# Patient Record
Sex: Male | Born: 1935
Health system: Southern US, Community
[De-identification: ages and names within clinical notes are randomized; demographics above are authoritative.]

## PROBLEM LIST (undated history)

## (undated) DIAGNOSIS — H3530 Unspecified macular degeneration: Secondary | ICD-10-CM

## (undated) DIAGNOSIS — N183 Chronic kidney disease, stage 3 unspecified: Secondary | ICD-10-CM

## (undated) DIAGNOSIS — E039 Hypothyroidism, unspecified: Secondary | ICD-10-CM

## (undated) DIAGNOSIS — J45909 Unspecified asthma, uncomplicated: Secondary | ICD-10-CM

## (undated) DIAGNOSIS — Z85828 Personal history of other malignant neoplasm of skin: Secondary | ICD-10-CM

## (undated) DIAGNOSIS — Z87442 Personal history of urinary calculi: Secondary | ICD-10-CM

## (undated) DIAGNOSIS — I639 Cerebral infarction, unspecified: Secondary | ICD-10-CM

## (undated) DIAGNOSIS — G20A1 Parkinson's disease without dyskinesia, without mention of fluctuations: Secondary | ICD-10-CM

## (undated) DIAGNOSIS — M1990 Unspecified osteoarthritis, unspecified site: Secondary | ICD-10-CM

## (undated) DIAGNOSIS — E119 Type 2 diabetes mellitus without complications: Secondary | ICD-10-CM

## (undated) DIAGNOSIS — K219 Gastro-esophageal reflux disease without esophagitis: Secondary | ICD-10-CM

## (undated) DIAGNOSIS — H9190 Unspecified hearing loss, unspecified ear: Secondary | ICD-10-CM

## (undated) DIAGNOSIS — Z789 Other specified health status: Secondary | ICD-10-CM

## (undated) DIAGNOSIS — G20 Parkinson's disease: Secondary | ICD-10-CM

## (undated) DIAGNOSIS — I480 Paroxysmal atrial fibrillation: Secondary | ICD-10-CM

## (undated) DIAGNOSIS — IMO0001 Reserved for inherently not codable concepts without codable children: Secondary | ICD-10-CM

## (undated) DIAGNOSIS — I2510 Atherosclerotic heart disease of native coronary artery without angina pectoris: Secondary | ICD-10-CM

## (undated) DIAGNOSIS — E785 Hyperlipidemia, unspecified: Secondary | ICD-10-CM

## (undated) DIAGNOSIS — N3281 Overactive bladder: Secondary | ICD-10-CM

## (undated) DIAGNOSIS — C61 Malignant neoplasm of prostate: Secondary | ICD-10-CM

## (undated) DIAGNOSIS — I824Y9 Acute embolism and thrombosis of unspecified deep veins of unspecified proximal lower extremity: Secondary | ICD-10-CM

## (undated) DIAGNOSIS — D649 Anemia, unspecified: Secondary | ICD-10-CM

## (undated) DIAGNOSIS — I10 Essential (primary) hypertension: Secondary | ICD-10-CM

## (undated) HISTORY — DX: Personal history of other malignant neoplasm of skin: Z85.828

## (undated) HISTORY — DX: Hypothyroidism, unspecified: E03.9

## (undated) HISTORY — PX: HERNIA REPAIR: SHX51

## (undated) HISTORY — DX: Type 2 diabetes mellitus without complications: E11.9

## (undated) HISTORY — DX: Chronic kidney disease, stage 3 (moderate): N18.3

## (undated) HISTORY — PX: LUNG REMOVAL, PARTIAL: SHX233

## (undated) HISTORY — DX: Cerebral infarction, unspecified: I63.9

## (undated) HISTORY — PX: COLONOSCOPY W/ POLYPECTOMY: SHX1380

## (undated) HISTORY — DX: Chronic kidney disease, stage 3 unspecified: N18.30

## (undated) HISTORY — DX: Unspecified asthma, uncomplicated: J45.909

## (undated) HISTORY — DX: Paroxysmal atrial fibrillation: I48.0

## (undated) HISTORY — DX: Hyperlipidemia, unspecified: E78.5

## (undated) HISTORY — PX: BACK SURGERY: SHX140

## (undated) HISTORY — DX: Unspecified macular degeneration: H35.30

## (undated) HISTORY — DX: Malignant neoplasm of prostate: C61

## (undated) HISTORY — PX: OTHER SURGICAL HISTORY: SHX169

## (undated) HISTORY — PX: CYSTOSCOPY WITH RETROGRADE PYELOGRAM, URETEROSCOPY AND STENT PLACEMENT: SHX5789

## (undated) HISTORY — PX: EYE SURGERY: SHX253

---

## 1983-01-13 HISTORY — PX: PARTIAL THYMECTOMY: SHX2177

## 1997-07-04 ENCOUNTER — Ambulatory Visit (HOSPITAL_BASED_OUTPATIENT_CLINIC_OR_DEPARTMENT_OTHER): Admission: RE | Admit: 1997-07-04 | Discharge: 1997-07-04 | Payer: Self-pay | Admitting: *Deleted

## 1997-07-31 ENCOUNTER — Ambulatory Visit (HOSPITAL_COMMUNITY): Admission: RE | Admit: 1997-07-31 | Discharge: 1997-07-31 | Payer: Self-pay | Admitting: *Deleted

## 1998-01-30 ENCOUNTER — Ambulatory Visit (HOSPITAL_COMMUNITY): Admission: RE | Admit: 1998-01-30 | Discharge: 1998-01-30 | Payer: Self-pay | Admitting: Specialist

## 1999-03-18 ENCOUNTER — Encounter: Admission: RE | Admit: 1999-03-18 | Discharge: 1999-03-18 | Payer: Self-pay | Admitting: Orthopedic Surgery

## 1999-03-18 ENCOUNTER — Encounter: Payer: Self-pay | Admitting: Orthopedic Surgery

## 1999-03-19 ENCOUNTER — Encounter: Payer: Self-pay | Admitting: *Deleted

## 1999-03-19 ENCOUNTER — Encounter: Admission: RE | Admit: 1999-03-19 | Discharge: 1999-03-19 | Payer: Self-pay | Admitting: *Deleted

## 1999-11-05 ENCOUNTER — Emergency Department (HOSPITAL_COMMUNITY): Admission: EM | Admit: 1999-11-05 | Discharge: 1999-11-06 | Payer: Self-pay | Admitting: Emergency Medicine

## 2000-08-11 ENCOUNTER — Inpatient Hospital Stay (HOSPITAL_COMMUNITY): Admission: EM | Admit: 2000-08-11 | Discharge: 2000-08-14 | Payer: Self-pay | Admitting: Internal Medicine

## 2000-08-11 ENCOUNTER — Encounter: Payer: Self-pay | Admitting: *Deleted

## 2000-08-13 ENCOUNTER — Encounter: Payer: Self-pay | Admitting: *Deleted

## 2000-12-01 ENCOUNTER — Encounter: Payer: Self-pay | Admitting: Internal Medicine

## 2000-12-02 ENCOUNTER — Encounter: Payer: Self-pay | Admitting: *Deleted

## 2000-12-02 ENCOUNTER — Inpatient Hospital Stay (HOSPITAL_COMMUNITY): Admission: EM | Admit: 2000-12-02 | Discharge: 2000-12-03 | Payer: Self-pay | Admitting: *Deleted

## 2002-11-02 ENCOUNTER — Ambulatory Visit (HOSPITAL_COMMUNITY): Admission: RE | Admit: 2002-11-02 | Discharge: 2002-11-02 | Payer: Self-pay | Admitting: Internal Medicine

## 2002-11-12 ENCOUNTER — Emergency Department (HOSPITAL_COMMUNITY): Admission: EM | Admit: 2002-11-12 | Discharge: 2002-11-12 | Payer: Self-pay | Admitting: Emergency Medicine

## 2003-01-13 HISTORY — PX: POPLITEAL SYNOVIAL CYST EXCISION: SUR555

## 2003-07-11 ENCOUNTER — Encounter (INDEPENDENT_AMBULATORY_CARE_PROVIDER_SITE_OTHER): Payer: Self-pay | Admitting: Specialist

## 2003-07-11 ENCOUNTER — Ambulatory Visit (HOSPITAL_COMMUNITY): Admission: RE | Admit: 2003-07-11 | Discharge: 2003-07-11 | Payer: Self-pay | Admitting: Gastroenterology

## 2003-10-17 ENCOUNTER — Ambulatory Visit (HOSPITAL_COMMUNITY): Admission: RE | Admit: 2003-10-17 | Discharge: 2003-10-17 | Payer: Self-pay | Admitting: Gastroenterology

## 2003-10-17 ENCOUNTER — Encounter (INDEPENDENT_AMBULATORY_CARE_PROVIDER_SITE_OTHER): Payer: Self-pay | Admitting: *Deleted

## 2003-11-27 ENCOUNTER — Ambulatory Visit: Payer: Self-pay | Admitting: Internal Medicine

## 2004-01-13 HISTORY — PX: GALLBLADDER SURGERY: SHX652

## 2004-02-01 ENCOUNTER — Ambulatory Visit: Payer: Self-pay | Admitting: Internal Medicine

## 2004-02-12 ENCOUNTER — Ambulatory Visit: Payer: Self-pay | Admitting: Internal Medicine

## 2004-02-25 ENCOUNTER — Ambulatory Visit (HOSPITAL_COMMUNITY): Admission: RE | Admit: 2004-02-25 | Discharge: 2004-02-25 | Payer: Self-pay | Admitting: Urology

## 2004-03-03 ENCOUNTER — Ambulatory Visit: Payer: Self-pay | Admitting: Internal Medicine

## 2004-04-01 ENCOUNTER — Ambulatory Visit: Payer: Self-pay | Admitting: Internal Medicine

## 2004-04-22 ENCOUNTER — Ambulatory Visit: Payer: Self-pay | Admitting: Internal Medicine

## 2004-05-05 ENCOUNTER — Ambulatory Visit: Payer: Self-pay | Admitting: Internal Medicine

## 2004-06-04 ENCOUNTER — Ambulatory Visit: Payer: Self-pay | Admitting: Internal Medicine

## 2004-07-07 ENCOUNTER — Ambulatory Visit: Payer: Self-pay | Admitting: Internal Medicine

## 2004-07-24 ENCOUNTER — Ambulatory Visit: Payer: Self-pay | Admitting: Internal Medicine

## 2004-08-04 ENCOUNTER — Ambulatory Visit: Payer: Self-pay | Admitting: Internal Medicine

## 2004-09-01 ENCOUNTER — Ambulatory Visit: Payer: Self-pay | Admitting: Internal Medicine

## 2004-10-21 ENCOUNTER — Ambulatory Visit: Payer: Self-pay | Admitting: Internal Medicine

## 2005-02-03 ENCOUNTER — Ambulatory Visit: Payer: Self-pay | Admitting: Internal Medicine

## 2005-02-27 ENCOUNTER — Ambulatory Visit: Payer: Self-pay | Admitting: Internal Medicine

## 2005-03-11 ENCOUNTER — Ambulatory Visit: Payer: Self-pay | Admitting: Internal Medicine

## 2005-06-15 ENCOUNTER — Ambulatory Visit: Payer: Self-pay | Admitting: Internal Medicine

## 2005-06-23 ENCOUNTER — Ambulatory Visit: Payer: Self-pay | Admitting: Internal Medicine

## 2005-08-28 ENCOUNTER — Ambulatory Visit: Admission: RE | Admit: 2005-08-28 | Discharge: 2005-11-26 | Payer: Self-pay | Admitting: Radiation Oncology

## 2005-09-03 ENCOUNTER — Ambulatory Visit: Payer: Self-pay | Admitting: Internal Medicine

## 2005-10-05 ENCOUNTER — Ambulatory Visit: Payer: Self-pay | Admitting: Internal Medicine

## 2005-10-14 ENCOUNTER — Ambulatory Visit: Payer: Self-pay | Admitting: Internal Medicine

## 2005-11-04 ENCOUNTER — Ambulatory Visit: Payer: Self-pay | Admitting: Internal Medicine

## 2005-11-27 ENCOUNTER — Ambulatory Visit: Admission: RE | Admit: 2005-11-27 | Discharge: 2006-02-25 | Payer: Self-pay | Admitting: Radiation Oncology

## 2006-01-12 HISTORY — PX: TOTAL KNEE ARTHROPLASTY: SHX125

## 2006-01-21 ENCOUNTER — Inpatient Hospital Stay (HOSPITAL_COMMUNITY): Admission: RE | Admit: 2006-01-21 | Discharge: 2006-01-25 | Payer: Self-pay | Admitting: Orthopedic Surgery

## 2006-02-13 ENCOUNTER — Encounter: Payer: Self-pay | Admitting: Internal Medicine

## 2006-02-26 ENCOUNTER — Ambulatory Visit: Payer: Self-pay | Admitting: Internal Medicine

## 2006-02-26 LAB — CONVERTED CEMR LAB
Cholesterol: 165 mg/dL (ref 0–200)
HDL: 36.9 mg/dL — ABNORMAL LOW (ref >39.0)
Hgb A1c MFr Bld: 5.2 % (ref 4.6–6.0)
LDL Cholesterol: 108 mg/dL — ABNORMAL HIGH (ref 0–99)
TSH: 14.35 u[IU]/mL — ABNORMAL HIGH (ref 0.35–5.50)
Total CHOL/HDL Ratio: 4.5
Triglycerides: 99 mg/dL (ref 0–149)
VLDL: 20 mg/dL (ref 0–40)

## 2006-03-10 ENCOUNTER — Ambulatory Visit: Payer: Self-pay | Admitting: Internal Medicine

## 2006-04-20 ENCOUNTER — Ambulatory Visit: Payer: Self-pay | Admitting: Internal Medicine

## 2006-04-22 DIAGNOSIS — L509 Urticaria, unspecified: Secondary | ICD-10-CM | POA: Insufficient documentation

## 2006-04-22 DIAGNOSIS — Z9889 Other specified postprocedural states: Secondary | ICD-10-CM

## 2006-04-22 DIAGNOSIS — E119 Type 2 diabetes mellitus without complications: Secondary | ICD-10-CM

## 2006-04-22 DIAGNOSIS — Z794 Long term (current) use of insulin: Secondary | ICD-10-CM

## 2006-04-22 DIAGNOSIS — M7120 Synovial cyst of popliteal space [Baker], unspecified knee: Secondary | ICD-10-CM | POA: Insufficient documentation

## 2006-04-27 ENCOUNTER — Ambulatory Visit: Payer: Self-pay | Admitting: Internal Medicine

## 2006-04-27 LAB — CONVERTED CEMR LAB
AST: 16 units/L (ref 0–37)
BUN: 20 mg/dL (ref 6–23)
Basophils Relative: 0.7 % (ref 0.0–1.0)
Creatinine, Ser: 1 mg/dL (ref 0.4–1.5)
Eosinophils Relative: 2.2 % (ref 0.0–5.0)
HCT: 40.8 % (ref 39.0–52.0)
Platelets: 256 10*3/uL (ref 150–400)
RBC: 4.53 M/uL (ref 4.22–5.81)
RDW: 12 % (ref 11.5–14.6)
TSH: 0.15 microintl units/mL — ABNORMAL LOW (ref 0.35–5.50)
WBC: 8.1 10*3/uL (ref 4.5–10.5)

## 2006-05-06 ENCOUNTER — Ambulatory Visit: Payer: Self-pay | Admitting: Internal Medicine

## 2006-06-14 ENCOUNTER — Ambulatory Visit: Payer: Self-pay | Admitting: Internal Medicine

## 2006-06-15 LAB — CONVERTED CEMR LAB: TSH: 0.05 microintl units/mL — ABNORMAL LOW (ref 0.35–5.50)

## 2006-06-21 ENCOUNTER — Ambulatory Visit: Payer: Self-pay | Admitting: Internal Medicine

## 2006-07-12 ENCOUNTER — Telehealth: Payer: Self-pay | Admitting: Internal Medicine

## 2006-07-12 ENCOUNTER — Encounter (INDEPENDENT_AMBULATORY_CARE_PROVIDER_SITE_OTHER): Payer: Self-pay | Admitting: *Deleted

## 2006-07-16 ENCOUNTER — Encounter: Payer: Self-pay | Admitting: Internal Medicine

## 2006-07-22 ENCOUNTER — Ambulatory Visit: Payer: Self-pay | Admitting: Internal Medicine

## 2006-07-26 ENCOUNTER — Encounter (INDEPENDENT_AMBULATORY_CARE_PROVIDER_SITE_OTHER): Payer: Self-pay | Admitting: *Deleted

## 2006-09-28 ENCOUNTER — Telehealth (INDEPENDENT_AMBULATORY_CARE_PROVIDER_SITE_OTHER): Payer: Self-pay | Admitting: *Deleted

## 2006-09-29 ENCOUNTER — Ambulatory Visit: Payer: Self-pay | Admitting: Internal Medicine

## 2006-10-04 ENCOUNTER — Encounter (INDEPENDENT_AMBULATORY_CARE_PROVIDER_SITE_OTHER): Payer: Self-pay | Admitting: *Deleted

## 2006-10-15 ENCOUNTER — Ambulatory Visit: Payer: Self-pay | Admitting: Internal Medicine

## 2006-10-15 DIAGNOSIS — E039 Hypothyroidism, unspecified: Secondary | ICD-10-CM

## 2006-10-27 ENCOUNTER — Ambulatory Visit: Payer: Self-pay | Admitting: Internal Medicine

## 2006-11-16 ENCOUNTER — Telehealth (INDEPENDENT_AMBULATORY_CARE_PROVIDER_SITE_OTHER): Payer: Self-pay | Admitting: *Deleted

## 2007-01-14 ENCOUNTER — Ambulatory Visit: Payer: Self-pay | Admitting: Internal Medicine

## 2007-01-16 LAB — CONVERTED CEMR LAB: Digitoxin Lvl: 1.1 ng/mL (ref 0.8–2.0)

## 2007-01-17 ENCOUNTER — Ambulatory Visit: Payer: Self-pay | Admitting: Internal Medicine

## 2007-01-17 ENCOUNTER — Encounter (INDEPENDENT_AMBULATORY_CARE_PROVIDER_SITE_OTHER): Payer: Self-pay | Admitting: *Deleted

## 2007-01-18 ENCOUNTER — Encounter (INDEPENDENT_AMBULATORY_CARE_PROVIDER_SITE_OTHER): Payer: Self-pay | Admitting: *Deleted

## 2007-01-18 LAB — CONVERTED CEMR LAB: Hgb A1c MFr Bld: 5.8 %

## 2007-01-21 ENCOUNTER — Ambulatory Visit: Payer: Self-pay | Admitting: Internal Medicine

## 2007-01-27 ENCOUNTER — Telehealth: Payer: Self-pay | Admitting: Internal Medicine

## 2007-02-03 ENCOUNTER — Encounter: Payer: Self-pay | Admitting: Internal Medicine

## 2007-02-15 ENCOUNTER — Encounter: Payer: Self-pay | Admitting: Internal Medicine

## 2007-04-07 ENCOUNTER — Encounter (INDEPENDENT_AMBULATORY_CARE_PROVIDER_SITE_OTHER): Payer: Self-pay | Admitting: General Surgery

## 2007-04-07 ENCOUNTER — Ambulatory Visit (HOSPITAL_COMMUNITY): Admission: RE | Admit: 2007-04-07 | Discharge: 2007-04-07 | Payer: Self-pay | Admitting: General Surgery

## 2007-04-11 ENCOUNTER — Ambulatory Visit: Payer: Self-pay | Admitting: Internal Medicine

## 2007-04-19 ENCOUNTER — Encounter: Payer: Self-pay | Admitting: Internal Medicine

## 2007-05-02 ENCOUNTER — Encounter: Payer: Self-pay | Admitting: Internal Medicine

## 2007-06-02 ENCOUNTER — Telehealth (INDEPENDENT_AMBULATORY_CARE_PROVIDER_SITE_OTHER): Payer: Self-pay | Admitting: *Deleted

## 2007-06-04 ENCOUNTER — Emergency Department (HOSPITAL_COMMUNITY): Admission: EM | Admit: 2007-06-04 | Discharge: 2007-06-04 | Payer: Self-pay | Admitting: Emergency Medicine

## 2007-06-07 ENCOUNTER — Telehealth (INDEPENDENT_AMBULATORY_CARE_PROVIDER_SITE_OTHER): Payer: Self-pay | Admitting: *Deleted

## 2007-06-08 ENCOUNTER — Telehealth (INDEPENDENT_AMBULATORY_CARE_PROVIDER_SITE_OTHER): Payer: Self-pay | Admitting: *Deleted

## 2007-06-13 ENCOUNTER — Telehealth (INDEPENDENT_AMBULATORY_CARE_PROVIDER_SITE_OTHER): Payer: Self-pay | Admitting: *Deleted

## 2007-06-16 ENCOUNTER — Telehealth (INDEPENDENT_AMBULATORY_CARE_PROVIDER_SITE_OTHER): Payer: Self-pay | Admitting: *Deleted

## 2007-06-17 ENCOUNTER — Ambulatory Visit: Payer: Self-pay | Admitting: Internal Medicine

## 2007-06-24 ENCOUNTER — Telehealth: Payer: Self-pay | Admitting: Internal Medicine

## 2007-06-27 ENCOUNTER — Ambulatory Visit: Payer: Self-pay | Admitting: Internal Medicine

## 2007-06-27 ENCOUNTER — Telehealth (INDEPENDENT_AMBULATORY_CARE_PROVIDER_SITE_OTHER): Payer: Self-pay | Admitting: *Deleted

## 2007-07-12 ENCOUNTER — Telehealth (INDEPENDENT_AMBULATORY_CARE_PROVIDER_SITE_OTHER): Payer: Self-pay | Admitting: *Deleted

## 2007-07-18 ENCOUNTER — Ambulatory Visit: Payer: Self-pay | Admitting: Internal Medicine

## 2007-07-25 ENCOUNTER — Encounter (INDEPENDENT_AMBULATORY_CARE_PROVIDER_SITE_OTHER): Payer: Self-pay | Admitting: *Deleted

## 2007-07-25 LAB — CONVERTED CEMR LAB
Albumin: 3.5 g/dL (ref 3.5–5.2)
Alkaline Phosphatase: 71 units/L (ref 39–117)
Cholesterol: 190 mg/dL (ref 0–200)
Creatinine, Ser: 1.1 mg/dL (ref 0.4–1.5)
Creatinine,U: 180.2 mg/dL
HDL: 38.9 mg/dL — ABNORMAL LOW (ref >39.0)
LDL Cholesterol: 134 mg/dL — ABNORMAL HIGH (ref 0–99)
Microalb Creat Ratio: 5.5 mg/g (ref 0.0–30.0)
Total Protein: 6 g/dL (ref 6.0–8.3)
Triglycerides: 84 mg/dL (ref 0–149)
VLDL: 17 mg/dL (ref 0–40)

## 2007-08-02 ENCOUNTER — Ambulatory Visit: Payer: Self-pay | Admitting: Internal Medicine

## 2007-08-02 LAB — CONVERTED CEMR LAB
Cholesterol, target level: 200 mg/dL
HDL goal, serum: 40 mg/dL

## 2007-08-08 ENCOUNTER — Encounter (INDEPENDENT_AMBULATORY_CARE_PROVIDER_SITE_OTHER): Payer: Self-pay | Admitting: *Deleted

## 2007-08-08 LAB — CONVERTED CEMR LAB
Eosinophils Relative: 3.2 % (ref 0.0–5.0)
HCT: 42.4 % (ref 39.0–52.0)
Monocytes Relative: 10.4 % (ref 3.0–12.0)
Neutrophils Relative %: 61.1 % (ref 43.0–77.0)
Platelets: 233 10*3/uL (ref 150–400)
WBC: 6.9 10*3/uL (ref 4.5–10.5)

## 2007-08-17 ENCOUNTER — Ambulatory Visit: Payer: Self-pay | Admitting: Internal Medicine

## 2007-08-17 LAB — CONVERTED CEMR LAB
OCCULT 1: NEGATIVE
OCCULT 2: NEGATIVE

## 2007-08-18 ENCOUNTER — Encounter (INDEPENDENT_AMBULATORY_CARE_PROVIDER_SITE_OTHER): Payer: Self-pay | Admitting: *Deleted

## 2007-08-24 ENCOUNTER — Emergency Department (HOSPITAL_COMMUNITY): Admission: EM | Admit: 2007-08-24 | Discharge: 2007-08-24 | Payer: Self-pay | Admitting: Emergency Medicine

## 2007-08-24 ENCOUNTER — Encounter: Payer: Self-pay | Admitting: Internal Medicine

## 2007-10-03 ENCOUNTER — Ambulatory Visit: Payer: Self-pay | Admitting: Internal Medicine

## 2007-10-10 ENCOUNTER — Encounter (INDEPENDENT_AMBULATORY_CARE_PROVIDER_SITE_OTHER): Payer: Self-pay | Admitting: *Deleted

## 2007-10-10 LAB — CONVERTED CEMR LAB: Hgb A1c MFr Bld: 6.2 % — ABNORMAL HIGH (ref 4.6–6.0)

## 2007-10-14 ENCOUNTER — Encounter: Payer: Self-pay | Admitting: Internal Medicine

## 2007-10-18 ENCOUNTER — Telehealth: Payer: Self-pay | Admitting: Internal Medicine

## 2007-10-25 ENCOUNTER — Encounter: Payer: Self-pay | Admitting: Internal Medicine

## 2007-12-07 ENCOUNTER — Encounter: Payer: Self-pay | Admitting: Internal Medicine

## 2007-12-14 ENCOUNTER — Telehealth (INDEPENDENT_AMBULATORY_CARE_PROVIDER_SITE_OTHER): Payer: Self-pay | Admitting: *Deleted

## 2007-12-20 ENCOUNTER — Encounter (INDEPENDENT_AMBULATORY_CARE_PROVIDER_SITE_OTHER): Payer: Self-pay | Admitting: *Deleted

## 2008-01-02 ENCOUNTER — Telehealth (INDEPENDENT_AMBULATORY_CARE_PROVIDER_SITE_OTHER): Payer: Self-pay | Admitting: *Deleted

## 2008-01-12 ENCOUNTER — Ambulatory Visit: Payer: Self-pay | Admitting: Internal Medicine

## 2008-01-12 DIAGNOSIS — M1710 Unilateral primary osteoarthritis, unspecified knee: Secondary | ICD-10-CM | POA: Insufficient documentation

## 2008-01-13 HISTORY — PX: TOTAL KNEE ARTHROPLASTY: SHX125

## 2008-01-14 LAB — CONVERTED CEMR LAB
AST: 19 units/L (ref 0–37)
Albumin: 3.9 g/dL (ref 3.5–5.2)
Alkaline Phosphatase: 86 units/L (ref 39–117)
Cholesterol: 220 mg/dL (ref 0–200)
Creatinine, Ser: 1.1 mg/dL (ref 0.4–1.5)
Direct LDL: 153.4 mg/dL
Total Protein: 7.1 g/dL (ref 6.0–8.3)

## 2008-01-16 ENCOUNTER — Encounter (INDEPENDENT_AMBULATORY_CARE_PROVIDER_SITE_OTHER): Payer: Self-pay | Admitting: *Deleted

## 2008-01-16 ENCOUNTER — Telehealth (INDEPENDENT_AMBULATORY_CARE_PROVIDER_SITE_OTHER): Payer: Self-pay | Admitting: *Deleted

## 2008-01-17 ENCOUNTER — Telehealth (INDEPENDENT_AMBULATORY_CARE_PROVIDER_SITE_OTHER): Payer: Self-pay | Admitting: *Deleted

## 2008-01-18 ENCOUNTER — Inpatient Hospital Stay (HOSPITAL_COMMUNITY): Admission: RE | Admit: 2008-01-18 | Discharge: 2008-01-24 | Payer: Self-pay | Admitting: Orthopedic Surgery

## 2008-02-07 ENCOUNTER — Telehealth (INDEPENDENT_AMBULATORY_CARE_PROVIDER_SITE_OTHER): Payer: Self-pay | Admitting: *Deleted

## 2008-02-21 ENCOUNTER — Ambulatory Visit: Payer: Self-pay | Admitting: Internal Medicine

## 2008-02-21 DIAGNOSIS — K5909 Other constipation: Secondary | ICD-10-CM | POA: Insufficient documentation

## 2008-04-11 ENCOUNTER — Encounter: Admission: RE | Admit: 2008-04-11 | Discharge: 2008-07-10 | Payer: Self-pay | Admitting: Ophthalmology

## 2008-04-12 ENCOUNTER — Ambulatory Visit: Payer: Self-pay | Admitting: Internal Medicine

## 2008-06-08 ENCOUNTER — Encounter: Payer: Self-pay | Admitting: Internal Medicine

## 2008-07-11 ENCOUNTER — Ambulatory Visit: Payer: Self-pay | Admitting: Internal Medicine

## 2008-07-20 ENCOUNTER — Encounter: Payer: Self-pay | Admitting: Internal Medicine

## 2008-07-24 ENCOUNTER — Encounter (INDEPENDENT_AMBULATORY_CARE_PROVIDER_SITE_OTHER): Payer: Self-pay | Admitting: *Deleted

## 2008-07-24 LAB — CONVERTED CEMR LAB
Albumin: 3.8 g/dL (ref 3.5–5.2)
Alkaline Phosphatase: 76 units/L (ref 39–117)
Creatinine, Ser: 1.1 mg/dL (ref 0.4–1.5)
Creatinine,U: 201.1 mg/dL
Direct LDL: 152.5 mg/dL
HDL: 33.1 mg/dL — ABNORMAL LOW (ref >39.00)
Hgb A1c MFr Bld: 6.1 % (ref 4.6–6.5)
Microalb Creat Ratio: 9.9 mg/g (ref 0.0–30.0)
Potassium: 4.2 meq/L (ref 3.5–5.1)
Total Protein: 6.4 g/dL (ref 6.0–8.3)
Triglycerides: 133 mg/dL (ref 0.0–149.0)

## 2008-09-06 ENCOUNTER — Ambulatory Visit: Payer: Self-pay | Admitting: Internal Medicine

## 2008-09-14 ENCOUNTER — Encounter: Payer: Self-pay | Admitting: Internal Medicine

## 2008-09-25 ENCOUNTER — Telehealth: Payer: Self-pay | Admitting: Internal Medicine

## 2008-10-02 ENCOUNTER — Ambulatory Visit: Payer: Self-pay | Admitting: Internal Medicine

## 2008-10-18 ENCOUNTER — Ambulatory Visit: Payer: Self-pay | Admitting: Internal Medicine

## 2008-12-03 ENCOUNTER — Ambulatory Visit: Payer: Self-pay | Admitting: Internal Medicine

## 2008-12-03 ENCOUNTER — Telehealth: Payer: Self-pay | Admitting: Internal Medicine

## 2008-12-04 ENCOUNTER — Telehealth (INDEPENDENT_AMBULATORY_CARE_PROVIDER_SITE_OTHER): Payer: Self-pay | Admitting: *Deleted

## 2008-12-04 ENCOUNTER — Ambulatory Visit: Payer: Self-pay | Admitting: Internal Medicine

## 2008-12-10 ENCOUNTER — Telehealth (INDEPENDENT_AMBULATORY_CARE_PROVIDER_SITE_OTHER): Payer: Self-pay | Admitting: *Deleted

## 2008-12-25 ENCOUNTER — Telehealth (INDEPENDENT_AMBULATORY_CARE_PROVIDER_SITE_OTHER): Payer: Self-pay | Admitting: *Deleted

## 2008-12-25 ENCOUNTER — Ambulatory Visit: Payer: Self-pay | Admitting: Internal Medicine

## 2008-12-28 ENCOUNTER — Telehealth: Payer: Self-pay | Admitting: Internal Medicine

## 2008-12-31 ENCOUNTER — Telehealth (INDEPENDENT_AMBULATORY_CARE_PROVIDER_SITE_OTHER): Payer: Self-pay | Admitting: *Deleted

## 2008-12-31 ENCOUNTER — Ambulatory Visit: Payer: Self-pay | Admitting: Internal Medicine

## 2009-01-06 LAB — CONVERTED CEMR LAB
HDL: 43.7 mg/dL (ref >39.00)
Hgb A1c MFr Bld: 6.5 % (ref 4.6–6.5)

## 2009-01-07 ENCOUNTER — Encounter (INDEPENDENT_AMBULATORY_CARE_PROVIDER_SITE_OTHER): Payer: Self-pay | Admitting: *Deleted

## 2009-01-21 ENCOUNTER — Ambulatory Visit: Payer: Self-pay | Admitting: Internal Medicine

## 2009-01-29 ENCOUNTER — Telehealth (INDEPENDENT_AMBULATORY_CARE_PROVIDER_SITE_OTHER): Payer: Self-pay | Admitting: *Deleted

## 2009-03-01 ENCOUNTER — Encounter: Payer: Self-pay | Admitting: Internal Medicine

## 2009-03-06 ENCOUNTER — Ambulatory Visit: Payer: Self-pay | Admitting: Internal Medicine

## 2009-03-06 ENCOUNTER — Telehealth (INDEPENDENT_AMBULATORY_CARE_PROVIDER_SITE_OTHER): Payer: Self-pay | Admitting: *Deleted

## 2009-03-26 ENCOUNTER — Telehealth (INDEPENDENT_AMBULATORY_CARE_PROVIDER_SITE_OTHER): Payer: Self-pay | Admitting: *Deleted

## 2009-04-26 ENCOUNTER — Telehealth: Payer: Self-pay | Admitting: Internal Medicine

## 2009-06-14 ENCOUNTER — Telehealth (INDEPENDENT_AMBULATORY_CARE_PROVIDER_SITE_OTHER): Payer: Self-pay | Admitting: *Deleted

## 2009-06-16 ENCOUNTER — Encounter: Payer: Self-pay | Admitting: Internal Medicine

## 2009-06-18 ENCOUNTER — Encounter: Payer: Self-pay | Admitting: Internal Medicine

## 2009-07-17 ENCOUNTER — Ambulatory Visit: Payer: Self-pay | Admitting: Internal Medicine

## 2009-07-17 LAB — CONVERTED CEMR LAB
ALT: 10 units/L (ref 0–53)
Albumin: 4 g/dL (ref 3.5–5.2)
Alkaline Phosphatase: 67 units/L (ref 39–117)
BUN: 17 mg/dL (ref 6–23)
Cholesterol: 210 mg/dL — ABNORMAL HIGH (ref 0–200)
Creatinine, Ser: 1.1 mg/dL (ref 0.4–1.5)
Creatinine,U: 217.4 mg/dL
Digitoxin Lvl: 1.5 ng/mL (ref 0.8–2.0)
Direct LDL: 147.5 mg/dL
HDL: 40.2 mg/dL (ref >39.00)
Microalb Creat Ratio: 1.4 mg/g (ref 0.0–30.0)
Microalb, Ur: 3.1 mg/dL — ABNORMAL HIGH (ref 0.0–1.9)
Potassium: 4.5 meq/L (ref 3.5–5.1)
Triglycerides: 133 mg/dL (ref 0.0–149.0)
VLDL: 26.6 mg/dL (ref 0.0–40.0)

## 2009-07-24 ENCOUNTER — Ambulatory Visit: Payer: Self-pay | Admitting: Internal Medicine

## 2009-08-12 ENCOUNTER — Telehealth (INDEPENDENT_AMBULATORY_CARE_PROVIDER_SITE_OTHER): Payer: Self-pay | Admitting: *Deleted

## 2009-08-19 ENCOUNTER — Ambulatory Visit: Payer: Self-pay | Admitting: Internal Medicine

## 2009-08-23 ENCOUNTER — Ambulatory Visit: Payer: Self-pay | Admitting: Internal Medicine

## 2009-09-27 ENCOUNTER — Ambulatory Visit: Payer: Self-pay | Admitting: Pulmonary Disease

## 2009-11-05 ENCOUNTER — Telehealth (INDEPENDENT_AMBULATORY_CARE_PROVIDER_SITE_OTHER): Payer: Self-pay | Admitting: *Deleted

## 2009-11-06 ENCOUNTER — Telehealth (INDEPENDENT_AMBULATORY_CARE_PROVIDER_SITE_OTHER): Payer: Self-pay | Admitting: *Deleted

## 2009-11-14 ENCOUNTER — Encounter: Payer: Self-pay | Admitting: Internal Medicine

## 2009-11-20 ENCOUNTER — Telehealth: Payer: Self-pay | Admitting: Internal Medicine

## 2009-11-22 ENCOUNTER — Telehealth (INDEPENDENT_AMBULATORY_CARE_PROVIDER_SITE_OTHER): Payer: Self-pay | Admitting: *Deleted

## 2009-11-25 ENCOUNTER — Ambulatory Visit: Payer: Self-pay | Admitting: Internal Medicine

## 2009-11-25 ENCOUNTER — Telehealth (INDEPENDENT_AMBULATORY_CARE_PROVIDER_SITE_OTHER): Payer: Self-pay | Admitting: *Deleted

## 2009-11-25 LAB — CONVERTED CEMR LAB: Digitoxin Lvl: 2 ng/mL (ref 0.8–2.0)

## 2009-11-27 LAB — CONVERTED CEMR LAB
Cholesterol: 211 mg/dL — ABNORMAL HIGH (ref 0–200)
HDL: 37.4 mg/dL — ABNORMAL LOW (ref >39.00)
Triglycerides: 156 mg/dL — ABNORMAL HIGH (ref 0.0–149.0)

## 2009-12-02 ENCOUNTER — Ambulatory Visit: Payer: Self-pay | Admitting: Internal Medicine

## 2009-12-13 ENCOUNTER — Ambulatory Visit: Payer: Self-pay | Admitting: Cardiology

## 2009-12-13 ENCOUNTER — Observation Stay (HOSPITAL_COMMUNITY)
Admission: EM | Admit: 2009-12-13 | Discharge: 2009-12-14 | Payer: Self-pay | Source: Home / Self Care | Admitting: Emergency Medicine

## 2009-12-16 ENCOUNTER — Telehealth (INDEPENDENT_AMBULATORY_CARE_PROVIDER_SITE_OTHER): Payer: Self-pay | Admitting: *Deleted

## 2009-12-17 ENCOUNTER — Ambulatory Visit: Payer: Self-pay | Admitting: Internal Medicine

## 2009-12-23 ENCOUNTER — Telehealth: Payer: Self-pay | Admitting: Internal Medicine

## 2009-12-25 ENCOUNTER — Ambulatory Visit: Payer: Self-pay | Admitting: Internal Medicine

## 2009-12-25 ENCOUNTER — Telehealth: Payer: Self-pay | Admitting: Internal Medicine

## 2009-12-26 LAB — CONVERTED CEMR LAB
Basophils Relative: 0.9 % (ref 0.0–3.0)
Eosinophils Relative: 1.6 % (ref 0.0–5.0)
HCT: 44.3 % (ref 39.0–52.0)
Lymphs Abs: 1.9 10*3/uL (ref 0.7–4.0)
MCV: 94.8 fL (ref 78.0–100.0)
Monocytes Absolute: 0.7 10*3/uL (ref 0.1–1.0)
Monocytes Relative: 8.5 % (ref 3.0–12.0)
RBC: 4.67 M/uL (ref 4.22–5.81)
WBC: 8.7 10*3/uL (ref 4.5–10.5)

## 2010-01-08 ENCOUNTER — Encounter: Payer: Self-pay | Admitting: Internal Medicine

## 2010-02-07 ENCOUNTER — Telehealth (INDEPENDENT_AMBULATORY_CARE_PROVIDER_SITE_OTHER): Payer: Self-pay | Admitting: *Deleted

## 2010-02-10 ENCOUNTER — Telehealth (INDEPENDENT_AMBULATORY_CARE_PROVIDER_SITE_OTHER): Payer: Self-pay | Admitting: *Deleted

## 2010-02-13 NOTE — Progress Notes (Signed)
Summary: COUGH/ RUNNY NOSE  Phone Note Call from Patient Call back at [phone]   Caller: Patient Call For: [name] Summary of Call: PT COUGHING [facility] NOSE. [facility] [address] Initial call taken by: Rickard Patience,  November 20, 2009 8:42 AM  Follow-up for Phone Call        The patient c/o cough with yellow to green mucus and yellow nasal drainage for 3-4 days. Throat is scratchy. Denies SOB or fever. Slight headache. Please advise.Michel Bickers CMA  November 20, 2009 8:59 AM Allergies:  1)  ! Codeine 2)  ! * Tetanus 3)  ! Zocor 4)  ! Lipitor 5)  ! Zetia 6)  ! Crestor 7)  ! Percocet 8)  ! * Nitro 9)  ! * Dyes 10)  * Iv Dye  Additional Follow-up for Phone Call Additional follow up Details #1::        Per CDY-okay to give patient Zpak #1 take as directed no refills.Reynaldo Minium CMA  November 20, 2009 9:26 AM   rx sent. pt aware.Carron Curie CMA  November 20, 2009 9:28 AM     New/Updated Medications: ZITHROMAX Z-PAK 250 MG TABS (AZITHROMYCIN) as directed Prescriptions: ZITHROMAX Z-PAK 250 MG TABS (AZITHROMYCIN) as directed  #1 pak x 0   Entered by:   Carron Curie CMA   Authorized by:   Waymon Budge MD   Signed by:   Carron Curie CMA on 11/20/2009   Method used:   Electronically to        [facility] [address] [phone]* (retail)       [address]       [place], [place]  [address]       Ph: [phone]       Fax: [phone]   RxID:   [id number]

## 2010-02-13 NOTE — Assessment & Plan Note (Signed)
Summary: 6 MONTH FOLLOWUP///SPH   Vital Signs:  Patient profile:   77 year old male Weight:      175.6 pounds Pulse rate:   76 / minute Resp:     16 per minute BP sitting:   140 / 82  (left arm) Cuff size:   regular  Vitals Entered By: Shonna Chock CMA (July 24, 2009 1:08 PM) CC: 6 month follow-up, Type 2 diabetes mellitus follow-up Comments REVIEWED MED LIST, PATIENT AGREED DOSE AND INSTRUCTION CORRECT    Primary Care Provider:  Rockie Neighbours  CC:  6 month follow-up and Type 2 diabetes mellitus follow-up.  History of Present Illness: Labs reviewed & risks discussed .Hypothyroidism suboptimally addressed; he has fatigue & paresthesias of LUE especially @ night. Type 2 Diabetes Mellitus Follow-Up      This is a 77 year old man who presents for Type 2 diabetes mellitus follow-up.  The patient reports numbness of L hand, but denies polyuria, polydipsia, blurred vision, self managed hypoglycemia, weight loss, and weight gain.  The patient denies the following symptoms: neuropathic pain, chest pain, vomiting, orthostatic symptoms, poor wound healing, intermittent claudication, vision loss, and foot ulcer.  Since the last visit the patient reports poor dietary compliance, compliance with medications, and monitoring blood glucose.  The patient has been measuring capillary blood glucose before breakfast and before dinner.  FBS average 90-105.Pre meal 115. Since the last visit, the patient reports having had eye care by an ophthalmologist  (cataract surgery)and no foot care. Podiatry referral declined.   Hyperlipidemia Follow-Up      The patient also presents for Hyperlipidemia follow-up.  The patient reports itching intermittently from isolated hives, but denies muscle aches, GI upset, abdominal pain, flushing, constipation, and diarrhea.  The patient denies the following symptoms: exercise intolerance, dypsnea, palpitations, syncope, and pedal edema.  Compliance with medications (by patient report)  has been near 100%.  The patient reports exercising daily for 15 minutes as calisthentics.  Adjunctive measures currently used by the patient include ASA.  LDL not @ goal but he has been statin & Zetia intolerant.  Allergies: 1)  ! Codeine 2)  ! * Tetanus 3)  ! Zocor 4)  ! Lipitor 5)  ! Zetia 6)  ! Crestor 7)  ! Percocet 8)  ! * Nitro 9)  ! * Dyes 10)  * Iv Dye  Review of Systems Eyes:  Denies double vision and vision loss-both eyes. ENT:  Denies difficulty swallowing and hoarseness. GU:  Complains of incontinence; Procedure to be started by Dr Vonita Moss for incontinence; this will last 12 weeks. Derm:  Denies changes in nail beds, dryness, and hair loss. Psych:  Complains of anxiety; denies depression; Trying to sell his home.  Physical Exam  General:  Appears younger than age; alert,appropriate and cooperative throughout examination Neck:  No deformities, masses, or tenderness noted.Thyroid small Lungs:  Normal respiratory effort, chest expands symmetrically. Lungs are clear to auscultation, no crackles or wheezes. Heart:  normal rate, regular rhythm, no gallop, no rub, no JVD, and grade 1 /6 systolic murmur.   Pulses:  R and L carotid,radial,dorsalis pedis and posterior tibial pulses are full and equal bilaterally Extremities:  No clubbing, cyanosis, edema. Deformed R  great nail. No tremor Neurologic:  alert & oriented X3, sensation intact to light touch over feet, and DTRs symmetrical and normal.   Skin:  Minor urticarial lesion L upper thigh Cervical Nodes:  No lymphadenopathy noted Axillary Nodes:  No palpable lymphadenopathy Psych:  memory intact for recent and remote, normally interactive, and good eye contact.     Impression & Recommendations:  Problem # 1:  HYPERLIPIDEMIA (ICD-272.2) LDL not @ goal but hypothyroid His updated medication list for this problem includes:    Welchol 625 Mg Tabs (Colesevelam hcl) .Marland KitchenMarland KitchenMarland KitchenMarland Kitchen 6 tabs once daily patient  Problem # 2:   HYPOTHYROIDISM (ICD-244.9)  His updated medication list for this problem includes:    Synthroid 50 Mcg Tabs (Levothyroxine sodium) .Marland KitchenMarland KitchenMarland KitchenMarland Kitchen 3 tabs daily except  4 pills weds & sun  Problem # 3:  DM (ICD-250.00)  His updated medication list for this problem includes:    Aspirin 81 Mg Tabs (Aspirin) .Marland Kitchen... Take 1 tablet by mouth once a day    Humulin R 100 Unit/ml Inj Soln (Insulin regular human) ..... Sliding scale    Lantus 100 Unit/ml Soln (Insulin glargine) ..... Uses 15-20 units at bedtime but can go as high as 35units qhs  Problem # 4:  ATRIAL FIBRILLATION, PAROXYSMAL (ICD-427.31) RR today His updated medication list for this problem includes:    Lanoxin 0.25 Mg Tabs (Digoxin) .Marland Kitchen... 1 by mouth once daily except 2 on weds    Dilt-cd 240 Mg Xr24h-cap (Diltiazem hcl coated beads) .Marland Kitchen... Take one daily    Aspirin 81 Mg Tabs (Aspirin) .Marland Kitchen... Take 1 tablet by mouth once a day  Problem # 5:  CARPAL TUNNEL SYNDROME, LEFT (ICD-354.0) Hand Surgery if no better with therapeutic TSH  Complete Medication List: 1)  Furosemide 20 Mg Tabs (furosemide) Dye Free  .... Take 1 tab once daily**dye free** 2)  Synthroid 50 Mcg Tabs (Levothyroxine sodium) .... 3 tabs daily except  4 pills weds & sun 3)  Lanoxin 0.25 Mg Tabs (Digoxin) .Marland Kitchen.. 1 by mouth once daily except 2 on weds 4)  Dilt-cd 240 Mg Xr24h-cap (Diltiazem hcl coated beads) .... Take one daily 5)  Welchol 625 Mg Tabs (Colesevelam hcl) .... 6 tabs once daily patient 6)  Aspirin 81 Mg Tabs (Aspirin) .... Take 1 tablet by mouth once a day 7)  Ocuvite Tabs (Multiple vitamins-minerals) .Marland Kitchen.. 1 by mouth two times a day 8)  Humulin R 100 Unit/ml Inj Soln (Insulin regular human) .... Sliding scale 9)  Lantus 100 Unit/ml Soln (Insulin glargine) .... Uses 15-20 units at bedtime but can go as high as 35units qhs 10)  Must Have Meds Listed Do Not Substitute Generics Patient All  .... Patient allergic to dyes 11)  Vitamin D 1000 Unit Caps (Cholecalciferol) ....  Take 1 capsule by mouth once a day 12)  Zolpidem Tartrate 5 Mg Tabs (Zolpidem tartrate) .Marland Kitchen.. 1 at bedtime occasionally for sleep 13)  Bd Insulin Syringe U-100 1 Ml Misc (Insulin syringes (disposable)) .... Uses three times daily 14)  Proair Hfa 108 (90 Base) Mcg/act Aers (Albuterol sulfate) .... Inhale 2 puffs four times a day as needed for shortness of breath 15)  Omeprazole 20 Mg Cpdr (Omeprazole) .Marland Kitchen.. 1 by mouth once daily as needed 16)  Diabetic Shoes -250.00  .Marland Kitchen.. 1 pair-  250.00  Patient Instructions: 1)  Avoid  concentrated sweets in diet. 2)  Please schedule a follow-up appointment in 4 months. 3)  Check your blood sugars regularly. If your readings are usually above :150  or below 90 you should contact our office. 4)  See your eye doctor yearly to check for diabetic eye damage. 5)  Check your feet each night for sore areas, calluses or signs of infection.Call if you want to see a Podiatrist  6)  Lipid Panel prior to visit, ICD-9:272.4 7)  TSH prior to visit, ICD-9:244.9 8)  HbgA1C prior to visit, ICD-9:250.00 Prescriptions: SYNTHROID 50 MCG  TABS (LEVOTHYROXINE SODIUM) 3 tabs daily EXCEPT  4 pills Weds & Sun  #300 x 1   Entered and Authorized by:   Marga Melnick MD   Signed by:   Marga Melnick MD on 07/24/2009   Method used:   Faxed to ...       [facility]  [address] [phone]* (retail)       [address]       [place], [place]  [address]       Ph: [phone]       Fax: [phone]   RxID:   [id number] BD INSULIN SYRINGE U-100 1 ML  MISC (INSULIN SYRINGES (DISPOSABLE)) uses three times daily  #100 Each x 5   Entered and Authorized by:   Marga Melnick MD   Signed by:   Marga Melnick MD on 07/24/2009   Method used:   Faxed to ...       [facility]  [address] [phone]* (retail)       [address]       [place], [place]  [address]       Ph: [phone]       Fax: [phone]   RxID:   [id number]

## 2010-02-13 NOTE — Assessment & Plan Note (Signed)
Summary: still sick//jrc   Primary Provider/Referring Provider:  Rockie Neighbours  CC:  Acute visit-still coughing-yellow in color and congestion. Slight wheezing at times..  History of Present Illness:  November 25, 2009- Bronchitis, hx Urticaria........Marland Kitchenwife here Nurse-CC: Bronchitis, hx Urticaria Acute visit.Called Nov 9 with malaise, discolored sputum> Zpak.  Recent antibiotics cleared discolored sputum but he just doesn't feel well. Sustained cough at night. Hearing aid out- trouble hearing me, but he says not coughing out much. Frontal headache, no fever, no GI upset. He says Depo shot helps even though he knows it will run up his sugar for a bit.  They moved into [place].   December 17, 2009- Bronchitis, hx Urticaria Nurse-CC: Acute Visit, post hospital for chest pain,prod cough grayish, nasal congestion, sinus headache x 3 days Acute visit with nasal congestion, sore throat, cough such that he slept in chair last night. He thinks he has an infection.  He was hosp 12/2-2/11 for chest pain, ROMI, and told he had probably reflux. We talked today about ddx viral infection vs reflux. CXR showed NAD. Stress test was neg for ischemia. EF 74%.  December 25, 2009- Bronchitis, hx Urticaria....................Marland Kitchenwife here Nurse-CC: Acute visit-still coughing-yellow in color and congestion. Slight wheezing at times. He still complains of chest congestion, deep cough with scant yellow, tired. Denies fever, weight loss, swollen glands, fluid retention, GI or bladder upset.  We gave Zpak and depo last time.    Preventive Screening-Counseling & Management  Alcohol-Tobacco     Alcohol drinks/day: 0     Smoking Status: never  Current Medications (verified): 1)  Furosemide 20 Mg Tabs (Furosemide) Dye Free .... Take 1 Tab Once Daily**dye Free** 2)  Synthroid 50 Mcg  Tabs (Levothyroxine Sodium) .... 3 Tabs Daily Except  4 Pills Weds & Sun 3)  Lanoxin 0.25 Mg  Tabs (Digoxin) .Marland Kitchen.. 1 By Mouth Once  Daily 4)  Dilt-Cd 240 Mg Xr24h-Cap (Diltiazem Hcl Coated Beads) .... Take One Daily 5)  Welchol 625 Mg  Tabs (Colesevelam Hcl) .... 6 Tabs Once Daily Patient 6)  Aspirin 81 Mg  Tabs (Aspirin) .... Take 1 Tablet By Mouth Once A Day 7)  Ocuvite   Tabs (Multiple Vitamins-Minerals) .Marland Kitchen.. 1 By Mouth Two Times A Day 8)  Humulin R 100 Unit/ml Inj Soln (Insulin Regular Human) .... Sliding Scale 9)  Lantus 100 Unit/ml  Soln (Insulin Glargine) .... Uses 15-20 Units At Bedtime But Can Go As High As 35units Qhs 10)  Must Have Meds Listed Do Not Substitute Generics Patient All .... Patient Allergic To Dyes 11)  Vitamin D 1000 Unit  Caps (Cholecalciferol) .... Take 1 Capsule By Mouth Once A Day 12)  Zolpidem Tartrate 5 Mg  Tabs (Zolpidem Tartrate) .Marland Kitchen.. 1 At Bedtime Occasionally For Sleep 13)  Bd Insulin Syringe U-100 1 Ml  Misc (Insulin Syringes (Disposable)) .... Uses Three Times Daily 14)  Proair Hfa 108 (90 Base) Mcg/act  Aers (Albuterol Sulfate) .... Inhale 2 Puffs Four Times A Day As Needed For Shortness of Breath 15)  Omeprazole 20 Mg  Cpdr (Omeprazole) .Marland Kitchen.. 1 By Mouth Once Daily As Needed 16)  Diabetic Shoes -250.00 .Marland Kitchen.. 1 Pair-  250.00 17)  Bayer Contour Test  Strp (Glucose Blood) .... Check Bloodsugar 4 X Daily 18)  Lancets  Misc (Lancets) .... Check Bloodsugar 4 X Daily  Dx: 250.00 19)  Benadryl 25 Mg Tabs (Diphenhydramine Hcl) .... Take 1 By Mouth Once Daily As Needed 20)  Benzonatate 100 Mg Caps (Benzonatate) .Marland Kitchen.. 1-2  Three Times A Day As Needed Cough  Allergies (verified): 1)  ! Codeine 2)  ! * Tetanus 3)  ! Zocor 4)  ! Lipitor 5)  ! Zetia 6)  ! Crestor 7)  ! Percocet 8)  ! * Nitro 9)  ! * Dyes 10)  * Iv Dye  Past History:  Past Medical History: Last updated: 11/25/2009 Skin cancer, PMH  of Hypothyroidism,  Prostate cancer, PMH of 2007, Dr Vonita Moss asthmatic bronchitis allergic rhinitis Urticara,PMH of, Dr Reather Laurence dyslipidemia ; intolerance to multiple statins &  Zetia Diabetes mellitus, type II Macular Degeneration , Dr Luciana Axe  Past Surgical History: Last updated: 11/25/2009 PAF, PMH of 2005-POPLITEAL CYSTECTOMY 2006-GALLSTONES( no cholecystectomy) 2007-PROSTATE CANCER; S/P  RADIATION TREATMENT 2008-TKR on   R, 2010 on L PARTIAL THYROIDECTOMY 1985 FOR NODULES Left partial lobectomy for bronchiectasis age 71  Family History: Last updated: 08/23/2009 sister: Ophth disease father: MI times seven mother: htn, cva  @ age 2  Social History: Last updated: 08/23/2009 retired married with 2 children  Risk Factors: Alcohol Use: 0 (12/25/2009) Caffeine Use: 1-2 cups/ day (12/17/2009) Diet: no excess sweets (12/17/2009) Exercise: yes (12/17/2009)  Risk Factors: Smoking Status: never (12/25/2009)  Review of Systems      See HPI       The patient complains of shortness of breath with activity, productive cough, nasal congestion/difficulty breathing through nose, anxiety, and change in color of mucus.  The patient denies shortness of breath at rest, non-productive cough, coughing up blood, chest pain, irregular heartbeats, acid heartburn, indigestion, loss of appetite, weight change, abdominal pain, difficulty swallowing, sore throat, tooth/dental problems, headaches, sneezing, and hand/feet swelling.    Vital Signs:  Patient profile:   77 year old male Height:      71 inches Weight:      172.13 pounds BMI:     24.09 O2 Sat:      96 % on Room air Pulse rate:   72 / minute BP sitting:   106 / 70  (left arm) Cuff size:   regular  Vitals Entered By: Reynaldo Minium CMA (December 25, 2009 9:18 AM)  O2 Flow:  Room air CC: Acute visit-still coughing-yellow in color and congestion. Slight wheezing at times.   Physical Exam  Additional Exam:  General: A/Ox3; pleasant and cooperative, NAD, thin and frail looking SKIN: no rash, lesions NODES: no lymphadenopathy HEENT: [place]/AT, EOM- WNL, Conjuctivae- clear, PERRLA, TM-bilat hearing aids,  Nose- clear, Throat- clear and wnl, Mallampati II. Less red throat, coblestoned. Raspy voice. No stridor NECK: Supple w/ fair ROM, JVD- none, normal carotid impulses w/o bruits Thyroid- CHEST: No cough or wheeze, cough, no rhonchi HEART: RRR, no m/g/r heard ABDOMEN: slender, no HSE WJX:BJYN, nl pulses, no edema  NEURO: Grossly intact to observation      Impression & Recommendations:  Problem # 1:  BRONCHITIS (ICD-490)  I think he is complaining of a mild persistent bronchitis. He is just not strong. Question if there is more going on. We will get CXR and CBC.  His wife has antibiotic at home and they will check what it is and call me back for decision to use that or use doxycycline.  The following medications were removed from the medication list:    Zithromax Z-pak 250 Mg Tabs (Azithromycin) .Marland Kitchen... 2 today then one daily His updated medication list for this problem includes:    Proair Hfa 108 (90 Base) Mcg/act Aers (Albuterol sulfate) ..... Inhale 2 puffs four times a day as  needed for shortness of breath    Benzonatate 100 Mg Caps (Benzonatate) .Marland Kitchen... 1-2 three times a day as needed cough    Doxycycline Hyclate 100 Mg Caps (Doxycycline hyclate) .Marland Kitchen... 2 today then one daily  Medications Added to Medication List This Visit: 1)  Doxycycline Hyclate 100 Mg Caps (Doxycycline hyclate) .... 2 today then one daily  Other Orders: Est. Patient Level III (99213) T-2 View CXR (71020TC) TLB-CBC Platelet - w/Differential (85025-CBCD)  Patient Instructions: 1)  Keep appointment or call sooner if needed.  2)  Call us back with the name of the antibiotic you have at home. 3)  Script for doxycycline. 4)  A chest x-ray has been recommended.  Your imaging study may require preauthorization.  5)  Sample Dulera 100-5 6)  2 puffs and rinse mouth well, twice daily.  Prescriptions: DOXYCYCLINE HYCLATE 100 MG CAPS (DOXYCYCLINE HYCLATE) 2 today then one daily  #8 x 1   Entered and Authorized by:    Waymon Budge MD   Signed by:   Waymon Budge MD on 12/25/2009   Method used:   Print then Give to Patient   RxID:   [id number]

## 2010-02-13 NOTE — Progress Notes (Signed)
Summary: FORM FOR [facility]--HAS APPOINTMENT  Phone Note Call from Patient Call back at Home Phone [phone]   Caller: Spouse Summary of Call: PATIENT'S SPOUSE  BROUGHT IN FORM FOR MEDICAL HISTORY AND HEALTH EXAM FOR [facility]  WILL TAKE TO CHRAE IN PLASTIC SLEEVE  PLEASE RETURN TOP SHEET AND ALL SHEETS OF COMPLETED FORM TO [facility]  Initial call taken by: Jerolyn Shin,  August 12, 2009 3:52 PM  Follow-up for Phone Call        Patient needs a 30 min appointment, the packet that she dropped off is asking for EGK, TB skin test and other information that is NOT up to date in patient's chart  If any forms is dropped off and patient not with recent CPX appointment is Due Follow-up by: Shonna Chock CMA,  August 12, 2009 4:12 PM  Additional Follow-up for Phone Call Additional follow up Details #1::        PATIENT HAS 30 MIN APPOINTMENT FOR [facility] 08/23/2009 AT 1:00  Additional Follow-up by: Jerolyn Shin,  August 14, 2009 9:02 AM

## 2010-02-13 NOTE — Progress Notes (Signed)
Summary: med to expensive   Phone Note Refill Request Call back at Home Phone [phone]   Refills Requested: Medication #1:  EDECRIN 25 MG TABS 1 by mouth once daily pt  wife states that med is expensive and would like to know if dr Salisa Broz would consider changing him to furosemide. pt must have white pill and this is a white one as well. pt wife would like rx sent into [facility] for 90 day supply......Marland KitchenFelecia Deloach CMA  April 26, 2009 4:56 PM    Follow-up for Phone Call        per dr Barnard Sharps ok to change med to furosemide 20 mg must be dye free #90 1 tab once daily ...........Marland KitchenFelecia Deloach CMA  April 26, 2009 5:21 PM   Additional Follow-up for Phone Call Additional follow up Details #1::        PT WIFE AWARE RX SENT TO PHARMACY...........Marland KitchenFelecia Deloach CMA  April 26, 2009 5:24 PM     New/Updated Medications: * FUROSEMIDE 20 MG TABS (FUROSEMIDE) DYE FREE Take 1 tab once daily**DYE FREE** Prescriptions: FUROSEMIDE 20 MG TABS (FUROSEMIDE) DYE FREE Take 1 tab once daily**DYE FREE**  #90 x 0   Entered by:   Jeremy Johann CMA   Authorized by:   Marga Melnick MD   Signed by:   Jeremy Johann CMA on 04/26/2009   Method used:   Faxed to ...       [facility]* (mail-order)             ,          Ph: [phone]       Fax: [phone]   RxID:   [id number]

## 2010-02-13 NOTE — Progress Notes (Signed)
Summary: bronchitis/ req to see cy today  Phone Note Call from Patient Call back at Home Phone [phone]   Caller: Spouse- ann [place] Call For: young Summary of Call: pt was seen 11/14. was getting "slightly better" but not feels like he has bronchitis again. coughing till he chokes. wants to know if dr young can see him today. [facility] [address]  Initial call taken by: Tivis Ringer, CNA,  December 16, 2009 9:09 AM  Follow-up for Phone Call        Spoke with pt and he states that he saw CY on 11-25-09 and received a depo injection and felt better for a few weeks but never completely got rid of symptoms. Not the symptoms have begun to get worse. he is c/o nasal congestion, dry cough, sore throat and chest congestion.  Please advsie. Carron Curie CMA  December 16, 2009 10:01 AM allergies: 1)  ! Codeine 2)  ! * Tetanus 3)  ! Zocor 4)  ! Lipitor 5)  ! Zetia 6)  ! Crestor 7)  ! Percocet 8)  ! * Nitro 9)  ! * Dyes 10)  * Iv Dye  Additional Follow-up for Phone Call Additional follow up Details #1::        Will see what we can do Additional Follow-up by: Waymon Budge MD,  December 16, 2009 10:31 AM    Additional Follow-up for Phone Call Additional follow up Details #2::    I have spoke with pt-will be here tuesday at 10am for acute visit with CDY.Reynaldo Minium CMA  December 16, 2009 10:34 AM

## 2010-02-13 NOTE — Assessment & Plan Note (Signed)
Summary: PAPERWORK TO COMPLETE--PER CHRAE, NEEDS EKG, TB TEST, ETC///SPH   Vital Signs:  Patient profile:   77 year old male Height:      71 inches Weight:      175 pounds BMI:     24.50 Temp:     98.6 degrees F oral Pulse rate:   64 / minute Resp:     14 per minute BP sitting:   130 / 78  (left arm) Cuff size:   large  Vitals Entered By: Shonna Chock CMA (August 23, 2009 1:09 PM) CC: Complete paperwork, Type 2 diabetes mellitus follow-up Comments Patient aware to return on Monday for PPD Skin Test reading   Primary Care Provider:  Rockie Neighbours  CC:  Complete paperwork and Type 2 diabetes mellitus follow-up.  History of Present Illness: Here for Medicare AWV: 1.Risk factors based on Past M, S, F history:DM; Dyslipidemia; Hypothyroidism 2.Physical Activities: see data 3.Depression/mood: active issues denied 4.Hearing: he wears  bilateral hearing aids while awake 5.ADL's: no limitations except driving restricted to non  highway, no night driving &  speed < 45 mph due to Macular Degeneration 6.Fall Risk: denied 7.Home Safety:he will be moving to [facility] in 09/2009  8.Height, weight, &visual acuity:he sees  Dr Luciana Axe every 4-6 months; S/P cataract OS in 07/2009 by Dr Hazle Quant 9.Counseling: none requested 10.Labs ordered based on risk factors: see Orders 11. Referral Coordination:none requested(Podiatry referral offered) 12. Care Plan: see Instructions 13.Cognitive Assessment: Oriented X 3; recall excellent; subtraction from 100 slow but correct ; mood & affect normal Type 2 Diabetes Mellitus Follow-Up      This is a 77 year old man who presents for Type 2 diabetes mellitus follow-up.  The patient reports polyuria (he is being treated by Dr Vonita Moss with "electricity " @ ankle), self managed hypoglycemia occasionally @ night, and numbness of L hand, but denies polydipsia, weight loss, and weight gain.  Other symptoms include vision loss related to Macular  Degeneration.  The patient denies the following symptoms: neuropathic pain, chest pain, vomiting, orthostatic symptoms, poor wound healing, intermittent claudication, and foot ulcer.  Since the last visit the patient reports good dietary compliance and compliance with medications.  The patient has been measuring capillary blood glucose before breakfast( 80-95) and  2 hrs after dinner( 154-220).  Since the last visit, the patient reports having had eye care by an Ophthalmologist (cataract surgery) but  no foot care.  Labs from 07/2009 reviewed; F/U TSH needed in late 09/2009 (244.9)  Preventive Screening-Counseling & Management  Alcohol-Tobacco     Alcohol drinks/day: 0     Smoking Status: never  Caffeine-Diet-Exercise     Caffeine use/day: 1-2 cups/ day     Diet Comments: no excess sweets     Does Patient Exercise: yes     Type of exercise: walking     Exercise (avg: min/session): 30 min     Times/week: 2-3X  Hep-HIV-STD-Contraception     Dental Visit-last 6 months yes     Sun Exposure-Excessive: no  Safety-Violence-Falls     Seat Belt Use: yes     Firearms in the Home: firearms in the home     Firearm Counseling: not indicated; uses recommended firearm safety measures     Smoke Detectors: yes     Violence in the Home: no risk noted     Sexual Abuse: no      Sexual History:  currently monogamous.  Drug Use:  never.        Blood Transfusions:  no.        Travel History:  none  in > 12 months.    Allergies: 1)  ! Codeine 2)  ! * Tetanus 3)  ! Zocor 4)  ! Lipitor 5)  ! Zetia 6)  ! Crestor 7)  ! Percocet 8)  ! * Nitro 9)  ! * Dyes 10)  * Iv Dye  Past History:  Past Medical History: Skin cancer, PMH  of Hypothyroidism Prostate cancer, PMH of 2007, Dr Vonita Moss left partial lobectomy for bronchiectasis age 27; asthmatic bronchitis allergic rhinitis Urticara,PMH of, Dr Reather Laurence dyslipidemia ; intolerance to multiple statins & Zetia Diabetes mellitus, type  II Macular Degeneration , Dr Luciana Axe  Past Surgical History: PAF, PMH of 2005-POPLITEAL CYSTECTOMY 2006-GALLSTONES( no cholecystectomy) 2007-PROSTATE CANCER; S/P  RADIATION TREATMENT 2008-TKR on   R, 2010 on L PARTIAL THYROIDECTOMY 1985 FOR NODULES  Family History: sister: Ophth disease father: MI times seven mother: htn, cva  @ age 33  Social History: retired married with 2 childrenCaffeine use/day:  1-2 cups/ day Dental Care w/in 6 mos.:  yes Sun Exposure-Excessive:  no Risk analyst Use:  yes Sexual History:  currently monogamous Drug Use:  never Blood Transfusions:  no  Review of Systems  The patient denies anorexia, fever, prolonged cough, hemoptysis, abdominal pain, melena, hematochezia, severe indigestion/heartburn, suspicious skin lesions, unusual weight change, abnormal bleeding, enlarged lymph nodes, and angioedema.    Physical Exam  General:  Thin ; alert,appropriate and cooperative throughout examination Head:  Normocephalic and atraumatic without obvious abnormalities. No apparent alopecia Eyes:  No corneal or conjunctival inflammation noted. Perrla.Arcus senilis Ears:  Aids worn bilaterally Nose:  External nasal examination shows no deformity or inflammation. Nasal mucosa are pink and moist without lesions or exudates. Septal deviation Mouth:  Oral mucosa and oropharynx without lesions or exudates.  Teeth in good repair. Neck:  No deformities, masses, or tenderness noted. R lobe absent Lungs:  Normal respiratory effort, chest expands symmetrically. Lungs are clear to auscultation, no crackles or wheezes. Heart:  regular rhythm, no murmur, no gallop, no rub, no JVD, no HJR, and bradycardia.  S4 Abdomen:  Bowel sounds positive,abdomen soft and non-tender without masses, organomegaly or hernias noted. Lipomatous changes subcutaneously  in lower abdomen ( from insulin injections) Genitalia:  Dr Vonita Moss Msk:  No deformity or scoliosis noted of thoracic or lumbar spine.    Pulses:  R and L carotid,radial,dorsalis pedis and posterior tibial pulses are full and equal bilaterally Extremities:  No clubbing, cyanosis, edema. Chronic deformity R great toenail( Podiatry referral declined) Neurologic:  alert & oriented X3, sensation intact to light touch over feet, and DTRs symmetrical and normal.   Skin:  Intact without suspicious lesions or rashes. Cherry angiomata Cervical Nodes:  No lymphadenopathy noted Axillary Nodes:  No palpable lymphadenopathy Psych:  memory intact for recent and remote, normally interactive, good eye contact, not anxious appearing, and not depressed appearing.     Impression & Recommendations:  Problem # 1:  PREVENTIVE HEALTH CARE (ICD-V70.0)  Orders: [facility] -Subsequent Annual Wellness Visit [phone])  Problem # 2:  DM (ICD-250.00)  His updated medication list for this problem includes:    Aspirin 81 Mg Tabs (Aspirin) .Marland Kitchen... Take 1 tablet by mouth once a day    Humulin R 100 Unit/ml Inj Soln (Insulin regular human) ..... Sliding scale    Lantus 100 Unit/ml Soln (Insulin glargine) ..... Uses 15-20 units at  bedtime but can go as high as 35units qhs  Orders: EKG w/ Interpretation (93000)  Problem # 3:  HYPERLIPIDEMIA (ICD-272.2)  His updated medication list for this problem includes:    Welchol 625 Mg Tabs (Colesevelam hcl) .Marland KitchenMarland KitchenMarland KitchenMarland Kitchen 6 tabs once daily patient  Orders: EKG w/ Interpretation (93000)  Problem # 4:  HYPOTHYROIDISM (ICD-244.9)  His updated medication list for this problem includes:    Synthroid 50 Mcg Tabs (Levothyroxine sodium) .Marland KitchenMarland KitchenMarland KitchenMarland Kitchen 3 tabs daily except  4 pills weds & sun  Complete Medication List: 1)  Furosemide 20 Mg Tabs (furosemide) Dye Free  .... Take 1 tab once daily**dye free** 2)  Synthroid 50 Mcg Tabs (Levothyroxine sodium) .... 3 tabs daily except  4 pills weds & sun 3)  Lanoxin 0.25 Mg Tabs (Digoxin) .Marland Kitchen.. 1 by mouth once daily except 2 on weds 4)  Dilt-cd 240 Mg Xr24h-cap (Diltiazem hcl coated beads) .... Take  one daily 5)  Welchol 625 Mg Tabs (Colesevelam hcl) .... 6 tabs once daily patient 6)  Aspirin 81 Mg Tabs (Aspirin) .... Take 1 tablet by mouth once a day 7)  Ocuvite Tabs (Multiple vitamins-minerals) .Marland Kitchen.. 1 by mouth two times a day 8)  Humulin R 100 Unit/ml Inj Soln (Insulin regular human) .... Sliding scale 9)  Lantus 100 Unit/ml Soln (Insulin glargine) .... Uses 15-20 units at bedtime but can go as high as 35units qhs 10)  Must Have Meds Listed Do Not Substitute Generics Patient All  .... Patient allergic to dyes 11)  Vitamin D 1000 Unit Caps (Cholecalciferol) .... Take 1 capsule by mouth once a day 12)  Zolpidem Tartrate 5 Mg Tabs (Zolpidem tartrate) .Marland Kitchen.. 1 at bedtime occasionally for sleep 13)  Bd Insulin Syringe U-100 1 Ml Misc (Insulin syringes (disposable)) .... Uses three times daily 14)  Proair Hfa 108 (90 Base) Mcg/act Aers (Albuterol sulfate) .... Inhale 2 puffs four times a day as needed for shortness of breath 15)  Omeprazole 20 Mg Cpdr (Omeprazole) .Marland Kitchen.. 1 by mouth once daily as needed 16)  Diabetic Shoes -250.00  .Marland Kitchen.. 1 pair-  250.00  Other Orders: TB Skin Test [phone]) Admin 1st Vaccine ([address])  Patient Instructions: 1)  It is important that you exercise regularly at least 20 minutes 5 times a week. If you develop chest pain, have severe difficulty breathing, or feel very tired , stop exercising immediately and seek medical attention. 2)  Take an  81 mg coated Aspirin every day. 3)  See your eye doctor yearly to check for diabetic eye damage ( your Macular Degeneration monitoring would cover this. 4)  Check your feet each night for sore areas, calluses or signs of infection. 5)  Check your blood sugars regularly. If your readings are usually above : 150 or below 90  fasting or > 180 consistently 2 hrs after a meal you should contact our office. Repeat TSH as scheduled (244.9)   Immunizations Administered:  PPD Skin Test:    Vaccine Type: PPD    Site: right forearm     Mfr: Sanofi Pasteur    Dose: 0.1 ml    Route: ID    Given by: Shonna Chock CMA    Exp. Date: 11/10/2010    Lot #: [id number]   Appended Document: PAPERWORK TO COMPLETE--PER CHRAE, NEEDS EKG, TB TEST, ETC///SPH   PPD Results    Date of reading: 08/26/2009    Results: < 5mm    Interpretation: negative

## 2010-02-13 NOTE — Progress Notes (Signed)
Summary: med info- pt was seen today - LMOM TCB x1  Phone Note Call from Patient Call back at Home Phone [phone]   Caller: Spouse- mrs Nan Call For: young Summary of Call: pt was seen today. spouse calling to give med info to [place] as instructed. she has tetracycline 500mg  (30 tabs on hand) at home. pls advise if pt could take this.   Spouse calling again in ref to earlier message.Darletta Moll  December 25, 2009 2:39 PM  Initial call taken by: Tivis Ringer, CNA,  December 25, 2009 11:53 AM  Follow-up for Phone Call        Per CDY-take RX on hand 1 by mouth two times a day x 7 days.Reynaldo Minium CMA  December 25, 2009 3:24 PM   [facility] TCB x1. Boone Master CNA/MA  December 25, 2009 3:35 PM  Follow-up by: Waymon Budge MD,  December 25, 2009 8:06 PM    New/Updated Medications: TETRACYCLINE HCL 500 MG CAPS (TETRACYCLINE HCL) 1 two times a day x 7 days  Appended Document: med info- pt was seen today - LMOM TCB x1 Spoke with pts spouse this morning; she is aware how to have patient take Tetracycline per CDY.

## 2010-02-13 NOTE — Assessment & Plan Note (Signed)
Summary: 4 month roa//lh   Vital Signs:  Patient profile:   77 year old male Height:      71 inches Weight:      174 pounds Temp:     97.6 degrees F oral Pulse rate:   80 / minute Resp:     16 per minute BP sitting:   130 / 82  (left arm)  Vitals Entered By: Jeremy Johann CMA (January 21, 2009 11:32 AM) CC: f/u labs Comments REVIEWED MED LIST, PATIENT AGREED DOSE AND INSTRUCTION CORRECT    Primary Care Provider:  Rockie Neighbours  CC:  f/u labs.  History of Present Illness: Labs reviewed,goals  & risks discussed. TSH improved but he is "sluggish". A1c stable@ 6.5%. Lipids improved but LDL 158, goal = < 100, ideally < 70.?" tension"  @ occiput , ? from worrying about moving to [facility] & possible loss of driver's license due to Ophth condition. FBS 85-115; 2 hrs post meal < 220 during holidays. Hypoglycemia @ night 2-3X. Weight stable.  Allergies: 1)  ! Codeine 2)  ! * Tetanus 3)  ! Zocor 4)  ! Lipitor 5)  ! Zetia 6)  ! Crestor 7)  ! Percocet 8)  ! * Nitro 9)  ! * Dyes 10)  * Iv Dye  Past History:  Past Medical History: Skin cancer, hx of Hypothyroidism 2007-prostate cancer left partial lobectomy for bronchiectasis age 13; asthmatic bronchitis allergic rhinitis uticara,PMH of, Dr Reather Laurence dysrrhythmia; intolerance to multiple statins & Zetia Diabetes mellitus, type II Macular Degeneration , Dr Luciana Axe  Review of Systems General:  Complains of fatigue; denies chills, fever, sweats, and weight loss. Eyes:  Complains of blurring and vision loss-both eyes; denies double vision; Receiving injections for Macular Degeneration. CV:  Denies chest pain or discomfort, leg cramps with exertion, lightheadness, near fainting, and palpitations. Resp:  Denies cough and sputum productive; Recent bronchitis. Derm:  Complains of poor wound healing. Neuro:  Denies numbness and tingling. Endo:  Denies excessive hunger, excessive thirst, and excessive  urination. Allergy:  Denies hives or rash.  Physical Exam  General:  Thin , appears younger than age ,in no acute distress; alert,appropriate and cooperative throughout examination Neck:  No deformities, masses, or tenderness noted. Lungs:  Normal respiratory effort, chest expands symmetrically. Lungs are clear to auscultation, no crackles or wheezes. Heart:  Normal rate and regular rhythm. S1 and S2 normal without gallop, murmur, click, rub. S4 with sluring Abdomen:  Bowel sounds positive,abdomen soft and non-tender without masses, organomegaly. Ventral  hernia noted. Pulses:  R and L carotid,radial,dorsalis pedis and posterior tibial pulses are full and equal bilaterally Extremities:  No clubbing, cyanosis, edema. Neurologic:  alert & oriented X3 and DTRs symmetrical and normal.   Skin:  Intact without suspicious lesions or rashes Psych:  memory intact for recent and remote, normally interactive, and good eye contact.     Impression & Recommendations:  Problem # 1:  HYPOTHYROIDISM (ICD-244.9)  His updated medication list for this problem includes:    Synthroid 50 Mcg Tabs (Levothyroxine sodium) .Marland KitchenMarland KitchenMarland KitchenMarland Kitchen 3 tabs daily except 4  on weds  Problem # 2:  HYPERLIPIDEMIA (ICD-272.2) Intolr=erant to multiple statins & Zetia His updated medication list for this problem includes:    Welchol 625 Mg Tabs (Colesevelam hcl) .Marland KitchenMarland KitchenMarland KitchenMarland Kitchen 6 tabs once daily patient  Problem # 3:  DM (ICD-250.00) Adequate control His updated medication list for this problem includes:    Aspirin 81 Mg Tabs (Aspirin) .Marland Kitchen... Take  1 tablet by mouth once a day    Humulin R 100 Unit/ml Inj Soln (Insulin regular human) ..... Sliding scale    Lantus 100 Unit/ml Soln (Insulin glargine) ..... Uses 15-20 units at bedtime but can go as high as 35units qhs  Complete Medication List: 1)  Edecrin 25 Mg Tabs (Ethacrynic acid) .Marland Kitchen.. 1 by mouth once daily 2)  Synthroid 50 Mcg Tabs (Levothyroxine sodium) .... 3 tabs daily except 4  on  weds 3)  Lanoxin 0.25 Mg Tabs (Digoxin) .Marland Kitchen.. 1 by mouth once daily except 2 on weds 4)  Dilt-cd 240 Mg Xr24h-cap (Diltiazem hcl coated beads) .... Take one daily 5)  Welchol 625 Mg Tabs (Colesevelam hcl) .... 6 tabs once daily patient 6)  Aspirin 81 Mg Tabs (Aspirin) .... Take 1 tablet by mouth once a day 7)  Ocuvite Tabs (Multiple vitamins-minerals) .Marland Kitchen.. 1 by mouth two times a day 8)  Humulin R 100 Unit/ml Inj Soln (Insulin regular human) .... Sliding scale 9)  Lantus 100 Unit/ml Soln (Insulin glargine) .... Uses 15-20 units at bedtime but can go as high as 35units qhs 10)  Must Have Meds Listed Do Not Substitute Generics Patient All  .... Patient allergic to dyes 11)  Vitamin D 1000 Unit Caps (Cholecalciferol) .... Take 1 capsule by mouth once a day 12)  Zolpidem Tartrate 5 Mg Tabs (Zolpidem tartrate) .Marland Kitchen.. 1 at bedtime occasionally for sleep 13)  Bd Insulin Syringe U-100 1 Ml Misc (Insulin syringes (disposable)) .... Uses three times daily 14)  Proair Hfa 108 (90 Base) Mcg/act Aers (Albuterol sulfate) .... Inhale 2 puffs four times a day as needed for shortness of breath 15)  Omeprazole 20 Mg Cpdr (Omeprazole) .Marland Kitchen.. 1 by mouth once daily as needed 16)  Hydrocodone-acetaminophen 5-500 Mg Tabs (Hydrocodone-acetaminophen) .... Three times a day as needed  Patient Instructions: 1)  Please schedule a follow-up appointment in 6 months. 2)  BUN,creat, K+;Digoxin level; prior to visit, ICD-9: 3)  Hepatic Panel prior to visit, ICD-9: 4)  Lipid Panel prior to visit, ICD-9: 5)  TSH prior to visit, ICD-9: 6)  HbgA1C prior to visit, ICD-9: 7)  Urine Microalbumin prior to visit, ICD-9: Prescriptions: DILT-CD 240 MG XR24H-CAP (DILTIAZEM HCL COATED BEADS) take one daily  #90 x 3   Entered and Authorized by:   Marga Melnick MD   Signed by:   Marga Melnick MD on 01/21/2009   Method used:   Electronically to        [facility]* (mail-order)             ,          Ph: [phone]       Fax:  [phone]   RxID:   [id number] EDECRIN 25 MG TABS (ETHACRYNIC ACID) 1 by mouth once daily  #90 x 3   Entered and Authorized by:   Marga Melnick MD   Signed by:   Marga Melnick MD on 01/21/2009   Method used:   Faxed to ...       [facility]  [address] [phone]* (retail)       [address]       [place], [place]  [address]       Ph: [phone]       Fax: [phone]   RxID:   [id number] SYNTHROID 50 MCG  TABS (LEVOTHYROXINE SODIUM) 3 tabs daily EXCEPT 4  on Weds  #90 x 3   Entered and Authorized by:   Marga Melnick MD  Signed by:   Marga Melnick MD on 01/21/2009   Method used:   Faxed to ...       [facility]  [address] [phone]* (retail)       [address]       [place], [place]  [address]       Ph: [phone]       Fax: [phone]   RxID:   [id number]

## 2010-02-13 NOTE — Progress Notes (Signed)
  Phone Note Call from Patient   Caller: Spouse Summary of Call: Pts wife called and stated that [facility] needs an rx for diabetic shoes to be given to pt.   Follow-up for Phone Call        Pt is aware that rx has been faxed. Army Fossa CMA  June 14, 2009 11:40 AM     New/Updated Medications: * DIABETIC SHOES -250.00 1 pair-  250.00 Prescriptions: DIABETIC SHOES -250.00 1 pair-  250.00  #1 x p   Entered by:   Army Fossa CMA   Authorized by:   Marga Melnick MD   Signed by:   Army Fossa CMA on 06/14/2009   Method used:   Faxed to ...       [facility]* (retail)       [address]       [place], [place]  [id number]       Ph: [phone]       Fax: [phone]   RxID:   [id number]

## 2010-02-13 NOTE — Progress Notes (Signed)
Summary: pt still sick - requesting to see CY today  Phone Note Call from Patient   Caller: [phone]--ann--wife Call For: young Reason for Call: Talk to Nurse Summary of Call: Patient's wife calling for patient.  Patient finished zpack yesterday and is not any better. Patient still has a deep cough and is coughing up colored sputum Requesting appt w/ Young. Initial call taken by: Lehman Prom,  November 25, 2009 8:18 AM  Follow-up for Phone Call        ATC number provided above.  Received message stating "you have reached a number that has a VM box that has not been set up.  Goodbye."  WCB. Gweneth Dimitri RN  November 25, 2009 8:53 AM   Spoke with pt.  He states he finished zpak yesterday but still not any better.  Nonprod cough, head congestion, gray drainage.  Denies SOB, fever.  Requesting to see CY today.  Pls advise. Thanks! Gweneth Dimitri RN  November 25, 2009 9:24 AM   Additional Follow-up for Phone Call Additional follow up Details #1::        Pt to be here today 11-25-09 at 1100 for 1115am appt.Reynaldo Minium CMA  November 25, 2009 9:49 AM

## 2010-02-13 NOTE — Progress Notes (Signed)
Summary: still sick/cb  Phone Note Call from Patient Call back at Home Phone [phone]   Caller: Patient Call For: young Summary of Call: pt is still sick after finishing antibodic needs more [facility] [address] Initial call taken by: Lacinda Axon,  December 23, 2009 12:32 PM  Follow-up for Phone Call        Spoke with pt. He was just seen by CDY on 12/17/09- given depo 80 and zithromax- c/o feeling no better.  He states that he is still having sinus HA, congestion, wheezing at night, and prod cough with light yellow sputum.  Pls advise thanks! Allergies (verified):  1)  ! Codeine 2)  ! * Tetanus 3)  ! Zocor 4)  ! Lipitor 5)  ! Zetia 6)  ! Crestor 7)  ! Percocet 8)  ! * Nitro 9)  ! * Dyes 10)  * Iv Dye Follow-up by: Vernie Murders,  December 23, 2009 3:25 PM  Additional Follow-up for Phone Call Additional follow up Details #1::        Per CDY- no advice to offer now; if needed we can see him again in a week. 9:15am on Wednesday 12-25-09 if needed.Reynaldo Minium CMA  December 23, 2009 5:23 PM     Additional Follow-up for Phone Call Additional follow up Details #2::    pt set for appt on Wed at 9:15.Carron Curie CMA  December 23, 2009 5:26 PM

## 2010-02-13 NOTE — Assessment & Plan Note (Signed)
Summary: acute visit-nasal congestion, sore throat,cough/kcw   Primary Provider/Referring Provider:  Rockie Neighbours  CC:  Acute Visit, post hospital for chest pain, prod cough grayish, nasal congestion, and sinus headache x 3 days.  History of Present Illness: August 19, 2009- Bronchitis, hx Urticaria Moving soon to [facility]. complains now of cough and sinus drainage x 3 weeks. Clear mucus mostly from his sinuses, not much from chest, but it causes throat tickle cough. Mild frontal and retroorbital headaches with no fever or purulent. has been stirring up dust, and outdoors a lot running a yard sale. He thinks depo and neb help  best. Benadryl helps without drowsiness.  November 25, 2009- Bronchitis, hx Urticaria........Marland Kitchenwife here Nurse-CC: Bronchitis, hx Urticaria Acute visit.Called Nov 9 with malaise, discolored sputum> Zpak.  Recent antibiotics cleared discolored sputum but he just doesn't feel well. Sustained cough at night. Hearing aid out- trouble hearing me, but he says not coughing out much. Frontal headache, no fever, no GI upset. He says Depo shot helps even though he knows it will run up his sugar for a bit.  They moved into [place].   December 17, 2009- Bronchitis, hx Urticaria Nurse-CC: Acute Visit, post hospital for chest pain,prod cough grayish, nasal congestion, sinus headache x 3 days Acute visit with nasal congestion, sore throat, cough such that he slept in chair last night. He thinks he has an infection.  He was hosp 12/2-2/11 for chest pain, ROMI, and told he had probably reflux. We talked today about ddx viral infection vs reflux. CXR showed NAD. Stress test was neg for ischemia. EF 74%.   Preventive Screening-Counseling & Management  Alcohol-Tobacco     Alcohol drinks/day: 0     Smoking Status: never  Caffeine-Diet-Exercise     Caffeine use/day: 1-2 cups/ day     Diet Comments: no excess sweets     Does Patient Exercise: yes     Type of exercise: walking  Exercise (avg: min/session): 30 min     Times/week: 2-3X  Current Medications (verified): 1)  Furosemide 20 Mg Tabs (Furosemide) Dye Free .... Take 1 Tab Once Daily**dye Free** 2)  Synthroid 50 Mcg  Tabs (Levothyroxine Sodium) .... 3 Tabs Daily Except  4 Pills Weds & Sun 3)  Lanoxin 0.25 Mg  Tabs (Digoxin) .Marland Kitchen.. 1 By Mouth Once Daily 4)  Dilt-Cd 240 Mg Xr24h-Cap (Diltiazem Hcl Coated Beads) .... Take One Daily 5)  Welchol 625 Mg  Tabs (Colesevelam Hcl) .... 6 Tabs Once Daily Patient 6)  Aspirin 81 Mg  Tabs (Aspirin) .... Take 1 Tablet By Mouth Once A Day 7)  Ocuvite   Tabs (Multiple Vitamins-Minerals) .Marland Kitchen.. 1 By Mouth Two Times A Day 8)  Humulin R 100 Unit/ml Inj Soln (Insulin Regular Human) .... Sliding Scale 9)  Lantus 100 Unit/ml  Soln (Insulin Glargine) .... Uses 15-20 Units At Bedtime But Can Go As High As 35units Qhs 10)  Must Have Meds Listed Do Not Substitute Generics Patient All .... Patient Allergic To Dyes 11)  Vitamin D 1000 Unit  Caps (Cholecalciferol) .... Take 1 Capsule By Mouth Once A Day 12)  Zolpidem Tartrate 5 Mg  Tabs (Zolpidem Tartrate) .Marland Kitchen.. 1 At Bedtime Occasionally For Sleep 13)  Bd Insulin Syringe U-100 1 Ml  Misc (Insulin Syringes (Disposable)) .... Uses Three Times Daily 14)  Proair Hfa 108 (90 Base) Mcg/act  Aers (Albuterol Sulfate) .... Inhale 2 Puffs Four Times A Day As Needed For Shortness of Breath 15)  Omeprazole 20 Mg  Cpdr (Omeprazole) .Marland Kitchen.. 1 By Mouth Once Daily As Needed 16)  Diabetic Shoes -250.00 .Marland Kitchen.. 1 Pair-  250.00 17)  Bayer Contour Test  Strp (Glucose Blood) .... Check Bloodsugar 4 X Daily 18)  Lancets  Misc (Lancets) .... Check Bloodsugar 4 X Daily  Dx: 250.00 19)  Benadryl 25 Mg Tabs (Diphenhydramine Hcl) .... Take 1 By Mouth Once Daily As Needed 20)  Benzonatate 100 Mg Caps (Benzonatate) .Marland Kitchen.. 1-2 Three Times A Day As Needed Cough  Allergies (verified): 1)  ! Codeine 2)  ! * Tetanus 3)  ! Zocor 4)  ! Lipitor 5)  ! Zetia 6)  ! Crestor 7)  !  Percocet 8)  ! * Nitro 9)  ! * Dyes 10)  * Iv Dye  Past History:  Past Medical History: Last updated: 11/25/2009 Skin cancer, PMH  of Hypothyroidism,  Prostate cancer, PMH of 2007, Dr Vonita Moss asthmatic bronchitis allergic rhinitis Urticara,PMH of, Dr Reather Laurence dyslipidemia ; intolerance to multiple statins & Zetia Diabetes mellitus, type II Macular Degeneration , Dr Luciana Axe  Past Surgical History: Last updated: 11/25/2009 PAF, PMH of 2005-POPLITEAL CYSTECTOMY 2006-GALLSTONES( no cholecystectomy) 2007-PROSTATE CANCER; S/P  RADIATION TREATMENT 2008-TKR on   R, 2010 on L PARTIAL THYROIDECTOMY 1985 FOR NODULES Left partial lobectomy for bronchiectasis age 78  Family History: Last updated: 08/23/2009 sister: Ophth disease father: MI times seven mother: htn, cva  @ age 41  Social History: Last updated: 08/23/2009 retired married with 2 children  Risk Factors: Alcohol Use: 0 (12/17/2009) Caffeine Use: 1-2 cups/ day (12/17/2009) Diet: no excess sweets (12/17/2009) Exercise: yes (12/17/2009)  Risk Factors: Smoking Status: never (12/17/2009)  Review of Systems      See HPI       The patient complains of shortness of breath with activity, non-productive cough, and nasal congestion/difficulty breathing through nose.  The patient denies shortness of breath at rest, productive cough, coughing up blood, chest pain, irregular heartbeats, acid heartburn, indigestion, loss of appetite, weight change, abdominal pain, difficulty swallowing, sore throat, tooth/dental problems, headaches, and sneezing.    Vital Signs:  Patient profile:   77 year old male Height:      71 inches Weight:      174.2 pounds BMI:     24.38 O2 Sat:      97 % on Room air Temp:     99.7 degrees F oral Pulse rate:   77 / minute BP sitting:   140 / 70  (left arm)  Vitals Entered By: Renold Genta RCP, LPN (December 17, 2009 10:02 AM)  O2 Flow:  Room air CC: Acute Visit, post hospital for chest  pain,prod cough grayish, nasal congestion, sinus headache x 3 days Comments Medications reviewed with patient Renold Genta RCP, LPN  December 17, 2009 10:09 AM    Physical Exam  Additional Exam:  General: A/Ox3; pleasant and cooperative, NAD, thin and frail looking SKIN: no rash, lesions NODES: no lymphadenopathy HEENT: [place]/AT, EOM- WNL, Conjuctivae- clear, PERRLA, TM-bilat hearing aids, Nose- clear, Throat- clear and wnl, Mallampati II. Red throat, coblestoned. Raspy voice.No stridor NECK: Supple w/ fair ROM, JVD- none, normal carotid impulses w/o bruits Thyroid- CHEST: barking cough, no rhonchi HEART: RRR, no m/g/r heard ABDOMEN: slender ZOX:WRUE, nl pulses, no edema  NEURO: Grossly intact to observation      Impression & Recommendations:  Problem # 1:  BRONCHITIS (ICD-490)  URI/ bronchitis, vs reflux with some LPR. We discussed both. His wife so far has not caught this. We  discussed reflux precautions, which he can f/u w/ Dr Alwyn Ren. We discussed conservative management of viral infections. He wants depo shot for the cough.  His updated medication list for this problem includes:    Proair Hfa 108 (90 Base) Mcg/act Aers (Albuterol sulfate) ..... Inhale 2 puffs four times a day as needed for shortness of breath    Benzonatate 100 Mg Caps (Benzonatate) .Marland Kitchen... 1-2 three times a day as needed cough    Zithromax Z-pak 250 Mg Tabs (Azithromycin) .Marland Kitchen... 2 today then one daily  Problem # 2:  URTICARIA (ICD-708.9) There has been no recurrence since cyclosporine therapy.  Medications Added to Medication List This Visit: 1)  Zithromax Z-pak 250 Mg Tabs (Azithromycin) .... 2 today then one daily  Other Orders: Est. Patient Level IV (60454) Depo- Medrol 80mg  (J1040) Admin of Therapeutic Inj  intramuscular or subcutaneous (09811)  Patient Instructions: 1)  Keep scheduled appointment- earlier as needed.  2)  Script for Z pak  3)  Depo 80 4)  Fluids and rest. Watch for heartburn or any  sense that stomach fluid slides back up into your throat.  Prescriptions: ZITHROMAX Z-PAK 250 MG TABS (AZITHROMYCIN) 2 today then one daily  #1 pak x 0   Entered and Authorized by:   Waymon Budge MD   Signed by:   Waymon Budge MD on 12/17/2009   Method used:   Print then Give to Patient   RxID:   [id number]    Medication Administration  Injection # 1:    Medication: Depo- Medrol 80mg     Diagnosis: BRONCHITIS (ICD-490)    Route: IM    Site: LUOQ gluteus    Exp Date: 05/12/2012    Lot #: [id number]    Mfr: Pharmacia    Patient tolerated injection without complications    Given by: Kandice Hams CMA (December 17, 2009 10:53 AM)  Orders Added: 1)  Est. Patient Level IV [78469] 2)  Depo- Medrol 80mg  [J1040] 3)  Admin of Therapeutic Inj  intramuscular or subcutaneous [62952]

## 2010-02-13 NOTE — Progress Notes (Signed)
Summary: Refill Request  Phone Note Refill Request Message from:  Pharmacy [facility] Fax #: [phone]  Refills Requested: Medication #1:  WELCHOL 625 MG  TABS 6 tabs once daily patient   Dosage confirmed as above?Dosage Confirmed   Supply Requested: 3 months Next Appointment Scheduled: 7.12.11 Initial call taken by: Harold Barban,  March 26, 2009 12:40 PM    Prescriptions: WELCHOL 625 MG  TABS (COLESEVELAM HCL) 6 tabs once daily patient  #540 x 1   Entered by:   Kandice Hams   Authorized by:   Marga Melnick MD   Signed by:   Kandice Hams on 03/26/2009   Method used:   Faxed to ...       [facility]* (mail-order)             ,          Ph: [phone]       Fax: [phone]   RxID:   [id number]

## 2010-02-13 NOTE — Assessment & Plan Note (Signed)
Summary: COUGH///KP   Primary Provider/Referring Provider:  Rockie Neighbours  CC:  Cough-prodictive-white and sinus drainage with headache x 3-4 weeks.[name]  History of Present Illness: December 04, 2008- Bronchitis, hx urticaria.............................William Kitchenwife here Increased cough x 2 days. Came yesterday for depo, but not better going into the holiday. Sore throat, maybe a little fever. Nonproductive. had been raking leaves all day just before this with no sick exposure. Tessalon not helping.  December 25, 2008- Bronchitis, hx urticaria...........................William Kitchenwife here Got well after last here, except that drainage never quit. Cough syrup not enough help. In last 2-3 days his cough returned, productive light yellow. Denies sore throat or fever. Head congested, no sinus pain. Ears ok. Denies GI upset. Had taken Zpak and Doxy. He says cortisone shot usually helps.  March 06, 2009- .........................William Kitchenwife here Carpet cleaned 2 days ago. He bagan coughing and wheezing, relieved only by his Proair. Coughs until he retches. Taking benzonatate and cough syrup. Deep breath triggers cough.  August 19, 2009- Bronchitis, hx Urticaria Moving soon to [facility]. complains now of cough and sinus drainage x 3 weeks. Clear mucus mostly from his sinuses, not much from chest, but it causes throat tickle cough. Mild frontal and retroorbital headaches with no fever or purulent. has been stirring up dust, and outdoors a lot running a yard sale. He thinks depo and neb help  best. Benadryl helps without drowsiness.    Preventive Screening-Counseling & Management  Alcohol-Tobacco     Smoking Status: never  Current Medications (verified): 1)  Furosemide 20 Mg Tabs (Furosemide) Dye Free .... Take 1 Tab Once Daily**dye Free** 2)  Synthroid 50 Mcg  Tabs (Levothyroxine Sodium) .... 3 Tabs Daily Except  4 Pills Weds & Sun 3)  Lanoxin 0.25 Mg  Tabs (Digoxin) .[name].. 1 By Mouth Once Daily Except 2 On Weds 4)   Dilt-Cd 240 Mg Xr24h-Cap (Diltiazem Hcl Coated Beads) .... Take One Daily 5)  Welchol 625 Mg  Tabs (Colesevelam Hcl) .... 6 Tabs Once Daily Patient 6)  Aspirin 81 Mg  Tabs (Aspirin) .... Take 1 Tablet By Mouth Once A Day 7)  Ocuvite   Tabs (Multiple Vitamins-Minerals) .[name].. 1 By Mouth Two Times A Day 8)  Humulin R 100 Unit/ml Inj Soln (Insulin Regular Human) .... Sliding Scale 9)  Lantus 100 Unit/ml  Soln (Insulin Glargine) .... Uses 15-20 Units At Bedtime But Can Go As High As 35units Qhs 10)  Must Have Meds Listed Do Not Substitute Generics Patient All .... Patient Allergic To Dyes 11)  Vitamin D 1000 Unit  Caps (Cholecalciferol) .... Take 1 Capsule By Mouth Once A Day 12)  Zolpidem Tartrate 5 Mg  Tabs (Zolpidem Tartrate) .[name].. 1 At Bedtime Occasionally For Sleep 13)  Bd Insulin Syringe U-100 1 Ml  Misc (Insulin Syringes (Disposable)) .... Uses Three Times Daily 14)  Proair Hfa 108 (90 Base) Mcg/act  Aers (Albuterol Sulfate) .... Inhale 2 Puffs Four Times A Day As Needed For Shortness of Breath 15)  Omeprazole 20 Mg  Cpdr (Omeprazole) .[name].. 1 By Mouth Once Daily As Needed 16)  Diabetic Shoes -250.00 .[name].. 1 Pair-  250.00  Allergies (verified): 1)  ! Codeine 2)  ! * Tetanus 3)  ! Zocor 4)  ! Lipitor 5)  ! Zetia 6)  ! Crestor 7)  ! Percocet 8)  ! * Nitro 9)  ! * Dyes 10)  * Iv Dye  Past History:  Past Medical History: Last updated: 01/21/2009 Skin cancer, hx of Hypothyroidism 2007-prostate cancer  left partial lobectomy for bronchiectasis age 26; asthmatic bronchitis allergic rhinitis uticara,PMH of, Dr Reather Laurence dysrrhythmia; intolerance to multiple statins & Zetia Diabetes mellitus, type II Macular Degeneration , Dr Luciana Axe  Past Surgical History: Last updated: 04/12/2008 PAF 10/05-POPLITEAL CYSTECTOMY 6/06-GALLSTONES 8/07-PROSTATE CANCER; S/P  RADIATION TREATMENT 1/08-TKR R, 2/10-L PARTIAL THYROIDECTOMY 1985 FOR NODULES  Family History: Last updated:  04/11/2007 Coronary Heart Disease father MI times seven mother htn, cva age 44  Social History: Last updated: 06/17/2007 Patient never smoked.  retired married with 2 children  Risk Factors: Exercise: yes (01/12/2008)  Risk Factors: Smoking Status: never (08/19/2009)  Review of Systems      See HPI       The patient complains of non-productive cough, headaches, and nasal congestion/difficulty breathing through nose.  The patient denies shortness of breath with activity, shortness of breath at rest, productive cough, coughing up blood, chest pain, irregular heartbeats, acid heartburn, indigestion, loss of appetite, weight change, abdominal pain, difficulty swallowing, sore throat, and tooth/dental problems.    Vital Signs:  Patient profile:   77 year old male Height:      71 inches Weight:      177 pounds BMI:     24.78 O2 Sat:      99 % on Room air Pulse rate:   67 / minute BP sitting:   130 / 76  (left arm) Cuff size:   regular  Vitals Entered By: Reynaldo Minium CMA (August 19, 2009 1:52 PM)  O2 Flow:  Room air CC: Cough-prodictive-white and sinus drainage with headache x 3-4 weeks.   Physical Exam  Additional Exam:  General: A/Ox3; pleasant and cooperative, NAD, thin SKIN: no rash, lesions NODES: no lymphadenopathy HEENT: [place]/AT, EOM- WNL, Conjuctivae- clear, PERRLA, TM-bilat hearing aids, Nose- clear, Throat- clear and wnl, Mallampati II. No visible erythema or drainage. Raspy voice. NECK: Supple w/ fair ROM, JVD- none, normal carotid impulses w/o bruits Thyroid- CHEST: Diminished, shallow without dullness, no wheeze HEART: RRR, no m/g/r heard ABDOMEN: slender AVW:UJWJ, nl pulses, no edema  NEURO: Grossly intact to observation      Impression & Recommendations:  Problem # 1:  BRONCHITIS (ICD-490) Nonspecific exacerbation may be secondary to time spent outdoors with air quality/ ozone sxperience. His updated medication list for this problem includes:     Proair Hfa 108 (90 Base) Mcg/act Aers (Albuterol sulfate) ..... Inhale 2 puffs four times a day as needed for shortness of breath  Problem # 2:  URTICARIA (ICD-708.9) He is pleased to be clear of skin rash and itching.  Other Orders: Nebulizer Tx (19147) Depo- Medrol 80mg  (J1040) Admin of Therapeutic Inj  intramuscular or subcutaneous (82956)  Patient Instructions: 1)  Please schedule a follow-up appointment in 6 months. 2)  Neb neo nasal 3)  depo 80     Medication Administration  Injection # 1:    Medication: Depo- Medrol 80mg     Diagnosis: BRONCHITIS (ICD-490)    Route: SQ    Site: LUOQ gluteus    Exp Date: 04/2012    Lot #: [id number]    Mfr: [employer]    Patient tolerated injection without complications    Given by: Reynaldo Minium CMA (August 19, 2009 3:27 PM)  Medication # 1:    Medication: EMR miscellaneous medications    Diagnosis: BRONCHITIS (ICD-490)    Dose: 3drops    Route: intranasal    Exp Date: 10/2010    Lot #: [id number]    Mfr: [employer]  Comments: Neo-Synephrine    Patient tolerated medication without complications    Given by: Reynaldo Minium CMA (August 19, 2009 3:29 PM)  Orders Added: 1)  Nebulizer Tx [phone] 2)  Depo- Medrol 80mg  [J1040] 3)  Admin of Therapeutic Inj  intramuscular or subcutaneous [47829]

## 2010-02-13 NOTE — Progress Notes (Signed)
Summary: Refill Request  Phone Note Refill Request Call back at Fax: [phone] Message from:  [facility] [address]  Refills Requested: Medication #1:  Countor strips and lancets  Method Requested: Fax to Local Pharmacy Initial call taken by: Shonna Chock CMA,  November 05, 2009 2:57 PM  Follow-up for Phone Call        I called patient to question how many x daily he is checking bloodsugar and he indicated 4 x daily Follow-up by: Shonna Chock CMA,  November 05, 2009 3:02 PM    New/Updated Medications: BAYER CONTOUR TEST  STRP (GLUCOSE BLOOD) Check Bloodsugar 4 x daily LANCETS  MISC (LANCETS) Check bloodsugar 4 x daily  DX: 250.00 Prescriptions: LANCETS  MISC (LANCETS) Check bloodsugar 4 x daily  DX: 250.00  #81mo supply x 3   Entered by:   Shonna Chock CMA   Authorized by:   Marga Melnick MD   Signed by:   Shonna Chock CMA on 11/05/2009   Method used:   Electronically to        [facility] [address] [phone]* (retail)       [address]       [place], [place]  [address]       Ph: [phone]       Fax: [phone]   RxID:   [id number] BAYER CONTOUR TEST  STRP (GLUCOSE BLOOD) Check Bloodsugar 4 x daily  #77mo supply x 3   Entered by:   Shonna Chock CMA   Authorized by:   Marga Melnick MD   Signed by:   Shonna Chock CMA on 11/05/2009   Method used:   Electronically to        [facility] [address] [phone]* (retail)       [address]       [place], [place]  [address]       Ph: [phone]       Fax: [phone]   RxID:   [id number]

## 2010-02-13 NOTE — Assessment & Plan Note (Signed)
Summary: sob/cough//lmr   Primary Provider/Referring Provider:  Rockie Neighbours  CC:  Accute Visit-4 asthma attacks since Monday; cough increased causing vomiting-productive cough-yellow in color. Headache over left eye. [name]  History of Present Illness: 10/02/08- Bronchitis, hx urticaria Complains of clear postnasal drip, occasional nasal congestion, cough in drafts. Not purulent and antibiotic didn't help. More productive cough after meals, without reflux. Wife gets on computer and tellshim what drugs not to take. Neti pot not helpful. He thinks an as needed nasal spray didn't help. No headache, purulent or bloody discharge,  deep cough or wheeze, chest pain or palpitation.  December 04, 2008- Bronchitis, hx urticaria.............................William Kitchenwife here Increased cough x 2 days. Came yesterday for depo, but not better going into the holiday. Sore throat, maybe a little fever. Nonproductive. had been raking leaves all day just before this with no sick exposure. Tessalon not helping.  December 25, 2008- Bronchitis, hx urticaria...........................William Kitchenwife here Got well after last here, except that drainage never quit. Cough syrup not enough help. In last 2-3 days his cough returned, productive light yellow. Denies sore throat or fever. Head congested, no sinus pain. Ears ok. Denies GI upset. Had taken Zpak and Doxy. He says cortisone shot usually helps.  March 06, 2009- .........................William Kitchenwife here Carpet cleaned 2 days ago. He bagan coughing and wheezing, relieved only by his Proair. Coughs until he retches. Taking benzonatate and cough syrup. Deep breath triggers cough.  Current Medications (verified): 1)  Edecrin 25 Mg Tabs (Ethacrynic Acid) .[name].. 1 By Mouth Once Daily 2)  Synthroid 50 Mcg  Tabs (Levothyroxine Sodium) .... 3 Tabs Daily Except 4  On Weds 3)  Lanoxin 0.25 Mg  Tabs (Digoxin) .[name].. 1 By Mouth Once Daily Except 2 On Weds 4)  Dilt-Cd 240 Mg Xr24h-Cap (Diltiazem Hcl Coated  Beads) .... Take One Daily 5)  Welchol 625 Mg  Tabs (Colesevelam Hcl) .... 6 Tabs Once Daily Patient 6)  Aspirin 81 Mg  Tabs (Aspirin) .... Take 1 Tablet By Mouth Once A Day 7)  Ocuvite   Tabs (Multiple Vitamins-Minerals) .[name].. 1 By Mouth Two Times A Day 8)  Humulin R 100 Unit/ml Inj Soln (Insulin Regular Human) .... Sliding Scale 9)  Lantus 100 Unit/ml  Soln (Insulin Glargine) .... Uses 15-20 Units At Bedtime But Can Go As High As 35units Qhs 10)  Must Have Meds Listed Do Not Substitute Generics Patient All .... Patient Allergic To Dyes 11)  Vitamin D 1000 Unit  Caps (Cholecalciferol) .... Take 1 Capsule By Mouth Once A Day 12)  Zolpidem Tartrate 5 Mg  Tabs (Zolpidem Tartrate) .[name].. 1 At Bedtime Occasionally For Sleep 13)  Bd Insulin Syringe U-100 1 Ml  Misc (Insulin Syringes (Disposable)) .... Uses Three Times Daily 14)  Proair Hfa 108 (90 Base) Mcg/act  Aers (Albuterol Sulfate) .... Inhale 2 Puffs Four Times A Day As Needed For Shortness of Breath 15)  Omeprazole 20 Mg  Cpdr (Omeprazole) .[name].. 1 By Mouth Once Daily As Needed  Allergies (verified): 1)  ! Codeine 2)  ! * Tetanus 3)  ! Zocor 4)  ! Lipitor 5)  ! Zetia 6)  ! Crestor 7)  ! Percocet 8)  ! * Nitro 9)  ! * Dyes 10)  * Iv Dye  Past History:  Past Medical History: Last updated: 01/21/2009 Skin cancer, hx of Hypothyroidism 2007-prostate cancer left partial lobectomy for bronchiectasis age 61; asthmatic bronchitis allergic rhinitis uticara,PMH of, Dr Reather Laurence dysrrhythmia; intolerance to multiple statins & Zetia Diabetes mellitus, type II Macular  Degeneration , Dr Luciana Axe  Past Surgical History: Last updated: 04/12/2008 PAF 10/05-POPLITEAL CYSTECTOMY 6/06-GALLSTONES 8/07-PROSTATE CANCER; S/P  RADIATION TREATMENT 1/08-TKR R, 2/10-L PARTIAL THYROIDECTOMY 1985 FOR NODULES  Family History: Last updated: 04/11/2007 Coronary Heart Disease father MI times seven mother htn, cva age 88  Social History: Last updated:  06/17/2007 Patient never smoked.  retired married with 2 children  Risk Factors: Exercise: yes (01/12/2008)  Risk Factors: Smoking Status: never (01/21/2007)  Review of Systems      See HPI       The patient complains of dyspnea on exertion and prolonged cough.  The patient denies anorexia, fever, weight loss, weight gain, vision loss, decreased hearing, hoarseness, chest pain, syncope, peripheral edema, headaches, hemoptysis, and severe indigestion/heartburn.    Vital Signs:  Patient profile:   77 year old male Height:      71 inches Weight:      178.50 pounds BMI:     24.99 O2 Sat:      90 % on Room air Pulse rate:   51 / minute BP sitting:   124 / 68  (left arm) Cuff size:   regular  Vitals Entered By: Reynaldo Minium CMA (March 06, 2009 11:35 AM)  O2 Flow:  Room air  Physical Exam  Additional Exam:  General: A/Ox3; pleasant and cooperative, NAD, thin SKIN: no rash, lesions NODES: no lymphadenopathy HEENT: [place]/AT, EOM- WNL, Conjuctivae- clear, PERRLA, TM-bilat hearing aids, Nose- clear, Throat- clear and wnl, Melampatti II. No visible erythema or drainage NECK: Supple w/ fair ROM, JVD- none, normal carotid impulses w/o bruits Thyroid- CHEST: Diminished, shallow without dullness, mild wheeze HEART: RRR, no m/g/r heard ABDOMEN: slender EAV:WUJW, nl pulses, no edema  NEURO: Grossly intact to observation      Impression & Recommendations:  Problem # 1:  BRONCHITIS (ICD-490)  Acute asthmatic bronchitis exacerbation. Likely dust and mold stirred up by the carpet cleaning triggered this and a neb with depo will be sufficient. We will also refill his proair. His updated medication list for this problem includes:    Proair Hfa 108 (90 Base) Mcg/act Aers (Albuterol sulfate) ..... Inhale 2 puffs four times a day as needed for shortness of breath  Other Orders: Est. Patient Level II (11914)  Patient Instructions: 1)  Keep appointment March 31 2)  neb xop 1.25 3)   depo 40 4)  Refill proair script sent to your drug store. Prescriptions: PROAIR HFA 108 (90 BASE) MCG/ACT  AERS (ALBUTEROL SULFATE) Inhale 2 puffs four times a day as needed for shortness of breath  #1 x 11   Entered and Authorized by:   Waymon Budge MD   Signed by:   Waymon Budge MD on 03/06/2009   Method used:   Electronically to        [facility] [phone]* (retail)       [address]       [place], [place]  [address]       Ph: [phone]       Fax: [phone]   RxID:   [id number]

## 2010-02-13 NOTE — Letter (Signed)
Summary: [facility]  [facility]   Imported By: Lanelle Bal 03/06/2009 11:12:04  _____________________________________________________________________  External Attachment:    Type:   Image     Comment:   External Document

## 2010-02-13 NOTE — Medication Information (Signed)
Summary: Diabetic Shoes/[facility]  [facility]   Imported By: Lanelle Bal 06/24/2009 11:24:33  _____________________________________________________________________  External Attachment:    Type:   Image     Comment:   External Document

## 2010-02-13 NOTE — Progress Notes (Signed)
Summary: Switch back to [facility]  Phone Note Call from Patient Call back at [phone] or [phone]   Caller: Spouse Summary of Call: Pts wife calls and states that pt wants to switch back to using [facility] for the Diabetes Supplies because when it is sent to the local pharmacy they have to pay for it.  She says that Dr. Alwyn Ren has to call to have the rx sent back to [facility] [phone] Initial call taken by: Lavell Islam,  November 06, 2009 8:35 AM  Follow-up for Phone Call        RX's faxed to: [phone] Follow-up by: Shonna Chock CMA,  November 06, 2009 8:49 AM    Prescriptions: LANCETS  MISC (LANCETS) Check bloodsugar 4 x daily  DX: 250.00  #56mo supply x 3   Entered by:   Shonna Chock CMA   Authorized by:   Marga Melnick MD   Signed by:   Shonna Chock CMA on 11/06/2009   Method used:   Print then Give to Patient   RxID:   [id number] BAYER CONTOUR TEST  STRP (GLUCOSE BLOOD) Check Bloodsugar 4 x daily  #21mo supply x 3   Entered by:   Shonna Chock CMA   Authorized by:   Marga Melnick MD   Signed by:   Shonna Chock CMA on 11/06/2009   Method used:   Print then Give to Patient   RxID:   [id number]

## 2010-02-13 NOTE — Assessment & Plan Note (Signed)
Summary: rto 4 months/cbs   Vital Signs:  Patient profile:   77 year old male Weight:      170.4 pounds BMI:     23.85 Temp:     98.4 degrees F oral Pulse rate:   76 / minute Resp:     16 per minute BP sitting:   130 / 80  (left arm) Cuff size:   regular  Vitals Entered By: Shonna Chock CMA (December 02, 2009 2:58 PM) CC: 4 month follow-up ( patient with mailed copied) , Type 2 diabetes mellitus follow-up   Primary Care Provider:  Rockie Neighbours  CC:  4 month follow-up ( patient with mailed copied)  and Type 2 diabetes mellitus follow-up.  History of Present Illness:       Hyperlipidemia Follow-Up occasional  fatigue, but denies muscle aches, GI upset, abdominal pain, flushing, itching, constipation, and diarrhea.  The patient denies the following symptoms: chest pain/pressure, exercise intolerance, dypsnea, palpitations, syncope, and pedal edema.  Compliance with medications (by patient report) has been near 100%.  Dietary compliance has been fair.  The patient reports exercising 3 X per week.  Adjunctive measures currently used by the patient include ASA.   LD 154.7; he has been intolerant to all statins & even Zetia. Type 2 Diabetes Mellitus Follow-Up      The patient is also here for Type 2 diabetes mellitus follow-up.  The patient reports weight gain of 5 # , but denies polyuria, polydipsia, blurred vision, self managed hypoglycemia, and numbness of extremities.  The patient denies the following symptoms: neuropathic pain, orthostatic symptoms, poor wound healing, intermittent claudication, vision loss, and foot ulcer.  The patient has been measuring capillary blood glucose before breakfast (115 average), before lunch, before dinner, and at bedtime as per the supplier's recommendations.  Pre meals 155 & at bedtime 170-200. A1c is @ goal (6.7%).  Current Medications (verified): 1)  Furosemide 20 Mg Tabs (Furosemide) Dye Free .... Take 1 Tab Once Daily**dye Free** 2)  Synthroid 50 Mcg   Tabs (Levothyroxine Sodium) .... 3 Tabs Daily Except  4 Pills Weds & Sun 3)  Lanoxin 0.25 Mg  Tabs (Digoxin) .Marland Kitchen.. 1 By Mouth Once Daily Except 2 On Weds 4)  Dilt-Cd 240 Mg Xr24h-Cap (Diltiazem Hcl Coated Beads) .... Take One Daily 5)  Welchol 625 Mg  Tabs (Colesevelam Hcl) .... 6 Tabs Once Daily Patient 6)  Aspirin 81 Mg  Tabs (Aspirin) .... Take 1 Tablet By Mouth Once A Day 7)  Ocuvite   Tabs (Multiple Vitamins-Minerals) .Marland Kitchen.. 1 By Mouth Two Times A Day 8)  Humulin R 100 Unit/ml Inj Soln (Insulin Regular Human) .... Sliding Scale 9)  Lantus 100 Unit/ml  Soln (Insulin Glargine) .... Uses 15-20 Units At Bedtime But Can Go As High As 35units Qhs 10)  Must Have Meds Listed Do Not Substitute Generics Patient All .... Patient Allergic To Dyes 11)  Vitamin D 1000 Unit  Caps (Cholecalciferol) .... Take 1 Capsule By Mouth Once A Day 12)  Zolpidem Tartrate 5 Mg  Tabs (Zolpidem Tartrate) .Marland Kitchen.. 1 At Bedtime Occasionally For Sleep 13)  Bd Insulin Syringe U-100 1 Ml  Misc (Insulin Syringes (Disposable)) .... Uses Three Times Daily 14)  Proair Hfa 108 (90 Base) Mcg/act  Aers (Albuterol Sulfate) .... Inhale 2 Puffs Four Times A Day As Needed For Shortness of Breath 15)  Omeprazole 20 Mg  Cpdr (Omeprazole) .Marland Kitchen.. 1 By Mouth Once Daily As Needed 16)  Diabetic Shoes -250.00 .Marland Kitchen.. 1 Pair-  250.00 17)  Bayer Contour Test  Strp (Glucose Blood) .... Check Bloodsugar 4 X Daily 18)  Lancets  Misc (Lancets) .... Check Bloodsugar 4 X Daily  Dx: 250.00 19)  Benadryl 25 Mg Tabs (Diphenhydramine Hcl) .... Take 1 By Mouth Once Daily As Needed 20)  Benzonatate 100 Mg Caps (Benzonatate) .Marland Kitchen.. 1-2 Three Times A Day As Needed Cough  Allergies: 1)  ! Codeine 2)  ! * Tetanus 3)  ! Zocor 4)  ! Lipitor 5)  ! Zetia 6)  ! Crestor 7)  ! Percocet 8)  ! * Nitro 9)  ! * Dyes 10)  * Iv Dye  Physical Exam  General:  well-nourished,in no acute distress; alert,appropriate and cooperative throughout examination Lungs:  Normal  respiratory effort, chest expands symmetrically. Lungs are clear to auscultation, no crackles or wheezes. Heart:  Normal rate and regular rhythm. S1 and S2 normal without gallop, murmur, click, rub . Pulses:  R and L carotid,radial,dorsalis pedis and posterior tibial pulses are full and equal bilaterally Extremities:  No clubbing, cyanosis, edema. Marked R great toenail deformity  Neurologic:  alert & oriented X3 and sensation intact to light touch  over feet.   Skin:  Intact without suspicious lesions or rashes Psych:  memory intact for recent and remote, normally interactive, and good eye contact.     Impression & Recommendations:  Problem # 1:  HYPERLIPIDEMIA (ICD-272.2) LDL not @ goal but multiple class drug intolerances limit options His updated medication list for this problem includes:    Welchol 625 Mg Tabs (Colesevelam hcl) .Marland KitchenMarland KitchenMarland KitchenMarland Kitchen 6 tabs once daily patient  Problem # 2:  ATRIAL FIBRILLATION, PAROXYSMAL (ICD-427.31) RR @ present; Digoxin level high normal His updated medication list for this problem includes:    Lanoxin 0.25 Mg Tabs (Digoxin) .Marland Kitchen... 1 by mouth once daily    Dilt-cd 240 Mg Xr24h-cap (Diltiazem hcl coated beads) .Marland Kitchen... Take one daily    Aspirin 81 Mg Tabs (Aspirin) .Marland Kitchen... Take 1 tablet by mouth once a day  Problem # 3:  HYPOTHYROIDISM (ICD-244.9) TSH therapeutic His updated medication list for this problem includes:    Synthroid 50 Mcg Tabs (Levothyroxine sodium) .Marland KitchenMarland KitchenMarland KitchenMarland Kitchen 3 tabs daily except  4 pills weds & sun  Problem # 4:  DM (ICD-250.00) A1c @ goal His updated medication list for this problem includes:    Aspirin 81 Mg Tabs (Aspirin) .Marland Kitchen... Take 1 tablet by mouth once a day    Humulin R 100 Unit/ml Inj Soln (Insulin regular human) ..... Sliding scale    Lantus 100 Unit/ml Soln (Insulin glargine) ..... Uses 15-20 units at bedtime but can go as high as 35units qhs  Complete Medication List: 1)  Furosemide 20 Mg Tabs (furosemide) Dye Free  .... Take 1 tab once  daily**dye free** 2)  Synthroid 50 Mcg Tabs (Levothyroxine sodium) .... 3 tabs daily except  4 pills weds & sun 3)  Lanoxin 0.25 Mg Tabs (Digoxin) .Marland Kitchen.. 1 by mouth once daily 4)  Dilt-cd 240 Mg Xr24h-cap (Diltiazem hcl coated beads) .... Take one daily 5)  Welchol 625 Mg Tabs (Colesevelam hcl) .... 6 tabs once daily patient 6)  Aspirin 81 Mg Tabs (Aspirin) .... Take 1 tablet by mouth once a day 7)  Ocuvite Tabs (Multiple vitamins-minerals) .Marland Kitchen.. 1 by mouth two times a day 8)  Humulin R 100 Unit/ml Inj Soln (Insulin regular human) .... Sliding scale 9)  Lantus 100 Unit/ml Soln (Insulin glargine) .... Uses 15-20 units at bedtime but can go as high  as 35units qhs 10)  Must Have Meds Listed Do Not Substitute Generics Patient All  .... Patient allergic to dyes 11)  Vitamin D 1000 Unit Caps (Cholecalciferol) .... Take 1 capsule by mouth once a day 12)  Zolpidem Tartrate 5 Mg Tabs (Zolpidem tartrate) .Marland Kitchen.. 1 at bedtime occasionally for sleep 13)  Bd Insulin Syringe U-100 1 Ml Misc (Insulin syringes (disposable)) .... Uses three times daily 14)  Proair Hfa 108 (90 Base) Mcg/act Aers (Albuterol sulfate) .... Inhale 2 puffs four times a day as needed for shortness of breath 15)  Omeprazole 20 Mg Cpdr (Omeprazole) .Marland Kitchen.. 1 by mouth once daily as needed 16)  Diabetic Shoes -250.00  .Marland Kitchen.. 1 pair-  250.00 17)  Bayer Contour Test Strp (Glucose blood) .... Check bloodsugar 4 x daily 18)  Lancets Misc (Lancets) .... Check bloodsugar 4 x daily  dx: 250.00 19)  Benadryl 25 Mg Tabs (Diphenhydramine hcl) .... Take 1 by mouth once daily as needed 20)  Benzonatate 100 Mg Caps (Benzonatate) .Marland Kitchen.. 1-2 three times a day as needed cough  Patient Instructions: 1)  Decrease Digoxin(Lanoxin)  to 1 pill once daily ; Lanoxin leve was high normal. 2)  Check your blood sugars regularly.Fasting glucose M, W , F & Sun. Two hrs  after b'fast on Tues, after lunch on Thurs & 2 hrs after eve meal on Sat. Goal = am sugar 90-150 & 2 hrs  after any meal < 180. 3)  Please schedule a follow-up appointment in 4 months. 4)  HbgA1C prior to visit, ICD-9:250.00   Orders Added: 1)  Est. Patient Level IV [13086]

## 2010-02-13 NOTE — Progress Notes (Signed)
Summary: Medication concerns  Phone Note From Pharmacy Call back at [phone]   Caller: [facility] Summary of Call: Message left on VM; Please call to discuss if any Generic of Dilt-CD 240mg  is ok. Please call to discuss reason for a specific manufacture on this med, what is the medical reason. Patient's ref # [id number].   I called patient and requested that he call to futher discuss if he is ok with another generic manufacture for this med .Shonna Chock  January 29, 2009 12:23 PM   Follow-up for Phone Call        Pt is okay with taking the generic. Army Fossa CMA  January 29, 2009 1:43 PM  Additional Follow-up for Phone Call Additional follow up Details #1::        Called [facility] and discussed that patient is ok with generic med from a differnt manufacture but it is noted on his adverse list that he has hives with certian dyes in meds.  [facility] rep said that is a vaild reason and they will continue to dispense what the patient had in the past Additional Follow-up by: Shonna Chock,  January 29, 2009 2:00 PM

## 2010-02-13 NOTE — Medication Information (Signed)
Summary: Diabetes Supplies/[facility]  [facility]   Imported By: Lanelle Bal 01/20/2010 09:42:14  _____________________________________________________________________  External Attachment:    Type:   Image     Comment:   External Document

## 2010-02-13 NOTE — Medication Information (Signed)
Summary: [facility]  [facility]lies/[facility]   Imported By: Lanelle Bal 11/28/2009 14:05:02  _____________________________________________________________________  External Attachment:    Type:   Image     Comment:   External Document

## 2010-02-13 NOTE — Progress Notes (Signed)
Summary: chest congestion, diarhea from abx  Phone Note Call from Patient Call back at [phone]   Caller: Patient Call For: young Summary of Call: cough and chest congestion [facility] [address] Initial call taken by: Rickard Patience,  November 22, 2009 12:14 PM  Follow-up for Phone Call        Spoke with pt.  He started zpack on 11/20/09 that we called in for him for cough and rhinitis.  He states that his cough has improved some, no longer coughing up sputum, but he states that his runny nose is no better and overall "just feels bad".  Any further recs? Pls advise thanks Allergies:  1)  ! Codeine 2)  ! * Tetanus 3)  ! Zocor 4)  ! Lipitor 5)  ! Zetia 6)  ! Crestor 7)  ! Percocet 8)  ! * Nitro 9)  ! * Dyes 10)  * Iv Dye  Follow-up by: Vernie Murders,  November 22, 2009 2:15 PM  Additional Follow-up for Phone Call Additional follow up Details #1::        Per CY, suggest try antihistamine like loratadine otc.  Gweneth Dimitri RN  November 22, 2009 3:15 PM   Called, spoke with pt.  He was informed of above recs per CY and verbalized understanding.  Pt states he started having diarhea today from zpak -- would like to know if he should cont this.  Pls advise.  Thanks! Additional Follow-up by: Gweneth Dimitri RN,  November 22, 2009 3:18 PM    Additional Follow-up for Phone Call Additional follow up Details #2::    Per CY, suggest he take pepto bismol, UAD, 2 hours after taking zpak dose. Gweneth Dimitri RN  November 22, 2009 3:23 PM   Called, spoke with pt.  He was informed of above per CY and verbalized understanding. Gweneth Dimitri RN  November 22, 2009 3:25 PM

## 2010-02-13 NOTE — Assessment & Plan Note (Signed)
Summary: sick visit/kcw   Primary Provider/Referring Provider:  Rockie Neighbours  CC:  Acute visit-Increased dry cough at night; stopped up/pressure nasal congestion. Finish Zpak yesterday. Marland Kitchen  History of Present Illness: March 06, 2009- .........................Marland Kitchenwife here Carpet cleaned 2 days ago. He bagan coughing and wheezing, relieved only by his Proair. Coughs until he retches. Taking benzonatate and cough syrup. Deep breath triggers cough.  August 19, 2009- Bronchitis, hx Urticaria Moving soon to [facility]. complains now of cough and sinus drainage x 3 weeks. Clear mucus mostly from his sinuses, not much from chest, but it causes throat tickle cough. Mild frontal and retroorbital headaches with no fever or purulent. has been stirring up dust, and outdoors a lot running a yard sale. He thinks depo and neb help  best. Benadryl helps without drowsiness.  November 25, 2009- Bronchitis, hx Urticaria........Marland Kitchenwife here Nurse-CC: Bronchitis, hx Urticaria Acute visit.Called Nov 9 with malaise, discolored sputum> Zpak.  Recent antibiotics cleared discolored sputum but he just doesn't feel well. Sustained cough at night. Hearing aid out- trouble hearing me, but he says not coughing out much. Frontal headache, no fever, no GI upset. He says Depo shot helps even though he knows it will run up his sugar for a bit.  They moved into [place].     Preventive Screening-Counseling & Management  Alcohol-Tobacco     Alcohol drinks/day: 0     Smoking Status: never  Current Medications (verified): 1)  Furosemide 20 Mg Tabs (Furosemide) Dye Free .... Take 1 Tab Once Daily**dye Free** 2)  Synthroid 50 Mcg  Tabs (Levothyroxine Sodium) .... 3 Tabs Daily Except  4 Pills Weds & Sun 3)  Lanoxin 0.25 Mg  Tabs (Digoxin) .Marland Kitchen.. 1 By Mouth Once Daily Except 2 On Weds 4)  Dilt-Cd 240 Mg Xr24h-Cap (Diltiazem Hcl Coated Beads) .... Take One Daily 5)  Welchol 625 Mg  Tabs (Colesevelam Hcl) .... 6 Tabs Once Daily  Patient 6)  Aspirin 81 Mg  Tabs (Aspirin) .... Take 1 Tablet By Mouth Once A Day 7)  Ocuvite   Tabs (Multiple Vitamins-Minerals) .Marland Kitchen.. 1 By Mouth Two Times A Day 8)  Humulin R 100 Unit/ml Inj Soln (Insulin Regular Human) .... Sliding Scale 9)  Lantus 100 Unit/ml  Soln (Insulin Glargine) .... Uses 15-20 Units At Bedtime But Can Go As High As 35units Qhs 10)  Must Have Meds Listed Do Not Substitute Generics Patient All .... Patient Allergic To Dyes 11)  Vitamin D 1000 Unit  Caps (Cholecalciferol) .... Take 1 Capsule By Mouth Once A Day 12)  Zolpidem Tartrate 5 Mg  Tabs (Zolpidem Tartrate) .Marland Kitchen.. 1 At Bedtime Occasionally For Sleep 13)  Bd Insulin Syringe U-100 1 Ml  Misc (Insulin Syringes (Disposable)) .... Uses Three Times Daily 14)  Proair Hfa 108 (90 Base) Mcg/act  Aers (Albuterol Sulfate) .... Inhale 2 Puffs Four Times A Day As Needed For Shortness of Breath 15)  Omeprazole 20 Mg  Cpdr (Omeprazole) .Marland Kitchen.. 1 By Mouth Once Daily As Needed 16)  Diabetic Shoes -250.00 .Marland Kitchen.. 1 Pair-  250.00 17)  Bayer Contour Test  Strp (Glucose Blood) .... Check Bloodsugar 4 X Daily 18)  Lancets  Misc (Lancets) .... Check Bloodsugar 4 X Daily  Dx: 250.00 19)  Benadryl 25 Mg Tabs (Diphenhydramine Hcl) .... Take 1 By Mouth Once Daily As Needed  Allergies (verified): 1)  ! Codeine 2)  ! * Tetanus 3)  ! Zocor 4)  ! Lipitor 5)  ! Zetia 6)  ! Crestor 7)  !  Percocet 8)  ! * Nitro 9)  ! * Dyes 10)  * Iv Dye  Past History:  Family History: Last updated: 08/23/2009 sister: Ophth disease father: MI times seven mother: htn, cva  @ age 63  Social History: Last updated: 08/23/2009 retired married with 2 children  Risk Factors: Alcohol Use: 0 (11/25/2009) Caffeine Use: 1-2 cups/ day (08/23/2009) Diet: no excess sweets (08/23/2009) Exercise: yes (08/23/2009)  Risk Factors: Smoking Status: never (11/25/2009)  Past Medical History: Skin cancer, PMH  of Hypothyroidism,  Prostate cancer, PMH of 2007, Dr  Vonita Moss asthmatic bronchitis allergic rhinitis Urticara,PMH of, Dr Reather Laurence dyslipidemia ; intolerance to multiple statins & Zetia Diabetes mellitus, type II Macular Degeneration , Dr Luciana Axe  Past Surgical History: PAF, PMH of 2005-POPLITEAL CYSTECTOMY 2006-GALLSTONES( no cholecystectomy) 2007-PROSTATE CANCER; S/P  RADIATION TREATMENT 2008-TKR on   R, 2010 on L PARTIAL THYROIDECTOMY 1985 FOR NODULES Left partial lobectomy for bronchiectasis age 78  Review of Systems      See HPI       The patient complains of shortness of breath with activity, non-productive cough, and nasal congestion/difficulty breathing through nose.  The patient denies shortness of breath at rest, coughing up blood, chest pain, irregular heartbeats, acid heartburn, indigestion, loss of appetite, weight change, abdominal pain, difficulty swallowing, sore throat, tooth/dental problems, headaches, sneezing, itching, ear ache, hand/feet swelling, rash, change in color of mucus, and fever.    Vital Signs:  Patient profile:   77 year old male Height:      71 inches Weight:      174.13 pounds BMI:     24.37 O2 Sat:      97 % on Room air Pulse rate:   72 / minute BP sitting:   140 / 74  (left arm) Cuff size:   regular  Vitals Entered By: Reynaldo Minium CMA (November 25, 2009 11:56 AM)  O2 Flow:  Room air CC: Acute visit-Increased dry cough at night; stopped up/pressure nasal congestion. Finish Zpak yesterday.    Physical Exam  Additional Exam:  General: A/Ox3; pleasant and cooperative, NAD, thin and frail looking SKIN: no rash, lesions NODES: no lymphadenopathy HEENT: [place]/AT, EOM- WNL, Conjuctivae- clear, PERRLA, TM-bilat hearing aids, Nose- clear, Throat- clear and wnl, Mallampati II. No visible erythema or drainage. Raspy voice. NECK: Supple w/ fair ROM, JVD- none, normal carotid impulses w/o bruits Thyroid- CHEST: Very clear.  HEART: RRR, no m/g/r heard ABDOMEN: slender NFA:OZHY, nl pulses, no edema    NEURO: Grossly intact to observation      Impression & Recommendations:  Problem # 1:  BRONCHITIS (ICD-490)  Recent persistent rhinitis/ bronchitis syndrome. Biggest problem may be geriatric failure to thrive. We will try the depo shot he asks for, and sample Patanase w/ refill benzonatate. He looks older and more frail. The following medications were removed from the medication list:    Zithromax Z-pak 250 Mg Tabs (Azithromycin) .Marland Kitchen... As directed His updated medication list for this problem includes:    Proair Hfa 108 (90 Base) Mcg/act Aers (Albuterol sulfate) ..... Inhale 2 puffs four times a day as needed for shortness of breath    Benzonatate 100 Mg Caps (Benzonatate) .Marland Kitchen... 1-2 three times a day as needed cough  Medications Added to Medication List This Visit: 1)  Benadryl 25 Mg Tabs (Diphenhydramine hcl) .... Take 1 by mouth once daily as needed 2)  Benzonatate 100 Mg Caps (Benzonatate) .Marland Kitchen.. 1-2 three times a day as needed cough  Other Orders: Prescription Created Electronically (  Chance.Elder) Depo- Medrol 80mg  (J1040) Admin of Therapeutic Inj  intramuscular or subcutaneous (16109)  Patient Instructions: 1)  Please schedule a follow-up appointment in 4 months. 2)  Script for benzonatate cough pearls sent to drug store 3)  Sample Patanase nasal spray 4)    1-2 puffs each nostril as needed for sinus drainage 5)  depo 80 Prescriptions: BENZONATATE 100 MG CAPS (BENZONATATE) 1-2 three times a day as needed cough  #50 x 1   Entered and Authorized by:   Waymon Budge MD   Signed by:   Waymon Budge MD on 11/25/2009   Method used:   Electronically to        [facility] [address] [phone]* (retail)       [address]       [place], [place]  [address]       Ph: [phone]       Fax: [phone]   RxID:   [id number]      Medication Administration  Injection # 1:    Medication: Depo- Medrol 80mg     Diagnosis: BRONCHITIS (ICD-490)    Route: IM    Site: LUOQ gluteus     Exp Date: 12/25/2009    Lot #: [id number]    Mfr: Teva    Patient tolerated injection without complications    Given by: Renold Genta RCP, LPN (November 25, 2009 12:53 PM)  Orders Added: 1)  Prescription Created Electronically [id number] 2)  Depo- Medrol 80mg  [J1040] 3)  Admin of Therapeutic Inj  intramuscular or subcutaneous [96372]   Appended Document: Orders Update     Clinical Lists Changes  Orders: Added new Service order of Est. Patient Level III (66440) - Signed

## 2010-02-13 NOTE — Assessment & Plan Note (Signed)
Summary: flu shot/mhh   Nurse Visit   Allergies: 1)  ! Codeine 2)  ! * Tetanus 3)  ! Zocor 4)  ! Lipitor 5)  ! Zetia 6)  ! Crestor 7)  ! Percocet 8)  ! * Nitro 9)  ! * Dyes 10)  * Iv Dye  Orders Added: 1)  Flu Vaccine 19yrs + MEDICARE PATIENTS [Q2039] 2)  Administration Flu vaccine - MCR [G0008] Flu Vaccine Consent Questions     Do you have a history of severe allergic reactions to this vaccine? no    Any prior history of allergic reactions to egg and/or gelatin? no    Do you have a sensitivity to the preservative Thimersol? no    Do you have a past history of Guillan-Barre Syndrome? no    Do you currently have an acute febrile illness? no    Have you ever had a severe reaction to latex? no    Vaccine information given and explained to patient? yes    Are you currently pregnant? no    Lot Number:[id number]   Exp Date:07/12/2010   Site Given  Left Deltoid IMu Clarise Cruz [facility])  September 30, 2009 11:32 AM

## 2010-02-13 NOTE — Progress Notes (Signed)
Summary: appt  Phone Note Call from Patient Call back at Home Phone [phone]   Caller: [name] Call For: [name] Reason for Call: Talk to Nurse Summary of Call: cough so bad can't breath. vomits, feels bad, getting up dark plegm.  Has been taking meds CDY gave him, but still really bad.  Wants to see CDY today. Initial call taken by: Eugene Gavia,  March 06, 2009 8:05 AM  Follow-up for Phone Call        Spoke with pt's wife.  Pt c/o prod cough with dark green sputum, wife states that pt coughs violently unil vomits.  Pt also c/o increased SOB.  OV with CDY sched for 11:15 am today.  Spouse aware to have pt here at 11 am per KW. Follow-up by: Vernie Murders,  March 06, 2009 8:47 AM

## 2010-02-13 NOTE — Medication Information (Signed)
Summary: Revised Request for Diabetic Shoes/[facility]  Revised Request for Diabetic Shoes/[facility]   Imported By: Lanelle Bal 06/24/2009 13:13:48  _____________________________________________________________________  External Attachment:    Type:   Image     Comment:   External Document

## 2010-02-14 ENCOUNTER — Other Ambulatory Visit: Payer: Self-pay | Admitting: Internal Medicine

## 2010-02-14 ENCOUNTER — Encounter: Payer: Self-pay | Admitting: Internal Medicine

## 2010-02-14 ENCOUNTER — Ambulatory Visit (INDEPENDENT_AMBULATORY_CARE_PROVIDER_SITE_OTHER): Payer: Medicare Other | Admitting: Internal Medicine

## 2010-02-14 DIAGNOSIS — L509 Urticaria, unspecified: Secondary | ICD-10-CM

## 2010-02-14 DIAGNOSIS — E119 Type 2 diabetes mellitus without complications: Secondary | ICD-10-CM

## 2010-02-14 LAB — HEMOGLOBIN A1C
Hgb A1c MFr Bld: 6.8 % — ABNORMAL HIGH (ref 4.6–6.5)
Hgb A1c MFr Bld: 6.8 % — ABNORMAL HIGH (ref 4.6–6.5)

## 2010-02-14 LAB — MICROALBUMIN / CREATININE URINE RATIO
Microalb Creat Ratio: 1.6 mg/g (ref 0.0–30.0)
Microalb, Ur: 3.8 mg/dL — ABNORMAL HIGH (ref 0.0–1.9)
Microalb, Ur: 3.8 mg/dL — ABNORMAL HIGH (ref 0.0–1.9)

## 2010-02-17 ENCOUNTER — Ambulatory Visit: Payer: Self-pay | Admitting: Internal Medicine

## 2010-02-19 NOTE — Assessment & Plan Note (Signed)
Summary: break out of hives/kn   Vital Signs:  Patient profile:   77 year old male Weight:      172.6 pounds BMI:     24.16 Temp:     97.8 degrees F oral Pulse rate:   72 / minute Resp:     15 per minute BP sitting:   140 / 82  (left arm) Cuff size:   regular  Vitals Entered By: Shonna Chock CMA (February 14, 2010 9:59 AM) CC: Break out of hives , Rash   Primary Care Provider:  Rockie Neighbours  CC:  Break out of hives  and Rash.  History of Present Illness:    Hives recurred @ approx 3 am today; his only new meds have been  Aleve for shoulder pain from rotator cuff tear being treated by Dr Lajoyce Corners. .His  last  dose of Aleve was  Weds 02/01  & generic Protonix last week. The patient reports hives, welts, and itching, but denies increased warmth and tenderness.  The rash is located on the abdomen @ waist last and on  right  & and left leg @ medial  thighs & LLE posteriorly this am.  Associated symptoms include burning.  The patient denies the following symptoms: fever, facial swelling, tongue swelling, difficulty breathing, Rx: Benadryl  . Cyclospor 25 mg two times a day Rxed by Dr Rosita Fire in 2006 helped ; he is requesting refill.  Current Medications (verified): 1)  Furosemide 20 Mg Tabs (Furosemide) Dye Free .... Take 1 Tab Once Daily**dye Free** 2)  Synthroid 50 Mcg  Tabs (Levothyroxine Sodium) .... 3 Tabs Daily Except  4 Pills Weds & Sun 3)  Lanoxin 0.25 Mg  Tabs (Digoxin) .Marland Kitchen.. 1 By Mouth Once Daily 4)  Dilt-Cd 240 Mg Xr24h-Cap (Diltiazem Hcl Coated Beads) .... Take One Daily 5)  Welchol 625 Mg  Tabs (Colesevelam Hcl) .... 6 Tabs Once Daily Patient 6)  Aspirin 81 Mg  Tabs (Aspirin) .... Take 1 Tablet By Mouth Once A Day 7)  Ocuvite   Tabs (Multiple Vitamins-Minerals) .Marland Kitchen.. 1 By Mouth Two Times A Day 8)  Humulin R 100 Unit/ml Inj Soln (Insulin Regular Human) .... Sliding Scale 9)  Lantus 100 Unit/ml  Soln (Insulin Glargine) .... Uses 15-20 Units At Bedtime But Can Go As High As 35units Qhs 10)   Must Have Meds Listed Do Not Substitute Generics Patient All .... Patient Allergic To Dyes 11)  Vitamin D 1000 Unit  Caps (Cholecalciferol) .... Take 1 Capsule By Mouth Once A Day 12)  Zolpidem Tartrate 5 Mg  Tabs (Zolpidem Tartrate) .Marland Kitchen.. 1 At Bedtime Occasionally For Sleep 13)  Bd Insulin Syringe U-100 1 Ml  Misc (Insulin Syringes (Disposable)) .... Uses Three Times Daily 14)  Proair Hfa 108 (90 Base) Mcg/act  Aers (Albuterol Sulfate) .... Inhale 2 Puffs Four Times A Day As Needed For Shortness of Breath 15)  Prilosec Otc 20 Mg Tbec (Omeprazole Magnesium) .Marland Kitchen.. 1 By Mouth Once Daily 16)  Diabetic Shoes -250.00 .Marland Kitchen.. 1 Pair-  250.00 17)  Bayer Contour Test  Strp (Glucose Blood) .... Check Bloodsugar 4 X Daily 18)  Lancets  Misc (Lancets) .... Check Bloodsugar 4 X Daily  Dx: 250.00 19)  Benadryl 25 Mg Tabs (Diphenhydramine Hcl) .... Take 1 By Mouth Once Daily As Needed 20)  Benzonatate 100 Mg Caps (Benzonatate) .Marland Kitchen.. 1-2 Three Times A Day As Needed Cough 21)  Tetracycline Hcl 500 Mg Caps (Tetracycline Hcl) .Marland Kitchen.. 1 Two Times A Day X 7  Days 22)  Aleve 220 Mg Tabs (Naproxen Sodium) .... 2 By Mouth Once Daily  Allergies: 1)  ! Codeine 2)  ! * Tetanus 3)  ! Zocor 4)  ! Lipitor 5)  ! Zetia 6)  ! Crestor 7)  ! Percocet 8)  ! * Nitro 9)  ! * Dyes 10)  * Iv Dye  Physical Exam  General:  in no acute distress; alert,appropriate and cooperative throughout examination Eyes:  No corneal or conjunctival inflammation noted. EOMI. poor vision  due to Mac  Degeneration Nose:  External nasal examination shows no deformity or inflammation. Nasal mucosa are pink and moist without lesions or exudates. Septal dislocation Mouth:  Oral mucosa and oropharynx without lesions or exudates.  No pharyngeal erythema.   Lungs:  Normal respiratory effort, chest expands symmetrically. Lungs are clear to auscultation, no crackles or wheezes. Heart:  Normal rate and regular rhythm. S1 and S2 normal without gallop, murmur,  click, rub . S4 with slurring Abdomen:  Bowel sounds positive,abdomen soft and non-tender without masses, organomegaly. Smal ventral  hernia  noted. Extremities:  No clubbing, cyanosis, edema, or deformity noted with normal full range of motion of all joints.  Crepitus of knees  Neurologic:  alert & oriented X3.   Skin:  Intact without suspicious lesions or rashes, specifically NO urticaria. Very mild Dermatographia Cervical Nodes:  No lymphadenopathy noted Axillary Nodes:  No palpable lymphadenopathy Psych:  memory intact for recent and remote, normally interactive, and good eye contact.     Impression & Recommendations:  Problem # 1:  URTICARIA (ICD-708.9)  probably from Aleve; risks of Cyclosporine reviewed [place] Pharmacopoeia  Orders: TLB-A1C / Hgb A1C (Glycohemoglobin) (83036-A1C) TLB-Microalbumin/Creat Ratio, Urine (82043-MALB) Prescription Created Electronically [id number])  Problem # 2:  DM (ICD-250.00)  His updated medication list for this problem includes:    Aspirin 81 Mg Tabs (Aspirin) .Marland Kitchen... Take 1 tablet by mouth once a day    Humulin R 100 Unit/ml Inj Soln (Insulin regular human) ..... Sliding scale    Lantus 100 Unit/ml Soln (Insulin glargine) ..... Uses 15-20 units at bedtime but can go as high as 35units qhs  Orders: TLB-A1C / Hgb A1C (Glycohemoglobin) (83036-A1C) TLB-Microalbumin/Creat Ratio, Urine (82043-MALB)  Complete Medication List: 1)  Furosemide 20 Mg Tabs (furosemide) Dye Free  .... Take 1 tab once daily**dye free** 2)  Synthroid 50 Mcg Tabs (Levothyroxine sodium) .... 3 tabs daily except  4 pills weds & sun 3)  Lanoxin 0.25 Mg Tabs (Digoxin) .Marland Kitchen.. 1 by mouth once daily 4)  Dilt-cd 240 Mg Xr24h-cap (Diltiazem hcl coated beads) .... Take one daily 5)  Welchol 625 Mg Tabs (Colesevelam hcl) .... 6 tabs once daily patient 6)  Aspirin 81 Mg Tabs (Aspirin) .... Take 1 tablet by mouth once a day 7)  Ocuvite Tabs (Multiple vitamins-minerals) .Marland Kitchen.. 1 by mouth two  times a day 8)  Humulin R 100 Unit/ml Inj Soln (Insulin regular human) .... Sliding scale 9)  Lantus 100 Unit/ml Soln (Insulin glargine) .... Uses 15-20 units at bedtime but can go as high as 35units qhs 10)  Must Have Meds Listed Do Not Substitute Generics Patient All  .... Patient allergic to dyes 11)  Vitamin D 1000 Unit Caps (Cholecalciferol) .... Take 1 capsule by mouth once a day 12)  Zolpidem Tartrate 5 Mg Tabs (Zolpidem tartrate) .Marland Kitchen.. 1 at bedtime occasionally for sleep 13)  Bd Insulin Syringe U-100 1 Ml Misc (Insulin syringes (disposable)) .... Uses three times daily 14)  Proair Hfa  108 (90 Base) Mcg/act Aers (Albuterol sulfate) .... Inhale 2 puffs four times a day as needed for shortness of breath 15)  Prilosec Otc 20 Mg Tbec (Omeprazole magnesium) .Marland Kitchen.. 1 by mouth once daily 16)  Diabetic Shoes -250.00  .Marland Kitchen.. 1 pair-  250.00 17)  Bayer Contour Test Strp (Glucose blood) .... Check bloodsugar 4 x daily 18)  Lancets Misc (Lancets) .... Check bloodsugar 4 x daily  dx: 250.00 19)  Benadryl 25 Mg Tabs (Diphenhydramine hcl) .... Take 1 by mouth once daily as needed 20)  Benzonatate 100 Mg Caps (Benzonatate) .Marland Kitchen.. 1-2 three times a day as needed cough 21)  Tetracycline Hcl 500 Mg Caps (Tetracycline hcl) .Marland Kitchen.. 1 two times a day x 7 days 22)  Aleve 220 Mg Tabs (Naproxen sodium) .... 2 by mouth once daily 23)  Hydroxyzine Pamoate 25 Mg Caps (Hydroxyzine pamoate) .Marland Kitchen.. 1 every 6 hrs as needed for itching , hives 24)  Ranitidine Hcl 150 Mg Tabs (Ranitidine hcl) .Marland Kitchen.. 1 two times a day pre meals in place of pantooprazole or prilosec ; this will treat hives  Patient Instructions: 1)  Avoid Aleve. Go to Web MD for possible Hives/ Urticaria  triggers. 2)  Check your blood sugars regularly. If your readings are usually above :150 or below 90 you should contact our office. 3)  Check your feet each night for sore areas, calluses or signs of infection. Prescriptions: RANITIDINE HCL 150 MG TABS (RANITIDINE  HCL) 1 two times a day pre meals in place of Pantooprazole or Prilosec ; this will treat hives  #60 x 2   Entered and Authorized by:   Marga Melnick MD   Signed by:   Marga Melnick MD on 02/14/2010   Method used:   Electronically to        [facility] [address] [phone]* (retail)       [address]       [place], [place]  [address]       Ph: [phone]       Fax: [phone]   RxID:   [id number] HYDROXYZINE PAMOATE 25 MG CAPS (HYDROXYZINE PAMOATE) 1 every 6 hrs as needed for itching , hives  #30 x 0   Entered and Authorized by:   Marga Melnick MD   Signed by:   Marga Melnick MD on 02/14/2010   Method used:   Electronically to        [facility] [address] [phone]* (retail)       [address]       [place], [place]  [address]       Ph: [phone]       Fax: [phone]   RxID:   [id number]    Orders Added: 1)  Est. Patient Level IV [37106] 2)  TLB-A1C / Hgb A1C (Glycohemoglobin) [83036-A1C] 3)  TLB-Microalbumin/Creat Ratio, Urine [82043-MALB] 4)  Prescription Created Electronically [phone]  Appended Document: break out of hives/kn

## 2010-02-19 NOTE — Progress Notes (Signed)
Summary: cancel refill for Pantoprazole  Phone Note Call from Patient Call back at Home Phone [phone]   Caller: Spouse Summary of Call: patient has broken out in a rash (hives) and thinks it might be the Pantoprazole---wants to cancel his refill request and go back to Prilosec---patient says he doesnt need a doctor appointment at this time Initial call taken by: Jerolyn Shin,  February 10, 2010 2:27 PM  Follow-up for Phone Call        Noted, I also spoke with patient's wife and informed her that they do not have to pick-up rx from the pharmacy or they can call and cancel rx. Patient to be seen if rash does not clear up, did not feel the need to be seen at the time of call Follow-up by: Shonna Chock CMA,  February 10, 2010 2:54 PM     Appended Document: cancel refill for Pantoprazole

## 2010-02-19 NOTE — Progress Notes (Signed)
Summary: Pantoprazole refill   Phone Note Refill Request Message from:  Fax from Pharmacy on February 07, 2010 10:39 AM  Refills Requested: Medication #1:  PANTOPRAZOLE SOD DR 40 MG TAB   Notes: QTY = 30;    Take 1 tablet by mouth once daily [facility] [id number],  [address], [place], [place]     phone  [phone],  fax - [phone]    qty = 30       I did  not see this product listed on his meds list  Next Appointment Scheduled: none Initial call taken by: Jerolyn Shin,  February 07, 2010 11:06 AM  Follow-up for Phone Call        Left message on machine for patient to return call when avaliable, Reason for call:   Discuss refill, we have another acid reflux med on list. Need to clarify what patient is actually taking. Follow-up by: Shonna Chock CMA,  February 07, 2010 4:23 PM  Additional Follow-up for Phone Call Additional follow up Details #1::        Patient's wife called back (patient was in the back ground) indicating he is taking Pantoprazole and not omeprazole.  Med changed on med list and refilled Additional Follow-up by: Shonna Chock CMA,  February 10, 2010 9:19 AM    New/Updated Medications: PANTOPRAZOLE SODIUM 40 MG TBEC (PANTOPRAZOLE SODIUM) 1 by mouth once daily Prescriptions: PANTOPRAZOLE SODIUM 40 MG TBEC (PANTOPRAZOLE SODIUM) 1 by mouth once daily  #30 x 5   Entered by:   Shonna Chock CMA   Authorized by:   Marga Melnick MD   Signed by:   Shonna Chock CMA on 02/10/2010   Method used:   Electronically to        [facility] [address] [phone]* (retail)       [address]       [place], [place]  [address]       Ph: [phone]       Fax: [phone]   RxID:   [id number]

## 2010-03-03 ENCOUNTER — Telehealth (INDEPENDENT_AMBULATORY_CARE_PROVIDER_SITE_OTHER): Payer: Self-pay | Admitting: *Deleted

## 2010-03-06 ENCOUNTER — Emergency Department (HOSPITAL_COMMUNITY): Payer: Medicare Other

## 2010-03-06 ENCOUNTER — Telehealth (INDEPENDENT_AMBULATORY_CARE_PROVIDER_SITE_OTHER): Payer: Self-pay | Admitting: *Deleted

## 2010-03-06 ENCOUNTER — Encounter (HOSPITAL_COMMUNITY): Payer: Self-pay

## 2010-03-06 ENCOUNTER — Encounter: Payer: Self-pay | Admitting: Internal Medicine

## 2010-03-06 ENCOUNTER — Emergency Department (HOSPITAL_COMMUNITY)
Admission: EM | Admit: 2010-03-06 | Discharge: 2010-03-06 | Disposition: A | Discharge status: Home / Self Care | Payer: Medicare Other | Attending: Emergency Medicine | Admitting: Emergency Medicine

## 2010-03-06 ENCOUNTER — Telehealth: Payer: Self-pay | Admitting: Internal Medicine

## 2010-03-06 ENCOUNTER — Ambulatory Visit (INDEPENDENT_AMBULATORY_CARE_PROVIDER_SITE_OTHER): Payer: Medicare Other | Admitting: Internal Medicine

## 2010-03-06 DIAGNOSIS — H5789 Other specified disorders of eye and adnexa: Secondary | ICD-10-CM | POA: Insufficient documentation

## 2010-03-06 DIAGNOSIS — I10 Essential (primary) hypertension: Secondary | ICD-10-CM | POA: Insufficient documentation

## 2010-03-06 DIAGNOSIS — E119 Type 2 diabetes mellitus without complications: Secondary | ICD-10-CM | POA: Insufficient documentation

## 2010-03-06 DIAGNOSIS — Z9889 Other specified postprocedural states: Secondary | ICD-10-CM | POA: Insufficient documentation

## 2010-03-06 DIAGNOSIS — Z79899 Other long term (current) drug therapy: Secondary | ICD-10-CM | POA: Insufficient documentation

## 2010-03-06 DIAGNOSIS — R42 Dizziness and giddiness: Secondary | ICD-10-CM

## 2010-03-06 DIAGNOSIS — Z8546 Personal history of malignant neoplasm of prostate: Secondary | ICD-10-CM | POA: Insufficient documentation

## 2010-03-06 DIAGNOSIS — E039 Hypothyroidism, unspecified: Secondary | ICD-10-CM | POA: Insufficient documentation

## 2010-03-06 DIAGNOSIS — H538 Other visual disturbances: Secondary | ICD-10-CM | POA: Insufficient documentation

## 2010-03-06 LAB — DIFFERENTIAL
Eosinophils Absolute: 0.2 10*3/uL (ref 0.0–0.7)
Lymphs Abs: 1.8 10*3/uL (ref 0.7–4.0)
Monocytes Absolute: 0.8 10*3/uL (ref 0.1–1.0)
Monocytes Relative: 9 % (ref 3–12)
Neutro Abs: 6.5 10*3/uL (ref 1.7–7.7)
Neutrophils Relative %: 70 % (ref 43–77)

## 2010-03-06 LAB — POCT CARDIAC MARKERS: Troponin i, poc: <0.05 ng/mL (ref 0.00–0.09)

## 2010-03-06 LAB — CBC
Hemoglobin: 13.7 g/dL (ref 13.0–17.0)
MCH: 30.6 pg (ref 26.0–34.0)
MCHC: 33.2 g/dL (ref 30.0–36.0)
MCV: 92.4 fL (ref 78.0–100.0)
RBC: 4.47 MIL/uL (ref 4.22–5.81)

## 2010-03-06 LAB — BASIC METABOLIC PANEL
CO2: 30 mEq/L (ref 19–32)
Chloride: 103 mEq/L (ref 96–112)
GFR calc Af Amer: >60 mL/min (ref >60)
Potassium: 4 mEq/L (ref 3.5–5.1)
Sodium: 139 mEq/L (ref 135–145)

## 2010-03-06 LAB — CONVERTED CEMR LAB: Hemoglobin: 13.9 g/dL

## 2010-03-06 LAB — URINALYSIS, ROUTINE W REFLEX MICROSCOPIC
Bilirubin Urine: NEGATIVE
Hgb urine dipstick: NEGATIVE
Nitrite: NEGATIVE
Protein, ur: NEGATIVE mg/dL
Urobilinogen, UA: 0.2 mg/dL (ref 0.0–1.0)

## 2010-03-07 ENCOUNTER — Other Ambulatory Visit (HOSPITAL_COMMUNITY): Payer: Medicare Other

## 2010-03-07 ENCOUNTER — Telehealth (INDEPENDENT_AMBULATORY_CARE_PROVIDER_SITE_OTHER): Payer: Self-pay | Admitting: *Deleted

## 2010-03-07 ENCOUNTER — Emergency Department (HOSPITAL_COMMUNITY): Payer: Medicare Other

## 2010-03-07 ENCOUNTER — Inpatient Hospital Stay (HOSPITAL_COMMUNITY)
Admission: EM | Admit: 2010-03-07 | Discharge: 2010-03-10 | DRG: 066 | Disposition: A | Discharge status: Home / Self Care | Payer: Medicare Other | Attending: Internal Medicine | Admitting: Internal Medicine

## 2010-03-07 DIAGNOSIS — Z8546 Personal history of malignant neoplasm of prostate: Secondary | ICD-10-CM

## 2010-03-07 DIAGNOSIS — K3189 Other diseases of stomach and duodenum: Secondary | ICD-10-CM | POA: Diagnosis present

## 2010-03-07 DIAGNOSIS — R1013 Epigastric pain: Secondary | ICD-10-CM | POA: Diagnosis present

## 2010-03-07 DIAGNOSIS — K219 Gastro-esophageal reflux disease without esophagitis: Secondary | ICD-10-CM | POA: Diagnosis present

## 2010-03-07 DIAGNOSIS — I6350 Cerebral infarction due to unspecified occlusion or stenosis of unspecified cerebral artery: Principal | ICD-10-CM | POA: Diagnosis present

## 2010-03-07 DIAGNOSIS — E039 Hypothyroidism, unspecified: Secondary | ICD-10-CM | POA: Diagnosis present

## 2010-03-07 DIAGNOSIS — H3530 Unspecified macular degeneration: Secondary | ICD-10-CM | POA: Diagnosis present

## 2010-03-07 DIAGNOSIS — R42 Dizziness and giddiness: Secondary | ICD-10-CM | POA: Diagnosis present

## 2010-03-07 DIAGNOSIS — I4891 Unspecified atrial fibrillation: Secondary | ICD-10-CM | POA: Diagnosis present

## 2010-03-07 DIAGNOSIS — E119 Type 2 diabetes mellitus without complications: Secondary | ICD-10-CM | POA: Diagnosis present

## 2010-03-07 DIAGNOSIS — R269 Unspecified abnormalities of gait and mobility: Secondary | ICD-10-CM | POA: Diagnosis present

## 2010-03-07 LAB — CK TOTAL AND CKMB (NOT AT ARMC)
Relative Index: INVALID (ref 0.0–2.5)
Total CK: 43 U/L (ref 7–232)

## 2010-03-07 LAB — DIFFERENTIAL
Basophils Absolute: 0 10*3/uL (ref 0.0–0.1)
Basophils Relative: 0 % (ref 0–1)
Eosinophils Absolute: 0.2 10*3/uL (ref 0.0–0.7)
Eosinophils Relative: 2 % (ref 0–5)
Monocytes Absolute: 0.8 10*3/uL (ref 0.1–1.0)
Monocytes Relative: 8 % (ref 3–12)

## 2010-03-07 LAB — CBC
MCH: 30.9 pg (ref 26.0–34.0)
MCHC: 32.9 g/dL (ref 30.0–36.0)
Platelets: 150 10*3/uL (ref 150–400)
RDW: 12.8 % (ref 11.5–15.5)

## 2010-03-07 LAB — BASIC METABOLIC PANEL
CO2: 29 mEq/L (ref 19–32)
Calcium: 8.9 mg/dL (ref 8.4–10.5)
Creatinine, Ser: 1.16 mg/dL (ref 0.4–1.5)
GFR calc Af Amer: >60 mL/min (ref >60)
GFR calc non Af Amer: >60 mL/min (ref >60)

## 2010-03-07 LAB — GLUCOSE, CAPILLARY: Glucose-Capillary: 128 mg/dL — ABNORMAL HIGH (ref 70–99)

## 2010-03-07 LAB — CARDIAC PANEL(CRET KIN+CKTOT+MB+TROPI)
CK, MB: 2.7 ng/mL (ref 0.3–4.0)
Relative Index: INVALID (ref 0.0–2.5)
Total CK: 39 U/L (ref 7–232)

## 2010-03-08 DIAGNOSIS — I6789 Other cerebrovascular disease: Secondary | ICD-10-CM

## 2010-03-08 LAB — PROTIME-INR: INR: 0.96 (ref 0.00–1.49)

## 2010-03-08 LAB — LIPID PANEL
Cholesterol: 196 mg/dL (ref 0–200)
HDL: 31 mg/dL — ABNORMAL LOW (ref >39)
LDL Cholesterol: 108 mg/dL — ABNORMAL HIGH (ref 0–99)

## 2010-03-08 LAB — CARDIAC PANEL(CRET KIN+CKTOT+MB+TROPI)
CK, MB: 1.7 ng/mL (ref 0.3–4.0)
Relative Index: INVALID (ref 0.0–2.5)
Total CK: 37 U/L (ref 7–232)
Troponin I: 0.01 ng/mL (ref 0.00–0.06)

## 2010-03-08 LAB — PHOSPHORUS: Phosphorus: 3.1 mg/dL (ref 2.3–4.6)

## 2010-03-08 LAB — GLUCOSE, CAPILLARY
Glucose-Capillary: 222 mg/dL — ABNORMAL HIGH (ref 70–99)
Glucose-Capillary: 167 mg/dL — ABNORMAL HIGH (ref 70–99)

## 2010-03-08 NOTE — H&P (Signed)
NAME:  [name], [name] NO.:  [id number]  MEDICAL RECORD NO.:  [id number]           PATIENT TYPE:  E  LOCATION:  WLED                         FACILITY:  [facility]  PHYSICIAN:  Hartley Barefoot, MD    DATE OF BIRTH:  [birth year]  DATE OF ADMISSION:  03/07/2010 DATE OF DISCHARGE:                             HISTORY & PHYSICAL   CHIEF COMPLAINT:  Dizziness, head spinning around.  PRIMARY CARE PHYSICIAN:  Dr. Megan Salon.  HISTORY OF PRESENT ILLNESS:  This is a 77 year old with a prior history of AFib, off of Coumadin, he was taken off of Coumadin per his primary cardiologist many years ago, dyslipidemia, diabetes, who presents to the emergency department for second time, complaining of head spinning around, dizziness, balance problems.  The patient relates that since last Wednesday, March 05, 2010, he started to have some dizziness, episode of staggering gait, he has not been able to walk very well since Wednesday due to balance problem.  He has been using a walker ever since then.  He relates that he had some ocular injection for his macular degeneration on March 05, 2010.  His symptoms started after that.  He denies any focal weakness, slurred speech, vision changes, problem with swallow.  He does relate headache in frontoparietal area, 3/10 in intensity.  He denies headache at this time.  He does not remember why he was taken off of Coumadin.  He has not followed with his cardiologist.  The patient was seen in the emergency department the day prior to admission on February 23 for these symptoms.  He was discharged home on Antivert.  The patient denies palpitation, chest pain, or shortness of breath.  REVIEW OF SYSTEMS:  Negative except as per HPI.  ALLERGIES:  He has allergy to; 1. LIPITOR, he gets hives. 2. TETANUS TOXOID. 3. CODEINE. 4. CONTRAST MEDIA. 5. ERYTHROMYCIN. 6. NITROGLYCERIN. 7. CEFOTAXIME. 8. COLCHICINE. 9. CEFUROXIME.  PAST MEDICAL  HISTORY: 1. Prior history of AFib, not on Coumadin, taken off of Coumadin by     his primary cardiologist many years ago. 2. Dyslipidemia. 3. Hypothyroidism. 4. Diabetes type 2, on insulin. 5. GERD. 6. Asthma. 7. Macular degeneration. 8. A prior history of skin cancer. 9. History of osteoarthritis of the right knee. 10.Prior history of prostatitis. 11.BPH. 12.Prior history of prostate cancer. 13.Prior history of paroxysmal ventricular tachycardia in 1998. 14.Prior history of coronary artery disease. 15.A prior history of bilateral renal calculi.  MEDICATIONS: 1. Tylenol Extra Strength 500 mg 2 tablets every 6 hours as needed. 2. Vitamin D 1000 mg p.o. daily. 3. Synthroid 50 mcg 3 to 4 tablets every evening.  He takes 4 tablets     on Wednesday and Sunday.  Takes 3 tablets on Monday, Tuesday,     Thursday, Friday, and Saturday. 4. Pantoprazole 40 mg p.o. b.i.d. 5. Beta-carotene 1 tablet twice daily. 6. Lantus 15 to 25 units daily at bedtime. 7. Lanoxin 0.25 p.o. daily. 8. Humulin 5 to 8 units t.i.d. 9. Furosemide 20 mg p.o. daily. 10.Welchol 625 mg 2 tablets 3 times a day with meals. 11.Diltiazem 240 mg p.o. daily.  12.Aspirin 81 mg p.o. daily.  SOCIAL HISTORY:  Denies smoking alcohol or recreational drugs.  He is married.  He is retired Programmer, [employer].  He has 2 grown children.  FAMILY HISTORY:  Noncontributory.  PHYSICAL EXAMINATION:  VITALS:  Temperature 97.7, blood pressure 155/77, pulse 65, respirations 18, sat 97% on room air. GENERAL:  The patient lying in bed in no acute distress. HEENT: Head atraumatic normocephalic.  Eyes pupil equal and reactive to light.  Extraocular muscles intact. NECK:  Supple.  No thyromegaly.  No JVD.  No carotid briuits. CARDIOVASCULAR:  S1 and S2.  Regular rhythm and rate.  No murmurs, rubs, or gallops. LUNGS:  Bilateral good air movement.  No wheezing, crackles, or rhonchi. ABDOMEN:  Bowel sounds positive.  Soft,  nontender, nondistended.  No rigidity.  No guarding. EXTREMITIES:  Pulse positive.  No edema. NEUROLOGIC:  Alert and oriented x3.  Cranial nerves II through XII intact.  Sensation grossly intact.  Motor strength 5/5 throughout.  Gait not tested.  Finger-to-nose negative.  LABORATORY DATA:  Admission labs:  Sodium 137, potassium 4.0, chloride 103, bicarb 29, BUN 16, creatinine 1.16, glucose 161, calcium 8.9. White blood cell 9.9, hemoglobin 14.2, hematocrit 43, platelets 150. Total CK 43, troponin 0.01.  MRI, atrophy and chronic small vessel disease, questionable  acute infarct within the right middle cerebral peduncle and within the right pontomedullary junction.  ASSESSMENT AND PLAN:  This is a 77 year old with multiple medical conditions, hypertension, hyperlipidemia, diabetes who presents with dizziness and balance problems. 1. Acute stroke.  Stroke happens some times , on February     22.  MRI showed possible cerebellar stroke.  I will admit to     telemetry.  I will check fasting lipid panel, hemoglobin A1c, and,     carotid Dopplers, 2-D echo, cardiac enzymes, EKG.  I will get PT/OT     consult, and speech therapy consult.  Swallow evaluation.  We will     need telemetry to monitor for arrhythmia.  I will continue with     aspirin 325 p.o. daily.  If the patient does not have any     arrhythmia or atrial fibrillation on telemetry, we can consider     Plavix. 2. Hypertension.  Continue with Cardizem and Lasix. 3. Hyperlipidemia.  Continue with WelChol.  I will check fasting lipid     panel. 4. Hypothyroidism.  Continue with Synthroid. 5. Diabetes.  Check hemoglobin A1c, continue with Lantus and sliding     scale insulin. 6. Prior history of atrial fibrillation.  EKG done today shows sinus     rhythm.  I will monitor on telemetry.  I will restart aspirin 325     mg p.o. daily.  If the patient has any evidence of atrial     fibrillation, will need to discuss Coumadin. 7. Deep  venous thrombosis prophylaxis, Lovenox.     Hartley Barefoot, MD     BR/MEDQ  D:  03/07/2010  T:  03/07/2010  Job:  [id number]  Electronically Signed by Hartley Barefoot MD on 03/08/2010 12:03:36 AM

## 2010-03-09 LAB — GLUCOSE, CAPILLARY
Glucose-Capillary: 120 mg/dL — ABNORMAL HIGH (ref 70–99)
Glucose-Capillary: 149 mg/dL — ABNORMAL HIGH (ref 70–99)

## 2010-03-10 DIAGNOSIS — I6350 Cerebral infarction due to unspecified occlusion or stenosis of unspecified cerebral artery: Secondary | ICD-10-CM

## 2010-03-10 LAB — BASIC METABOLIC PANEL
CO2: 30 mEq/L (ref 19–32)
Calcium: 8.5 mg/dL (ref 8.4–10.5)
Chloride: 101 mEq/L (ref 96–112)
GFR calc Af Amer: >60 mL/min (ref >60)
Glucose, Bld: 195 mg/dL — ABNORMAL HIGH (ref 70–99)
Sodium: 137 mEq/L (ref 135–145)

## 2010-03-10 LAB — GLUCOSE, CAPILLARY

## 2010-03-11 NOTE — Progress Notes (Signed)
Summary: Dizziness  Phone Note Call from Patient Call back at Home Phone [phone]   Caller: Patient Summary of Call: Message left on voicemail: patient was very dizzy x 2 on Wed and unable to walk, 911 called, patient spent the night in the Hospital on Wed. Patient was given med for inner ear, patient woke up this morning and still dizzy. Patient was told he is unable to be seen until Tuesday.  Please call.    Follow-up for Phone Call        Left message on machine for patient to return call when avaliable, Reason for call:  offer patient a sooner appointment  Follow-up by: Shonna Chock CMA,  March 06, 2010 11:58 AM  Additional Follow-up for Phone Call Additional follow up Details #1::        Spoke with patient spouse and she will bring him today, paz has openings. Additional Follow-up by: Lucious Groves CMA,  March 06, 2010 2:34 PM

## 2010-03-11 NOTE — Assessment & Plan Note (Signed)
Summary: Dizziness and off balance, difficulty hearing,wants ears chec...   Vital Signs:  Patient profile:   77 year old male Weight:      171 pounds Pulse rate:   84 / minute Pulse rhythm:   regular BP sitting:   152 / 82  (left arm) Cuff size:   regular  Vitals Entered By: Army Fossa CMA (March 06, 2010 3:23 PM) CC: Went to hospital last night has been dizzy- feels like he cannot hear. Comments hospital gave him melcinzie last night  [facility] [address]    History of Present Illness:  the patient developed dizziness  yesterday after he quickly got off the sofa  and developed  a spinning  and  "drunk" feeling   the symptoms lasted few minutes , his CBG was 70 , his usual blood sugar is around 200.  later , he had the same symptoms , this time they were more prolonged and triggered by trying to stand up or moving his head.  that prompted a 911 call  and the  ER evaluation.  at no time he had any nausea, diplopia, motor deficits, slurred speech.    review of systems  no headaches,  he has decreased hearing which is nothing new.   no recent ear ache or ear discharge  no chest pain, shortness or breath, palpitations   no recent ambulatory blood pressures  no taking any new meds    he had a stress test December 2011 that was negative  since he left the hospital earlier today, he is feeling better     ER   chart is reviewed  CT of the head last night  nonacute  potassium 4.0, creatinine 1.0, glucose 1 34  cardiac enzymes negative   white count 9.4, hemoglobin 13.7, platelets 179.    no EKG found   Current Medications (verified): 1)  Furosemide 20 Mg Tabs (Furosemide) Dye Free .... Take 1 Tab Once Daily**dye Free** 2)  Synthroid 50 Mcg  Tabs (Levothyroxine Sodium) .... 3 Tabs Daily Except  4 Pills Weds & Sun 3)  Lanoxin 0.25 Mg  Tabs (Digoxin) .Marland Kitchen.. 1 By Mouth Once Daily 4)  Dilt-Cd 240 Mg Xr24h-Cap (Diltiazem Hcl Coated Beads) .... Take One Daily 5)  Welchol 625  Mg  Tabs (Colesevelam Hcl) .... 6 Tabs Once Daily Patient 6)  Aspirin 81 Mg  Tabs (Aspirin) .... Take 1 Tablet By Mouth Once A Day 7)  Humulin R 100 Unit/ml Inj Soln (Insulin Regular Human) .... Sliding Scale 8)  Lantus 100 Unit/ml  Soln (Insulin Glargine) .... Uses 15-20 Units At Bedtime But Can Go As High As 35units Qhs 9)  Must Have Meds Listed Do Not Substitute Generics Patient All .... Patient Allergic To Dyes 10)  Vitamin D 1000 Unit  Caps (Cholecalciferol) .... Take 1 Capsule By Mouth Once A Day 11)  Zolpidem Tartrate 5 Mg  Tabs (Zolpidem Tartrate) .Marland Kitchen.. 1 At Bedtime Occasionally For Sleep 12)  Bd Insulin Syringe U-100 1 Ml  Misc (Insulin Syringes (Disposable)) .... Uses Three Times Daily 13)  Proair Hfa 108 (90 Base) Mcg/act  Aers (Albuterol Sulfate) .... Inhale 2 Puffs Four Times A Day As Needed For Shortness of Breath 14)  Prilosec Otc 20 Mg Tbec (Omeprazole Magnesium) .Marland Kitchen.. 1 By Mouth Once Daily 15)  Diabetic Shoes -250.00 .Marland Kitchen.. 1 Pair-  250.00 16)  Bayer Contour Test  Strp (Glucose Blood) .... Check Bloodsugar 4 X Daily 17)  Lancets  Misc (Lancets) .... Check  Bloodsugar 4 X Daily  Dx: 250.00 18)  Benadryl 25 Mg Tabs (Diphenhydramine Hcl) .... Take 1 By Mouth Once Daily As Needed 19)  Hydroxyzine Pamoate 25 Mg Caps (Hydroxyzine Pamoate) .Marland Kitchen.. 1 Every 6 Hrs As Needed For Itching , Hives 20)  Ranitidine Hcl 150 Mg Tabs (Ranitidine Hcl) .Marland Kitchen.. 1 Two Times A Day Pre Meals in Place of Pantooprazole or Prilosec ; This Will Treat Hives 21)  Antivert 25 Mg Tabs (Meclizine Hcl) .Marland Kitchen.. 1 By Mouth Every 6 Hrs As Needed.  Allergies (verified): 1)  ! Codeine 2)  ! * Tetanus 3)  ! Zocor 4)  ! Lipitor 5)  ! Zetia 6)  ! Crestor 7)  ! Percocet 8)  ! * Nitro 9)  ! * Dyes 10)  * Iv Dye  Past History:  Past Medical History: Reviewed history from 11/25/2009 and no changes required. Skin cancer, PMH  of Hypothyroidism,  Prostate cancer, PMH of 2007, Dr Vonita Moss asthmatic bronchitis allergic  rhinitis Urticara,PMH of, Dr Reather Laurence dyslipidemia ; intolerance to multiple statins & Zetia Diabetes mellitus, type II Macular Degeneration , Dr Luciana Axe  Past Surgical History: Reviewed history from 11/25/2009 and no changes required. PAF, PMH of 2005-POPLITEAL CYSTECTOMY 2006-GALLSTONES( no cholecystectomy) 2007-PROSTATE CANCER; S/P  RADIATION TREATMENT 2008-TKR on   R, 2010 on L PARTIAL THYROIDECTOMY 1985 FOR NODULES Left partial lobectomy for bronchiectasis age 67  Social History: Reviewed history from 08/23/2009 and no changes required. retired married with 2 children  Physical Exam  General:  alert and well-developed.   Ears:  R ear normal and L ear normal.   Neck:  no masses and normal carotid upstroke.   Lungs:  Normal respiratory effort, chest expands symmetrically. Lungs are clear to auscultation, no crackles or wheezes. Heart:  Normal rate and regular rhythm. S1 and S2 normal without gallop, murmur, click, rub . S4 with slurring Extremities:   no lower extremity edema Neurologic:  alert & oriented X3.   ocular movements intact ,  heart to assess pupils d/t  previous eye problems  speech fluent, motor exam symmetric,  upper extremity DTRs normal  Good memory and recollection. [place] absent Psych:  not anxious appearing and not depressed appearing.     Impression & Recommendations:  Problem # 1:  DIZZINESS (ICD-780.4)  dizziness   with peripheral  features develped  in the last 24 hours , doubt  symptoms represents an arrhythmia or neurological event although that is in the differential  workup in the ER was negative  neurological exam is benign, his hemoglobin today is normal.  EKG today is at baseline   plan: continue antivert see instructions  His updated medication list for this problem includes:    Benadryl 25 Mg Tabs (Diphenhydramine hcl) .Marland Kitchen... Take 1 by mouth once daily as needed    Antivert 25 Mg Tabs (Meclizine hcl) .Marland Kitchen... 1 by mouth every 6 hrs as  needed.  Orders: EKG w/ Interpretation (93000)  Complete Medication List: 1)  Furosemide 20 Mg Tabs (furosemide) Dye Free  .... Take 1 tab once daily**dye free** 2)  Synthroid 50 Mcg Tabs (Levothyroxine sodium) .... 3 tabs daily except  4 pills weds & sun 3)  Lanoxin 0.25 Mg Tabs (Digoxin) .Marland Kitchen.. 1 by mouth once daily 4)  Dilt-cd 240 Mg Xr24h-cap (Diltiazem hcl coated beads) .... Take one daily 5)  Welchol 625 Mg Tabs (Colesevelam hcl) .... 6 tabs once daily patient 6)  Aspirin 81 Mg Tabs (Aspirin) .... Take 1 tablet by mouth once  a day 7)  Humulin R 100 Unit/ml Inj Soln (Insulin regular human) .... Sliding scale 8)  Lantus 100 Unit/ml Soln (Insulin glargine) .... Uses 15-20 units at bedtime but can go as high as 35units qhs 9)  Must Have Meds Listed Do Not Substitute Generics Patient All  .... Patient allergic to dyes 10)  Vitamin D 1000 Unit Caps (Cholecalciferol) .... Take 1 capsule by mouth once a day 11)  Zolpidem Tartrate 5 Mg Tabs (Zolpidem tartrate) .Marland Kitchen.. 1 at bedtime occasionally for sleep 12)  Bd Insulin Syringe U-100 1 Ml Misc (Insulin syringes (disposable)) .... Uses three times daily 13)  Proair Hfa 108 (90 Base) Mcg/act Aers (Albuterol sulfate) .... Inhale 2 puffs four times a day as needed for shortness of breath 14)  Prilosec Otc 20 Mg Tbec (Omeprazole magnesium) .Marland Kitchen.. 1 by mouth once daily 15)  Diabetic Shoes -250.00  .Marland Kitchen.. 1 pair-  250.00 16)  Bayer Contour Test Strp (Glucose blood) .... Check bloodsugar 4 x daily 17)  Lancets Misc (Lancets) .... Check bloodsugar 4 x daily  dx: 250.00 18)  Benadryl 25 Mg Tabs (Diphenhydramine hcl) .... Take 1 by mouth once daily as needed 19)  Hydroxyzine Pamoate 25 Mg Caps (Hydroxyzine pamoate) .Marland Kitchen.. 1 every 6 hrs as needed for itching , hives 20)  Ranitidine Hcl 150 Mg Tabs (Ranitidine hcl) .Marland Kitchen.. 1 two times a day pre meals in place of pantooprazole or prilosec ; this will treat hives 21)  Antivert 25 Mg Tabs (Meclizine hcl) .Marland Kitchen.. 1 by mouth  every 6 hrs as needed.  Patient Instructions: 1)   take your medicines as usual , take also Antivert as needed for dizziness. 2)   drink plenty of fluids 3)   stand up slowly 4)   check your blood pressure twice a day, call if your blood pressure is more than  160/90 or less than 110/70. 5)   also call if the  blood sugars are less than 90  6)   call anytime if the dizziness is coming back frequently or is severe.  7)  Please see your primary doctor, Dr. Alfonse Flavors in 2 weeks   Orders Added: 1)  EKG w/ Interpretation [93000] 2)  Est. Patient Level IV [99214]    Laboratory Results   CBC   HGB:  13.9 g/dL   (Normal Range: 45.4-09.8 in Males, 12.0-15.0 in Females) CommentsArmy Fossa CMA  March 06, 2010 4:47 PM

## 2010-03-11 NOTE — Progress Notes (Signed)
Summary: ALT MED  Phone Note Refill Request Message from:  Fax from Pharmacy on March 06, 2010 3:34 PM  [facility] -- note ALL DILTOAZEM CD HAVE DYES ADDED. MATZIM LA 24 HR TABLETS (GENERIC FOR CARDIZEM LA 240) IS DYE FREE. PLEASE SIGN BELOW TO AUTHORIZE MATZIM LA 240 (DAW) 1 DAILY # 90 1 REFILL.   Initial call taken by: Okey Regal Spring,  March 06, 2010 3:41 PM  Follow-up for Phone Call        Dr.Hopper pleaes advise Follow-up by: Shonna Chock CMA,  March 06, 2010 3:52 PM  Additional Follow-up for Phone Call Additional follow up Details #1::        He has had severe Urticaria for which he was seen @ [facility]). Please use dye free Ditiazem . He is on dye free Synthroid for the same reason Additional Follow-up by: Marga Melnick MD,  March 06, 2010 5:08 PM    Additional Follow-up for Phone Call Additional follow up Details #2::    I faxed back form yesterday with the change indicated for the generic./Chrae [facility] CMA  March 07, 2010 10:08 AM   New/Updated Medications: MATZIM LA 240 MG XR24H-TAB (DILTIAZEM HCL COATED BEADS) DAW- 1 by mouth once daily

## 2010-03-11 NOTE — Progress Notes (Signed)
Summary: refill  Phone Note Refill Request Message from:  Fax from Pharmacy on March 03, 2010 2:46 PM  Refills Requested: Medication #1:  [facility] - fax [phone]  Initial call taken by: Okey Regal Spring,  March 03, 2010 2:48 PM  Follow-up for Phone Call        RX faxed to: [phone]  Follow-up by: Shonna Chock CMA,  March 03, 2010 3:21 PM    Prescriptions: DILT-CD 240 MG XR24H-CAP (DILTIAZEM HCL COATED BEADS) take one daily  #90 x 1   Entered by:   Shonna Chock CMA   Authorized by:   Marga Melnick MD   Signed by:   Shonna Chock CMA on 03/03/2010   Method used:   Print then Give to Patient   RxID:   [id number]

## 2010-03-11 NOTE — Progress Notes (Signed)
Summary: Dizziness Worse  Phone Note Call from Patient Call back at Home Phone [phone]   Caller: Spouse Summary of Call: Patient's wife called and indicated patient took prescribed med for dizziness x 4 and it is not working. Patient's dizziness is worse and patient's wife is so afraid patient is going to fall due to dizziness.   Per Dr.Paz's patient instructions, patient to call if dizziness worsens, Dr.Paz please advise./Chrae [facility] CMA  March 07, 2010 9:49 AM   Follow-up for Phone Call        advise patient ER : -- further w/u? MRI? -- if he is that dizzy , he is high risk of flling, may need to be admited --please make ER doctor call me when patient is there Jose E. Paz MD  March 07, 2010 10:06 AM   Additional Follow-up for Phone Call Additional follow up Details #1::        I spoke w/ pts wife she is aware. They are going to try to go to [facility]. Will have ER Doctor call when patient gets there. Army Fossa CMA  March 07, 2010 10:12 AM

## 2010-03-13 ENCOUNTER — Other Ambulatory Visit (HOSPITAL_COMMUNITY): Payer: Medicare Other

## 2010-03-13 ENCOUNTER — Emergency Department (HOSPITAL_COMMUNITY): Payer: Medicare Other

## 2010-03-13 ENCOUNTER — Inpatient Hospital Stay (HOSPITAL_COMMUNITY)
Admission: EM | Admit: 2010-03-13 | Discharge: 2010-03-15 | DRG: 093 | Disposition: A | Discharge status: Home Health | Payer: Medicare Other | Attending: Internal Medicine | Admitting: Internal Medicine

## 2010-03-13 DIAGNOSIS — J45909 Unspecified asthma, uncomplicated: Secondary | ICD-10-CM | POA: Diagnosis present

## 2010-03-13 DIAGNOSIS — E039 Hypothyroidism, unspecified: Secondary | ICD-10-CM | POA: Diagnosis present

## 2010-03-13 DIAGNOSIS — I10 Essential (primary) hypertension: Secondary | ICD-10-CM | POA: Diagnosis present

## 2010-03-13 DIAGNOSIS — Z887 Allergy status to serum and vaccine status: Secondary | ICD-10-CM

## 2010-03-13 DIAGNOSIS — E785 Hyperlipidemia, unspecified: Secondary | ICD-10-CM | POA: Diagnosis present

## 2010-03-13 DIAGNOSIS — R269 Unspecified abnormalities of gait and mobility: Principal | ICD-10-CM | POA: Diagnosis present

## 2010-03-13 DIAGNOSIS — Z8673 Personal history of transient ischemic attack (TIA), and cerebral infarction without residual deficits: Secondary | ICD-10-CM

## 2010-03-13 DIAGNOSIS — N400 Enlarged prostate without lower urinary tract symptoms: Secondary | ICD-10-CM | POA: Diagnosis present

## 2010-03-13 DIAGNOSIS — Z888 Allergy status to other drugs, medicaments and biological substances status: Secondary | ICD-10-CM

## 2010-03-13 DIAGNOSIS — Z96659 Presence of unspecified artificial knee joint: Secondary | ICD-10-CM

## 2010-03-13 DIAGNOSIS — E119 Type 2 diabetes mellitus without complications: Secondary | ICD-10-CM | POA: Diagnosis present

## 2010-03-13 DIAGNOSIS — Z7902 Long term (current) use of antithrombotics/antiplatelets: Secondary | ICD-10-CM

## 2010-03-13 DIAGNOSIS — Z794 Long term (current) use of insulin: Secondary | ICD-10-CM

## 2010-03-13 DIAGNOSIS — M1990 Unspecified osteoarthritis, unspecified site: Secondary | ICD-10-CM | POA: Diagnosis present

## 2010-03-13 DIAGNOSIS — Z8546 Personal history of malignant neoplasm of prostate: Secondary | ICD-10-CM

## 2010-03-13 DIAGNOSIS — H3530 Unspecified macular degeneration: Secondary | ICD-10-CM | POA: Diagnosis present

## 2010-03-13 DIAGNOSIS — I2510 Atherosclerotic heart disease of native coronary artery without angina pectoris: Secondary | ICD-10-CM | POA: Diagnosis present

## 2010-03-13 DIAGNOSIS — I4891 Unspecified atrial fibrillation: Secondary | ICD-10-CM | POA: Diagnosis present

## 2010-03-13 DIAGNOSIS — Z91041 Radiographic dye allergy status: Secondary | ICD-10-CM

## 2010-03-13 LAB — DIFFERENTIAL
Eosinophils Absolute: 0.2 10*3/uL (ref 0.0–0.7)
Eosinophils Relative: 4 % (ref 0–5)
Lymphocytes Relative: 29 % (ref 12–46)
Lymphs Abs: 1.8 10*3/uL (ref 0.7–4.0)
Monocytes Absolute: 0.6 10*3/uL (ref 0.1–1.0)
Monocytes Relative: 11 % (ref 3–12)

## 2010-03-13 LAB — BASIC METABOLIC PANEL
BUN: 13 mg/dL (ref 6–23)
CO2: 30 mEq/L (ref 19–32)
Calcium: 9.1 mg/dL (ref 8.4–10.5)
Creatinine, Ser: 1.02 mg/dL (ref 0.4–1.5)
GFR calc non Af Amer: >60 mL/min (ref >60)
Glucose, Bld: 102 mg/dL — ABNORMAL HIGH (ref 70–99)
Sodium: 141 mEq/L (ref 135–145)

## 2010-03-13 LAB — CARDIAC PANEL(CRET KIN+CKTOT+MB+TROPI)
CK, MB: 1.7 ng/mL (ref 0.3–4.0)
Relative Index: INVALID (ref 0.0–2.5)
Relative Index: INVALID (ref 0.0–2.5)
Total CK: 44 U/L (ref 7–232)
Total CK: 41 U/L (ref 7–232)

## 2010-03-13 LAB — POCT CARDIAC MARKERS: CKMB, poc: 1.4 ng/mL (ref 1.0–8.0)

## 2010-03-13 LAB — CK TOTAL AND CKMB (NOT AT ARMC)
CK, MB: 1.8 ng/mL (ref 0.3–4.0)
Relative Index: INVALID (ref 0.0–2.5)
Total CK: 43 U/L (ref 7–232)

## 2010-03-13 LAB — LIPID PANEL
Cholesterol: 215 mg/dL — ABNORMAL HIGH (ref 0–200)
Total CHOL/HDL Ratio: 6.3 RATIO

## 2010-03-13 LAB — GLUCOSE, CAPILLARY

## 2010-03-13 LAB — APTT: aPTT: 24 seconds (ref 24–37)

## 2010-03-13 LAB — CBC
HCT: 42 % (ref 39.0–52.0)
MCH: 31.6 pg (ref 26.0–34.0)
MCHC: 34 g/dL (ref 30.0–36.0)
MCV: 92.7 fL (ref 78.0–100.0)
Platelets: 186 10*3/uL (ref 150–400)
RDW: 12.7 % (ref 11.5–15.5)

## 2010-03-13 LAB — TSH: TSH: 3.51 u[IU]/mL (ref 0.350–4.500)

## 2010-03-13 NOTE — H&P (Signed)
NAME:  [name], [name] NO.:  [id number]  MEDICAL RECORD NO.:  [id number]           PATIENT TYPE:  E  LOCATION:  [facility]                         FACILITY:  [facility]  PHYSICIAN:  Conley Canal, MD      DATE OF BIRTH:  1931/08/22  DATE OF ADMISSION:  03/13/2010 DATE OF DISCHARGE:                             HISTORY & PHYSICAL   PRIMARY CARE PHYSICIAN:  William Dubin. Alwyn Ren, MD, FACP, FCCP  CHIEF COMPLAINT:  Unsteady gait and dizziness.  HISTORY OF PRESENT ILLNESS:  [name] is a 77 year old male with multiple comorbidities including hyperlipidemia, history of paroxysmal atrial fibrillation, off Coumadin for several years, diabetes mellitus type 2, multiple drug allergies, hypothyroidism, hypertension, macular degeneration, benign prostatic hypertrophy, who comes in with complaint of dizziness and unsteady gait.  The patient was actually in the hospital one week ago.  At that point, the patient had presented with complaints of dizziness and balance problems.  He was found to have tiny cerebellar infarcts.  His workup at that point included 2-D echocardiogram, carotid duplex, and MRI of the brain, which were unrevealing for cause of the infarcts, possibly thought to be diabetes related and also possibly hypertension.  He says that he felt better after going home, but last night he went out to eat and he came back. He said when he tried to lie down in bed, he felt spinning sensation. He did not have any headaches.  No other symptomatology.  In the emergency room today, he had a CT of the brain without contrast, which did not show any acute intracranial abnormality.  His blood pressure has been in the 150s and 160s systolic, otherwise labs including digoxin level, cardiac enzymes, alcohol level, coagulation profile, electrolytes so far unrevealing.  PAST MEDICAL HISTORY: 1. Diabetes mellitus type 2. 2. Hyperlipidemia. 3. Hypothyroidism. 4. Hypertension. 5. Paroxysmal  atrial fibrillation, not anticoagulated on aspirin. 6. Multiple drug allergies. 7. Macular degeneration. 8. Osteoarthritis.  HOME MEDICATIONS:  Include aspirin, Prilosec, Cardizem, Lasix, Humulin R, digoxin, Lantus, Ocuvite, Synthroid, Tylenol Extra Strength, vitamin D, WelChol.  ALLERGIES:  INCLUDE TETANUS TOXOID, CODEINE, CONTRAST MEDIA, ERYTHROMYCIN, NITROGLYCERIN, FELANTIN, COLCHICINE, CEFTIN, ZOCOR, LIPITOR, ZETIA, CRESTOR, AND PERCOCET.  REVIEW OF SYSTEMS:  Unremarkable except as highlighted in the history of present illness.  SOCIAL HISTORY:  The patient is married.  Denies cigarette smoking, alcohol, or illicit drugs.  FAMILY HISTORY:  His father died in his 23s of MI.  Mother died at age 14.  PHYSICAL EXAMINATION:  GENERAL:  This is a pleasant elderly gentleman, who is not in acute distress. VITALS:  Blood pressure 145/88, heart rate is 63, temperature 97.4, respirations 16, oxygen saturation was 96% on room air. HEENT:  Head, ears, nose and throat examination, pupils equal and reacting to light.  No nystagmus or jugular venous distention.  No carotid bruits. RESPIRATORY SYSTEM:  Good air entry bilaterally with no rhonchi, rales, or wheezes. CARDIOVASCULAR SYSTEM:  First and second heart sounds heard.  No murmurs.  Pulse regular. ABDOMEN:  Scaphoid, soft, and nontender.  No palpable organomegaly. Bowel sounds are normal. CNS:  The patient is alert,  oriented to person, place, and time.  He has no cerebellar signs on my exam.  He has normal strength and reflexes in all extremities. EXTREMITIES:  No pedal edema.  Peripheral pulses equal.  LABORATORY DATA:  Labs reviewed, significant for normal CBC, coagulation profile, electrolytes, and digoxin level.  EKG shows normal sinus rhythm with first-degree AV block, no significant ST-T wave changes.  CT of brain without contrast showed no acute intracranial abnormality.  2-D echocardiogram on March 08, 2010, showed EF  65%, normal wall motion, grade 1 diastolic dysfunction and that also showed an atrial septal aneurysm.  Carotid duplex on March 10, 2010, showed no significant extracranial carotid artery stenosis.  IMPRESSION:  This is a 77 year old male with many risk factors for coronary artery disease, who recently had tiny cerebellar infarcts, who is coming with ataxia.  The differential diagnosis at this point could be, this might be just residual effects of the cerebellar infarcts that the patient recently had.  The other possibility could be that he might still be throwing lead to emboli into the cerebellum causing the recurrence of symptoms.  The other possibility would also be other causes including vitamin B12 deficiency.  The question is whether he needs more neurological workup and whether he should be in a short-term rehab facility until the symptoms have subsided.  PLAN: 1. Ataxia.  We will admit the patient to telemetry.  Obtain serial     cardiac enzymes, aggressive risk factor modification to control     hyperlipidemia and hypertension.  Question of whether the patient     would benefit from TEE and whether if we think the cause is emboli     whether he should be back on Coumadin.  We will ask Neurology to     help with decision on further workup.  We will ask clinical social     work and physical therapy to evaluate need for short-term rehab. 2. Hypertension.  Blood pressure, uncontrolled.  Resume Cardizem and     Lasix.  Might be a good candidate for angiotensin-converting enzyme     inhibitors given his diabetes mellitus if there is room. 3. Hypothyroidism.  Plan to resume Synthroid. 4. Diabetes mellitus type 2.  Hemoglobin A1c was 6.9 in the last     admission.  We will resume Lantus and sliding     scale insulin. 5. DVT prophylaxis, Lovenox. 6. GI prophylaxis, PPI.     Conley Canal, MD     SR/MEDQ  D:  03/13/2010  T:  03/13/2010  Job:  [id number]  cc:   William Dubin.  Alwyn Ren, MD,FACP,FCCP  Electronically Signed by Conley Canal  on 03/13/2010 08:29:12 PM

## 2010-03-14 ENCOUNTER — Encounter: Payer: Self-pay | Admitting: Internal Medicine

## 2010-03-14 ENCOUNTER — Inpatient Hospital Stay (HOSPITAL_COMMUNITY): Payer: Medicare Other

## 2010-03-14 ENCOUNTER — Ambulatory Visit: Payer: Medicare Other | Admitting: Internal Medicine

## 2010-03-14 LAB — GLUCOSE, CAPILLARY
Glucose-Capillary: 146 mg/dL — ABNORMAL HIGH (ref 70–99)
Glucose-Capillary: 244 mg/dL — ABNORMAL HIGH (ref 70–99)
Glucose-Capillary: 83 mg/dL (ref 70–99)
Glucose-Capillary: 242 mg/dL — ABNORMAL HIGH (ref 70–99)

## 2010-03-14 LAB — HEMOGLOBIN A1C: Mean Plasma Glucose: 146 mg/dL — ABNORMAL HIGH (ref <117)

## 2010-03-15 ENCOUNTER — Inpatient Hospital Stay (HOSPITAL_COMMUNITY): Payer: Medicare Other

## 2010-03-15 LAB — BASIC METABOLIC PANEL
BUN: 15 mg/dL (ref 6–23)
Chloride: 102 mEq/L (ref 96–112)
GFR calc non Af Amer: >60 mL/min (ref >60)
Glucose, Bld: 70 mg/dL (ref 70–99)
Potassium: 3.7 mEq/L (ref 3.5–5.1)
Sodium: 142 mEq/L (ref 135–145)

## 2010-03-15 LAB — GLUCOSE, CAPILLARY: Glucose-Capillary: 83 mg/dL (ref 70–99)

## 2010-03-15 LAB — CBC
HCT: 40.1 % (ref 39.0–52.0)
Hemoglobin: 13.4 g/dL (ref 13.0–17.0)
MCV: 91.8 fL (ref 78.0–100.0)
RBC: 4.37 MIL/uL (ref 4.22–5.81)
RDW: 12.7 % (ref 11.5–15.5)
WBC: 5.3 10*3/uL (ref 4.0–10.5)

## 2010-03-17 ENCOUNTER — Encounter: Payer: Self-pay | Admitting: Internal Medicine

## 2010-03-17 ENCOUNTER — Ambulatory Visit (INDEPENDENT_AMBULATORY_CARE_PROVIDER_SITE_OTHER): Payer: Medicare Other | Admitting: Internal Medicine

## 2010-03-17 DIAGNOSIS — R279 Unspecified lack of coordination: Secondary | ICD-10-CM | POA: Insufficient documentation

## 2010-03-17 DIAGNOSIS — R42 Dizziness and giddiness: Secondary | ICD-10-CM

## 2010-03-20 NOTE — Discharge Summary (Signed)
NAME:  [name], [name] NO.:  [id number]  MEDICAL RECORD NO.:  [id number]           PATIENT TYPE:  I  LOCATION:  1413                         FACILITY:  [facility]  PHYSICIAN:  Altha Harm, MDDATE OF BIRTH:  1931-01-27  DATE OF ADMISSION:  03/07/2010 DATE OF DISCHARGE:  03/10/2010                              DISCHARGE SUMMARY   DISCHARGE DISPOSITION:  Home.  FINAL DISCHARGE DIAGNOSES: 1. Small cerebellar infarcts. 2. Dizziness, resolved. 3. Gait abnormality, resolved. 4. Hypothyroidism. 5. Dyspepsia. 6. Hyperlipidemia. 7. Diabetes type 2. 8. Asthma. 9. Macular degeneration. 10.History of skin cancer. 11.Osteoarthritis. 12.History of prostatitis. 13.Benign prostatic hypertrophy. 14.History of prostate cancer. 15.History of paroxysmal ventricular tachycardia. 16.History of coronary artery disease. 17.History of bilateral renal calculi.  DISCHARGE MEDICATIONS: 1. Aspirin 325 mg p.o. daily. 2. Prilosec 40 mg p.o. daily. 3. Diltiazem CD 240 mg p.o. daily. 4. Furosemide 20 mg p.o. daily. 5. Humulin R 5-8 units subcutaneously t.i.d. around meals. 6. Lanoxin 0.25 mg p.o. daily. 7. Lantus 15-25 units subcutaneously at bedtime on a sliding scale. 8. Ocuvite 1 tablet p.o. b.i.d. 9. Synthroid 200 mg on Wednesday and Sunday and 150 mg on Monday,     Tuesday, Thursday, Friday, and Saturday. 10.Tylenol Extra Strength 1000 mg p.o. q.6h. p.r.n. pain. 11.Vitamin D 1000 units p.o. daily. 12.Welchol 625 mg 2 tablets p.o. t.i.d. with meals.  MEDICATIONS DISCONTINUED: 1. Aspirin 81 mg p.o. daily. 2. Protonix 40 mg p.o. b.i.d.  CONSULTANTS:  None.  PROCEDURES:  None.  DIAGNOSTIC STUDIES: 1. Bilateral carotid duplex which shows no evidence of ICA stenosis     bilaterally, and the vertebral artery flow is antegrade     bilaterally. 2. A 2-D echocardiogram done on March 07, 2010, which shows left     ventricular cavity size normal with preserved ejection  fraction in     the range of 60-65%.  There was a pattern of mild left ventricular     hypertrophy and parameters consistent with grade 1 diastolic     dysfunction.  The mitral valve shows calcified annulus and there is     atrial septal aneurysm. 3. CT of the head without contrast done on admission which shows mild     generalized atrophy and scattered subcortical white matter     hypoattenuation bilaterally.  This is nonspecific but likely     reflects sequelae of chronic microvascular ischemia.  There is no     acute intracranial abnormality identified. 4. MRI of the brain without contrast which shows a background pattern     of age related atrophy and chronic small vessel disease.  There is     a question of punctated acute infarction within the right middle     cerebellar peduncle and within the right pontomedullary junction.     These are such tiny abnormalities that they are difficult to     establish with certainty but could possibly exist.  PRIMARY CARE PHYSICIAN:  Titus Dubin. Alwyn Ren, M.D.  CODE STATUS:  Full code.  ALLERGIES:  Multiple including: 1. STATINS. 2. TETANUS TOXOID. 3. CODEINE. 4. CONTRAST MEDIA. 5. AZITHROMYCIN. 6.  NITROGLYCERIN. 7. CEFPODOXIME. 8. COLCHICINE. 9. CEFUROXIME. 10.OXYCODONE.  CHIEF COMPLAINT:  Dizziness and vertigo.  HISTORY OF PRESENT ILLNESS:  Please refer to the H and P by Dr. Sunnie Nielsen for details of the HPI.  However, this is a 77 year old gentleman with a history of paroxysmal atrial fibrillation who has previously been on Coumadin and was removed many years ago.  The patient presented to the emergency for the second time complaining of his head spinning around with dizziness and loss of balance.  The patient was admitted for further evaluation and management.  HOSPITAL COURSE: 1. Dizziness and disequilibrium.  The patient was evaluated for the     possibility of a stroke as a cause of this.  The patient had some     questionable  findings of small punctate cerebellar infarct.  I am     unsure that this punctate area could reasonably account for the     patient's disequilibrium and dizziness.  However during his     hospital course and without any significant intervention, the     patient actually resolved this dizziness.  At this time, he is able     to ambulate without any difficulty and is back to his baseline.     The patient was seen by Physical Therapy who recommended no further     intervention for him.  The patient had been on 81 mg of aspirin and     this was increased to 325 mg.  Additionally, the patient is     allergic to HMG-CoA reductase and thus could not be placed on a     statin.  The patient does have hyperlipidemia as noted in his risk     stratification profile and is on Welchol which he will continue on.     During this hospitalization, the patient showed no evidence of     uncontrolled rate of atrial fibrillation, and his rate is     controlled well with Cardizem and Lanoxin. 2. Dyspepsia and gastroesophageal reflux disease.  The patient came to     the hospital having been issued his prescription for Protonix by     his primary care physician.  The patient however had stopped taking     his Protonix at home and started taking Prilosec again.  He felt     that the Protonix was ineffective and felt that his Prilosec was     much more effective in controlling his symptoms.  During the     hospitalization, the patient refused Protonix, and omeprazole was     substituted in its place.  At the time of discharge, the patient is     being put back on Prilosec 40 mg p.o. daily and I will defer to his     primary care physician in followup to make further decisions     regarding this. 3. Hypothyroidism.  The patient is continued on his usual dose of     Synthroid without any problems. 4. Diabetes type 2.  Blood sugars were well controlled in the hospital     ranging between the 120s to 140s and he is  continued on his usual     dose of insulin regimen.  Otherwise, the patient was stable with     his chronic medical issues, and his condition at discharge is     stable.  PHYSICAL EXAMINATION:  GENERAL:  The patient is well appearing.  He does not appear to be in any distress.  VITAL SIGNS:  Temperature is 97.5, heart rate 63, respirations 18, blood pressure 150/76, and O2 sats are 97% on room air. HEENT:  He is normocephalic, atraumatic.  Pupils are equally round and reactive to light and accommodation.  Extraocular movements are intact. Oropharynx moist.  No exudate, erythema, or lesions are noted. NECK:  Trachea is midline.  No masses, no thyromegaly, no JVD, no carotid bruit. LUNGS:  The patient's lungs are clear to auscultation.  There is no wheezing or rhonchi noted. CARDIAC EXAMINATION:  He has got a normal S1-S2.  There are no murmurs, rubs, or gallops noted.  PMI is nondisplaced.  There are no heaves or thrills on palpation. ABDOMEN:  Obese, soft, nontender, and nondistended.  No masses.  No hepatosplenomegaly is noted. LYMPH NODES:  No cervical, axillary, or inguinal lymphadenopathy. PSYCHIATRIC:  He is alert and oriented x3.  Good insight and cognition. Good recent and remote recall. NEUROLOGICAL:  The patient has no focal neurological deficits.  His symptoms have completely resolved, and cranial nerves II-XII are grossly intact. EXTREMITIES:  No clubbing, cyanosis, or edema.  DIETARY RESTRICTIONS:  The patient should be on a heart-healthy diet, carb modified.  PHYSICAL RESTRICTIONS:  None.  FOLLOWUP:  The patient should follow up with primary care physician, Dr. Alwyn Ren in 1-2 weeks or as necessary.     Altha Harm, MD     MAM/MEDQ  D:  03/10/2010  T:  03/10/2010  Job:  [id number]  cc:   Titus Dubin. Alwyn Ren, MD,FACP,FCCP [phone] [place] [address]  Hartley Barefoot, MD  Electronically Signed by Marthann Schiller MD on 03/19/2010  09:56:56 PM

## 2010-03-24 ENCOUNTER — Encounter (INDEPENDENT_AMBULATORY_CARE_PROVIDER_SITE_OTHER): Payer: Self-pay | Admitting: *Deleted

## 2010-03-24 ENCOUNTER — Other Ambulatory Visit (INDEPENDENT_AMBULATORY_CARE_PROVIDER_SITE_OTHER): Payer: Medicare Other

## 2010-03-24 ENCOUNTER — Other Ambulatory Visit: Payer: Self-pay | Admitting: Internal Medicine

## 2010-03-24 DIAGNOSIS — E119 Type 2 diabetes mellitus without complications: Secondary | ICD-10-CM

## 2010-03-24 LAB — CBC
HCT: 42.3 % (ref 39.0–52.0)
Hemoglobin: 13.6 g/dL (ref 13.0–17.0)
Hemoglobin: 15.1 g/dL (ref 13.0–17.0)
MCH: 30.6 pg (ref 26.0–34.0)
MCHC: 32.2 g/dL (ref 30.0–36.0)
Platelets: 189 10*3/uL (ref 150–400)
RBC: 4.84 MIL/uL (ref 4.22–5.81)
WBC: 9.2 10*3/uL (ref 4.0–10.5)

## 2010-03-24 LAB — BASIC METABOLIC PANEL
BUN: 15 mg/dL (ref 6–23)
Calcium: 8.5 mg/dL (ref 8.4–10.5)
Calcium: 8.6 mg/dL (ref 8.4–10.5)
Chloride: 104 mEq/L (ref 96–112)
Creatinine, Ser: 1.08 mg/dL (ref 0.4–1.5)
GFR calc Af Amer: >60 mL/min (ref >60)
GFR calc non Af Amer: >60 mL/min (ref >60)
Glucose, Bld: 81 mg/dL (ref 70–99)
Sodium: 138 mEq/L (ref 135–145)
Sodium: 141 mEq/L (ref 135–145)

## 2010-03-24 LAB — CARDIAC PANEL(CRET KIN+CKTOT+MB+TROPI)
CK, MB: 3.2 ng/mL (ref 0.3–4.0)
Relative Index: INVALID (ref 0.0–2.5)
Total CK: 40 U/L (ref 7–232)
Total CK: 49 U/L (ref 7–232)

## 2010-03-24 LAB — D-DIMER, QUANTITATIVE: D-Dimer, Quant: 0.25 ug/mL-FEU (ref 0.00–0.48)

## 2010-03-24 LAB — LIPID PANEL
HDL: 42 mg/dL (ref >39)
LDL Cholesterol: 131 mg/dL — ABNORMAL HIGH (ref 0–99)
Triglycerides: 95 mg/dL (ref <150)
VLDL: 19 mg/dL (ref 0–40)

## 2010-03-24 LAB — DIFFERENTIAL
Lymphocytes Relative: 16 % (ref 12–46)
Lymphs Abs: 1.5 10*3/uL (ref 0.7–4.0)
Monocytes Relative: 11 % (ref 3–12)
Neutrophils Relative %: 70 % (ref 43–77)

## 2010-03-24 LAB — POCT CARDIAC MARKERS
CKMB, poc: 1.9 ng/mL (ref 1.0–8.0)
Myoglobin, poc: 82.4 ng/mL (ref 12–200)
Troponin i, poc: <0.05 ng/mL (ref 0.00–0.09)

## 2010-03-24 LAB — HEMOGLOBIN A1C: Hgb A1c MFr Bld: 6.9 % — ABNORMAL HIGH (ref 4.6–6.5)

## 2010-03-25 ENCOUNTER — Ambulatory Visit: Payer: Self-pay | Admitting: Internal Medicine

## 2010-03-25 NOTE — Assessment & Plan Note (Signed)
Summary: STILL FALLING/RH....   Vital Signs:  Patient profile:   77 year old male Weight:      172.4 pounds BMI:     24.13 Temp:     98.1 degrees F oral Pulse rate:   80 / minute Resp:     16 per minute BP sitting:   124 / 70  (left arm) Cuff size:   regular  Vitals Entered By: Shonna Chock CMA (March 17, 2010 10:36 AM) CC: 1.) F/U from the hospital: falling  2.) Discuss plavix, med was rx'ed in the hospital, patient did NOT start b/c he does not understand why it was rx'ed, Syncope   Primary Care Provider:  Rockie Neighbours  CC:  1.) F/U from the hospital: falling  2.) Discuss plavix, med was rx'ed in the hospital, patient did NOT start b/c he does not understand why it was rx'ed, and Syncope.  History of Present Illness:          This is a 77 year old man who presents for post hospital F/U of recurrent truncal ataxia. D/C Summary reviewed; no appt with Dr Pearlean Brownie yet.  The patient reports lightheadedness, but denies loss of consciousness, near loss of consciousness, palpitations, chest pain, shortness of breath, and incontinence.  The patient reports the following precipitating factors: change in position, sitting up in bed.    Current Medications (verified): 1)  Furosemide 20 Mg Tabs (Furosemide) Dye Free .... Take 1 Tab Once Daily**dye Free** 2)  Synthroid 50 Mcg  Tabs (Levothyroxine Sodium) .... 3 Tabs Daily Except  4 Pills Weds & Sun 3)  Lanoxin 0.25 Mg  Tabs (Digoxin) .Marland Kitchen.. 1 By Mouth Once Daily 4)  Matzim La 240 Mg Xr24h-Tab (Diltiazem Hcl Coated Beads) .... Daw- 1 By Mouth Once Daily 5)  Welchol 625 Mg  Tabs (Colesevelam Hcl) .... 6 Tabs Once Daily Patient 6)  Aspirin 81 Mg  Tabs (Aspirin) .... Take 1 Tablet By Mouth Once A Day 7)  Humulin R 100 Unit/ml Inj Soln (Insulin Regular Human) .... Sliding Scale 8)  Lantus 100 Unit/ml  Soln (Insulin Glargine) .... Uses 15-20 Units At Bedtime But Can Go As High As 35units Qhs 9)  Must Have Meds Listed Do Not Substitute Generics Patient All  .... Patient Allergic To Dyes 10)  Vitamin D 1000 Unit  Caps (Cholecalciferol) .... Take 1 Capsule By Mouth Once A Day 11)  Zolpidem Tartrate 5 Mg  Tabs (Zolpidem Tartrate) .Marland Kitchen.. 1 At Bedtime Occasionally For Sleep 12)  Bd Insulin Syringe U-100 1 Ml  Misc (Insulin Syringes (Disposable)) .... Uses Three Times Daily 13)  Proair Hfa 108 (90 Base) Mcg/act  Aers (Albuterol Sulfate) .... Inhale 2 Puffs Four Times A Day As Needed For Shortness of Breath 14)  Prilosec Otc 20 Mg Tbec (Omeprazole Magnesium) .Marland Kitchen.. 1 By Mouth Once Daily 15)  Diabetic Shoes -250.00 .Marland Kitchen.. 1 Pair-  250.00 16)  Bayer Contour Test  Strp (Glucose Blood) .... Check Bloodsugar 4 X Daily 17)  Lancets  Misc (Lancets) .... Check Bloodsugar 4 X Daily  Dx: 250.00 18)  Benadryl 25 Mg Tabs (Diphenhydramine Hcl) .... Take 1 By Mouth Once Daily As Needed 19)  Hydroxyzine Pamoate 25 Mg Caps (Hydroxyzine Pamoate) .Marland Kitchen.. 1 Every 6 Hrs As Needed For Itching , Hives 20)  Antivert 25 Mg Tabs (Meclizine Hcl) .Marland Kitchen.. 1 By Mouth Every 6 Hrs As Needed. 21)  Meclizine Hcl 12.5 Mg Tabs (Meclizine Hcl) .... Three Times A Day As Needed  Allergies: 1)  !  Codeine 2)  ! * Tetanus 3)  ! Zocor 4)  ! Lipitor 5)  ! Zetia 6)  ! Crestor 7)  ! Percocet 8)  ! * Nitro 9)  ! * Dyes 10)  * Iv Dye  Review of Systems ENT:  Denies ear discharge and earache; No new hearing loss. Neuro:  Denies brief paralysis, sensation of room spinning, and weakness.  Physical Exam  General:  in no acute distress; alert,appropriate and cooperative throughout examination Eyes:  No corneal or conjunctival inflammation noted. EOMI. No nystagmus Ears:   Hearing is grossly decreased  bilaterally. Heart:  normal rate, regular rhythm, no gallop, no rub, no JVD, and grade 1 /6 systolic murmur.   Neurologic:  alert & oriented X3, strength normal in all extremities, and DTRs symmetrical and normal.   Psych:  normally interactive, good eye contact, and not anxious appearing.      Impression & Recommendations:  Problem # 1:  DIZZINESS (ICD-780.4)  BPV suggested since D/C; Vestibular Rehab Rxed but declined. He requets referral to [facility] His updated medication list for this problem includes:    Benadryl 25 Mg Tabs (Diphenhydramine hcl) .Marland Kitchen... Take 1 by mouth once daily as needed    Antivert 25 Mg Tabs (Meclizine hcl) .Marland Kitchen... 1 by mouth every 6 hrs as needed.    Meclizine Hcl 12.5 Mg Tabs (Meclizine hcl) .Marland Kitchen... Three times a day as needed  Orders: Prescription Created Electronically [phone]) Neurology Referral (Neuro)  Problem # 2:  ATAXIA (ICD-781.3) Truncal , see D/C Summary Orders: Neurology Referral (Neuro)  Complete Medication List: 1)  Furosemide 20 Mg Tabs (furosemide) Dye Free  .... Take 1 tab once daily**dye free** 2)  Synthroid 50 Mcg Tabs (Levothyroxine sodium) .... 3 tabs daily except  4 pills weds & sun 3)  Lanoxin 0.25 Mg Tabs (Digoxin) .Marland Kitchen.. 1 by mouth once daily 4)  Matzim La 240 Mg Xr24h-tab (Diltiazem hcl coated beads) .... Daw- 1 by mouth once daily 5)  Welchol 625 Mg Tabs (Colesevelam hcl) .... 6 tabs once daily patient 6)  Aspirin 81 Mg Tabs (Aspirin) .... Take 1 tablet by mouth once a day 7)  Humulin R 100 Unit/ml Inj Soln (Insulin regular human) .... Sliding scale 8)  Lantus 100 Unit/ml Soln (Insulin glargine) .... Uses 15-20 units at bedtime but can go as high as 35units qhs 9)  Must Have Meds Listed Do Not Substitute Generics Patient All  .... Patient allergic to dyes 10)  Vitamin D 1000 Unit Caps (Cholecalciferol) .... Take 1 capsule by mouth once a day 11)  Zolpidem Tartrate 5 Mg Tabs (Zolpidem tartrate) .Marland Kitchen.. 1 at bedtime occasionally for sleep 12)  Bd Insulin Syringe U-100 1 Ml Misc (Insulin syringes (disposable)) .... Uses three times daily 13)  Proair Hfa 108 (90 Base) Mcg/act Aers (Albuterol sulfate) .... Inhale 2 puffs four times a day as needed for shortness of breath 14)  Prilosec Otc 20 Mg Tbec (Omeprazole magnesium) .Marland Kitchen.. 1 by  mouth once daily 15)  Diabetic Shoes -250.00  .Marland Kitchen.. 1 pair-  250.00 16)  Bayer Contour Test Strp (Glucose blood) .... Check bloodsugar 4 x daily 17)  Lancets Misc (Lancets) .... Check bloodsugar 4 x daily  dx: 250.00 18)  Benadryl 25 Mg Tabs (Diphenhydramine hcl) .... Take 1 by mouth once daily as needed 19)  Hydroxyzine Pamoate 25 Mg Caps (Hydroxyzine pamoate) .Marland Kitchen.. 1 every 6 hrs as needed for itching , hives 20)  Antivert 25 Mg Tabs (Meclizine hcl) .Marland Kitchen.. 1 by  mouth every 6 hrs as needed. 21)  Meclizine Hcl 12.5 Mg Tabs (Meclizine hcl) .... Three times a day as needed 22)  Diazepam 2 Mg Tabs (Diazepam) .Marland Kitchen.. 1 every 8 hrs as needed for dizziness in place of meclizine  Patient Instructions: 1)  Trial of Diazepam three times a day as needed for dizziness Prescriptions: DIAZEPAM 2 MG TABS (DIAZEPAM) 1 every 8 hrs as needed for dizziness in place of Meclizine  #30 x 1   Entered and Authorized by:   Marga Melnick MD   Signed by:   Marga Melnick MD on 03/17/2010   Method used:   Printed then faxed to ...       [facility]  [address] [phone]* (retail)       [address]       [place], [place]  [address]       Ph: [phone]       Fax: [phone]   RxID:   [id number]    Orders Added: 1)  Est. Patient Level III [44010] 2)  Prescription Created Electronically [[id number]] 3)  Neurology Referral [Neuro]

## 2010-03-25 NOTE — Discharge Summary (Signed)
NAME:  [name], [name] NO.:  [id number]  MEDICAL RECORD NO.:  [id number]           PATIENT TYPE:  I  LOCATION:  6737                         FACILITY:  [facility]  PHYSICIAN:  Erick Blinks, MD     DATE OF BIRTH:  [birth year]  DATE OF ADMISSION:  03/13/2010 DATE OF DISCHARGE:                        DISCHARGE SUMMARY - REFERRING   PRIMARY CARE PHYSICIAN:  Dr. Marga Melnick.  DISCHARGE DIAGNOSES: 1. Recurrent truncal ataxia. 2. Type 2 diabetes, insulin dependent. 3. Hyperlipidemia. 4. Hypothyroidism. 5. Hypertension. 6. Paroxysmal atrial fibrillation, not on anticoagulation. 7. Multiple drug allergies. 8. Macular degeneration. 9. Osteoarthritis.  DISCHARGE MEDICATIONS: 1. Meclizine 12.5 mg p.o. t.i.d. p.r.n. 2. Clopidogrel 75 mg p.o. daily. 3. Synthroid 50 mcg 3 tabs daily except Wednesday and Sunday 4 tablets     p.o. daily. 4. Ocuvite 1 tablet p.o. b.i.d. 5. Lantus 50-25 units subcutaneous q.h.s. 6. Humulin R 5-8 units subcu t.i.d. 7. Lanoxin 0.25 mg 1 tablet p.o. daily. 8. Diltiazem CD 240 mg p.o. daily. 9. Lasix 20 mg p.o. daily. 10.Vitamin D 1000 units p.o. daily. 11.WelChol 625 mg 2 tablets p.o. t.i.d. 12.Tylenol 500 mg 2 tablets p.o. q.6 h. p.r.n. 13.Prilosec 40 mg 1 tablet p.o. daily. 14.Tums over-the-counter 2 tablets p.o. daily p.r.n.  Medications stopped in the hospital, aspirin 325 mg daily and has been replaced by Plavix.  ADMISSION HISTORY:  This is a 77 year old gentleman with multiple medical problems who presents to emergency room with complaints of unsteady gait and dizziness.  He was recently discharged from [facility] on March 10, 2010.  He was thought to have mild cerebral  infarct.  It was not exactly clear per MRI if there was infected infarct, although it was very very small and difficult to ascertain.  The patient re-presented to the hospital on March 01 with complaints of increasing dizziness.  The patient says  he was unable to walk and reports the ER for evaluation.  CT done in the emergency room without contrast was negative for any acute abnormality.  The patient was subsequently admitted for further evaluation.  For further details, please refer to the history and physical dictated by Dr. Venetia Constable on March 01.  HOSPITAL COURSE:  Truncal ataxia.  The patient was seen in consultation by Dr. Pearlean Brownie who recommended repeat stroke workup.  He had a repeat MRI done of the brain which did not show any acute infarct but did show atrophy and mild chronic microvascular ischemia.  His MRA of the head was negative for any acute abnormalities.  MRA of the neck done was also found to be negative.  An MRI of the C-spine was then pursued to check for any myelopathy, this was shown to be negative for cord compression or cord edema and did show cervical degenerative changes as without spinal stenosis.  Labs including B12, RPR, and TSH were checked and were found to be within normal limits.  It was recommended to discharge the patient with vestibular therapy.  His symptoms have since improved. During this hospitalization, he had no further ataxia.  It was recommended to discharge him with vestibular therapy as  well as follow up with Dr. Pearlean Brownie for further testing including nerve conduction studies and EMGs to check for neuropathy.  Plan was discussed with the patient as well as his wife for an agreement.  The remainder the patient's medical problems have remained stable.  CONSULTATION:  Neurology, Dr. Pearlean Brownie.  PROCEDURES:  None.  DISCHARGE INSTRUCTIONS:  The patient should follow up with Dr. Pearlean Brownie in the next 4 weeks.  He will be given the number to schedule an appointment.  He should follow up with his primary care physician next week and continue on a heart-healthy diet and activity as tolerated. Time spent is 50 minutes.     Erick Blinks, MD     JM/MEDQ  D:  03/15/2010  T:  03/15/2010  Job:   [id number]  cc:   Titus Dubin. Alwyn Ren, MD,FACP,FCCP  Electronically Signed by Erick Blinks  on 03/25/2010 07:40:11 PM

## 2010-03-28 NOTE — Consult Note (Signed)
NAME:  [name], [name] NO.:  [id number]  MEDICAL RECORD NO.:  [id number]           PATIENT TYPE:  I  LOCATION:  6737                         FACILITY:  [facility]  PHYSICIAN:  Neyda Durango P. Pearlean Brownie, MD    DATE OF BIRTH:  04-Apr-1931  DATE OF CONSULTATION:  03/13/2010 DATE OF DISCHARGE:                                CONSULTATION   REFERRING PHYSICIAN:  Conley Canal, MD  REASON FOR REFERRAL:  Gait ataxia, dizziness.  HISTORY OF PRESENT ILLNESS:  [name] is a 77 year old Caucasian gentleman who has been having subacute gait imbalance and dizziness difficulties for the last 5 days.  He describes this as feeling of imbalance, staggering gait, tendency to fall backwards.  He denies nausea, vertigo, double vision, blurred vision, focal extremity weakness, and numbness.  He was seen at [facility] on March 07, 2010, and had initially a CT scan followed by an MRI scan, which showed 2 tiny questionable diffusion positive lesions involving the right middle cerebellar peduncle as well as right pons, which were felt to be artifact versus small infarcts.  His symptoms improved.  He was sent home.  He was walking with a walker, but he has had some intermittent fluctuations with his dizziness and balance problems including this morning when he got up feeling staggering, barely walk, and kept on falling backwards.  His symptoms again improved since arrival and is able to walk better now.  He has known prior history of stroke, TIA, seizures, significant neurological problems.  PAST MEDICAL HISTORY:  Significant for, 1. Hypertension. 2. Diabetes. 3. Coronary artery disease. 4. Hypothyroidism. 5. Kidney stone. 6. Prostate cancer. 7. Asthma. 8. Chronic back pain. 9. Ventricular tachycardia.  PAST SURGICAL HISTORY:  Bilateral knee replacement.  SOCIAL HISTORY:  Married.  Lives with his wife.  Does not smoke or drink.  Is independent in activities of daily  living.  HOME MEDICATIONS:  Synthroid, diltiazem, vitamin D, furosemide, colesevelam, Lanoxin, aspirin.  REVIEW OF SYSTEMS:  As documented above in history of present illness.  PHYSICAL EXAMINATION:  GENERAL:  Pleasant middle-aged Caucasian gentleman who is currently not in distress. VITAL SIGNS:  He is afebrile, pulse rate is 60 per minute and regular sinus, blood pressure is 159/76, respiratory rate 20 per minute, oxygen sats 99% on room air. HEENT:  Head is nontraumatic.  Hearing is normal. NECK:  Supple.  There is no bruit. CARDIAC:  No murmur or gallop. LUNGS:  Clear to auscultation. NEUROLOGIC:  He is awake, alert, and oriented to time, place, and person.  Speech and language appear normal.  He has intact attention, registration, and recall.  Eye movements are full range.  I do not see any nystagmus.  Saccades are slow when he looks to the left.  Face is symmetric.  Tongue is midline.  Motor system exam reveals no upper extremity drift, no focal weakness.  He walks a slightly broad-based, but steady gait.  He is slightly unsteady when he is standing on a narrow base, tends to lean backwards, can stand on his toes, but has slight difficulty standing on his heels and  while walking tandem.  DATA REVIEWED:  MRI scan of the brain from March 07, 2010, was reviewed and shows 2 tiny areas of diffusion positivity in the right middle cerebellar peduncle and right brain stem, which are questionable acute infarcts.  MRA was not done.  CT scan of the head done today is unremarkable.  IMPRESSION:  A 77 year old gentleman with fluctuating symptoms of dizziness and gait imbalance for the last 5 days, possibly subacute small brainstem/right cerebellar infarcts, etiology likely small-vessel disease.  PLAN:  I would recommend repeating MRI scan of the brain for this time with MRA of the brain and neck as well.  Check carotid Dopplers, fasting lipid profile, hemoglobin A1c.  Change  aspirin to Plavix for stroke prevention.  Physical, occupational, and speech therapy consults.  I had a long discussion with the patient and his wife regarding his symptoms, plan for evaluation, and treatment, answered questions.  I will be happy to follow the patient in consults.  Kindly call for questions.     [name] P. Pearlean Brownie, MD     PPS/MEDQ  D:  03/13/2010  T:  03/14/2010  Job:  [id number]  Electronically Signed by Delia Heady MD on 03/28/2010 11:15:32 AM

## 2010-03-31 ENCOUNTER — Encounter: Payer: Self-pay | Admitting: Internal Medicine

## 2010-03-31 ENCOUNTER — Ambulatory Visit (INDEPENDENT_AMBULATORY_CARE_PROVIDER_SITE_OTHER): Payer: Medicare Other | Admitting: Internal Medicine

## 2010-03-31 DIAGNOSIS — E119 Type 2 diabetes mellitus without complications: Secondary | ICD-10-CM

## 2010-04-03 ENCOUNTER — Telehealth: Payer: Self-pay | Admitting: *Deleted

## 2010-04-03 NOTE — Telephone Encounter (Signed)
Spoke w/ pt says he received shot on Mon. And now blood sugar is at 110 today informed pt that this is normal side effect of cortisone shot pt ok'd information.

## 2010-04-03 NOTE — Telephone Encounter (Signed)
Left msg on voicemail for return call. 

## 2010-04-10 NOTE — Assessment & Plan Note (Signed)
Summary: 4 month followup/sph   Vital Signs:  Patient profile:   77 year old male Weight:      175.4 pounds BMI:     24.55 Pulse rate:   72 / minute Resp:     15 per minute BP sitting:   122 / 80  (left arm) Cuff size:   large  Vitals Entered By: Shonna Chock CMA (March 31, 2010 3:25 PM) CC: 1.) 4 month follow-up: discuss labs  2.) Patient seen by [facility] Neurologist Dr.Sethi, rx'ed Sinemet but patient didnt start yet , Type 2 diabetes mellitus follow-up   Primary Care Provider:  Rockie Neighbours  CC:  1.) 4 month follow-up: discuss labs  2.) Patient seen by [facility] Neurologist Dr.Sethi, rx'ed Sinemet but patient didnt start yet , and Type 2 diabetes mellitus follow-up.  History of Present Illness:    Dr Pearlean Brownie has diagnosed possible Parkinson's & Rxed Sinemet. He does not want to take it; he has an appt @ [facility] in 06/2010 for 2nd opinion.                                                                                                             His A1c is 6.9%( average sugar 151, risk 48% long term). He  reports weight gain of 6#, but denies polyuria, polydipsia, blurred vision, self managed hypoglycemia, and numbness of extremities.  The patient denies the following symptoms: neuropathic pain, chest pain, poor wound healing, vision loss, and foot ulcer.  Since the last visit the patient reports good dietary compliance and monitoring blood glucose.  The patient has been measuring capillary blood glucose before breakfast, 90-115.  Two hrs  after dinner < 200, usually 160-170.    Current Medications (verified): 1)  Furosemide 20 Mg Tabs (Furosemide) Dye Free .... Take 1 Tab Once Daily**dye Free** 2)  Synthroid 50 Mcg  Tabs (Levothyroxine Sodium) .... 3 Tabs Daily Except  4 Pills Weds & Sun 3)  Lanoxin 0.25 Mg  Tabs (Digoxin) .Marland Kitchen.. 1 By Mouth Once Daily 4)  Matzim La 240 Mg Xr24h-Tab (Diltiazem Hcl Coated Beads) .... Daw- 1 By Mouth Once Daily 5)  Welchol 625 Mg  Tabs (Colesevelam Hcl) .... 6 Tabs  Once Daily Patient 6)  Aspirin 81 Mg  Tabs (Aspirin) .... Take 1 Tablet By Mouth Once A Day 7)  Humulin R 100 Unit/ml Inj Soln (Insulin Regular Human) .... Sliding Scale 8)  Lantus 100 Unit/ml  Soln (Insulin Glargine) .... Uses 15-20 Units At Bedtime But Can Go As High As 35units Qhs 9)  Must Have Meds Listed Do Not Substitute Generics Patient All .... Patient Allergic To Dyes 10)  Vitamin D 1000 Unit  Caps (Cholecalciferol) .... Take 1 Capsule By Mouth Once A Day 11)  Zolpidem Tartrate 5 Mg  Tabs (Zolpidem Tartrate) .Marland Kitchen.. 1 At Bedtime Occasionally For Sleep 12)  Bd Insulin Syringe U-100 1 Ml  Misc (Insulin Syringes (Disposable)) .... Uses Three Times Daily 13)  Proair Hfa 108 (90 Base) Mcg/act  Aers (Albuterol Sulfate) .... Inhale 2 Puffs  Four Times A Day As Needed For Shortness of Breath 14)  Prilosec Otc 20 Mg Tbec (Omeprazole Magnesium) .Marland Kitchen.. 1 By Mouth Once Daily 15)  Diabetic Shoes -250.00 .Marland Kitchen.. 1 Pair-  250.00 16)  Bayer Contour Test  Strp (Glucose Blood) .... Check Bloodsugar 4 X Daily 17)  Lancets  Misc (Lancets) .... Check Bloodsugar 4 X Daily  Dx: 250.00 18)  Benadryl 25 Mg Tabs (Diphenhydramine Hcl) .... Take 1 By Mouth Once Daily As Needed 19)  Hydroxyzine Pamoate 25 Mg Caps (Hydroxyzine Pamoate) .Marland Kitchen.. 1 Every 6 Hrs As Needed For Itching , Hives 20)  Antivert 25 Mg Tabs (Meclizine Hcl) .Marland Kitchen.. 1 By Mouth Every 6 Hrs As Needed. 21)  Meclizine Hcl 12.5 Mg Tabs (Meclizine Hcl) .... Three Times A Day As Needed 22)  Diazepam 2 Mg Tabs (Diazepam) .Marland Kitchen.. 1 Every 8 Hrs As Needed For Dizziness in Place of Meclizine  Allergies: 1)  ! Codeine 2)  ! * Tetanus 3)  ! Zocor 4)  ! Lipitor 5)  ! Zetia 6)  ! Crestor 7)  ! Percocet 8)  ! * Nitro 9)  ! * Dyes 10)  * Iv Dye  Physical Exam  General:  alert,appropriate and cooperative throughout examination Heart:  Normal rate and regular rhythm. S1 and S2 normal without gallop, murmur, click, rub.S4 Extremities:  No clubbing, cyanosis, edema.  Chronic R great toe nail changes Neurologic:  sensation intact to light touch over feet.   Skin:  Intact without suspicious lesions or rashes Psych:  memory intact for recent and remote, normally interactive, and good eye contact.     Impression & Recommendations:  Problem # 1:  DM (ICD-250.00) controlled His updated medication list for this problem includes:    Aspirin 81 Mg Tabs (Aspirin) .Marland Kitchen... Take 1 tablet by mouth once a day    Humulin R 100 Unit/ml Inj Soln (Insulin regular human) ..... Sliding scale    Lantus 100 Unit/ml Soln (Insulin glargine) ..... Uses 15-20 units at bedtime but can go as high as 35units at bedtime (typical dose 15-20)  Complete Medication List: 1)  Furosemide 20 Mg Tabs (furosemide) Dye Free  .... Take 1 tab once daily**dye free** 2)  Synthroid 50 Mcg Tabs (Levothyroxine sodium) .... 3 tabs daily except  4 pills weds & sun 3)  Lanoxin 0.25 Mg Tabs (Digoxin) .Marland Kitchen.. 1 by mouth once daily 4)  Matzim La 240 Mg Xr24h-tab (Diltiazem hcl coated beads) .... Daw- 1 by mouth once daily 5)  Welchol 625 Mg Tabs (Colesevelam hcl) .... 6 tabs once daily patient 6)  Aspirin 81 Mg Tabs (Aspirin) .... Take 1 tablet by mouth once a day 7)  Humulin R 100 Unit/ml Inj Soln (Insulin regular human) .... Sliding scale 8)  Lantus 100 Unit/ml Soln (Insulin glargine) .... Uses 15-20 units at bedtime but can go as high as 35units qhs 9)  Must Have Meds Listed Do Not Substitute Generics Patient All  .... Patient allergic to dyes 10)  Vitamin D 1000 Unit Caps (Cholecalciferol) .... Take 1 capsule by mouth once a day 11)  Zolpidem Tartrate 5 Mg Tabs (Zolpidem tartrate) .Marland Kitchen.. 1 at bedtime occasionally for sleep 12)  Bd Insulin Syringe U-100 1 Ml Misc (Insulin syringes (disposable)) .... Uses three times daily 13)  Proair Hfa 108 (90 Base) Mcg/act Aers (Albuterol sulfate) .... Inhale 2 puffs four times a day as needed for shortness of breath 14)  Prilosec Otc 20 Mg Tbec (Omeprazole magnesium)  .Marland Kitchen.. 1 by mouth  once daily 15)  Diabetic Shoes -250.00  .Marland Kitchen.. 1 pair-  250.00 16)  Bayer Contour Test Strp (Glucose blood) .... Check bloodsugar 4 x daily 17)  Lancets Misc (Lancets) .... Check bloodsugar 4 x daily  dx: 250.00 18)  Benadryl 25 Mg Tabs (Diphenhydramine hcl) .... Take 1 by mouth once daily as needed 19)  Hydroxyzine Pamoate 25 Mg Caps (Hydroxyzine pamoate) .Marland Kitchen.. 1 every 6 hrs as needed for itching , hives 20)  Antivert 25 Mg Tabs (Meclizine hcl) .Marland Kitchen.. 1 by mouth every 6 hrs as needed. 21)  Meclizine Hcl 12.5 Mg Tabs (Meclizine hcl) .... Three times a day as needed 22)  Diazepam 2 Mg Tabs (Diazepam) .Marland Kitchen.. 1 every 8 hrs as needed for dizziness in place of meclizine  Patient Instructions: 1)  Check your blood sugars regularly. If your readings are usually above :150  or below 90 you should contact our office. 2)  It is important that your Diabetic A1c level is checked every  4 months. 3)  See your eye doctor yearly to check for diabetic eye damage. 4)  Check your feet each night for sore areas, calluses or signs of infection. Prescriptions: DIAZEPAM 2 MG TABS (DIAZEPAM) 1 every 8 hrs as needed for dizziness in place of Meclizine  #30 x 2   Entered and Authorized by:   Marga Melnick MD   Signed by:   Marga Melnick MD on 03/31/2010   Method used:   Printed then faxed to ...       [facility]  [address] [phone]* (retail)       [address]       [place], [place]  [address]       Ph: [phone]       Fax: [phone]   RxID:   [id number]    Orders Added: 1)  Est. Patient Level III [72536]

## 2010-04-28 LAB — GLUCOSE, CAPILLARY
Glucose-Capillary: 105 mg/dL — ABNORMAL HIGH (ref 70–99)
Glucose-Capillary: 106 mg/dL — ABNORMAL HIGH (ref 70–99)
Glucose-Capillary: 118 mg/dL — ABNORMAL HIGH (ref 70–99)
Glucose-Capillary: 128 mg/dL — ABNORMAL HIGH (ref 70–99)
Glucose-Capillary: 136 mg/dL — ABNORMAL HIGH (ref 70–99)
Glucose-Capillary: 136 mg/dL — ABNORMAL HIGH (ref 70–99)
Glucose-Capillary: 142 mg/dL — ABNORMAL HIGH (ref 70–99)
Glucose-Capillary: 148 mg/dL — ABNORMAL HIGH (ref 70–99)
Glucose-Capillary: 152 mg/dL — ABNORMAL HIGH (ref 70–99)
Glucose-Capillary: 155 mg/dL — ABNORMAL HIGH (ref 70–99)
Glucose-Capillary: 183 mg/dL — ABNORMAL HIGH (ref 70–99)
Glucose-Capillary: 88 mg/dL (ref 70–99)
Glucose-Capillary: 98 mg/dL (ref 70–99)

## 2010-04-28 LAB — CBC
HCT: 26.6 % — ABNORMAL LOW (ref 39.0–52.0)
HCT: 27 % — ABNORMAL LOW (ref 39.0–52.0)
HCT: 32.9 % — ABNORMAL LOW (ref 39.0–52.0)
Hemoglobin: 9.2 g/dL — ABNORMAL LOW (ref 13.0–17.0)
MCHC: 33.9 g/dL (ref 30.0–36.0)
MCV: 91.5 fL (ref 78.0–100.0)
MCV: 92 fL (ref 78.0–100.0)
MCV: 93.1 fL (ref 78.0–100.0)
Platelets: 158 10*3/uL (ref 150–400)
Platelets: 164 10*3/uL (ref 150–400)
Platelets: 200 10*3/uL (ref 150–400)
RBC: 2.94 MIL/uL — ABNORMAL LOW (ref 4.22–5.81)
RDW: 12.9 % (ref 11.5–15.5)
RDW: 13 % (ref 11.5–15.5)
RDW: 13.2 % (ref 11.5–15.5)
WBC: 11 10*3/uL — ABNORMAL HIGH (ref 4.0–10.5)
WBC: 8.3 10*3/uL (ref 4.0–10.5)
WBC: 8.5 10*3/uL (ref 4.0–10.5)

## 2010-04-28 LAB — BASIC METABOLIC PANEL
BUN: 11 mg/dL (ref 6–23)
BUN: 14 mg/dL (ref 6–23)
BUN: 17 mg/dL (ref 6–23)
CO2: 31 mEq/L (ref 19–32)
Calcium: 8.1 mg/dL — ABNORMAL LOW (ref 8.4–10.5)
Chloride: 100 mEq/L (ref 96–112)
Chloride: 99 mEq/L (ref 96–112)
Creatinine, Ser: 0.93 mg/dL (ref 0.4–1.5)
Creatinine, Ser: 0.95 mg/dL (ref 0.4–1.5)
GFR calc Af Amer: >60 mL/min (ref >60)
GFR calc non Af Amer: >60 mL/min (ref >60)
GFR calc non Af Amer: >60 mL/min (ref >60)
Glucose, Bld: 119 mg/dL — ABNORMAL HIGH (ref 70–99)
Glucose, Bld: 132 mg/dL — ABNORMAL HIGH (ref 70–99)
Glucose, Bld: 81 mg/dL (ref 70–99)
Potassium: 3.4 mEq/L — ABNORMAL LOW (ref 3.5–5.1)
Potassium: 3.9 mEq/L (ref 3.5–5.1)
Potassium: 4.1 mEq/L (ref 3.5–5.1)
Sodium: 136 mEq/L (ref 135–145)

## 2010-04-28 LAB — PROTIME-INR
INR: 1 (ref 0.00–1.49)
INR: 1.7 — ABNORMAL HIGH (ref 0.00–1.49)
INR: 2.1 — ABNORMAL HIGH (ref 0.00–1.49)
INR: 2.2 — ABNORMAL HIGH (ref 0.00–1.49)
Prothrombin Time: 13.7 seconds (ref 11.6–15.2)
Prothrombin Time: 20.6 seconds — ABNORMAL HIGH (ref 11.6–15.2)
Prothrombin Time: 24.9 seconds — ABNORMAL HIGH (ref 11.6–15.2)
Prothrombin Time: 25.6 seconds — ABNORMAL HIGH (ref 11.6–15.2)
Prothrombin Time: 31.4 seconds — ABNORMAL HIGH (ref 11.6–15.2)

## 2010-05-04 ENCOUNTER — Other Ambulatory Visit: Payer: Self-pay | Admitting: Internal Medicine

## 2010-05-14 ENCOUNTER — Encounter: Payer: Self-pay | Admitting: Internal Medicine

## 2010-05-19 ENCOUNTER — Ambulatory Visit (INDEPENDENT_AMBULATORY_CARE_PROVIDER_SITE_OTHER): Payer: Medicare Other | Admitting: Internal Medicine

## 2010-05-19 ENCOUNTER — Encounter: Payer: Self-pay | Admitting: Internal Medicine

## 2010-05-19 VITALS — BP 126/62 | HR 76 | Ht 69.0 in | Wt 174.0 lb

## 2010-05-19 DIAGNOSIS — G4700 Insomnia, unspecified: Secondary | ICD-10-CM | POA: Insufficient documentation

## 2010-05-19 DIAGNOSIS — L509 Urticaria, unspecified: Secondary | ICD-10-CM

## 2010-05-19 DIAGNOSIS — J411 Mucopurulent chronic bronchitis: Secondary | ICD-10-CM

## 2010-05-19 MED ORDER — ZOLPIDEM TARTRATE 5 MG PO TABS: 5.0000 mg | ORAL_TABLET | Freq: Every evening | ORAL | Status: DC | PRN

## 2010-05-19 MED ORDER — ZOLPIDEM TARTRATE 10 MG PO TABS: 5.0000 mg | ORAL_TABLET | Freq: Every evening | ORAL | Status: DC | PRN

## 2010-05-19 NOTE — Patient Instructions (Signed)
Good luck with the neurology problems  Script for Ambien. Please don't use this unless needed.

## 2010-05-19 NOTE — Progress Notes (Signed)
  Subjective:    Patient ID: [name], male    DOB: 24-Aug-1931, 77 y.o.   MRN: [id number]  HPI 05/19/10- 77 yoM followed for recurrent bronchitis complicated by hx of urticaria and PAFib.  Last here Decmber 6, 2011. Wive here now. He is going to [place] for second opinion about ? Parkinsons.  He notices that he coughs more, with some sputum, if he lies on right side. No recent antibiotics.  Increased watery nose with Spring pollen.  CXR- 12/25/09- clear, NAD.  Asks refill generic ambien for occasional nonspecific insomnia.  Review of Systems .see HPI Constitutional:   No weight loss, night sweats,  Fevers, chills, fatigue, lassitude. HEENT:   No headaches,  Difficulty swallowing,  Tooth/dental problems,  Sore throat,                CV:  No chest pain,  Orthopnea, PND, swelling in lower extremities, anasarca, dizziness, palpitations  GI  No heartburn, indigestion, abdominal pain, nausea, vomiting, diarrhea, change in bowel habits, loss of appetite  Resp: .  No excess mucus,No coughing up of blood.  No change in color of mucus.  No wheezing.  No chest wall deformity  Skin: no rash or lesions.  GU: no dysuria, change in color of urine, no urgency or frequency.  No flank pain.  MS:  No joint pain or swelling.  No decreased range of motion.  No back pain.  Psych:  No change in mood or affect. No depression or anxiety.  No memory loss.       Objective:   Physical Exam .  General- Alert, Oriented, Affect-appropriate, Distress- none acute  Skin- rash-none, lesions- none, excoriation- none  Lymphadenopathy- none  Head- atraumatic  Eyes- Gross vision intact, PERRLA, conjunctivae clear secretions  Ears- Hearing aide - right. Wax in canals,    Nose- Clear,  No- septal dev, mucus, polyps, erosion, perforation   Throat- Mallampati II , mucosa clear , drainage- none, tonsils- atrophic  Neck- flexible , trachea midline, no stridor , thyroid nl, carotid no bruit  Chest - symmetrical  excursion , unlabored     Heart/CV- RRR , no murmur , no gallop  , no rub, nl s1 s2                     - JVD- none , edema- none, stasis changes- none, varices- none     Lung- clear to P&A, wheeze- none, cough- none , dullness-none, rub- none     Chest wall-   Abd- tender-no, distended-no, bowel sounds-present, HSM- no  Br/ Gen/ Rectal- Not done, not indicated  Extrem- cyanosis- none, clubbing, none, atrophy- none, strength- nl  Neuro- Hesitant speech.   Assessment & Plan:

## 2010-05-19 NOTE — Assessment & Plan Note (Signed)
Sleep hygiene and management of occasional insomnia with med discussed. Will let him try the generic Remus Loffler he requests.

## 2010-05-25 ENCOUNTER — Encounter: Payer: Self-pay | Admitting: Internal Medicine

## 2010-05-25 DIAGNOSIS — J411 Mucopurulent chronic bronchitis: Secondary | ICD-10-CM | POA: Insufficient documentation

## 2010-05-25 NOTE — Assessment & Plan Note (Signed)
Often presents anxious about relatively mild bronchitis episodes.

## 2010-05-27 NOTE — Op Note (Signed)
NAME:  DVAUGHN, FICKLE NO.:  [id number]   MEDICAL RECORD NO.:  [id number]          PATIENT TYPE:  AMB   LOCATION:  SDS                          FACILITY:  [facility]   PHYSICIAN:  Ollen Gross. Vernell Morgans, M.D. DATE OF BIRTH:  [birth year]   DATE OF PROCEDURE:  04/07/2007  DATE OF DISCHARGE:                               OPERATIVE REPORT   PREOPERATIVE DIAGNOSIS:  Right inguinal hernia.   POSTOPERATIVE DIAGNOSIS:  Right indirect inguinal hernia.   PROCEDURE:  Right inguinal repair with mesh.   SURGEON:  Ollen Gross. Vernell Morgans, M.D.   ANESTHESIA:  General endotracheal.   PROCEDURE:  After informed consent was obtained, the patient was brought  to the operating room and placed in the supine position on the operating  room table.  After adequate induction of general anesthesia, the  patient's abdomen and the right groin were prepped with Betadine and  draped in the usual sterile manner.  The right groin area was then  infiltrated with 0.25% Marcaine.  A small incision was made from the  edge of pubic tubercle on the right towards the anterior superior iliac  spine.  This incision was carried down through the skin and subcutaneous  tissue sharply with the electrocautery until the fascia of the external  oblique was encountered.  The fascia of the external oblique was opened  along its fibers towards the apex of the external ring with #15 blade  knife and Metzenbaum scissors.  A Weitlaner retractor was deployed.  Blunt dissection was carried out over the cord structures until they  could be surrounded between two fingers.  A 1/2-inch Penrose drain was  placed around the cord structures for retraction purposes.  There did  not appear to be any direct defect in the floor of the canal.  The cord  structures were then gently skeletonized by a combination of blunt  hemostat dissection and some sharp dissection with the electrocautery  until a hernia sac was identified.  The hernia sac was  gently separated  from the rest of cord structures.  The hernia sac was then opened.  There were no visceral contents within the sac.  The sac was then  ligated at its base with a 2-0 silk suture ligature and the distal sac  excised and sent to pathology.  The stump of the sac was allowed to  retract back beneath the transversalis muscle.  A piece of Ultrapro mesh  was then chosen and cut to fit.  The mesh was sewn inferiorly to the  shelving edge of inguinal ligament with a running 2-0 Prolene stitch.  Tails were cut in the mesh laterally, and the tails were wrapped around  the cord structures superiorly.  The mesh was sewn to the muscular  aponeurotic strength layer of the transversalis with interrupted 2-0  Prolene vertical mattress stitches.  The tails of the mesh were anchored  to the shelving edge of the inguinal ligament lateral to the cord with  an interrupted 2-0 Prolene stitch.  Once this was accomplished, the mesh  appeared to be in good position,  without any tension.  The wound was  irrigated with copious amounts of saline.  The vas deferens and  testicular artery were palpable in the cord at this point, and the  testicular artery seemed to have good pulse.  The external oblique  fascia was then reapproximated with running 2-0 Vicryl stitch.  Prior to  doing this, the ilioinguinal nerve was also identified prior to placing  the mesh.  It was involved with some scar tissue and was therefore freed  up bluntly and then clamped proximally and distally with hemostats,  divided, and ligated 3-0 silk ties.  The external oblique was  reapproximated and the wound was infiltrated with 0.25% Marcaine.  The  subcutaneous fascia was closed with a  running 3-0 Vicryl stitch,  and the skin was closed with a running 4-0  Monocryl subcuticular stitch.  Dermabond dressing was applied.  The  patient tolerated the procedure well.  At the end of the case, all  needle, sponge, and instrument counts  were correct.  The patient was  then awakened and taken to recovery in stable condition.      Ollen Gross. Vernell Morgans, M.D.  Electronically Signed     PST/MEDQ  D:  04/07/2007  T:  04/08/2007  Job:  [id number]

## 2010-05-27 NOTE — Op Note (Signed)
NAME:  [name], [name] NO.:  [id number]   MEDICAL RECORD NO.:  [id number]          PATIENT TYPE:  INP   LOCATION:  5037                         FACILITY:  [facility]   PHYSICIAN:  Nadara Mustard, MD     DATE OF BIRTH:  April 12, 1931   DATE OF PROCEDURE:  01/18/2008  DATE OF DISCHARGE:                               OPERATIVE REPORT   PREOPERATIVE DIAGNOSIS:  Osteoarthritis of the left knee.   POSTOPERATIVE DIAGNOSIS:  Osteoarthritis of the left knee.   PROCEDURE:  Left total knee arthroplasty with DePuy components rotating  platform #4 tibia, #4 femur, 10-mm rotating platform poly tray with a 41-  mm patella.   SURGEON:  Nadara Mustard, MD   ANESTHESIA:  General plus femoral block.   ESTIMATED BLOOD LOSS:  Minimal.   ANTIBIOTICS:  Kefzol 2 g.   DRAINS:  None.   COMPLICATIONS:  None.   TOURNIQUET TIME:  300 mmHg at 24 minutes of the thigh.   DISPOSITION:  To PACU in stable condition.   INDICATIONS OF PROCEDURE:  The patient is a 77 year old gentleman with  osteoarthritis of his left knee.  He is status post a right total knee  arthroplasty with good relief and presents for left total knee  arthroplasty secondary to pain with activities of daily living and  failure of conservative care.  Risks and benefits were discussed  including infection, neurovascular injury, persistent pain, DVT,  pulmonary embolus, need for additional surgery.  The patient states he  understands and wished to proceed at this time.   DESCRIPTION OF PROCEDURE:  The patient was brought to OR room 10 and  underwent a general anesthetic after a femoral block.  After adequate  level of anesthesia obtained, the patient's left lower extremity was  prepped using DuraPrep, draped in a sterile field and Ioban was used to  cover all exposed skin.  A midline incision was made.  This was carried  down with a medial parapatellar retinacular incision.  The patella was  everted laterally.  The drill  was used for the femoral guide and the  femoral guide was placed at 5 degrees of valgus and 11 mm off the distal  femur.  The distal cutting block was placed and a distal cut was made.  The femur sized for approximately at 4.5 and the cutting block was set  for a size 4.  This was then placed with the cutting block.  Chamfer  cuts were made and the anterior aspect of the femur was beveled.  The  tibia was then sized.  The external alignment guide was used with  negative posterior slope and neutral varus and valgus.  This was pinned.  10 mm was taken off the tibia and this was sized for a size 4.  The size  4 cutting block was placed and the keel punch was then drilled for the  size 4 tibia.  The size 4 tibial tray trial was then placed.  The box  cut was then placed in the femur and the box cuts were made on the femur  for the size 4 femur.  The patella was then resurfaced with 10 mm taken  off the patella.  This was sized for a 41 and the lug cuts were made for  the 41 patella.  The femoral trial was placed.  The 10 mm trial tray was  placed.  The knee had full range of motion with full extension, stable  with varus and valgus stress.  The patella tracked midline and did not  sublux.  The trial instruments removed.  The lug cuts were made for the  femur.  The trial components were removed and the knee was irrigated  with pulsatile lavage.  The posterior aspect of the capsule was injected  with a total of 40 mL of 0.5% Marcaine plain.  Care was taken to ensure  there was no intravascular injection.  The cement was mixed.  The knee  was irrigated with pulsatile lavage.  The tibial tray was cemented and  loose cement was removed.  The femoral component was cemented and loose  cement was removed.  Pulsatile lavage was performed.  The tibial tray  was placed and the knee was kept in extension until the cement had  hardened.  The patella was placed and the patella clamp was left in  place until  the cement had hardened.  All loose cement was removed.  Continuous pulsatile lavage was performed.  The tourniquet was deflated  after approximately 24 minutes.  Hemostasis was obtained.  The knee was  placed through a full range of motion.  There was a midline tracking of  the patella.  The retinaculum was closed using #1 Vicryl, subcu was  closed using 2-0 Vicryl, the skin was closed using approximated staples.  The wound was covered with Adaptic orthopedic sponges, ABD dressing,  Webril and Coban.  The patient was extubated and taken to the PACU in  stable condition.      Nadara Mustard, MD  Electronically Signed     MVD/MEDQ  D:  01/18/2008  T:  01/18/2008  Job:  [id number]

## 2010-05-27 NOTE — Discharge Summary (Signed)
NAME:  [name], [name] NO.:  [id number]   MEDICAL RECORD NO.:  [id number]          PATIENT TYPE:  INP   LOCATION:  5037                         FACILITY:  [facility]   PHYSICIAN:  Nadara Mustard, MD     DATE OF BIRTH:  1931/02/07   DATE OF ADMISSION:  01/18/2008  DATE OF DISCHARGE:  01/24/2008                               DISCHARGE SUMMARY   FINAL DIAGNOSIS:  Osteoarthritis, left knee.   PROCEDURE:  Left total knee arthroplasty with DePuy components, #4  tibia, #4 femur, 10-mm poly tray with a 41-mm patella.  Discharge to  skilled nursing in stable condition.  Follow up with Dr. Lajoyce Corners in 2  weeks.  Physical therapy, progressive ambulation, and weightbearing as  tolerated with working on knee extension to the left knee.   Discharge medications include:  1. Coumadin 1 mg p.o. daily for 1 month.  No INR measurement is      necessary.  2. Colace 100 mg p.o. b.i.d.  3. Iron 324 mg p.o. b.i.d.  4. Sliding scale NovoLog insulin t.i.d.  5. CBG 101-150 two units, CBG 151-200 three units, CBG 201-250 five      units, CBG 251-300 seven units, CBG 301-350 nine units, and CBG 351-      400 eleven units.  6. Diltiazem 240 mg p.o. nightly.  7. Ocuvite 1 p.o. b.i.d.  8. Vitamin C 500 mg p.o. b.i.d.  9. Aspirin 81 mg p.o. daily.  10.Levothyroxine 100 mcg p.o. Wednesday.  11.Levothroid 150 mcg p.o. daily except Wednesdays.  12.Lanoxin 0.25 mg p.o. daily except Wednesdays.  13.Lanoxin 0.5 mg p.o. Wednesdays.  14.Vitamin D3 1000 units p.o. daily.  15.Edecrin 25 mg p.o. daily.  16.Lantus insulin 15 units subcu nightly.  17.Welchol 1250 mg p.o. t.i.d.  18.Prilosec 40 mg p.o. daily.  19.Coumadin 1 mg p.o. daily.  20.Laxative of choice and amount of choice.  21.Vicodin 1 p.o. q.4 h. p.r.n. for pain.  22.Benadryl 25 mg p.o. q.6 h. p.r.n. rash.   HISTORY OF PRESENT ILLNESS:  The patient is a 78 year old gentleman who  has osteoarthritis of the left knee presented at this time for  total  knee arthroplasty.  The patient's hospital course is essentially  unremarkable.  He underwent a left total knee arthroplasty on January 18, 2008.  He received Kefzol for infection prophylaxis and  Coumadin for DVT prophylaxis.  The patient progressed slowly and was not  felt to be safe for discharge to home and was discharged to skilled  nursing in stable condition on January 24, 2008, with physical therapy,  progressive ambulation, and knee extension exercises.  Follow up with  Dr. Lajoyce Corners in 2 weeks.      Nadara Mustard, MD  Electronically Signed     MVD/MEDQ  D:  01/24/2008  T:  01/24/2008  Job:  [id number]

## 2010-05-30 NOTE — Discharge Summary (Signed)
[facility]  Patient:    [name], [name] Visit Number: [id number] MRN: [id number]          Service Type: MED Location: 3W [phone] Attending Physician:  Dalbert Mayotte Dictated by:   Radene Knee., M.D. Admit Date:  12/01/2000 Discharge Date: 12/03/2000   CC:         Titus Dubin. Alwyn Ren, M.D. [facility]   Discharge Summary  HISTORY OF PRESENT ILLNESS:  This 77 year old male came in through the emergency room with severe pain in the left side, nausea, vomiting just before midnight on December 01, 2000.  He had been seen in the office earlier that day with a one-day history of pain in the left side and microscopic hematuria, and was being prepared for outpatient stone extraction.  Because of the severe nature of the pain and the nausea and vomiting, a CT scan was performed and revealed a stone obstructing the left ureter at the L5 level.  He is also known to have bilateral renal cysts.  These were unchanged on the CT scan. The patient was given IV Cipro, IV fluids, and on December 02, 2000, a left ureteroscopic stone extraction was carried out of a 4 mm calculus which had moved into the lower left ureter.  The patients ureter was stented with a #6 kwart-type double-J ureteral stent.  His Foley catheter was removed earlier on December 03, 2000.  The patient voided satisfactorily of grossly clear urine and was discharged to have his stent removed in the office on December 06, 2000.  The past history, review of systems, family history are all well documented in the H&P.  But, in summary, he is allergic to CODEINE, TETANUS, IVP DYE.  MEDICATIONS:  He is on Synthroid, Lanoxin, Cardizem CD, Glucotrol XL, Glucophage, aspirin, and glucosamine.  SOCIAL HISTORY:  He is married, has two children, retired, does not abuse alcohol or tobacco.  His wife is having some radiation therapy treatments.  FAMILY HISTORY:  Positive for heart disease, diabetes,  kidney stones, as well as stroke.  REVIEW OF SYSTEMS:  He does have headaches, coronary artery disease, had PAT in 1998.  He is not on Coumadin now.  He has arthritis and had a mild left-sided stroke with good recovery.  PHYSICAL EXAMINATION:  VITAL SIGNS:  Temperature 97.8, maximum in hospital of 100 and 98.6 at discharge.  Blood pressure 140/70, pulse 68, respirations 20.  GENERAL:  A well-developed 77 year old male.  HEENT:  Unremarkable except for dry tongue and fairly good teeth.  HEART/LUNGS:  Clear.  No murmur detected.  EKG showed mild ST wave changes.  ABDOMEN:  Revealed a little tenderness in the left flank, otherwise unremarkable.  He did have back scars, left inguinal scars, and right lower quadrant scars.  GENITOURINARY:  Penis circumcised.  External genitalia normal.  Scrotal, anus, perineum normal.  Prostate 20 g, firm, smooth, symmetrical, nontender.  EXTREMITIES/NEUROLOGIC:  Examinations unremarkable.  LABORATORY SUMMARY:  Glucose 234, creatinine 1.3, white count 13,300.  Urine culture:  No growth.  Other parameters were within normal limits.  His chest x-ray showed no active disease.  DIAGNOSES: 1. Left ureteral calculus with grade 1 hydronephrosis, L5 level. 2. Birenal cyst stable by ultrasound and CT scan 97 mm left, 55 mm right. 3. Benign prostatic hypertrophy and prostatitis.  Last prostate specific    antigen 3.6, September 2002. 4. Diabetes mellitus, onset 1998. 5. Coronary artery disease with history of arrhythmia in 1998. 6. Urinary tract infection,  febrile, July 2002. 7. History of stone extractions x 2 and bilateral renal calculi for some 35    years.  OPERATIONS:  Cystoscopy, left ureteroscopic stone extraction, retrograde pyelogram, and insertion of #6 kwart-type double J ureteral stent.  PLAN:  ADA, 1800-calorie diet.  Force fluids.  Return to office on Monday for stent removal.  He will take Cipro 500 b.i.d.  He has Darvocet and  [facility] if he needs anything for pain.  He is on Synthroid, Lanoxin, Cardizem, Glucotrol, Glucophage, glucosamine.  He will continue these medications, hold his aspirin.  CONDITION:  Improved. Dictated by:   Radene Knee., M.D. Attending Physician:  Dalbert Mayotte DD:  12/03/00 TD:  12/04/00 Job: [id number]

## 2010-05-30 NOTE — Assessment & Plan Note (Signed)
[facility]                               PULMONARY OFFICE NOTE   SHYLO, DILLENBECK                       MRN:          [id number]  DATE:09/03/2005                            DOB:          [birth year]    PROBLEM LIST:  1. Allergic rhinitis.  2. Asthmatic bronchitis.  3. History of chronic urticaria, responsive to cyclosporin.  4. Prostate cancer.   HISTORY:  His wife brings him in today complaining of 3 weeks of postnasal  drainage, which got significantly worse just in the last 2-3 days, rough.  He feels some retro-orbital pressure, some sense of congestion in the  sinuses and chest.  He is blowing out only clear mucus with no fever or sore  throat.  Benadryl has helped some.  There has been no sneezing or itching,  and no exposure to anybody with an infection.  He took some leftover Avelox  about three weeks ago.  Three days ago he took an antibiotic for a prostate  biopsy, and has now been diagnosed with prostate cancer.  He has refilled  the benzonatate Perles.   MEDICATIONS:  1. Insulin.  2. Edecrin 25 mg.  3. Digoxin.  4. Synthroid.  5. Diltiazem.  6. Aspirin 81 mg.  7. Welchol.   ALLERGIES:  Drug intolerant to TETANUS SHOT, CODEINE, IVP DYE.   OBJECTIVE:  VITAL SIGNS:  Weight 179 pounds, BP 142/72, pulse rate 65, room  air saturation 96%.  SKIN:  Looks clear, no obvious urticaria.  Adenopathy not found.  HEENT:  Mucoid postnasal drainage, moderate nasal congestion.  Conjunctivae  look clear, pharynx is not red.  CHEST:  Quiet.  He does a little throat clearing a couple of times, but no  real cough or wheeze.  CARDIAC:  Heart sounds are regular without murmur.   IMPRESSION:  Rhinitis and bronchitis, nonspecific.   PLAN:  1. Benzonatate Perles 100 mg q.6 h. p.r.n. cough.  2. Nasal Neo-Synephrine nebulizer treatment today.  3. Depo-Medrol 80 mg IM with a steroid talk.  4. Veramyst samples 1 spray each nostril daily.  5.  Schedule return with me in 2 months, but earlier p.r.n. failure to      clear.                                   Clinton D. Maple Hudson, MD, FCCP, FACP   CDY/MedQ  DD:  09/05/2005  DT:  09/06/2005  Job #:  [id number]   cc:   Titus Dubin. Alwyn Ren, MD, FACP, FCCP  Maretta Bees. Vonita Moss, MD  Artist Pais Kathrynn Running, MD

## 2010-05-30 NOTE — Op Note (Signed)
[facility]  Patient:    [name], [name] Visit Number: [id number] MRN: [id number]          Service Type: MED Location: 3W [phone] Attending Physician:  Dalbert Mayotte Dictated by:   Radene Knee., M.D. Proc. Date: 12/02/00 Admit Date:  12/01/2000 Discharge Date: 12/03/2000   CC:         [name]. Alwyn Ren, M.D. [facility]   Operative Report  PREOPERATIVE DIAGNOSES: 1. Left ureteral calculus with grade 1 hydronephrosis. 2. Bilateral renal cysts, left larger than right. 3. Diabetes. 4. Coronary artery disease, history of arrhythmia 1998. 5. Bronchitis. 6. Prostatitis and benign prostatic hypertrophy, prostate-specific antigen    normal. 7. Renal calculi dating back to 1967, bilateral with two previous extractions. 8. Urinary tract infection, July 2002.  POSTOPERATIVE DIAGNOSES: 1. Left ureteral calculus with grade 1 hydronephrosis. 2. Bilateral renal cysts, left larger than right. 3. Diabetes. 4. Coronary artery disease, history of arrhythmia 1998. 5. Bronchitis. 6. Prostatitis and benign prostatic hypertrophy, prostate-specific antigen    normal. 7. Renal calculi dating back to 1967, bilateral with two previous extractions. 8. Urinary tract infection, July 2002.  OPERATION PERFORMED:  Cystoscopy, left ureteroscopic stone extraction with retrograde pyelogram with insertion of #6 Kwort stent.  DESCRIPTION OF OPERATION:  This 77 year old male brought to the operating room, prepared for surgery with IV Cipro, underwent successful induction of general anesthesia and was prepped and draped in the lithotomy position. Using a #22 cystourethroscope, the bladder was inspected with 70 and 12 degree lenses, and no stone was noted in the bladder.  No tumor was noted in the bladder.  There was moderate hyperemia and erythema of the bladder, especially around the trigone and ureteral orifices, which otherwise appeared normal. There was anterior  notching, a little early median bar formation.  Neck to veru distance about 3 cm or less.  Using an .038 Glidewire through and open-ended catheter, the left ureter was intubated without difficulty under fluoroscopic control.  A retrograde pyelogram failed to demonstrate a stone, as had the preoperative KUB, but the preoperative CT had suggested a stone at the L5 level of the left.  The ureter was then dilated with a 10 cm x 15 mm Uromax dilating balloon for five minutes, and the 6.5 short ureteroscope was introduced with ease.  About 6-8 cm above the ureterovesical junction, a 4 mm calculus was seen and was grasped with a flat-wire basket and extracted on the first pass.  A second pass of the ureteroscope up to the L4 level revealed no additional stones.  A .038 spring guidewire was then introduced through the scope and over this guidewire was passed an open-ended catheter, and a retrograde pyelogram was performed, visualizing the kidney and the ureter.  No additional stones or lesions could be demonstrated.  The .038 Glidewire was then used to introduce a #6 Kwort type double-J ureteral stent which was noted on fluoroscopy to have a good curl in renal pelvis, and on cystoscopy it had a good curl in the bladder.  The pull-string was taped to the penis.  The bladder was drained with a #16 Foley catheter, and the patient returned to recovery area in a stable condition.  The plan is to treat the patient with Demerol and Phenergan for pain, Cipro 500 b.i.d. to prevent infection.  Will observe him overnight in view of his medical problems and the late hour.  Discharge him in the morning after removing his Foley catheter and  have him come to the office next week for removal of his ureteral stent. Dictated by:   Radene Knee., M.D. Attending Physician:  Dalbert Mayotte DD:  12/02/00 TD:  12/04/00 Job: [id number]

## 2010-05-30 NOTE — Op Note (Signed)
NAME:  [name], [name] NO.:  [id number]   MEDICAL RECORD NO.:  [id number]          PATIENT TYPE:  AMB   LOCATION:  ENDO                         FACILITY:  [facility]   PHYSICIAN:  Petra Kuba, M.D.    DATE OF BIRTH:  09-26-1931   DATE OF PROCEDURE:  10/17/2003  DATE OF DISCHARGE:                                 OPERATIVE REPORT   PROCEDURE:  Colonoscopy.   ENDOSCOPIST:  Petra Kuba, M.D.   ANESTHESIA:  Demerol 50, Versed 6.   HISTORY:  Patient with difficult to remove, moderate sized polyps.  Want to  recheck to make sure all removed.  Consent was signed after risks and  benefits, methods, options were thoroughly discussed in the office in the  past.   DESCRIPTION OF PROCEDURE:  Rectal inspection was noted for external  hemorrhoids.  Digital exam was negative except for an obviously enlarged  prostate.  Video pediatric adjustable colonoscope was inserted and despite  some tortuosity spasm,  and probably only a fair prep, was able to be advanced to the cecum.  No abnormality was seen.  No position changes or abdominal pressure was  needed. The cecum was identified by the appendiceal orifice and the  ileocecal valve.  Over a L of washing and suctioning was done for fairly  adequate visualization.   In the ascending colon, 2 small, probable residual polyps were seen, to  which snare electrocautery applied, suctioned through the scope and  collected in the trap. Scope was further withdrawn.  No other transverse  abnormality was seen, until we withdrew back through the splenic flexure  where a small, probable residual polyp was seen, snare electrocautery  applied.  It was suctioned through the scope, collected in the trap and put  in the second container. Three other descending small polyps were seen and  were hot biopsied x1 and put in that second container.  Other than some rare  right and occasional left diverticula, no other abnormalities were seen.  With  the prep and some increased spasm, lesions could have been missed, but  none other were seen.  Anorectal pull-through on retroflexion confirmed the  hemorrhoids and also revealed the enlarged prostate. Scope was straightened  readvanced towards the left side of the colon. Air was suctioned, scope was  removed.  The patient tolerated the procedure well. There were no obvious  immediate complications.   ENDOSCOPIC DIAGNOSES:  1.  Internal/external hemorrhoids.  2.  Enlarged prostate.  3.  Left greater than right rare diverticula.  4.  Two ascending, small probable residual polyps there.  5.  Splenic flexure probable residual polyps there.  6.  Three descending, small polyps hot biopsied.  7.  Otherwise within normal limits to the cecum.   PLAN:  Await pathology to determine future colonic screening.  Based on the  number of polyps in the past, prep, spasm, etc., probably repeat a little  sooner than might otherwise.  Happy to see him back p.r.n., otherwise return  care to Dr. Rosita Fire and Dr. Elige Radon to continue work-up for his kidneys and  enlarged prostate.       MEM/MEDQ  D:  10/17/2003  T:  10/17/2003  Job:  [id number]   cc:   Nobie Putnam, M.D.   Retia Passe, M.D.

## 2010-05-30 NOTE — Assessment & Plan Note (Signed)
[facility]                        [facility] OFFICE NOTE   [name], [name]                       MRN:          [id number]  DATE:04/27/2006                            DOB:          02-14-1931    [name], date of birth Sep 14, 1931 was seen April 27, 2006  for complaints of weight loss and decreased energy.  This is associated  with a generalized malaise.   He has had a past history of weight loss prior to a hyper functioning  thyroid nodule being removed in 1985.  He also has a history of  paroxysmal atrial fibrillation.   In 2005 colonoscopy revealed polyps.  Dr. Ewing Schlein has contacted him that  he is due for followup in the near future and this is to be scheduled.  He denies abdominal pain, melena, or rectal bleeding.  He denies any  change in his hair or skin.  He does have some cold intolerance.   He questions whether the Synthroid he is taking at bedtime is  interacting with Welchol which he takes 2-3 times a day.resulting in  inadequate correction of his hypopthyroidism.   OTHER MEDICATIONS:  1. Digitek 250 mcg daily.  2. Cartia XT.  3. edecrin 25 mg daily.  4. Lantus basal insulin.  5. Humulin R based on the sugars.   He denies excessive thirst, hunger, urination at this time.   He is followed by Dr. Larey Dresser because of history of prostate  cancer treated  with radiation.   On February 26, 2006 his A1c was 5.2 indicating excellent diabetic  control.  At that time his TSH was 14.35 indicating suboptimal thyroid  replacement.  His thyroid was increased to 0.05 four daily.  He is on  this dose as this does not have pigment; he has had profound urticaria  with various pigments in medications in the past.  He had an extensive  evaluation and therapeutic course by Dr. Rosita Fire in the Department of  Allergy at [facility].   His weight indeed is down almost 13 pounds to 166.4.  Pulse was 80,  respiratory  rate is 15, blood pressure 128/78.  He has no  lymphadenopathy about the head, neck, or axilla.  He has no icterus or  jaundice.  The thyroid is surgically absent on the left; no nodules are  palpable on the right.  He has a grade 1 systolic murmur; rhythm is  regular.  He has no organomegaly or masses.  He does have subcutaneous  lipomata which are most likely related to insulin injections.   TSH , stool cards,CBC and differential will  be collected.  If there is  significant anemia or stool cards are positive performance of the  colonoscopy should be extradited.     Titus Dubin. Alwyn Ren, MD,FACP,FCCP  Electronically Signed    [facility]/MedQ  DD: 04/27/2006  DT: 04/27/2006  Job #: [id number]   cc:   Petra Kuba, M.D.

## 2010-05-30 NOTE — Assessment & Plan Note (Signed)
[facility]                               PULMONARY OFFICE NOTE   MATIN, MATTIOLI                       MRN:          [id number]  DATE:11/04/2005                            DOB:          [birth year]    PROBLEM:  1. Allergic rhinitis.  2. Asthmatic bronchitis.  3. History of chronic urticaria, responsive to cyclosporin.  4. Prostate cancer.  5. Diabetes.   HISTORY:  He returns for followup wanting flu shot and complaining of  persistent nasal drip. When he was here in late August I had given a sample  of Veramyst. He did not try that and he has not taken an antibiotic. He  avoided Veramyst because he heard it might get into his eyes and he did not  think that was a good idea. He is undergoing radiation therapy.   MEDICATIONS:  1. Insulin.  2. Edecrin 25 mg.  3. Digoxin one-half x 0.25 mg.  4. Synthroid.  5. Diltiazem.  6. Aspirin 81 mg.  7. Welchol.   DRUG INTOLERANCES:  TETANUS, CODEINE AND IVP DYE.   OBJECTIVE:  Weight: 173 pounds.  Blood pressure: 128/64.  Pulse: 76.  Room  air saturation: 97%.  Wet nose. No cough while here. No visible post-nasal drainage. Quiet chest.  No rash.   IMPRESSION:  Allergic rhinitis.   PLAN:  1. Try Astelin one spray each nostril up to b.i.d. p.r.n.  2. Flu shot.  3. Schedule return three months, but earlier p.r.n.       Clinton D. Maple Hudson, MD, FCCP, FACP      CDY/MedQ  DD:  11/04/2005  DT:  11/05/2005  Job #:  [id number]   cc:   Titus Dubin. Alwyn Ren, MD,FACP,FCCP  Maretta Bees. Vonita Moss, M.D.  Artist Pais Kathrynn Running, M.D.

## 2010-05-30 NOTE — Op Note (Signed)
NAME:  [name], PULIS NO.:  [id number]   MEDICAL RECORD NO.:  [id number]                   PATIENT TYPE:  AMB   LOCATION:  ENDO                                 FACILITY:  [facility]   PHYSICIAN:  Petra Kuba, M.D.                 DATE OF BIRTH:  Jan 17, 1931   DATE OF PROCEDURE:  07/11/2003  DATE OF DISCHARGE:                                 OPERATIVE REPORT   __________.   PROCEDURE:  Colonoscopy with multiple polypectomies.   INDICATIONS:  Screening.  Abdominal pain resolved.  Family history of colon  cancer.   PREMEDICATIONS:  Demerol 90 mg, Versed 9 mg.   DESCRIPTION OF PROCEDURE:  Consent was signed after risks, benefits,  methods, options thoroughly discussed in the office.  Rectal inspection  pertinent for external hemorrhoids.  Digital exam was negative.  Video  pediatric adjustable colonoscope was inserted and fairly easily, despite  some tortuosity, advanced around the colon to the cecum.  On insertion  multiple polyps were seen.  Two large ones, one in the ascending and one in  the proximal of the splenic flexure were the most impressive.  A few left-  sided diverticula were seen but no other abnormalities.  The cecum was  identified by the appendiceal orifice and the ileocecal valve.  We went  ahead and removed the big ascending polyp which required five snares.  There  were another pedunculated polyp just below that.  Based on the sessile  nature of this polyp, it was difficult to know whether we removed it  completely.  We did use the [facility] retrieval basket to get the majority of the  pieces in the net.  The endoscope was removed.  We reinserted the scope to  the cecum.  There was no active bleeding and we went ahead and removed three  other ascending small polyps using the snare in the ascending.  There was  one small cecal polyp which we hot biopsied.  All ascending polyps were put  in same container with the large polyp.  At that point  there was increased  spasm but no obvious complications and no obvious residual polyps.  So we  slowly withdrew.  We advanced into the cecum which was done a total of four  times.  Did not require abdominal pressure on the additional insertions.  The scope was then slowly withdrawn back to the transverse.  Two small  polyps were seen on withdrawal which were snared, electrocautery applied and  suctioned through the scope and collected in the trap.  A few of the small  polyps were cut in half using the snare and again suctioned through the  trap.  They were put in the second container.  We then found the large  splenic flexure polyp along the folds which required five or six snares as  well to remove it.  Possibly there was  a little residual polypoid tissue  remaining but based on the degree of burn, we elected to not to remove any  further.  Again the [employer] was used to remove some of the  bigger pieces and they were all put in the second container.  We then  readvanced to the cecum, again and some of the pieces in the descending that  were residual were suctioned and put in the first container.  On slow  withdrawal, a few other pieces were suctioned and put in the second  container when they were from the transverse.  Three other transverse and  proximal descending polyps were seen and were all snared and removed as  above and put in the second container.  The scope was further withdrawn.  There were some left-sided diverticula.  In the rectum and distal sigmoid  were three small polyps, all of which were snared, electrocautery applied,  suctioned through the scope, and collected in the trap.  These were put in  the third container.  The scope was then reinserted back to the cecum.  On  insertion, again some left-sided descending or transverse polyps were seen  and they were removed as above, put in the second container and again in the  ascending, two additional polyps were  seen and snared.  They were cut in  half and suctioned into the trap and put in the first container.  We did try  hard to keep the different pieces from the different sections of the colon  separate.  No active bleeding was seen.  The [employer] was advanced one  more time to remove another piece which was left in the ascending colon.  Air was suctioned as we removed the scope.  The patient tolerated the  procedure well.  There was no obvious immediate complications.   ENDOSCOPIC DIAGNOSES:  1. Internal and external hemorrhoids.  2. Occasional left-sided diverticula.  3. Multiple polyps removed; three in the distal sigmoid and rectum which     were small and all snared, two large ones in the splenic flexure and in     the ascending colon requiring five or six snares, multiple other polyps     in the transverse, descending, ascending, requiring multiple snares, and     in the cecum another small polyp.  4. Otherwise within normal limits.   PLAN:  Await pathology to determine future colonic screening, and also  decide possible surgical options versus repeat colonoscopy in the near  future.  We will put him on the two-week post polypectomy instructions.                                               Petra Kuba, M.D.    MEM/MEDQ  D:  07/11/2003  T:  07/11/2003  Job:  [id number]

## 2010-05-30 NOTE — Discharge Summary (Signed)
NAME:  [name], [name] NO.:  [id number]   MEDICAL RECORD NO.:  [id number]          PATIENT TYPE:  INP   LOCATION:  5004                         FACILITY:  [facility]   PHYSICIAN:  Nadara Mustard, MD     DATE OF BIRTH:  09-28-1931   DATE OF ADMISSION:  01/21/2006  DATE OF DISCHARGE:  01/25/2006                               DISCHARGE SUMMARY   FINAL DIAGNOSIS:  Osteoarthritis right knee.   PROCEDURE:  Right total knee arthroplasty.   DISPOSITION:  Discharged to home in stable condition.  Follow-up in the  office and 2 weeks.   HISTORY OF PRESENT ILLNESS:  The patient is a 77 year old gentleman with  chronic osteoarthritis both knees, right worse than left.  He has failed  conservative care including arthroscopy, steroid injections, as well as  anti-inflammatories and presents at this time for total knee  arthroplasty.   HOSPITAL COURSE:  The patient's hospital course was essentially  unremarkable.  He underwent a right total knee arthroplasty on January 21, 2006, with DePuy components, a #4 femur, #4 tibia, 10 mm poly tray,  and a 41 mm patella.  The patient received Kefzol for infection  prophylaxis.  Tourniquet time 47 minutes at 300 mmHg, and he was started  on Coumadin for DVT prophylaxis.   Postoperative day #1, the patient's hemoglobin was stable at 14.3.  Hemoglobin A1c was 5.8.  He was started on physical therapy for  progressive ambulation, weightbearing as tolerated.  The patient  progressed well with therapy.  He was set up for home health physical  therapy with skilled nursing and home health PT and discharged to home  in stable condition on January 25, 2006.      Nadara Mustard, MD  Electronically Signed     MVD/MEDQ  D:  03/07/2006  T:  03/07/2006  Job:  [id number]

## 2010-05-30 NOTE — H&P (Signed)
[facility]  Patient:    [name] Visit Number: [id number] MRN: [id number]          Service Type: EMS Location: ED Attending Physician:  Corlis Leak. Dictated by:   Radene Knee., M.D. Proc. Date: 12/01/00 Admit Date:  12/01/2000                           History and Physical  CHIEF COMPLAINT:  This patient, age 77, was seen in the office December 01, 2000, with one-day history of pain in the left side and microscopic hematuria. An ultrasound suggested a grade 1.5 hydronephrosis on the left.  For that reason he was recommended for CT scan and possible stone extraction if needed.  HISTORY OF PRESENT ILLNESS:  This 77 year old male, diabetic, has been followed in our office since 1995 with BPH, prostatitis, and renal cyst.  He has a history of stones dating back some 25-35 years and at least two stone extractions which he thinks were on the left.  The patient has been voiding well and he gets up once or twice a night and voids every three hours.  He has not seen any gross blood, gravel, or stone, but we did see microscopic hematuria in the office.  His last PSA was done in September 2002 and was 3.69.  The patient was advised that if the [facility] and Darvocet that he had orally did not relieve his pain, he is to go to the emergency room and will do a CT scan and consider stone extraction.  PAST MEDICAL HISTORY:  Significant in that was treated in July 2002 for urinary tract infection.  He has had diabetes, bilateral renal cyst, as well as prostatitis and BPH dating to 74.  Diabetes, onset 26.  He also has had coronary artery disease with paroxysmal ventricular tachycardia in 1998.  He has had erectile dysfunction.  ALLERGIES:  CODEINE, TETANUS, and IVP DYE.  CURRENT MEDICATIONS: 1. Synthroid 0.15 mg q.d. 2. Lanoxin 0.25 mg q.d. 3. Cardizem CD 240 mg q.d. 4. Glucotrol XL 2 q.d. 5. Glucophage 1.5 in the morning and  1.5 in p.m. 6. Aspirin 1 q.d. 7. Glucosamine 500 mcg q.d.  PAST SURGICAL HISTORY:  T&A, lung surgery age 11, back surgery 1991, fractured back in 1970, vasectomy, and stone extractions on two occasions some 30-35 years ago.  SOCIAL HISTORY:  The patient does not abuse alcohol or tobacco.  He is retired, is married, and has two children.  His wife has been having radiation treatments for cancer in the salivary glands.  FAMILY HISTORY:  Positive for heart disease, diabetes, and kidney stones. Father died of a heart attack at age 53, mother died of stroke in her 14s.  No other familial diseases to his knowledge.  REVIEW OF SYSTEMS:  GENERAL:  No weight loss.  HEENT:  Unremarkable, except for headaches and febrile episode he had in July.  CARDIORESPIRATORY:  No recent chest pain.  He does have a history of coronary artery disease, PAT, and required Coumadin in 1998, but not on Coumadin at the present time. GASTROINTESTINAL:  He denies any peptic ulcer disease or hepatitis.  Bowels have been normal.  BONES/JOINTS/MUSCLES:  He does have arthritis, moderate to severe, with pains in his legs and knees.  NEUROPSYCHIATRIC:  He may have had a mild left-sided stroke with good recovery in the past. LYMPHATIC/HEMATOPOIETIC:  No history of anemia or nodes enlarged.  SKIN:  No skin lesions noted.  PHYSICAL EXAMINATION:  VITAL SIGNS:  Temperature 97.8, pulse 68, respirations 20, blood pressure 140/70.  GENERAL:  Well-developed, well-nourished, 77 year old male.  HEENT:  The ears and tympanic membranes are unremarkable.  Eyes react normally to light and accommodation.  Extraocular movements are intact.  Pharynx benign and teeth in fair condition.  Tongue dry.  NECK:  No enlargement of thyroid and no nodes palpable.  LUNGS:  Clear to auscultation and percussion.  HEART:  Normal sinus rhythm without murmur detected.  ABDOMEN:  Abdomen, liver, kidney, spleen, masses, and hernia not  detected. There is just a little tenderness in the left flank.  Abdomen is flat.  GENITALIA:  He has back scars, left inguinal scars, and right lower quadrant scars.  The meatus and external genitalia are normal.  Penis is circumcised. Testes are good size and symmetrical.  Anus, perineum, and scrotum normal. Rectal tone good.  Prostate 20 grams, firm, smooth, symmetrical, and nontender.  EXTREMITIES:  There are some decreased pedal pulses and no edema.  NEUROLOGIC:  Grossly normal reflexes and sensation.  DIAGNOSES: 1. Left ureteral calculus with grade 1.5 hydronephrosis. 2. Bilateral renal cyst, stable by ultrasound, 97 mm left, 55 mm right. 3. Prostatitis and benign prostatic hypertrophy dating to 1995.  Last    prostate-specific antigen 3.6, September 2002. 4. Diabetes mellitus, onset 1998. 5. Coronary artery disease with paroxysmal ventricular tachycardia, 1998. 6. Erectile dysfunction. 7. Urinary tract infection with fever, July 2002. 8. History of stone extractions x 2 some 30-35 years ago.  PLAN:  Have the patient go to the emergency room, have CT scan.  If he looks like he cannot pass his stone, will admit him and proceed with stone extraction. Dictated by:   Radene Knee., M.D. Attending Physician:  Corlis Leak DD:  12/01/00 TD:  12/01/00 Job: [id number]

## 2010-05-30 NOTE — Consult Note (Signed)
[facility]  Patient:    [name], [name]                       MRN: [id number] Proc. Date: 08/11/00 Adm. Date:  [id number] Attending:  Dolores Patty CC:         Titus Dubin. Alwyn Ren, M.D. [facility]   Consultation Report  This patient, age 77, is admitted with the chief complaint of dysuria, chills, and fever of 102 to Dr. Caryl Never service, and is seen in consultation because of his pyuria and dysuria.  HISTORY OF PRESENT ILLNESS:  This 77 year old male diabetic has been followed in our office since 1995 with benign prostatic hypertrophy, prostatitis, and renal cyst.  He was doing well when he was seen in February 2002, and his last PSA report at that time was 2.1.  He came to our office on August 11, 2000, reported that for past two days he had increasingly severe dysuria, urgency, frequency, sometimes voiding every 30 to 60 minutes, getting up every hour during the night, and had a temperature.  The temperature spiked to 102.  He had headache and aching all over.  He had not passed any blood, gravel, or stones.  In our office he had pyuria.  He had no significant residual urine by ultrasound, no hydronephrosis by ultrasound.  His bilateral renal cyst were unchanged, large on the right, and to a lesser degree on the left.  He had an IVP in March 2001, at which time the creatinine was 1.1, and he had a 3 mm calcification in the left kidney.  It was not clear whether this was a stone or a calcification in the cyst wall, but there was no obstruction, and the IVP did confirm the bilateral cysts noted on ultrasound.  In view of the patients chills, fever of 102, nausea, it was felt he should be admitted for IV fluids, IV antibiotics on Dr. Caryl Never service because of his medical problems, and we will see him in consultation.  ALLERGIES:  CODEINE, TETANUS, IVP DYE.  PRESENT MEDICATIONS: 1. Synthroid 0.15 mg q.d. 2. Lanoxin 0.25 mg q.d. 3. Cardizem CD 240 mg  q.d. 4. Glucotrol XL two q.d. 5. Glucophage. 6. Aspirin. 7. Glucosamine.  PAST MEDICAL HISTORY: 1. Diabetes. 2. Coronary artery disease. 3. Erectile dysfunction. 4. Prostatitis. 5. Benign prostatic hypertrophy. 6. Renal cysts.  PAST SURGICAL HISTORY: 1. Tonsillectomy and adenoidectomy. 2. He had lung surgery at age 27.  It sounds like an empyema was drained. 3. Back surgery in 1991. 4. History of a fractured back in 1970. 5. Vasectomy. 6. Stones which he thinks were extracted on two occasions, some 25 years ago.  SOCIAL HISTORY:  The patient does not abuse alcohol or tobacco.  He is retired.  He is married.  He has two children.  FAMILY HISTORY:  Positive for heart disease, diabetes, kidney stones.  Father died of a heart attack at age 15.  Mother died of a stroke in her 87s.  No other familial diseases to his knowledge.  REVIEW OF SYSTEMS:  He has had generally no weight loss.  HEENT:  He has had headaches and fever.  CARDIOVASCULAR:  He denies any recent chest pain.  He does have a history of coronary artery disease.  PAT, required Coumadin back in 1998.  GASTROINTESTINAL:  He denies any peptic ulcer disease, hepatitis, his bowels have been normal.  BONES, JOINTS, AND MUSCLES:  He does have arthritis, moderately severe, with especially  pains in his legs and knees.  NEUROPSYCHIATRIC:  May of had a left sided stroke with good recovery in the past.  LYMPHATICS/HEMATOLOGIC:  No history of anemia.  SKIN:  No new skin lesions are noted.  PHYSICAL EXAMINATION:  GENERAL:  An acutely ill male, age 58.  VITAL SIGNS:  Temperature 102, pulse 96, respiratory rate 16, blood pressure 118/66.  HEENT:  The ears and tympanic membranes are unremarkable.  Eyes react normally to light and accommodation.  Extraocular movements are intact.  Pharynx is benign.  Teeth are in fair condition.  Tongue is dry.  NECK:  No enlargement of nodes.  No thyroid felt.  LUNGS:  Clear to  auscultation and percussion.  HEART:  Normal sinus rhythm with no murmur detected.  ABDOMEN:  Liver, kidneys, spleen no masses, hernias, or tenderness not detected.  Abdomen is flat.  GENITALIA:  Meatus is normal.  Penis circumcised.  Testes are good size, symmetrical, nontender.  Scrotum, anus, and perineum are normal.  Rectal tone good.  Prostate is smooth, firm, symmetrical, 20 to 25 g, maybe slightly tender.  EXTREMITIES:  Decreased pedal pulses.  No edema.  NEUROLOGIC:  Grossly normal reflexes and sensation.  IMPRESSION: 1. Urinary tract infection with fever, rule out urosepsis. 2. Bilateral renal cysts, left greater than right, stable. 3. Prostatitis and benign prostatic hypertrophy, in 1995. 4. Diabetes mellitus, onset 1998. 5. Coronary artery disease with paroxysmal ventricular tachycardia in 1998. 6. Erectile dysfunction. 7. History of renal calculi some 25 years ago with extractions x 2.  PLAN:  To admit the patient to Dr. Caryl Never service.  We will see him in consultation.  We will culture his blood cultures, urine, get routine laboratory investigations, chest x-ray, EKG, KUB, and start IV Cipro and IV fluids. DD:  08/11/00 TD:  08/11/00 Job: [id number]

## 2010-05-30 NOTE — Op Note (Signed)
NAME:  KAIN, MILOSEVIC NO.:  [id number]   MEDICAL RECORD NO.:  [id number]          PATIENT TYPE:  INP   LOCATION:  2550                         FACILITY:  [facility]   PHYSICIAN:  Nadara Mustard, MD     DATE OF BIRTH:  April 14, 1931   DATE OF PROCEDURE:  01/21/2006  DATE OF DISCHARGE:                               OPERATIVE REPORT   PREOPERATIVE DIAGNOSIS:  Osteoarthritis, right knee.   POSTOPERATIVE DIAGNOSIS:  Osteoarthritis, right knee.   PROCEDURE:  Right total knee arthroplasty with a #4 femur, #4 tibia, 10-  mm poly tray posterior stabilized with a 41-mm patella.   SURGEON:  Nadara Mustard, MD   ANESTHESIA:  General.   ESTIMATED BLOOD LOSS:  Minimal.   ANTIBIOTICS:  1 gram of Kefzol.   DRAINS:  None.   COMPLICATIONS:  None.   TOURNIQUET TIME:  47 minutes at 300 mmHg at the thigh.   DISPOSITION:  To PACU in stable condition.   INDICATIONS FOR PROCEDURE:  The patient is a 77 year old gentleman with  osteoarthritis of his right knee, has failed conservative care.  He has  pain with activities of daily living and wishes to proceed with total  knee arthroplasty.  Risks and benefits were discussed including  infection, neurovascular injury, persistent pain, DVT, need for  additional surgery.  The patient states he understands and wished  proceed at this time.   DESCRIPTION OF PROCEDURE:  The patient was brought to OR room 1 and  underwent general anesthetic.  After adequate level of anesthesia  obtained, the patient's right lower extremity was prepped using  DuraPrep, draped into a sterile field.  Collier Flowers was used to cover all  exposed skin.  A medial incision was made over the patella.  This was  carried down to the medial parapatellar retinacular incision.  The  patella was everted.  The drill was used to start the femoral guide  hole.  The IM guide was used and this was set for 5 degrees of valgus  and 11 mm was taken off the distal femur.  The distal  femoral cutting  block was placed and an 11 mm cut was made.  The femur was then sized  for size 4.  The chamfer block was placed and the chamfer cuts were made  for a size 4 femur.  Attention was then focused on the tibia.  The tibia  was set to take 6 mm off the medial tibial plateau.  This was set for  neutral varus valgus and neutral posterior slope with the external  alignment jig.  The tibial cut was made and the tibia was then sized for  a size 4.  The size 4 trial was placed and the punch hole was made for  the keel and the keel was placed.  Attention was then focused on the  femur.  The box cut was made on the femur with a box cut guide.  The #4  femur was placed peg holes were drilled and the 10 mm trial was placed.  The knee was placed through full  range of motion.  The patient had full  extension and full flexion.  The trial components removed.  The knee was  irrigated with pulse lavage.  The patella was resurfaced and 10 mm was  taken off the patella.  This was sized for 41 and the peg hole cuts were  made for the size 41.  Again the knee was irrigated with pulse lavage.  The tibia and then femoral component were cemented in place.  Loose  cement were was removed.  The knee was again irrigated with pulse  lavage.  The tibial tray was placed.  The knee was kept in extension and  the patella was then cemented in place with the clamp in place, the knee  in extension until the cement hardened.  The knee was irrigated with  pulse lavage at this time.  The clamp was then removed the knee was  placed through full range of motion.  There was central tracking of  patella without touching of the patella.  The tourniquet was deflated  after 47 minutes.  Hemostasis was obtained.  The retinacula was closed  using #1 Vicryl.  Subcu was closed using 2-0 Vicryl.  Skin was closed  using Proximate staples.  The wound was covered with Adaptic orthopedic  sponges, sterile ABD, Webril and a Coban  dressing.  The patient was then  extubated, taken to PACU in stable condition.      Nadara Mustard, MD  Electronically Signed     MVD/MEDQ  D:  01/21/2006  T:  01/21/2006  Job:  [id number]

## 2010-05-30 NOTE — Assessment & Plan Note (Signed)
[facility]                                 ON-CALL NOTE   AVYN, COATE                         MRN:          [id number]  DATE:04/10/2006                            DOB:          07/11/1931    NARRATIVE NOTE.   This is a patient of Dr. Maple Hudson who has a history of bronchitis.  I was  contacted by his wife over the weekend complaining of a 1-day history of  nasal congestion, minimally productive cough and sneezing.  Patient  denies any chest pain, hemoptysis, orthopnea, PND or leg swelling.  Patient was recommended to use over-the-counter products including  Mucinex-DM, Claritin for respiratory symptoms.  Advised if symptoms  worsen he is to go to the Urgent Care or Emergency Room for evaluation  and treatment, and then to contact our office early next week for  followup if symptoms continue to be present.      Rubye Oaks, NP  Electronically Signed      Clinton D. Maple Hudson, MD, Tonny Bollman, FACP  Electronically Signed   TP/MedQ  DD: 04/10/2006  DT: 04/10/2006  Job #: [id number]

## 2010-05-30 NOTE — Assessment & Plan Note (Signed)
[facility]                             PULMONARY OFFICE NOTE   ANSAR, SKODA                       MRN:          [id number]  DATE:04/20/2006                            DOB:          Sep 22, 1931    PROBLEM:  1. Asthmatic bronchitis.  2. Allergic rhinitis.  3. History of chronic urticaria responsive to cyclosporin.  4. Prostate cancer.  5. Diabetes.   HISTORY:  Hard sneezing blamed on pollen.  He is trying to stay in.  Tussive pain under the left costal margin happened during a hard sneeze.  Watery rhinorrhea, despite Benadryl.  Since I last saw him, he had had a  knee replacement, no recurrence of hives, prostate cancer treated with  radiation therapy.   MEDICATIONS:  1. Insulin.  2. Edecrin 25 mg.  3. Digoxin 0.25 mg x1/2.  4. Synthroid.  5. Diltiazem.  6. Baby aspirin.  7. Welchol.   DRUG INTOLERANCES:  TETANUS, CODEINE, IVP DYE.   OBJECTIVE:  Weight 168 pounds.  BP 110/66, pulse regular at 80, room air  saturation 98%.  Breath sounds are equal with minimal tenderness to pressure at the left  anterior costal margin, where he indicates tussive pain.  I do not hear  a rub.  There is moderate nasal congestion, no visible postnasal drip.   IMPRESSION:  1. Rhinitis.  2. Rhinosinusitis.  3. Musculoskeletal pain.   PLAN:  1. Nasal inhalation with Neo-Synephrine.  2. Depo-Medrol 80 mg IM with steroid talk, requested by patient.  3. Ceftin 500 mg b.i.d., 7 days.  4. Mucinex p.r.n.  5. Scheduled return in 1 year, but earlier p.r.n.     Clinton D. Maple Hudson, MD, Tonny Bollman, FACP  Electronically Signed    CDY/MedQ  DD: 04/20/2006  DT: 04/21/2006  Job #: [id number]   cc:   Titus Dubin. Alwyn Ren, MD,FACP,FCCP

## 2010-06-08 ENCOUNTER — Other Ambulatory Visit: Payer: Self-pay | Admitting: Internal Medicine

## 2010-07-13 ENCOUNTER — Other Ambulatory Visit: Payer: Self-pay | Admitting: Internal Medicine

## 2010-07-14 ENCOUNTER — Other Ambulatory Visit: Payer: Self-pay | Admitting: Internal Medicine

## 2010-07-14 DIAGNOSIS — E119 Type 2 diabetes mellitus without complications: Secondary | ICD-10-CM

## 2010-07-15 ENCOUNTER — Other Ambulatory Visit (INDEPENDENT_AMBULATORY_CARE_PROVIDER_SITE_OTHER): Payer: Medicare Other

## 2010-07-15 DIAGNOSIS — E119 Type 2 diabetes mellitus without complications: Secondary | ICD-10-CM

## 2010-07-15 LAB — HEMOGLOBIN A1C: Hgb A1c MFr Bld: 7.3 % — ABNORMAL HIGH (ref 4.6–6.5)

## 2010-07-15 NOTE — Progress Notes (Signed)
Labs only

## 2010-07-18 ENCOUNTER — Other Ambulatory Visit: Payer: Self-pay | Admitting: Internal Medicine

## 2010-07-18 MED ORDER — FUROSEMIDE 20 MG PO TABS: 20.0000 mg | ORAL_TABLET | Freq: Every day | ORAL | Status: DC

## 2010-07-18 NOTE — Telephone Encounter (Signed)
RX sent to pharmacy  

## 2010-07-22 ENCOUNTER — Ambulatory Visit: Payer: Medicare Other | Admitting: Internal Medicine

## 2010-07-22 ENCOUNTER — Ambulatory Visit (INDEPENDENT_AMBULATORY_CARE_PROVIDER_SITE_OTHER): Payer: Medicare Other | Admitting: Internal Medicine

## 2010-07-22 ENCOUNTER — Encounter: Payer: Self-pay | Admitting: Internal Medicine

## 2010-07-22 DIAGNOSIS — E119 Type 2 diabetes mellitus without complications: Secondary | ICD-10-CM

## 2010-07-22 NOTE — Patient Instructions (Addendum)
Consider the low carb nutrition program in [employer]  to prevent Diabetes progression & complications.  Read all labels ; consume LESS than 40  grams of sugar / day from foods & drinks with  High Fructose Corn Syrup as #1, 2 , 3 or # 4 on label.Note : dividing the "grams of sugar" on label by 4 gives "teaspoons of sugar" content of food or drink. For example a 22 oz Coke has 68 grams of sugar or 17 tsp of sugar.Check A1c in 4 months.

## 2010-07-22 NOTE — Progress Notes (Signed)
Subjective:    Patient ID: [name], male    DOB: 1932-01-02, 77 y.o.   MRN: [id number]  HPI Diabetes status assessment: Fasting or morning glucose range:  98-135 or average :  ?  . Highest glucose 2 hours after any meal:  < 170. Hypoglycemia :  no .                                                     Excess thirst :no;  Excess hunger:  no ;  Excess urination:  no.                                  Lightheadedness with standing:  No. Neurologist @ [facility] diagnosed inner ear. Chest pain:  no ; Palpitations :no ;  Pain in  calves with walking:  no .                                                                                                                                 Non healing skin  ulcers or sores,especially over the feet:  no. Numbness or tingling or burning in feet : no .                                                                                                                                              Significant change in  Weight : up 5#. Vision changes : no  .                                                                    Exercise : machines & walking @ [employer] . Nutrition/diet:  Decreased white carbs. Medication compliance : yes. Medication adverse  Effects:  no . Eye exam : injection for Macular Degeneration last week @ [facility]. Foot care : 6 weeks ago for ingrown  toenail.  A1c/ urine microalbumin monitor:  7.3 % (6.9% in 03/12)            Review of Systems     Objective:   Physical Exam  Gen.: Healthy and well-nourished in appearance. Alert, appropriate and cooperative throughout exam. Eyes: No corneal or conjunctival inflammation noted. Lungs: Normal respiratory effort; chest expands symmetrically. Lungs are clear to auscultation without rales, wheezes, or increased work of breathing. Heart: Normal rate and rhythm. Normal S1 and S2. No gallop, click, or rub. Grade 1/6 systolic  murmur.                                       No clubbing, cyanosis, edema. Minor OA DIP  deformities noted.Nail health  good. Vascular: Carotid, radial artery, dorsalis pedis and  posterior tibial pulses are full and equal. No bruits present. Neurologic: Alert and oriented x3.         Skin: Intact without suspicious lesions or rashes. Psych: Mood and affect are normal. Normally interactive                                                                                    Assessment & Plan:  #1 diabetes; A1c has risen slightly but this is still acceptable range based on his age and other health issues.  Plan: No change in the present medications. The A1c should be rechecked in 4 months.  I have discussed him seeing Dr. Merlene Laughter, the attending physician for [facility]. I have known Dr. Dava Najjar for decades and have the utmost respects for his professional skills.

## 2010-07-24 ENCOUNTER — Other Ambulatory Visit: Payer: Medicare Other

## 2010-07-31 ENCOUNTER — Ambulatory Visit: Payer: Medicare Other | Admitting: Internal Medicine

## 2010-08-14 ENCOUNTER — Encounter: Payer: Self-pay | Admitting: Internal Medicine

## 2010-09-01 ENCOUNTER — Other Ambulatory Visit: Payer: Self-pay | Admitting: Internal Medicine

## 2010-09-08 ENCOUNTER — Other Ambulatory Visit: Payer: Self-pay | Admitting: Internal Medicine

## 2010-09-10 ENCOUNTER — Telehealth: Payer: Self-pay | Admitting: Internal Medicine

## 2010-09-17 NOTE — Telephone Encounter (Signed)
Patient's wife called and said she has heard from [facility] about this prescription for Cartia and Tenna Child says that they received prescription, but it was missing name of mediction---please resend   Patient gave number for [phone]

## 2010-09-17 NOTE — Telephone Encounter (Signed)
Called [facility] (spoke with Maralyn Sago), RX was received on 09/08/10, patient requesting a specific Brand Claudette Laws) which is not in stock  I called patient and informed him of the conversation with the pharmacy, patient states he will be willing to try a different brand although he is allergic to dye's in pills in the past. I informed patient I will forward this to MD for approval to dispense different brand although chart indicates allergic to dyes  Dr.Hopper please further advise

## 2010-09-18 NOTE — Telephone Encounter (Signed)
He has urticaria with diet and medications. If the Claudette Laws brand is not available a different Cardizam preparation that is okay as long as it is not pigmented, is white & does not dye ----- Message ----- From: Stephan Minister, CMA Sent: 09/17/2010 10:14 AM To: Pecola Lawless, MD   ID # [phone] for [facility], Called [facility] (Spoke with Gaylyn Rong) back and informed them of Dx.Hopper's response. The pharmacist indicated they have another brand that is not pigmented, will send to patient

## 2010-09-23 ENCOUNTER — Telehealth: Payer: Self-pay | Admitting: Internal Medicine

## 2010-09-23 NOTE — Telephone Encounter (Signed)
LMTCB--OK TO ADD FOR FLU SHOT ON INJECTION SCHEDULE FOR Friday.

## 2010-09-24 ENCOUNTER — Ambulatory Visit (INDEPENDENT_AMBULATORY_CARE_PROVIDER_SITE_OTHER): Payer: Medicare Other

## 2010-09-24 DIAGNOSIS — Z23 Encounter for immunization: Secondary | ICD-10-CM

## 2010-09-24 NOTE — Telephone Encounter (Signed)
lmomtcb  

## 2010-09-25 ENCOUNTER — Telehealth: Payer: Self-pay | Admitting: *Deleted

## 2010-09-25 NOTE — Telephone Encounter (Signed)
Caller states that they faxed over paperwork last week to obtain authorization for Back Brace for patient and have not received a response. Have you seen original paperwork? She will fax again today.

## 2010-09-25 NOTE — Telephone Encounter (Signed)
Pt came yesterday for flu shot. Carron Curie, CMA

## 2010-09-25 NOTE — Telephone Encounter (Signed)
I called patient to verify he requested back brace, patient replied yes.  Form placed on ledge for sig, then to be faxed back and sent for scanning

## 2010-09-26 NOTE — Telephone Encounter (Signed)
I want Physical Therapy to see him to assess back issues. Wearing back brace could actually weaken low back muscles if worn long term

## 2010-09-26 NOTE — Telephone Encounter (Signed)
Wife called back to state that patient has had PT before---says he wears her back brace and gets comfort when he is working around---call wife to talk about a back brace for patient at [phone]

## 2010-09-26 NOTE — Telephone Encounter (Signed)
Spoke with patient's wife, patient see's Dr.Newton for his back, patient's wife would like for [place] to forward paperwork to Dr.Newton (Fax [phone])

## 2010-09-26 NOTE — Telephone Encounter (Signed)
Left message on voicemail with Dr.Hopper's response, patient to call if he would like to persue seeing PT

## 2010-10-06 LAB — CBC
Platelets: 246
RDW: 12.9

## 2010-10-06 LAB — BASIC METABOLIC PANEL
BUN: 17
Calcium: 8.7
GFR calc non Af Amer: >60
Glucose, Bld: 213 — ABNORMAL HIGH
Sodium: 137

## 2010-10-06 LAB — DIFFERENTIAL
Basophils Absolute: 0.1
Lymphocytes Relative: 22
Neutro Abs: 4

## 2010-10-08 LAB — COMPREHENSIVE METABOLIC PANEL
Albumin: 3.4 — ABNORMAL LOW
Alkaline Phosphatase: 77
BUN: 10
Chloride: 105
Creatinine, Ser: 1.03
Glucose, Bld: 136 — ABNORMAL HIGH
Potassium: 3.9
Total Bilirubin: 0.5
Total Protein: 5.8 — ABNORMAL LOW

## 2010-10-08 LAB — CBC
HCT: 39.6
Hemoglobin: 13.6
MCV: 90.6
Platelets: 218
RDW: 12.9

## 2010-10-08 LAB — URINALYSIS, ROUTINE W REFLEX MICROSCOPIC
Glucose, UA: NEGATIVE
Hgb urine dipstick: NEGATIVE
Ketones, ur: NEGATIVE
Protein, ur: NEGATIVE
pH: 5

## 2010-10-08 LAB — DIFFERENTIAL
Basophils Absolute: 0.1
Basophils Relative: 1
Lymphocytes Relative: 17
Neutro Abs: 4.6
Neutrophils Relative %: 69

## 2010-10-08 LAB — APTT: aPTT: 27

## 2010-10-08 LAB — PROTIME-INR
INR: 0.9
Prothrombin Time: 12.4

## 2010-10-08 LAB — DIGOXIN LEVEL: Digoxin Level: 1.7

## 2010-10-09 NOTE — Telephone Encounter (Signed)
error 

## 2010-10-10 LAB — HEPATIC FUNCTION PANEL
AST: 22
Bilirubin, Direct: 0.1
Indirect Bilirubin: 0.4
Total Protein: 6.5

## 2010-10-10 LAB — BASIC METABOLIC PANEL
BUN: 12
CO2: 26
Calcium: 8.8
Creatinine, Ser: 1.08
Glucose, Bld: 120 — ABNORMAL HIGH
Sodium: 139

## 2010-10-10 LAB — CBC
Hemoglobin: 14.9
MCHC: 34.5
Platelets: 262
RDW: 14

## 2010-10-10 LAB — URINALYSIS, ROUTINE W REFLEX MICROSCOPIC
Bilirubin Urine: NEGATIVE
Hgb urine dipstick: NEGATIVE
Ketones, ur: NEGATIVE
Nitrite: NEGATIVE
Protein, ur: NEGATIVE
Urobilinogen, UA: 1

## 2010-10-10 LAB — DIFFERENTIAL
Basophils Absolute: 0.1
Basophils Relative: 1
Monocytes Absolute: 0.9
Neutro Abs: 5.8

## 2010-10-15 ENCOUNTER — Other Ambulatory Visit: Payer: Self-pay | Admitting: Internal Medicine

## 2010-10-15 MED ORDER — COLESEVELAM HCL 625 MG PO TABS: ORAL_TABLET | ORAL | Status: DC

## 2010-10-15 NOTE — Telephone Encounter (Signed)
RX sent, patient needs to have labs prior to next refill

## 2010-10-17 LAB — CBC
MCV: 92.9 fL (ref 78.0–100.0)
RBC: 4.65 MIL/uL (ref 4.22–5.81)
WBC: 7.3 10*3/uL (ref 4.0–10.5)

## 2010-10-17 LAB — COMPREHENSIVE METABOLIC PANEL
ALT: 45 U/L (ref 0–53)
AST: 19 U/L (ref 0–37)
CO2: 29 mEq/L (ref 19–32)
Chloride: 101 mEq/L (ref 96–112)
GFR calc Af Amer: >60 mL/min (ref >60)
GFR calc non Af Amer: >60 mL/min (ref >60)
Sodium: 135 mEq/L (ref 135–145)
Total Bilirubin: 0.8 mg/dL (ref 0.3–1.2)

## 2010-11-17 ENCOUNTER — Encounter: Payer: Self-pay | Admitting: Internal Medicine

## 2010-11-17 ENCOUNTER — Ambulatory Visit (INDEPENDENT_AMBULATORY_CARE_PROVIDER_SITE_OTHER): Payer: Medicare Other | Admitting: Internal Medicine

## 2010-11-17 VITALS — BP 126/80 | HR 69 | Ht 69.0 in | Wt 181.8 lb

## 2010-11-17 DIAGNOSIS — J411 Mucopurulent chronic bronchitis: Secondary | ICD-10-CM

## 2010-11-17 MED ORDER — BENZONATATE 200 MG PO CAPS: 200.0000 mg | ORAL_CAPSULE | Freq: Three times a day (TID) | ORAL | Status: AC | PRN

## 2010-11-17 NOTE — Patient Instructions (Signed)
Depo 80  Script refill benzonatate for cough if needed.

## 2010-11-17 NOTE — Assessment & Plan Note (Signed)
Acute upper respiratory infection with bronchitis, viral pattern. We will hold antibiotics for now. After discussion among the 3 of [place], I did agree to give him Depo-Medrol today. He understands to allow for that with his blood sugar. Benzonatate is refilled.

## 2010-11-17 NOTE — Progress Notes (Signed)
Patient ID: [name], male    DOB: Apr 04, 1931, 77 y.o.   MRN: [id number]  HPI Last here Decmber 6, 2011. Wive here now. He is going to [place] for second opinion about ? Parkinsons.  He notices that he coughs more, with some sputum, if he lies on right side. No recent antibiotics.  Increased watery nose with Spring pollen.  CXR- 12/25/09- clear, NAD.  Asks refill generic ambien for occasional nonspecific insomnia.   11/17/10- 78 yoM never smoker followed for recurrent bronchitis, complicated by hx urticaria, PAFIB, DM. Wife here Has had flu vaccine. He had a recent upper respiratory infection with watery rhinorrhea headache and malaise because of which she spent a few days in the house and in bed. He took Benadryl and Tylenol. That illness is largely resolved. He still has nasal congestion and some cough producing clear mucus. He has not been needing his rescue inhaler. He asks for a Depo-Medrol injection and we discussed his blood sugar status. He likes benzonatate for cough.  Review of Systems .see HPI Constitutional:   No-   weight loss, night sweats, fevers, chills, fatigue, lassitude. HEENT:   No-  headaches, difficulty swallowing, tooth/dental problems, sore throat,       No-  sneezing, itching, ear ache, +nasal congestion, post nasal drip,  CV:  No-   chest pain, orthopnea, PND, swelling in lower extremities, anasarca, or dizziness, palpitations Resp: No-   shortness of breath with exertion or at rest.              +   productive cough,  No non-productive cough,  No- coughing up of blood.              No-   change in color of mucus.  No- wheezing.   Skin: No-   rash or lesions. GI:  No-   heartburn, indigestion, abdominal pain, nausea, vomiting, diarrhea,                 change in bowel habits, loss of appetite GU: No-   dysuria, change in color of urine, no urgency or frequency.  No- flank pain. MS:  No-   joint pain or swelling.  No- decreased range of motion.  No- back  pain. Neuro-     nothing unusual Psych:  No- change in mood or affect. No depression or anxiety.  No memory loss.    Objective:   Physical Exam General- Alert, Oriented, Affect-appropriate, Distress- none acute Skin- rash-none, lesions- none, excoriation- none Lymphadenopathy- none Head- atraumatic            Eyes- Gross vision intact, PERRLA, conjunctivae clear secretions            Ears- Hearing, canals-normal            Nose- pale and watery, no-Septal dev, mucus, polyps, erosion, perforation             Throat- Mallampati II , mucosa clear , drainage- none, tonsils- atrophic Neck- flexible , trachea midline, no stridor , thyroid nl, carotid no bruit Chest - symmetrical excursion , unlabored           Heart/CV- RRR , no murmur , no gallop  , no rub, nl s1 s2                           - JVD- none , edema- none, stasis changes- none, varices- none  Lung- clear to P&A, wheeze- none, cough- none , dullness-none, rub- none           Chest wall-  Abd- tender-no, distended-no, bowel sounds-present, HSM- no Br/ Gen/ Rectal- Not done, not indicated Extrem- cyanosis- none, clubbing, none, atrophy- none, strength- nl Neuro- grossly intact to observation

## 2010-11-21 ENCOUNTER — Other Ambulatory Visit: Payer: Self-pay | Admitting: Internal Medicine

## 2010-11-21 DIAGNOSIS — E119 Type 2 diabetes mellitus without complications: Secondary | ICD-10-CM

## 2010-11-24 ENCOUNTER — Other Ambulatory Visit (INDEPENDENT_AMBULATORY_CARE_PROVIDER_SITE_OTHER): Payer: Medicare Other

## 2010-11-24 ENCOUNTER — Other Ambulatory Visit: Payer: Medicare Other

## 2010-11-24 DIAGNOSIS — E119 Type 2 diabetes mellitus without complications: Secondary | ICD-10-CM

## 2010-11-24 NOTE — Progress Notes (Signed)
12  

## 2010-12-01 ENCOUNTER — Encounter: Payer: Self-pay | Admitting: Internal Medicine

## 2010-12-01 ENCOUNTER — Ambulatory Visit (INDEPENDENT_AMBULATORY_CARE_PROVIDER_SITE_OTHER): Payer: Medicare Other | Admitting: Internal Medicine

## 2010-12-01 DIAGNOSIS — E119 Type 2 diabetes mellitus without complications: Secondary | ICD-10-CM

## 2010-12-01 DIAGNOSIS — K219 Gastro-esophageal reflux disease without esophagitis: Secondary | ICD-10-CM

## 2010-12-01 NOTE — Progress Notes (Signed)
Subjective:    Patient ID: [name], male    DOB: 1931-11-17, 77 y.o.   MRN: [id number]  HPI Diabetes status assessment: Fasting or morning glucose range:  90-125  . Highest glucose 2 hours after any meal:  Not checked. Hypoglycemia :  no .                                                     Excess thirst :no;  Excess hunger:  no ;  Excess urination:  no.                                  Lightheadedness with standing:  no. Chest pain:  No exertional cp ; Palpitations :no ;  Pain in  calves with walking:  no .                                                                                                                                 Non healing skin  ulcers or sores,especially over the feet:  no. Numbness or tingling or burning in feet : no .                                                                                                                                              Significant change in  Weight : up 5 #. Vision changes : no; injections for Macular Degeneration, [facility]  .                                                                    Exercise : treadmill 10 min; machines X 10 min  > 4X/week. Nutrition/diet:  No plan. Medication compliance : yes. Medication adverse  Effects:  no . Foot care : no.  A1c/ urine microalbumin monitor:  6.9% down from 7.3%  Review of Systems intermittent left upper  quadrant gas symptoms last week which have resolved with Maalox. This was nonexertional     Objective:   Physical Exam Gen.: Thin but healthy  & well-nourished, appropriate and alert, weight excellent Eyes: No lid/conjunctival changes Ears: aids bilaterally Neck:  Thyroid normal Respiratory: No increased work of breathing or abnormal breath sounds Cardiac : regular rhythm, no extra heart sounds, gallop. Heart sounds distant; faint systolic murmur Skin: No rashes, lesions, ulcers or ischemic changes Muscle skeletal:minot toenail changes Vasc:All pulses  intact, no bruits present; venous spiders over feet Neuro: Normal deep tendon reflexes, alert & oriented, sensation over feet intact  Psych: judgment and insight, mood and affect normal         Assessment & Plan:  #1 diabetes, excellent control    #2 Probable reflux; prevention discussed.  Plan: See orders and recommendations

## 2010-12-01 NOTE — Patient Instructions (Signed)
Eat a low-fat diet with lots of fruits and vegetables, up to 7-9 servings per day. Consume less than 40 grams of sugar per day from foods & drinks with High Fructose Corn Sugar as #1,2,3 or # 4 on label. Follow the low carb nutrition program in [employer] as closely as possible to prevent Diabetes progression & complications. White carbohydrates (potatoes, rice, bread, and pasta) have a high spike of sugar and a high load of sugar. For example a  baked potato has a cup of sugar and a  french fry  2 teaspoons of sugar. Yams, wild  rice, whole grained bread &  wheat pasta have been much lower spike and load of  sugar. Portions should be the size of a deck of cards or your palm.  The triggers for dyspepsia or"gas" include stress; the "aspirin family" ; alcohol; peppermint; and caffeine (coffee, tea, cola, and chocolate). The aspirin family would include aspirin and the nonsteroidal agents such as ibuprofen &  Naproxen. Tylenol would not cause reflux. If having these symptoms  ; food & drink should be avoided for @ least 2 hours before going to bed.  Please  schedule fasting Labs in 4 months  : BMET,Lipids, hepatic panel, A1c.  Please bring these instructions to that Lab appt.

## 2010-12-08 ENCOUNTER — Other Ambulatory Visit: Payer: Self-pay | Admitting: Internal Medicine

## 2010-12-13 ENCOUNTER — Other Ambulatory Visit: Payer: Self-pay | Admitting: Internal Medicine

## 2010-12-15 NOTE — Telephone Encounter (Signed)
Digoxin level 995.20 

## 2010-12-29 ENCOUNTER — Other Ambulatory Visit: Payer: Self-pay | Admitting: Internal Medicine

## 2010-12-29 NOTE — Telephone Encounter (Signed)
LIPID/HEP 272.4

## 2010-12-31 ENCOUNTER — Other Ambulatory Visit: Payer: Self-pay | Admitting: Internal Medicine

## 2010-12-31 DIAGNOSIS — T887XXA Unspecified adverse effect of drug or medicament, initial encounter: Secondary | ICD-10-CM

## 2011-01-01 ENCOUNTER — Telehealth: Payer: Self-pay | Admitting: Internal Medicine

## 2011-01-01 ENCOUNTER — Other Ambulatory Visit: Payer: Medicare Other

## 2011-01-01 DIAGNOSIS — T887XXA Unspecified adverse effect of drug or medicament, initial encounter: Secondary | ICD-10-CM

## 2011-01-01 NOTE — Telephone Encounter (Signed)
Wife returning call

## 2011-01-01 NOTE — Telephone Encounter (Signed)
LMTCBN 

## 2011-01-01 NOTE — Telephone Encounter (Signed)
lmomtcb x1 

## 2011-01-02 ENCOUNTER — Other Ambulatory Visit: Payer: Self-pay | Admitting: Internal Medicine

## 2011-01-02 ENCOUNTER — Telehealth: Payer: Self-pay | Admitting: Internal Medicine

## 2011-01-02 MED ORDER — ZOLPIDEM TARTRATE 5 MG PO TABS: 5.0000 mg | ORAL_TABLET | Freq: Every evening | ORAL | Status: DC | PRN

## 2011-01-02 MED ORDER — AZITHROMYCIN 250 MG PO TABS: ORAL_TABLET | ORAL | Status: DC

## 2011-01-02 NOTE — Telephone Encounter (Signed)
Called pt's pharmacy to prior info - was informed by pharmacist that this med does not require prior auth, just authorization from the physician, no refills left.  Called spoke with patient's wife, she is aware.  Last ov with CDY 11/2010.  #30 with 5 refills given verbally as filled prior.

## 2011-01-02 NOTE — Telephone Encounter (Signed)
Spouse aware script sent to pharmacy. Asked what can pt do for cough I advised Delsym OTC.

## 2011-01-02 NOTE — Telephone Encounter (Signed)
C/o productive cough with thick "medium yellow" mucus, Benzonatate w/o relief, headache, sore throat, runny nose, sweats, bodyaches, sneezing x 2 days. Has not checked temp. Denies: wheezing, n/v/d/c. [facility] on [address]  Please advise, thank you.  Allergies  Allergen Reactions  . Atorvastatin   . Codeine   . Ezetimibe   . Oxycodone-Acetaminophen   . Rosuvastatin   . Simvastatin   . Tetanus Toxoid

## 2011-01-02 NOTE — Telephone Encounter (Signed)
Per CY okay to give Zpak #1 take as directed no refills. 

## 2011-01-05 ENCOUNTER — Emergency Department (HOSPITAL_COMMUNITY)
Admission: EM | Admit: 2011-01-05 | Discharge: 2011-01-05 | Disposition: A | Discharge status: Home / Self Care | Payer: Medicare Other | Attending: Emergency Medicine | Admitting: Emergency Medicine

## 2011-01-05 ENCOUNTER — Emergency Department (HOSPITAL_COMMUNITY): Payer: Medicare Other

## 2011-01-05 ENCOUNTER — Encounter (HOSPITAL_COMMUNITY): Payer: Self-pay | Admitting: *Deleted

## 2011-01-05 DIAGNOSIS — R61 Generalized hyperhidrosis: Secondary | ICD-10-CM | POA: Insufficient documentation

## 2011-01-05 DIAGNOSIS — R062 Wheezing: Secondary | ICD-10-CM

## 2011-01-05 DIAGNOSIS — R059 Cough, unspecified: Secondary | ICD-10-CM

## 2011-01-05 DIAGNOSIS — Z8546 Personal history of malignant neoplasm of prostate: Secondary | ICD-10-CM | POA: Insufficient documentation

## 2011-01-05 DIAGNOSIS — Z794 Long term (current) use of insulin: Secondary | ICD-10-CM | POA: Insufficient documentation

## 2011-01-05 DIAGNOSIS — R5381 Other malaise: Secondary | ICD-10-CM | POA: Insufficient documentation

## 2011-01-05 DIAGNOSIS — R05 Cough: Secondary | ICD-10-CM

## 2011-01-05 DIAGNOSIS — J3489 Other specified disorders of nose and nasal sinuses: Secondary | ICD-10-CM | POA: Insufficient documentation

## 2011-01-05 DIAGNOSIS — E119 Type 2 diabetes mellitus without complications: Secondary | ICD-10-CM | POA: Insufficient documentation

## 2011-01-05 DIAGNOSIS — R070 Pain in throat: Secondary | ICD-10-CM | POA: Insufficient documentation

## 2011-01-05 DIAGNOSIS — R6883 Chills (without fever): Secondary | ICD-10-CM | POA: Insufficient documentation

## 2011-01-05 DIAGNOSIS — E785 Hyperlipidemia, unspecified: Secondary | ICD-10-CM | POA: Insufficient documentation

## 2011-01-05 DIAGNOSIS — R079 Chest pain, unspecified: Secondary | ICD-10-CM | POA: Insufficient documentation

## 2011-01-05 DIAGNOSIS — Z79899 Other long term (current) drug therapy: Secondary | ICD-10-CM | POA: Insufficient documentation

## 2011-01-05 DIAGNOSIS — IMO0001 Reserved for inherently not codable concepts without codable children: Secondary | ICD-10-CM | POA: Insufficient documentation

## 2011-01-05 DIAGNOSIS — R5383 Other fatigue: Secondary | ICD-10-CM | POA: Insufficient documentation

## 2011-01-05 DIAGNOSIS — E039 Hypothyroidism, unspecified: Secondary | ICD-10-CM | POA: Insufficient documentation

## 2011-01-05 LAB — POCT I-STAT, CHEM 8
Chloride: 101 mEq/L (ref 96–112)
Glucose, Bld: 77 mg/dL (ref 70–99)
HCT: 41 % (ref 39.0–52.0)
Potassium: 3.8 mEq/L (ref 3.5–5.1)
Sodium: 137 mEq/L (ref 135–145)

## 2011-01-05 LAB — CBC
Hemoglobin: 13.4 g/dL (ref 13.0–17.0)
MCHC: 33.7 g/dL (ref 30.0–36.0)
RBC: 4.4 MIL/uL (ref 4.22–5.81)

## 2011-01-05 LAB — DIFFERENTIAL
Basophils Relative: 1 % (ref 0–1)
Lymphs Abs: 1.5 10*3/uL (ref 0.7–4.0)
Monocytes Relative: 15 % — ABNORMAL HIGH (ref 3–12)
Neutro Abs: 2 10*3/uL (ref 1.7–7.7)
Neutrophils Relative %: 47 % (ref 43–77)

## 2011-01-05 MED ORDER — ALBUTEROL SULFATE (5 MG/ML) 0.5% IN NEBU
5.0000 mg | INHALATION_SOLUTION | Freq: Once | RESPIRATORY_TRACT | Status: AC
  Administered 2011-01-05: 5 mg via RESPIRATORY_TRACT
  Filled 2011-01-05: qty 1

## 2011-01-05 MED ORDER — METHYLPREDNISOLONE SODIUM SUCC 125 MG IJ SOLR
125.0000 mg | Freq: Once | INTRAMUSCULAR | Status: AC
  Administered 2011-01-05: 125 mg via INTRAVENOUS
  Filled 2011-01-05: qty 2

## 2011-01-05 MED ORDER — BENZONATATE 100 MG PO CAPS: 100.0000 mg | ORAL_CAPSULE | Freq: Three times a day (TID) | ORAL | Status: AC | PRN

## 2011-01-05 MED ORDER — PREDNISONE 20 MG PO TABS: 20.0000 mg | ORAL_TABLET | Freq: Every day | ORAL | Status: AC

## 2011-01-05 MED ORDER — IPRATROPIUM BROMIDE 0.02 % IN SOLN
0.5000 mg | Freq: Once | RESPIRATORY_TRACT | Status: AC
  Administered 2011-01-05: 0.5 mg via RESPIRATORY_TRACT
  Filled 2011-01-05: qty 2.5

## 2011-01-05 MED ORDER — BENZONATATE 100 MG PO CAPS
100.0000 mg | ORAL_CAPSULE | Freq: Once | ORAL | Status: AC
  Administered 2011-01-05: 100 mg via ORAL
  Filled 2011-01-05: qty 1

## 2011-01-05 MED ORDER — SODIUM CHLORIDE 0.9 % IV SOLN
Freq: Once | INTRAVENOUS | Status: AC
  Administered 2011-01-05: 500 mL via INTRAVENOUS

## 2011-01-05 MED ORDER — ALBUTEROL SULFATE (5 MG/ML) 0.5% IN NEBU
10.0000 mg | INHALATION_SOLUTION | Freq: Once | RESPIRATORY_TRACT | Status: AC
  Administered 2011-01-05: 2.5 mg via RESPIRATORY_TRACT
  Filled 2011-01-05: qty 2

## 2011-01-05 NOTE — ED Notes (Signed)
Pt states "this cough started Thurs night, called Dr. Maple Hudson & abx was called in but I keep coughing & can't cough anything up, don't know if it's turned into pneumonia or not"

## 2011-01-05 NOTE — ED Notes (Signed)
Resp. Therapist called for resp. treatment

## 2011-01-05 NOTE — ED Provider Notes (Signed)
History     CSN: [id number]  Arrival date & time 01/05/11  1045   First MD Initiated Contact with Patient 01/05/11 1202      Chief Complaint  Patient presents with  . Cough    (Consider location/radiation/quality/duration/timing/severity/associated sxs/prior treatment) HPI Comments: Pt states that he called his PCP, Dr. Maple Hudson, for his persistent cough. He was given a z pack & has 2 more days left. Pt has come in concerned that he has developed PNA. HIs chest is tender from coughing. Denies fever night sweats chills. Pt states he had a sore throat Thursday night and has been feeling fatigued and weak. Reports aches in his legs & rhinorrhea  as well.   Patient is a 77 y.o. male presenting with cough. The history is provided by the patient.  Cough This is a new problem. The current episode started more than 2 days ago. The problem occurs every few minutes. The problem has not changed since onset.The cough is non-productive. There has been no fever. Associated symptoms include chills, sweats and rhinorrhea. Pertinent negatives include no chest pain, no weight loss, no ear congestion, no ear pain, no headaches, no sore throat, no myalgias, no shortness of breath, no wheezing and no eye redness. He has tried nothing for the symptoms. He is not a smoker. His past medical history does not include bronchitis, pneumonia, bronchiectasis, COPD, emphysema or asthma.    Past Medical History  Diagnosis Date  . History of skin cancer   . Hypothyroidism   . Prostate cancer   . Asthmatic bronchitis   . Allergic rhinitis   . Urticaria   . Dyslipidemia   . DM type 2 (diabetes mellitus, type 2)   . Macular degeneration     Past Surgical History  Procedure Date  . Popliteal synovial cyst excision 2005  . Gallbladder surgery 2006    no cholecystectomy  . Total knee arthroplasty 2008    right  . Total knee arthroplasty 2010    left  . Partial thymectomy 1985    for nodules  . Lung removal,  partial age 70    for bronchiectasis    Family History  Problem Relation Age of Onset  . Other Sister     ophth disease  . Heart attack Father     x7  . Heart disease Father     MI x7  . Hypertension Mother   . Stroke Mother 68    History  Substance Use Topics  . Smoking status: Never Smoker   . Smokeless tobacco: Never Used  . Alcohol Use: No      Review of Systems  Constitutional: Positive for chills and fatigue. Negative for fever and weight loss.  HENT: Positive for congestion and rhinorrhea. Negative for ear pain, sore throat, sneezing, neck pain, neck stiffness, sinus pressure and tinnitus.   Eyes: Negative for redness and visual disturbance.  Respiratory: Positive for cough and chest tightness. Negative for shortness of breath and wheezing.   Cardiovascular: Negative for chest pain and palpitations.  Gastrointestinal: Negative for nausea, vomiting, abdominal pain and diarrhea.  Genitourinary: Negative for dysuria.  Musculoskeletal: Negative for myalgias.  Skin: Negative for color change and rash.  Neurological: Negative for dizziness, weakness and headaches.  Hematological: Does not bruise/bleed easily.  Psychiatric/Behavioral: Negative for confusion.  All other systems reviewed and are negative.    Allergies  Colchicine; Atorvastatin; Ceftin; Codeine; Erythromycin; Ezetimibe; Nitroglycerin; Oxycodone-acetaminophen; Rosuvastatin; Simvastatin; Vantin; Contrast media; and Tetanus toxoid  Home Medications  Current Outpatient Rx  Name Route Sig Dispense Refill  . ALBUTEROL SULFATE HFA 108 (90 BASE) MCG/ACT IN AERS Inhalation Inhale 2 puffs into the lungs every 4 (four) hours as needed.      . ASPIRIN 81 MG PO TABS Oral Take 81 mg by mouth daily.      . AZITHROMYCIN 250 MG PO TABS Oral Take 250 mg by mouth daily. Take 2 tablets (500 mg) on  Day 1,  followed by 1 tablet (250 mg) once daily on Days 2 through 5. Pt started this on 01-03-11 for 5 day therapy. Pt's on  day 4 of therapy     . BD INSULIN SYR ULTRAFINE II 31G X 5/16" 1 ML MISC  USE THREE TIMES A DAY 100 each 3  . CARBIDOPA-LEVODOPA 25-100 MG PO TABS Oral Take 1 tablet by mouth daily.    Marland Kitchen CARTIA XT 240 MG PO CP24  TAKE 1 CAPSULE DAILY       *WATSON 90 each 2  . VITAMIN D3 1000 UNITS PO CAPS Oral Take 1 capsule by mouth daily.      . DELSYM PO Oral Take 10 mLs by mouth daily.      Marland Kitchen DIAZEPAM 2 MG PO TABS  1 tab by mouth every 8 hours as needed - in the place of meclizine     . ETHACRYNIC ACID 25 MG PO TABS Oral Take 25 mg by mouth daily.      . FUROSEMIDE 20 MG PO TABS Oral Take 1 tablet (20 mg total) by mouth daily. *DYE FREE* 90 tablet 1  . INSULIN GLARGINE 100 UNIT/ML [place] SOLN Subcutaneous Inject 10-25 Units into the skin at bedtime. May go as high as 35 units at bedtime    . INSULIN REGULAR HUMAN 100 UNIT/ML IJ SOLN       . LANOXIN 0.25 MG PO TABS  take 1 tablet by mouth once daily 90 tablet 0    Labs due 03/2011  . OCUVITE EXTRA PO Oral Take 1 tablet by mouth daily.      Marland Kitchen OMEPRAZOLE MAGNESIUM 20 MG PO TBEC Oral Take 20 mg by mouth daily.      Marland Kitchen SYNTHROID 50 MCG PO TABS  take 3 tablets by mouth daily EXCEPT 4 TABLETS ON WEDNESDAY AND SUNDAY 300 tablet 1  . VITAMIN C 500 MG PO TABS Oral Take 500 mg by mouth daily.      . WELCHOL 625 MG PO TABS  TAKE 2 TABLETS 3 TIMES A   DAY. PLEASE MAKE AN        APPOINTMENT FORLABWORK    11/2010 540 tablet 0    **LABS DUE ASAP-NO ADDITIONAL REFILLS TO BE GIVEN* ...  . ZOLPIDEM TARTRATE 5 MG PO TABS Oral Take 1 tablet (5 mg total) by mouth at bedtime as needed. 30 tablet 5    BP 97/47  Pulse 72  Temp(Src) 98.4 F (36.9 C) (Oral)  Resp 18  Wt 170 lb (77.111 kg)  SpO2 97%  Physical Exam  Constitutional: He is oriented to person, place, and time. He appears well-nourished. No distress.  HENT:  Head: Normocephalic and atraumatic. No trismus in the jaw.  Right Ear: External ear normal. No drainage or tenderness. No mastoid tenderness.  Left Ear:  External ear normal. No drainage or tenderness. No mastoid tenderness.  Nose: Nose normal. No rhinorrhea or sinus tenderness.  Mouth/Throat: Uvula is midline, oropharynx is clear and moist and mucous membranes are normal. No uvula swelling.  No oropharyngeal exudate.  Eyes: Conjunctivae and EOM are normal. Right eye exhibits no discharge. Left eye exhibits no discharge. No scleral icterus.  Neck: Normal range of motion. Neck supple.  Cardiovascular: Normal rate, regular rhythm and normal heart sounds.   Pulmonary/Chest: Effort normal. No stridor. No respiratory distress. He has wheezes. He exhibits tenderness.  Abdominal: Soft. There is no tenderness.  Musculoskeletal: Normal range of motion.  Neurological: He is alert and oriented to person, place, and time.  Skin: Skin is warm and dry. No rash noted. He is not diaphoretic.  Psychiatric: He has a normal mood and affect. His behavior is normal.    ED Course  Procedures (including critical care time)  Labs Reviewed  DIFFERENTIAL - Abnormal; Notable for the following:    Monocytes Relative 15 (*)    All other components within normal limits  POCT I-STAT, CHEM 8 - Abnormal; Notable for the following:    BUN 25 (*)    Creatinine, Ser 1.70 (*)    Calcium, Ion 1.08 (*)    All other components within normal limits  CBC  I-STAT, CHEM 8   Dg Chest 2 View  01/05/2011  *RADIOLOGY REPORT*  Clinical Data: Cough and congestion  CHEST - 2 VIEW  Comparison: 12/25/2009  Findings: COPD with pulmonary hyperinflation.  Mild scarring in the left base is unchanged.  Negative for pneumonia.  Negative for heart failure or effusion.  IMPRESSION: COPD.  No acute cardiopulmonary disease.  Original Report Authenticated By: Camelia Phenes, M.D.     No diagnosis found.  Due to patient's lungs wheezing during auscultation he received an hour-long neb and 125 Solu-Medrol during his hospital stay.  I reevaluated him after this treatment and his lungs are  currently clear to auscultation.  Patient is still coughing and will be sent home with a cough suppressant as well as a prednisone burst.  After treatment the nurse and related patient and his O2 sat stayed at 96% on room air.  Patient currently is not short of breath is hemodynamically stable and is in no acute distress I personally rechecked his blood pressure at discharge and it was 137/77.  The nurse will put in another set of vitals when she discharges the patient.  This patient was seen with Dr. Freida Busman who agrees with the above plan to discharge patient.   MDM  Cough, Wheezing         [place], [place] 01/05/11 1521

## 2011-01-05 NOTE — ED Provider Notes (Signed)
Medical screening examination/treatment/procedure(s) were conducted as a shared visit with non-physician practitioner(s) and myself.  I personally evaluated the patient during the encounter  Patient seen examined. Chest x-ray was without signs of pneumonia. Patient with mild bronchospasm was given albuterol treatment here. Suspect he has bronchitis we'll treat with course of prednisone and he will finish his course of Zithromax  Toy Baker, MD 01/05/11 1337

## 2011-01-05 NOTE — ED Notes (Signed)
Pharmacy called about the medications

## 2011-01-05 NOTE — ED Notes (Signed)
Patient reports of dry and non-productive cough that starts Thursday, claimed taking cough medicine since that day but still no relief. Denied of SOB.

## 2011-01-05 NOTE — ED Notes (Signed)
RT called for breathing treatment. Pt alert and oriented x4. Respirations even and unlabored, bilateral symmetrical rise and fall of chest. Skin warm and dry. In no acute distress. Denies needs.

## 2011-01-05 NOTE — ED Notes (Signed)
Pt ambulated while maintaining good O2 sat

## 2011-01-05 NOTE — ED Notes (Signed)
Patient transported to X-ray. Will get labs when pt returns

## 2011-01-06 NOTE — ED Provider Notes (Signed)
Medical screening examination/treatment/procedure(s) were performed by non-physician practitioner and as supervising physician I was immediately available for consultation/collaboration.  Natina Wiginton T Berdina Cheever, MD 01/06/11 0712 

## 2011-01-07 ENCOUNTER — Telehealth: Payer: Self-pay | Admitting: Internal Medicine

## 2011-01-07 MED ORDER — AMOXICILLIN 500 MG PO CAPS: 500.0000 mg | ORAL_CAPSULE | Freq: Three times a day (TID) | ORAL | Status: AC

## 2011-01-07 MED ORDER — TRAMADOL HCL 50 MG PO TABS: 50.0000 mg | ORAL_TABLET | Freq: Four times a day (QID) | ORAL | Status: DC | PRN

## 2011-01-07 NOTE — Telephone Encounter (Signed)
I spoke with spouse and she states she had to take pt to the ED on 01/05/11 and was dx w/ bronchitis. She states they gave him prednisone to start and benzonatate. The cough is no better and is not getting any mucus up x Thursday. Per spouse pt is not able to sleep at night due to all his coughing. Pt denies any fever, nausea, vomiting, body aches. Spouse is requesting recs. Please advise Dr. Maple Hudson, thanks  Allergies  Allergen Reactions  . Colchicine Anaphylaxis  . Atorvastatin Other (See Comments)    Joint soreness  . Ceftin Diarrhea  . Codeine Hives  . Erythromycin Diarrhea  . Ezetimibe Other (See Comments)    Joint soreness  . Nitroglycerin Other (See Comments)    lowers blood pressure   . Oxycodone-Acetaminophen Hives  . Rosuvastatin Other (See Comments)    Joint soreness  . Simvastatin Other (See Comments)    Joint soreness  . Vantin Other (See Comments)    Doesn't remember  . Contrast Media (Iodinated Diagnostic Agents) Nausea And Vomiting and Rash  . Tetanus Toxoid Rash

## 2011-01-07 NOTE — Telephone Encounter (Signed)
Per CDY: try amoxicillin 500mg  #21 1 tid, for cough try tramadol 50mg  #10 1 tid prn, and try vicks vapo rub.  Called spoke with patient's wife, advised of CDY's recs.  Pt spouse verbalized her understanding.  Per CDY: has allergy to narcotics, if patient develops rash/hives with the tramadol, stop this medication and call the office.  Pt spouse verbalized her understanding.  She did ask what patient may take for head congestion and drainage.  i advised mucinex d bid prn and zyrtec 10mg  at bedtime.  Pt spouse verbalized her understanding of this as well and is aware these are OTC.  rx's sent to [facility] and [facility] per spouse request.

## 2011-01-08 ENCOUNTER — Telehealth: Payer: Self-pay | Admitting: Internal Medicine

## 2011-01-08 NOTE — Telephone Encounter (Signed)
Have received fax and faxed back to [phone]. Will give form to [facility] to hold until we receive approval.

## 2011-01-08 NOTE — Telephone Encounter (Signed)
I spoke with pt spouse and she states pt's Ambien needs PA. I have called [phone] and states they will fax form over. Will await fax

## 2011-01-09 NOTE — Telephone Encounter (Signed)
Paperwork is in Triage holding area until decision made by [employer].

## 2011-01-11 ENCOUNTER — Telehealth: Payer: Self-pay | Admitting: Pulmonary Disease

## 2011-01-11 NOTE — Telephone Encounter (Signed)
Pt diagnosed recently with bronchitis.  Has been treated with abx.  Main c/o cough.  Allergic to narcotics, no improvement with tessalon pearls.  Dr. Maple Hudson called in tramadol, but didn';t take because of concern of getting hives (has no h/o allergy to tramadol).  Still with cough and upper airway wheezing. I have asked them to try tramadol, but he does not want to do that.  I really have nothing else to offer them, and asked they call office in am to discuss with triage nurse/MD.  They are to go to er if worsens.

## 2011-01-12 ENCOUNTER — Telehealth: Payer: Self-pay | Admitting: Internal Medicine

## 2011-01-12 NOTE — Telephone Encounter (Signed)
Per CY-work in when and where able; he may do best to see his PCP or UC at this time as CY is booked.

## 2011-01-12 NOTE — Telephone Encounter (Signed)
Spoke with pt's spouse and notified of recs per CDY. Pt was scheduled to see CDY on 01/14/11 at 10:30 am and will go to [facility] or ED sooner if needed.

## 2011-01-12 NOTE — Telephone Encounter (Signed)
I spoke with spouse and she states pt has tried OTC delsym and it did not help as well as pt currently takes prednisone 20 mg a day and does feel that the medrol taper would help. Please advise Dr. Maple Hudson, thanks

## 2011-01-12 NOTE — Telephone Encounter (Signed)
Offer medrol taper- 8 mg, # 20, 4 X 2 DAYS, 3 X 2 DAYS, 2 X 2 DAYS, 1 X 2 DAYS  Also take otc Delsym for cough

## 2011-01-12 NOTE — Telephone Encounter (Signed)
Requests alternative to delsym for an OTC.  Requests to be reached at [phone].  [name]

## 2011-01-12 NOTE — Telephone Encounter (Signed)
I spoke with pt and he c/o coughing from PND and wheezing. Pt states he can't sleep. Pt has not tried tramadol bc he scared he might break out in hives. Pt states this has been going on x 2 weeks now. Pt is requesting an apt today to be seen. Please advise Dr. Maple Hudson, thanks  Allergies  Allergen Reactions  . Colchicine Anaphylaxis  . Atorvastatin Other (See Comments)    Joint soreness  . Ceftin Diarrhea  . Codeine Hives  . Erythromycin Diarrhea  . Ezetimibe Other (See Comments)    Joint soreness  . Nitroglycerin Other (See Comments)    lowers blood pressure   . Oxycodone-Acetaminophen Hives  . Rosuvastatin Other (See Comments)    Joint soreness  . Simvastatin Other (See Comments)    Joint soreness  . Vantin Other (See Comments)    Doesn't remember  . Contrast Media (Iodinated Diagnostic Agents) Nausea And Vomiting and Rash  . Tetanus Toxoid Rash

## 2011-01-14 ENCOUNTER — Ambulatory Visit (INDEPENDENT_AMBULATORY_CARE_PROVIDER_SITE_OTHER): Payer: Medicare Other | Admitting: Internal Medicine

## 2011-01-14 ENCOUNTER — Encounter: Payer: Self-pay | Admitting: Internal Medicine

## 2011-01-14 VITALS — BP 120/68 | HR 79 | Ht 69.0 in | Wt 174.6 lb

## 2011-01-14 DIAGNOSIS — J209 Acute bronchitis, unspecified: Secondary | ICD-10-CM | POA: Diagnosis not present

## 2011-01-14 DIAGNOSIS — J40 Bronchitis, not specified as acute or chronic: Secondary | ICD-10-CM | POA: Diagnosis not present

## 2011-01-14 DIAGNOSIS — B9789 Other viral agents as the cause of diseases classified elsewhere: Secondary | ICD-10-CM | POA: Diagnosis not present

## 2011-01-14 DIAGNOSIS — J206 Acute bronchitis due to rhinovirus: Secondary | ICD-10-CM

## 2011-01-14 DIAGNOSIS — G4700 Insomnia, unspecified: Secondary | ICD-10-CM

## 2011-01-14 DIAGNOSIS — J208 Acute bronchitis due to other specified organisms: Secondary | ICD-10-CM | POA: Insufficient documentation

## 2011-01-14 MED ORDER — METHYLPREDNISOLONE ACETATE 80 MG/ML IJ SUSP
80.0000 mg | Freq: Once | INTRAMUSCULAR | Status: AC
  Administered 2011-01-14: 80 mg via INTRAMUSCULAR

## 2011-01-14 MED ORDER — PHENYLEPHRINE HCL 1 % NA SOLN
3.0000 [drp] | Freq: Once | NASAL | Status: AC
  Administered 2011-01-14: 3 [drp] via NASAL

## 2011-01-14 NOTE — Assessment & Plan Note (Signed)
Upper respiratory infection with bronchitis. Probable early sinusitis. This fits the pattern of the widespread respiratory viral syndromes going through our community now. His expectations exceed our ability to treat for "colds" and I talked about supportive medications and the role of antibiotics for him and his wife to discuss. Plan-finished his last remaining 20 mg prednisone tablets by taking one every other day. Nasal nebulizer, Depo-Medrol.

## 2011-01-14 NOTE — Patient Instructions (Addendum)
Neb neo nasal  Depo 105  Ok to use benadryl if needed for drainage  Ok to stop the Mucinex -D and see if you do better without it  Try throat lozenges  Take the last prednisone pills- one every other day till gone.

## 2011-01-14 NOTE — Progress Notes (Signed)
Patient ID: [name], male    DOB: 1931/01/24, 78 y.o.   MRN: [id number]  HPI Last here Decmber 6, 2011. Wive here now. He is going to [place] for second opinion about ? Parkinsons.  He notices that he coughs more, with some sputum, if he lies on right side. No recent antibiotics.  Increased watery nose with Spring pollen.  CXR- 12/25/09- clear, NAD.  Asks refill generic ambien for occasional nonspecific insomnia.   11/17/10- 78 yoM never smoker followed for recurrent bronchitis, complicated by hx urticaria, PAFIB, DM. Wife here Has had flu vaccine. He had a recent upper respiratory infection with watery rhinorrhea headache and malaise because of which she spent a few days in the house and in bed. He took Benadryl and Tylenol. That illness is largely resolved. He still has nasal congestion and some cough producing clear mucus. He has not been needing his rescue inhaler. He asks for a Depo-Medrol injection and we discussed his blood sugar status. He likes benzonatate for cough.  01/14/11- 78 yoM never smoker followed for recurrent bronchitis, complicated by hx urticaria, PAFIB, DM, Chronic insomnia.   Wife here Acute visit-cough; productive-white and thick, sinus drainage, wheezing as well-worse at night Has had flu vaccine. Went to [facility] emergency room on Christmas Eve with malaise. He was treated with continuous nebulizer, intravenous steroids and prednisone taper. The next day he had some fever which is resolved. He complains of persistent postnasal drip, dry cough and malaise. With hard coughing he has had some sharp left lateral chest wall pain. Sleeping sitting up. His raspy breathing noise worries him. He is taking Delsym, Mucinex D. and says antibiotics have not helped. Still on prednisone. For his problem of chronic insomnia-complains he can't sleep. Waiting for prior authorization to renew his Ambien and can't sleep without it.  Review of Systems .see HPI Constitutional:    No-   weight loss, night sweats, fevers, chills, fatigue, lassitude. HEENT:   No-  headaches, difficulty swallowing, tooth/dental problems, sore throat,       No-  sneezing, itching, ear ache,    +nasal congestion, post nasal drip,  CV:  No-   chest pain, orthopnea, PND, swelling in lower extremities, anasarca, or dizziness, palpitations Resp: +   shortness of breath with exertion or at rest.              +   productive cough,  No non-productive cough,  No- coughing up of blood.              No-   change in color of mucus.  No- wheezing.   Skin: No-   rash or lesions. GI:  No-   heartburn, indigestion, abdominal pain, nausea, vomiting, diarrhea,                 change in bowel habits, loss of appetite GU:  MS:  No-   joint pain or swelling.  No- decreased range of motion.  No- back pain. Neuro-     nothing unusual Psych:  No- change in mood or affect. No depression or anxiety.  No memory loss.    Objective:   Physical Exam General- Alert, Oriented, Affect-appropriate, Distress- none acute Skin- rash-none, lesions- none, excoriation- none Lymphadenopathy- none Head- atraumatic            Eyes- Gross vision intact, PERRLA, conjunctivae clear secretions            Ears- Hearing aid  Nose- pale and watery, no-Septal dev, mucus, polyps, erosion, perforation             Throat- Mallampati II , mucosa clear , drainage- none, tonsils- atrophic Neck- flexible , trachea midline, no stridor , thyroid nl, carotid no bruit Chest - symmetrical excursion , unlabored           Heart/CV- RRR , no murmur , no gallop  , no rub, nl s1 s2                           - JVD- none , edema- none, stasis changes- none, varices- none           Lung- crackles right chest, unlabored, wheeze- none, cough- none , dullness-none, rub- none           Chest wall-  Abd- tender-no, distended-no, bowel sounds-present, HSM- no Br/ Gen/ Rectal- Not done, not indicated Extrem- cyanosis- none, clubbing, none,  atrophy- none, strength- nl Neuro- grossly intact to observation, mentation seems a little simplistic

## 2011-01-14 NOTE — Assessment & Plan Note (Signed)
Chronic insomnia. Anticipate refill of Ambien 5 mg for use as needed. Sleep problems are currently worse because he just doesn't feel well.

## 2011-01-15 ENCOUNTER — Other Ambulatory Visit: Payer: Self-pay | Admitting: Internal Medicine

## 2011-01-20 NOTE — Telephone Encounter (Signed)
[employer] # [id number]. Per [employer], Ambien 5mg  has been approved from 11/09/10 through 01/09/12.   Pt notified and has already picked this medication up at the pharmacy.  Nothing further is needed.

## 2011-01-20 NOTE — Telephone Encounter (Signed)
I have not seen any approval/denial as of today-will forward to Lawson Fiscal to assist with this as she and Huntley Dec are working on PA/refills today.

## 2011-01-20 NOTE — Telephone Encounter (Signed)
Katie, have you received approval/denial yet? Please advise, thanks

## 2011-01-30 DIAGNOSIS — I10 Essential (primary) hypertension: Secondary | ICD-10-CM | POA: Diagnosis not present

## 2011-01-30 DIAGNOSIS — J342 Deviated nasal septum: Secondary | ICD-10-CM | POA: Diagnosis not present

## 2011-01-30 DIAGNOSIS — R05 Cough: Secondary | ICD-10-CM | POA: Diagnosis not present

## 2011-01-30 DIAGNOSIS — J310 Chronic rhinitis: Secondary | ICD-10-CM | POA: Diagnosis not present

## 2011-01-30 DIAGNOSIS — K219 Gastro-esophageal reflux disease without esophagitis: Secondary | ICD-10-CM | POA: Diagnosis not present

## 2011-01-30 DIAGNOSIS — E119 Type 2 diabetes mellitus without complications: Secondary | ICD-10-CM | POA: Diagnosis not present

## 2011-01-30 DIAGNOSIS — J343 Hypertrophy of nasal turbinates: Secondary | ICD-10-CM | POA: Diagnosis not present

## 2011-01-30 DIAGNOSIS — R059 Cough, unspecified: Secondary | ICD-10-CM | POA: Diagnosis not present

## 2011-02-03 ENCOUNTER — Ambulatory Visit: Payer: Medicare Other | Admitting: Internal Medicine

## 2011-02-23 DIAGNOSIS — J310 Chronic rhinitis: Secondary | ICD-10-CM | POA: Diagnosis not present

## 2011-02-23 DIAGNOSIS — J342 Deviated nasal septum: Secondary | ICD-10-CM | POA: Diagnosis not present

## 2011-02-23 DIAGNOSIS — J343 Hypertrophy of nasal turbinates: Secondary | ICD-10-CM | POA: Diagnosis not present

## 2011-02-27 DIAGNOSIS — H35059 Retinal neovascularization, unspecified, unspecified eye: Secondary | ICD-10-CM | POA: Diagnosis not present

## 2011-02-27 DIAGNOSIS — H353290 Exudative age-related macular degeneration, unspecified eye, stage unspecified: Secondary | ICD-10-CM | POA: Diagnosis not present

## 2011-03-02 ENCOUNTER — Ambulatory Visit (INDEPENDENT_AMBULATORY_CARE_PROVIDER_SITE_OTHER): Payer: Medicare Other | Admitting: Internal Medicine

## 2011-03-02 ENCOUNTER — Ambulatory Visit (INDEPENDENT_AMBULATORY_CARE_PROVIDER_SITE_OTHER)
Admission: RE | Admit: 2011-03-02 | Discharge: 2011-03-02 | Disposition: A | Discharge status: Home / Self Care | Payer: Medicare Other | Source: Ambulatory Visit | Attending: Internal Medicine | Admitting: Internal Medicine

## 2011-03-02 ENCOUNTER — Encounter: Payer: Self-pay | Admitting: Internal Medicine

## 2011-03-02 ENCOUNTER — Telehealth: Payer: Self-pay | Admitting: Internal Medicine

## 2011-03-02 VITALS — BP 122/64 | HR 87 | Ht 69.0 in | Wt 177.8 lb

## 2011-03-02 DIAGNOSIS — B9789 Other viral agents as the cause of diseases classified elsewhere: Secondary | ICD-10-CM

## 2011-03-02 DIAGNOSIS — J209 Acute bronchitis, unspecified: Secondary | ICD-10-CM

## 2011-03-02 DIAGNOSIS — J45909 Unspecified asthma, uncomplicated: Secondary | ICD-10-CM

## 2011-03-02 DIAGNOSIS — J206 Acute bronchitis due to rhinovirus: Secondary | ICD-10-CM

## 2011-03-02 MED ORDER — LEVALBUTEROL HCL 0.63 MG/3ML IN NEBU
0.6300 mg | INHALATION_SOLUTION | Freq: Once | RESPIRATORY_TRACT | Status: AC
  Administered 2011-03-02: 0.63 mg via RESPIRATORY_TRACT

## 2011-03-02 MED ORDER — METHYLPREDNISOLONE ACETATE 80 MG/ML IJ SUSP
80.0000 mg | Freq: Once | INTRAMUSCULAR | Status: AC
  Administered 2011-03-02: 80 mg via INTRAMUSCULAR

## 2011-03-02 NOTE — Telephone Encounter (Signed)
Spoke with pt. He states could not get tudorza to work right. The window would not change from green to red. I explained to the pt that he needs to take a quick, deep inhalation. He tried this and states that it worked this time. He states nothing further needed at this time.

## 2011-03-02 NOTE — Patient Instructions (Signed)
Order- neb xop              Depo 80   Order- CXR   Dx   Asthma with bronchitis  Sample Tudorza-   1 puff twice daily till used up.

## 2011-03-02 NOTE — Progress Notes (Signed)
Patient ID: [name], male    DOB: 12-26-1931, 78 y.o.   MRN: [id number]  HPI Last here Decmber 6, 2011. Wive here now. He is going to [place] for second opinion about ? Parkinsons.  He notices that he coughs more, with some sputum, if he lies on right side. No recent antibiotics.  Increased watery nose with Spring pollen.  CXR- 12/25/09- clear, NAD.  Asks refill generic ambien for occasional nonspecific insomnia.   11/17/10- 78 yoM never smoker followed for recurrent bronchitis, complicated by hx urticaria, PAFIB, DM. Wife here Has had flu vaccine. He had a recent upper respiratory infection with watery rhinorrhea headache and malaise because of which she spent a few days in the house and in bed. He took Benadryl and Tylenol. That illness is largely resolved. He still has nasal congestion and some cough producing clear mucus. He has not been needing his rescue inhaler. He asks for a Depo-Medrol injection and we discussed his blood sugar status. He likes benzonatate for cough.  01/14/11- 78 yoM never smoker followed for recurrent bronchitis, complicated by hx urticaria, PAFIB, DM, Chronic insomnia.   Wife here Acute visit-cough; productive-white and thick, sinus drainage, wheezing as well-worse at night Has had flu vaccine. Went to [facility] emergency room on Christmas Eve with malaise. He was treated with continuous nebulizer, intravenous steroids and prednisone taper. The next day he had some fever which is resolved. He complains of persistent postnasal drip, dry cough and malaise. With hard coughing he has had some sharp left lateral chest wall pain. Sleeping sitting up. His raspy breathing noise worries him. He is taking Delsym, Mucinex D. and says antibiotics have not helped. Still on prednisone. For his problem of chronic insomnia-complains he can't sleep. Waiting for prior authorization to renew his Ambien and can't sleep without it.  03/02/11-  78 yoM never smoker followed for  recurrent bronchitis, complicated by hx urticaria, PAFIB, DM, Chronic insomnia.  C/O cough getting worse-night time; unable to sleep; ENT 3 weeks ago-was told to use salt water mixture in nose. He has been coughing for 6 weeks. His ENT doctor told him to take Prilosec twice daily and that did seem to help initially. In the last 2 weeks cough is worse again, productive of scant white sputum. Taking benzonatate. Much rhinorrhea. Denies any reflux or choking with food but then admits he swallows bread after pills to make them go down.   Review of Systems-see HPI Constitutional:   No-   weight loss, night sweats, fevers, chills, fatigue, lassitude. HEENT:   No-  headaches, difficulty swallowing, tooth/dental problems, sore throat,       No-  sneezing, itching, ear ache,    +nasal congestion, post nasal drip,  CV:  No-   chest pain, orthopnea, PND, swelling in lower extremities, anasarca, or dizziness, palpitations Resp: +   shortness of breath with exertion or at rest.              +   productive cough,  + non-productive cough,  No- coughing up of blood.              No-   change in color of mucus.  No- wheezing.   Skin: No-   rash or lesions. GI:  No-   heartburn, indigestion, abdominal pain, nausea, vomiting, diarrhea,                 change in bowel habits, loss of appetite GU:  MS:  No-   joint pain or swelling.  Marland Kitchen  No- back pain. Neuro-     nothing unusual Psych:  No- change in mood or affect. No depression or anxiety.  No memory loss.    Objective:   Physical Exam General- Alert, Oriented, Affect-appropriate, Distress- none acute Skin- rash-none, lesions- none, excoriation- none Lymphadenopathy- none Head- atraumatic            Eyes- Gross vision intact, PERRLA, conjunctivae clear secretions            Ears- Hearing aid, HOH            Nose- pale and watery, no-Septal dev, mucus, polyps, erosion, perforation             Throat- Mallampati II , mucosa clear , drainage- none, tonsils-  atrophic Neck- flexible , trachea midline, no stridor , thyroid nl, carotid no bruit Chest - symmetrical excursion , unlabored           Heart/CV- RRR , no murmur , no gallop  , no rub, nl s1 s2                           - JVD- none , edema- none, stasis changes- none, varices- none           Lung- rattling dry cough, bilateral wheeze and crackles , dullness-none, rub- none           Chest wall-  Abd- Br/ Gen/ Rectal- Not done, not indicated Extrem- cyanosis- none, clubbing, none, atrophy- none, strength- nl Neuro- grossly intact to observation,

## 2011-03-03 NOTE — Assessment & Plan Note (Signed)
Subacute bronchitis after initial viral pattern. He is allergic to oxycodone, limiting our options for cough control. Benzonatate is not helping. Plan-chest x-ray, nebulizer, Depo-Medrol, sample Tudorza bronchodilator

## 2011-03-04 ENCOUNTER — Telehealth: Payer: Self-pay | Admitting: Internal Medicine

## 2011-03-04 NOTE — Telephone Encounter (Signed)
CXR- no active process. Old left rib injury is again seen.

## 2011-03-04 NOTE — Telephone Encounter (Signed)
Called and spoke with pt's wife, [name].  [name] requesting cxr results from 03/02/11,  Please advise.  Thanks.  [name] is aware CY out of office this PM and is ok to wait until tomorrow for results.

## 2011-03-04 NOTE — Telephone Encounter (Signed)
Spoke with [name]-aware of results for patients CXR.

## 2011-03-09 ENCOUNTER — Telehealth: Payer: Self-pay | Admitting: Internal Medicine

## 2011-03-09 NOTE — Telephone Encounter (Signed)
Pt c/o increased cough, mucus is clear. Wheezing has also increased. Pt denies an fever, sore throat. SOB is worse with sever coughing spells. Pls advisE. Allergies  Allergen Reactions  . Colchicine Anaphylaxis  . Atorvastatin Other (See Comments)    Joint soreness  . Ceftin Diarrhea  . Codeine Hives  . Erythromycin Diarrhea  . Ezetimibe Other (See Comments)    Joint soreness  . Nitroglycerin Other (See Comments)    lowers blood pressure   . Oxycodone-Acetaminophen Hives  . Rosuvastatin Other (See Comments)    Joint soreness  . Simvastatin Other (See Comments)    Joint soreness  . Vantin Other (See Comments)    Doesn't remember  . Contrast Media (Iodinated Diagnostic Agents) Nausea And Vomiting and Rash  . Tetanus Toxoid Rash

## 2011-03-09 NOTE — Telephone Encounter (Signed)
Recommend throat lozenges and Delsym otc cough syrup.

## 2011-03-09 NOTE — Telephone Encounter (Signed)
Pt advised. Leyton Magoon, CMA  

## 2011-03-14 ENCOUNTER — Other Ambulatory Visit: Payer: Self-pay | Admitting: Internal Medicine

## 2011-03-16 NOTE — Telephone Encounter (Signed)
Prescription sent to pharmacy.

## 2011-03-24 DIAGNOSIS — H35059 Retinal neovascularization, unspecified, unspecified eye: Secondary | ICD-10-CM | POA: Diagnosis not present

## 2011-03-24 DIAGNOSIS — H353290 Exudative age-related macular degeneration, unspecified eye, stage unspecified: Secondary | ICD-10-CM | POA: Diagnosis not present

## 2011-03-24 DIAGNOSIS — H3530 Unspecified macular degeneration: Secondary | ICD-10-CM | POA: Diagnosis not present

## 2011-04-02 ENCOUNTER — Other Ambulatory Visit (INDEPENDENT_AMBULATORY_CARE_PROVIDER_SITE_OTHER): Payer: Medicare Other

## 2011-04-02 DIAGNOSIS — E119 Type 2 diabetes mellitus without complications: Secondary | ICD-10-CM | POA: Diagnosis not present

## 2011-04-02 DIAGNOSIS — E785 Hyperlipidemia, unspecified: Secondary | ICD-10-CM | POA: Diagnosis not present

## 2011-04-02 LAB — BASIC METABOLIC PANEL
BUN: 17 mg/dL (ref 6–23)
Chloride: 97 mEq/L (ref 96–112)
Potassium: 4 mEq/L (ref 3.5–5.1)
Sodium: 137 mEq/L (ref 135–145)

## 2011-04-02 LAB — HEPATIC FUNCTION PANEL
Albumin: 3.6 g/dL (ref 3.5–5.2)
Alkaline Phosphatase: 78 U/L (ref 39–117)
Total Protein: 6.5 g/dL (ref 6.0–8.3)

## 2011-04-02 LAB — LIPID PANEL
Cholesterol: 218 mg/dL — ABNORMAL HIGH (ref 0–200)
HDL: 41.7 mg/dL (ref >39.00)
Total CHOL/HDL Ratio: 5
Triglycerides: 122 mg/dL (ref 0.0–149.0)
VLDL: 24.4 mg/dL (ref 0.0–40.0)

## 2011-04-02 LAB — LDL CHOLESTEROL, DIRECT: Direct LDL: 153.9 mg/dL

## 2011-04-02 LAB — HEMOGLOBIN A1C: Hgb A1c MFr Bld: 6.9 % — ABNORMAL HIGH (ref 4.6–6.5)

## 2011-04-03 DIAGNOSIS — I10 Essential (primary) hypertension: Secondary | ICD-10-CM | POA: Diagnosis not present

## 2011-04-03 DIAGNOSIS — E119 Type 2 diabetes mellitus without complications: Secondary | ICD-10-CM | POA: Diagnosis not present

## 2011-04-03 DIAGNOSIS — R05 Cough: Secondary | ICD-10-CM | POA: Diagnosis not present

## 2011-04-03 DIAGNOSIS — R059 Cough, unspecified: Secondary | ICD-10-CM | POA: Diagnosis not present

## 2011-04-03 DIAGNOSIS — Z23 Encounter for immunization: Secondary | ICD-10-CM | POA: Diagnosis not present

## 2011-04-07 DIAGNOSIS — E1139 Type 2 diabetes mellitus with other diabetic ophthalmic complication: Secondary | ICD-10-CM | POA: Diagnosis not present

## 2011-04-07 DIAGNOSIS — H353290 Exudative age-related macular degeneration, unspecified eye, stage unspecified: Secondary | ICD-10-CM | POA: Diagnosis not present

## 2011-04-07 DIAGNOSIS — E119 Type 2 diabetes mellitus without complications: Secondary | ICD-10-CM | POA: Diagnosis not present

## 2011-04-07 DIAGNOSIS — H35059 Retinal neovascularization, unspecified, unspecified eye: Secondary | ICD-10-CM | POA: Diagnosis not present

## 2011-04-07 DIAGNOSIS — E11329 Type 2 diabetes mellitus with mild nonproliferative diabetic retinopathy without macular edema: Secondary | ICD-10-CM | POA: Diagnosis not present

## 2011-04-07 DIAGNOSIS — E113299 Type 2 diabetes mellitus with mild nonproliferative diabetic retinopathy without macular edema, unspecified eye: Secondary | ICD-10-CM | POA: Insufficient documentation

## 2011-04-10 ENCOUNTER — Other Ambulatory Visit: Payer: Self-pay | Admitting: Internal Medicine

## 2011-04-17 ENCOUNTER — Other Ambulatory Visit: Payer: Self-pay | Admitting: Internal Medicine

## 2011-04-20 NOTE — Telephone Encounter (Signed)
Patients wife called stating that this refill was a mistake. Patient is now seeing Dr. Pete Glatter.

## 2011-04-20 NOTE — Telephone Encounter (Signed)
Left message on voicemail for patient to return call, reason for call: ? If patient is seeing a doctor at the facility where he lives, if yes per Dr.Hopper patient needs to get refills from that doctor

## 2011-05-18 ENCOUNTER — Ambulatory Visit: Payer: Medicare Other | Admitting: Internal Medicine

## 2011-06-08 ENCOUNTER — Other Ambulatory Visit: Payer: Self-pay | Admitting: Internal Medicine

## 2011-06-18 ENCOUNTER — Other Ambulatory Visit: Payer: Self-pay | Admitting: Internal Medicine

## 2011-06-19 DIAGNOSIS — H35059 Retinal neovascularization, unspecified, unspecified eye: Secondary | ICD-10-CM | POA: Diagnosis not present

## 2011-06-19 DIAGNOSIS — H353290 Exudative age-related macular degeneration, unspecified eye, stage unspecified: Secondary | ICD-10-CM | POA: Diagnosis not present

## 2011-07-11 ENCOUNTER — Other Ambulatory Visit: Payer: Self-pay | Admitting: Internal Medicine

## 2011-07-13 ENCOUNTER — Ambulatory Visit: Payer: Medicare Other | Admitting: Internal Medicine

## 2011-07-13 NOTE — Telephone Encounter (Signed)
TSH 244.9 

## 2011-07-20 DIAGNOSIS — Z8546 Personal history of malignant neoplasm of prostate: Secondary | ICD-10-CM | POA: Diagnosis not present

## 2011-07-20 DIAGNOSIS — R351 Nocturia: Secondary | ICD-10-CM | POA: Diagnosis not present

## 2011-07-20 DIAGNOSIS — N529 Male erectile dysfunction, unspecified: Secondary | ICD-10-CM | POA: Diagnosis not present

## 2011-07-28 DIAGNOSIS — H35059 Retinal neovascularization, unspecified, unspecified eye: Secondary | ICD-10-CM | POA: Diagnosis not present

## 2011-07-28 DIAGNOSIS — H353290 Exudative age-related macular degeneration, unspecified eye, stage unspecified: Secondary | ICD-10-CM | POA: Insufficient documentation

## 2011-08-07 DIAGNOSIS — R079 Chest pain, unspecified: Secondary | ICD-10-CM | POA: Diagnosis not present

## 2011-08-07 DIAGNOSIS — E7800 Pure hypercholesterolemia, unspecified: Secondary | ICD-10-CM | POA: Diagnosis not present

## 2011-08-07 DIAGNOSIS — I10 Essential (primary) hypertension: Secondary | ICD-10-CM | POA: Diagnosis not present

## 2011-08-07 DIAGNOSIS — Z79899 Other long term (current) drug therapy: Secondary | ICD-10-CM | POA: Diagnosis not present

## 2011-08-07 DIAGNOSIS — E119 Type 2 diabetes mellitus without complications: Secondary | ICD-10-CM | POA: Diagnosis not present

## 2011-08-07 DIAGNOSIS — E039 Hypothyroidism, unspecified: Secondary | ICD-10-CM | POA: Diagnosis not present

## 2011-08-10 DIAGNOSIS — Z79899 Other long term (current) drug therapy: Secondary | ICD-10-CM | POA: Diagnosis not present

## 2011-08-10 DIAGNOSIS — E7800 Pure hypercholesterolemia, unspecified: Secondary | ICD-10-CM | POA: Diagnosis not present

## 2011-08-10 DIAGNOSIS — I10 Essential (primary) hypertension: Secondary | ICD-10-CM | POA: Diagnosis not present

## 2011-08-10 DIAGNOSIS — E039 Hypothyroidism, unspecified: Secondary | ICD-10-CM | POA: Diagnosis not present

## 2011-08-10 DIAGNOSIS — E119 Type 2 diabetes mellitus without complications: Secondary | ICD-10-CM | POA: Diagnosis not present

## 2011-08-11 DIAGNOSIS — H353290 Exudative age-related macular degeneration, unspecified eye, stage unspecified: Secondary | ICD-10-CM | POA: Diagnosis not present

## 2011-08-13 ENCOUNTER — Other Ambulatory Visit: Payer: Self-pay | Admitting: Internal Medicine

## 2011-08-20 DIAGNOSIS — D235 Other benign neoplasm of skin of trunk: Secondary | ICD-10-CM | POA: Diagnosis not present

## 2011-08-20 DIAGNOSIS — D239 Other benign neoplasm of skin, unspecified: Secondary | ICD-10-CM | POA: Diagnosis not present

## 2011-09-18 DIAGNOSIS — E039 Hypothyroidism, unspecified: Secondary | ICD-10-CM | POA: Diagnosis not present

## 2011-09-18 DIAGNOSIS — E119 Type 2 diabetes mellitus without complications: Secondary | ICD-10-CM | POA: Diagnosis not present

## 2011-10-02 DIAGNOSIS — H3520 Other non-diabetic proliferative retinopathy, unspecified eye: Secondary | ICD-10-CM | POA: Diagnosis not present

## 2011-10-02 DIAGNOSIS — E1139 Type 2 diabetes mellitus with other diabetic ophthalmic complication: Secondary | ICD-10-CM | POA: Diagnosis not present

## 2011-10-02 DIAGNOSIS — H353290 Exudative age-related macular degeneration, unspecified eye, stage unspecified: Secondary | ICD-10-CM | POA: Diagnosis not present

## 2011-10-02 DIAGNOSIS — H35059 Retinal neovascularization, unspecified, unspecified eye: Secondary | ICD-10-CM | POA: Diagnosis not present

## 2011-10-02 DIAGNOSIS — E119 Type 2 diabetes mellitus without complications: Secondary | ICD-10-CM | POA: Diagnosis not present

## 2011-10-02 DIAGNOSIS — E11329 Type 2 diabetes mellitus with mild nonproliferative diabetic retinopathy without macular edema: Secondary | ICD-10-CM | POA: Diagnosis not present

## 2011-10-02 DIAGNOSIS — H3530 Unspecified macular degeneration: Secondary | ICD-10-CM | POA: Diagnosis not present

## 2011-10-08 ENCOUNTER — Telehealth: Payer: Self-pay | Admitting: Internal Medicine

## 2011-10-08 ENCOUNTER — Other Ambulatory Visit: Payer: Self-pay | Admitting: Internal Medicine

## 2011-10-08 NOTE — Telephone Encounter (Signed)
Ok to refill 

## 2011-10-08 NOTE — Telephone Encounter (Signed)
Please advise if okay to refill. Thanks.  

## 2011-10-08 NOTE — Telephone Encounter (Signed)
Please advise if okay to refill ambien 5 mg, thanks! Pt last given rx 01/02/11 #30 with 5 fills Last ov 03/02/11 Next ov 10/14/11

## 2011-10-08 NOTE — Telephone Encounter (Signed)
Per CY okay to refill x 5. I have called this to pharmacy. Left message at number that this has been taken care of.

## 2011-10-14 ENCOUNTER — Ambulatory Visit (INDEPENDENT_AMBULATORY_CARE_PROVIDER_SITE_OTHER): Payer: Medicare Other

## 2011-10-14 ENCOUNTER — Ambulatory Visit: Payer: Medicare Other | Admitting: Internal Medicine

## 2011-10-14 DIAGNOSIS — Z23 Encounter for immunization: Secondary | ICD-10-CM

## 2011-10-15 DIAGNOSIS — Z23 Encounter for immunization: Secondary | ICD-10-CM | POA: Diagnosis not present

## 2011-10-20 DIAGNOSIS — H353290 Exudative age-related macular degeneration, unspecified eye, stage unspecified: Secondary | ICD-10-CM | POA: Diagnosis not present

## 2011-10-23 DIAGNOSIS — H43399 Other vitreous opacities, unspecified eye: Secondary | ICD-10-CM | POA: Diagnosis not present

## 2011-10-23 DIAGNOSIS — H353290 Exudative age-related macular degeneration, unspecified eye, stage unspecified: Secondary | ICD-10-CM | POA: Diagnosis not present

## 2011-10-23 DIAGNOSIS — E119 Type 2 diabetes mellitus without complications: Secondary | ICD-10-CM | POA: Diagnosis not present

## 2011-10-23 DIAGNOSIS — H43811 Vitreous degeneration, right eye: Secondary | ICD-10-CM | POA: Insufficient documentation

## 2011-10-23 DIAGNOSIS — E1139 Type 2 diabetes mellitus with other diabetic ophthalmic complication: Secondary | ICD-10-CM | POA: Diagnosis not present

## 2011-10-23 DIAGNOSIS — E11329 Type 2 diabetes mellitus with mild nonproliferative diabetic retinopathy without macular edema: Secondary | ICD-10-CM | POA: Diagnosis not present

## 2011-10-23 DIAGNOSIS — H3520 Other non-diabetic proliferative retinopathy, unspecified eye: Secondary | ICD-10-CM | POA: Diagnosis not present

## 2011-10-23 DIAGNOSIS — H35359 Cystoid macular degeneration, unspecified eye: Secondary | ICD-10-CM | POA: Diagnosis not present

## 2011-10-23 DIAGNOSIS — H3530 Unspecified macular degeneration: Secondary | ICD-10-CM | POA: Diagnosis not present

## 2011-10-23 DIAGNOSIS — H43819 Vitreous degeneration, unspecified eye: Secondary | ICD-10-CM | POA: Diagnosis not present

## 2011-10-27 DIAGNOSIS — H3530 Unspecified macular degeneration: Secondary | ICD-10-CM | POA: Diagnosis not present

## 2011-10-27 DIAGNOSIS — L0293 Carbuncle, unspecified: Secondary | ICD-10-CM | POA: Diagnosis not present

## 2011-10-27 DIAGNOSIS — H353290 Exudative age-related macular degeneration, unspecified eye, stage unspecified: Secondary | ICD-10-CM | POA: Diagnosis not present

## 2011-10-27 DIAGNOSIS — E119 Type 2 diabetes mellitus without complications: Secondary | ICD-10-CM | POA: Diagnosis not present

## 2011-10-27 DIAGNOSIS — H3520 Other non-diabetic proliferative retinopathy, unspecified eye: Secondary | ICD-10-CM | POA: Diagnosis not present

## 2011-10-27 DIAGNOSIS — E11329 Type 2 diabetes mellitus with mild nonproliferative diabetic retinopathy without macular edema: Secondary | ICD-10-CM | POA: Diagnosis not present

## 2011-10-27 DIAGNOSIS — L0292 Furuncle, unspecified: Secondary | ICD-10-CM | POA: Diagnosis not present

## 2011-10-27 DIAGNOSIS — E1139 Type 2 diabetes mellitus with other diabetic ophthalmic complication: Secondary | ICD-10-CM | POA: Diagnosis not present

## 2011-10-27 DIAGNOSIS — H35059 Retinal neovascularization, unspecified, unspecified eye: Secondary | ICD-10-CM | POA: Diagnosis not present

## 2011-10-27 DIAGNOSIS — H43819 Vitreous degeneration, unspecified eye: Secondary | ICD-10-CM | POA: Diagnosis not present

## 2011-11-18 DIAGNOSIS — E039 Hypothyroidism, unspecified: Secondary | ICD-10-CM | POA: Diagnosis not present

## 2011-11-23 ENCOUNTER — Ambulatory Visit: Payer: Medicare Other | Admitting: Internal Medicine

## 2011-12-01 DIAGNOSIS — H3520 Other non-diabetic proliferative retinopathy, unspecified eye: Secondary | ICD-10-CM | POA: Diagnosis not present

## 2011-12-01 DIAGNOSIS — H35059 Retinal neovascularization, unspecified, unspecified eye: Secondary | ICD-10-CM | POA: Diagnosis not present

## 2011-12-01 DIAGNOSIS — E1139 Type 2 diabetes mellitus with other diabetic ophthalmic complication: Secondary | ICD-10-CM | POA: Diagnosis not present

## 2011-12-01 DIAGNOSIS — H353290 Exudative age-related macular degeneration, unspecified eye, stage unspecified: Secondary | ICD-10-CM | POA: Diagnosis not present

## 2011-12-01 DIAGNOSIS — E119 Type 2 diabetes mellitus without complications: Secondary | ICD-10-CM | POA: Diagnosis not present

## 2011-12-01 DIAGNOSIS — H3530 Unspecified macular degeneration: Secondary | ICD-10-CM | POA: Diagnosis not present

## 2011-12-01 DIAGNOSIS — H43819 Vitreous degeneration, unspecified eye: Secondary | ICD-10-CM | POA: Diagnosis not present

## 2011-12-01 DIAGNOSIS — E11329 Type 2 diabetes mellitus with mild nonproliferative diabetic retinopathy without macular edema: Secondary | ICD-10-CM | POA: Diagnosis not present

## 2011-12-14 ENCOUNTER — Other Ambulatory Visit: Payer: Self-pay | Admitting: Internal Medicine

## 2011-12-15 NOTE — Telephone Encounter (Signed)
a1c 250.00  

## 2011-12-16 DIAGNOSIS — E039 Hypothyroidism, unspecified: Secondary | ICD-10-CM | POA: Diagnosis not present

## 2011-12-18 DIAGNOSIS — H3520 Other non-diabetic proliferative retinopathy, unspecified eye: Secondary | ICD-10-CM | POA: Diagnosis not present

## 2011-12-18 DIAGNOSIS — H3530 Unspecified macular degeneration: Secondary | ICD-10-CM | POA: Diagnosis not present

## 2011-12-18 DIAGNOSIS — E1139 Type 2 diabetes mellitus with other diabetic ophthalmic complication: Secondary | ICD-10-CM | POA: Diagnosis not present

## 2011-12-18 DIAGNOSIS — H353290 Exudative age-related macular degeneration, unspecified eye, stage unspecified: Secondary | ICD-10-CM | POA: Diagnosis not present

## 2011-12-18 DIAGNOSIS — H35059 Retinal neovascularization, unspecified, unspecified eye: Secondary | ICD-10-CM | POA: Diagnosis not present

## 2011-12-18 DIAGNOSIS — E11329 Type 2 diabetes mellitus with mild nonproliferative diabetic retinopathy without macular edema: Secondary | ICD-10-CM | POA: Diagnosis not present

## 2011-12-18 DIAGNOSIS — E119 Type 2 diabetes mellitus without complications: Secondary | ICD-10-CM | POA: Diagnosis not present

## 2011-12-18 DIAGNOSIS — H43819 Vitreous degeneration, unspecified eye: Secondary | ICD-10-CM | POA: Diagnosis not present

## 2011-12-19 ENCOUNTER — Other Ambulatory Visit: Payer: Self-pay | Admitting: Internal Medicine

## 2011-12-21 NOTE — Telephone Encounter (Signed)
Patient needs to schedule a CPX  

## 2012-01-11 ENCOUNTER — Ambulatory Visit: Payer: Medicare Other | Admitting: Internal Medicine

## 2012-01-18 DIAGNOSIS — E039 Hypothyroidism, unspecified: Secondary | ICD-10-CM | POA: Diagnosis not present

## 2012-01-26 DIAGNOSIS — R351 Nocturia: Secondary | ICD-10-CM | POA: Diagnosis not present

## 2012-02-09 DIAGNOSIS — Z79899 Other long term (current) drug therapy: Secondary | ICD-10-CM | POA: Diagnosis not present

## 2012-02-09 DIAGNOSIS — Z0000 Encounter for general adult medical examination without abnormal findings: Secondary | ICD-10-CM | POA: Diagnosis not present

## 2012-02-09 DIAGNOSIS — E119 Type 2 diabetes mellitus without complications: Secondary | ICD-10-CM | POA: Diagnosis not present

## 2012-02-09 DIAGNOSIS — E7800 Pure hypercholesterolemia, unspecified: Secondary | ICD-10-CM | POA: Diagnosis not present

## 2012-02-09 DIAGNOSIS — I10 Essential (primary) hypertension: Secondary | ICD-10-CM | POA: Diagnosis not present

## 2012-02-09 DIAGNOSIS — Z23 Encounter for immunization: Secondary | ICD-10-CM | POA: Diagnosis not present

## 2012-02-09 DIAGNOSIS — E039 Hypothyroidism, unspecified: Secondary | ICD-10-CM | POA: Diagnosis not present

## 2012-02-15 DIAGNOSIS — I10 Essential (primary) hypertension: Secondary | ICD-10-CM | POA: Diagnosis not present

## 2012-02-15 DIAGNOSIS — E119 Type 2 diabetes mellitus without complications: Secondary | ICD-10-CM | POA: Diagnosis not present

## 2012-02-15 DIAGNOSIS — E7800 Pure hypercholesterolemia, unspecified: Secondary | ICD-10-CM | POA: Diagnosis not present

## 2012-02-15 DIAGNOSIS — Z0000 Encounter for general adult medical examination without abnormal findings: Secondary | ICD-10-CM | POA: Diagnosis not present

## 2012-02-15 DIAGNOSIS — E039 Hypothyroidism, unspecified: Secondary | ICD-10-CM | POA: Diagnosis not present

## 2012-02-15 DIAGNOSIS — Z79899 Other long term (current) drug therapy: Secondary | ICD-10-CM | POA: Diagnosis not present

## 2012-02-19 DIAGNOSIS — E119 Type 2 diabetes mellitus without complications: Secondary | ICD-10-CM | POA: Diagnosis not present

## 2012-02-19 DIAGNOSIS — H35059 Retinal neovascularization, unspecified, unspecified eye: Secondary | ICD-10-CM | POA: Diagnosis not present

## 2012-02-19 DIAGNOSIS — H353290 Exudative age-related macular degeneration, unspecified eye, stage unspecified: Secondary | ICD-10-CM | POA: Diagnosis not present

## 2012-02-19 DIAGNOSIS — E11329 Type 2 diabetes mellitus with mild nonproliferative diabetic retinopathy without macular edema: Secondary | ICD-10-CM | POA: Diagnosis not present

## 2012-02-19 DIAGNOSIS — H3530 Unspecified macular degeneration: Secondary | ICD-10-CM | POA: Diagnosis not present

## 2012-02-19 DIAGNOSIS — H43819 Vitreous degeneration, unspecified eye: Secondary | ICD-10-CM | POA: Diagnosis not present

## 2012-02-19 DIAGNOSIS — H3520 Other non-diabetic proliferative retinopathy, unspecified eye: Secondary | ICD-10-CM | POA: Diagnosis not present

## 2012-02-19 DIAGNOSIS — E1139 Type 2 diabetes mellitus with other diabetic ophthalmic complication: Secondary | ICD-10-CM | POA: Diagnosis not present

## 2012-03-08 DIAGNOSIS — H43819 Vitreous degeneration, unspecified eye: Secondary | ICD-10-CM | POA: Diagnosis not present

## 2012-03-08 DIAGNOSIS — H3520 Other non-diabetic proliferative retinopathy, unspecified eye: Secondary | ICD-10-CM | POA: Diagnosis not present

## 2012-03-08 DIAGNOSIS — E11329 Type 2 diabetes mellitus with mild nonproliferative diabetic retinopathy without macular edema: Secondary | ICD-10-CM | POA: Diagnosis not present

## 2012-03-08 DIAGNOSIS — E119 Type 2 diabetes mellitus without complications: Secondary | ICD-10-CM | POA: Diagnosis not present

## 2012-03-08 DIAGNOSIS — H353290 Exudative age-related macular degeneration, unspecified eye, stage unspecified: Secondary | ICD-10-CM | POA: Diagnosis not present

## 2012-03-08 DIAGNOSIS — H35059 Retinal neovascularization, unspecified, unspecified eye: Secondary | ICD-10-CM | POA: Diagnosis not present

## 2012-03-08 DIAGNOSIS — E1139 Type 2 diabetes mellitus with other diabetic ophthalmic complication: Secondary | ICD-10-CM | POA: Diagnosis not present

## 2012-03-08 DIAGNOSIS — H3530 Unspecified macular degeneration: Secondary | ICD-10-CM | POA: Diagnosis not present

## 2012-03-14 DIAGNOSIS — E039 Hypothyroidism, unspecified: Secondary | ICD-10-CM | POA: Diagnosis not present

## 2012-03-30 ENCOUNTER — Telehealth: Payer: Self-pay | Admitting: Internal Medicine

## 2012-03-30 MED ORDER — ZOLPIDEM TARTRATE 5 MG PO TABS: 5.0000 mg | ORAL_TABLET | Freq: Every evening | ORAL | Status: DC | PRN

## 2012-03-30 NOTE — Telephone Encounter (Signed)
Returning call.

## 2012-03-30 NOTE — Telephone Encounter (Signed)
One refill has been verbally called into pharmacy with note one Rx for pt to contact our office for appointment to receive further refills.  AMBIEN 5MG  TAKE 1 QHS PRN. #30 ZERO REFILLS. Patient aware that he needs to call us soon to schedule appt.

## 2012-03-30 NOTE — Telephone Encounter (Signed)
LMOM x 1  Pt is requesting Ambien to be called into pharm. Patient has not been seen since 03/02/11 Last Rxd 09/2011 with 5 refills.   Pt has Cx last appts with Dr Maple Hudson since 02/2011

## 2012-03-30 NOTE — Telephone Encounter (Signed)
lmomtcb x1 for pt 

## 2012-04-15 DIAGNOSIS — H3520 Other non-diabetic proliferative retinopathy, unspecified eye: Secondary | ICD-10-CM | POA: Diagnosis not present

## 2012-04-15 DIAGNOSIS — E1139 Type 2 diabetes mellitus with other diabetic ophthalmic complication: Secondary | ICD-10-CM | POA: Diagnosis not present

## 2012-04-15 DIAGNOSIS — H35059 Retinal neovascularization, unspecified, unspecified eye: Secondary | ICD-10-CM | POA: Diagnosis not present

## 2012-04-15 DIAGNOSIS — H353290 Exudative age-related macular degeneration, unspecified eye, stage unspecified: Secondary | ICD-10-CM | POA: Diagnosis not present

## 2012-04-15 DIAGNOSIS — E119 Type 2 diabetes mellitus without complications: Secondary | ICD-10-CM | POA: Diagnosis not present

## 2012-04-15 DIAGNOSIS — E11329 Type 2 diabetes mellitus with mild nonproliferative diabetic retinopathy without macular edema: Secondary | ICD-10-CM | POA: Diagnosis not present

## 2012-04-15 DIAGNOSIS — H3530 Unspecified macular degeneration: Secondary | ICD-10-CM | POA: Diagnosis not present

## 2012-04-15 DIAGNOSIS — H43819 Vitreous degeneration, unspecified eye: Secondary | ICD-10-CM | POA: Diagnosis not present

## 2012-04-18 ENCOUNTER — Other Ambulatory Visit: Payer: Self-pay | Admitting: Gastroenterology

## 2012-04-18 DIAGNOSIS — Z09 Encounter for follow-up examination after completed treatment for conditions other than malignant neoplasm: Secondary | ICD-10-CM | POA: Diagnosis not present

## 2012-04-18 DIAGNOSIS — Z86010 Personal history of colonic polyps: Secondary | ICD-10-CM | POA: Diagnosis not present

## 2012-04-18 DIAGNOSIS — D126 Benign neoplasm of colon, unspecified: Secondary | ICD-10-CM | POA: Diagnosis not present

## 2012-04-18 DIAGNOSIS — K5730 Diverticulosis of large intestine without perforation or abscess without bleeding: Secondary | ICD-10-CM | POA: Diagnosis not present

## 2012-05-10 DIAGNOSIS — E119 Type 2 diabetes mellitus without complications: Secondary | ICD-10-CM | POA: Diagnosis not present

## 2012-05-10 DIAGNOSIS — E11329 Type 2 diabetes mellitus with mild nonproliferative diabetic retinopathy without macular edema: Secondary | ICD-10-CM | POA: Diagnosis not present

## 2012-05-10 DIAGNOSIS — H43819 Vitreous degeneration, unspecified eye: Secondary | ICD-10-CM | POA: Diagnosis not present

## 2012-05-10 DIAGNOSIS — H35059 Retinal neovascularization, unspecified, unspecified eye: Secondary | ICD-10-CM | POA: Diagnosis not present

## 2012-05-10 DIAGNOSIS — H353290 Exudative age-related macular degeneration, unspecified eye, stage unspecified: Secondary | ICD-10-CM | POA: Diagnosis not present

## 2012-05-10 DIAGNOSIS — H3520 Other non-diabetic proliferative retinopathy, unspecified eye: Secondary | ICD-10-CM | POA: Diagnosis not present

## 2012-05-10 DIAGNOSIS — E1139 Type 2 diabetes mellitus with other diabetic ophthalmic complication: Secondary | ICD-10-CM | POA: Diagnosis not present

## 2012-05-10 DIAGNOSIS — H3530 Unspecified macular degeneration: Secondary | ICD-10-CM | POA: Diagnosis not present

## 2012-05-30 ENCOUNTER — Ambulatory Visit (INDEPENDENT_AMBULATORY_CARE_PROVIDER_SITE_OTHER): Payer: Medicare Other | Admitting: Internal Medicine

## 2012-05-30 ENCOUNTER — Encounter: Payer: Self-pay | Admitting: Internal Medicine

## 2012-05-30 VITALS — BP 110/64 | HR 65 | Ht 69.0 in | Wt 175.8 lb

## 2012-05-30 DIAGNOSIS — J209 Acute bronchitis, unspecified: Secondary | ICD-10-CM

## 2012-05-30 DIAGNOSIS — J309 Allergic rhinitis, unspecified: Secondary | ICD-10-CM

## 2012-05-30 DIAGNOSIS — J3089 Other allergic rhinitis: Secondary | ICD-10-CM

## 2012-05-30 DIAGNOSIS — I4891 Unspecified atrial fibrillation: Secondary | ICD-10-CM | POA: Diagnosis not present

## 2012-05-30 DIAGNOSIS — B9789 Other viral agents as the cause of diseases classified elsewhere: Secondary | ICD-10-CM

## 2012-05-30 DIAGNOSIS — J206 Acute bronchitis due to rhinovirus: Secondary | ICD-10-CM

## 2012-05-30 NOTE — Progress Notes (Signed)
Patient ID: [name], male    DOB: Sep 11, 1931, 79 y.o.   MRN: [id number]  HPI Last here Decmber 6, 2011. Wive here now. He is going to [place] for second opinion about ? Parkinsons.  He notices that he coughs more, with some sputum, if he lies on right side. No recent antibiotics.  Increased watery nose with Spring pollen.  CXR- 12/25/09- clear, NAD.  Asks refill generic ambien for occasional nonspecific insomnia.   11/17/10- 78 yoM never smoker followed for recurrent bronchitis, complicated by hx urticaria, PAFIB, DM. Wife here Has had flu vaccine. He had a recent upper respiratory infection with watery rhinorrhea headache and malaise because of which she spent a few days in the house and in bed. He took Benadryl and Tylenol. That illness is largely resolved. He still has nasal congestion and some cough producing clear mucus. He has not been needing his rescue inhaler. He asks for a Depo-Medrol injection and we discussed his blood sugar status. He likes benzonatate for cough.  01/14/11- 78 yoM never smoker followed for recurrent bronchitis, complicated by hx urticaria, PAFIB, DM, Chronic insomnia.   Wife here Acute visit-cough; productive-white and thick, sinus drainage, wheezing as well-worse at night Has had flu vaccine. Went to [facility] emergency room on Christmas Eve with malaise. He was treated with continuous nebulizer, intravenous steroids and prednisone taper. The next day he had some fever which is resolved. He complains of persistent postnasal drip, dry cough and malaise. With hard coughing he has had some sharp left lateral chest wall pain. Sleeping sitting up. His raspy breathing noise worries him. He is taking Delsym, Mucinex D. and says antibiotics have not helped. Still on prednisone. For his problem of chronic insomnia-complains he can't sleep. Waiting for prior authorization to renew his Ambien and can't sleep without it.  03/02/11-  78 yoM never smoker followed for  recurrent bronchitis, complicated by hx urticaria, PAFIB, DM, Chronic insomnia.  C/O cough getting worse-night time; unable to sleep; ENT 3 weeks ago-was told to use salt water mixture in nose. He has been coughing for 6 weeks. His ENT doctor told him to take Prilosec twice daily and that did seem to help initially. In the last 2 weeks cough is worse again, productive of scant white sputum. Taking benzonatate. Much rhinorrhea. Denies any reflux or choking with food but then admits he swallows bread after pills to make them go down.  05/30/12-  80 yoM never smoker followed for recurrent bronchitis, complicated by hx urticaria, PAFIB, DM, Chronic insomnia.  Wife here FOLLOWS FOR: sinus drainage and slight cough at times; Denies any wheezing or  SOB. Scheduled f/u without acute concerns. No urticaria. Some pndrip and throat clearing. Little cough and no wheeze. No concerns about meds or pollen season.  Review of Systems-see HPI Constitutional:   No-   weight loss, night sweats, fevers, chills, fatigue, lassitude. HEENT:   No-  headaches, difficulty swallowing, tooth/dental problems, sore throat,       No-  sneezing, itching, ear ache,    +nasal congestion, +post nasal drip,  CV:  No-   chest pain, orthopnea, PND, swelling in lower extremities, anasarca, or dizziness, palpitations Resp: +   shortness of breath with exertion or at rest.              No- productive cough,  + non-productive cough,  No- coughing up of blood.  No-   change in color of mucus.  No- wheezing.   Skin: No-   rash or lesions. GI:  No-   heartburn, indigestion, abdominal pain, nausea, vomiting, diarrhea,                 change in bowel habits, loss of appetite GU:  MS:  No-   joint pain or swelling.  Marland Kitchen  No- back pain. Neuro-     nothing unusual Psych:  No- change in mood or affect. No depression or anxiety.  No memory loss.   Objective:   Physical Exam General- Alert, Oriented, Affect-appropriate, Distress-  none acute Skin- rash-none, lesions- none, excoriation- none Lymphadenopathy- none Head- atraumatic            Eyes- Gross vision intact, PERRLA, conjunctivae clear secretions            Ears- Hearing aid, HOH            Nose- pale and watery, no-Septal dev, mucus, polyps, erosion, perforation             Throat- Mallampati II , mucosa clear , drainage- none, tonsils- atrophic Neck- flexible , trachea midline, no stridor , thyroid nl, carotid no bruit Chest - symmetrical excursion , unlabored           Heart/CV- RRR , no murmur , no gallop  , no rub, nl s1 s2                           - JVD- none , edema- none, stasis changes- none, varices- none           Lung- clear , dullness-none, rub- none           Chest wall-  Abd- Br/ Gen/ Rectal- Not done, not indicated Extrem- cyanosis- none, clubbing, none, atrophy- none, strength- nl Neuro- grossly intact to observation,

## 2012-05-30 NOTE — Patient Instructions (Addendum)
I'm glad you are doing so well. Please call if you need me.

## 2012-05-31 DIAGNOSIS — H353290 Exudative age-related macular degeneration, unspecified eye, stage unspecified: Secondary | ICD-10-CM | POA: Diagnosis not present

## 2012-05-31 DIAGNOSIS — E11329 Type 2 diabetes mellitus with mild nonproliferative diabetic retinopathy without macular edema: Secondary | ICD-10-CM | POA: Diagnosis not present

## 2012-05-31 DIAGNOSIS — H3520 Other non-diabetic proliferative retinopathy, unspecified eye: Secondary | ICD-10-CM | POA: Diagnosis not present

## 2012-05-31 DIAGNOSIS — H35059 Retinal neovascularization, unspecified, unspecified eye: Secondary | ICD-10-CM | POA: Diagnosis not present

## 2012-05-31 DIAGNOSIS — E1139 Type 2 diabetes mellitus with other diabetic ophthalmic complication: Secondary | ICD-10-CM | POA: Diagnosis not present

## 2012-05-31 DIAGNOSIS — H3530 Unspecified macular degeneration: Secondary | ICD-10-CM | POA: Diagnosis not present

## 2012-05-31 DIAGNOSIS — H43819 Vitreous degeneration, unspecified eye: Secondary | ICD-10-CM | POA: Diagnosis not present

## 2012-05-31 DIAGNOSIS — E119 Type 2 diabetes mellitus without complications: Secondary | ICD-10-CM | POA: Diagnosis not present

## 2012-06-10 DIAGNOSIS — M47817 Spondylosis without myelopathy or radiculopathy, lumbosacral region: Secondary | ICD-10-CM | POA: Diagnosis not present

## 2012-06-10 DIAGNOSIS — M48061 Spinal stenosis, lumbar region without neurogenic claudication: Secondary | ICD-10-CM | POA: Diagnosis not present

## 2012-06-11 DIAGNOSIS — J302 Other seasonal allergic rhinitis: Secondary | ICD-10-CM | POA: Insufficient documentation

## 2012-06-11 DIAGNOSIS — J3089 Other allergic rhinitis: Secondary | ICD-10-CM | POA: Insufficient documentation

## 2012-06-11 NOTE — Assessment & Plan Note (Signed)
controlled 

## 2012-06-11 NOTE — Assessment & Plan Note (Signed)
Clinically in sinus rhythm at this visit

## 2012-06-11 NOTE — Assessment & Plan Note (Signed)
He should be able to manage this with saline spray and OTC antihistamine. The 3 of [place] discussed options

## 2012-06-15 DIAGNOSIS — M545 Low back pain, unspecified: Secondary | ICD-10-CM | POA: Diagnosis not present

## 2012-06-15 DIAGNOSIS — M47817 Spondylosis without myelopathy or radiculopathy, lumbosacral region: Secondary | ICD-10-CM | POA: Diagnosis not present

## 2012-06-16 ENCOUNTER — Other Ambulatory Visit: Payer: Self-pay | Admitting: Internal Medicine

## 2012-06-17 NOTE — Telephone Encounter (Signed)
Called pt and left a message to inform him of the need for an appt. Pt has not been seen for by Dr. Alwyn Ren since 2012.

## 2012-06-22 NOTE — Telephone Encounter (Signed)
Spoke with patient's wife, patient is no longer seeing Dr.Hopper, patient is established with Dr.Stoneking. RX was already approved by Dr.Stoneking

## 2012-07-01 DIAGNOSIS — H35059 Retinal neovascularization, unspecified, unspecified eye: Secondary | ICD-10-CM | POA: Diagnosis not present

## 2012-07-01 DIAGNOSIS — H353290 Exudative age-related macular degeneration, unspecified eye, stage unspecified: Secondary | ICD-10-CM | POA: Diagnosis not present

## 2012-07-01 DIAGNOSIS — E11329 Type 2 diabetes mellitus with mild nonproliferative diabetic retinopathy without macular edema: Secondary | ICD-10-CM | POA: Diagnosis not present

## 2012-07-01 DIAGNOSIS — H43819 Vitreous degeneration, unspecified eye: Secondary | ICD-10-CM | POA: Diagnosis not present

## 2012-07-01 DIAGNOSIS — E1139 Type 2 diabetes mellitus with other diabetic ophthalmic complication: Secondary | ICD-10-CM | POA: Diagnosis not present

## 2012-07-01 DIAGNOSIS — E119 Type 2 diabetes mellitus without complications: Secondary | ICD-10-CM | POA: Diagnosis not present

## 2012-07-01 DIAGNOSIS — H3530 Unspecified macular degeneration: Secondary | ICD-10-CM | POA: Diagnosis not present

## 2012-07-01 DIAGNOSIS — H3520 Other non-diabetic proliferative retinopathy, unspecified eye: Secondary | ICD-10-CM | POA: Diagnosis not present

## 2012-08-02 DIAGNOSIS — H3530 Unspecified macular degeneration: Secondary | ICD-10-CM | POA: Diagnosis not present

## 2012-08-02 DIAGNOSIS — H353290 Exudative age-related macular degeneration, unspecified eye, stage unspecified: Secondary | ICD-10-CM | POA: Diagnosis not present

## 2012-08-02 DIAGNOSIS — E119 Type 2 diabetes mellitus without complications: Secondary | ICD-10-CM | POA: Diagnosis not present

## 2012-08-02 DIAGNOSIS — E1139 Type 2 diabetes mellitus with other diabetic ophthalmic complication: Secondary | ICD-10-CM | POA: Diagnosis not present

## 2012-08-02 DIAGNOSIS — E11329 Type 2 diabetes mellitus with mild nonproliferative diabetic retinopathy without macular edema: Secondary | ICD-10-CM | POA: Diagnosis not present

## 2012-08-02 DIAGNOSIS — H35059 Retinal neovascularization, unspecified, unspecified eye: Secondary | ICD-10-CM | POA: Diagnosis not present

## 2012-08-12 DIAGNOSIS — R351 Nocturia: Secondary | ICD-10-CM | POA: Diagnosis not present

## 2012-08-12 DIAGNOSIS — Z8546 Personal history of malignant neoplasm of prostate: Secondary | ICD-10-CM | POA: Diagnosis not present

## 2012-08-22 DIAGNOSIS — Z79899 Other long term (current) drug therapy: Secondary | ICD-10-CM | POA: Diagnosis not present

## 2012-08-22 DIAGNOSIS — E1129 Type 2 diabetes mellitus with other diabetic kidney complication: Secondary | ICD-10-CM | POA: Diagnosis not present

## 2012-08-22 DIAGNOSIS — E7800 Pure hypercholesterolemia, unspecified: Secondary | ICD-10-CM | POA: Diagnosis not present

## 2012-08-22 DIAGNOSIS — I129 Hypertensive chronic kidney disease with stage 1 through stage 4 chronic kidney disease, or unspecified chronic kidney disease: Secondary | ICD-10-CM | POA: Diagnosis not present

## 2012-08-22 DIAGNOSIS — N183 Chronic kidney disease, stage 3 unspecified: Secondary | ICD-10-CM | POA: Diagnosis not present

## 2012-09-13 DIAGNOSIS — H43819 Vitreous degeneration, unspecified eye: Secondary | ICD-10-CM | POA: Diagnosis not present

## 2012-09-13 DIAGNOSIS — H3520 Other non-diabetic proliferative retinopathy, unspecified eye: Secondary | ICD-10-CM | POA: Diagnosis not present

## 2012-09-13 DIAGNOSIS — H3530 Unspecified macular degeneration: Secondary | ICD-10-CM | POA: Diagnosis not present

## 2012-09-13 DIAGNOSIS — H353290 Exudative age-related macular degeneration, unspecified eye, stage unspecified: Secondary | ICD-10-CM | POA: Diagnosis not present

## 2012-09-13 DIAGNOSIS — E1139 Type 2 diabetes mellitus with other diabetic ophthalmic complication: Secondary | ICD-10-CM | POA: Diagnosis not present

## 2012-09-13 DIAGNOSIS — E11329 Type 2 diabetes mellitus with mild nonproliferative diabetic retinopathy without macular edema: Secondary | ICD-10-CM | POA: Diagnosis not present

## 2012-09-13 DIAGNOSIS — H35059 Retinal neovascularization, unspecified, unspecified eye: Secondary | ICD-10-CM | POA: Diagnosis not present

## 2012-09-13 DIAGNOSIS — E119 Type 2 diabetes mellitus without complications: Secondary | ICD-10-CM | POA: Diagnosis not present

## 2012-09-20 DIAGNOSIS — E119 Type 2 diabetes mellitus without complications: Secondary | ICD-10-CM | POA: Diagnosis not present

## 2012-09-20 DIAGNOSIS — H35059 Retinal neovascularization, unspecified, unspecified eye: Secondary | ICD-10-CM | POA: Diagnosis not present

## 2012-09-20 DIAGNOSIS — H43819 Vitreous degeneration, unspecified eye: Secondary | ICD-10-CM | POA: Diagnosis not present

## 2012-09-20 DIAGNOSIS — E1139 Type 2 diabetes mellitus with other diabetic ophthalmic complication: Secondary | ICD-10-CM | POA: Diagnosis not present

## 2012-09-20 DIAGNOSIS — H3530 Unspecified macular degeneration: Secondary | ICD-10-CM | POA: Diagnosis not present

## 2012-09-20 DIAGNOSIS — H3520 Other non-diabetic proliferative retinopathy, unspecified eye: Secondary | ICD-10-CM | POA: Diagnosis not present

## 2012-09-20 DIAGNOSIS — H35359 Cystoid macular degeneration, unspecified eye: Secondary | ICD-10-CM | POA: Diagnosis not present

## 2012-09-20 DIAGNOSIS — H353290 Exudative age-related macular degeneration, unspecified eye, stage unspecified: Secondary | ICD-10-CM | POA: Diagnosis not present

## 2012-09-22 ENCOUNTER — Telehealth: Payer: Self-pay | Admitting: Internal Medicine

## 2012-09-22 MED ORDER — ZOLPIDEM TARTRATE 5 MG PO TABS: 5.0000 mg | ORAL_TABLET | Freq: Every evening | ORAL | Status: DC | PRN

## 2012-09-22 NOTE — Telephone Encounter (Signed)
Ambien 5mg  1 po qhs prn #30 x 5 refills  Refilled (phoned in) to [facility] [address] Pt aware.

## 2012-09-22 NOTE — Telephone Encounter (Signed)
Spoke with pt-- Requesting refill on Ambien-- 1 po qhs prn. Upcoming appt 05/30/2013 CY  [facility] on [address]  Please advise if okay to refill Dr Maple Hudson. Thanks.

## 2012-10-17 ENCOUNTER — Other Ambulatory Visit: Payer: Self-pay | Admitting: Dermatology

## 2012-10-17 DIAGNOSIS — C44319 Basal cell carcinoma of skin of other parts of face: Secondary | ICD-10-CM | POA: Diagnosis not present

## 2012-10-17 DIAGNOSIS — L820 Inflamed seborrheic keratosis: Secondary | ICD-10-CM | POA: Diagnosis not present

## 2012-10-17 DIAGNOSIS — D235 Other benign neoplasm of skin of trunk: Secondary | ICD-10-CM | POA: Diagnosis not present

## 2012-10-28 DIAGNOSIS — M75120 Complete rotator cuff tear or rupture of unspecified shoulder, not specified as traumatic: Secondary | ICD-10-CM | POA: Diagnosis not present

## 2012-10-28 DIAGNOSIS — M67919 Unspecified disorder of synovium and tendon, unspecified shoulder: Secondary | ICD-10-CM | POA: Diagnosis not present

## 2012-11-01 DIAGNOSIS — H353290 Exudative age-related macular degeneration, unspecified eye, stage unspecified: Secondary | ICD-10-CM | POA: Diagnosis not present

## 2012-11-01 DIAGNOSIS — H35059 Retinal neovascularization, unspecified, unspecified eye: Secondary | ICD-10-CM | POA: Diagnosis not present

## 2012-11-07 DIAGNOSIS — H43819 Vitreous degeneration, unspecified eye: Secondary | ICD-10-CM | POA: Diagnosis not present

## 2012-11-07 DIAGNOSIS — E1139 Type 2 diabetes mellitus with other diabetic ophthalmic complication: Secondary | ICD-10-CM | POA: Diagnosis not present

## 2012-11-07 DIAGNOSIS — H35059 Retinal neovascularization, unspecified, unspecified eye: Secondary | ICD-10-CM | POA: Diagnosis not present

## 2012-11-07 DIAGNOSIS — H353290 Exudative age-related macular degeneration, unspecified eye, stage unspecified: Secondary | ICD-10-CM | POA: Diagnosis not present

## 2012-11-07 DIAGNOSIS — E11329 Type 2 diabetes mellitus with mild nonproliferative diabetic retinopathy without macular edema: Secondary | ICD-10-CM | POA: Diagnosis not present

## 2012-12-06 DIAGNOSIS — H353290 Exudative age-related macular degeneration, unspecified eye, stage unspecified: Secondary | ICD-10-CM | POA: Diagnosis not present

## 2012-12-27 ENCOUNTER — Telehealth: Payer: Self-pay | Admitting: Internal Medicine

## 2012-12-27 DIAGNOSIS — R0600 Dyspnea, unspecified: Secondary | ICD-10-CM

## 2012-12-27 DIAGNOSIS — R079 Chest pain, unspecified: Secondary | ICD-10-CM

## 2012-12-27 NOTE — Addendum Note (Signed)
Addended by: Darrell Jewel on: 12/27/2012 03:30 PM   Modules accepted: Orders

## 2012-12-27 NOTE — Telephone Encounter (Signed)
I called and spoke with pt. He c/o increase SOB off and on all day w/ rest and activity. He is having off and on pain left side breast as well. Denies any cough, no wheezing, no chest tx. Pt has rescue inhaler but has not used this in a long time per pt. Requesting recs. Please advise Dr. Maple Hudson thanks  Allergies  Allergen Reactions  . Colchicine Anaphylaxis  . Atorvastatin Other (See Comments)    Joint soreness  . Ceftin Diarrhea  . Codeine Hives  . Erythromycin Diarrhea  . Ezetimibe Other (See Comments)    Joint soreness  . Nitroglycerin Other (See Comments)    lowers blood pressure   . Oxycodone-Acetaminophen Hives  . Rosuvastatin Other (See Comments)    Joint soreness  . Simvastatin Other (See Comments)    Joint soreness  . Vantin Other (See Comments)    Doesn't remember  . Contrast Media [Iodinated Diagnostic Agents] Nausea And Vomiting and Rash  . Tetanus Toxoid Rash

## 2012-12-27 NOTE — Telephone Encounter (Signed)
Per CY, can he come in for CXR? Dx: dyspnea and Chest pain. Pt aware and will come by in AM to have this done. Carron Curie, CMA

## 2012-12-27 NOTE — Telephone Encounter (Signed)
We sent in Mildred back in September x 5 refills. I called pharm and was advised he still has refills. ATC pt and line busy. wcb

## 2012-12-27 NOTE — Telephone Encounter (Signed)
SPoke with the pt and advised refills at pharmacy.Carron Curie, CMA

## 2012-12-28 ENCOUNTER — Ambulatory Visit (INDEPENDENT_AMBULATORY_CARE_PROVIDER_SITE_OTHER)
Admission: RE | Admit: 2012-12-28 | Discharge: 2012-12-28 | Disposition: A | Discharge status: Home / Self Care | Payer: Medicare Other | Source: Ambulatory Visit | Attending: Internal Medicine | Admitting: Internal Medicine

## 2012-12-28 DIAGNOSIS — R0600 Dyspnea, unspecified: Secondary | ICD-10-CM

## 2012-12-28 DIAGNOSIS — R0602 Shortness of breath: Secondary | ICD-10-CM | POA: Diagnosis not present

## 2012-12-28 DIAGNOSIS — R0989 Other specified symptoms and signs involving the circulatory and respiratory systems: Secondary | ICD-10-CM | POA: Diagnosis not present

## 2012-12-28 DIAGNOSIS — R0609 Other forms of dyspnea: Secondary | ICD-10-CM

## 2012-12-28 DIAGNOSIS — R079 Chest pain, unspecified: Secondary | ICD-10-CM

## 2013-02-07 DIAGNOSIS — H353290 Exudative age-related macular degeneration, unspecified eye, stage unspecified: Secondary | ICD-10-CM | POA: Diagnosis not present

## 2013-02-20 DIAGNOSIS — E7800 Pure hypercholesterolemia, unspecified: Secondary | ICD-10-CM | POA: Diagnosis not present

## 2013-02-20 DIAGNOSIS — I10 Essential (primary) hypertension: Secondary | ICD-10-CM | POA: Diagnosis not present

## 2013-02-20 DIAGNOSIS — E119 Type 2 diabetes mellitus without complications: Secondary | ICD-10-CM | POA: Diagnosis not present

## 2013-02-20 DIAGNOSIS — Z0000 Encounter for general adult medical examination without abnormal findings: Secondary | ICD-10-CM | POA: Diagnosis not present

## 2013-02-20 DIAGNOSIS — E039 Hypothyroidism, unspecified: Secondary | ICD-10-CM | POA: Diagnosis not present

## 2013-02-20 DIAGNOSIS — Z79899 Other long term (current) drug therapy: Secondary | ICD-10-CM | POA: Diagnosis not present

## 2013-02-20 DIAGNOSIS — Z1331 Encounter for screening for depression: Secondary | ICD-10-CM | POA: Diagnosis not present

## 2013-02-21 DIAGNOSIS — E7800 Pure hypercholesterolemia, unspecified: Secondary | ICD-10-CM | POA: Diagnosis not present

## 2013-02-21 DIAGNOSIS — I10 Essential (primary) hypertension: Secondary | ICD-10-CM | POA: Diagnosis not present

## 2013-02-21 DIAGNOSIS — E119 Type 2 diabetes mellitus without complications: Secondary | ICD-10-CM | POA: Diagnosis not present

## 2013-02-21 DIAGNOSIS — K219 Gastro-esophageal reflux disease without esophagitis: Secondary | ICD-10-CM | POA: Diagnosis not present

## 2013-02-21 DIAGNOSIS — E039 Hypothyroidism, unspecified: Secondary | ICD-10-CM | POA: Diagnosis not present

## 2013-02-21 DIAGNOSIS — Z79899 Other long term (current) drug therapy: Secondary | ICD-10-CM | POA: Diagnosis not present

## 2013-03-03 DIAGNOSIS — H353290 Exudative age-related macular degeneration, unspecified eye, stage unspecified: Secondary | ICD-10-CM | POA: Diagnosis not present

## 2013-03-16 DIAGNOSIS — C44121 Squamous cell carcinoma of skin of unspecified eyelid, including canthus: Secondary | ICD-10-CM | POA: Diagnosis not present

## 2013-03-16 DIAGNOSIS — H019 Unspecified inflammation of eyelid: Secondary | ICD-10-CM | POA: Diagnosis not present

## 2013-03-16 DIAGNOSIS — H029 Unspecified disorder of eyelid: Secondary | ICD-10-CM | POA: Diagnosis not present

## 2013-03-16 DIAGNOSIS — H01009 Unspecified blepharitis unspecified eye, unspecified eyelid: Secondary | ICD-10-CM | POA: Diagnosis not present

## 2013-03-16 DIAGNOSIS — H268 Other specified cataract: Secondary | ICD-10-CM | POA: Diagnosis not present

## 2013-03-20 ENCOUNTER — Ambulatory Visit: Payer: Medicare Other | Admitting: Internal Medicine

## 2013-03-21 DIAGNOSIS — E039 Hypothyroidism, unspecified: Secondary | ICD-10-CM | POA: Diagnosis not present

## 2013-04-04 DIAGNOSIS — H353290 Exudative age-related macular degeneration, unspecified eye, stage unspecified: Secondary | ICD-10-CM | POA: Diagnosis not present

## 2013-04-06 DIAGNOSIS — H029 Unspecified disorder of eyelid: Secondary | ICD-10-CM | POA: Diagnosis not present

## 2013-04-06 DIAGNOSIS — C801 Malignant (primary) neoplasm, unspecified: Secondary | ICD-10-CM | POA: Diagnosis not present

## 2013-04-06 DIAGNOSIS — H01009 Unspecified blepharitis unspecified eye, unspecified eyelid: Secondary | ICD-10-CM | POA: Diagnosis not present

## 2013-04-06 DIAGNOSIS — Z9889 Other specified postprocedural states: Secondary | ICD-10-CM | POA: Diagnosis not present

## 2013-04-13 ENCOUNTER — Telehealth: Payer: Self-pay | Admitting: Internal Medicine

## 2013-04-13 MED ORDER — PREDNISONE 20 MG PO TABS
20.0000 mg | ORAL_TABLET | Freq: Every day | ORAL | Status: DC
Start: 2013-04-13 — End: 2013-04-24

## 2013-04-13 NOTE — Telephone Encounter (Signed)
Pt aware of Rx and Claritin daily-if no better will call us back.

## 2013-04-13 NOTE — Telephone Encounter (Signed)
Suggest prednisone 20 mg, # 5, 1 daily                Daily claritin

## 2013-04-13 NOTE — Telephone Encounter (Signed)
Spoke with pt's wife. Reports cough and sinus congestion x4 weeks. Mucus is light yellow in color. Has tried some OTC sinus medication with no relief. Would like something called in or to be seen.  Allergies  Allergen Reactions  . Colchicine Anaphylaxis  . Atorvastatin Other (See Comments)    Joint soreness  . Ceftin Diarrhea  . Codeine Hives  . Erythromycin Diarrhea  . Ezetimibe Other (See Comments)    Joint soreness  . Nitroglycerin Other (See Comments)    lowers blood pressure   . Oxycodone-Acetaminophen Hives  . Rosuvastatin Other (See Comments)    Joint soreness  . Simvastatin Other (See Comments)    Joint soreness  . Vantin Other (See Comments)    Doesn't remember  . Contrast Media [Iodinated Diagnostic Agents] Nausea And Vomiting and Rash  . Tetanus Toxoid Rash   Current Outpatient Prescriptions on File Prior to Visit  Medication Sig Dispense Refill  . albuterol (PROAIR HFA) 108 (90 BASE) MCG/ACT inhaler Inhale 2 puffs into the lungs every 4 (four) hours as needed.        Marland Kitchen aspirin 81 MG tablet Take 81 mg by mouth daily.        . carbidopa-levodopa (SINEMET) 25-100 MG per tablet Take 1 tablet by mouth daily.      Marland Kitchen CARTIA XT 240 MG 24 hr capsule TAKE 1 CAPSULE DAILY       **[employer] MFG ONLY**  90 each  1  . Cholecalciferol (VITAMIN D3) 1000 UNITS CAPS Take 1 capsule by mouth daily.        Marland Kitchen Dextromethorphan Polistirex (DELSYM PO) Take 10 mLs by mouth daily.        . digoxin (LANOXIN) 0.25 MG tablet 1 by mouth daily, APPOINTMENT DUE  90 each  0  . furosemide (LASIX) 20 MG tablet Take 1 tablet (20 mg total) by mouth daily. *DYE FREE*  90 tablet  1  . HUMULIN R 100 UNIT/ML injection use as directed  10 mL  5  . insulin glargine (LANTUS) 100 UNIT/ML injection Inject 10-25 Units into the skin at bedtime. May go as high as 35 units at bedtime      . Insulin Syringe-Needle U-100 (B-D INS SYR ULTRAFINE 1CC/31G) 31G X 5/16" 1 ML MISC APPOINTMENT DUE, use 3 x daily  100 each  0   . levothyroxine (SYNTHROID) 200 MCG tablet Take 200 mcg by mouth daily before breakfast. Take with 75 mcg tablet      . levothyroxine (SYNTHROID, LEVOTHROID) 75 MCG tablet Take 75 mcg by mouth daily before breakfast. Take with 200 mcg tablet      . Multiple Vitamins-Minerals (OCUVITE EXTRA PO) Take 1 tablet by mouth daily.        Marland Kitchen omeprazole (PRILOSEC OTC) 20 MG tablet Take 20 mg by mouth daily.        . WELCHOL 625 MG tablet TAKE 2 TABLETS 3 TIMES A   DAY. PLEASE MAKE AN        APPOINTMENT FORLABWORK    11/2010  540 tablet  0  . zolpidem (AMBIEN) 5 MG tablet Take 1 tablet (5 mg total) by mouth at bedtime as needed.  30 tablet  5   No current facility-administered medications on file prior to visit.    CY - please advise. Thanks.

## 2013-04-18 ENCOUNTER — Telehealth: Payer: Self-pay | Admitting: Internal Medicine

## 2013-04-18 MED ORDER — DOXYCYCLINE HYCLATE 100 MG PO TABS: 100.0000 mg | ORAL_TABLET | Freq: Every day | ORAL | Status: DC

## 2013-04-18 NOTE — Telephone Encounter (Signed)
Spoke with pt and advised him per CY to start doxy 100mg , 2 tabs today and then 1 tab daily until completed. Pt aware this is at the pharm for pick and nothing further is needed

## 2013-04-18 NOTE — Telephone Encounter (Signed)
Spoke with pt. CDy called in pred 20 x 5 days on 04/13/13. Pt reports he is still coughing all night long, getting up very little phlem and it is grey colored, chest tx d/t cough. No wheezing, no f/c/s/n/v. Requesting further recs. Please advise Dr. Annamaria Boots thanks  Allergies  Allergen Reactions  . Colchicine Anaphylaxis  . Atorvastatin Other (See Comments)    Joint soreness  . Ceftin Diarrhea  . Codeine Hives  . Erythromycin Diarrhea  . Ezetimibe Other (See Comments)    Joint soreness  . Nitroglycerin Other (See Comments)    lowers blood pressure   . Oxycodone-Acetaminophen Hives  . Rosuvastatin Other (See Comments)    Joint soreness  . Simvastatin Other (See Comments)    Joint soreness  . Vantin Other (See Comments)    Doesn't remember  . Contrast Media [Iodinated Diagnostic Agents] Nausea And Vomiting and Rash  . Tetanus Toxoid Rash     Current Outpatient Prescriptions on File Prior to Visit  Medication Sig Dispense Refill  . albuterol (PROAIR HFA) 108 (90 BASE) MCG/ACT inhaler Inhale 2 puffs into the lungs every 4 (four) hours as needed.        Marland Kitchen aspirin 81 MG tablet Take 81 mg by mouth daily.        . carbidopa-levodopa (SINEMET) 25-100 MG per tablet Take 1 tablet by mouth daily.      Marland Kitchen CARTIA XT 240 MG 24 hr capsule TAKE 1 CAPSULE DAILY       **WATSON MFG ONLY**  90 each  1  . Cholecalciferol (VITAMIN D3) 1000 UNITS CAPS Take 1 capsule by mouth daily.        Marland Kitchen Dextromethorphan Polistirex (DELSYM PO) Take 10 mLs by mouth daily.        . digoxin (LANOXIN) 0.25 MG tablet 1 by mouth daily, APPOINTMENT DUE  90 each  0  . furosemide (LASIX) 20 MG tablet Take 1 tablet (20 mg total) by mouth daily. *DYE FREE*  90 tablet  1  . HUMULIN R 100 UNIT/ML injection use as directed  10 mL  5  . insulin glargine (LANTUS) 100 UNIT/ML injection Inject 10-25 Units into the skin at bedtime. May go as high as 35 units at bedtime      . Insulin Syringe-Needle U-100 (B-D INS SYR ULTRAFINE 1CC/31G) 31G X  5/16" 1 ML MISC APPOINTMENT DUE, use 3 x daily  100 each  0  . levothyroxine (SYNTHROID) 200 MCG tablet Take 200 mcg by mouth daily before breakfast. Take with 75 mcg tablet      . levothyroxine (SYNTHROID, LEVOTHROID) 75 MCG tablet Take 75 mcg by mouth daily before breakfast. Take with 200 mcg tablet      . Multiple Vitamins-Minerals (OCUVITE EXTRA PO) Take 1 tablet by mouth daily.        Marland Kitchen omeprazole (PRILOSEC OTC) 20 MG tablet Take 20 mg by mouth daily.        . predniSONE (DELTASONE) 20 MG tablet Take 1 tablet (20 mg total) by mouth daily with breakfast.  5 tablet  0  . WELCHOL 625 MG tablet TAKE 2 TABLETS 3 TIMES A   DAY. PLEASE MAKE AN        APPOINTMENT FORLABWORK    11/2010  540 tablet  0  . zolpidem (AMBIEN) 5 MG tablet Take 1 tablet (5 mg total) by mouth at bedtime as needed.  30 tablet  5   No current facility-administered medications on file prior to visit.

## 2013-04-18 NOTE — Telephone Encounter (Signed)
Offer doxycycline 100 mg, # 8, 2 today then one daily 

## 2013-04-24 ENCOUNTER — Telehealth: Payer: Self-pay | Admitting: Internal Medicine

## 2013-04-24 ENCOUNTER — Encounter: Payer: Self-pay | Admitting: Internal Medicine

## 2013-04-24 ENCOUNTER — Ambulatory Visit (INDEPENDENT_AMBULATORY_CARE_PROVIDER_SITE_OTHER): Payer: Medicare Other | Admitting: Internal Medicine

## 2013-04-24 VITALS — BP 144/76 | HR 65 | Temp 98.3°F | Ht 69.0 in | Wt 172.2 lb

## 2013-04-24 DIAGNOSIS — L509 Urticaria, unspecified: Secondary | ICD-10-CM | POA: Diagnosis not present

## 2013-04-24 DIAGNOSIS — J309 Allergic rhinitis, unspecified: Secondary | ICD-10-CM | POA: Diagnosis not present

## 2013-04-24 DIAGNOSIS — J209 Acute bronchitis, unspecified: Secondary | ICD-10-CM

## 2013-04-24 DIAGNOSIS — J206 Acute bronchitis due to rhinovirus: Secondary | ICD-10-CM

## 2013-04-24 DIAGNOSIS — B9789 Other viral agents as the cause of diseases classified elsewhere: Secondary | ICD-10-CM

## 2013-04-24 DIAGNOSIS — J3089 Other allergic rhinitis: Secondary | ICD-10-CM

## 2013-04-24 DIAGNOSIS — J302 Other seasonal allergic rhinitis: Secondary | ICD-10-CM

## 2013-04-24 MED ORDER — AZELASTINE-FLUTICASONE 137-50 MCG/ACT NA SUSP: 2.0000 | Freq: Every day | NASAL | Status: DC

## 2013-04-24 MED ORDER — ALBUTEROL SULFATE HFA 108 (90 BASE) MCG/ACT IN AERS: INHALATION_SPRAY | RESPIRATORY_TRACT | Status: DC

## 2013-04-24 MED ORDER — LEVALBUTEROL HCL 0.63 MG/3ML IN NEBU
0.6300 mg | INHALATION_SOLUTION | Freq: Once | RESPIRATORY_TRACT | Status: AC
  Administered 2013-04-24: 0.63 mg via RESPIRATORY_TRACT

## 2013-04-24 MED ORDER — METHYLPREDNISOLONE ACETATE 80 MG/ML IJ SUSP
80.0000 mg | Freq: Once | INTRAMUSCULAR | Status: AC
  Administered 2013-04-24: 80 mg via INTRAMUSCULAR

## 2013-04-24 NOTE — Progress Notes (Signed)
Patient ID: [name], male    DOB: 10-03-1931, 80 y.o.   MRN: [id number]  HPI Last here Decmber 6, 2011. Wive here now. He is going to [place] for second opinion about ? Parkinsons.  He notices that he coughs more, with some sputum, if he lies on right side. No recent antibiotics.  Increased watery nose with Spring pollen.  CXR- 12/25/09- clear, NAD.  Asks refill generic ambien for occasional nonspecific insomnia.   11/17/10- 78 yoM never smoker followed for recurrent bronchitis, complicated by hx urticaria, PAFIB, DM. Wife here Has had flu vaccine. He had a recent upper respiratory infection with watery rhinorrhea headache and malaise because of which she spent a few days in the house and in bed. He took Benadryl and Tylenol. That illness is largely resolved. He still has nasal congestion and some cough producing clear mucus. He has not been needing his rescue inhaler. He asks for a Depo-Medrol injection and we discussed his blood sugar status. He likes benzonatate for cough.  01/14/11- 78 yoM never smoker followed for recurrent bronchitis, complicated by hx urticaria, PAFIB, DM, Chronic insomnia.   Wife here Acute visit-cough; productive-white and thick, sinus drainage, wheezing as well-worse at night Has had flu vaccine. Went to [facility] emergency room on Christmas Eve with malaise. He was treated with continuous nebulizer, intravenous steroids and prednisone taper. The next day he had some fever which is resolved. He complains of persistent postnasal drip, dry cough and malaise. With hard coughing he has had some sharp left lateral chest wall pain. Sleeping sitting up. His raspy breathing noise worries him. He is taking Delsym, Mucinex D. and says antibiotics have not helped. Still on prednisone. For his problem of chronic insomnia-complains he can't sleep. Waiting for prior authorization to renew his Ambien and can't sleep without it.  03/02/11-  78 yoM never smoker followed for  recurrent bronchitis, complicated by hx urticaria, PAFIB, DM, Chronic insomnia.  C/O cough getting worse-night time; unable to sleep; ENT 3 weeks ago-was told to use salt water mixture in nose. He has been coughing for 6 weeks. His ENT doctor told him to take Prilosec twice daily and that did seem to help initially. In the last 2 weeks cough is worse again, productive of scant white sputum. Taking benzonatate. Much rhinorrhea. Denies any reflux or choking with food but then admits he swallows bread after pills to make them go down.  05/30/12-  80 yoM never smoker followed for recurrent bronchitis, complicated by hx urticaria, PAFIB, DM, Chronic insomnia.  Wife here FOLLOWS FOR: sinus drainage and slight cough at times; Denies any wheezing or  SOB. Scheduled f/u without acute concerns. No urticaria. Some pndrip and throat clearing. Little cough and no wheeze. No concerns about meds or pollen season.  04/24/13- 80 yoM never smoker followed for recurrent bronchitis, complicated by hx urticaria, PAFIB, DM, Chronic insomnia.  Wife here FOLLOWS FOR: acute visit. C/o increase in cough and unable to produce mucus with cough. C/o chest soreness from coughing  and SOB with activity.  2-3 weeks much dry cough and wheeze when lying down. Rescue inhaler not enough help. Postnasal drip. Prednisone +/- help.  Review of Systems-see HPI Constitutional:   No-   weight loss, night sweats, fevers, chills, fatigue, lassitude. HEENT:   No-  headaches, difficulty swallowing, tooth/dental problems, sore throat,       No-  sneezing, itching, ear ache, nasal congestion, +post nasal drip,  CV:  No-  chest pain, orthopnea, PND, swelling in lower extremities, anasarca, or dizziness, palpitations Resp: +   shortness of breath with exertion or at rest.              No- productive cough,  + non-productive cough,  No- coughing up of blood.              No-   change in color of mucus.  No- wheezing.   Skin: No-   rash or  lesions. GI:  No-   heartburn, indigestion, abdominal pain, nausea, vomiting, diarrhea,                 change in bowel habits, loss of appetite GU:  MS:  No-   joint pain or swelling.    No- back pain. Neuro-     nothing unusual Psych:  No- change in mood or affect. No depression or anxiety.  No memory loss.  Objective:   Physical Exam General- Alert, Oriented, Affect-appropriate, Distress- none acute Skin- rash-none, lesions- none, excoriation- none Lymphadenopathy- none Head- atraumatic            Eyes- Gross vision intact, PERRLA, conjunctivae clear secretions            Ears- Hearing aid, HOH            Nose- pale and watery, no-Septal dev, mucus, polyps, erosion, perforation             Throat- Mallampati II-III a been in a , mucosa clear , drainage- none, tonsils- atrophic Neck- flexible , trachea midline, no stridor , thyroid nl, carotid no bruit Chest - symmetrical excursion , unlabored           Heart/CV- RRR , no murmur , no gallop  , no rub, nl s1 s2                           - JVD- none , edema- none, stasis changes- none, varices- none           Lung- clear, cough+ deep , dullness-none, rub- none           Chest wall-  Abd- Br/ Gen/ Rectal- Not done, not indicated Extrem- cyanosis- none, clubbing, none, atrophy- none, strength- nl Neuro- grossly intact to observation, hesitant speech

## 2013-04-24 NOTE — Patient Instructions (Signed)
Neb xop 0.63  Depo 80  Sample Dymista nasal spray    1-2 puffs each nostril once daily at bedtime  Keep appointment May 19

## 2013-04-24 NOTE — Telephone Encounter (Signed)
Spoke with spouse. Reports pt having prod cough w/ yellow phlem, lots of wheezing and SOB is worse. Pt has been on round of doxy and prednisone not getting better. Wants to be seen today. Please advise Dr. Annamaria Boots thanks  Allergies  Allergen Reactions  . Colchicine Anaphylaxis  . Atorvastatin Other (See Comments)    Joint soreness  . Ceftin Diarrhea  . Codeine Hives  . Erythromycin Diarrhea  . Ezetimibe Other (See Comments)    Joint soreness  . Nitroglycerin Other (See Comments)    lowers blood pressure   . Oxycodone-Acetaminophen Hives  . Rosuvastatin Other (See Comments)    Joint soreness  . Simvastatin Other (See Comments)    Joint soreness  . Vantin Other (See Comments)    Doesn't remember  . Contrast Media [Iodinated Diagnostic Agents] Nausea And Vomiting and Rash  . Tetanus Toxoid Rash

## 2013-04-24 NOTE — Telephone Encounter (Signed)
Pt is aware to come in today at 2pm. Nothing further was needed.

## 2013-04-24 NOTE — Telephone Encounter (Signed)
Please have patient come in today at 2pm slot. Thanks.

## 2013-05-05 ENCOUNTER — Telehealth: Payer: Self-pay | Admitting: Internal Medicine

## 2013-05-05 MED ORDER — TRAMADOL HCL 50 MG PO TABS: 50.0000 mg | ORAL_TABLET | Freq: Three times a day (TID) | ORAL | Status: DC | PRN

## 2013-05-05 NOTE — Telephone Encounter (Signed)
If he can take tramadol, Suggest trial 50 mg, # 30, 1 every 8 hours if needed for cough

## 2013-05-05 NOTE — Telephone Encounter (Signed)
I called spoke with [name]. She reports tramadol is not for cough. I advised her we have several providers that do use this medication for cough. She needed nothing further

## 2013-05-05 NOTE — Telephone Encounter (Signed)
Called spoke with pt. Reports his cough is much worse from when he was in last week. He is coughing up grey phlem, PND. He is taking delsym and using his nasal sprays. Please advise CDY thanks  Allergies  Allergen Reactions  . Colchicine Anaphylaxis  . Atorvastatin Other (See Comments)    Joint soreness  . Ceftin Diarrhea  . Codeine Hives  . Erythromycin Diarrhea  . Ezetimibe Other (See Comments)    Joint soreness  . Nitroglycerin Other (See Comments)    lowers blood pressure   . Oxycodone-Acetaminophen Hives  . Rosuvastatin Other (See Comments)    Joint soreness  . Simvastatin Other (See Comments)    Joint soreness  . Vantin Other (See Comments)    Doesn't remember  . Contrast Media [Iodinated Diagnostic Agents] Nausea And Vomiting and Rash  . Tetanus Toxoid Rash    Current Outpatient Prescriptions on File Prior to Visit  Medication Sig Dispense Refill  . albuterol (PROAIR HFA) 108 (90 BASE) MCG/ACT inhaler 2 puffs every 6 hours as needed for wheeze/ cough  1 Inhaler  prn  . aspirin 81 MG tablet Take 81 mg by mouth daily.        . Azelastine-Fluticasone (DYMISTA) 137-50 MCG/ACT SUSP Place 2 sprays into both nostrils at bedtime.  1 Bottle  0  . carbidopa-levodopa (SINEMET) 25-100 MG per tablet Take 1 tablet by mouth daily.      Marland Kitchen CARTIA XT 240 MG 24 hr capsule TAKE 1 CAPSULE DAILY       **[facility] MFG ONLY**  90 each  1  . Cholecalciferol (VITAMIN D3) 1000 UNITS CAPS Take 1 capsule by mouth daily.        Marland Kitchen Dextromethorphan Polistirex (DELSYM PO) Take 10 mLs by mouth daily.        . digoxin (LANOXIN) 0.25 MG tablet 1 by mouth daily, APPOINTMENT DUE  90 each  0  . furosemide (LASIX) 20 MG tablet Take 1 tablet (20 mg total) by mouth daily. *DYE FREE*  90 tablet  1  . HUMULIN R 100 UNIT/ML injection use as directed  10 mL  5  . insulin glargine (LANTUS) 100 UNIT/ML injection Inject 10-25 Units into the skin at bedtime. May go as high as 35 units at bedtime      . Insulin Syringe-Needle  U-100 (B-D INS SYR ULTRAFINE 1CC/31G) 31G X 5/16" 1 ML MISC APPOINTMENT DUE, use 3 x daily  100 each  0  . levothyroxine (SYNTHROID) 200 MCG tablet Take 200 mcg by mouth daily before breakfast. Take with 75 mcg tablet      . levothyroxine (SYNTHROID, LEVOTHROID) 75 MCG tablet Take 75 mcg by mouth daily before breakfast. Take with 200 mcg tablet      . Multiple Vitamins-Minerals (OCUVITE EXTRA PO) Take 1 tablet by mouth daily.        Marland Kitchen omeprazole (PRILOSEC OTC) 20 MG tablet Take 20 mg by mouth daily.        . WELCHOL 625 MG tablet TAKE 2 TABLETS 3 TIMES A   DAY. PLEASE MAKE AN        APPOINTMENT FORLABWORK    11/2010  540 tablet  0  . zolpidem (AMBIEN) 5 MG tablet Take 1 tablet (5 mg total) by mouth at bedtime as needed.  30 tablet  5   No current facility-administered medications on file prior to visit.

## 2013-05-05 NOTE — Telephone Encounter (Signed)
Spoke with pt and wife. They would like tramadol called in i called [facility] and gave VO. Nothing further needed

## 2013-05-08 ENCOUNTER — Ambulatory Visit
Admission: RE | Admit: 2013-05-08 | Discharge: 2013-05-08 | Disposition: A | Discharge status: Home / Self Care | Payer: Medicare Other | Source: Ambulatory Visit | Attending: Internal Medicine | Admitting: Internal Medicine

## 2013-05-08 ENCOUNTER — Other Ambulatory Visit: Payer: Self-pay | Admitting: Internal Medicine

## 2013-05-08 DIAGNOSIS — J42 Unspecified chronic bronchitis: Secondary | ICD-10-CM | POA: Diagnosis not present

## 2013-05-08 DIAGNOSIS — R0602 Shortness of breath: Secondary | ICD-10-CM | POA: Diagnosis not present

## 2013-05-08 DIAGNOSIS — R05 Cough: Secondary | ICD-10-CM | POA: Diagnosis not present

## 2013-05-08 DIAGNOSIS — R059 Cough, unspecified: Secondary | ICD-10-CM | POA: Diagnosis not present

## 2013-05-08 DIAGNOSIS — R079 Chest pain, unspecified: Secondary | ICD-10-CM | POA: Diagnosis not present

## 2013-05-15 DIAGNOSIS — C44121 Squamous cell carcinoma of skin of unspecified eyelid, including canthus: Secondary | ICD-10-CM | POA: Insufficient documentation

## 2013-05-23 DIAGNOSIS — Z9889 Other specified postprocedural states: Secondary | ICD-10-CM | POA: Diagnosis not present

## 2013-05-23 DIAGNOSIS — Z4889 Encounter for other specified surgical aftercare: Secondary | ICD-10-CM | POA: Diagnosis not present

## 2013-05-23 NOTE — Assessment & Plan Note (Signed)
Nonspecific and associated with bronchitis. This may be both viral and allergic. Postnasal drip is bothersome. Plan-sample Dymista nasal spray

## 2013-05-23 NOTE — Assessment & Plan Note (Signed)
Persistent bronchitis over the last couple of weeks may become an issue of viral and allergy. Plan-nebulizer Xopenex, Depo-Medrol

## 2013-05-23 NOTE — Assessment & Plan Note (Signed)
No recurrence. 

## 2013-05-30 ENCOUNTER — Ambulatory Visit: Payer: Medicare Other | Admitting: Internal Medicine

## 2013-05-31 ENCOUNTER — Ambulatory Visit: Payer: Medicare Other | Admitting: Internal Medicine

## 2013-06-06 DIAGNOSIS — R05 Cough: Secondary | ICD-10-CM | POA: Diagnosis not present

## 2013-06-06 DIAGNOSIS — R059 Cough, unspecified: Secondary | ICD-10-CM | POA: Diagnosis not present

## 2013-06-13 DIAGNOSIS — H3530 Unspecified macular degeneration: Secondary | ICD-10-CM | POA: Diagnosis not present

## 2013-06-13 DIAGNOSIS — H35059 Retinal neovascularization, unspecified, unspecified eye: Secondary | ICD-10-CM | POA: Diagnosis not present

## 2013-06-13 DIAGNOSIS — H353290 Exudative age-related macular degeneration, unspecified eye, stage unspecified: Secondary | ICD-10-CM | POA: Diagnosis not present

## 2013-06-14 DIAGNOSIS — M545 Low back pain, unspecified: Secondary | ICD-10-CM | POA: Diagnosis not present

## 2013-06-14 DIAGNOSIS — M47817 Spondylosis without myelopathy or radiculopathy, lumbosacral region: Secondary | ICD-10-CM | POA: Diagnosis not present

## 2013-06-22 ENCOUNTER — Telehealth: Payer: Self-pay | Admitting: Internal Medicine

## 2013-06-22 MED ORDER — ZOLPIDEM TARTRATE 5 MG PO TABS: 5.0000 mg | ORAL_TABLET | Freq: Every evening | ORAL | Status: DC | PRN

## 2013-06-22 NOTE — Telephone Encounter (Signed)
RX has been called in. Nothing further need and pt aware and spouse aware

## 2013-06-22 NOTE — Telephone Encounter (Signed)
ambien last refilled 09/22/12 #30 x 5 refills Last OV 04/24/13 Please advise Dr. Annamaria Boots thanks

## 2013-06-22 NOTE — Telephone Encounter (Signed)
Ok to refill 

## 2013-06-27 DIAGNOSIS — C44121 Squamous cell carcinoma of skin of unspecified eyelid, including canthus: Secondary | ICD-10-CM | POA: Diagnosis not present

## 2013-06-27 DIAGNOSIS — Z9889 Other specified postprocedural states: Secondary | ICD-10-CM | POA: Diagnosis not present

## 2013-06-27 DIAGNOSIS — Z48810 Encounter for surgical aftercare following surgery on the sense organs: Secondary | ICD-10-CM | POA: Diagnosis not present

## 2013-07-03 DIAGNOSIS — M545 Low back pain, unspecified: Secondary | ICD-10-CM | POA: Diagnosis not present

## 2013-07-03 DIAGNOSIS — M47817 Spondylosis without myelopathy or radiculopathy, lumbosacral region: Secondary | ICD-10-CM | POA: Diagnosis not present

## 2013-07-18 DIAGNOSIS — H3520 Other non-diabetic proliferative retinopathy, unspecified eye: Secondary | ICD-10-CM | POA: Diagnosis not present

## 2013-07-18 DIAGNOSIS — H3530 Unspecified macular degeneration: Secondary | ICD-10-CM | POA: Diagnosis not present

## 2013-07-18 DIAGNOSIS — H35059 Retinal neovascularization, unspecified, unspecified eye: Secondary | ICD-10-CM | POA: Diagnosis not present

## 2013-07-18 DIAGNOSIS — H353290 Exudative age-related macular degeneration, unspecified eye, stage unspecified: Secondary | ICD-10-CM | POA: Diagnosis not present

## 2013-07-21 DIAGNOSIS — M47817 Spondylosis without myelopathy or radiculopathy, lumbosacral region: Secondary | ICD-10-CM | POA: Diagnosis not present

## 2013-07-28 DIAGNOSIS — R634 Abnormal weight loss: Secondary | ICD-10-CM | POA: Diagnosis not present

## 2013-07-28 DIAGNOSIS — Z79899 Other long term (current) drug therapy: Secondary | ICD-10-CM | POA: Diagnosis not present

## 2013-07-28 DIAGNOSIS — R7301 Impaired fasting glucose: Secondary | ICD-10-CM | POA: Diagnosis not present

## 2013-07-28 DIAGNOSIS — E039 Hypothyroidism, unspecified: Secondary | ICD-10-CM | POA: Diagnosis not present

## 2013-07-28 DIAGNOSIS — I129 Hypertensive chronic kidney disease with stage 1 through stage 4 chronic kidney disease, or unspecified chronic kidney disease: Secondary | ICD-10-CM | POA: Diagnosis not present

## 2013-07-28 DIAGNOSIS — N183 Chronic kidney disease, stage 3 unspecified: Secondary | ICD-10-CM | POA: Diagnosis not present

## 2013-07-28 DIAGNOSIS — R42 Dizziness and giddiness: Secondary | ICD-10-CM | POA: Diagnosis not present

## 2013-07-28 DIAGNOSIS — R5383 Other fatigue: Secondary | ICD-10-CM | POA: Diagnosis not present

## 2013-07-28 DIAGNOSIS — Z23 Encounter for immunization: Secondary | ICD-10-CM | POA: Diagnosis not present

## 2013-07-28 DIAGNOSIS — R5381 Other malaise: Secondary | ICD-10-CM | POA: Diagnosis not present

## 2013-08-02 DIAGNOSIS — M545 Low back pain, unspecified: Secondary | ICD-10-CM | POA: Diagnosis not present

## 2013-08-02 DIAGNOSIS — M47817 Spondylosis without myelopathy or radiculopathy, lumbosacral region: Secondary | ICD-10-CM | POA: Diagnosis not present

## 2013-08-08 DIAGNOSIS — H353290 Exudative age-related macular degeneration, unspecified eye, stage unspecified: Secondary | ICD-10-CM | POA: Diagnosis not present

## 2013-08-08 DIAGNOSIS — H35359 Cystoid macular degeneration, unspecified eye: Secondary | ICD-10-CM | POA: Diagnosis not present

## 2013-08-08 DIAGNOSIS — H3530 Unspecified macular degeneration: Secondary | ICD-10-CM | POA: Diagnosis not present

## 2013-08-08 DIAGNOSIS — H35059 Retinal neovascularization, unspecified, unspecified eye: Secondary | ICD-10-CM | POA: Diagnosis not present

## 2013-08-09 DIAGNOSIS — M47817 Spondylosis without myelopathy or radiculopathy, lumbosacral region: Secondary | ICD-10-CM | POA: Diagnosis not present

## 2013-08-09 DIAGNOSIS — M545 Low back pain, unspecified: Secondary | ICD-10-CM | POA: Diagnosis not present

## 2013-08-10 DIAGNOSIS — H35352 Cystoid macular degeneration, left eye: Secondary | ICD-10-CM | POA: Insufficient documentation

## 2013-08-21 DIAGNOSIS — N3941 Urge incontinence: Secondary | ICD-10-CM | POA: Diagnosis not present

## 2013-08-21 DIAGNOSIS — E119 Type 2 diabetes mellitus without complications: Secondary | ICD-10-CM | POA: Diagnosis not present

## 2013-08-21 DIAGNOSIS — N318 Other neuromuscular dysfunction of bladder: Secondary | ICD-10-CM | POA: Diagnosis not present

## 2013-08-21 DIAGNOSIS — C61 Malignant neoplasm of prostate: Secondary | ICD-10-CM | POA: Diagnosis not present

## 2013-08-21 DIAGNOSIS — R42 Dizziness and giddiness: Secondary | ICD-10-CM | POA: Diagnosis not present

## 2013-08-21 DIAGNOSIS — I10 Essential (primary) hypertension: Secondary | ICD-10-CM | POA: Diagnosis not present

## 2013-08-21 DIAGNOSIS — E039 Hypothyroidism, unspecified: Secondary | ICD-10-CM | POA: Diagnosis not present

## 2013-08-21 DIAGNOSIS — Z8546 Personal history of malignant neoplasm of prostate: Secondary | ICD-10-CM | POA: Diagnosis not present

## 2013-08-21 DIAGNOSIS — R351 Nocturia: Secondary | ICD-10-CM | POA: Diagnosis not present

## 2013-08-22 DIAGNOSIS — H3530 Unspecified macular degeneration: Secondary | ICD-10-CM | POA: Diagnosis not present

## 2013-08-22 DIAGNOSIS — H35059 Retinal neovascularization, unspecified, unspecified eye: Secondary | ICD-10-CM | POA: Diagnosis not present

## 2013-08-22 DIAGNOSIS — H353290 Exudative age-related macular degeneration, unspecified eye, stage unspecified: Secondary | ICD-10-CM | POA: Diagnosis not present

## 2013-08-22 DIAGNOSIS — E11329 Type 2 diabetes mellitus with mild nonproliferative diabetic retinopathy without macular edema: Secondary | ICD-10-CM | POA: Diagnosis not present

## 2013-08-22 DIAGNOSIS — E1139 Type 2 diabetes mellitus with other diabetic ophthalmic complication: Secondary | ICD-10-CM | POA: Diagnosis not present

## 2013-08-23 ENCOUNTER — Other Ambulatory Visit: Payer: Self-pay | Admitting: Urology

## 2013-09-01 ENCOUNTER — Encounter (HOSPITAL_COMMUNITY): Payer: Self-pay | Admitting: Pharmacy Technician

## 2013-09-06 ENCOUNTER — Encounter (HOSPITAL_COMMUNITY): Payer: Self-pay | Admitting: Pharmacy Technician

## 2013-09-07 ENCOUNTER — Inpatient Hospital Stay (HOSPITAL_COMMUNITY): Admission: RE | Admit: 2013-09-07 | Payer: Medicare Other | Source: Ambulatory Visit

## 2013-09-07 ENCOUNTER — Encounter (HOSPITAL_COMMUNITY): Payer: Self-pay

## 2013-09-07 ENCOUNTER — Encounter (HOSPITAL_COMMUNITY)
Admission: RE | Admit: 2013-09-07 | Discharge: 2013-09-07 | Disposition: A | Discharge status: Home / Self Care | Payer: Medicare Other | Source: Ambulatory Visit | Attending: Urology | Admitting: Urology

## 2013-09-07 DIAGNOSIS — N3941 Urge incontinence: Secondary | ICD-10-CM | POA: Insufficient documentation

## 2013-09-07 DIAGNOSIS — Z01818 Encounter for other preprocedural examination: Secondary | ICD-10-CM | POA: Diagnosis not present

## 2013-09-07 HISTORY — DX: Overactive bladder: N32.81

## 2013-09-07 HISTORY — DX: Unspecified hearing loss, unspecified ear: H91.90

## 2013-09-07 HISTORY — DX: Unspecified osteoarthritis, unspecified site: M19.90

## 2013-09-07 HISTORY — DX: Reserved for inherently not codable concepts without codable children: IMO0001

## 2013-09-07 HISTORY — DX: Gastro-esophageal reflux disease without esophagitis: K21.9

## 2013-09-07 HISTORY — DX: Personal history of urinary calculi: Z87.442

## 2013-09-07 LAB — CBC
HCT: 39.9 % (ref 39.0–52.0)
HEMOGLOBIN: 13.2 g/dL (ref 13.0–17.0)
MCH: 31.1 pg (ref 26.0–34.0)
MCHC: 33.1 g/dL (ref 30.0–36.0)
MCV: 94.1 fL (ref 78.0–100.0)
Platelets: 193 10*3/uL (ref 150–400)
RBC: 4.24 MIL/uL (ref 4.22–5.81)
RDW: 12.3 % (ref 11.5–15.5)
WBC: 5.7 10*3/uL (ref 4.0–10.5)

## 2013-09-07 LAB — BASIC METABOLIC PANEL
Anion gap: 12 (ref 5–15)
BUN: 20 mg/dL (ref 6–23)
CALCIUM: 9.1 mg/dL (ref 8.4–10.5)
CO2: 27 meq/L (ref 19–32)
CREATININE: 1.12 mg/dL (ref 0.50–1.35)
Chloride: 102 mEq/L (ref 96–112)
GFR calc Af Amer: 70 mL/min — ABNORMAL LOW (ref >90)
GFR calc non Af Amer: 61 mL/min — ABNORMAL LOW (ref >90)
Glucose, Bld: 128 mg/dL — ABNORMAL HIGH (ref 70–99)
Potassium: 4.6 mEq/L (ref 3.7–5.3)
Sodium: 141 mEq/L (ref 137–147)

## 2013-09-07 NOTE — Pre-Procedure Instructions (Signed)
09-07-13 EKG of recent requested form Dr. Particia Nearing office. 09-07-13 1302 fax received from Dr. Cleophus Molt recent EKG done there.

## 2013-09-07 NOTE — Patient Instructions (Signed)
[place]  09/07/2013   Your procedure is scheduled on:   -09-11-2013 Monday  Enter through [facility] Entrance and follow signs to [facility]. Arrive at   0700     AM .  Call this number if you have problems the morning of surgery: [phone]  Or Presurgical Testing [phone].   For Living Will and/or Health Care Power Attorney Forms: please provide copy for your medical record,may bring AM of surgery(Forms should be already notarized -we do not provide this service).(Yes/09-07-13 document provided/placed with chart today).      Do not eat food/ or drink: After Midnight.      Take these medicines the morning of surgery with A SIP OF WATER: Levothyroxine. Omeprazole. Use Lantus insulin(1/2 usual PM dose 09-10-13)-Do not take any Insulin or Diabetic meds AM of. Bring Albuterol inhaler/use if needed.   Do not wear jewelry, make-up or nail polish.  Do not wear lotions, powders, or perfumes. You may wear deodorant.  Do not shave 48 hours(2 days) prior to first CHG shower(legs and under arms).(Shaving face and neck okay.)  Do not bring valuables to the hospital.(Hospital is not responsible for lost valuables).  Contacts, dentures or removable bridgework, body piercing, hair pins may not be worn into surgery.  Leave suitcase in the car. After surgery it may be brought to your room.  For patients admitted to the hospital, checkout time is 11:00 AM the day of discharge.(Restricted visitors-Any Persons displaying flu-like symptoms or illness).    Patients discharged the day of surgery will not be allowed to drive home. Must have responsible person with you x 24 hours once discharged.  Name and phone number of your driver: [name] spouse [phone] h  Special Instructions: CHG(Chlorhedine 4%-"Hibiclens","Betasept","Aplicare") Shower Use Special Wash: see special instructions.(avoid face and genitals)         _____________________    [facility] - Preparing for  Surgery Before surgery, you can play an important role.  Because skin is not sterile, your skin needs to be as free of germs as possible.  You can reduce the number of germs on your skin by washing with CHG (chlorahexidine gluconate) soap before surgery.  CHG is an antiseptic cleaner which kills germs and bonds with the skin to continue killing germs even after washing. Please DO NOT use if you have an allergy to CHG or antibacterial soaps.  If your skin becomes reddened/irritated stop using the CHG and inform your nurse when you arrive at Short Stay. Do not shave (including legs and underarms) for at least 48 hours prior to the first CHG shower.  You may shave your face/neck. Please follow these instructions carefully:  1.  Shower with CHG Soap the night before surgery and the  morning of Surgery.  2.  If you choose to wash your hair, wash your hair first as usual with your  normal  shampoo.  3.  After you shampoo, rinse your hair and body thoroughly to remove the  shampoo.                           4.  Use CHG as you would any other liquid soap.  You can apply chg directly  to the skin and wash                       Gently with a scrungie or clean washcloth.  5.  Apply  the CHG Soap to your body ONLY FROM THE NECK DOWN.   Do not use on face/ open                           Wound or open sores. Avoid contact with eyes, ears mouth and genitals (private parts).                       Wash face,  Genitals (private parts) with your normal soap.             6.  Wash thoroughly, paying special attention to the area where your surgery  will be performed.  7.  Thoroughly rinse your body with warm water from the neck down.  8.  DO NOT shower/wash with your normal soap after using and rinsing off  the CHG Soap.                9.  Pat yourself dry with a clean towel.            10.  Wear clean pajamas.            11.  Place clean sheets on your bed the night of your first shower and do not  sleep with pets. Day  of Surgery : Do not apply any lotions/deodorants the morning of surgery.  Please wear clean clothes to the hospital/surgery center.  FAILURE TO FOLLOW THESE INSTRUCTIONS MAY RESULT IN THE CANCELLATION OF YOUR SURGERY PATIENT SIGNATURE_________________________________  NURSE SIGNATURE__________________________________  ________________________________________________________________________

## 2013-09-08 NOTE — Progress Notes (Signed)
Patient wife called, pt is allergic to hibiclens  Causes hives must use ivory soap, wife instructed for patient to use ivory soap for pre op showers.

## 2013-09-11 ENCOUNTER — Ambulatory Visit (HOSPITAL_COMMUNITY): Payer: Medicare Other | Admitting: Anesthesiology

## 2013-09-11 ENCOUNTER — Encounter (HOSPITAL_COMMUNITY): Payer: Medicare Other | Admitting: Anesthesiology

## 2013-09-11 ENCOUNTER — Encounter (HOSPITAL_COMMUNITY)
Admission: RE | Disposition: A | Discharge status: Home / Self Care | Payer: Self-pay | Source: Ambulatory Visit | Attending: Urology

## 2013-09-11 ENCOUNTER — Encounter (HOSPITAL_COMMUNITY): Payer: Self-pay | Admitting: *Deleted

## 2013-09-11 ENCOUNTER — Encounter (HOSPITAL_COMMUNITY): Payer: Self-pay | Admitting: Emergency Medicine

## 2013-09-11 ENCOUNTER — Ambulatory Visit (HOSPITAL_COMMUNITY)
Admission: RE | Admit: 2013-09-11 | Discharge: 2013-09-11 | Disposition: A | Discharge status: Home / Self Care | Payer: Medicare Other | Source: Ambulatory Visit | Attending: Urology | Admitting: Urology

## 2013-09-11 ENCOUNTER — Emergency Department (HOSPITAL_COMMUNITY)
Admission: EM | Admit: 2013-09-11 | Discharge: 2013-09-11 | Disposition: A | Discharge status: Home / Self Care | Payer: Medicare Other | Attending: Emergency Medicine | Admitting: Emergency Medicine

## 2013-09-11 DIAGNOSIS — E7800 Pure hypercholesterolemia, unspecified: Secondary | ICD-10-CM | POA: Diagnosis not present

## 2013-09-11 DIAGNOSIS — N3941 Urge incontinence: Secondary | ICD-10-CM

## 2013-09-11 DIAGNOSIS — Z7982 Long term (current) use of aspirin: Secondary | ICD-10-CM | POA: Insufficient documentation

## 2013-09-11 DIAGNOSIS — Z794 Long term (current) use of insulin: Secondary | ICD-10-CM | POA: Insufficient documentation

## 2013-09-11 DIAGNOSIS — Z792 Long term (current) use of antibiotics: Secondary | ICD-10-CM | POA: Diagnosis not present

## 2013-09-11 DIAGNOSIS — Z85828 Personal history of other malignant neoplasm of skin: Secondary | ICD-10-CM | POA: Insufficient documentation

## 2013-09-11 DIAGNOSIS — K219 Gastro-esophageal reflux disease without esophagitis: Secondary | ICD-10-CM | POA: Insufficient documentation

## 2013-09-11 DIAGNOSIS — Z885 Allergy status to narcotic agent status: Secondary | ICD-10-CM | POA: Insufficient documentation

## 2013-09-11 DIAGNOSIS — Z881 Allergy status to other antibiotic agents status: Secondary | ICD-10-CM | POA: Insufficient documentation

## 2013-09-11 DIAGNOSIS — R350 Frequency of micturition: Secondary | ICD-10-CM | POA: Insufficient documentation

## 2013-09-11 DIAGNOSIS — Z888 Allergy status to other drugs, medicaments and biological substances status: Secondary | ICD-10-CM | POA: Diagnosis not present

## 2013-09-11 DIAGNOSIS — I2510 Atherosclerotic heart disease of native coronary artery without angina pectoris: Secondary | ICD-10-CM | POA: Insufficient documentation

## 2013-09-11 DIAGNOSIS — E119 Type 2 diabetes mellitus without complications: Secondary | ICD-10-CM | POA: Insufficient documentation

## 2013-09-11 DIAGNOSIS — Z79899 Other long term (current) drug therapy: Secondary | ICD-10-CM | POA: Insufficient documentation

## 2013-09-11 DIAGNOSIS — E785 Hyperlipidemia, unspecified: Secondary | ICD-10-CM | POA: Diagnosis not present

## 2013-09-11 DIAGNOSIS — Z8546 Personal history of malignant neoplasm of prostate: Secondary | ICD-10-CM | POA: Diagnosis not present

## 2013-09-11 DIAGNOSIS — Z8669 Personal history of other diseases of the nervous system and sense organs: Secondary | ICD-10-CM | POA: Diagnosis not present

## 2013-09-11 DIAGNOSIS — Z872 Personal history of diseases of the skin and subcutaneous tissue: Secondary | ICD-10-CM | POA: Diagnosis not present

## 2013-09-11 DIAGNOSIS — Z96659 Presence of unspecified artificial knee joint: Secondary | ICD-10-CM | POA: Insufficient documentation

## 2013-09-11 DIAGNOSIS — I4891 Unspecified atrial fibrillation: Secondary | ICD-10-CM | POA: Diagnosis not present

## 2013-09-11 DIAGNOSIS — H9190 Unspecified hearing loss, unspecified ear: Secondary | ICD-10-CM | POA: Diagnosis not present

## 2013-09-11 DIAGNOSIS — N320 Bladder-neck obstruction: Secondary | ICD-10-CM | POA: Insufficient documentation

## 2013-09-11 DIAGNOSIS — Z87442 Personal history of urinary calculi: Secondary | ICD-10-CM | POA: Insufficient documentation

## 2013-09-11 DIAGNOSIS — Z9889 Other specified postprocedural states: Secondary | ICD-10-CM | POA: Diagnosis not present

## 2013-09-11 DIAGNOSIS — N318 Other neuromuscular dysfunction of bladder: Secondary | ICD-10-CM | POA: Insufficient documentation

## 2013-09-11 DIAGNOSIS — E039 Hypothyroidism, unspecified: Secondary | ICD-10-CM | POA: Insufficient documentation

## 2013-09-11 DIAGNOSIS — N529 Male erectile dysfunction, unspecified: Secondary | ICD-10-CM | POA: Insufficient documentation

## 2013-09-11 DIAGNOSIS — Z91041 Radiographic dye allergy status: Secondary | ICD-10-CM | POA: Insufficient documentation

## 2013-09-11 DIAGNOSIS — M1990 Unspecified osteoarthritis, unspecified site: Secondary | ICD-10-CM | POA: Diagnosis not present

## 2013-09-11 DIAGNOSIS — J45909 Unspecified asthma, uncomplicated: Secondary | ICD-10-CM | POA: Diagnosis not present

## 2013-09-11 DIAGNOSIS — R351 Nocturia: Secondary | ICD-10-CM | POA: Diagnosis not present

## 2013-09-11 DIAGNOSIS — M129 Arthropathy, unspecified: Secondary | ICD-10-CM | POA: Insufficient documentation

## 2013-09-11 HISTORY — PX: CYSTOSCOPY WITH INJECTION: SHX1424

## 2013-09-11 LAB — URINALYSIS, ROUTINE W REFLEX MICROSCOPIC
Bilirubin Urine: NEGATIVE
Glucose, UA: NEGATIVE mg/dL
Ketones, ur: NEGATIVE mg/dL
NITRITE: NEGATIVE
Protein, ur: 30 mg/dL — AB
SPECIFIC GRAVITY, URINE: 1.021 (ref 1.005–1.030)
Urobilinogen, UA: 0.2 mg/dL (ref 0.0–1.0)
pH: 5 (ref 5.0–8.0)

## 2013-09-11 LAB — GLUCOSE, CAPILLARY
GLUCOSE-CAPILLARY: 117 mg/dL — AB (ref 70–99)
Glucose-Capillary: 148 mg/dL — ABNORMAL HIGH (ref 70–99)

## 2013-09-11 LAB — URINE MICROSCOPIC-ADD ON

## 2013-09-11 SURGERY — CYSTOSCOPY, WITH INJECTION OF BLADDER NECK OR BLADDER WALL
Anesthesia: General

## 2013-09-11 MED ORDER — LACTATED RINGERS IV SOLN
INTRAVENOUS | Status: DC
  Administered 2013-09-11: 1000 mL via INTRAVENOUS

## 2013-09-11 MED ORDER — LIDOCAINE HCL 2 % EX GEL
CUTANEOUS | Status: AC
  Filled 2013-09-11: qty 10

## 2013-09-11 MED ORDER — ONABOTULINUMTOXINA 100 UNITS IJ SOLR
300.0000 [IU] | Freq: Once | INTRAMUSCULAR | Status: DC
  Filled 2013-09-11 (×2): qty 300

## 2013-09-11 MED ORDER — LIDOCAINE HCL (CARDIAC) 10 MG/ML IV SOLN
INTRAVENOUS | Status: DC | PRN
  Administered 2013-09-11: 50 mg via INTRAVENOUS

## 2013-09-11 MED ORDER — ONDANSETRON HCL 4 MG/2ML IJ SOLN
INTRAMUSCULAR | Status: AC
  Filled 2013-09-11: qty 2

## 2013-09-11 MED ORDER — LIDOCAINE HCL 2 % EX GEL
CUTANEOUS | Status: DC | PRN
  Administered 2013-09-11: 1 via URETHRAL

## 2013-09-11 MED ORDER — SODIUM CHLORIDE 0.9 % IR SOLN
Status: DC | PRN
  Administered 2013-09-11: 1000 mL via INTRAVESICAL

## 2013-09-11 MED ORDER — PROPOFOL 10 MG/ML IV BOLUS
INTRAVENOUS | Status: DC | PRN
  Administered 2013-09-11: 150 mg via INTRAVENOUS

## 2013-09-11 MED ORDER — CIPROFLOXACIN IN D5W 400 MG/200ML IV SOLN
INTRAVENOUS | Status: AC
  Filled 2013-09-11: qty 200

## 2013-09-11 MED ORDER — ONDANSETRON HCL 4 MG/2ML IJ SOLN
INTRAMUSCULAR | Status: DC | PRN
  Administered 2013-09-11: 4 mg via INTRAVENOUS

## 2013-09-11 MED ORDER — BELLADONNA ALKALOIDS-OPIUM 16.2-60 MG RE SUPP
RECTAL | Status: AC
  Filled 2013-09-11: qty 1

## 2013-09-11 MED ORDER — FENTANYL CITRATE 0.05 MG/ML IJ SOLN: 25.0000 ug | INTRAMUSCULAR | Status: DC | PRN

## 2013-09-11 MED ORDER — HYOSCYAMINE SULFATE 0.125 MG SL SUBL: 0.1250 mg | SUBLINGUAL_TABLET | SUBLINGUAL | Status: DC | PRN

## 2013-09-11 MED ORDER — LIDOCAINE HCL (CARDIAC) 20 MG/ML IV SOLN
INTRAVENOUS | Status: AC
  Filled 2013-09-11: qty 5

## 2013-09-11 MED ORDER — CIPROFLOXACIN IN D5W 400 MG/200ML IV SOLN: 400.0000 mg | INTRAVENOUS | Status: DC

## 2013-09-11 MED ORDER — FENTANYL CITRATE 0.05 MG/ML IJ SOLN
INTRAMUSCULAR | Status: DC | PRN
  Administered 2013-09-11 (×2): 50 ug via INTRAVENOUS

## 2013-09-11 MED ORDER — ONABOTULINUMTOXINA 100 UNITS IJ SOLR
INTRAMUSCULAR | Status: DC | PRN
  Administered 2013-09-11: 100 [IU] via INTRAMUSCULAR

## 2013-09-11 MED ORDER — SODIUM CHLORIDE 0.9 % IJ SOLN
INTRAMUSCULAR | Status: AC
  Filled 2013-09-11: qty 30

## 2013-09-11 MED ORDER — CIPROFLOXACIN IN D5W 400 MG/200ML IV SOLN
400.0000 mg | Freq: Two times a day (BID) | INTRAVENOUS | Status: AC
  Administered 2013-09-11: 400 mg via INTRAVENOUS

## 2013-09-11 MED ORDER — FENTANYL CITRATE 0.05 MG/ML IJ SOLN
INTRAMUSCULAR | Status: AC
  Filled 2013-09-11: qty 2

## 2013-09-11 MED ORDER — PROPOFOL 10 MG/ML IV BOLUS
INTRAVENOUS | Status: AC
  Filled 2013-09-11: qty 20

## 2013-09-11 SURGICAL SUPPLY — 20 items
BAG URO CATCHER STRL LF (DRAPE) ×2 IMPLANT
CATH ROBINSON RED A/P 14FR (CATHETERS) ×1 IMPLANT
CLOTH BEACON ORANGE TIMEOUT ST (SAFETY) ×2 IMPLANT
DEFLUX NEEDLE (NEEDLE) IMPLANT
DEFLUX SYRINGE (SYRINGE) IMPLANT
DRAPE CAMERA CLOSED 9X96 (DRAPES) ×2 IMPLANT
GLOVE BIOGEL M STRL SZ7.5 (GLOVE) ×2 IMPLANT
GOWN STRL REUS W/TWL LRG LVL3 (GOWN DISPOSABLE) ×2 IMPLANT
GOWN STRL REUS W/TWL XL LVL3 (GOWN DISPOSABLE) ×2 IMPLANT
IV NS IRRIG 3000ML ARTHROMATIC (IV SOLUTION) ×1 IMPLANT
MANIFOLD NEPTUNE II (INSTRUMENTS) ×2 IMPLANT
NDL SAFETY ECLIPSE 18X1.5 (NEEDLE) IMPLANT
NDL SPNL 22GX7 QUINCKE BK (NEEDLE) ×1 IMPLANT
NEEDLE HYPO 18GX1.5 SHARP (NEEDLE) ×2
NEEDLE SPNL 22GX7 QUINCKE BK (NEEDLE) ×2 IMPLANT
PACK CYSTO (CUSTOM PROCEDURE TRAY) ×2 IMPLANT
SCRUB PCMX 4 OZ (MISCELLANEOUS) ×1 IMPLANT
SYR CONTROL 10ML LL (SYRINGE) ×1 IMPLANT
TUBING CONNECTING 10 (TUBING) ×1 IMPLANT
WATER STERILE IRR 3000ML UROMA (IV SOLUTION) ×1 IMPLANT

## 2013-09-11 NOTE — Discharge Instructions (Signed)
Cystoscopy, Care After Refer to this sheet in the next few weeks. These instructions provide you with information on caring for yourself after your procedure. Your caregiver may also give you more specific instructions. Your treatment has been planned according to current medical practices, but problems sometimes occur. Call your caregiver if you have any problems or questions after your procedure. HOME CARE INSTRUCTIONS  Things you can do to ease any discomfort after your procedure include:  Drinking enough water and fluids to keep your urine clear or pale yellow.  Taking a warm bath to relieve any burning feelings. SEEK IMMEDIATE MEDICAL CARE IF:   You have an increase in blood in your urine.  You notice blood clots in your urine.  You have difficulty passing urine.  You have the chills.  You have abdominal pain.  You have a fever or persistent symptoms for more than 2-3 days.  You have a fever and your symptoms suddenly get worse. MAKE SURE YOU:   Understand these instructions.  Will watch your condition.  Will get help right away if you are not doing well or get worse. Document Released: 07/18/2004 Document Revised: 08/31/2012 Document Reviewed: 06/22/2011 [facility] Patient Information 2015 [place], [place]. This information is not intended to replace advice given to you by your health care provider. Make sure you discuss any questions you have with your health care provider.  Make Follow up appointment with Dr Gaynelle Arabian as instructed. Office phone [phone].

## 2013-09-11 NOTE — Anesthesia Postprocedure Evaluation (Signed)
  Anesthesia Post-op Note  Patient: [name]  Procedure(s) Performed: Procedure(s) (LRB): CYSTOSCOPY WITH BOTOX INJECTION INTO THE BLADDER (N/A)  Patient Location: PACU  Anesthesia Type: General  Level of Consciousness: awake and alert   Airway and Oxygen Therapy: Patient Spontanous Breathing  Post-op Pain: mild  Post-op Assessment: Post-op Vital signs reviewed, Patient's Cardiovascular Status Stable, Respiratory Function Stable, Patent Airway and No signs of Nausea or vomiting  Last Vitals:  Filed Vitals:   09/11/13 1051  BP: 147/79  Pulse: 57  Temp: 36.4 C  Resp: 12    Post-op Vital Signs: stable   Complications: No apparent anesthesia complications

## 2013-09-11 NOTE — ED Notes (Signed)
Pt states he was passing blood and clots the first couple times he urinated but that has cleared up

## 2013-09-11 NOTE — ED Notes (Signed)
Bladder scan reading after voiding = 0 ml.

## 2013-09-11 NOTE — Progress Notes (Signed)
Per dr Gaynelle Arabian, I&O cath then pt may be discharged  Pt informed if unable to empty bladder at any time, will need to return to emergency room

## 2013-09-11 NOTE — ED Provider Notes (Signed)
CSN: [id number]     Arrival date & time 09/11/13  1956 History   First MD Initiated Contact with Patient 09/11/13 2300     Chief Complaint  Patient presents with  . Urinary Frequency     (Consider location/radiation/quality/duration/timing/severity/associated sxs/prior Treatment) HPI 80 year old male presents with urinary frequency since having a Botox surgery for his bladder this morning. Patient states he's been having urinary frequency and going frequently at night prior to surgery. Since the surgery he was able to hold his urine for about an hour and was less discharge. Afterwards he had some initial hematuria that is clearing. Has mild pain is also improving. However he is going every 15 minutes or so. He feels the urge to urinate but is unable to make it to the bathroom.  Past Medical History  Diagnosis Date  . History of skin cancer   . Hypothyroidism   . Asthmatic bronchitis   . Allergic rhinitis   . Urticaria   . Dyslipidemia   . DM type 2 (diabetes mellitus, type 2)   . Macular degeneration     injections done bilaterally recent - 08-22-13  . Dysrhythmia     '97 tachycardia- no recent issues  . Arthritis     arthritis-kyphosis  . GERD (gastroesophageal reflux disease)   . 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 Urgency-frequency syndrome   . History of kidney stones   . Hearing impaired     bilateral hearing aids   Past Surgical History  Procedure Laterality Date  . Popliteal synovial cyst excision  2005  . Gallbladder surgery  2006    no cholecystectomy  . Total knee arthroplasty  2008    right  . Total knee arthroplasty  2010    left  . Partial thymectomy  1985    for nodules  . Lung removal, partial  age 42    for bronchiectasis  . Cataract surgery Bilateral   . Colonoscopy w/ polypectomy      x2   . Cystoscopy with retrograde pyelogram, ureteroscopy and stent placement    . Back  surgery      lumbar fracture  . Eye surgery      macular degeneration  . Hernia repair     Family History  Problem Relation Age of Onset  . Other Sister     ophth disease  . Heart attack Father     x7  . Heart disease Father     MI x7  . Hypertension Mother   . Stroke Mother 24   History  Substance Use Topics  . Smoking status: Never Smoker   . Smokeless tobacco: Never Used  . Alcohol Use: No    Review of Systems  Constitutional: Negative for fever.  Gastrointestinal: Negative for vomiting and abdominal pain.  Genitourinary: Positive for dysuria, frequency and hematuria.  All other systems reviewed and are negative.     Allergies  Colchicine; Atorvastatin; Ceftin; Codeine; Erythromycin; Ezetimibe; Hibiclens; Nitroglycerin; Oxycodone-acetaminophen; Rosuvastatin; Simvastatin; Vantin; Contrast media; and Tetanus toxoid  Home Medications   Prior to Admission medications   Medication Sig Start Date End Date Taking? Authorizing Provider  acetaminophen (TYLENOL) 500 MG tablet Take 1,000 mg by mouth every 6 (six) hours as needed for mild pain or moderate pain.   Yes Historical Provider, MD  albuterol (PROVENTIL HFA;VENTOLIN HFA) 108 (90 BASE) MCG/ACT inhaler Inhale 2 puffs into the lungs every 6 (six) hours  as needed for wheezing or shortness of breath.   Yes Historical Provider, MD  aspirin 81 MG tablet Take 81 mg by mouth daily.     Yes Historical Provider, MD  benzonatate (TESSALON) 100 MG capsule Take 100 mg by mouth 2 (two) times daily as needed for cough.   Yes Historical Provider, MD  Cholecalciferol (VITAMIN D) 2000 UNITS tablet Take 2,000 Units by mouth daily.   Yes Historical Provider, MD  clindamycin (CLEOCIN) 150 MG capsule Take 150 mg by mouth 3 (three) times daily. Two capsules by mouth immediately. Then one capsule every six hours until finished. 09/07/13  Yes Historical Provider, MD  colesevelam (WELCHOL) 625 MG tablet Take 1,250 mg by mouth 3 (three) times daily.    Yes Historical Provider, MD  digoxin (LANOXIN) 0.25 MG tablet Take 0.25 mg by mouth every evening.   Yes Historical Provider, MD  diltiazem (CARDIZEM CD) 240 MG 24 hr capsule Take 240 mg by mouth every evening.   Yes Historical Provider, MD  furosemide (LASIX) 20 MG tablet Take 20 mg by mouth every evening.   Yes Historical Provider, MD  insulin glargine (LANTUS) 100 UNIT/ML injection Inject 10-20 Units into the skin at bedtime.    Yes Historical Provider, MD  insulin regular (NOVOLIN R,HUMULIN R) 100 units/mL injection Inject 5-7 Units into the skin 3 (three) times daily before meals.   Yes Historical Provider, MD  levothyroxine (SYNTHROID) 200 MCG tablet Take 200 mcg by mouth daily before breakfast. Take with 75 mcg tablet   Yes Historical Provider, MD  levothyroxine (SYNTHROID, LEVOTHROID) 75 MCG tablet Take 75 mcg by mouth daily before breakfast. Take with 200 mcg tablet   Yes Historical Provider, MD  Multiple Vitamins-Minerals (OCUVITE EXTRA PO) Take 1 tablet by mouth daily.     Yes Historical Provider, MD  omeprazole (PRILOSEC OTC) 20 MG tablet Take 20 mg by mouth daily.     Yes Historical Provider, MD  polyethylene glycol (MIRALAX / GLYCOLAX) packet Take 17 g by mouth daily.   Yes Historical Provider, MD  zolpidem (AMBIEN) 5 MG tablet Take 5 mg by mouth at bedtime as needed for sleep.   Yes Historical Provider, MD   BP 158/83  Pulse 71  Temp(Src) 98.2 F (36.8 C) (Oral)  Resp 16  SpO2 97% Physical Exam  Nursing note and vitals reviewed. Constitutional: He is oriented to person, place, and time. He appears well-developed and well-nourished.  HENT:  Head: Normocephalic and atraumatic.  Right Ear: External ear normal.  Left Ear: External ear normal.  Nose: Nose normal.  Eyes: Right eye exhibits no discharge. Left eye exhibits no discharge.  Cardiovascular: Normal rate, regular rhythm, normal heart sounds and intact distal pulses.   Pulmonary/Chest: Effort normal.  Abdominal: Soft.  He exhibits no distension. There is no tenderness.  Genitourinary: Testes normal and penis normal. Circumcised. No penile tenderness.  Neurological: He is alert and oriented to person, place, and time.  Skin: Skin is warm and dry.    ED Course  Procedures (including critical care time) Labs Review Labs Reviewed  URINALYSIS, ROUTINE W REFLEX MICROSCOPIC - Abnormal; Notable for the following:    Color, Urine AMBER (*)    APPearance CLOUDY (*)    Hgb urine dipstick LARGE (*)    Protein, ur 30 (*)    Leukocytes, UA SMALL (*)    All other components within normal limits  URINE MICROSCOPIC-ADD ON    Imaging Review No results found.   EKG Interpretation  None      MDM   Final diagnoses:  Urinary frequency    No UTI. Patient is well appearing and comfortable. Exam benign. D/w Dr. Jeffie Pollock, who suggests Levsin 0.125 mg SL q4-6 hr prn and f/u with Tannenbaum in the AM.     Ephraim Hamburger, MD 09/11/13 2328

## 2013-09-11 NOTE — Op Note (Signed)
Pre-operative diagnosis :   Overactive bladder with urinary frequency and urgency and urge incontinence (late affect her radiation)  Postoperative diagnosis: Same   Operation:  Cystourethroscopy, Botox submucosal bladder and injection (200 units)   Surgeon:  S. Gaynelle Arabian, MD  First assistant:  None   Anesthesia:  General LMA   Preparation:  After appropriate preanesthesia, the patient was brought to the operative room, placed on the operating table in the dorsal supine position where general LMA anesthesia was introduced. The armband was double checked. The history was double checked.   Review history:  Active Problems  Problems  1. Bilateral kidney stones (592.0)   Assessed By: Hessie Diener (Urology); Last Assessed: 07 Jan 2010  2. Bladder neck contracture (596.0)  3. Erectile dysfunction due to arterial insufficiency (607.84)   Assessed By: Rolan Bucco (Urology); Last Assessed: 20 Jul 2011  4. Late effect of radiation (909.2)   Assessed By: Carolan Clines (Urology); Last Assessed: 21 Aug 2013  5. Nocturia (788.43)   Assessed By: Carolan Clines (Urology); Last Assessed: 21 Aug 2013  6. Overactive bladder (596.51)   Assessed By: Carolan Clines (Urology); Last Assessed: 21 Aug 2013  7. Personal history of prostate cancer (V10.46)   Assessed By: Carolan Clines (Urology); Last Assessed: 21 Aug 2013  8. Renal cyst, acquired (593.2)   Assessed By: Hessie Diener (Urology); Last Assessed: 24 Aug 2007  History of Present Illness  80 yo male (former patient of Drs. Peterson/Woodruff') returns today for a yearly f/u with a hx of:  #1 Nocturia  Duration: Chronic  Location: bladder  Quality: sudden  Severity: 3-4x/night-10-12  Associated symptoms:  Positive:  Negative: gross hematuria  Timing:  Modifying factors:  Exacerbated by: Soda & tea in the evenings.  Relieved by:  Context: AUA SS today is 12 w/ QoL 2.  #2. ED  Duration: Chronic   Location: penis  Quality:  Severity: severe  Associated symptoms:  Positive: Peyronie's disease- curves to left.  Negative:  Timing:  Modifying factors:  Exacerbated by: CAD  Relieved by:  Context: This patient has coronary artery disease. He had hypotension with use of nitroglycerin. He states he tried fiber in the past that worked well. We discussed management options including urethral suppositories, other oral medications, penile injection, and vacuum-assisted device. We discussed the use of generic sildenafil and that this is off label use by the FDA. We discussed the risks, benefits, and side effects and what to do if he developed priapism.  #3. Urgency  Duration: chronic  Location: SP area of abdomen  Quality: sharp, sudden  Severity: mild.  Associated symptoms:  Positive:  Negative:  Timing: 2x/week  Modifying factors:  Exacerbated by:  Relieved by: Anitcholinergic meds have not helped in the past.  Context: Postvoid residual by ultrasound today was 0 indicating that he empties his bladder well.  #4. Prostate cancer  Duration: Since 2007  Location: Prostate  Quality:  Severity: Gleason 6  Associated symptoms:  Positive:  Negative: gross hematuria  Timing:  Modifying factors:  Exacerbated by:  Relieved by: Gleason 6.  Context: Last PSA on 08/12/12 was 0.54.  Past Medical History  Problems  1. History of Atrial fibrillation (427.31)  2. History of diabetes mellitus (V12.29)  3. History of esophageal reflux (V12.79)  4. History of hypercholesterolemia (V12.29)  5. Personal history of prostate cancer (V10.46)  6. History of Skin Cancer (V10.83)    Statement of  Likelihood of Success: Excellent. TIME-OUT observed.:  Procedure:  Cystourethroscopy was accomplished, and 20 injections of 1 cc each of Botox was injected in the bladder submucosa, above the level of the trigone, on the right and left lateral walls. The patient had excellent submucosal ballooning of the  tissue. There was no bleeding noted. The bladder was drained of fluid. The patient was awakened and taken to recovery room in good condition. Xylocaine jelly was placed in the urethra. He was covered with IV Cipro.

## 2013-09-11 NOTE — H&P (Signed)
Reason For Visit Yearly f/u   Active Problems Problems  1. Bilateral kidney stones (592.0)   Assessed By: Hessie Diener (Urology); Last Assessed: 07 Jan 2010 2. Bladder neck contracture (596.0) 3. Erectile dysfunction due to arterial insufficiency (607.84)   Assessed By: Rolan Bucco (Urology); Last Assessed: 20 Jul 2011 4. Late effect of radiation (909.2)   Assessed By: Carolan Clines (Urology); Last Assessed: 21 Aug 2013 5. Nocturia (788.43)   Assessed By: Carolan Clines (Urology); Last Assessed: 21 Aug 2013 6. Overactive bladder (596.51)   Assessed By: Carolan Clines (Urology); Last Assessed: 21 Aug 2013 7. Personal history of prostate cancer (V10.46)   Assessed By: Carolan Clines (Urology); Last Assessed: 21 Aug 2013 8. Renal cyst, acquired (593.2)   Assessed By: Hessie Diener (Urology); Last Assessed: 24 Aug 2007  History of Present Illness     80 yo male (former patient of Drs. Peterson/Woodruff') returns today for a yearly f/u with a hx of:      #1 Nocturia   Duration: Chronic   Location: bladder   Quality: sudden   Severity: 3-4x/night-10-12   Associated symptoms:    Positive:    Negative: gross hematuria   Timing:   Modifying factors:     Exacerbated by: Soda & tea in the evenings.    Relieved by:   Context: AUA SS today is 12 w/ QoL 2.    #2. ED   Duration: Chronic   Location: penis   Quality:   Severity: severe   Associated symptoms:    Positive: Peyronie's disease- curves to left.    Negative:   Timing:   Modifying factors:     Exacerbated by: CAD    Relieved by:   Context: This patient has coronary artery disease. He had hypotension with use of nitroglycerin. He states he tried fiber in the past that worked well. We discussed management options including urethral suppositories, other oral medications, penile injection, and vacuum-assisted device. We discussed the use of  generic sildenafil and that this is off label use by the FDA. We discussed the risks, benefits, and side effects and what to do if he developed priapism.        #3. Urgency   Duration: chronic   Location: SP area of abdomen   Quality: sharp, sudden   Severity: mild.    Associated symptoms:    Positive:    Negative:   Timing: 2x/week   Modifying factors:     Exacerbated by:    Relieved by: Anitcholinergic meds have not helped in the past.   Context: Postvoid residual by ultrasound today was 0 indicating that he empties his bladder well.        #4. Prostate cancer   Duration: Since 2007   Location: Prostate   Quality:   Severity: Gleason 6   Associated symptoms:    Positive:    Negative: gross hematuria   Timing:   Modifying factors:     Exacerbated by:    Relieved by: Gleason 6.    Context: Last PSA on 08/12/12 was 0.54.   Past Medical History Problems  1. History of Atrial fibrillation (427.31) 2. History of diabetes mellitus (V12.29) 3. History of esophageal reflux (V12.79) 4. History of hypercholesterolemia (V12.29) 5. Personal history of prostate cancer (V10.46) 6. History of Skin Cancer (V10.83)  Surgical History Problems  1. History of Cataract Surgery 2. History of Colonoscopy (Fiberoptic)  3. History of Knee Replacement 4. History of Lung Surgery 5. History of Thyroid Surgery  Current Meds 1. Aspirin 81 MG Oral Tablet Chewable;  Therapy: (Recorded:22Jan2008) to Recorded 2. BD Insulin Syr Ultrafine II 31G X 5/16" 1 ML Miscellaneous;  Therapy: 22Aug2011 to Recorded 3. Cartia XT 240 MG Oral Capsule Extended Release 24 Hour;  Therapy: (OHYWVPXT:06YIR4854) to Recorded 4. Digoxin 250 MCG Oral Tablet;  Therapy: (Recorded:22Jan2008) to Recorded 5. Diltiazem HCl TABS;  Therapy: (Recorded:22Jan2008) to Recorded 6. Furosemide 20 MG Oral Tablet;  Therapy: 04Aug2011 to Recorded 7. HumuLIN R 100 UNIT/ML Injection Solution;   Therapy: (OEVOJJKK:93GHW2993) to Recorded 8. Lantus 100 UNIT/ML Subcutaneous Solution;  Therapy: (ZJIRCVEL:38BOF7510) to Recorded 9. MiraLax Oral Powder;  Therapy: (CHENIDPO:24MPN3614) to Recorded 10. Ocuvite TABS;   Therapy: (ERXVQMGQ:67YPP5093) to Recorded 11. Pantoprazole Sodium 40 MG Oral Tablet Delayed Release;   Therapy: 03Dec2011 to Recorded 12. Synthroid 50 MCG Oral Tablet;   Therapy: (OIZTIWPY:09XIP3825) to Recorded 13. Vitamin D CAPS;   Therapy: (KNLZJQBH:41PFX9024) to Recorded 14. Welchol 625 MG Oral Tablet;   Therapy: (OXBDZHGD:92EQA8341) to Recorded 15. Zolpidem Tartrate 5 MG Oral Tablet;   Therapy: [phone] to Recorded  Allergies Medication  1. Tetanus Toxoid Adsorbed SOLN 2. Codeine Derivatives 3. Flomax CP24 4. Ceftin TABS 5. Colchicine TABS 6. Erythromycin TABS 7. Nitrostat SUBL 8. Vantin TABS Non-Medication  9. Contrast Dye  Family History Problems  1. Family history of Acute Myocardial Infarction : Father 2. Family history of Death In The Family Father : Father   MIAge 03/27/60. Family history of Death In The Family Mother : Mother   Age 78 4. Family history of Family Health Status Number Of Children   1 son and 1 daughter 5. Family history of Ischemic Stroke (V17.1) : Mother  Social History Problems  1. Denied: History of Alcohol Use 2. Caffeine Use   2 per day 3. Marital History - Currently Married 4. Never A Smoker 5. Occupation:   retired Set designer  Review of Systems Genitourinary, constitutional, skin, eye, otolaryngeal, hematologic/lymphatic, cardiovascular, pulmonary, endocrine, musculoskeletal, gastrointestinal, neurological and psychiatric system(s) were reviewed and pertinent findings if present are noted.  Genitourinary: urinary frequency, feelings of urinary urgency, nocturia and incomplete emptying of bladder, but urine stream is not weak, urinary stream does not start and stop and initiating urination does not require  straining.    Vitals Vital Signs [Data Includes: Last 1 Day]  Recorded: 10Aug2015 02:27PM  Height: 5 ft 11 in Weight: 161 lb  BMI Calculated: 22.46 BSA Calculated: 1.92 Blood Pressure: 152 / 89 Heart Rate: 72  Physical Exam Constitutional: Well nourished and well developed . No acute distress. The patient appears well hydrated.  ENT:. The ears and nose are normal in appearance.  Neck: The appearance of the neck is normal.  Pulmonary: No respiratory distress.  Cardiovascular:. No peripheral edema.  Abdomen: The abdomen is flat. No masses are palpated. No hernias are palpable.  Rectal: Rectal exam demonstrates decreased sphincter tone. The prostate is smooth and flat. The prostate has no nodularity and is not indurated involving the entire right lobe  of the prostate which appears to be confined within the prostate capsule. The left seminal vesicle is not tender and without masses. The right seminal vesicle is without masses and not tender.  Genitourinary: Examination of the penis demonstrates no discharge, no masses, no lesions and a normal meatus. The penis is uncircumcised. The scrotum is without lesions. The right epididymis is palpably normal and non-tender. The left epididymis  is palpably normal and non-tender. The right testis is non-tender and without masses. The left testis is non-tender and without masses.  Lymphatics: The femoral and inguinal nodes are not enlarged or tender.  Skin: Normal skin turgor, no visible rash and no visible skin lesions.  Neuro/Psych:. Mood and affect are appropriate.    Results/Data Urine [Data Includes: Last 1 Day]   [id number]  COLOR YELLOW   APPEARANCE CLEAR   SPECIFIC GRAVITY 1.020   pH 5.5   GLUCOSE NEG mg/dL  BILIRUBIN NEG   KETONE NEG mg/dL  BLOOD NEG   PROTEIN NEG mg/dL  UROBILINOGEN 0.2 mg/dL  NITRITE NEG   LEUKOCYTE ESTERASE NEG    Assessment Assessed  1. Nocturia (788.43) 2. Urge incontinence of urine (788.31) 3. Personal  history of prostate cancer (V10.46) 4. 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 Overactive bladder (596.51)   80 yo male with hx of G-6 CaP, post EBRT in 2007, with post op frequency, and nocturia, and radiation cystitis/prostatitis, failing Enablex and Sanctura, and now failing   PTNS 1  in 2011. He is c/o severe nocturia, and some daytime incontinence . No evidence of infection.    We have discussed the options that he has worked thru-Level I-II and Level II-III treatments, and believe that He is now ready for Botox injection in his bladder. We have discussed that I would start with a low "average" dose for OAB,200 units, although this may require a higher dose in the future.   1 Amended By: Carolan Clines; Sep 11 2013 8:44 AM EST  Plan Health Maintenance  1. UA With REFLEX; [Do Not Release]; Status:Resulted - Requires  Verification;   Done: [id number] 02:05PM Late effect of radiation, Overactive bladder, PMH: Personal history of prostate cancer, Urge incontinence of urine  2. Follow-up Schedule Surgery Office  Follow-up  Status: Hold For -  Appointment  Requested for: [phone]  Cysto, and bladder Botox injection.   Signatures Electronically signed by : Carolan Clines, M.D.; Aug 21 2013  2:57PM EST  Reason For Visit Yearly f/u   Active Problems Problems  1. Bilateral kidney stones (592.0)   Assessed By: Hessie Diener (Urology); Last Assessed: 07 Jan 2010 2. Bladder neck contracture (596.0) 3. Erectile dysfunction due to arterial insufficiency (607.84)   Assessed By: Rolan Bucco (Urology); Last Assessed: 20 Jul 2011 4. Late effect of radiation (909.2)   Assessed By: Carolan Clines (Urology); Last Assessed: 21 Aug 2013 5. Nocturia (788.43)   Assessed By: Carolan Clines (Urology); Last Assessed: 21 Aug 2013 6. Overactive bladder (596.51)   Assessed By: Carolan Clines (Urology); Last Assessed: 21 Aug 2013 7. Personal history of  prostate cancer (V10.46)   Assessed By: Carolan Clines (Urology); Last Assessed: 21 Aug 2013 8. Renal cyst, acquired (593.2)   Assessed By: Hessie Diener (Urology); Last Assessed: 24 Aug 2007  History of Present Illness     80 yo male (former patient of Drs. Peterson/Woodruff') returns today for a yearly f/u with a hx of:      #1 Nocturia   Duration: Chronic   Location: bladder   Quality: sudden   Severity: 3-4x/night-10-12   Associated symptoms:    Positive:    Negative: gross hematuria   Timing:   Modifying factors:     Exacerbated by: Soda & tea in the evenings.    Relieved by:   Context: AUA SS today is 12 w/  QoL 2.    #2. ED   Duration: Chronic   Location: penis   Quality:   Severity: severe   Associated symptoms:    Positive: Peyronie's disease- curves to left.    Negative:   Timing:   Modifying factors:     Exacerbated by: CAD    Relieved by:   Context: This patient has coronary artery disease. He had hypotension with use of nitroglycerin. He states he tried fiber in the past that worked well. We discussed management options including urethral suppositories, other oral medications, penile injection, and vacuum-assisted device. We discussed the use of generic sildenafil and that this is off label use by the FDA. We discussed the risks, benefits, and side effects and what to do if he developed priapism.        #3. Urgency   Duration: chronic   Location: SP area of abdomen   Quality: sharp, sudden   Severity: mild.    Associated symptoms:    Positive:    Negative:   Timing: 2x/week   Modifying factors:     Exacerbated by:    Relieved by: Anitcholinergic meds have not helped in the past.   Context: Postvoid residual by ultrasound today was 0 indicating that he empties his bladder well.        #4. Prostate cancer   Duration: Since 2007   Location: Prostate   Quality:   Severity: Gleason  6   Associated symptoms:    Positive:    Negative: gross hematuria   Timing:   Modifying factors:     Exacerbated by:    Relieved by: Gleason 6.    Context: Last PSA on 08/12/12 was 0.54.   Past Medical History Problems  1. History of Atrial fibrillation (427.31) 2. History of diabetes mellitus (V12.29) 3. History of esophageal reflux (V12.79) 4. History of hypercholesterolemia (V12.29) 5. Personal history of prostate cancer (V10.46) 6. History of Skin Cancer (V10.83)  Surgical History Problems  1. History of Cataract Surgery 2. History of Colonoscopy (Fiberoptic) 3. History of Knee Replacement 4. History of Lung Surgery 5. History of Thyroid Surgery  Current Meds 1. Aspirin 81 MG Oral Tablet Chewable;  Therapy: (Recorded:22Jan2008) to Recorded 2. BD Insulin Syr Ultrafine II 31G X 5/16" 1 ML Miscellaneous;  Therapy: 22Aug2011 to Recorded 3. Cartia XT 240 MG Oral Capsule Extended Release 24 Hour;  Therapy: (FBPZWCHE:52DPO2423) to Recorded 4. Digoxin 250 MCG Oral Tablet;  Therapy: (Recorded:22Jan2008) to Recorded 5. Diltiazem HCl TABS;  Therapy: (Recorded:22Jan2008) to Recorded 6. Furosemide 20 MG Oral Tablet;  Therapy: 04Aug2011 to Recorded 7. HumuLIN R 100 UNIT/ML Injection Solution;  Therapy: (NTIRWERX:54MGQ6761) to Recorded 8. Lantus 100 UNIT/ML Subcutaneous Solution;  Therapy: (PJKDTOIZ:12WPY0998) to Recorded 9. MiraLax Oral Powder;  Therapy: (PJASNKNL:97QBH4193) to Recorded 10. Ocuvite TABS;   Therapy: (XTKWIOXB:35HGD9242) to Recorded 11. Pantoprazole Sodium 40 MG Oral Tablet Delayed Release;   Therapy: 03Dec2011 to Recorded 12. Synthroid 50 MCG Oral Tablet;   Therapy: (ASTMHDQQ:22LNL8921) to Recorded 13. Vitamin D CAPS;   Therapy: (JHERDEYC:14GYJ8563) to Recorded 14. Welchol 625 MG Oral Tablet;   Therapy: (JSHFWYOV:78HYI5027) to Recorded 15. Zolpidem Tartrate 5 MG Oral Tablet;   Therapy: [phone] to Recorded  Allergies Medication  1. Tetanus  Toxoid Adsorbed SOLN 2. Codeine Derivatives 3. Flomax CP24 4. Ceftin TABS 5. Colchicine TABS 6. Erythromycin TABS 7. Nitrostat SUBL 8. Vantin TABS Non-Medication  9. Contrast Dye  Family History Problems  1. Family history of Acute Myocardial Infarction : Father 2. Family history of  Death In The Family Father : Father   MIAge 2060-03-25. Family history of Death In The Family Mother : Mother   Age 56 4. Family history of Family Health Status Number Of Children   1 son and 1 daughter 5. Family history of Ischemic Stroke (V17.1) : Mother  Social History Problems  1. Denied: History of Alcohol Use 2. Caffeine Use   2 per day 3. Marital History - Currently Married 4. Never A Smoker 5. Occupation:   retired Set designer  Review of Systems Genitourinary, constitutional, skin, eye, otolaryngeal, hematologic/lymphatic, cardiovascular, pulmonary, endocrine, musculoskeletal, gastrointestinal, neurological and psychiatric system(s) were reviewed and pertinent findings if present are noted.  Genitourinary: urinary frequency, feelings of urinary urgency, nocturia and incomplete emptying of bladder, but urine stream is not weak, urinary stream does not start and stop and initiating urination does not require straining.    Vitals Vital Signs [Data Includes: Last 1 Day]  Recorded: 10Aug2015 02:27PM  Height: 5 ft 11 in Weight: 161 lb  BMI Calculated: 22.46 BSA Calculated: 1.92 Blood Pressure: 152 / 89 Heart Rate: 72  Physical Exam Constitutional: Well nourished and well developed . No acute distress. The patient appears well hydrated.  ENT:. The ears and nose are normal in appearance.  Neck: The appearance of the neck is normal.  Pulmonary: No respiratory distress.  Cardiovascular:. No peripheral edema.  Abdomen: The abdomen is flat. No masses are palpated. No hernias are palpable.  Rectal: Rectal exam demonstrates decreased sphincter tone. The prostate is smooth and flat. The  prostate has no nodularity and is not indurated involving the entire right lobe  of the prostate which appears to be confined within the prostate capsule. The left seminal vesicle is not tender and without masses. The right seminal vesicle is without masses and not tender.  Genitourinary: Examination of the penis demonstrates no discharge, no masses, no lesions and a normal meatus. The penis is uncircumcised. The scrotum is without lesions. The right epididymis is palpably normal and non-tender. The left epididymis is palpably normal and non-tender. The right testis is non-tender and without masses. The left testis is non-tender and without masses.  Lymphatics: The femoral and inguinal nodes are not enlarged or tender.  Skin: Normal skin turgor, no visible rash and no visible skin lesions.  Neuro/Psych:. Mood and affect are appropriate.    Results/Data Urine [Data Includes: Last 1 Day]   [id number]  COLOR YELLOW   APPEARANCE CLEAR   SPECIFIC GRAVITY 1.020   pH 5.5   GLUCOSE NEG mg/dL  BILIRUBIN NEG   KETONE NEG mg/dL  BLOOD NEG   PROTEIN NEG mg/dL  UROBILINOGEN 0.2 mg/dL  NITRITE NEG   LEUKOCYTE ESTERASE NEG    Assessment Assessed  1. Nocturia (788.43) 2. Urge incontinence of urine (788.31) 3. Personal history of prostate cancer (V10.46) 4. 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 Overactive bladder (596.51)   80 yo male with hx of G-6 CaP, post EBRT in 2007, with post op frequency, and nocturia, and radiation cystitis/prostatitis, failing Enablex and Sanctura, and now failing   PTNS 1  in 2011. He is c/o severe nocturia, and some daytime incontinence . No evidence of infection.    We have discussed the options that he has worked thru-Level I-II and Level II-III treatments, and believe that He is now ready for Botox injection in his bladder. We have discussed that I would start with a low "average" dose for OAB,200 units, although this may require a higher dose  in the future.   1 Amended  By: Carolan Clines; Sep 11 2013 8:44 AM EST  Plan Health Maintenance  1. UA With REFLEX; [Do Not Release]; Status:Resulted - Requires  Verification;   Done: [id number] 02:05PM Late effect of radiation, Overactive bladder, PMH: Personal history of prostate cancer, Urge incontinence of urine  2. Follow-up Schedule Surgery Office  Follow-up  Status: Hold For -  Appointment  Requested for: [phone]  Cysto, and bladder Botox injection.   Signatures Electronically signed by : Carolan Clines, M.D.; Aug 21 2013  2:57PM EST  Reason For Visit Yearly f/u   Active Problems Problems  1. Bilateral kidney stones (592.0)   Assessed By: Hessie Diener (Urology); Last Assessed: 07 Jan 2010 2. Bladder neck contracture (596.0) 3. Erectile dysfunction due to arterial insufficiency (607.84)   Assessed By: Rolan Bucco (Urology); Last Assessed: 20 Jul 2011 4. Late effect of radiation (909.2)   Assessed By: Carolan Clines (Urology); Last Assessed: 21 Aug 2013 5. Nocturia (788.43)   Assessed By: Carolan Clines (Urology); Last Assessed: 21 Aug 2013 6. Overactive bladder (596.51)   Assessed By: Carolan Clines (Urology); Last Assessed: 21 Aug 2013 7. Personal history of prostate cancer (V10.46)   Assessed By: Carolan Clines (Urology); Last Assessed: 21 Aug 2013 8. Renal cyst, acquired (593.2)   Assessed By: Hessie Diener (Urology); Last Assessed: 24 Aug 2007  History of Present Illness     80 yo male (former patient of Drs. Peterson/Woodruff') returns today for a yearly f/u with a hx of:      #1 Nocturia   Duration: Chronic   Location: bladder   Quality: sudden   Severity: 3-4x/night-10-12   Associated symptoms:    Positive:    Negative: gross hematuria   Timing:   Modifying factors:     Exacerbated by: Soda & tea in the evenings.    Relieved by:   Context: AUA SS today is 12 w/ QoL 2.    #2. ED   Duration:  Chronic   Location: penis   Quality:   Severity: severe   Associated symptoms:    Positive: Peyronie's disease- curves to left.    Negative:   Timing:   Modifying factors:     Exacerbated by: CAD    Relieved by:   Context: This patient has coronary artery disease. He had hypotension with use of nitroglycerin. He states he tried fiber in the past that worked well. We discussed management options including urethral suppositories, other oral medications, penile injection, and vacuum-assisted device. We discussed the use of generic sildenafil and that this is off label use by the FDA. We discussed the risks, benefits, and side effects and what to do if he developed priapism.        #3. Urgency   Duration: chronic   Location: SP area of abdomen   Quality: sharp, sudden   Severity: mild.    Associated symptoms:    Positive:    Negative:   Timing: 2x/week   Modifying factors:     Exacerbated by:    Relieved by: Anitcholinergic meds have not helped in the past.   Context: Postvoid residual by ultrasound today was 0 indicating that he empties his bladder well.        #4. Prostate cancer   Duration: Since 2007   Location: Prostate   Quality:   Severity: Gleason 6   Associated symptoms:  Positive:    Negative: gross hematuria   Timing:   Modifying factors:     Exacerbated by:    Relieved by: Gleason 6.    Context: Last PSA on 08/12/12 was 0.54.   Past Medical History Problems  1. History of Atrial fibrillation (427.31) 2. History of diabetes mellitus (V12.29) 3. History of esophageal reflux (V12.79) 4. History of hypercholesterolemia (V12.29) 5. Personal history of prostate cancer (V10.46) 6. History of Skin Cancer (V10.83)  Surgical History Problems  1. History of Cataract Surgery 2. History of Colonoscopy (Fiberoptic) 3. History of Knee Replacement 4. History of Lung Surgery 5. History of Thyroid Surgery  Current Meds 1.  Aspirin 81 MG Oral Tablet Chewable;  Therapy: (Recorded:22Jan2008) to Recorded 2. BD Insulin Syr Ultrafine II 31G X 5/16" 1 ML Miscellaneous;  Therapy: 22Aug2011 to Recorded 3. Cartia XT 240 MG Oral Capsule Extended Release 24 Hour;  Therapy: (ZOXWRUEA:54UJW1191) to Recorded 4. Digoxin 250 MCG Oral Tablet;  Therapy: (Recorded:22Jan2008) to Recorded 5. Diltiazem HCl TABS;  Therapy: (Recorded:22Jan2008) to Recorded 6. Furosemide 20 MG Oral Tablet;  Therapy: 04Aug2011 to Recorded 7. HumuLIN R 100 UNIT/ML Injection Solution;  Therapy: (YNWGNFAO:13YQM5784) to Recorded 8. Lantus 100 UNIT/ML Subcutaneous Solution;  Therapy: (ONGEXBMW:41LKG4010) to Recorded 9. MiraLax Oral Powder;  Therapy: (UVOZDGUY:40HKV4259) to Recorded 10. Ocuvite TABS;   Therapy: (DGLOVFIE:33IRJ1884) to Recorded 11. Pantoprazole Sodium 40 MG Oral Tablet Delayed Release;   Therapy: 03Dec2011 to Recorded 12. Synthroid 50 MCG Oral Tablet;   Therapy: (ZYSAYTKZ:60FUX3235) to Recorded 13. Vitamin D CAPS;   Therapy: (TDDUKGUR:42HCW2376) to Recorded 14. Welchol 625 MG Oral Tablet;   Therapy: (EGBTDVVO:16WVP7106) to Recorded 15. Zolpidem Tartrate 5 MG Oral Tablet;   Therapy: [phone] to Recorded  Allergies Medication  1. Tetanus Toxoid Adsorbed SOLN 2. Codeine Derivatives 3. Flomax CP24 4. Ceftin TABS 5. Colchicine TABS 6. Erythromycin TABS 7. Nitrostat SUBL 8. Vantin TABS Non-Medication  9. Contrast Dye  Family History Problems  1. Family history of Acute Myocardial Infarction : Father 2. Family history of Death In The Family Father : Father   MIAge 2060/03/17. Family history of Death In The Family Mother : Mother   Age 79 4. Family history of Family Health Status Number Of Children   1 son and 1 daughter 5. Family history of Ischemic Stroke (V17.1) : Mother  Social History Problems  1. Denied: History of Alcohol Use 2. Caffeine Use   2 per day 3. Marital History - Currently Married 4. Never A  Smoker 5. Occupation:   retired Set designer  Review of Systems Genitourinary, constitutional, skin, eye, otolaryngeal, hematologic/lymphatic, cardiovascular, pulmonary, endocrine, musculoskeletal, gastrointestinal, neurological and psychiatric system(s) were reviewed and pertinent findings if present are noted.  Genitourinary: urinary frequency, feelings of urinary urgency, nocturia and incomplete emptying of bladder, but urine stream is not weak, urinary stream does not start and stop and initiating urination does not require straining.    Vitals Vital Signs [Data Includes: Last 1 Day]  Recorded: 10Aug2015 02:27PM  Height: 5 ft 11 in Weight: 161 lb  BMI Calculated: 22.46 BSA Calculated: 1.92 Blood Pressure: 152 / 89 Heart Rate: 72  Physical Exam Constitutional: Well nourished and well developed . No acute distress. The patient appears well hydrated.  ENT:. The ears and nose are normal in appearance.  Neck: The appearance of the neck is normal.  Pulmonary: No respiratory distress.  Cardiovascular:. No peripheral edema.  Abdomen: The abdomen is flat. No masses are palpated. No hernias are palpable.  Rectal: Rectal  exam demonstrates decreased sphincter tone. The prostate is smooth and flat. The prostate has no nodularity and is not indurated involving the entire right lobe  of the prostate which appears to be confined within the prostate capsule. The left seminal vesicle is not tender and without masses. The right seminal vesicle is without masses and not tender.  Genitourinary: Examination of the penis demonstrates no discharge, no masses, no lesions and a normal meatus. The penis is uncircumcised. The scrotum is without lesions. The right epididymis is palpably normal and non-tender. The left epididymis is palpably normal and non-tender. The right testis is non-tender and without masses. The left testis is non-tender and without masses.  Lymphatics: The femoral and inguinal nodes  are not enlarged or tender.  Skin: Normal skin turgor, no visible rash and no visible skin lesions.  Neuro/Psych:. Mood and affect are appropriate.    Results/Data Urine [Data Includes: Last 1 Day]   [id number]  COLOR YELLOW   APPEARANCE CLEAR   SPECIFIC GRAVITY 1.020   pH 5.5   GLUCOSE NEG mg/dL  BILIRUBIN NEG   KETONE NEG mg/dL  BLOOD NEG   PROTEIN NEG mg/dL  UROBILINOGEN 0.2 mg/dL  NITRITE NEG   LEUKOCYTE ESTERASE NEG    Assessment Assessed  1. Nocturia (788.43) 2. Urge incontinence of urine (788.31) 3. Personal history of prostate cancer (V10.46) 4. 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 Overactive bladder (596.51)   80 yo male with hx of G-6 CaP, post EBRT in 2007, with post op frequency, and nocturia, and radiation cystitis/prostatitis, failing Enablex and [place], and now failing   PTNS 1  in 2011. He is c/o severe nocturia, and some daytime incontinence . No evidence of infection.    We have discussed the options that he has worked thru-Level I-II and Level II-III treatments, and believe that He is now ready for Botox injection in his bladder. We have discussed that I would start with a low "average" dose for OAB,200 units, although this may require a higher dose in the future.   1 Amended By: Carolan Clines; Sep 11 2013 8:44 AM EST  Plan Health Maintenance  1. UA With REFLEX; [Do Not Release]; Status:Resulted - Requires  Verification;   Done: [id number] 02:05PM Late effect of radiation, Overactive bladder, PMH: Personal history of prostate cancer, Urge incontinence of urine  2. Follow-up Schedule Surgery Office  Follow-up  Status: Hold For -  Appointment  Requested for: [phone]  Cysto, and bladder Botox injection.   Signatures Electronically signed by : Carolan Clines, M.D.; Aug 21 2013  2:57PM EST

## 2013-09-11 NOTE — ED Notes (Signed)
Pt had botox to his bladder this morning by Dr Gaynelle Arabian  Pt states since he has been home he has been unable to control his urine  Pt states he has wet on himself twice and he does not even realize it was happening   Denies pain during urination but has pain after he goes

## 2013-09-11 NOTE — Interval H&P Note (Signed)
History and Physical Interval Note:  09/11/2013 8:48 AM  [name]  has presented today for surgery, with the diagnosis of urge incontinence  The various methods of treatment have been discussed with the patient and family. After consideration of risks, benefits and other options for treatment, the patient has consented to  Procedure(s): CYSTOSCOPY WITH BOTOX INJECTION INTO THE BLADDER (N/A) as a surgical intervention .  The patient's history has been reviewed, patient examined, no change in status, stable for surgery.  I have reviewed the patient's chart and labs.  Questions were answered to the patient's satisfaction.     Carolan Clines I

## 2013-09-11 NOTE — Anesthesia Preprocedure Evaluation (Addendum)
Anesthesia Evaluation  Patient identified by MRN, date of birth, ID band Patient awake    Reviewed: Allergy & Precautions, H&P , NPO status , Patient's Chart, lab work & pertinent test results  Airway Mallampati: II TM Distance: >3 FB Neck ROM: Full  Mouth opening: Limited Mouth Opening Comment: C/o jaw or sinus pain. He has recently been put on an antibiotic for presumptive sinus infection. Dental no notable dental hx.    Pulmonary asthma ,  breath sounds clear to auscultation  Pulmonary exam normal       Cardiovascular + dysrhythmias Atrial Fibrillation Rhythm:Regular Rate:Normal  ECG: NSR. LVH, NS STA   Neuro/Psych negative neurological ROS  negative psych ROS   GI/Hepatic Neg liver ROS, GERD-  Medicated,  Endo/Other  diabetes, Type 2, Insulin DependentHypothyroidism   Renal/GU negative Renal ROS  negative genitourinary   Musculoskeletal negative musculoskeletal ROS (+)   Abdominal   Peds negative pediatric ROS (+)  Hematology negative hematology ROS (+)   Anesthesia Other Findings   Reproductive/Obstetrics negative OB ROS                          Anesthesia Physical Anesthesia Plan  ASA: III  Anesthesia Plan: General   Post-op Pain Management:    Induction: Intravenous  Airway Management Planned: LMA  Additional Equipment:   Intra-op Plan:   Post-operative Plan: Extubation in OR  Informed Consent: I have reviewed the patients History and Physical, chart, labs and discussed the procedure including the risks, benefits and alternatives for the proposed anesthesia with the patient or authorized representative who has indicated his/her understanding and acceptance.   Dental advisory given  Plan Discussed with: CRNA  Anesthesia Plan Comments:         Anesthesia Quick Evaluation

## 2013-09-11 NOTE — Transfer of Care (Signed)
Immediate Anesthesia Transfer of Care Note  Patient: [name]  Procedure(s) Performed: Procedure(s): CYSTOSCOPY WITH BOTOX INJECTION INTO THE BLADDER (N/A)  Patient Location: PACU  Anesthesia Type:General  Level of Consciousness: awake, alert  and oriented  Airway & Oxygen Therapy: Patient Spontanous Breathing and Patient connected to face mask oxygen  Post-op Assessment: Report given to PACU RN and Post -op Vital signs reviewed and stable  Post vital signs: Reviewed and stable  Complications: No apparent anesthesia complications

## 2013-09-11 NOTE — Discharge Instructions (Signed)

## 2013-09-11 NOTE — Progress Notes (Signed)
Accidentally charted that IV was removed by Me  This site was not an active IV, cannot correct this in  EPIC

## 2013-09-12 ENCOUNTER — Encounter (HOSPITAL_COMMUNITY): Payer: Self-pay | Admitting: Urology

## 2013-09-12 DIAGNOSIS — M26629 Arthralgia of temporomandibular joint, unspecified side: Secondary | ICD-10-CM | POA: Diagnosis not present

## 2013-09-12 DIAGNOSIS — N3941 Urge incontinence: Secondary | ICD-10-CM | POA: Diagnosis not present

## 2013-09-12 DIAGNOSIS — R351 Nocturia: Secondary | ICD-10-CM | POA: Diagnosis not present

## 2013-09-12 DIAGNOSIS — N318 Other neuromuscular dysfunction of bladder: Secondary | ICD-10-CM | POA: Diagnosis not present

## 2013-09-12 DIAGNOSIS — I10 Essential (primary) hypertension: Secondary | ICD-10-CM | POA: Diagnosis not present

## 2013-09-19 DIAGNOSIS — E1139 Type 2 diabetes mellitus with other diabetic ophthalmic complication: Secondary | ICD-10-CM | POA: Diagnosis not present

## 2013-09-19 DIAGNOSIS — H35359 Cystoid macular degeneration, unspecified eye: Secondary | ICD-10-CM | POA: Diagnosis not present

## 2013-09-19 DIAGNOSIS — E11329 Type 2 diabetes mellitus with mild nonproliferative diabetic retinopathy without macular edema: Secondary | ICD-10-CM | POA: Diagnosis not present

## 2013-09-23 ENCOUNTER — Emergency Department (HOSPITAL_COMMUNITY): Payer: Medicare Other

## 2013-09-23 ENCOUNTER — Encounter (HOSPITAL_COMMUNITY): Payer: Self-pay | Admitting: Emergency Medicine

## 2013-09-23 ENCOUNTER — Emergency Department (HOSPITAL_COMMUNITY)
Admission: EM | Admit: 2013-09-23 | Discharge: 2013-09-23 | Disposition: A | Discharge status: Home / Self Care | Payer: Medicare Other | Attending: Emergency Medicine | Admitting: Emergency Medicine

## 2013-09-23 DIAGNOSIS — R221 Localized swelling, mass and lump, neck: Secondary | ICD-10-CM | POA: Diagnosis not present

## 2013-09-23 DIAGNOSIS — M129 Arthropathy, unspecified: Secondary | ICD-10-CM | POA: Diagnosis not present

## 2013-09-23 DIAGNOSIS — Z7982 Long term (current) use of aspirin: Secondary | ICD-10-CM | POA: Insufficient documentation

## 2013-09-23 DIAGNOSIS — Z79899 Other long term (current) drug therapy: Secondary | ICD-10-CM | POA: Diagnosis not present

## 2013-09-23 DIAGNOSIS — E039 Hypothyroidism, unspecified: Secondary | ICD-10-CM | POA: Insufficient documentation

## 2013-09-23 DIAGNOSIS — Z923 Personal history of irradiation: Secondary | ICD-10-CM | POA: Diagnosis not present

## 2013-09-23 DIAGNOSIS — Z87442 Personal history of urinary calculi: Secondary | ICD-10-CM | POA: Insufficient documentation

## 2013-09-23 DIAGNOSIS — K219 Gastro-esophageal reflux disease without esophagitis: Secondary | ICD-10-CM | POA: Diagnosis not present

## 2013-09-23 DIAGNOSIS — Z8546 Personal history of malignant neoplasm of prostate: Secondary | ICD-10-CM | POA: Insufficient documentation

## 2013-09-23 DIAGNOSIS — R609 Edema, unspecified: Secondary | ICD-10-CM | POA: Diagnosis not present

## 2013-09-23 DIAGNOSIS — Z87448 Personal history of other diseases of urinary system: Secondary | ICD-10-CM | POA: Insufficient documentation

## 2013-09-23 DIAGNOSIS — E785 Hyperlipidemia, unspecified: Secondary | ICD-10-CM | POA: Diagnosis not present

## 2013-09-23 DIAGNOSIS — Z872 Personal history of diseases of the skin and subcutaneous tissue: Secondary | ICD-10-CM | POA: Insufficient documentation

## 2013-09-23 DIAGNOSIS — M7989 Other specified soft tissue disorders: Secondary | ICD-10-CM | POA: Insufficient documentation

## 2013-09-23 DIAGNOSIS — J45909 Unspecified asthma, uncomplicated: Secondary | ICD-10-CM | POA: Insufficient documentation

## 2013-09-23 DIAGNOSIS — M79609 Pain in unspecified limb: Secondary | ICD-10-CM | POA: Diagnosis not present

## 2013-09-23 DIAGNOSIS — Z8669 Personal history of other diseases of the nervous system and sense organs: Secondary | ICD-10-CM | POA: Diagnosis not present

## 2013-09-23 DIAGNOSIS — R220 Localized swelling, mass and lump, head: Secondary | ICD-10-CM | POA: Insufficient documentation

## 2013-09-23 DIAGNOSIS — Z85828 Personal history of other malignant neoplasm of skin: Secondary | ICD-10-CM | POA: Insufficient documentation

## 2013-09-23 DIAGNOSIS — E119 Type 2 diabetes mellitus without complications: Secondary | ICD-10-CM | POA: Diagnosis not present

## 2013-09-23 DIAGNOSIS — R600 Localized edema: Secondary | ICD-10-CM

## 2013-09-23 DIAGNOSIS — Z791 Long term (current) use of non-steroidal anti-inflammatories (NSAID): Secondary | ICD-10-CM | POA: Diagnosis not present

## 2013-09-23 DIAGNOSIS — Z794 Long term (current) use of insulin: Secondary | ICD-10-CM | POA: Insufficient documentation

## 2013-09-23 LAB — BASIC METABOLIC PANEL
Anion gap: 13 (ref 5–15)
BUN: 15 mg/dL (ref 6–23)
CO2: 25 mEq/L (ref 19–32)
Calcium: 8.9 mg/dL (ref 8.4–10.5)
Chloride: 102 mEq/L (ref 96–112)
Creatinine, Ser: 0.84 mg/dL (ref 0.50–1.35)
GFR calc Af Amer: >90 mL/min (ref >90)
GFR calc non Af Amer: 81 mL/min — ABNORMAL LOW (ref >90)
Glucose, Bld: 200 mg/dL — ABNORMAL HIGH (ref 70–99)
Potassium: 4.5 mEq/L (ref 3.7–5.3)
Sodium: 140 mEq/L (ref 137–147)

## 2013-09-23 LAB — URINALYSIS, ROUTINE W REFLEX MICROSCOPIC
Bilirubin Urine: NEGATIVE
Glucose, UA: 100 mg/dL — AB
Hgb urine dipstick: NEGATIVE
Ketones, ur: NEGATIVE mg/dL
Leukocytes, UA: NEGATIVE
Nitrite: NEGATIVE
Protein, ur: NEGATIVE mg/dL
Specific Gravity, Urine: 1.012 (ref 1.005–1.030)
Urobilinogen, UA: 0.2 mg/dL (ref 0.0–1.0)
pH: 5.5 (ref 5.0–8.0)

## 2013-09-23 LAB — CBG MONITORING, ED: Glucose-Capillary: 228 mg/dL — ABNORMAL HIGH (ref 70–99)

## 2013-09-23 MED ORDER — HYDROCODONE-ACETAMINOPHEN 5-325 MG PO TABS
1.0000 | ORAL_TABLET | Freq: Once | ORAL | Status: AC
  Administered 2013-09-23: 1 via ORAL
  Filled 2013-09-23: qty 1

## 2013-09-23 MED ORDER — FUROSEMIDE 40 MG PO TABS
40.0000 mg | ORAL_TABLET | Freq: Once | ORAL | Status: AC
  Administered 2013-09-23: 40 mg via ORAL
  Filled 2013-09-23: qty 1

## 2013-09-23 NOTE — Discharge Instructions (Signed)
Peripheral Edema You have swelling in your legs (peripheral edema). This swelling is due to excess accumulation of salt and water in your body. Edema may be a sign of heart, kidney or liver disease, or a side effect of a medication. It may also be due to problems in the leg veins. Elevating your legs and using special support stockings may be very helpful, if the cause of the swelling is due to poor venous circulation. Avoid long periods of standing, whatever the cause. Treatment of edema depends on identifying the cause. Chips, pretzels, pickles and other salty foods should be avoided. Restricting salt in your diet is almost always needed. Water pills (diuretics) are often used to remove the excess salt and water from your body via urine. These medicines prevent the kidney from reabsorbing sodium. This increases urine flow. Diuretic treatment may also result in lowering of potassium levels in your body. Potassium supplements may be needed if you have to use diuretics daily. Daily weights can help you keep track of your progress in clearing your edema. You should call your caregiver for follow up care as recommended. SEEK IMMEDIATE MEDICAL CARE IF:   You have increased swelling, pain, redness, or heat in your legs.  You develop shortness of breath, especially when lying down.  You develop chest or abdominal pain, weakness, or fainting.  You have a fever. Document Released: 02/06/2004 Document Revised: 03/23/2011 Document Reviewed: 01/16/2009 [facility] Patient Information 2015 [place], [place]. This information is not intended to replace advice given to you by your health care provider. Make sure you discuss any questions you have with your health care provider.  Peripheral Edema You have swelling in your legs (peripheral edema). This swelling is due to excess accumulation of salt and water in your body. Edema may be a sign of heart, kidney or liver disease, or a side effect of a medication. It may  also be due to problems in the leg veins. Elevating your legs and using special support stockings may be very helpful, if the cause of the swelling is due to poor venous circulation. Avoid long periods of standing, whatever the cause. Treatment of edema depends on identifying the cause. Chips, pretzels, pickles and other salty foods should be avoided. Restricting salt in your diet is almost always needed. Water pills (diuretics) are often used to remove the excess salt and water from your body via urine. These medicines prevent the kidney from reabsorbing sodium. This increases urine flow. Diuretic treatment may also result in lowering of potassium levels in your body. Potassium supplements may be needed if you have to use diuretics daily. Daily weights can help you keep track of your progress in clearing your edema. You should call your caregiver for follow up care as recommended. SEEK IMMEDIATE MEDICAL CARE IF:   You have increased swelling, pain, redness, or heat in your legs.  You develop shortness of breath, especially when lying down.  You develop chest or abdominal pain, weakness, or fainting.  You have a fever. Document Released: 02/06/2004 Document Revised: 03/23/2011 Document Reviewed: 01/16/2009 [facility] Patient Information 2015 [place], [place]. This information is not intended to replace advice given to you by your health care provider. Make sure you discuss any questions you have with your health care provider.

## 2013-09-23 NOTE — ED Notes (Signed)
Pt presents after being seen at [facility]. Pt has bilateral lower leg swelling and jaw swelling, which began last night. Pt was referred to ED to R/O CHF. Pt denies chest pain and shortness of breath.

## 2013-10-03 DIAGNOSIS — N183 Chronic kidney disease, stage 3 unspecified: Secondary | ICD-10-CM | POA: Diagnosis not present

## 2013-10-03 DIAGNOSIS — E1129 Type 2 diabetes mellitus with other diabetic kidney complication: Secondary | ICD-10-CM | POA: Diagnosis not present

## 2013-10-03 DIAGNOSIS — Z23 Encounter for immunization: Secondary | ICD-10-CM | POA: Diagnosis not present

## 2013-10-03 DIAGNOSIS — I129 Hypertensive chronic kidney disease with stage 1 through stage 4 chronic kidney disease, or unspecified chronic kidney disease: Secondary | ICD-10-CM | POA: Diagnosis not present

## 2013-10-03 DIAGNOSIS — R609 Edema, unspecified: Secondary | ICD-10-CM | POA: Diagnosis not present

## 2013-10-03 NOTE — ED Provider Notes (Signed)
CSN: [id number]     Arrival date & time 09/23/13  1250 History   First MD Initiated Contact with Patient 09/23/13 1351     Chief Complaint  Patient presents with  . Leg Swelling  . Facial Swelling     (Consider location/radiation/quality/duration/timing/severity/associated sxs/prior Treatment) HPI  80 year old male with lower extremity swelling. First noticed yesterday. Seems worse today. Denies any acute pain. No shortness of breath. Denies trauma. No rash. No acute urinary complaints. Seen at urgent care before arrival and referred to ED.   Past Medical History  Diagnosis Date  . History of skin cancer   . Hypothyroidism   . Asthmatic bronchitis   . Allergic rhinitis   . Urticaria   . Dyslipidemia   . DM type 2 (diabetes mellitus, type 2)   . Macular degeneration     injections done bilaterally recent - 08-22-13  . Dysrhythmia     '97 tachycardia- no recent issues  . Arthritis     arthritis-kyphosis  . GERD (gastroesophageal reflux disease)   . 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 Urgency-frequency syndrome   . History of kidney stones   . Hearing impaired     bilateral hearing aids   Past Surgical History  Procedure Laterality Date  . Popliteal synovial cyst excision  2005  . Gallbladder surgery  2006    no cholecystectomy  . Total knee arthroplasty  2008    right  . Total knee arthroplasty  2010    left  . Partial thymectomy  1985    for nodules  . Lung removal, partial  age 36    for bronchiectasis  . Cataract surgery Bilateral   . Colonoscopy w/ polypectomy      x2   . Cystoscopy with retrograde pyelogram, ureteroscopy and stent placement    . Back surgery      lumbar fracture  . Eye surgery      macular degeneration  . Hernia repair    . Cystoscopy with injection N/A 09/11/2013    Procedure: CYSTOSCOPY WITH BOTOX INJECTION INTO THE BLADDER;  Surgeon: Ailene Rud, MD;   Location: [facility];  Service: Urology;  Laterality: N/A;   Family History  Problem Relation Age of Onset  . Other Sister     ophth disease  . Heart attack Father     x7  . Heart disease Father     MI x7  . Hypertension Mother   . Stroke Mother 72   History  Substance Use Topics  . Smoking status: Never Smoker   . Smokeless tobacco: Never Used  . Alcohol Use: No    Review of Systems  All systems reviewed and negative, other than as noted in HPI.   Allergies  Colchicine; Atorvastatin; Ceftin; Codeine; Erythromycin; Ezetimibe; Hibiclens; Nitroglycerin; Oxycodone-acetaminophen; Rosuvastatin; Simvastatin; Vantin; Contrast media; and Tetanus toxoid  Home Medications   Prior to Admission medications   Medication Sig Start Date End Date Taking? Authorizing Provider  acetaminophen (TYLENOL) 500 MG tablet Take 1,000 mg by mouth every 6 (six) hours as needed for mild pain or moderate pain.   Yes Historical Provider, MD  aspirin 81 MG tablet Take 81 mg by mouth daily.     Yes Historical Provider, MD  celecoxib (CELEBREX) 200 MG capsule Take 200 mg by mouth daily.   Yes Historical Provider, MD  Cholecalciferol (VITAMIN D) 2000 UNITS tablet Take 2,000 Units by mouth  daily.   Yes Historical Provider, MD  colesevelam (WELCHOL) 625 MG tablet Take 1,250 mg by mouth 3 (three) times daily.   Yes Historical Provider, MD  digoxin (LANOXIN) 0.25 MG tablet Take 0.25 mg by mouth every evening.   Yes Historical Provider, MD  diltiazem (CARDIZEM CD) 240 MG 24 hr capsule Take 240 mg by mouth every evening.   Yes Historical Provider, MD  furosemide (LASIX) 20 MG tablet Take 20 mg by mouth every morning.    Yes Historical Provider, MD  insulin glargine (LANTUS) 100 UNIT/ML injection Inject 10-20 Units into the skin at bedtime.    Yes Historical Provider, MD  insulin regular (NOVOLIN R,HUMULIN R) 100 units/mL injection Inject 5-7 Units into the skin 3 (three) times daily before meals.   Yes Historical  Provider, MD  levothyroxine (SYNTHROID) 200 MCG tablet Take 200 mcg by mouth daily before breakfast. Take with 75 mcg tablet   Yes Historical Provider, MD  levothyroxine (SYNTHROID, LEVOTHROID) 75 MCG tablet Take 75 mcg by mouth daily before breakfast. Take with 200 mcg tablet   Yes Historical Provider, MD  Multiple Vitamins-Minerals (OCUVITE EXTRA PO) Take 1 tablet by mouth 2 (two) times daily.    Yes Historical Provider, MD  omeprazole (PRILOSEC) 20 MG capsule Take 20 mg by mouth daily.   Yes Historical Provider, MD  polyethylene glycol (MIRALAX / GLYCOLAX) packet Take 17 g by mouth daily.   Yes Historical Provider, MD  zolpidem (AMBIEN) 5 MG tablet Take 5 mg by mouth at bedtime as needed for sleep.   Yes Historical Provider, MD   BP 178/86  Pulse 68  Temp(Src) 97.8 F (36.6 C) (Oral)  Resp 14  SpO2 97% Physical Exam  Nursing note and vitals reviewed. Constitutional: He appears well-developed and well-nourished. No distress.  HENT:  Head: Normocephalic and atraumatic.  Mouth/Throat: Oropharynx is clear and moist.  Eyes: Conjunctivae are normal. Right eye exhibits no discharge. Left eye exhibits no discharge.  Neck: Neck supple.  Cardiovascular: Normal rate, regular rhythm and normal heart sounds.  Exam reveals no gallop and no friction rub.   No murmur heard. Pulmonary/Chest: Effort normal and breath sounds normal. No respiratory distress.  Abdominal: Soft. He exhibits no distension. There is no tenderness.  Musculoskeletal: He exhibits edema. He exhibits no tenderness.  Neurological: He is alert.  Skin: Skin is warm and dry.  Psychiatric: He has a normal mood and affect. His behavior is normal. Thought content normal.    ED Course  Procedures (including critical care time) Labs Review Labs Reviewed  BASIC METABOLIC PANEL - Abnormal; Notable for the following:    Glucose, Bld 200 (*)    GFR calc non Af Amer 81 (*)    All other components within normal limits  URINALYSIS,  ROUTINE W REFLEX MICROSCOPIC - Abnormal; Notable for the following:    Glucose, UA 100 (*)    All other components within normal limits  CBG MONITORING, ED - Abnormal; Notable for the following:    Glucose-Capillary 228 (*)    All other components within normal limits    Imaging Review No results found.   EKG Interpretation None      MDM   Final diagnoses:  Edema of lower extremity, unspecified laterality    81yM with LE edema. Not concerning for cellulitis or DVT. No respiratory complaints. I cannot appreciate any facial swelling. Plan course lasix.     Virgel Manifold, MD 10/03/13 [phone]

## 2013-10-17 ENCOUNTER — Telehealth: Payer: Self-pay | Admitting: Internal Medicine

## 2013-10-17 NOTE — Telephone Encounter (Signed)
Called and spoke with pts wife and she is aware of CY recs.  Nothing further is needed.

## 2013-10-17 NOTE — Telephone Encounter (Signed)
Pt has upcoming appt with [facility] on 10.19.15 for yearly follow up (last seen 4.19.15 by [facility])  Called spoke with spouse who reports runny nose (clear drainage) sneezing, prod cough with white mucus x2-3 weeks.   Denies any increased dyspnea, wheezing, tightness, PND, leg swelling, f/c/s, n/v/d, hemoptysis  Pt/spouse is requesting a sooner appt or recs over the phone please.  Dr Annamaria Boots please advise, thank you. [facility] [address] Allergies  Allergen Reactions  . Colchicine Anaphylaxis  . Atorvastatin Other (See Comments)    Joint soreness  . Ceftin Diarrhea  . Codeine Hives  . Erythromycin Diarrhea  . Ezetimibe Other (See Comments)    Joint soreness  . Hibiclens [Chlorhexidine] Hives    Cannot use must use ivory soap  . Nitroglycerin Other (See Comments)    lowers blood pressure   . Oxycodone-Acetaminophen Hives  . Rosuvastatin Other (See Comments)    Joint soreness  . Simvastatin Other (See Comments)    Joint soreness  . Vantin Other (See Comments)    Doesn't remember  . Contrast Media [Iodinated Diagnostic Agents] Nausea And Vomiting and Rash  . Tetanus Toxoid Rash   Current Outpatient Prescriptions on File Prior to Visit  Medication Sig Dispense Refill  . acetaminophen (TYLENOL) 500 MG tablet Take 1,000 mg by mouth every 6 (six) hours as needed for mild pain or moderate pain.      Marland Kitchen aspirin 81 MG tablet Take 81 mg by mouth daily.        . celecoxib (CELEBREX) 200 MG capsule Take 200 mg by mouth daily.      . Cholecalciferol (VITAMIN D) 2000 UNITS tablet Take 2,000 Units by mouth daily.      . colesevelam (WELCHOL) 625 MG tablet Take 1,250 mg by mouth 3 (three) times daily.      . digoxin (LANOXIN) 0.25 MG tablet Take 0.25 mg by mouth every evening.      . diltiazem (CARDIZEM CD) 240 MG 24 hr capsule Take 240 mg by mouth every evening.      . furosemide (LASIX) 20 MG tablet Take 20 mg by mouth every morning.       . insulin glargine (LANTUS) 100 UNIT/ML injection Inject 10-20 Units  into the skin at bedtime.       . insulin regular (NOVOLIN R,HUMULIN R) 100 units/mL injection Inject 5-7 Units into the skin 3 (three) times daily before meals.      Marland Kitchen levothyroxine (SYNTHROID) 200 MCG tablet Take 200 mcg by mouth daily before breakfast. Take with 75 mcg tablet      . levothyroxine (SYNTHROID, LEVOTHROID) 75 MCG tablet Take 75 mcg by mouth daily before breakfast. Take with 200 mcg tablet      . Multiple Vitamins-Minerals (OCUVITE EXTRA PO) Take 1 tablet by mouth 2 (two) times daily.       Marland Kitchen omeprazole (PRILOSEC) 20 MG capsule Take 20 mg by mouth daily.      . polyethylene glycol (MIRALAX / GLYCOLAX) packet Take 17 g by mouth daily.      Marland Kitchen zolpidem (AMBIEN) 5 MG tablet Take 5 mg by mouth at bedtime as needed for sleep.       No current facility-administered medications on file prior to visit.

## 2013-10-17 NOTE — Telephone Encounter (Signed)
Suggest daily antihistamine like claritin or allegra, and daily use of flonase nasal spray (otc) 1-2 sprays each nostril once daily at bedtime

## 2013-10-18 DIAGNOSIS — R351 Nocturia: Secondary | ICD-10-CM | POA: Diagnosis not present

## 2013-10-18 DIAGNOSIS — N3281 Overactive bladder: Secondary | ICD-10-CM | POA: Diagnosis not present

## 2013-10-20 DIAGNOSIS — E039 Hypothyroidism, unspecified: Secondary | ICD-10-CM | POA: Diagnosis not present

## 2013-10-30 ENCOUNTER — Ambulatory Visit: Payer: Medicare Other | Admitting: Internal Medicine

## 2013-10-31 DIAGNOSIS — H43811 Vitreous degeneration, right eye: Secondary | ICD-10-CM | POA: Diagnosis not present

## 2013-10-31 DIAGNOSIS — E11329 Type 2 diabetes mellitus with mild nonproliferative diabetic retinopathy without macular edema: Secondary | ICD-10-CM | POA: Diagnosis not present

## 2013-10-31 DIAGNOSIS — H35352 Cystoid macular degeneration, left eye: Secondary | ICD-10-CM | POA: Diagnosis not present

## 2013-11-10 DIAGNOSIS — J40 Bronchitis, not specified as acute or chronic: Secondary | ICD-10-CM | POA: Diagnosis not present

## 2013-11-10 DIAGNOSIS — I129 Hypertensive chronic kidney disease with stage 1 through stage 4 chronic kidney disease, or unspecified chronic kidney disease: Secondary | ICD-10-CM | POA: Diagnosis not present

## 2013-11-10 DIAGNOSIS — N183 Chronic kidney disease, stage 3 (moderate): Secondary | ICD-10-CM | POA: Diagnosis not present

## 2013-12-12 DIAGNOSIS — H43811 Vitreous degeneration, right eye: Secondary | ICD-10-CM | POA: Diagnosis not present

## 2013-12-12 DIAGNOSIS — H3532 Exudative age-related macular degeneration: Secondary | ICD-10-CM | POA: Diagnosis not present

## 2013-12-12 DIAGNOSIS — H029 Unspecified disorder of eyelid: Secondary | ICD-10-CM | POA: Diagnosis not present

## 2013-12-22 ENCOUNTER — Encounter: Payer: Self-pay | Admitting: Internal Medicine

## 2013-12-22 ENCOUNTER — Ambulatory Visit (INDEPENDENT_AMBULATORY_CARE_PROVIDER_SITE_OTHER): Payer: Medicare Other | Admitting: Internal Medicine

## 2013-12-22 VITALS — BP 122/70 | HR 73 | Ht 69.0 in | Wt 163.2 lb

## 2013-12-22 DIAGNOSIS — J310 Chronic rhinitis: Secondary | ICD-10-CM

## 2013-12-22 MED ORDER — IPRATROPIUM BROMIDE 0.03 % NA SOLN: 2.0000 | Freq: Two times a day (BID) | NASAL | Status: DC

## 2013-12-22 MED ORDER — ZOLPIDEM TARTRATE 5 MG PO TABS: 5.0000 mg | ORAL_TABLET | Freq: Every evening | ORAL | Status: DC | PRN

## 2013-12-22 NOTE — Assessment & Plan Note (Signed)
He has history of allergic rhinitis but now in early winter and with described sensitivity to cold as a stimulus, this is probably vasomotor rhinitis. Plan-try ipratropium nasal spray

## 2013-12-22 NOTE — Patient Instructions (Signed)
Script refilling [employer] to try ipratropium (Atrovent) nasal spray- 1 or 2 puffs each nostril every 8 hours, if needed for drainage.  Please call if needed

## 2013-12-22 NOTE — Progress Notes (Signed)
Patient ID: [name], male    DOB: Jan 17, 1931, 80 y.o.   MRN: [id number]  HPI Last here Decmber 6, 2011. Wive here now. He is going to [place] for second opinion about ? Parkinsons.  He notices that he coughs more, with some sputum, if he lies on right side. No recent antibiotics.  Increased watery nose with Spring pollen.  CXR- 12/25/09- clear, NAD.  Asks refill generic ambien for occasional nonspecific insomnia.   11/17/10- 78 yoM never smoker followed for recurrent bronchitis, complicated by hx urticaria, PAFIB, DM. Wife here Has had flu vaccine. He had a recent upper respiratory infection with watery rhinorrhea headache and malaise because of which she spent a few days in the house and in bed. He took Benadryl and Tylenol. That illness is largely resolved. He still has nasal congestion and some cough producing clear mucus. He has not been needing his rescue inhaler. He asks for a Depo-Medrol injection and we discussed his blood sugar status. He likes benzonatate for cough.  01/14/11- 78 yoM never smoker followed for recurrent bronchitis, complicated by hx urticaria, PAFIB, DM, Chronic insomnia.   Wife here Acute visit-cough; productive-white and thick, sinus drainage, wheezing as well-worse at night Has had flu vaccine. Went to [facility] emergency room on Christmas Eve with malaise. He was treated with continuous nebulizer, intravenous steroids and prednisone taper. The next day he had some fever which is resolved. He complains of persistent postnasal drip, dry cough and malaise. With hard coughing he has had some sharp left lateral chest wall pain. Sleeping sitting up. His raspy breathing noise worries him. He is taking Delsym, Mucinex D. and says antibiotics have not helped. Still on prednisone. For his problem of chronic insomnia-complains he can't sleep. Waiting for prior authorization to renew his Ambien and can't sleep without it.  03/02/11-  78 yoM never smoker followed for  recurrent bronchitis, complicated by hx urticaria, PAFIB, DM, Chronic insomnia.  C/O cough getting worse-night time; unable to sleep; ENT 3 weeks ago-was told to use salt water mixture in nose. He has been coughing for 6 weeks. His ENT doctor told him to take Prilosec twice daily and that did seem to help initially. In the last 2 weeks cough is worse again, productive of scant white sputum. Taking benzonatate. Much rhinorrhea. Denies any reflux or choking with food but then admits he swallows bread after pills to make them go down.  05/30/12-  80 yoM never smoker followed for recurrent bronchitis, complicated by hx urticaria, PAFIB, DM, Chronic insomnia.  Wife here FOLLOWS FOR: sinus drainage and slight cough at times; Denies any wheezing or  SOB. Scheduled f/u without acute concerns. No urticaria. Some pndrip and throat clearing. Little cough and no wheeze. No concerns about meds or pollen season.  04/24/13- 80 yoM never smoker followed for recurrent bronchitis, complicated by hx urticaria, PAFIB, DM, Chronic insomnia.  Wife here FOLLOWS FOR: acute visit. C/o increase in cough and unable to produce mucus with cough. C/o chest soreness from coughing  and SOB with activity.  2-3 weeks much dry cough and wheeze when lying down. Rescue inhaler not enough help. Postnasal drip. Prednisone +/- help.  12/22/13- 80 yoM never smoker followed for recurrent bronchitis, complicated by hx urticaria, PAFIB, DM, Chronic insomnia.  Wife here FOLLOWS FOR: coughs up thick/clear phelgm after eating; runny nose as well. Pt states his breathing is doing fine.   Dymista sample was not enough help. Nasal congestion and watery nose  are triggered by exposure to cold such as cold drink or food. He asks refill Ambien for his chronic insomnia. He has been tolerating this well without confusion or recognized problems. We discussed safety again.  Review of Systems-see HPI Constitutional:   No-   weight loss, night sweats,  fevers, chills, fatigue, lassitude. HEENT:   No-  headaches, difficulty swallowing, tooth/dental problems, sore throat,       No-  sneezing, itching, ear ache, +nasal congestion, +post nasal drip,  CV:  No-   chest pain, orthopnea, PND, swelling in lower extremities, anasarca, or dizziness, palpitations Resp: +   shortness of breath with exertion or at rest.              No- productive cough,  No non-productive cough,  No- coughing up of blood.              No-   change in color of mucus.  No- wheezing.   Skin: No-   rash or lesions. GI:  No-   heartburn, indigestion, abdominal pain, nausea, vomiting, GU:  MS:  No-   joint pain or swelling.    . Neuro-     nothing unusual Psych:  No- change in mood or affect. No depression or anxiety.  No memory loss.  Objective:   Physical Exam General- Alert, Oriented, Affect-appropriate, Distress- none acute Skin- rash-none, lesions- none, excoriation- none Lymphadenopathy- none Head- atraumatic            Eyes- Gross vision intact, PERRLA, conjunctivae clear secretions            Ears- Hearing aid, HOH            Nose- pale and watery, no-Septal dev, mucus, polyps, erosion, perforation             Throat- Mallampati II-III  , mucous+ clear , drainage- none, tonsils- atrophic Neck- flexible , trachea midline, no stridor , thyroid nl, carotid no bruit Chest - symmetrical excursion , unlabored           Heart/CV- RRR , no murmur , no gallop  , no rub, nl s1 s2                           - JVD- none , edema- none, stasis changes- none, varices- none           Lung- clear, cough+none , dullness-none, rub- none           Chest wall- Abd- Br/ Gen/ Rectal- Not done, not indicated Extrem- cyanosis- none, clubbing, none, atrophy- none, strength- nl Neuro- grossly intact to observation, hesitant speech

## 2013-12-25 ENCOUNTER — Telehealth: Payer: Self-pay | Admitting: Internal Medicine

## 2013-12-25 NOTE — Telephone Encounter (Addendum)
I called the pharmacy and asked for the PA number which is [phone] Pt ID# [id number]. Ambien has been approved through 12-25-14. I ATC pt to advise, NA, voicemail never clicked over. Need to advise med is approved and they can pick-up med at pharmacy.

## 2013-12-26 NOTE — Telephone Encounter (Signed)
Called and spoke to pt's wife. Informed pt's wife of the approval of ambien. Pt's wife verbalized understanding and denied any further questions or concerns at this time.

## 2014-01-15 ENCOUNTER — Telehealth: Payer: Self-pay | Admitting: Internal Medicine

## 2014-01-15 NOTE — Telephone Encounter (Signed)
Spoke with pt and he reports that his wife called in originally but he is using Atrovent nasal spray that was given to him at last ov and he thinks he will be ok.  Advised pt to call our office back if symptoms continue.  Pt verbalized understanding.

## 2014-01-16 DIAGNOSIS — H35053 Retinal neovascularization, unspecified, bilateral: Secondary | ICD-10-CM | POA: Diagnosis not present

## 2014-01-16 DIAGNOSIS — E11329 Type 2 diabetes mellitus with mild nonproliferative diabetic retinopathy without macular edema: Secondary | ICD-10-CM | POA: Diagnosis not present

## 2014-01-16 DIAGNOSIS — H3532 Exudative age-related macular degeneration: Secondary | ICD-10-CM | POA: Diagnosis not present

## 2014-01-24 ENCOUNTER — Other Ambulatory Visit: Payer: Self-pay | Admitting: Geriatric Medicine

## 2014-01-24 ENCOUNTER — Ambulatory Visit
Admission: RE | Admit: 2014-01-24 | Discharge: 2014-01-24 | Disposition: A | Discharge status: Home / Self Care | Payer: Medicare Other | Source: Ambulatory Visit | Attending: Geriatric Medicine | Admitting: Geriatric Medicine

## 2014-01-24 DIAGNOSIS — I82411 Acute embolism and thrombosis of right femoral vein: Secondary | ICD-10-CM | POA: Diagnosis not present

## 2014-01-24 DIAGNOSIS — I82431 Acute embolism and thrombosis of right popliteal vein: Secondary | ICD-10-CM | POA: Diagnosis not present

## 2014-01-24 DIAGNOSIS — M7989 Other specified soft tissue disorders: Secondary | ICD-10-CM

## 2014-01-24 DIAGNOSIS — N183 Chronic kidney disease, stage 3 (moderate): Secondary | ICD-10-CM | POA: Diagnosis not present

## 2014-01-24 DIAGNOSIS — M79604 Pain in right leg: Secondary | ICD-10-CM

## 2014-01-24 DIAGNOSIS — R609 Edema, unspecified: Secondary | ICD-10-CM | POA: Diagnosis not present

## 2014-01-24 DIAGNOSIS — R0600 Dyspnea, unspecified: Secondary | ICD-10-CM | POA: Diagnosis not present

## 2014-01-24 DIAGNOSIS — I129 Hypertensive chronic kidney disease with stage 1 through stage 4 chronic kidney disease, or unspecified chronic kidney disease: Secondary | ICD-10-CM | POA: Diagnosis not present

## 2014-01-28 DIAGNOSIS — H3530 Unspecified macular degeneration: Secondary | ICD-10-CM | POA: Diagnosis not present

## 2014-01-28 DIAGNOSIS — E1122 Type 2 diabetes mellitus with diabetic chronic kidney disease: Secondary | ICD-10-CM | POA: Diagnosis not present

## 2014-01-28 DIAGNOSIS — E780 Pure hypercholesterolemia: Secondary | ICD-10-CM | POA: Diagnosis not present

## 2014-01-28 DIAGNOSIS — I120 Hypertensive chronic kidney disease with stage 5 chronic kidney disease or end stage renal disease: Secondary | ICD-10-CM | POA: Diagnosis not present

## 2014-01-28 DIAGNOSIS — Z7901 Long term (current) use of anticoagulants: Secondary | ICD-10-CM | POA: Diagnosis not present

## 2014-01-28 DIAGNOSIS — Z5181 Encounter for therapeutic drug level monitoring: Secondary | ICD-10-CM | POA: Diagnosis not present

## 2014-01-28 DIAGNOSIS — Z794 Long term (current) use of insulin: Secondary | ICD-10-CM | POA: Diagnosis not present

## 2014-01-28 DIAGNOSIS — D126 Benign neoplasm of colon, unspecified: Secondary | ICD-10-CM | POA: Diagnosis not present

## 2014-01-28 DIAGNOSIS — I129 Hypertensive chronic kidney disease with stage 1 through stage 4 chronic kidney disease, or unspecified chronic kidney disease: Secondary | ICD-10-CM | POA: Diagnosis not present

## 2014-01-28 DIAGNOSIS — I82401 Acute embolism and thrombosis of unspecified deep veins of right lower extremity: Secondary | ICD-10-CM | POA: Diagnosis not present

## 2014-01-28 DIAGNOSIS — N183 Chronic kidney disease, stage 3 (moderate): Secondary | ICD-10-CM | POA: Diagnosis not present

## 2014-01-28 DIAGNOSIS — R600 Localized edema: Secondary | ICD-10-CM | POA: Diagnosis not present

## 2014-01-29 DIAGNOSIS — I129 Hypertensive chronic kidney disease with stage 1 through stage 4 chronic kidney disease, or unspecified chronic kidney disease: Secondary | ICD-10-CM | POA: Diagnosis not present

## 2014-01-29 DIAGNOSIS — R600 Localized edema: Secondary | ICD-10-CM | POA: Diagnosis not present

## 2014-01-29 DIAGNOSIS — I82401 Acute embolism and thrombosis of unspecified deep veins of right lower extremity: Secondary | ICD-10-CM | POA: Diagnosis not present

## 2014-01-29 DIAGNOSIS — N183 Chronic kidney disease, stage 3 (moderate): Secondary | ICD-10-CM | POA: Diagnosis not present

## 2014-01-29 DIAGNOSIS — H3530 Unspecified macular degeneration: Secondary | ICD-10-CM | POA: Diagnosis not present

## 2014-01-29 DIAGNOSIS — E1122 Type 2 diabetes mellitus with diabetic chronic kidney disease: Secondary | ICD-10-CM | POA: Diagnosis not present

## 2014-01-31 DIAGNOSIS — N183 Chronic kidney disease, stage 3 (moderate): Secondary | ICD-10-CM | POA: Diagnosis not present

## 2014-01-31 DIAGNOSIS — H3530 Unspecified macular degeneration: Secondary | ICD-10-CM | POA: Diagnosis not present

## 2014-01-31 DIAGNOSIS — I82401 Acute embolism and thrombosis of unspecified deep veins of right lower extremity: Secondary | ICD-10-CM | POA: Diagnosis not present

## 2014-01-31 DIAGNOSIS — I129 Hypertensive chronic kidney disease with stage 1 through stage 4 chronic kidney disease, or unspecified chronic kidney disease: Secondary | ICD-10-CM | POA: Diagnosis not present

## 2014-01-31 DIAGNOSIS — E1122 Type 2 diabetes mellitus with diabetic chronic kidney disease: Secondary | ICD-10-CM | POA: Diagnosis not present

## 2014-01-31 DIAGNOSIS — R600 Localized edema: Secondary | ICD-10-CM | POA: Diagnosis not present

## 2014-02-01 DIAGNOSIS — I82401 Acute embolism and thrombosis of unspecified deep veins of right lower extremity: Secondary | ICD-10-CM | POA: Diagnosis not present

## 2014-02-01 DIAGNOSIS — Z7901 Long term (current) use of anticoagulants: Secondary | ICD-10-CM | POA: Diagnosis not present

## 2014-02-05 DIAGNOSIS — E1122 Type 2 diabetes mellitus with diabetic chronic kidney disease: Secondary | ICD-10-CM | POA: Diagnosis not present

## 2014-02-05 DIAGNOSIS — N183 Chronic kidney disease, stage 3 (moderate): Secondary | ICD-10-CM | POA: Diagnosis not present

## 2014-02-05 DIAGNOSIS — H3530 Unspecified macular degeneration: Secondary | ICD-10-CM | POA: Diagnosis not present

## 2014-02-05 DIAGNOSIS — I129 Hypertensive chronic kidney disease with stage 1 through stage 4 chronic kidney disease, or unspecified chronic kidney disease: Secondary | ICD-10-CM | POA: Diagnosis not present

## 2014-02-05 DIAGNOSIS — R600 Localized edema: Secondary | ICD-10-CM | POA: Diagnosis not present

## 2014-02-05 DIAGNOSIS — I82401 Acute embolism and thrombosis of unspecified deep veins of right lower extremity: Secondary | ICD-10-CM | POA: Diagnosis not present

## 2014-02-08 DIAGNOSIS — I129 Hypertensive chronic kidney disease with stage 1 through stage 4 chronic kidney disease, or unspecified chronic kidney disease: Secondary | ICD-10-CM | POA: Diagnosis not present

## 2014-02-08 DIAGNOSIS — I82401 Acute embolism and thrombosis of unspecified deep veins of right lower extremity: Secondary | ICD-10-CM | POA: Diagnosis not present

## 2014-02-08 DIAGNOSIS — N183 Chronic kidney disease, stage 3 (moderate): Secondary | ICD-10-CM | POA: Diagnosis not present

## 2014-02-08 DIAGNOSIS — E1122 Type 2 diabetes mellitus with diabetic chronic kidney disease: Secondary | ICD-10-CM | POA: Diagnosis not present

## 2014-02-08 DIAGNOSIS — H3530 Unspecified macular degeneration: Secondary | ICD-10-CM | POA: Diagnosis not present

## 2014-02-08 DIAGNOSIS — R600 Localized edema: Secondary | ICD-10-CM | POA: Diagnosis not present

## 2014-02-15 DIAGNOSIS — H3530 Unspecified macular degeneration: Secondary | ICD-10-CM | POA: Diagnosis not present

## 2014-02-15 DIAGNOSIS — N183 Chronic kidney disease, stage 3 (moderate): Secondary | ICD-10-CM | POA: Diagnosis not present

## 2014-02-15 DIAGNOSIS — R600 Localized edema: Secondary | ICD-10-CM | POA: Diagnosis not present

## 2014-02-15 DIAGNOSIS — E1122 Type 2 diabetes mellitus with diabetic chronic kidney disease: Secondary | ICD-10-CM | POA: Diagnosis not present

## 2014-02-15 DIAGNOSIS — I82401 Acute embolism and thrombosis of unspecified deep veins of right lower extremity: Secondary | ICD-10-CM | POA: Diagnosis not present

## 2014-02-15 DIAGNOSIS — I129 Hypertensive chronic kidney disease with stage 1 through stage 4 chronic kidney disease, or unspecified chronic kidney disease: Secondary | ICD-10-CM | POA: Diagnosis not present

## 2014-02-21 ENCOUNTER — Telehealth: Payer: Self-pay | Admitting: Internal Medicine

## 2014-02-21 NOTE — Telephone Encounter (Signed)
Spoke with pt's wife. Advised her that on 12/22/13, we gave pt a prescription for Zolpidem with 5 additional refills. She thought this was a medication that had to be called in every month. Advised her that should could their pharmacy and they should be able to refill this for her. She agreed and verbalized understanding. Nothing further was needed.

## 2014-02-22 DIAGNOSIS — I82401 Acute embolism and thrombosis of unspecified deep veins of right lower extremity: Secondary | ICD-10-CM | POA: Diagnosis not present

## 2014-02-22 DIAGNOSIS — I129 Hypertensive chronic kidney disease with stage 1 through stage 4 chronic kidney disease, or unspecified chronic kidney disease: Secondary | ICD-10-CM | POA: Diagnosis not present

## 2014-02-22 DIAGNOSIS — R2681 Unsteadiness on feet: Secondary | ICD-10-CM | POA: Diagnosis not present

## 2014-02-22 DIAGNOSIS — Z0000 Encounter for general adult medical examination without abnormal findings: Secondary | ICD-10-CM | POA: Diagnosis not present

## 2014-02-22 DIAGNOSIS — Z1389 Encounter for screening for other disorder: Secondary | ICD-10-CM | POA: Diagnosis not present

## 2014-02-22 DIAGNOSIS — N183 Chronic kidney disease, stage 3 (moderate): Secondary | ICD-10-CM | POA: Diagnosis not present

## 2014-02-23 DIAGNOSIS — M25511 Pain in right shoulder: Secondary | ICD-10-CM | POA: Diagnosis not present

## 2014-02-23 DIAGNOSIS — M7541 Impingement syndrome of right shoulder: Secondary | ICD-10-CM | POA: Diagnosis not present

## 2014-03-05 DIAGNOSIS — I82401 Acute embolism and thrombosis of unspecified deep veins of right lower extremity: Secondary | ICD-10-CM | POA: Diagnosis not present

## 2014-03-05 DIAGNOSIS — R600 Localized edema: Secondary | ICD-10-CM | POA: Diagnosis not present

## 2014-03-05 DIAGNOSIS — N183 Chronic kidney disease, stage 3 (moderate): Secondary | ICD-10-CM | POA: Diagnosis not present

## 2014-03-05 DIAGNOSIS — H3530 Unspecified macular degeneration: Secondary | ICD-10-CM | POA: Diagnosis not present

## 2014-03-05 DIAGNOSIS — E1122 Type 2 diabetes mellitus with diabetic chronic kidney disease: Secondary | ICD-10-CM | POA: Diagnosis not present

## 2014-03-05 DIAGNOSIS — I129 Hypertensive chronic kidney disease with stage 1 through stage 4 chronic kidney disease, or unspecified chronic kidney disease: Secondary | ICD-10-CM | POA: Diagnosis not present

## 2014-03-19 DIAGNOSIS — M7541 Impingement syndrome of right shoulder: Secondary | ICD-10-CM | POA: Diagnosis not present

## 2014-03-19 DIAGNOSIS — M25511 Pain in right shoulder: Secondary | ICD-10-CM | POA: Diagnosis not present

## 2014-03-27 DIAGNOSIS — H3532 Exudative age-related macular degeneration: Secondary | ICD-10-CM | POA: Diagnosis not present

## 2014-03-27 DIAGNOSIS — Z961 Presence of intraocular lens: Secondary | ICD-10-CM | POA: Diagnosis not present

## 2014-03-27 DIAGNOSIS — E119 Type 2 diabetes mellitus without complications: Secondary | ICD-10-CM | POA: Diagnosis not present

## 2014-04-03 DIAGNOSIS — I82409 Acute embolism and thrombosis of unspecified deep veins of unspecified lower extremity: Secondary | ICD-10-CM | POA: Diagnosis not present

## 2014-04-09 DIAGNOSIS — I82409 Acute embolism and thrombosis of unspecified deep veins of unspecified lower extremity: Secondary | ICD-10-CM | POA: Diagnosis not present

## 2014-04-16 DIAGNOSIS — M25511 Pain in right shoulder: Secondary | ICD-10-CM | POA: Diagnosis not present

## 2014-04-16 DIAGNOSIS — M7541 Impingement syndrome of right shoulder: Secondary | ICD-10-CM | POA: Diagnosis not present

## 2014-04-16 DIAGNOSIS — M545 Low back pain: Secondary | ICD-10-CM | POA: Diagnosis not present

## 2014-04-18 DIAGNOSIS — C61 Malignant neoplasm of prostate: Secondary | ICD-10-CM | POA: Diagnosis not present

## 2014-04-18 DIAGNOSIS — N3281 Overactive bladder: Secondary | ICD-10-CM | POA: Diagnosis not present

## 2014-04-18 DIAGNOSIS — N3941 Urge incontinence: Secondary | ICD-10-CM | POA: Diagnosis not present

## 2014-04-18 DIAGNOSIS — N320 Bladder-neck obstruction: Secondary | ICD-10-CM | POA: Diagnosis not present

## 2014-04-23 DIAGNOSIS — I82409 Acute embolism and thrombosis of unspecified deep veins of unspecified lower extremity: Secondary | ICD-10-CM | POA: Diagnosis not present

## 2014-04-30 DIAGNOSIS — M545 Low back pain: Secondary | ICD-10-CM | POA: Diagnosis not present

## 2014-04-30 DIAGNOSIS — M47816 Spondylosis without myelopathy or radiculopathy, lumbar region: Secondary | ICD-10-CM | POA: Diagnosis not present

## 2014-05-07 DIAGNOSIS — I82409 Acute embolism and thrombosis of unspecified deep veins of unspecified lower extremity: Secondary | ICD-10-CM | POA: Diagnosis not present

## 2014-05-26 ENCOUNTER — Emergency Department (HOSPITAL_COMMUNITY)
Admission: EM | Admit: 2014-05-26 | Discharge: 2014-05-26 | Disposition: A | Discharge status: Home / Self Care | Payer: Medicare Other | Attending: Emergency Medicine | Admitting: Emergency Medicine

## 2014-05-26 ENCOUNTER — Emergency Department (HOSPITAL_COMMUNITY): Payer: Medicare Other

## 2014-05-26 ENCOUNTER — Encounter (HOSPITAL_COMMUNITY): Payer: Self-pay | Admitting: Emergency Medicine

## 2014-05-26 DIAGNOSIS — Z86718 Personal history of other venous thrombosis and embolism: Secondary | ICD-10-CM | POA: Diagnosis not present

## 2014-05-26 DIAGNOSIS — S4992XA Unspecified injury of left shoulder and upper arm, initial encounter: Secondary | ICD-10-CM | POA: Insufficient documentation

## 2014-05-26 DIAGNOSIS — R630 Anorexia: Secondary | ICD-10-CM | POA: Insufficient documentation

## 2014-05-26 DIAGNOSIS — S0993XA Unspecified injury of face, initial encounter: Secondary | ICD-10-CM | POA: Diagnosis not present

## 2014-05-26 DIAGNOSIS — S01412A Laceration without foreign body of left cheek and temporomandibular area, initial encounter: Secondary | ICD-10-CM | POA: Diagnosis not present

## 2014-05-26 DIAGNOSIS — M7989 Other specified soft tissue disorders: Secondary | ICD-10-CM | POA: Diagnosis not present

## 2014-05-26 DIAGNOSIS — Z791 Long term (current) use of non-steroidal anti-inflammatories (NSAID): Secondary | ICD-10-CM | POA: Insufficient documentation

## 2014-05-26 DIAGNOSIS — S0180XA Unspecified open wound of other part of head, initial encounter: Secondary | ICD-10-CM | POA: Diagnosis not present

## 2014-05-26 DIAGNOSIS — S0181XA Laceration without foreign body of other part of head, initial encounter: Secondary | ICD-10-CM | POA: Diagnosis not present

## 2014-05-26 DIAGNOSIS — I517 Cardiomegaly: Secondary | ICD-10-CM | POA: Diagnosis not present

## 2014-05-26 DIAGNOSIS — E162 Hypoglycemia, unspecified: Secondary | ICD-10-CM

## 2014-05-26 DIAGNOSIS — Y9301 Activity, walking, marching and hiking: Secondary | ICD-10-CM | POA: Insufficient documentation

## 2014-05-26 DIAGNOSIS — W1839XA Other fall on same level, initial encounter: Secondary | ICD-10-CM | POA: Diagnosis not present

## 2014-05-26 DIAGNOSIS — K219 Gastro-esophageal reflux disease without esophagitis: Secondary | ICD-10-CM | POA: Diagnosis not present

## 2014-05-26 DIAGNOSIS — Z79899 Other long term (current) drug therapy: Secondary | ICD-10-CM | POA: Insufficient documentation

## 2014-05-26 DIAGNOSIS — S01511A Laceration without foreign body of lip, initial encounter: Secondary | ICD-10-CM | POA: Insufficient documentation

## 2014-05-26 DIAGNOSIS — R51 Headache: Secondary | ICD-10-CM | POA: Diagnosis not present

## 2014-05-26 DIAGNOSIS — J45909 Unspecified asthma, uncomplicated: Secondary | ICD-10-CM | POA: Diagnosis not present

## 2014-05-26 DIAGNOSIS — Z794 Long term (current) use of insulin: Secondary | ICD-10-CM | POA: Insufficient documentation

## 2014-05-26 DIAGNOSIS — R55 Syncope and collapse: Secondary | ICD-10-CM | POA: Diagnosis not present

## 2014-05-26 DIAGNOSIS — Z872 Personal history of diseases of the skin and subcutaneous tissue: Secondary | ICD-10-CM | POA: Insufficient documentation

## 2014-05-26 DIAGNOSIS — H3530 Unspecified macular degeneration: Secondary | ICD-10-CM | POA: Diagnosis not present

## 2014-05-26 DIAGNOSIS — S299XXA Unspecified injury of thorax, initial encounter: Secondary | ICD-10-CM | POA: Diagnosis not present

## 2014-05-26 DIAGNOSIS — E039 Hypothyroidism, unspecified: Secondary | ICD-10-CM | POA: Insufficient documentation

## 2014-05-26 DIAGNOSIS — H9190 Unspecified hearing loss, unspecified ear: Secondary | ICD-10-CM | POA: Insufficient documentation

## 2014-05-26 DIAGNOSIS — N289 Disorder of kidney and ureter, unspecified: Secondary | ICD-10-CM | POA: Insufficient documentation

## 2014-05-26 DIAGNOSIS — E11649 Type 2 diabetes mellitus with hypoglycemia without coma: Secondary | ICD-10-CM | POA: Diagnosis not present

## 2014-05-26 DIAGNOSIS — Z8546 Personal history of malignant neoplasm of prostate: Secondary | ICD-10-CM | POA: Insufficient documentation

## 2014-05-26 DIAGNOSIS — Y9289 Other specified places as the place of occurrence of the external cause: Secondary | ICD-10-CM | POA: Insufficient documentation

## 2014-05-26 DIAGNOSIS — W19XXXA Unspecified fall, initial encounter: Secondary | ICD-10-CM

## 2014-05-26 DIAGNOSIS — Z87442 Personal history of urinary calculi: Secondary | ICD-10-CM | POA: Diagnosis not present

## 2014-05-26 DIAGNOSIS — Z85828 Personal history of other malignant neoplasm of skin: Secondary | ICD-10-CM | POA: Diagnosis not present

## 2014-05-26 DIAGNOSIS — S0081XA Abrasion of other part of head, initial encounter: Secondary | ICD-10-CM | POA: Diagnosis not present

## 2014-05-26 DIAGNOSIS — Y998 Other external cause status: Secondary | ICD-10-CM | POA: Diagnosis not present

## 2014-05-26 DIAGNOSIS — S0990XA Unspecified injury of head, initial encounter: Secondary | ICD-10-CM | POA: Diagnosis not present

## 2014-05-26 HISTORY — DX: Acute embolism and thrombosis of unspecified deep veins of unspecified proximal lower extremity: I82.4Y9

## 2014-05-26 LAB — BASIC METABOLIC PANEL
Anion gap: 7 (ref 5–15)
BUN: 26 mg/dL — ABNORMAL HIGH (ref 6–20)
CALCIUM: 8.6 mg/dL — AB (ref 8.9–10.3)
CHLORIDE: 106 mmol/L (ref 101–111)
CO2: 26 mmol/L (ref 22–32)
Creatinine, Ser: 1.31 mg/dL — ABNORMAL HIGH (ref 0.61–1.24)
GFR calc Af Amer: 58 mL/min — ABNORMAL LOW (ref >60)
GFR calc non Af Amer: 50 mL/min — ABNORMAL LOW (ref >60)
Glucose, Bld: 79 mg/dL (ref 65–99)
Potassium: 3.9 mmol/L (ref 3.5–5.1)
SODIUM: 139 mmol/L (ref 135–145)

## 2014-05-26 LAB — CBC WITH DIFFERENTIAL/PLATELET
Basophils Absolute: 0 10*3/uL (ref 0.0–0.1)
Basophils Relative: 1 % (ref 0–1)
EOS ABS: 0.2 10*3/uL (ref 0.0–0.7)
Eosinophils Relative: 3 % (ref 0–5)
HCT: 39.5 % (ref 39.0–52.0)
Hemoglobin: 13.3 g/dL (ref 13.0–17.0)
LYMPHS ABS: 1.4 10*3/uL (ref 0.7–4.0)
Lymphocytes Relative: 18 % (ref 12–46)
MCH: 30.6 pg (ref 26.0–34.0)
MCHC: 33.7 g/dL (ref 30.0–36.0)
MCV: 91 fL (ref 78.0–100.0)
MONO ABS: 0.8 10*3/uL (ref 0.1–1.0)
Monocytes Relative: 10 % (ref 3–12)
NEUTROS PCT: 68 % (ref 43–77)
Neutro Abs: 5 10*3/uL (ref 1.7–7.7)
Platelets: 214 10*3/uL (ref 150–400)
RBC: 4.34 MIL/uL (ref 4.22–5.81)
RDW: 13.2 % (ref 11.5–15.5)
WBC: 7.4 10*3/uL (ref 4.0–10.5)

## 2014-05-26 LAB — URINALYSIS, ROUTINE W REFLEX MICROSCOPIC
Bilirubin Urine: NEGATIVE
Glucose, UA: NEGATIVE mg/dL
Hgb urine dipstick: NEGATIVE
Ketones, ur: NEGATIVE mg/dL
LEUKOCYTES UA: NEGATIVE
NITRITE: NEGATIVE
Protein, ur: NEGATIVE mg/dL
Specific Gravity, Urine: 1.015 (ref 1.005–1.030)
Urobilinogen, UA: 0.2 mg/dL (ref 0.0–1.0)
pH: 5 (ref 5.0–8.0)

## 2014-05-26 LAB — TROPONIN I

## 2014-05-26 LAB — CBG MONITORING, ED
Glucose-Capillary: 88 mg/dL (ref 65–99)
Glucose-Capillary: 128 mg/dL — ABNORMAL HIGH (ref 65–99)

## 2014-05-26 LAB — DIGOXIN LEVEL: Digoxin Level: 0.5 ng/mL — ABNORMAL LOW (ref 0.8–2.0)

## 2014-05-26 LAB — PROTIME-INR
INR: 2.14 — AB (ref 0.00–1.49)
Prothrombin Time: 24.1 seconds — ABNORMAL HIGH (ref 11.6–15.2)

## 2014-05-26 MED ORDER — SODIUM CHLORIDE 0.9 % IV BOLUS (SEPSIS)
500.0000 mL | Freq: Once | INTRAVENOUS | Status: AC
  Administered 2014-05-26: 500 mL via INTRAVENOUS

## 2014-05-26 NOTE — ED Notes (Signed)
Pt from [facility] via EMS-Per EMS staff found pt lying on sidewalk. Pt has no memory of falling. Staff checked CBG and was found to be 59. Pt was given glucose tabs and OJ. EMS CBG on scene was 77. Pt pas spinal assessment and denies neck/back pain. EKG on scene was negative. Pt has small lac to upper lip and L cheek. Pt also noted to have abrasions to face. Pt is A&O and in NAD. Pt is not on blood thinners.

## 2014-05-26 NOTE — ED Provider Notes (Signed)
CSN: [id number]     Arrival date & time 05/26/14  1910 History   First MD Initiated Contact with Patient 05/26/14 1913     Chief Complaint  Patient presents with  . Fall  . Hypoglycemia     (Consider location/radiation/quality/duration/timing/severity/associated sxs/prior Treatment) Patient is a 81 y.o. male presenting with fall and hypoglycemia. The history is provided by the patient.  Fall This is a new problem. Pertinent negatives include no abdominal pain, no headaches and no shortness of breath.  Hypoglycemia Associated symptoms: no shortness of breath, no vomiting and no weakness    patient with fall and syncope. Found by EMS on the ground walking dog. Found to be hypoglycemic with sugar of 59. Given some sugar and then felt better. Abrasion to forehead and laceration to left cheek. Patient states he ran out of his testing supplies and is not checked the last several days. States he still been taking his medicines at same doses. States he did eat dinner tonight. Slight pain in his left shoulder. Thinks that he landed on it. No neck pain. No current confusion.  Past Medical History  Diagnosis Date  . History of skin cancer   . Hypothyroidism   . Asthmatic bronchitis   . Allergic rhinitis   . Urticaria   . Dyslipidemia   . DM type 2 (diabetes mellitus, type 2)   . Macular degeneration     injections done bilaterally recent - 08-22-13  . Dysrhythmia     '97 tachycardia- no recent issues  . Arthritis     arthritis-kyphosis  . GERD (gastroesophageal reflux disease)   . 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 Urgency-frequency syndrome   . History of kidney stones   . Hearing impaired     bilateral hearing aids  . DVT, lower extremity, proximal, acute    Past Surgical History  Procedure Laterality Date  . Popliteal synovial cyst excision  2005  . Gallbladder surgery  2006    no cholecystectomy  . Total  knee arthroplasty  2008    right  . Total knee arthroplasty  2010    left  . Partial thymectomy  1985    for nodules  . Lung removal, partial  age 58    for bronchiectasis  . Cataract surgery Bilateral   . Colonoscopy w/ polypectomy      x2   . Cystoscopy with retrograde pyelogram, ureteroscopy and stent placement    . Back surgery      lumbar fracture  . Eye surgery      macular degeneration  . Hernia repair    . Cystoscopy with injection N/A 09/11/2013    Procedure: CYSTOSCOPY WITH BOTOX INJECTION INTO THE BLADDER;  Surgeon: Ailene Rud, MD;  Location: [facility];  Service: Urology;  Laterality: N/A;   Family History  Problem Relation Age of Onset  . Other Sister     ophth disease  . Heart attack Father     x7  . Heart disease Father     MI x7  . Hypertension Mother   . Stroke Mother 34   History  Substance Use Topics  . Smoking status: Never Smoker   . Smokeless tobacco: Never Used  . Alcohol Use: No    Review of Systems  Constitutional: Positive for appetite change. Negative for activity change.  Eyes: Negative for pain.  Respiratory: Negative for chest tightness and shortness of breath.  Cardiovascular: Negative for leg swelling.  Gastrointestinal: Negative for nausea, vomiting, abdominal pain and diarrhea.  Genitourinary: Negative for flank pain.  Musculoskeletal: Negative for back pain and neck stiffness.  Skin: Positive for wound. Negative for rash.  Neurological: Positive for syncope. Negative for weakness, numbness and headaches.  Psychiatric/Behavioral: Negative for behavioral problems.      Allergies  Colchicine; Atorvastatin; Ceftin; Codeine; Erythromycin; Ezetimibe; Hibiclens; Nitroglycerin; Oxycodone-acetaminophen; Rosuvastatin; Simvastatin; Vantin; Contrast media; and Tetanus toxoid  Home Medications   Prior to Admission medications   Medication Sig Start Date End Date Taking? Authorizing Provider  acetaminophen (TYLENOL) 500 MG tablet  Take 1,000 mg by mouth every 6 (six) hours as needed for mild pain or moderate pain.   Yes Historical Provider, MD  Cholecalciferol (VITAMIN D) 2000 UNITS tablet Take 2,000 Units by mouth daily.   Yes Historical Provider, MD  colesevelam (WELCHOL) 625 MG tablet Take 1,250 mg by mouth 3 (three) times daily.   Yes Historical Provider, MD  COMBIGAN 0.2-0.5 % ophthalmic solution Place 1 drop into both eyes every 12 (twelve) hours.  05/25/14  Yes Historical Provider, MD  DIGITEK 125 MCG tablet Take 1 tablet by mouth daily. 05/02/14  Yes Historical Provider, MD  diltiazem (CARDIZEM CD) 240 MG 24 hr capsule Take 240 mg by mouth every evening.   Yes Historical Provider, MD  docusate sodium (COLACE) 100 MG capsule Take 100 mg by mouth daily as needed for mild constipation.   Yes Historical Provider, MD  furosemide (LASIX) 20 MG tablet Take 20 mg by mouth every morning.    Yes Historical Provider, MD  glucose 4 GM chewable tablet Chew 1-2 tablets by mouth as needed for low blood sugar.   Yes Historical Provider, MD  insulin glargine (LANTUS) 100 UNIT/ML injection Inject 10 Units into the skin at bedtime. Takes 10 units if CBG is 250; at 150 takes 5-6 units   Yes Historical Provider, MD  insulin regular (NOVOLIN R,HUMULIN R) 100 units/mL injection Inject 5-7 Units into the skin 3 (three) times daily before meals. 5-7 units according to sliding scale   Yes Historical Provider, MD  levothyroxine (SYNTHROID) 200 MCG tablet Take 200 mcg by mouth daily before breakfast. Take with 50 mcg tablet   Yes Historical Provider, MD  levothyroxine (SYNTHROID, LEVOTHROID) 50 MCG tablet Take 50 mcg by mouth daily before breakfast.   Yes Historical Provider, MD  Multiple Vitamins-Minerals (OCUVITE EXTRA PO) Take 1 tablet by mouth daily.    Yes Historical Provider, MD  omeprazole (PRILOSEC) 20 MG capsule Take 20 mg by mouth daily.   Yes Historical Provider, MD  polyethylene glycol (MIRALAX / GLYCOLAX) packet Take 17 g by mouth  daily.   Yes Historical Provider, MD  warfarin (COUMADIN) 1 MG tablet Take 1 tablet by mouth as directed. Take  with a 5mg  tablet for a total of 6mg  on every third day 02/27/14  Yes Historical Provider, MD  warfarin (COUMADIN) 5 MG tablet Take 5 mg by mouth daily. Takes 5mg  for 2days, then 6mg  for 1day; then repeat  (Example: Monday, Tuesday, take 5mg , Wednesday take 6mg , Thursday, Friday take 5mg , Saturday take 6mg , etc..)   Yes Historical Provider, MD  zolpidem (AMBIEN) 5 MG tablet Take 1 tablet (5 mg total) by mouth at bedtime as needed for sleep. 12/22/13  Yes Deneise Lever, MD  ipratropium (ATROVENT) 0.03 % nasal spray Place 2 sprays into both nostrils every 12 (twelve) hours. Patient not taking: Reported on 05/26/2014 12/22/13   [facility]  Lucia Estelle, MD   BP 164/82 mmHg  Pulse 76  Temp(Src) 97.9 F (36.6 C) (Oral)  Resp 18  SpO2 95% Physical Exam  HENT:  Abrasion to right side of forehead. Somewhat superficial laceration to left cheek horizontally over anterior face. Underlying bony structure stable. Small half centimeter laceration to mucosal aspect of left upper lip.  Neck: Normal range of motion. Neck supple.  Cardiovascular: Regular rhythm.   Pulmonary/Chest: Effort normal.  Abdominal: Soft. There is no tenderness.  Musculoskeletal: Normal range of motion.  Neurological: He is alert.  Skin: Skin is warm.  Psychiatric: He has a normal mood and affect.    ED Course  Procedures (including critical care time) Labs Review Labs Reviewed  BASIC METABOLIC PANEL - Abnormal; Notable for the following:    BUN 26 (*)    Creatinine, Ser 1.31 (*)    Calcium 8.6 (*)    GFR calc non Af Amer 50 (*)    GFR calc Af Amer 58 (*)    All other components within normal limits  DIGOXIN LEVEL - Abnormal; Notable for the following:    Digoxin Level 0.5 (*)    All other components within normal limits  PROTIME-INR - Abnormal; Notable for the following:    Prothrombin Time 24.1 (*)    INR 2.14 (*)     All other components within normal limits  CBG MONITORING, ED - Abnormal; Notable for the following:    Glucose-Capillary 128 (*)    All other components within normal limits  TROPONIN I  URINALYSIS, ROUTINE W REFLEX MICROSCOPIC  CBC WITH DIFFERENTIAL/PLATELET  CBG MONITORING, ED    Imaging Review Dg Chest 2 View  05/26/2014   CLINICAL DATA:  Fall.  Hypoglycemia.  EXAM: CHEST  2 VIEW  COMPARISON:  05/08/2013 and 12/28/2012  FINDINGS: Lungs are adequately inflated without focal consolidation or effusion. There is no pneumothorax. Cardiomediastinal silhouette is within normal. There is minimal calcification over the aortic arch. There are degenerative changes of the spine. Chronic stable deformity of the left seventh rib.  IMPRESSION: No active cardiopulmonary disease.   Electronically Signed   By: Marin Olp M.D.   On: 05/26/2014 21:21   Ct Head Wo Contrast  05/26/2014   CLINICAL DATA:  81 year old male with fall, facial abrasion and swelling and headache. Initial encounter.  EXAM: CT HEAD WITHOUT CONTRAST  CT MAXILLOFACIAL WITHOUT CONTRAST  TECHNIQUE: Multidetector CT imaging of the head and maxillofacial structures were performed using the standard protocol without intravenous contrast. Multiplanar CT image reconstructions of the maxillofacial structures were also generated.  COMPARISON:  03/13/2010 head CT  FINDINGS: CT HEAD FINDINGS  Moderate atrophy and chronic small-vessel white matter ischemic changes again noted.  No acute intracranial abnormalities are identified, including mass lesion or mass effect, hydrocephalus, extra-axial fluid collection, midline shift, hemorrhage, or acute infarction. The visualized bony calvarium is unremarkable. New  CT MAXILLOFACIAL FINDINGS  Left preseptal soft tissue swelling is noted. There is no evidence of acute fracture, subluxation or dislocation.  The paranasal sinuses are clear.  The orbits and globes are unremarkable.  There is no evidence of  intraconal or postseptal abnormality.  Nasal septal deviation to the left is present.  IMPRESSION: No evidence of acute intracranial abnormality. Moderate atrophy and chronic small-vessel white matter ischemic changes.  Left preseptal soft tissue swelling without facial fracture.   Electronically Signed   By: Margarette Canada M.D.   On: 05/26/2014 21:54   Ct Maxillofacial Wo Cm  05/26/2014  CLINICAL DATA:  81 year old male with fall, facial abrasion and swelling and headache. Initial encounter.  EXAM: CT HEAD WITHOUT CONTRAST  CT MAXILLOFACIAL WITHOUT CONTRAST  TECHNIQUE: Multidetector CT imaging of the head and maxillofacial structures were performed using the standard protocol without intravenous contrast. Multiplanar CT image reconstructions of the maxillofacial structures were also generated.  COMPARISON:  03/13/2010 head CT  FINDINGS: CT HEAD FINDINGS  Moderate atrophy and chronic small-vessel white matter ischemic changes again noted.  No acute intracranial abnormalities are identified, including mass lesion or mass effect, hydrocephalus, extra-axial fluid collection, midline shift, hemorrhage, or acute infarction. The visualized bony calvarium is unremarkable. New  CT MAXILLOFACIAL FINDINGS  Left preseptal soft tissue swelling is noted. There is no evidence of acute fracture, subluxation or dislocation.  The paranasal sinuses are clear.  The orbits and globes are unremarkable.  There is no evidence of intraconal or postseptal abnormality.  Nasal septal deviation to the left is present.  IMPRESSION: No evidence of acute intracranial abnormality. Moderate atrophy and chronic small-vessel white matter ischemic changes.  Left preseptal soft tissue swelling without facial fracture.   Electronically Signed   By: Margarette Canada M.D.   On: 05/26/2014 21:54     EKG Interpretation   Date/Time:  Saturday May 26 2014 19:55:26 EDT Ventricular Rate:  70 PR Interval:  254 QRS Duration: 91 QT Interval:  380 QTC  Calculation: 410 R Axis:   67 Text Interpretation:  Sinus rhythm Prolonged PR interval Left ventricular  hypertrophy Confirmed by Alvino Chapel  MD, Ovid Curd [id number]) on 05/26/2014  9:03:43 PM      MDM   Final diagnoses:  Hypoglycemia  Fall, initial encounter  Facial laceration, initial encounter  Blunt trauma of face, initial encounter  Renal insufficiency    Patient with fall. With further interview the patient he did not actually pass out just felt a little unsteady and fell twice. No loss of consciousness. Does have a laceration. Laceration was closed with Dermabond. Was hypoglycemic which was a likely cause. He has a slightly worse renal function. Will need follow-up. Given IV fluid bolus. Sugars been stable here. He is tolerated orals he'll be discharged home.  LACERATION REPAIR Performed by: Mackie Pai Authorized by: Mackie Pai Consent: Verbal consent obtained. Risks and benefits: risks, benefits and alternatives were discussed Consent given by: patient Patient identity confirmed: provided demographic data Prepped and Draped in normal sterile fashion Wound explored  Laceration Location: Left cheek  Laceration Length: 2.5 cm  No Foreign Bodies seen or palpated   Alcohol scrub  Amount of cleaning: standard  Skin closure: Wound adhesive    Patient tolerance: Patient tolerated the procedure well with no immediate complications.      Davonna Belling, MD 05/26/14 2350

## 2014-05-26 NOTE — Discharge Instructions (Signed)
Facial Laceration ° A facial laceration is a cut on the face. These injuries can be painful and cause bleeding. Lacerations usually heal quickly, but they need special care to reduce scarring. °DIAGNOSIS  °Your health care provider will take a medical history, ask for details about how the injury occurred, and examine the wound to determine how deep the cut is. °TREATMENT  °Some facial lacerations may not require closure. Others may not be able to be closed because of an increased risk of infection. The risk of infection and the chance for successful closure will depend on various factors, including the amount of time since the injury occurred. °The wound may be cleaned to help prevent infection. If closure is appropriate, pain medicines may be given if needed. Your health care provider will use stitches (sutures), wound glue (adhesive), or skin adhesive strips to repair the laceration. These tools bring the skin edges together to allow for faster healing and a better cosmetic outcome. If needed, you may also be given a tetanus shot. °HOME CARE INSTRUCTIONS °· Only take over-the-counter or prescription medicines as directed by your health care provider. °· Follow your health care provider's instructions for wound care. These instructions will vary depending on the technique used for closing the wound. °For Sutures: °· Keep the wound clean and dry.   °· If you were given a bandage (dressing), you should change it at least once a day. Also change the dressing if it becomes wet or dirty, or as directed by your health care provider.   °· Wash the wound with soap and water 2 times a day. Rinse the wound off with water to remove all soap. Pat the wound dry with a clean towel.   °· After cleaning, apply a thin layer of the antibiotic ointment recommended by your health care provider. This will help prevent infection and keep the dressing from sticking.   °· You may shower as usual after the first 24 hours. Do not soak the  wound in water until the sutures are removed.   °· Get your sutures removed as directed by your health care provider. With facial lacerations, sutures should usually be taken out after 4-5 days to avoid stitch marks.   °· Wait a few days after your sutures are removed before applying any makeup. °For Skin Adhesive Strips: °· Keep the wound clean and dry.   °· Do not get the skin adhesive strips wet. You may bathe carefully, using caution to keep the wound dry.   °· If the wound gets wet, pat it dry with a clean towel.   °· Skin adhesive strips will fall off on their own. You may trim the strips as the wound heals. Do not remove skin adhesive strips that are still stuck to the wound. They will fall off in time.   °For Wound Adhesive: °· You may briefly wet your wound in the shower or bath. Do not soak or scrub the wound. Do not swim. Avoid periods of heavy sweating until the skin adhesive has fallen off on its own. After showering or bathing, gently pat the wound dry with a clean towel.   °· Do not apply liquid medicine, cream medicine, ointment medicine, or makeup to your wound while the skin adhesive is in place. This may loosen the film before your wound is healed.   °· If a dressing is placed over the wound, be careful not to apply tape directly over the skin adhesive. This may cause the adhesive to be pulled off before the wound is healed.   °· Avoid   prolonged exposure to sunlight or tanning lamps while the skin adhesive is in place.  The skin adhesive will usually remain in place for 5-10 days, then naturally fall off the skin. Do not pick at the adhesive film.  After Healing: Once the wound has healed, cover the wound with sunscreen during the day for 1 full year. This can help minimize scarring. Exposure to ultraviolet light in the first year will darken the scar. It can take 1-2 years for the scar to lose its redness and to heal completely.  SEEK IMMEDIATE MEDICAL CARE IF:  You have redness, pain, or  swelling around the wound.   You see ayellowish-white fluid (pus) coming from the wound.   You have chills or a fever.  MAKE SURE YOU:  Understand these instructions.  Will watch your condition.  Will get help right away if you are not doing well or get worse. Document Released: 02/06/2004 Document Revised: 10/19/2012 Document Reviewed: 08/11/2012 [facility] Patient Information 2015 [place], [place]. This information is not intended to replace advice given to you by your health care provider. Make sure you discuss any questions you have with your health care provider.  Hypoglycemia Hypoglycemia occurs when the glucose in your blood is too low. Glucose is a type of sugar that is your body's main energy source. Hormones, such as insulin and glucagon, control the level of glucose in the blood. Insulin lowers blood glucose and glucagon increases blood glucose. Having too much insulin in your blood stream, or not eating enough food containing sugar, can result in hypoglycemia. Hypoglycemia can happen to people with or without diabetes. It can develop quickly and can be a medical emergency.  CAUSES   Missing or delaying meals.  Not eating enough carbohydrates at meals.  Taking too much diabetes medicine.  Not timing your oral diabetes medicine or insulin doses with meals, snacks, and exercise.  Nausea and vomiting.  Certain medicines.  Severe illnesses, such as hepatitis, kidney disorders, and certain eating disorders.  Increased activity or exercise without eating something extra or adjusting medicines.  Drinking too much alcohol.  A nerve disorder that affects body functions like your heart rate, blood pressure, and digestion (autonomic neuropathy).  A condition where the stomach muscles do not function properly (gastroparesis). Therefore, medicines and food may not absorb properly.  Rarely, a tumor of the pancreas can produce too much insulin. SYMPTOMS   Hunger.  Sweating  (diaphoresis).  Change in body temperature.  Shakiness.  Headache.  Anxiety.  Lightheadedness.  Irritability.  Difficulty concentrating.  Dry mouth.  Tingling or numbness in the hands or feet.  Restless sleep or sleep disturbances.  Altered speech and coordination.  Change in mental status.  Seizures or prolonged convulsions.  Combativeness.  Drowsiness (lethargic).  Weakness.  Increased heart rate or palpitations.  Confusion.  Pale, gray skin color.  Blurred or double vision.  Fainting. DIAGNOSIS  A physical exam and medical history will be performed. Your caregiver may make a diagnosis based on your symptoms. Blood tests and other lab tests may be performed to confirm a diagnosis. Once the diagnosis is made, your caregiver will see if your signs and symptoms go away once your blood glucose is raised.  TREATMENT  Usually, you can easily treat your hypoglycemia when you notice symptoms.  Check your blood glucose. If it is less than 70 mg/dl, take one of the following:   3-4 glucose tablets.    cup juice.    cup regular soda.  1 cup skim milk.   -1 tube of glucose gel.   5-6 hard candies.   Avoid high-fat drinks or food that may delay a rise in blood glucose levels.  Do not take more than the recommended amount of sugary foods, drinks, gel, or tablets. Doing so will cause your blood glucose to go too high.   Wait 10-15 minutes and recheck your blood glucose. If it is still less than 70 mg/dl or below your target range, repeat treatment.   Eat a snack if it is more than 1 hour until your next meal.  There may be a time when your blood glucose may go so low that you are unable to treat yourself at home when you start to notice symptoms. You may need someone to help you. You may even faint or be unable to swallow. If you cannot treat yourself, someone will need to bring you to the hospital.  [place]  If you have  diabetes, follow your diabetes management plan by:  Taking your medicines as directed.  Following your exercise plan.  Following your meal plan. Do not skip meals. Eat on time.  Testing your blood glucose regularly. Check your blood glucose before and after exercise. If you exercise longer or different than usual, be sure to check blood glucose more frequently.  Wearing your medical alert jewelry that says you have diabetes.  Identify the cause of your hypoglycemia. Then, develop ways to prevent the recurrence of hypoglycemia.  Do not take a hot bath or shower right after an insulin shot.  Always carry treatment with you. Glucose tablets are the easiest to carry.  If you are going to drink alcohol, drink it only with meals.  Tell friends or family members ways to keep you safe during a seizure. This may include removing hard or sharp objects from the area or turning you on your side.  Maintain a healthy weight. SEEK MEDICAL CARE IF:   You are having problems keeping your blood glucose in your target range.  You are having frequent episodes of hypoglycemia.  You feel you might be having side effects from your medicines.  You are not sure why your blood glucose is dropping so low.  You notice a change in vision or a new problem with your vision. SEEK IMMEDIATE MEDICAL CARE IF:   Confusion develops.  A change in mental status occurs.  The inability to swallow develops.  Fainting occurs. Document Released: 12/29/2004 Document Revised: 01/03/2013 Document Reviewed: 04/27/2011 [facility] Patient Information 2015 [place], [place]. This information is not intended to replace advice given to you by your health care provider. Make sure you discuss any questions you have with your health care provider.

## 2014-05-26 NOTE — ED Notes (Signed)
Bed: WA17 Expected date:  Expected time:  Means of arrival:  Comments: EMS- elderly fall, possible hypoglycemia

## 2014-05-26 NOTE — ED Notes (Signed)
Informed the pt a urine specimen is needed.

## 2014-05-30 DIAGNOSIS — I129 Hypertensive chronic kidney disease with stage 1 through stage 4 chronic kidney disease, or unspecified chronic kidney disease: Secondary | ICD-10-CM | POA: Diagnosis not present

## 2014-05-30 DIAGNOSIS — I82401 Acute embolism and thrombosis of unspecified deep veins of right lower extremity: Secondary | ICD-10-CM | POA: Diagnosis not present

## 2014-05-30 DIAGNOSIS — E1122 Type 2 diabetes mellitus with diabetic chronic kidney disease: Secondary | ICD-10-CM | POA: Diagnosis not present

## 2014-05-30 DIAGNOSIS — N183 Chronic kidney disease, stage 3 (moderate): Secondary | ICD-10-CM | POA: Diagnosis not present

## 2014-06-07 DIAGNOSIS — M47816 Spondylosis without myelopathy or radiculopathy, lumbar region: Secondary | ICD-10-CM | POA: Diagnosis not present

## 2014-06-07 DIAGNOSIS — M545 Low back pain: Secondary | ICD-10-CM | POA: Diagnosis not present

## 2014-06-14 DIAGNOSIS — E119 Type 2 diabetes mellitus without complications: Secondary | ICD-10-CM | POA: Diagnosis not present

## 2014-06-14 DIAGNOSIS — H3532 Exudative age-related macular degeneration: Secondary | ICD-10-CM | POA: Diagnosis not present

## 2014-06-14 DIAGNOSIS — Z961 Presence of intraocular lens: Secondary | ICD-10-CM | POA: Diagnosis not present

## 2014-06-27 ENCOUNTER — Ambulatory Visit: Payer: Medicare Other | Admitting: Internal Medicine

## 2014-07-17 ENCOUNTER — Telehealth: Payer: Self-pay | Admitting: Internal Medicine

## 2014-07-17 MED ORDER — DOXYCYCLINE HYCLATE 100 MG PO TABS: ORAL_TABLET | ORAL | Status: DC

## 2014-07-17 MED ORDER — BENZONATATE 100 MG PO CAPS: 100.0000 mg | ORAL_CAPSULE | Freq: Four times a day (QID) | ORAL | Status: DC | PRN

## 2014-07-17 NOTE — Telephone Encounter (Signed)
Offer doxycycline 100 mg, # 8, 2 today then one daily            Benzonatate 100 mg, 3 30, 1 or 2 every 6 hours if needed for cough

## 2014-07-17 NOTE — Telephone Encounter (Signed)
Spoke with pt's wife.  Reports symptoms started 2-3 days ago with sinus drainage and cough.  Now pt has cough with dark yellow mucus and sore throat.  Cough worse last night.  No increased SOB, chest tightness, CP, or f/c/s.  Pt has not tried anything for symptoms.  Requesting CY's recs and requesting "cough pill"  Dr. Annamaria Boots, pls advise. Thank you.  Last OV with CY: 12/22/13; f/u 6 months  Pending OV with CY: 10/08/14  [facility] [address]  Allergies  Allergen Reactions  . Colchicine Anaphylaxis  . Atorvastatin Other (See Comments)    Joint soreness  . Ceftin Diarrhea  . Codeine Hives  . Erythromycin Diarrhea  . Ezetimibe Other (See Comments)    Joint soreness  . Hibiclens [Chlorhexidine] Hives    Cannot use must use ivory soap  . Nitroglycerin Other (See Comments)    lowers blood pressure   . Oxycodone-Acetaminophen Hives  . Rosuvastatin Other (See Comments)    Joint soreness  . Simvastatin Other (See Comments)    Joint soreness  . Vantin Other (See Comments)    Doesn't remember  . Contrast Media [Iodinated Diagnostic Agents] Nausea And Vomiting and Rash  . Tetanus Toxoid Rash      Current Outpatient Prescriptions on File Prior to Visit  Medication Sig Dispense Refill  . acetaminophen (TYLENOL) 500 MG tablet Take 1,000 mg by mouth every 6 (six) hours as needed for mild pain or moderate pain.    . Cholecalciferol (VITAMIN D) 2000 UNITS tablet Take 2,000 Units by mouth daily.    . colesevelam (WELCHOL) 625 MG tablet Take 1,250 mg by mouth 3 (three) times daily.    . COMBIGAN 0.2-0.5 % ophthalmic solution Place 1 drop into both eyes every 12 (twelve) hours.   0  . DIGITEK 125 MCG tablet Take 1 tablet by mouth daily.  0  . diltiazem (CARDIZEM CD) 240 MG 24 hr capsule Take 240 mg by mouth every evening.    . docusate sodium (COLACE) 100 MG capsule Take 100 mg by mouth daily as needed for mild constipation.    . furosemide (LASIX) 20 MG tablet Take 20 mg by mouth every morning.      Marland Kitchen glucose 4 GM chewable tablet Chew 1-2 tablets by mouth as needed for low blood sugar.    . insulin glargine (LANTUS) 100 UNIT/ML injection Inject 10 Units into the skin at bedtime. Takes 10 units if CBG is 250; at 150 takes 5-6 units    . insulin regular (NOVOLIN R,HUMULIN R) 100 units/mL injection Inject 5-7 Units into the skin 3 (three) times daily before meals. 5-7 units according to sliding scale    . ipratropium (ATROVENT) 0.03 % nasal spray Place 2 sprays into both nostrils every 12 (twelve) hours. (Patient not taking: Reported on 05/26/2014) 30 mL 12  . levothyroxine (SYNTHROID) 200 MCG tablet Take 200 mcg by mouth daily before breakfast. Take with 50 mcg tablet    . levothyroxine (SYNTHROID, LEVOTHROID) 50 MCG tablet Take 50 mcg by mouth daily before breakfast.    . Multiple Vitamins-Minerals (OCUVITE EXTRA PO) Take 1 tablet by mouth daily.     Marland Kitchen omeprazole (PRILOSEC) 20 MG capsule Take 20 mg by mouth daily.    . polyethylene glycol (MIRALAX / GLYCOLAX) packet Take 17 g by mouth daily.    Marland Kitchen warfarin (COUMADIN) 1 MG tablet Take 1 tablet by mouth as directed. Take  with a 5mg  tablet for a total of 6mg  on every  third day  0  . warfarin (COUMADIN) 5 MG tablet Take 5 mg by mouth daily. Takes 5mg  for 2days, then 6mg  for 1day; then repeat  (Example: Monday, Tuesday, take 5mg , Wednesday take 6mg , Thursday, Friday take 5mg , Saturday take 6mg , etc..)    . zolpidem (AMBIEN) 5 MG tablet Take 1 tablet (5 mg total) by mouth at bedtime as needed for sleep. 30 tablet 5   No current facility-administered medications on file prior to visit.

## 2014-07-17 NOTE — Telephone Encounter (Signed)
Called and spoke to pt's wife, [name]. Informed her of the recs per CY. [name] stated pt is no longer on Coumadin (as this was a possible drug-drug interaction with doxy). [name] stated pt has been off the Coumadin for several weeks. Rx sent in to preferred pharmacy. [name] verbalized understanding and denied any further questions or concerns at this time.

## 2014-07-23 DIAGNOSIS — Z79899 Other long term (current) drug therapy: Secondary | ICD-10-CM | POA: Diagnosis not present

## 2014-07-23 DIAGNOSIS — E1122 Type 2 diabetes mellitus with diabetic chronic kidney disease: Secondary | ICD-10-CM | POA: Diagnosis not present

## 2014-07-23 DIAGNOSIS — N183 Chronic kidney disease, stage 3 (moderate): Secondary | ICD-10-CM | POA: Diagnosis not present

## 2014-07-23 DIAGNOSIS — Z794 Long term (current) use of insulin: Secondary | ICD-10-CM | POA: Diagnosis not present

## 2014-07-23 DIAGNOSIS — I129 Hypertensive chronic kidney disease with stage 1 through stage 4 chronic kidney disease, or unspecified chronic kidney disease: Secondary | ICD-10-CM | POA: Diagnosis not present

## 2014-07-24 ENCOUNTER — Telehealth: Payer: Self-pay | Admitting: Internal Medicine

## 2014-07-24 MED ORDER — BENZONATATE 100 MG PO CAPS: 100.0000 mg | ORAL_CAPSULE | Freq: Four times a day (QID) | ORAL | Status: DC | PRN

## 2014-07-24 NOTE — Telephone Encounter (Signed)
Per SN-okay for patient to use OTC Deslym 2 tsp BID, Tessalon pearls 100mg  #50 1-2 po every 6 hours prn cough and OV with CY next week (08-03-14 at 11:30am). Thanks.

## 2014-07-24 NOTE — Telephone Encounter (Signed)
Allergies  Allergen Reactions  . Colchicine Anaphylaxis  . Atorvastatin Other (See Comments)    Joint soreness  . Ceftin Diarrhea  . Codeine Hives  . Erythromycin Diarrhea  . Ezetimibe Other (See Comments)    Joint soreness  . Hibiclens [Chlorhexidine] Hives    Cannot use must use ivory soap  . Nitroglycerin Other (See Comments)    lowers blood pressure   . Oxycodone-Acetaminophen Hives  . Rosuvastatin Other (See Comments)    Joint soreness  . Simvastatin Other (See Comments)    Joint soreness  . Vantin Other (See Comments)    Doesn't remember  . Contrast Media [Iodinated Diagnostic Agents] Nausea And Vomiting and Rash  . Tetanus Toxoid Rash    Pt states he has a dry cough and stuffy nose. He was given Doxycycline for 7 days on 07/17/14 by CY for drainage and productive cough. Pt is asking what should he do or does he need to come in for an appt for this.   SN please advise.

## 2014-07-24 NOTE — Telephone Encounter (Signed)
Called spoke with spouse and is aware of recs. rx sent in for tess pearls and appt scheduled. Nothing further needed

## 2014-07-25 ENCOUNTER — Telehealth: Payer: Self-pay | Admitting: Internal Medicine

## 2014-07-25 NOTE — Telephone Encounter (Signed)
Pt is needing refill on ambien. Last refilled 12/22/13 #30 x 5 refills Last OV 12/22/13 Pending 08/02/13 appt. Pt has about 4-5 days left of medication Please advise Dr. Annamaria Boots on refill thaanks

## 2014-07-26 MED ORDER — ZOLPIDEM TARTRATE 5 MG PO TABS: 5.0000 mg | ORAL_TABLET | Freq: Every evening | ORAL | Status: DC | PRN

## 2014-07-26 NOTE — Telephone Encounter (Signed)
Rx has been called in. Pt is aware. Nothing further was needed. 

## 2014-07-26 NOTE — Telephone Encounter (Signed)
Ok refill ambien x 6 months

## 2014-08-02 DIAGNOSIS — H3532 Exudative age-related macular degeneration: Secondary | ICD-10-CM | POA: Diagnosis not present

## 2014-08-03 ENCOUNTER — Ambulatory Visit (INDEPENDENT_AMBULATORY_CARE_PROVIDER_SITE_OTHER): Payer: Medicare Other | Admitting: Internal Medicine

## 2014-08-03 ENCOUNTER — Encounter: Payer: Self-pay | Admitting: Internal Medicine

## 2014-08-03 VITALS — BP 102/60 | HR 74 | Ht 71.0 in | Wt 162.4 lb

## 2014-08-03 DIAGNOSIS — J208 Acute bronchitis due to other specified organisms: Secondary | ICD-10-CM | POA: Diagnosis not present

## 2014-08-03 DIAGNOSIS — J310 Chronic rhinitis: Secondary | ICD-10-CM | POA: Diagnosis not present

## 2014-08-03 DIAGNOSIS — J209 Acute bronchitis, unspecified: Secondary | ICD-10-CM

## 2014-08-03 MED ORDER — METHYLPREDNISOLONE ACETATE 80 MG/ML IJ SUSP
80.0000 mg | Freq: Once | INTRAMUSCULAR | Status: AC
  Administered 2014-08-03: 80 mg via INTRAMUSCULAR

## 2014-08-03 NOTE — Assessment & Plan Note (Signed)
History of recurrent acute bronchitis or possibly a mild chronic bronchitis with occasional acute exacerbation. I explained why we would like to hold off on antibiotics to see if he really needs it. Steroid talk done again. He accepts impact on blood sugar temporarily. Plan-Depo-Medrol

## 2014-08-03 NOTE — Assessment & Plan Note (Signed)
He usually has some awareness of nasal stuffiness and postnasal drainage. Depo-Medrol today may be helpful at least temporarily. We have talked again about use of nasal saline.

## 2014-08-03 NOTE — Patient Instructions (Signed)
Depo 80       Please call as needed

## 2014-08-03 NOTE — Progress Notes (Signed)
Patient ID: [name], male    DOB: Jan 17, 1931, 81 y.o.   MRN: [id number]  HPI Last here Decmber 6, 2011. Wive here now. He is going to [place] for second opinion about ? Parkinsons.  He notices that he coughs more, with some sputum, if he lies on right side. No recent antibiotics.  Increased watery nose with Spring pollen.  CXR- 12/25/09- clear, NAD.  Asks refill generic ambien for occasional nonspecific insomnia.   11/17/10- 78 yoM never smoker followed for recurrent bronchitis, complicated by hx urticaria, PAFIB, DM. Wife here Has had flu vaccine. He had a recent upper respiratory infection with watery rhinorrhea headache and malaise because of which she spent a few days in the house and in bed. He took Benadryl and Tylenol. That illness is largely resolved. He still has nasal congestion and some cough producing clear mucus. He has not been needing his rescue inhaler. He asks for a Depo-Medrol injection and we discussed his blood sugar status. He likes benzonatate for cough.  01/14/11- 78 yoM never smoker followed for recurrent bronchitis, complicated by hx urticaria, PAFIB, DM, Chronic insomnia.   Wife here Acute visit-cough; productive-white and thick, sinus drainage, wheezing as well-worse at night Has had flu vaccine. Went to [facility] emergency room on Christmas Eve with malaise. He was treated with continuous nebulizer, intravenous steroids and prednisone taper. The next day he had some fever which is resolved. He complains of persistent postnasal drip, dry cough and malaise. With hard coughing he has had some sharp left lateral chest wall pain. Sleeping sitting up. His raspy breathing noise worries him. He is taking Delsym, Mucinex D. and says antibiotics have not helped. Still on prednisone. For his problem of chronic insomnia-complains he can't sleep. Waiting for prior authorization to renew his Ambien and can't sleep without it.  03/02/11-  78 yoM never smoker followed for  recurrent bronchitis, complicated by hx urticaria, PAFIB, DM, Chronic insomnia.  C/O cough getting worse-night time; unable to sleep; ENT 3 weeks ago-was told to use salt water mixture in nose. He has been coughing for 6 weeks. His ENT doctor told him to take Prilosec twice daily and that did seem to help initially. In the last 2 weeks cough is worse again, productive of scant white sputum. Taking benzonatate. Much rhinorrhea. Denies any reflux or choking with food but then admits he swallows bread after pills to make them go down.  05/30/12-  80 yoM never smoker followed for recurrent bronchitis, complicated by hx urticaria, PAFIB, DM, Chronic insomnia.  Wife here FOLLOWS FOR: sinus drainage and slight cough at times; Denies any wheezing or  SOB. Scheduled f/u without acute concerns. No urticaria. Some pndrip and throat clearing. Little cough and no wheeze. No concerns about meds or pollen season.  04/24/13- 80 yoM never smoker followed for recurrent bronchitis, complicated by hx urticaria, PAFIB, DM, Chronic insomnia.  Wife here FOLLOWS FOR: acute visit. C/o increase in cough and unable to produce mucus with cough. C/o chest soreness from coughing  and SOB with activity.  2-3 weeks much dry cough and wheeze when lying down. Rescue inhaler not enough help. Postnasal drip. Prednisone +/- help.  12/22/13- 80 yoM never smoker followed for recurrent bronchitis, complicated by hx urticaria, PAFIB, DM, Chronic insomnia.  Wife here FOLLOWS FOR: coughs up thick/clear phelgm after eating; runny nose as well. Pt states his breathing is doing fine.   Dymista sample was not enough help. Nasal congestion and watery nose  are triggered by exposure to cold such as cold drink or food. He asks refill Ambien for his chronic insomnia. He has been tolerating this well without confusion or recognized problems. We discussed safety again.  08/03/14- 80 yoM never smoker followed for recurrent bronchitis, complicated by hx  urticaria, PAFIB, DM, Chronic insomnia.  Wife here Follows for: Still increased cough with grey mucus. Denies any wheezing or SOB.  An acute bronchitis responded to antibiotics but left him with residual postnasal drip and cough productive of small amounts of clear sputum. No fever or wheezing. He has used Mucinex DM. Asks for Depo-Medrol. We discussed steroid effect on his diabetes. He feels this has been the most successful approach for stopping his persistent bronchitis. Also has some persistent nasal stuffiness partly related to known septal deviation. CXR 05/26/14- FINDINGS: Lungs are adequately inflated without focal consolidation or effusion. There is no pneumothorax. Cardiomediastinal silhouette is within normal. There is minimal calcification over the aortic arch. There are degenerative changes of the spine. Chronic stable deformity of the left seventh rib. IMPRESSION: No active cardiopulmonary disease. Electronically Signed  By: Marin Olp M.D.  On: 05/26/2014 21:21  Review of Systems-see HPI Constitutional:   No-   weight loss, night sweats, fevers, chills, fatigue, lassitude. HEENT:   No-  headaches, difficulty swallowing, tooth/dental problems, sore throat,       No-  sneezing, itching, ear ache, +nasal congestion, +post nasal drip,  CV:  No-   chest pain, orthopnea, PND, swelling in lower extremities, anasarca, or dizziness, palpitations Resp: +   shortness of breath with exertion or at rest.            + productive cough,  No non-productive cough,  No- coughing up of blood.              No-   change in color of mucus.  No- wheezing.   Skin: No-   rash or lesions. GI:  No-   heartburn, indigestion, abdominal pain, nausea, vomiting, GU:  MS:  No-   joint pain or swelling.    . Neuro-     nothing unusual Psych:  No- change in mood or affect. No depression or anxiety.  No memory loss.  Objective:   Physical Exam General- Alert, Oriented, Affect-appropriate, Distress-  none acute Skin- rash-none, lesions- none, excoriation- none Lymphadenopathy- none Head- atraumatic            Eyes- Gross vision intact, PERRLA, conjunctivae clear secretions            Ears- Hearing aid, HOH            Nose- pale and watery, Septal dev +, mucus, polyps, erosion, perforation             Throat- Mallampati II-III  , mucous+ clear , drainage- none, tonsils- atrophic Neck- flexible , trachea midline, no stridor , thyroid nl, carotid no bruit Chest - symmetrical excursion , unlabored           Heart/CV- RRR , no murmur , no gallop  , no rub, nl s1 s2                           - JVD- none , edema- none, stasis changes- none, varices- none           Lung- clear unlabored, cough+raspy , dullness-none, rub- none           Chest wall- Abd- Br/ Gen/  Rectal- Not done, not indicated Extrem- cyanosis- none, clubbing, none, atrophy- none, strength- nl Neuro- grossly intact to observation, hesitant speech

## 2014-08-06 ENCOUNTER — Telehealth: Payer: Self-pay | Admitting: Internal Medicine

## 2014-08-06 NOTE — Telephone Encounter (Signed)
Patients wife says that patient has already taken Doxy once a day for over a week and it didn't help, she said that she had the same symptoms and saw Dr. Melvyn Novas and Dr. Melvyn Novas gave her Doxy 2 times a day and it made her better.  She wants to know if patient can take the medication 2 times a day instead of 1 daily, she said that she doesn't think that 1 daily will be enough.  CY - please advise.

## 2014-08-06 NOTE — Telephone Encounter (Signed)
lmomtcb x1 

## 2014-08-06 NOTE — Telephone Encounter (Signed)
Offer doxycycline 100 mg, # 8, 2 today then one daily 

## 2014-08-06 NOTE — Telephone Encounter (Signed)
Pt wife returning call and can be reached @ [phone] and it iis ok to leave msg if you get no answer.[name]

## 2014-08-06 NOTE — Telephone Encounter (Signed)
Please try on 7/26 to return call with Rx for doxy.

## 2014-08-06 NOTE — Telephone Encounter (Signed)
Pt c/o prod cough (light colored grey phlem), hoarseness. No wheezing, no chest tx, no f/c/s/n/v.  Pt is wanting ABX called in. Please advise Dr. Annamaria Boots thanks  Allergies  Allergen Reactions  . Colchicine Anaphylaxis  . Atorvastatin Other (See Comments)    Joint soreness  . Ceftin Diarrhea  . Codeine Hives  . Erythromycin Diarrhea  . Ezetimibe Other (See Comments)    Joint soreness  . Hibiclens [Chlorhexidine] Hives    Cannot use must use ivory soap  . Nitroglycerin Other (See Comments)    lowers blood pressure   . Oxycodone-Acetaminophen Hives  . Rosuvastatin Other (See Comments)    Joint soreness  . Simvastatin Other (See Comments)    Joint soreness  . Vantin Other (See Comments)    Doesn't remember  . Contrast Media [Iodinated Diagnostic Agents] Nausea And Vomiting and Rash  . Tetanus Toxoid Rash     Current Outpatient Prescriptions on File Prior to Visit  Medication Sig Dispense Refill  . acetaminophen (TYLENOL) 500 MG tablet Take 1,000 mg by mouth every 6 (six) hours as needed for mild pain or moderate pain.    . benzonatate (TESSALON) 100 MG capsule Take 1-2 capsules (100-200 mg total) by mouth every 6 (six) hours as needed for cough. 50 capsule 0  . Cholecalciferol (VITAMIN D) 2000 UNITS tablet Take 2,000 Units by mouth daily.    . colesevelam (WELCHOL) 625 MG tablet Take 1,250 mg by mouth 3 (three) times daily.    . COMBIGAN 0.2-0.5 % ophthalmic solution Place 1 drop into both eyes every 12 (twelve) hours.   0  . DIGITEK 125 MCG tablet Take 1 tablet by mouth daily.  0  . diltiazem (CARDIZEM CD) 240 MG 24 hr capsule Take 240 mg by mouth every evening.    . docusate sodium (COLACE) 100 MG capsule Take 100 mg by mouth daily as needed for mild constipation.    . furosemide (LASIX) 20 MG tablet Take 20 mg by mouth every morning.     Marland Kitchen glucose 4 GM chewable tablet Chew 1-2 tablets by mouth as needed for low blood sugar.    . insulin glargine (LANTUS) 100 UNIT/ML injection  Inject 10 Units into the skin at bedtime. Takes 10 units if CBG is 250; at 150 takes 5-6 units    . insulin regular (NOVOLIN R,HUMULIN R) 100 units/mL injection Inject 5-7 Units into the skin 3 (three) times daily before meals. 5-7 units according to sliding scale    . levothyroxine (SYNTHROID) 200 MCG tablet Take 200 mcg by mouth daily before breakfast. Take with 50 mcg tablet    . levothyroxine (SYNTHROID, LEVOTHROID) 50 MCG tablet Take 50 mcg by mouth daily before breakfast.    . Multiple Vitamins-Minerals (OCUVITE EXTRA PO) Take 1 tablet by mouth daily.     Marland Kitchen omeprazole (PRILOSEC) 20 MG capsule Take 20 mg by mouth daily.    . polyethylene glycol (MIRALAX / GLYCOLAX) packet Take 17 g by mouth daily.    Marland Kitchen zolpidem (AMBIEN) 5 MG tablet Take 1 tablet (5 mg total) by mouth at bedtime as needed for sleep. 30 tablet 5   No current facility-administered medications on file prior to visit.

## 2014-08-06 NOTE — Telephone Encounter (Signed)
Attempted to call patient back, phone rang, someone picked up and hung up phone.  wcb

## 2014-08-07 MED ORDER — DOXYCYCLINE HYCLATE 100 MG PO TABS: 100.0000 mg | ORAL_TABLET | Freq: Two times a day (BID) | ORAL | Status: DC

## 2014-08-07 NOTE — Telephone Encounter (Signed)
Ok doxycycline 100 mg, # 20   1 twice daily

## 2014-08-07 NOTE — Telephone Encounter (Signed)
Pt spouse is requesting doxy RX to be BID instead of take 2 today then 1 daily. Please advise Dr. Annamaria Boots thanks

## 2014-08-07 NOTE — Telephone Encounter (Signed)
Pt aware of Rx being sent to pharmacy.   Nothing further needed.

## 2014-09-13 DIAGNOSIS — H3532 Exudative age-related macular degeneration: Secondary | ICD-10-CM | POA: Diagnosis not present

## 2014-10-08 ENCOUNTER — Ambulatory Visit: Payer: Medicare Other | Admitting: Internal Medicine

## 2014-10-14 ENCOUNTER — Inpatient Hospital Stay (HOSPITAL_COMMUNITY): Payer: Medicare Other

## 2014-10-14 ENCOUNTER — Encounter (HOSPITAL_COMMUNITY): Payer: Self-pay | Admitting: Emergency Medicine

## 2014-10-14 ENCOUNTER — Inpatient Hospital Stay (HOSPITAL_COMMUNITY)
Admission: EM | Admit: 2014-10-14 | Discharge: 2014-10-15 | DRG: 069 | Disposition: A | Discharge status: Home Health | Payer: Medicare Other | Attending: Internal Medicine | Admitting: Internal Medicine

## 2014-10-14 ENCOUNTER — Emergency Department (HOSPITAL_COMMUNITY): Payer: Medicare Other

## 2014-10-14 DIAGNOSIS — I6322 Cerebral infarction due to unspecified occlusion or stenosis of basilar arteries: Secondary | ICD-10-CM | POA: Diagnosis not present

## 2014-10-14 DIAGNOSIS — S46221A Laceration of muscle, fascia and tendon of other parts of biceps, right arm, initial encounter: Secondary | ICD-10-CM | POA: Diagnosis not present

## 2014-10-14 DIAGNOSIS — Z8249 Family history of ischemic heart disease and other diseases of the circulatory system: Secondary | ICD-10-CM

## 2014-10-14 DIAGNOSIS — K219 Gastro-esophageal reflux disease without esophagitis: Secondary | ICD-10-CM | POA: Diagnosis present

## 2014-10-14 DIAGNOSIS — R471 Dysarthria and anarthria: Secondary | ICD-10-CM | POA: Diagnosis not present

## 2014-10-14 DIAGNOSIS — Z8673 Personal history of transient ischemic attack (TIA), and cerebral infarction without residual deficits: Secondary | ICD-10-CM | POA: Insufficient documentation

## 2014-10-14 DIAGNOSIS — J449 Chronic obstructive pulmonary disease, unspecified: Secondary | ICD-10-CM | POA: Diagnosis not present

## 2014-10-14 DIAGNOSIS — M1990 Unspecified osteoarthritis, unspecified site: Secondary | ICD-10-CM | POA: Diagnosis present

## 2014-10-14 DIAGNOSIS — Z96653 Presence of artificial knee joint, bilateral: Secondary | ICD-10-CM | POA: Diagnosis present

## 2014-10-14 DIAGNOSIS — E119 Type 2 diabetes mellitus without complications: Secondary | ICD-10-CM | POA: Diagnosis not present

## 2014-10-14 DIAGNOSIS — Z8546 Personal history of malignant neoplasm of prostate: Secondary | ICD-10-CM

## 2014-10-14 DIAGNOSIS — I10 Essential (primary) hypertension: Secondary | ICD-10-CM | POA: Diagnosis not present

## 2014-10-14 DIAGNOSIS — G458 Other transient cerebral ischemic attacks and related syndromes: Secondary | ICD-10-CM

## 2014-10-14 DIAGNOSIS — H3530 Unspecified macular degeneration: Secondary | ICD-10-CM | POA: Diagnosis present

## 2014-10-14 DIAGNOSIS — E890 Postprocedural hypothyroidism: Secondary | ICD-10-CM | POA: Diagnosis present

## 2014-10-14 DIAGNOSIS — Z794 Long term (current) use of insulin: Secondary | ICD-10-CM | POA: Diagnosis not present

## 2014-10-14 DIAGNOSIS — Z87442 Personal history of urinary calculi: Secondary | ICD-10-CM

## 2014-10-14 DIAGNOSIS — I639 Cerebral infarction, unspecified: Secondary | ICD-10-CM | POA: Insufficient documentation

## 2014-10-14 DIAGNOSIS — H9190 Unspecified hearing loss, unspecified ear: Secondary | ICD-10-CM | POA: Diagnosis present

## 2014-10-14 DIAGNOSIS — C61 Malignant neoplasm of prostate: Secondary | ICD-10-CM | POA: Diagnosis not present

## 2014-10-14 DIAGNOSIS — G459 Transient cerebral ischemic attack, unspecified: Secondary | ICD-10-CM | POA: Diagnosis not present

## 2014-10-14 DIAGNOSIS — K5909 Other constipation: Secondary | ICD-10-CM | POA: Diagnosis present

## 2014-10-14 DIAGNOSIS — K5900 Constipation, unspecified: Secondary | ICD-10-CM | POA: Diagnosis not present

## 2014-10-14 DIAGNOSIS — Z823 Family history of stroke: Secondary | ICD-10-CM | POA: Diagnosis not present

## 2014-10-14 DIAGNOSIS — R4781 Slurred speech: Secondary | ICD-10-CM | POA: Diagnosis not present

## 2014-10-14 DIAGNOSIS — R7989 Other specified abnormal findings of blood chemistry: Secondary | ICD-10-CM | POA: Diagnosis present

## 2014-10-14 DIAGNOSIS — Z85828 Personal history of other malignant neoplasm of skin: Secondary | ICD-10-CM

## 2014-10-14 DIAGNOSIS — I6789 Other cerebrovascular disease: Secondary | ICD-10-CM | POA: Diagnosis not present

## 2014-10-14 DIAGNOSIS — Z86718 Personal history of other venous thrombosis and embolism: Secondary | ICD-10-CM

## 2014-10-14 DIAGNOSIS — R29810 Facial weakness: Secondary | ICD-10-CM

## 2014-10-14 DIAGNOSIS — I480 Paroxysmal atrial fibrillation: Secondary | ICD-10-CM | POA: Diagnosis not present

## 2014-10-14 DIAGNOSIS — I6621 Occlusion and stenosis of right posterior cerebral artery: Secondary | ICD-10-CM | POA: Diagnosis not present

## 2014-10-14 DIAGNOSIS — E039 Hypothyroidism, unspecified: Secondary | ICD-10-CM | POA: Diagnosis present

## 2014-10-14 DIAGNOSIS — E038 Other specified hypothyroidism: Secondary | ICD-10-CM

## 2014-10-14 DIAGNOSIS — E785 Hyperlipidemia, unspecified: Secondary | ICD-10-CM | POA: Diagnosis present

## 2014-10-14 LAB — COMPREHENSIVE METABOLIC PANEL
ALT: 11 U/L — AB (ref 17–63)
ANION GAP: 10 (ref 5–15)
AST: 16 U/L (ref 15–41)
Albumin: 3.8 g/dL (ref 3.5–5.0)
Alkaline Phosphatase: 58 U/L (ref 38–126)
BUN: 19 mg/dL (ref 6–20)
CHLORIDE: 101 mmol/L (ref 101–111)
CO2: 26 mmol/L (ref 22–32)
Calcium: 8.9 mg/dL (ref 8.9–10.3)
Creatinine, Ser: 1.07 mg/dL (ref 0.61–1.24)
Glucose, Bld: 114 mg/dL — ABNORMAL HIGH (ref 65–99)
POTASSIUM: 3.8 mmol/L (ref 3.5–5.1)
SODIUM: 137 mmol/L (ref 135–145)
Total Bilirubin: 0.9 mg/dL (ref 0.3–1.2)
Total Protein: 6.1 g/dL — ABNORMAL LOW (ref 6.5–8.1)

## 2014-10-14 LAB — CBC
HCT: 43.1 % (ref 39.0–52.0)
HEMOGLOBIN: 14.2 g/dL (ref 13.0–17.0)
MCH: 30 pg (ref 26.0–34.0)
MCHC: 32.9 g/dL (ref 30.0–36.0)
MCV: 91.1 fL (ref 78.0–100.0)
PLATELETS: 209 10*3/uL (ref 150–400)
RBC: 4.73 MIL/uL (ref 4.22–5.81)
RDW: 12.5 % (ref 11.5–15.5)
WBC: 8.5 10*3/uL (ref 4.0–10.5)

## 2014-10-14 LAB — DIFFERENTIAL
BASOS ABS: 0 10*3/uL (ref 0.0–0.1)
BASOS PCT: 0 %
EOS ABS: 0.1 10*3/uL (ref 0.0–0.7)
Eosinophils Relative: 1 %
LYMPHS ABS: 1.1 10*3/uL (ref 0.7–4.0)
Lymphocytes Relative: 13 %
MONO ABS: 0.8 10*3/uL (ref 0.1–1.0)
MONOS PCT: 9 %
NEUTROS ABS: 6.5 10*3/uL (ref 1.7–7.7)
Neutrophils Relative %: 77 %

## 2014-10-14 LAB — GLUCOSE, CAPILLARY
Glucose-Capillary: 262 mg/dL — ABNORMAL HIGH (ref 65–99)
Glucose-Capillary: 123 mg/dL — ABNORMAL HIGH (ref 65–99)

## 2014-10-14 LAB — I-STAT CHEM 8, ED
BUN: 22 mg/dL — ABNORMAL HIGH (ref 6–20)
CALCIUM ION: 1.15 mmol/L (ref 1.13–1.30)
CHLORIDE: 104 mmol/L (ref 101–111)
Creatinine, Ser: 1.2 mg/dL (ref 0.61–1.24)
GLUCOSE: 106 mg/dL — AB (ref 65–99)
HCT: 44 % (ref 39.0–52.0)
Hemoglobin: 15 g/dL (ref 13.0–17.0)
POTASSIUM: 3.7 mmol/L (ref 3.5–5.1)
Sodium: 140 mmol/L (ref 135–145)
TCO2: 25 mmol/L (ref 0–100)

## 2014-10-14 LAB — CBG MONITORING, ED
GLUCOSE-CAPILLARY: 127 mg/dL — AB (ref 65–99)
Glucose-Capillary: 97 mg/dL (ref 65–99)

## 2014-10-14 LAB — I-STAT TROPONIN, ED: TROPONIN I, POC: 0 ng/mL (ref 0.00–0.08)

## 2014-10-14 LAB — APTT: aPTT: 28 seconds (ref 24–37)

## 2014-10-14 LAB — PROTIME-INR
INR: 1.05 (ref 0.00–1.49)
PROTHROMBIN TIME: 13.9 s (ref 11.6–15.2)

## 2014-10-14 LAB — TSH: TSH: 0.129 u[IU]/mL — ABNORMAL LOW (ref 0.350–4.500)

## 2014-10-14 MED ORDER — TIMOLOL MALEATE 0.5 % OP SOLN
1.0000 [drp] | Freq: Two times a day (BID) | OPHTHALMIC | Status: DC
  Administered 2014-10-14 – 2014-10-15 (×2): 1 [drp] via OPHTHALMIC
  Filled 2014-10-14: qty 5

## 2014-10-14 MED ORDER — DOCUSATE SODIUM 100 MG PO CAPS: 100.0000 mg | ORAL_CAPSULE | Freq: Every day | ORAL | Status: DC | PRN

## 2014-10-14 MED ORDER — INSULIN GLARGINE 100 UNIT/ML ~~LOC~~ SOLN
10.0000 [IU] | Freq: Every day | SUBCUTANEOUS | Status: DC
  Administered 2014-10-14: 10 [IU] via SUBCUTANEOUS
  Filled 2014-10-14 (×3): qty 0.1

## 2014-10-14 MED ORDER — INSULIN ASPART 100 UNIT/ML ~~LOC~~ SOLN
0.0000 [IU] | Freq: Three times a day (TID) | SUBCUTANEOUS | Status: DC
  Administered 2014-10-14: 5 [IU] via SUBCUTANEOUS
  Administered 2014-10-15: 1 [IU] via SUBCUTANEOUS

## 2014-10-14 MED ORDER — ASPIRIN EC 325 MG PO TBEC
325.0000 mg | DELAYED_RELEASE_TABLET | Freq: Every day | ORAL | Status: DC
  Administered 2014-10-14 – 2014-10-15 (×2): 325 mg via ORAL
  Filled 2014-10-14 (×2): qty 1

## 2014-10-14 MED ORDER — ENOXAPARIN SODIUM 40 MG/0.4ML ~~LOC~~ SOLN
40.0000 mg | SUBCUTANEOUS | Status: DC
  Administered 2014-10-14 – 2014-10-15 (×2): 40 mg via SUBCUTANEOUS
  Filled 2014-10-14 (×2): qty 0.4

## 2014-10-14 MED ORDER — LEVOTHYROXINE SODIUM 200 MCG PO TABS
200.0000 ug | ORAL_TABLET | Freq: Every day | ORAL | Status: DC
  Administered 2014-10-15: 200 ug via ORAL
  Filled 2014-10-14: qty 2
  Filled 2014-10-14 (×2): qty 1
  Filled 2014-10-14: qty 2

## 2014-10-14 MED ORDER — PANTOPRAZOLE SODIUM 40 MG PO TBEC
40.0000 mg | DELAYED_RELEASE_TABLET | Freq: Every day | ORAL | Status: DC
  Administered 2014-10-14 – 2014-10-15 (×2): 40 mg via ORAL
  Filled 2014-10-14 (×2): qty 1

## 2014-10-14 MED ORDER — COLESEVELAM HCL 625 MG PO TABS
1250.0000 mg | ORAL_TABLET | Freq: Three times a day (TID) | ORAL | Status: DC
  Administered 2014-10-14 – 2014-10-15 (×4): 1250 mg via ORAL
  Filled 2014-10-14 (×6): qty 2

## 2014-10-14 MED ORDER — ACETAMINOPHEN 650 MG RE SUPP: 650.0000 mg | RECTAL | Status: DC | PRN

## 2014-10-14 MED ORDER — ZOLPIDEM TARTRATE 5 MG PO TABS: 5.0000 mg | ORAL_TABLET | Freq: Every evening | ORAL | Status: DC | PRN

## 2014-10-14 MED ORDER — LEVOTHYROXINE SODIUM 50 MCG PO TABS
50.0000 ug | ORAL_TABLET | Freq: Every day | ORAL | Status: DC
  Administered 2014-10-15: 50 ug via ORAL
  Filled 2014-10-14: qty 1

## 2014-10-14 MED ORDER — POLYETHYLENE GLYCOL 3350 17 G PO PACK
17.0000 g | PACK | Freq: Every day | ORAL | Status: DC
  Administered 2014-10-14 – 2014-10-15 (×2): 17 g via ORAL
  Filled 2014-10-14 (×2): qty 1

## 2014-10-14 MED ORDER — ACETAMINOPHEN 325 MG PO TABS
650.0000 mg | ORAL_TABLET | ORAL | Status: DC | PRN
  Administered 2014-10-14 (×2): 650 mg via ORAL
  Filled 2014-10-14 (×2): qty 2

## 2014-10-14 MED ORDER — DIGOXIN 125 MCG PO TABS
125.0000 ug | ORAL_TABLET | Freq: Every day | ORAL | Status: DC
  Administered 2014-10-14 – 2014-10-15 (×2): 125 ug via ORAL
  Filled 2014-10-14 (×2): qty 1

## 2014-10-14 MED ORDER — BRIMONIDINE TARTRATE-TIMOLOL 0.2-0.5 % OP SOLN: 1.0000 [drp] | Freq: Two times a day (BID) | OPHTHALMIC | Status: DC

## 2014-10-14 MED ORDER — DILTIAZEM HCL ER COATED BEADS 240 MG PO CP24
240.0000 mg | ORAL_CAPSULE | Freq: Every evening | ORAL | Status: DC
  Administered 2014-10-14 – 2014-10-15 (×2): 240 mg via ORAL
  Filled 2014-10-14 (×2): qty 1

## 2014-10-14 MED ORDER — STROKE: EARLY STAGES OF RECOVERY BOOK
Freq: Once | Status: AC
  Administered 2014-10-14: 13:00:00

## 2014-10-14 MED ORDER — INSULIN ASPART 100 UNIT/ML ~~LOC~~ SOLN: 0.0000 [IU] | Freq: Every day | SUBCUTANEOUS | Status: DC

## 2014-10-14 MED ORDER — BRIMONIDINE TARTRATE 0.2 % OP SOLN
1.0000 [drp] | Freq: Two times a day (BID) | OPHTHALMIC | Status: DC
  Administered 2014-10-14 – 2014-10-15 (×2): 1 [drp] via OPHTHALMIC
  Filled 2014-10-14: qty 5

## 2014-10-14 MED ORDER — LEVOTHYROXINE SODIUM 200 MCG PO TABS: 200.0000 ug | ORAL_TABLET | Freq: Every day | ORAL | Status: DC

## 2014-10-14 NOTE — Consult Note (Addendum)
Stroke Consult    Chief Complaint: slurred speech, facial droop HPI: [name] is an 81 y.o. male hx of HLD, DM presenting with acute onset of slurred speech, facial droop, weakness and gait instability. LSW 2230 on 10/01. Around 0230 he awoke and noted difficulty speaking, had trouble getting the words out and wife noted he was slurring his speech. Stood up to use the restroom and noted he was weak. Unsure if unilateral or generalized. Fell walking to the bathroom. Upon EMS arrival they noted he had a facial droop but was otherwise asymptomatic. Noted to be hypertensive upon arrival to ED with BP of 182/88. Denies any prior CVA or TIA. Is not on any antiplatelet at home.   CT head imaging reviewed and no acute process noted. Initial NIHSS of 1 for right facial droop.   Date last known well: 10/13/2014 Time last known well: 2230 tPA Given: no, out of IV tPA window Modified Rankin: Rankin Score=1  Past Medical History  Diagnosis Date  . History of skin cancer   . Hypothyroidism   . Asthmatic bronchitis   . Allergic rhinitis   . Urticaria   . Dyslipidemia   . DM type 2 (diabetes mellitus, type 2) ([place])   . Macular degeneration     injections done bilaterally recent - 08-22-13  . Dysrhythmia     '97 tachycardia- no recent issues  . Arthritis     arthritis-kyphosis  . GERD (gastroesophageal reflux disease)   . Prostate cancer [facility])     Prostate cancer-radiation only,skin cancer-basal cell and last squamous cell right eye  . Urgency-frequency syndrome   . History of kidney stones   . Hearing impaired     bilateral hearing aids  . DVT, lower extremity, proximal, acute [facility])     Past Surgical History  Procedure Laterality Date  . Popliteal synovial cyst excision  2005  . Gallbladder surgery  2006    no cholecystectomy  . Total knee arthroplasty  2008    right  . Total knee arthroplasty  2010    left  . Partial thymectomy  1985    for nodules  . Lung removal, partial  age  19    for bronchiectasis  . Cataract surgery Bilateral   . Colonoscopy w/ polypectomy      x2   . Cystoscopy with retrograde pyelogram, ureteroscopy and stent placement    . Back surgery      lumbar fracture  . Eye surgery      macular degeneration  . Hernia repair    . Cystoscopy with injection N/A 09/11/2013    Procedure: CYSTOSCOPY WITH BOTOX INJECTION INTO THE BLADDER;  Surgeon: Ailene Rud, MD;  Location: [facility];  Service: Urology;  Laterality: N/A;    Family History  Problem Relation Age of Onset  . Other Sister     ophth disease  . Heart attack Father     x7  . Heart disease Father     MI x7  . Hypertension Mother   . Stroke Mother 58   Social History:  reports that he has never smoked. He has never used smokeless tobacco. He reports that he does not drink alcohol or use illicit drugs.  Allergies:  Allergies  Allergen Reactions  . Colchicine Anaphylaxis  . Atorvastatin Other (See Comments)    Joint soreness  . Ceftin Diarrhea  . Codeine Hives  . Erythromycin Diarrhea  . Ezetimibe Other (See Comments)    Joint soreness  .  Hibiclens [Chlorhexidine] Hives    Cannot use must use ivory soap  . Nitroglycerin Other (See Comments)    lowers blood pressure   . Oxycodone-Acetaminophen Hives  . Rosuvastatin Other (See Comments)    Joint soreness  . Simvastatin Other (See Comments)    Joint soreness  . Vantin Other (See Comments)    Doesn't remember  . Contrast Media [Iodinated Diagnostic Agents] Nausea And Vomiting and Rash  . Tetanus Toxoid Rash     (Not in a hospital admission)  ROS: Out of a complete 14 system review, the patient complains of only the following symptoms, and all other reviewed systems are negative. +weakness  Physical Examination: Filed Vitals:   10/14/14 0345  BP: 182/88  Pulse: 65  Temp: 97.9 F (36.6 C)  Resp: 22   Physical Exam  Constitutional: He appears well-developed and well-nourished.  Psych: Affect appropriate  to situation Eyes: No scleral injection HENT: No OP obstrucion Head: Normocephalic.  Cardiovascular: Normal rate and regular rhythm.  Respiratory: Effort normal and breath sounds normal.  GI: Soft. Bowel sounds are normal. No distension. There is no tenderness.  Skin: WDI, multiple skin lacerations noted   Neurologic Examination: Mental Status: Alert, oriented, thought content appropriate.  Speech fluent without evidence of aphasia.  Able to follow 3 step commands without difficulty. Cranial Nerves: II: funduscopic exam wnl bilaterally, visual fields grossly normal, pupils equal, round, reactive to light and accommodation III,IV, VI: ptosis not present, extra-ocular motions intact bilaterally V,VII: mild facial weakness on the right, facial light touch sensation normal bilaterally VIII: hearing normal bilaterally IX,X: gag reflex present XI: trapezius strength/neck flexion strength normal bilaterally XII: tongue strength normal  Motor: Right : Upper extremity    Left:     Upper extremity 5/5 deltoid       5/5 deltoid 5/5 biceps      5/5 biceps  5/5 triceps      5/5 triceps 5/5 hand grip      5/5 hand grip  Lower extremity     Lower extremity 5/5 hip flexor      5/5 hip flexor 5/5 quadricep      5/5 quadriceps  5/5 hamstrings     5/5 hamstrings 5/5 plantar flexion       5/5 plantar flexion 5/5 plantar extension     5/5 plantar extension Tone and bulk:normal tone throughout; no atrophy noted Sensory: Pinprick and light touch intact throughout, bilaterally Deep Tendon Reflexes: 2+ and symmetric throughout Plantars: Right: downgoing   Left: downgoing Cerebellar: normal finger-to-nose, and normal heel-to-shin test Gait: deferred  Laboratory Studies:   Basic Metabolic Panel:  Recent Labs Lab 10/14/14 0402  NA 140  K 3.7  CL 104  GLUCOSE 106*  BUN 22*  CREATININE 1.20    Liver Function Tests: No results for input(s): AST, ALT, ALKPHOS, BILITOT, PROT, ALBUMIN in  the last 168 hours. No results for input(s): LIPASE, AMYLASE in the last 168 hours. No results for input(s): AMMONIA in the last 168 hours.  CBC:  Recent Labs Lab 10/14/14 0354 10/14/14 0402  WBC 8.5  --   NEUTROABS 6.5  --   HGB 14.2 15.0  HCT 43.1 44.0  MCV 91.1  --   PLT 209  --     Cardiac Enzymes: No results for input(s): CKTOTAL, CKMB, CKMBINDEX, TROPONINI in the last 168 hours.  BNP: Invalid input(s): POCBNP  CBG:  Recent Labs Lab 10/14/14 0410  GLUCAP 97    Microbiology: No results found  for this or any previous visit.  Coagulation Studies:  Recent Labs  10/14/14 0354  LABPROT 13.9  INR 1.05    Urinalysis: No results for input(s): COLORURINE, LABSPEC, PHURINE, GLUCOSEU, HGBUR, BILIRUBINUR, KETONESUR, PROTEINUR, UROBILINOGEN, NITRITE, LEUKOCYTESUR in the last 168 hours.  Invalid input(s): APPERANCEUR  Lipid Panel:     Component Value Date/Time   CHOL 218* 04/02/2011 0805   TRIG 122.0 04/02/2011 0805   HDL 41.70 04/02/2011 0805   CHOLHDL 5 04/02/2011 0805   VLDL 24.4 04/02/2011 0805   LDLCALC * 03/13/2010 1513    150        Total Cholesterol/HDL:CHD Risk Coronary Heart Disease Risk Table                     Men   Women  1/2 Average Risk   3.4   3.3  Average Risk       5.0   4.4  2 X Average Risk   9.6   7.1  3 X Average Risk  23.4   11.0        Use the calculated Patient Ratio above and the CHD Risk Table to determine the patient's CHD Risk.        ATP III CLASSIFICATION (LDL):  <100     mg/dL   Optimal  100-129  mg/dL   Near or Above                    Optimal  130-159  mg/dL   Borderline  160-189  mg/dL   High  >190     mg/dL   Very High    HgbA1C:  Lab Results  Component Value Date   HGBA1C 6.9* 04/02/2011    Urine Drug Screen:  No results found for: LABOPIA, COCAINSCRNUR, LABBENZ, AMPHETMU, THCU, LABBARB  Alcohol Level: No results for input(s): ETH in the last 168 hours.  Other results: EKG: normal sinus  rhythm.  Imaging: No results found.  Assessment: 81 y.o. male hx of HLD, DM presenting with transient episode of slurred speech, facial droop and weakness. Code stroke activated. Symptoms mostly resolved upon arrival to ED though still with trace right facial weakness. CT head imaging unremarkable. Out of tPA window. Will admit for TIA/CVA workup.   Plan: 1. HgbA1c, fasting lipid panel 2. MRI, MRA  of the brain without contrast 3. PT consult, OT consult, Speech consult 4. Echocardiogram 5. Carotid dopplers 6. Prophylactic therapy-ASA 325mg  daily 7. Risk factor modification 8. Telemetry monitoring 9. Frequent neuro checks 10. NPO until RN stroke swallow screen  Jim Like, DO [facility] [phone]  If 7pm- 7am, please page neurology on call as listed in [place]. 10/14/2014, 4:29 AM

## 2014-10-14 NOTE — ED Notes (Signed)
Pt to radiology.

## 2014-10-14 NOTE — ED Notes (Signed)
Pt brought back to room, monitor reapplied.

## 2014-10-14 NOTE — Progress Notes (Signed)
Code Stroke called on 81 y.o male with acute onset slurred speech, facial droop and weakness. Pertinent history includes dyslipidemia, DVT.  LSN 3664 when going to bed. Found per wife around 0200 with trouble getting his words out and slurred speech. While standing to use restroom at home pt with noted weakness and suffered a fall while en route to [facility], resulting in small laceration on lip and skin tears on bilateral arms. Upon EMS arrival Pt found with facial droop otherwise asymptomatic. NIHSS completed yielding 1 for right facial droop. CBG 97. Due to mild resolving symptoms and being far out of the window Pt not a candidate for TPA at this time. Pt for admit and stroke work up.

## 2014-10-14 NOTE — ED Notes (Addendum)
CBG = 127  Nikki RN informed of result.

## 2014-10-14 NOTE — Progress Notes (Signed)
STROKE TEAM PROGRESS NOTE  HPI ADAEL CULBREATH is an 81 y.o. male hx of HLD, DM presenting with acute onset of slurred speech, facial droop, weakness and gait instability. LSW 2230 on 10/01. Around 0230 he awoke and noted difficulty speaking, had trouble getting the words out and wife noted he was slurring his speech. Stood up to use the restroom and noted he was weak. Unsure if unilateral or generalized. Fell walking to the bathroom. Upon EMS arrival they noted he had a facial droop but was otherwise asymptomatic. Noted to be hypertensive upon arrival to ED with BP of 182/88. Denies any prior CVA or TIA. Is not on any antiplatelet at home.   CT head imaging reviewed and no acute process noted. Initial NIHSS of 1 for right facial droop.   Date last known well: 10/13/2014 Time last known well: 2230 tPA Given: no, out of IV tPA window Modified Rankin: Rankin Score=1    SUBJECTIVE (INTERVAL HISTORY) The patient's wife is present. The patient states that his deficits began to improve once he was in the ambulance on the way to the hospital. He reports that he had a blood clot in his right leg about 2 months ago had been placed on Coumadin for approximately 6 weeks. He is now back on aspirin. He denies history of atrial fibrillation. [name] discussed possible participation in the [place]:  [97.9 F (36.6 C)-98.4 F (36.9 C)] 98.4 F (36.9 C) (10/02 1433) Pulse Rate:  [65-98] 77 (10/02 1433) Cardiac Rhythm:  [-] Normal sinus rhythm;Heart block (10/02 1318) Resp:  [16-33] 20 (10/02 1433) BP: (138-220)/(74-100) 176/85 mmHg (10/02 1433) SpO2:  [97 %-100 %] 99 % (10/02 1433) Weight:  [76.204 kg (168 lb)] 76.204 kg (168 lb) (10/02 0345)  CBC:   Recent Labs Lab 10/14/14 0354 10/14/14 0402  WBC 8.5  --   NEUTROABS 6.5  --   HGB 14.2 15.0  HCT 43.1 44.0  MCV 91.1  --   PLT 209  --     Basic Metabolic Panel:   Recent Labs Lab 10/14/14 0354 10/14/14 0402  NA  137 140  K 3.8 3.7  CL 101 104  CO2 26  --   GLUCOSE 114* 106*  BUN 19 22*  CREATININE 1.07 1.20  CALCIUM 8.9  --     Lipid Panel:     Component Value Date/Time   CHOL 218* 04/02/2011 0805   TRIG 122.0 04/02/2011 0805   HDL 41.70 04/02/2011 0805   CHOLHDL 5 04/02/2011 0805   VLDL 24.4 04/02/2011 0805   LDLCALC * 03/13/2010 1513    150        Total Cholesterol/HDL:CHD Risk Coronary Heart Disease Risk Table                     Men   Women  1/2 Average Risk   3.4   3.3  Average Risk       5.0   4.4  2 X Average Risk   9.6   7.1  3 X Average Risk  23.4   11.0        Use the calculated Patient Ratio above and the CHD Risk Table to determine the patient's CHD Risk.        ATP III CLASSIFICATION (LDL):  <100     mg/dL   Optimal  100-129  mg/dL   Near or Above  Optimal  130-159  mg/dL   Borderline  160-189  mg/dL   High  >190     mg/dL   Very High   HgbA1c:  Lab Results  Component Value Date   HGBA1C 6.9* 04/02/2011   Urine Drug Screen: No results found for: LABOPIA, COCAINSCRNUR, LABBENZ, AMPHETMU, THCU, LABBARB    IMAGING  Ct Head Wo Contrast 10/14/2014    1. No acute intracranial or cervical spine finding.  2. Chronic small vessel disease and moderate atrophy.      Ct Cervical Spine Wo Contrast 10/14/2014    1. No acute intracranial or cervical spine finding.  2. Chronic small vessel disease and moderate atrophy.    04:43   MRI Head / MRA Brain 10/14/2014  MRI  No acute infarct. Remote tiny right thalamic infarct. Tiny blood breakdown products right parietal lobe and posterior right operculum region may reflect result of prior hemorrhagic ischemia or remote trauma otherwise no intracranial hemorrhage. Very mild small vessel disease type changes. Global atrophy without hydrocephalus.  MRA Anterior circulation without medium or large size vessel significant stenosis or occlusion. Tiny bulge right internal carotid artery supraclinoid  segment may be a small infundibulum rather than aneurysm. Nonvisualized left posterior inferior cerebellar artery. Moderate focal narrowing mid aspect of the basilar artery greater on the right. Right anterior inferior cerebellar artery is not visualized. Mild to moderate narrowing P1-P2 segment right posterior cerebral artery. Mild posterior cerebral artery branch vessel irregularity bilaterally.   PHYSICAL EXAM Pleasant elderly Caucasian male not in distress. . Afebrile. Head is nontraumatic. Neck is supple without bruit.    Cardiac exam no murmur or gallop. Lungs are clear to auscultation. Distal pulses are well felt. Neurological Exam ;  Awake  Alert oriented x 3. Normal speech and language.eye movements full without nystagmus.fundi were not visualized. Vision acuity and fields appear normal. Hearing is normal. Palatal movements are normal. Face symmetric. Tongue midline. Normal strength, tone, reflexes and coordination. Normal sensation. Gait deferred. ASSESSMENT/PLAN Mr. MCCALL LOMAX is a 81 y.o. male with history of dyslipidemia, diabetes mellitus, dysrhythmia, prostate cancer, and recent DVT  presenting with slurred speech, facial droop, and gait instability. He did not receive IV t-PA due to minimal deficits and late presentation.  Possible TIA:  Dominant   Resultant  resolution of deficits  MRI  No acute infarct. Remote tiny right thalamic infarct.  MRA  - Nonvisualized left posterior inferior cerebellar artery. Otherwise no abnormalities.  Carotid Doppler pending  2D Echo pending  LDL pending  HgbA1c pending   VTE prophylaxis - Lovenox Diet heart healthy/carb modified Room service appropriate?: Yes; Fluid consistency:: Thin  no antithrombotic prior to admission, now on aspirin 325 mg orally every day  Patient counseled to be compliant with his antithrombotic medications  Ongoing aggressive stroke risk factor management  Therapy recommendations:  Pending  Disposition: Pending  Hypertension  Stable  Permissive hypertension (OK if < 220/120) but gradually normalize in 5-7 days  Hyperlipidemia  Home meds: WelChol 1250 mg 3 times daily - resumed in hospital  LDL pending, goal < 70  Continue statin at discharge  Diabetes  HgbA1c pending, goal < 7.0  Controlled  Other Stroke Risk Factors  Advanced age  Hx stroke/TIA - remote tiny infarct by MRI  Family hx stroke (mother)   Other Active Problems  Possible [place] Trial candidate  History of DVT with recent Coumadin therapy  Hospital day # 0  Mikey Bussing PA-C [facility] Pager [phone]  10/14/2014, 2:58 PM I have personally examined this patient, reviewed notes, independently viewed imaging studies, participated in medical decision making and plan of care. I have made any additions or clarifications directly to the above note. Agree with note above. He presented with slurred speech and right facial droop from left brain TIA etiology to be determined. He remains at risk for recurrent stroke, TIA and neurological worsening. He needs ongoing stroke evaluation and aggressive risk factor modification. Continue aspirin. Long discussion with patient and wife regarding above and answered questions. Patient is not interested in participating in [place] research trial.    Antony Contras, MD Medical Director [place] Pager: [phone] 10/14/2014 3:25 PM     To contact Stroke Continuity provider, please refer to [email or web address] After hours, contact General Neurology

## 2014-10-14 NOTE — ED Notes (Signed)
Pt presents via PTAR for confusion, slurred speech, weakness, facial droop, and fall; wife reports she was awakened by patient yelling out with slurred speech from bed asking wife to help him get up; wife noted slurred speech and increased weakness as he went to the restroom where he then fell; now has bilat skin tears on arms and upper lip lac; pt now CAOx4 with equal grip strengths but facial droop remains present; EMS reports patient was able to ambulate to ems stretcher; pt hypertensive upon ED arrival

## 2014-10-14 NOTE — H&P (Signed)
[facility] History and Physical                                                                                    [name], is a 81 y.o. male  MRN: [id number]   DOB - 1931-06-01  Admit Date - 10/14/2014  Outpatient Primary MD for the patient is Mathews Argyle, MD  Referring MD: Doy Mince / ER  Consulting MD: Janann Colonel / ER  With History of -  Past Medical History  Diagnosis Date  . History of skin cancer   . Hypothyroidism   . Asthmatic bronchitis   . Allergic rhinitis   . Urticaria   . Dyslipidemia   . DM type 2 (diabetes mellitus, type 2) ([name])   . Macular degeneration     injections done bilaterally recent - 08-22-13  . Dysrhythmia     '97 tachycardia- no recent issues  . Arthritis     arthritis-kyphosis  . GERD (gastroesophageal reflux disease)   . Prostate cancer [name]     Prostate cancer-radiation only,skin cancer-basal cell and last squamous cell right eye  . Urgency-frequency syndrome   . History of kidney stones   . Hearing impaired     bilateral hearing aids  . DVT, lower extremity, proximal, acute [name]       Past Surgical History  Procedure Laterality Date  . Popliteal synovial cyst excision  2005  . Gallbladder surgery  2006    no cholecystectomy  . Total knee arthroplasty  2008    right  . Total knee arthroplasty  2010    left  . Partial thymectomy  1985    for nodules  . Lung removal, partial  age 60    for bronchiectasis  . Cataract surgery Bilateral   . Colonoscopy w/ polypectomy      x2   . Cystoscopy with retrograde pyelogram, ureteroscopy and stent placement    . Back surgery      lumbar fracture  . Eye surgery      macular degeneration  . Hernia repair    . Cystoscopy with injection N/A 09/11/2013    Procedure: CYSTOSCOPY WITH BOTOX INJECTION INTO THE BLADDER;  Surgeon: Ailene Rud, MD;  Location: [facility]S;  Service: Urology;  Laterality: N/A;    in for   Chief Complaint  Patient presents with  . Facial  Droop  . Fall  . Altered Mental Status     HPI This is an 81 year old male patient history of diabetes mellitus on insulin, postsurgical hypothyroidism, dyslipidemia, chronic constipation, paroxysmal atrial fibrillation previously not on anticoagulation who presents with strokelike symptoms. According to the patient he awakened early this morning to go to the bathroom, he attempted to utilize his walker as per his routine but found he was unable to keep his balance or walk and he could not talk. His wife also noticed that he had left facial droop. While trying to get to the bathroom the patient also fell hitting his face including his lower lip. He had last been seen normal at least 5 hours prior to awakening. His symptoms had begun to improve prior to arrival to the ER and  have subsequently resolved. He has never had symptoms like this before. In his problem list he does have dizziness and ataxia documented and as mentioned he typically ambulates with a walker.  In the ER he was afebrile, moderately hypertensive with a blood pressure 192/88, pulse was 65, respirations 22 and room air sats 97%. Code stroke had been initiated upon arrival and patient was quickly evaluated by neurology. CT of the head was unremarkable. EKG revealed sinus rhythm with voltage criteria consistent with likely LVH. Laboratory data unremarkable except for mild prerenal azotemia BUN 22, subtle elevation in glucose 106, troponin was normal, CBC was normal and coag panel was normal. Patient has subsequent pass bedside swallowing evaluation.  Review of Systems   In addition to the HPI above,  No Fever-chills, myalgias or other constitutional symptoms No Headache, changes with Vision or hearing No problems swallowing food or Liquids, indigestion/reflux No Chest pain, Cough or Shortness of Breath, palpitations, orthopnea or DOE No Abdominal pain, N/V; no melena or hematochezia, no dark tarry stools, Bowel movements are  regular, No dysuria, hematuria or flank pain No new skin rashes, lesions, masses or bruises, No new joints pains-aches No recent weight gain or loss No polyuria, polydypsia or polyphagia,  *A full 10 point Review of Systems was done, except as stated above, all other Review of Systems were negative.  Social History Social History  Substance Use Topics  . Smoking status: Never Smoker   . Smokeless tobacco: Never Used  . Alcohol Use: No    Resides at: Private residence  Lives with: Spouse  Ambulatory status: With aid of walker   Family History Family History  Problem Relation Age of Onset  . Other Sister     ophth disease  . Heart attack Father     x7  . Heart disease Father     MI x7  . Hypertension Mother   . Stroke Mother 85     Prior to Admission medications   Medication Sig Start Date End Date Taking? Authorizing Provider  acetaminophen (TYLENOL) 500 MG tablet Take 1,000 mg by mouth every 6 (six) hours as needed for mild pain or moderate pain.    Historical Provider, MD  benzonatate (TESSALON) 100 MG capsule Take 1-2 capsules (100-200 mg total) by mouth every 6 (six) hours as needed for cough. 07/24/14   Noralee Space, MD  Cholecalciferol (VITAMIN D) 2000 UNITS tablet Take 2,000 Units by mouth daily.    Historical Provider, MD  colesevelam (WELCHOL) 625 MG tablet Take 1,250 mg by mouth 3 (three) times daily.    Historical Provider, MD  COMBIGAN 0.2-0.5 % ophthalmic solution Place 1 drop into both eyes every 12 (twelve) hours.  05/25/14   Historical Provider, MD  DIGITEK 125 MCG tablet Take 1 tablet by mouth daily. 05/02/14   Historical Provider, MD  diltiazem (CARDIZEM CD) 240 MG 24 hr capsule Take 240 mg by mouth every evening.    Historical Provider, MD  docusate sodium (COLACE) 100 MG capsule Take 100 mg by mouth daily as needed for mild constipation.    Historical Provider, MD  doxycycline (VIBRA-TABS) 100 MG tablet Take 1 tablet (100 mg total) by mouth 2 (two)  times daily. 08/07/14   Deneise Lever, MD  furosemide (LASIX) 20 MG tablet Take 20 mg by mouth every morning.     Historical Provider, MD  glucose 4 GM chewable tablet Chew 1-2 tablets by mouth as needed for low blood sugar.    Historical  Provider, MD  insulin glargine (LANTUS) 100 UNIT/ML injection Inject 10 Units into the skin at bedtime. Takes 10 units if CBG is 250; at 150 takes 5-6 units    Historical Provider, MD  insulin regular (NOVOLIN R,HUMULIN R) 100 units/mL injection Inject 5-7 Units into the skin 3 (three) times daily before meals. 5-7 units according to sliding scale    Historical Provider, MD  levothyroxine (SYNTHROID) 200 MCG tablet Take 200 mcg by mouth daily before breakfast. Take with 50 mcg tablet    Historical Provider, MD  levothyroxine (SYNTHROID, LEVOTHROID) 50 MCG tablet Take 50 mcg by mouth daily before breakfast.    Historical Provider, MD  Multiple Vitamins-Minerals (OCUVITE EXTRA PO) Take 1 tablet by mouth daily.     Historical Provider, MD  omeprazole (PRILOSEC) 20 MG capsule Take 20 mg by mouth daily.    Historical Provider, MD  polyethylene glycol (MIRALAX / GLYCOLAX) packet Take 17 g by mouth daily.    Historical Provider, MD  zolpidem (AMBIEN) 5 MG tablet Take 1 tablet (5 mg total) by mouth at bedtime as needed for sleep. 07/26/14   Deneise Lever, MD    Allergies  Allergen Reactions  . Colchicine Anaphylaxis  . Atorvastatin Other (See Comments)    Joint soreness  . Ceftin Diarrhea  . Codeine Hives  . Erythromycin Diarrhea  . Ezetimibe Other (See Comments)    Joint soreness  . Hibiclens [Chlorhexidine] Hives    Cannot use must use ivory soap  . Nitroglycerin Other (See Comments)    lowers blood pressure   . Oxycodone-Acetaminophen Hives  . Rosuvastatin Other (See Comments)    Joint soreness  . Simvastatin Other (See Comments)    Joint soreness  . Vantin Other (See Comments)    Doesn't remember  . Contrast Media [Iodinated Diagnostic Agents] Nausea  And Vomiting and Rash  . Tetanus Toxoid Rash    Physical Exam  Vitals  Blood pressure 169/85, pulse 71, temperature 97.9 F (36.6 C), temperature source Oral, resp. rate 21, height _0  (1.803 m), weight 168 lb (76.204 kg), SpO2 98 %.   General:  In no acute distress, appears stated age, healthy and well nourished  Psych:  Normal affect, Denies Suicidal or Homicidal ideations, Awake Alert, Oriented X 3. Speech and thought patterns are clear and appropriate, no apparent short term memory deficits  Neuro:   No focal neurological deficits, CN II through XII intact except for noted slight left facial drooping; baseline hard of hearing, Strength 5/5 all 4 extremities, Sensation intact all 4 extremities.  ENT:  Ears and Eyes appear Normal, Conjunctivae clear, PER. Moist oral mucosa without erythema or exudates.  Neck:  Supple, No lymphadenopathy appreciated  Respiratory:  Symmetrical chest wall movement, Good air movement bilaterally, CTAB. Room Air  Cardiac:  RRR, No Murmurs, no LE edema noted, no JVD, No carotid bruits, peripheral pulses palpable at 2+  Abdomen:  Positive bowel sounds, Soft, Non tender, Non distended,  No masses appreciated, no obvious hepatosplenomegaly  Skin:  No Cyanosis, Normal Skin Turgor, No Skin Rash or Bruise.  Extremities: Symmetrical without obvious trauma or injury,  no effusions.  Data Review  CBC  Recent Labs Lab 10/14/14 0354 10/14/14 0402  WBC 8.5  --   HGB 14.2 15.0  HCT 43.1 44.0  PLT 209  --   MCV 91.1  --   MCH 30.0  --   MCHC 32.9  --   RDW 12.5  --   LYMPHSABS 1.1  --  MONOABS 0.8  --   EOSABS 0.1  --   BASOSABS 0.0  --     Chemistries   Recent Labs Lab 10/14/14 0354 10/14/14 0402  NA 137 140  K 3.8 3.7  CL 101 104  CO2 26  --   GLUCOSE 114* 106*  BUN 19 22*  CREATININE 1.07 1.20  CALCIUM 8.9  --   AST 16  --   ALT 11*  --   ALKPHOS 58  --   BILITOT 0.9  --     estimated creatinine clearance is 52.3 mL/min  (by C-G formula based on Cr of 1.2).  No results for input(s): TSH, T4TOTAL, T3FREE, THYROIDAB in the last 72 hours.  Invalid input(s): FREET3  Coagulation profile  Recent Labs Lab 10/14/14 0354  INR 1.05    No results for input(s): DDIMER in the last 72 hours.  Cardiac Enzymes No results for input(s): CKMB, TROPONINI, MYOGLOBIN in the last 168 hours.  Invalid input(s): CK  Invalid input(s): POCBNP  Urinalysis    Component Value Date/Time   COLORURINE YELLOW 05/26/2014 2040   APPEARANCEUR CLEAR 05/26/2014 2040   LABSPEC 1.015 05/26/2014 2040   PHURINE 5.0 05/26/2014 2040   GLUCOSEU NEGATIVE 05/26/2014 2040   HGBUR NEGATIVE 05/26/2014 2040   BILIRUBINUR NEGATIVE 05/26/2014 2040   KETONESUR NEGATIVE 05/26/2014 2040   PROTEINUR NEGATIVE 05/26/2014 2040   UROBILINOGEN 0.2 05/26/2014 2040   NITRITE NEGATIVE 05/26/2014 2040   LEUKOCYTESUR NEGATIVE 05/26/2014 2040    Imaging results:   Ct Head Wo Contrast  10/14/2014   CLINICAL DATA:  Slurred speech starting at 10:30.  Facial droop.  EXAM: CT HEAD WITHOUT CONTRAST  CT CERVICAL SPINE WITHOUT CONTRAST  TECHNIQUE: Multidetector CT imaging of the head and cervical spine was performed following the standard protocol without intravenous contrast. Multiplanar CT image reconstructions of the cervical spine were also generated.  COMPARISON:  05/26/2014  FINDINGS: CT HEAD FINDINGS  Skull and Sinuses:Negative for fracture or destructive process. The mastoids, middle ears, and imaged paranasal sinuses are clear.  Orbits: Bilateral cataract resection.  Brain: Stable appearance. No evidence of acute infarction, hemorrhage, hydrocephalus, or mass lesion/mass effect. ASPECTS is 10. Moderate generalized atrophy. Chronic small-vessel ischemic change, mild for age, best seen present in the periventricular, bilateral subinsular, and right thalamus.  Critical Value/emergent results were called by telephone at the time of interpretation on 10/14/2014 at  4:38 am to Dr. Janann Colonel, who verbally acknowledged these results.  CT CERVICAL SPINE FINDINGS  No evidence of acute fracture or traumatic malalignment. No gross canal hematoma or prevertebral edema.  Diffuse facet arthropathy with multi-level ankylosis. Diffuse degenerative disc disease with disc ossification. C7-T1 anterolisthesis is degenerative appearing.  IMPRESSION: 1. No acute intracranial or cervical spine finding. 2. Chronic small vessel disease and moderate atrophy.   Electronically Signed   By: Monte Fantasia M.D.   On: 10/14/2014 04:43   Ct Cervical Spine Wo Contrast  10/14/2014   CLINICAL DATA:  Slurred speech starting at 10:30.  Facial droop.  EXAM: CT HEAD WITHOUT CONTRAST  CT CERVICAL SPINE WITHOUT CONTRAST  TECHNIQUE: Multidetector CT imaging of the head and cervical spine was performed following the standard protocol without intravenous contrast. Multiplanar CT image reconstructions of the cervical spine were also generated.  COMPARISON:  05/26/2014  FINDINGS: CT HEAD FINDINGS  Skull and Sinuses:Negative for fracture or destructive process. The mastoids, middle ears, and imaged paranasal sinuses are clear.  Orbits: Bilateral cataract resection.  Brain: Stable appearance. No evidence  of acute infarction, hemorrhage, hydrocephalus, or mass lesion/mass effect. ASPECTS is 10. Moderate generalized atrophy. Chronic small-vessel ischemic change, mild for age, best seen present in the periventricular, bilateral subinsular, and right thalamus.  Critical Value/emergent results were called by telephone at the time of interpretation on 10/14/2014 at 4:38 am to Dr. Janann Colonel, who verbally acknowledged these results.  CT CERVICAL SPINE FINDINGS  No evidence of acute fracture or traumatic malalignment. No gross canal hematoma or prevertebral edema.  Diffuse facet arthropathy with multi-level ankylosis. Diffuse degenerative disc disease with disc ossification. C7-T1 anterolisthesis is degenerative appearing.   IMPRESSION: 1. No acute intracranial or cervical spine finding. 2. Chronic small vessel disease and moderate atrophy.   Electronically Signed   By: Monte Fantasia M.D.   On: 10/14/2014 04:43     EKG: (Independently reviewed) sinus rhythm ventricular rate 66 bpm, QTC 457 ms, elevated J-point in inferior leads, voltage criteria met for LVH, no ST segment or T-wave changes concerning for ischemia.   Assessment & Plan  Principal Problem:   CVA/TIA ([place]) -Admit to telemetry -Neurology following -Stat MRI/MRA brain -Echocardiogram and carotid duplex -Hemoglobin A1c and lipid panel -PT/OT/SLP evaluations -Full dose aspirin for anti-platelet -Was not on statin prior to admission but was taking WelChol  Active Problems:   Paroxysmal atrial fibrillation (HCC)   -CHADVASc = 5; previously not on anticoagulation and given documentation of dizziness and ataxia in the problem list anticoagulation may have been deferred due to concern over falls -pending PT/OT evaluation may need to revisit anticoagulation if truly has CVA -Continue digoxin and calcium channel blocker    Mild HTN -Does not have hypertension listed as chronic problem -Blood pressure has remained in hypertensive range since arrival -Prior to admission was on calcium channel blocker presumably for rate control with history of paroxysmal atrial fibrillation -If blood pressure not controlled with utilization of calcium channel blocker will need to add other agents -Was also on Lasix prior to admission for unclear reasons; does not have evidence of chronic edema and no apparent history of heart failure were no prior echo for comparison    Hypothyroidism (post surgical) -On very high-dose Synthroid (confirmed with patient) -Check TSH    Diabetes mellitus, insulin dependent (IDDM), controlled (HCC) -Hemoglobin A1c pending per protocol -Continue preadmission Lantus -Check CBGs and provide SSI    Prerenal azotemia -Mild and likely  secondary to concomitant use of Lasix -Holding Lasix at time of admission    HYPERLIPIDEMIA -Continue WelChol but given presentation with admission on TIA symptoms may need to consider utilization of statin    Chronic constipation -Patient endorses taking daily MiraLAX which has been reordered    DVT Prophylaxis: Lovenox  Family Communication:   No family at bedside  Code Status:  Full code  Condition:  Stable  Discharge disposition: Anticipate discharge back to home environment pending PT/OT evaluation and next 24-48 hours as long as medically stable  Time spent in minutes : 60      ELLIS,ALLISON L. ANP on 10/14/2014 at 8:11 AM  Between 7am to 7pm - Pager - [phone]  After 7pm go to [email or web address] - password [email or web address]  And look for the night coverage person covering me after hours  [facility]

## 2014-10-14 NOTE — Progress Notes (Signed)
Patient recd from ED via stretcher accompanied by wife. Tele applied. Safety plan discussed with patient and his wife. Bed in low position, call bell in reach, bed alarm on.

## 2014-10-14 NOTE — ED Notes (Signed)
Pt returned from scans. Monitored by pulse ox, bp cuff, and 5-lead. 

## 2014-10-14 NOTE — Care Management Note (Signed)
Case Management Note  Patient Details  Name: [name] MRN: [id number] Date of Birth: 1931-12-12  Subjective/Objective:        81 y.o. M admitted to Tele after falling at home in the middle of the night when he got up to go to the bathroom with the use of a walker.  Spouse called EMS as he was unable to talk, had facial droop, and weakness. Symptoms have since resolved. Awaiting PT/OT evaluation. MD suggests that pt will probably need HHPT upon discharge home with spouse where he currently lives in private residence. Will continue to follow for Discharge needs.              Action/Plan: CM will follow for disposition and discharge needs.    Expected Discharge Date:                  Expected Discharge Plan:  [place]  In-House Referral:     Discharge planning Services  CM Consult  Post Acute Care Choice:    Choice offered to:     DME Arranged:   (Has Walker) DME Agency:     HH Arranged:    [place]:     Status of Service:  In process, will continue to follow  Medicare Important Message Given:    Date Medicare IM Given:    Medicare IM give by:    Date Additional Medicare IM Given:    Additional Medicare Important Message give by:     If discussed at [place] of Stay Meetings, dates discussed:    Additional Comments:  Delrae Sawyers, RN 10/14/2014, 11:01 AM

## 2014-10-14 NOTE — ED Notes (Signed)
Admitting MD at bedside.

## 2014-10-14 NOTE — ED Provider Notes (Signed)
CSN: [id number]     Arrival date & time 10/14/14  0341 History   First MD Initiated Contact with Patient 10/14/14 0350     Chief Complaint  Patient presents with  . Facial Droop  . Fall  . Altered Mental Status    An emergency department physician performed an initial assessment on this suspected stroke patient at 63. (Consider location/radiation/quality/duration/timing/severity/associated sxs/prior Treatment) HPI Comments: 81 yo male who woke up to go to the bathroom and realized he couldn't walk or talk.  Wife found him to have left facial droop.  He tried to go to the bathroom, but fell, cutting his low lip.  No LOC.  Symptoms have improved significantly since he awoke.    Patient is a 81 y.o. male presenting with neurologic complaint.  Neurologic Problem This is a new problem. Episode onset: last seen normal 5.5 hours PTA. The problem occurs constantly. The problem has been gradually improving. Pertinent negatives include no chest pain and no shortness of breath. Nothing aggravates the symptoms. Nothing relieves the symptoms.    Past Medical History  Diagnosis Date  . History of skin cancer   . Hypothyroidism   . Asthmatic bronchitis   . Allergic rhinitis   . Urticaria   . Dyslipidemia   . DM type 2 (diabetes mellitus, type 2) ([place])   . Macular degeneration     injections done bilaterally recent - 08-22-13  . Dysrhythmia     '97 tachycardia- no recent issues  . Arthritis     arthritis-kyphosis  . GERD (gastroesophageal reflux disease)   . Prostate cancer [facility])     Prostate cancer-radiation only,skin cancer-basal cell and last squamous cell right eye  . Urgency-frequency syndrome   . History of kidney stones   . Hearing impaired     bilateral hearing aids  . DVT, lower extremity, proximal, acute [facility])    Past Surgical History  Procedure Laterality Date  . Popliteal synovial cyst excision  2005  . Gallbladder surgery  2006    no cholecystectomy  . Total knee  arthroplasty  2008    right  . Total knee arthroplasty  2010    left  . Partial thymectomy  1985    for nodules  . Lung removal, partial  age 44    for bronchiectasis  . Cataract surgery Bilateral   . Colonoscopy w/ polypectomy      x2   . Cystoscopy with retrograde pyelogram, ureteroscopy and stent placement    . Back surgery      lumbar fracture  . Eye surgery      macular degeneration  . Hernia repair    . Cystoscopy with injection N/A 09/11/2013    Procedure: CYSTOSCOPY WITH BOTOX INJECTION INTO THE BLADDER;  Surgeon: Ailene Rud, MD;  Location: [facility]S;  Service: Urology;  Laterality: N/A;   Family History  Problem Relation Age of Onset  . Other Sister     ophth disease  . Heart attack Father     x7  . Heart disease Father     MI x7  . Hypertension Mother   . Stroke Mother 64   Social History  Substance Use Topics  . Smoking status: Never Smoker   . Smokeless tobacco: Never Used  . Alcohol Use: No    Review of Systems  Respiratory: Negative for shortness of breath.   Cardiovascular: Negative for chest pain.  All other systems reviewed and are negative.     Allergies  Colchicine; Atorvastatin; Ceftin; Codeine; Erythromycin; Ezetimibe; Hibiclens; Nitroglycerin; Oxycodone-acetaminophen; Rosuvastatin; Simvastatin; Vantin; Contrast media; and Tetanus toxoid  Home Medications   Prior to Admission medications   Medication Sig Start Date End Date Taking? Authorizing Provider  acetaminophen (TYLENOL) 500 MG tablet Take 1,000 mg by mouth every 6 (six) hours as needed for mild pain or moderate pain.    Historical Provider, MD  benzonatate (TESSALON) 100 MG capsule Take 1-2 capsules (100-200 mg total) by mouth every 6 (six) hours as needed for cough. 07/24/14   Noralee Space, MD  Cholecalciferol (VITAMIN D) 2000 UNITS tablet Take 2,000 Units by mouth daily.    Historical Provider, MD  colesevelam (WELCHOL) 625 MG tablet Take 1,250 mg by mouth 3 (three) times  daily.    Historical Provider, MD  COMBIGAN 0.2-0.5 % ophthalmic solution Place 1 drop into both eyes every 12 (twelve) hours.  05/25/14   Historical Provider, MD  DIGITEK 125 MCG tablet Take 1 tablet by mouth daily. 05/02/14   Historical Provider, MD  diltiazem (CARDIZEM CD) 240 MG 24 hr capsule Take 240 mg by mouth every evening.    Historical Provider, MD  docusate sodium (COLACE) 100 MG capsule Take 100 mg by mouth daily as needed for mild constipation.    Historical Provider, MD  doxycycline (VIBRA-TABS) 100 MG tablet Take 1 tablet (100 mg total) by mouth 2 (two) times daily. 08/07/14   Deneise Lever, MD  furosemide (LASIX) 20 MG tablet Take 20 mg by mouth every morning.     Historical Provider, MD  glucose 4 GM chewable tablet Chew 1-2 tablets by mouth as needed for low blood sugar.    Historical Provider, MD  insulin glargine (LANTUS) 100 UNIT/ML injection Inject 10 Units into the skin at bedtime. Takes 10 units if CBG is 250; at 150 takes 5-6 units    Historical Provider, MD  insulin regular (NOVOLIN R,HUMULIN R) 100 units/mL injection Inject 5-7 Units into the skin 3 (three) times daily before meals. 5-7 units according to sliding scale    Historical Provider, MD  levothyroxine (SYNTHROID) 200 MCG tablet Take 200 mcg by mouth daily before breakfast. Take with 50 mcg tablet    Historical Provider, MD  levothyroxine (SYNTHROID, LEVOTHROID) 50 MCG tablet Take 50 mcg by mouth daily before breakfast.    Historical Provider, MD  Multiple Vitamins-Minerals (OCUVITE EXTRA PO) Take 1 tablet by mouth daily.     Historical Provider, MD  omeprazole (PRILOSEC) 20 MG capsule Take 20 mg by mouth daily.    Historical Provider, MD  polyethylene glycol (MIRALAX / GLYCOLAX) packet Take 17 g by mouth daily.    Historical Provider, MD  zolpidem (AMBIEN) 5 MG tablet Take 1 tablet (5 mg total) by mouth at bedtime as needed for sleep. 07/26/14   Deneise Lever, MD   BP 176/84 mmHg  Pulse 72  Temp(Src) 97.9 F  (36.6 C) (Oral)  Resp 18  Ht 5\' 11"  (1.803 m)  Wt 168 lb (76.204 kg)  BMI 23.44 kg/m2  SpO2 99% Physical Exam  Constitutional: He is oriented to person, place, and time. He appears well-developed and well-nourished. No distress.  HENT:  Head: Normocephalic and atraumatic.  Mouth/Throat: Oropharynx is clear and moist.  Eyes: Conjunctivae are normal. Pupils are equal, round, and reactive to light. No scleral icterus.  Neck: Neck supple.  Cardiovascular: Normal rate, regular rhythm, normal heart sounds and intact distal pulses.   No murmur heard. Pulmonary/Chest: Effort normal and breath sounds  normal. No stridor. No respiratory distress. He has no wheezes. He has no rales.  Abdominal: Soft. He exhibits no distension. There is no tenderness.  Musculoskeletal: Normal range of motion. He exhibits no edema.  Neurological: He is alert and oriented to person, place, and time.  Minimal left facial weakness and minimal slurred speech.  Normal strength.    Skin: Skin is warm and dry. No rash noted.  Psychiatric: He has a normal mood and affect. His behavior is normal.  Nursing note and vitals reviewed.   ED Course  Procedures (including critical care time) Labs Review Labs Reviewed  COMPREHENSIVE METABOLIC PANEL - Abnormal; Notable for the following:    Glucose, Bld 114 (*)    Total Protein 6.1 (*)    ALT 11 (*)    All other components within normal limits  I-STAT CHEM 8, ED - Abnormal; Notable for the following:    BUN 22 (*)    Glucose, Bld 106 (*)    All other components within normal limits  CBC  PROTIME-INR  APTT  DIFFERENTIAL  CBG MONITORING, ED  CBG MONITORING, ED  I-STAT TROPOININ, ED    Imaging Review Ct Head Wo Contrast  10/14/2014   CLINICAL DATA:  Slurred speech starting at 10:30.  Facial droop.  EXAM: CT HEAD WITHOUT CONTRAST  CT CERVICAL SPINE WITHOUT CONTRAST  TECHNIQUE: Multidetector CT imaging of the head and cervical spine was performed following the standard  protocol without intravenous contrast. Multiplanar CT image reconstructions of the cervical spine were also generated.  COMPARISON:  05/26/2014  FINDINGS: CT HEAD FINDINGS  Skull and Sinuses:Negative for fracture or destructive process. The mastoids, middle ears, and imaged paranasal sinuses are clear.  Orbits: Bilateral cataract resection.  Brain: Stable appearance. No evidence of acute infarction, hemorrhage, hydrocephalus, or mass lesion/mass effect. ASPECTS is 10. Moderate generalized atrophy. Chronic small-vessel ischemic change, mild for age, best seen present in the periventricular, bilateral subinsular, and right thalamus.  Critical Value/emergent results were called by telephone at the time of interpretation on 10/14/2014 at 4:38 am to Dr. Janann Colonel, who verbally acknowledged these results.  CT CERVICAL SPINE FINDINGS  No evidence of acute fracture or traumatic malalignment. No gross canal hematoma or prevertebral edema.  Diffuse facet arthropathy with multi-level ankylosis. Diffuse degenerative disc disease with disc ossification. C7-T1 anterolisthesis is degenerative appearing.  IMPRESSION: 1. No acute intracranial or cervical spine finding. 2. Chronic small vessel disease and moderate atrophy.   Electronically Signed   By: Monte Fantasia M.D.   On: 10/14/2014 04:43   Ct Cervical Spine Wo Contrast  10/14/2014   CLINICAL DATA:  Slurred speech starting at 10:30.  Facial droop.  EXAM: CT HEAD WITHOUT CONTRAST  CT CERVICAL SPINE WITHOUT CONTRAST  TECHNIQUE: Multidetector CT imaging of the head and cervical spine was performed following the standard protocol without intravenous contrast. Multiplanar CT image reconstructions of the cervical spine were also generated.  COMPARISON:  05/26/2014  FINDINGS: CT HEAD FINDINGS  Skull and Sinuses:Negative for fracture or destructive process. The mastoids, middle ears, and imaged paranasal sinuses are clear.  Orbits: Bilateral cataract resection.  Brain: Stable  appearance. No evidence of acute infarction, hemorrhage, hydrocephalus, or mass lesion/mass effect. ASPECTS is 10. Moderate generalized atrophy. Chronic small-vessel ischemic change, mild for age, best seen present in the periventricular, bilateral subinsular, and right thalamus.  Critical Value/emergent results were called by telephone at the time of interpretation on 10/14/2014 at 4:38 am to Dr. Janann Colonel, who verbally acknowledged these results.  CT CERVICAL SPINE FINDINGS  No evidence of acute fracture or traumatic malalignment. No gross canal hematoma or prevertebral edema.  Diffuse facet arthropathy with multi-level ankylosis. Diffuse degenerative disc disease with disc ossification. C7-T1 anterolisthesis is degenerative appearing.  IMPRESSION: 1. No acute intracranial or cervical spine finding. 2. Chronic small vessel disease and moderate atrophy.   Electronically Signed   By: Monte Fantasia M.D.   On: 10/14/2014 04:43   I have personally reviewed and evaluated these images and lab results as part of my medical decision-making.   EKG Interpretation   Date/Time:  Sunday October 14 2014 04:00:19 EDT Ventricular Rate:  66 PR Interval:  213 QRS Duration: 95 QT Interval:  436 QTC Calculation: 457 R Axis:   71 Text Interpretation:  Sinus rhythm Borderline prolonged PR interval Left  ventricular hypertrophy No significant change was found Confirmed by  [facility]  MD, TREY ([id number]) on 10/14/2014 5:43:14 AM      MDM   Final diagnoses:  CVA (cerebral infarction)  CVA (cerebral infarction)  Stroke [facility])    Code stroke called as he was last known normal 5.5 hours ago.  Symptoms, however, continued to improve.  CT negative.  Plan admit to tele for further stroke workup.      Serita Grit, MD 10/14/14 [phone]

## 2014-10-14 NOTE — ED Notes (Signed)
Report attempted 

## 2014-10-15 ENCOUNTER — Inpatient Hospital Stay (HOSPITAL_COMMUNITY): Payer: Medicare Other

## 2014-10-15 DIAGNOSIS — I6789 Other cerebrovascular disease: Secondary | ICD-10-CM

## 2014-10-15 DIAGNOSIS — Z794 Long term (current) use of insulin: Secondary | ICD-10-CM

## 2014-10-15 DIAGNOSIS — K5900 Constipation, unspecified: Secondary | ICD-10-CM

## 2014-10-15 DIAGNOSIS — E119 Type 2 diabetes mellitus without complications: Secondary | ICD-10-CM

## 2014-10-15 DIAGNOSIS — E782 Mixed hyperlipidemia: Secondary | ICD-10-CM

## 2014-10-15 DIAGNOSIS — G459 Transient cerebral ischemic attack, unspecified: Principal | ICD-10-CM

## 2014-10-15 DIAGNOSIS — I480 Paroxysmal atrial fibrillation: Secondary | ICD-10-CM

## 2014-10-15 DIAGNOSIS — I10 Essential (primary) hypertension: Secondary | ICD-10-CM

## 2014-10-15 DIAGNOSIS — G458 Other transient cerebral ischemic attacks and related syndromes: Secondary | ICD-10-CM

## 2014-10-15 DIAGNOSIS — I639 Cerebral infarction, unspecified: Secondary | ICD-10-CM

## 2014-10-15 LAB — GLUCOSE, CAPILLARY
GLUCOSE-CAPILLARY: 110 mg/dL — AB (ref 65–99)
Glucose-Capillary: 99 mg/dL (ref 65–99)
Glucose-Capillary: 122 mg/dL — ABNORMAL HIGH (ref 65–99)

## 2014-10-15 LAB — BASIC METABOLIC PANEL
ANION GAP: 6 (ref 5–15)
BUN: 16 mg/dL (ref 6–20)
CALCIUM: 8.8 mg/dL — AB (ref 8.9–10.3)
CHLORIDE: 100 mmol/L — AB (ref 101–111)
CO2: 28 mmol/L (ref 22–32)
Creatinine, Ser: 1.15 mg/dL (ref 0.61–1.24)
GFR calc Af Amer: >60 mL/min (ref >60)
GFR calc non Af Amer: 58 mL/min — ABNORMAL LOW (ref >60)
GLUCOSE: 111 mg/dL — AB (ref 65–99)
POTASSIUM: 3.8 mmol/L (ref 3.5–5.1)
Sodium: 134 mmol/L — ABNORMAL LOW (ref 135–145)

## 2014-10-15 LAB — LIPID PANEL
CHOL/HDL RATIO: 3.8 ratio
Cholesterol: 181 mg/dL (ref 0–200)
HDL: 48 mg/dL (ref >40)
LDL CALC: 111 mg/dL — AB (ref 0–99)
TRIGLYCERIDES: 110 mg/dL (ref <150)
VLDL: 22 mg/dL (ref 0–40)

## 2014-10-15 MED ORDER — APIXABAN 5 MG PO TABS: 5.0000 mg | ORAL_TABLET | Freq: Two times a day (BID) | ORAL | Status: DC

## 2014-10-15 NOTE — Evaluation (Signed)
Physical Therapy Evaluation Patient Details Name: [name] MRN: [id number] DOB: 10/09/1931 Today's Date: 10/15/2014   History of Present Illness  [name] is an 81 y.o. male hx of HLD, DM presenting with acute onset of slurred speech, facial droop, weakness and gait instability. Patient hypertensive upon arrival with systolic in the 759F. CT scan negative. MRI revealed tiny remote R thalamic infarct  Clinical Impression  Pt admitted with above. Pt with functioning near baseline, however unsteady at baseline. Pt at increased falls risk and would benefit from RW however pt strongly declining. Pt does live in retirement community and doesn't have to cook or clean. Spouse available 24/7 however fell herself 3 days ago.  Recommend HHPT to improve strength and balance.    Follow Up Recommendations Home health PT;Supervision/Assistance - 24 hour    Equipment Recommendations  Rolling walker with 5" wheels (however pt declining)    Recommendations for Other Services       Precautions / Restrictions Precautions Precautions: Fall Restrictions Weight Bearing Restrictions: No      Mobility  Bed Mobility Overal bed mobility: Needs Assistance Bed Mobility: Supine to Sit     Supine to sit: Supervision     General bed mobility comments: increased time, definite use of UEs and bed rail  Transfers Overall transfer level: Needs assistance Equipment used: None Transfers: Sit to/from Stand Sit to Stand: Min guard         General transfer comment: pt with good use of UEs however leans posteriorly and posts on back of legs on bed for stability upon initial stand  Ambulation/Gait Ambulation/Gait assistance: Min guard Ambulation Distance (Feet): 120 Feet Assistive device: None Gait Pattern/deviations: Step-through pattern;Decreased stride length;Shuffle Gait velocity: decrease   General Gait Details: pt with antalgic gait, pt reports "my Left knee gives me trouble." pt  mildly unsteady. attempted to have pt amb with cane however pt unable to sequence properly and was not beneficial. pt declined RW despite multiple recommendations to improve stability. Pt reports he's back to his baseline and he doen't need to use a RW.  Stairs            Wheelchair Mobility    Modified Rankin (Stroke Patients Only) Modified Rankin (Stroke Patients Only) Pre-Morbid Rankin Score: Slight disability Modified Rankin: Moderate disability     Balance Overall balance assessment: Needs assistance Sitting-balance support: Feet supported;No upper extremity supported Sitting balance-Leahy Scale: Fair Sitting balance - Comments: pt able to don/doff shoes   Standing balance support: No upper extremity supported Standing balance-Leahy Scale: Fair Standing balance comment: pt able to stand and urinate withotu difficulty                             Pertinent Vitals/Pain Pain Assessment: No/denies pain    Home Living Family/patient expects to be discharged to:: Private residence Living Arrangements: Spouse/significant other Available Help at Discharge: Family;Available 24 hours/day Type of Home: Independent living facility Home Access: Level entry     Home Layout: One level Home Equipment: Bedside commode;Shower seat - built in Additional Comments: drives-restricted driver's license    Prior Function Level of Independence: Independent         Comments: Pt reports he does not use AD for functional mobility PTA.     Hand Dominance   Dominant Hand: Right    Extremity/Trunk Assessment   Upper Extremity Assessment: Overall WFL for tasks assessed  Lower Extremity Assessment: Defer to PT evaluation      Cervical / Trunk Assessment: Normal  Communication   Communication: HOH;No difficulties  Cognition Arousal/Alertness: Awake/alert Behavior During Therapy: WFL for tasks assessed/performed Overall Cognitive Status: Within  Functional Limits for tasks assessed (some decreased multitasking ability that is likely baseline)                      General Comments      Exercises        Assessment/Plan    PT Assessment Patient needs continued PT services  PT Diagnosis Difficulty walking;Generalized weakness   PT Problem List Decreased strength;Decreased activity tolerance;Decreased balance;Decreased mobility  PT Treatment Interventions DME instruction;Gait training;Functional mobility training;Therapeutic activities;Therapeutic exercise;Balance training   PT Goals (Current goals can be found in the Care Plan section) Acute Rehab PT Goals Patient Stated Goal: home PT Goal Formulation: With patient Time For Goal Achievement: 10/22/14 Potential to Achieve Goals: Good    Frequency Min 3X/week   Barriers to discharge        Co-evaluation               End of Session Equipment Utilized During Treatment: Gait belt Activity Tolerance: Patient tolerated treatment well Patient left: in chair;with call bell/phone within reach;with chair alarm set Nurse Communication: Mobility status         Time: 7591-6384 PT Time Calculation (min) (ACUTE ONLY): 18 min   Charges:   PT Evaluation $Initial PT Evaluation Tier I: 1 Procedure     PT G CodesKingsley Callander 10/15/2014, 1:01 PM   Kittie Plater, PT, DPT Pager #: [phone] Office #: [phone]

## 2014-10-15 NOTE — Progress Notes (Signed)
Occupational Therapy Evaluation Patient Details Name: [name] MRN: [id number] DOB: [birth year] Today's Date: 10/15/2014    History of Present Illness [name] is an 81 y.o. male hx of HLD, DM presenting with acute onset of slurred speech, facial droop, weakness and gait instability. Patient hypertensive upon arrival with systolic in the 932I. CT scan negative. MRI revealed tiny remote R thalamic infarct   Clinical Impression   Pt admitted with the above diagnoses and presents with below problem list. Pt will benefit from continued acute OT to address the below listed deficits and maximize independence with BADLs prior to d/c home with spouse. PTA pt was independent with ADLs. Pt is currently min guard for LB ADLs, functional transfers and mobility. Pt presents with decreased standing balance and some attentional deficits (likely baseline). Pt noted to complete functional mobility with heightened attention, increased speed and wide BOS. Pt made several errors while serial counting by 2s during functional mobility but did self-correct. Pt completed ball toss between hands while seated (due to balance concerns). Pt was noted to stop tossing ball while responding to questions despite being instructed more than once to keep tossing the ball. Pt reports that he drives at baseline and wants to resume driving (restricted license); also wants to resume treadmill exercises. Recommended pt have a conversation with MD regarding driving and resuming exercise. OT to continue to follow acutely.      Follow Up Recommendations  Supervision/Assistance - 24 hour;No OT follow up    Equipment Recommendations  None recommended by OT    Recommendations for Other Services       Precautions / Restrictions Precautions Precautions: Fall Restrictions Weight Bearing Restrictions: No      Mobility Bed Mobility Overal bed mobility: Needs Assistance Bed Mobility: Supine to Sit     Supine to sit:  Supervision     General bed mobility comments: in recliner  Transfers Overall transfer level: Needs assistance Equipment used: None Transfers: Sit to/from Stand Sit to Stand: Min guard         General transfer comment: from recliner, wide BOS    Balance Overall balance assessment: Needs assistance Sitting-balance support: Feet supported;No upper extremity supported Sitting balance-Leahy Scale: Fair Sitting balance - Comments: pt able to don/doff shoes   Standing balance support: No upper extremity supported;During functional activity Standing balance-Leahy Scale: Fair Standing balance comment: pt able to stand and urinate withotu difficulty                            ADL Overall ADL's : Needs assistance/impaired Eating/Feeding: Set up;Sitting   Grooming: Min guard;Standing   Upper Body Bathing: Set up;Sitting   Lower Body Bathing: Min guard;Sit to/from stand   Upper Body Dressing : Set up;Sitting   Lower Body Dressing: Min guard;Sit to/from stand   Toilet Transfer: Min guard (3n1 over toilet)   Toileting- Clothing Manipulation and Hygiene: Min guard;Sit to/from stand   Tub/ Shower Transfer: Min guard;Walk-in shower;Ambulation;Shower seat   Functional mobility during ADLs: Min guard General ADL Comments: Pt completed grooming, toilet transfer and household distance functional mobility at levels detailed above. Pt presents with decreased standing balance and some attentional deficits (likely baseline). Pt noted to ambulate with heightened attention, increased speed and wide BOS. Pt made several errors while serial counting by 2s during functional mobility but did self-correct. Pt completed ball toss between hands while seated (due to balance concerns). Pt was noted to stop  tossing ball while responding to questions despite being instructed more than once to keep tossing the ball.      Vision Additional Comments: Pt able to read large letters on therapist  name badge; reports he cannot read at a distance  (clock on wall) at baseline.   Perception     Praxis      Pertinent Vitals/Pain Pain Assessment: No/denies pain     Hand Dominance Right   Extremity/Trunk Assessment Upper Extremity Assessment Upper Extremity Assessment: Overall WFL for tasks assessed   Lower Extremity Assessment Lower Extremity Assessment: Defer to PT evaluation   Cervical / Trunk Assessment Cervical / Trunk Assessment: Normal   Communication Communication Communication: HOH;No difficulties   Cognition Arousal/Alertness: Awake/alert Behavior During Therapy: WFL for tasks assessed/performed Overall Cognitive Status: Within Functional Limits for tasks assessed (some decreased multitasking ability that is likely baseline)                     General Comments       Exercises       Shoulder Instructions      Home Living Family/patient expects to be discharged to:: Private residence Living Arrangements: Spouse/significant other Available Help at Discharge: Family;Available 24 hours/day Type of Home: Independent living facility Home Access: Level entry     Home Layout: One level     Bathroom Shower/Tub: Occupational psychologist: Standard     Home Equipment: Bedside commode;Shower seat - built in   Additional Comments: drives-restricted driver's license      Prior Functioning/Environment Level of Independence: Independent        Comments: Pt reports he does not use AD for functional mobility PTA.    OT Diagnosis: Other (comment) (decreased balance)   OT Problem List: Decreased activity tolerance;Impaired balance (sitting and/or standing);Decreased knowledge of use of DME or AE;Decreased knowledge of precautions   OT Treatment/Interventions: Self-care/ADL training;Energy conservation;DME and/or AE instruction;Therapeutic activities;Patient/family education;Balance training    OT Goals(Current goals can be found in the  care plan section) Acute Rehab OT Goals Patient Stated Goal: home - get back to routine, exercise, driving OT Goal Formulation: With patient/family Time For Goal Achievement: 10/22/14 Potential to Achieve Goals: Good ADL Goals Pt Will Perform Grooming: Independently;standing Pt Will Perform Lower Body Bathing: Independently;sit to/from stand Pt Will Perform Lower Body Dressing: Independently;sit to/from stand Pt Will Perform Tub/Shower Transfer: Shower transfer;with modified independence;ambulating;shower seat  OT Frequency: Min 2X/week   Barriers to D/C:            Co-evaluation              End of Session Equipment Utilized During Treatment: Rolling walker;Gait belt  Activity Tolerance: Patient tolerated treatment well Patient left: in chair;with call bell/phone within reach;with chair alarm set;with family/visitor present   Time: 6203-5597 OT Time Calculation (min): 21 min Charges:  OT General Charges $OT Visit: 1 Procedure OT Evaluation $Initial OT Evaluation Tier I: 1 Procedure G-Codes:    Hortencia Pilar October 20, 2014, 1:06 PM

## 2014-10-15 NOTE — Progress Notes (Signed)
Inpatient Diabetes Program Recommendations  AACE/ADA: New Consensus Statement on Inpatient Glycemic Control (2015)  Target Ranges:  Prepandial:   less than 140 mg/dL      Peak postprandial:   less than 180 mg/dL (1-2 hours)      Critically ill patients:  140 - 180 mg/dL   Results for HUNT, ZAJICEK (MRN [id number]) as of 10/15/2014 09:05  Ref. Range 10/14/2014 09:36 10/14/2014 16:20 10/14/2014 21:44 10/15/2014 06:14  Glucose-Capillary Latest Ref Range: 65-99 mg/dL 127 (H) 262 (H) 123 (H) 110 (H)   Review of Glycemic Control  Diabetes history: DM 2 Outpatient Diabetes medications: Lantus 10 units, Novolog R 5-7 units TID meal coverage Current orders for Inpatient glycemic control: Lantus 10 units, Novolog Sensitive + HS  Inpatient Diabetes Program Recommendations: Insulin - Meal Coverage: Glucose increased to 262 at 1600 yest. Patient takes at least 5 units of meal coverage at home. Please consider Novolog 3 units TID meal coverage.  Thanks,  Tama Headings RN, MSN, [facility] Inpatient Diabetes Coordinator Team Pager [phone] (8a-5p)

## 2014-10-15 NOTE — Progress Notes (Signed)
OT Cancellation Note  Patient Details Name: TRAVONTE BYARD MRN: [id number] DOB: [birth year]   Cancelled Treatment:    Reason Eval/Treat Not Completed: Patient at procedure or test/ unavailable. Echocardiogram. OT to reattempt.   Hortencia Pilar 10/15/2014, 9:11 AM

## 2014-10-15 NOTE — Care Management Note (Signed)
Case Management Note  Patient Details  Name: [name] MRN: [id number] Date of Birth: [birth year]  Subjective/Objective:                    Action/Plan: Patient with orders for home health services and rolling walker. CM met with patient and spouse at the bedside about home health services and they stated that [facility] where they live provides these services. CM spoke with Wells Guiles at [place] and she states they don't provide therapy but they use [facility] for [facility] services. Patient is agreeable to [facility]. CM spoke with [facility] and they accepted the referral. H &P, orders, demographics and last PT eval faxed to [phone] per [facility] request. Patient currently refusing the rolling walker stating he has a walker at home and he wont use it anyway. Bedside RN made aware.  Expected Discharge Date:                  Expected Discharge Plan:  [place]  In-House Referral:     Discharge planning Services  CM Consult  Post Acute Care Choice:    Choice offered to:     DME Arranged:  Walker rolling (Has Gilford Rile) DME Agency:     HH Arranged:  PT, Nurse's Aide [place] Agency:  [place]  Status of Service:  In process, will continue to follow  Medicare Important Message Given:    Date Medicare IM Given:    Medicare IM give by:    Date Additional Medicare IM Given:    Additional Medicare Important Message give by:     If discussed at [place] of Stay Meetings, dates discussed:    Additional Comments:  Pollie Friar, RN 10/15/2014, 2:53 PM

## 2014-10-15 NOTE — Progress Notes (Signed)
*  PRELIMINARY RESULTS* Echocardiogram 2D Echocardiogram has been performed.  [name] 10/15/2014, 9:49 AM

## 2014-10-15 NOTE — Discharge Summary (Addendum)
Physician Discharge Summary  [name] Blatz JFH:[id number] DOB: January 28, 1931 DOA: 10/14/2014  PCP: Mathews Argyle, MD  Admit date: 10/14/2014 Discharge date: 10/15/2014  Time spent: 20 minutes  Recommendations for Outpatient Follow-up:  1. Follow up with PCP in 1-2 weeks 2. Follow up with Dr. Leonie Man in 2 months  Discharge Diagnoses:  Principal Problem:   TIA (transient ischemic attack) Active Problems:   Hypothyroidism (post surgical)   Diabetes mellitus, insulin dependent (IDDM), controlled ([place])   HYPERLIPIDEMIA   Paroxysmal atrial fibrillation (HCC)  CHADVASc = 5   Chronic constipation   Prerenal azotemia   Mild HTN   Discharge Condition: Stable  Diet recommendation: Diabetic, heart healthy  Filed Weights   10/14/14 0345  Weight: 76.204 kg (168 lb)    History of present illness:  Please see admit h and p from 10/3 for details. Briefly, pt presented with fall, mental status change, and facial droop. Patient was admitted for further work up.  Hospital Course:   CVA/TIA [facility]) -Patient was admitted to telemetry floor -Neurology was consulted -MRI/MRA [name] was neg for acute infarct -Echocardiogram with normal EF and no WMA -Carotid duplex was without stenosis -Hemoglobin A1c and lipid panel obtained with LDL of 111, a1c pending -PT/OT/SLP evaluations recs for home PT and rolling walker -Was not on statin prior to admission but was taking WelChol -Discussed with Neurology. Given CHADS of 5, recs for eliquis on discharge   Paroxysmal atrial fibrillation ([place])  -CHADVASc = 5; previously not on anticoagulation and given documentation of dizziness and ataxia in the problem list anticoagulation may have been deferred due to concern over falls -Continued digoxin and calcium channel blocker -Discussed with Neurology. Given CHADS score, recommendation to start eliquis on discharge   Mild HTN -Does not have hypertension listed as chronic problem -Blood pressure has  remained in hypertensive range since arrival -Prior to admission was on calcium channel blocker presumably for rate control with history of paroxysmal atrial fibrillation -If blood pressure not controlled with utilization of calcium channel blocker will need to add other agents -Was also on Lasix prior to admission for unclear reasons; does not have evidence of chronic edema and no apparent history of heart failure were no prior echo for comparison   Hypothyroidism (post surgical) -On very high-dose Synthroid (confirmed with patient) -Check TSH   Diabetes mellitus, insulin dependent (IDDM), controlled (HCC) -Hemoglobin A1c pending per protocol -Continued preadmission Lantus -SSI while inpatient   Prerenal azotemia -Mild and likely secondary to concomitant use of Lasix -Holding Lasix at time of admission   HYPERLIPIDEMIA -Continue WelChol   Chronic constipation -Patient endorses taking daily MiraLAX which has been reordered  Consultations:  Neurology/Stroke Team  Discharge Exam: Filed Vitals:   10/15/14 0000 10/15/14 0528 10/15/14 0934 10/15/14 1425  BP: 115/62 144/89 132/75 131/73  Pulse: 69 71 75 68  Temp: 99 F (37.2 C) 98.6 F (37 C) 98.3 F (36.8 C) 98.2 F (36.8 C)  TempSrc: Oral Oral Oral Oral  Resp: 16 18 20 17   Height:      Weight:      SpO2: 97% 97% 99% 97%    General: awake in nad Cardiovascular: regular, s1, s2 Respiratory: normal resp effort, no wheezing  Discharge Instructions      Discharge Instructions    Ambulatory referral to Neurology    Complete by:  As directed   Please schedule post stroke follow up in 2 months.            Medication  List    STOP taking these medications        doxycycline 100 MG tablet  Commonly known as:  VIBRA-TABS      TAKE these medications        acetaminophen 500 MG tablet  Commonly known as:  TYLENOL  Take 1,000 mg by mouth every 6 (six) hours as needed for mild pain or moderate pain.      apixaban 5 MG Tabs tablet  Commonly known as:  ELIQUIS  Take 1 tablet (5 mg total) by mouth 2 (two) times daily.     benzonatate 100 MG capsule  Commonly known as:  TESSALON  Take 1-2 capsules (100-200 mg total) by mouth every 6 (six) hours as needed for cough.     colesevelam 625 MG tablet  Commonly known as:  WELCHOL  Take 1,250 mg by mouth 3 (three) times daily.     COMBIGAN 0.2-0.5 % ophthalmic solution  Generic drug:  brimonidine-timolol  Place 1 drop into both eyes every 12 (twelve) hours.     DIGITEK 0.125 MG tablet  Generic drug:  digoxin  Take 1 tablet by mouth daily.     diltiazem 240 MG 24 hr capsule  Commonly known as:  CARDIZEM CD  Take 240 mg by mouth every evening.     docusate sodium 100 MG capsule  Commonly known as:  COLACE  Take 100 mg by mouth daily as needed for mild constipation.     furosemide 20 MG tablet  Commonly known as:  LASIX  Take 20 mg by mouth every morning.     glucose 4 GM chewable tablet  Chew 1-2 tablets by mouth as needed for low blood sugar.     insulin regular 100 units/mL injection  Commonly known as:  NOVOLIN R,HUMULIN R  Inject 5-7 Units into the skin 3 (three) times daily before meals. 5-7 units according to sliding scale     LANTUS 100 UNIT/ML injection  Generic drug:  insulin glargine  Inject 10 Units into the skin at bedtime. Takes 10 units if CBG is 250; at 150 takes 5-6 units     OCUVITE EXTRA PO  Take 1 tablet by mouth daily.     omeprazole 20 MG capsule  Commonly known as:  PRILOSEC  Take 20 mg by mouth daily.     polyethylene glycol packet  Commonly known as:  MIRALAX / GLYCOLAX  Take 17 g by mouth daily.     levothyroxine 50 MCG tablet  Commonly known as:  SYNTHROID, LEVOTHROID  Take 50 mcg by mouth daily before breakfast.     SYNTHROID 200 MCG tablet  Generic drug:  levothyroxine  Take 200 mcg by mouth daily before breakfast. Take with 50 mcg tablet     Vitamin D 2000 UNITS tablet  Take 2,000 Units by  mouth daily.     zolpidem 5 MG tablet  Commonly known as:  AMBIEN  Take 1 tablet (5 mg total) by mouth at bedtime as needed for sleep.       Allergies  Allergen Reactions  . Colchicine Anaphylaxis  . Atorvastatin Other (See Comments)    Joint soreness  . Ceftin Diarrhea  . Codeine Hives  . Erythromycin Diarrhea  . Ezetimibe Other (See Comments)    Joint soreness  . Hibiclens [Chlorhexidine] Hives    Cannot use must use ivory soap  . Nitroglycerin Other (See Comments)    lowers blood pressure   . Oxycodone-Acetaminophen Hives  . Rosuvastatin Other (  See Comments)    Joint soreness  . Simvastatin Other (See Comments)    Joint soreness  . Vantin Other (See Comments)    Doesn't remember  . Contrast Media [Iodinated Diagnostic Agents] Nausea And Vomiting and Rash  . Tetanus Toxoid Rash   Follow-up Information    Follow up with Mathews Argyle, MD. Schedule an appointment as soon as possible for a visit in 1 week.   Specialty:  Internal Medicine   Why:  Hospital follow up   Contact information:   [address] [place] [address] [phone]       Follow up with SETHI,PRAMOD, MD In 2 months.   Specialties:  Neurology, Radiology   Why:  Stroke Clinic, Office will call you with appointment date & time   Contact information:   [address] [place] [address] [phone]        The results of significant diagnostics from this hospitalization (including imaging, microbiology, ancillary and laboratory) are listed below for reference.    Significant Diagnostic Studies: Dg Chest 2 View  10/14/2014   CLINICAL DATA:  Stroke  EXAM: CHEST  2 VIEW  COMPARISON:  05/26/2014  FINDINGS: COPD with hyperinflation of the lungs. Negative for infiltrate or effusion. Negative for heart failure. Lungs are clear and unchanged from the prior study.  IMPRESSION: No active cardiopulmonary disease.   Electronically Signed   By: Franchot Gallo M.D.   On:  10/14/2014 08:30   Ct Head Wo Contrast  10/14/2014   CLINICAL DATA:  Slurred speech starting at 10:30.  Facial droop.  EXAM: CT HEAD WITHOUT CONTRAST  CT CERVICAL SPINE WITHOUT CONTRAST  TECHNIQUE: Multidetector CT imaging of the head and cervical spine was performed following the standard protocol without intravenous contrast. Multiplanar CT image reconstructions of the cervical spine were also generated.  COMPARISON:  05/26/2014  FINDINGS: CT HEAD FINDINGS  Skull and Sinuses:Negative for fracture or destructive process. The mastoids, middle ears, and imaged paranasal sinuses are clear.  Orbits: Bilateral cataract resection.  [name]: Stable appearance. No evidence of acute infarction, hemorrhage, hydrocephalus, or mass lesion/mass effect. ASPECTS is 10. Moderate generalized atrophy. Chronic small-vessel ischemic change, mild for age, best seen present in the periventricular, bilateral subinsular, and right thalamus.  Critical Value/emergent results were called by telephone at the time of interpretation on 10/14/2014 at 4:38 am to Dr. Janann Colonel, who verbally acknowledged these results.  CT CERVICAL SPINE FINDINGS  No evidence of acute fracture or traumatic malalignment. No gross canal hematoma or prevertebral edema.  Diffuse facet arthropathy with multi-level ankylosis. Diffuse degenerative disc disease with disc ossification. C7-T1 anterolisthesis is degenerative appearing.  IMPRESSION: 1. No acute intracranial or cervical spine finding. 2. Chronic small vessel disease and moderate atrophy.   Electronically Signed   By: Monte Fantasia M.D.   On: 10/14/2014 04:43   Ct Cervical Spine Wo Contrast  10/14/2014   CLINICAL DATA:  Slurred speech starting at 10:30.  Facial droop.  EXAM: CT HEAD WITHOUT CONTRAST  CT CERVICAL SPINE WITHOUT CONTRAST  TECHNIQUE: Multidetector CT imaging of the head and cervical spine was performed following the standard protocol without intravenous contrast. Multiplanar CT image  reconstructions of the cervical spine were also generated.  COMPARISON:  05/26/2014  FINDINGS: CT HEAD FINDINGS  Skull and Sinuses:Negative for fracture or destructive process. The mastoids, middle ears, and imaged paranasal sinuses are clear.  Orbits: Bilateral cataract resection.  [name]: Stable appearance. No evidence of acute infarction, hemorrhage, hydrocephalus,  or mass lesion/mass effect. ASPECTS is 10. Moderate generalized atrophy. Chronic small-vessel ischemic change, mild for age, best seen present in the periventricular, bilateral subinsular, and right thalamus.  Critical Value/emergent results were called by telephone at the time of interpretation on 10/14/2014 at 4:38 am to Dr. Janann Colonel, who verbally acknowledged these results.  CT CERVICAL SPINE FINDINGS  No evidence of acute fracture or traumatic malalignment. No gross canal hematoma or prevertebral edema.  Diffuse facet arthropathy with multi-level ankylosis. Diffuse degenerative disc disease with disc ossification. C7-T1 anterolisthesis is degenerative appearing.  IMPRESSION: 1. No acute intracranial or cervical spine finding. 2. Chronic small vessel disease and moderate atrophy.   Electronically Signed   By: Monte Fantasia M.D.   On: 10/14/2014 04:43   [name] Wo Contrast  10/14/2014   CLINICAL DATA:  81 year old diabetic male with dyslipidemia and prostate cancer presenting with acute onset of slurred speech, facial droop and weakness with gait instability. Fell walking to bathroom. No reported loss of consciousness. Subsequent encounter.  EXAM: MRI HEAD WITHOUT CONTRAST  MRA HEAD WITHOUT CONTRAST  TECHNIQUE: Multiplanar, multiecho pulse sequences of the [name] and surrounding structures were obtained without intravenous contrast. Angiographic images of the head were obtained using MRA technique without contrast.  COMPARISON:  10/14/2014 head CT.  03/16/2010 [name] William.  FINDINGS: MRI HEAD FINDINGS  No acute infarct.  Remote tiny right thalamic  infarct.  Tiny blood breakdown products right parietal lobe and posterior right operculum region may reflect result of prior hemorrhagic ischemia or remote trauma otherwise no intracranial hemorrhage.  Very mild small vessel disease type changes.  Global atrophy without hydrocephalus.  No intracranial mass lesion noted on this unenhanced exam.  Slightly heterogeneous bone marrow upper cervical spine unchanged.  Post lens replacement.  Orbital structures otherwise unremarkable.  Cervical medullary junction, pituitary region and pineal region unremarkable.  Sub cm left parotid lesion may be a small lymph node.  MRA HEAD FINDINGS  Anterior circulation without medium or large size vessel significant stenosis or occlusion.  Tiny bulge right internal carotid artery supraclinoid segment may be a small infundibulum rather than aneurysm.  Mild narrowing left internal carotid artery supraclinoid segment with post narrowing mild dilation.  Right vertebral artery is dominant. No significant stenosis of the vertebral arteries.  Nonvisualized left posterior inferior cerebellar artery.  Moderate focal narrowing mid aspect of the basilar artery greater on the right.  Right anterior inferior cerebellar artery is not visualized.  Mild to moderate narrowing P1-P2 segment right posterior cerebral artery. Mild posterior cerebral artery branch vessel irregularity bilaterally.  IMPRESSION: MRI HEAD  No acute infarct.  Remote tiny right thalamic infarct.  Tiny blood breakdown products right parietal lobe and posterior right operculum region may reflect result of prior hemorrhagic ischemia or remote trauma otherwise no intracranial hemorrhage.  Very mild small vessel disease type changes.  Global atrophy without hydrocephalus.  MRA HEAD  Anterior circulation without medium or large size vessel significant stenosis or occlusion.  Tiny bulge right internal carotid artery supraclinoid segment may be a small infundibulum rather than aneurysm.   Nonvisualized left posterior inferior cerebellar artery.  Moderate focal narrowing mid aspect of the basilar artery greater on the right.  Right anterior inferior cerebellar artery is not visualized.  Mild to moderate narrowing P1-P2 segment right posterior cerebral artery. Mild posterior cerebral artery branch vessel irregularity bilaterally.   Electronically Signed   By: Genia Del M.D.   On: 10/14/2014 09:29   [name] Wo Contrast  10/14/2014   CLINICAL DATA:  81 year old diabetic male with dyslipidemia and prostate cancer presenting with acute onset of slurred speech, facial droop and weakness with gait instability. Fell walking to bathroom. No reported loss of consciousness. Subsequent encounter.  EXAM: MRI HEAD WITHOUT CONTRAST  MRA HEAD WITHOUT CONTRAST  TECHNIQUE: Multiplanar, multiecho pulse sequences of the [name] and surrounding structures were obtained without intravenous contrast. Angiographic images of the head were obtained using MRA technique without contrast.  COMPARISON:  10/14/2014 head CT.  03/16/2010 [name] William.  FINDINGS: MRI HEAD FINDINGS  No acute infarct.  Remote tiny right thalamic infarct.  Tiny blood breakdown products right parietal lobe and posterior right operculum region may reflect result of prior hemorrhagic ischemia or remote trauma otherwise no intracranial hemorrhage.  Very mild small vessel disease type changes.  Global atrophy without hydrocephalus.  No intracranial mass lesion noted on this unenhanced exam.  Slightly heterogeneous bone marrow upper cervical spine unchanged.  Post lens replacement.  Orbital structures otherwise unremarkable.  Cervical medullary junction, pituitary region and pineal region unremarkable.  Sub cm left parotid lesion may be a small lymph node.  MRA HEAD FINDINGS  Anterior circulation without medium or large size vessel significant stenosis or occlusion.  Tiny bulge right internal carotid artery supraclinoid segment may be a small infundibulum  rather than aneurysm.  Mild narrowing left internal carotid artery supraclinoid segment with post narrowing mild dilation.  Right vertebral artery is dominant. No significant stenosis of the vertebral arteries.  Nonvisualized left posterior inferior cerebellar artery.  Moderate focal narrowing mid aspect of the basilar artery greater on the right.  Right anterior inferior cerebellar artery is not visualized.  Mild to moderate narrowing P1-P2 segment right posterior cerebral artery. Mild posterior cerebral artery branch vessel irregularity bilaterally.  IMPRESSION: MRI HEAD  No acute infarct.  Remote tiny right thalamic infarct.  Tiny blood breakdown products right parietal lobe and posterior right operculum region may reflect result of prior hemorrhagic ischemia or remote trauma otherwise no intracranial hemorrhage.  Very mild small vessel disease type changes.  Global atrophy without hydrocephalus.  MRA HEAD  Anterior circulation without medium or large size vessel significant stenosis or occlusion.  Tiny bulge right internal carotid artery supraclinoid segment may be a small infundibulum rather than aneurysm.  Nonvisualized left posterior inferior cerebellar artery.  Moderate focal narrowing mid aspect of the basilar artery greater on the right.  Right anterior inferior cerebellar artery is not visualized.  Mild to moderate narrowing P1-P2 segment right posterior cerebral artery. Mild posterior cerebral artery branch vessel irregularity bilaterally.   Electronically Signed   By: Genia Del M.D.   On: 10/14/2014 09:29    Microbiology: No results found for this or any previous visit (from the past 240 hour(s)).   Labs: Basic Metabolic Panel:  Recent Labs Lab 10/14/14 0354 10/14/14 0402 10/15/14 0644  NA 137 140 134*  K 3.8 3.7 3.8  CL 101 104 100*  CO2 26  --  28  GLUCOSE 114* 106* 111*  BUN 19 22* 16  CREATININE 1.07 1.20 1.15  CALCIUM 8.9  --  8.8*   Liver Function Tests:  Recent  Labs Lab 10/14/14 0354  AST 16  ALT 11*  ALKPHOS 58  BILITOT 0.9  PROT 6.1*  ALBUMIN 3.8   No results for input(s): LIPASE, AMYLASE in the last 168 hours. No results for input(s): AMMONIA in the last 168 hours. CBC:  Recent Labs Lab 10/14/14 0354 10/14/14 0402  WBC 8.5  --  NEUTROABS 6.5  --   HGB 14.2 15.0  HCT 43.1 44.0  MCV 91.1  --   PLT 209  --    Cardiac Enzymes: No results for input(s): CKTOTAL, CKMB, CKMBINDEX, TROPONINI in the last 168 hours. BNP: BNP (last 3 results) No results for input(s): BNP in the last 8760 hours.  ProBNP (last 3 results) No results for input(s): PROBNP in the last 8760 hours.  CBG:  Recent Labs Lab 10/14/14 0936 10/14/14 1620 10/14/14 2144 10/15/14 0614 10/15/14 1153  GLUCAP 127* 262* 123* 110* 99     Signed:  CHIU, STEPHEN K  [facility] 10/15/2014, 4:10 PM

## 2014-10-15 NOTE — Progress Notes (Signed)
STROKE TEAM PROGRESS NOTE   SUBJECTIVE (INTERVAL HISTORY) wife at beside. Pt up in chair at the bedside. No new complaints.   OBJECTIVE Temp:  [97.9 F (36.6 C)-99 F (37.2 C)] 98.3 F (36.8 C) (10/03 0934) Pulse Rate:  [69-86] 75 (10/03 0934) Cardiac Rhythm:  [-] Normal sinus rhythm;Heart block (10/03 0825) Resp:  [12-23] 20 (10/03 0934) BP: (115-176)/(62-89) 132/75 mmHg (10/03 0934) SpO2:  [97 %-100 %] 99 % (10/03 0934)  CBC:   Recent Labs Lab 10/14/14 0354 10/14/14 0402  WBC 8.5  --   NEUTROABS 6.5  --   HGB 14.2 15.0  HCT 43.1 44.0  MCV 91.1  --   PLT 209  --     Basic Metabolic Panel:   Recent Labs Lab 10/14/14 0354 10/14/14 0402 10/15/14 0644  NA 137 140 134*  K 3.8 3.7 3.8  CL 101 104 100*  CO2 26  --  28  GLUCOSE 114* 106* 111*  BUN 19 22* 16  CREATININE 1.07 1.20 1.15  CALCIUM 8.9  --  8.8*    Lipid Panel:     Component Value Date/Time   CHOL 181 10/15/2014 0644   TRIG 110 10/15/2014 0644   HDL 48 10/15/2014 0644   CHOLHDL 3.8 10/15/2014 0644   VLDL 22 10/15/2014 0644   LDLCALC 111* 10/15/2014 0644   HgbA1c:  Lab Results  Component Value Date   HGBA1C 6.9* 04/02/2011   Urine Drug Screen: No results found for: LABOPIA, COCAINSCRNUR, LABBENZ, AMPHETMU, THCU, LABBARB    IMAGING  Ct Head Wo Contrast 10/14/2014    1. No acute intracranial or cervical spine finding.  2. Chronic small vessel disease and moderate atrophy.      Ct Cervical Spine Wo Contrast 10/14/2014    1. No acute intracranial or cervical spine finding.  2. Chronic small vessel disease and moderate atrophy.    04:43   MRI Head / MRA Brain 10/14/2014  MRI  No acute infarct. Remote tiny right thalamic infarct. Tiny blood breakdown products right parietal lobe and posterior right operculum region may reflect result of prior hemorrhagic ischemia or remote trauma otherwise no intracranial hemorrhage. Very mild small vessel disease type changes. Global atrophy without  hydrocephalus.  MRA Anterior circulation without medium or large size vessel significant stenosis or occlusion. Tiny bulge right internal carotid artery supraclinoid segment may be a small infundibulum rather than aneurysm. Nonvisualized left posterior inferior cerebellar artery. Moderate focal narrowing mid aspect of the basilar artery greater on the right. Right anterior inferior cerebellar artery is not visualized. Mild to moderate narrowing P1-P2 segment right posterior cerebral artery. Mild posterior cerebral artery branch vessel irregularity bilaterally.  Carotid Doppler  There is 1-39% bilateral ICA stenosis. Vertebral artery flow is antegrade.    2D Echocardiogram  EF 55-60% with no source of embolus.    PHYSICAL EXAM Pleasant elderly Caucasian male not in distress. . Afebrile. Head is nontraumatic. Neck is supple without bruit.    Cardiac exam no murmur or gallop. Lungs are clear to auscultation. Distal pulses are well felt. Neurological Exam ;  Awake  Alert oriented x 3. Normal speech and language.eye movements full without nystagmus.fundi were not visualized. Vision acuity and fields appear normal. Hearing is normal. Palatal movements are normal. Face symmetric. Tongue midline. Normal strength, tone, reflexes and coordination. Normal sensation. Gait deferred.   ASSESSMENT/PLAN [name] is a 81 y.o. male with history of dyslipidemia, diabetes mellitus, dysrhythmia, prostate cancer, and recent DVT  presenting  with slurred speech, right facial droop, and gait instability. He did not receive IV t-PA due to minimal deficits and late presentation.  Possible L brain TIA  Resultant  resolution of deficits  MRI  No acute infarct. Remote tiny right thalamic infarct.  MRA  - Nonvisualized left posterior inferior cerebellar artery. Otherwise no abnormalities.  Carotid Doppler No significant stenosis   2D Echo No significant stenosis   LDL 111  HgbA1c pending   VTE  prophylaxis - Lovenox Diet heart healthy/carb modified Room service appropriate?: Yes; Fluid consistency:: Thin  no antithrombotic prior to admission, now on aspirin 325 mg orally every day.   Patient counseled to be compliant with his antithrombotic medications  Ongoing aggressive stroke risk factor management  Therapy recommendations: HHPT  Disposition: home with [place] for discharge from stroke standpoint   Follow up Dr. Leonie Man stroke clinic in 2 months. Order written.  Paroxysmal Atrial Fibrillation  Home anticoagulation:  none , ? Related to fall risk  CHA2DS2-VASc Score = 5  Given TIA, oral anticoagulation recommended (Eliquis). If pt does not want to take, continue aspirin.  Hypertension  Stable  Hyperlipidemia  Home meds: WelChol 1250 mg 3 times daily - resumed in hospital  LDL 111, goal < 70  Continue welchol at discharge (intolerant to statins)  Diabetes type 2  HgbA1c pending, goal < 7.0  Controlled  Other Stroke Risk Factors  Advanced age  Hx stroke/TIA - remote tiny infarct by MRI. Hx possible small brain stem/right cerebellar infarct d/t SVD 03/2010 Leonie Man)  Family hx stroke (mother)  Other Active Problems  Possible Parfait Trial candidate  History of DVT with recent Coumadin therapy  Hospital day # [place] for Pager information 10/15/2014 2:31 PM  I have personally examined this patient, reviewed notes, independently viewed imaging studies, participated in medical decision making and plan of care. I have made any additions or clarifications directly to the above note. Agree with note above. Patient has prior history of paroxysmal atrial fibrillation and given his recent TIA and high chads 2  vasc score will benefit from long-term anticoagulation with eliquis.  Antony Contras, MD Medical Director [facility] Pager: [phone] 10/15/2014 2:58 PM   To contact Stroke Continuity  provider, please refer to [email or web address] After hours, contact General Neurology

## 2014-10-15 NOTE — Discharge Planning (Signed)
[facility]. Discharge instructions given to both the patient and his wife. Instructions included stroke education, medications, and prevention. All belongings and his new prescription sent with the patient and his wife. Left the unit via wheelchair by nurse and NT.

## 2014-10-15 NOTE — Progress Notes (Signed)
*  PRELIMINARY RESULTS* Vascular Ultrasound Carotid Duplex (Doppler) has been completed.  Preliminary findings: Bilateral: No significant (1-39%) ICA stenosis. Antegrade vertebral flow.    Landry Mellow, RDMS, RVT  10/15/2014, 12:32 PM

## 2014-10-16 ENCOUNTER — Telehealth: Payer: Self-pay | Admitting: Neurology

## 2014-10-16 LAB — HEMOGLOBIN A1C
Hgb A1c MFr Bld: 6.2 % — ABNORMAL HIGH (ref 4.8–5.6)
MEAN PLASMA GLUCOSE: 131 mg/dL

## 2014-10-16 NOTE — Telephone Encounter (Signed)
Pt's wife called and states pt was seen in the hospital by Dr. Leonie Man. He was given rx for apixaban (ELIQUIS) 5 MG TABS tablet. They can not afford this even though it is the generic form. Please call and advise [phone]

## 2014-10-16 NOTE — Progress Notes (Signed)
CM spoke with Agricultural consultant, who states that patient was discharged home on Eliquis, but was unable to afford the copay.  CM spoke with discharging MD, Dr Rhetta Mura, who asked that CM provide a 30 day free trial card and instruct patient to follow-up with his PCP for an alternative medication to initiate after the 30 day period.  CM spoke with the pharmacist at [facility] [phone] to provide the numbers for the 30 day free trial.  CM spoke with patient's wife via phone to provide instruction. Per wife, patient has an appointment with his PCP scheduled for 10/18/14.  Lorne Skeens RN, MSN [phone]

## 2014-10-17 DIAGNOSIS — Z9181 History of falling: Secondary | ICD-10-CM | POA: Diagnosis not present

## 2014-10-17 DIAGNOSIS — H3530 Unspecified macular degeneration: Secondary | ICD-10-CM | POA: Diagnosis not present

## 2014-10-17 DIAGNOSIS — Z86718 Personal history of other venous thrombosis and embolism: Secondary | ICD-10-CM | POA: Diagnosis not present

## 2014-10-17 DIAGNOSIS — E039 Hypothyroidism, unspecified: Secondary | ICD-10-CM | POA: Diagnosis not present

## 2014-10-17 DIAGNOSIS — Z794 Long term (current) use of insulin: Secondary | ICD-10-CM | POA: Diagnosis not present

## 2014-10-17 DIAGNOSIS — N183 Chronic kidney disease, stage 3 (moderate): Secondary | ICD-10-CM | POA: Diagnosis not present

## 2014-10-17 DIAGNOSIS — E1122 Type 2 diabetes mellitus with diabetic chronic kidney disease: Secondary | ICD-10-CM | POA: Diagnosis not present

## 2014-10-17 DIAGNOSIS — I480 Paroxysmal atrial fibrillation: Secondary | ICD-10-CM | POA: Diagnosis not present

## 2014-10-17 DIAGNOSIS — R2689 Other abnormalities of gait and mobility: Secondary | ICD-10-CM | POA: Diagnosis not present

## 2014-10-17 DIAGNOSIS — H9193 Unspecified hearing loss, bilateral: Secondary | ICD-10-CM | POA: Diagnosis not present

## 2014-10-17 DIAGNOSIS — Z8673 Personal history of transient ischemic attack (TIA), and cerebral infarction without residual deficits: Secondary | ICD-10-CM | POA: Diagnosis not present

## 2014-10-17 DIAGNOSIS — I129 Hypertensive chronic kidney disease with stage 1 through stage 4 chronic kidney disease, or unspecified chronic kidney disease: Secondary | ICD-10-CM | POA: Diagnosis not present

## 2014-10-17 DIAGNOSIS — Z974 Presence of external hearing-aid: Secondary | ICD-10-CM | POA: Diagnosis not present

## 2014-10-17 NOTE — Telephone Encounter (Signed)
Spoke with pt. He cannot afford eliquis at this time. He is wondering if there is another medication similar to eliquis but cheaper. He states that he has been on coumadin before.  I advised pt that I would send this message to Dr. Leonie Man and we would call pt back with recommendations.

## 2014-10-17 NOTE — Telephone Encounter (Signed)
I spoke to the patient's wife. Unfortunately patient is unable to afford 20% cost that his insurance plan Makes him pay for his medicines and hence all the new anti-coagulants are going to be quite expensive. He wants to switch to warfarin and I agree. He has an appointment tomorrow with his primary physician Dr. Felipa Eth I recommend that he discuss this with him and start warfarin and stop eliquis and have regular INR checks at his office

## 2014-10-18 DIAGNOSIS — G459 Transient cerebral ischemic attack, unspecified: Secondary | ICD-10-CM | POA: Diagnosis not present

## 2014-10-18 DIAGNOSIS — E1122 Type 2 diabetes mellitus with diabetic chronic kidney disease: Secondary | ICD-10-CM | POA: Diagnosis not present

## 2014-10-18 DIAGNOSIS — N183 Chronic kidney disease, stage 3 (moderate): Secondary | ICD-10-CM | POA: Diagnosis not present

## 2014-10-18 DIAGNOSIS — I129 Hypertensive chronic kidney disease with stage 1 through stage 4 chronic kidney disease, or unspecified chronic kidney disease: Secondary | ICD-10-CM | POA: Diagnosis not present

## 2014-10-18 DIAGNOSIS — Z794 Long term (current) use of insulin: Secondary | ICD-10-CM | POA: Diagnosis not present

## 2014-10-29 DIAGNOSIS — H5201 Hypermetropia, right eye: Secondary | ICD-10-CM | POA: Diagnosis not present

## 2014-10-29 DIAGNOSIS — Z9849 Cataract extraction status, unspecified eye: Secondary | ICD-10-CM | POA: Diagnosis not present

## 2014-10-29 DIAGNOSIS — H3530 Unspecified macular degeneration: Secondary | ICD-10-CM | POA: Diagnosis not present

## 2014-10-29 DIAGNOSIS — H52223 Regular astigmatism, bilateral: Secondary | ICD-10-CM | POA: Diagnosis not present

## 2014-10-29 DIAGNOSIS — Z961 Presence of intraocular lens: Secondary | ICD-10-CM | POA: Diagnosis not present

## 2014-10-29 DIAGNOSIS — H524 Presbyopia: Secondary | ICD-10-CM | POA: Diagnosis not present

## 2014-10-31 NOTE — Plan of Care (Addendum)
Post discharge had communication with PCP and subsequent review of EMR. Pt w/ prior history of non specified rapid rhythm 40 years prior that required digoxin. More recently he had experienced neurological symptoms that initially were suspected to be rhythm related but ultimately determined to be due to Lantus related hypoglycemia. In summary this patient does not carry a diagnosis of atrial fib or PAF.  Erin Hearing, ANP

## 2014-11-01 DIAGNOSIS — H353231 Exudative age-related macular degeneration, bilateral, with active choroidal neovascularization: Secondary | ICD-10-CM | POA: Diagnosis not present

## 2014-11-01 DIAGNOSIS — Z961 Presence of intraocular lens: Secondary | ICD-10-CM | POA: Diagnosis not present

## 2014-11-01 DIAGNOSIS — E119 Type 2 diabetes mellitus without complications: Secondary | ICD-10-CM | POA: Diagnosis not present

## 2014-11-12 DIAGNOSIS — C61 Malignant neoplasm of prostate: Secondary | ICD-10-CM | POA: Diagnosis not present

## 2014-11-19 DIAGNOSIS — C61 Malignant neoplasm of prostate: Secondary | ICD-10-CM | POA: Diagnosis not present

## 2014-11-19 DIAGNOSIS — N3941 Urge incontinence: Secondary | ICD-10-CM | POA: Diagnosis not present

## 2014-11-19 DIAGNOSIS — T66XXXS Radiation sickness, unspecified, sequela: Secondary | ICD-10-CM | POA: Diagnosis not present

## 2014-11-19 DIAGNOSIS — N3281 Overactive bladder: Secondary | ICD-10-CM | POA: Diagnosis not present

## 2014-12-03 ENCOUNTER — Encounter: Payer: Self-pay | Admitting: Neurology

## 2014-12-03 ENCOUNTER — Ambulatory Visit (INDEPENDENT_AMBULATORY_CARE_PROVIDER_SITE_OTHER): Payer: Medicare Other | Admitting: Neurology

## 2014-12-03 VITALS — BP 122/74 | HR 59 | Ht 69.0 in | Wt 166.4 lb

## 2014-12-03 DIAGNOSIS — I639 Cerebral infarction, unspecified: Secondary | ICD-10-CM | POA: Diagnosis not present

## 2014-12-03 DIAGNOSIS — G451 Carotid artery syndrome (hemispheric): Secondary | ICD-10-CM | POA: Diagnosis not present

## 2014-12-03 NOTE — Progress Notes (Signed)
[facility] [address] [place]. [place] [address] [phone]       OFFICE FOLLOW-UP NOTE  Mr. [name] Date of Birth:  01/29/1931 Medical Record Number:  [id number]   HPI: [name] is a 81 year old pleasant Caucasian male seen today for first office follow-up visit following hospital admission for TIA in October 2016.MARQAVIOUS [name] is an 81 y.o. male hx of HLD, DM presenting with acute onset of slurred speech, facial droop, weakness and gait instability. LSW 2230 on 10/01. Around 0230 he awoke and noted difficulty speaking, had trouble getting the words out and wife noted he was slurring his speech. Stood up to use the restroom and noted he was weak. Unsure if unilateral or generalized. Fell walking to the bathroom. Upon EMS arrival they noted he had a facial droop but was otherwise asymptomatic. Noted to be hypertensive upon arrival to ED with BP of 182/88. Denies any prior CVA or TIA. was not on any antiplatelet at home. CT head imaging reviewed and no acute process noted. Initial NIHSS of 1 for right facial droop.  Date last known well: 10/13/2014 Time last known well: 2230 tPA Given: no, out of IV tPA window Modified Rankin: Rankin Score=1.. MRI scan of the brain showed no acute infarct and MRA of the brain showed no large vessel intracranial stenosis. Carotid ultrasound showed no significant extracranial stenosis. Transthoracic echo showed normal ejection fraction. Hemoglobin 1106.2. LDL cholesterol was elevated 111. Patient had a history of statin intolerance and hence was not started on statin and continued on WelChol. Patient states his done well since discharge he is back to his baseline. His blood pressure is well controlled and today it is 1-2/74. He continues to have problems controlling his sugars and fasting sugars have ranged from 120140 range and Dr. Felipa Eth has recently changed his insulin. He is tolerating aspirin 81 mg daily living except for skin  bruising but no bleeding. Patient had history of paroxysmal atrial fibrillation and a high CHAD2VASC score hence was recommended eliquis but he never took it and wants to stay on aspirin  ROS:   14 system review of systems is positive for  hearing loss, trouble swallowing, cough, urination problems, incontinence, importance, easy bruising and bleeding, joint pain, aching muscles, allergies, difficulty swallowing and all other systems negative  PMH:  Past Medical History  Diagnosis Date  . History of skin cancer   . Hypothyroidism   . Asthmatic bronchitis   . Allergic rhinitis   . Urticaria   . Dyslipidemia   . DM type 2 (diabetes mellitus, type 2) ([place])   . Macular degeneration     injections done bilaterally recent - 08-22-13  . Dysrhythmia     '97 tachycardia- no recent issues  . Arthritis     arthritis-kyphosis  . GERD (gastroesophageal reflux disease)   . Prostate cancer [facility])     Prostate cancer-radiation only,skin cancer-basal cell and last squamous cell right eye  . Urgency-frequency syndrome   . History of kidney stones   . Hearing impaired     bilateral hearing aids  . DVT, lower extremity, proximal, acute ([place])   . Stroke [facility])     Social History:  Social History   Social History  . Marital Status: Married    Spouse Name: N/A  . Number of Children: 2  . Years of Education: N/A   Occupational History  . retired    Social History Main Topics  . Smoking status: Never Smoker   .  Smokeless tobacco: Never Used  . Alcohol Use: No  . Drug Use: No  . Sexual Activity: Not Currently   Other Topics Concern  . Not on file   Social History Narrative   MUST HAVE MEDS LISTED DO NOT SUBSTITUTE GENERICS PATIENT IS ALLERGIC TO DYES    Medications:   Current Outpatient Prescriptions on File Prior to Visit  Medication Sig Dispense Refill  . acetaminophen (TYLENOL) 500 MG tablet Take 1,000 mg by mouth every 6 (six) hours as needed for mild pain or moderate pain.    .  benzonatate (TESSALON) 100 MG capsule Take 1-2 capsules (100-200 mg total) by mouth every 6 (six) hours as needed for cough. 50 capsule 0  . Cholecalciferol (VITAMIN D) 2000 UNITS tablet Take 5,000 Units by mouth daily.     . colesevelam (WELCHOL) 625 MG tablet Take 1,250 mg by mouth 3 (three) times daily.    . COMBIGAN 0.2-0.5 % ophthalmic solution Place 1 drop into both eyes every 12 (twelve) hours.   0  . DIGITEK 125 MCG tablet Take 1 tablet by mouth daily.  0  . diltiazem (CARDIZEM CD) 240 MG 24 hr capsule Take 240 mg by mouth every evening.    . docusate sodium (COLACE) 100 MG capsule Take 100 mg by mouth daily as needed for mild constipation.    . furosemide (LASIX) 20 MG tablet Take 20 mg by mouth every morning.     Marland Kitchen glucose 4 GM chewable tablet Chew 1-2 tablets by mouth as needed for low blood sugar.    . insulin regular (NOVOLIN R,HUMULIN R) 100 units/mL injection Inject 5-7 Units into the skin 3 (three) times daily before meals. 5-7 units according to sliding scale    . levothyroxine (SYNTHROID) 200 MCG tablet Take 200 mcg by mouth daily before breakfast. Take with 50 mcg tablet    . levothyroxine (SYNTHROID, LEVOTHROID) 50 MCG tablet Take 50 mcg by mouth daily before breakfast.    . Multiple Vitamins-Minerals (OCUVITE EXTRA PO) Take 1 tablet by mouth daily.     Marland Kitchen omeprazole (PRILOSEC) 20 MG capsule Take 20 mg by mouth daily.    . polyethylene glycol (MIRALAX / GLYCOLAX) packet Take 17 g by mouth daily.    Marland Kitchen zolpidem (AMBIEN) 5 MG tablet Take 1 tablet (5 mg total) by mouth at bedtime as needed for sleep. 30 tablet 5   No current facility-administered medications on file prior to visit.    Allergies:   Allergies  Allergen Reactions  . Colchicine Anaphylaxis  . Atorvastatin Other (See Comments)    Joint soreness  . Ceftin Diarrhea  . Codeine Hives  . Erythromycin Diarrhea  . Ezetimibe Other (See Comments)    Joint soreness  . Hibiclens [Chlorhexidine] Hives    Cannot use must  use ivory soap  . Nitroglycerin Other (See Comments)    lowers blood pressure   . Oxycodone-Acetaminophen Hives  . Rosuvastatin Other (See Comments)    Joint soreness  . Simvastatin Other (See Comments)    Joint soreness  . Vantin Other (See Comments)    Doesn't remember  . Contrast Media [Iodinated Diagnostic Agents] Nausea And Vomiting and Rash  . Tetanus Toxoid Rash    Physical Exam General: Frail elderly Caucasian male, seated, in no evident distress Head: head normocephalic and atraumatic.  Neck: supple with no carotid or supraclavicular bruits Cardiovascular: regular rate and rhythm, no murmurs Musculoskeletal: no deformity. Mild kyphosis Skin:  no rash but scattered petichiae Vascular:  Normal pulses all extremities Filed Vitals:   12/03/14 1344  BP: 122/74  Pulse: 59   Neurologic Exam Mental Status: Awake and fully alert. Oriented to place and time. Recent and remote memory intact. Attention span, concentration and fund of knowledge appropriate. Mood and affect appropriate.  Cranial Nerves: Fundoscopic exam reveals sharp disc margins. Pupils equal, briskly reactive to light. Extraocular movements full without nystagmus. Visual fields full to confrontation. Hearing diminished on the right. Facial sensation intact. Face, tongue, palate moves normally and symmetrically.  Motor: Normal bulk and tone. Normal strength in all tested extremity muscles. Sensory.: intact to touch ,pinprick .position and vibratory sensation.  Coordination: Rapid alternating movements normal in all extremities. Finger-to-nose and heel-to-shin performed accurately bilaterally. Gait and Station: Arises from chair without difficulty. Stance is normal. Gait demonstrates normal stride length and balance . Able to heel, toe and tandem walk without difficulty.  Reflexes: 1+ and symmetric. Toes downgoing.   NIHSS  0 Modified Rankin 0   ASSESSMENT: 53 year Caucasian male with left hemispheric TIA in  October 2016 likely due to small vessel disease versus  AFIB with multiple vascular risk factors of diabetes, hyperlipidemia, paroxysmal atrial fibrillation age and sex.    PLAN: I had a long d/w patient about his recent TIA risk for recurrent stroke/TIAs, personally independently reviewed imaging studies and stroke evaluation results and answered questions.Continue aspirin 81 mg daily  for secondary stroke prevention  as he does not want to be on eliquis and maintain strict control of hypertension with blood pressure goal below 130/90, diabetes with hemoglobin A1c goal below 6.5% and lipids with LDL cholesterol goal below 70 mg/dL. I also advised the patient to eat a healthy diet with plenty of whole grains, cereals, fruits and vegetables, exercise regularly and maintain ideal body weight .he was also advised to discuss with his primary physician changing well called to the new PCSK 9 inhibitors as he has history of statin intolerance and his LDL is not at [place] than 50% of time during this 25 minute visit was spent on counseling,explanation of diagnosis, planning of further management, discussion with patient and family and coordination of care  Followup in the future with me in 6 months or call earlier if necessary.  Lesli Albee, MD  Note: This document was prepared with digital dictation and possible smart phrase technology. Any transcriptional errors that result from this process are unintentional

## 2014-12-03 NOTE — Patient Instructions (Signed)
I had a long d/w patient about his recent TIA risk for recurrent stroke/TIAs, personally independently reviewed imaging studies and stroke evaluation results and answered questions.Continue aspirin 81 mg daily  for secondary stroke prevention and maintain strict control of hypertension with blood pressure goal below 130/90, diabetes with hemoglobin A1c goal below 6.5% and lipids with LDL cholesterol goal below 70 mg/dL. I also advised the patient to eat a healthy diet with plenty of whole grains, cereals, fruits and vegetables, exercise regularly and maintain ideal body weight .he was also advised to discuss with his primary physician changing well called to the new PCSK 9 inhibitors as he has history of statin intolerance and his LDL is not at goal. Followup in the future with me in 6 months or call earlier if necessary.  Thank you for coming to see [place] at [facility]. I hope we have been able to provide you high quality care today. You may receive a patient satisfaction survey over the next few weeks. We would appreciate your feedback and comments so that we may continue to improve ourselves and the health of our patients.

## 2014-12-20 DIAGNOSIS — N183 Chronic kidney disease, stage 3 (moderate): Secondary | ICD-10-CM | POA: Diagnosis not present

## 2014-12-20 DIAGNOSIS — E1122 Type 2 diabetes mellitus with diabetic chronic kidney disease: Secondary | ICD-10-CM | POA: Diagnosis not present

## 2014-12-20 DIAGNOSIS — Z794 Long term (current) use of insulin: Secondary | ICD-10-CM | POA: Diagnosis not present

## 2014-12-20 DIAGNOSIS — I129 Hypertensive chronic kidney disease with stage 1 through stage 4 chronic kidney disease, or unspecified chronic kidney disease: Secondary | ICD-10-CM | POA: Diagnosis not present

## 2014-12-20 DIAGNOSIS — E7800 Pure hypercholesterolemia, unspecified: Secondary | ICD-10-CM | POA: Diagnosis not present

## 2014-12-25 DIAGNOSIS — J209 Acute bronchitis, unspecified: Secondary | ICD-10-CM | POA: Diagnosis not present

## 2015-01-01 DIAGNOSIS — H353231 Exudative age-related macular degeneration, bilateral, with active choroidal neovascularization: Secondary | ICD-10-CM | POA: Diagnosis not present

## 2015-01-03 DIAGNOSIS — M545 Low back pain: Secondary | ICD-10-CM | POA: Diagnosis not present

## 2015-01-03 DIAGNOSIS — M47816 Spondylosis without myelopathy or radiculopathy, lumbar region: Secondary | ICD-10-CM | POA: Diagnosis not present

## 2015-01-08 ENCOUNTER — Telehealth: Payer: Self-pay | Admitting: Internal Medicine

## 2015-01-08 MED ORDER — AZITHROMYCIN 250 MG PO TABS: ORAL_TABLET | ORAL | Status: DC

## 2015-01-08 NOTE — Telephone Encounter (Signed)
I think he would tolerate a Z-Pak okay. Erythromycin causes diarrhea but that shouldn't be a problem with zpak

## 2015-01-08 NOTE — Telephone Encounter (Signed)
Spoke with pt's wife. States that the pt has been battling a cough since 12/25/14. Was given Doxy by his PCP but cough has returned. Cough is producing light yellow mucus. Denies fever, SOB, chest tightness or wheezing. Would like CY's recommendations. CY - please advise.  Thanks.  Allergies  Allergen Reactions  . Colchicine Anaphylaxis  . Atorvastatin Other (See Comments)    Joint soreness  . Ceftin Diarrhea  . Codeine Hives  . Erythromycin Diarrhea  . Ezetimibe Other (See Comments)    Joint soreness  . Hibiclens [Chlorhexidine] Hives    Cannot use must use ivory soap  . Nitroglycerin Other (See Comments)    lowers blood pressure   . Oxycodone-Acetaminophen Hives  . Rosuvastatin Other (See Comments)    Joint soreness  . Simvastatin Other (See Comments)    Joint soreness  . Vantin Other (See Comments)    Doesn't remember  . Contrast Media [Iodinated Diagnostic Agents] Nausea And Vomiting and Rash  . Tetanus Toxoid Rash   Current Outpatient Prescriptions on File Prior to Visit  Medication Sig Dispense Refill  . acetaminophen (TYLENOL) 500 MG tablet Take 1,000 mg by mouth every 6 (six) hours as needed for mild pain or moderate pain.    Marland Kitchen albuterol (PROAIR HFA) 108 (90 BASE) MCG/ACT inhaler     . aspirin EC 81 MG tablet Take 81 mg by mouth.    . benzonatate (TESSALON) 100 MG capsule Take 1-2 capsules (100-200 mg total) by mouth every 6 (six) hours as needed for cough. 50 capsule 0  . Cholecalciferol (VITAMIN D) 2000 UNITS tablet Take 5,000 Units by mouth daily.     . colesevelam (WELCHOL) 625 MG tablet Take 1,250 mg by mouth 3 (three) times daily.    . COMBIGAN 0.2-0.5 % ophthalmic solution Place 1 drop into both eyes every 12 (twelve) hours.   0  . DIGITEK 125 MCG tablet Take 1 tablet by mouth daily.  0  . diltiazem (CARDIZEM CD) 240 MG 24 hr capsule Take 240 mg by mouth every evening.    . docusate sodium (COLACE) 100 MG capsule Take 100 mg by mouth daily as needed for mild  constipation.    Marland Kitchen FLUZONE HIGH-DOSE 0.5 ML SUSY inject 0.5 milliliter intramuscularly  0  . furosemide (LASIX) 20 MG tablet Take 20 mg by mouth every morning.     Marland Kitchen glucose 4 GM chewable tablet Chew 1-2 tablets by mouth as needed for low blood sugar.    . insulin regular (NOVOLIN R,HUMULIN R) 100 units/mL injection Inject 5-7 Units into the skin 3 (three) times daily before meals. 5-7 units according to sliding scale    . levothyroxine (SYNTHROID) 200 MCG tablet Take 200 mcg by mouth daily before breakfast. Take with 50 mcg tablet    . levothyroxine (SYNTHROID, LEVOTHROID) 50 MCG tablet Take 50 mcg by mouth daily before breakfast.    . Multiple Vitamins-Minerals (OCUVITE EXTRA PO) Take 1 tablet by mouth daily.     Marland Kitchen omeprazole (PRILOSEC) 20 MG capsule Take 20 mg by mouth daily.    . polyethylene glycol (MIRALAX / GLYCOLAX) packet Take 17 g by mouth daily.    Marland Kitchen zolpidem (AMBIEN) 5 MG tablet Take 1 tablet (5 mg total) by mouth at bedtime as needed for sleep. 30 tablet 5   No current facility-administered medications on file prior to visit.

## 2015-01-08 NOTE — Telephone Encounter (Signed)
Called spoke with pt spouse. Aware of recs. RX sent in. Nothing further needed

## 2015-02-04 ENCOUNTER — Encounter: Payer: Self-pay | Admitting: Internal Medicine

## 2015-02-04 ENCOUNTER — Ambulatory Visit (INDEPENDENT_AMBULATORY_CARE_PROVIDER_SITE_OTHER): Payer: Medicare Other | Admitting: Internal Medicine

## 2015-02-04 VITALS — BP 112/62 | HR 72 | Ht 71.0 in | Wt 166.0 lb

## 2015-02-04 DIAGNOSIS — G4700 Insomnia, unspecified: Secondary | ICD-10-CM | POA: Diagnosis not present

## 2015-02-04 DIAGNOSIS — J309 Allergic rhinitis, unspecified: Secondary | ICD-10-CM

## 2015-02-04 DIAGNOSIS — J302 Other seasonal allergic rhinitis: Secondary | ICD-10-CM

## 2015-02-04 DIAGNOSIS — J3089 Other allergic rhinitis: Secondary | ICD-10-CM

## 2015-02-04 DIAGNOSIS — J208 Acute bronchitis due to other specified organisms: Secondary | ICD-10-CM

## 2015-02-04 DIAGNOSIS — J209 Acute bronchitis, unspecified: Secondary | ICD-10-CM

## 2015-02-04 MED ORDER — ZOLPIDEM TARTRATE 5 MG PO TABS: 5.0000 mg | ORAL_TABLET | Freq: Every evening | ORAL | Status: DC | PRN

## 2015-02-04 MED ORDER — METHYLPREDNISOLONE ACETATE 80 MG/ML IJ SUSP
80.0000 mg | Freq: Once | INTRAMUSCULAR | Status: AC
  Administered 2015-02-04: 80 mg via INTRAMUSCULAR

## 2015-02-04 NOTE — Patient Instructions (Signed)
Neb neo nasal  Depo 80  Script refill [facility]

## 2015-02-04 NOTE — Assessment & Plan Note (Signed)
Discussed use of nasal steroid spray. Plan-nasal nebulizer decongestant, Depo-Medrol. Explained difference between rhinitis and deviated septum

## 2015-02-04 NOTE — Progress Notes (Signed)
Patient ID: [name], male    DOB: Jan 17, 1931, 82 y.o.   MRN: [id number]  HPI Last here Decmber 6, 2011. Wive here now. He is going to [place] for second opinion about ? Parkinsons.  He notices that he coughs more, with some sputum, if he lies on right side. No recent antibiotics.  Increased watery nose with Spring pollen.  CXR- 12/25/09- clear, NAD.  Asks refill generic ambien for occasional nonspecific insomnia.   11/17/10- 78 yoM never smoker followed for recurrent bronchitis, complicated by hx urticaria, PAFIB, DM. Wife here Has had flu vaccine. He had a recent upper respiratory infection with watery rhinorrhea headache and malaise because of which she spent a few days in the house and in bed. He took Benadryl and Tylenol. That illness is largely resolved. He still has nasal congestion and some cough producing clear mucus. He has not been needing his rescue inhaler. He asks for a Depo-Medrol injection and we discussed his blood sugar status. He likes benzonatate for cough.  01/14/11- 78 yoM never smoker followed for recurrent bronchitis, complicated by hx urticaria, PAFIB, DM, Chronic insomnia.   Wife here Acute visit-cough; productive-white and thick, sinus drainage, wheezing as well-worse at night Has had flu vaccine. Went to [facility] emergency room on Christmas Eve with malaise. He was treated with continuous nebulizer, intravenous steroids and prednisone taper. The next day he had some fever which is resolved. He complains of persistent postnasal drip, dry cough and malaise. With hard coughing he has had some sharp left lateral chest wall pain. Sleeping sitting up. His raspy breathing noise worries him. He is taking Delsym, Mucinex D. and says antibiotics have not helped. Still on prednisone. For his problem of chronic insomnia-complains he can't sleep. Waiting for prior authorization to renew his Ambien and can't sleep without it.  03/02/11-  78 yoM never smoker followed for  recurrent bronchitis, complicated by hx urticaria, PAFIB, DM, Chronic insomnia.  C/O cough getting worse-night time; unable to sleep; ENT 3 weeks ago-was told to use salt water mixture in nose. He has been coughing for 6 weeks. His ENT doctor told him to take Prilosec twice daily and that did seem to help initially. In the last 2 weeks cough is worse again, productive of scant white sputum. Taking benzonatate. Much rhinorrhea. Denies any reflux or choking with food but then admits he swallows bread after pills to make them go down.  05/30/12-  80 yoM never smoker followed for recurrent bronchitis, complicated by hx urticaria, PAFIB, DM, Chronic insomnia.  Wife here FOLLOWS FOR: sinus drainage and slight cough at times; Denies any wheezing or  SOB. Scheduled f/u without acute concerns. No urticaria. Some pndrip and throat clearing. Little cough and no wheeze. No concerns about meds or pollen season.  04/24/13- 80 yoM never smoker followed for recurrent bronchitis, complicated by hx urticaria, PAFIB, DM, Chronic insomnia.  Wife here FOLLOWS FOR: acute visit. C/o increase in cough and unable to produce mucus with cough. C/o chest soreness from coughing  and SOB with activity.  2-3 weeks much dry cough and wheeze when lying down. Rescue inhaler not enough help. Postnasal drip. Prednisone +/- help.  12/22/13- 80 yoM never smoker followed for recurrent bronchitis, complicated by hx urticaria, PAFIB, DM, Chronic insomnia.  Wife here FOLLOWS FOR: coughs up thick/clear phelgm after eating; runny nose as well. Pt states his breathing is doing fine.   Dymista sample was not enough help. Nasal congestion and watery nose  are triggered by exposure to cold such as cold drink or food. He asks refill Ambien for his chronic insomnia. He has been tolerating this well without confusion or recognized problems. We discussed safety again.  08/03/14- 80 yoM never smoker followed for recurrent bronchitis, complicated by hx  urticaria, PAFIB, DM, Chronic insomnia.  Wife here Follows for: Still increased cough with grey mucus. Denies any wheezing or SOB.  An acute bronchitis responded to antibiotics but left him with residual postnasal drip and cough productive of small amounts of clear sputum. No fever or wheezing. He has used Mucinex DM. Asks for Depo-Medrol. We discussed steroid effect on his diabetes. He feels this has been the most successful approach for stopping his persistent bronchitis. Also has some persistent nasal stuffiness partly related to known septal deviation. CXR 05/26/14- FINDINGS: Lungs are adequately inflated without focal consolidation or effusion. There is no pneumothorax. Cardiomediastinal silhouette is within normal. There is minimal calcification over the aortic arch. There are degenerative changes of the spine. Chronic stable deformity of the left seventh rib. IMPRESSION: No active cardiopulmonary disease. Electronically Signed  By: Marin Olp M.D.  On: 05/26/2014 21:21  02/04/2015-82 year old male never smoker followed for recurrent bronchitis, complicated by history urticaria, PAFIB, DM, chronic insomnia FOLLOWS FOR: Pt states he is more "short-winded"; stays about the same all the time. Also, having sinus drainage; coughs after eating as well. No energy. Wife states pt vomitted through the night about 5 days ago with stomach issues. Left nostril stays stopped up with little nasal discharge. Easy dyspnea on exertion. Coughs more frequently after eating now. Insomnia-uses Ambien occasionally , requesting refill. We discussed safety age and alternatives.  Review of Systems-see HPI Constitutional:   No-   weight loss, night sweats, fevers, chills, fatigue, lassitude. HEENT:   No-  headaches, difficulty swallowing, tooth/dental problems, sore throat,       No-  sneezing, itching, ear ache, +nasal congestion, +post nasal drip,  CV:  No-   chest pain, orthopnea, PND, swelling in  lower extremities, anasarca, or dizziness, palpitations Resp: +   shortness of breath with exertion or at rest.            + productive cough,  No non-productive cough,  No- coughing up of blood.              No-   change in color of mucus.  No- wheezing.   Skin: No-   rash or lesions. GI:  No-   heartburn, indigestion, abdominal pain, nausea, vomiting, GU:  MS:  No-   joint pain or swelling.    . Neuro-     nothing unusual Psych:  No- change in mood or affect. No depression or anxiety.  No memory loss.  Objective:   Physical Exam General- Alert, Oriented, Affect-appropriate, Distress- none acute Skin- rash-none, lesions- none, excoriation- none Lymphadenopathy- none Head- atraumatic            Eyes- Gross vision intact, PERRLA, conjunctivae clear secretions            Ears- Hearing aid, HOH            Nose- +pale and watery, Septal dev +, mucus, polyps, erosion, perforation             Throat- Mallampati II-III  , mucous+ clear , drainage- none, tonsils- atrophic Neck- flexible , trachea midline, no stridor , thyroid nl, carotid no bruit Chest - symmetrical excursion , unlabored  Heart/CV- RRR , no murmur , no gallop  , no rub, nl s1 s2                           - JVD- none , edema- none, stasis changes- none, varices- none           Lung- clear unlabored, cough+raspy , dullness-none, rub- none           Chest wall- Abd- Br/ Gen/ Rectal- Not done, not indicated Extrem- cyanosis- none, clubbing, none, atrophy- none, strength- nl Neuro- grossly intact to observation, hesitant speech

## 2015-02-04 NOTE — Assessment & Plan Note (Signed)
Agreed to refill Ambien after discussion of safety and alternatives

## 2015-02-05 DIAGNOSIS — M545 Low back pain: Secondary | ICD-10-CM | POA: Diagnosis not present

## 2015-02-05 DIAGNOSIS — M47816 Spondylosis without myelopathy or radiculopathy, lumbar region: Secondary | ICD-10-CM | POA: Diagnosis not present

## 2015-02-12 DIAGNOSIS — H353231 Exudative age-related macular degeneration, bilateral, with active choroidal neovascularization: Secondary | ICD-10-CM | POA: Diagnosis not present

## 2015-02-18 ENCOUNTER — Ambulatory Visit (INDEPENDENT_AMBULATORY_CARE_PROVIDER_SITE_OTHER): Payer: Medicare Other | Admitting: Neurology

## 2015-02-18 ENCOUNTER — Encounter: Payer: Self-pay | Admitting: Neurology

## 2015-02-18 VITALS — BP 176/82 | HR 79 | Ht 70.0 in | Wt 162.0 lb

## 2015-02-18 DIAGNOSIS — G231 Progressive supranuclear ophthalmoplegia [Steele-Richardson-Olszewski]: Secondary | ICD-10-CM

## 2015-02-18 DIAGNOSIS — Z8673 Personal history of transient ischemic attack (TIA), and cerebral infarction without residual deficits: Secondary | ICD-10-CM | POA: Diagnosis not present

## 2015-02-18 MED ORDER — CARBIDOPA-LEVODOPA 25-100 MG PO TABS: 1.0000 | ORAL_TABLET | Freq: Three times a day (TID) | ORAL | Status: DC

## 2015-02-18 NOTE — Progress Notes (Signed)
[name] was seen today in the movement disorders clinic for neurologic consultation at the request of Dr. Ernestina Patches at [facility] for possible Parkinson's disease.  I have reviewed his records and appreciate those.  I have also reviewed the patient's prior neurology records.  The patient sees [name] and last saw him on 12/03/2014 for a TIA that occurred in October, 2016.  The patient reports, however, that his walking troubles predated this event.  In fact, he states that he was told that he had PD about 4 years ago at [facility].  I was able to locate 2012 records from Dr. Leonie Man and he felt that he had atypical parkinsonism and started the patient on levodopa.   The patient reports that he could not tolerate it.    he states that he went to [facility] as a second opinion and was told that he didn't have PD and the way that he "looked in his eyes" was just from an opthalmology problem.  I did find his neurology consult from [facility] from April, 2012 and it appears that while they thought that he may have had some parkinsonism ( the impressions said "PD "  , it also said that it was so mild that they did not recommend treatment).    I also reviewed care everywhere opthalmology notes from [facility].  The patient states that about 3 months ago he started to have trouble dancing.  He is not sure what makes it so that he can not move.  Even exercising has become difficult.    The patient did have an MRI of the brain on 10/14/2014 without gadolinium when he went to the hospital with his TIA.  Had the opportunity to review this.  This was nonacute.  There was a moderate amount of atrophy.  There was evidence of blood breakdown products in the right parietal region fell indicative of old hemorrhage or trauma.  The MRA of the brain was negative for large or medium vessel stenosis.  Specific Symptoms:  Tremor: No. Voice: no change Sleep: sleeping well  Vivid Dreams:  No.  Acting out dreams:   No. Wet Pillows: No. Postural symptoms:  Yes.   (minor)  Falls?  Yes.   (fell on treadmill last week when BS got too low - BS was 60) Bradykinesia symptoms: shuffling gait, slow movements and difficulty getting out of a chair Loss of smell:  No. Loss of taste:  No. Urinary Incontinence:  Yes.   (been an issue for years) Difficulty Swallowing:  Yes.   (pills) Handwriting, micrographia: Yes.   but can't see due to macular degeneration (restricted driving priv) Trouble with ADL's:  No.  Trouble buttoning clothing: No. Depression:  Yes.   Memory changes:  No. Hallucinations:  No.  visual distortions: No. N/V:  No. Lightheaded:  No.  Syncope: No. Diplopia:  No. Dyskinesia:  No.    ALLERGIES:   Allergies  Allergen Reactions  . Colchicine Anaphylaxis  . Atorvastatin Other (See Comments)    Joint soreness  . Ceftin Diarrhea  . Codeine Hives  . Erythromycin Diarrhea  . Ezetimibe Other (See Comments)    Joint soreness  . Hibiclens [Chlorhexidine] Hives    Cannot use must use ivory soap  . Nitroglycerin Other (See Comments)    lowers blood pressure   . Oxycodone-Acetaminophen Hives  . Rosuvastatin Other (See Comments)    Joint soreness  . Simvastatin Other (See Comments)    Joint soreness  .  Vantin Other (See Comments)    Doesn't remember  . Cefpodoxime Rash  . Cefuroxime Axetil Rash  . Contrast Media [Iodinated Diagnostic Agents] Nausea And Vomiting and Rash  . Tetanus Toxoid Rash    CURRENT MEDICATIONS:  Outpatient Encounter Prescriptions as of 02/18/2015  Medication Sig  . acetaminophen (TYLENOL) 500 MG tablet Take 1,000 mg by mouth every 6 (six) hours as needed for mild pain or moderate pain.  Marland Kitchen albuterol (PROAIR HFA) 108 (90 BASE) MCG/ACT inhaler   . aspirin EC 81 MG tablet Take 81 mg by mouth.  . Cholecalciferol (VITAMIN D) 2000 UNITS tablet Take 5,000 Units by mouth daily.   . colesevelam (WELCHOL) 625 MG tablet Take 1,875 mg by mouth 2 (two) times daily with a  meal.   . COMBIGAN 0.2-0.5 % ophthalmic solution Place 1 drop into both eyes every 12 (twelve) hours.   Marland Kitchen diltiazem (CARDIZEM CD) 240 MG 24 hr capsule Take 240 mg by mouth every evening.  . furosemide (LASIX) 20 MG tablet Take 20 mg by mouth every morning.   . insulin regular (NOVOLIN R,HUMULIN R) 100 units/mL injection Inject 5-7 Units into the skin 3 (three) times daily before meals. 5-7 units according to sliding scale  . levothyroxine (SYNTHROID) 200 MCG tablet Take 200 mcg by mouth daily before breakfast. Take with 50 mcg tablet  . levothyroxine (SYNTHROID, LEVOTHROID) 50 MCG tablet Take 50 mcg by mouth daily before breakfast.  . Multiple Vitamins-Minerals (OCUVITE EXTRA PO) Take 1 tablet by mouth daily.   Marland Kitchen omeprazole (PRILOSEC) 20 MG capsule Take 20 mg by mouth daily.  . polyethylene glycol (MIRALAX / GLYCOLAX) packet Take 17 g by mouth daily.  Marland Kitchen zolpidem (AMBIEN) 5 MG tablet Take 1 tablet (5 mg total) by mouth at bedtime as needed for sleep.  . carbidopa-levodopa (SINEMET IR) 25-100 MG tablet Take 1 tablet by mouth 3 (three) times daily.  Marland Kitchen glucose 4 GM chewable tablet Chew 1-2 tablets by mouth as needed for low blood sugar. Reported on 02/18/2015  . [DISCONTINUED] benzonatate (TESSALON) 100 MG capsule Take 1-2 capsules (100-200 mg total) by mouth every 6 (six) hours as needed for cough.  . [DISCONTINUED] DIGITEK 125 MCG tablet Take 1 tablet by mouth daily.   No facility-administered encounter medications on file as of 02/18/2015.    PAST MEDICAL HISTORY:   Past Medical History  Diagnosis Date  . History of skin cancer   . Hypothyroidism   . Asthmatic bronchitis   . Allergic rhinitis   . Urticaria   . Dyslipidemia   . DM type 2 (diabetes mellitus, type 2) ([place])   . Macular degeneration     injections done bilaterally recent - 08-22-13  . Dysrhythmia     '97 tachycardia- no recent issues  . Arthritis     arthritis-kyphosis  . GERD (gastroesophageal reflux disease)   . Prostate  cancer [facility])     Prostate cancer-radiation only,skin cancer-basal cell and last squamous cell right eye  . Urgency-frequency syndrome   . History of kidney stones   . Hearing impaired     bilateral hearing aids  . DVT, lower extremity, proximal, acute ([place])   . Stroke [facility])     PAST SURGICAL HISTORY:   Past Surgical History  Procedure Laterality Date  . Popliteal synovial cyst excision  2005  . Gallbladder surgery  2006    no cholecystectomy  . Total knee arthroplasty  2008    right  . Total knee arthroplasty  2010  left  . Partial thymectomy  1985    for nodules  . Lung removal, partial  age 79    for bronchiectasis  . Cataract surgery Bilateral   . Colonoscopy w/ polypectomy      x2   . Cystoscopy with retrograde pyelogram, ureteroscopy and stent placement    . Back surgery      lumbar fracture  . Eye surgery      macular degeneration  . Hernia repair    . Cystoscopy with injection N/A 09/11/2013    Procedure: CYSTOSCOPY WITH BOTOX INJECTION INTO THE BLADDER;  Surgeon: Ailene Rud, MD;  Location: [facility];  Service: Urology;  Laterality: N/A;    SOCIAL HISTORY:   Social History   Social History  . Marital Status: Married    Spouse Name: N/A  . Number of Children: 2  . Years of Education: N/A   Occupational History  . retired    Social History Main Topics  . Smoking status: Never Smoker   . Smokeless tobacco: Never Used  . Alcohol Use: No  . Drug Use: No  . Sexual Activity: Not Currently   Other Topics Concern  . Not on file   Social History Narrative   MUST HAVE MEDS LISTED DO NOT SUBSTITUTE GENERICS PATIENT IS ALLERGIC TO DYES    FAMILY HISTORY:   Family Status  Relation Status Death Age  . Sister      Ophth disease  . Father Deceased     MI  . Mother Deceased     stroke    ROS:  A complete 10 system review of systems was obtained and was unremarkable apart from what is mentioned above.  PHYSICAL EXAMINATION:    VITALS:   Filed  Vitals:   02/18/15 1409  BP: 176/82  Pulse: 79  Height: 5\' 10"  (1.778 m)  Weight: 162 lb (73.483 kg)    GEN:  The patient appears stated age and is in NAD. HEENT:  Normocephalic, atraumatic.  The mucous membranes are moist. The superficial temporal arteries are without ropiness or tenderness. CV:  RRR Lungs:  CTAB Neck/HEME:  There are no carotid bruits bilaterally.  Neurological examination:  Orientation: The patient is alert and oriented x3. Fund of knowledge is appropriate.  Recent and remote memory are intact.  Attention and concentration are normal.    Able to name objects and repeat phrases. Cranial nerves: There is good facial symmetry.  there is significant facial hypomimia. Fundoscopic exam is attempted but the disc margins are not well visualized bilaterally.  There is upgaze paresis, but otherwise the extraocular muscles are intact.  The visual fields are full to confrontational testing. The speech is fluent and clear.  The speech is hypophonic.  He has some difficulty with the guttural sounds.  Soft palate rises symmetrically and there is no tongue deviation. Hearing is intact to conversational tone. Sensation: Sensation is intact to light and pinprick throughout (facial, trunk, extremities). Vibration is decreased at the bilateral big toe. There is no extinction with double simultaneous stimulation. There is no sensory dermatomal level identified. Motor: Strength is 5/5 in the bilateral upper and lower extremities.   Shoulder shrug is equal and symmetric.  There is no pronator drift. Deep tendon reflexes: Deep tendon reflexes are 2+/4 at the bilateral biceps, triceps, brachioradialis, patella and achilles. Plantar responses is upgoing on the right and downgoing on the left.  Movement examination: Tone: There is mild to moderate increased tone in the right upper  extremity.  Tone elsewhere is normal. Abnormal movements: None Coordination:  There is decremation with RAM's, seen  more on the right than the left and noted more in the lower extremities than the upper extremities Gait and Station: The patient has minimal difficulty arising out of a deep-seated chair without the use of the hands. The patient's stride length is markedly decreased with a stooped posture and decreased arm swing on the right.    ASSESSMENT/PLAN:  1.  Parkinsonism, likely PSP  -I reviewed his neurology notes from Dr. Leonie Man from 2012 and atypical parkinsonism was suspected then, although more recent notes do not make mention of this.  Regardless, I agree with this and also agree with the levodopa trial.  He apparently child at a long time ago and did not tolerate it well.  I told him that perhaps we should try half tablets and work up slowly, but I also told him that patients with atypical parkinsonism generally need a higher dosage if they are going to respond at all.  Therefore, I will plan on seeing him back in one month and reevaluating him.  He did seem a little resistant to the diagnosis, but he was given the opportunity to ask questions and I answered them to the best of my ability.  -He lives at assisted living and I wrote him an order for physical therapy. 2.  History of TIA in October, 2016.  -He will remain on his enteric coated aspirin. 3.  The patient has a follow-up appointment upcoming with Dr. Leonie Man and actually recommended that he keep that and just follow-up with him since he has been following with him for many years.  However, the patient asked if he could continue to come here regarding parkinsonism.  I really have no objection to that, but again encouraged him to follow-up with Dr. Leonie Man, which would mean he would only have one neurologist rather than two. 4.  Much greater than 50% of this visit was spent in counseling with the patient and the family.  Total face to face time:  60 min

## 2015-02-18 NOTE — Patient Instructions (Addendum)
1. Start Carbidopa Levodopa as follows: 1/2 tab three times a day before meals x 1 wk, then 1/2 in am & noon & 1 in evening for a week, then 1/2 in am &1 at noon &one in evening for a week, then 1 tablet three times a day before meals. 2. Physical therapy referral given for facility.  3. Diagnosis is likely PSP.

## 2015-02-19 NOTE — Progress Notes (Signed)
Note routed to Dr Felipa Eth and Dr Ernestina Patches.

## 2015-02-22 DIAGNOSIS — M6281 Muscle weakness (generalized): Secondary | ICD-10-CM | POA: Diagnosis not present

## 2015-02-22 DIAGNOSIS — R2681 Unsteadiness on feet: Secondary | ICD-10-CM | POA: Diagnosis not present

## 2015-02-22 DIAGNOSIS — G231 Progressive supranuclear ophthalmoplegia [Steele-Richardson-Olszewski]: Secondary | ICD-10-CM | POA: Diagnosis not present

## 2015-02-22 DIAGNOSIS — R2689 Other abnormalities of gait and mobility: Secondary | ICD-10-CM | POA: Diagnosis not present

## 2015-02-25 DIAGNOSIS — R2689 Other abnormalities of gait and mobility: Secondary | ICD-10-CM | POA: Diagnosis not present

## 2015-02-25 DIAGNOSIS — R2681 Unsteadiness on feet: Secondary | ICD-10-CM | POA: Diagnosis not present

## 2015-02-25 DIAGNOSIS — G231 Progressive supranuclear ophthalmoplegia [Steele-Richardson-Olszewski]: Secondary | ICD-10-CM | POA: Diagnosis not present

## 2015-02-25 DIAGNOSIS — M6281 Muscle weakness (generalized): Secondary | ICD-10-CM | POA: Diagnosis not present

## 2015-02-26 DIAGNOSIS — R2681 Unsteadiness on feet: Secondary | ICD-10-CM | POA: Diagnosis not present

## 2015-02-26 DIAGNOSIS — R2689 Other abnormalities of gait and mobility: Secondary | ICD-10-CM | POA: Diagnosis not present

## 2015-02-26 DIAGNOSIS — G231 Progressive supranuclear ophthalmoplegia [Steele-Richardson-Olszewski]: Secondary | ICD-10-CM | POA: Diagnosis not present

## 2015-02-26 DIAGNOSIS — M6281 Muscle weakness (generalized): Secondary | ICD-10-CM | POA: Diagnosis not present

## 2015-03-01 DIAGNOSIS — R2689 Other abnormalities of gait and mobility: Secondary | ICD-10-CM | POA: Diagnosis not present

## 2015-03-01 DIAGNOSIS — R2681 Unsteadiness on feet: Secondary | ICD-10-CM | POA: Diagnosis not present

## 2015-03-01 DIAGNOSIS — M6281 Muscle weakness (generalized): Secondary | ICD-10-CM | POA: Diagnosis not present

## 2015-03-01 DIAGNOSIS — G231 Progressive supranuclear ophthalmoplegia [Steele-Richardson-Olszewski]: Secondary | ICD-10-CM | POA: Diagnosis not present

## 2015-03-04 DIAGNOSIS — R2681 Unsteadiness on feet: Secondary | ICD-10-CM | POA: Diagnosis not present

## 2015-03-04 DIAGNOSIS — M6281 Muscle weakness (generalized): Secondary | ICD-10-CM | POA: Diagnosis not present

## 2015-03-04 DIAGNOSIS — G231 Progressive supranuclear ophthalmoplegia [Steele-Richardson-Olszewski]: Secondary | ICD-10-CM | POA: Diagnosis not present

## 2015-03-04 DIAGNOSIS — R2689 Other abnormalities of gait and mobility: Secondary | ICD-10-CM | POA: Diagnosis not present

## 2015-03-05 DIAGNOSIS — G231 Progressive supranuclear ophthalmoplegia [Steele-Richardson-Olszewski]: Secondary | ICD-10-CM | POA: Diagnosis not present

## 2015-03-05 DIAGNOSIS — R2681 Unsteadiness on feet: Secondary | ICD-10-CM | POA: Diagnosis not present

## 2015-03-05 DIAGNOSIS — R2689 Other abnormalities of gait and mobility: Secondary | ICD-10-CM | POA: Diagnosis not present

## 2015-03-05 DIAGNOSIS — M6281 Muscle weakness (generalized): Secondary | ICD-10-CM | POA: Diagnosis not present

## 2015-03-08 DIAGNOSIS — R2689 Other abnormalities of gait and mobility: Secondary | ICD-10-CM | POA: Diagnosis not present

## 2015-03-08 DIAGNOSIS — M6281 Muscle weakness (generalized): Secondary | ICD-10-CM | POA: Diagnosis not present

## 2015-03-08 DIAGNOSIS — G231 Progressive supranuclear ophthalmoplegia [Steele-Richardson-Olszewski]: Secondary | ICD-10-CM | POA: Diagnosis not present

## 2015-03-08 DIAGNOSIS — R2681 Unsteadiness on feet: Secondary | ICD-10-CM | POA: Diagnosis not present

## 2015-03-11 DIAGNOSIS — R2681 Unsteadiness on feet: Secondary | ICD-10-CM | POA: Diagnosis not present

## 2015-03-11 DIAGNOSIS — R2689 Other abnormalities of gait and mobility: Secondary | ICD-10-CM | POA: Diagnosis not present

## 2015-03-11 DIAGNOSIS — G231 Progressive supranuclear ophthalmoplegia [Steele-Richardson-Olszewski]: Secondary | ICD-10-CM | POA: Diagnosis not present

## 2015-03-11 DIAGNOSIS — M6281 Muscle weakness (generalized): Secondary | ICD-10-CM | POA: Diagnosis not present

## 2015-03-12 DIAGNOSIS — G231 Progressive supranuclear ophthalmoplegia [Steele-Richardson-Olszewski]: Secondary | ICD-10-CM | POA: Diagnosis not present

## 2015-03-12 DIAGNOSIS — R2681 Unsteadiness on feet: Secondary | ICD-10-CM | POA: Diagnosis not present

## 2015-03-12 DIAGNOSIS — R2689 Other abnormalities of gait and mobility: Secondary | ICD-10-CM | POA: Diagnosis not present

## 2015-03-12 DIAGNOSIS — M6281 Muscle weakness (generalized): Secondary | ICD-10-CM | POA: Diagnosis not present

## 2015-03-15 DIAGNOSIS — G231 Progressive supranuclear ophthalmoplegia [Steele-Richardson-Olszewski]: Secondary | ICD-10-CM | POA: Diagnosis not present

## 2015-03-15 DIAGNOSIS — R2689 Other abnormalities of gait and mobility: Secondary | ICD-10-CM | POA: Diagnosis not present

## 2015-03-15 DIAGNOSIS — M6281 Muscle weakness (generalized): Secondary | ICD-10-CM | POA: Diagnosis not present

## 2015-03-15 DIAGNOSIS — R2681 Unsteadiness on feet: Secondary | ICD-10-CM | POA: Diagnosis not present

## 2015-03-18 ENCOUNTER — Encounter: Payer: Self-pay | Admitting: Internal Medicine

## 2015-03-18 ENCOUNTER — Ambulatory Visit (INDEPENDENT_AMBULATORY_CARE_PROVIDER_SITE_OTHER): Payer: Medicare Other | Admitting: Internal Medicine

## 2015-03-18 VITALS — BP 130/80 | HR 80 | Temp 98.4°F | Ht 71.0 in | Wt 162.0 lb

## 2015-03-18 DIAGNOSIS — J209 Acute bronchitis, unspecified: Secondary | ICD-10-CM

## 2015-03-18 DIAGNOSIS — J208 Acute bronchitis due to other specified organisms: Secondary | ICD-10-CM

## 2015-03-18 DIAGNOSIS — L509 Urticaria, unspecified: Secondary | ICD-10-CM | POA: Diagnosis not present

## 2015-03-18 MED ORDER — AZITHROMYCIN 250 MG PO TABS: ORAL_TABLET | ORAL | Status: DC

## 2015-03-18 MED ORDER — BENZONATATE 200 MG PO CAPS: 200.0000 mg | ORAL_CAPSULE | Freq: Three times a day (TID) | ORAL | Status: DC | PRN

## 2015-03-18 NOTE — Patient Instructions (Addendum)
Script sent for benzonatate perles  Script sent for Zpak  Please call as needed

## 2015-03-18 NOTE — Progress Notes (Signed)
Patient ID: [name], male    DOB: Jan 17, 1931, 82 y.o.   MRN: [id number]  HPI Last here Decmber 6, 2011. Wive here now. He is going to [place] for second opinion about ? Parkinsons.  He notices that he coughs more, with some sputum, if he lies on right side. No recent antibiotics.  Increased watery nose with Spring pollen.  CXR- 12/25/09- clear, NAD.  Asks refill generic ambien for occasional nonspecific insomnia.   11/17/10- 78 yoM never smoker followed for recurrent bronchitis, complicated by hx urticaria, PAFIB, DM. Wife here Has had flu vaccine. He had a recent upper respiratory infection with watery rhinorrhea headache and malaise because of which she spent a few days in the house and in bed. He took Benadryl and Tylenol. That illness is largely resolved. He still has nasal congestion and some cough producing clear mucus. He has not been needing his rescue inhaler. He asks for a Depo-Medrol injection and we discussed his blood sugar status. He likes benzonatate for cough.  01/14/11- 78 yoM never smoker followed for recurrent bronchitis, complicated by hx urticaria, PAFIB, DM, Chronic insomnia.   Wife here Acute visit-cough; productive-white and thick, sinus drainage, wheezing as well-worse at night Has had flu vaccine. Went to [facility] emergency room on Christmas Eve with malaise. He was treated with continuous nebulizer, intravenous steroids and prednisone taper. The next day he had some fever which is resolved. He complains of persistent postnasal drip, dry cough and malaise. With hard coughing he has had some sharp left lateral chest wall pain. Sleeping sitting up. His raspy breathing noise worries him. He is taking Delsym, Mucinex D. and says antibiotics have not helped. Still on prednisone. For his problem of chronic insomnia-complains he can't sleep. Waiting for prior authorization to renew his Ambien and can't sleep without it.  03/02/11-  78 yoM never smoker followed for  recurrent bronchitis, complicated by hx urticaria, PAFIB, DM, Chronic insomnia.  C/O cough getting worse-night time; unable to sleep; ENT 3 weeks ago-was told to use salt water mixture in nose. He has been coughing for 6 weeks. His ENT doctor told him to take Prilosec twice daily and that did seem to help initially. In the last 2 weeks cough is worse again, productive of scant white sputum. Taking benzonatate. Much rhinorrhea. Denies any reflux or choking with food but then admits he swallows bread after pills to make them go down.  05/30/12-  80 yoM never smoker followed for recurrent bronchitis, complicated by hx urticaria, PAFIB, DM, Chronic insomnia.  Wife here FOLLOWS FOR: sinus drainage and slight cough at times; Denies any wheezing or  SOB. Scheduled f/u without acute concerns. No urticaria. Some pndrip and throat clearing. Little cough and no wheeze. No concerns about meds or pollen season.  04/24/13- 80 yoM never smoker followed for recurrent bronchitis, complicated by hx urticaria, PAFIB, DM, Chronic insomnia.  Wife here FOLLOWS FOR: acute visit. C/o increase in cough and unable to produce mucus with cough. C/o chest soreness from coughing  and SOB with activity.  2-3 weeks much dry cough and wheeze when lying down. Rescue inhaler not enough help. Postnasal drip. Prednisone +/- help.  12/22/13- 80 yoM never smoker followed for recurrent bronchitis, complicated by hx urticaria, PAFIB, DM, Chronic insomnia.  Wife here FOLLOWS FOR: coughs up thick/clear phelgm after eating; runny nose as well. Pt states his breathing is doing fine.   Dymista sample was not enough help. Nasal congestion and watery nose  are triggered by exposure to cold such as cold drink or food. He asks refill Ambien for his chronic insomnia. He has been tolerating this well without confusion or recognized problems. We discussed safety again.  08/03/14- 80 yoM never smoker followed for recurrent bronchitis, complicated by hx  urticaria, PAFIB, DM, Chronic insomnia.  Wife here Follows for: Still increased cough with grey mucus. Denies any wheezing or SOB.  An acute bronchitis responded to antibiotics but left him with residual postnasal drip and cough productive of small amounts of clear sputum. No fever or wheezing. He has used Mucinex DM. Asks for Depo-Medrol. We discussed steroid effect on his diabetes. He feels this has been the most successful approach for stopping his persistent bronchitis. Also has some persistent nasal stuffiness partly related to known septal deviation. CXR 05/26/14- FINDINGS: Lungs are adequately inflated without focal consolidation or effusion. There is no pneumothorax. Cardiomediastinal silhouette is within normal. There is minimal calcification over the aortic arch. There are degenerative changes of the spine. Chronic stable deformity of the left seventh rib. IMPRESSION: No active cardiopulmonary disease. Electronically Signed  By: Marin Olp M.D.  On: 05/26/2014 21:21  02/04/2015-82 year old male never smoker followed for recurrent bronchitis, complicated by history urticaria, PAFIB, DM, chronic insomnia FOLLOWS FOR: Pt states he is more "short-winded"; stays about the same all the time. Also, having sinus drainage; coughs after eating as well. No energy. Wife states pt vomitted through the night about 5 days ago with stomach issues. Left nostril stays stopped up with little nasal discharge. Easy dyspnea on exertion. Coughs more frequently after eating now. Insomnia-uses Ambien occasionally , requesting refill. We discussed safety age and alternatives.  03/18/2015-82 year old male never smoker followed for recurrent bronchitis, complicated by history urticaria, PA fib, DM, chronic insomnia ACUTE VISIT: Pt states he has runny nose, cough, headache, sore throat. Wife states unsure of any fever. Head cold is becoming bronchitis on top of his chronic postnasal drip and cough. 3  days acute illness began with sore throat and head congestion. CXR 10-2014-NAD  Review of Systems-see HPI Constitutional:   No-   weight loss, night sweats, fevers, chills, fatigue, lassitude. HEENT:   No-  headaches, difficulty swallowing, tooth/dental problems, sore throat,       No-  sneezing, itching, ear ache, +nasal congestion, +post nasal drip,  CV:  No-   chest pain, orthopnea, PND, swelling in lower extremities, anasarca, or dizziness, palpitations Resp: +   shortness of breath with exertion or at rest.            + productive cough,  No non-productive cough,  No- coughing up of blood.              No-   change in color of mucus.  No- wheezing.   Skin: No-   rash or lesions. GI:  No-   heartburn, indigestion, abdominal pain, nausea, vomiting, GU:  MS:  No-   joint pain or swelling.    . Neuro-     nothing unusual Psych:  No- change in mood or affect. No depression or anxiety.  No memory loss.  Objective:   Physical Exam General- Alert, Oriented, Affect-appropriate, Distress- none acute, + frail elderly man Skin- rash-none, lesions- none, excoriation- none Lymphadenopathy- none Head- atraumatic            Eyes- Gross vision intact, PERRLA, conjunctivae clear secretions            Ears- Hearing aid, HOH  Nose- +pale and watery, Septal dev +, mucus, polyps, erosion, perforation             Throat- Mallampati II-III  , mucous+ clear , drainage- none, tonsils- atrophic Neck- flexible , trachea midline, no stridor , thyroid nl, carotid no bruit Chest - symmetrical excursion , unlabored           Heart/CV- RRR , no murmur , no gallop  , no rub, nl s1 s2                           - JVD- none , edema- none, stasis changes- none, varices- none           Lung- clear unlabored, cough+raspy/deep , dullness-none, rub- none           Chest wall- Abd- Br/ Gen/ Rectal- Not done, not indicated Extrem- cyanosis- none, clubbing, none, atrophy- none, strength- nl Neuro- grossly  intact to observation, hesitant speech

## 2015-03-19 ENCOUNTER — Encounter: Payer: Self-pay | Admitting: Neurology

## 2015-03-19 ENCOUNTER — Ambulatory Visit (INDEPENDENT_AMBULATORY_CARE_PROVIDER_SITE_OTHER): Payer: Medicare Other | Admitting: Neurology

## 2015-03-19 VITALS — BP 148/82 | HR 82 | Ht 68.0 in | Wt 163.0 lb

## 2015-03-19 DIAGNOSIS — G231 Progressive supranuclear ophthalmoplegia [Steele-Richardson-Olszewski]: Secondary | ICD-10-CM

## 2015-03-19 MED ORDER — CARBIDOPA-LEVODOPA 25-100 MG PO TABS: ORAL_TABLET | ORAL | Status: DC

## 2015-03-19 NOTE — Patient Instructions (Addendum)
1.  Increase your carbidopa/levodopa 25/100 so that you take 1/2 tablet in the AM and then 1 full tablet in the afternoon and evening for a week and the increase your medication to 1 tablet three times a day for 2 weeks and then increase to 2 tablets in the AM and 1 in the afternoon and 1 30 minutes before dinner 2. Call if you have any hallucinations or stomach upset.  3. Follow up 6-8 weeks.

## 2015-03-19 NOTE — Assessment & Plan Note (Signed)
No recurrence after remote treatment with cyclosporine

## 2015-03-19 NOTE — Assessment & Plan Note (Signed)
Impression this may begun as a viral illness that is progressing like some of the severe respiratory infections we have been seeing this winter. Plan-Z-Pak, maintain hydration and avoid chill.

## 2015-03-19 NOTE — Progress Notes (Signed)
[name] was seen today in the movement disorders clinic for neurologic consultation at the request of Dr. Ernestina [name] at [facility] for possible Parkinson's disease.  I have reviewed his records and appreciate those.  I have also reviewed the patient's prior neurology records.  The patient sees Dr [name] and last saw him on 12/03/2014 for a TIA that occurred in October, 2016.  The patient reports, however, that his walking troubles predated this event.  In fact, he states that he was told that he had PD about 4 years ago at [facility].  I was able to locate 2012 records from Dr. Leonie [name] and he felt that he had atypical parkinsonism and started the patient on levodopa.   The patient reports that he could not tolerate it.    he states that he went to [facility] as a second opinion and was told that he didn't have PD and the way that he "looked in his eyes" was just from an opthalmology problem.  I did find his neurology consult from [facility] from April, 2012 and it appears that while they thought that he may have had some parkinsonism ( the impressions said "PD "  , it also said that it was so mild that they did not recommend treatment).    I also reviewed care everywhere opthalmology notes from [facility].  The patient states that about 3 months ago he started to have trouble dancing.  He is not sure what makes it so that he can not move.  Even exercising has become difficult.    The patient did have an MRI of the brain on 10/14/2014 without gadolinium when he went to the hospital with his TIA.  Had the opportunity to review this.  This was nonacute.  There was a moderate amount of atrophy.  There was evidence of blood breakdown products in the right parietal region fell indicative of old hemorrhage or trauma.  The MRA of the brain was negative for large or medium vessel stenosis.  03/19/15 update:  The patient follows up today, accompanied by his wife who supplements the history.  The patient was  started on levodopa slowly last visit, as he has not tolerated this when tried on it years ago .  He was also referred for physical therapy. He isn't sure that it has helped. Overall, he reports that he is doing about the same.  He denies any hallucinations.  He denies lightheadedness or near syncope.  No falls.  He is having no SE with the medication but it has not helped.  He is only on 1/2 po tid.  He does state that he has had bronchitis for the last few days and is on a z-pak and tesselon perles.    ALLERGIES:   Allergies  Allergen Reactions  . Colchicine Anaphylaxis  . Atorvastatin Other (See Comments)    Joint soreness  . Ceftin Diarrhea  . Codeine Hives  . Erythromycin Diarrhea  . Ezetimibe Other (See Comments)    Joint soreness  . Hibiclens [Chlorhexidine] Hives    Cannot use must use ivory soap  . Nitroglycerin Other (See Comments)    lowers blood pressure   . Oxycodone-Acetaminophen Hives  . Rosuvastatin Other (See Comments)    Joint soreness  . Simvastatin Other (See Comments)    Joint soreness  . Vantin Other (See Comments)    Doesn't remember  . Cefpodoxime Rash  . Cefuroxime Axetil Rash  . Contrast Media [Iodinated Diagnostic Agents]  Nausea And Vomiting and Rash  . Tetanus Toxoid Rash    CURRENT MEDICATIONS:  Outpatient Encounter Prescriptions as of 03/19/2015  Medication Sig  . acetaminophen (TYLENOL) 500 MG tablet Take 1,000 mg by mouth every 6 (six) hours as needed for mild pain or moderate pain.  Marland Kitchen albuterol (PROAIR HFA) 108 (90 BASE) MCG/ACT inhaler   . aspirin EC 81 MG tablet Take 81 mg by mouth.  Marland Kitchen azithromycin (ZITHROMAX) 250 MG tablet 2 today then one daily  . benzonatate (TESSALON) 200 MG capsule Take 1 capsule (200 mg total) by mouth 3 (three) times daily as needed for cough.  . carbidopa-levodopa (SINEMET IR) 25-100 MG tablet Take 1 tablet by mouth 3 (three) times daily.  . Cholecalciferol (VITAMIN D) 2000 UNITS tablet Take 5,000 Units by mouth daily.    . colesevelam (WELCHOL) 625 MG tablet Take 1,875 mg by mouth 2 (two) times daily with a meal.   . COMBIGAN 0.2-0.5 % ophthalmic solution Place 1 drop into both eyes every 12 (twelve) hours.   Marland Kitchen diltiazem (CARDIZEM CD) 240 MG 24 hr capsule Take 240 mg by mouth every evening.  . furosemide (LASIX) 20 MG tablet Take 20 mg by mouth every morning.   Marland Kitchen glucose 4 GM chewable tablet Chew 1-2 tablets by mouth as needed for low blood sugar. Reported on 02/18/2015  . insulin regular (NOVOLIN R,HUMULIN R) 100 units/mL injection Inject 5-7 Units into the skin 3 (three) times daily before meals. 5-7 units according to sliding scale  . levothyroxine (SYNTHROID) 200 MCG tablet Take 200 mcg by mouth daily before breakfast. Take with 50 mcg tablet  . levothyroxine (SYNTHROID, LEVOTHROID) 50 MCG tablet Take 50 mcg by mouth daily before breakfast.  . Multiple Vitamins-Minerals (OCUVITE EXTRA PO) Take 1 tablet by mouth daily.   Marland Kitchen omeprazole (PRILOSEC) 20 MG capsule Take 20 mg by mouth daily.  . polyethylene glycol (MIRALAX / GLYCOLAX) packet Take 17 g by mouth daily.  Marland Kitchen zolpidem (AMBIEN) 5 MG tablet Take 1 tablet (5 mg total) by mouth at bedtime as needed for sleep.   No facility-administered encounter medications on file as of 03/19/2015.    PAST MEDICAL HISTORY:   Past Medical History  Diagnosis Date  . History of skin cancer   . Hypothyroidism   . Asthmatic bronchitis   . Allergic rhinitis   . Urticaria   . Dyslipidemia   . DM type 2 (diabetes mellitus, type 2) ([place])   . Macular degeneration     injections done bilaterally recent - 08-22-13  . Dysrhythmia     '97 tachycardia- no recent issues  . Arthritis     arthritis-kyphosis  . GERD (gastroesophageal reflux disease)   . Prostate cancer [facility])     Prostate cancer-radiation only,skin cancer-basal cell and last squamous cell right eye  . Urgency-frequency syndrome   . History of kidney stones   . Hearing impaired     bilateral hearing aids  . DVT,  lower extremity, proximal, acute ([place])   . Stroke [facility])     PAST SURGICAL HISTORY:   Past Surgical History  Procedure Laterality Date  . Popliteal synovial cyst excision  2005  . Gallbladder surgery  2006    no cholecystectomy  . Total knee arthroplasty  2008    right  . Total knee arthroplasty  2010    left  . Partial thymectomy  1985    for nodules  . Lung removal, partial  age 61    for  bronchiectasis  . Cataract surgery Bilateral   . Colonoscopy w/ polypectomy      x2   . Cystoscopy with retrograde pyelogram, ureteroscopy and stent placement    . Back surgery      lumbar fracture  . Eye surgery      macular degeneration  . Hernia repair    . Cystoscopy with injection N/A 09/11/2013    Procedure: CYSTOSCOPY WITH BOTOX INJECTION INTO THE BLADDER;  Surgeon: Ailene Rud, MD;  Location: [facility] ORS;  Service: Urology;  Laterality: N/A;    SOCIAL HISTORY:   Social History   Social History  . Marital Status: Married    Spouse Name: N/A  . Number of Children: 2  . Years of Education: N/A   Occupational History  . retired    Social History Main Topics  . Smoking status: Never Smoker   . Smokeless tobacco: Never Used  . Alcohol Use: No  . Drug Use: No  . Sexual Activity: Not Currently   Other Topics Concern  . Not on file   Social History Narrative   MUST HAVE MEDS LISTED DO NOT SUBSTITUTE GENERICS PATIENT IS ALLERGIC TO DYES    FAMILY HISTORY:   Family Status  Relation Status Death Age  . Sister      Ophth disease  . Father Deceased     MI  . Mother Deceased     stroke    ROS:  A complete 10 system review of systems was obtained and was unremarkable apart from what is mentioned above.  PHYSICAL EXAMINATION:    VITALS:   Filed Vitals:   03/19/15 1308  BP: 148/82  Pulse: 82  Height: 5\' 8"  (1.727 m)  Weight: 163 lb (73.936 kg)    GEN:  The patient appears stated age and is in NAD. HEENT:  Normocephalic, atraumatic.  The mucous membranes are  moist. The superficial temporal arteries are without ropiness or tenderness. CV:  RRR Lungs:  CTAB Neck/HEME:  There are no carotid bruits bilaterally.  Neurological examination:  Orientation: The patient is alert and oriented x3.  Cranial nerves: There is good facial symmetry.  there is significant facial hypomimia. There is upgaze paresis, but otherwise the extraocular muscles are intact.   The speech is fluent and clear.  The speech is hypophonic.  He has some difficulty with the guttural sounds.  Soft palate rises symmetrically and there is no tongue deviation. Hearing is intact to conversational tone. Sensation: Sensation is intact to light touch Motor: Strength is 5/5 in the bilateral upper and lower extremities.   Shoulder shrug is equal and symmetric.  There is no pronator drift.   Movement examination: Tone: There is mild to moderate increased tone in the right upper extremity.  Tone elsewhere is normal. Abnormal movements: None Coordination:  There is decremation with RAM's, seen more on the right than the left and noted more in the lower extremities than the upper extremities Gait and Station: The patient has minimal difficulty arising out of a deep-seated chair without the use of the hands. The patient's stride length is markedly decreased with a stooped posture and decreased arm swing on the right.    ASSESSMENT/PLAN:  1.  Parkinsonism, likely PSP  -I am going to cautiously try and increase his carbidopa/levodopa 25/100 to 0.5/1/1 for a week then 1/1/1 for 2 weeks, then 2/1/1 and then I want him to hold it there  -PT told pt to use walker but he is resistant.  Encouraged him to use one today 2.  History of TIA in October, 2016.  -He will remain on his enteric coated aspirin. 3.  F/u 6 weeks, sooner should new issues arise.  Much greater than 50% of this visit was spent in counseling with the patient and the family.  Total face to face time:  25 min

## 2015-03-22 ENCOUNTER — Telehealth: Payer: Self-pay | Admitting: Internal Medicine

## 2015-03-22 MED ORDER — AMOXICILLIN 500 MG PO TABS: 500.0000 mg | ORAL_TABLET | Freq: Three times a day (TID) | ORAL | Status: DC

## 2015-03-22 NOTE — Telephone Encounter (Signed)
Ok to offer amoxacillin 500 mg, # 21.      1 three times daily

## 2015-03-22 NOTE — Telephone Encounter (Signed)
Called and spoke to pt. Pt states his headache, rhinorrhea, and sore throat have resolved. Pt states his cough is unchanged and is now having yellow mucus. Pt denies SOB, CP/tightness, f/c/s. Pt last saw CY on 3.6.17 and was given zpak and has completed it; pt states he has not noticed much improvement.   Dr. Annamaria Boots please advise. Thanks.   Allergies  Allergen Reactions  . Colchicine Anaphylaxis  . Atorvastatin Other (See Comments)    Joint soreness  . Ceftin Diarrhea  . Codeine Hives  . Erythromycin Diarrhea  . Ezetimibe Other (See Comments)    Joint soreness  . Hibiclens [Chlorhexidine] Hives    Cannot use must use ivory soap  . Nitroglycerin Other (See Comments)    lowers blood pressure   . Oxycodone-Acetaminophen Hives  . Rosuvastatin Other (See Comments)    Joint soreness  . Simvastatin Other (See Comments)    Joint soreness  . Vantin Other (See Comments)    Doesn't remember  . Cefpodoxime Rash  . Cefuroxime Axetil Rash  . Contrast Media [Iodinated Diagnostic Agents] Nausea And Vomiting and Rash  . Tetanus Toxoid Rash    Current Outpatient Prescriptions on File Prior to Visit  Medication Sig Dispense Refill  . acetaminophen (TYLENOL) 500 MG tablet Take 1,000 mg by mouth every 6 (six) hours as needed for mild pain or moderate pain.    Marland Kitchen albuterol (PROAIR HFA) 108 (90 BASE) MCG/ACT inhaler     . aspirin EC 81 MG tablet Take 81 mg by mouth.    Marland Kitchen azithromycin (ZITHROMAX) 250 MG tablet 2 today then one daily 6 tablet 0  . benzonatate (TESSALON) 200 MG capsule Take 1 capsule (200 mg total) by mouth 3 (three) times daily as needed for cough. 30 capsule 1  . carbidopa-levodopa (SINEMET IR) 25-100 MG tablet Take 2 tablets in the morning, 1 in the afternoon, 1 in the evening 120 tablet 1  . Cholecalciferol (VITAMIN D) 2000 UNITS tablet Take 5,000 Units by mouth daily.     . colesevelam (WELCHOL) 625 MG tablet Take 1,875 mg by mouth 2 (two) times daily with a meal.     . COMBIGAN  0.2-0.5 % ophthalmic solution Place 1 drop into both eyes every 12 (twelve) hours.   0  . diltiazem (CARDIZEM CD) 240 MG 24 hr capsule Take 240 mg by mouth every evening.    . furosemide (LASIX) 20 MG tablet Take 20 mg by mouth every morning.     Marland Kitchen glucose 4 GM chewable tablet Chew 1-2 tablets by mouth as needed for low blood sugar. Reported on 02/18/2015    . insulin regular (NOVOLIN R,HUMULIN R) 100 units/mL injection Inject 5-7 Units into the skin 3 (three) times daily before meals. 5-7 units according to sliding scale    . levothyroxine (SYNTHROID) 200 MCG tablet Take 200 mcg by mouth daily before breakfast. Take with 50 mcg tablet    . levothyroxine (SYNTHROID, LEVOTHROID) 50 MCG tablet Take 50 mcg by mouth daily before breakfast.    . Multiple Vitamins-Minerals (OCUVITE EXTRA PO) Take 1 tablet by mouth daily.     Marland Kitchen omeprazole (PRILOSEC) 20 MG capsule Take 20 mg by mouth daily.    . polyethylene glycol (MIRALAX / GLYCOLAX) packet Take 17 g by mouth daily.    Marland Kitchen zolpidem (AMBIEN) 5 MG tablet Take 1 tablet (5 mg total) by mouth at bedtime as needed for sleep. 30 tablet 5   No current facility-administered medications on file prior to  visit.

## 2015-03-22 NOTE — Telephone Encounter (Signed)
Spoke with the pt and notified of recs per CDY  Pt verbalized understanding  Rx was sent to pharm and nothing further needed He will call for ov if not improving or seek emergent care if needed

## 2015-03-25 DIAGNOSIS — R2689 Other abnormalities of gait and mobility: Secondary | ICD-10-CM | POA: Diagnosis not present

## 2015-03-25 DIAGNOSIS — R2681 Unsteadiness on feet: Secondary | ICD-10-CM | POA: Diagnosis not present

## 2015-03-25 DIAGNOSIS — G231 Progressive supranuclear ophthalmoplegia [Steele-Richardson-Olszewski]: Secondary | ICD-10-CM | POA: Diagnosis not present

## 2015-03-25 DIAGNOSIS — M6281 Muscle weakness (generalized): Secondary | ICD-10-CM | POA: Diagnosis not present

## 2015-03-26 DIAGNOSIS — H353211 Exudative age-related macular degeneration, right eye, with active choroidal neovascularization: Secondary | ICD-10-CM | POA: Diagnosis not present

## 2015-03-28 ENCOUNTER — Other Ambulatory Visit: Payer: Self-pay | Admitting: Geriatric Medicine

## 2015-03-28 ENCOUNTER — Ambulatory Visit
Admission: RE | Admit: 2015-03-28 | Discharge: 2015-03-28 | Disposition: A | Discharge status: Home / Self Care | Payer: Medicare Other | Source: Ambulatory Visit | Attending: Geriatric Medicine | Admitting: Geriatric Medicine

## 2015-03-28 DIAGNOSIS — Z1389 Encounter for screening for other disorder: Secondary | ICD-10-CM | POA: Diagnosis not present

## 2015-03-28 DIAGNOSIS — G231 Progressive supranuclear ophthalmoplegia [Steele-Richardson-Olszewski]: Secondary | ICD-10-CM | POA: Diagnosis not present

## 2015-03-28 DIAGNOSIS — R05 Cough: Secondary | ICD-10-CM | POA: Diagnosis not present

## 2015-03-28 DIAGNOSIS — Z794 Long term (current) use of insulin: Secondary | ICD-10-CM | POA: Diagnosis not present

## 2015-03-28 DIAGNOSIS — M6281 Muscle weakness (generalized): Secondary | ICD-10-CM | POA: Diagnosis not present

## 2015-03-28 DIAGNOSIS — R0602 Shortness of breath: Secondary | ICD-10-CM | POA: Diagnosis not present

## 2015-03-28 DIAGNOSIS — R059 Cough, unspecified: Secondary | ICD-10-CM

## 2015-03-28 DIAGNOSIS — N183 Chronic kidney disease, stage 3 (moderate): Secondary | ICD-10-CM | POA: Diagnosis not present

## 2015-03-28 DIAGNOSIS — Z0000 Encounter for general adult medical examination without abnormal findings: Secondary | ICD-10-CM | POA: Diagnosis not present

## 2015-03-28 DIAGNOSIS — K219 Gastro-esophageal reflux disease without esophagitis: Secondary | ICD-10-CM | POA: Diagnosis not present

## 2015-03-28 DIAGNOSIS — Z79899 Other long term (current) drug therapy: Secondary | ICD-10-CM | POA: Diagnosis not present

## 2015-03-28 DIAGNOSIS — E7800 Pure hypercholesterolemia, unspecified: Secondary | ICD-10-CM | POA: Diagnosis not present

## 2015-03-28 DIAGNOSIS — E039 Hypothyroidism, unspecified: Secondary | ICD-10-CM | POA: Diagnosis not present

## 2015-03-28 DIAGNOSIS — R2681 Unsteadiness on feet: Secondary | ICD-10-CM | POA: Diagnosis not present

## 2015-03-28 DIAGNOSIS — Z7984 Long term (current) use of oral hypoglycemic drugs: Secondary | ICD-10-CM | POA: Diagnosis not present

## 2015-03-28 DIAGNOSIS — I129 Hypertensive chronic kidney disease with stage 1 through stage 4 chronic kidney disease, or unspecified chronic kidney disease: Secondary | ICD-10-CM | POA: Diagnosis not present

## 2015-03-28 DIAGNOSIS — R2689 Other abnormalities of gait and mobility: Secondary | ICD-10-CM | POA: Diagnosis not present

## 2015-03-28 DIAGNOSIS — E1122 Type 2 diabetes mellitus with diabetic chronic kidney disease: Secondary | ICD-10-CM | POA: Diagnosis not present

## 2015-03-29 DIAGNOSIS — J42 Unspecified chronic bronchitis: Secondary | ICD-10-CM | POA: Diagnosis not present

## 2015-03-29 DIAGNOSIS — E119 Type 2 diabetes mellitus without complications: Secondary | ICD-10-CM | POA: Diagnosis not present

## 2015-03-29 DIAGNOSIS — I10 Essential (primary) hypertension: Secondary | ICD-10-CM | POA: Diagnosis not present

## 2015-03-29 DIAGNOSIS — G20 Parkinson's disease: Secondary | ICD-10-CM | POA: Diagnosis not present

## 2015-04-05 DIAGNOSIS — R2689 Other abnormalities of gait and mobility: Secondary | ICD-10-CM | POA: Diagnosis not present

## 2015-04-05 DIAGNOSIS — M6281 Muscle weakness (generalized): Secondary | ICD-10-CM | POA: Diagnosis not present

## 2015-04-08 ENCOUNTER — Telehealth: Payer: Self-pay | Admitting: Neurology

## 2015-04-08 DIAGNOSIS — M6281 Muscle weakness (generalized): Secondary | ICD-10-CM | POA: Diagnosis not present

## 2015-04-08 DIAGNOSIS — R2689 Other abnormalities of gait and mobility: Secondary | ICD-10-CM | POA: Diagnosis not present

## 2015-04-08 NOTE — Telephone Encounter (Signed)
PT's wife [name] called in regards to his medication/Dawn CB# [phone]

## 2015-04-08 NOTE — Telephone Encounter (Signed)
Spoke with [name], patient's wife, she states patient is weak and not walking well and his speech is bad today. He is blaming increase of Carbidopa Levodopa and wants to stop medication. Aware this does not sound like a reaction to Carbidopa Levodopa. He has also had bronchitis and has been on three different antibiotics over the last few weeks. Symptoms started days ago, but they are at their worst today. Please advise.

## 2015-04-09 DIAGNOSIS — M6281 Muscle weakness (generalized): Secondary | ICD-10-CM | POA: Diagnosis not present

## 2015-04-09 DIAGNOSIS — R2689 Other abnormalities of gait and mobility: Secondary | ICD-10-CM | POA: Diagnosis not present

## 2015-04-09 NOTE — Telephone Encounter (Signed)
Left message on machine for patient to call back.

## 2015-04-09 NOTE — Telephone Encounter (Signed)
Deneise Lever from [facility] called in regards to PT and would like a call back at [employer] and spoke with [place]. She states last BP was 147/81 on 03/31/2015. They are rechecking vital signs now BP 132/78 now. Charge nurse states he is up walking today and looks better. He is not having any numbness. He is having no weakness today, but yesterday this was all over his body. His speech is at his baseline. They will keep patient on his medication and if he develops these symptoms again he will see his medical doctor/ER. Will call with any further problems.

## 2015-04-09 NOTE — Telephone Encounter (Signed)
Have her take his BP and let us know what it is.  Make sure not lateralizing weakness or paresthesias.  Can hold med for a day or two and see if diff but may be the acute illness.  If worse, go to ER

## 2015-04-15 ENCOUNTER — Telehealth: Payer: Self-pay | Admitting: Internal Medicine

## 2015-04-15 DIAGNOSIS — J209 Acute bronchitis, unspecified: Secondary | ICD-10-CM | POA: Diagnosis not present

## 2015-04-15 DIAGNOSIS — R2689 Other abnormalities of gait and mobility: Secondary | ICD-10-CM | POA: Diagnosis not present

## 2015-04-15 NOTE — Telephone Encounter (Signed)
Spoke with pt's wife, ok with waiting until tomorrow to hear recs from [place].  CY please advise.  Thanks!!  Last ov:03/18/15 Next ov: 08/05/15  Allergies  Allergen Reactions  . Colchicine Anaphylaxis  . Atorvastatin Other (See Comments)    Joint soreness  . Ceftin Diarrhea  . Codeine Hives  . Erythromycin Diarrhea  . Ezetimibe Other (See Comments)    Joint soreness  . Hibiclens [Chlorhexidine] Hives    Cannot use must use ivory soap  . Nitroglycerin Other (See Comments)    lowers blood pressure   . Oxycodone-Acetaminophen Hives  . Rosuvastatin Other (See Comments)    Joint soreness  . Simvastatin Other (See Comments)    Joint soreness  . Vantin Other (See Comments)    Doesn't remember  . Cefpodoxime Rash  . Cefuroxime Axetil Rash  . Contrast Media [Iodinated Diagnostic Agents] Nausea And Vomiting and Rash  . Tetanus Toxoid Rash   Current Outpatient Prescriptions on File Prior to Visit  Medication Sig Dispense Refill  . acetaminophen (TYLENOL) 500 MG tablet Take 1,000 mg by mouth every 6 (six) hours as needed for mild pain or moderate pain.    Marland Kitchen albuterol (PROAIR HFA) 108 (90 BASE) MCG/ACT inhaler     . amoxicillin (AMOXIL) 500 MG tablet Take 1 tablet (500 mg total) by mouth 3 (three) times daily. 21 tablet 0  . aspirin EC 81 MG tablet Take 81 mg by mouth.    Marland Kitchen azithromycin (ZITHROMAX) 250 MG tablet 2 today then one daily 6 tablet 0  . benzonatate (TESSALON) 200 MG capsule Take 1 capsule (200 mg total) by mouth 3 (three) times daily as needed for cough. 30 capsule 1  . carbidopa-levodopa (SINEMET IR) 25-100 MG tablet Take 2 tablets in the morning, 1 in the afternoon, 1 in the evening 120 tablet 1  . Cholecalciferol (VITAMIN D) 2000 UNITS tablet Take 5,000 Units by mouth daily.     . colesevelam (WELCHOL) 625 MG tablet Take 1,875 mg by mouth 2 (two) times daily with a meal.     . COMBIGAN 0.2-0.5 % ophthalmic solution Place 1 drop into both eyes every 12 (twelve) hours.   0  .  diltiazem (CARDIZEM CD) 240 MG 24 hr capsule Take 240 mg by mouth every evening.    . furosemide (LASIX) 20 MG tablet Take 20 mg by mouth every morning.     Marland Kitchen glucose 4 GM chewable tablet Chew 1-2 tablets by mouth as needed for low blood sugar. Reported on 02/18/2015    . insulin regular (NOVOLIN R,HUMULIN R) 100 units/mL injection Inject 5-7 Units into the skin 3 (three) times daily before meals. 5-7 units according to sliding scale    . levothyroxine (SYNTHROID) 200 MCG tablet Take 200 mcg by mouth daily before breakfast. Take with 50 mcg tablet    . levothyroxine (SYNTHROID, LEVOTHROID) 50 MCG tablet Take 50 mcg by mouth daily before breakfast.    . Multiple Vitamins-Minerals (OCUVITE EXTRA PO) Take 1 tablet by mouth daily.     Marland Kitchen omeprazole (PRILOSEC) 20 MG capsule Take 20 mg by mouth daily.    . polyethylene glycol (MIRALAX / GLYCOLAX) packet Take 17 g by mouth daily.    Marland Kitchen zolpidem (AMBIEN) 5 MG tablet Take 1 tablet (5 mg total) by mouth at bedtime as needed for sleep. 30 tablet 5   No current facility-administered medications on file prior to visit.

## 2015-04-15 NOTE — Telephone Encounter (Signed)
Spoke with pt, c/o mostly nonprod cough with occasional yellow/brown mucus X3 weeks.  Denies sinus congestion, fever, chest pain.  Pt has been taking tessalon perles- has had zpak and amoxicillin called in for these same s/s in the past month.   Pt uses [facility] on [address].    MW please advise on recs.  Thanks!

## 2015-04-15 NOTE — Telephone Encounter (Signed)
Just called in both w/in last month so defer to Dr Annamaria Boots tomorrow when to see him or NP maybe can

## 2015-04-16 DIAGNOSIS — J209 Acute bronchitis, unspecified: Secondary | ICD-10-CM | POA: Diagnosis not present

## 2015-04-16 DIAGNOSIS — R2689 Other abnormalities of gait and mobility: Secondary | ICD-10-CM | POA: Diagnosis not present

## 2015-04-16 MED ORDER — DOXYCYCLINE HYCLATE 100 MG PO TABS: ORAL_TABLET | ORAL | Status: DC

## 2015-04-16 NOTE — Telephone Encounter (Signed)
Spoke with pt and pt's wife, aware of recs.  rx called in to pharmacy.  Nothing further needed.

## 2015-04-16 NOTE — Telephone Encounter (Signed)
Offer doxycycline 100 mg, # 12     2 today then one daily

## 2015-04-16 NOTE — Telephone Encounter (Signed)
Pt wife calling again about this please advise.[name]

## 2015-04-17 ENCOUNTER — Ambulatory Visit: Payer: Medicare Other | Admitting: Podiatry

## 2015-04-18 DIAGNOSIS — R2689 Other abnormalities of gait and mobility: Secondary | ICD-10-CM | POA: Diagnosis not present

## 2015-04-18 DIAGNOSIS — J209 Acute bronchitis, unspecified: Secondary | ICD-10-CM | POA: Diagnosis not present

## 2015-04-21 ENCOUNTER — Emergency Department (HOSPITAL_COMMUNITY): Payer: Medicare Other

## 2015-04-21 ENCOUNTER — Encounter (HOSPITAL_COMMUNITY): Payer: Self-pay

## 2015-04-21 ENCOUNTER — Emergency Department (HOSPITAL_COMMUNITY)
Admission: EM | Admit: 2015-04-21 | Discharge: 2015-04-21 | Disposition: A | Discharge status: Home / Self Care | Payer: Medicare Other | Attending: Emergency Medicine | Admitting: Emergency Medicine

## 2015-04-21 DIAGNOSIS — Z79899 Other long term (current) drug therapy: Secondary | ICD-10-CM | POA: Insufficient documentation

## 2015-04-21 DIAGNOSIS — E119 Type 2 diabetes mellitus without complications: Secondary | ICD-10-CM | POA: Diagnosis not present

## 2015-04-21 DIAGNOSIS — Z86718 Personal history of other venous thrombosis and embolism: Secondary | ICD-10-CM | POA: Diagnosis not present

## 2015-04-21 DIAGNOSIS — Z87442 Personal history of urinary calculi: Secondary | ICD-10-CM | POA: Insufficient documentation

## 2015-04-21 DIAGNOSIS — Z8546 Personal history of malignant neoplasm of prostate: Secondary | ICD-10-CM | POA: Insufficient documentation

## 2015-04-21 DIAGNOSIS — R0982 Postnasal drip: Secondary | ICD-10-CM | POA: Insufficient documentation

## 2015-04-21 DIAGNOSIS — J40 Bronchitis, not specified as acute or chronic: Secondary | ICD-10-CM | POA: Diagnosis not present

## 2015-04-21 DIAGNOSIS — E039 Hypothyroidism, unspecified: Secondary | ICD-10-CM | POA: Diagnosis not present

## 2015-04-21 DIAGNOSIS — K219 Gastro-esophageal reflux disease without esophagitis: Secondary | ICD-10-CM | POA: Insufficient documentation

## 2015-04-21 DIAGNOSIS — Z794 Long term (current) use of insulin: Secondary | ICD-10-CM | POA: Diagnosis not present

## 2015-04-21 DIAGNOSIS — Z7982 Long term (current) use of aspirin: Secondary | ICD-10-CM | POA: Diagnosis not present

## 2015-04-21 DIAGNOSIS — E785 Hyperlipidemia, unspecified: Secondary | ICD-10-CM | POA: Insufficient documentation

## 2015-04-21 DIAGNOSIS — R05 Cough: Secondary | ICD-10-CM | POA: Diagnosis not present

## 2015-04-21 DIAGNOSIS — Z8673 Personal history of transient ischemic attack (TIA), and cerebral infarction without residual deficits: Secondary | ICD-10-CM | POA: Diagnosis not present

## 2015-04-21 DIAGNOSIS — Z85828 Personal history of other malignant neoplasm of skin: Secondary | ICD-10-CM | POA: Insufficient documentation

## 2015-04-21 LAB — CBC WITH DIFFERENTIAL/PLATELET
BASOS ABS: 0.1 10*3/uL (ref 0.0–0.1)
BASOS PCT: 1 %
EOS PCT: 2 %
Eosinophils Absolute: 0.2 10*3/uL (ref 0.0–0.7)
HCT: 39.7 % (ref 39.0–52.0)
Hemoglobin: 13.1 g/dL (ref 13.0–17.0)
Lymphocytes Relative: 13 %
Lymphs Abs: 1.5 10*3/uL (ref 0.7–4.0)
MCH: 30.6 pg (ref 26.0–34.0)
MCHC: 33 g/dL (ref 30.0–36.0)
MCV: 92.8 fL (ref 78.0–100.0)
MONO ABS: 1.3 10*3/uL — AB (ref 0.1–1.0)
Monocytes Relative: 11 %
Neutro Abs: 8.6 10*3/uL — ABNORMAL HIGH (ref 1.7–7.7)
Neutrophils Relative %: 73 %
PLATELETS: 223 10*3/uL (ref 150–400)
RBC: 4.28 MIL/uL (ref 4.22–5.81)
RDW: 13.1 % (ref 11.5–15.5)
WBC: 11.6 10*3/uL — ABNORMAL HIGH (ref 4.0–10.5)

## 2015-04-21 LAB — COMPREHENSIVE METABOLIC PANEL
ALBUMIN: 3.8 g/dL (ref 3.5–5.0)
ALT: 5 U/L — ABNORMAL LOW (ref 17–63)
AST: 16 U/L (ref 15–41)
Alkaline Phosphatase: 56 U/L (ref 38–126)
Anion gap: 10 (ref 5–15)
BUN: 22 mg/dL — AB (ref 6–20)
CHLORIDE: 102 mmol/L (ref 101–111)
CO2: 26 mmol/L (ref 22–32)
Calcium: 9.4 mg/dL (ref 8.9–10.3)
Creatinine, Ser: 1.38 mg/dL — ABNORMAL HIGH (ref 0.61–1.24)
GFR calc Af Amer: 54 mL/min — ABNORMAL LOW (ref >60)
GFR, EST NON AFRICAN AMERICAN: 46 mL/min — AB (ref >60)
GLUCOSE: 256 mg/dL — AB (ref 65–99)
POTASSIUM: 3.8 mmol/L (ref 3.5–5.1)
Sodium: 138 mmol/L (ref 135–145)
Total Bilirubin: 0.8 mg/dL (ref 0.3–1.2)
Total Protein: 6.4 g/dL — ABNORMAL LOW (ref 6.5–8.1)

## 2015-04-21 MED ORDER — OXYMETAZOLINE HCL 0.05 % NA SOLN: 1.0000 | Freq: Two times a day (BID) | NASAL | Status: DC

## 2015-04-21 MED ORDER — ALBUTEROL SULFATE HFA 108 (90 BASE) MCG/ACT IN AERS: 1.0000 | INHALATION_SPRAY | Freq: Four times a day (QID) | RESPIRATORY_TRACT | Status: DC | PRN

## 2015-04-21 MED ORDER — OXYMETAZOLINE HCL 0.05 % NA SOLN
1.0000 | Freq: Once | NASAL | Status: AC
  Administered 2015-04-21: 1 via NASAL
  Filled 2015-04-21: qty 15

## 2015-04-21 MED ORDER — FLUTICASONE PROPIONATE 50 MCG/ACT NA SUSP: 2.0000 | Freq: Every day | NASAL | Status: DC

## 2015-04-21 MED ORDER — IPRATROPIUM-ALBUTEROL 0.5-2.5 (3) MG/3ML IN SOLN
3.0000 mL | Freq: Once | RESPIRATORY_TRACT | Status: AC
  Administered 2015-04-21: 3 mL via RESPIRATORY_TRACT
  Filled 2015-04-21: qty 3

## 2015-04-21 NOTE — ED Provider Notes (Signed)
CSN: [id number]     Arrival date & time 04/21/15  0957 History   First MD Initiated Contact with Patient 04/21/15 0957     Chief Complaint  Patient presents with  . Cough     (Consider location/radiation/quality/duration/timing/severity/associated sxs/prior Treatment) The history is provided by the patient.  [name] is a 82 y.o. male hx of DM, hypothyroidism, HL, stroke, DVT off anticoagulation here with coughing. Has been coughing for the last 4 months. Has finished multiple rounds of antibiotics, including doxycycline, Amoxicillin, Z-Pak. About 5 days ago his cough got worse so was prescribed another course of doxycycline. Denies any fevers or chills. He was coughing a lot last several days and was unable to sleep last night due to the coughing. States that he has some nasal drainage that goes in the back of his throat and he coughs it up for the last 2-3 days. Denies any heart disease and denies any shortness of breath or chest pain. He did take his Tessalon Perles but did not help with coughing.          Past Medical History  Diagnosis Date  . History of skin cancer   . Hypothyroidism   . Asthmatic bronchitis   . Allergic rhinitis   . Urticaria   . Dyslipidemia   . DM type 2 (diabetes mellitus, type 2) ([place])   . Macular degeneration     injections done bilaterally recent - 08-22-13  . Dysrhythmia     '97 tachycardia- no recent issues  . Arthritis     arthritis-kyphosis  . GERD (gastroesophageal reflux disease)   . Prostate cancer [facility])     Prostate cancer-radiation only,skin cancer-basal cell and last squamous cell right eye  . Urgency-frequency syndrome   . History of kidney stones   . Hearing impaired     bilateral hearing aids  . DVT, lower extremity, proximal, acute ([place])   . Stroke [facility])    Past Surgical History  Procedure Laterality Date  . Popliteal synovial cyst excision  2005  . Gallbladder surgery  2006    no cholecystectomy  . Total knee arthroplasty   2008    right  . Total knee arthroplasty  2010    left  . Partial thymectomy  1985    for nodules  . Lung removal, partial  age 30    for bronchiectasis  . Cataract surgery Bilateral   . Colonoscopy w/ polypectomy      x2   . Cystoscopy with retrograde pyelogram, ureteroscopy and stent placement    . Back surgery      lumbar fracture  . Eye surgery      macular degeneration  . Hernia repair    . Cystoscopy with injection N/A 09/11/2013    Procedure: CYSTOSCOPY WITH BOTOX INJECTION INTO THE BLADDER;  Surgeon: Ailene Rud, MD;  Location: [facility]S;  Service: Urology;  Laterality: N/A;   Family History  Problem Relation Age of Onset  . Other Sister     ophth disease  . Heart attack Father     x7  . Heart disease Father     MI x7  . Hypertension Mother   . Stroke Mother 26   Social History  Substance Use Topics  . Smoking status: Never Smoker   . Smokeless tobacco: Never Used  . Alcohol Use: No    Review of Systems  Respiratory: Positive for cough.   All other systems reviewed and are negative.  Allergies  Colchicine; Atorvastatin; Ceftin; Codeine; Erythromycin; Ezetimibe; Hibiclens; Nitroglycerin; Oxycodone-acetaminophen; Rosuvastatin; Simvastatin; Vantin; Cefpodoxime; Cefuroxime axetil; Contrast media; and Tetanus toxoid  Home Medications   Prior to Admission medications   Medication Sig Start Date End Date Taking? Authorizing Provider  acetaminophen (TYLENOL) 500 MG tablet Take 1,000 mg by mouth every 6 (six) hours as needed for mild pain or moderate pain.    Historical Provider, MD  albuterol (PROAIR HFA) 108 (90 BASE) MCG/ACT inhaler  04/24/13   Historical Provider, MD  amoxicillin (AMOXIL) 500 MG tablet Take 1 tablet (500 mg total) by mouth 3 (three) times daily. 03/22/15   Deneise Lever, MD  aspirin EC 81 MG tablet Take 81 mg by mouth.    Historical Provider, MD  azithromycin (ZITHROMAX) 250 MG tablet 2 today then one daily 03/18/15   Deneise Lever,  MD  benzonatate (TESSALON) 200 MG capsule Take 1 capsule (200 mg total) by mouth 3 (three) times daily as needed for cough. 03/18/15   Deneise Lever, MD  carbidopa-levodopa (SINEMET IR) 25-100 MG tablet Take 2 tablets in the morning, 1 in the afternoon, 1 in the evening 03/19/15   Wells Guiles S Tat, DO  Cholecalciferol (VITAMIN D) 2000 UNITS tablet Take 5,000 Units by mouth daily.     Historical Provider, MD  colesevelam (WELCHOL) 625 MG tablet Take 1,875 mg by mouth 2 (two) times daily with a meal.     Historical Provider, MD  COMBIGAN 0.2-0.5 % ophthalmic solution Place 1 drop into both eyes every 12 (twelve) hours.  05/25/14   Historical Provider, MD  diltiazem (CARDIZEM CD) 240 MG 24 hr capsule Take 240 mg by mouth every evening.    Historical Provider, MD  doxycycline (VIBRA-TABS) 100 MG tablet Take 2 tabs today, then 1 daily until gone. 04/16/15   Deneise Lever, MD  furosemide (LASIX) 20 MG tablet Take 20 mg by mouth every morning.     Historical Provider, MD  glucose 4 GM chewable tablet Chew 1-2 tablets by mouth as needed for low blood sugar. Reported on 02/18/2015    Historical Provider, MD  insulin regular (NOVOLIN R,HUMULIN R) 100 units/mL injection Inject 5-7 Units into the skin 3 (three) times daily before meals. 5-7 units according to sliding scale    Historical Provider, MD  levothyroxine (SYNTHROID) 200 MCG tablet Take 200 mcg by mouth daily before breakfast. Take with 50 mcg tablet    Historical Provider, MD  levothyroxine (SYNTHROID, LEVOTHROID) 50 MCG tablet Take 50 mcg by mouth daily before breakfast.    Historical Provider, MD  Multiple Vitamins-Minerals (OCUVITE EXTRA PO) Take 1 tablet by mouth daily.     Historical Provider, MD  omeprazole (PRILOSEC) 20 MG capsule Take 20 mg by mouth daily.    Historical Provider, MD  polyethylene glycol (MIRALAX / GLYCOLAX) packet Take 17 g by mouth daily.    Historical Provider, MD  zolpidem (AMBIEN) 5 MG tablet Take 1 tablet (5 mg total) by mouth at  bedtime as needed for sleep. 02/04/15   Deneise Lever, MD   BP 134/86 mmHg  Pulse 70  Temp(Src) 98.4 F (36.9 C) (Oral)  Resp 18  SpO2 100% Physical Exam  Constitutional: He is oriented to person, place, and time.  Chronically ill, NAD   HENT:  Head: Normocephalic.  Mouth/Throat: Oropharynx is clear and moist.  No sinus tenderness or obvious congestion   Eyes: Conjunctivae are normal. Pupils are equal, round, and reactive to light.  Neck: Normal  range of motion. Neck supple.  Cardiovascular: Normal rate, regular rhythm and normal heart sounds.   Pulmonary/Chest:  Diminished throughout, no obvious wheezing or crackles   Abdominal: Soft. Bowel sounds are normal. He exhibits no distension. There is no tenderness. There is no rebound.  Musculoskeletal: Normal range of motion. He exhibits no edema or tenderness.  Neurological: He is alert and oriented to person, place, and time. No cranial nerve deficit. Coordination normal.  Skin: Skin is warm and dry.  Psychiatric: He has a normal mood and affect. His behavior is normal. Judgment and thought content normal.  Nursing note and vitals reviewed.   ED Course  Procedures (including critical care time) Labs Review Labs Reviewed  CBC WITH DIFFERENTIAL/PLATELET - Abnormal; Notable for the following:    WBC 11.6 (*)    Neutro Abs 8.6 (*)    Monocytes Absolute 1.3 (*)    All other components within normal limits  COMPREHENSIVE METABOLIC PANEL - Abnormal; Notable for the following:    Glucose, Bld 256 (*)    BUN 22 (*)    Creatinine, Ser 1.38 (*)    Total Protein 6.4 (*)    ALT 5 (*)    GFR calc non Af Amer 46 (*)    GFR calc Af Amer 54 (*)    All other components within normal limits    Imaging Review Dg Chest 2 View  04/21/2015  CLINICAL DATA:  Cough, sinus trouble x 4 days, pt states he is unable to sleep due to the coughing. Hx diabetes, left lower lobectomy. EXAM: CHEST  2 VIEW COMPARISON:  03/28/2015 FINDINGS: Cardiac  silhouette is normal in size and configuration. No mediastinal or hilar masses or adenopathy. Probable small hiatal hernia. There are stable reticular opacities in the peripheral left lung base consistent scarring. Lungs otherwise clear. No pleural effusion or pneumothorax. Chronic left seventh rib deformity consistent with previous surgery, stable. IMPRESSION: No acute cardiopulmonary disease. Stable appearance from the prior study. Electronically Signed   By: Lajean Manes M.D.   On: 04/21/2015 10:58   I have personally reviewed and evaluated these images and lab results as part of my medical decision-making.   EKG Interpretation None      MDM   Final diagnoses:  None   [name] is a 82 y.o. male here with cough. He is on doxycycline already. Afebrile, not tachycardic or hypoxic. Well appearing. Consider URI vs post nasal drip vs bronchitis. Will get CXR, labs. Will give afrin and neb and reassess.   11:07 AM Given afrin and albuterol, minimal wheezing on the lung base, improved air movement. WBC 11. CXR showed no pneumonia. Afebrile, stable vitals. I think bronchitis vs post nasal drip. Will dc home with afrin for 3 days, flonase, albuterol as needed. Will continue doxycycline and tessalon pearls as prescribed by PCP.    Wandra Arthurs, MD 04/21/15 [phone]

## 2015-04-21 NOTE — Discharge Instructions (Signed)
Stay hydrated.   Continue your doxycyline and cough medicine as prescribed by your doctor.   Add afrin to both nose twice daily for 3 days and then stop after that.   Try flonase daily.   Use albuterol every 6 hrs as needed for coughing or wheezing.   See your doctor next week   Return to ER if you have worse trouble breathing, coughing, fever, dehydration, vomiting.

## 2015-04-21 NOTE — ED Notes (Signed)
Pt returns from xray

## 2015-04-21 NOTE — ED Notes (Signed)
Pt presents with complaint of cough. Per pt. He has chronic bronchitis, followed by Dr Felipa Eth. Pt. Reports on multiple abx and given tessalon pearls. Stated that he has post nasal drip and presents today due to coughing all night.

## 2015-04-22 DIAGNOSIS — R2689 Other abnormalities of gait and mobility: Secondary | ICD-10-CM | POA: Diagnosis not present

## 2015-04-22 DIAGNOSIS — J209 Acute bronchitis, unspecified: Secondary | ICD-10-CM | POA: Diagnosis not present

## 2015-04-23 DIAGNOSIS — R05 Cough: Secondary | ICD-10-CM | POA: Diagnosis not present

## 2015-04-25 ENCOUNTER — Ambulatory Visit (INDEPENDENT_AMBULATORY_CARE_PROVIDER_SITE_OTHER): Payer: Medicare Other | Admitting: Podiatry

## 2015-04-25 ENCOUNTER — Encounter: Payer: Self-pay | Admitting: Podiatry

## 2015-04-25 VITALS — BP 131/76 | HR 76 | Resp 12

## 2015-04-25 DIAGNOSIS — L600 Ingrowing nail: Secondary | ICD-10-CM | POA: Diagnosis not present

## 2015-04-25 DIAGNOSIS — L03031 Cellulitis of right toe: Secondary | ICD-10-CM | POA: Diagnosis not present

## 2015-04-25 DIAGNOSIS — IMO0002 Reserved for concepts with insufficient information to code with codable children: Secondary | ICD-10-CM

## 2015-04-25 NOTE — Patient Instructions (Signed)

## 2015-04-25 NOTE — Progress Notes (Signed)
   Subjective:    Patient ID: [name], male    DOB: 05/14/1931, 82 y.o.   MRN: [id number]  HPI  PT STATED B/L GREAT TOENAILS BEEN SORE FOR 3 WEEKS. TOENAILS GET WORSE WHEN PUTTING PRESSURE ON IT-TRIED NO TREATMENT.  Review of Systems  Constitutional: Positive for activity change and fatigue.  HENT: Positive for sinus pressure.   Eyes: Positive for visual disturbance.  Respiratory: Positive for cough and shortness of breath.   Musculoskeletal: Positive for gait problem.  Skin: Positive for color change.       Objective:   Physical Exam        Assessment & Plan:

## 2015-04-26 DIAGNOSIS — R2689 Other abnormalities of gait and mobility: Secondary | ICD-10-CM | POA: Diagnosis not present

## 2015-04-26 DIAGNOSIS — J209 Acute bronchitis, unspecified: Secondary | ICD-10-CM | POA: Diagnosis not present

## 2015-04-28 NOTE — Progress Notes (Signed)
Subjective:     Patient ID: [name], male   DOB: Nov 29, 1931, 82 y.o.   MRN: [id number]  HPI patient presents stating both of my big toenails are draining and irritated in the corners and I cannot cut them myself. Patient states this happens periodically and we've not been able to do permanent procedures   Review of Systems     Objective:   Physical Exam Neurovascular status intact muscle strength adequate with inflammation of the medial and lateral corners of each hallux with incurvation present and pain    Assessment:     Paronychia infection of the hallux nails bilateral long-term in nature with moderate diminishment of neuro vascular status    Plan:     H&P condition reviewed. Today I infiltrated each hallux 60 mg Xylocaine Marcaine mixture applied sterile prep and remove the medial and lateral corners carefully reducing all proud flesh tissue. I removed abscess tissue from the distal tips advised on soaks applied sterile dressings and reappoint to recheck

## 2015-04-29 DIAGNOSIS — J209 Acute bronchitis, unspecified: Secondary | ICD-10-CM | POA: Diagnosis not present

## 2015-04-29 DIAGNOSIS — R2689 Other abnormalities of gait and mobility: Secondary | ICD-10-CM | POA: Diagnosis not present

## 2015-04-30 DIAGNOSIS — R2689 Other abnormalities of gait and mobility: Secondary | ICD-10-CM | POA: Diagnosis not present

## 2015-04-30 DIAGNOSIS — J209 Acute bronchitis, unspecified: Secondary | ICD-10-CM | POA: Diagnosis not present

## 2015-05-01 DIAGNOSIS — J209 Acute bronchitis, unspecified: Secondary | ICD-10-CM | POA: Diagnosis not present

## 2015-05-01 DIAGNOSIS — R2689 Other abnormalities of gait and mobility: Secondary | ICD-10-CM | POA: Diagnosis not present

## 2015-05-03 ENCOUNTER — Telehealth: Payer: Self-pay | Admitting: *Deleted

## 2015-05-03 NOTE — Telephone Encounter (Signed)
Called patient at [phone] (Home #) to check to see how they were doing from their ingrown toenail procedure that was performed on Thursday, April 25, 2015. Pt is hard of hearing and I was not able to communicate with him on the phone. I elevated my voice multiple times, but patient could not hear me.

## 2015-05-07 ENCOUNTER — Encounter: Payer: Self-pay | Admitting: Neurology

## 2015-05-07 ENCOUNTER — Ambulatory Visit (INDEPENDENT_AMBULATORY_CARE_PROVIDER_SITE_OTHER): Payer: Medicare Other | Admitting: Neurology

## 2015-05-07 VITALS — BP 158/78 | HR 74 | Ht 68.0 in | Wt 157.0 lb

## 2015-05-07 DIAGNOSIS — G231 Progressive supranuclear ophthalmoplegia [Steele-Richardson-Olszewski]: Secondary | ICD-10-CM

## 2015-05-07 NOTE — Patient Instructions (Signed)
1. Stop Carbidopa Levodopa.

## 2015-05-07 NOTE — Progress Notes (Signed)
[name] was seen today in the movement disorders clinic for neurologic consultation at the request of Dr. Ernestina Patches at [facility] orthopedics for possible Parkinson's disease.  I have reviewed his records and appreciate those.  I have also reviewed the patient's prior neurology records.  The patient sees [name] and last saw him on 12/03/2014 for a TIA that occurred in October, 2016.  The patient reports, however, that his walking troubles predated this event.  In fact, he states that he was told that he had PD about 4 years ago at [facility].  I was able to locate 2012 records from Dr. Leonie Man and he felt that he had atypical parkinsonism and started the patient on levodopa.   The patient reports that he could not tolerate it.    he states that he went to [facility] as a second opinion and was told that he didn't have PD and the way that he "looked in his eyes" was just from an opthalmology problem.  I did find his neurology consult from [facility] from April, 2012 and it appears that while they thought that he may have had some parkinsonism ( the impressions said "PD "  , it also said that it was so mild that they did not recommend treatment).    I also reviewed care everywhere opthalmology notes from [facility].  The patient states that about 3 months ago he started to have trouble dancing.  He is not sure what makes it so that he can not move.  Even exercising has become difficult.    The patient did have an MRI of the brain on 10/14/2014 without gadolinium when he went to the hospital with his TIA.  Had the opportunity to review this.  This was nonacute.  There was a moderate amount of atrophy.  There was evidence of blood breakdown products in the right parietal region fell indicative of old hemorrhage or trauma.  The MRA of the brain was negative for large or medium vessel stenosis.  03/19/15 update:  The patient follows up today, accompanied by his wife who supplements the history.  The patient was  started on levodopa slowly last visit, as he has not tolerated this when tried on it years ago .  He was also referred for physical therapy. He isn't sure that it has helped. Overall, he reports that he is doing about the same.  He denies any hallucinations.  He denies lightheadedness or near syncope.  No falls.  He is having no SE with the medication but it has not helped.  He is only on 1/2 po tid.  He does state that he has had bronchitis for the last few days and is on a z-pak and tesselon perles.  05/07/15 update:  The patient follows up today, accompanied by his wife who supplements the history.  Last visit, I slowly increased his current/levodopa 25/100 from 0.5 tablets 3 times a day to 2 tablets in the morning, one in the afternoon and one in the evening.  His wife called me on March 27 and states that that the patient was blaming weakness and overall not feeling well on the levodopa, but the patient also had bronchitis and had been on multiple antibiotics at the time.  I told them that they could stop levodopa and see if that helped for a few days.  He ended up in the emergency room on 04/21/2015 with complaints of continued cough.  The workup was negative.  ALLERGIES:   Allergies  Allergen Reactions  . Colchicine Anaphylaxis  . Atorvastatin Other (See Comments)    Joint soreness  . Ceftin Diarrhea  . Codeine Hives  . Erythromycin Diarrhea  . Ezetimibe Other (See Comments)    Joint soreness  . Hibiclens [Chlorhexidine] Hives    Cannot use must use ivory soap  . Nitroglycerin Other (See Comments)    lowers blood pressure   . Oxycodone-Acetaminophen Hives  . Rosuvastatin Other (See Comments)    Joint soreness  . Simvastatin Other (See Comments)    Joint soreness  . Vantin Other (See Comments)    Doesn't remember  . Cefpodoxime Rash  . Cefuroxime Axetil Rash  . Contrast Media [Iodinated Diagnostic Agents] Nausea And Vomiting and Rash  . Tetanus Toxoid Rash    CURRENT  MEDICATIONS:  Outpatient Encounter Prescriptions as of 05/07/2015  Medication Sig  . acetaminophen (TYLENOL) 500 MG tablet Take 1,000 mg by mouth every 6 (six) hours as needed for mild pain or moderate pain.  Marland Kitchen albuterol (PROVENTIL HFA;VENTOLIN HFA) 108 (90 Base) MCG/ACT inhaler Inhale 1-2 puffs into the lungs every 6 (six) hours as needed for wheezing or shortness of breath.  Marland Kitchen aspirin EC 81 MG tablet Take 81 mg by mouth.  . benzonatate (TESSALON) 200 MG capsule Take 1 capsule (200 mg total) by mouth 3 (three) times daily as needed for cough.  . carbidopa-levodopa (SINEMET IR) 25-100 MG tablet Take 2 tablets in the morning, 1 in the afternoon, 1 in the evening  . Cholecalciferol (VITAMIN D) 2000 UNITS tablet Take 5,000 Units by mouth daily.   . colesevelam (WELCHOL) 625 MG tablet Take 1,875 mg by mouth 2 (two) times daily with a meal.   . COMBIGAN 0.2-0.5 % ophthalmic solution Place 1 drop into both eyes every 12 (twelve) hours.   Marland Kitchen diltiazem (CARDIZEM CD) 240 MG 24 hr capsule Take 240 mg by mouth every evening.  . fluticasone (FLONASE) 50 MCG/ACT nasal spray Place 2 sprays into both nostrils daily.  . furosemide (LASIX) 20 MG tablet Take 20 mg by mouth every morning.   Marland Kitchen glucose 4 GM chewable tablet Chew 1-2 tablets by mouth as needed for low blood sugar. Reported on 02/18/2015  . insulin regular (NOVOLIN R,HUMULIN R) 100 units/mL injection Inject 5-7 Units into the skin 3 (three) times daily before meals. 5-7 units according to sliding scale  . Multiple Vitamins-Minerals (OCUVITE EXTRA PO) Take 1 tablet by mouth daily.   Marland Kitchen omeprazole (PRILOSEC) 20 MG capsule Take 20 mg by mouth daily.  Marland Kitchen oxymetazoline (AFRIN NASAL SPRAY) 0.05 % nasal spray Place 1 spray into both nostrils 2 (two) times daily.  . polyethylene glycol (MIRALAX / GLYCOLAX) packet Take 17 g by mouth daily.  Marland Kitchen zolpidem (AMBIEN) 5 MG tablet Take 1 tablet (5 mg total) by mouth at bedtime as needed for sleep.  . [DISCONTINUED]  amoxicillin (AMOXIL) 500 MG tablet Take 1 tablet (500 mg total) by mouth 3 (three) times daily.  . [DISCONTINUED] azithromycin (ZITHROMAX) 250 MG tablet 2 today then one daily  . [DISCONTINUED] doxycycline (VIBRA-TABS) 100 MG tablet Take 2 tabs today, then 1 daily until gone.  . [DISCONTINUED] levothyroxine (SYNTHROID) 200 MCG tablet Take 200 mcg by mouth daily before breakfast. Take with 50 mcg tablet  . [DISCONTINUED] levothyroxine (SYNTHROID, LEVOTHROID) 50 MCG tablet Take 50 mcg by mouth daily before breakfast.   No facility-administered encounter medications on file as of 05/07/2015.    PAST MEDICAL HISTORY:  Past Medical History  Diagnosis Date  . History of skin cancer   . Hypothyroidism   . Asthmatic bronchitis   . Allergic rhinitis   . Urticaria   . Dyslipidemia   . DM type 2 (diabetes mellitus, type 2) ([place])   . Macular degeneration     injections done bilaterally recent - 08-22-13  . Dysrhythmia     '97 tachycardia- no recent issues  . Arthritis     arthritis-kyphosis  . GERD (gastroesophageal reflux disease)   . Prostate cancer [facility])     Prostate cancer-radiation only,skin cancer-basal cell and last squamous cell right eye  . Urgency-frequency syndrome   . History of kidney stones   . Hearing impaired     bilateral hearing aids  . DVT, lower extremity, proximal, acute ([place])   . Stroke [facility])     PAST SURGICAL HISTORY:   Past Surgical History  Procedure Laterality Date  . Popliteal synovial cyst excision  2005  . Gallbladder surgery  2006    no cholecystectomy  . Total knee arthroplasty  2008    right  . Total knee arthroplasty  2010    left  . Partial thymectomy  1985    for nodules  . Lung removal, partial  age 41    for bronchiectasis  . Cataract surgery Bilateral   . Colonoscopy w/ polypectomy      x2   . Cystoscopy with retrograde pyelogram, ureteroscopy and stent placement    . Back surgery      lumbar fracture  . Eye surgery      macular  degeneration  . Hernia repair    . Cystoscopy with injection N/A 09/11/2013    Procedure: CYSTOSCOPY WITH BOTOX INJECTION INTO THE BLADDER;  Surgeon: Ailene Rud, MD;  Location: [facility];  Service: Urology;  Laterality: N/A;    SOCIAL HISTORY:   Social History   Social History  . Marital Status: Married    Spouse Name: N/A  . Number of Children: 2  . Years of Education: N/A   Occupational History  . retired    Social History Main Topics  . Smoking status: Never Smoker   . Smokeless tobacco: Never Used  . Alcohol Use: No  . Drug Use: No  . Sexual Activity: Not Currently   Other Topics Concern  . Not on file   Social History Narrative   MUST HAVE MEDS LISTED DO NOT SUBSTITUTE GENERICS PATIENT IS ALLERGIC TO DYES    FAMILY HISTORY:   Family Status  Relation Status Death Age  . Sister      Ophth disease  . Father Deceased     MI  . Mother Deceased     stroke    ROS:  A complete 10 system review of systems was obtained and was unremarkable apart from what is mentioned above.  PHYSICAL EXAMINATION:    VITALS:   Filed Vitals:   05/07/15 1330  BP: 158/78  Pulse: 74  Height: 5\' 8"  (1.727 m)  Weight: 157 lb (71.215 kg)    GEN:  The patient appears stated age and is in NAD. HEENT:  Normocephalic, atraumatic.  The mucous membranes are moist. The superficial temporal arteries are without ropiness or tenderness. CV:  RRR Lungs:  CTAB Neck/HEME:  There are no carotid bruits bilaterally.  Neurological examination:  Orientation: The patient is alert and oriented x3.  Cranial nerves: There is good facial symmetry.  there is significant facial hypomimia. There is upgaze  paresis, but otherwise the extraocular muscles are intact.   The speech is fluent and clear.  The speech is hypophonic.  He has some difficulty with the guttural sounds.  Soft palate rises symmetrically and there is no tongue deviation. Hearing is intact to conversational tone. Sensation: Sensation  is intact to light touch Motor: Strength is 5/5 in the bilateral upper and lower extremities.   Shoulder shrug is equal and symmetric.  There is no pronator drift.   Movement examination: Tone: There is mild increased tone in the right upper extremity (improved).  Tone elsewhere is normal. Abnormal movements: None Coordination:  There is decremation with RAM's, seen mostly with heel taps/toe taps on the right Gait and Station: The patient has minimal difficulty arising out of a deep-seated chair without the use of the hands. The patient's stride length is markedly decreased with a stooped posture and decreased arm swing on the right.    ASSESSMENT/PLAN:  1.  Parkinsonism, likely PSP  -I think that the levodopa helped his rigidity but not the walking but may have not reached high enough dose (2/1/1) but pt wants to d/c med as he doesn't think it helps.  Warned him that if arm is more rigid may not be able to get it out fast enough for protection if falls.  He and wife understood.  Offered levodopa challenge test but refused.   2.  History of TIA in October, 2016.  -He will remain on his enteric coated aspirin. 3.  F/u 6 months sooner should new issues arise.

## 2015-05-13 DIAGNOSIS — M4806 Spinal stenosis, lumbar region: Secondary | ICD-10-CM | POA: Diagnosis not present

## 2015-05-13 DIAGNOSIS — M4316 Spondylolisthesis, lumbar region: Secondary | ICD-10-CM | POA: Diagnosis not present

## 2015-05-13 DIAGNOSIS — M4120 Other idiopathic scoliosis, site unspecified: Secondary | ICD-10-CM | POA: Diagnosis not present

## 2015-05-13 DIAGNOSIS — M5416 Radiculopathy, lumbar region: Secondary | ICD-10-CM | POA: Diagnosis not present

## 2015-05-13 DIAGNOSIS — G8929 Other chronic pain: Secondary | ICD-10-CM | POA: Diagnosis not present

## 2015-05-13 DIAGNOSIS — I10 Essential (primary) hypertension: Secondary | ICD-10-CM | POA: Diagnosis not present

## 2015-05-13 DIAGNOSIS — M545 Low back pain: Secondary | ICD-10-CM | POA: Diagnosis not present

## 2015-05-13 DIAGNOSIS — G231 Progressive supranuclear ophthalmoplegia [Steele-Richardson-Olszewski]: Secondary | ICD-10-CM | POA: Diagnosis not present

## 2015-05-20 DIAGNOSIS — M4316 Spondylolisthesis, lumbar region: Secondary | ICD-10-CM | POA: Diagnosis not present

## 2015-05-21 DIAGNOSIS — H353211 Exudative age-related macular degeneration, right eye, with active choroidal neovascularization: Secondary | ICD-10-CM | POA: Diagnosis not present

## 2015-05-24 DIAGNOSIS — M47816 Spondylosis without myelopathy or radiculopathy, lumbar region: Secondary | ICD-10-CM | POA: Diagnosis not present

## 2015-05-24 DIAGNOSIS — M4316 Spondylolisthesis, lumbar region: Secondary | ICD-10-CM | POA: Diagnosis not present

## 2015-06-03 ENCOUNTER — Ambulatory Visit: Payer: Medicare Other | Admitting: Neurology

## 2015-06-07 DIAGNOSIS — M5416 Radiculopathy, lumbar region: Secondary | ICD-10-CM | POA: Diagnosis not present

## 2015-06-07 DIAGNOSIS — M545 Low back pain: Secondary | ICD-10-CM | POA: Diagnosis not present

## 2015-06-07 DIAGNOSIS — I10 Essential (primary) hypertension: Secondary | ICD-10-CM | POA: Diagnosis not present

## 2015-06-07 DIAGNOSIS — M4316 Spondylolisthesis, lumbar region: Secondary | ICD-10-CM | POA: Diagnosis not present

## 2015-06-07 DIAGNOSIS — G231 Progressive supranuclear ophthalmoplegia [Steele-Richardson-Olszewski]: Secondary | ICD-10-CM | POA: Diagnosis not present

## 2015-06-07 DIAGNOSIS — M7138 Other bursal cyst, other site: Secondary | ICD-10-CM | POA: Diagnosis not present

## 2015-06-07 DIAGNOSIS — M4806 Spinal stenosis, lumbar region: Secondary | ICD-10-CM | POA: Diagnosis not present

## 2015-06-17 DIAGNOSIS — M545 Low back pain: Secondary | ICD-10-CM | POA: Diagnosis not present

## 2015-06-17 DIAGNOSIS — M7138 Other bursal cyst, other site: Secondary | ICD-10-CM | POA: Diagnosis not present

## 2015-06-21 ENCOUNTER — Encounter (HOSPITAL_COMMUNITY): Payer: Self-pay | Admitting: Emergency Medicine

## 2015-06-21 ENCOUNTER — Emergency Department (HOSPITAL_COMMUNITY)
Admission: EM | Admit: 2015-06-21 | Discharge: 2015-06-21 | Disposition: A | Discharge status: Home / Self Care | Payer: Medicare Other | Attending: Emergency Medicine | Admitting: Emergency Medicine

## 2015-06-21 DIAGNOSIS — D692 Other nonthrombocytopenic purpura: Secondary | ICD-10-CM | POA: Diagnosis not present

## 2015-06-21 DIAGNOSIS — E11649 Type 2 diabetes mellitus with hypoglycemia without coma: Secondary | ICD-10-CM | POA: Insufficient documentation

## 2015-06-21 DIAGNOSIS — Z7982 Long term (current) use of aspirin: Secondary | ICD-10-CM | POA: Diagnosis not present

## 2015-06-21 DIAGNOSIS — R4182 Altered mental status, unspecified: Secondary | ICD-10-CM | POA: Diagnosis not present

## 2015-06-21 DIAGNOSIS — Z794 Long term (current) use of insulin: Secondary | ICD-10-CM | POA: Diagnosis not present

## 2015-06-21 DIAGNOSIS — Z79899 Other long term (current) drug therapy: Secondary | ICD-10-CM | POA: Diagnosis not present

## 2015-06-21 DIAGNOSIS — E162 Hypoglycemia, unspecified: Secondary | ICD-10-CM | POA: Diagnosis not present

## 2015-06-21 DIAGNOSIS — Z8546 Personal history of malignant neoplasm of prostate: Secondary | ICD-10-CM | POA: Insufficient documentation

## 2015-06-21 DIAGNOSIS — R269 Unspecified abnormalities of gait and mobility: Secondary | ICD-10-CM | POA: Diagnosis not present

## 2015-06-21 DIAGNOSIS — Z85828 Personal history of other malignant neoplasm of skin: Secondary | ICD-10-CM | POA: Insufficient documentation

## 2015-06-21 DIAGNOSIS — N183 Chronic kidney disease, stage 3 (moderate): Secondary | ICD-10-CM | POA: Diagnosis not present

## 2015-06-21 DIAGNOSIS — Z8673 Personal history of transient ischemic attack (TIA), and cerebral infarction without residual deficits: Secondary | ICD-10-CM | POA: Diagnosis not present

## 2015-06-21 DIAGNOSIS — E161 Other hypoglycemia: Secondary | ICD-10-CM | POA: Diagnosis not present

## 2015-06-21 DIAGNOSIS — Z96653 Presence of artificial knee joint, bilateral: Secondary | ICD-10-CM | POA: Diagnosis not present

## 2015-06-21 DIAGNOSIS — N189 Chronic kidney disease, unspecified: Secondary | ICD-10-CM | POA: Diagnosis not present

## 2015-06-21 DIAGNOSIS — L03116 Cellulitis of left lower limb: Secondary | ICD-10-CM | POA: Diagnosis not present

## 2015-06-21 DIAGNOSIS — I129 Hypertensive chronic kidney disease with stage 1 through stage 4 chronic kidney disease, or unspecified chronic kidney disease: Secondary | ICD-10-CM | POA: Insufficient documentation

## 2015-06-21 DIAGNOSIS — E1122 Type 2 diabetes mellitus with diabetic chronic kidney disease: Secondary | ICD-10-CM | POA: Diagnosis not present

## 2015-06-21 HISTORY — DX: Essential (primary) hypertension: I10

## 2015-06-21 LAB — I-STAT CHEM 8, ED
BUN: 29 mg/dL — AB (ref 6–20)
CALCIUM ION: 1.16 mmol/L (ref 1.13–1.30)
CHLORIDE: 100 mmol/L — AB (ref 101–111)
CREATININE: 1.2 mg/dL (ref 0.61–1.24)
Glucose, Bld: 147 mg/dL — ABNORMAL HIGH (ref 65–99)
HCT: 42 % (ref 39.0–52.0)
Hemoglobin: 14.3 g/dL (ref 13.0–17.0)
Potassium: 3.1 mmol/L — ABNORMAL LOW (ref 3.5–5.1)
SODIUM: 142 mmol/L (ref 135–145)
TCO2: 30 mmol/L (ref 0–100)

## 2015-06-21 LAB — URINE MICROSCOPIC-ADD ON: SQUAMOUS EPITHELIAL / LPF: NONE SEEN

## 2015-06-21 LAB — URINALYSIS, ROUTINE W REFLEX MICROSCOPIC
BILIRUBIN URINE: NEGATIVE
GLUCOSE, UA: NEGATIVE mg/dL
Ketones, ur: NEGATIVE mg/dL
Leukocytes, UA: NEGATIVE
NITRITE: NEGATIVE
PH: 6 (ref 5.0–8.0)
Protein, ur: 30 mg/dL — AB
SPECIFIC GRAVITY, URINE: 1.016 (ref 1.005–1.030)

## 2015-06-21 LAB — CBG MONITORING, ED
GLUCOSE-CAPILLARY: 155 mg/dL — AB (ref 65–99)
Glucose-Capillary: 123 mg/dL — ABNORMAL HIGH (ref 65–99)

## 2015-06-21 MED ORDER — POTASSIUM CHLORIDE CRYS ER 20 MEQ PO TBCR
40.0000 meq | EXTENDED_RELEASE_TABLET | Freq: Once | ORAL | Status: AC
  Administered 2015-06-21: 40 meq via ORAL
  Filled 2015-06-21: qty 2

## 2015-06-21 NOTE — ED Notes (Signed)
Pt from [facility] assisted living. Wife and staff member found pt minimally responsive this afternoon. Fire department found that pt had a CBG of46. Gave pt 34 grams oral glucose. CBG was 78 upon EMS arrival and 124 prior to arrival in ED. CBG 123 in triage. Facility also wants pt evaluated for a UTI. Pt was at PCP's office this am for a leg wound and was given an abx for keflex. Pt missed lunch today, is on insulin.

## 2015-06-21 NOTE — Discharge Instructions (Signed)
Make sure you eat at least 3 meals a day.   Follow up with your md next week.

## 2015-06-21 NOTE — ED Notes (Signed)
Bed: [id number] Expected date:  Expected time:  Means of arrival:  Comments: EMS 82 yo

## 2015-06-21 NOTE — ED Provider Notes (Signed)
CSN: [id number]     Arrival date & time 06/21/15  1633 History   First MD Initiated Contact with Patient 06/21/15 1644     Chief Complaint  Patient presents with  . Hypoglycemia     (Consider location/radiation/quality/duration/timing/severity/associated sxs/prior Treatment) Patient is a 82 y.o. male presenting with hypoglycemia. The history is provided by the EMS personnel (The patient was confused today from my staff she had blood sugar. He did not have his lunch today).  Hypoglycemia Severity:  Severe Onset quality:  Sudden Timing:  Intermittent Progression:  Improving Chronicity:  Recurrent Diabetic status:  Controlled with insulin   Past Medical History  Diagnosis Date  . History of skin cancer   . Hypothyroidism   . Asthmatic bronchitis   . Allergic rhinitis   . Urticaria   . Dyslipidemia   . DM type 2 (diabetes mellitus, type 2) ([place])   . Macular degeneration     injections done bilaterally recent - 08-22-13  . Dysrhythmia     '97 tachycardia- no recent issues  . Arthritis     arthritis-kyphosis  . GERD (gastroesophageal reflux disease)   . Prostate cancer [facility])     Prostate cancer-radiation only,skin cancer-basal cell and last squamous cell right eye  . Urgency-frequency syndrome   . History of kidney stones   . Hearing impaired     bilateral hearing aids  . DVT, lower extremity, proximal, acute ([place])   . Stroke ([place])   . Hypertension   . Chronic kidney disease    Past Surgical History  Procedure Laterality Date  . Popliteal synovial cyst excision  2005  . Gallbladder surgery  2006    no cholecystectomy  . Total knee arthroplasty  2008    right  . Total knee arthroplasty  2010    left  . Partial thymectomy  1985    for nodules  . Lung removal, partial  age 65    for bronchiectasis  . Cataract surgery Bilateral   . Colonoscopy w/ polypectomy      x2   . Cystoscopy with retrograde pyelogram, ureteroscopy and stent placement    . Back surgery     lumbar fracture  . Eye surgery      macular degeneration  . Hernia repair    . Cystoscopy with injection N/A 09/11/2013    Procedure: CYSTOSCOPY WITH BOTOX INJECTION INTO THE BLADDER;  Surgeon: Ailene Rud, MD;  Location: [facility];  Service: Urology;  Laterality: N/A;   Family History  Problem Relation Age of Onset  . Other Sister     ophth disease  . Heart attack Father     x7  . Heart disease Father     MI x7  . Hypertension Mother   . Stroke Mother 11   Social History  Substance Use Topics  . Smoking status: Never Smoker   . Smokeless tobacco: Never Used  . Alcohol Use: No    Review of Systems  Unable to perform ROS: Mental status change      Allergies  Colchicine; Atorvastatin; Ceftin; Celebrex; Codeine; Erythromycin; Ezetimibe; Hibiclens; Nitroglycerin; Oxycodone-acetaminophen; Rosuvastatin; Simvastatin; Vantin; Cefpodoxime; Cefuroxime axetil; Contrast media; and Tetanus toxoid  Home Medications   Prior to Admission medications   Medication Sig Start Date End Date Taking? Authorizing Provider  acetaminophen (TYLENOL) 500 MG tablet Take 1,000 mg by mouth every 6 (six) hours as needed for mild pain or moderate pain.   Yes Historical Provider, MD  aspirin EC 81 MG tablet  Take 81 mg by mouth.   Yes Historical Provider, MD  calcium carbonate (TUMS - DOSED IN MG ELEMENTAL CALCIUM) 500 MG chewable tablet Chew 2 tablets by mouth daily.   Yes Historical Provider, MD  Cholecalciferol (VITAMIN D) 2000 UNITS tablet Take 5,000 Units by mouth daily. Reported on 06/21/2015   Yes Historical Provider, MD  colesevelam (WELCHOL) 625 MG tablet Take 1,875 mg by mouth 2 (two) times daily with a meal.    Yes Historical Provider, MD  COMBIGAN 0.2-0.5 % ophthalmic solution Place 1 drop into both eyes every 12 (twelve) hours.  05/25/14  Yes Historical Provider, MD  digoxin (LANOXIN) 0.125 MG tablet Take 0.125 mg by mouth daily.   Yes Historical Provider, MD  diltiazem (CARDIZEM CD) 240 MG  24 hr capsule Take 240 mg by mouth every evening.   Yes Historical Provider, MD  docusate sodium (COLACE) 100 MG capsule Take 100 mg by mouth daily as needed for mild constipation.   Yes Historical Provider, MD  fluticasone (FLONASE) 50 MCG/ACT nasal spray Place 2 sprays into both nostrils daily. 04/21/15  Yes Wandra Arthurs, MD  furosemide (LASIX) 20 MG tablet Take 20 mg by mouth every morning.    Yes Historical Provider, MD  levothyroxine (SYNTHROID, LEVOTHROID) 200 MCG tablet Take 200 mcg by mouth daily before breakfast.   Yes Historical Provider, MD  levothyroxine (SYNTHROID, LEVOTHROID) 75 MCG tablet Take 75 mcg by mouth daily before breakfast.   Yes Historical Provider, MD  omeprazole (PRILOSEC) 20 MG capsule Take 20 mg by mouth daily.   Yes Historical Provider, MD  oxymetazoline (AFRIN NASAL SPRAY) 0.05 % nasal spray Place 1 spray into both nostrils 2 (two) times daily. 04/21/15  Yes Wandra Arthurs, MD  zolpidem (AMBIEN) 5 MG tablet Take 1 tablet (5 mg total) by mouth at bedtime as needed for sleep. 02/04/15  Yes Deneise Lever, MD  albuterol (PROVENTIL HFA;VENTOLIN HFA) 108 (90 Base) MCG/ACT inhaler Inhale 1-2 puffs into the lungs every 6 (six) hours as needed for wheezing or shortness of breath. 04/21/15   Wandra Arthurs, MD  benzonatate (TESSALON) 200 MG capsule Take 1 capsule (200 mg total) by mouth 3 (three) times daily as needed for cough. Patient not taking: Reported on 06/21/2015 03/18/15   Deneise Lever, MD  carbidopa-levodopa (SINEMET IR) 25-100 MG tablet Take 2 tablets in the morning, 1 in the afternoon, 1 in the evening Patient not taking: Reported on 06/21/2015 03/19/15   Wells Guiles S Tat, DO  glucose 4 GM chewable tablet Chew 1-2 tablets by mouth as needed for low blood sugar. Reported on 02/18/2015    Historical Provider, MD  insulin regular (NOVOLIN R,HUMULIN R) 100 units/mL injection Inject 5-7 Units into the skin 3 (three) times daily before meals. 5-7 units according to sliding scale    Historical  Provider, MD  Multiple Vitamins-Minerals (OCUVITE EXTRA PO) Take 1 tablet by mouth daily.     Historical Provider, MD  polyethylene glycol (MIRALAX / GLYCOLAX) packet Take 17 g by mouth daily.    Historical Provider, MD   BP 131/87 mmHg  Pulse 61  Temp(Src) 97.4 F (36.3 C) (Oral)  Resp 14  SpO2 100% Physical Exam  Constitutional: He is oriented to person, place, and time. He appears well-developed.  HENT:  Head: Normocephalic.  Eyes: Conjunctivae and EOM are normal. No scleral icterus.  Neck: Neck supple. No thyromegaly present.  Cardiovascular: Normal rate and regular rhythm.  Exam reveals no gallop and no  friction rub.   No murmur heard. Pulmonary/Chest: No stridor. He has no wheezes. He has no rales. He exhibits no tenderness.  Abdominal: He exhibits no distension. There is no tenderness. There is no rebound.  Musculoskeletal: Normal range of motion. He exhibits no edema.  Lymphadenopathy:    He has no cervical adenopathy.  Neurological: He is oriented to person, place, and time. He exhibits normal muscle tone. Coordination normal.  Skin: No rash noted. No erythema.  Psychiatric: He has a normal mood and affect. His behavior is normal.    ED Course  Procedures (including critical care time) Labs Review Labs Reviewed  URINALYSIS, ROUTINE W REFLEX MICROSCOPIC (NOT AT [facility]) - Abnormal; Notable for the following:    Hgb urine dipstick SMALL (*)    Protein, ur 30 (*)    All other components within normal limits  URINE MICROSCOPIC-ADD ON - Abnormal; Notable for the following:    Bacteria, UA RARE (*)    All other components within normal limits  I-STAT CHEM 8, ED - Abnormal; Notable for the following:    Potassium 3.1 (*)    Chloride 100 (*)    BUN 29 (*)    Glucose, Bld 147 (*)    All other components within normal limits  CBG MONITORING, ED - Abnormal; Notable for the following:    Glucose-Capillary 123 (*)    All other components within normal limits  CBG MONITORING,  ED - Abnormal; Notable for the following:    Glucose-Capillary 155 (*)    All other components within normal limits    Imaging Review No results found. I have personally reviewed and evaluated these images and lab results as part of my medical decision-making.   EKG Interpretation None      MDM   Final diagnoses:  Hypoglycemia    Patient with episode of hypoglycemia since he did not have his lunch today. Patient was given glucose here also food he has done well sugars have stayed normal patient will follow-up with his PCP    Milton Ferguson, MD 06/21/15 2002

## 2015-07-08 DIAGNOSIS — M545 Low back pain: Secondary | ICD-10-CM | POA: Diagnosis not present

## 2015-07-08 DIAGNOSIS — M7138 Other bursal cyst, other site: Secondary | ICD-10-CM | POA: Diagnosis not present

## 2015-07-08 DIAGNOSIS — M5416 Radiculopathy, lumbar region: Secondary | ICD-10-CM | POA: Diagnosis not present

## 2015-07-08 DIAGNOSIS — G231 Progressive supranuclear ophthalmoplegia [Steele-Richardson-Olszewski]: Secondary | ICD-10-CM | POA: Diagnosis not present

## 2015-07-16 DIAGNOSIS — R278 Other lack of coordination: Secondary | ICD-10-CM | POA: Diagnosis not present

## 2015-07-16 DIAGNOSIS — G20 Parkinson's disease: Secondary | ICD-10-CM | POA: Diagnosis not present

## 2015-07-29 ENCOUNTER — Encounter: Payer: Self-pay | Admitting: Podiatry

## 2015-07-29 ENCOUNTER — Ambulatory Visit (INDEPENDENT_AMBULATORY_CARE_PROVIDER_SITE_OTHER): Payer: Medicare Other | Admitting: Podiatry

## 2015-07-29 ENCOUNTER — Ambulatory Visit (INDEPENDENT_AMBULATORY_CARE_PROVIDER_SITE_OTHER): Payer: Medicare Other | Admitting: Physician Assistant

## 2015-07-29 VITALS — BP 116/80 | HR 78 | Temp 98.2°F | Resp 18 | Ht 68.0 in | Wt 158.0 lb

## 2015-07-29 VITALS — BP 174/99 | HR 64 | Resp 16

## 2015-07-29 DIAGNOSIS — H6121 Impacted cerumen, right ear: Secondary | ICD-10-CM | POA: Diagnosis not present

## 2015-07-29 DIAGNOSIS — IMO0002 Reserved for concepts with insufficient information to code with codable children: Secondary | ICD-10-CM

## 2015-07-29 DIAGNOSIS — L600 Ingrowing nail: Secondary | ICD-10-CM | POA: Diagnosis not present

## 2015-07-29 DIAGNOSIS — L03039 Cellulitis of unspecified toe: Secondary | ICD-10-CM | POA: Diagnosis not present

## 2015-07-29 NOTE — Progress Notes (Signed)
07/29/2015 12:34 PM   DOB: March 08, 1931 / MRN: [id number]  SUBJECTIVE:  [name] is a 82 y.o. male presenting for cerumen impaction on the right side. Reports he was just at his audiologist and they told him the right side was impacted and advised he come here.  He does complain of a decrease in hearing on the right side but denies pain, fever, chills, nausea and otorrhea.   He is allergic to colchicine; atorvastatin; ceftin; celebrex; codeine; erythromycin; ezetimibe; hibiclens; nitroglycerin; oxycodone-acetaminophen; rosuvastatin; simvastatin; vantin; cefpodoxime; cefuroxime axetil; contrast media; and tetanus toxoid.   He  has a past medical history of History of skin cancer; Hypothyroidism; Asthmatic bronchitis; Allergic rhinitis; Urticaria; Dyslipidemia; DM type 2 (diabetes mellitus, type 2) ([place]); Macular degeneration; Dysrhythmia; Arthritis; GERD (gastroesophageal reflux disease); Prostate cancer ([place]); Urgency-frequency syndrome; History of kidney stones; Hearing impaired; DVT, lower extremity, proximal, acute ([place]); Stroke [facility]); Hypertension; and Chronic kidney disease.    He  reports that he has never smoked. He has never used smokeless tobacco. He reports that he does not drink alcohol or use illicit drugs. He  reports that he does not currently engage in sexual activity. The patient  has past surgical history that includes Popliteal synovial cyst excision (2005); Gallbladder surgery (2006); Total knee arthroplasty (2008); Total knee arthroplasty (2010); Partial thymectomy (1985); Lung removal, partial (age 79); cataract surgery (Bilateral); Colonoscopy w/ polypectomy; Cystoscopy with retrograde pyelogram, ureteroscopy and stent placement; Back surgery; Eye surgery; Hernia repair; and Cystoscopy with injection (N/A, 09/11/2013).  His family history includes Heart attack in his father; Heart disease in his father; Hypertension in his mother; Other in his sister; Stroke (age of onset: 56)  in his mother.  Review of Systems  Constitutional: Negative for fever and chills.  Cardiovascular: Negative for chest pain.  Gastrointestinal: Negative for nausea.  Musculoskeletal: Negative for myalgias.  Skin: Negative for itching and rash.  Neurological: Negative for dizziness and headaches.    Problem list and medications reviewed and updated by myself where necessary, and exist elsewhere in the encounter.   OBJECTIVE:  BP 116/80 mmHg  Pulse 78  Temp(Src) 98.2 F (36.8 C) (Oral)  Resp 18  Ht 5\' 8"  (1.727 m)  Wt 158 lb (71.668 kg)  BMI 24.03 kg/m2  SpO2 98%  Physical Exam  HENT:  Ears:  Nose: Nose normal.  Mouth/Throat: Uvula is midline, oropharynx is clear and moist and mucous membranes are normal.  Cardiovascular: Normal rate.   Pulmonary/Chest: Effort normal.  Lymphadenopathy:       Head (right side): No preauricular and no posterior auricular adenopathy present.       Head (left side): No preauricular and no posterior auricular adenopathy present.    He has no cervical adenopathy.    No results found for this or any previous visit (from the past 72 hour(s)).  No results found.  ASSESSMENT AND PLAN  [name] was seen today for cerumen impaction.  Diagnoses and all orders for this visit:  Cerumen impaction, right: Lavaged in the office with resolution of impaction. TM pearly grey with good cone of light. RTC as needed.  -     Ear wax removal     The patient was advised to call or return to clinic if he does not see an improvement in symptoms, or to seek the care of the closest emergency department if he worsens with the above plan.   Philis Fendt, MHS, PA-C [facility] 07/29/2015  12:34 PM

## 2015-07-30 DIAGNOSIS — G20 Parkinson's disease: Secondary | ICD-10-CM | POA: Diagnosis not present

## 2015-07-30 DIAGNOSIS — R278 Other lack of coordination: Secondary | ICD-10-CM | POA: Diagnosis not present

## 2015-07-30 DIAGNOSIS — H353221 Exudative age-related macular degeneration, left eye, with active choroidal neovascularization: Secondary | ICD-10-CM | POA: Diagnosis not present

## 2015-07-31 NOTE — Progress Notes (Signed)
Subjective:     Patient ID: [name], male   DOB: 27-Oct-1931, 82 y.o.   MRN: [id number]  HPI patient presents with chronic nail disease of the big toes both feet with both the inside and outside borders being involved and painful when palpated   Review of Systems     Objective:   Physical Exam Neurovascular status found to be intact and unchanged from previous visit with range of motion diminished subtalar midtarsal joint. Patient's found to have incurvation of the medial lateral borders of both big toes with distal redness and localized drainage with no proximal edema erythema or drainage noted    Assessment:     Chronic paronychia infection of both feet medial lateral border hallux that we cannot correct permanently due to overall circulatory disease    Plan:     H&P condition reviewed and recommended removal of the corners. I infiltrated each hallux 60 mg like Marcaine mixture remove the medial and lateral border of each hallux distal proud flesh abscess tissue and allowed channel spur drainage. Instructed on soaks and reappoint

## 2015-08-05 ENCOUNTER — Ambulatory Visit (INDEPENDENT_AMBULATORY_CARE_PROVIDER_SITE_OTHER): Payer: Medicare Other | Admitting: Internal Medicine

## 2015-08-05 ENCOUNTER — Encounter: Payer: Self-pay | Admitting: Internal Medicine

## 2015-08-05 VITALS — BP 118/74 | HR 81 | Ht 70.0 in | Wt 154.2 lb

## 2015-08-05 DIAGNOSIS — I639 Cerebral infarction, unspecified: Secondary | ICD-10-CM | POA: Diagnosis not present

## 2015-08-05 DIAGNOSIS — J310 Chronic rhinitis: Secondary | ICD-10-CM | POA: Diagnosis not present

## 2015-08-05 MED ORDER — AZELASTINE-FLUTICASONE 137-50 MCG/ACT NA SUSP: 1.0000 | Freq: Every day | NASAL | 99 refills | Status: DC

## 2015-08-05 MED ORDER — ALBUTEROL SULFATE HFA 108 (90 BASE) MCG/ACT IN AERS: 1.0000 | INHALATION_SPRAY | Freq: Four times a day (QID) | RESPIRATORY_TRACT | 12 refills | Status: DC | PRN

## 2015-08-05 NOTE — Patient Instructions (Signed)
Script sent refilling albuterol rescue inhaler  Script sent for Dymista nasal spray to use 1-2 puffs each nostril once daily at bedtime, while needed

## 2015-08-05 NOTE — Assessment & Plan Note (Signed)
Vague somatic complaints which I think mostly reflect frailty of this 82 year old man with multiple morbidities.

## 2015-08-05 NOTE — Progress Notes (Signed)
Patient ID: [name], male    DOB: Jan 17, 1931, 82 y.o.   MRN: [id number]  HPI Last here Decmber 6, 2011. Wive here now. He is going to [place] for second opinion about ? Parkinsons.  He notices that he coughs more, with some sputum, if he lies on right side. No recent antibiotics.  Increased watery nose with Spring pollen.  CXR- 12/25/09- clear, NAD.  Asks refill generic ambien for occasional nonspecific insomnia.   11/17/10- 78 yoM never smoker followed for recurrent bronchitis, complicated by hx urticaria, PAFIB, DM. Wife here Has had flu vaccine. He had a recent upper respiratory infection with watery rhinorrhea headache and malaise because of which she spent a few days in the house and in bed. He took Benadryl and Tylenol. That illness is largely resolved. He still has nasal congestion and some cough producing clear mucus. He has not been needing his rescue inhaler. He asks for a Depo-Medrol injection and we discussed his blood sugar status. He likes benzonatate for cough.  01/14/11- 78 yoM never smoker followed for recurrent bronchitis, complicated by hx urticaria, PAFIB, DM, Chronic insomnia.   Wife here Acute visit-cough; productive-white and thick, sinus drainage, wheezing as well-worse at night Has had flu vaccine. Went to [facility] emergency room on Christmas Eve with malaise. He was treated with continuous nebulizer, intravenous steroids and prednisone taper. The next day he had some fever which is resolved. He complains of persistent postnasal drip, dry cough and malaise. With hard coughing he has had some sharp left lateral chest wall pain. Sleeping sitting up. His raspy breathing noise worries him. He is taking Delsym, Mucinex D. and says antibiotics have not helped. Still on prednisone. For his problem of chronic insomnia-complains he can't sleep. Waiting for prior authorization to renew his Ambien and can't sleep without it.  03/02/11-  78 yoM never smoker followed for  recurrent bronchitis, complicated by hx urticaria, PAFIB, DM, Chronic insomnia.  C/O cough getting worse-night time; unable to sleep; ENT 3 weeks ago-was told to use salt water mixture in nose. He has been coughing for 6 weeks. His ENT doctor told him to take Prilosec twice daily and that did seem to help initially. In the last 2 weeks cough is worse again, productive of scant white sputum. Taking benzonatate. Much rhinorrhea. Denies any reflux or choking with food but then admits he swallows bread after pills to make them go down.  05/30/12-  80 yoM never smoker followed for recurrent bronchitis, complicated by hx urticaria, PAFIB, DM, Chronic insomnia.  Wife here FOLLOWS FOR: sinus drainage and slight cough at times; Denies any wheezing or  SOB. Scheduled f/u without acute concerns. No urticaria. Some pndrip and throat clearing. Little cough and no wheeze. No concerns about meds or pollen season.  04/24/13- 80 yoM never smoker followed for recurrent bronchitis, complicated by hx urticaria, PAFIB, DM, Chronic insomnia.  Wife here FOLLOWS FOR: acute visit. C/o increase in cough and unable to produce mucus with cough. C/o chest soreness from coughing  and SOB with activity.  2-3 weeks much dry cough and wheeze when lying down. Rescue inhaler not enough help. Postnasal drip. Prednisone +/- help.  12/22/13- 80 yoM never smoker followed for recurrent bronchitis, complicated by hx urticaria, PAFIB, DM, Chronic insomnia.  Wife here FOLLOWS FOR: coughs up thick/clear phelgm after eating; runny nose as well. Pt states his breathing is doing fine.   Dymista sample was not enough help. Nasal congestion and watery nose  are triggered by exposure to cold such as cold drink or food. He asks refill Ambien for his chronic insomnia. He has been tolerating this well without confusion or recognized problems. We discussed safety again.  08/03/14- 80 yoM never smoker followed for recurrent bronchitis, complicated by hx  urticaria, PAFIB, DM, Chronic insomnia.  Wife here Follows for: Still increased cough with grey mucus. Denies any wheezing or SOB.  An acute bronchitis responded to antibiotics but left him with residual postnasal drip and cough productive of small amounts of clear sputum. No fever or wheezing. He has used Mucinex DM. Asks for Depo-Medrol. We discussed steroid effect on his diabetes. He feels this has been the most successful approach for stopping his persistent bronchitis. Also has some persistent nasal stuffiness partly related to known septal deviation. CXR 05/26/14- FINDINGS: Lungs are adequately inflated without focal consolidation or effusion. There is no pneumothorax. Cardiomediastinal silhouette is within normal. There is minimal calcification over the aortic arch. There are degenerative changes of the spine. Chronic stable deformity of the left seventh rib. IMPRESSION: No active cardiopulmonary disease. Electronically Signed  By: Marin Olp M.D.  On: 05/26/2014 21:21  02/04/2015-82 year old male never smoker followed for recurrent bronchitis, complicated by history urticaria, PAFIB, DM, chronic insomnia FOLLOWS FOR: Pt states he is more "short-winded"; stays about the same all the time. Also, having sinus drainage; coughs after eating as well. No energy. Wife states pt vomitted through the night about 5 days ago with stomach issues. Left nostril stays stopped up with little nasal discharge. Easy dyspnea on exertion. Coughs more frequently after eating now. Insomnia-uses Ambien occasionally , requesting refill. We discussed safety age and alternatives.  03/18/2015-82 year old male never smoker followed for recurrent bronchitis, complicated by history urticaria, PA fib, DM, chronic insomnia ACUTE VISIT: Pt states he has runny nose, cough, headache, sore throat. Wife states unsure of any fever. Head cold is becoming bronchitis on top of his chronic postnasal drip and cough. 3 days  acute illness began with sore throat and head congestion. CXR 10-2014-NAD  08/05/2015-82 year old male never smoker followed for recurrent bronchitis, complicated by history urticaria, PAFib, DM, chronic insomnia FOLLOWS FOR: Pt states he has trouble with breathing with exertion and no particular time of day that is worse. Pt also has PND all the time. Wife here No evident new concerns. Remains bothered by his sensation of postnasal drip and shortness of breath with exertion which are perennial without acute exacerbation. Podiatrist treating infection on foot after ingrown toenail. CXR 04/21/2015- stable peripheral left base scarring IMPRESSION: No acute cardiopulmonary disease. Stable appearance from the prior study. Electronically Signed   By: Lajean Manes M.D.   On: 04/21/2015 10:58  Review of Systems-see HPI Constitutional:   No-   weight loss, night sweats, fevers, chills, fatigue, lassitude. HEENT:   No-  headaches, difficulty swallowing, tooth/dental problems, sore throat,       No-  sneezing, itching, ear ache, +nasal congestion, +post nasal drip,  CV:  No-   chest pain, orthopnea, PND, swelling in lower extremities, anasarca, or dizziness, palpitations Resp: +   shortness of breath with exertion or at rest.            + productive cough,  No non-productive cough,  No- coughing up of blood.              No-   change in color of mucus.  No- wheezing.   Skin: No-   rash or lesions. GI:  No-  heartburn, indigestion, abdominal pain, nausea, vomiting, GU:  MS:  No-   joint pain or swelling.    . Neuro-     nothing unusual Psych:  No- change in mood or affect. No depression or anxiety.  No memory loss.  Objective:   Physical Exam General- Alert, Oriented, Affect-appropriate, Distress- none acute, + frail elderly man Skin- rash-none, lesions- none, excoriation- none Lymphadenopathy- none Head- atraumatic            Eyes- Gross vision intact, PERRLA, conjunctivae clear  secretions            Ears- Hearing aid, +HOH            Nose- +pale and watery, Septal dev +, mucus, polyps, erosion, perforation             Throat- Mallampati II-III  , mucus+ clear , drainage- none, tonsils- atrophic Neck- flexible , trachea midline, no stridor , thyroid nl, carotid no bruit Chest - symmetrical excursion , unlabored           Heart/CV- RRR , no murmur , no gallop  , no rub, nl s1 s2                           - JVD- none , edema- none, stasis changes- none, varices- none           Lung- clear unlabored, cough+raspy/deep , dullness-none, rub- none           Chest wall- Abd- Br/ Gen/ Rectal- Not done, not indicated Extrem- cyanosis- none, clubbing, none, atrophy- none, strength- nl Neuro- grossly intact to observation, hesitant speech

## 2015-08-05 NOTE — Assessment & Plan Note (Signed)
Not convinced that this is allergic drainage. We discussed nasal saline rinse. Plan-try again with Dymista nasal spray

## 2015-08-07 ENCOUNTER — Ambulatory Visit (INDEPENDENT_AMBULATORY_CARE_PROVIDER_SITE_OTHER): Payer: Medicare Other | Admitting: Podiatry

## 2015-08-07 DIAGNOSIS — I639 Cerebral infarction, unspecified: Secondary | ICD-10-CM | POA: Diagnosis not present

## 2015-08-07 DIAGNOSIS — L600 Ingrowing nail: Secondary | ICD-10-CM

## 2015-08-07 DIAGNOSIS — L03019 Cellulitis of unspecified finger: Secondary | ICD-10-CM | POA: Diagnosis not present

## 2015-08-07 DIAGNOSIS — IMO0002 Reserved for concepts with insufficient information to code with codable children: Secondary | ICD-10-CM

## 2015-08-08 DIAGNOSIS — R269 Unspecified abnormalities of gait and mobility: Secondary | ICD-10-CM | POA: Diagnosis not present

## 2015-08-08 NOTE — Progress Notes (Signed)
Subjective:     Patient ID: [name], male   DOB: 1931-12-15, 82 y.o.   MRN: [id number]  HPI patient presents with wife concerned because there still is irritation of the left big toe on the lateral side that he is still having trouble with shoe gear   Review of Systems     Objective:   Physical Exam Neurovascular status intact muscle strength adequate with patient noted to have some keratotic distal tissue and irritation of the left hallux lateral border with slight redness but no active drainage    Assessment:     May be a mild ingrown toenail still with keratotic tissue formation which may be irritating    Plan:     Debrided nails and tissue and applied padding to reduce pressure and advised on soaks and if symptoms persist may need to re-anesthetized the toe and be more aggressive

## 2015-08-13 ENCOUNTER — Telehealth: Payer: Self-pay | Admitting: *Deleted

## 2015-08-13 NOTE — Telephone Encounter (Signed)
Called patient at [phone] (Home #) to check to see how they were doing from their Paronychia procedure that was performed on Monday, July 29, 2015. Pt stated, "Doing good and not in any pain".

## 2015-08-14 DIAGNOSIS — G20 Parkinson's disease: Secondary | ICD-10-CM | POA: Diagnosis not present

## 2015-08-14 DIAGNOSIS — R278 Other lack of coordination: Secondary | ICD-10-CM | POA: Diagnosis not present

## 2015-08-16 DIAGNOSIS — R278 Other lack of coordination: Secondary | ICD-10-CM | POA: Diagnosis not present

## 2015-08-16 DIAGNOSIS — G20 Parkinson's disease: Secondary | ICD-10-CM | POA: Diagnosis not present

## 2015-08-19 DIAGNOSIS — R278 Other lack of coordination: Secondary | ICD-10-CM | POA: Diagnosis not present

## 2015-08-19 DIAGNOSIS — G20 Parkinson's disease: Secondary | ICD-10-CM | POA: Diagnosis not present

## 2015-08-21 DIAGNOSIS — R278 Other lack of coordination: Secondary | ICD-10-CM | POA: Diagnosis not present

## 2015-08-21 DIAGNOSIS — G20 Parkinson's disease: Secondary | ICD-10-CM | POA: Diagnosis not present

## 2015-08-22 ENCOUNTER — Other Ambulatory Visit: Payer: Self-pay | Admitting: Internal Medicine

## 2015-08-22 ENCOUNTER — Telehealth: Payer: Self-pay | Admitting: Internal Medicine

## 2015-08-22 MED ORDER — ZOLPIDEM TARTRATE 5 MG PO TABS: 5.0000 mg | ORAL_TABLET | Freq: Every evening | ORAL | 5 refills | Status: DC | PRN

## 2015-08-22 NOTE — Telephone Encounter (Signed)
Called and spoke with pts wife and she stated that the pt needs refill of the [place].  CY please advise if ok to send in refills. Thanks  Last ov--08/05/15 Next ov--02/06/16  Allergies  Allergen Reactions  . Colchicine Anaphylaxis  . Atorvastatin Other (See Comments)    Joint soreness  . Ceftin Diarrhea  . Celebrex [Celecoxib]     edema  . Codeine Hives  . Erythromycin Diarrhea  . Ezetimibe Other (See Comments)    Joint soreness  . Hibiclens [Chlorhexidine] Hives    Cannot use must use ivory soap  . Nitroglycerin Other (See Comments)    lowers blood pressure   . Oxycodone-Acetaminophen Hives  . Rosuvastatin Other (See Comments)    Joint soreness  . Simvastatin Other (See Comments)    Joint soreness  . Vantin Other (See Comments)    Doesn't remember  . Cefpodoxime Rash  . Cefuroxime Axetil Rash  . Contrast Media [Iodinated Diagnostic Agents] Nausea And Vomiting and Rash  . Tetanus Toxoid Rash

## 2015-08-22 NOTE — Telephone Encounter (Signed)
Called and spoke to pt's wife, [name]. Informed [name] of the refill and that the rx is up front ready for pick up. [name] verbalized understanding and denied any further questions or concerns at this time.

## 2015-08-22 NOTE — Telephone Encounter (Signed)
Ok to refill 

## 2015-08-23 ENCOUNTER — Telehealth: Payer: Self-pay | Admitting: Internal Medicine

## 2015-08-23 DIAGNOSIS — R32 Unspecified urinary incontinence: Secondary | ICD-10-CM | POA: Diagnosis not present

## 2015-08-23 DIAGNOSIS — R8279 Other abnormal findings on microbiological examination of urine: Secondary | ICD-10-CM | POA: Diagnosis not present

## 2015-08-23 DIAGNOSIS — R278 Other lack of coordination: Secondary | ICD-10-CM | POA: Diagnosis not present

## 2015-08-23 DIAGNOSIS — G20 Parkinson's disease: Secondary | ICD-10-CM | POA: Diagnosis not present

## 2015-08-23 DIAGNOSIS — N3281 Overactive bladder: Secondary | ICD-10-CM | POA: Diagnosis not present

## 2015-08-23 MED ORDER — ZOLPIDEM TARTRATE 5 MG PO TABS: 5.0000 mg | ORAL_TABLET | Freq: Every evening | ORAL | 5 refills | Status: DC | PRN

## 2015-08-23 NOTE — Telephone Encounter (Signed)
error 

## 2015-08-23 NOTE — Addendum Note (Signed)
Addended by: Parke Poisson E on: 08/23/2015 01:28 PM   Modules accepted: Orders

## 2015-08-23 NOTE — Telephone Encounter (Signed)
Pt called again, speaking with Vida Roller - she is at [facility] now, upset that the Rx was not called in to the pharmacy Per pt's chart, she was advised to pick up Rx yesterday 8.10.17 Pt is requesting to have this called in  [facility], spoke with pharmacist Sunday Spillers - Rx given verbally as okayed/signed by [place] list updated Nothing further needed at this time

## 2015-08-27 DIAGNOSIS — H353211 Exudative age-related macular degeneration, right eye, with active choroidal neovascularization: Secondary | ICD-10-CM | POA: Diagnosis not present

## 2015-09-02 DIAGNOSIS — D485 Neoplasm of uncertain behavior of skin: Secondary | ICD-10-CM | POA: Diagnosis not present

## 2015-10-03 DIAGNOSIS — D692 Other nonthrombocytopenic purpura: Secondary | ICD-10-CM | POA: Diagnosis not present

## 2015-10-03 DIAGNOSIS — I129 Hypertensive chronic kidney disease with stage 1 through stage 4 chronic kidney disease, or unspecified chronic kidney disease: Secondary | ICD-10-CM | POA: Diagnosis not present

## 2015-10-03 DIAGNOSIS — R269 Unspecified abnormalities of gait and mobility: Secondary | ICD-10-CM | POA: Diagnosis not present

## 2015-10-03 DIAGNOSIS — Z79899 Other long term (current) drug therapy: Secondary | ICD-10-CM | POA: Diagnosis not present

## 2015-10-03 DIAGNOSIS — E7800 Pure hypercholesterolemia, unspecified: Secondary | ICD-10-CM | POA: Diagnosis not present

## 2015-10-03 DIAGNOSIS — Z23 Encounter for immunization: Secondary | ICD-10-CM | POA: Diagnosis not present

## 2015-10-03 DIAGNOSIS — N183 Chronic kidney disease, stage 3 (moderate): Secondary | ICD-10-CM | POA: Diagnosis not present

## 2015-10-03 DIAGNOSIS — E1122 Type 2 diabetes mellitus with diabetic chronic kidney disease: Secondary | ICD-10-CM | POA: Diagnosis not present

## 2015-10-03 DIAGNOSIS — Z794 Long term (current) use of insulin: Secondary | ICD-10-CM | POA: Diagnosis not present

## 2015-10-08 DIAGNOSIS — H353211 Exudative age-related macular degeneration, right eye, with active choroidal neovascularization: Secondary | ICD-10-CM | POA: Diagnosis not present

## 2015-10-08 DIAGNOSIS — R278 Other lack of coordination: Secondary | ICD-10-CM | POA: Diagnosis not present

## 2015-10-08 DIAGNOSIS — G20 Parkinson's disease: Secondary | ICD-10-CM | POA: Diagnosis not present

## 2015-10-09 DIAGNOSIS — R278 Other lack of coordination: Secondary | ICD-10-CM | POA: Diagnosis not present

## 2015-10-09 DIAGNOSIS — G20 Parkinson's disease: Secondary | ICD-10-CM | POA: Diagnosis not present

## 2015-10-11 DIAGNOSIS — G20 Parkinson's disease: Secondary | ICD-10-CM | POA: Diagnosis not present

## 2015-10-11 DIAGNOSIS — R278 Other lack of coordination: Secondary | ICD-10-CM | POA: Diagnosis not present

## 2015-10-15 DIAGNOSIS — R278 Other lack of coordination: Secondary | ICD-10-CM | POA: Diagnosis not present

## 2015-10-15 DIAGNOSIS — G20 Parkinson's disease: Secondary | ICD-10-CM | POA: Diagnosis not present

## 2015-10-17 DIAGNOSIS — R278 Other lack of coordination: Secondary | ICD-10-CM | POA: Diagnosis not present

## 2015-10-17 DIAGNOSIS — G20 Parkinson's disease: Secondary | ICD-10-CM | POA: Diagnosis not present

## 2015-10-23 ENCOUNTER — Emergency Department (HOSPITAL_COMMUNITY): Payer: Medicare Other

## 2015-10-23 ENCOUNTER — Observation Stay (HOSPITAL_COMMUNITY)
Admission: EM | Admit: 2015-10-23 | Discharge: 2015-10-25 | Disposition: A | Discharge status: Skilled Nursing Facility | Payer: Medicare Other | Attending: Internal Medicine | Admitting: Internal Medicine

## 2015-10-23 ENCOUNTER — Encounter (HOSPITAL_COMMUNITY): Payer: Self-pay | Admitting: Emergency Medicine

## 2015-10-23 DIAGNOSIS — Z91041 Radiographic dye allergy status: Secondary | ICD-10-CM | POA: Diagnosis not present

## 2015-10-23 DIAGNOSIS — E890 Postprocedural hypothyroidism: Secondary | ICD-10-CM | POA: Insufficient documentation

## 2015-10-23 DIAGNOSIS — Z96653 Presence of artificial knee joint, bilateral: Secondary | ICD-10-CM | POA: Diagnosis not present

## 2015-10-23 DIAGNOSIS — R680 Hypothermia, not associated with low environmental temperature: Secondary | ICD-10-CM | POA: Diagnosis not present

## 2015-10-23 DIAGNOSIS — Z85828 Personal history of other malignant neoplasm of skin: Secondary | ICD-10-CM | POA: Insufficient documentation

## 2015-10-23 DIAGNOSIS — E162 Hypoglycemia, unspecified: Secondary | ICD-10-CM | POA: Diagnosis present

## 2015-10-23 DIAGNOSIS — R4182 Altered mental status, unspecified: Secondary | ICD-10-CM | POA: Diagnosis not present

## 2015-10-23 DIAGNOSIS — R531 Weakness: Secondary | ICD-10-CM | POA: Diagnosis not present

## 2015-10-23 DIAGNOSIS — G9340 Encephalopathy, unspecified: Secondary | ICD-10-CM

## 2015-10-23 DIAGNOSIS — E039 Hypothyroidism, unspecified: Secondary | ICD-10-CM | POA: Diagnosis present

## 2015-10-23 DIAGNOSIS — G9341 Metabolic encephalopathy: Secondary | ICD-10-CM | POA: Diagnosis not present

## 2015-10-23 DIAGNOSIS — R7309 Other abnormal glucose: Secondary | ICD-10-CM | POA: Diagnosis not present

## 2015-10-23 DIAGNOSIS — I129 Hypertensive chronic kidney disease with stage 1 through stage 4 chronic kidney disease, or unspecified chronic kidney disease: Secondary | ICD-10-CM | POA: Insufficient documentation

## 2015-10-23 DIAGNOSIS — I739 Peripheral vascular disease, unspecified: Secondary | ICD-10-CM | POA: Diagnosis not present

## 2015-10-23 DIAGNOSIS — R410 Disorientation, unspecified: Secondary | ICD-10-CM | POA: Diagnosis not present

## 2015-10-23 DIAGNOSIS — E1122 Type 2 diabetes mellitus with diabetic chronic kidney disease: Secondary | ICD-10-CM | POA: Diagnosis not present

## 2015-10-23 DIAGNOSIS — Z794 Long term (current) use of insulin: Secondary | ICD-10-CM | POA: Diagnosis not present

## 2015-10-23 DIAGNOSIS — E785 Hyperlipidemia, unspecified: Secondary | ICD-10-CM | POA: Insufficient documentation

## 2015-10-23 DIAGNOSIS — E11649 Type 2 diabetes mellitus with hypoglycemia without coma: Secondary | ICD-10-CM | POA: Diagnosis not present

## 2015-10-23 DIAGNOSIS — Z86718 Personal history of other venous thrombosis and embolism: Secondary | ICD-10-CM | POA: Insufficient documentation

## 2015-10-23 DIAGNOSIS — G20 Parkinson's disease: Secondary | ICD-10-CM | POA: Insufficient documentation

## 2015-10-23 DIAGNOSIS — N183 Chronic kidney disease, stage 3 (moderate): Secondary | ICD-10-CM | POA: Diagnosis not present

## 2015-10-23 DIAGNOSIS — T68XXXA Hypothermia, initial encounter: Secondary | ICD-10-CM | POA: Diagnosis not present

## 2015-10-23 DIAGNOSIS — Z7982 Long term (current) use of aspirin: Secondary | ICD-10-CM | POA: Diagnosis not present

## 2015-10-23 DIAGNOSIS — Z8546 Personal history of malignant neoplasm of prostate: Secondary | ICD-10-CM | POA: Diagnosis not present

## 2015-10-23 DIAGNOSIS — E119 Type 2 diabetes mellitus without complications: Secondary | ICD-10-CM

## 2015-10-23 DIAGNOSIS — I10 Essential (primary) hypertension: Secondary | ICD-10-CM | POA: Diagnosis present

## 2015-10-23 DIAGNOSIS — E161 Other hypoglycemia: Secondary | ICD-10-CM | POA: Diagnosis not present

## 2015-10-23 DIAGNOSIS — E871 Hypo-osmolality and hyponatremia: Secondary | ICD-10-CM | POA: Insufficient documentation

## 2015-10-23 DIAGNOSIS — R279 Unspecified lack of coordination: Secondary | ICD-10-CM

## 2015-10-23 LAB — CBC WITH DIFFERENTIAL/PLATELET
BASOS ABS: 0 10*3/uL (ref 0.0–0.1)
BASOS PCT: 1 %
EOS PCT: 1 %
Eosinophils Absolute: 0.1 10*3/uL (ref 0.0–0.7)
HCT: 40.8 % (ref 39.0–52.0)
Hemoglobin: 13.7 g/dL (ref 13.0–17.0)
Lymphocytes Relative: 18 %
Lymphs Abs: 1.3 10*3/uL (ref 0.7–4.0)
MCH: 31.1 pg (ref 26.0–34.0)
MCHC: 33.6 g/dL (ref 30.0–36.0)
MCV: 92.5 fL (ref 78.0–100.0)
MONO ABS: 0.6 10*3/uL (ref 0.1–1.0)
Monocytes Relative: 8 %
Neutro Abs: 5 10*3/uL (ref 1.7–7.7)
Neutrophils Relative %: 72 %
PLATELETS: 206 10*3/uL (ref 150–400)
RBC: 4.41 MIL/uL (ref 4.22–5.81)
RDW: 12.7 % (ref 11.5–15.5)
WBC: 7 10*3/uL (ref 4.0–10.5)

## 2015-10-23 LAB — URINE MICROSCOPIC-ADD ON
RBC / HPF: NONE SEEN RBC/hpf (ref 0–5)
WBC, UA: NONE SEEN WBC/hpf (ref 0–5)

## 2015-10-23 LAB — URINALYSIS, ROUTINE W REFLEX MICROSCOPIC
Bilirubin Urine: NEGATIVE
Glucose, UA: 100 mg/dL — AB
Hgb urine dipstick: NEGATIVE
KETONES UR: NEGATIVE mg/dL
LEUKOCYTES UA: NEGATIVE
NITRITE: NEGATIVE
PROTEIN: 30 mg/dL — AB
Specific Gravity, Urine: 1.014 (ref 1.005–1.030)
pH: 5.5 (ref 5.0–8.0)

## 2015-10-23 LAB — COMPREHENSIVE METABOLIC PANEL
ALT: 7 U/L — AB (ref 17–63)
AST: 19 U/L (ref 15–41)
Albumin: 3.5 g/dL (ref 3.5–5.0)
Alkaline Phosphatase: 57 U/L (ref 38–126)
Anion gap: 10 (ref 5–15)
BILIRUBIN TOTAL: 0.5 mg/dL (ref 0.3–1.2)
BUN: 21 mg/dL — AB (ref 6–20)
CO2: 23 mmol/L (ref 22–32)
CREATININE: 1.26 mg/dL — AB (ref 0.61–1.24)
Calcium: 8.9 mg/dL (ref 8.9–10.3)
Chloride: 100 mmol/L — ABNORMAL LOW (ref 101–111)
GFR, EST AFRICAN AMERICAN: 60 mL/min — AB (ref >60)
GFR, EST NON AFRICAN AMERICAN: 52 mL/min — AB (ref >60)
Glucose, Bld: 301 mg/dL — ABNORMAL HIGH (ref 65–99)
POTASSIUM: 4 mmol/L (ref 3.5–5.1)
Sodium: 133 mmol/L — ABNORMAL LOW (ref 135–145)
TOTAL PROTEIN: 6.1 g/dL — AB (ref 6.5–8.1)

## 2015-10-23 LAB — TROPONIN I

## 2015-10-23 LAB — CBG MONITORING, ED
GLUCOSE-CAPILLARY: 266 mg/dL — AB (ref 65–99)
GLUCOSE-CAPILLARY: 272 mg/dL — AB (ref 65–99)

## 2015-10-23 LAB — I-STAT CG4 LACTIC ACID, ED: LACTIC ACID, VENOUS: 1.81 mmol/L (ref 0.5–1.9)

## 2015-10-23 MED ORDER — SODIUM CHLORIDE 0.9 % IV SOLN
Freq: Once | INTRAVENOUS | Status: AC
  Administered 2015-10-23: 23:00:00 via INTRAVENOUS

## 2015-10-23 NOTE — ED Provider Notes (Signed)
[facility] DEPT Provider Note   CSN: [id number] Arrival date & time: 10/23/15  2001     History   Chief Complaint Chief Complaint  Patient presents with  . Hypoglycemia  . Altered Mental Status    HPI [name] is a 82 y.o. male.  HPI The patient is in assisted independent living. Family members went to visit the patient and he did not come to answer the door. Once they were able to get the door open, patient was found cool and diaphoretic with CBG of 55. He did take orange juice and blood sugars up to 102. He still exhibited confusion and was given an amp of D50. Patient was slow to recover but ultimately it had improved mental status. Patient did not have localizing motor deficit. No additional history at this time. Past Medical History:  Diagnosis Date  . Allergic rhinitis   . Arthritis    arthritis-kyphosis  . Asthmatic bronchitis   . Chronic kidney disease   . DM type 2 (diabetes mellitus, type 2) ([place])   . DVT, lower extremity, proximal, acute ([place])   . Dyslipidemia   . Dysrhythmia    '97 tachycardia- no recent issues  . GERD (gastroesophageal reflux disease)   . Hearing impaired    bilateral hearing aids  . History of kidney stones   . History of skin cancer   . Hypertension   . Hypothyroidism   . Macular degeneration    injections done bilaterally recent - 08-22-13  . Prostate cancer [facility])    Prostate cancer-radiation only,skin cancer-basal cell and last squamous cell right eye  . Stroke ([place])   . Urgency-frequency syndrome   . Urticaria     Patient Active Problem List   Diagnosis Date Noted  . CVA (cerebral vascular accident) ([place]) 10/14/2014  . Prerenal azotemia 10/14/2014  . Mild HTN 10/14/2014  . CVA (cerebral infarction)   . TIA (transient ischemic attack)   . PNAR (perennial non-allergic rhinitis) 05/23/2013  . Seasonal and perennial allergic rhinitis 06/11/2012  . Acute bronchitis due to infection 01/14/2011  . Insomnia, unspecified  05/19/2010  . ATAXIA 03/17/2010  . DIZZINESS 03/06/2010  . Chronic constipation 02/21/2008  . OSTEOARTHRITIS, KNEE, SEVERE 01/12/2008  . HYPERLIPIDEMIA 01/17/2007  . Hypothyroidism (post surgical) 10/15/2006  . SKIN CANCER, HX OF 06/08/2006  . Diabetes mellitus, insulin dependent (IDDM), controlled ([place]) 04/22/2006  . URTICARIA 04/22/2006  . POPLITEAL CYST 04/22/2006  . THYROIDECTOMY, SUBTOTAL, HX OF 04/22/2006  . TOTAL KNEE REPLACEMENT, HX OF 04/22/2006    Past Surgical History:  Procedure Laterality Date  . BACK SURGERY     lumbar fracture  . cataract surgery Bilateral   . COLONOSCOPY W/ POLYPECTOMY     x2   . CYSTOSCOPY WITH INJECTION N/A 09/11/2013   Procedure: CYSTOSCOPY WITH BOTOX INJECTION INTO THE BLADDER;  Surgeon: Ailene Rud, MD;  Location: [facility] ORS;  Service: Urology;  Laterality: N/A;  . CYSTOSCOPY WITH RETROGRADE PYELOGRAM, URETEROSCOPY AND STENT PLACEMENT    . EYE SURGERY     macular degeneration  . GALLBLADDER SURGERY  2006   no cholecystectomy  . HERNIA REPAIR    . LUNG REMOVAL, PARTIAL  age 30   for bronchiectasis  . PARTIAL THYMECTOMY  1985   for nodules  . POPLITEAL SYNOVIAL CYST EXCISION  2005  . TOTAL KNEE ARTHROPLASTY  2008   right  . TOTAL KNEE ARTHROPLASTY  2010   left       Home Medications  Prior to Admission medications   Medication Sig Start Date End Date Taking? Authorizing Provider  aspirin EC 81 MG tablet Take 81 mg by mouth.   Yes Historical Provider, MD  benzonatate (TESSALON) 200 MG capsule Take 200 mg by mouth 3 (three) times daily as needed for cough.   Yes Historical Provider, MD  carbidopa-levodopa (SINEMET IR) 25-100 MG tablet Take 1 tablet by mouth 3 (three) times daily. Takes [id number]   Yes Historical Provider, MD  Cholecalciferol 5000 units TABS Take 5,000 Units by mouth daily.   Yes Historical Provider, MD  colesevelam (WELCHOL) 625 MG tablet Take 1,250 mg by mouth 3 (three) times daily with meals.    Yes  Historical Provider, MD  COMBIGAN 0.2-0.5 % ophthalmic solution Place 1 drop into both eyes every 12 (twelve) hours.  05/25/14  Yes Historical Provider, MD  diltiazem (CARDIZEM CD) 240 MG 24 hr capsule Take 240 mg by mouth every morning.    Yes Historical Provider, MD  furosemide (LASIX) 20 MG tablet Take 20 mg by mouth every morning.    Yes Historical Provider, MD  insulin glargine (LANTUS) 100 UNIT/ML injection Inject 15-20 Units into the skin at bedtime. May increase to 35 units as needed   Yes Historical Provider, MD  insulin regular (NOVOLIN R,HUMULIN R) 100 units/mL injection Inject 5-7 Units into the skin 3 (three) times daily as needed for high blood sugar. 5-7 units according to sliding scale   Yes Historical Provider, MD  levothyroxine (SYNTHROID, LEVOTHROID) 200 MCG tablet Take 200 mcg by mouth daily before breakfast. Total of 265mcg   Yes Historical Provider, MD  levothyroxine (SYNTHROID, LEVOTHROID) 75 MCG tablet Take 75 mcg by mouth daily before breakfast.   Yes Historical Provider, MD  multivitamin-lutein (OCUVITE-LUTEIN) CAPS capsule Take 1 capsule by mouth daily.   Yes Historical Provider, MD  omeprazole (PRILOSEC) 20 MG capsule Take 20 mg by mouth daily.   Yes Historical Provider, MD  polyethylene glycol (MIRALAX / GLYCOLAX) packet Take 17 g by mouth daily.   Yes Historical Provider, MD  albuterol (PROVENTIL HFA;VENTOLIN HFA) 108 (90 Base) MCG/ACT inhaler Inhale 1-2 puffs into the lungs every 6 (six) hours as needed for wheezing or shortness of breath. Patient not taking: Reported on 10/23/2015 08/05/15   Deneise Lever, MD  Azelastine-Fluticasone [facility]) 137-50 MCG/ACT SUSP Place 1-2 puffs into the nose at bedtime. Patient not taking: Reported on 10/23/2015 08/05/15 08/04/16  Deneise Lever, MD  fluticasone [facility]) 50 MCG/ACT nasal spray Place 2 sprays into both nostrils daily. Patient not taking: Reported on 10/23/2015 04/21/15   Drenda Freeze, MD  oxymetazoline [facility] NASAL  SPRAY) 0.05 % nasal spray Place 1 spray into both nostrils 2 (two) times daily. Patient not taking: Reported on 10/23/2015 04/21/15   Drenda Freeze, MD  zolpidem (AMBIEN) 5 MG tablet Take 1 tablet (5 mg total) by mouth at bedtime as needed for sleep. Patient not taking: Reported on 10/23/2015 08/23/15   Deneise Lever, MD    Family History Family History  Problem Relation Age of Onset  . Other Sister     ophth disease  . Heart attack Father     x7  . Heart disease Father     MI x7  . Hypertension Mother   . Stroke Mother 38    Social History Social History  Substance Use Topics  . Smoking status: Never Smoker  . Smokeless tobacco: Never Used  . Alcohol use No  Allergies   Colchicine; Hibiclens [chlorhexidine]; Atorvastatin; Ceftin; Celebrex [celecoxib]; Codeine; Contrast media [iodinated diagnostic agents]; Erythromycin; Ezetimibe; Oxycodone-acetaminophen; Rosuvastatin; Simvastatin; Cefpodoxime; Cefuroxime axetil; Nitroglycerin; Tetanus toxoid; and Vantin   Review of Systems Review of Systems  10 Systems reviewed and are negative for acute change except as noted in the HPI.  Physical Exam Updated Vital Signs BP 142/85   Pulse 66   Temp (!) 95.9 F (35.5 C) (Rectal)   Resp 21   SpO2 98%   Physical Exam  Constitutional: He appears well-developed and well-nourished.  Patient is awake and interacting but does appear slightly delayed in his responses.  HENT:  Head: Normocephalic and atraumatic.  Nose: Nose normal.  Mouth/Throat: Oropharynx is clear and moist.  Eyes: EOM are normal. Pupils are equal, round, and reactive to light.  Neck: Neck supple.  Cardiovascular: Normal rate, regular rhythm, normal heart sounds and intact distal pulses.   Pulmonary/Chest: Effort normal.  Occasional rhonchi clears with cough.  Abdominal: Soft. He exhibits no distension. There is no tenderness. There is no guarding.  Musculoskeletal: He exhibits no edema, tenderness or  deformity.  Neurological: He is alert. No cranial nerve deficit. He exhibits normal muscle tone.  Skin: Skin is warm and dry.     ED Treatments / Results  Labs (all labs ordered are listed, but only abnormal results are displayed) Labs Reviewed  COMPREHENSIVE METABOLIC PANEL - Abnormal; Notable for the following:       Result Value   Sodium 133 (*)    Chloride 100 (*)    Glucose, Bld 301 (*)    BUN 21 (*)    Creatinine, Ser 1.26 (*)    Total Protein 6.1 (*)    ALT 7 (*)    GFR calc non Af Amer 52 (*)    GFR calc Af Amer 60 (*)    All other components within normal limits  URINALYSIS, ROUTINE W REFLEX MICROSCOPIC (NOT AT [facility]) - Abnormal; Notable for the following:    APPearance CLOUDY (*)    Glucose, UA 100 (*)    Protein, ur 30 (*)    All other components within normal limits  URINE MICROSCOPIC-ADD ON - Abnormal; Notable for the following:    Squamous Epithelial / LPF 0-5 (*)    Bacteria, UA RARE (*)    Crystals CA OXALATE CRYSTALS (*)    All other components within normal limits  CBG MONITORING, ED - Abnormal; Notable for the following:    Glucose-Capillary 266 (*)    All other components within normal limits  CBG MONITORING, ED - Abnormal; Notable for the following:    Glucose-Capillary 272 (*)    All other components within normal limits  TROPONIN I  CBC WITH DIFFERENTIAL/PLATELET  CBC WITH DIFFERENTIAL/PLATELET  COMPREHENSIVE METABOLIC PANEL  I-STAT CG4 LACTIC ACID, ED    EKG  EKG Interpretation  Date/Time:  Wednesday October 23 2015 20:05:44 EDT Ventricular Rate:  61 PR Interval:    QRS Duration: 89 QT Interval:  468 QTC Calculation: 472 R Axis:   90 Text Interpretation:  no acute ischemia. no change from old Confirmed by Johnney Killian, MD, Jeannie Done [phone]) on 10/23/2015 9:33:19 PM       Radiology No results found.  Procedures Procedures (including critical care time)  Medications Ordered in ED Medications  0.9 %  sodium chloride infusion (  Intravenous New Bag/Given 10/23/15 2310)     Initial Impression / Assessment and Plan / ED Course  I have reviewed the triage vital  signs and the nursing notes.  Pertinent labs & imaging results that were available during my care of the patient were reviewed by me and considered in my medical decision making (see chart for details).  Clinical Course   [facility] hospitalist consult for admission.  Final Clinical Impressions(s) / ED Diagnoses   Final diagnoses:  Disorientation  Hypoglycemia  Hypothermia, initial encounter   Patient is in independent living. He was discovered to have mental status change which this appears likely due to hypoglycemia. Patient was however somewhat slow to recover after administration of oral orange juice and D50. Patient was significantly hypothermic. He has been placed under bear hugger and glucose replaced. New Prescriptions New Prescriptions   No medications on file     Charlesetta Shanks, MD 10/24/15 0022

## 2015-10-23 NOTE — ED Notes (Signed)
Warm blankets and Bair hugger placed on pt after speaking with RN about pt temperature.

## 2015-10-23 NOTE — ED Triage Notes (Addendum)
Per [facility] pt coming from [facility]  C/o for hypoglycemia   LSN 8 am. Family came to visit and pt would not come to the door. colds to touch and diaphoretic. CBG 55 -> 102 after Orange juice.. And then CBG 124 after D50. LOC was not improving with the sugar. LOC x3 now. Upon arrival A&O x1. Stroke screen negative per EMS.

## 2015-10-23 NOTE — ED Notes (Signed)
CBG 198. Pt armband not scanning.

## 2015-10-24 ENCOUNTER — Observation Stay (HOSPITAL_COMMUNITY): Payer: Medicare Other

## 2015-10-24 DIAGNOSIS — G9341 Metabolic encephalopathy: Secondary | ICD-10-CM | POA: Diagnosis not present

## 2015-10-24 DIAGNOSIS — Z794 Long term (current) use of insulin: Secondary | ICD-10-CM

## 2015-10-24 DIAGNOSIS — E162 Hypoglycemia, unspecified: Secondary | ICD-10-CM

## 2015-10-24 DIAGNOSIS — N183 Chronic kidney disease, stage 3 (moderate): Secondary | ICD-10-CM | POA: Diagnosis not present

## 2015-10-24 DIAGNOSIS — I10 Essential (primary) hypertension: Secondary | ICD-10-CM | POA: Diagnosis not present

## 2015-10-24 DIAGNOSIS — E119 Type 2 diabetes mellitus without complications: Secondary | ICD-10-CM | POA: Diagnosis not present

## 2015-10-24 DIAGNOSIS — T68XXXD Hypothermia, subsequent encounter: Secondary | ICD-10-CM

## 2015-10-24 DIAGNOSIS — R4182 Altered mental status, unspecified: Secondary | ICD-10-CM | POA: Diagnosis not present

## 2015-10-24 DIAGNOSIS — E038 Other specified hypothyroidism: Secondary | ICD-10-CM

## 2015-10-24 DIAGNOSIS — T68XXXA Hypothermia, initial encounter: Secondary | ICD-10-CM | POA: Diagnosis present

## 2015-10-24 DIAGNOSIS — G9340 Encephalopathy, unspecified: Secondary | ICD-10-CM | POA: Diagnosis present

## 2015-10-24 LAB — COMPREHENSIVE METABOLIC PANEL
ALT: 7 U/L — AB (ref 17–63)
AST: 14 U/L — AB (ref 15–41)
Albumin: 3.7 g/dL (ref 3.5–5.0)
Alkaline Phosphatase: 60 U/L (ref 38–126)
Anion gap: 7 (ref 5–15)
BILIRUBIN TOTAL: 0.6 mg/dL (ref 0.3–1.2)
BUN: 19 mg/dL (ref 6–20)
CALCIUM: 8.9 mg/dL (ref 8.9–10.3)
CO2: 29 mmol/L (ref 22–32)
CREATININE: 1.17 mg/dL (ref 0.61–1.24)
Chloride: 103 mmol/L (ref 101–111)
GFR calc Af Amer: >60 mL/min (ref >60)
GFR, EST NON AFRICAN AMERICAN: 57 mL/min — AB (ref >60)
Glucose, Bld: 159 mg/dL — ABNORMAL HIGH (ref 65–99)
POTASSIUM: 4 mmol/L (ref 3.5–5.1)
Sodium: 139 mmol/L (ref 135–145)
TOTAL PROTEIN: 5.8 g/dL — AB (ref 6.5–8.1)

## 2015-10-24 LAB — GLUCOSE, CAPILLARY
GLUCOSE-CAPILLARY: 159 mg/dL — AB (ref 65–99)
GLUCOSE-CAPILLARY: 140 mg/dL — AB (ref 65–99)
GLUCOSE-CAPILLARY: 80 mg/dL (ref 65–99)
GLUCOSE-CAPILLARY: 134 mg/dL — AB (ref 65–99)
Glucose-Capillary: 198 mg/dL — ABNORMAL HIGH (ref 65–99)
Glucose-Capillary: 237 mg/dL — ABNORMAL HIGH (ref 65–99)
Glucose-Capillary: 178 mg/dL — ABNORMAL HIGH (ref 65–99)

## 2015-10-24 LAB — CBC WITH DIFFERENTIAL/PLATELET
BASOS ABS: 0 10*3/uL (ref 0.0–0.1)
Basophils Relative: 0 %
Eosinophils Absolute: 0.1 10*3/uL (ref 0.0–0.7)
Eosinophils Relative: 2 %
HEMATOCRIT: 41.6 % (ref 39.0–52.0)
Hemoglobin: 13.8 g/dL (ref 13.0–17.0)
LYMPHS PCT: 19 %
Lymphs Abs: 1.4 10*3/uL (ref 0.7–4.0)
MCH: 30.5 pg (ref 26.0–34.0)
MCHC: 33.2 g/dL (ref 30.0–36.0)
MCV: 91.8 fL (ref 78.0–100.0)
MONO ABS: 0.8 10*3/uL (ref 0.1–1.0)
Monocytes Relative: 11 %
NEUTROS ABS: 4.9 10*3/uL (ref 1.7–7.7)
Neutrophils Relative %: 68 %
Platelets: 223 10*3/uL (ref 150–400)
RBC: 4.53 MIL/uL (ref 4.22–5.81)
RDW: 12.6 % (ref 11.5–15.5)
WBC: 7.2 10*3/uL (ref 4.0–10.5)

## 2015-10-24 LAB — TSH: TSH: 3.027 u[IU]/mL (ref 0.350–4.500)

## 2015-10-24 LAB — AMMONIA: AMMONIA: 15 umol/L (ref 9–35)

## 2015-10-24 MED ORDER — TIMOLOL MALEATE 0.5 % OP SOLN
1.0000 [drp] | Freq: Two times a day (BID) | OPHTHALMIC | Status: DC
  Administered 2015-10-24 – 2015-10-25 (×4): 1 [drp] via OPHTHALMIC
  Filled 2015-10-24: qty 5

## 2015-10-24 MED ORDER — POLYETHYLENE GLYCOL 3350 17 G PO PACK
17.0000 g | PACK | Freq: Every day | ORAL | Status: DC
  Administered 2015-10-24 – 2015-10-25 (×2): 17 g via ORAL
  Filled 2015-10-24 (×2): qty 1

## 2015-10-24 MED ORDER — VITAMIN D 1000 UNITS PO TABS
5000.0000 [IU] | ORAL_TABLET | Freq: Every day | ORAL | Status: DC
  Administered 2015-10-24 – 2015-10-25 (×2): 5000 [IU] via ORAL
  Filled 2015-10-24 (×2): qty 5

## 2015-10-24 MED ORDER — ACETAMINOPHEN 650 MG RE SUPP: 650.0000 mg | Freq: Four times a day (QID) | RECTAL | Status: DC | PRN

## 2015-10-24 MED ORDER — LEVOTHYROXINE SODIUM 100 MCG PO TABS: 200.0000 ug | ORAL_TABLET | Freq: Every day | ORAL | Status: DC

## 2015-10-24 MED ORDER — ALBUTEROL SULFATE (2.5 MG/3ML) 0.083% IN NEBU: 2.5000 mg | INHALATION_SOLUTION | RESPIRATORY_TRACT | Status: DC | PRN

## 2015-10-24 MED ORDER — COLESEVELAM HCL 625 MG PO TABS
1250.0000 mg | ORAL_TABLET | Freq: Three times a day (TID) | ORAL | Status: DC
  Administered 2015-10-24 – 2015-10-25 (×5): 1250 mg via ORAL
  Filled 2015-10-24 (×5): qty 2

## 2015-10-24 MED ORDER — INSULIN ASPART 100 UNIT/ML ~~LOC~~ SOLN: 0.0000 [IU] | Freq: Every day | SUBCUTANEOUS | Status: DC

## 2015-10-24 MED ORDER — ENOXAPARIN SODIUM 40 MG/0.4ML ~~LOC~~ SOLN
40.0000 mg | Freq: Every day | SUBCUTANEOUS | Status: DC
  Administered 2015-10-24 – 2015-10-25 (×2): 40 mg via SUBCUTANEOUS
  Filled 2015-10-24 (×2): qty 0.4

## 2015-10-24 MED ORDER — ASPIRIN EC 81 MG PO TBEC
81.0000 mg | DELAYED_RELEASE_TABLET | Freq: Every day | ORAL | Status: DC
  Administered 2015-10-24 – 2015-10-25 (×2): 81 mg via ORAL
  Filled 2015-10-24 (×2): qty 1

## 2015-10-24 MED ORDER — PROSIGHT PO TABS
1.0000 | ORAL_TABLET | Freq: Every day | ORAL | Status: DC
  Administered 2015-10-24 – 2015-10-25 (×2): 1 via ORAL
  Filled 2015-10-24 (×2): qty 1

## 2015-10-24 MED ORDER — CARBIDOPA-LEVODOPA 25-100 MG PO TABS
1.0000 | ORAL_TABLET | Freq: Three times a day (TID) | ORAL | Status: DC
  Administered 2015-10-24 – 2015-10-25 (×5): 1 via ORAL
  Filled 2015-10-24 (×7): qty 1

## 2015-10-24 MED ORDER — INSULIN ASPART 100 UNIT/ML ~~LOC~~ SOLN
0.0000 [IU] | Freq: Three times a day (TID) | SUBCUTANEOUS | Status: DC
Start: 2015-10-24 — End: 2015-10-25
  Administered 2015-10-24: 1 [IU] via SUBCUTANEOUS
  Administered 2015-10-24 – 2015-10-25 (×2): 2 [IU] via SUBCUTANEOUS

## 2015-10-24 MED ORDER — FUROSEMIDE 20 MG PO TABS
20.0000 mg | ORAL_TABLET | Freq: Every morning | ORAL | Status: DC
Start: 2015-10-24 — End: 2015-10-25
  Administered 2015-10-24 – 2015-10-25 (×2): 20 mg via ORAL
  Filled 2015-10-24 (×2): qty 1

## 2015-10-24 MED ORDER — ONDANSETRON HCL 4 MG/2ML IJ SOLN: 4.0000 mg | Freq: Four times a day (QID) | INTRAMUSCULAR | Status: DC | PRN

## 2015-10-24 MED ORDER — ONDANSETRON HCL 4 MG PO TABS: 4.0000 mg | ORAL_TABLET | Freq: Four times a day (QID) | ORAL | Status: DC | PRN

## 2015-10-24 MED ORDER — PANTOPRAZOLE SODIUM 40 MG PO TBEC
40.0000 mg | DELAYED_RELEASE_TABLET | Freq: Every day | ORAL | Status: DC
  Administered 2015-10-24 – 2015-10-25 (×2): 40 mg via ORAL
  Filled 2015-10-24 (×2): qty 1

## 2015-10-24 MED ORDER — SODIUM CHLORIDE 0.9 % IV SOLN
Freq: Once | INTRAVENOUS | Status: AC
  Administered 2015-10-24: 02:00:00 via INTRAVENOUS

## 2015-10-24 MED ORDER — BRIMONIDINE TARTRATE-TIMOLOL 0.2-0.5 % OP SOLN: 1.0000 [drp] | Freq: Two times a day (BID) | OPHTHALMIC | Status: DC

## 2015-10-24 MED ORDER — BENZONATATE 100 MG PO CAPS: 200.0000 mg | ORAL_CAPSULE | Freq: Three times a day (TID) | ORAL | Status: DC | PRN

## 2015-10-24 MED ORDER — LEVOTHYROXINE SODIUM 75 MCG PO TABS: 75.0000 ug | ORAL_TABLET | Freq: Every day | ORAL | Status: DC

## 2015-10-24 MED ORDER — CEPASTAT 14.5 MG MT LOZG
1.0000 | LOZENGE | OROMUCOSAL | Status: DC | PRN
  Filled 2015-10-24: qty 9

## 2015-10-24 MED ORDER — LEVOTHYROXINE SODIUM 75 MCG PO TABS
275.0000 ug | ORAL_TABLET | Freq: Every day | ORAL | Status: DC
  Administered 2015-10-24 – 2015-10-25 (×2): 275 ug via ORAL
  Filled 2015-10-24 (×2): qty 2

## 2015-10-24 MED ORDER — ACETAMINOPHEN 325 MG PO TABS: 650.0000 mg | ORAL_TABLET | Freq: Four times a day (QID) | ORAL | Status: DC | PRN

## 2015-10-24 MED ORDER — BRIMONIDINE TARTRATE 0.2 % OP SOLN
1.0000 [drp] | Freq: Two times a day (BID) | OPHTHALMIC | Status: DC
  Administered 2015-10-24 – 2015-10-25 (×4): 1 [drp] via OPHTHALMIC
  Filled 2015-10-24: qty 5

## 2015-10-24 NOTE — Progress Notes (Signed)
EEG Completed; Results Pending  

## 2015-10-24 NOTE — Procedures (Signed)
History: 82 year old male being evaluated for altered mental status.  Sedation: None  Technique: This is a 21 channel routine scalp EEG performed at the bedside with bipolar and monopolar montages arranged in accordance to the international 10/20 system of electrode placement. One channel was dedicated to EKG recording.    Background: The background consists of intermixed alpha and beta activities. There is a well defined posterior dominant rhythm of 9 Hz that attenuates with eye opening. Sleep is recorded with normal appearing structures.   Photic stimulation: Physiologic driving is not performed  EEG Abnormalities: None  Clinical Interpretation: This normal EEG is recorded in the waking and sleep state. There was no seizure or seizure predisposition recorded on this study. Please note that a normal EEG does not preclude the possibility of epilepsy.   Roland Rack, MD [facility] [phone]  If 7pm- 7am, please page neurology on call as listed in [place].

## 2015-10-24 NOTE — Progress Notes (Signed)
PROGRESS NOTE  [name] [id number] DOB: Jun 07, 1931 DOA: 10/23/2015 PCP: Mathews Argyle, MD  Brief History:   82 y.o. male with medical history significant of DM type II, HTN, HLD, parkinson's dz,  hypothyroidism, prostate cancer s/p radiation only presented with altered mental status. Apparently, the patient's sister came to visit him, but could not get him to come to the door. Eventually, they were able to get into the house and found the patient diaphoretic and cool to the touch. CBG was checked and initial glucose was 55. The patient was given juice with improvement of his blood sugar to 102, but the patient remained lethargic. EMS was activated, and the patient had a glucose of 124 and subsequently given an ampule of D50. In the emergency department, the patient was noted to be hypothermic with temperature 93.45F, but he was hypertensive with blood pressure 179/110. BMP and CBC were unremarkable except for serum sodium 133. The patient was admitted for further workup of his altered mental status.  Assessment/Plan: Acute metabolic encephalopathy  -secondary to hypoglycemia -Obtain EEG--the patient may have been postictal resulting from his hypoglycemic event  -His mental status is improving and appears near baseline on [id number]  -Certainly, the patient's hyponatremia may have contributed  -Ammonia 15  -TSH 3.027 -Urinalysis is negative for pyuria  Hypothermia  -Likely related to the patient's hypoglycemia  -Improved  -Check a.m. cortisol  -Urinalysis negative for pyuria  -TSH 3.027 -Lactic acid 1.81   DM2 -check A1C -novolog sliding scale -hold levemir and monitor as pt has had hypoglycemia at home  Hypothyroidism -Continue levothyroxine -TSH 3.027  CKD stage III -Baseline creatinine 1.0-1.3 -Patient is at baseline  Parkinson's disease - Continue carbidopa-levodopa    Disposition Plan:   [facility] on 10/25/15 if stable Family  Communication:   No Family at bedside  Consultants:  none  Code Status:  FULL  DVT Prophylaxis:  [place] Lovenox   Procedures: As Listed in Progress Note Above  Antibiotics: None    Subjective: Patient denies fevers, chills, headache, chest pain, dyspnea, nausea, vomiting, diarrhea, abdominal pain, dysuria, hematuria, hematochezia, and melena.   Objective: Vitals:   10/24/15 0045 10/24/15 0100 10/24/15 0132 10/24/15 0530  BP: 160/95  (!) 167/93 (!) 141/90  Pulse: 72  72 76  Resp: 17  16 20   Temp:  97.7 F (36.5 C) 98.3 F (36.8 C) 98 F (36.7 C)  TempSrc:  Rectal Rectal Oral  SpO2: 98%  99% 99%  Weight:   68.5 kg (151 lb 0.2 oz)   Height:   5\' 10"  (1.778 m)     Intake/Output Summary (Last 24 hours) at 10/24/15 1113 Last data filed at 10/24/15 1000  Gross per 24 hour  Intake              240 ml  Output             1100 ml  Net             -860 ml   Weight change:  Exam:   General:  Pt is alert, follows commands appropriately, not in acute distress  HEENT: No icterus, No thrush, No neck mass, [place]/AT  Cardiovascular: RRR, S1/S2, no rubs, no gallops  Respiratory: CTA bilaterally, no wheezing, no crackles, no rhonchi  Abdomen: Soft/+BS, non tender, non distended, no guarding  Extremities: 1+ LE edema, No lymphangitis, No petechiae, No rashes, no synovitis   Data Reviewed: I have personally  reviewed following labs and imaging studies Basic Metabolic Panel:  Recent Labs Lab 10/23/15 2119 10/24/15 0159  NA 133* 139  K 4.0 4.0  CL 100* 103  CO2 23 29  GLUCOSE 301* 159*  BUN 21* 19  CREATININE 1.26* 1.17  CALCIUM 8.9 8.9   Liver Function Tests:  Recent Labs Lab 10/23/15 2119 10/24/15 0159  AST 19 14*  ALT 7* 7*  ALKPHOS 57 60  BILITOT 0.5 0.6  PROT 6.1* 5.8*  ALBUMIN 3.5 3.7   No results for input(s): LIPASE, AMYLASE in the last 168 hours.  Recent Labs Lab 10/24/15 0021  AMMONIA 15   Coagulation Profile: No results for input(s): INR,  PROTIME in the last 168 hours. CBC:  Recent Labs Lab 10/23/15 2119 10/24/15 0159  WBC 7.0 7.2  NEUTROABS 5.0 4.9  HGB 13.7 13.8  HCT 40.8 41.6  MCV 92.5 91.8  PLT 206 223   Cardiac Enzymes:  Recent Labs Lab 10/23/15 2119  TROPONINI <0.03   BNP: Invalid input(s): POCBNP CBG:  Recent Labs Lab 10/23/15 2026 10/23/15 2317 10/24/15 0148 10/24/15 0606 10/24/15 1000  GLUCAP 266* 272* 159* 140* 237*   HbA1C: No results for input(s): HGBA1C in the last 72 hours. Urine analysis:    Component Value Date/Time   COLORURINE YELLOW 10/23/2015 2224   APPEARANCEUR CLOUDY (A) 10/23/2015 2224   LABSPEC 1.014 10/23/2015 2224   PHURINE 5.5 10/23/2015 2224   GLUCOSEU 100 (A) 10/23/2015 2224   HGBUR NEGATIVE 10/23/2015 2224   BILIRUBINUR NEGATIVE 10/23/2015 2224   KETONESUR NEGATIVE 10/23/2015 2224   PROTEINUR 30 (A) 10/23/2015 2224   UROBILINOGEN 0.2 05/26/2014 2040   NITRITE NEGATIVE 10/23/2015 2224   LEUKOCYTESUR NEGATIVE 10/23/2015 2224   Sepsis Labs: @LABRCNTIP (procalcitonin:4,lacticidven:4) )No results found for this or any previous visit (from the past 240 hour(s)).   Scheduled Meds: . aspirin EC  81 mg Oral Daily  . brimonidine  1 drop Both Eyes BID   And  . timolol  1 drop Both Eyes BID  . carbidopa-levodopa  1 tablet Oral TID  . cholecalciferol  5,000 Units Oral Daily  . colesevelam  1,250 mg Oral TID WC  . enoxaparin (LOVENOX) injection  40 mg Subcutaneous Daily  . furosemide  20 mg Oral q morning - 10a  . insulin aspart  0-5 Units Subcutaneous QHS  . insulin aspart  0-9 Units Subcutaneous TID WC  . levothyroxine  275 mcg Oral QAC breakfast  . multivitamin  1 tablet Oral Daily  . pantoprazole  40 mg Oral Daily  . polyethylene glycol  17 g Oral Daily   Continuous Infusions:   Procedures/Studies: Ct Head Wo Contrast  Result Date: 10/24/2015 CLINICAL DATA:  Acute onset of altered mental status. Initial encounter. EXAM: CT HEAD WITHOUT CONTRAST  TECHNIQUE: Contiguous axial images were obtained from the base of the skull through the vertex without intravenous contrast. COMPARISON:  CT of the head, and MRI/MRA of the brain performed 10/14/2014 FINDINGS: Brain: No evidence of acute infarction, hemorrhage, hydrocephalus, extra-axial collection or mass lesion/mass effect. Prominence of the ventricles and sulci reflects mild to moderate cortical volume loss. Mild cerebellar atrophy is noted. Scattered periventricular and subcortical white matter change likely reflects small vessel ischemic microangiopathy. Chronic ischemic change is noted at the basal ganglia bilaterally. The brainstem and fourth ventricle are within normal limits. The cerebral hemispheres demonstrate grossly normal gray-white differentiation. No mass effect or midline shift is seen. Vascular: No hyperdense vessel or unexpected calcification. Skull: There is no  evidence of fracture; visualized osseous structures are unremarkable in appearance. Sinuses/Orbits: The orbits are within normal limits. The paranasal sinuses and mastoid air cells are well-aerated. Other: No significant soft tissue abnormalities are seen. IMPRESSION: 1. No acute intracranial pathology seen on CT. 2. Mild to moderate cortical volume loss and scattered small vessel ischemic microangiopathy. 3. Chronic ischemic change at the basal ganglia bilaterally. Electronically Signed   By: Garald Balding M.D.   On: 10/24/2015 00:36    Wellington Winegarden, DO  [facility] Pager [phone]  If 7PM-7AM, please contact night-coverage [email or web address] Password [email or web address] 10/24/2015, 11:13 AM   LOS: 0 days

## 2015-10-24 NOTE — H&P (Signed)
History and Physical    [name] [id number] DOB: 11-20-1931 DOA: 10/23/2015  Referring MD/NP/PA: Dr. Beverly Sessions PCP: Mathews Argyle, MD  Patient coming from: Independent living   Chief Complaint: Altered mental status  HPI: [name] is a 82 y.o. male with medical history significant of DM type II, HTN, HLD, parkinson's dz,  hypothyroidism, prostate cancer s/p radiation only; who presents with complaints of being acutely altered. History is obtained mostly from report along with the assistance of the patient has more awake and alert at this time. Patient's family, visits him and cannot get him to come to the door. They found him call to the touch and diaphoretic with a initial blood glucose of 55. Patient was given oral juice with improvement of blood sugars to 102, but he remained lethargic and called to the touch. Patient notes that his blood sugars have been intermittently been low every 2-3 weeks at home. Patient states that he usually administers all of his medications and has no trouble seeing or administering his insulin. He states that he has made his physician aware of this, but does not note any recent medication changes. Otherwise denies any chest pain, shortness breath, vision changes, focal weakness, changes in vision, or dysuria. EMS noted a blood glucose of 124 and the patient was given an amp of D50 enroute.  ED Course: Upon admission to the emergency room the patient was seen to have a temperature of 93.53F, blood pressure as high as 179/110, and all other vitals relatively within normal limits. Lab work revealed CBC wnl, sodium 133, potassium 4, chloride 100, CO2 23, BUN 21, creatinine 1.26, glucose 301. Patient was placed on a bear hugger with gradual improvement of mental status.  Review of Systems: As per HPI otherwise 10 point review of systems negative.   Past Medical History:  Diagnosis Date  . Allergic rhinitis   . Arthritis    arthritis-kyphosis  .  Asthmatic bronchitis   . Chronic kidney disease   . DM type 2 (diabetes mellitus, type 2) ([place])   . DVT, lower extremity, proximal, acute ([place])   . Dyslipidemia   . Dysrhythmia    '97 tachycardia- no recent issues  . GERD (gastroesophageal reflux disease)   . Hearing impaired    bilateral hearing aids  . History of kidney stones   . History of skin cancer   . Hypertension   . Hypothyroidism   . Macular degeneration    injections done bilaterally recent - 08-22-13  . Prostate cancer [facility])    Prostate cancer-radiation only,skin cancer-basal cell and last squamous cell right eye  . Stroke ([place])   . Urgency-frequency syndrome   . Urticaria     Past Surgical History:  Procedure Laterality Date  . BACK SURGERY     lumbar fracture  . cataract surgery Bilateral   . COLONOSCOPY W/ POLYPECTOMY     x2   . CYSTOSCOPY WITH INJECTION N/A 09/11/2013   Procedure: CYSTOSCOPY WITH BOTOX INJECTION INTO THE BLADDER;  Surgeon: Ailene Rud, MD;  Location: [facility];  Service: Urology;  Laterality: N/A;  . CYSTOSCOPY WITH RETROGRADE PYELOGRAM, URETEROSCOPY AND STENT PLACEMENT    . EYE SURGERY     macular degeneration  . GALLBLADDER SURGERY  2006   no cholecystectomy  . HERNIA REPAIR    . LUNG REMOVAL, PARTIAL  age 82   for bronchiectasis  . PARTIAL THYMECTOMY  1985   for nodules  . POPLITEAL SYNOVIAL CYST EXCISION  2005  .  TOTAL KNEE ARTHROPLASTY  2008   right  . TOTAL KNEE ARTHROPLASTY  2010   left     reports that he has never smoked. He has never used smokeless tobacco. He reports that he does not drink alcohol or use drugs.  Allergies  Allergen Reactions  . Colchicine Anaphylaxis  . Hibiclens [Chlorhexidine] Hives    Cannot use must use ivory soap  . Atorvastatin Other (See Comments)    Joint soreness  . Ceftin Diarrhea  . Celebrex [Celecoxib] Swelling    edema  . Codeine Hives  . Contrast Media [Iodinated Diagnostic Agents] Nausea And Vomiting and Rash  . Erythromycin  Diarrhea  . Ezetimibe Other (See Comments)    Joint soreness  . Oxycodone-Acetaminophen Hives  . Rosuvastatin Other (See Comments)    Joint soreness  . Simvastatin Other (See Comments)    Joint soreness  . Cefpodoxime Rash  . Cefuroxime Axetil Rash  . Nitroglycerin Other (See Comments)    lowers blood pressure   . Tetanus Toxoid Rash  . Vantin Other (See Comments)    Doesn't remember    Family History  Problem Relation Age of Onset  . Other Sister     ophth disease  . Heart attack Father     x7  . Heart disease Father     MI x7  . Hypertension Mother   . Stroke Mother 2    Prior to Admission medications   Medication Sig Start Date End Date Taking? Authorizing Provider  aspirin EC 81 MG tablet Take 81 mg by mouth.   Yes Historical Provider, MD  benzonatate (TESSALON) 200 MG capsule Take 200 mg by mouth 3 (three) times daily as needed for cough.   Yes Historical Provider, MD  carbidopa-levodopa (SINEMET IR) 25-100 MG tablet Take 1 tablet by mouth 3 (three) times daily. Takes [id number]   Yes Historical Provider, MD  Cholecalciferol 5000 units TABS Take 5,000 Units by mouth daily.   Yes Historical Provider, MD  colesevelam (WELCHOL) 625 MG tablet Take 1,250 mg by mouth 3 (three) times daily with meals.    Yes Historical Provider, MD  COMBIGAN 0.2-0.5 % ophthalmic solution Place 1 drop into both eyes every 12 (twelve) hours.  05/25/14  Yes Historical Provider, MD  diltiazem (CARDIZEM CD) 240 MG 24 hr capsule Take 240 mg by mouth every morning.    Yes Historical Provider, MD  furosemide (LASIX) 20 MG tablet Take 20 mg by mouth every morning.    Yes Historical Provider, MD  insulin glargine (LANTUS) 100 UNIT/ML injection Inject 15-20 Units into the skin at bedtime. May increase to 35 units as needed   Yes Historical Provider, MD  insulin regular (NOVOLIN R,HUMULIN R) 100 units/mL injection Inject 5-7 Units into the skin 3 (three) times daily as needed for high blood sugar. 5-7  units according to sliding scale   Yes Historical Provider, MD  levothyroxine (SYNTHROID, LEVOTHROID) 200 MCG tablet Take 200 mcg by mouth daily before breakfast. Total of 239mcg   Yes Historical Provider, MD  levothyroxine (SYNTHROID, LEVOTHROID) 75 MCG tablet Take 75 mcg by mouth daily before breakfast.   Yes Historical Provider, MD  multivitamin-lutein (OCUVITE-LUTEIN) CAPS capsule Take 1 capsule by mouth daily.   Yes Historical Provider, MD  omeprazole (PRILOSEC) 20 MG capsule Take 20 mg by mouth daily.   Yes Historical Provider, MD  polyethylene glycol (MIRALAX / GLYCOLAX) packet Take 17 g by mouth daily.   Yes Historical Provider, MD  albuterol (PROVENTIL HFA;VENTOLIN  HFA) 108 (90 Base) MCG/ACT inhaler Inhale 1-2 puffs into the lungs every 6 (six) hours as needed for wheezing or shortness of breath. Patient not taking: Reported on 10/23/2015 08/05/15   Deneise Lever, MD  Azelastine-Fluticasone [facility]) 137-50 MCG/ACT SUSP Place 1-2 puffs into the nose at bedtime. Patient not taking: Reported on 10/23/2015 08/05/15 08/04/16  Deneise Lever, MD  fluticasone [facility]) 50 MCG/ACT nasal spray Place 2 sprays into both nostrils daily. Patient not taking: Reported on 10/23/2015 04/21/15   Drenda Freeze, MD  oxymetazoline [facility] NASAL SPRAY) 0.05 % nasal spray Place 1 spray into both nostrils 2 (two) times daily. Patient not taking: Reported on 10/23/2015 04/21/15   Drenda Freeze, MD  zolpidem (AMBIEN) 5 MG tablet Take 1 tablet (5 mg total) by mouth at bedtime as needed for sleep. Patient not taking: Reported on 10/23/2015 08/23/15   Deneise Lever, MD    Physical Exam:    Constitutional: Elderly male in NAD, calm, and able to follow commands at this time. Vitals:   10/23/15 2230 10/23/15 2245 10/23/15 2300 10/23/15 2315  BP: 142/79 143/85 142/85   Pulse: (!) 59 (!) 58 63 66  Resp: 20 18 17 21   Temp:      TempSrc:      SpO2: 96% 98% 98% 98%   Eyes: PERRL, lids and conjunctivae  normal ENMT: Mucous membranes are dry. Posterior pharynx clear of any exudate or lesions.Normal dentition.  Neck: normal, supple, no masses, no thyromegaly Respiratory: clear to auscultation bilaterally, no wheezing, no crackles. Normal respiratory effort. No accessory muscle use.  Cardiovascular: Regular rate and rhythm, no murmurs / rubs / gallops. No extremity edema. 2+ pedal pulses. No carotid bruits.  Abdomen: no tenderness, no masses palpated. No hepatosplenomegaly. Bowel sounds positive.  Musculoskeletal: no clubbing / cyanosis. No joint deformity upper and lower extremities. Good ROM, no contractures. Normal muscle tone.  Skin: no rashes, lesions, ulcers. No induration Neurologic: CN 2-12 grossly intact. Sensation intact, DTR normal. Strength 5/5 in all 4.  Psychiatric: Normal judgment and insight. Alert and oriented x 3. Normal mood.     Labs on Admission: I have personally reviewed following labs and imaging studies  CBC:  Recent Labs Lab 10/23/15 2119  WBC 7.0  NEUTROABS 5.0  HGB 13.7  HCT 40.8  MCV 92.5  PLT [id number]   Basic Metabolic Panel:  Recent Labs Lab 10/23/15 2119  NA 133*  K 4.0  CL 100*  CO2 23  GLUCOSE 301*  BUN 21*  CREATININE 1.26*  CALCIUM 8.9   GFR: CrCl cannot be calculated (Unknown ideal weight.). Liver Function Tests:  Recent Labs Lab 10/23/15 2119  AST 19  ALT 7*  ALKPHOS 57  BILITOT 0.5  PROT 6.1*  ALBUMIN 3.5   No results for input(s): LIPASE, AMYLASE in the last 168 hours. No results for input(s): AMMONIA in the last 168 hours. Coagulation Profile: No results for input(s): INR, PROTIME in the last 168 hours. Cardiac Enzymes:  Recent Labs Lab 10/23/15 2119  TROPONINI <0.03   BNP (last 3 results) No results for input(s): PROBNP in the last 8760 hours. HbA1C: No results for input(s): HGBA1C in the last 72 hours. CBG:  Recent Labs Lab 10/23/15 2026 10/23/15 2317  GLUCAP 266* 272*   Lipid Profile: No results for  input(s): CHOL, HDL, LDLCALC, TRIG, CHOLHDL, LDLDIRECT in the last 72 hours. Thyroid Function Tests: No results for input(s): TSH, T4TOTAL, FREET4, T3FREE, THYROIDAB in the last 72 hours.  Anemia Panel: No results for input(s): VITAMINB12, FOLATE, FERRITIN, TIBC, IRON, RETICCTPCT in the last 72 hours. Urine analysis:    Component Value Date/Time   COLORURINE YELLOW 10/23/2015 2224   APPEARANCEUR CLOUDY (A) 10/23/2015 2224   LABSPEC 1.014 10/23/2015 2224   PHURINE 5.5 10/23/2015 2224   GLUCOSEU 100 (A) 10/23/2015 2224   HGBUR NEGATIVE 10/23/2015 2224   BILIRUBINUR NEGATIVE 10/23/2015 [place] 10/23/2015 2224   PROTEINUR 30 (A) 10/23/2015 2224   UROBILINOGEN 0.2 05/26/2014 2040   NITRITE NEGATIVE 10/23/2015 2224   LEUKOCYTESUR NEGATIVE 10/23/2015 2224   Sepsis Labs: No results found for this or any previous visit (from the past 240 hour(s)).   Radiological Exams on Admission: No results found.  EKG: Independently reviewed. Similar to previous EKG  Assessment/Plan Acute encephalopathy secondary to hypoglycemia in a type II, diabetic: Resolving. Patient's initial blood glucose noted to be 55 on admission. The patient curren home regimen includes Lantus 15-20 units subcutaneous daily with 5-7 units Novolin insulin with meals - Admit to a telemetry bed - Continue to monitor mental status - Hypoglycemic protocol - CBGs every before meals and at bedtime with sensitive sliding scale insulin for now - May cosider diabetic education consult in am/ med monitor at patient's independent living facility - May want to notify PCP in am and adjust home insulin regimen  Hypothermia: Acute. Suspect related with patient's hypoglycemia. Patient's initial temperature was noted to be as low as 93.61F. Last check was 95.6. - Continue Bear hugger   Parkinson's disease - Continue carbidopa-levodopa  Essential hypertension  - Continue Lasix   Hypothyroidism - Check TSH -  Continue levothyroxine  Chronic renal insufficiency: Patient creatinine near baseline. - Gentle IV fluids as tolerated    DVT prophylaxis: lovenox   Code Status: Full Family Communication: No family present at bedside Disposition Plan: likely discharged home in a.m. Consults called: none Admission status: Telemetry observation  Norval Morton MD [facility] Pager [phone]  If 7PM-7AM, please contact night-coverage [email or web address] Password [email or web address]  10/24/2015, 12:17 AM

## 2015-10-24 NOTE — Care Management Obs Status (Signed)
[place] NOTIFICATION   Patient Details  Name: [name] MRN: [id number] Date of Birth: 05-27-1931   Medicare Observation Status Notification Given:  Yes    CrutchfieldAntony Haste, RN 10/24/2015, 2:27 PM

## 2015-10-25 ENCOUNTER — Other Ambulatory Visit: Payer: Self-pay | Admitting: Internal Medicine

## 2015-10-25 DIAGNOSIS — G9341 Metabolic encephalopathy: Secondary | ICD-10-CM | POA: Diagnosis not present

## 2015-10-25 DIAGNOSIS — G9340 Encephalopathy, unspecified: Secondary | ICD-10-CM | POA: Diagnosis not present

## 2015-10-25 DIAGNOSIS — N183 Chronic kidney disease, stage 3 (moderate): Secondary | ICD-10-CM | POA: Diagnosis not present

## 2015-10-25 DIAGNOSIS — R279 Unspecified lack of coordination: Secondary | ICD-10-CM | POA: Diagnosis not present

## 2015-10-25 DIAGNOSIS — I10 Essential (primary) hypertension: Secondary | ICD-10-CM | POA: Diagnosis not present

## 2015-10-25 LAB — HEMOGLOBIN A1C
Hgb A1c MFr Bld: 6.4 % — ABNORMAL HIGH (ref 4.8–5.6)
Mean Plasma Glucose: 137 mg/dL

## 2015-10-25 LAB — BASIC METABOLIC PANEL
Anion gap: 7 (ref 5–15)
BUN: 14 mg/dL (ref 6–20)
CHLORIDE: 102 mmol/L (ref 101–111)
CO2: 30 mmol/L (ref 22–32)
Calcium: 9.1 mg/dL (ref 8.9–10.3)
Creatinine, Ser: 1.15 mg/dL (ref 0.61–1.24)
GFR calc Af Amer: >60 mL/min (ref >60)
GFR calc non Af Amer: 58 mL/min — ABNORMAL LOW (ref >60)
GLUCOSE: 126 mg/dL — AB (ref 65–99)
POTASSIUM: 3.8 mmol/L (ref 3.5–5.1)
Sodium: 139 mmol/L (ref 135–145)

## 2015-10-25 LAB — GLUCOSE, CAPILLARY
GLUCOSE-CAPILLARY: 198 mg/dL — AB (ref 65–99)
Glucose-Capillary: 119 mg/dL — ABNORMAL HIGH (ref 65–99)

## 2015-10-25 LAB — CORTISOL-AM, BLOOD: Cortisol - AM: 6.3 ug/dL — ABNORMAL LOW (ref 6.7–22.6)

## 2015-10-25 MED ORDER — SITAGLIPTIN PHOSPHATE 50 MG PO TABS: 50.0000 mg | ORAL_TABLET | Freq: Every day | ORAL | 1 refills | Status: DC

## 2015-10-25 NOTE — Discharge Summary (Signed)
Physician Discharge Summary  [name] [id number] DOB: 01/23/1931 DOA: 10/23/2015  PCP: Mathews Argyle, MD  Admit date: 10/23/2015 Discharge date: 10/25/2015  Admitted From: [name] Disposition:  [facility]  Recommendations for Outpatient Follow-up:  1. Follow up with PCP in 1-2 weeks 2. Please obtain BMP/CBC in one week   Discharge Condition: stable CODE STATUS:*FULL Diet recommendation:  Carb Modified     Brief/Interim Summary:  83 y.o.malewith medical history significant of DM type II, HTN, HLD, parkinson's dz, hypothyroidism, prostate cancer s/p radiation only presented with altered mental status. Apparently, the patient's sister came to visit him, but could not get him to come to the door. Eventually, they were able to get into the house and found the patient diaphoretic and cool to the touch. CBG was checked and initial glucose was 55. The patient was given juice with improvement of his blood sugar to 102, but the patient remained lethargic. EMS was activated, and the patient had a glucose of 124 and subsequently given an ampule of D50. In the emergency department, the patient was noted to be hypothermic with temperature 93.37F, but he was hypertensive with blood pressure 179/110. BMP and CBC were unremarkable except for serum sodium 133. The patient was admitted for further workup of his altered mental status.  Discharge Diagnoses:  Acute metabolic encephalopathy  -secondary to hypoglycemia -Obtain EEG--the patient may have been postictal resulting from his hypoglycemic event  -His mental status is improving and appears near baseline on 10/24/2015  -EEG--neg for any epileptiform predisposition -Certainly, the patient's hyponatremia may have contributed  -Ammonia 15  -TSH 3.027 -Urinalysis is negative for pyuria -resolved;mentation back to baseline at the time of d/c -further questioning reveals that pt has not been taking his insulin properly at  [facility] has been mixing up his Lantus and his humalog at times taking humalog instead of lantus -Furthermore, he cannot tell me the appropriate doses, and he told me that Dr. Felipa Eth told him to stop Lantus a few months ago, but patient restarted it without notifying PCP -I have instructed pt to stop insulin altogether--his CBGs have been well controlled in the hospital and given his age and his A1C of 6.4 (10/24/15), I feel he is safe to discharge with NO insulin -pt instructed to follow up with Dr. Felipa Eth for further management of his DM -Home with [name]  Hypothermia  -Likely related to the patient's hypoglycemia  -Improved  -Check a.m. cortisol --6.3--may need cortrosyn stimulation at some point if this becomes a recurrent problem -he did not have any further episodes of hypothermia -Urinalysis negative for pyuria  -TSH 3.027 -Lactic acid 1.81   DM2--well controlled -check A1C--6.4 -novolog sliding scale -hold levemir and monitor as pt has had hypoglycemia at home -see above discussion  Hypothyroidism -Continue levothyroxine -TSH 3.027  CKD stage III -Baseline creatinine 1.0-1.3 -Patient is at baseline  Parkinsonism--likely PSP - Continue carbidopa-levodopa -follow up with Dr. Wells Guiles Alexina Niccoli   Discharge Instructions  Discharge Instructions    Ambulatory referral to Neurology    Complete by:  As directed    Refer to [facility]   Follow up on PSP   Diet Carb Modified    Complete by:  As directed    Increase activity slowly    Complete by:  As directed        Medication List    STOP taking these medications   albuterol 108 (90 Base) MCG/ACT inhaler Commonly known as:  PROVENTIL HFA;VENTOLIN HFA   Azelastine-Fluticasone 137-50 MCG/ACT  Susp Commonly known as:  DYMISTA   fluticasone 50 MCG/ACT nasal spray Commonly known as:  FLONASE   insulin glargine 100 UNIT/ML injection Commonly known as:  LANTUS   insulin regular 100 units/mL  injection Commonly known as:  NOVOLIN R,HUMULIN R   oxymetazoline 0.05 % nasal spray Commonly known as:  AFRIN NASAL SPRAY   zolpidem 5 MG tablet Commonly known as:  AMBIEN     TAKE these medications   aspirin EC 81 MG tablet Take 81 mg by mouth.   benzonatate 200 MG capsule Commonly known as:  TESSALON Take 200 mg by mouth 3 (three) times daily as needed for cough.   carbidopa-levodopa 25-100 MG tablet Commonly known as:  SINEMET IR Take 1 tablet by mouth 3 (three) times daily. Takes 0800,1200,1600   Cholecalciferol 5000 units Tabs Take 5,000 Units by mouth daily.   colesevelam 625 MG tablet Commonly known as:  WELCHOL Take 1,250 mg by mouth 3 (three) times daily with meals.   COMBIGAN 0.2-0.5 % ophthalmic solution Generic drug:  brimonidine-timolol Place 1 drop into both eyes every 12 (twelve) hours.   diltiazem 240 MG 24 hr capsule Commonly known as:  CARDIZEM CD Take 240 mg by mouth every morning.   furosemide 20 MG tablet Commonly known as:  LASIX Take 20 mg by mouth every morning.   levothyroxine 200 MCG tablet Commonly known as:  SYNTHROID, LEVOTHROID Take 200 mcg by mouth daily before breakfast. Total of 253mcg   levothyroxine 75 MCG tablet Commonly known as:  SYNTHROID, LEVOTHROID Take 75 mcg by mouth daily before breakfast.   multivitamin-lutein Caps capsule Take 1 capsule by mouth daily.   omeprazole 20 MG capsule Commonly known as:  PRILOSEC Take 20 mg by mouth daily.   polyethylene glycol packet Commonly known as:  MIRALAX / GLYCOLAX Take 17 g by mouth daily.   sitaGLIPtin 50 MG tablet Commonly known as:  JANUVIA Take 1 tablet (50 mg total) by mouth daily.      Follow-up [place], MD Follow up in 1 week(s).   Specialty:  Internal Medicine Contact information: [address] [place]  [address] [phone]          Allergies  Allergen Reactions  . Colchicine Anaphylaxis  .  Hibiclens [Chlorhexidine] Hives    Cannot use must use ivory soap  . Atorvastatin Other (See Comments)    Joint soreness  . Ceftin Diarrhea  . Celebrex [Celecoxib] Swelling    edema  . Codeine Hives  . Contrast Media [Iodinated Diagnostic Agents] Nausea And Vomiting and Rash  . Erythromycin Diarrhea  . Ezetimibe Other (See Comments)    Joint soreness  . Oxycodone-Acetaminophen Hives  . Rosuvastatin Other (See Comments)    Joint soreness  . Simvastatin Other (See Comments)    Joint soreness  . Cefpodoxime Rash  . Cefuroxime Axetil Rash  . Nitroglycerin Other (See Comments)    lowers blood pressure   . Tetanus Toxoid Rash  . Vantin Other (See Comments)    Doesn't remember    Consultations:  none   Procedures/Studies: Ct Head Wo Contrast  Result Date: 10/24/2015 CLINICAL DATA:  Acute onset of altered mental status. Initial encounter. EXAM: CT HEAD WITHOUT CONTRAST TECHNIQUE: Contiguous axial images were obtained from the base of the skull through the vertex without intravenous contrast. COMPARISON:  CT of the head, and MRI/MRA of the brain performed 10/14/2014 FINDINGS: Brain: No evidence of acute infarction, hemorrhage, hydrocephalus, extra-axial  collection or mass lesion/mass effect. Prominence of the ventricles and sulci reflects mild to moderate cortical volume loss. Mild cerebellar atrophy is noted. Scattered periventricular and subcortical white matter change likely reflects small vessel ischemic microangiopathy. Chronic ischemic change is noted at the basal ganglia bilaterally. The brainstem and fourth ventricle are within normal limits. The cerebral hemispheres demonstrate grossly normal gray-white differentiation. No mass effect or midline shift is seen. Vascular: No hyperdense vessel or unexpected calcification. Skull: There is no evidence of fracture; visualized osseous structures are unremarkable in appearance. Sinuses/Orbits: The orbits are within normal limits. The  paranasal sinuses and mastoid air cells are well-aerated. Other: No significant soft tissue abnormalities are seen. IMPRESSION: 1. No acute intracranial pathology seen on CT. 2. Mild to moderate cortical volume loss and scattered small vessel ischemic microangiopathy. 3. Chronic ischemic change at the basal ganglia bilaterally. Electronically Signed   By: Garald Balding M.D.   On: 10/24/2015 00:36        Discharge Exam: Vitals:   10/25/15 0448 10/25/15 1001  BP: (!) 172/86 (!) 145/86  Pulse: 71 80  Resp: 18 18  Temp: 97.9 F (36.6 C)    Vitals:   10/24/15 1355 10/24/15 2200 10/25/15 0448 10/25/15 1001  BP: 117/68 (!) 166/97 (!) 172/86 (!) 145/86  Pulse: 68 71 71 80  Resp: 18 18 18 18   Temp: 97.5 F (36.4 C) 98.1 F (36.7 C) 97.9 F (36.6 C)   TempSrc: Oral Oral Oral   SpO2: 99% 100% 100% 99%  Weight:   68.8 kg (151 lb 10.8 oz)   Height:        General: Pt is alert, awake, not in acute distress Cardiovascular: RRR, S1/S2 +, no rubs, no gallops Respiratory: CTA bilaterally, no wheezing, no rhonchi Abdominal: Soft, NT, ND, bowel sounds + Extremities: no edema, no cyanosis   The results of significant diagnostics from this hospitalization (including imaging, microbiology, ancillary and laboratory) are listed below for reference.    Significant Diagnostic Studies: Ct Head Wo Contrast  Result Date: 10/24/2015 CLINICAL DATA:  Acute onset of altered mental status. Initial encounter. EXAM: CT HEAD WITHOUT CONTRAST TECHNIQUE: Contiguous axial images were obtained from the base of the skull through the vertex without intravenous contrast. COMPARISON:  CT of the head, and MRI/MRA of the brain performed 10/14/2014 FINDINGS: Brain: No evidence of acute infarction, hemorrhage, hydrocephalus, extra-axial collection or mass lesion/mass effect. Prominence of the ventricles and sulci reflects mild to moderate cortical volume loss. Mild cerebellar atrophy is noted. Scattered periventricular  and subcortical white matter change likely reflects small vessel ischemic microangiopathy. Chronic ischemic change is noted at the basal ganglia bilaterally. The brainstem and fourth ventricle are within normal limits. The cerebral hemispheres demonstrate grossly normal gray-white differentiation. No mass effect or midline shift is seen. Vascular: No hyperdense vessel or unexpected calcification. Skull: There is no evidence of fracture; visualized osseous structures are unremarkable in appearance. Sinuses/Orbits: The orbits are within normal limits. The paranasal sinuses and mastoid air cells are well-aerated. Other: No significant soft tissue abnormalities are seen. IMPRESSION: 1. No acute intracranial pathology seen on CT. 2. Mild to moderate cortical volume loss and scattered small vessel ischemic microangiopathy. 3. Chronic ischemic change at the basal ganglia bilaterally. Electronically Signed   By: Garald Balding M.D.   On: 10/24/2015 00:36     Microbiology: No results found for this or any previous visit (from the past 240 hour(s)).   Labs: Basic Metabolic Panel:  Recent Labs Lab 10/23/15  2119 10/24/15 0159 10/25/15 0411  NA 133* 139 139  K 4.0 4.0 3.8  CL 100* 103 102  CO2 23 29 30   GLUCOSE 301* 159* 126*  BUN 21* 19 14  CREATININE 1.26* 1.17 1.15  CALCIUM 8.9 8.9 9.1   Liver Function Tests:  Recent Labs Lab 10/23/15 2119 10/24/15 0159  AST 19 14*  ALT 7* 7*  ALKPHOS 57 60  BILITOT 0.5 0.6  PROT 6.1* 5.8*  ALBUMIN 3.5 3.7   No results for input(s): LIPASE, AMYLASE in the last 168 hours.  Recent Labs Lab 10/24/15 0021  AMMONIA 15   CBC:  Recent Labs Lab 10/23/15 2119 10/24/15 0159  WBC 7.0 7.2  NEUTROABS 5.0 4.9  HGB 13.7 13.8  HCT 40.8 41.6  MCV 92.5 91.8  PLT 206 223   Cardiac Enzymes:  Recent Labs Lab 10/23/15 2119  TROPONINI <0.03   BNP: Invalid input(s): POCBNP CBG:  Recent Labs Lab 10/24/15 1142 10/24/15 1635 10/24/15 2155  10/25/15 0622 10/25/15 1122  GLUCAP 178* 80 134* 119* 198*    Time coordinating discharge:  Greater than 30 minutes  Signed:  Shanetra Blumenstock, DO [facility] Pager: [phone] 10/25/2015, 12:18 PM

## 2015-10-25 NOTE — Care Management Note (Signed)
Case Management Note  Patient Details  Name: [name] MRN: [id number] Date of Birth: December 08, 1931  Subjective/Objective:   Acute Metabolic Encephalopathy, Hypothermia, DM              Action/Plan: Discharge Planning: AVS reviewed NCM spoke to pt and states he will go back to his [place] apt at [facility]. States his wife is in the [facility] Care Unit at [facility]. States he does have RW, scooter, cane, shower chair, and elevated commode seat. His sister, Joaquim Lai will provide transportation back to [employer].   PCP Lajean Manes MD  Expected Discharge Date: 10/25/2015             Expected Discharge Plan:  Home/Self Care  In-House Referral:  NA  Discharge planning Services  CM Consult  Post Acute Care Choice:  NA Choice offered to:  NA  DME Arranged:  N/A DME Agency:  NA  HH Arranged:  NA HH Agency:  NA  Status of Service:  Completed, signed off  If discussed at [place] of Stay Meetings, dates discussed:    Additional Comments:  Erenest Rasher, RN 10/25/2015, 12:51 PM

## 2015-10-25 NOTE — Progress Notes (Addendum)
Patient in a stable condition, this RN did discharge teachings with patient and family at bedside , they verbalised understanding, patient belongings at bedside, tele dc ccmd notified, iv removed, report called to nurse at [facility] assisted living, patient taken off the unit by a hospital volunteer

## 2015-10-25 NOTE — Progress Notes (Signed)
K.Kirby N.P. Text page for b.p. of 172/80 p-71

## 2015-10-25 NOTE — Evaluation (Addendum)
Physical Therapy Evaluation/ Discharge Patient Details Name: MAXIMINO ALCORN MRN: [id number] DOB: 07-30-1931 Today's Date: 10/25/2015   History of Present Illness  82 yo admitted after found with AMS at home and hypoglycemic. PMHx: DM, HTN, HLD, Parkinson's, Prostate CA, hypothyroidism  Clinical Impression  Pt very pleasant and very clear in his intention to return to [place]. Pt reports he cares for himself, walks the dog and takes his scooter to the gym several times a week. Pt recently had HHPT after his stay at SNF and reports he has no desire for either again due to his ability to perform exercises on his own. Pt reports no falls in the last year. 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 Pt with shuffling gait with flexed trunk typical of Parkinson's which makes him an increased fall risk but pt compensates well with his deficits. Pt currently at baseline functional status and reports appropriate assist and DME at D/C without further need or desire for therapy. Will sign off per pt request. Recommend daily ambulation in hall acutely.     Follow Up Recommendations No PT follow up    Equipment Recommendations  None recommended by PT , recommend life alert type system for pt to wear    Recommendations for Other Services       Precautions / Restrictions Precautions Precautions: Fall      Mobility  Bed Mobility               General bed mobility comments: pt in chair on arrival  Transfers Overall transfer level: Modified independent               General transfer comment: increased time for anterior translation and rise but able to perform without assist from recliner, BSC and chair  Ambulation/Gait Ambulation/Gait assistance: Supervision Ambulation Distance (Feet): 200 Feet Assistive device: None Gait Pattern/deviations: Shuffle;Trunk flexed;Narrow base of support   Gait velocity  interpretation: Below normal speed for age/gender General Gait Details: pt with parkinsonian gait with flexed trunk, shuffling gait, but no overt LOB  Stairs            Wheelchair Mobility    Modified Rankin (Stroke Patients Only)       Balance Overall balance assessment: Needs assistance   Sitting balance-Leahy Scale: Good       Standing balance-Leahy Scale: Good                               Pertinent Vitals/Pain Pain Assessment: No/denies pain    Home Living Family/patient expects to be discharged to:: Private residence Living Arrangements: Alone   Type of Home: Independent living facility Home Access: Level entry     Home Layout: One level Home Equipment: Bedside commode;Shower seat - built in;Cane - single point;Walker - standard;Electric scooter      Prior Function Level of Independence: Independent         Comments: pt states he walks the dog about 100', does his own cooking and light housework with assist for heavy cleaning every 2 weeks, uses scooter to ride to gym at [employer] to work out 2-3x/wk, takes the [employer] to the grocery store and rides in the motorized cart     Hand Dominance   Dominant Hand: Right    Extremity/Trunk Assessment   Upper Extremity Assessment: Generalized weakness  Lower Extremity Assessment: Generalized weakness      Cervical / Trunk Assessment: Kyphotic  Communication   Communication: HOH  Cognition Arousal/Alertness: Awake/alert Behavior During Therapy: Flat affect Overall Cognitive Status: No family/caregiver present to determine baseline cognitive functioning       Memory: Decreased short-term memory              General Comments      Exercises     Assessment/Plan    PT Assessment Patent does not need any further PT services  PT Problem List            PT Treatment Interventions      PT Goals (Current goals can be found in the Care Plan section)   Acute Rehab PT Goals PT Goal Formulation: All assessment and education complete, DC therapy    Frequency     Barriers to discharge        Co-evaluation               End of Session   Activity Tolerance: Patient tolerated treatment well Patient left: in chair;with call bell/phone within reach Nurse Communication: Mobility status    Functional Assessment Tool Used: clinical judgement Functional Limitation: Mobility: Walking and moving around Mobility: Walking and Moving Around Current Status [id number]): At least 1 percent but less than 20 percent impaired, limited or restricted Mobility: Walking and Moving Around Goal Status [phone]): At least 1 percent but less than 20 percent impaired, limited or restricted Mobility: Walking and Moving Around Discharge Status [phone]): At least 1 percent but less than 20 percent impaired, limited or restricted    Time: 1030-1102 PT Time Calculation (min) (ACUTE ONLY): 32 min   Charges:   PT Evaluation $PT Eval Moderate Complexity: 1 Procedure     PT G Codes:   PT G-Codes **NOT FOR INPATIENT CLASS** Functional Assessment Tool Used: clinical judgement Functional Limitation: Mobility: Walking and moving around Mobility: Walking and Moving Around Current Status [id number]): At least 1 percent but less than 20 percent impaired, limited or restricted Mobility: Walking and Moving Around Goal Status [phone]): At least 1 percent but less than 20 percent impaired, limited or restricted Mobility: Walking and Moving Around Discharge Status [phone]): At least 1 percent but less than 20 percent impaired, limited or restricted    Melford Aase 10/25/2015, 11:57 AM  Elwyn Reach, [place]

## 2015-10-25 NOTE — Clinical Social Work Note (Signed)
Clinical Social Work Assessment  Patient Details  Name: [name] MRN: [id number] Date of Birth: [birth year]  Date of referral:  10/25/15               Reason for consult:  Discharge Planning                Permission sought to share information with:  Facility Sport and exercise psychologist, Family Supports Permission granted to share information::     Name::     [name]  Agency::  SNFs  Relationship::  Sister  Contact Information:     Housing/Transportation Living arrangements for the past 2 months:  [place] of Information:  Patient, Other (Comment Required) (Sister) Patient Interpreter Needed:  None Criminal Activity/Legal Involvement Pertinent to Current Situation/Hospitalization:  No - Comment as needed Significant Relationships:  Siblings Lives with:  Self, Facility Resident Do you feel safe going back to the place where you live?  Yes Need for family participation in patient care:  Yes (Comment)  Care giving concerns:  Patient reported he is from [place]. Patient reported he would like to return to [place] and wants to return.    Social Worker assessment / plan:  CSW met with patient.  Patient reported he is from [facility]. Patient reported he would like to return to [place] and wants to return. Patient reported he does not want to go to short term rehab. CSW called [facility]. They reported patient can return to [place]. CSW signing off.   Employment status:  Retired Forensic scientist:  Medicare PT Recommendations:  Not assessed at this time [place] / Referral to community resources:  [place]  Patient/Family's Response to care: The patient appears happy with the care he is receiving in hospital and is appreciative of CSW assistance  Patient/Family's Understanding of and Emotional Response to Diagnosis, Current Treatment, and Prognosis:   The patient has a good understanding of why he  was admitted. He understands the care plan and what he will need post discharge.  Emotional Assessment Appearance:  Appears older than stated age Attitude/Demeanor/Rapport:   (Patient was appropriate.) Affect (typically observed):  Accepting, Appropriate, Calm Orientation:  Oriented to Self, Oriented to Situation, Oriented to Place, Oriented to  Time Alcohol / Substance use:  Not Applicable Psych involvement (Current and /or in the community):  No (Comment)  Discharge Needs  Concerns to be addressed:  No discharge needs identified Readmission within the last 30 days:  No Current discharge risk:  None Barriers to Discharge:  Continued Medical Work up   [employer], LCSW 10/25/2015, 7:52 AM

## 2015-11-01 DIAGNOSIS — Z79899 Other long term (current) drug therapy: Secondary | ICD-10-CM | POA: Diagnosis not present

## 2015-11-01 DIAGNOSIS — E1122 Type 2 diabetes mellitus with diabetic chronic kidney disease: Secondary | ICD-10-CM | POA: Diagnosis not present

## 2015-11-01 DIAGNOSIS — I129 Hypertensive chronic kidney disease with stage 1 through stage 4 chronic kidney disease, or unspecified chronic kidney disease: Secondary | ICD-10-CM | POA: Diagnosis not present

## 2015-11-01 DIAGNOSIS — Z7984 Long term (current) use of oral hypoglycemic drugs: Secondary | ICD-10-CM | POA: Diagnosis not present

## 2015-11-01 DIAGNOSIS — Z794 Long term (current) use of insulin: Secondary | ICD-10-CM | POA: Diagnosis not present

## 2015-11-01 DIAGNOSIS — N183 Chronic kidney disease, stage 3 (moderate): Secondary | ICD-10-CM | POA: Diagnosis not present

## 2015-11-05 NOTE — Progress Notes (Deleted)
[name] was seen today in the movement disorders clinic for neurologic consultation at the request of Dr. Ernestina Patches at [facility] orthopedics for possible Parkinson's disease.  I have reviewed his records and appreciate those.  I have also reviewed the patient's prior neurology records.  The patient sees [name] and last saw him on 12/03/2014 for a TIA that occurred in October, 2016.  The patient reports, however, that his walking troubles predated this event.  In fact, he states that he was told that he had PD about 4 years ago at [facility].  I was able to locate 2012 records from Dr. Leonie Man and he felt that he had atypical parkinsonism and started the patient on levodopa.   The patient reports that he could not tolerate it.    he states that he went to [facility] as a second opinion and was told that he didn't have PD and the way that he "looked in his eyes" was just from an opthalmology problem.  I did find his neurology consult from [facility] from April, 2012 and it appears that while they thought that he may have had some parkinsonism ( the impressions said "PD "  , it also said that it was so mild that they did not recommend treatment).    I also reviewed care everywhere opthalmology notes from [facility].  The patient states that about 3 months ago he started to have trouble dancing.  He is not sure what makes it so that he can not move.  Even exercising has become difficult.    The patient did have an MRI of the brain on 10/14/2014 without gadolinium when he went to the hospital with his TIA.  Had the opportunity to review this.  This was nonacute.  There was a moderate amount of atrophy.  There was evidence of blood breakdown products in the right parietal region fell indicative of old hemorrhage or trauma.  The MRA of the brain was negative for large or medium vessel stenosis.  03/19/15 update:  The patient follows up today, accompanied by his wife who supplements the history.  The patient was  started on levodopa slowly last visit, as he has not tolerated this when tried on it years ago .  He was also referred for physical therapy. He isn't sure that it has helped. Overall, he reports that he is doing about the same.  He denies any hallucinations.  He denies lightheadedness or near syncope.  No falls.  He is having no SE with the medication but it has not helped.  He is only on 1/2 po tid.  He does state that he has had bronchitis for the last few days and is on a z-pak and tesselon perles.  05/07/15 update:  The patient follows up today, accompanied by his wife who supplements the history.  Last visit, I slowly increased his current/levodopa 25/100 from 0.5 tablets 3 times a day to 2 tablets in the morning, one in the afternoon and one in the evening.  His wife called me on March 27 and states that that the patient was blaming weakness and overall not feeling well on the levodopa, but the patient also had bronchitis and had been on multiple antibiotics at the time.  I told them that they could stop levodopa and see if that helped for a few days.  He ended up in the emergency room on 04/21/2015 with complaints of continued cough.  The workup was negative.  11/07/15  update: The patient follows up today.  I have not seen him in about 6 months.  Last visit, the patient wanted to discontinue his levodopa.  I have reviewed records since our last visit.  He was hospitalized from 10/23/2015 to 10/25/2015 with mental status change.  The patient was hypoglycemic.  Upon further questioning, it appears that the patient was not taking his insulin properly.  It does appear that he was being given levodopa while in the hospital and was discharged on carbidopa/levodopa 25/100, one tablet 3 times per day.    ALLERGIES:   Allergies  Allergen Reactions  . Colchicine Anaphylaxis  . Hibiclens [Chlorhexidine] Hives    Cannot use must use ivory soap  . Atorvastatin Other (See Comments)    Joint soreness  .  Ceftin Diarrhea  . Celebrex [Celecoxib] Swelling    edema  . Codeine Hives  . Contrast Media [Iodinated Diagnostic Agents] Nausea And Vomiting and Rash  . Erythromycin Diarrhea  . Ezetimibe Other (See Comments)    Joint soreness  . Oxycodone-Acetaminophen Hives  . Rosuvastatin Other (See Comments)    Joint soreness  . Simvastatin Other (See Comments)    Joint soreness  . Cefpodoxime Rash  . Cefuroxime Axetil Rash  . Nitroglycerin Other (See Comments)    lowers blood pressure   . Tetanus Toxoid Rash  . Vantin Other (See Comments)    Doesn't remember    CURRENT MEDICATIONS:  Outpatient Encounter Prescriptions as of 11/07/2015  Medication Sig  . aspirin EC 81 MG tablet Take 81 mg by mouth.  . benzonatate (TESSALON) 200 MG capsule take 1 capsule by mouth three times a day if needed for cough  . carbidopa-levodopa (SINEMET IR) 25-100 MG tablet Take 1 tablet by mouth 3 (three) times daily. Takes [id number]  . Cholecalciferol 5000 units TABS Take 5,000 Units by mouth daily.  . colesevelam (WELCHOL) 625 MG tablet Take 1,250 mg by mouth 3 (three) times daily with meals.   . COMBIGAN 0.2-0.5 % ophthalmic solution Place 1 drop into both eyes every 12 (twelve) hours.   Marland Kitchen diltiazem (CARDIZEM CD) 240 MG 24 hr capsule Take 240 mg by mouth every morning.   . furosemide (LASIX) 20 MG tablet Take 20 mg by mouth every morning.   Marland Kitchen levothyroxine (SYNTHROID, LEVOTHROID) 200 MCG tablet Take 200 mcg by mouth daily before breakfast. Total of 258mcg  . levothyroxine (SYNTHROID, LEVOTHROID) 75 MCG tablet Take 75 mcg by mouth daily before breakfast.  . multivitamin-lutein (OCUVITE-LUTEIN) CAPS capsule Take 1 capsule by mouth daily.  Marland Kitchen omeprazole (PRILOSEC) 20 MG capsule Take 20 mg by mouth daily.  . polyethylene glycol (MIRALAX / GLYCOLAX) packet Take 17 g by mouth daily.  . sitaGLIPtin (JANUVIA) 50 MG tablet Take 1 tablet (50 mg total) by mouth daily.   No facility-administered encounter  medications on file as of 11/07/2015.     PAST MEDICAL HISTORY:   Past Medical History:  Diagnosis Date  . Allergic rhinitis   . Arthritis    arthritis-kyphosis  . Asthmatic bronchitis   . Chronic kidney disease   . DM type 2 (diabetes mellitus, type 2) ([place])   . DVT, lower extremity, proximal, acute ([place])   . Dyslipidemia   . Dysrhythmia    '97 tachycardia- no recent issues  . GERD (gastroesophageal reflux disease)   . Hearing impaired    bilateral hearing aids  . History of kidney stones   . History of skin cancer   . Hypertension   .  Hypothyroidism   . Macular degeneration    injections done bilaterally recent - 08-22-13  . Prostate cancer [facility])    Prostate cancer-radiation only,skin cancer-basal cell and last squamous cell right eye  . Stroke ([place])   . Urgency-frequency syndrome   . Urticaria     PAST SURGICAL HISTORY:   Past Surgical History:  Procedure Laterality Date  . BACK SURGERY     lumbar fracture  . cataract surgery Bilateral   . COLONOSCOPY W/ POLYPECTOMY     x2   . CYSTOSCOPY WITH INJECTION N/A 09/11/2013   Procedure: CYSTOSCOPY WITH BOTOX INJECTION INTO THE BLADDER;  Surgeon: Ailene Rud, MD;  Location: [facility];  Service: Urology;  Laterality: N/A;  . CYSTOSCOPY WITH RETROGRADE PYELOGRAM, URETEROSCOPY AND STENT PLACEMENT    . EYE SURGERY     macular degeneration  . GALLBLADDER SURGERY  2006   no cholecystectomy  . HERNIA REPAIR    . LUNG REMOVAL, PARTIAL  age 60   for bronchiectasis  . PARTIAL THYMECTOMY  1985   for nodules  . POPLITEAL SYNOVIAL CYST EXCISION  2005  . TOTAL KNEE ARTHROPLASTY  2008   right  . TOTAL KNEE ARTHROPLASTY  2010   left    SOCIAL HISTORY:   Social History   Social History  . Marital status: Married    Spouse name: N/A  . Number of children: 2  . Years of education: N/A   Occupational History  . retired    Social History Main Topics  . Smoking status: Never Smoker  . Smokeless tobacco: Never Used    . Alcohol use No  . Drug use: No  . Sexual activity: Not Currently   Other Topics Concern  . Not on file   Social History Narrative   MUST HAVE MEDS LISTED DO NOT SUBSTITUTE GENERICS PATIENT IS ALLERGIC TO DYES    FAMILY HISTORY:   Family Status  Relation Status  . Sister    Ophth disease  . Father Deceased   MI  . Mother Deceased   stroke    ROS:  A complete 10 system review of systems was obtained and was unremarkable apart from what is mentioned above.  PHYSICAL EXAMINATION:    VITALS:   There were no vitals filed for this visit.  GEN:  The patient appears stated age and is in NAD. HEENT:  Normocephalic, atraumatic.  The mucous membranes are moist. The superficial temporal arteries are without ropiness or tenderness. CV:  RRR Lungs:  CTAB Neck/HEME:  There are no carotid bruits bilaterally.  Neurological examination:  Orientation: The patient is alert and oriented x3.  Cranial nerves: There is good facial symmetry.  there is significant facial hypomimia. There is upgaze paresis, but otherwise the extraocular muscles are intact.   The speech is fluent and clear.  The speech is hypophonic.  He has some difficulty with the guttural sounds.  Soft palate rises symmetrically and there is no tongue deviation. Hearing is intact to conversational tone. Sensation: Sensation is intact to light touch Motor: Strength is 5/5 in the bilateral upper and lower extremities.   Shoulder shrug is equal and symmetric.  There is no pronator drift.   Movement examination: Tone: There is mild increased tone in the right upper extremity (improved).  Tone elsewhere is normal. Abnormal movements: None Coordination:  There is decremation with RAM's, seen mostly with heel taps/toe taps on the right Gait and Station: The patient has minimal difficulty arising out of a  deep-seated chair without the use of the hands. The patient's stride length is markedly decreased with a stooped posture and  decreased arm swing on the right.    ASSESSMENT/PLAN:  1.  Parkinsonism, likely PSP  -I think that the levodopa helped his rigidity but not the walking but may have not reached high enough dose (2/1/1) but pt wants to d/c med as he doesn't think it helps.  Warned him that if arm is more rigid may not be able to get it out fast enough for protection if falls.  He and wife understood.  Offered levodopa challenge test but refused.   2.  History of TIA in October, 2016.  -He will remain on his enteric coated aspirin. 3.  F/u 6 months sooner should new issues arise.

## 2015-11-07 ENCOUNTER — Ambulatory Visit: Payer: Medicare Other | Admitting: Neurology

## 2015-11-12 DIAGNOSIS — H353221 Exudative age-related macular degeneration, left eye, with active choroidal neovascularization: Secondary | ICD-10-CM | POA: Diagnosis not present

## 2015-12-12 DIAGNOSIS — Z7984 Long term (current) use of oral hypoglycemic drugs: Secondary | ICD-10-CM | POA: Diagnosis not present

## 2015-12-12 DIAGNOSIS — E1122 Type 2 diabetes mellitus with diabetic chronic kidney disease: Secondary | ICD-10-CM | POA: Diagnosis not present

## 2015-12-12 DIAGNOSIS — I129 Hypertensive chronic kidney disease with stage 1 through stage 4 chronic kidney disease, or unspecified chronic kidney disease: Secondary | ICD-10-CM | POA: Diagnosis not present

## 2015-12-12 DIAGNOSIS — N183 Chronic kidney disease, stage 3 (moderate): Secondary | ICD-10-CM | POA: Diagnosis not present

## 2016-01-07 DIAGNOSIS — H353211 Exudative age-related macular degeneration, right eye, with active choroidal neovascularization: Secondary | ICD-10-CM | POA: Diagnosis not present

## 2016-01-25 ENCOUNTER — Observation Stay (HOSPITAL_COMMUNITY)
Admission: EM | Admit: 2016-01-25 | Discharge: 2016-01-27 | Disposition: A | Discharge status: Home / Self Care | Payer: Medicare Other | Attending: Urology | Admitting: Urology

## 2016-01-25 ENCOUNTER — Encounter (HOSPITAL_COMMUNITY): Payer: Self-pay | Admitting: Emergency Medicine

## 2016-01-25 ENCOUNTER — Emergency Department (HOSPITAL_COMMUNITY): Payer: Medicare Other

## 2016-01-25 DIAGNOSIS — N183 Chronic kidney disease, stage 3 (moderate): Secondary | ICD-10-CM | POA: Insufficient documentation

## 2016-01-25 DIAGNOSIS — E785 Hyperlipidemia, unspecified: Secondary | ICD-10-CM

## 2016-01-25 DIAGNOSIS — H9190 Unspecified hearing loss, unspecified ear: Secondary | ICD-10-CM | POA: Insufficient documentation

## 2016-01-25 DIAGNOSIS — M1990 Unspecified osteoarthritis, unspecified site: Secondary | ICD-10-CM | POA: Insufficient documentation

## 2016-01-25 DIAGNOSIS — E890 Postprocedural hypothyroidism: Secondary | ICD-10-CM | POA: Diagnosis not present

## 2016-01-25 DIAGNOSIS — I10 Essential (primary) hypertension: Secondary | ICD-10-CM

## 2016-01-25 DIAGNOSIS — Z887 Allergy status to serum and vaccine status: Secondary | ICD-10-CM | POA: Insufficient documentation

## 2016-01-25 DIAGNOSIS — I129 Hypertensive chronic kidney disease with stage 1 through stage 4 chronic kidney disease, or unspecified chronic kidney disease: Secondary | ICD-10-CM | POA: Diagnosis not present

## 2016-01-25 DIAGNOSIS — R079 Chest pain, unspecified: Secondary | ICD-10-CM | POA: Diagnosis not present

## 2016-01-25 DIAGNOSIS — E1121 Type 2 diabetes mellitus with diabetic nephropathy: Secondary | ICD-10-CM | POA: Diagnosis not present

## 2016-01-25 DIAGNOSIS — G4700 Insomnia, unspecified: Secondary | ICD-10-CM | POA: Diagnosis not present

## 2016-01-25 DIAGNOSIS — R109 Unspecified abdominal pain: Secondary | ICD-10-CM

## 2016-01-25 DIAGNOSIS — G20 Parkinson's disease: Secondary | ICD-10-CM | POA: Diagnosis not present

## 2016-01-25 DIAGNOSIS — N132 Hydronephrosis with renal and ureteral calculous obstruction: Secondary | ICD-10-CM | POA: Diagnosis not present

## 2016-01-25 DIAGNOSIS — I471 Supraventricular tachycardia: Secondary | ICD-10-CM | POA: Insufficient documentation

## 2016-01-25 DIAGNOSIS — Z888 Allergy status to other drugs, medicaments and biological substances status: Secondary | ICD-10-CM | POA: Diagnosis not present

## 2016-01-25 DIAGNOSIS — N281 Cyst of kidney, acquired: Secondary | ICD-10-CM | POA: Insufficient documentation

## 2016-01-25 DIAGNOSIS — E1122 Type 2 diabetes mellitus with diabetic chronic kidney disease: Secondary | ICD-10-CM | POA: Diagnosis not present

## 2016-01-25 DIAGNOSIS — K219 Gastro-esophageal reflux disease without esophagitis: Secondary | ICD-10-CM | POA: Diagnosis not present

## 2016-01-25 DIAGNOSIS — Z881 Allergy status to other antibiotic agents status: Secondary | ICD-10-CM | POA: Diagnosis not present

## 2016-01-25 DIAGNOSIS — Z85828 Personal history of other malignant neoplasm of skin: Secondary | ICD-10-CM | POA: Insufficient documentation

## 2016-01-25 DIAGNOSIS — H409 Unspecified glaucoma: Secondary | ICD-10-CM | POA: Diagnosis not present

## 2016-01-25 DIAGNOSIS — Z794 Long term (current) use of insulin: Secondary | ICD-10-CM | POA: Insufficient documentation

## 2016-01-25 DIAGNOSIS — R1011 Right upper quadrant pain: Secondary | ICD-10-CM | POA: Diagnosis not present

## 2016-01-25 DIAGNOSIS — Z91041 Radiographic dye allergy status: Secondary | ICD-10-CM | POA: Insufficient documentation

## 2016-01-25 DIAGNOSIS — K8020 Calculus of gallbladder without cholecystitis without obstruction: Secondary | ICD-10-CM | POA: Diagnosis not present

## 2016-01-25 DIAGNOSIS — J45909 Unspecified asthma, uncomplicated: Secondary | ICD-10-CM | POA: Insufficient documentation

## 2016-01-25 DIAGNOSIS — I700 Atherosclerosis of aorta: Secondary | ICD-10-CM | POA: Diagnosis not present

## 2016-01-25 DIAGNOSIS — E039 Hypothyroidism, unspecified: Secondary | ICD-10-CM

## 2016-01-25 DIAGNOSIS — H3530 Unspecified macular degeneration: Secondary | ICD-10-CM | POA: Insufficient documentation

## 2016-01-25 DIAGNOSIS — Z8546 Personal history of malignant neoplasm of prostate: Secondary | ICD-10-CM | POA: Insufficient documentation

## 2016-01-25 DIAGNOSIS — Z87442 Personal history of urinary calculi: Secondary | ICD-10-CM | POA: Insufficient documentation

## 2016-01-25 DIAGNOSIS — Z923 Personal history of irradiation: Secondary | ICD-10-CM | POA: Insufficient documentation

## 2016-01-25 DIAGNOSIS — R1031 Right lower quadrant pain: Secondary | ICD-10-CM | POA: Diagnosis not present

## 2016-01-25 DIAGNOSIS — Z7982 Long term (current) use of aspirin: Secondary | ICD-10-CM | POA: Insufficient documentation

## 2016-01-25 DIAGNOSIS — N201 Calculus of ureter: Secondary | ICD-10-CM | POA: Diagnosis present

## 2016-01-25 DIAGNOSIS — Z885 Allergy status to narcotic agent status: Secondary | ICD-10-CM | POA: Insufficient documentation

## 2016-01-25 DIAGNOSIS — Z8673 Personal history of transient ischemic attack (TIA), and cerebral infarction without residual deficits: Secondary | ICD-10-CM | POA: Insufficient documentation

## 2016-01-25 LAB — CBC WITH DIFFERENTIAL/PLATELET
BASOS PCT: 1 %
Basophils Absolute: 0 10*3/uL (ref 0.0–0.1)
Eosinophils Absolute: 0.2 10*3/uL (ref 0.0–0.7)
Eosinophils Relative: 3 %
HEMATOCRIT: 39.2 % (ref 39.0–52.0)
HEMOGLOBIN: 13.3 g/dL (ref 13.0–17.0)
LYMPHS ABS: 1.4 10*3/uL (ref 0.7–4.0)
LYMPHS PCT: 17 %
MCH: 30.8 pg (ref 26.0–34.0)
MCHC: 33.9 g/dL (ref 30.0–36.0)
MCV: 90.7 fL (ref 78.0–100.0)
MONO ABS: 0.8 10*3/uL (ref 0.1–1.0)
Monocytes Relative: 10 %
NEUTROS ABS: 5.6 10*3/uL (ref 1.7–7.7)
Neutrophils Relative %: 69 %
Platelets: 192 10*3/uL (ref 150–400)
RBC: 4.32 MIL/uL (ref 4.22–5.81)
RDW: 12.8 % (ref 11.5–15.5)
WBC: 8 10*3/uL (ref 4.0–10.5)

## 2016-01-25 LAB — URINALYSIS, ROUTINE W REFLEX MICROSCOPIC
BILIRUBIN URINE: NEGATIVE
Glucose, UA: NEGATIVE mg/dL
Hgb urine dipstick: NEGATIVE
KETONES UR: NEGATIVE mg/dL
LEUKOCYTES UA: NEGATIVE
NITRITE: NEGATIVE
Protein, ur: NEGATIVE mg/dL
Specific Gravity, Urine: 1.011 (ref 1.005–1.030)
pH: 5 (ref 5.0–8.0)

## 2016-01-25 LAB — COMPREHENSIVE METABOLIC PANEL
ALBUMIN: 4 g/dL (ref 3.5–5.0)
ALT: 7 U/L — ABNORMAL LOW (ref 17–63)
ANION GAP: 8 (ref 5–15)
AST: 14 U/L — AB (ref 15–41)
Alkaline Phosphatase: 61 U/L (ref 38–126)
BUN: 29 mg/dL — AB (ref 6–20)
CHLORIDE: 107 mmol/L (ref 101–111)
CO2: 22 mmol/L (ref 22–32)
Calcium: 8.9 mg/dL (ref 8.9–10.3)
Creatinine, Ser: 1.41 mg/dL — ABNORMAL HIGH (ref 0.61–1.24)
GFR calc Af Amer: 52 mL/min — ABNORMAL LOW (ref >60)
GFR calc non Af Amer: 45 mL/min — ABNORMAL LOW (ref >60)
GLUCOSE: 110 mg/dL — AB (ref 65–99)
POTASSIUM: 3.9 mmol/L (ref 3.5–5.1)
SODIUM: 137 mmol/L (ref 135–145)
Total Bilirubin: 0.6 mg/dL (ref 0.3–1.2)
Total Protein: 6.1 g/dL — ABNORMAL LOW (ref 6.5–8.1)

## 2016-01-25 LAB — LIPASE, BLOOD: Lipase: 43 U/L (ref 11–51)

## 2016-01-25 LAB — GLUCOSE, CAPILLARY: Glucose-Capillary: 105 mg/dL — ABNORMAL HIGH (ref 65–99)

## 2016-01-25 MED ORDER — PANTOPRAZOLE SODIUM 40 MG PO TBEC
40.0000 mg | DELAYED_RELEASE_TABLET | Freq: Every day | ORAL | Status: DC
  Administered 2016-01-25 – 2016-01-27 (×2): 40 mg via ORAL
  Filled 2016-01-25 (×3): qty 1

## 2016-01-25 MED ORDER — MORPHINE SULFATE (PF) 2 MG/ML IV SOLN
2.0000 mg | INTRAVENOUS | Status: DC | PRN
  Administered 2016-01-25: 2 mg via INTRAVENOUS
  Filled 2016-01-25: qty 1

## 2016-01-25 MED ORDER — HYDROMORPHONE HCL 2 MG PO TABS
2.0000 mg | ORAL_TABLET | Freq: Once | ORAL | Status: AC
  Administered 2016-01-25: 2 mg via ORAL
  Filled 2016-01-25: qty 1

## 2016-01-25 MED ORDER — LEVOTHYROXINE SODIUM 100 MCG PO TABS
200.0000 ug | ORAL_TABLET | Freq: Every day | ORAL | Status: DC
  Administered 2016-01-26 – 2016-01-27 (×2): 200 ug via ORAL
  Filled 2016-01-25: qty 1
  Filled 2016-01-25 (×2): qty 2

## 2016-01-25 MED ORDER — BRIMONIDINE TARTRATE 0.2 % OP SOLN
1.0000 [drp] | Freq: Two times a day (BID) | OPHTHALMIC | Status: DC
  Administered 2016-01-25 – 2016-01-27 (×4): 1 [drp] via OPHTHALMIC
  Filled 2016-01-25: qty 5

## 2016-01-25 MED ORDER — FENTANYL CITRATE (PF) 100 MCG/2ML IJ SOLN
50.0000 ug | Freq: Once | INTRAMUSCULAR | Status: AC
  Administered 2016-01-25: 50 ug via INTRAVENOUS
  Filled 2016-01-25: qty 2

## 2016-01-25 MED ORDER — DIPHENHYDRAMINE HCL 50 MG/ML IJ SOLN: 12.5000 mg | Freq: Four times a day (QID) | INTRAMUSCULAR | Status: DC | PRN

## 2016-01-25 MED ORDER — INSULIN ASPART 100 UNIT/ML ~~LOC~~ SOLN: 0.0000 [IU] | Freq: Three times a day (TID) | SUBCUTANEOUS | Status: DC

## 2016-01-25 MED ORDER — BRIMONIDINE TARTRATE-TIMOLOL 0.2-0.5 % OP SOLN
1.0000 [drp] | Freq: Two times a day (BID) | OPHTHALMIC | Status: DC
  Filled 2016-01-25: qty 5

## 2016-01-25 MED ORDER — HYDROCODONE-ACETAMINOPHEN 5-325 MG PO TABS
1.0000 | ORAL_TABLET | ORAL | Status: DC | PRN
  Filled 2016-01-25: qty 1

## 2016-01-25 MED ORDER — ZOLPIDEM TARTRATE 5 MG PO TABS: 5.0000 mg | ORAL_TABLET | Freq: Every evening | ORAL | Status: DC | PRN

## 2016-01-25 MED ORDER — TIMOLOL MALEATE 0.5 % OP SOLN
1.0000 [drp] | Freq: Two times a day (BID) | OPHTHALMIC | Status: DC
  Administered 2016-01-25 – 2016-01-27 (×4): 1 [drp] via OPHTHALMIC
  Filled 2016-01-25: qty 5

## 2016-01-25 MED ORDER — ASPIRIN EC 81 MG PO TBEC
81.0000 mg | DELAYED_RELEASE_TABLET | Freq: Every day | ORAL | Status: DC
  Administered 2016-01-25 – 2016-01-27 (×3): 81 mg via ORAL
  Filled 2016-01-25 (×3): qty 1

## 2016-01-25 MED ORDER — COLESEVELAM HCL 625 MG PO TABS
1250.0000 mg | ORAL_TABLET | Freq: Three times a day (TID) | ORAL | Status: DC
  Administered 2016-01-25 – 2016-01-27 (×6): 1250 mg via ORAL
  Filled 2016-01-25 (×7): qty 2

## 2016-01-25 MED ORDER — HYDRALAZINE HCL 20 MG/ML IJ SOLN
10.0000 mg | Freq: Four times a day (QID) | INTRAMUSCULAR | Status: DC | PRN
Start: 2016-01-25 — End: 2016-01-27
  Administered 2016-01-25: 10 mg via INTRAVENOUS
  Filled 2016-01-25: qty 1

## 2016-01-25 MED ORDER — GENTAMICIN SULFATE 40 MG/ML IJ SOLN
5.0000 mg/kg | INTRAVENOUS | Status: AC
  Administered 2016-01-26: 116 mg via INTRAVENOUS
  Filled 2016-01-25: qty 8

## 2016-01-25 MED ORDER — DIPHENHYDRAMINE HCL 12.5 MG/5ML PO ELIX: 12.5000 mg | ORAL_SOLUTION | Freq: Four times a day (QID) | ORAL | Status: DC | PRN

## 2016-01-25 MED ORDER — ACETAMINOPHEN 325 MG PO TABS: 650.0000 mg | ORAL_TABLET | ORAL | Status: DC | PRN

## 2016-01-25 MED ORDER — LEVOTHYROXINE SODIUM 75 MCG PO TABS
75.0000 ug | ORAL_TABLET | Freq: Every day | ORAL | Status: DC
  Administered 2016-01-26 – 2016-01-27 (×2): 75 ug via ORAL
  Filled 2016-01-25 (×2): qty 1

## 2016-01-25 MED ORDER — CARBIDOPA-LEVODOPA 25-100 MG PO TABS
1.0000 | ORAL_TABLET | ORAL | Status: DC
  Administered 2016-01-25 – 2016-01-27 (×6): 1 via ORAL
  Filled 2016-01-25 (×6): qty 1

## 2016-01-25 MED ORDER — INSULIN ASPART 100 UNIT/ML ~~LOC~~ SOLN: 0.0000 [IU] | Freq: Every day | SUBCUTANEOUS | Status: DC

## 2016-01-25 MED ORDER — SODIUM CHLORIDE 0.9 % IV BOLUS (SEPSIS)
1000.0000 mL | Freq: Once | INTRAVENOUS | Status: AC
Start: 2016-01-25 — End: 2016-01-25
  Administered 2016-01-25: 1000 mL via INTRAVENOUS

## 2016-01-25 MED ORDER — INSULIN ASPART 100 UNIT/ML ~~LOC~~ SOLN
0.0000 [IU] | Freq: Three times a day (TID) | SUBCUTANEOUS | Status: DC
  Administered 2016-01-26 – 2016-01-27 (×2): 3 [IU] via SUBCUTANEOUS

## 2016-01-25 MED ORDER — FUROSEMIDE 20 MG PO TABS
20.0000 mg | ORAL_TABLET | Freq: Every day | ORAL | Status: DC
  Administered 2016-01-26 – 2016-01-27 (×2): 20 mg via ORAL
  Filled 2016-01-25 (×2): qty 1

## 2016-01-25 MED ORDER — ONDANSETRON HCL 4 MG/2ML IJ SOLN: 4.0000 mg | INTRAMUSCULAR | Status: DC | PRN

## 2016-01-25 MED ORDER — DILTIAZEM HCL ER COATED BEADS 240 MG PO CP24
240.0000 mg | ORAL_CAPSULE | Freq: Every day | ORAL | Status: DC
  Administered 2016-01-25 – 2016-01-27 (×2): 240 mg via ORAL
  Filled 2016-01-25 (×3): qty 1

## 2016-01-25 NOTE — ED Notes (Signed)
Per ultrasound tech, Ritu it will be about an hour and a half before she can do patient's ultrasound. RN Maudie Mercury notified.

## 2016-01-25 NOTE — Consult Note (Addendum)
Medical Consultation   [name]  [id number]  DOB: 10-Jan-1932  DOA: 01/25/2016  PCP: Mathews Argyle, MD   Requesting physician: Dr. Noah Delaine   Reason for consultation: Assistance managing medical problems  History of Present Illness: [name] is an 83 y.o. male parkinson's disease, HTN, HLD, SVT, GERD, CKD stage 3, type 2 diabetes with nephropathy, hypothyroidism and hx of prostate cancer; who present to ED secondary to abd pain. Pain is constant and localized into his right flank area; slightly radiated to RLQ; nothing makes it better or worse; no associated symptoms; 8-9/10 in intensity. Patient denies nausea, fever, chills, cough, CP, SOB, dysuria, hematuria, melena, hematochezia or any other complaints.  Work up in ED demonstrated positive right ureteral stones (X2) with obstructive uropathy and right hydronephrosis. Urology consulted and looking to performed surgical intervention on 01/25/16; given multiple chronic medical problems, TRH was asked to see along in consultation to help managing his cormobidities.    Review of Systems:  ROS As per HPI otherwise 10 point review of systems negative.   Past Medical History: Past Medical History:  Diagnosis Date  . Allergic rhinitis   . Arthritis    arthritis-kyphosis  . Asthmatic bronchitis   . Chronic kidney disease   . DM type 2 (diabetes mellitus, type 2) ([place])   . DVT, lower extremity, proximal, acute ([place])   . Dyslipidemia   . Dysrhythmia    '97 tachycardia- no recent issues  . GERD (gastroesophageal reflux disease)   . Hearing impaired    bilateral hearing aids  . History of kidney stones   . History of skin cancer   . Hypertension   . Hypothyroidism   . Macular degeneration    injections done bilaterally recent - 08-22-13  . Prostate cancer [facility])    Prostate cancer-radiation only,skin cancer-basal cell and last squamous cell right eye  . Stroke ([place])   . Urgency-frequency  syndrome   . Urticaria     Past Surgical History: Past Surgical History:  Procedure Laterality Date  . BACK SURGERY     lumbar fracture  . cataract surgery Bilateral   . COLONOSCOPY W/ POLYPECTOMY     x2   . CYSTOSCOPY WITH INJECTION N/A 09/11/2013   Procedure: CYSTOSCOPY WITH BOTOX INJECTION INTO THE BLADDER;  Surgeon: Ailene Rud, MD;  Location: [facility];  Service: Urology;  Laterality: N/A;  . CYSTOSCOPY WITH RETROGRADE PYELOGRAM, URETEROSCOPY AND STENT PLACEMENT    . EYE SURGERY     macular degeneration  . GALLBLADDER SURGERY  2006   no cholecystectomy  . HERNIA REPAIR    . LUNG REMOVAL, PARTIAL  age 61   for bronchiectasis  . PARTIAL THYMECTOMY  1985   for nodules  . POPLITEAL SYNOVIAL CYST EXCISION  2005  . TOTAL KNEE ARTHROPLASTY  2008   right  . TOTAL KNEE ARTHROPLASTY  2010   left   Allergies:   Allergies  Allergen Reactions  . Colchicine Anaphylaxis  . Hibiclens [Chlorhexidine] Hives    Cannot use must use ivory soap  . Atorvastatin Other (See Comments)    Joint soreness  . Ceftin Diarrhea  . Celebrex [Celecoxib] Swelling    edema  . Codeine Hives  . Contrast Media [Iodinated Diagnostic Agents] Nausea And Vomiting and Rash  . Erythromycin Diarrhea  . Ezetimibe Other (See Comments)    Joint soreness  . Oxycodone-Acetaminophen Hives  . Rosuvastatin  Other (See Comments)    Joint soreness  . Simvastatin Other (See Comments)    Joint soreness  . Cefpodoxime Rash  . Cefuroxime Axetil Rash  . Nitroglycerin Other (See Comments)    lowers blood pressure   . Tetanus Toxoid Rash  . Vantin Other (See Comments)    Doesn't remember   Social History:  reports that he has never smoked. He has never used smokeless tobacco. He reports that he does not drink alcohol or use drugs.   Family History: Family History  Problem Relation Age of Onset  . Other Sister     ophth disease  . Heart attack Father     x7  . Heart disease Father     MI x7  .  Hypertension Mother   . Stroke Mother 76   Physical Exam: Vitals:   01/25/16 1130 01/25/16 1230 01/25/16 1246 01/25/16 1633  BP: 174/97 159/90 159/90 (!) 191/106  Pulse:   60 65  Resp: 13 15 18 18   Temp:      TempSrc:      SpO2:   97% 99%    Constitutional: Alert and awake, oriented x3, reporting pain in his right flank. No nausea, no vomiting and will like to eat something.  Eyes: PERLA, EOMI, irises appear normal, anicteric sclera, no nystgamus  ENMT: external ears and nose appear normal, patient hard of hearing, moist MM, no erythema or exudates in his throat. No thrush Neck: neck appears normal, no masses, normal ROM, no thyromegaly, no JVD  CVS: S1-S2 clear, no murmur rubs or gallops, no LE edema, normal pedal pulses  Respiratory:  clear to auscultation bilaterally, no wheezing, rales or rhonchi. Respiratory effort normal. No accessory muscle use.  Abdomen: soft, with positive right CVA tenderness, nondistended, normal bowel sounds, no masses and no guarding. Musculoskeletal: : no cyanosis, clubbing or edema noted bilaterally. No contractures, normal muscle tone and no joint swelling. Neuro: Cranial nerves II-XII intact, normal strength, sensation and reflexes. Patient following commands appropriately and w/o any focal deficit. Psych: judgement and insight appear normal, stable mood and affect, mental status WNL Skin: no rashes or lesions or ulcers, no induration or nodules appreciated  Data reviewed:  I have personally reviewed following labs and imaging studies Labs:  CBC:  Recent Labs Lab 01/25/16 0910  WBC 8.0  NEUTROABS 5.6  HGB 13.3  HCT 39.2  MCV 90.7  PLT [id number]    Basic Metabolic Panel:  Recent Labs Lab 01/25/16 0910  NA 137  K 3.9  CL 107  CO2 22  GLUCOSE 110*  BUN 29*  CREATININE 1.41*  CALCIUM 8.9   GFR CrCl cannot be calculated (Unknown ideal weight.).   Liver Function Tests:  Recent Labs Lab 01/25/16 0910  AST 14*  ALT 7*  ALKPHOS 61    BILITOT 0.6  PROT 6.1*  ALBUMIN 4.0    Recent Labs Lab 01/25/16 0910  LIPASE 43   Urinalysis    Component Value Date/Time   COLORURINE STRAW (A) 01/25/2016 0914   APPEARANCEUR CLEAR 01/25/2016 0914   LABSPEC 1.011 01/25/2016 0914   PHURINE 5.0 01/25/2016 0914   GLUCOSEU NEGATIVE 01/25/2016 0914   HGBUR NEGATIVE 01/25/2016 0914   BILIRUBINUR NEGATIVE 01/25/2016 0914   KETONESUR NEGATIVE 01/25/2016 0914   PROTEINUR NEGATIVE 01/25/2016 0914   UROBILINOGEN 0.2 05/26/2014 2040   NITRITE NEGATIVE 01/25/2016 0914   LEUKOCYTESUR NEGATIVE 01/25/2016 0914    Microbiology No results found for this or any previous visit (from the  past 240 hour(s)).   Inpatient Medications:   Scheduled Meds: . aspirin EC  81 mg Oral Daily  . brimonidine-timolol  1 drop Both Eyes Q12H  . carbidopa-levodopa  1 tablet Oral TID  . [START ON 01/26/2016] colesevelam  1,250 mg Oral TID WC  . [START ON 01/26/2016] diltiazem  240 mg Oral BH-q7a  . [START ON 01/26/2016] furosemide  20 mg Oral q morning - 10a  . [START ON 01/26/2016] insulin aspart  0-15 Units Subcutaneous TID WC  . insulin aspart  0-5 Units Subcutaneous QHS  . [START ON 01/26/2016] levothyroxine  200 mcg Oral QAC breakfast  . [START ON 01/26/2016] levothyroxine  75 mcg Oral QAC breakfast  . pantoprazole  40 mg Oral Daily   Continuous Infusions: . [START ON 01/26/2016] gentamicin      Radiological Exams on Admission: Dg Chest 2 View  Result Date: 01/25/2016 CLINICAL DATA:  Right lower chest pain. EXAM: CHEST  2 VIEW COMPARISON:  April 21, 2015 FINDINGS: Prominent skin folds over the lateral right lung base. No pneumothorax. The heart, hila, and mediastinum are normal. No pulmonary nodules or masses. No focal infiltrates. IMPRESSION: No active cardiopulmonary disease. Electronically Signed   By: Dorise Bullion III M.D   On: 01/25/2016 09:47   Ct Renal Stone Study  Result Date: 01/25/2016 CLINICAL DATA:  Right flank pain.  History of stones.  EXAM: CT ABDOMEN AND PELVIS WITHOUT CONTRAST TECHNIQUE: Multidetector CT imaging of the abdomen and pelvis was performed following the standard protocol without IV contrast. COMPARISON:  August 24, 2007 FINDINGS: Lower chest: Small rounded calcifications are seen in the left base, primarily along pleural surfaces. This is not the typical appearance of asbestosis. The finding is indeterminate but of doubtful acute significance. This is thought to be a chronic process. No acute abnormalities are identified within the lung bases. There is a moderate hiatal hernia. Hepatobiliary: Cholelithiasis is identified. A small amount of fluid is seen adjacent to the gallbladder without definitive wall thickening. The liver is unremarkable. Pancreas: Unremarkable. No pancreatic ductal dilatation or surrounding inflammatory changes. Spleen: Normal in size without focal abnormality. Adrenals/Urinary Tract: Multiple bilateral renal cysts are identified. A rounded high attenuation mass in the upper left kidney on series 2, image 23 is likely a hyperdense cyst. There is an 8 mm stone in the left lower pole without hydronephrosis. There is a stone in a lower pole infundibulum on the right on series 2, image 42. There is a another smaller stone on image 40. There is a stone in a mid pole infundibulum measuring 6 mm on image 38. There is moderate right-sided hydronephrosis and perinephric stranding. The proximal right ureter is dilated due to 2 proximal right ureteral stones with the larger more proximal stone measuring 8.3 mm and the smaller more distal stone measuring 5.7 mm. The remainder of the right ureter is normal in appearance. The adrenal glands are normal. Stomach/Bowel: There is a hiatal hernia. The stomach and small bowel are otherwise normal. The colon is normal in appearance. Visualized portions of the appendix are normal as well. Vascular/Lymphatic: Atherosclerotic changes seen in the aorta, iliac vessels, and femoral  vessels without aneurysm. No adenopathy identified. Reproductive: Prostate is unremarkable. Other: There is a small amount of ascites in the pelvis. No free air. Musculoskeletal: Scalloping of the superior endplate of L1 is age indeterminate for probably chronic. IMPRESSION: 1. Two obstructing stones in the proximal right ureter with the more distal stone measuring 5.7 mm and the more  proximal of the 2 stones measuring 8.3 mm. There is another stone in a right mid pole infundibulum at risk of passing, a stone in a right lower pole infundibulum, and a stone in a right lower calyx. 2. Atherosclerosis in the aorta, iliac vessels, and femoral vessels. 3. Cholelithiasis. There is a small amount of fluid adjacent the gallbladder but this could be secondary to the ascites seen elsewhere. An ultrasound could further evaluate the gallbladder if warranted. Electronically Signed   By: Dorise Bullion III M.D   On: 01/25/2016 12:29   US Abdomen Limited Ruq  Result Date: 01/25/2016 CLINICAL DATA:  Right-sided abdominal pain. EXAM: US ABDOMEN LIMITED - RIGHT UPPER QUADRANT COMPARISON:  CT, 08/24/2007 FINDINGS: Gallbladder: Multiple gallstones, largest measuring 1.7 cm. No wall thickening. No pericholecystic fluid. Common bile duct: Diameter: Not well visualized.  No dilation.  Approximate 4 mm. Liver: No focal lesion identified. Within normal limits in parenchymal echogenicity. Right-sided hydronephrosis and proximal right ureteral dilation. 10 cm cyst arises from the upper pole. IMPRESSION: 1. Cholelithiasis with no evidence of acute cholecystitis. 2. Right hydronephrosis. Consider a right ureteral stone as the source of this patient's pain, which could be further assessed with unenhanced abdomen and pelvis CT. Electronically Signed   By: Lajean Manes M.D.   On: 01/25/2016 11:43    Impression/Recommendations 1-abd pain/flank pain: due to Ureteral calculus, with obstruction and hydronephrosis -urology primary and  looking to addressed problem -continue supportive care, analgesics and if needed antiemetics -NPO after midnights for possible interventions. -receiving IVF's  2-HTN and prior hx of SVT: -will continue cardizem and lasix -pain most likely contributing to high BP -will add PRN hydralazine -follow urology rec's for pain control -rate controlled currently and sinus.  3-hypothyroidism -will continue synthroid   4-Parkinson's disease -stable -will continue Sinemet  5-glaucoma -continue Combigan eye drops  6-HLD -will continue colesevelam  7-type 2 diabetes with nephropathy -Cr stable and at baseline -will hold oral hypoglycemic regimen -started on SSI while inpatient  -last A1C in October 2017; demonstrating a 6.4 level  8-GERD -will continue PPI  9-insomnia -will continue PRN Lorrin Mais    Thank you for this consultation.  Our [facility] hospitalist team will follow the patient with you.   Time Spent: 50 minutes  Barton Dubois M.D. [facility] 01/25/2016, 5:43 PM

## 2016-01-25 NOTE — ED Notes (Signed)
Patient transported to X-ray 

## 2016-01-25 NOTE — ED Notes (Signed)
Pt states pain is worsening, 7/10.

## 2016-01-25 NOTE — ED Provider Notes (Signed)
[facility] DEPT Provider Note   CSN: [id number] Arrival date & time: 01/25/16  [id number]     History   Chief Complaint Chief Complaint  Patient presents with  . Abdominal Pain    right mid    HPI IDREES ORLANDINI is a 83 y.o. male.  HPI  83 year old male presents with right-sided abdominal pain that started this morning at 6:30. Points to just under his lateral ribs as the source of pain. It is sharp. Constant and not worsening since onset. No fevers, nausea, vomiting, diarrhea, or constipation. Chronically has urinary frequency at night for years that no recent change in urine. No dysuria. Pain seems to wrap around to his back. Worsens with inspiration. Denies any chest pain or cough. Has not taken anything for pain. Pain is 9/10 at this time.  Past Medical History:  Diagnosis Date  . Allergic rhinitis   . Arthritis    arthritis-kyphosis  . Asthmatic bronchitis   . Chronic kidney disease   . DM type 2 (diabetes mellitus, type 2) ([place])   . DVT, lower extremity, proximal, acute ([place])   . Dyslipidemia   . Dysrhythmia    '97 tachycardia- no recent issues  . GERD (gastroesophageal reflux disease)   . Hearing impaired    bilateral hearing aids  . History of kidney stones   . History of skin cancer   . Hypertension   . Hypothyroidism   . Macular degeneration    injections done bilaterally recent - 08-22-13  . Prostate cancer [facility])    Prostate cancer-radiation only,skin cancer-basal cell and last squamous cell right eye  . Stroke ([place])   . Urgency-frequency syndrome   . Urticaria     Patient Active Problem List   Diagnosis Date Noted  . Mental status change 10/24/2015  . Encephalopathy 10/24/2015  . Hypothermia 10/24/2015  . Chronic renal insufficiency 10/24/2015  . Benign essential HTN 10/24/2015  . Hypoglycemia 10/24/2015  . Acute encephalopathy 10/24/2015  . CVA (cerebral vascular accident) ([place]) 10/14/2014  . Prerenal azotemia 10/14/2014  . Mild HTN 10/14/2014    . CVA (cerebral infarction)   . TIA (transient ischemic attack)   . PNAR (perennial non-allergic rhinitis) 05/23/2013  . Seasonal and perennial allergic rhinitis 06/11/2012  . Acute bronchitis due to infection 01/14/2011  . Insomnia, unspecified 05/19/2010  . ATAXIA 03/17/2010  . DIZZINESS 03/06/2010  . Chronic constipation 02/21/2008  . OSTEOARTHRITIS, KNEE, SEVERE 01/12/2008  . HYPERLIPIDEMIA 01/17/2007  . Hypothyroidism (post surgical) 10/15/2006  . SKIN CANCER, HX OF 06/08/2006  . Diabetes mellitus, insulin dependent (IDDM), controlled ([place]) 04/22/2006  . URTICARIA 04/22/2006  . POPLITEAL CYST 04/22/2006  . THYROIDECTOMY, SUBTOTAL, HX OF 04/22/2006  . TOTAL KNEE REPLACEMENT, HX OF 04/22/2006    Past Surgical History:  Procedure Laterality Date  . BACK SURGERY     lumbar fracture  . cataract surgery Bilateral   . COLONOSCOPY W/ POLYPECTOMY     x2   . CYSTOSCOPY WITH INJECTION N/A 09/11/2013   Procedure: CYSTOSCOPY WITH BOTOX INJECTION INTO THE BLADDER;  Surgeon: Ailene Rud, MD;  Location: [facility] ORS;  Service: Urology;  Laterality: N/A;  . CYSTOSCOPY WITH RETROGRADE PYELOGRAM, URETEROSCOPY AND STENT PLACEMENT    . EYE SURGERY     macular degeneration  . GALLBLADDER SURGERY  2006   no cholecystectomy  . HERNIA REPAIR    . LUNG REMOVAL, PARTIAL  age 18   for bronchiectasis  . PARTIAL THYMECTOMY  1985   for nodules  .  POPLITEAL SYNOVIAL CYST EXCISION  2005  . TOTAL KNEE ARTHROPLASTY  2008   right  . TOTAL KNEE ARTHROPLASTY  2010   left       Home Medications    Prior to Admission medications   Medication Sig Start Date End Date Taking? Authorizing Provider  aspirin EC 81 MG tablet Take 81 mg by mouth.   Yes Historical Provider, MD  carbidopa-levodopa (SINEMET IR) 25-100 MG tablet Take 1 tablet by mouth 3 (three) times daily. Takes [id number]   Yes Historical Provider, MD  colesevelam (WELCHOL) 625 MG tablet Take 1,250 mg by mouth 3 (three) times daily  with meals.    Yes Historical Provider, MD  COMBIGAN 0.2-0.5 % ophthalmic solution Place 1 drop into both eyes every 12 (twelve) hours.  05/25/14  Yes Historical Provider, MD  diltiazem (CARDIZEM CD) 240 MG 24 hr capsule Take 240 mg by mouth every morning.    Yes Historical Provider, MD  furosemide (LASIX) 20 MG tablet Take 20 mg by mouth every morning.    Yes Historical Provider, MD  levothyroxine (SYNTHROID, LEVOTHROID) 200 MCG tablet Take 200 mcg by mouth daily before breakfast. Total of 230mcg   Yes Historical Provider, MD  levothyroxine (SYNTHROID, LEVOTHROID) 75 MCG tablet Take 75 mcg by mouth daily before breakfast.   Yes Historical Provider, MD  multivitamin-lutein (OCUVITE-LUTEIN) CAPS capsule Take 1 capsule by mouth daily.   Yes Historical Provider, MD  omeprazole (PRILOSEC) 20 MG capsule Take 20 mg by mouth daily.   Yes Historical Provider, MD  polyethylene glycol (MIRALAX / GLYCOLAX) packet Take 17 g by mouth daily.   Yes Historical Provider, MD  sitaGLIPtin (JANUVIA) 50 MG tablet Take 1 tablet (50 mg total) by mouth daily. 10/25/15  Yes Orson Eva, MD  zolpidem (AMBIEN) 5 MG tablet Take 5 mg by mouth daily as needed. 01/19/16  Yes Historical Provider, MD  benzonatate (TESSALON) 200 MG capsule take 1 capsule by mouth three times a day if needed for cough Patient not taking: Reported on 01/25/2016 10/25/15   Deneise Lever, MD    Family History Family History  Problem Relation Age of Onset  . Other Sister     ophth disease  . Heart attack Father     x7  . Heart disease Father     MI x7  . Hypertension Mother   . Stroke Mother 17    Social History Social History  Substance Use Topics  . Smoking status: Never Smoker  . Smokeless tobacco: Never Used  . Alcohol use No     Allergies   Colchicine; Hibiclens [chlorhexidine]; Atorvastatin; Ceftin; Celebrex [celecoxib]; Codeine; Contrast media [iodinated diagnostic agents]; Erythromycin; Ezetimibe; Oxycodone-acetaminophen;  Rosuvastatin; Simvastatin; Cefpodoxime; Cefuroxime axetil; Nitroglycerin; Tetanus toxoid; and Vantin   Review of Systems Review of Systems  Constitutional: Negative for fever.  Respiratory: Negative for shortness of breath.   Cardiovascular: Negative for chest pain.  Gastrointestinal: Positive for abdominal pain. Negative for constipation, diarrhea, nausea and vomiting.  Genitourinary: Negative for dysuria.  Musculoskeletal: Positive for back pain.  All other systems reviewed and are negative.    Physical Exam Updated Vital Signs BP 159/90 (BP Location: Left Arm)   Pulse 60   Temp 98.4 F (36.9 C) (Oral)   Resp 18   SpO2 97%   Physical Exam  Constitutional: He is oriented to person, place, and time. He appears well-developed and well-nourished.  HENT:  Head: Normocephalic and atraumatic.  Right Ear: External ear normal.  Left Ear:  External ear normal.  Nose: Nose normal.  Eyes: Right eye exhibits no discharge. Left eye exhibits no discharge.  Neck: Neck supple.  Cardiovascular: Normal rate, regular rhythm and normal heart sounds.   Pulmonary/Chest: Effort normal and breath sounds normal.  Abdominal: Soft. There is tenderness in the right upper quadrant. There is positive Murphy's sign.    Musculoskeletal: He exhibits no edema.  Neurological: He is alert and oriented to person, place, and time.  Skin: Skin is warm and dry. He is not diaphoretic.  Nursing note and vitals reviewed.    ED Treatments / Results  Labs (all labs ordered are listed, but only abnormal results are displayed) Labs Reviewed  URINALYSIS, ROUTINE W REFLEX MICROSCOPIC - Abnormal; Notable for the following:       Result Value   Color, Urine STRAW (*)    All other components within normal limits  COMPREHENSIVE METABOLIC PANEL - Abnormal; Notable for the following:    Glucose, Bld 110 (*)    BUN 29 (*)    Creatinine, Ser 1.41 (*)    Total Protein 6.1 (*)    AST 14 (*)    ALT 7 (*)    GFR calc  non Af Amer 45 (*)    GFR calc Af Amer 52 (*)    All other components within normal limits  URINE CULTURE  LIPASE, BLOOD  CBC WITH DIFFERENTIAL/PLATELET    EKG  EKG Interpretation  Date/Time:  Saturday January 25 2016 09:05:58 EST Ventricular Rate:  66 PR Interval:    QRS Duration: 91 QT Interval:  391 QTC Calculation: 410 R Axis:   90 Text Interpretation:  Normal sinus rhythm Probable anterior infarct, old no acute ST/T changes no significant change since Oct 2017 Confirmed by Regenia Skeeter MD, Helina Hullum [phone]) on 01/25/2016 9:26:11 AM       Radiology Dg Chest 2 View  Result Date: 01/25/2016 CLINICAL DATA:  Right lower chest pain. EXAM: CHEST  2 VIEW COMPARISON:  April 21, 2015 FINDINGS: Prominent skin folds over the lateral right lung base. No pneumothorax. The heart, hila, and mediastinum are normal. No pulmonary nodules or masses. No focal infiltrates. IMPRESSION: No active cardiopulmonary disease. Electronically Signed   By: Dorise Bullion III M.D   On: 01/25/2016 09:47   Ct Renal Stone Study  Result Date: 01/25/2016 CLINICAL DATA:  Right flank pain.  History of stones. EXAM: CT ABDOMEN AND PELVIS WITHOUT CONTRAST TECHNIQUE: Multidetector CT imaging of the abdomen and pelvis was performed following the standard protocol without IV contrast. COMPARISON:  August 24, 2007 FINDINGS: Lower chest: Small rounded calcifications are seen in the left base, primarily along pleural surfaces. This is not the typical appearance of asbestosis. The finding is indeterminate but of doubtful acute significance. This is thought to be a chronic process. No acute abnormalities are identified within the lung bases. There is a moderate hiatal hernia. Hepatobiliary: Cholelithiasis is identified. A small amount of fluid is seen adjacent to the gallbladder without definitive wall thickening. The liver is unremarkable. Pancreas: Unremarkable. No pancreatic ductal dilatation or surrounding inflammatory changes. Spleen:  Normal in size without focal abnormality. Adrenals/Urinary Tract: Multiple bilateral renal cysts are identified. A rounded high attenuation mass in the upper left kidney on series 2, image 23 is likely a hyperdense cyst. There is an 8 mm stone in the left lower pole without hydronephrosis. There is a stone in a lower pole infundibulum on the right on series 2, image 42. There is a another smaller stone  on image 40. There is a stone in a mid pole infundibulum measuring 6 mm on image 38. There is moderate right-sided hydronephrosis and perinephric stranding. The proximal right ureter is dilated due to 2 proximal right ureteral stones with the larger more proximal stone measuring 8.3 mm and the smaller more distal stone measuring 5.7 mm. The remainder of the right ureter is normal in appearance. The adrenal glands are normal. Stomach/Bowel: There is a hiatal hernia. The stomach and small bowel are otherwise normal. The colon is normal in appearance. Visualized portions of the appendix are normal as well. Vascular/Lymphatic: Atherosclerotic changes seen in the aorta, iliac vessels, and femoral vessels without aneurysm. No adenopathy identified. Reproductive: Prostate is unremarkable. Other: There is a small amount of ascites in the pelvis. No free air. Musculoskeletal: Scalloping of the superior endplate of L1 is age indeterminate for probably chronic. IMPRESSION: 1. Two obstructing stones in the proximal right ureter with the more distal stone measuring 5.7 mm and the more proximal of the 2 stones measuring 8.3 mm. There is another stone in a right mid pole infundibulum at risk of passing, a stone in a right lower pole infundibulum, and a stone in a right lower calyx. 2. Atherosclerosis in the aorta, iliac vessels, and femoral vessels. 3. Cholelithiasis. There is a small amount of fluid adjacent the gallbladder but this could be secondary to the ascites seen elsewhere. An ultrasound could further evaluate the  gallbladder if warranted. Electronically Signed   By: Dorise Bullion III M.D   On: 01/25/2016 12:29   US Abdomen Limited Ruq  Result Date: 01/25/2016 CLINICAL DATA:  Right-sided abdominal pain. EXAM: US ABDOMEN LIMITED - RIGHT UPPER QUADRANT COMPARISON:  CT, 08/24/2007 FINDINGS: Gallbladder: Multiple gallstones, largest measuring 1.7 cm. No wall thickening. No pericholecystic fluid. Common bile duct: Diameter: Not well visualized.  No dilation.  Approximate 4 mm. Liver: No focal lesion identified. Within normal limits in parenchymal echogenicity. Right-sided hydronephrosis and proximal right ureteral dilation. 10 cm cyst arises from the upper pole. IMPRESSION: 1. Cholelithiasis with no evidence of acute cholecystitis. 2. Right hydronephrosis. Consider a right ureteral stone as the source of this patient's pain, which could be further assessed with unenhanced abdomen and pelvis CT. Electronically Signed   By: Lajean Manes M.D.   On: 01/25/2016 11:43    Procedures Procedures (including critical care time)  Medications Ordered in ED Medications  fentaNYL (SUBLIMAZE) injection 50 mcg (50 mcg Intravenous Given 01/25/16 0915)  sodium chloride 0.9 % bolus 1,000 mL (0 mLs Intravenous Stopped 01/25/16 1108)  fentaNYL (SUBLIMAZE) injection 50 mcg (50 mcg Intravenous Given 01/25/16 1119)  HYDROmorphone (DILAUDID) tablet 2 mg (2 mg Oral Given 01/25/16 1355)  fentaNYL (SUBLIMAZE) injection 50 mcg (50 mcg Intravenous Given 01/25/16 1531)     Initial Impression / Assessment and Plan / ED Course  I have reviewed the triage vital signs and the nursing notes.  Pertinent labs & imaging results that were available during my care of the patient were reviewed by me and considered in my medical decision making (see chart for details).  Clinical Course as of Jan 25 1607  Sat Jan 25, 2016  0848 Given pain is mostly upper abd, start with RUQ u/s. IV pain meds, npo, labs. CXR  [SG]    Clinical Course User Index [SG]  Sherwood Gambler, MD    Patient's pain appears to be from multiple proximal right ureteral stones with concomitant hydronephrosis. Originally planned for outpatient urology follow-up given normal white  blood cell count, better pain control, and no UTI. He is not vomiting. However despite trying oral pain medicine he is still in significant pain and unable to go home due to pain control. Discussed again with urology, Dr. Alyson Ingles, who plans to do procedure in the morning. Asks for hospitalist admission given his comorbidities including diabetes.  D/w Dr Dyann Kief, given this is a primary urologic problem asks for urology to admit and hospitalist will consult. He spoke to Dr. Alyson Ingles who will admit.  Final Clinical Impressions(s) / ED Diagnoses   Final diagnoses:  Right sided abdominal pain  Right ureteral stone    New Prescriptions New Prescriptions   No medications on file     Sherwood Gambler, MD 01/25/16 1703

## 2016-01-25 NOTE — ED Triage Notes (Signed)
Pt c/o R mid abdominal pain radiating towards R flank. Pt c/o frequent urination denies hematuria, pt denies nausea vomiting diarrhea and states bowel movement last night.

## 2016-01-25 NOTE — ED Notes (Signed)
Bed: [id number] Expected date:  Expected time:  Means of arrival:  Comments: 83 yo lower abd pain

## 2016-01-26 ENCOUNTER — Encounter (HOSPITAL_COMMUNITY): Payer: Self-pay | Admitting: Anesthesiology

## 2016-01-26 ENCOUNTER — Observation Stay (HOSPITAL_COMMUNITY): Payer: Medicare Other | Admitting: Anesthesiology

## 2016-01-26 ENCOUNTER — Encounter (HOSPITAL_COMMUNITY)
Admission: EM | Disposition: A | Discharge status: Home / Self Care | Payer: Self-pay | Source: Home / Self Care | Attending: Emergency Medicine

## 2016-01-26 DIAGNOSIS — I129 Hypertensive chronic kidney disease with stage 1 through stage 4 chronic kidney disease, or unspecified chronic kidney disease: Secondary | ICD-10-CM | POA: Diagnosis not present

## 2016-01-26 DIAGNOSIS — J45909 Unspecified asthma, uncomplicated: Secondary | ICD-10-CM | POA: Diagnosis not present

## 2016-01-26 DIAGNOSIS — I10 Essential (primary) hypertension: Secondary | ICD-10-CM | POA: Diagnosis not present

## 2016-01-26 DIAGNOSIS — E039 Hypothyroidism, unspecified: Secondary | ICD-10-CM | POA: Diagnosis not present

## 2016-01-26 DIAGNOSIS — E890 Postprocedural hypothyroidism: Secondary | ICD-10-CM | POA: Diagnosis not present

## 2016-01-26 DIAGNOSIS — N183 Chronic kidney disease, stage 3 (moderate): Secondary | ICD-10-CM | POA: Diagnosis not present

## 2016-01-26 DIAGNOSIS — E785 Hyperlipidemia, unspecified: Secondary | ICD-10-CM | POA: Diagnosis not present

## 2016-01-26 DIAGNOSIS — N132 Hydronephrosis with renal and ureteral calculous obstruction: Secondary | ICD-10-CM | POA: Diagnosis not present

## 2016-01-26 DIAGNOSIS — G4700 Insomnia, unspecified: Secondary | ICD-10-CM | POA: Diagnosis not present

## 2016-01-26 DIAGNOSIS — E1121 Type 2 diabetes mellitus with diabetic nephropathy: Secondary | ICD-10-CM | POA: Diagnosis not present

## 2016-01-26 DIAGNOSIS — I471 Supraventricular tachycardia: Secondary | ICD-10-CM | POA: Diagnosis not present

## 2016-01-26 DIAGNOSIS — N201 Calculus of ureter: Secondary | ICD-10-CM | POA: Diagnosis not present

## 2016-01-26 DIAGNOSIS — I700 Atherosclerosis of aorta: Secondary | ICD-10-CM | POA: Diagnosis not present

## 2016-01-26 DIAGNOSIS — G20 Parkinson's disease: Secondary | ICD-10-CM | POA: Diagnosis not present

## 2016-01-26 DIAGNOSIS — H409 Unspecified glaucoma: Secondary | ICD-10-CM | POA: Diagnosis not present

## 2016-01-26 HISTORY — PX: CYSTOSCOPY WITH STENT PLACEMENT: SHX5790

## 2016-01-26 LAB — GLUCOSE, CAPILLARY
GLUCOSE-CAPILLARY: 117 mg/dL — AB (ref 65–99)
GLUCOSE-CAPILLARY: 183 mg/dL — AB (ref 65–99)
GLUCOSE-CAPILLARY: 142 mg/dL — AB (ref 65–99)
Glucose-Capillary: 119 mg/dL — ABNORMAL HIGH (ref 65–99)

## 2016-01-26 LAB — URINE CULTURE

## 2016-01-26 SURGERY — CYSTOSCOPY, WITH STENT INSERTION
Anesthesia: General | Site: Ureter | Laterality: Right

## 2016-01-26 MED ORDER — LACTATED RINGERS IV SOLN: INTRAVENOUS | Status: DC

## 2016-01-26 MED ORDER — TRAMADOL HCL 50 MG PO TABS: 50.0000 mg | ORAL_TABLET | Freq: Four times a day (QID) | ORAL | 0 refills | Status: DC | PRN

## 2016-01-26 MED ORDER — ONDANSETRON HCL 4 MG/2ML IJ SOLN
INTRAMUSCULAR | Status: AC
  Filled 2016-01-26: qty 2

## 2016-01-26 MED ORDER — ONDANSETRON HCL 4 MG/2ML IJ SOLN
INTRAMUSCULAR | Status: DC | PRN
  Administered 2016-01-26: 4 mg via INTRAVENOUS

## 2016-01-26 MED ORDER — IOHEXOL 300 MG/ML  SOLN
INTRAMUSCULAR | Status: DC | PRN
  Administered 2016-01-26: 10 mL

## 2016-01-26 MED ORDER — MENTHOL 3 MG MT LOZG
1.0000 | LOZENGE | OROMUCOSAL | Status: DC | PRN
  Administered 2016-01-26: 3 mg via ORAL
  Filled 2016-01-26: qty 9

## 2016-01-26 MED ORDER — PHENYLEPHRINE HCL 10 MG/ML IJ SOLN
INTRAMUSCULAR | Status: DC | PRN
  Administered 2016-01-26 (×4): 40 ug via INTRAVENOUS

## 2016-01-26 MED ORDER — FENTANYL CITRATE (PF) 100 MCG/2ML IJ SOLN
INTRAMUSCULAR | Status: AC
  Filled 2016-01-26: qty 2

## 2016-01-26 MED ORDER — LACTATED RINGERS IV SOLN
INTRAVENOUS | Status: DC | PRN
  Administered 2016-01-26: 07:00:00 via INTRAVENOUS

## 2016-01-26 MED ORDER — FENTANYL CITRATE (PF) 100 MCG/2ML IJ SOLN
INTRAMUSCULAR | Status: DC | PRN
  Administered 2016-01-26 (×4): 25 ug via INTRAVENOUS

## 2016-01-26 MED ORDER — SODIUM CHLORIDE 0.9 % IR SOLN
Status: DC | PRN
  Administered 2016-01-26: 3000 mL

## 2016-01-26 MED ORDER — PROPOFOL 10 MG/ML IV BOLUS
INTRAVENOUS | Status: AC
  Filled 2016-01-26: qty 20

## 2016-01-26 MED ORDER — PROPOFOL 10 MG/ML IV BOLUS
INTRAVENOUS | Status: DC | PRN
  Administered 2016-01-26: 80 mg via INTRAVENOUS

## 2016-01-26 MED ORDER — FENTANYL CITRATE (PF) 100 MCG/2ML IJ SOLN: 25.0000 ug | INTRAMUSCULAR | Status: DC | PRN

## 2016-01-26 SURGICAL SUPPLY — 10 items
BAG URO CATCHER STRL LF (MISCELLANEOUS) ×2 IMPLANT
CATH INTERMIT  6FR 70CM (CATHETERS) ×2 IMPLANT
CLOTH BEACON ORANGE TIMEOUT ST (SAFETY) ×2 IMPLANT
GLOVE BIOGEL M 8.0 STRL (GLOVE) ×2 IMPLANT
GOWN STRL REUS W/TWL LRG LVL3 (GOWN DISPOSABLE) ×4 IMPLANT
GUIDEWIRE STR DUAL SENSOR (WIRE) ×2 IMPLANT
MANIFOLD NEPTUNE II (INSTRUMENTS) ×2 IMPLANT
PACK CYSTO (CUSTOM PROCEDURE TRAY) ×2 IMPLANT
STENT URET 6FRX26 CONTOUR (STENTS) ×1 IMPLANT
TUBING CONNECTING 10 (TUBING) ×2 IMPLANT

## 2016-01-26 NOTE — Op Note (Signed)
.  Preoperative diagnosis: right UPJ stone, intractable pain  Postoperative diagnosis: Same  Procedure: 1 cystoscopy 2. right retrograde pyelography 3.  Intraoperative fluoroscopy, under one hour, with interpretation 4. right 6 x 26 JJ stent placement  Attending: Nicolette Bang  Anesthesia: General  Estimated blood loss: None  Drains: Right 6 x 26 JJ ureteral stent without tether  Specimens: None  Antibiotics: Gentamycin  Findings:2 large right UPJ calculi. Moderate hydronephrosis. No masses/lesions in the bladder. Ureteral orifices in normal anatomic location.  Indications: Patient is a 83 year old male with a history of right ureteral calculi and intractable pain.  After discussing treatment options, they decided proceed with right stent placement.  Procedure her in detail: The patient was brought to the operating room and a brief timeout was done to ensure correct patient, correct procedure, correct site.  General anesthesia was administered patient was placed in dorsal lithotomy position.  Their genitalia was then prepped and draped in usual sterile fashion.  A rigid 32 French cystoscope was passed in the urethra and the bladder.  Bladder was inspected free masses or lesions.  the ureteral orifices were in the normal orthotopic locations.  a 6 french ureteral catheter was then instilled into the right ureteral orifice.  a gentle retrograde was obtained and findings noted above.  we then placed a zip wire through the ureteral catheter and advanced up to the renal pelvis.    We then placed a 6 x 26 double-j ureteral stent over the original zip wire.  We then removed the wire and good coil was noted in the the renal pelvis under fluoroscopy and the bladder under direct vision.  the bladder was then drained and this concluded the procedure which was well tolerated by patient.  Complications: None  Condition: Stable, extubated, transferred to PACU  Plan: Patient is to be discharged  home. He will followup as an outpatient for ESWL versus URS

## 2016-01-26 NOTE — Anesthesia Procedure Notes (Signed)
Procedure Name: LMA Insertion Date/Time: 01/26/2016 7:50 AM Performed by: Anne Fu Pre-anesthesia Checklist: Patient identified, Emergency Drugs available, Suction available, Patient being monitored and Timeout performed Patient Re-evaluated:Patient Re-evaluated prior to inductionOxygen Delivery Method: Circle system utilized Preoxygenation: Pre-oxygenation with 100% oxygen Intubation Type: IV induction Ventilation: Mask ventilation without difficulty LMA: LMA inserted LMA Size: 4.0 Number of attempts: 1 Placement Confirmation: positive ETCO2 and breath sounds checked- equal and bilateral Tube secured with: Tape

## 2016-01-26 NOTE — Anesthesia Postprocedure Evaluation (Signed)
Anesthesia Post Note  Patient: [name]  Procedure(s) Performed: Procedure(s) (LRB): CYSTOSCOPY, RIGHT RETROGRADE WITH RIGHT URETERAL STENT PLACEMENT (Right)  Patient location during evaluation: PACU Anesthesia Type: General Level of consciousness: awake Pain management: pain level controlled Vital Signs Assessment: post-procedure vital signs reviewed and stable Respiratory status: spontaneous breathing Cardiovascular status: stable Anesthetic complications: no       Last Vitals:  Vitals:   01/26/16 0845 01/26/16 0900  BP: (!) 165/95 (!) 167/98  Pulse: 70 71  Resp: 12 14  Temp:      Last Pain:  Vitals:   01/26/16 0836  TempSrc:   PainSc: 0-No pain                 Bill Yohn

## 2016-01-26 NOTE — Transfer of Care (Signed)
Immediate Anesthesia Transfer of Care Note  Patient: [name]  Procedure(s) Performed: Procedure(s): CYSTOSCOPY, RIGHT RETROGRADE WITH RIGHT URETERAL STENT PLACEMENT (Right)  Patient Location: PACU  Anesthesia Type:General  Level of Consciousness:  sedated, patient cooperative and responds to stimulation  Airway & Oxygen Therapy:Patient Spontanous Breathing and Patient connected to face mask oxgen  Post-op Assessment:  Report given to PACU RN and Post -op Vital signs reviewed and stable  Post vital signs:  Reviewed and stable  Last Vitals:  Vitals:   01/25/16 2120 01/26/16 0512  BP: 129/81 135/78  Pulse: 74 77  Resp: 20 18  Temp: 36.4 C [id number] C    Complications: No apparent anesthesia complications

## 2016-01-26 NOTE — H&P (Signed)
Urology Admission H&P  Chief Complaint: Right flank pain  History of Present Illness: Mr Crafts is a 83yo with a hx of DMII, HTN, prostate cancer s/p EBRT and nephrolithiasis who presented to the ER with a 6 hour hx of severe sharp, constant, nonradiaiting right flank pain. He had associated nausea but no vomiting. CT scan shows a 4mm and 24mm right proximal ureteral calculus.  No concern for infection. He pain was unable to be controlled in the ER.  Past Medical History:  Diagnosis Date  . Allergic rhinitis   . Arthritis    arthritis-kyphosis  . Asthmatic bronchitis   . Chronic kidney disease   . DM type 2 (diabetes mellitus, type 2) ([place])   . DVT, lower extremity, proximal, acute ([place])   . Dyslipidemia   . Dysrhythmia    '97 tachycardia- no recent issues  . GERD (gastroesophageal reflux disease)   . Hearing impaired    bilateral hearing aids  . History of kidney stones   . History of skin cancer   . Hypertension   . Hypothyroidism   . Macular degeneration    injections done bilaterally recent - 08-22-13  . Prostate cancer [facility])    Prostate cancer-radiation only,skin cancer-basal cell and last squamous cell right eye  . Stroke ([place])   . Urgency-frequency syndrome   . Urticaria    Past Surgical History:  Procedure Laterality Date  . BACK SURGERY     lumbar fracture  . cataract surgery Bilateral   . COLONOSCOPY W/ POLYPECTOMY     x2   . CYSTOSCOPY WITH INJECTION N/A 09/11/2013   Procedure: CYSTOSCOPY WITH BOTOX INJECTION INTO THE BLADDER;  Surgeon: Ailene Rud, MD;  Location: [facility];  Service: Urology;  Laterality: N/A;  . CYSTOSCOPY WITH RETROGRADE PYELOGRAM, URETEROSCOPY AND STENT PLACEMENT    . EYE SURGERY     macular degeneration  . GALLBLADDER SURGERY  2006   no cholecystectomy  . HERNIA REPAIR    . LUNG REMOVAL, PARTIAL  age 86   for bronchiectasis  . PARTIAL THYMECTOMY  1985   for nodules  . POPLITEAL SYNOVIAL CYST EXCISION  2005  . TOTAL KNEE  ARTHROPLASTY  2008   right  . TOTAL KNEE ARTHROPLASTY  2010   left    Home Medications:  Prescriptions Prior to Admission  Medication Sig Dispense Refill Last Dose  . aspirin EC 81 MG tablet Take 81 mg by mouth.   01/24/2016 at Unknown time  . carbidopa-levodopa (SINEMET IR) 25-100 MG tablet Take 1 tablet by mouth 3 (three) times daily. Takes [id number]   01/24/2016 at 2000  . colesevelam (WELCHOL) 625 MG tablet Take 1,250 mg by mouth 3 (three) times daily with meals.    01/24/2016 at Unknown time  . COMBIGAN 0.2-0.5 % ophthalmic solution Place 1 drop into both eyes every 12 (twelve) hours.   0 01/24/2016 at Unknown time  . diltiazem (CARDIZEM CD) 240 MG 24 hr capsule Take 240 mg by mouth daily before breakfast.    01/24/2016 at 0800  . furosemide (LASIX) 20 MG tablet Take 20 mg by mouth every morning.    01/24/2016 at Unknown time  . levothyroxine (SYNTHROID, LEVOTHROID) 200 MCG tablet Take 200 mcg by mouth daily before breakfast. Total of 262mcg   01/24/2016 at Unknown time  . levothyroxine (SYNTHROID, LEVOTHROID) 75 MCG tablet Take 75 mcg by mouth daily before breakfast.   01/24/2016 at Unknown time  . multivitamin-lutein (OCUVITE-LUTEIN) CAPS capsule Take 1 capsule by  mouth daily.   01/24/2016 at Unknown time  . omeprazole (PRILOSEC) 20 MG capsule Take 20 mg by mouth daily.   01/24/2016 at Unknown time  . polyethylene glycol (MIRALAX / GLYCOLAX) packet Take 17 g by mouth daily.   01/24/2016 at Unknown time  . sitaGLIPtin (JANUVIA) 50 MG tablet Take 1 tablet (50 mg total) by mouth daily. 30 tablet 1 01/24/2016 at Unknown time  . zolpidem (AMBIEN) 5 MG tablet Take 5 mg by mouth daily as needed.  0 01/24/2016 at Unknown time  . benzonatate (TESSALON) 200 MG capsule take 1 capsule by mouth three times a day if needed for cough (Patient not taking: Reported on 01/25/2016) 30 capsule 1 Not Taking at Unknown time   Allergies:  Allergies  Allergen Reactions  . Colchicine Anaphylaxis  . Hibiclens  [Chlorhexidine] Hives    Cannot use must use ivory soap  . Atorvastatin Other (See Comments)    Joint soreness  . Ceftin Diarrhea  . Celebrex [Celecoxib] Swelling    edema  . Codeine Hives  . Contrast Media [Iodinated Diagnostic Agents] Nausea And Vomiting and Rash  . Erythromycin Diarrhea  . Ezetimibe Other (See Comments)    Joint soreness  . Oxycodone-Acetaminophen Hives  . Rosuvastatin Other (See Comments)    Joint soreness  . Simvastatin Other (See Comments)    Joint soreness  . Cefpodoxime Rash  . Cefuroxime Axetil Rash  . Nitroglycerin Other (See Comments)    lowers blood pressure   . Tetanus Toxoid Rash  . Vantin Other (See Comments)    Doesn't remember    Family History  Problem Relation Age of Onset  . Other Sister     ophth disease  . Heart attack Father     x7  . Heart disease Father     MI x7  . Hypertension Mother   . Stroke Mother 73   Social History:  reports that he has never smoked. He has never used smokeless tobacco. He reports that he does not drink alcohol or use drugs.  Review of Systems  Genitourinary: Positive for flank pain.  All other systems reviewed and are negative.   Physical Exam:  Vital signs in last 24 hours: Temp:  [97.5 F (36.4 C)-98.4 F (36.9 C)] 98.1 F (36.7 C) (01/14 0512) Pulse Rate:  [60-77] 77 (01/14 0512) Resp:  [13-20] 18 (01/14 0512) BP: (129-196)/(78-106) 135/78 (01/14 0512) SpO2:  [97 %-100 %] 98 % (01/14 0512) Weight:  [64.6 kg (142 lb 6.7 oz)-64.8 kg (142 lb 13.7 oz)] 64.8 kg (142 lb 13.7 oz) (01/13 2022) Physical Exam  Constitutional: He is oriented to person, place, and time. He appears well-developed and well-nourished.  HENT:  Head: Normocephalic and atraumatic.  Eyes: EOM are normal. Pupils are equal, round, and reactive to light.  Neck: Normal range of motion. No thyromegaly present.  Cardiovascular: Normal rate and regular rhythm.   Respiratory: Effort normal. No respiratory distress.  GI: Soft.  He exhibits no distension.  Genitourinary: Penis normal. No penile tenderness.  Musculoskeletal: Normal range of motion. He exhibits no edema.  Neurological: He is alert and oriented to person, place, and time.  Skin: Skin is warm and dry.  Psychiatric: He has a normal mood and affect. His behavior is normal. Judgment and thought content normal.    Laboratory Data:  Results for orders placed or performed during the hospital encounter of 01/25/16 (from the past 24 hour(s))  Comprehensive metabolic panel     Status: Abnormal  Collection Time: 01/25/16  9:10 AM  Result Value Ref Range   Sodium 137 135 - 145 mmol/L   Potassium 3.9 3.5 - 5.1 mmol/L   Chloride 107 101 - 111 mmol/L   CO2 22 22 - 32 mmol/L   Glucose, Bld 110 (H) 65 - 99 mg/dL   BUN 29 (H) 6 - 20 mg/dL   Creatinine, Ser 1.41 (H) 0.61 - 1.24 mg/dL   Calcium 8.9 8.9 - 10.3 mg/dL   Total Protein 6.1 (L) 6.5 - 8.1 g/dL   Albumin 4.0 3.5 - 5.0 g/dL   AST 14 (L) 15 - 41 U/L   ALT 7 (L) 17 - 63 U/L   Alkaline Phosphatase 61 38 - 126 U/L   Total Bilirubin 0.6 0.3 - 1.2 mg/dL   GFR calc non Af Amer 45 (L) >60 mL/min   GFR calc Af Amer 52 (L) >60 mL/min   Anion gap 8 5 - 15  Lipase, blood     Status: None   Collection Time: 01/25/16  9:10 AM  Result Value Ref Range   Lipase 43 11 - 51 U/L  CBC with Differential     Status: None   Collection Time: 01/25/16  9:10 AM  Result Value Ref Range   WBC 8.0 4.0 - 10.5 K/uL   RBC 4.32 4.22 - 5.81 MIL/uL   Hemoglobin 13.3 13.0 - 17.0 g/dL   HCT 39.2 39.0 - 52.0 %   MCV 90.7 78.0 - 100.0 fL   MCH 30.8 26.0 - 34.0 pg   MCHC 33.9 30.0 - 36.0 g/dL   RDW 12.8 11.5 - 15.5 %   Platelets 192 150 - 400 K/uL   Neutrophils Relative % 69 %   Neutro Abs 5.6 1.7 - 7.7 K/uL   Lymphocytes Relative 17 %   Lymphs Abs 1.4 0.7 - 4.0 K/uL   Monocytes Relative 10 %   Monocytes Absolute 0.8 0.1 - 1.0 K/uL   Eosinophils Relative 3 %   Eosinophils Absolute 0.2 0.0 - 0.7 K/uL   Basophils Relative 1 %    Basophils Absolute 0.0 0.0 - 0.1 K/uL  Urinalysis, Routine w reflex microscopic- may I&O cath if menses     Status: Abnormal   Collection Time: 01/25/16  9:14 AM  Result Value Ref Range   Color, Urine STRAW (A) YELLOW   APPearance CLEAR CLEAR   Specific Gravity, Urine 1.011 1.005 - 1.030   pH 5.0 5.0 - 8.0   Glucose, UA NEGATIVE NEGATIVE mg/dL   Hgb urine dipstick NEGATIVE NEGATIVE   Bilirubin Urine NEGATIVE NEGATIVE   Ketones, ur NEGATIVE NEGATIVE mg/dL   Protein, ur NEGATIVE NEGATIVE mg/dL   Nitrite NEGATIVE NEGATIVE   Leukocytes, UA NEGATIVE NEGATIVE  Glucose, capillary     Status: Abnormal   Collection Time: 01/25/16  7:25 PM  Result Value Ref Range   Glucose-Capillary 105 (H) 65 - 99 mg/dL   No results found for this or any previous visit (from the past 240 hour(s)). Creatinine:  Recent Labs  01/25/16 0910  CREATININE 1.41*   Baseline Creatinine: unknwon  Impression/Assessment:  84yo with right ureteral calculi, AKI  Plan:  1. Admit for pain control and NPO after midnight for right stent placement. The risks/benefits/alternatives to right ureteral stent placement was explained to the patient and he understands and wishes to proceed with surgery.   Nicolette Bang 01/26/2016, 6:58 AM

## 2016-01-26 NOTE — Anesthesia Preprocedure Evaluation (Addendum)
Anesthesia Evaluation  Patient identified by MRN, date of birth, ID band Patient awake    Reviewed: Allergy & Precautions, NPO status   History of Anesthesia Complications (+) Emergence Delirium  Airway Mallampati: II  TM Distance: >3 FB     Dental   Pulmonary asthma ,    breath sounds clear to auscultation       Cardiovascular hypertension, + Peripheral Vascular Disease  + dysrhythmias  Rhythm:Regular Rate:Normal     Neuro/Psych TIACVA    GI/Hepatic GERD  ,  Endo/Other  diabetesHypothyroidism   Renal/GU Renal disease     Musculoskeletal  (+) Arthritis ,   Abdominal   Peds  Hematology   Anesthesia Other Findings   Reproductive/Obstetrics                            Anesthesia Physical Anesthesia Plan  ASA: III  Anesthesia Plan: General   Post-op Pain Management:    Induction:   Airway Management Planned: LMA  Additional Equipment:   Intra-op Plan:   Post-operative Plan: Extubation in OR  Informed Consent: I have reviewed the patients History and Physical, chart, labs and discussed the procedure including the risks, benefits and alternatives for the proposed anesthesia with the patient or authorized representative who has indicated his/her understanding and acceptance.   Dental advisory given  Plan Discussed with: CRNA and Anesthesiologist  Anesthesia Plan Comments:         Anesthesia Quick Evaluation

## 2016-01-26 NOTE — Progress Notes (Signed)
PROGRESS NOTE  [name]  [id number] DOB: 1931-12-23 DOA: 01/25/2016 PCP: Mathews Argyle, MD   Brief Narrative: [name] is an 83 y.o. male  with a history of T2DM with stage III CKD nephropathy, HTN, PD, hypothyroidism, and prostate CA s/p EBRT who presented 1/13 with sharp, severe right flank pain that was uncontrolled in the ED, found to be due to right ureteral calculi. Concomitant obstructive uropathy was seen. Urology admitted the patient, planning sten placement and consulted [facility] for management of chronic medical problems.   Assessment & Plan: Active Problems:   Ureteral calculus  Right ureteral calculi with obstructive uropathy: 63mm and 39mm proximal R ureteral stones with hydronephrosis.  - Plan for stent placement 01/26/2016 per primary. Pt is NPO. - Pain control per primary  HTN and h/o SVT: Echo Oct 2016 w/EF 55-60%, mod LVH, no RWMA's. Valves normal with normal PA pressure.  - Continue telemetry x24 hours postop. NSR on and since admission.  - BP elevated on admission, due to pain. Normotensive today. - Continue cardizem. Ok to continue lasix.  - Hydralazine 10mg  IV q6h prn SBP > 117mmHg or DBP > 120mmHg  T2DM, well-controlled: Last HbA1c 6.4% in Oct 2017.  - Hold januvia - Continue SSI   Stage III CKD: Baseline creatinine thought to be 1.1 - 1.2 with suspected AKI on admission with SCr 1.41.  - Monitor BMP, anticipate improvement with stent placement and adequate hydration.  - Avoid nephrotoxins, contrast dye allergy noted.   Hypothyroidism: Chronic, stable. TSH 3.027 in Oct 2017. - Continue home dose synthroid  Parkinson's disease: Chronic, stable - Continue sinemet  Glaucoma: Chronic, stable - Continue home combigan gtt's  Hyperlipidemia: Chronic, stable. Lipids not checked in EPIC since 2016.  - Continue colesevelam  - Recommend FLP on follow up with PCP unless already done and not seen  GERD: Chronic, stable - Continue  PPI  Insomnia: Chronic, stable - Will hold prn [place] given age and high risk for delirium  Subjective: Pt doing well after surgery. No chest pain, dyspnea.   Objective: Vitals:   01/25/16 1845 01/25/16 2022 01/25/16 2120 01/26/16 0512  BP: (!) 196/101  129/81 135/78  Pulse: 75  74 77  Resp: 20  20 18   Temp: 98 F (36.7 C)  97.5 F (36.4 C) 98.1 F (36.7 C)  TempSrc: Oral  Oral Oral  SpO2: 100%  99% 98%  Weight: 64.6 kg (142 lb 6.7 oz) 64.8 kg (142 lb 13.7 oz)    Height: 5\' 8"  (1.727 m)       Intake/Output Summary (Last 24 hours) at 01/26/16 0737 Last data filed at 01/26/16 0645  Gross per 24 hour  Intake              180 ml  Output              750 ml  Net             -570 ml    Examination: General exam: 83 y.o. male in no distress  Respiratory system: Non-labored breathing room air. Clear to auscultation bilaterally.  Cardiovascular system: Regular rate and rhythm. No murmur, rub, or gallop. No JVD, and no pedal edema. Gastrointestinal system: Abdomen soft, non-tender, non-distended, with normoactive bowel sounds. No organomegaly or masses felt. Central nervous system: Alert and oriented. No focal neurological deficits. Extremities: Warm, no deformities Skin: No rashes, lesions no ulcers Psychiatry: Judgement and insight appear normal. Mood & affect appropriate.   Data Reviewed:  I have personally reviewed following labs and imaging studies  CBC:  Recent Labs Lab 01/25/16 0910  WBC 8.0  NEUTROABS 5.6  HGB 13.3  HCT 39.2  MCV 90.7  PLT [id number]   Basic Metabolic Panel:  Recent Labs Lab 01/25/16 0910  NA 137  K 3.9  CL 107  CO2 22  GLUCOSE 110*  BUN 29*  CREATININE 1.41*  CALCIUM 8.9   GFR: Estimated Creatinine Clearance: 37 mL/min (by C-G formula based on SCr of 1.41 mg/dL (H)). Liver Function Tests:  Recent Labs Lab 01/25/16 0910  AST 14*  ALT 7*  ALKPHOS 61  BILITOT 0.6  PROT 6.1*  ALBUMIN 4.0    Recent Labs Lab 01/25/16 0910   LIPASE 43   CBG:  Recent Labs Lab 01/25/16 1925  GLUCAP 105*   Urine analysis:    Component Value Date/Time   COLORURINE STRAW (A) 01/25/2016 0914   APPEARANCEUR CLEAR 01/25/2016 0914   LABSPEC 1.011 01/25/2016 0914   PHURINE 5.0 01/25/2016 0914   GLUCOSEU NEGATIVE 01/25/2016 0914   HGBUR NEGATIVE 01/25/2016 0914   BILIRUBINUR NEGATIVE 01/25/2016 0914   KETONESUR NEGATIVE 01/25/2016 0914   PROTEINUR NEGATIVE 01/25/2016 0914   UROBILINOGEN 0.2 05/26/2014 2040   NITRITE NEGATIVE 01/25/2016 0914   LEUKOCYTESUR NEGATIVE 01/25/2016 0914   Radiology Studies: Dg Chest 2 View  Result Date: 01/25/2016 CLINICAL DATA:  Right lower chest pain. EXAM: CHEST  2 VIEW COMPARISON:  April 21, 2015 FINDINGS: Prominent skin folds over the lateral right lung base. No pneumothorax. The heart, hila, and mediastinum are normal. No pulmonary nodules or masses. No focal infiltrates. IMPRESSION: No active cardiopulmonary disease. Electronically Signed   By: Dorise Bullion III M.D   On: 01/25/2016 09:47   Ct Renal Stone Study  Result Date: 01/25/2016 CLINICAL DATA:  Right flank pain.  History of stones. EXAM: CT ABDOMEN AND PELVIS WITHOUT CONTRAST TECHNIQUE: Multidetector CT imaging of the abdomen and pelvis was performed following the standard protocol without IV contrast. COMPARISON:  August 24, 2007 FINDINGS: Lower chest: Small rounded calcifications are seen in the left base, primarily along pleural surfaces. This is not the typical appearance of asbestosis. The finding is indeterminate but of doubtful acute significance. This is thought to be a chronic process. No acute abnormalities are identified within the lung bases. There is a moderate hiatal hernia. Hepatobiliary: Cholelithiasis is identified. A small amount of fluid is seen adjacent to the gallbladder without definitive wall thickening. The liver is unremarkable. Pancreas: Unremarkable. No pancreatic ductal dilatation or surrounding inflammatory  changes. Spleen: Normal in size without focal abnormality. Adrenals/Urinary Tract: Multiple bilateral renal cysts are identified. A rounded high attenuation mass in the upper left kidney on series 2, image 23 is likely a hyperdense cyst. There is an 8 mm stone in the left lower pole without hydronephrosis. There is a stone in a lower pole infundibulum on the right on series 2, image 42. There is a another smaller stone on image 40. There is a stone in a mid pole infundibulum measuring 6 mm on image 38. There is moderate right-sided hydronephrosis and perinephric stranding. The proximal right ureter is dilated due to 2 proximal right ureteral stones with the larger more proximal stone measuring 8.3 mm and the smaller more distal stone measuring 5.7 mm. The remainder of the right ureter is normal in appearance. The adrenal glands are normal. Stomach/Bowel: There is a hiatal hernia. The stomach and small bowel are otherwise normal. The colon is normal in  appearance. Visualized portions of the appendix are normal as well. Vascular/Lymphatic: Atherosclerotic changes seen in the aorta, iliac vessels, and femoral vessels without aneurysm. No adenopathy identified. Reproductive: Prostate is unremarkable. Other: There is a small amount of ascites in the pelvis. No free air. Musculoskeletal: Scalloping of the superior endplate of L1 is age indeterminate for probably chronic. IMPRESSION: 1. Two obstructing stones in the proximal right ureter with the more distal stone measuring 5.7 mm and the more proximal of the 2 stones measuring 8.3 mm. There is another stone in a right mid pole infundibulum at risk of passing, a stone in a right lower pole infundibulum, and a stone in a right lower calyx. 2. Atherosclerosis in the aorta, iliac vessels, and femoral vessels. 3. Cholelithiasis. There is a small amount of fluid adjacent the gallbladder but this could be secondary to the ascites seen elsewhere. An ultrasound could further  evaluate the gallbladder if warranted. Electronically Signed   By: Dorise Bullion III M.D   On: 01/25/2016 12:29   US Abdomen Limited Ruq  Result Date: 01/25/2016 CLINICAL DATA:  Right-sided abdominal pain. EXAM: US ABDOMEN LIMITED - RIGHT UPPER QUADRANT COMPARISON:  CT, 08/24/2007 FINDINGS: Gallbladder: Multiple gallstones, largest measuring 1.7 cm. No wall thickening. No pericholecystic fluid. Common bile duct: Diameter: Not well visualized.  No dilation.  Approximate 4 mm. Liver: No focal lesion identified. Within normal limits in parenchymal echogenicity. Right-sided hydronephrosis and proximal right ureteral dilation. 10 cm cyst arises from the upper pole. IMPRESSION: 1. Cholelithiasis with no evidence of acute cholecystitis. 2. Right hydronephrosis. Consider a right ureteral stone as the source of this patient's pain, which could be further assessed with unenhanced abdomen and pelvis CT. Electronically Signed   By: Lajean Manes M.D.   On: 01/25/2016 11:43    Vance Gather, MD [facility] Pager [phone]  If 7PM-7AM, please contact night-coverage [email or web address] Password [facility] 01/26/2016, 7:37 AM

## 2016-01-26 NOTE — Progress Notes (Signed)
Bilateral Hearing aids and 2 rings given to patient upon arrival to room, patient placed hearing aids in ears and rings on fingers.

## 2016-01-27 ENCOUNTER — Encounter (HOSPITAL_COMMUNITY): Payer: Self-pay | Admitting: Urology

## 2016-01-27 DIAGNOSIS — E119 Type 2 diabetes mellitus without complications: Secondary | ICD-10-CM

## 2016-01-27 DIAGNOSIS — N132 Hydronephrosis with renal and ureteral calculous obstruction: Secondary | ICD-10-CM | POA: Diagnosis not present

## 2016-01-27 DIAGNOSIS — N201 Calculus of ureter: Secondary | ICD-10-CM | POA: Diagnosis not present

## 2016-01-27 DIAGNOSIS — I10 Essential (primary) hypertension: Secondary | ICD-10-CM | POA: Diagnosis not present

## 2016-01-27 LAB — BASIC METABOLIC PANEL
Anion gap: 9 (ref 5–15)
BUN: 27 mg/dL — AB (ref 6–20)
CHLORIDE: 100 mmol/L — AB (ref 101–111)
CO2: 26 mmol/L (ref 22–32)
CREATININE: 1.51 mg/dL — AB (ref 0.61–1.24)
Calcium: 8.6 mg/dL — ABNORMAL LOW (ref 8.9–10.3)
GFR calc Af Amer: 48 mL/min — ABNORMAL LOW (ref >60)
GFR calc non Af Amer: 41 mL/min — ABNORMAL LOW (ref >60)
Glucose, Bld: 118 mg/dL — ABNORMAL HIGH (ref 65–99)
Potassium: 3.9 mmol/L (ref 3.5–5.1)
Sodium: 135 mmol/L (ref 135–145)

## 2016-01-27 LAB — GLUCOSE, CAPILLARY
GLUCOSE-CAPILLARY: 114 mg/dL — AB (ref 65–99)
Glucose-Capillary: 193 mg/dL — ABNORMAL HIGH (ref 65–99)

## 2016-01-27 MED ORDER — HYOSCYAMINE SULFATE 0.125 MG PO TABS
0.1250 mg | ORAL_TABLET | ORAL | Status: DC | PRN
  Filled 2016-01-27: qty 1

## 2016-01-27 MED ORDER — MIRABEGRON ER 50 MG PO TB24
50.0000 mg | ORAL_TABLET | Freq: Every day | ORAL | Status: DC
  Administered 2016-01-27: 50 mg via ORAL
  Filled 2016-01-27: qty 1

## 2016-01-27 MED ORDER — MIRABEGRON ER 50 MG PO TB24: 50.0000 mg | ORAL_TABLET | Freq: Every day | ORAL | 0 refills | Status: AC

## 2016-01-27 MED ORDER — HYOSCYAMINE SULFATE 0.125 MG SL SUBL
0.1250 mg | SUBLINGUAL_TABLET | SUBLINGUAL | Status: DC | PRN
  Administered 2016-01-27: 0.125 mg via SUBLINGUAL
  Filled 2016-01-27 (×2): qty 1

## 2016-01-27 MED ORDER — URIBEL 118 MG PO CAPS: 1.0000 | ORAL_CAPSULE | Freq: Three times a day (TID) | ORAL | 0 refills | Status: DC | PRN

## 2016-01-27 NOTE — Care Management Obs Status (Signed)
[place] NOTIFICATION   Patient Details  Name: [name] MRN: [id number] Date of Birth: Jun 16, 1931   Medicare Observation Status Notification Given:  Yes    MahabirJuliann Pulse, RN 01/27/2016, 10:11 AM

## 2016-01-27 NOTE — Care Management Note (Signed)
Case Management Note  Patient Details  Name: [name] MRN: [id number] Date of Birth: [birth year]  Subjective/Objective:  83 y/o m admitted w/ureteral calculus. From home.  No needs or orders.                Action/Plan:d/c home.   Expected Discharge Date:                 Expected Discharge Plan:  Home/Self Care  In-House Referral:     Discharge planning Services  CM Consult  Post Acute Care Choice:    Choice offered to:     DME Arranged:    DME Agency:     HH Arranged:    [place] Agency:     Status of Service:  Completed, signed off  If discussed at [employer] of Stay Meetings, dates discussed:    Additional Comments:  Dessa Phi, RN 01/27/2016, 10:12 AM

## 2016-01-27 NOTE — Progress Notes (Signed)
PROGRESS NOTE  [name]  [id number] DOB: 05-15-1931 DOA: 01/25/2016 PCP: Mathews Argyle, MD   Brief Narrative: [name] is an 83 y.o. male  with a history of T2DM with stage III CKD nephropathy, HTN, PD, hypothyroidism, and prostate CA s/p EBRT who presented 1/13 with sharp, severe right flank pain that was uncontrolled in the ED, found to be due to right ureteral calculi. Concomitant obstructive uropathy was seen. Urology admitted the patient, planning sten placement and consulted [facility] for management of chronic medical problems.   Assessment & Plan: Active Problems:   Ureteral calculus  Right ureteral calculi with obstructive uropathy: 90mm and 25mm proximal R ureteral stones with hydronephrosis.  - S/p stent placement 01/26/2016 per primary.  - Pain control per primary  HTN and h/o SVT: Echo Oct 2016 w/EF 55-60%, mod LVH, no RWMA's. Valves normal with normal PA pressure.  - BP elevated on admission, due to pain. Remains intermittently elevated, suspect end-dose response in AM prior to receiving AM meds. Would not make changes to hypertensive regimen acutely, but will need follow up with PCP. - Will sign off. Please call with questions. - Continue cardizem. Ok to continue lasix.  - Hydralazine 10mg  IV q6h prn SBP > 139mmHg or DBP > 112mmHg  T2DM, well-controlled: Last HbA1c 6.4% in Oct 2017.  - Hold januvia - Continue SSI: at inpatient goal.  Stage III CKD: Baseline creatinine thought to be 1.1 - 1.2 with suspected AKI on admission with SCr 1.41.  - Monitor BMP, anticipate improvement with stent placement and adequate hydration.  - Avoid nephrotoxins, contrast dye allergy noted.   Hypothyroidism: Chronic, stable. TSH 3.027 in Oct 2017. - Continue home dose synthroid  Parkinson's disease: Chronic, stable - Continue sinemet  Glaucoma: Chronic, stable - Continue home combigan gtt's  Hyperlipidemia: Chronic, stable. Lipids not checked in EPIC since 2016.  -  Continue colesevelam  - Recommend FLP on follow up with PCP unless already done and not visible on EMR  GERD: Chronic, stable - Continue PPI  Insomnia: Chronic, stable - Recommend hold prn ambien given age and high risk for delirium  Subjective: Pt without complaints. No chest pain, dyspnea, orthopnea, leg swelling.   Objective: Vitals:   01/26/16 1450 01/26/16 2123 01/27/16 0530 01/27/16 0651  BP: 114/65 130/81 (!) 176/99 (!) 156/96  Pulse: 63 65 74   Resp: 18 18 18    Temp: 98.1 F (36.7 C) 98.9 F (37.2 C) 98.8 F (37.1 C)   TempSrc: Oral Axillary Oral   SpO2: 97% 100% 97%   Weight:      Height:        Intake/Output Summary (Last 24 hours) at 01/27/16 1242 Last data filed at 01/27/16 0600  Gross per 24 hour  Intake              120 ml  Output             1200 ml  Net            -1080 ml    Examination: General exam: Elderly male in no distress  Respiratory system: Non-labored breathing room air. Clear to auscultation bilaterally.  Cardiovascular system: Regular rate and rhythm. No murmur, rub, or gallop. No JVD, and no pedal edema. Gastrointestinal system: Abdomen soft, non-tender, non-distended, with normoactive bowel sounds. No organomegaly or masses felt. Central nervous system: Alert and oriented. No focal neurological deficits. Extremities: Warm, no deformities Skin: No rashes, lesions no ulcers Psychiatry: Judgement and insight appear  normal. Mood & affect appropriate.   Data Reviewed: I have personally reviewed following labs and imaging studies  CBC:  Recent Labs Lab 01/25/16 0910  WBC 8.0  NEUTROABS 5.6  HGB 13.3  HCT 39.2  MCV 90.7  PLT [id number]   Basic Metabolic Panel:  Recent Labs Lab 01/25/16 0910 01/27/16 0517  NA 137 135  K 3.9 3.9  CL 107 100*  CO2 22 26  GLUCOSE 110* 118*  BUN 29* 27*  CREATININE 1.41* 1.51*  CALCIUM 8.9 8.6*   GFR: Estimated Creatinine Clearance: 34.6 mL/min (by C-G formula based on SCr of 1.51 mg/dL  (H)). Liver Function Tests:  Recent Labs Lab 01/25/16 0910  AST 14*  ALT 7*  ALKPHOS 61  BILITOT 0.6  PROT 6.1*  ALBUMIN 4.0    Recent Labs Lab 01/25/16 0910  LIPASE 43   CBG:  Recent Labs Lab 01/26/16 1204 01/26/16 1731 01/26/16 2128 01/27/16 0740 01/27/16 1200  GLUCAP 117* 183* 142* 114* 193*   Urine analysis:    Component Value Date/Time   COLORURINE STRAW (A) 01/25/2016 0914   APPEARANCEUR CLEAR 01/25/2016 0914   LABSPEC 1.011 01/25/2016 0914   PHURINE 5.0 01/25/2016 0914   GLUCOSEU NEGATIVE 01/25/2016 0914   HGBUR NEGATIVE 01/25/2016 0914   BILIRUBINUR NEGATIVE 01/25/2016 0914   KETONESUR NEGATIVE 01/25/2016 0914   PROTEINUR NEGATIVE 01/25/2016 0914   UROBILINOGEN 0.2 05/26/2014 2040   NITRITE NEGATIVE 01/25/2016 0914   LEUKOCYTESUR NEGATIVE 01/25/2016 0914   Radiology Studies: Dg Chest 2 View 01/25/2016 IMPRESSION: No active cardiopulmonary disease. Electronically Signed   By: Dorise Bullion III M.D   On: 01/25/2016 09:47   Ct Renal Stone Study IMPRESSION: 1. Two obstructing stones in the proximal right ureter with the more distal stone measuring 5.7 mm and the more proximal of the 2 stones measuring 8.3 mm. There is another stone in a right mid pole infundibulum at risk of passing, a stone in a right lower pole infundibulum, and a stone in a right lower calyx. 2. Atherosclerosis in the aorta, iliac vessels, and femoral vessels. 3. Cholelithiasis. There is a small amount of fluid adjacent the gallbladder but this could be secondary to the ascites seen elsewhere. An ultrasound could further evaluate the gallbladder if warranted. Electronically Signed   By: Dorise Bullion III M.D   On: 01/25/2016 12:29   IMPRESSION: 1. Cholelithiasis with no evidence of acute cholecystitis. 2. Right hydronephrosis. Consider a right ureteral stone as the source of this patient's pain, which could be further assessed with unenhanced abdomen and pelvis CT. Electronically Signed    By: Lajean Manes M.D.   On: 01/25/2016 11:43   Vance Gather, MD [facility] Pager [phone]

## 2016-02-04 DIAGNOSIS — N201 Calculus of ureter: Secondary | ICD-10-CM | POA: Diagnosis not present

## 2016-02-06 ENCOUNTER — Other Ambulatory Visit: Payer: Self-pay | Admitting: Urology

## 2016-02-06 ENCOUNTER — Ambulatory Visit (INDEPENDENT_AMBULATORY_CARE_PROVIDER_SITE_OTHER): Payer: Medicare Other | Admitting: Internal Medicine

## 2016-02-06 ENCOUNTER — Encounter: Payer: Self-pay | Admitting: Internal Medicine

## 2016-02-06 VITALS — BP 112/60 | HR 68 | Ht 70.0 in | Wt 149.0 lb

## 2016-02-06 DIAGNOSIS — J208 Acute bronchitis due to other specified organisms: Secondary | ICD-10-CM

## 2016-02-06 DIAGNOSIS — J302 Other seasonal allergic rhinitis: Secondary | ICD-10-CM | POA: Diagnosis not present

## 2016-02-06 DIAGNOSIS — J209 Acute bronchitis, unspecified: Secondary | ICD-10-CM

## 2016-02-06 DIAGNOSIS — J3089 Other allergic rhinitis: Secondary | ICD-10-CM | POA: Diagnosis not present

## 2016-02-06 NOTE — Progress Notes (Signed)
Patient ID: [name], male    DOB: 11/12/1931, 83 y.o.   MRN: [id number]  HPI male never smoker followed for recurrent bronchitis, rhinitis, complicated by history urticaria, PA fib, DM, chronic insomnia  -------------------------------------------  08/05/2015-83 year old male never smoker followed for recurrent bronchitis, complicated by history urticaria, PAFib, DM, chronic insomnia FOLLOWS FOR: Pt states he has trouble with breathing with exertion and no particular time of day that is worse. Pt also has PND all the time. Wife here No evident new concerns. Remains bothered by his sensation of postnasal drip and shortness of breath with exertion which are perennial without acute exacerbation. Podiatrist treating infection on foot after ingrown toenail. CXR 04/21/2015- stable peripheral left base scarring IMPRESSION: No acute cardiopulmonary disease. Stable appearance from the prior study. Electronically Signed   By: Lajean Manes M.D.   On: 04/21/2015 10:58  02/06/2016-83 year old male never smoker followed for recurrent bronchitis, rhinitis, complicated by history urticaria, PA fib, DM, chronic insomnia FOLLOWS FOR: Pt states he was recently in hospital for kidney stone about 1 week ago. Pt also had cough and nasal drainage(continues to have) Cough when eating-food seems to linger in his throat suggesting dysphagia. No significant respiratory event. Had fallen hitting nose with some residual right nostril epistaxis but he can breathe through a comfortably. Pending lithotripsy.  Review of Systems-see HPI Constitutional:   No-   weight loss, night sweats, fevers, chills, fatigue, lassitude. HEENT:   No-  headaches, difficulty swallowing, tooth/dental problems, sore throat, + epistaxis      No-  sneezing, itching, ear ache, +nasal congestion, +post nasal drip,  CV:  No-   chest pain, orthopnea, PND, swelling in lower extremities, anasarca, or dizziness, palpitations Resp: +    shortness of breath with exertion or at rest.            + productive cough,  No non-productive cough,  No- coughing up of blood.              No-   change in color of mucus.  No- wheezing.   Skin: No-   rash or lesions. GI:  No-   heartburn, indigestion, abdominal pain, nausea, vomiting, GU:  MS:  No-   joint pain or swelling.    . Neuro-     nothing unusual Psych:  No- change in mood or affect. No depression or anxiety.  No memory loss.  Objective:   Physical Exam General- Alert, Oriented, Affect-appropriate, Distress- none acute, + frail thin elderly man Skin- rash-none, lesions- none, excoriation- none Lymphadenopathy- none Head- atraumatic            Eyes- Gross vision intact, PERRLA, conjunctivae clear secretions            Ears- Hearing aid, +HOH            Nose- +pale and watery, Septal dev +, mucus, polyps, erosion, perforation             Throat- Mallampati II-III  , mucus+ clear , drainage- none, tonsils- atrophic Neck- flexible , trachea midline, no stridor , thyroid nl, carotid no bruit Chest - symmetrical excursion , unlabored           Heart/CV- RRR , no murmur , no gallop  , no rub, nl s1 s2                           - JVD- none , edema- none, stasis changes- none,  varices- none           Lung- clear unlabored, cough-none , dullness-none, rub- none           Chest wall- Abd- Br/ Gen/ Rectal- Not done, not indicated Extrem- cyanosis- none, clubbing, none, atrophy- none, strength- nl Neuro- grossly intact to observation, hesitant speech

## 2016-02-06 NOTE — Assessment & Plan Note (Signed)
Mostly atrophic rhinitis now with low-grade perennial postnasal drip. Recent fall hitting nose and resulting in epistaxis which is mostly resolved. Plan-nasal saline spray if needed

## 2016-02-06 NOTE — Patient Instructions (Signed)
You can try a little saline nose spray in your nose to keep it clear if you need to.  I hope everything does well. Please call if we can help

## 2016-02-06 NOTE — Assessment & Plan Note (Signed)
Prone to recurrent bronchitis. Discussion with him today exploring possible risk for aspiration. Currently chest is clear and he denies lower respiratory problems. This is baseline for him.

## 2016-02-07 ENCOUNTER — Encounter (HOSPITAL_COMMUNITY): Payer: Self-pay

## 2016-02-09 NOTE — H&P (Signed)
On 01/26/16, due to intractable pain due to a right UPJ stone he underwent cystoscopy with stent placement. HPI: [name] is a 83 year-old male established patient who is here for a post operative visit.  Is tolerating stent well. Denies passing any stone material.    The surgery he had done was cystourethroscopy placement of (R) double J stent by Dr. Alyson Ingles. His surgery was done 01/26/2016.   He has not had post operative nausea. He has not had vomiting. He has not had post operative fever. He has not had post operative chills. He does have a good appetite. BOWEL HABITS: his bowels are moving normally.     ALLERGIES: Atorvastatin cefpodoxime Ceftin TABS Cefuroxime Axetil Celebrex Codeine Derivatives Colchicine TABS Contrast Dye Erythromycin TABS ezetimibe Flomax CP24 Hibiclens Nitrostat SUBL rosuvastatin Simvastatin Tetanus Toxoid Adsorbed SOLN Vantin TABS     Notes: All of the above allergies either cause hives or side effect. No anaphylaxis   MEDICATIONS: Myrbetriq 50 mg tablet, extended release 24 hr 1 tablet PO Daily  Omeprazole  Afrin  Albuterol Sulfate  Aspirin 81 MG Oral Tablet Chewable Oral  Combigan  Dextrose  DilTIAZem HCl TABS Oral  Fluticasone Propionate  Furosemide 20 MG Oral Tablet Oral  Multivitamin  Synthroid 50 MCG Oral Tablet Oral  Tramadol Hcl 50 mg tablet 1 tablet PO Q 6 H PRN  Tums  Tylenol  Uribel 118 mg-10 mg-40.8 mg-36 mg-0.12 mg capsule 1 capsule PO TID PRN  Vitamin D CAPS Oral  Welchol 625 MG Oral Tablet Oral  Zolpidem Tartrate 5 MG Oral Tablet Oral     GU PSH: Cystoscopy Insert Stent - 01/26/2016 Urethrolysis - 09/11/2013    NON-GU PSH: Cataract Surgery, Bilateral Diagnostic Colonoscopy - 2009 Lung Surgery (Unspecified) - 2010 Revise Knee Joint - 2010 Thyroid Surgery Total Knee Replacement, Bilateral    GU PMH: Urinary incontinence, Unspec - 08/23/2015 Overactive bladder, Overactive bladder - 11/19/2014 Prostate Cancer,  Adenocarcinoma of prostate - 11/19/2014 Urge incontinence, Urge incontinence of urine - 11/19/2014 Bladder-neck obstruction, Bladder neck contracture - 04/18/2014 Nocturia, Nocturia - 10/18/2013 Prostate Cancer, History, Personal history of prostate cancer - 08/21/2013 Calculus Ureter, Calculus of ureter - 2014 Dysuria, Dysuria - 2014 ED, arterial insufficiency, Erectile dysfunction due to arterial insufficiency - 2014 Hematuria, Unspec, Hematuria - 2014 Inflammatory Disease Prostate, Unspec, Prostatitis - 2014 Kidney Stone, Bilateral kidney stones - 2014 Renal Cysts, Simple, Renal cyst, acquired - 2014 Unil Inguinal Hernia W/O obst or gang,non-recurrent, Inguinal hernia, unilateral - 2014 Urinary Tract Inf, Unspec site, Urinary tract infection - 2014    NON-GU PMH: Radiation sickness, unspecified, sequela, Late effect of radiation - 11/19/2014 Acute embolism and thrombosis of unspecified deep veins of unspecified lower extremity, DVT of lower extremity (deep venous thrombosis) - 04/18/2014 Encounter for general adult medical examination without abnormal findings, Encounter for preventive health examination - 04/18/2014 Personal history of other diseases of the digestive system, History of esophageal reflux - 2014 Personal history of other endocrine, nutritional and metabolic disease, History of hypercholesterolemia - 2014, History of diabetes mellitus, - 2014 Unspecified atrial fibrillation, Atrial Fibrillation - 2014 Unspecified macular degeneration    FAMILY HISTORY: 1 Daughter - Daughter 1 son - Son Acute Myocardial Infarction - Father Death In The Family Father - Father Death In The Family Mother - Mother Ischemic Stroke - Mother   SOCIAL HISTORY: Marital Status: Married Current Smoking Status: Patient has never smoked.  Does not use smokeless tobacco. Has never drank.  Does not use drugs. Drinks  4+ caffeinated drinks per day. Patient's occupation is/was retired.    REVIEW OF  SYSTEMS:    GU Review Male:   Patient reports hard to postpone urination and burning/ pain with urination. Patient denies frequent urination, get up at night to urinate, leakage of urine, stream starts and stops, trouble starting your stream, have to strain to urinate , erection problems, and penile pain.  Gastrointestinal (Upper):   Patient denies nausea, vomiting, and indigestion/ heartburn.  Gastrointestinal (Lower):   Patient denies diarrhea and constipation.  Constitutional:   Patient denies fatigue, fever, night sweats, and weight loss.  Skin:   Patient denies skin rash/ lesion and itching.  Eyes:   Patient denies blurred vision and double vision.  Ears/ Nose/ Throat:   Patient denies sore throat and sinus problems.  Hematologic/Lymphatic:   Patient denies swollen glands and easy bruising.  Cardiovascular:   Patient denies leg swelling and chest pains.  Respiratory:   Patient denies cough and shortness of breath.  Endocrine:   Patient denies excessive thirst.  Musculoskeletal:   Patient denies back pain and joint pain.  Neurological:   Patient denies headaches and dizziness.  Psychologic:   Patient denies depression and anxiety.   VITAL SIGNS:    Weight 155 lb / 70.31 kg  Height 71 in / 180.34 cm  BP 124/70 mmHg  Pulse 85 /min  Temperature 97.6 F / 36 C  BMI 21.6 kg/m  Physical Exam  Constitutional: Well nourished and well developed . No acute distress.   ENT:. The ears and nose are normal in appearance.   Neck: The appearance of the neck is normal and no neck mass is present.   Pulmonary: No respiratory distress and normal respiratory rhythm and effort.   Cardiovascular: Heart rate and rhythm are normal . No peripheral edema.   Abdomen: The abdomen is soft and nontender. No masses are palpated. No CVA tenderness. No hernias are palpable. No hepatosplenomegaly noted.   Rectal: Rectal exam demonstrates decreased sphincter tone. The prostate is smooth and flat. The prostate  has no nodularity and is not indurated involving the entire right lobe of the prostate which appears to be confined within the prostate capsule. The left seminal vesicle is not tender and without masses. The right seminal vesicle is without masses and not tender.   Genitourinary: Examination of the penis demonstrates no discharge, no masses, no lesions and a normal meatus. The penis is uncircumcised. The scrotum is without lesions. The right epididymis is palpably normal and non-tender. The left epididymis is palpably normal and non-tender. The right testis is non-tender and without masses. The left testis is non-tender and without masses.   Lymphatics: The femoral and inguinal nodes are not enlarged or tender.   Skin: Normal skin turgor, no visible rash and no visible skin lesions.   Neuro/Psych:. Mood and affect are appropriate.     PAST DATA REVIEWED:  Source Of History:  Patient  Records Review:   Previous Hospital Records, Previous Patient Records  Urine Test Review:   Urinalysis   11/13/14 08/22/13 08/12/12 07/20/11 06/30/10 01/07/10 07/08/09 01/02/09  PSA  Total PSA 0.67  0.95  0.54  0.69  0.51  0.46  0.44  0.47     PROCEDURES:         KUB - [id number]  A single view of the abdomen is obtained.      Right ureteral stent in good position. Noted w/in renal pelvis curl large right UPJ calculus. No distal calcifications  noted. Right stable lower pole calculus. Stable left renal pelvis calculus.          Urinalysis w/Scope Dipstick Dipstick Cont'd Micro  Color: Brown Bilirubin: Neg WBC/hpf: 6 - 10/hpf  Appearance: Cloudy Ketones: Neg RBC/hpf: >60/hpf  Specific Gravity: 1.025 Blood: 3+ Bacteria: Few (10-25/hpf)  pH: 6.0 Protein: 3+ Cystals: NS (Not Seen)  Glucose: Neg Urobilinogen: 0.2 Casts: NS (Not Seen)    Nitrites: Neg Trichomonas: Not Present    Leukocyte Esterase: 3+ Mucous: Not Present      Epithelial Cells: NS (Not Seen)      Yeast: NS (Not Seen)      Sperm: Not Present     Notes: unconcentrated microscopic    ASSESSMENT:      ICD-10 Details  2 GU:   Calculus Ureter - N20.1 Right, Chronic - Recommend pt proceed with elective (R) ESWL. Culture urine. No ABX unless culture proven UTI  1 NON-GU:   Other specified postprocedural states - Z98.89 Right ureteral stent in good position. Noted w/in renal pelvis curl large right UPJ calculus. No distal calcifications noted. Right stable lower pole calculus. Stable left renal pelvis calculus.    PLAN:       I went over the procedure of extracorporal shockwave lithotripsy with the patient in quite detail. He understands the risk of poor fragmentation resulting in further ureteral intervention. He also understands that possibility of needing additional procedures to clear stones. Finally, we discussed the risks of hematoma. The patient is willing to proceed.

## 2016-02-10 ENCOUNTER — Ambulatory Visit (HOSPITAL_COMMUNITY)
Admission: RE | Admit: 2016-02-10 | Discharge: 2016-02-10 | Disposition: A | Discharge status: Home / Self Care | Payer: Medicare Other | Source: Ambulatory Visit | Attending: Urology | Admitting: Urology

## 2016-02-10 ENCOUNTER — Encounter (HOSPITAL_COMMUNITY): Payer: Self-pay | Admitting: *Deleted

## 2016-02-10 ENCOUNTER — Encounter (HOSPITAL_COMMUNITY)
Admission: RE | Disposition: A | Discharge status: Home / Self Care | Payer: Self-pay | Source: Ambulatory Visit | Attending: Urology

## 2016-02-10 ENCOUNTER — Ambulatory Visit (HOSPITAL_COMMUNITY): Payer: Medicare Other

## 2016-02-10 DIAGNOSIS — N200 Calculus of kidney: Secondary | ICD-10-CM | POA: Diagnosis not present

## 2016-02-10 DIAGNOSIS — N202 Calculus of kidney with calculus of ureter: Secondary | ICD-10-CM | POA: Diagnosis not present

## 2016-02-10 DIAGNOSIS — Z881 Allergy status to other antibiotic agents status: Secondary | ICD-10-CM | POA: Diagnosis not present

## 2016-02-10 DIAGNOSIS — Z8546 Personal history of malignant neoplasm of prostate: Secondary | ICD-10-CM | POA: Insufficient documentation

## 2016-02-10 DIAGNOSIS — Z79899 Other long term (current) drug therapy: Secondary | ICD-10-CM | POA: Diagnosis not present

## 2016-02-10 DIAGNOSIS — E7800 Pure hypercholesterolemia, unspecified: Secondary | ICD-10-CM | POA: Diagnosis not present

## 2016-02-10 DIAGNOSIS — E119 Type 2 diabetes mellitus without complications: Secondary | ICD-10-CM | POA: Insufficient documentation

## 2016-02-10 DIAGNOSIS — Z7982 Long term (current) use of aspirin: Secondary | ICD-10-CM | POA: Diagnosis not present

## 2016-02-10 DIAGNOSIS — N201 Calculus of ureter: Secondary | ICD-10-CM | POA: Diagnosis not present

## 2016-02-10 HISTORY — PX: EXTRACORPOREAL SHOCK WAVE LITHOTRIPSY: SHX1557

## 2016-02-10 LAB — GLUCOSE, CAPILLARY: Glucose-Capillary: 103 mg/dL — ABNORMAL HIGH (ref 65–99)

## 2016-02-10 SURGERY — LITHOTRIPSY, ESWL
Anesthesia: LOCAL | Laterality: Right

## 2016-02-10 MED ORDER — DIPHENHYDRAMINE HCL 25 MG PO CAPS
25.0000 mg | ORAL_CAPSULE | ORAL | Status: AC
  Administered 2016-02-10: 25 mg via ORAL
  Filled 2016-02-10: qty 1

## 2016-02-10 MED ORDER — CIPROFLOXACIN HCL 500 MG PO TABS
500.0000 mg | ORAL_TABLET | ORAL | Status: AC
  Administered 2016-02-10: 500 mg via ORAL
  Filled 2016-02-10: qty 1

## 2016-02-10 MED ORDER — ALFUZOSIN HCL ER 10 MG PO TB24: 10.0000 mg | ORAL_TABLET | Freq: Every day | ORAL | 0 refills | Status: AC

## 2016-02-10 MED ORDER — DIAZEPAM 5 MG PO TABS
10.0000 mg | ORAL_TABLET | ORAL | Status: AC
  Administered 2016-02-10: 10 mg via ORAL
  Filled 2016-02-10: qty 2

## 2016-02-10 MED ORDER — SODIUM CHLORIDE 0.9 % IV SOLN
INTRAVENOUS | Status: DC
  Administered 2016-02-10: 11:00:00 via INTRAVENOUS

## 2016-02-10 MED ORDER — TRAMADOL HCL 50 MG PO TABS: 50.0000 mg | ORAL_TABLET | Freq: Four times a day (QID) | ORAL | 0 refills | Status: DC | PRN

## 2016-02-10 NOTE — Discharge Instructions (Signed)
Lithotripsy, Care After °Refer to this sheet in the next few weeks. These instructions provide you with information on caring for yourself after your procedure. Your health care provider may also give you more specific instructions. Your treatment has been planned according to current medical practices, but problems sometimes occur. Call your health care provider if you have any problems or questions after your procedure. °WHAT TO EXPECT AFTER THE PROCEDURE  °· Your urine may have a red tinge for a few days after treatment. Blood loss is usually minimal. °· You may have soreness in the back or flank area. This usually goes away after a few days. The procedure can cause blotches or bruises on the back where the pressure wave enters the skin. These marks usually cause only minimal discomfort and should disappear in a short time. °· Stone fragments should begin to pass within 24 hours of treatment. However, a delayed passage is not unusual. °· You may have pain, discomfort, and feel sick to your stomach (nauseated) when the crushed fragments of stone are passed down the tube from the kidney to the bladder. Stone fragments can pass soon after the procedure and may last for up to 4-8 weeks. °· A small number of patients may have severe pain when stone fragments are not able to pass, which leads to an obstruction. °· If your stone is greater than 1 inch (2.5 cm) in diameter or if you have multiple stones that have a combined diameter greater than 1 inch (2.5 cm), you may require more than one treatment. °· If you had a stent placed prior to your procedure, you may experience some discomfort, especially during urination. You may experience the pain or discomfort in your flank or back, or you may experience a sharp pain or discomfort at the base of your penis or in your lower abdomen. The discomfort usually lasts only a few minutes after urinating. °HOME CARE INSTRUCTIONS  °· Rest at home until you feel your energy  improving. °· Only take over-the-counter or prescription medicines for pain, discomfort, or fever as directed by your health care provider. Depending on the type of lithotripsy, you may need to take antibiotics and anti-inflammatory medicines for a few days. °· Drink enough water and fluids to keep your urine clear or pale yellow. This helps "flush" your kidneys. It helps pass any remaining pieces of stone and prevents stones from coming back. °· Most people can resume daily activities within 1-2 days after standard lithotripsy. It can take longer to recover from laser and percutaneous lithotripsy. °· Strain all urine through the provided strainer. Keep all particulate matter and stones for your health care provider to see. The stone may be as small as a grain of salt. It is very important to use the strainer each and every time you pass your urine. Any stones that are found can be sent to a medical lab for examination. °· Visit your health care provider for a follow-up appointment in a few weeks. Your doctor may remove your stent if you have one. Your health care provider will also check to see whether stone particles still remain. °SEEK MEDICAL CARE IF:  °· Your pain is not relieved by medicine. °· You have a lasting nauseous feeling. °· You feel there is too much blood in the urine. °· You develop persistent problems with frequent or painful urination that does not at least partially improve after 2 days following the procedure. °· You have a congested cough. °· You feel   lightheaded. °· You develop a rash or any other signs that might suggest an allergic problem. °· You develop any reaction or side effects to your medicine(s). °SEEK IMMEDIATE MEDICAL CARE IF:  °· You experience severe back or flank pain or both. °· You see nothing but blood when you urinate. °· You cannot pass any urine at all. °· You have a fever or shaking chills. °· You develop shortness of breath, difficulty breathing, or chest pain. °· You  develop vomiting that will not stop after 6-8 hours. °· You have a fainting episode. °This information is not intended to replace advice given to you by your health care provider. Make sure you discuss any questions you have with your health care provider. °Document Released: 01/18/2007 Document Revised: 09/19/2014 Document Reviewed: 07/14/2012 °Elsevier Interactive Patient Education © 2017 Else[employer] ° °Moderate Conscious Sedation, Adult, Care After °These instructions provide you with information about caring for yourself after your procedure. Your health care provider may also give you more specific instructions. Your treatment has been planned according to current medical practices, but problems sometimes occur. Call your health care provider if you have any problems or questions after your procedure. °What can I expect after the procedure? °After your procedure, it is common: °· To feel sleepy for several hours. °· To feel clumsy and have poor balance for several hours. °· To have poor judgment for several hours. °· To vomit if you eat too soon. °Follow these instructions at home: °For at least 24 hours after the procedure:  °· Do not: °¨ Participate in activities where you could fall or become injured. °¨ Drive. °¨ Use heavy machinery. °¨ Drink alcohol. °¨ Take sleeping pills or medicines that cause drowsiness. °¨ Make important decisions or sign legal documents. °¨ Take care of children on your own. °· Rest. °Eating and drinking °· Follow the diet recommended by your health care provider. °· If you vomit: °¨ Drink water, juice, or soup when you can drink without vomiting. °¨ Make sure you have little or no nausea before eating solid foods. °General instructions °· Have a responsible adult stay with you until you are awake and alert. °· Take over-the-counter and prescription medicines only as told by your health care provider. °· If you smoke, do not smoke without supervision. °· Keep all follow-up visits  as told by your health care provider. This is important. °Contact a health care provider if: °· You keep feeling nauseous or you keep vomiting. °· You feel light-headed. °· You develop a rash. °· You have a fever. °Get help right away if: °· You have trouble breathing. °This information is not intended to replace advice given to you by your health care provider. Make sure you discuss any questions you have with your health care provider. °Document Released: 10/19/2012 Document Revised: 06/03/2015 Document Reviewed: 04/20/2015 °Elsevier Interactive Patient Education © 2017 Elsevier Inc. ° ° °

## 2016-02-10 NOTE — Op Note (Signed)
See [facility] OP note scanned into chart. Also because of the size, density, location and other factors that cannot be anticipated I feel this will likely be a staged procedure. This fact supersedes any indication in the scanned [facility] operative note to the contrary.  

## 2016-02-11 ENCOUNTER — Telehealth: Payer: Self-pay | Admitting: Internal Medicine

## 2016-02-11 NOTE — Telephone Encounter (Signed)
lmomtcb x1 

## 2016-02-12 NOTE — Telephone Encounter (Signed)
Attempted to contact Baldo Ash at [facility]. Received a fast busy signal x3. Will try back.

## 2016-02-13 NOTE — Telephone Encounter (Signed)
Pt is now currently living in a nursing home. He needs a prescription on file with the facility for Zolpidem. His last OV was on 02/06/16 with [facility]. He has a pending OV on 08/06/16 with [facility]. The last time our office refilled Zolpidem was on 08/23/15 #30 with 5 additional refills but this was called into his local pharmacy.  [facility] - please advise if you are okay with [place] giving a new prescription to his nursing home. Thanks.

## 2016-02-13 NOTE — Telephone Encounter (Signed)
Spoke with [name] at [facility]. She is aware that [place] is fine with giving them a prescription for the pt. [name] needs [place] to call [facility] and give them the verbal for this. I called [facility] and spoke with Ronalee Belts. They have the pt's prescription information. Nothing further was needed.

## 2016-02-13 NOTE — Telephone Encounter (Signed)
Charlotte from [facility] is returning phone call. They will be open until 1. Please call before 1 today. [phone]

## 2016-02-13 NOTE — Telephone Encounter (Signed)
Ok

## 2016-02-18 NOTE — Discharge Summary (Signed)
Physician Discharge Summary  Patient ID: [name] MRN: [id number] DOB/AGE: 03/06/1931 83 y.o.  Admit date: 01/25/2016 Discharge date: 01/27/2016  Admission Diagnoses: Ureteral calculus Discharge Diagnoses:  Active Problems:   Ureteral calculus   Discharged Condition: good  Hospital Course: The patient tolerated the procedure well and was transferred to the floor on IV pain meds, IV fluid. On POD#1 foley was removed, pt was started on clear liquid diet and they ambulated in the halls. On POD#2 the patient was transitioned to a regular diet, IVFs were discontinued, and the patient passed flatus. Prior to discharge the pt was tolerating a regular diet, pain was controlled on PO pain meds, they were ambulating without difficulty, and they had normal bowel function.   Consults: None  Significant Diagnostic Studies: none  Treatments: surgery: stent placement  Discharge Exam: Blood pressure (!) 156/96, pulse 74, temperature 98.8 F (37.1 C), temperature source Oral, resp. rate 18, height 5\' 8"  (1.727 m), weight 64.8 kg (142 lb 13.7 oz), SpO2 97 %. General appearance: alert, cooperative and appears stated age Head: Normocephalic, without obvious abnormality, atraumatic Eyes: conjunctivae/corneas clear. PERRL, EOM's intact. Fundi benign. Nose: Nares normal. Septum midline. Mucosa normal. No drainage or sinus tenderness. Resp: clear to auscultation bilaterally Cardio: regular rate and rhythm, S1, S2 normal, no murmur, click, rub or gallop GI: soft, non-tender; bowel sounds normal; no masses,  no organomegaly Extremities: extremities normal, atraumatic, no cyanosis or edema Pulses: 2+ and symmetric  Disposition: 01-Home or Self Care  Discharge Instructions    Discharge patient    Complete by:  As directed    Discharge disposition:  01-Home or Self Care   Discharge patient date:  01/26/2016   Discharge patient    Complete by:  As directed    Discharge disposition:  01-Home or  Self Care   Discharge patient date:  01/27/2016     Allergies as of 01/27/2016      Reactions   Colchicine Anaphylaxis   Hibiclens [chlorhexidine] Hives   Cannot use must use ivory soap   Atorvastatin Other (See Comments)   Joint soreness   Ceftin Diarrhea   Celebrex [celecoxib] Swelling   edema   Codeine Hives   Contrast Media [iodinated Diagnostic Agents] Nausea And Vomiting, Rash   Erythromycin Diarrhea   Ezetimibe Other (See Comments)   Joint soreness   Oxycodone-acetaminophen Hives   Rosuvastatin Other (See Comments)   Joint soreness   Simvastatin Other (See Comments)   Joint soreness   Cefpodoxime Rash   Cefuroxime Axetil Rash   Nitroglycerin Other (See Comments)   lowers blood pressure    Tetanus Toxoid Rash   Vantin Other (See Comments)   Doesn't remember      Medication List    TAKE these medications   aspirin EC 81 MG tablet Take 81 mg by mouth.   benzonatate 200 MG capsule Commonly known as:  TESSALON take 1 capsule by mouth three times a day if needed for cough   carbidopa-levodopa 25-100 MG tablet Commonly known as:  SINEMET IR Take 1 tablet by mouth 3 (three) times daily. Takes 0800,1200,1600   colesevelam 625 MG tablet Commonly known as:  WELCHOL Take 1,250 mg by mouth 3 (three) times daily with meals.   COMBIGAN 0.2-0.5 % ophthalmic solution Generic drug:  brimonidine-timolol Place 1 drop into both eyes every 12 (twelve) hours.   furosemide 20 MG tablet Commonly known as:  LASIX Take 20 mg by mouth every morning.   levothyroxine 200 MCG  tablet Commonly known as:  SYNTHROID, LEVOTHROID Take 200 mcg by mouth daily before breakfast. Total of 251mcg   levothyroxine 75 MCG tablet Commonly known as:  SYNTHROID, LEVOTHROID Take 75 mcg by mouth daily before breakfast.   mirabegron ER 50 MG Tb24 tablet Commonly known as:  MYRBETRIQ Take 1 tablet (50 mg total) by mouth daily.   multivitamin-lutein Caps capsule Take 1 capsule by mouth  daily.   omeprazole 20 MG capsule Commonly known as:  PRILOSEC Take 20 mg by mouth daily.   polyethylene glycol packet Commonly known as:  MIRALAX / GLYCOLAX Take 17 g by mouth daily.   sitaGLIPtin 50 MG tablet Commonly known as:  JANUVIA Take 1 tablet (50 mg total) by mouth daily.   traMADol 50 MG tablet Commonly known as:  ULTRAM Take 1 tablet (50 mg total) by mouth every 6 (six) hours as needed.   URIBEL 118 MG Caps Take 1 capsule (118 mg total) by mouth 3 (three) times daily as needed (burning).      Follow-up Information    Karen Kays, NP. Call in 1 week.   Specialty:  Nurse Practitioner Why:  schedule second surgery Contact information: [address] [place] [address] [phone]           Signed: Nicolette Bang 02/18/2016, 6:43 AM

## 2016-02-19 DIAGNOSIS — J069 Acute upper respiratory infection, unspecified: Secondary | ICD-10-CM | POA: Diagnosis not present

## 2016-02-19 DIAGNOSIS — J020 Streptococcal pharyngitis: Secondary | ICD-10-CM | POA: Diagnosis not present

## 2016-02-24 ENCOUNTER — Other Ambulatory Visit: Payer: Self-pay | Admitting: Urology

## 2016-02-24 DIAGNOSIS — N201 Calculus of ureter: Secondary | ICD-10-CM | POA: Diagnosis not present

## 2016-02-25 ENCOUNTER — Encounter (HOSPITAL_COMMUNITY): Payer: Self-pay | Admitting: *Deleted

## 2016-02-27 ENCOUNTER — Ambulatory Visit (HOSPITAL_COMMUNITY): Payer: Medicare Other

## 2016-02-27 ENCOUNTER — Ambulatory Visit (HOSPITAL_COMMUNITY)
Admission: RE | Admit: 2016-02-27 | Discharge: 2016-02-27 | Disposition: A | Discharge status: Home / Self Care | Payer: Medicare Other | Source: Ambulatory Visit | Attending: Urology | Admitting: Urology

## 2016-02-27 ENCOUNTER — Encounter (HOSPITAL_COMMUNITY)
Admission: RE | Disposition: A | Discharge status: Home / Self Care | Payer: Self-pay | Source: Ambulatory Visit | Attending: Urology

## 2016-02-27 ENCOUNTER — Encounter (HOSPITAL_COMMUNITY): Payer: Self-pay | Admitting: General Practice

## 2016-02-27 DIAGNOSIS — N200 Calculus of kidney: Secondary | ICD-10-CM | POA: Diagnosis not present

## 2016-02-27 DIAGNOSIS — Z8673 Personal history of transient ischemic attack (TIA), and cerebral infarction without residual deficits: Secondary | ICD-10-CM | POA: Insufficient documentation

## 2016-02-27 DIAGNOSIS — E039 Hypothyroidism, unspecified: Secondary | ICD-10-CM | POA: Insufficient documentation

## 2016-02-27 DIAGNOSIS — E118 Type 2 diabetes mellitus with unspecified complications: Secondary | ICD-10-CM | POA: Insufficient documentation

## 2016-02-27 DIAGNOSIS — Z7982 Long term (current) use of aspirin: Secondary | ICD-10-CM | POA: Diagnosis not present

## 2016-02-27 DIAGNOSIS — N400 Enlarged prostate without lower urinary tract symptoms: Secondary | ICD-10-CM | POA: Diagnosis not present

## 2016-02-27 HISTORY — PX: EXTRACORPOREAL SHOCK WAVE LITHOTRIPSY: SHX1557

## 2016-02-27 LAB — GLUCOSE, CAPILLARY: GLUCOSE-CAPILLARY: 113 mg/dL — AB (ref 65–99)

## 2016-02-27 SURGERY — LITHOTRIPSY, ESWL
Anesthesia: LOCAL | Laterality: Right

## 2016-02-27 MED ORDER — DIAZEPAM 5 MG PO TABS
10.0000 mg | ORAL_TABLET | ORAL | Status: AC
  Administered 2016-02-27: 10 mg via ORAL
  Filled 2016-02-27: qty 2

## 2016-02-27 MED ORDER — CIPROFLOXACIN HCL 500 MG PO TABS
500.0000 mg | ORAL_TABLET | ORAL | Status: AC
  Administered 2016-02-27: 500 mg via ORAL
  Filled 2016-02-27: qty 1

## 2016-02-27 MED ORDER — DIPHENHYDRAMINE HCL 25 MG PO CAPS
25.0000 mg | ORAL_CAPSULE | ORAL | Status: AC
  Administered 2016-02-27: 25 mg via ORAL
  Filled 2016-02-27: qty 1

## 2016-02-27 MED ORDER — SODIUM CHLORIDE 0.9 % IV SOLN
INTRAVENOUS | Status: DC
  Administered 2016-02-27: 10:00:00 via INTRAVENOUS

## 2016-02-27 NOTE — Progress Notes (Signed)
Discharge instructions called to Estill Bamberg RN at [facility].

## 2016-02-27 NOTE — Discharge Instructions (Signed)
Lithotripsy, Care After °Refer to this sheet in the next few weeks. These instructions provide you with information on caring for yourself after your procedure. Your health care provider may also give you more specific instructions. Your treatment has been planned according to current medical practices, but problems sometimes occur. Call your health care provider if you have any problems or questions after your procedure. °WHAT TO EXPECT AFTER THE PROCEDURE  °· Your urine may have a red tinge for a few days after treatment. Blood loss is usually minimal. °· You may have soreness in the back or flank area. This usually goes away after a few days. The procedure can cause blotches or bruises on the back where the pressure wave enters the skin. These marks usually cause only minimal discomfort and should disappear in a short time. °· Stone fragments should begin to pass within 24 hours of treatment. However, a delayed passage is not unusual. °· You may have pain, discomfort, and feel sick to your stomach (nauseated) when the crushed fragments of stone are passed down the tube from the kidney to the bladder. Stone fragments can pass soon after the procedure and may last for up to 4-8 weeks. °· A small number of patients may have severe pain when stone fragments are not able to pass, which leads to an obstruction. °· If your stone is greater than 1 inch (2.5 cm) in diameter or if you have multiple stones that have a combined diameter greater than 1 inch (2.5 cm), you may require more than one treatment. °· If you had a stent placed prior to your procedure, you may experience some discomfort, especially during urination. You may experience the pain or discomfort in your flank or back, or you may experience a sharp pain or discomfort at the base of your penis or in your lower abdomen. The discomfort usually lasts only a few minutes after urinating. °HOME CARE INSTRUCTIONS  °· Rest at home until you feel your energy  improving. °· Only take over-the-counter or prescription medicines for pain, discomfort, or fever as directed by your health care provider. Depending on the type of lithotripsy, you may need to take antibiotics and anti-inflammatory medicines for a few days. °· Drink enough water and fluids to keep your urine clear or pale yellow. This helps "flush" your kidneys. It helps pass any remaining pieces of stone and prevents stones from coming back. °· Most people can resume daily activities within 1-2 days after standard lithotripsy. It can take longer to recover from laser and percutaneous lithotripsy. °· Strain all urine through the provided strainer. Keep all particulate matter and stones for your health care provider to see. The stone may be as small as a grain of salt. It is very important to use the strainer each and every time you pass your urine. Any stones that are found can be sent to a medical lab for examination. °· Visit your health care provider for a follow-up appointment in a few weeks. Your doctor may remove your stent if you have one. Your health care provider will also check to see whether stone particles still remain. °SEEK MEDICAL CARE IF:  °· Your pain is not relieved by medicine. °· You have a lasting nauseous feeling. °· You feel there is too much blood in the urine. °· You develop persistent problems with frequent or painful urination that does not at least partially improve after 2 days following the procedure. °· You have a congested cough. °· You feel   lightheaded.  You develop a rash or any other signs that might suggest an allergic problem.  You develop any reaction or side effects to your medicine(s). SEEK IMMEDIATE MEDICAL CARE IF:   You experience severe back or flank pain or both.  You see nothing but blood when you urinate.  You cannot pass any urine at all.  You have a fever or shaking chills.  You develop shortness of breath, difficulty breathing, or chest pain.  You  develop vomiting that will not stop after 6-8 hours.  You have a fainting episode. This information is not intended to replace advice given to you by your health care provider. Make sure you discuss any questions you have with your health care provider. Document Released: 01/18/2007 Document Revised: 09/19/2014 Document Reviewed: 07/14/2012 Elsevier Interactive Patient Education  2017 [employer].

## 2016-03-09 DIAGNOSIS — R600 Localized edema: Secondary | ICD-10-CM | POA: Diagnosis not present

## 2016-03-17 DIAGNOSIS — E119 Type 2 diabetes mellitus without complications: Secondary | ICD-10-CM | POA: Diagnosis not present

## 2016-03-17 DIAGNOSIS — Z961 Presence of intraocular lens: Secondary | ICD-10-CM | POA: Diagnosis not present

## 2016-03-17 DIAGNOSIS — H353231 Exudative age-related macular degeneration, bilateral, with active choroidal neovascularization: Secondary | ICD-10-CM | POA: Diagnosis not present

## 2016-03-18 DIAGNOSIS — N200 Calculus of kidney: Secondary | ICD-10-CM | POA: Diagnosis not present

## 2016-03-18 DIAGNOSIS — R8271 Bacteriuria: Secondary | ICD-10-CM | POA: Diagnosis not present

## 2016-03-19 ENCOUNTER — Telehealth: Payer: Self-pay | Admitting: Internal Medicine

## 2016-03-19 MED ORDER — AZITHROMYCIN 250 MG PO TABS: ORAL_TABLET | ORAL | 0 refills | Status: DC

## 2016-03-19 NOTE — Telephone Encounter (Signed)
Called and spoke with pt and he is aware of rx that has been sent to the pharmacy and to take the mucinex DM.  Nothing further is needed.

## 2016-03-19 NOTE — Telephone Encounter (Signed)
Pt c/o cough with mucus production x 1 month. Little wheezing present.  Pt states that the mucus is dark yellow. Pt states that he feels this is bronchitis and he has had it about 3 weeks now.  Denies fever and SOB. Pt using Tessalon Perles but they do not seem to be working 100% Requesting something be called in to the [place]  Please advise Dr Annamaria Boots. Thanks.   Allergies  Allergen Reactions  . Colchicine Anaphylaxis  . Hibiclens [Chlorhexidine] Hives    Cannot use must use ivory soap  . Atorvastatin Other (See Comments)    Joint soreness  . Ceftin Diarrhea  . Celebrex [Celecoxib] Swelling    edema  . Codeine Hives  . Contrast Media [Iodinated Diagnostic Agents] Nausea And Vomiting and Rash  . Erythromycin Diarrhea  . Ezetimibe Other (See Comments)    Joint soreness  . Oxycodone-Acetaminophen Hives  . Rosuvastatin Other (See Comments)    Joint soreness  . Simvastatin Other (See Comments)    Joint soreness  . Cefpodoxime Rash  . Cefuroxime Axetil Rash  . Nitroglycerin Other (See Comments)    lowers blood pressure   . Tetanus Toxoid Rash  . Vantin Other (See Comments)    Doesn't remember   Allergies as of 03/19/2016      Reactions   Colchicine Anaphylaxis   Hibiclens [chlorhexidine] Hives   Cannot use must use ivory soap   Atorvastatin Other (See Comments)   Joint soreness   Ceftin Diarrhea   Celebrex [celecoxib] Swelling   edema   Codeine Hives   Contrast Media [iodinated Diagnostic Agents] Nausea And Vomiting, Rash   Erythromycin Diarrhea   Ezetimibe Other (See Comments)   Joint soreness   Oxycodone-acetaminophen Hives   Rosuvastatin Other (See Comments)   Joint soreness   Simvastatin Other (See Comments)   Joint soreness   Cefpodoxime Rash   Cefuroxime Axetil Rash   Nitroglycerin Other (See Comments)   lowers blood pressure    Tetanus Toxoid Rash   Vantin Other (See Comments)   Doesn't remember      Medication List        Accurate as of 03/19/16  3:22 PM. Always use your most recent med list.          alfuzosin 10 MG 24 hr tablet Commonly known as:  UROXATRAL Take 1 tablet (10 mg total) by mouth at bedtime.   aspirin EC 81 MG tablet Take 81 mg by mouth.   benzonatate 200 MG capsule Commonly known as:  TESSALON take 1 capsule by mouth three times a day if needed for cough   carbidopa-levodopa 25-100 MG tablet Commonly known as:  SINEMET IR Take 1 tablet by mouth 3 (three) times daily. Takes 0800,1200,1600   CARTIA XT 240 MG 24 hr capsule Generic drug:  diltiazem Take 240 mg by mouth daily.   colesevelam 625 MG tablet Commonly known as:  WELCHOL Take 1,250 mg by mouth 3 (three) times daily with meals.   COMBIGAN 0.2-0.5 % ophthalmic solution Generic drug:  brimonidine-timolol Place 1 drop into both eyes every 12 (twelve) hours.   furosemide 20 MG tablet Commonly known as:  LASIX Take 20 mg by mouth every morning.   levothyroxine 200 MCG tablet Commonly known as:  SYNTHROID, LEVOTHROID Take 200 mcg by mouth daily before breakfast. Total of 256mcg   levothyroxine 75 MCG tablet Commonly known as:  SYNTHROID, LEVOTHROID Take 75 mcg by mouth daily before breakfast.  mirabegron ER 50 MG Tb24 tablet Commonly known as:  MYRBETRIQ Take 1 tablet (50 mg total) by mouth daily.   multivitamin-lutein Caps capsule Take 1 capsule by mouth daily.   omeprazole 20 MG capsule Commonly known as:  PRILOSEC Take 20 mg by mouth daily.   polyethylene glycol packet Commonly known as:  MIRALAX / GLYCOLAX Take 17 g by mouth daily.   sitaGLIPtin 50 MG tablet Commonly known as:  JANUVIA Take 1 tablet (50 mg total) by mouth daily.   traMADol 50 MG tablet Commonly known as:  ULTRAM Take 1 tablet (50 mg total) by mouth every 6 (six) hours as needed.   traMADol 50 MG tablet Commonly known as:  ULTRAM Take 1 tablet (50 mg total) by mouth every 6 (six) hours as needed.   URIBEL 118 MG Caps Take 1  capsule (118 mg total) by mouth 3 (three) times daily as needed (burning).   Vitamin D3 5000 units Caps Take 5,000 Units by mouth daily.   zolpidem 5 MG tablet Commonly known as:  AMBIEN Take 5 mg by mouth at bedtime as needed for sleep.

## 2016-03-19 NOTE — Telephone Encounter (Signed)
Offer Zpak  250 mg, # 6, 2 today then one daily  Suggest otc Mucinex-DM  For cough and mucus

## 2016-03-20 DIAGNOSIS — N183 Chronic kidney disease, stage 3 (moderate): Secondary | ICD-10-CM | POA: Diagnosis not present

## 2016-03-20 DIAGNOSIS — R05 Cough: Secondary | ICD-10-CM | POA: Diagnosis not present

## 2016-03-20 DIAGNOSIS — I129 Hypertensive chronic kidney disease with stage 1 through stage 4 chronic kidney disease, or unspecified chronic kidney disease: Secondary | ICD-10-CM | POA: Diagnosis not present

## 2016-03-20 DIAGNOSIS — R600 Localized edema: Secondary | ICD-10-CM | POA: Diagnosis not present

## 2016-04-07 ENCOUNTER — Encounter (HOSPITAL_COMMUNITY): Payer: Self-pay | Admitting: Urology

## 2016-04-22 ENCOUNTER — Other Ambulatory Visit: Payer: Self-pay | Admitting: Urology

## 2016-04-22 ENCOUNTER — Ambulatory Visit (HOSPITAL_COMMUNITY): Payer: Medicare Other | Admitting: Registered Nurse

## 2016-04-22 ENCOUNTER — Encounter (HOSPITAL_COMMUNITY)
Admission: AD | Disposition: A | Discharge status: Home / Self Care | Payer: Self-pay | Source: Ambulatory Visit | Attending: Urology

## 2016-04-22 ENCOUNTER — Observation Stay (HOSPITAL_COMMUNITY)
Admission: AD | Admit: 2016-04-22 | Discharge: 2016-04-23 | Disposition: A | Discharge status: Home / Self Care | Payer: Medicare Other | Source: Ambulatory Visit | Attending: Urology | Admitting: Urology

## 2016-04-22 ENCOUNTER — Ambulatory Visit (HOSPITAL_COMMUNITY): Payer: Medicare Other

## 2016-04-22 ENCOUNTER — Encounter (HOSPITAL_COMMUNITY): Payer: Self-pay | Admitting: *Deleted

## 2016-04-22 DIAGNOSIS — E039 Hypothyroidism, unspecified: Secondary | ICD-10-CM | POA: Insufficient documentation

## 2016-04-22 DIAGNOSIS — N189 Chronic kidney disease, unspecified: Secondary | ICD-10-CM | POA: Insufficient documentation

## 2016-04-22 DIAGNOSIS — N3289 Other specified disorders of bladder: Secondary | ICD-10-CM | POA: Diagnosis not present

## 2016-04-22 DIAGNOSIS — Z7984 Long term (current) use of oral hypoglycemic drugs: Secondary | ICD-10-CM | POA: Insufficient documentation

## 2016-04-22 DIAGNOSIS — E1122 Type 2 diabetes mellitus with diabetic chronic kidney disease: Secondary | ICD-10-CM | POA: Diagnosis not present

## 2016-04-22 DIAGNOSIS — T83192A Other mechanical complication of urinary stent, initial encounter: Secondary | ICD-10-CM | POA: Diagnosis not present

## 2016-04-22 DIAGNOSIS — I129 Hypertensive chronic kidney disease with stage 1 through stage 4 chronic kidney disease, or unspecified chronic kidney disease: Secondary | ICD-10-CM | POA: Diagnosis not present

## 2016-04-22 DIAGNOSIS — Z87442 Personal history of urinary calculi: Secondary | ICD-10-CM | POA: Insufficient documentation

## 2016-04-22 DIAGNOSIS — Z85828 Personal history of other malignant neoplasm of skin: Secondary | ICD-10-CM | POA: Diagnosis not present

## 2016-04-22 DIAGNOSIS — H3530 Unspecified macular degeneration: Secondary | ICD-10-CM | POA: Diagnosis not present

## 2016-04-22 DIAGNOSIS — Z960 Presence of urogenital implants: Secondary | ICD-10-CM

## 2016-04-22 DIAGNOSIS — Z86718 Personal history of other venous thrombosis and embolism: Secondary | ICD-10-CM | POA: Insufficient documentation

## 2016-04-22 DIAGNOSIS — K219 Gastro-esophageal reflux disease without esophagitis: Secondary | ICD-10-CM | POA: Insufficient documentation

## 2016-04-22 DIAGNOSIS — Z79899 Other long term (current) drug therapy: Secondary | ICD-10-CM | POA: Diagnosis not present

## 2016-04-22 DIAGNOSIS — Z466 Encounter for fitting and adjustment of urinary device: Secondary | ICD-10-CM | POA: Diagnosis not present

## 2016-04-22 DIAGNOSIS — T8389XA Other specified complication of genitourinary prosthetic devices, implants and grafts, initial encounter: Secondary | ICD-10-CM | POA: Diagnosis not present

## 2016-04-22 DIAGNOSIS — Z8673 Personal history of transient ischemic attack (TIA), and cerebral infarction without residual deficits: Secondary | ICD-10-CM | POA: Diagnosis not present

## 2016-04-22 DIAGNOSIS — Z7982 Long term (current) use of aspirin: Secondary | ICD-10-CM | POA: Diagnosis not present

## 2016-04-22 DIAGNOSIS — E1151 Type 2 diabetes mellitus with diabetic peripheral angiopathy without gangrene: Secondary | ICD-10-CM | POA: Insufficient documentation

## 2016-04-22 DIAGNOSIS — Z8546 Personal history of malignant neoplasm of prostate: Secondary | ICD-10-CM | POA: Diagnosis not present

## 2016-04-22 DIAGNOSIS — N3001 Acute cystitis with hematuria: Secondary | ICD-10-CM | POA: Diagnosis not present

## 2016-04-22 DIAGNOSIS — Y831 Surgical operation with implant of artificial internal device as the cause of abnormal reaction of the patient, or of later complication, without mention of misadventure at the time of the procedure: Secondary | ICD-10-CM | POA: Diagnosis not present

## 2016-04-22 DIAGNOSIS — Z96653 Presence of artificial knee joint, bilateral: Secondary | ICD-10-CM | POA: Insufficient documentation

## 2016-04-22 DIAGNOSIS — R252 Cramp and spasm: Secondary | ICD-10-CM | POA: Diagnosis not present

## 2016-04-22 DIAGNOSIS — N201 Calculus of ureter: Secondary | ICD-10-CM | POA: Diagnosis not present

## 2016-04-22 HISTORY — PX: CYSTOSCOPY/RETROGRADE/URETEROSCOPY: SHX5316

## 2016-04-22 LAB — BASIC METABOLIC PANEL
Anion gap: 8 (ref 5–15)
BUN: 33 mg/dL — AB (ref 6–20)
CHLORIDE: 106 mmol/L (ref 101–111)
CO2: 24 mmol/L (ref 22–32)
Calcium: 8.7 mg/dL — ABNORMAL LOW (ref 8.9–10.3)
Creatinine, Ser: 1.57 mg/dL — ABNORMAL HIGH (ref 0.61–1.24)
GFR calc Af Amer: 46 mL/min — ABNORMAL LOW (ref >60)
GFR calc non Af Amer: 39 mL/min — ABNORMAL LOW (ref >60)
GLUCOSE: 105 mg/dL — AB (ref 65–99)
POTASSIUM: 4.2 mmol/L (ref 3.5–5.1)
Sodium: 138 mmol/L (ref 135–145)

## 2016-04-22 LAB — GLUCOSE, CAPILLARY
Glucose-Capillary: 107 mg/dL — ABNORMAL HIGH (ref 65–99)
Glucose-Capillary: 89 mg/dL (ref 65–99)

## 2016-04-22 SURGERY — CYSTOSCOPY/RETROGRADE/URETEROSCOPY
Anesthesia: General | Site: Ureter | Laterality: Right

## 2016-04-22 MED ORDER — POLYETHYLENE GLYCOL 3350 17 G PO PACK
17.0000 g | PACK | Freq: Every day | ORAL | Status: DC
  Administered 2016-04-23: 17 g via ORAL
  Filled 2016-04-22: qty 1

## 2016-04-22 MED ORDER — OXYBUTYNIN CHLORIDE 5 MG PO TABS
5.0000 mg | ORAL_TABLET | Freq: Three times a day (TID) | ORAL | Status: DC | PRN
  Administered 2016-04-23: 5 mg via ORAL
  Filled 2016-04-22: qty 1

## 2016-04-22 MED ORDER — LEVOTHYROXINE SODIUM 75 MCG PO TABS: 75.0000 ug | ORAL_TABLET | Freq: Every day | ORAL | Status: DC

## 2016-04-22 MED ORDER — PANTOPRAZOLE SODIUM 40 MG PO TBEC
40.0000 mg | DELAYED_RELEASE_TABLET | Freq: Every day | ORAL | Status: DC
  Administered 2016-04-23: 40 mg via ORAL
  Filled 2016-04-22: qty 1

## 2016-04-22 MED ORDER — COLESEVELAM HCL 625 MG PO TABS
1250.0000 mg | ORAL_TABLET | Freq: Three times a day (TID) | ORAL | Status: DC
  Administered 2016-04-23 (×2): 1250 mg via ORAL
  Filled 2016-04-22 (×3): qty 2

## 2016-04-22 MED ORDER — TIMOLOL MALEATE 0.5 % OP SOLN
1.0000 [drp] | Freq: Two times a day (BID) | OPHTHALMIC | Status: DC
  Administered 2016-04-22 – 2016-04-23 (×2): 1 [drp] via OPHTHALMIC
  Filled 2016-04-22: qty 5

## 2016-04-22 MED ORDER — SODIUM CHLORIDE 0.45 % IV SOLN
INTRAVENOUS | Status: DC
  Administered 2016-04-22: 23:00:00 via INTRAVENOUS

## 2016-04-22 MED ORDER — DILTIAZEM HCL ER COATED BEADS 240 MG PO CP24
240.0000 mg | ORAL_CAPSULE | Freq: Every day | ORAL | Status: DC
  Administered 2016-04-23: 240 mg via ORAL
  Filled 2016-04-22: qty 1

## 2016-04-22 MED ORDER — SODIUM CHLORIDE 0.9 % IR SOLN
Status: DC | PRN
  Administered 2016-04-22: 4000 mL

## 2016-04-22 MED ORDER — LACTATED RINGERS IV SOLN
INTRAVENOUS | Status: DC
  Administered 2016-04-22: 18:00:00 via INTRAVENOUS

## 2016-04-22 MED ORDER — CARBIDOPA-LEVODOPA 25-100 MG PO TABS
1.0000 | ORAL_TABLET | ORAL | Status: DC
  Administered 2016-04-23 (×2): 1 via ORAL
  Filled 2016-04-22 (×2): qty 1

## 2016-04-22 MED ORDER — CIPROFLOXACIN IN D5W 200 MG/100ML IV SOLN
200.0000 mg | Freq: Two times a day (BID) | INTRAVENOUS | Status: DC
  Administered 2016-04-22 – 2016-04-23 (×2): 200 mg via INTRAVENOUS
  Filled 2016-04-22 (×3): qty 100

## 2016-04-22 MED ORDER — FENTANYL CITRATE (PF) 100 MCG/2ML IJ SOLN
25.0000 ug | INTRAMUSCULAR | Status: AC
  Administered 2016-04-22: 25 ug via INTRAVENOUS
  Filled 2016-04-22: qty 2

## 2016-04-22 MED ORDER — ONDANSETRON HCL 4 MG/2ML IJ SOLN
INTRAMUSCULAR | Status: DC | PRN
  Administered 2016-04-22: 4 mg via INTRAVENOUS

## 2016-04-22 MED ORDER — FENTANYL CITRATE (PF) 100 MCG/2ML IJ SOLN
INTRAMUSCULAR | Status: DC | PRN
  Administered 2016-04-22: 50 ug via INTRAVENOUS
  Administered 2016-04-22 (×2): 25 ug via INTRAVENOUS

## 2016-04-22 MED ORDER — LEVOTHYROXINE SODIUM 100 MCG PO TABS: 200.0000 ug | ORAL_TABLET | Freq: Every day | ORAL | Status: DC

## 2016-04-22 MED ORDER — LIDOCAINE 2% (20 MG/ML) 5 ML SYRINGE
INTRAMUSCULAR | Status: AC
  Filled 2016-04-22: qty 5

## 2016-04-22 MED ORDER — DEXAMETHASONE SODIUM PHOSPHATE 10 MG/ML IJ SOLN
INTRAMUSCULAR | Status: DC | PRN
  Administered 2016-04-22: 10 mg via INTRAVENOUS

## 2016-04-22 MED ORDER — DEXAMETHASONE SODIUM PHOSPHATE 10 MG/ML IJ SOLN
INTRAMUSCULAR | Status: AC
  Filled 2016-04-22: qty 1

## 2016-04-22 MED ORDER — FENTANYL CITRATE (PF) 100 MCG/2ML IJ SOLN
INTRAMUSCULAR | Status: AC
  Filled 2016-04-22: qty 2

## 2016-04-22 MED ORDER — GENTAMICIN SULFATE 40 MG/ML IJ SOLN
320.0000 mg | INTRAVENOUS | Status: AC
  Administered 2016-04-22: 320 mg via INTRAVENOUS
  Filled 2016-04-22: qty 8

## 2016-04-22 MED ORDER — ZOLPIDEM TARTRATE 5 MG PO TABS: 5.0000 mg | ORAL_TABLET | Freq: Every evening | ORAL | Status: DC | PRN

## 2016-04-22 MED ORDER — ONDANSETRON HCL 4 MG/2ML IJ SOLN
INTRAMUSCULAR | Status: AC
  Filled 2016-04-22: qty 2

## 2016-04-22 MED ORDER — BRIMONIDINE TARTRATE 0.2 % OP SOLN
1.0000 [drp] | Freq: Two times a day (BID) | OPHTHALMIC | Status: DC
  Administered 2016-04-22 – 2016-04-23 (×2): 1 [drp] via OPHTHALMIC
  Filled 2016-04-22: qty 5

## 2016-04-22 MED ORDER — BENZONATATE 100 MG PO CAPS: 200.0000 mg | ORAL_CAPSULE | ORAL | Status: DC | PRN

## 2016-04-22 MED ORDER — PROPOFOL 10 MG/ML IV BOLUS
INTRAVENOUS | Status: DC | PRN
  Administered 2016-04-22: 140 mg via INTRAVENOUS

## 2016-04-22 MED ORDER — BRIMONIDINE TARTRATE-TIMOLOL 0.2-0.5 % OP SOLN: 1.0000 [drp] | Freq: Two times a day (BID) | OPHTHALMIC | Status: DC

## 2016-04-22 MED ORDER — FUROSEMIDE 20 MG PO TABS
20.0000 mg | ORAL_TABLET | Freq: Every morning | ORAL | Status: DC
  Administered 2016-04-23: 20 mg via ORAL
  Filled 2016-04-22: qty 1

## 2016-04-22 MED ORDER — FENTANYL CITRATE (PF) 100 MCG/2ML IJ SOLN: 25.0000 ug | INTRAMUSCULAR | Status: DC | PRN

## 2016-04-22 MED ORDER — LEVOTHYROXINE SODIUM 75 MCG PO TABS
275.0000 ug | ORAL_TABLET | Freq: Every day | ORAL | Status: DC
  Administered 2016-04-23: 09:00:00 275 ug via ORAL
  Filled 2016-04-22: qty 2

## 2016-04-22 MED ORDER — ALFUZOSIN HCL ER 10 MG PO TB24
10.0000 mg | ORAL_TABLET | Freq: Every day | ORAL | Status: DC
  Administered 2016-04-22: 10 mg via ORAL
  Filled 2016-04-22: qty 1

## 2016-04-22 MED ORDER — 0.9 % SODIUM CHLORIDE (POUR BTL) OPTIME
TOPICAL | Status: DC | PRN
  Administered 2016-04-22: 1000 mL

## 2016-04-22 MED ORDER — PROPOFOL 10 MG/ML IV BOLUS
INTRAVENOUS | Status: AC
  Filled 2016-04-22: qty 20

## 2016-04-22 MED ORDER — LIDOCAINE 2% (20 MG/ML) 5 ML SYRINGE
INTRAMUSCULAR | Status: DC | PRN
  Administered 2016-04-22: 60 mg via INTRAVENOUS

## 2016-04-22 MED ORDER — MIRABEGRON ER 50 MG PO TB24
50.0000 mg | ORAL_TABLET | Freq: Every day | ORAL | Status: DC
  Administered 2016-04-23: 50 mg via ORAL
  Filled 2016-04-22: qty 1

## 2016-04-22 SURGICAL SUPPLY — 26 items
BAG URINE DRAINAGE (UROLOGICAL SUPPLIES) ×1 IMPLANT
BAG URO CATCHER STRL LF (MISCELLANEOUS) ×2 IMPLANT
BASKET LASER NITINOL 1.9FR (BASKET) ×1 IMPLANT
BASKET ZERO TIP NITINOL 2.4FR (BASKET) IMPLANT
BSKT STON RTRVL 120 1.9FR (BASKET)
BSKT STON RTRVL ZERO TP 2.4FR (BASKET)
CATH FOLEY 2WAY SLVR  5CC 16FR (CATHETERS) ×1
CATH FOLEY 2WAY SLVR 5CC 16FR (CATHETERS) IMPLANT
CATH INTERMIT  6FR 70CM (CATHETERS) IMPLANT
CLOTH BEACON ORANGE TIMEOUT ST (SAFETY) ×2 IMPLANT
COVER SURGICAL LIGHT HANDLE (MISCELLANEOUS) ×2 IMPLANT
FIBER LASER FLEXIVA 365 (UROLOGICAL SUPPLIES) IMPLANT
FIBER LASER TRAC TIP (UROLOGICAL SUPPLIES) IMPLANT
GLOVE BIOGEL M 8.0 STRL (GLOVE) ×2 IMPLANT
GOWN STRL REUS W/ TWL XL LVL3 (GOWN DISPOSABLE) ×1 IMPLANT
GOWN STRL REUS W/TWL LRG LVL3 (GOWN DISPOSABLE) ×4 IMPLANT
GOWN STRL REUS W/TWL XL LVL3 (GOWN DISPOSABLE) ×2
GUIDEWIRE ANG ZIPWIRE 038X150 (WIRE) ×2 IMPLANT
GUIDEWIRE STR DUAL SENSOR (WIRE) ×2 IMPLANT
IV NS 1000ML (IV SOLUTION) ×2
IV NS 1000ML BAXH (IV SOLUTION) ×1 IMPLANT
MANIFOLD NEPTUNE II (INSTRUMENTS) ×2 IMPLANT
PACK CYSTO (CUSTOM PROCEDURE TRAY) ×2 IMPLANT
SHEATH ACCESS URETERAL 24CM (SHEATH) ×1 IMPLANT
STENT URET 6FRX24 CONTOUR (STENTS) ×1 IMPLANT
TUBING CONNECTING 10 (TUBING) ×2 IMPLANT

## 2016-04-22 NOTE — Op Note (Signed)
Preoperative diagnosis: Retained right ureteral stent.  Postoperative diagnosis: Same  Principal procedure: Cystoscopy, right retrograde ureteropyelogram with fluoroscopic interpretation, extraction of right double-J stent, replacement of right double-J stent-24 centimeters 6 [place] contour, with tether  Surgeon: Irini Leet  Anesthesia: Gen.  Complications: None  Drains: 4 French Foley catheter, previously mentioned double-J stent.  Specimen: None.  Estimated blood loss: Less than 5 mL  Indications: 83 year old male presenting to our office today for cystoscopy and stent extraction.  Unfortunately, the patient had a retained stent, that would not be fully extracted, possibly due to calcifications.  The patient presents at this time for cystoscopic management of this.  Findings: Extremely calcified stent, with the distal curl within the ureterovesical junction/distal ureter.  Retrograde ureteropyelogram revealed a widened ureter with some filling defects within the distal ureter consistent with either clots or small calcifications.  There was no extravasation, and the ureter was intact.  Description of procedure: The patient was properly identified and marked in the holding area.  He came to the operating room, and gentamicin was infused as per the preoperative orders by Dr. Gaynelle Arabian.  He was then administered general anesthesia.  He was placed in the dorsolithotomy position.  Genitalia and perineum were prepped and draped.  Proper timeout was performed.  The stent was evident coming from his urethral meatus.  A 21 French cystoscope was advanced under direct vision through his urethra which was normal except for the presence of the stent.  The prostatic urethra was somewhat fixed, secondary to his prior radiation.  The bladder revealed no significant mucosal abnormalities.  The stent was evident protruding from the right ureteral orifice.  Gentle traction with the scope within the bladder,  by hand, failed to dislodge the stent.  I then performed a retrograde ureteropyelogram using a 6 [place] open-ended catheter and Omnipaque.  The previously mentioned findings were noted, with an intact ureter.  I then passed a guidewire through the open-ended catheter, negotiating up the ureter were curl was seen in the upper pole calyceal system.  The open-ended catheter was removed, and I dilated the ureteral orifice with the inner core/12 [place] ureteral access catheter.  Following this, the access catheter/dilator was removed.  With the guidewire left in the ureter, I then used gentle traction to remove the stent.  It came out intact.  There were several encrustations on it.  I then repeated a retrograde pyelogram, this revealed an intact ureter, without extravasation.  Because of the trauma to the distal ureter, I felt it important to replace a stent.  Over the previously mentioned guidewire, I laced a 24 centimeter by 6 French contour double-J stent.  The tether was left on.  Following adequate positioning, the guidewire was removed to deploy the stent.  Good proximal and distal curls were seen.  The tether was left on.  The bladder was drained, the scope was removed.  I placed a 26 French Foley catheter and filled the balloon with 10 mL of water.  The tether was tied in a knot just outside the urethral meatus, trimmed, and then taped to the penis.  The patient was then awakened and taken to the PACU in stable condition.  He tolerated the procedure well.

## 2016-04-22 NOTE — Transfer of Care (Signed)
Immediate Anesthesia Transfer of Care Note  Patient: [name]  Procedure(s) Performed: Procedure(s): CYSTOSCOPY/RETROGRADE/ REMOVAL JJ STENT right insertion stent right insertion of foley (Right)  Patient Location: PACU  Anesthesia Type:General  Level of Consciousness: sedated, patient cooperative and responds to stimulation  Airway & Oxygen Therapy: Patient Spontanous Breathing and Patient connected to face mask oxygen  Post-op Assessment: Report given to RN and Post -op Vital signs reviewed and stable  Post vital signs: Reviewed and stable  Last Vitals:  Vitals:   04/22/16 1934 04/22/16 1937  BP:    Pulse: 63 64  Resp:    Temp:      Last Pain:  Vitals:   04/22/16 1937  TempSrc:   PainSc: 2       Patients Stated Pain Goal: 3 (45/40/98 1191)  Complications: No apparent anesthesia complications

## 2016-04-22 NOTE — Anesthesia Preprocedure Evaluation (Signed)
Anesthesia Evaluation  Patient identified by MRN, date of birth, ID band Patient awake    Reviewed: Allergy & Precautions, NPO status , Patient's Chart, lab work & pertinent test results  Airway Mallampati: II  TM Distance: >3 FB Neck ROM: Full    Dental  (+) Teeth Intact, Dental Advisory Given   Pulmonary asthma ,    breath sounds clear to auscultation       Cardiovascular hypertension, + Peripheral Vascular Disease  + dysrhythmias  Rhythm:Regular Rate:Normal     Neuro/Psych PSYCHIATRIC DISORDERS TIACVA    GI/Hepatic Neg liver ROS, GERD  ,  Endo/Other  diabetes, Type 2, Oral Hypoglycemic AgentsHypothyroidism   Renal/GU CRFRenal disease  negative genitourinary   Musculoskeletal  (+) Arthritis , Osteoarthritis,    Abdominal   Peds negative pediatric ROS (+)  Hematology negative hematology ROS (+)   Anesthesia Other Findings - HLD  Reproductive/Obstetrics negative OB ROS                             Lab Results  Component Value Date   WBC 8.0 01/25/2016   HGB 13.3 01/25/2016   HCT 39.2 01/25/2016   MCV 90.7 01/25/2016   PLT 192 01/25/2016   Lab Results  Component Value Date   CREATININE 1.57 (H) 04/22/2016   BUN 33 (H) 04/22/2016   NA 138 04/22/2016   K 4.2 04/22/2016   CL 106 04/22/2016   CO2 24 04/22/2016   Lab Results  Component Value Date   INR 1.05 10/14/2014   INR 2.14 (H) 05/26/2014   INR 0.89 03/13/2010   EKG: normal sinus rhythm.  Echo (2016) - Left ventricle: The cavity size was normal. Wall thickness was   increased in a pattern of moderate LVH. Systolic function was   normal. The estimated ejection fraction was in the range of 55%   to 60%. Wall motion was normal; there were no regional wall   motion abnormalities.   Anesthesia Physical Anesthesia Plan  ASA: III and emergent  Anesthesia Plan: General   Post-op Pain Management:    Induction:  Intravenous  Airway Management Planned: LMA  Additional Equipment:   Intra-op Plan:   Post-operative Plan: Extubation in OR  Informed Consent: I have reviewed the patients History and Physical, chart, labs and discussed the procedure including the risks, benefits and alternatives for the proposed anesthesia with the patient or authorized representative who has indicated his/her understanding and acceptance.   Dental advisory given  Plan Discussed with: CRNA  Anesthesia Plan Comments:         Anesthesia Quick Evaluation

## 2016-04-22 NOTE — H&P (Signed)
Urology History and Physical Exam  CC: retained stent  HPI: 83 year old male recently treated with stent placement and shockwave lithotripsy for a right ureteral stone.  The patient has had an uncomplicated course.  He was seen today in our office for cystoscopy and stent removal.  Dr. Gaynelle Arabian saw the patient, and it was impossible to remove the stent, as it was lodged in his distal ureter. The patient is taken to the operating room following his office visit for cystoscopy, ureteroscopic stone extraction.  The patient did have pyuria noted on urinalysis in the office.  He has been treated with Rocephin and gentamicin.  PMH: Past Medical History:  Diagnosis Date  . Allergic rhinitis   . Arthritis    arthritis-kyphosis  . Asthmatic bronchitis   . Chronic kidney disease   . DM type 2 (diabetes mellitus, type 2) ([place])   . DVT, lower extremity, proximal, acute ([place])   . Dyslipidemia   . Dysrhythmia    '97 tachycardia- no recent issues  . GERD (gastroesophageal reflux disease)   . Hearing impaired    bilateral hearing aids  . History of kidney stones   . History of skin cancer   . Hypertension   . Hypothyroidism   . Macular degeneration    injections done bilaterally recent - 08-22-13  . Prostate cancer [facility])    Prostate cancer-radiation only,skin cancer-basal cell and last squamous cell right eye  . Stroke ([place])   . Urgency-frequency syndrome   . Urticaria     PSH: Past Surgical History:  Procedure Laterality Date  . BACK SURGERY     lumbar fracture  . cataract surgery Bilateral   . COLONOSCOPY W/ POLYPECTOMY     x2   . CYSTOSCOPY WITH INJECTION N/A 09/11/2013   Procedure: CYSTOSCOPY WITH BOTOX INJECTION INTO THE BLADDER;  Surgeon: Ailene Rud, MD;  Location: [facility];  Service: Urology;  Laterality: N/A;  . CYSTOSCOPY WITH RETROGRADE PYELOGRAM, URETEROSCOPY AND STENT PLACEMENT    . CYSTOSCOPY WITH STENT PLACEMENT Right 01/26/2016   Procedure: CYSTOSCOPY, RIGHT  RETROGRADE WITH RIGHT URETERAL STENT PLACEMENT;  Surgeon: Cleon Gustin, MD;  Location: [facility];  Service: Urology;  Laterality: Right;  . EXTRACORPOREAL SHOCK WAVE LITHOTRIPSY Right 02/27/2016   Procedure: RIGHT EXTRACORPOREAL SHOCK WAVE LITHOTRIPSY (ESWL) GAITED;  Surgeon: Cleon Gustin, MD;  Location: [facility];  Service: Urology;  Laterality: Right;  . EXTRACORPOREAL SHOCK WAVE LITHOTRIPSY Right 02/10/2016   Procedure: EXTRACORPOREAL SHOCK WAVE LITHOTRIPSY (ESWL);  Surgeon: Kathie Rhodes, MD;  Location: [facility];  Service: Urology;  Laterality: Right;  [phone] [id number] [employer]- [id number]   . EYE SURGERY     macular degeneration  . GALLBLADDER SURGERY  2006   no cholecystectomy  . HERNIA REPAIR    . LUNG REMOVAL, PARTIAL  age 77   for bronchiectasis  . PARTIAL THYMECTOMY  1985   for nodules  . POPLITEAL SYNOVIAL CYST EXCISION  2005  . TOTAL KNEE ARTHROPLASTY  2008   right  . TOTAL KNEE ARTHROPLASTY  2010   left    Allergies: Allergies  Allergen Reactions  . Colchicine Anaphylaxis  . Hibiclens [Chlorhexidine] Hives    Cannot use must use ivory soap  . Atorvastatin Other (See Comments)    Joint soreness  . Ceftin Diarrhea  . Celebrex [Celecoxib] Swelling    edema  . Codeine Hives  . Contrast Media [Iodinated Diagnostic Agents] Nausea And Vomiting and Rash  . Erythromycin Diarrhea  .  Ezetimibe Other (See Comments)    Joint soreness  . Oxycodone-Acetaminophen Hives  . Rosuvastatin Other (See Comments)    Joint soreness  . Simvastatin Other (See Comments)    Joint soreness  . Cefpodoxime Rash  . Cefuroxime Axetil Rash  . Nitroglycerin Other (See Comments)    lowers blood pressure   . Tetanus Toxoid Rash  . Vantin Other (See Comments)    Doesn't remember    Medications: Prescriptions Prior to Admission  Medication Sig Dispense Refill Last Dose  . colesevelam (WELCHOL) 625 MG tablet Take 1,250 mg by mouth 3 (three) times daily  with meals.    04/22/2016 at Unknown time  . levothyroxine (SYNTHROID, LEVOTHROID) 200 MCG tablet Take 200 mcg by mouth daily before breakfast. Total of 280mcg   04/22/2016 at Unknown time  . levothyroxine (SYNTHROID, LEVOTHROID) 75 MCG tablet Take 75 mcg by mouth daily before breakfast.   04/22/2016 at Unknown time  . alfuzosin (UROXATRAL) 10 MG 24 hr tablet Take 1 tablet (10 mg total) by mouth at bedtime. 30 tablet 0 02/26/2016 at 2000  . aspirin EC 81 MG tablet Take 81 mg by mouth.   02/24/2016 at 0800  . azithromycin (ZITHROMAX) 250 MG tablet Take 2 tablets today then 1 daily until gone 6 tablet 0   . benzonatate (TESSALON) 200 MG capsule take 1 capsule by mouth three times a day if needed for cough 30 capsule 1 Past Month at Unknown time  . carbidopa-levodopa (SINEMET IR) 25-100 MG tablet Take 1 tablet by mouth 3 (three) times daily. Takes 1696,7893,8101   02/26/2016 at 2100  . Cholecalciferol (VITAMIN D3) 5000 units CAPS Take 5,000 Units by mouth daily.   02/26/2016 at 0800  . COMBIGAN 0.2-0.5 % ophthalmic solution Place 1 drop into both eyes every 12 (twelve) hours.   0 02/26/2016 at 1200  . diltiazem (CARTIA XT) 240 MG 24 hr capsule Take 240 mg by mouth daily.   02/26/2016 at 0800  . furosemide (LASIX) 20 MG tablet Take 20 mg by mouth every morning.    02/26/2016 at 0800  . Meth-Hyo-M Bl-Na Phos-Ph Sal (URIBEL) 118 MG CAPS Take 1 capsule (118 mg total) by mouth 3 (three) times daily as needed (burning). 30 capsule 0 02/26/2016 at 2100  . mirabegron ER (MYRBETRIQ) 50 MG TB24 tablet Take 1 tablet (50 mg total) by mouth daily. 30 tablet 0 02/26/2016 at 0800  . multivitamin-lutein (OCUVITE-LUTEIN) CAPS capsule Take 1 capsule by mouth daily.   02/26/2016 at 0800  . omeprazole (PRILOSEC) 20 MG capsule Take 20 mg by mouth daily.   02/26/2016 at 0800  . polyethylene glycol (MIRALAX / GLYCOLAX) packet Take 17 g by mouth daily.   02/26/2016 at 0800  . sitaGLIPtin (JANUVIA) 50 MG tablet Take 1 tablet (50 mg total) by  mouth daily. 30 tablet 1 02/26/2016 at 0800  . traMADol (ULTRAM) 50 MG tablet Take 1 tablet (50 mg total) by mouth every 6 (six) hours as needed. (Patient taking differently: Take 50 mg by mouth every 6 (six) hours as needed for moderate pain. ) 30 tablet 0 Past Month at Unknown time  . traMADol (ULTRAM) 50 MG tablet Take 1 tablet (50 mg total) by mouth every 6 (six) hours as needed. 30 tablet 0 Past Month at Unknown time  . zolpidem (AMBIEN) 5 MG tablet Take 5 mg by mouth at bedtime as needed for sleep.  0 Past Week at Unknown time     Social History: Social History  Social History  . Marital status: Married    Spouse name: N/A  . Number of children: 2  . Years of education: N/A   Occupational History  . retired    Social History Main Topics  . Smoking status: Never Smoker  . Smokeless tobacco: Never Used  . Alcohol use No  . Drug use: No  . Sexual activity: Not Currently   Other Topics Concern  . Not on file   Social History Narrative   MUST HAVE MEDS LISTED DO NOT SUBSTITUTE GENERICS PATIENT IS ALLERGIC TO DYES    Family History: Family History  Problem Relation Age of Onset  . Other Sister     ophth disease  . Heart attack Father     x7  . Heart disease Father     MI x7  . Hypertension Mother   . Stroke Mother 36    Review of Systems: Positive: urinary leakage from retained stent Negative:  A further 10 point review of systems was negative except what is listed in the HPI.                  Physical Exam: @VITALS2 @ General: No acute distress.  Awake. Head:  Normocephalic.  Atraumatic. ENT:  EOMI.  Mucous membranes moist Neck:  Supple.  No lymphadenopathy. CV:  S1 present. S2 present. Regular rate. Pulmonary: Equal effort bilaterally.  Clear to auscultation bilaterally. Abdomen: Soft.   Non- tender to palpation. Skin:  Normal turgor.  No visible rash. Extremity: No gross deformity of bilateral upper extremities.  No gross deformity of                              lower extremities. Neurologic: Alert. Appropriate mood.    Studies:  No results for input(s): HGB, WBC, PLT in the last 72 hours.  No results for input(s): NA, K, CL, CO2, BUN, CREATININE, CALCIUM, GFRNONAA, GFRAA in the last 72 hours.  Invalid input(s): MAGNESIUM   No results for input(s): INR, APTT in the last 72 hours.  Invalid input(s): PT   Invalid input(s): ABG    Assessment:  History of ureteral stone, status post stenting and lithotripsy.  He has a retained stent.  Plan: Cystoscopy, possible ureteroscopy, stent extraction

## 2016-04-22 NOTE — Progress Notes (Signed)
Called Patient's wife at home to inform her that patient is to have surgery. She states [facility] called her about this. Patient and wife talked on phone.

## 2016-04-22 NOTE — Anesthesia Procedure Notes (Signed)
Procedure Name: LMA Insertion Date/Time: 04/22/2016 8:08 PM Performed by: Talbot Grumbling Pre-anesthesia Checklist: Patient identified, Emergency Drugs available, Suction available and Patient being monitored Patient Re-evaluated:Patient Re-evaluated prior to inductionOxygen Delivery Method: Circle system utilized Preoxygenation: Pre-oxygenation with 100% oxygen Intubation Type: IV induction Ventilation: Mask ventilation without difficulty LMA: LMA inserted LMA Size: 4.0 Number of attempts: 1 Placement Confirmation: positive ETCO2 and breath sounds checked- equal and bilateral Tube secured with: Tape Dental Injury: Teeth and Oropharynx as per pre-operative assessment

## 2016-04-22 NOTE — Anesthesia Postprocedure Evaluation (Addendum)
Anesthesia Post Note  Patient: [name]  Procedure(s) Performed: Procedure(s) (LRB): CYSTOSCOPY/RETROGRADE/ REMOVAL JJ STENT right insertion stent right insertion of foley (Right)  Patient location during evaluation: PACU Anesthesia Type: General Level of consciousness: awake and alert Pain management: pain level controlled Vital Signs Assessment: post-procedure vital signs reviewed and stable Respiratory status: spontaneous breathing, nonlabored ventilation, respiratory function stable and patient connected to nasal cannula oxygen Cardiovascular status: blood pressure returned to baseline and stable Postop Assessment: no signs of nausea or vomiting Anesthetic complications: no       Last Vitals:  Vitals:   04/22/16 2115 04/22/16 2130  BP: (!) 149/79 (!) 152/82  Pulse: 64 66  Resp: 14 14  Temp:  36.6 C    Last Pain:  Vitals:   04/22/16 1937  TempSrc:   PainSc: 2                  Effie Berkshire

## 2016-04-23 ENCOUNTER — Emergency Department (HOSPITAL_COMMUNITY): Payer: Medicare Other

## 2016-04-23 ENCOUNTER — Emergency Department (HOSPITAL_COMMUNITY)
Admission: EM | Admit: 2016-04-23 | Discharge: 2016-04-24 | Disposition: A | Discharge status: Home / Self Care | Payer: Medicare Other | Source: Home / Self Care | Attending: Emergency Medicine | Admitting: Emergency Medicine

## 2016-04-23 ENCOUNTER — Encounter (HOSPITAL_COMMUNITY): Payer: Self-pay | Admitting: Urology

## 2016-04-23 DIAGNOSIS — I129 Hypertensive chronic kidney disease with stage 1 through stage 4 chronic kidney disease, or unspecified chronic kidney disease: Secondary | ICD-10-CM | POA: Insufficient documentation

## 2016-04-23 DIAGNOSIS — N289 Disorder of kidney and ureter, unspecified: Secondary | ICD-10-CM | POA: Diagnosis not present

## 2016-04-23 DIAGNOSIS — T8389XA Other specified complication of genitourinary prosthetic devices, implants and grafts, initial encounter: Secondary | ICD-10-CM | POA: Diagnosis not present

## 2016-04-23 DIAGNOSIS — E1122 Type 2 diabetes mellitus with diabetic chronic kidney disease: Secondary | ICD-10-CM

## 2016-04-23 DIAGNOSIS — N3289 Other specified disorders of bladder: Secondary | ICD-10-CM

## 2016-04-23 DIAGNOSIS — Z8673 Personal history of transient ischemic attack (TIA), and cerebral infarction without residual deficits: Secondary | ICD-10-CM

## 2016-04-23 DIAGNOSIS — Z96653 Presence of artificial knee joint, bilateral: Secondary | ICD-10-CM | POA: Insufficient documentation

## 2016-04-23 DIAGNOSIS — R32 Unspecified urinary incontinence: Secondary | ICD-10-CM | POA: Diagnosis not present

## 2016-04-23 DIAGNOSIS — Z7982 Long term (current) use of aspirin: Secondary | ICD-10-CM | POA: Insufficient documentation

## 2016-04-23 DIAGNOSIS — Z85828 Personal history of other malignant neoplasm of skin: Secondary | ICD-10-CM | POA: Insufficient documentation

## 2016-04-23 DIAGNOSIS — N189 Chronic kidney disease, unspecified: Secondary | ICD-10-CM

## 2016-04-23 DIAGNOSIS — Z79899 Other long term (current) drug therapy: Secondary | ICD-10-CM | POA: Insufficient documentation

## 2016-04-23 DIAGNOSIS — R252 Cramp and spasm: Secondary | ICD-10-CM | POA: Diagnosis not present

## 2016-04-23 LAB — BASIC METABOLIC PANEL
ANION GAP: 9 (ref 5–15)
BUN: 30 mg/dL — ABNORMAL HIGH (ref 6–20)
CALCIUM: 8.6 mg/dL — AB (ref 8.9–10.3)
CO2: 23 mmol/L (ref 22–32)
Chloride: 105 mmol/L (ref 101–111)
Creatinine, Ser: 1.47 mg/dL — ABNORMAL HIGH (ref 0.61–1.24)
GFR, EST AFRICAN AMERICAN: 49 mL/min — AB (ref >60)
GFR, EST NON AFRICAN AMERICAN: 43 mL/min — AB (ref >60)
GLUCOSE: 225 mg/dL — AB (ref 65–99)
Potassium: 4.3 mmol/L (ref 3.5–5.1)
SODIUM: 137 mmol/L (ref 135–145)

## 2016-04-23 LAB — CBC WITH DIFFERENTIAL/PLATELET
BASOS PCT: 0 %
Basophils Absolute: 0 10*3/uL (ref 0.0–0.1)
EOS ABS: 0 10*3/uL (ref 0.0–0.7)
Eosinophils Relative: 0 %
HCT: 30 % — ABNORMAL LOW (ref 39.0–52.0)
HEMOGLOBIN: 9.9 g/dL — AB (ref 13.0–17.0)
Lymphocytes Relative: 10 %
Lymphs Abs: 1 10*3/uL (ref 0.7–4.0)
MCH: 30.3 pg (ref 26.0–34.0)
MCHC: 33 g/dL (ref 30.0–36.0)
MCV: 91.7 fL (ref 78.0–100.0)
MONOS PCT: 8 %
Monocytes Absolute: 0.8 10*3/uL (ref 0.1–1.0)
NEUTROS PCT: 82 %
Neutro Abs: 7.5 10*3/uL (ref 1.7–7.7)
Platelets: 196 10*3/uL (ref 150–400)
RBC: 3.27 MIL/uL — ABNORMAL LOW (ref 4.22–5.81)
RDW: 13.1 % (ref 11.5–15.5)
WBC: 9.2 10*3/uL (ref 4.0–10.5)

## 2016-04-23 LAB — MRSA PCR SCREENING: MRSA BY PCR: NEGATIVE

## 2016-04-23 MED ORDER — ACETAMINOPHEN 325 MG PO TABS
650.0000 mg | ORAL_TABLET | ORAL | Status: DC | PRN
  Administered 2016-04-23: 650 mg via ORAL
  Filled 2016-04-23: qty 2

## 2016-04-23 MED ORDER — CIPROFLOXACIN HCL 250 MG PO TABS: 250.0000 mg | ORAL_TABLET | Freq: Two times a day (BID) | ORAL | 0 refills | Status: DC

## 2016-04-23 NOTE — ED Notes (Signed)
Bladder scanned with<32ml volume noted, pt. Denied pain .

## 2016-04-23 NOTE — ED Provider Notes (Signed)
[facility] DEPT Provider Note   CSN: [id number] Arrival date & time: 04/23/16  2139 By signing my name below, I, Dyke Brackett, attest that this documentation has been prepared under the direction and in the presence of Sherwood Gambler, MD . Electronically Signed: Dyke Brackett, Scribe. 04/24/2016. 12:05 AM.   History   Chief Complaint Chief Complaint  Patient presents with  . Urinary Frequency   HPI [name] is a 83 y.o. male who presents to the Emergency Department complaining of increased urinary frequency onset a few hours ago. Pt states he feels "spasms" and the urge to urinate, but cannot make it to the restroom in time. He reports associated dysuria and penile pain. Pt was diagnosed with a kidney stone on 02/27/16. Per chart review, he had a stent excised and another stent placed on 04/22/16, and was d/c home today. Per pt, he was placed on ciprofloxacin today, but he is not going to take it because "it's too dangerous" to take with his other medications. He denies any fever, flank pain, nausea, or vomiting. Pt has no other acute complaints or associated symptoms at this time.     The history is provided by the patient and medical records. No language interpreter was used.    Past Medical History:  Diagnosis Date  . Allergic rhinitis   . Arthritis    arthritis-kyphosis  . Asthmatic bronchitis   . Chronic kidney disease   . DM type 2 (diabetes mellitus, type 2) ([place])   . DVT, lower extremity, proximal, acute ([place])   . Dyslipidemia   . Dysrhythmia    '97 tachycardia- no recent issues  . GERD (gastroesophageal reflux disease)   . Hearing impaired    bilateral hearing aids  . History of kidney stones   . History of skin cancer   . Hypertension   . Hypothyroidism   . Macular degeneration    injections done bilaterally recent - 08-22-13  . Prostate cancer [facility])    Prostate cancer-radiation only,skin cancer-basal cell and last squamous cell right eye  . Stroke ([place])    . Urgency-frequency syndrome   . Urticaria     Patient Active Problem List   Diagnosis Date Noted  . Retained ureteral stent 04/22/2016  . Ureteral calculus 01/25/2016  . Mental status change 10/24/2015  . Encephalopathy 10/24/2015  . Hypothermia 10/24/2015  . Chronic renal insufficiency 10/24/2015  . Benign essential HTN 10/24/2015  . Hypoglycemia 10/24/2015  . Acute encephalopathy 10/24/2015  . CVA (cerebral vascular accident) ([place]) 10/14/2014  . Prerenal azotemia 10/14/2014  . Mild HTN 10/14/2014  . CVA (cerebral infarction)   . TIA (transient ischemic attack)   . PNAR (perennial non-allergic rhinitis) 05/23/2013  . Seasonal and perennial allergic rhinitis 06/11/2012  . Acute bronchitis due to infection 01/14/2011  . Insomnia, unspecified 05/19/2010  . ATAXIA 03/17/2010  . DIZZINESS 03/06/2010  . Chronic constipation 02/21/2008  . OSTEOARTHRITIS, KNEE, SEVERE 01/12/2008  . HYPERLIPIDEMIA 01/17/2007  . Hypothyroidism (post surgical) 10/15/2006  . SKIN CANCER, HX OF 06/08/2006  . Diabetes mellitus, insulin dependent (IDDM), controlled ([place]) 04/22/2006  . URTICARIA 04/22/2006  . POPLITEAL CYST 04/22/2006  . THYROIDECTOMY, SUBTOTAL, HX OF 04/22/2006  . TOTAL KNEE REPLACEMENT, HX OF 04/22/2006    Past Surgical History:  Procedure Laterality Date  . BACK SURGERY     lumbar fracture  . cataract surgery Bilateral   . COLONOSCOPY W/ POLYPECTOMY     x2   . CYSTOSCOPY WITH INJECTION N/A 09/11/2013  Procedure: CYSTOSCOPY WITH BOTOX INJECTION INTO THE BLADDER;  Surgeon: Ailene Rud, MD;  Location: [facility];  Service: Urology;  Laterality: N/A;  . CYSTOSCOPY WITH RETROGRADE PYELOGRAM, URETEROSCOPY AND STENT PLACEMENT    . CYSTOSCOPY WITH STENT PLACEMENT Right 01/26/2016   Procedure: CYSTOSCOPY, RIGHT RETROGRADE WITH RIGHT URETERAL STENT PLACEMENT;  Surgeon: Cleon Gustin, MD;  Location: [facility];  Service: Urology;  Laterality: Right;  .  CYSTOSCOPY/RETROGRADE/URETEROSCOPY Right 04/22/2016   Procedure: CYSTOSCOPY/RETROGRADE/ REMOVAL JJ STENT right insertion stent right insertion of foley;  Surgeon: Franchot Gallo, MD;  Location: [facility];  Service: Urology;  Laterality: Right;  . EXTRACORPOREAL SHOCK WAVE LITHOTRIPSY Right 02/27/2016   Procedure: RIGHT EXTRACORPOREAL SHOCK WAVE LITHOTRIPSY (ESWL) GAITED;  Surgeon: Cleon Gustin, MD;  Location: [facility];  Service: Urology;  Laterality: Right;  . EXTRACORPOREAL SHOCK WAVE LITHOTRIPSY Right 02/10/2016   Procedure: EXTRACORPOREAL SHOCK WAVE LITHOTRIPSY (ESWL);  Surgeon: Kathie Rhodes, MD;  Location: [facility];  Service: Urology;  Laterality: Right;  [phone] [id number] [employer]- [id number]   . EYE SURGERY     macular degeneration  . GALLBLADDER SURGERY  2006   no cholecystectomy  . HERNIA REPAIR    . LUNG REMOVAL, PARTIAL  age 39   for bronchiectasis  . PARTIAL THYMECTOMY  1985   for nodules  . POPLITEAL SYNOVIAL CYST EXCISION  2005  . TOTAL KNEE ARTHROPLASTY  2008   right  . TOTAL KNEE ARTHROPLASTY  2010   left       Home Medications    Prior to Admission medications   Medication Sig Start Date End Date Taking? Authorizing Provider  acetaminophen (TYLENOL) 500 MG tablet Take 1,000 mg by mouth every 6 (six) hours as needed for mild pain, moderate pain, fever or headache.   Yes Historical Provider, MD  alfuzosin (UROXATRAL) 10 MG 24 hr tablet Take 1 tablet (10 mg total) by mouth at bedtime. 02/10/16  Yes Kathie Rhodes, MD  aspirin EC 81 MG tablet Take 81 mg by mouth daily.    Yes Historical Provider, MD  carbidopa-levodopa (SINEMET IR) 25-100 MG tablet Take 1 tablet by mouth 3 (three) times daily.    Yes Historical Provider, MD  colesevelam (WELCHOL) 625 MG tablet Take 1,250 mg by mouth 3 (three) times daily with meals.    Yes Historical Provider, MD  COMBIGAN 0.2-0.5 % ophthalmic solution Place 1 drop into both eyes every 12 (twelve) hours.     Yes Historical Provider, MD  diltiazem (CARTIA XT) 240 MG 24 hr capsule Take 240 mg by mouth daily.   Yes Historical Provider, MD  furosemide (LASIX) 20 MG tablet Take 20 mg by mouth daily.    Yes Historical Provider, MD  levothyroxine (SYNTHROID, LEVOTHROID) 200 MCG tablet Take 200 mcg by mouth daily before breakfast. Pt takes with a 29mcg tablet.   Yes Historical Provider, MD  levothyroxine (SYNTHROID, LEVOTHROID) 75 MCG tablet Take 75 mcg by mouth daily before breakfast. Pt takes with a 287mcg tablet.   Yes Historical Provider, MD  Meth-Hyo-M Bl-Na Phos-Ph Sal (URIBEL) 118 MG CAPS Take 1 capsule (118 mg total) by mouth 3 (three) times daily as needed (burning). 01/27/16  Yes Cleon Gustin, MD  mirabegron ER (MYRBETRIQ) 50 MG TB24 tablet Take 1 tablet (50 mg total) by mouth daily. 01/28/16  Yes Cleon Gustin, MD  multivitamin-lutein [facility]) CAPS capsule Take 1 capsule by mouth daily.   Yes Historical Provider, MD  omeprazole (PRILOSEC) 20 MG capsule Take 20  mg by mouth daily.   Yes Historical Provider, MD  polyethylene glycol (MIRALAX / GLYCOLAX) packet Take 17 g by mouth daily.   Yes Historical Provider, MD  sitaGLIPtin (JANUVIA) 50 MG tablet Take 1 tablet (50 mg total) by mouth daily. 10/25/15  Yes Orson Eva, MD  traMADol (ULTRAM) 50 MG tablet Take 1 tablet (50 mg total) by mouth every 6 (six) hours as needed. Patient taking differently: Take 50 mg by mouth every 6 (six) hours as needed for moderate pain.  01/26/16  Yes Cleon Gustin, MD  zolpidem (AMBIEN) 5 MG tablet Take 5 mg by mouth at bedtime as needed for sleep.   Yes Historical Provider, MD  ciprofloxacin (CIPRO) 250 MG tablet Take 1 tablet (250 mg total) by mouth 2 (two) times daily. 04/23/16   Franchot Gallo, MD    Family History Family History  Problem Relation Age of Onset  . Other Sister     ophth disease  . Heart attack Father     x7  . Heart disease Father     MI x7  . Hypertension Mother   .  Stroke Mother 53    Social History Social History  Substance Use Topics  . Smoking status: Never Smoker  . Smokeless tobacco: Never Used  . Alcohol use No     Allergies   Colchicine; Hibiclens [chlorhexidine]; Atorvastatin; Ceftin; Celebrex [celecoxib]; Codeine; Contrast media [iodinated diagnostic agents]; Erythromycin; Ezetimibe; Oxycodone-acetaminophen; Rosuvastatin; Simvastatin; Cefpodoxime; Cefuroxime axetil; Nitroglycerin; Tetanus toxoid; and Vantin   Review of Systems Review of Systems  Constitutional: Negative for fever.  Gastrointestinal: Negative for nausea and vomiting.  Genitourinary: Positive for dysuria, frequency and penile pain. Negative for flank pain.  All other systems reviewed and are negative.  Physical Exam Updated Vital Signs BP 109/63 (BP Location: Left Arm)   Pulse 71   Temp 98 F (36.7 C) (Oral)   Resp 18   SpO2 98%   Physical Exam  Constitutional: He is oriented to person, place, and time. He appears well-developed and well-nourished. No distress.  HENT:  Head: Normocephalic and atraumatic.  Right Ear: External ear normal.  Left Ear: External ear normal.  Nose: Nose normal.  Eyes: Right eye exhibits no discharge. Left eye exhibits no discharge.  Neck: Neck supple.  Cardiovascular: Normal rate, regular rhythm and normal heart sounds.   Pulmonary/Chest: Effort normal and breath sounds normal.  Abdominal: Soft. There is no tenderness.  No CVA tenderness  Genitourinary:  Genitourinary Comments: Small string exiting penis. Mild light erythema at the tip of penis. No penile tenderness.  Musculoskeletal: He exhibits no edema.  Neurological: He is alert and oriented to person, place, and time.  Skin: Skin is warm and dry. He is not diaphoretic.  Nursing note and vitals reviewed.  ED Treatments / Results  DIAGNOSTIC STUDIES:  Oxygen Saturation is 97% on RA, normal by my interpretation.    COORDINATION OF CARE:  11:12 PM Will order BMP, CBC  and UA. Discussed treatment plan with pt at bedside and pt agreed to plan.   Labs (all labs ordered are listed, but only abnormal results are displayed) Labs Reviewed  URINALYSIS, ROUTINE W REFLEX MICROSCOPIC - Abnormal; Notable for the following:       Result Value   Color, Urine BLUE (*)    APPearance CLOUDY (*)    Hgb urine dipstick LARGE (*)    Protein, ur 100 (*)    Leukocytes, UA LARGE (*)    Bacteria, UA RARE (*)  Squamous Epithelial / LPF 0-5 (*)    All other components within normal limits  BASIC METABOLIC PANEL - Abnormal; Notable for the following:    Glucose, Bld 195 (*)    BUN 41 (*)    Creatinine, Ser 1.88 (*)    Calcium 8.7 (*)    GFR calc non Af Amer 32 (*)    GFR calc Af Amer 37 (*)    All other components within normal limits  CBC WITH DIFFERENTIAL/PLATELET - Abnormal; Notable for the following:    RBC 3.27 (*)    Hemoglobin 9.9 (*)    HCT 30.0 (*)    All other components within normal limits  URINE CULTURE    EKG  EKG Interpretation None       Radiology Dg Abdomen 1 View  Result Date: 04/23/2016 CLINICAL DATA:  Acute onset of increased urinary frequency and spasms. Recent right ureteral stent placement. Initial encounter. EXAM: ABDOMEN - 1 VIEW COMPARISON:  Abdominal radiograph performed 04/22/2016 FINDINGS: The visualized bowel gas pattern is unremarkable. Scattered air and stool filled loops of colon are seen; no abnormal dilatation of small bowel loops is seen to suggest small bowel obstruction. No free intra-abdominal air is identified, though evaluation for free air is limited on a single supine view. Mild degenerative change is noted along the lumbar spine; the sacroiliac joints are unremarkable in appearance. The right ureteral stent is noted overlying expected position. IMPRESSION: 1. Unremarkable bowel gas pattern; no free intra-abdominal air seen. Moderate amount of stool noted in the colon. 2. Mild degenerative change along the lumbar spine. 3.  Right ureteral stent noted overlying expected position. Electronically Signed   By: Garald Balding M.D.   On: 04/23/2016 23:39   Dg C-arm 1-60 Min-no Report  Result Date: 04/22/2016 Fluoroscopy was utilized by the requesting physician.  No radiographic interpretation.    Procedures Procedures (including critical care time)  Medications Ordered in ED Medications  fosfomycin (MONUROL) packet 3 g (not administered)  sodium chloride 0.9 % bolus 1,000 mL (1,000 mLs Intravenous New Bag/Given 04/24/16 0144)     Initial Impression / Assessment and Plan / ED Course  I have reviewed the triage vital signs and the nursing notes.  Pertinent labs & imaging results that were available during my care of the patient were reviewed by me and considered in my medical decision making (see chart for details).     Patient's spasms/pain have resolved while in the ED without treatment. He is overall well-appearing. His creatinine is mildly increased from the last recheck in the hospital. Given this he was given IV fluids. His urine has large leukocytes which could be from the recent stent placement versus a UTI causing discomfort when urinating. The pain could also be from his stent as well. He has multiple allergies including to cephalosporins and Celebrex which will not allowed to have Bactrim. He does not want to take the Cipro given his concern for side effects/medication interaction. To this, will try fosfomycin in the ED given that this seems to be lower urinary tract area and there is no flank pain, vomiting, or fever. I have discussed needing to follow-up closely with urology and have referred by note to them as well. Given his well appearance and feel he can be discharge with close outpatient follow-up.  Final Clinical Impressions(s) / ED Diagnoses   Final diagnoses:  Spasm of bladder    New Prescriptions New Prescriptions   No medications on file   I personally  performed the services described  in this documentation, which was scribed in my presence. The recorded information has been reviewed and is accurate.    Sherwood Gambler, MD 04/24/16 [phone]

## 2016-04-23 NOTE — Discharge Summary (Signed)
Patient ID: TYLEEK SMICK MRN: [id number] DOB/AGE: 81-Jul-1933 81 y.o.  Admit date: 04/22/2016 Discharge date: 04/23/2016  Primary Care Physician:  Mathews Argyle, MD  Discharge Diagnoses:   Retained ureteral stent  Consults:  None     Discharge Medications   Significant Diagnostic Studies:  Dg C-arm 1-60 Min-no Report  Result Date: 04/22/2016 Fluoroscopy was utilized by the requesting physician.  No radiographic interpretation.    Brief H and P: For complete details please refer to admission H and P, but in brief , the patient was admitted for urgent management of a retained right ureteral stent.  Hospital Course: The patient was admitted for management of a retained ureteral stent.  He underwent urgent cystoscopy, retrograde and stent extraction.  He tolerated this well.  Foley was left in overnight.  He tolerated his procedure well and was discharged on postoperative day #1. Active Problems:   Retained ureteral stent   Day of Discharge BP 104/64 (BP Location: Right Arm)   Pulse 80   Temp 98.3 F (36.8 C) (Oral)   Resp 18   Ht 5\' 7"  (1.702 m)   Wt 67.1 kg (148 lb)   SpO2 98%   BMI 23.18 kg/m   Results for orders placed or performed during the hospital encounter of 04/22/16 (from the past 24 hour(s))  Glucose, capillary     Status: Abnormal   Collection Time: 04/22/16  5:33 PM  Result Value Ref Range   Glucose-Capillary 107 (H) 65 - 99 mg/dL  Basic metabolic panel     Status: Abnormal   Collection Time: 04/22/16  5:48 PM  Result Value Ref Range   Sodium 138 135 - 145 mmol/L   Potassium 4.2 3.5 - 5.1 mmol/L   Chloride 106 101 - 111 mmol/L   CO2 24 22 - 32 mmol/L   Glucose, Bld 105 (H) 65 - 99 mg/dL   BUN 33 (H) 6 - 20 mg/dL   Creatinine, Ser 1.57 (H) 0.61 - 1.24 mg/dL   Calcium 8.7 (L) 8.9 - 10.3 mg/dL   GFR calc non Af Amer 39 (L) >60 mL/min   GFR calc Af Amer 46 (L) >60 mL/min   Anion gap 8 5 - 15  Glucose, capillary     Status: None   Collection  Time: 04/22/16  8:59 PM  Result Value Ref Range   Glucose-Capillary 89 65 - 99 mg/dL  MRSA PCR Screening     Status: None   Collection Time: 04/22/16 11:27 PM  Result Value Ref Range   MRSA by PCR NEGATIVE NEGATIVE  Basic metabolic panel     Status: Abnormal   Collection Time: 04/23/16  6:19 AM  Result Value Ref Range   Sodium 137 135 - 145 mmol/L   Potassium 4.3 3.5 - 5.1 mmol/L   Chloride 105 101 - 111 mmol/L   CO2 23 22 - 32 mmol/L   Glucose, Bld 225 (H) 65 - 99 mg/dL   BUN 30 (H) 6 - 20 mg/dL   Creatinine, Ser 1.47 (H) 0.61 - 1.24 mg/dL   Calcium 8.6 (L) 8.9 - 10.3 mg/dL   GFR calc non Af Amer 43 (L) >60 mL/min   GFR calc Af Amer 49 (L) >60 mL/min   Anion gap 9 5 - 15    Physical Exam: General: Alert and awake oriented x3 not in any acute distress. HEENT: anicteric sclera, pupils reactive to light and accommodation CVS: S1-S2 clear no murmur rubs or gallops Chest: clear to auscultation bilaterally,  no wheezing rales or rhonchi Abdomen: soft nontender, nondistended, normal bowel sounds, no organomegaly Extremities: no cyanosis, clubbing or edema noted bilaterally Neuro: Cranial nerves II-XII intact, no focal neurological deficits  Disposition:  Home  Diet:  No restrictions  Activity:  Gradually increase   Disposition and Follow-up:    We will call to follow-up  TESTS THAT NEED FOLLOW-UP  We will check urine culture done in the office the day of admission  DISCHARGE FOLLOW-UP   Time spent on Discharge:  15 minutes  Signed: Jorja Loa 04/23/2016, 10:31 AM

## 2016-04-23 NOTE — Care Management Note (Signed)
Case Management Note  Patient Details  Name: DEMARR [name] MRN: [id number] Date of Birth: [birth year]  Subjective/Objective:   83 y/o m admitted w/Retained ureteral stent. From home.                 Action/Plan:d/c home.   Expected Discharge Date:  04/23/16               Expected Discharge Plan:  Home/Self Care  In-House Referral:     Discharge planning Services  CM Consult  Post Acute Care Choice:    Choice offered to:     DME Arranged:    DME Agency:     HH Arranged:    HH Agency:     Status of Service:  Completed, signed off  If discussed at [employer] of Stay Meetings, dates discussed:    Additional Comments:  Dessa Phi, RN 04/23/2016, 10:36 AM

## 2016-04-23 NOTE — Discharge Instructions (Signed)
1. You may see some blood in the urine and may have some burning with urination for 48-72 hours. You also may notice that you have to urinate more frequently or urgently after your procedure which is normal.  2. You should call should you develop an inability urinate, fever > 101, persistent nausea and vomiting that prevents you from eating or drinking to stay hydrated.  3. If you have a stent, you will likely urinate more frequently and urgently until the stent is removed and you may experience some discomfort/pain in the lower abdomen and flank especially when urinating. You may take pain medication prescribed to you if needed for pain. You may also intermittently have blood in the urine until the stent is removed.  It is okay to pull the string to remove the stent on Monday morning. 4. If you have a catheter, you will be taught how to take care of the catheter by the nursing staff prior to discharge from the hospital.  You may periodically feel a strong urge to void with the catheter in place.  This is a bladder spasm and most often can occur when having a bowel movement or moving around. It is typically self-limited and usually will stop after a few minutes.  You may use some Vaseline or Neosporin around the tip of the catheter to reduce friction at the tip of the penis. You may also see some blood in the urine.  A very small amount of blood can make the urine look quite red.  As long as the catheter is draining well, there usually is not a problem.  However, if the catheter is not draining well and is bloody, you should call the office ([phone]) to notify us. 

## 2016-04-23 NOTE — ED Triage Notes (Addendum)
Pt had stent placed in kidney yesterday.  Pt was d/c from the hospital today. Has had urinary frequency since.  Voiding 4x an hour.

## 2016-04-23 NOTE — Care Management Note (Signed)
Case Management Note  Patient Details  Name: [name] MRN: [id number] Date of Birth: [birth year]  Subjective/Objective: 83 y/o m from [employer]. No CM needs.                   Action/Plan:d/c back to indep liv.   Expected Discharge Date:  04/23/16               Expected Discharge Plan:  Home/Self Care  In-House Referral:  Clinical Social Work  Discharge planning Services  CM Consult  Post Acute Care Choice:    Choice offered to:     DME Arranged:    DME Agency:     HH Arranged:    HH Agency:     Status of Service:  Completed, signed off  If discussed at [employer] of [employer], dates discussed:    Additional Comments:  Dessa Phi, RN 04/23/2016, 11:43 AM

## 2016-04-23 NOTE — ED Notes (Signed)
Bed: WA02 Expected date:  Expected time:  Means of arrival:  Comments: 83 yr old, UTI

## 2016-04-23 NOTE — Care Management Note (Signed)
Case Management Note  Patient Details  Name: [name] MRN: [id number] Date of Birth: 17-May-1931  Subjective/Objective: From [place]. CSW managing d/c back.                   Action/Plan:d/c [facility].   Expected Discharge Date:  04/23/16               Expected Discharge Plan:  In-House Referral:  Clinical Social Work  Discharge planning Services  CM Consult  Post Acute Care Choice:    Choice offered to:     DME Arranged:    DME Agency:     HH Arranged:    [place] Agency:     Status of Service:  Completed, signed off  If discussed at [employer] of [employer], dates discussed:    Additional Comments:  Dessa Phi, RN 04/23/2016, 11:34 AM

## 2016-04-24 DIAGNOSIS — R339 Retention of urine, unspecified: Secondary | ICD-10-CM | POA: Diagnosis not present

## 2016-04-24 DIAGNOSIS — T8389XA Other specified complication of genitourinary prosthetic devices, implants and grafts, initial encounter: Secondary | ICD-10-CM | POA: Diagnosis not present

## 2016-04-24 DIAGNOSIS — T83091A Other mechanical complication of indwelling urethral catheter, initial encounter: Secondary | ICD-10-CM | POA: Diagnosis not present

## 2016-04-24 LAB — BASIC METABOLIC PANEL
Anion gap: 9 (ref 5–15)
BUN: 41 mg/dL — AB (ref 6–20)
CHLORIDE: 104 mmol/L (ref 101–111)
CO2: 23 mmol/L (ref 22–32)
Calcium: 8.7 mg/dL — ABNORMAL LOW (ref 8.9–10.3)
Creatinine, Ser: 1.88 mg/dL — ABNORMAL HIGH (ref 0.61–1.24)
GFR calc Af Amer: 37 mL/min — ABNORMAL LOW (ref >60)
GFR calc non Af Amer: 32 mL/min — ABNORMAL LOW (ref >60)
Glucose, Bld: 195 mg/dL — ABNORMAL HIGH (ref 65–99)
POTASSIUM: 4.6 mmol/L (ref 3.5–5.1)
SODIUM: 136 mmol/L (ref 135–145)

## 2016-04-24 LAB — URINALYSIS, ROUTINE W REFLEX MICROSCOPIC
Bilirubin Urine: NEGATIVE
GLUCOSE, UA: NEGATIVE mg/dL
Ketones, ur: NEGATIVE mg/dL
Nitrite: NEGATIVE
PH: 5 (ref 5.0–8.0)
Protein, ur: 100 mg/dL — AB
SPECIFIC GRAVITY, URINE: 1.016 (ref 1.005–1.030)

## 2016-04-24 MED ORDER — SODIUM CHLORIDE 0.9 % IV BOLUS (SEPSIS)
1000.0000 mL | Freq: Once | INTRAVENOUS | Status: AC
  Administered 2016-04-24: 1000 mL via INTRAVENOUS

## 2016-04-24 MED ORDER — FOSFOMYCIN TROMETHAMINE 3 G PO PACK: 3.0000 g | PACK | Freq: Once | ORAL | Status: DC

## 2016-04-24 MED ORDER — FOSFOMYCIN TROMETHAMINE 3 G PO PACK
3.0000 g | PACK | Freq: Once | ORAL | Status: AC
  Administered 2016-04-24: 3 g via ORAL
  Filled 2016-04-24: qty 3

## 2016-04-24 NOTE — ED Notes (Signed)
Called facility, they are sending someone to pick him up.

## 2016-04-24 NOTE — ED Notes (Signed)
[facility] called for transport.  

## 2016-04-25 LAB — URINE CULTURE: Culture: NO GROWTH

## 2016-05-04 DIAGNOSIS — G219 Secondary parkinsonism, unspecified: Secondary | ICD-10-CM | POA: Diagnosis not present

## 2016-05-04 DIAGNOSIS — M6281 Muscle weakness (generalized): Secondary | ICD-10-CM | POA: Diagnosis not present

## 2016-05-04 DIAGNOSIS — R278 Other lack of coordination: Secondary | ICD-10-CM | POA: Diagnosis not present

## 2016-05-04 DIAGNOSIS — R262 Difficulty in walking, not elsewhere classified: Secondary | ICD-10-CM | POA: Diagnosis not present

## 2016-05-04 DIAGNOSIS — G20 Parkinson's disease: Secondary | ICD-10-CM | POA: Diagnosis not present

## 2016-05-05 DIAGNOSIS — R278 Other lack of coordination: Secondary | ICD-10-CM | POA: Diagnosis not present

## 2016-05-05 DIAGNOSIS — E119 Type 2 diabetes mellitus without complications: Secondary | ICD-10-CM | POA: Diagnosis not present

## 2016-05-05 DIAGNOSIS — I10 Essential (primary) hypertension: Secondary | ICD-10-CM | POA: Diagnosis not present

## 2016-05-05 DIAGNOSIS — E039 Hypothyroidism, unspecified: Secondary | ICD-10-CM | POA: Diagnosis not present

## 2016-05-05 DIAGNOSIS — H3530 Unspecified macular degeneration: Secondary | ICD-10-CM | POA: Diagnosis not present

## 2016-05-05 DIAGNOSIS — R600 Localized edema: Secondary | ICD-10-CM | POA: Diagnosis not present

## 2016-05-05 DIAGNOSIS — H409 Unspecified glaucoma: Secondary | ICD-10-CM | POA: Diagnosis not present

## 2016-05-05 DIAGNOSIS — G4700 Insomnia, unspecified: Secondary | ICD-10-CM | POA: Diagnosis not present

## 2016-05-05 DIAGNOSIS — E559 Vitamin D deficiency, unspecified: Secondary | ICD-10-CM | POA: Diagnosis not present

## 2016-05-05 DIAGNOSIS — R262 Difficulty in walking, not elsewhere classified: Secondary | ICD-10-CM | POA: Diagnosis not present

## 2016-05-05 DIAGNOSIS — K5900 Constipation, unspecified: Secondary | ICD-10-CM | POA: Diagnosis not present

## 2016-05-05 DIAGNOSIS — R05 Cough: Secondary | ICD-10-CM | POA: Diagnosis not present

## 2016-05-05 DIAGNOSIS — M6281 Muscle weakness (generalized): Secondary | ICD-10-CM | POA: Diagnosis not present

## 2016-05-05 DIAGNOSIS — G20 Parkinson's disease: Secondary | ICD-10-CM | POA: Diagnosis not present

## 2016-05-05 DIAGNOSIS — K219 Gastro-esophageal reflux disease without esophagitis: Secondary | ICD-10-CM | POA: Diagnosis not present

## 2016-05-05 DIAGNOSIS — G219 Secondary parkinsonism, unspecified: Secondary | ICD-10-CM | POA: Diagnosis not present

## 2016-05-05 DIAGNOSIS — N400 Enlarged prostate without lower urinary tract symptoms: Secondary | ICD-10-CM | POA: Diagnosis not present

## 2016-05-07 DIAGNOSIS — R278 Other lack of coordination: Secondary | ICD-10-CM | POA: Diagnosis not present

## 2016-05-07 DIAGNOSIS — M6281 Muscle weakness (generalized): Secondary | ICD-10-CM | POA: Diagnosis not present

## 2016-05-07 DIAGNOSIS — G219 Secondary parkinsonism, unspecified: Secondary | ICD-10-CM | POA: Diagnosis not present

## 2016-05-07 DIAGNOSIS — G20 Parkinson's disease: Secondary | ICD-10-CM | POA: Diagnosis not present

## 2016-05-07 DIAGNOSIS — R262 Difficulty in walking, not elsewhere classified: Secondary | ICD-10-CM | POA: Diagnosis not present

## 2016-05-08 DIAGNOSIS — R278 Other lack of coordination: Secondary | ICD-10-CM | POA: Diagnosis not present

## 2016-05-08 DIAGNOSIS — R262 Difficulty in walking, not elsewhere classified: Secondary | ICD-10-CM | POA: Diagnosis not present

## 2016-05-08 DIAGNOSIS — G20 Parkinson's disease: Secondary | ICD-10-CM | POA: Diagnosis not present

## 2016-05-08 DIAGNOSIS — G219 Secondary parkinsonism, unspecified: Secondary | ICD-10-CM | POA: Diagnosis not present

## 2016-05-08 DIAGNOSIS — M6281 Muscle weakness (generalized): Secondary | ICD-10-CM | POA: Diagnosis not present

## 2016-05-12 DIAGNOSIS — F4321 Adjustment disorder with depressed mood: Secondary | ICD-10-CM | POA: Diagnosis not present

## 2016-05-12 DIAGNOSIS — G4700 Insomnia, unspecified: Secondary | ICD-10-CM | POA: Diagnosis not present

## 2016-05-14 DIAGNOSIS — G219 Secondary parkinsonism, unspecified: Secondary | ICD-10-CM | POA: Diagnosis not present

## 2016-05-14 DIAGNOSIS — G20 Parkinson's disease: Secondary | ICD-10-CM | POA: Diagnosis not present

## 2016-05-14 DIAGNOSIS — R262 Difficulty in walking, not elsewhere classified: Secondary | ICD-10-CM | POA: Diagnosis not present

## 2016-05-14 DIAGNOSIS — M6281 Muscle weakness (generalized): Secondary | ICD-10-CM | POA: Diagnosis not present

## 2016-05-15 DIAGNOSIS — E782 Mixed hyperlipidemia: Secondary | ICD-10-CM | POA: Diagnosis not present

## 2016-05-15 DIAGNOSIS — G219 Secondary parkinsonism, unspecified: Secondary | ICD-10-CM | POA: Diagnosis not present

## 2016-05-15 DIAGNOSIS — R262 Difficulty in walking, not elsewhere classified: Secondary | ICD-10-CM | POA: Diagnosis not present

## 2016-05-15 DIAGNOSIS — G20 Parkinson's disease: Secondary | ICD-10-CM | POA: Diagnosis not present

## 2016-05-15 DIAGNOSIS — M6281 Muscle weakness (generalized): Secondary | ICD-10-CM | POA: Diagnosis not present

## 2016-05-15 DIAGNOSIS — I10 Essential (primary) hypertension: Secondary | ICD-10-CM | POA: Diagnosis not present

## 2016-05-15 DIAGNOSIS — E039 Hypothyroidism, unspecified: Secondary | ICD-10-CM | POA: Diagnosis not present

## 2016-05-15 DIAGNOSIS — E119 Type 2 diabetes mellitus without complications: Secondary | ICD-10-CM | POA: Diagnosis not present

## 2016-05-16 DIAGNOSIS — G20 Parkinson's disease: Secondary | ICD-10-CM | POA: Diagnosis not present

## 2016-05-16 DIAGNOSIS — G219 Secondary parkinsonism, unspecified: Secondary | ICD-10-CM | POA: Diagnosis not present

## 2016-05-16 DIAGNOSIS — M6281 Muscle weakness (generalized): Secondary | ICD-10-CM | POA: Diagnosis not present

## 2016-05-16 DIAGNOSIS — R262 Difficulty in walking, not elsewhere classified: Secondary | ICD-10-CM | POA: Diagnosis not present

## 2016-05-19 DIAGNOSIS — R278 Other lack of coordination: Secondary | ICD-10-CM | POA: Diagnosis not present

## 2016-05-19 DIAGNOSIS — G20 Parkinson's disease: Secondary | ICD-10-CM | POA: Diagnosis not present

## 2016-05-20 DIAGNOSIS — G20 Parkinson's disease: Secondary | ICD-10-CM | POA: Diagnosis not present

## 2016-05-20 DIAGNOSIS — R262 Difficulty in walking, not elsewhere classified: Secondary | ICD-10-CM | POA: Diagnosis not present

## 2016-05-20 DIAGNOSIS — M6281 Muscle weakness (generalized): Secondary | ICD-10-CM | POA: Diagnosis not present

## 2016-05-20 DIAGNOSIS — G219 Secondary parkinsonism, unspecified: Secondary | ICD-10-CM | POA: Diagnosis not present

## 2016-05-20 DIAGNOSIS — R278 Other lack of coordination: Secondary | ICD-10-CM | POA: Diagnosis not present

## 2016-05-21 DIAGNOSIS — D539 Nutritional anemia, unspecified: Secondary | ICD-10-CM | POA: Diagnosis not present

## 2016-05-21 DIAGNOSIS — Z79899 Other long term (current) drug therapy: Secondary | ICD-10-CM | POA: Diagnosis not present

## 2016-05-21 DIAGNOSIS — R600 Localized edema: Secondary | ICD-10-CM | POA: Diagnosis not present

## 2016-05-21 DIAGNOSIS — N183 Chronic kidney disease, stage 3 (moderate): Secondary | ICD-10-CM | POA: Diagnosis not present

## 2016-05-21 DIAGNOSIS — I129 Hypertensive chronic kidney disease with stage 1 through stage 4 chronic kidney disease, or unspecified chronic kidney disease: Secondary | ICD-10-CM | POA: Diagnosis not present

## 2016-05-22 DIAGNOSIS — R278 Other lack of coordination: Secondary | ICD-10-CM | POA: Diagnosis not present

## 2016-05-22 DIAGNOSIS — G20 Parkinson's disease: Secondary | ICD-10-CM | POA: Diagnosis not present

## 2016-05-26 DIAGNOSIS — G20 Parkinson's disease: Secondary | ICD-10-CM | POA: Diagnosis not present

## 2016-05-26 DIAGNOSIS — G219 Secondary parkinsonism, unspecified: Secondary | ICD-10-CM | POA: Diagnosis not present

## 2016-05-26 DIAGNOSIS — R262 Difficulty in walking, not elsewhere classified: Secondary | ICD-10-CM | POA: Diagnosis not present

## 2016-05-26 DIAGNOSIS — M6281 Muscle weakness (generalized): Secondary | ICD-10-CM | POA: Diagnosis not present

## 2016-05-27 DIAGNOSIS — E119 Type 2 diabetes mellitus without complications: Secondary | ICD-10-CM | POA: Diagnosis not present

## 2016-05-27 DIAGNOSIS — Q845 Enlarged and hypertrophic nails: Secondary | ICD-10-CM | POA: Diagnosis not present

## 2016-05-27 DIAGNOSIS — L84 Corns and callosities: Secondary | ICD-10-CM | POA: Diagnosis not present

## 2016-05-27 DIAGNOSIS — M2010 Hallux valgus (acquired), unspecified foot: Secondary | ICD-10-CM | POA: Diagnosis not present

## 2016-05-27 DIAGNOSIS — G20 Parkinson's disease: Secondary | ICD-10-CM | POA: Diagnosis not present

## 2016-05-27 DIAGNOSIS — I739 Peripheral vascular disease, unspecified: Secondary | ICD-10-CM | POA: Diagnosis not present

## 2016-05-27 DIAGNOSIS — R600 Localized edema: Secondary | ICD-10-CM | POA: Diagnosis not present

## 2016-05-27 DIAGNOSIS — M6281 Muscle weakness (generalized): Secondary | ICD-10-CM | POA: Diagnosis not present

## 2016-05-27 DIAGNOSIS — L603 Nail dystrophy: Secondary | ICD-10-CM | POA: Diagnosis not present

## 2016-05-27 DIAGNOSIS — R262 Difficulty in walking, not elsewhere classified: Secondary | ICD-10-CM | POA: Diagnosis not present

## 2016-05-27 DIAGNOSIS — B351 Tinea unguium: Secondary | ICD-10-CM | POA: Diagnosis not present

## 2016-05-27 DIAGNOSIS — G219 Secondary parkinsonism, unspecified: Secondary | ICD-10-CM | POA: Diagnosis not present

## 2016-05-27 DIAGNOSIS — G609 Hereditary and idiopathic neuropathy, unspecified: Secondary | ICD-10-CM | POA: Diagnosis not present

## 2016-05-27 DIAGNOSIS — L853 Xerosis cutis: Secondary | ICD-10-CM | POA: Diagnosis not present

## 2016-05-29 DIAGNOSIS — R262 Difficulty in walking, not elsewhere classified: Secondary | ICD-10-CM | POA: Diagnosis not present

## 2016-05-29 DIAGNOSIS — G219 Secondary parkinsonism, unspecified: Secondary | ICD-10-CM | POA: Diagnosis not present

## 2016-05-29 DIAGNOSIS — R278 Other lack of coordination: Secondary | ICD-10-CM | POA: Diagnosis not present

## 2016-05-29 DIAGNOSIS — M6281 Muscle weakness (generalized): Secondary | ICD-10-CM | POA: Diagnosis not present

## 2016-05-29 DIAGNOSIS — G20 Parkinson's disease: Secondary | ICD-10-CM | POA: Diagnosis not present

## 2016-06-01 DIAGNOSIS — R262 Difficulty in walking, not elsewhere classified: Secondary | ICD-10-CM | POA: Diagnosis not present

## 2016-06-01 DIAGNOSIS — M6281 Muscle weakness (generalized): Secondary | ICD-10-CM | POA: Diagnosis not present

## 2016-06-01 DIAGNOSIS — G20 Parkinson's disease: Secondary | ICD-10-CM | POA: Diagnosis not present

## 2016-06-01 DIAGNOSIS — G219 Secondary parkinsonism, unspecified: Secondary | ICD-10-CM | POA: Diagnosis not present

## 2016-06-02 DIAGNOSIS — E039 Hypothyroidism, unspecified: Secondary | ICD-10-CM | POA: Diagnosis not present

## 2016-06-02 DIAGNOSIS — R262 Difficulty in walking, not elsewhere classified: Secondary | ICD-10-CM | POA: Diagnosis not present

## 2016-06-02 DIAGNOSIS — E119 Type 2 diabetes mellitus without complications: Secondary | ICD-10-CM | POA: Diagnosis not present

## 2016-06-02 DIAGNOSIS — E559 Vitamin D deficiency, unspecified: Secondary | ICD-10-CM | POA: Diagnosis not present

## 2016-06-02 DIAGNOSIS — G20 Parkinson's disease: Secondary | ICD-10-CM | POA: Diagnosis not present

## 2016-06-02 DIAGNOSIS — H409 Unspecified glaucoma: Secondary | ICD-10-CM | POA: Diagnosis not present

## 2016-06-02 DIAGNOSIS — G219 Secondary parkinsonism, unspecified: Secondary | ICD-10-CM | POA: Diagnosis not present

## 2016-06-02 DIAGNOSIS — N400 Enlarged prostate without lower urinary tract symptoms: Secondary | ICD-10-CM | POA: Diagnosis not present

## 2016-06-02 DIAGNOSIS — M6281 Muscle weakness (generalized): Secondary | ICD-10-CM | POA: Diagnosis not present

## 2016-06-02 DIAGNOSIS — H3530 Unspecified macular degeneration: Secondary | ICD-10-CM | POA: Diagnosis not present

## 2016-06-02 DIAGNOSIS — K219 Gastro-esophageal reflux disease without esophagitis: Secondary | ICD-10-CM | POA: Diagnosis not present

## 2016-06-02 DIAGNOSIS — K5900 Constipation, unspecified: Secondary | ICD-10-CM | POA: Diagnosis not present

## 2016-06-02 DIAGNOSIS — G4700 Insomnia, unspecified: Secondary | ICD-10-CM | POA: Diagnosis not present

## 2016-06-02 DIAGNOSIS — I10 Essential (primary) hypertension: Secondary | ICD-10-CM | POA: Diagnosis not present

## 2016-06-02 DIAGNOSIS — R05 Cough: Secondary | ICD-10-CM | POA: Diagnosis not present

## 2016-06-02 DIAGNOSIS — R600 Localized edema: Secondary | ICD-10-CM | POA: Diagnosis not present

## 2016-06-04 DIAGNOSIS — R638 Other symptoms and signs concerning food and fluid intake: Secondary | ICD-10-CM | POA: Diagnosis not present

## 2016-06-04 DIAGNOSIS — E119 Type 2 diabetes mellitus without complications: Secondary | ICD-10-CM | POA: Diagnosis not present

## 2016-06-04 DIAGNOSIS — G20 Parkinson's disease: Secondary | ICD-10-CM | POA: Diagnosis not present

## 2016-06-04 DIAGNOSIS — R600 Localized edema: Secondary | ICD-10-CM | POA: Diagnosis not present

## 2016-06-04 DIAGNOSIS — K219 Gastro-esophageal reflux disease without esophagitis: Secondary | ICD-10-CM | POA: Diagnosis not present

## 2016-06-04 DIAGNOSIS — R262 Difficulty in walking, not elsewhere classified: Secondary | ICD-10-CM | POA: Diagnosis not present

## 2016-06-04 DIAGNOSIS — M6281 Muscle weakness (generalized): Secondary | ICD-10-CM | POA: Diagnosis not present

## 2016-06-04 DIAGNOSIS — I10 Essential (primary) hypertension: Secondary | ICD-10-CM | POA: Diagnosis not present

## 2016-06-04 DIAGNOSIS — G219 Secondary parkinsonism, unspecified: Secondary | ICD-10-CM | POA: Diagnosis not present

## 2016-06-05 ENCOUNTER — Emergency Department (HOSPITAL_COMMUNITY)
Admission: EM | Admit: 2016-06-05 | Discharge: 2016-06-05 | Disposition: A | Discharge status: Other Acute Inpatient Hospital | Payer: Medicare Other | Attending: Emergency Medicine | Admitting: Emergency Medicine

## 2016-06-05 ENCOUNTER — Emergency Department (HOSPITAL_COMMUNITY): Payer: Medicare Other

## 2016-06-05 ENCOUNTER — Encounter (HOSPITAL_COMMUNITY): Payer: Self-pay

## 2016-06-05 DIAGNOSIS — S52124A Nondisplaced fracture of head of right radius, initial encounter for closed fracture: Secondary | ICD-10-CM | POA: Diagnosis not present

## 2016-06-05 DIAGNOSIS — S0990XA Unspecified injury of head, initial encounter: Secondary | ICD-10-CM | POA: Diagnosis not present

## 2016-06-05 DIAGNOSIS — S064X0A Epidural hemorrhage without loss of consciousness, initial encounter: Secondary | ICD-10-CM | POA: Diagnosis not present

## 2016-06-05 DIAGNOSIS — E039 Hypothyroidism, unspecified: Secondary | ICD-10-CM | POA: Insufficient documentation

## 2016-06-05 DIAGNOSIS — M25521 Pain in right elbow: Secondary | ICD-10-CM | POA: Diagnosis not present

## 2016-06-05 DIAGNOSIS — E1122 Type 2 diabetes mellitus with diabetic chronic kidney disease: Secondary | ICD-10-CM | POA: Diagnosis not present

## 2016-06-05 DIAGNOSIS — S098XXA Other specified injuries of head, initial encounter: Secondary | ICD-10-CM | POA: Diagnosis not present

## 2016-06-05 DIAGNOSIS — R410 Disorientation, unspecified: Secondary | ICD-10-CM | POA: Diagnosis not present

## 2016-06-05 DIAGNOSIS — W01198A Fall on same level from slipping, tripping and stumbling with subsequent striking against other object, initial encounter: Secondary | ICD-10-CM | POA: Diagnosis not present

## 2016-06-05 DIAGNOSIS — Z96653 Presence of artificial knee joint, bilateral: Secondary | ICD-10-CM | POA: Insufficient documentation

## 2016-06-05 DIAGNOSIS — I1310 Hypertensive heart and chronic kidney disease without heart failure, with stage 1 through stage 4 chronic kidney disease, or unspecified chronic kidney disease: Secondary | ICD-10-CM | POA: Insufficient documentation

## 2016-06-05 DIAGNOSIS — Y939 Activity, unspecified: Secondary | ICD-10-CM | POA: Diagnosis not present

## 2016-06-05 DIAGNOSIS — N189 Chronic kidney disease, unspecified: Secondary | ICD-10-CM | POA: Insufficient documentation

## 2016-06-05 DIAGNOSIS — Y999 Unspecified external cause status: Secondary | ICD-10-CM | POA: Diagnosis not present

## 2016-06-05 DIAGNOSIS — W19XXXA Unspecified fall, initial encounter: Secondary | ICD-10-CM

## 2016-06-05 DIAGNOSIS — Z794 Long term (current) use of insulin: Secondary | ICD-10-CM | POA: Insufficient documentation

## 2016-06-05 DIAGNOSIS — S59901A Unspecified injury of right elbow, initial encounter: Secondary | ICD-10-CM | POA: Diagnosis not present

## 2016-06-05 DIAGNOSIS — S199XXA Unspecified injury of neck, initial encounter: Secondary | ICD-10-CM | POA: Diagnosis not present

## 2016-06-05 DIAGNOSIS — S4990XA Unspecified injury of shoulder and upper arm, unspecified arm, initial encounter: Secondary | ICD-10-CM | POA: Diagnosis not present

## 2016-06-05 DIAGNOSIS — Z7982 Long term (current) use of aspirin: Secondary | ICD-10-CM | POA: Insufficient documentation

## 2016-06-05 DIAGNOSIS — R2689 Other abnormalities of gait and mobility: Secondary | ICD-10-CM | POA: Diagnosis not present

## 2016-06-05 DIAGNOSIS — Y92129 Unspecified place in nursing home as the place of occurrence of the external cause: Secondary | ICD-10-CM | POA: Diagnosis not present

## 2016-06-05 DIAGNOSIS — Z85828 Personal history of other malignant neoplasm of skin: Secondary | ICD-10-CM | POA: Insufficient documentation

## 2016-06-05 DIAGNOSIS — S66929S Laceration of unspecified muscle, fascia and tendon at wrist and hand level, unspecified hand, sequela: Secondary | ICD-10-CM | POA: Diagnosis not present

## 2016-06-05 DIAGNOSIS — C61 Malignant neoplasm of prostate: Secondary | ICD-10-CM | POA: Insufficient documentation

## 2016-06-05 DIAGNOSIS — Z79899 Other long term (current) drug therapy: Secondary | ICD-10-CM | POA: Diagnosis not present

## 2016-06-05 NOTE — ED Notes (Signed)
Patient transported to CT 

## 2016-06-05 NOTE — Discharge Instructions (Signed)
Please follow up with Hand surgery, Dr. Amedeo Plenty in 1-2 weeks regarding today's visit. Please use rest, ice, compression, elevation to right elbow. Please also follow up with your primary care provider regarding today's visit.   IMPRESSION:  1. Suspect small elbow effusion. Possible subtle nondisplaced  fracture at the junction of the radial head and neck. No dislocation    Get help right away if: You have severe pain when you stretch your fingers. You have fluid or a bad smell coming from your splint. Your hand or fingers get cold or turn pale or blue. You lose feeling in any part of your hand or arm.

## 2016-06-05 NOTE — ED Provider Notes (Signed)
[facility] DEPT Provider Note   CSN: [id number] Arrival date & time: 06/05/16  1744     History   Chief Complaint Chief Complaint  Patient presents with  . Fall  . Extremity Laceration  . Bleeding/Bruising    HPI [name] is a 83 y.o. male with PMHx of CKD, DM type 2, HLD, dysrthymia, Parkinsons presents today s/p mechanical fall today at [facility] where he resides. He states he was standing up and pushing his chair back when he fell back and hit the back of his head with the A/C and wall and fell to the floor. He denies any pain at this time. He denies any associated symptoms. He denies trying anything. He denies LOC, back pain, or neck pain. He states he was sent straight here after incident. He denies any chest pain, SOB, fever, chills, N/v/d. He denies visual changed. He denies blood thinner use, besides daily baby aspirin. CBG by ems found to be 154. He states he has fallen in the past due to hypoglycemia.   The history is provided by the patient. No language interpreter was used.    Past Medical History:  Diagnosis Date  . Allergic rhinitis   . Arthritis    arthritis-kyphosis  . Asthmatic bronchitis   . Chronic kidney disease   . DM type 2 (diabetes mellitus, type 2) ([place])   . DVT, lower extremity, proximal, acute ([place])   . Dyslipidemia   . Dysrhythmia    '97 tachycardia- no recent issues  . GERD (gastroesophageal reflux disease)   . Hearing impaired    bilateral hearing aids  . History of kidney stones   . History of skin cancer   . Hypertension   . Hypothyroidism   . Macular degeneration    injections done bilaterally recent - 08-22-13  . Prostate cancer [facility])    Prostate cancer-radiation only,skin cancer-basal cell and last squamous cell right eye  . Stroke ([place])   . Urgency-frequency syndrome   . Urticaria     Patient Active Problem List   Diagnosis Date Noted  . Retained ureteral stent 04/22/2016  . Ureteral calculus 01/25/2016  . Mental  status change 10/24/2015  . Encephalopathy 10/24/2015  . Hypothermia 10/24/2015  . Chronic renal insufficiency 10/24/2015  . Benign essential HTN 10/24/2015  . Hypoglycemia 10/24/2015  . Acute encephalopathy 10/24/2015  . CVA (cerebral vascular accident) ([place]) 10/14/2014  . Prerenal azotemia 10/14/2014  . Mild HTN 10/14/2014  . CVA (cerebral infarction)   . TIA (transient ischemic attack)   . PNAR (perennial non-allergic rhinitis) 05/23/2013  . Seasonal and perennial allergic rhinitis 06/11/2012  . Acute bronchitis due to infection 01/14/2011  . Insomnia, unspecified 05/19/2010  . ATAXIA 03/17/2010  . DIZZINESS 03/06/2010  . Chronic constipation 02/21/2008  . OSTEOARTHRITIS, KNEE, SEVERE 01/12/2008  . HYPERLIPIDEMIA 01/17/2007  . Hypothyroidism (post surgical) 10/15/2006  . SKIN CANCER, HX OF 06/08/2006  . Diabetes mellitus, insulin dependent (IDDM), controlled ([place]) 04/22/2006  . URTICARIA 04/22/2006  . POPLITEAL CYST 04/22/2006  . THYROIDECTOMY, SUBTOTAL, HX OF 04/22/2006  . TOTAL KNEE REPLACEMENT, HX OF 04/22/2006    Past Surgical History:  Procedure Laterality Date  . BACK SURGERY     lumbar fracture  . cataract surgery Bilateral   . COLONOSCOPY W/ POLYPECTOMY     x2   . CYSTOSCOPY WITH INJECTION N/A 09/11/2013   Procedure: CYSTOSCOPY WITH BOTOX INJECTION INTO THE BLADDER;  Surgeon: Ailene Rud, MD;  Location: [facility];  Service: Urology;  Laterality: N/A;  . CYSTOSCOPY WITH RETROGRADE PYELOGRAM, URETEROSCOPY AND STENT PLACEMENT    . CYSTOSCOPY WITH STENT PLACEMENT Right 01/26/2016   Procedure: CYSTOSCOPY, RIGHT RETROGRADE WITH RIGHT URETERAL STENT PLACEMENT;  Surgeon: Cleon Gustin, MD;  Location: [facility];  Service: Urology;  Laterality: Right;  . CYSTOSCOPY/RETROGRADE/URETEROSCOPY Right 04/22/2016   Procedure: CYSTOSCOPY/RETROGRADE/ REMOVAL JJ STENT right insertion stent right insertion of foley;  Surgeon: Franchot Gallo, MD;  Location: [facility];  Service:  Urology;  Laterality: Right;  . EXTRACORPOREAL SHOCK WAVE LITHOTRIPSY Right 02/27/2016   Procedure: RIGHT EXTRACORPOREAL SHOCK WAVE LITHOTRIPSY (ESWL) GAITED;  Surgeon: Cleon Gustin, MD;  Location: [facility];  Service: Urology;  Laterality: Right;  . EXTRACORPOREAL SHOCK WAVE LITHOTRIPSY Right 02/10/2016   Procedure: EXTRACORPOREAL SHOCK WAVE LITHOTRIPSY (ESWL);  Surgeon: Kathie Rhodes, MD;  Location: [facility];  Service: Urology;  Laterality: Right;  [facility]-[phone] [id number] [employer]- [id number]   . EYE SURGERY     macular degeneration  . GALLBLADDER SURGERY  2006   no cholecystectomy  . HERNIA REPAIR    . LUNG REMOVAL, PARTIAL  age 40   for bronchiectasis  . PARTIAL THYMECTOMY  1985   for nodules  . POPLITEAL SYNOVIAL CYST EXCISION  2005  . TOTAL KNEE ARTHROPLASTY  2008   right  . TOTAL KNEE ARTHROPLASTY  2010   left       Home Medications    Prior to Admission medications   Medication Sig Start Date End Date Taking? Authorizing Provider  alfuzosin (UROXATRAL) 10 MG 24 hr tablet Take 1 tablet (10 mg total) by mouth at bedtime. 02/10/16  Yes Kathie Rhodes, MD  aspirin EC 81 MG tablet Take 81 mg by mouth daily.    Yes [provider]  carbidopa-levodopa (SINEMET IR) 25-100 MG tablet Take 1 tablet by mouth 3 (three) times daily.    Yes [provider]  Cholecalciferol (VITAMIN D-3) 5000 units TABS Take 5,000 Units by mouth daily.   Yes [provider]  colesevelam (WELCHOL) 625 MG tablet Take 1,875 mg by mouth 2 (two) times daily with a meal.    Yes [provider]  COMBIGAN 0.2-0.5 % ophthalmic solution Place 1 drop into both eyes every 12 (twelve) hours.    Yes [provider]  diltiazem (CARDIZEM CD) 180 MG 24 hr capsule Take 180 mg by mouth daily.   Yes [provider]  docusate sodium (COLACE) 100 MG capsule Take 100 mg by mouth daily.   Yes [provider]  furosemide (LASIX) 20 MG tablet  Take 20 mg by mouth daily.   Yes [provider]  levothyroxine (SYNTHROID, LEVOTHROID) 200 MCG tablet Take 100 mcg by mouth daily before breakfast. Pt takes with a 23mcg tablet.   Yes [provider]  levothyroxine (SYNTHROID, LEVOTHROID) 75 MCG tablet Take 75 mcg by mouth daily before breakfast. Pt takes with a 149mcg tablet.   Yes [provider]  Meth-Hyo-M Bl-Na Phos-Ph Sal (URIBEL) 118 MG CAPS Take 1 capsule (118 mg total) by mouth 3 (three) times daily as needed (burning). 01/27/16  Yes McKenzie, Candee Furbish, MD  multivitamin-lutein [facility]) CAPS capsule Take 1 capsule by mouth daily.   Yes [provider]  omeprazole (PRILOSEC) 20 MG capsule Take 20 mg by mouth daily.   Yes [provider]  polyethylene glycol (MIRALAX / GLYCOLAX) packet Take 17 g by mouth daily.   Yes [provider]  potassium chloride (K-DUR,KLOR-CON) 10 MEQ tablet Take 10 mEq  by mouth daily.   Yes [provider]  sitaGLIPtin (JANUVIA) 50 MG tablet Take 1 tablet (50 mg total) by mouth daily. 10/25/15  Yes Tat, Shanon Brow, MD  zolpidem (AMBIEN) 5 MG tablet Take 5 mg by mouth at bedtime as needed for sleep.   Yes [provider]  ciprofloxacin (CIPRO) 250 MG tablet Take 1 tablet (250 mg total) by mouth 2 (two) times daily. Patient not taking: Reported on 06/05/2016 04/23/16   Franchot Gallo, MD  mirabegron ER (MYRBETRIQ) 50 MG TB24 tablet Take 1 tablet (50 mg total) by mouth daily. Patient not taking: Reported on 06/05/2016 01/28/16   Cleon Gustin, MD  traMADol (ULTRAM) 50 MG tablet Take 1 tablet (50 mg total) by mouth every 6 (six) hours as needed. Patient not taking: Reported on 06/05/2016 01/26/16   Cleon Gustin, MD    Family History Family History  Problem Relation Age of Onset  . Other Sister        ophth disease  . Heart attack Father        x7  . Heart disease Father        MI x7  . Hypertension Mother   . Stroke Mother 58     Social History Social History  Substance Use Topics  . Smoking status: Never Smoker  . Smokeless tobacco: Never Used  . Alcohol use No     Allergies   Colchicine; Hibiclens [chlorhexidine]; Atorvastatin; Ceftin; Celebrex [celecoxib]; Codeine; Contrast media [iodinated diagnostic agents]; Erythromycin; Ezetimibe; Oxycodone-acetaminophen; Rosuvastatin; Simvastatin; Cefpodoxime; Cefuroxime axetil; Nitroglycerin; Tetanus toxoid; and Vantin   Review of Systems Review of Systems  Constitutional: Negative for chills and fever.  Skin: Positive for wound (right elbow).  All other systems reviewed and are negative.    Physical Exam Updated Vital Signs BP (!) 165/95 (BP Location: Left Arm)   Pulse 72   Temp 98 F (36.7 C)   Resp 18   SpO2 98%   Physical Exam  Constitutional: He is oriented to person, place, and time. He appears well-developed and well-nourished. No distress.  Well appearing  HENT:  Head: Normocephalic and atraumatic.  Nose: Nose normal.  Mouth/Throat: Oropharynx is clear and moist.  No evidence of wound, redness, swelling, or TTP on exam.   Eyes: Conjunctivae and EOM are normal. Pupils are equal, round, and reactive to light.  Neck: Normal range of motion. No JVD present.  Normal ROM, no neck tenderness. No nuchal rigidity  Cardiovascular: Normal rate, normal heart sounds and intact distal pulses.   No murmur heard. Pulmonary/Chest: Effort normal and breath sounds normal. No stridor. No respiratory distress. He has no wheezes. He has no rales.  Normal work of breathing. No respiratory distress noted.   Abdominal: Soft. Bowel sounds are normal. There is no tenderness. There is no rebound and no guarding.  Soft and nontender. No rebound or guarding. No pulsatile mass noted.   Musculoskeletal: Normal range of motion. He exhibits no tenderness or deformity.  No midline cervical, thoracic, or lumbar tenderness. Good ROM of spine. No deformity. No obvious wound,  redness, or swelling noted.  Right elbow with significant swelling about 3 cm in diameter. Small laceration about 46mm in length with minimal bleeding. Little to no TTP. No surrounding erythema. No obvious body deformity. FROM of right elbow.  No wounds, redness, deformtiy or TTP to shoulders, wrists, fingers, hips, pelvis, knees bilaterally. No wounds redness, deformity to left elbow.   Neurological: He is alert and oriented to person,  place, and time.  Cranial Nerves:  III,IV, VI: ptosis not present, extra-ocular movements intact bilaterally, direct and consensual pupillary light reflexes intact bilaterally V: facial sensation, jaw opening, and bite strength equal bilaterally VII: eyebrow raise, eyelid close, smile, frown, pucker equal bilaterally VIII: hearing grossly normal bilaterally  IX,X: palate elevation and swallowing intact XI: bilateral shoulder shrug and lateral head rotation equal and strong XII: midline tongue extension  Negative pronator drift, negative Romberg, negative RAM's, negative heel-to-shin, negative finger to nose.    Sensory intact.  Muscle strength 5/5  Pt able to stand and ambulate with assistance. He states he normally used a walker and is his baseline.   Skin: Skin is warm. Capillary refill takes less than 2 seconds.  Pt with bandages around lower extremities that appears recently changed. No TTP.   Psychiatric: He has a normal mood and affect. His behavior is normal.  Nursing note and vitals reviewed.    ED Treatments / Results  Labs (all labs ordered are listed, but only abnormal results are displayed) Labs Reviewed - No data to display  EKG  EKG Interpretation None       Radiology Dg Elbow Complete Right  Result Date: 06/05/2016 CLINICAL DATA:  Right elbow pain after fall EXAM: RIGHT ELBOW - COMPLETE 3+ VIEW COMPARISON:  None. FINDINGS: Mild fat pad distention suggestive of small effusion. Possible subtle nondisplaced fracture at the junction  of the head and neck of the radius. Soft tissue swelling over the olecranon. IMPRESSION: 1. Suspect small elbow effusion. Possible subtle nondisplaced fracture at the junction of the radial head and neck. No dislocation Electronically Signed   By: Donavan Foil M.D.   On: 06/05/2016 18:55   Ct Head Wo Contrast  Result Date: 06/05/2016 CLINICAL DATA:  Gait disturbance with fall. Posterior head contusion. EXAM: CT HEAD WITHOUT CONTRAST CT CERVICAL SPINE WITHOUT CONTRAST TECHNIQUE: Multidetector CT imaging of the head and cervical spine was performed following the standard protocol without intravenous contrast. Multiplanar CT image reconstructions of the cervical spine were also generated. COMPARISON:  CT head 10/24/2015.  MRI brain 10/14/2014. FINDINGS: CT HEAD FINDINGS Brain: There is no evidence of acute intracranial hemorrhage, mass lesion, brain edema or extra-axial fluid collection. There is stable generalized atrophy with patchy low-density in the periventricular and subcortical white matter bilaterally. There is a stable asymmetric fat in the left insular cortex. There is no CT evidence of acute cortical infarction. Vascular: Intracranial vascular calcifications are present. Skull: Negative for fracture or focal lesion. Sinuses/Orbits: The visualized paranasal sinuses and mastoid air cells are clear. No orbital abnormalities are seen. Other: None. CT CERVICAL SPINE FINDINGS Alignment: Near anatomic. There is no focal angulation or significant listhesis. Skull base and vertebrae: No evidence of acute fracture. There is multilevel spondylosis with facet hypertrophy. The facet joints appear ankylosed bilaterally at C3-4 and on the right at C4-5. There is prominent ossification of the ligamentum nuchae. Soft tissues and spinal canal: No prevertebral fluid or swelling. No visible canal hematoma. Carotid atherosclerosis present bilaterally. Disc levels: There is multilevel spondylosis with discal calcification,  uncinate spurring and facet hypertrophy. Mild-to-moderate foraminal narrowing, greatest on the right at C5-6 and on the left at C6-7. Upper chest: No acute or significant findings. Other: None. IMPRESSION: 1. No acute intracranial or calvarial findings. 2. Stable atrophy and chronic small vessel ischemic changes. 3. No evidence of acute cervical spine fracture, traumatic subluxation or static signs of instability. 4. Multilevel cervical spondylosis as described. Electronically Signed  By: Richardean Sale M.D.   On: 06/05/2016 19:10   Ct Cervical Spine Wo Contrast  Result Date: 06/05/2016 CLINICAL DATA:  Gait disturbance with fall. Posterior head contusion. EXAM: CT HEAD WITHOUT CONTRAST CT CERVICAL SPINE WITHOUT CONTRAST TECHNIQUE: Multidetector CT imaging of the head and cervical spine was performed following the standard protocol without intravenous contrast. Multiplanar CT image reconstructions of the cervical spine were also generated. COMPARISON:  CT head 10/24/2015.  MRI brain 10/14/2014. FINDINGS: CT HEAD FINDINGS Brain: There is no evidence of acute intracranial hemorrhage, mass lesion, brain edema or extra-axial fluid collection. There is stable generalized atrophy with patchy low-density in the periventricular and subcortical white matter bilaterally. There is a stable asymmetric fat in the left insular cortex. There is no CT evidence of acute cortical infarction. Vascular: Intracranial vascular calcifications are present. Skull: Negative for fracture or focal lesion. Sinuses/Orbits: The visualized paranasal sinuses and mastoid air cells are clear. No orbital abnormalities are seen. Other: None. CT CERVICAL SPINE FINDINGS Alignment: Near anatomic. There is no focal angulation or significant listhesis. Skull base and vertebrae: No evidence of acute fracture. There is multilevel spondylosis with facet hypertrophy. The facet joints appear ankylosed bilaterally at C3-4 and on the right at C4-5. There is  prominent ossification of the ligamentum nuchae. Soft tissues and spinal canal: No prevertebral fluid or swelling. No visible canal hematoma. Carotid atherosclerosis present bilaterally. Disc levels: There is multilevel spondylosis with discal calcification, uncinate spurring and facet hypertrophy. Mild-to-moderate foraminal narrowing, greatest on the right at C5-6 and on the left at C6-7. Upper chest: No acute or significant findings. Other: None. IMPRESSION: 1. No acute intracranial or calvarial findings. 2. Stable atrophy and chronic small vessel ischemic changes. 3. No evidence of acute cervical spine fracture, traumatic subluxation or static signs of instability. 4. Multilevel cervical spondylosis as described. Electronically Signed   By: Richardean Sale M.D.   On: 06/05/2016 19:10    Procedures Procedures (including critical care time)  Medications Ordered in ED Medications - No data to display   Initial Impression / Assessment and Plan / ED Course  I have reviewed the triage vital signs and the nursing notes.  Pertinent labs & imaging results that were available during my care of the patient were reviewed by me and considered in my medical decision making (see chart for details).     Patient X-Ray with apparent possible right radial head fx. CT of head and neck negative for any acute intracranial or acute cervical neck findings. Pt has no pain or other symptoms here. Pt advised to follow up with hand surgery and PCP for further evaluation and treatment.   Patient given elbow wrap while in ED, conservative therapy recommended and discussed. Patient will be dc home & is agreeable with above plan. I have also discussed reasons to return immediately to the ER.  Patient expresses understanding and agrees with plan. CBG by ems found to be 154.  Pt also seen and evaluated by Dr. Maryan Rued who agrees with assessment and plan.    Final Clinical Impressions(s) / ED Diagnoses   Final diagnoses:    Fall, initial encounter  Closed nondisplaced fracture of head of right radius, initial encounter    New Prescriptions Discharge Medication List as of 06/05/2016  8:38 PM       Bettey Costa, [place] 06/05/16 [place], [place], [place] 06/05/16 2209    Blanchie Dessert, MD 06/06/16 [phone]

## 2016-06-05 NOTE — ED Notes (Signed)
Patient transported to X-ray 

## 2016-06-05 NOTE — ED Notes (Signed)
Bed: YE33 Expected date:  Expected time:  Means of arrival:  Comments: Fall, head injury

## 2016-06-05 NOTE — ED Notes (Signed)
EDPA Provider at bedside. 

## 2016-06-05 NOTE — ED Notes (Signed)
Report given to facility.

## 2016-06-05 NOTE — ED Notes (Signed)
ICE APPLIED TO RT ELBOW

## 2016-06-05 NOTE — ED Triage Notes (Signed)
Per [facility]- Pt resides at [facility]. FULL CODE. Witness fall by wife. No LOC. Denies neck and back pain. Pt has loss of gait and fell backwards. Pt stated "due to his stockings on his legs" Contusion to posterior head and skin abrasion to rt elbow. Abrasion dressed by facility. [place] cleared. No other complaints.

## 2016-06-06 ENCOUNTER — Emergency Department (HOSPITAL_COMMUNITY): Payer: Medicare Other

## 2016-06-06 ENCOUNTER — Emergency Department (HOSPITAL_COMMUNITY)
Admission: EM | Admit: 2016-06-06 | Discharge: 2016-06-06 | Disposition: A | Discharge status: Home / Self Care | Payer: Medicare Other | Attending: Emergency Medicine | Admitting: Emergency Medicine

## 2016-06-06 ENCOUNTER — Encounter (HOSPITAL_COMMUNITY): Payer: Self-pay | Admitting: Emergency Medicine

## 2016-06-06 DIAGNOSIS — Z7982 Long term (current) use of aspirin: Secondary | ICD-10-CM | POA: Insufficient documentation

## 2016-06-06 DIAGNOSIS — N189 Chronic kidney disease, unspecified: Secondary | ICD-10-CM | POA: Diagnosis not present

## 2016-06-06 DIAGNOSIS — Z79899 Other long term (current) drug therapy: Secondary | ICD-10-CM | POA: Insufficient documentation

## 2016-06-06 DIAGNOSIS — E1122 Type 2 diabetes mellitus with diabetic chronic kidney disease: Secondary | ICD-10-CM | POA: Diagnosis not present

## 2016-06-06 DIAGNOSIS — Z8546 Personal history of malignant neoplasm of prostate: Secondary | ICD-10-CM | POA: Diagnosis not present

## 2016-06-06 DIAGNOSIS — Z85828 Personal history of other malignant neoplasm of skin: Secondary | ICD-10-CM | POA: Insufficient documentation

## 2016-06-06 DIAGNOSIS — Z96653 Presence of artificial knee joint, bilateral: Secondary | ICD-10-CM | POA: Insufficient documentation

## 2016-06-06 DIAGNOSIS — E039 Hypothyroidism, unspecified: Secondary | ICD-10-CM | POA: Diagnosis not present

## 2016-06-06 DIAGNOSIS — Z8673 Personal history of transient ischemic attack (TIA), and cerebral infarction without residual deficits: Secondary | ICD-10-CM | POA: Diagnosis not present

## 2016-06-06 DIAGNOSIS — M25511 Pain in right shoulder: Secondary | ICD-10-CM | POA: Diagnosis not present

## 2016-06-06 NOTE — ED Triage Notes (Signed)
Pt reports sharp r/shouklder pain x 6 hours. Pt fell yesterday and was examined for elbow and head trauma. Pt took 1 Tramadol this am.for pain. Pt is alert , oriented and appropriate. Difficulty ambulating due to Parkinson's. Family at bedside

## 2016-06-06 NOTE — Discharge Instructions (Signed)
Ice on and off several times a day. Continue current pain medications. Follow up with orthopedics as referred on prior paper work.

## 2016-06-06 NOTE — ED Provider Notes (Signed)
[facility] DEPT Provider Note   CSN: [id number] Arrival date & time: 06/06/16  0945     History   Chief Complaint Chief Complaint  Patient presents with  . Shoulder Pain    HPI [name] is a 83 y.o. male.  HPI [name] is a 83 y.o. male with history of Parkinson's disease, asthma, diabetes, CVA, presents to emergency department complaining of right shoulder pain. Patient states he had a mechanical fall yesterday while at his assisted living facility. He was seen in emergency department at that time and had a CT of his head and cervical spine done as well as a right elbow x-rays. X-ray of the elbow show possible nondisplaced fracture. He was splinted and discharged home with tramadol for pain. Patient states he is unable to sleep last night due to a new pain in his right shoulder. He took tramadol which has not helped. Patient denies any new injuries. No numbness or weakness in his hand. He states pain in his shoulders worsened with movement.  Past Medical History:  Diagnosis Date  . Allergic rhinitis   . Arthritis    arthritis-kyphosis  . Asthmatic bronchitis   . Chronic kidney disease   . DM type 2 (diabetes mellitus, type 2) ([place])   . DVT, lower extremity, proximal, acute ([place])   . Dyslipidemia   . Dysrhythmia    '97 tachycardia- no recent issues  . GERD (gastroesophageal reflux disease)   . Hearing impaired    bilateral hearing aids  . History of kidney stones   . History of skin cancer   . Hypertension   . Hypothyroidism   . Macular degeneration    injections done bilaterally recent - 08-22-13  . Prostate cancer [facility])    Prostate cancer-radiation only,skin cancer-basal cell and last squamous cell right eye  . Stroke ([place])   . Urgency-frequency syndrome   . Urticaria     Patient Active Problem List   Diagnosis Date Noted  . Retained ureteral stent 04/22/2016  . Ureteral calculus 01/25/2016  . Mental status change 10/24/2015  . Encephalopathy  10/24/2015  . Hypothermia 10/24/2015  . Chronic renal insufficiency 10/24/2015  . Benign essential HTN 10/24/2015  . Hypoglycemia 10/24/2015  . Acute encephalopathy 10/24/2015  . CVA (cerebral vascular accident) ([place]) 10/14/2014  . Prerenal azotemia 10/14/2014  . Mild HTN 10/14/2014  . CVA (cerebral infarction)   . TIA (transient ischemic attack)   . PNAR (perennial non-allergic rhinitis) 05/23/2013  . Seasonal and perennial allergic rhinitis 06/11/2012  . Acute bronchitis due to infection 01/14/2011  . Insomnia, unspecified 05/19/2010  . ATAXIA 03/17/2010  . DIZZINESS 03/06/2010  . Chronic constipation 02/21/2008  . OSTEOARTHRITIS, KNEE, SEVERE 01/12/2008  . HYPERLIPIDEMIA 01/17/2007  . Hypothyroidism (post surgical) 10/15/2006  . SKIN CANCER, HX OF 06/08/2006  . Diabetes mellitus, insulin dependent (IDDM), controlled ([place]) 04/22/2006  . URTICARIA 04/22/2006  . POPLITEAL CYST 04/22/2006  . THYROIDECTOMY, SUBTOTAL, HX OF 04/22/2006  . TOTAL KNEE REPLACEMENT, HX OF 04/22/2006    Past Surgical History:  Procedure Laterality Date  . BACK SURGERY     lumbar fracture  . cataract surgery Bilateral   . COLONOSCOPY W/ POLYPECTOMY     x2   . CYSTOSCOPY WITH INJECTION N/A 09/11/2013   Procedure: CYSTOSCOPY WITH BOTOX INJECTION INTO THE BLADDER;  Surgeon: Ailene Rud, MD;  Location: [facility];  Service: Urology;  Laterality: N/A;  . CYSTOSCOPY WITH RETROGRADE PYELOGRAM, URETEROSCOPY AND STENT PLACEMENT    .  CYSTOSCOPY WITH STENT PLACEMENT Right 01/26/2016   Procedure: CYSTOSCOPY, RIGHT RETROGRADE WITH RIGHT URETERAL STENT PLACEMENT;  Surgeon: Cleon Gustin, MD;  Location: [facility];  Service: Urology;  Laterality: Right;  . CYSTOSCOPY/RETROGRADE/URETEROSCOPY Right 04/22/2016   Procedure: CYSTOSCOPY/RETROGRADE/ REMOVAL JJ STENT right insertion stent right insertion of foley;  Surgeon: Franchot Gallo, MD;  Location: [facility];  Service: Urology;  Laterality: Right;  .  EXTRACORPOREAL SHOCK WAVE LITHOTRIPSY Right 02/27/2016   Procedure: RIGHT EXTRACORPOREAL SHOCK WAVE LITHOTRIPSY (ESWL) GAITED;  Surgeon: Cleon Gustin, MD;  Location: [facility];  Service: Urology;  Laterality: Right;  . EXTRACORPOREAL SHOCK WAVE LITHOTRIPSY Right 02/10/2016   Procedure: EXTRACORPOREAL SHOCK WAVE LITHOTRIPSY (ESWL);  Surgeon: Kathie Rhodes, MD;  Location: [facility];  Service: Urology;  Laterality: Right;  [facility]-[phone] [id number] A [employer]- [id number]   . EYE SURGERY     macular degeneration  . GALLBLADDER SURGERY  2006   no cholecystectomy  . HERNIA REPAIR    . LUNG REMOVAL, PARTIAL  age 33   for bronchiectasis  . PARTIAL THYMECTOMY  1985   for nodules  . POPLITEAL SYNOVIAL CYST EXCISION  2005  . TOTAL KNEE ARTHROPLASTY  2008   right  . TOTAL KNEE ARTHROPLASTY  2010   left       Home Medications    Prior to Admission medications   Medication Sig Start Date End Date Taking? Authorizing Provider  alfuzosin (UROXATRAL) 10 MG 24 hr tablet Take 1 tablet (10 mg total) by mouth at bedtime. 02/10/16   Kathie Rhodes, MD  aspirin EC 81 MG tablet Take 81 mg by mouth daily.     [provider]  carbidopa-levodopa (SINEMET IR) 25-100 MG tablet Take 1 tablet by mouth 3 (three) times daily.     [provider]  Cholecalciferol (VITAMIN D-3) 5000 units TABS Take 5,000 Units by mouth daily.    [provider]  ciprofloxacin (CIPRO) 250 MG tablet Take 1 tablet (250 mg total) by mouth 2 (two) times daily. Patient not taking: Reported on 06/05/2016 04/23/16   Franchot Gallo, MD  colesevelam [facility]) 625 MG tablet Take 1,875 mg by mouth 2 (two) times daily with a meal.     [provider]  COMBIGAN 0.2-0.5 % ophthalmic solution Place 1 drop into both eyes every 12 (twelve) hours.     [provider]  diltiazem (CARDIZEM CD) 180 MG 24 hr capsule Take 180 mg by mouth daily.    [provider]  docusate  sodium (COLACE) 100 MG capsule Take 100 mg by mouth daily.    [provider]  furosemide (LASIX) 20 MG tablet Take 20 mg by mouth daily.    [provider]  levothyroxine (SYNTHROID, LEVOTHROID) 200 MCG tablet Take 100 mcg by mouth daily before breakfast. Pt takes with a 74mcg tablet.    [provider]  levothyroxine (SYNTHROID, LEVOTHROID) 75 MCG tablet Take 75 mcg by mouth daily before breakfast. Pt takes with a 166mcg tablet.    [provider]  Meth-Hyo-M Bl-Na Phos-Ph Sal (URIBEL) 118 MG CAPS Take 1 capsule (118 mg total) by mouth 3 (three) times daily as needed (burning). 01/27/16   McKenzie, Candee Furbish, MD  mirabegron ER (MYRBETRIQ) 50 MG TB24 tablet Take 1 tablet (50 mg total) by mouth daily. Patient not taking: Reported on 06/05/2016 01/28/16   Cleon Gustin, MD  multivitamin-lutein [facility]) CAPS capsule Take 1 capsule by mouth daily.    [provider]  omeprazole ([place])  20 MG capsule Take 20 mg by mouth daily.    [provider]  polyethylene glycol (MIRALAX / GLYCOLAX) packet Take 17 g by mouth daily.    [provider]  potassium chloride (K-DUR,KLOR-CON) 10 MEQ tablet Take 10 mEq by mouth daily.    [provider]  sitaGLIPtin (JANUVIA) 50 MG tablet Take 1 tablet (50 mg total) by mouth daily. 10/25/15   Orson Eva, MD  traMADol (ULTRAM) 50 MG tablet Take 1 tablet (50 mg total) by mouth every 6 (six) hours as needed. Patient not taking: Reported on 06/05/2016 01/26/16   Cleon Gustin, MD  zolpidem (AMBIEN) 5 MG tablet Take 5 mg by mouth at bedtime as needed for sleep.    [provider]    Family History Family History  Problem Relation Age of Onset  . Other Sister        ophth disease  . Heart attack Father        x7  . Heart disease Father        MI x7  . Hypertension Mother   . Stroke Mother 31    Social History Social History  Substance Use Topics  . Smoking status:  Never Smoker  . Smokeless tobacco: Never Used  . Alcohol use No     Allergies   Colchicine; Hibiclens [chlorhexidine]; Atorvastatin; Ceftin; Celebrex [celecoxib]; Codeine; Contrast media [iodinated diagnostic agents]; Erythromycin; Ezetimibe; Oxycodone-acetaminophen; Rosuvastatin; Simvastatin; Cefpodoxime; Cefuroxime axetil; Nitroglycerin; Tetanus toxoid; and Vantin   Review of Systems Review of Systems  Constitutional: Negative for chills and fever.  Respiratory: Negative for cough, chest tightness and shortness of breath.   Cardiovascular: Negative for chest pain, palpitations and leg swelling.  Musculoskeletal: Positive for arthralgias and myalgias. Negative for joint swelling, neck pain and neck stiffness.  Skin: Negative for rash.  Allergic/Immunologic: Negative for immunocompromised state.  Neurological: Negative for dizziness, weakness, light-headedness, numbness and headaches.  All other systems reviewed and are negative.    Physical Exam Updated Vital Signs BP 120/68 (BP Location: Left Arm)   Pulse 75   Resp 17   Wt 68 kg (150 lb)   SpO2 100%   BMI 23.49 kg/m   Physical Exam  Constitutional: He appears well-developed and well-nourished. No distress.  HENT:  Head: Normocephalic and atraumatic.  Eyes: Conjunctivae are normal.  Neck: Neck supple.  Cardiovascular: Normal rate, regular rhythm and normal heart sounds.   Pulmonary/Chest: Effort normal. No respiratory distress. He has no wheezes. He has no rales.  Musculoskeletal: He exhibits no edema.  Patient is wearing a right elbow splint. His right shoulder appears to be normal with no obvious bruising, swelling, deformity. Mild tenderness over anterior joint. No tenderness over her acromion, before meals joint, posterior joint. Full range of motion of the shoulder with minimal pain. Distal radial pulses intact. Hand is warm, pink, capillary refill less than 2 seconds.  Neurological: He is alert.  Skin: Skin is warm  and dry.  Nursing note and vitals reviewed.    ED Treatments / Results  Labs (all labs ordered are listed, but only abnormal results are displayed) Labs Reviewed - No data to display  EKG  EKG Interpretation None       Radiology Dg Shoulder Right  Result Date: 06/06/2016 CLINICAL DATA:  Fall yesterday.  Increased right shoulder pain. EXAM: RIGHT SHOULDER - 2+ VIEW COMPARISON:  None. FINDINGS: There is no evidence of acute fracture or dislocation. Faint chondrocalcinosis is noted along the superior humeral head.  There is moderate AC joint osteoarthrosis. The right lung is grossly clear. IMPRESSION: No acute osseous abnormality identified. Faint chondrocalcinosis and moderate AC joint osteoarthrosis. Electronically Signed   By: Logan Bores M.D.   On: 06/06/2016 11:00   Dg Elbow Complete Right  Result Date: 06/05/2016 CLINICAL DATA:  Right elbow pain after fall EXAM: RIGHT ELBOW - COMPLETE 3+ VIEW COMPARISON:  None. FINDINGS: Mild fat pad distention suggestive of small effusion. Possible subtle nondisplaced fracture at the junction of the head and neck of the radius. Soft tissue swelling over the olecranon. IMPRESSION: 1. Suspect small elbow effusion. Possible subtle nondisplaced fracture at the junction of the radial head and neck. No dislocation Electronically Signed   By: Donavan Foil M.D.   On: 06/05/2016 18:55   Ct Head Wo Contrast  Result Date: 06/05/2016 CLINICAL DATA:  Gait disturbance with fall. Posterior head contusion. EXAM: CT HEAD WITHOUT CONTRAST CT CERVICAL SPINE WITHOUT CONTRAST TECHNIQUE: Multidetector CT imaging of the head and cervical spine was performed following the standard protocol without intravenous contrast. Multiplanar CT image reconstructions of the cervical spine were also generated. COMPARISON:  CT head 10/24/2015.  MRI brain 10/14/2014. FINDINGS: CT HEAD FINDINGS Brain: There is no evidence of acute intracranial hemorrhage, mass lesion, brain edema or  extra-axial fluid collection. There is stable generalized atrophy with patchy low-density in the periventricular and subcortical white matter bilaterally. There is a stable asymmetric fat in the left insular cortex. There is no CT evidence of acute cortical infarction. Vascular: Intracranial vascular calcifications are present. Skull: Negative for fracture or focal lesion. Sinuses/Orbits: The visualized paranasal sinuses and mastoid air cells are clear. No orbital abnormalities are seen. Other: None. CT CERVICAL SPINE FINDINGS Alignment: Near anatomic. There is no focal angulation or significant listhesis. Skull base and vertebrae: No evidence of acute fracture. There is multilevel spondylosis with facet hypertrophy. The facet joints appear ankylosed bilaterally at C3-4 and on the right at C4-5. There is prominent ossification of the ligamentum nuchae. Soft tissues and spinal canal: No prevertebral fluid or swelling. No visible canal hematoma. Carotid atherosclerosis present bilaterally. Disc levels: There is multilevel spondylosis with discal calcification, uncinate spurring and facet hypertrophy. Mild-to-moderate foraminal narrowing, greatest on the right at C5-6 and on the left at C6-7. Upper chest: No acute or significant findings. Other: None. IMPRESSION: 1. No acute intracranial or calvarial findings. 2. Stable atrophy and chronic small vessel ischemic changes. 3. No evidence of acute cervical spine fracture, traumatic subluxation or static signs of instability. 4. Multilevel cervical spondylosis as described. Electronically Signed   By: Richardean Sale M.D.   On: 06/05/2016 19:10   Ct Cervical Spine Wo Contrast  Result Date: 06/05/2016 CLINICAL DATA:  Gait disturbance with fall. Posterior head contusion. EXAM: CT HEAD WITHOUT CONTRAST CT CERVICAL SPINE WITHOUT CONTRAST TECHNIQUE: Multidetector CT imaging of the head and cervical spine was performed following the standard protocol without intravenous  contrast. Multiplanar CT image reconstructions of the cervical spine were also generated. COMPARISON:  CT head 10/24/2015.  MRI brain 10/14/2014. FINDINGS: CT HEAD FINDINGS Brain: There is no evidence of acute intracranial hemorrhage, mass lesion, brain edema or extra-axial fluid collection. There is stable generalized atrophy with patchy low-density in the periventricular and subcortical white matter bilaterally. There is a stable asymmetric fat in the left insular cortex. There is no CT evidence of acute cortical infarction. Vascular: Intracranial vascular calcifications are present. Skull: Negative for fracture or focal lesion. Sinuses/Orbits: The visualized paranasal sinuses and mastoid air  cells are clear. No orbital abnormalities are seen. Other: None. CT CERVICAL SPINE FINDINGS Alignment: Near anatomic. There is no focal angulation or significant listhesis. Skull base and vertebrae: No evidence of acute fracture. There is multilevel spondylosis with facet hypertrophy. The facet joints appear ankylosed bilaterally at C3-4 and on the right at C4-5. There is prominent ossification of the ligamentum nuchae. Soft tissues and spinal canal: No prevertebral fluid or swelling. No visible canal hematoma. Carotid atherosclerosis present bilaterally. Disc levels: There is multilevel spondylosis with discal calcification, uncinate spurring and facet hypertrophy. Mild-to-moderate foraminal narrowing, greatest on the right at C5-6 and on the left at C6-7. Upper chest: No acute or significant findings. Other: None. IMPRESSION: 1. No acute intracranial or calvarial findings. 2. Stable atrophy and chronic small vessel ischemic changes. 3. No evidence of acute cervical spine fracture, traumatic subluxation or static signs of instability. 4. Multilevel cervical spondylosis as described. Electronically Signed   By: Richardean Sale M.D.   On: 06/05/2016 19:10    Procedures Procedures (including critical care  time)  Medications Ordered in ED Medications - No data to display   Initial Impression / Assessment and Plan / ED Course  I have reviewed the triage vital signs and the nursing notes.  Pertinent labs & imaging results that were available during my care of the patient were reviewed by me and considered in my medical decision making (see chart for details).    Patient emergency department with right shoulder pain, fall yesterday. He was evaluated in emergency department after the fall last night and had negative CT head and cervical spine, nondisplaced fracture noted on the right elbow and he was splinted. He stated that he did not have pain in his right shoulder that time but could not sleep all night due to increased pain. X-rays here demonstrate no acute fracture. Patient states his pain is now better. He would like to be discharged home.  Discussed with Dr. Ellender Hose, who has seen pt, agrees.   Vitals:   06/06/16 0955 06/06/16 1015 06/06/16 1246  BP: 120/68  122/66  Pulse: 75  74  Resp: 17  18  TempSrc: Oral    SpO2: 100%  100%  Weight:  68 kg (150 lb)       Final Clinical Impressions(s) / ED Diagnoses   Final diagnoses:  Acute pain of right shoulder    New Prescriptions Discharge Medication List as of 06/06/2016 12:13 PM       Jeannett Senior, PA-C 06/06/16 1441    Duffy Bruce, MD 06/07/16 1912

## 2016-06-06 NOTE — ED Notes (Signed)
Bed: WTR5 Expected date:  Expected time:  Means of arrival:  Comments: 

## 2016-06-08 DIAGNOSIS — K219 Gastro-esophageal reflux disease without esophagitis: Secondary | ICD-10-CM | POA: Diagnosis not present

## 2016-06-08 DIAGNOSIS — I10 Essential (primary) hypertension: Secondary | ICD-10-CM | POA: Diagnosis not present

## 2016-06-08 DIAGNOSIS — R638 Other symptoms and signs concerning food and fluid intake: Secondary | ICD-10-CM | POA: Diagnosis not present

## 2016-06-08 DIAGNOSIS — G20 Parkinson's disease: Secondary | ICD-10-CM | POA: Diagnosis not present

## 2016-06-08 DIAGNOSIS — E119 Type 2 diabetes mellitus without complications: Secondary | ICD-10-CM | POA: Diagnosis not present

## 2016-06-08 DIAGNOSIS — R600 Localized edema: Secondary | ICD-10-CM | POA: Diagnosis not present

## 2016-06-09 DIAGNOSIS — R05 Cough: Secondary | ICD-10-CM | POA: Diagnosis not present

## 2016-06-09 DIAGNOSIS — I10 Essential (primary) hypertension: Secondary | ICD-10-CM | POA: Diagnosis not present

## 2016-06-09 DIAGNOSIS — G20 Parkinson's disease: Secondary | ICD-10-CM | POA: Diagnosis not present

## 2016-06-09 DIAGNOSIS — H409 Unspecified glaucoma: Secondary | ICD-10-CM | POA: Diagnosis not present

## 2016-06-09 DIAGNOSIS — N400 Enlarged prostate without lower urinary tract symptoms: Secondary | ICD-10-CM | POA: Diagnosis not present

## 2016-06-09 DIAGNOSIS — K219 Gastro-esophageal reflux disease without esophagitis: Secondary | ICD-10-CM | POA: Diagnosis not present

## 2016-06-09 DIAGNOSIS — K5900 Constipation, unspecified: Secondary | ICD-10-CM | POA: Diagnosis not present

## 2016-06-09 DIAGNOSIS — R600 Localized edema: Secondary | ICD-10-CM | POA: Diagnosis not present

## 2016-06-09 DIAGNOSIS — W1839XA Other fall on same level, initial encounter: Secondary | ICD-10-CM | POA: Diagnosis not present

## 2016-06-09 DIAGNOSIS — E039 Hypothyroidism, unspecified: Secondary | ICD-10-CM | POA: Diagnosis not present

## 2016-06-09 DIAGNOSIS — E559 Vitamin D deficiency, unspecified: Secondary | ICD-10-CM | POA: Diagnosis not present

## 2016-06-09 DIAGNOSIS — G4700 Insomnia, unspecified: Secondary | ICD-10-CM | POA: Diagnosis not present

## 2016-06-09 DIAGNOSIS — E119 Type 2 diabetes mellitus without complications: Secondary | ICD-10-CM | POA: Diagnosis not present

## 2016-06-11 DIAGNOSIS — E119 Type 2 diabetes mellitus without complications: Secondary | ICD-10-CM | POA: Diagnosis not present

## 2016-06-11 DIAGNOSIS — R600 Localized edema: Secondary | ICD-10-CM | POA: Diagnosis not present

## 2016-06-11 DIAGNOSIS — R638 Other symptoms and signs concerning food and fluid intake: Secondary | ICD-10-CM | POA: Diagnosis not present

## 2016-06-11 DIAGNOSIS — K219 Gastro-esophageal reflux disease without esophagitis: Secondary | ICD-10-CM | POA: Diagnosis not present

## 2016-06-11 DIAGNOSIS — G20 Parkinson's disease: Secondary | ICD-10-CM | POA: Diagnosis not present

## 2016-06-11 DIAGNOSIS — I10 Essential (primary) hypertension: Secondary | ICD-10-CM | POA: Diagnosis not present

## 2016-06-12 DIAGNOSIS — G20 Parkinson's disease: Secondary | ICD-10-CM | POA: Diagnosis not present

## 2016-06-12 DIAGNOSIS — R638 Other symptoms and signs concerning food and fluid intake: Secondary | ICD-10-CM | POA: Diagnosis not present

## 2016-06-12 DIAGNOSIS — S50311A Abrasion of right elbow, initial encounter: Secondary | ICD-10-CM | POA: Diagnosis not present

## 2016-06-12 DIAGNOSIS — R600 Localized edema: Secondary | ICD-10-CM | POA: Diagnosis not present

## 2016-06-12 DIAGNOSIS — M19021 Primary osteoarthritis, right elbow: Secondary | ICD-10-CM | POA: Diagnosis not present

## 2016-06-12 DIAGNOSIS — E119 Type 2 diabetes mellitus without complications: Secondary | ICD-10-CM | POA: Diagnosis not present

## 2016-06-12 DIAGNOSIS — I10 Essential (primary) hypertension: Secondary | ICD-10-CM | POA: Diagnosis not present

## 2016-06-12 DIAGNOSIS — K219 Gastro-esophageal reflux disease without esophagitis: Secondary | ICD-10-CM | POA: Diagnosis not present

## 2016-06-12 NOTE — Addendum Note (Signed)
Addendum  created 06/12/16 1213 by Effie Berkshire, MD   Sign clinical note

## 2016-06-15 DIAGNOSIS — R638 Other symptoms and signs concerning food and fluid intake: Secondary | ICD-10-CM | POA: Diagnosis not present

## 2016-06-15 DIAGNOSIS — R600 Localized edema: Secondary | ICD-10-CM | POA: Diagnosis not present

## 2016-06-15 DIAGNOSIS — E119 Type 2 diabetes mellitus without complications: Secondary | ICD-10-CM | POA: Diagnosis not present

## 2016-06-15 DIAGNOSIS — G20 Parkinson's disease: Secondary | ICD-10-CM | POA: Diagnosis not present

## 2016-06-15 DIAGNOSIS — I10 Essential (primary) hypertension: Secondary | ICD-10-CM | POA: Diagnosis not present

## 2016-06-15 DIAGNOSIS — K219 Gastro-esophageal reflux disease without esophagitis: Secondary | ICD-10-CM | POA: Diagnosis not present

## 2016-06-17 DIAGNOSIS — E119 Type 2 diabetes mellitus without complications: Secondary | ICD-10-CM | POA: Diagnosis not present

## 2016-06-17 DIAGNOSIS — I10 Essential (primary) hypertension: Secondary | ICD-10-CM | POA: Diagnosis not present

## 2016-06-17 DIAGNOSIS — R638 Other symptoms and signs concerning food and fluid intake: Secondary | ICD-10-CM | POA: Diagnosis not present

## 2016-06-17 DIAGNOSIS — G20 Parkinson's disease: Secondary | ICD-10-CM | POA: Diagnosis not present

## 2016-06-17 DIAGNOSIS — R600 Localized edema: Secondary | ICD-10-CM | POA: Diagnosis not present

## 2016-06-17 DIAGNOSIS — K219 Gastro-esophageal reflux disease without esophagitis: Secondary | ICD-10-CM | POA: Diagnosis not present

## 2016-06-18 DIAGNOSIS — G20 Parkinson's disease: Secondary | ICD-10-CM | POA: Diagnosis not present

## 2016-06-18 DIAGNOSIS — I10 Essential (primary) hypertension: Secondary | ICD-10-CM | POA: Diagnosis not present

## 2016-06-18 DIAGNOSIS — R600 Localized edema: Secondary | ICD-10-CM | POA: Diagnosis not present

## 2016-06-18 DIAGNOSIS — K219 Gastro-esophageal reflux disease without esophagitis: Secondary | ICD-10-CM | POA: Diagnosis not present

## 2016-06-18 DIAGNOSIS — R638 Other symptoms and signs concerning food and fluid intake: Secondary | ICD-10-CM | POA: Diagnosis not present

## 2016-06-18 DIAGNOSIS — E119 Type 2 diabetes mellitus without complications: Secondary | ICD-10-CM | POA: Diagnosis not present

## 2016-06-22 DIAGNOSIS — G20 Parkinson's disease: Secondary | ICD-10-CM | POA: Diagnosis not present

## 2016-06-22 DIAGNOSIS — K219 Gastro-esophageal reflux disease without esophagitis: Secondary | ICD-10-CM | POA: Diagnosis not present

## 2016-06-22 DIAGNOSIS — E119 Type 2 diabetes mellitus without complications: Secondary | ICD-10-CM | POA: Diagnosis not present

## 2016-06-22 DIAGNOSIS — R638 Other symptoms and signs concerning food and fluid intake: Secondary | ICD-10-CM | POA: Diagnosis not present

## 2016-06-22 DIAGNOSIS — R600 Localized edema: Secondary | ICD-10-CM | POA: Diagnosis not present

## 2016-06-22 DIAGNOSIS — I10 Essential (primary) hypertension: Secondary | ICD-10-CM | POA: Diagnosis not present

## 2016-06-23 DIAGNOSIS — E119 Type 2 diabetes mellitus without complications: Secondary | ICD-10-CM | POA: Diagnosis not present

## 2016-06-23 DIAGNOSIS — Z961 Presence of intraocular lens: Secondary | ICD-10-CM | POA: Diagnosis not present

## 2016-06-23 DIAGNOSIS — H353231 Exudative age-related macular degeneration, bilateral, with active choroidal neovascularization: Secondary | ICD-10-CM | POA: Diagnosis not present

## 2016-06-25 DIAGNOSIS — G20 Parkinson's disease: Secondary | ICD-10-CM | POA: Diagnosis not present

## 2016-06-25 DIAGNOSIS — K219 Gastro-esophageal reflux disease without esophagitis: Secondary | ICD-10-CM | POA: Diagnosis not present

## 2016-06-25 DIAGNOSIS — R638 Other symptoms and signs concerning food and fluid intake: Secondary | ICD-10-CM | POA: Diagnosis not present

## 2016-06-25 DIAGNOSIS — I10 Essential (primary) hypertension: Secondary | ICD-10-CM | POA: Diagnosis not present

## 2016-06-25 DIAGNOSIS — R600 Localized edema: Secondary | ICD-10-CM | POA: Diagnosis not present

## 2016-06-25 DIAGNOSIS — E119 Type 2 diabetes mellitus without complications: Secondary | ICD-10-CM | POA: Diagnosis not present

## 2016-06-29 DIAGNOSIS — G20 Parkinson's disease: Secondary | ICD-10-CM | POA: Diagnosis not present

## 2016-06-29 DIAGNOSIS — R600 Localized edema: Secondary | ICD-10-CM | POA: Diagnosis not present

## 2016-06-29 DIAGNOSIS — I10 Essential (primary) hypertension: Secondary | ICD-10-CM | POA: Diagnosis not present

## 2016-06-29 DIAGNOSIS — K219 Gastro-esophageal reflux disease without esophagitis: Secondary | ICD-10-CM | POA: Diagnosis not present

## 2016-06-29 DIAGNOSIS — R638 Other symptoms and signs concerning food and fluid intake: Secondary | ICD-10-CM | POA: Diagnosis not present

## 2016-06-29 DIAGNOSIS — E119 Type 2 diabetes mellitus without complications: Secondary | ICD-10-CM | POA: Diagnosis not present

## 2016-06-30 DIAGNOSIS — E039 Hypothyroidism, unspecified: Secondary | ICD-10-CM | POA: Diagnosis not present

## 2016-06-30 DIAGNOSIS — I10 Essential (primary) hypertension: Secondary | ICD-10-CM | POA: Diagnosis not present

## 2016-06-30 DIAGNOSIS — W1839XA Other fall on same level, initial encounter: Secondary | ICD-10-CM | POA: Diagnosis not present

## 2016-06-30 DIAGNOSIS — E119 Type 2 diabetes mellitus without complications: Secondary | ICD-10-CM | POA: Diagnosis not present

## 2016-06-30 DIAGNOSIS — N400 Enlarged prostate without lower urinary tract symptoms: Secondary | ICD-10-CM | POA: Diagnosis not present

## 2016-06-30 DIAGNOSIS — H409 Unspecified glaucoma: Secondary | ICD-10-CM | POA: Diagnosis not present

## 2016-06-30 DIAGNOSIS — R05 Cough: Secondary | ICD-10-CM | POA: Diagnosis not present

## 2016-06-30 DIAGNOSIS — R4702 Dysphasia: Secondary | ICD-10-CM | POA: Diagnosis not present

## 2016-06-30 DIAGNOSIS — G20 Parkinson's disease: Secondary | ICD-10-CM | POA: Diagnosis not present

## 2016-06-30 DIAGNOSIS — K219 Gastro-esophageal reflux disease without esophagitis: Secondary | ICD-10-CM | POA: Diagnosis not present

## 2016-06-30 DIAGNOSIS — R600 Localized edema: Secondary | ICD-10-CM | POA: Diagnosis not present

## 2016-06-30 DIAGNOSIS — K5900 Constipation, unspecified: Secondary | ICD-10-CM | POA: Diagnosis not present

## 2016-07-01 DIAGNOSIS — G20 Parkinson's disease: Secondary | ICD-10-CM | POA: Diagnosis not present

## 2016-07-01 DIAGNOSIS — R600 Localized edema: Secondary | ICD-10-CM | POA: Diagnosis not present

## 2016-07-01 DIAGNOSIS — E119 Type 2 diabetes mellitus without complications: Secondary | ICD-10-CM | POA: Diagnosis not present

## 2016-07-01 DIAGNOSIS — R638 Other symptoms and signs concerning food and fluid intake: Secondary | ICD-10-CM | POA: Diagnosis not present

## 2016-07-01 DIAGNOSIS — I10 Essential (primary) hypertension: Secondary | ICD-10-CM | POA: Diagnosis not present

## 2016-07-01 DIAGNOSIS — K219 Gastro-esophageal reflux disease without esophagitis: Secondary | ICD-10-CM | POA: Diagnosis not present

## 2016-07-02 DIAGNOSIS — K219 Gastro-esophageal reflux disease without esophagitis: Secondary | ICD-10-CM | POA: Diagnosis not present

## 2016-07-02 DIAGNOSIS — R600 Localized edema: Secondary | ICD-10-CM | POA: Diagnosis not present

## 2016-07-02 DIAGNOSIS — I10 Essential (primary) hypertension: Secondary | ICD-10-CM | POA: Diagnosis not present

## 2016-07-02 DIAGNOSIS — E119 Type 2 diabetes mellitus without complications: Secondary | ICD-10-CM | POA: Diagnosis not present

## 2016-07-02 DIAGNOSIS — G20 Parkinson's disease: Secondary | ICD-10-CM | POA: Diagnosis not present

## 2016-07-02 DIAGNOSIS — R638 Other symptoms and signs concerning food and fluid intake: Secondary | ICD-10-CM | POA: Diagnosis not present

## 2016-07-06 DIAGNOSIS — G20 Parkinson's disease: Secondary | ICD-10-CM | POA: Diagnosis not present

## 2016-07-06 DIAGNOSIS — E119 Type 2 diabetes mellitus without complications: Secondary | ICD-10-CM | POA: Diagnosis not present

## 2016-07-06 DIAGNOSIS — R600 Localized edema: Secondary | ICD-10-CM | POA: Diagnosis not present

## 2016-07-06 DIAGNOSIS — R638 Other symptoms and signs concerning food and fluid intake: Secondary | ICD-10-CM | POA: Diagnosis not present

## 2016-07-06 DIAGNOSIS — K219 Gastro-esophageal reflux disease without esophagitis: Secondary | ICD-10-CM | POA: Diagnosis not present

## 2016-07-06 DIAGNOSIS — I10 Essential (primary) hypertension: Secondary | ICD-10-CM | POA: Diagnosis not present

## 2016-07-08 DIAGNOSIS — N400 Enlarged prostate without lower urinary tract symptoms: Secondary | ICD-10-CM | POA: Diagnosis not present

## 2016-07-08 DIAGNOSIS — E119 Type 2 diabetes mellitus without complications: Secondary | ICD-10-CM | POA: Diagnosis not present

## 2016-07-08 DIAGNOSIS — R638 Other symptoms and signs concerning food and fluid intake: Secondary | ICD-10-CM | POA: Diagnosis not present

## 2016-07-08 DIAGNOSIS — E039 Hypothyroidism, unspecified: Secondary | ICD-10-CM | POA: Diagnosis not present

## 2016-07-08 DIAGNOSIS — R05 Cough: Secondary | ICD-10-CM | POA: Diagnosis not present

## 2016-07-08 DIAGNOSIS — R600 Localized edema: Secondary | ICD-10-CM | POA: Diagnosis not present

## 2016-07-08 DIAGNOSIS — G20 Parkinson's disease: Secondary | ICD-10-CM | POA: Diagnosis not present

## 2016-07-08 DIAGNOSIS — K5900 Constipation, unspecified: Secondary | ICD-10-CM | POA: Diagnosis not present

## 2016-07-08 DIAGNOSIS — I10 Essential (primary) hypertension: Secondary | ICD-10-CM | POA: Diagnosis not present

## 2016-07-08 DIAGNOSIS — G4700 Insomnia, unspecified: Secondary | ICD-10-CM | POA: Diagnosis not present

## 2016-07-08 DIAGNOSIS — K219 Gastro-esophageal reflux disease without esophagitis: Secondary | ICD-10-CM | POA: Diagnosis not present

## 2016-07-08 DIAGNOSIS — H409 Unspecified glaucoma: Secondary | ICD-10-CM | POA: Diagnosis not present

## 2016-07-08 DIAGNOSIS — E559 Vitamin D deficiency, unspecified: Secondary | ICD-10-CM | POA: Diagnosis not present

## 2016-07-09 DIAGNOSIS — I10 Essential (primary) hypertension: Secondary | ICD-10-CM | POA: Diagnosis not present

## 2016-07-09 DIAGNOSIS — G20 Parkinson's disease: Secondary | ICD-10-CM | POA: Diagnosis not present

## 2016-07-09 DIAGNOSIS — E119 Type 2 diabetes mellitus without complications: Secondary | ICD-10-CM | POA: Diagnosis not present

## 2016-07-09 DIAGNOSIS — R638 Other symptoms and signs concerning food and fluid intake: Secondary | ICD-10-CM | POA: Diagnosis not present

## 2016-07-09 DIAGNOSIS — K219 Gastro-esophageal reflux disease without esophagitis: Secondary | ICD-10-CM | POA: Diagnosis not present

## 2016-07-09 DIAGNOSIS — R600 Localized edema: Secondary | ICD-10-CM | POA: Diagnosis not present

## 2016-07-13 ENCOUNTER — Other Ambulatory Visit: Payer: Self-pay | Admitting: Internal Medicine

## 2016-07-13 DIAGNOSIS — R600 Localized edema: Secondary | ICD-10-CM | POA: Diagnosis not present

## 2016-07-13 DIAGNOSIS — K219 Gastro-esophageal reflux disease without esophagitis: Secondary | ICD-10-CM | POA: Diagnosis not present

## 2016-07-13 DIAGNOSIS — E119 Type 2 diabetes mellitus without complications: Secondary | ICD-10-CM | POA: Diagnosis not present

## 2016-07-13 DIAGNOSIS — R638 Other symptoms and signs concerning food and fluid intake: Secondary | ICD-10-CM | POA: Diagnosis not present

## 2016-07-13 DIAGNOSIS — G20 Parkinson's disease: Secondary | ICD-10-CM | POA: Diagnosis not present

## 2016-07-13 DIAGNOSIS — I10 Essential (primary) hypertension: Secondary | ICD-10-CM | POA: Diagnosis not present

## 2016-07-13 MED ORDER — ZOLPIDEM TARTRATE 5 MG PO TABS: 2.5000 mg | ORAL_TABLET | Freq: Every evening | ORAL | 5 refills | Status: DC | PRN

## 2016-07-13 NOTE — Telephone Encounter (Signed)
Ok to refill # 15    1/2 tab at bedtime as needed for sleep      Ref x 5

## 2016-07-13 NOTE — Telephone Encounter (Signed)
Please advise on refill for Zolpidem. Last OV in January

## 2016-07-13 NOTE — Addendum Note (Signed)
Addended by: Lorretta Harp on: 07/13/2016 05:32 PM   Modules accepted: Orders

## 2016-07-14 DIAGNOSIS — K219 Gastro-esophageal reflux disease without esophagitis: Secondary | ICD-10-CM | POA: Diagnosis not present

## 2016-07-14 DIAGNOSIS — R600 Localized edema: Secondary | ICD-10-CM | POA: Diagnosis not present

## 2016-07-14 DIAGNOSIS — G20 Parkinson's disease: Secondary | ICD-10-CM | POA: Diagnosis not present

## 2016-07-14 DIAGNOSIS — E119 Type 2 diabetes mellitus without complications: Secondary | ICD-10-CM | POA: Diagnosis not present

## 2016-07-14 DIAGNOSIS — I10 Essential (primary) hypertension: Secondary | ICD-10-CM | POA: Diagnosis not present

## 2016-07-14 DIAGNOSIS — R638 Other symptoms and signs concerning food and fluid intake: Secondary | ICD-10-CM | POA: Diagnosis not present

## 2016-07-15 ENCOUNTER — Other Ambulatory Visit (HOSPITAL_COMMUNITY): Payer: Self-pay | Admitting: Family Medicine

## 2016-07-15 DIAGNOSIS — R1319 Other dysphagia: Secondary | ICD-10-CM

## 2016-07-17 ENCOUNTER — Other Ambulatory Visit: Payer: Self-pay | Admitting: *Deleted

## 2016-07-17 ENCOUNTER — Telehealth: Payer: Self-pay | Admitting: *Deleted

## 2016-07-17 DIAGNOSIS — H353231 Exudative age-related macular degeneration, bilateral, with active choroidal neovascularization: Secondary | ICD-10-CM | POA: Diagnosis not present

## 2016-07-17 DIAGNOSIS — H268 Other specified cataract: Secondary | ICD-10-CM | POA: Diagnosis not present

## 2016-07-17 DIAGNOSIS — E119 Type 2 diabetes mellitus without complications: Secondary | ICD-10-CM | POA: Diagnosis not present

## 2016-07-17 DIAGNOSIS — Z7984 Long term (current) use of oral hypoglycemic drugs: Secondary | ICD-10-CM | POA: Diagnosis not present

## 2016-07-17 DIAGNOSIS — H5711 Ocular pain, right eye: Secondary | ICD-10-CM | POA: Diagnosis not present

## 2016-07-17 DIAGNOSIS — Z961 Presence of intraocular lens: Secondary | ICD-10-CM | POA: Diagnosis not present

## 2016-07-17 MED ORDER — ZOLPIDEM TARTRATE 5 MG PO TABS: 2.5000 mg | ORAL_TABLET | Freq: Every evening | ORAL | 5 refills | Status: DC | PRN

## 2016-07-17 NOTE — Telephone Encounter (Signed)
ATC x1 to pt, unable to leave a vm   Rx was placed in brown folder up front for pick up

## 2016-07-20 ENCOUNTER — Ambulatory Visit (HOSPITAL_COMMUNITY)
Admission: RE | Admit: 2016-07-20 | Discharge: 2016-07-20 | Disposition: A | Discharge status: Home / Self Care | Payer: Medicare Other | Source: Ambulatory Visit | Attending: Family Medicine | Admitting: Family Medicine

## 2016-07-20 DIAGNOSIS — R1319 Other dysphagia: Secondary | ICD-10-CM | POA: Insufficient documentation

## 2016-07-20 DIAGNOSIS — R05 Cough: Secondary | ICD-10-CM | POA: Diagnosis not present

## 2016-07-20 NOTE — Progress Notes (Signed)
Modified Barium Swallow Progress Note  Patient Details  Name: [name] MRN: [id number] Date of Birth: Sep 12, 1931  Today's Date: 07/20/2016  Modified Barium Swallow completed.  Full report located under Chart Review in the Imaging Section.  Brief recommendations include the following:  Clinical Impression  Pt exhibits moderate-severe sensory impairments coupled with anatomical difference in what appears to be a cervical curvature (lordsis). Epiglottic deflection and laryngeal closure inhibited by cervical vertebrae in combination with decreased sensation and swallow initiating at the pyriform sinuses leading to silent aspiration during the swallow with thin. Significant laryngeal penetration to vocal cords with nectar and increased vallecular residue compared to thin increasing aspiration risk post prandial. Pt with decreased awareness of dysphagia even after results explained and education provided. Without a significant difference in viscocities, recommend continue small sips of thin (water is safest), no straws and pills whole in puree. If symptoms worsen, demonstrates chest congestion or becomes ill, consider short term nectar thick liquids.     Swallow Evaluation Recommendations       SLP Diet Recommendations: Regular solids;Thin liquid   Liquid Administration via: Cup;No straw   Medication Administration: Whole meds with puree   Supervision: Patient able to self feed   Compensations: Clear throat after each swallow;Slow rate;Small sips/bites   Postural Changes: Seated upright at 90 degrees;Remain semi-upright after after feeds/meals (Comment)   Oral Care Recommendations: Oral care BID        [name] 07/20/2016,1:04 PM  [name] [name].Ed [employer] [phone]

## 2016-07-21 DIAGNOSIS — G20 Parkinson's disease: Secondary | ICD-10-CM | POA: Diagnosis not present

## 2016-07-21 DIAGNOSIS — I10 Essential (primary) hypertension: Secondary | ICD-10-CM | POA: Diagnosis not present

## 2016-07-21 DIAGNOSIS — R638 Other symptoms and signs concerning food and fluid intake: Secondary | ICD-10-CM | POA: Diagnosis not present

## 2016-07-21 DIAGNOSIS — K219 Gastro-esophageal reflux disease without esophagitis: Secondary | ICD-10-CM | POA: Diagnosis not present

## 2016-07-21 DIAGNOSIS — E119 Type 2 diabetes mellitus without complications: Secondary | ICD-10-CM | POA: Diagnosis not present

## 2016-07-21 DIAGNOSIS — R600 Localized edema: Secondary | ICD-10-CM | POA: Diagnosis not present

## 2016-07-23 DIAGNOSIS — K219 Gastro-esophageal reflux disease without esophagitis: Secondary | ICD-10-CM | POA: Diagnosis not present

## 2016-07-23 DIAGNOSIS — R600 Localized edema: Secondary | ICD-10-CM | POA: Diagnosis not present

## 2016-07-23 DIAGNOSIS — I10 Essential (primary) hypertension: Secondary | ICD-10-CM | POA: Diagnosis not present

## 2016-07-23 DIAGNOSIS — R638 Other symptoms and signs concerning food and fluid intake: Secondary | ICD-10-CM | POA: Diagnosis not present

## 2016-07-23 DIAGNOSIS — E119 Type 2 diabetes mellitus without complications: Secondary | ICD-10-CM | POA: Diagnosis not present

## 2016-07-23 DIAGNOSIS — G20 Parkinson's disease: Secondary | ICD-10-CM | POA: Diagnosis not present

## 2016-07-28 DIAGNOSIS — K219 Gastro-esophageal reflux disease without esophagitis: Secondary | ICD-10-CM | POA: Diagnosis not present

## 2016-07-28 DIAGNOSIS — E119 Type 2 diabetes mellitus without complications: Secondary | ICD-10-CM | POA: Diagnosis not present

## 2016-07-28 DIAGNOSIS — N400 Enlarged prostate without lower urinary tract symptoms: Secondary | ICD-10-CM | POA: Diagnosis not present

## 2016-07-28 DIAGNOSIS — R600 Localized edema: Secondary | ICD-10-CM | POA: Diagnosis not present

## 2016-07-28 DIAGNOSIS — J302 Other seasonal allergic rhinitis: Secondary | ICD-10-CM | POA: Diagnosis not present

## 2016-07-28 DIAGNOSIS — R4702 Dysphasia: Secondary | ICD-10-CM | POA: Diagnosis not present

## 2016-07-28 DIAGNOSIS — H268 Other specified cataract: Secondary | ICD-10-CM | POA: Diagnosis not present

## 2016-07-28 DIAGNOSIS — K5900 Constipation, unspecified: Secondary | ICD-10-CM | POA: Diagnosis not present

## 2016-07-28 DIAGNOSIS — I10 Essential (primary) hypertension: Secondary | ICD-10-CM | POA: Diagnosis not present

## 2016-07-28 DIAGNOSIS — E039 Hypothyroidism, unspecified: Secondary | ICD-10-CM | POA: Diagnosis not present

## 2016-07-28 DIAGNOSIS — Z961 Presence of intraocular lens: Secondary | ICD-10-CM | POA: Diagnosis not present

## 2016-07-28 DIAGNOSIS — G20 Parkinson's disease: Secondary | ICD-10-CM | POA: Diagnosis not present

## 2016-07-28 DIAGNOSIS — W1839XA Other fall on same level, initial encounter: Secondary | ICD-10-CM | POA: Diagnosis not present

## 2016-07-28 DIAGNOSIS — H353231 Exudative age-related macular degeneration, bilateral, with active choroidal neovascularization: Secondary | ICD-10-CM | POA: Diagnosis not present

## 2016-07-28 DIAGNOSIS — R05 Cough: Secondary | ICD-10-CM | POA: Diagnosis not present

## 2016-08-03 ENCOUNTER — Telehealth: Payer: Self-pay | Admitting: Internal Medicine

## 2016-08-03 NOTE — Telephone Encounter (Signed)
Spoke with [name]. Per the patient's chart, Ambien was reordered on 07/17/16 and appeared to have been sent to the mail order pharmacy. Called [facility], they do not have this medication on file. Per [name], they never stopped by the office to pick up the RX. The original RX was found up front in the brown folder.   Since the medication had been ordered 3 times previously, I took the RX that had printed on 07/17/16 and called it into [facility] on [address].   [name] is aware that the medication has been called in. Nothing further needed.

## 2016-08-06 ENCOUNTER — Ambulatory Visit: Payer: Medicare Other | Admitting: Internal Medicine

## 2016-08-10 DIAGNOSIS — K219 Gastro-esophageal reflux disease without esophagitis: Secondary | ICD-10-CM | POA: Diagnosis not present

## 2016-08-10 DIAGNOSIS — I10 Essential (primary) hypertension: Secondary | ICD-10-CM | POA: Diagnosis not present

## 2016-08-10 DIAGNOSIS — E559 Vitamin D deficiency, unspecified: Secondary | ICD-10-CM | POA: Diagnosis not present

## 2016-08-10 DIAGNOSIS — H409 Unspecified glaucoma: Secondary | ICD-10-CM | POA: Diagnosis not present

## 2016-08-10 DIAGNOSIS — G4700 Insomnia, unspecified: Secondary | ICD-10-CM | POA: Diagnosis not present

## 2016-08-10 DIAGNOSIS — G20 Parkinson's disease: Secondary | ICD-10-CM | POA: Diagnosis not present

## 2016-08-10 DIAGNOSIS — R600 Localized edema: Secondary | ICD-10-CM | POA: Diagnosis not present

## 2016-08-10 DIAGNOSIS — K5900 Constipation, unspecified: Secondary | ICD-10-CM | POA: Diagnosis not present

## 2016-08-10 DIAGNOSIS — R05 Cough: Secondary | ICD-10-CM | POA: Diagnosis not present

## 2016-08-10 DIAGNOSIS — E119 Type 2 diabetes mellitus without complications: Secondary | ICD-10-CM | POA: Diagnosis not present

## 2016-08-10 DIAGNOSIS — E039 Hypothyroidism, unspecified: Secondary | ICD-10-CM | POA: Diagnosis not present

## 2016-08-10 DIAGNOSIS — N400 Enlarged prostate without lower urinary tract symptoms: Secondary | ICD-10-CM | POA: Diagnosis not present

## 2016-08-11 DIAGNOSIS — H353231 Exudative age-related macular degeneration, bilateral, with active choroidal neovascularization: Secondary | ICD-10-CM | POA: Diagnosis not present

## 2016-08-11 DIAGNOSIS — Z961 Presence of intraocular lens: Secondary | ICD-10-CM | POA: Diagnosis not present

## 2016-08-11 DIAGNOSIS — E119 Type 2 diabetes mellitus without complications: Secondary | ICD-10-CM | POA: Diagnosis not present

## 2016-08-18 DIAGNOSIS — J302 Other seasonal allergic rhinitis: Secondary | ICD-10-CM | POA: Diagnosis not present

## 2016-08-24 ENCOUNTER — Ambulatory Visit (INDEPENDENT_AMBULATORY_CARE_PROVIDER_SITE_OTHER): Payer: Medicare Other | Admitting: Podiatry

## 2016-08-24 ENCOUNTER — Encounter: Payer: Self-pay | Admitting: Podiatry

## 2016-08-24 DIAGNOSIS — L600 Ingrowing nail: Secondary | ICD-10-CM | POA: Diagnosis not present

## 2016-08-25 DIAGNOSIS — E039 Hypothyroidism, unspecified: Secondary | ICD-10-CM | POA: Diagnosis not present

## 2016-08-25 DIAGNOSIS — W1839XA Other fall on same level, initial encounter: Secondary | ICD-10-CM | POA: Diagnosis not present

## 2016-08-25 DIAGNOSIS — J302 Other seasonal allergic rhinitis: Secondary | ICD-10-CM | POA: Diagnosis not present

## 2016-08-25 DIAGNOSIS — R05 Cough: Secondary | ICD-10-CM | POA: Diagnosis not present

## 2016-08-25 DIAGNOSIS — I10 Essential (primary) hypertension: Secondary | ICD-10-CM | POA: Diagnosis not present

## 2016-08-25 DIAGNOSIS — K5900 Constipation, unspecified: Secondary | ICD-10-CM | POA: Diagnosis not present

## 2016-08-25 DIAGNOSIS — R600 Localized edema: Secondary | ICD-10-CM | POA: Diagnosis not present

## 2016-08-25 DIAGNOSIS — E119 Type 2 diabetes mellitus without complications: Secondary | ICD-10-CM | POA: Diagnosis not present

## 2016-08-25 DIAGNOSIS — R4702 Dysphasia: Secondary | ICD-10-CM | POA: Diagnosis not present

## 2016-08-25 DIAGNOSIS — N400 Enlarged prostate without lower urinary tract symptoms: Secondary | ICD-10-CM | POA: Diagnosis not present

## 2016-08-25 DIAGNOSIS — K219 Gastro-esophageal reflux disease without esophagitis: Secondary | ICD-10-CM | POA: Diagnosis not present

## 2016-08-25 DIAGNOSIS — G20 Parkinson's disease: Secondary | ICD-10-CM | POA: Diagnosis not present

## 2016-08-26 NOTE — Progress Notes (Signed)
Subjective:    Patient ID: [name], male   DOB: 83 y.o.   MRN: [id number]   HPI patient presents with painful nail plate right over left stating it's been irritated and making it hard for him to wear shoe gear comfortably. States it's been present for several months    ROS      Objective:  Physical Exam neurovascular status intact unchanged from previous visits with patient found have incurvated right hallux nail medial border that's painful when pressed with thickness and mild discomfort on the left not to the same degree     Assessment:    Ingrown toenail deformity right hallux that is very painful with mild to moderate on the left     Plan:    H&P condition reviewed reviewed and discussed removal of the nail corner. Due to long-term health issues have recommended temporary procedure versus permanent correction and I infiltrated the right hallux 60 Milligan times like Marcaine mixture remove the medial border and allowed channel for growth and applied sterile dressing. Explain this may need to be done approximately once a year but is much safer procedure

## 2016-09-01 DIAGNOSIS — J302 Other seasonal allergic rhinitis: Secondary | ICD-10-CM | POA: Diagnosis not present

## 2016-09-04 DIAGNOSIS — E119 Type 2 diabetes mellitus without complications: Secondary | ICD-10-CM | POA: Diagnosis not present

## 2016-09-04 DIAGNOSIS — I10 Essential (primary) hypertension: Secondary | ICD-10-CM | POA: Diagnosis not present

## 2016-09-04 DIAGNOSIS — E782 Mixed hyperlipidemia: Secondary | ICD-10-CM | POA: Diagnosis not present

## 2016-09-04 DIAGNOSIS — E039 Hypothyroidism, unspecified: Secondary | ICD-10-CM | POA: Diagnosis not present

## 2016-09-08 DIAGNOSIS — J302 Other seasonal allergic rhinitis: Secondary | ICD-10-CM | POA: Diagnosis not present

## 2016-09-10 DIAGNOSIS — R600 Localized edema: Secondary | ICD-10-CM | POA: Diagnosis not present

## 2016-09-10 DIAGNOSIS — I10 Essential (primary) hypertension: Secondary | ICD-10-CM | POA: Diagnosis not present

## 2016-09-10 DIAGNOSIS — K219 Gastro-esophageal reflux disease without esophagitis: Secondary | ICD-10-CM | POA: Diagnosis not present

## 2016-09-10 DIAGNOSIS — G20 Parkinson's disease: Secondary | ICD-10-CM | POA: Diagnosis not present

## 2016-09-10 DIAGNOSIS — R05 Cough: Secondary | ICD-10-CM | POA: Diagnosis not present

## 2016-09-10 DIAGNOSIS — K5900 Constipation, unspecified: Secondary | ICD-10-CM | POA: Diagnosis not present

## 2016-09-10 DIAGNOSIS — E559 Vitamin D deficiency, unspecified: Secondary | ICD-10-CM | POA: Diagnosis not present

## 2016-09-10 DIAGNOSIS — G4700 Insomnia, unspecified: Secondary | ICD-10-CM | POA: Diagnosis not present

## 2016-09-10 DIAGNOSIS — H409 Unspecified glaucoma: Secondary | ICD-10-CM | POA: Diagnosis not present

## 2016-09-10 DIAGNOSIS — N400 Enlarged prostate without lower urinary tract symptoms: Secondary | ICD-10-CM | POA: Diagnosis not present

## 2016-09-10 DIAGNOSIS — E119 Type 2 diabetes mellitus without complications: Secondary | ICD-10-CM | POA: Diagnosis not present

## 2016-09-10 DIAGNOSIS — E039 Hypothyroidism, unspecified: Secondary | ICD-10-CM | POA: Diagnosis not present

## 2016-09-15 DIAGNOSIS — J302 Other seasonal allergic rhinitis: Secondary | ICD-10-CM | POA: Diagnosis not present

## 2016-09-22 DIAGNOSIS — R600 Localized edema: Secondary | ICD-10-CM | POA: Diagnosis not present

## 2016-09-22 DIAGNOSIS — N400 Enlarged prostate without lower urinary tract symptoms: Secondary | ICD-10-CM | POA: Diagnosis not present

## 2016-09-22 DIAGNOSIS — G20 Parkinson's disease: Secondary | ICD-10-CM | POA: Diagnosis not present

## 2016-09-22 DIAGNOSIS — R4702 Dysphasia: Secondary | ICD-10-CM | POA: Diagnosis not present

## 2016-09-22 DIAGNOSIS — K5900 Constipation, unspecified: Secondary | ICD-10-CM | POA: Diagnosis not present

## 2016-09-22 DIAGNOSIS — R05 Cough: Secondary | ICD-10-CM | POA: Diagnosis not present

## 2016-09-22 DIAGNOSIS — J302 Other seasonal allergic rhinitis: Secondary | ICD-10-CM | POA: Diagnosis not present

## 2016-09-22 DIAGNOSIS — K219 Gastro-esophageal reflux disease without esophagitis: Secondary | ICD-10-CM | POA: Diagnosis not present

## 2016-09-22 DIAGNOSIS — E119 Type 2 diabetes mellitus without complications: Secondary | ICD-10-CM | POA: Diagnosis not present

## 2016-09-22 DIAGNOSIS — I10 Essential (primary) hypertension: Secondary | ICD-10-CM | POA: Diagnosis not present

## 2016-09-22 DIAGNOSIS — E039 Hypothyroidism, unspecified: Secondary | ICD-10-CM | POA: Diagnosis not present

## 2016-09-22 DIAGNOSIS — W1839XA Other fall on same level, initial encounter: Secondary | ICD-10-CM | POA: Diagnosis not present

## 2016-09-29 DIAGNOSIS — H353231 Exudative age-related macular degeneration, bilateral, with active choroidal neovascularization: Secondary | ICD-10-CM | POA: Diagnosis not present

## 2016-09-29 DIAGNOSIS — Z961 Presence of intraocular lens: Secondary | ICD-10-CM | POA: Diagnosis not present

## 2016-09-29 DIAGNOSIS — E119 Type 2 diabetes mellitus without complications: Secondary | ICD-10-CM | POA: Diagnosis not present

## 2016-10-07 DIAGNOSIS — I10 Essential (primary) hypertension: Secondary | ICD-10-CM | POA: Diagnosis not present

## 2016-10-07 DIAGNOSIS — E039 Hypothyroidism, unspecified: Secondary | ICD-10-CM | POA: Diagnosis not present

## 2016-10-07 DIAGNOSIS — H409 Unspecified glaucoma: Secondary | ICD-10-CM | POA: Diagnosis not present

## 2016-10-07 DIAGNOSIS — G20 Parkinson's disease: Secondary | ICD-10-CM | POA: Diagnosis not present

## 2016-10-07 DIAGNOSIS — G4700 Insomnia, unspecified: Secondary | ICD-10-CM | POA: Diagnosis not present

## 2016-10-07 DIAGNOSIS — K219 Gastro-esophageal reflux disease without esophagitis: Secondary | ICD-10-CM | POA: Diagnosis not present

## 2016-10-07 DIAGNOSIS — R600 Localized edema: Secondary | ICD-10-CM | POA: Diagnosis not present

## 2016-10-07 DIAGNOSIS — N400 Enlarged prostate without lower urinary tract symptoms: Secondary | ICD-10-CM | POA: Diagnosis not present

## 2016-10-07 DIAGNOSIS — E119 Type 2 diabetes mellitus without complications: Secondary | ICD-10-CM | POA: Diagnosis not present

## 2016-10-07 DIAGNOSIS — K5900 Constipation, unspecified: Secondary | ICD-10-CM | POA: Diagnosis not present

## 2016-10-07 DIAGNOSIS — R05 Cough: Secondary | ICD-10-CM | POA: Diagnosis not present

## 2016-10-07 DIAGNOSIS — E559 Vitamin D deficiency, unspecified: Secondary | ICD-10-CM | POA: Diagnosis not present

## 2016-10-08 DIAGNOSIS — T8522XA Displacement of intraocular lens, initial encounter: Secondary | ICD-10-CM | POA: Diagnosis not present

## 2016-10-08 DIAGNOSIS — H524 Presbyopia: Secondary | ICD-10-CM | POA: Diagnosis not present

## 2016-10-12 NOTE — Progress Notes (Signed)
[facility] Note  10/13/2016     CHIEF COMPLAINT Patient presents for Retina Evaluation   HISTORY OF PRESENT ILLNESS: [name] is a 83 y.o. male who presents to the clinic today for:   HPI    Retina Evaluation  In right eye.  This started 6 weeks ago.  Associated Symptoms Pain.  Negative for Flashes, Blind Spot, Photophobia, Scalp Tenderness, Fever, Glare, Jaw Claudication, Weight Loss, Floaters, Distortion, Redness, Trauma, Shoulder/Hip pain and Fatigue.  Context:  distance vision, near vision and mid-range vision.  Treatments tried include no treatments.        Comments  Pt presents on the referral of Dr. Verlon Au for concern of dislocated IOL OD;  Pt states OD VA is very blurred, pt states that he fell and hit the back of head on [facility] unit and that's when OD VA be came very blurred; Pt states that he sees Dr. Manuella Ghazi for injections in OU, pt states that last injection was x 6-8 weeks ago; Pt denies floaters, denies flashes, denies wavy VA; Pt states that OD is painful off and on; Pt reports that he was able to see "4 or 5 lines until this happened"; Pt states that he is type II DM, last CBG - unknown, A1C - unknown; Pt reports that he is taking Atropine OD qd, Azopt OD TID, Combigan OU BID, Latanoprost OD qd, and AT OU QID     Last edited by Alyse Low on 10/13/2016  9:14 AM. (History)      Referring physician: Calvert Cantor, MD [address] [place], [place] [address]  HISTORICAL INFORMATION:   Selected notes from the MEDICAL RECORD NUMBER Referral from Dr. Verlon Au for dislocated IOL OD;  Ocular Hx - glaucoma OD; pseudophakia OU ([name]); h/o ARMD w atrophy followed by Dr. Dwana Melena Currently using Azopt OD TID, Atropine OU qd, Combigan OD BID, Latanoprost OD qhs; PMH - IDDM; chronic renal insufficiency; elevated chol; HTN; Parkinsons; Stroke; thyroid disease  Last clinic visit w/ Manuella Ghazi 09/29/16: Exam noted inferiorly displaced  PCIOL OD  ASSESSMENT/PLAN:  1. Exudative age-related macular degeneration of both eyes with active choroidal neovascularization ([place]) Macula OCT - OU - Both Eyes  2. Diabetes mellitus type 2 without retinopathy ([place])  3. Pseudophakia of both eyes   1. Exudative age-related macular degeneration of both eyes with active choroidal neovascularization ([place]) - I have reviewed the nature and mechanism and discussed the implications to his vision. I have reviewed the various treatment regiments along with their risk and benefits with intravitreal anti VEGF agents. Risks of endophthalmitis and vascular occlusive events and atrophic changes discussed with patient. I have stressed continued AREDS vitamins and monitoring of his [place]ms[place] Grid. - s/p IVA OD (IVE) with the last injection on 09/13/14, s/p IVA x 3 OS (IVE) with the last injection on 12/12/13  - s/p IVE OD #8 11/12/15, #9 01/07/16 - Recommend ATs for eye discomfort - Continue Azopt TID OD and Latanoprost at bedtime OD  - Continue Combigan BID OU - we will continue to monitor and reevaluate in 10 weeks  2. Diabetes mellitus type 2 without retinopathy ([place]) - I have stressed strict glycemic control with a goal A1c< 7.0 along with continued optimization of serum lipids, blood pressure , and avoiding cigarette or any type of tobacco, and the need for long term follow up was also discussed with patient.  3. Pseudophakia - s/p PCIOL OU  CURRENT MEDICATIONS: Current Outpatient Prescriptions (Ophthalmic Drugs)  Medication Sig  . atropine 1 % ophthalmic solution Place 1 drop into the right eye daily at 6pm for 14 days.  . brimonidine-timolol (COMBIGAN) 0.2-0.5 % ophthalmic solution Place 1 drop into both eyes 2 times daily.  . brinzolamide (AZOPT) 1 % ophthalmic suspension Place 1 drop into the right eye 3 times daily.  Marland Kitchen latanoprost (XALATAN) 0.005 % ophthalmic solution Place 1 drop into the right eye daily.  . COMBIGAN 0.2-0.5 % ophthalmic  solution Place 1 drop into both eyes every 12 (twelve) hours.    No current facility-administered medications for this visit.  (Ophthalmic Drugs)   Current Outpatient Prescriptions (Other)  Medication Sig  . alfuzosin (UROXATRAL) 10 MG 24 hr tablet Take 1 tablet (10 mg total) by mouth at bedtime.  Marland Kitchen aspirin EC 81 MG tablet Take 81 mg by mouth daily.   . carbidopa-levodopa (SINEMET IR) 25-100 MG tablet Take 1 tablet by mouth 3 (three) times daily.   . Cholecalciferol (VITAMIN D-3) 5000 units TABS Take 5,000 Units by mouth daily.  . colesevelam (WELCHOL) 625 MG tablet Take 1,875 mg by mouth 2 (two) times daily with a meal.   . diltiazem (CARDIZEM CD) 180 MG 24 hr capsule Take 180 mg by mouth daily.  Marland Kitchen docusate sodium (COLACE) 100 MG capsule Take 100 mg by mouth daily.  . furosemide (LASIX) 20 MG tablet Take 20 mg by mouth daily.  Marland Kitchen levothyroxine (SYNTHROID, LEVOTHROID) 200 MCG tablet Take 100 mcg by mouth daily before breakfast. Pt takes with a 109mcg tablet.  Marland Kitchen levothyroxine (SYNTHROID, LEVOTHROID) 75 MCG tablet Take 75 mcg by mouth daily before breakfast. Pt takes with a 133mcg tablet.  . Meth-Hyo-M Bl-Na Phos-Ph Sal (URIBEL) 118 MG CAPS Take 1 capsule (118 mg total) by mouth 3 (three) times daily as needed (burning).  . mirabegron ER (MYRBETRIQ) 50 MG TB24 tablet Take 1 tablet (50 mg total) by mouth daily. (Patient not taking: Reported on 06/05/2016)  . multivitamin-lutein (OCUVITE-LUTEIN) CAPS capsule Take 1 capsule by mouth daily.  Marland Kitchen omeprazole (PRILOSEC) 20 MG capsule Take 20 mg by mouth daily.  . polyethylene glycol (MIRALAX / GLYCOLAX) packet Take 17 g by mouth daily.  . potassium chloride (K-DUR,KLOR-CON) 10 MEQ tablet Take 10 mEq by mouth daily.  . sitaGLIPtin (JANUVIA) 50 MG tablet Take 1 tablet (50 mg total) by mouth daily.  . traMADol (ULTRAM) 50 MG tablet Take 1 tablet (50 mg total) by mouth every 6 (six) hours as needed. (Patient not taking: Reported on 06/05/2016)  . zolpidem  (AMBIEN) 5 MG tablet Take 0.5 tablets (2.5 mg total) by mouth at bedtime as needed for sleep.   No current facility-administered medications for this visit.  (Other)      REVIEW OF SYSTEMS: ROS    Positive for: Musculoskeletal, Endocrine, Eyes   Negative for: Constitutional, Gastrointestinal, Neurological, Skin, Genitourinary, HENT, Cardiovascular, Respiratory, Psychiatric, Allergic/Imm, Heme/Lymph   Last edited by Alyse Low on 10/13/2016  9:12 AM. (History)       ALLERGIES Allergies  Allergen Reactions  . Colchicine Anaphylaxis  . Hibiclens [Chlorhexidine] Hives  . Atorvastatin Other (See Comments)    Reaction:  Joint pain   . Ceftin Diarrhea  . Celebrex [Celecoxib] Swelling  . Codeine Hives  . Contrast Media [Iodinated Diagnostic Agents] Nausea And Vomiting and Rash  . Erythromycin Diarrhea  . Ezetimibe Other (See Comments)    Reaction:  Joint pain   . Oxycodone-Acetaminophen Hives  . Rosuvastatin  Other (See Comments)    Reaction:  Joint pain   . Simvastatin Other (See Comments)    Reaction:  Joint pain   . Cefpodoxime Rash  . Cefuroxime Axetil Rash  . Nitroglycerin Other (See Comments)    Reaction:  Lowers pts BP  . Tetanus Toxoid Rash  . Vantin Other (See Comments)    Reaction:  Unknown     PAST MEDICAL HISTORY Past Medical History:  Diagnosis Date  . Allergic rhinitis   . Arthritis    arthritis-kyphosis  . Asthmatic bronchitis   . Chronic kidney disease   . DM type 2 (diabetes mellitus, type 2) ([place])   . DVT, lower extremity, proximal, acute ([place])   . Dyslipidemia   . Dysrhythmia    '97 tachycardia- no recent issues  . GERD (gastroesophageal reflux disease)   . Hearing impaired    bilateral hearing aids  . History of kidney stones   . History of skin cancer   . Hypertension   . Hypothyroidism   . Macular degeneration    injections done bilaterally recent - 08-22-13  . Prostate cancer [facility])    Prostate cancer-radiation only,skin  cancer-basal cell and last squamous cell right eye  . Stroke ([place])   . Urgency-frequency syndrome   . Urticaria    Past Surgical History:  Procedure Laterality Date  . BACK SURGERY     lumbar fracture  . cataract surgery Bilateral   . COLONOSCOPY W/ POLYPECTOMY     x2   . CYSTOSCOPY WITH INJECTION N/A 09/11/2013   Procedure: CYSTOSCOPY WITH BOTOX INJECTION INTO THE BLADDER;  Surgeon: Ailene Rud, MD;  Location: [facility];  Service: Urology;  Laterality: N/A;  . CYSTOSCOPY WITH RETROGRADE PYELOGRAM, URETEROSCOPY AND STENT PLACEMENT    . CYSTOSCOPY WITH STENT PLACEMENT Right 01/26/2016   Procedure: CYSTOSCOPY, RIGHT RETROGRADE WITH RIGHT URETERAL STENT PLACEMENT;  Surgeon: Cleon Gustin, MD;  [facility];  Service: Urology;  Laterality: Right;  . CYSTOSCOPY/RETROGRADE/URETEROSCOPY Right 04/22/2016   Procedure: CYSTOSCOPY/RETROGRADE/ REMOVAL JJ STENT right insertion stent right insertion of foley;  Surgeon: Franchot Gallo, MD;  [facility];  Service: Urology;  Laterality: Right;  . EXTRACORPOREAL SHOCK WAVE LITHOTRIPSY Right 02/27/2016   Procedure: RIGHT EXTRACORPOREAL SHOCK WAVE LITHOTRIPSY (ESWL) GAITED;  Surgeon: Cleon Gustin, MD;  [facility];  Service: Urology;  Laterality: Right;  . EXTRACORPOREAL SHOCK WAVE LITHOTRIPSY Right 02/10/2016   Procedure: EXTRACORPOREAL SHOCK WAVE LITHOTRIPSY (ESWL);  Surgeon: Kathie Rhodes, MD;  [facility];  Service: Urology;  Laterality: Right;  [facility]-[phone] [id number] A BCBS- [id number]   . EYE SURGERY     macular degeneration  . GALLBLADDER SURGERY  2006   no cholecystectomy  . HERNIA REPAIR    . LUNG REMOVAL, PARTIAL  age 11   for bronchiectasis  . PARTIAL THYMECTOMY  1985   for nodules  . POPLITEAL SYNOVIAL CYST EXCISION  2005  . TOTAL KNEE ARTHROPLASTY  2008   right  . TOTAL KNEE ARTHROPLASTY  2010   left    FAMILY HISTORY Family History  Problem Relation Age of Onset  .  Other Sister        ophth disease  . Heart attack Father        x7  . Heart disease Father        MI x7  . Hypertension Mother   . Stroke Mother 60  . Amblyopia Neg Hx   . Blindness Neg Hx   . Glaucoma  Neg Hx   . Macular degeneration Neg Hx   . Retinal detachment Neg Hx   . Strabismus Neg Hx   . Retinitis pigmentosa Neg Hx   . Cataracts Neg Hx     SOCIAL HISTORY Social History  Substance Use Topics  . Smoking status: Never Smoker  . Smokeless tobacco: Never Used  . Alcohol use No         OPHTHALMIC EXAM:  Base Eye Exam    Visual Acuity (Snellen - Linear)      Right Left   Dist [place] HM 20/100   Dist ph [place] NI NI       Tonometry (Tonopen, 9:21 AM)      Right Left   Pressure 21 13       Pupils      Dark Light Shape React APD   Right 5  Round NR None   Left 4 3 Round Brisk None       Visual Fields (Counting fingers)      Left Right    Full Full   Difficult to obtain; poor pt coop and understanding       Extraocular Movement      Right Left    Full, Ortho Full, Ortho       Neuro/Psych    Oriented x3:  Yes   Mood/Affect:  Normal       Dilation    Both eyes:  1.0% Mydriacyl, 2.5% Phenylephrine @ 9:21 AM        Slit Lamp and Fundus Exam    External Exam      Right Left   External Normal Normal       Slit Lamp Exam      Right Left   Lids/Lashes Dermatochalasis - upper lid Dermatochalasis - upper lid   Conjunctiva/Sclera White and quiet White and quiet   Cornea Arcus, 1+ Punctate epithelial erosions and haze Clear with tear film debris   Anterior Chamber deep; 0.5+ Cell / pigment Deep and quiet   Iris mild Transillumination defects; dilates to 5.15mm Round and dilated to 29mm   Lens inferiorly displaced PCIOL w/ plate style haptics Posterior chamber intraocular lens, 1+ Posterior capsular opacification   Vitreous Vitreous syneresis Vitreous syneresis, Posterior vitreous detachment       Fundus Exam      Right Left   Disc sharp; Pallor sharp; ,  Pallor   C/D Ratio 0.7 .55   Macula Scattered macular atrophy and RPE clumping; No heme or edema; , [place] central subretinal scar with pigment clumping; , [place]; subretinal scar with hemorrhage along superior arcade    Vessels Vascular attenuation Vascular attenuation   Periphery attached attached        Refraction    Manifest Refraction      Sphere Dist VA   Right [place] HM (NI)   Left [place] 20/100 (NI)   Difficult MRx; poor pt understanding          IMAGING AND PROCEDURES  Imaging and Procedures for 10/13/16  OCT, Retina - OU - Both Eyes     Right Eye Quality was good. Central Foveal Thickness: 475. Findings include abnormal foveal contour, pigment epithelial detachment, intraretinal fluid (Generalized retinal atrophy; subretinal hyer-reflective material / scarring; thin choroid).   Left Eye Quality was good. Central Foveal Thickness: 303. Findings include abnormal foveal contour, pigment epithelial detachment, no IRF (Generalized retinal atrophy; subretinal hyer-reflective material / scarring; thin choroid).   Notes Diagnosis / Impression:  Exudative ARMD  OU with extensive subretinal scarring   Clinical management:  See below  Abbreviations: NFP - Normal foveal profile. CME - cystoid macular edema. PED - pigment epithelial detachment. IRF - intraretinal fluid. SRF - subretinal fluid. EZ - ellipsoid zone. ERM - epiretinal membrane. ORA - outer retinal atrophy. ORT - outer retinal tubulation. SRHM - subretinal hyper-reflective material                ASSESSMENT/PLAN:    ICD-10-CM   1. Exudative age-related macular degeneration of both eyes with inactive scar ([place]) H35.3233 OCT, Retina - OU - Both Eyes  2. Dislocated IOL (intraocular lens), posterior, right H27.131   3. Diabetes mellitus type 2 without retinopathy ([place]) E11.9   4. Pseudophakia of both eyes Z96.1     1. Exudative age-related macular degeneration with subretinal scarring and GA OU -  - The  incidence pathology and anatomy of wet AMD discussed   - discussed treatment options including observation vs intravitreal anti-VEGF agents such as Avastin, Lucentis, Eylea.    - pt followed by Dr. Manuella Ghazi at [facility] and has appt scheduled in several weeks  - extensive atrophy OU limiting vision  - continue management per Dr. Manuella Ghazi  2. Displaced IOL OD  - pt reports history of backwards fall, but states decline in vision may have occurred prior to fall  - plate-style IOL displaced inferiorly  - suspect visual decline OD more related to ARMD than dislocated IOL  - discussed findings and opinion with patient  - unclear how beneficial repair of dislocated IOL would be given extent of scarring from ARMD  - at this time, IOP is controlled with topical drops  - pt states surgery was discussed with Dr. Manuella Ghazi -- advised continued follow up at [facility]  3. DM II w/o retinopathy  - Discussed importance of tight BG and BP control  4. Pseudophakia OU  - OD see above  - OS stable, monitor   Ophthalmic Meds Ordered this visit:  No orders of the defined types were placed in this encounter.      Return if symptoms worsen or fail to improve.  There are no Patient Instructions on file for this visit.   Explained the diagnoses, plan, and follow up with the patient and they expressed understanding.  Patient expressed understanding of the importance of proper follow up care.   Gardiner Sleeper, M.D., Ph.D. Vitreoretinal Surgeon [facility] 10/13/16     Abbreviations: M myopia (nearsighted); A astigmatism; H hyperopia (farsighted); P presbyopia; Mrx spectacle prescription;  CTL contact lenses; OD right eye; OS left eye; OU both eyes  XT exotropia; ET esotropia; PEK punctate epithelial keratitis; PEE punctate epithelial erosions; DES dry eye syndrome; MGD meibomian gland dysfunction; ATs artificial tears; PFAT's preservative free artificial tears; [place]  nuclear sclerotic cataract; PSC posterior subcapsular cataract; ERM epi-retinal membrane; PVD posterior vitreous detachment; RD retinal detachment; DM diabetes mellitus; DR diabetic retinopathy; NPDR non-proliferative diabetic retinopathy; PDR proliferative diabetic retinopathy; CSME clinically significant macular edema; DME diabetic macular edema; dbh dot blot hemorrhages; CWS cotton wool spot; POAG primary open angle glaucoma; C/D cup-to-disc ratio; HVF humphrey visual field; GVF goldmann visual field; OCT optical coherence tomography; IOP intraocular pressure; BRVO Branch retinal vein occlusion; CRVO central retinal vein occlusion; CRAO central retinal artery occlusion; BRAO branch retinal artery occlusion; RT retinal tear; SB scleral buckle; PPV pars plana vitrectomy; VH Vitreous hemorrhage; PRP panretinal laser photocoagulation; IVK intravitreal kenalog; VMT  vitreomacular traction; MH Macular hole;  NVD neovascularization of the disc; NVE neovascularization elsewhere; AREDS age related eye disease study; ARMD age related macular degeneration; POAG primary open angle glaucoma; EBMD epithelial/anterior basement membrane dystrophy; ACIOL anterior chamber intraocular lens; IOL intraocular lens; PCIOL posterior chamber intraocular lens; Phaco/IOL phacoemulsification with intraocular lens placement; [place] photorefractive keratectomy; LASIK laser assisted in situ keratomileusis; HTN hypertension; DM diabetes mellitus; COPD chronic obstructive pulmonary disease

## 2016-10-13 ENCOUNTER — Encounter (INDEPENDENT_AMBULATORY_CARE_PROVIDER_SITE_OTHER): Payer: Self-pay | Admitting: Ophthalmology

## 2016-10-13 ENCOUNTER — Ambulatory Visit (INDEPENDENT_AMBULATORY_CARE_PROVIDER_SITE_OTHER): Payer: Medicare Other | Admitting: Ophthalmology

## 2016-10-13 DIAGNOSIS — H353233 Exudative age-related macular degeneration, bilateral, with inactive scar: Secondary | ICD-10-CM

## 2016-10-13 DIAGNOSIS — Z961 Presence of intraocular lens: Secondary | ICD-10-CM

## 2016-10-13 DIAGNOSIS — H27131 Posterior dislocation of lens, right eye: Secondary | ICD-10-CM

## 2016-10-13 DIAGNOSIS — T8522XA Displacement of intraocular lens, initial encounter: Secondary | ICD-10-CM

## 2016-10-13 DIAGNOSIS — E119 Type 2 diabetes mellitus without complications: Secondary | ICD-10-CM

## 2016-10-22 DIAGNOSIS — J302 Other seasonal allergic rhinitis: Secondary | ICD-10-CM | POA: Diagnosis not present

## 2016-10-22 DIAGNOSIS — N400 Enlarged prostate without lower urinary tract symptoms: Secondary | ICD-10-CM | POA: Diagnosis not present

## 2016-10-22 DIAGNOSIS — I10 Essential (primary) hypertension: Secondary | ICD-10-CM | POA: Diagnosis not present

## 2016-10-22 DIAGNOSIS — W1839XA Other fall on same level, initial encounter: Secondary | ICD-10-CM | POA: Diagnosis not present

## 2016-10-22 DIAGNOSIS — R32 Unspecified urinary incontinence: Secondary | ICD-10-CM | POA: Diagnosis not present

## 2016-10-22 DIAGNOSIS — G20 Parkinson's disease: Secondary | ICD-10-CM | POA: Diagnosis not present

## 2016-10-22 DIAGNOSIS — K219 Gastro-esophageal reflux disease without esophagitis: Secondary | ICD-10-CM | POA: Diagnosis not present

## 2016-10-22 DIAGNOSIS — K5900 Constipation, unspecified: Secondary | ICD-10-CM | POA: Diagnosis not present

## 2016-10-22 DIAGNOSIS — N200 Calculus of kidney: Secondary | ICD-10-CM | POA: Diagnosis not present

## 2016-10-22 DIAGNOSIS — R05 Cough: Secondary | ICD-10-CM | POA: Diagnosis not present

## 2016-10-22 DIAGNOSIS — R4702 Dysphasia: Secondary | ICD-10-CM | POA: Diagnosis not present

## 2016-10-22 DIAGNOSIS — E119 Type 2 diabetes mellitus without complications: Secondary | ICD-10-CM | POA: Diagnosis not present

## 2016-10-22 DIAGNOSIS — R600 Localized edema: Secondary | ICD-10-CM | POA: Diagnosis not present

## 2016-10-22 DIAGNOSIS — E039 Hypothyroidism, unspecified: Secondary | ICD-10-CM | POA: Diagnosis not present

## 2016-11-02 DIAGNOSIS — G4700 Insomnia, unspecified: Secondary | ICD-10-CM | POA: Diagnosis not present

## 2016-11-03 DIAGNOSIS — K219 Gastro-esophageal reflux disease without esophagitis: Secondary | ICD-10-CM | POA: Diagnosis not present

## 2016-11-03 DIAGNOSIS — R05 Cough: Secondary | ICD-10-CM | POA: Diagnosis not present

## 2016-11-03 DIAGNOSIS — R0981 Nasal congestion: Secondary | ICD-10-CM | POA: Diagnosis not present

## 2016-11-03 DIAGNOSIS — J302 Other seasonal allergic rhinitis: Secondary | ICD-10-CM | POA: Diagnosis not present

## 2016-11-09 DIAGNOSIS — K5900 Constipation, unspecified: Secondary | ICD-10-CM | POA: Diagnosis not present

## 2016-11-09 DIAGNOSIS — K219 Gastro-esophageal reflux disease without esophagitis: Secondary | ICD-10-CM | POA: Diagnosis not present

## 2016-11-09 DIAGNOSIS — E119 Type 2 diabetes mellitus without complications: Secondary | ICD-10-CM | POA: Diagnosis not present

## 2016-11-09 DIAGNOSIS — I10 Essential (primary) hypertension: Secondary | ICD-10-CM | POA: Diagnosis not present

## 2016-11-09 DIAGNOSIS — R05 Cough: Secondary | ICD-10-CM | POA: Diagnosis not present

## 2016-11-09 DIAGNOSIS — E039 Hypothyroidism, unspecified: Secondary | ICD-10-CM | POA: Diagnosis not present

## 2016-11-09 DIAGNOSIS — G4700 Insomnia, unspecified: Secondary | ICD-10-CM | POA: Diagnosis not present

## 2016-11-09 DIAGNOSIS — H409 Unspecified glaucoma: Secondary | ICD-10-CM | POA: Diagnosis not present

## 2016-11-09 DIAGNOSIS — E559 Vitamin D deficiency, unspecified: Secondary | ICD-10-CM | POA: Diagnosis not present

## 2016-11-09 DIAGNOSIS — R600 Localized edema: Secondary | ICD-10-CM | POA: Diagnosis not present

## 2016-11-09 DIAGNOSIS — G20 Parkinson's disease: Secondary | ICD-10-CM | POA: Diagnosis not present

## 2016-11-09 DIAGNOSIS — N400 Enlarged prostate without lower urinary tract symptoms: Secondary | ICD-10-CM | POA: Diagnosis not present

## 2016-11-17 DIAGNOSIS — K219 Gastro-esophageal reflux disease without esophagitis: Secondary | ICD-10-CM | POA: Diagnosis not present

## 2016-11-17 DIAGNOSIS — E039 Hypothyroidism, unspecified: Secondary | ICD-10-CM | POA: Diagnosis not present

## 2016-11-17 DIAGNOSIS — R05 Cough: Secondary | ICD-10-CM | POA: Diagnosis not present

## 2016-11-17 DIAGNOSIS — N400 Enlarged prostate without lower urinary tract symptoms: Secondary | ICD-10-CM | POA: Diagnosis not present

## 2016-11-17 DIAGNOSIS — R4702 Dysphasia: Secondary | ICD-10-CM | POA: Diagnosis not present

## 2016-11-17 DIAGNOSIS — I10 Essential (primary) hypertension: Secondary | ICD-10-CM | POA: Diagnosis not present

## 2016-11-17 DIAGNOSIS — J302 Other seasonal allergic rhinitis: Secondary | ICD-10-CM | POA: Diagnosis not present

## 2016-11-17 DIAGNOSIS — K5900 Constipation, unspecified: Secondary | ICD-10-CM | POA: Diagnosis not present

## 2016-11-17 DIAGNOSIS — R600 Localized edema: Secondary | ICD-10-CM | POA: Diagnosis not present

## 2016-11-17 DIAGNOSIS — E119 Type 2 diabetes mellitus without complications: Secondary | ICD-10-CM | POA: Diagnosis not present

## 2016-11-17 DIAGNOSIS — G20 Parkinson's disease: Secondary | ICD-10-CM | POA: Diagnosis not present

## 2016-11-17 DIAGNOSIS — W1839XA Other fall on same level, initial encounter: Secondary | ICD-10-CM | POA: Diagnosis not present

## 2016-11-27 DIAGNOSIS — D518 Other vitamin B12 deficiency anemias: Secondary | ICD-10-CM | POA: Diagnosis not present

## 2016-11-27 DIAGNOSIS — E782 Mixed hyperlipidemia: Secondary | ICD-10-CM | POA: Diagnosis not present

## 2016-11-27 DIAGNOSIS — E559 Vitamin D deficiency, unspecified: Secondary | ICD-10-CM | POA: Diagnosis not present

## 2016-11-27 DIAGNOSIS — E119 Type 2 diabetes mellitus without complications: Secondary | ICD-10-CM | POA: Diagnosis not present

## 2016-11-27 DIAGNOSIS — I10 Essential (primary) hypertension: Secondary | ICD-10-CM | POA: Diagnosis not present

## 2016-11-27 DIAGNOSIS — E039 Hypothyroidism, unspecified: Secondary | ICD-10-CM | POA: Diagnosis not present

## 2016-12-08 DIAGNOSIS — E119 Type 2 diabetes mellitus without complications: Secondary | ICD-10-CM | POA: Diagnosis not present

## 2016-12-08 DIAGNOSIS — Z961 Presence of intraocular lens: Secondary | ICD-10-CM | POA: Diagnosis not present

## 2016-12-08 DIAGNOSIS — H353231 Exudative age-related macular degeneration, bilateral, with active choroidal neovascularization: Secondary | ICD-10-CM | POA: Diagnosis not present

## 2016-12-10 DIAGNOSIS — G20 Parkinson's disease: Secondary | ICD-10-CM | POA: Diagnosis not present

## 2016-12-10 DIAGNOSIS — R600 Localized edema: Secondary | ICD-10-CM | POA: Diagnosis not present

## 2016-12-10 DIAGNOSIS — E119 Type 2 diabetes mellitus without complications: Secondary | ICD-10-CM | POA: Diagnosis not present

## 2016-12-10 DIAGNOSIS — H409 Unspecified glaucoma: Secondary | ICD-10-CM | POA: Diagnosis not present

## 2016-12-10 DIAGNOSIS — K5900 Constipation, unspecified: Secondary | ICD-10-CM | POA: Diagnosis not present

## 2016-12-10 DIAGNOSIS — G4700 Insomnia, unspecified: Secondary | ICD-10-CM | POA: Diagnosis not present

## 2016-12-10 DIAGNOSIS — R05 Cough: Secondary | ICD-10-CM | POA: Diagnosis not present

## 2016-12-10 DIAGNOSIS — E039 Hypothyroidism, unspecified: Secondary | ICD-10-CM | POA: Diagnosis not present

## 2016-12-10 DIAGNOSIS — E559 Vitamin D deficiency, unspecified: Secondary | ICD-10-CM | POA: Diagnosis not present

## 2016-12-10 DIAGNOSIS — N400 Enlarged prostate without lower urinary tract symptoms: Secondary | ICD-10-CM | POA: Diagnosis not present

## 2016-12-10 DIAGNOSIS — K219 Gastro-esophageal reflux disease without esophagitis: Secondary | ICD-10-CM | POA: Diagnosis not present

## 2016-12-10 DIAGNOSIS — I10 Essential (primary) hypertension: Secondary | ICD-10-CM | POA: Diagnosis not present

## 2016-12-15 DIAGNOSIS — K5900 Constipation, unspecified: Secondary | ICD-10-CM | POA: Diagnosis not present

## 2016-12-15 DIAGNOSIS — N400 Enlarged prostate without lower urinary tract symptoms: Secondary | ICD-10-CM | POA: Diagnosis not present

## 2016-12-15 DIAGNOSIS — K219 Gastro-esophageal reflux disease without esophagitis: Secondary | ICD-10-CM | POA: Diagnosis not present

## 2016-12-15 DIAGNOSIS — R600 Localized edema: Secondary | ICD-10-CM | POA: Diagnosis not present

## 2016-12-15 DIAGNOSIS — E119 Type 2 diabetes mellitus without complications: Secondary | ICD-10-CM | POA: Diagnosis not present

## 2016-12-15 DIAGNOSIS — W1839XA Other fall on same level, initial encounter: Secondary | ICD-10-CM | POA: Diagnosis not present

## 2016-12-15 DIAGNOSIS — J302 Other seasonal allergic rhinitis: Secondary | ICD-10-CM | POA: Diagnosis not present

## 2016-12-15 DIAGNOSIS — E039 Hypothyroidism, unspecified: Secondary | ICD-10-CM | POA: Diagnosis not present

## 2016-12-15 DIAGNOSIS — R4702 Dysphasia: Secondary | ICD-10-CM | POA: Diagnosis not present

## 2016-12-15 DIAGNOSIS — R05 Cough: Secondary | ICD-10-CM | POA: Diagnosis not present

## 2016-12-15 DIAGNOSIS — I10 Essential (primary) hypertension: Secondary | ICD-10-CM | POA: Diagnosis not present

## 2016-12-15 DIAGNOSIS — G20 Parkinson's disease: Secondary | ICD-10-CM | POA: Diagnosis not present

## 2016-12-28 DIAGNOSIS — F5101 Primary insomnia: Secondary | ICD-10-CM | POA: Diagnosis not present

## 2017-01-01 DIAGNOSIS — E782 Mixed hyperlipidemia: Secondary | ICD-10-CM | POA: Diagnosis not present

## 2017-01-01 DIAGNOSIS — E559 Vitamin D deficiency, unspecified: Secondary | ICD-10-CM | POA: Diagnosis not present

## 2017-01-01 DIAGNOSIS — I10 Essential (primary) hypertension: Secondary | ICD-10-CM | POA: Diagnosis not present

## 2017-01-01 DIAGNOSIS — E039 Hypothyroidism, unspecified: Secondary | ICD-10-CM | POA: Diagnosis not present

## 2017-01-01 DIAGNOSIS — E119 Type 2 diabetes mellitus without complications: Secondary | ICD-10-CM | POA: Diagnosis not present

## 2017-01-01 DIAGNOSIS — D518 Other vitamin B12 deficiency anemias: Secondary | ICD-10-CM | POA: Diagnosis not present

## 2017-01-07 DIAGNOSIS — E119 Type 2 diabetes mellitus without complications: Secondary | ICD-10-CM | POA: Diagnosis not present

## 2017-01-07 DIAGNOSIS — K5900 Constipation, unspecified: Secondary | ICD-10-CM | POA: Diagnosis not present

## 2017-01-07 DIAGNOSIS — R600 Localized edema: Secondary | ICD-10-CM | POA: Diagnosis not present

## 2017-01-07 DIAGNOSIS — E039 Hypothyroidism, unspecified: Secondary | ICD-10-CM | POA: Diagnosis not present

## 2017-01-07 DIAGNOSIS — N400 Enlarged prostate without lower urinary tract symptoms: Secondary | ICD-10-CM | POA: Diagnosis not present

## 2017-01-07 DIAGNOSIS — G4700 Insomnia, unspecified: Secondary | ICD-10-CM | POA: Diagnosis not present

## 2017-01-07 DIAGNOSIS — H409 Unspecified glaucoma: Secondary | ICD-10-CM | POA: Diagnosis not present

## 2017-01-07 DIAGNOSIS — K219 Gastro-esophageal reflux disease without esophagitis: Secondary | ICD-10-CM | POA: Diagnosis not present

## 2017-01-07 DIAGNOSIS — E559 Vitamin D deficiency, unspecified: Secondary | ICD-10-CM | POA: Diagnosis not present

## 2017-01-07 DIAGNOSIS — G20 Parkinson's disease: Secondary | ICD-10-CM | POA: Diagnosis not present

## 2017-01-07 DIAGNOSIS — R05 Cough: Secondary | ICD-10-CM | POA: Diagnosis not present

## 2017-01-07 DIAGNOSIS — I10 Essential (primary) hypertension: Secondary | ICD-10-CM | POA: Diagnosis not present

## 2017-01-13 DIAGNOSIS — G20 Parkinson's disease: Secondary | ICD-10-CM | POA: Diagnosis not present

## 2017-01-13 DIAGNOSIS — N400 Enlarged prostate without lower urinary tract symptoms: Secondary | ICD-10-CM | POA: Diagnosis not present

## 2017-01-13 DIAGNOSIS — K5900 Constipation, unspecified: Secondary | ICD-10-CM | POA: Diagnosis not present

## 2017-01-13 DIAGNOSIS — W1839XA Other fall on same level, initial encounter: Secondary | ICD-10-CM | POA: Diagnosis not present

## 2017-01-13 DIAGNOSIS — M79669 Pain in unspecified lower leg: Secondary | ICD-10-CM | POA: Diagnosis not present

## 2017-01-13 DIAGNOSIS — I10 Essential (primary) hypertension: Secondary | ICD-10-CM | POA: Diagnosis not present

## 2017-01-13 DIAGNOSIS — J302 Other seasonal allergic rhinitis: Secondary | ICD-10-CM | POA: Diagnosis not present

## 2017-01-13 DIAGNOSIS — E039 Hypothyroidism, unspecified: Secondary | ICD-10-CM | POA: Diagnosis not present

## 2017-01-13 DIAGNOSIS — E119 Type 2 diabetes mellitus without complications: Secondary | ICD-10-CM | POA: Diagnosis not present

## 2017-01-13 DIAGNOSIS — K219 Gastro-esophageal reflux disease without esophagitis: Secondary | ICD-10-CM | POA: Diagnosis not present

## 2017-01-13 DIAGNOSIS — F5101 Primary insomnia: Secondary | ICD-10-CM | POA: Diagnosis not present

## 2017-01-13 DIAGNOSIS — R600 Localized edema: Secondary | ICD-10-CM | POA: Diagnosis not present

## 2017-01-13 DIAGNOSIS — R4702 Dysphasia: Secondary | ICD-10-CM | POA: Diagnosis not present

## 2017-01-19 DIAGNOSIS — M79606 Pain in leg, unspecified: Secondary | ICD-10-CM | POA: Diagnosis not present

## 2017-02-02 DIAGNOSIS — R05 Cough: Secondary | ICD-10-CM | POA: Diagnosis not present

## 2017-02-09 DIAGNOSIS — J302 Other seasonal allergic rhinitis: Secondary | ICD-10-CM | POA: Diagnosis not present

## 2017-02-09 DIAGNOSIS — R05 Cough: Secondary | ICD-10-CM | POA: Diagnosis not present

## 2017-02-09 DIAGNOSIS — G9009 Other idiopathic peripheral autonomic neuropathy: Secondary | ICD-10-CM | POA: Diagnosis not present

## 2017-02-09 DIAGNOSIS — Z0000 Encounter for general adult medical examination without abnormal findings: Secondary | ICD-10-CM | POA: Diagnosis not present

## 2017-02-11 DIAGNOSIS — G20 Parkinson's disease: Secondary | ICD-10-CM | POA: Diagnosis not present

## 2017-02-11 DIAGNOSIS — E039 Hypothyroidism, unspecified: Secondary | ICD-10-CM | POA: Diagnosis not present

## 2017-02-11 DIAGNOSIS — F5101 Primary insomnia: Secondary | ICD-10-CM | POA: Diagnosis not present

## 2017-02-11 DIAGNOSIS — G4700 Insomnia, unspecified: Secondary | ICD-10-CM | POA: Diagnosis not present

## 2017-02-11 DIAGNOSIS — N400 Enlarged prostate without lower urinary tract symptoms: Secondary | ICD-10-CM | POA: Diagnosis not present

## 2017-02-11 DIAGNOSIS — K5900 Constipation, unspecified: Secondary | ICD-10-CM | POA: Diagnosis not present

## 2017-02-11 DIAGNOSIS — E119 Type 2 diabetes mellitus without complications: Secondary | ICD-10-CM | POA: Diagnosis not present

## 2017-02-11 DIAGNOSIS — H409 Unspecified glaucoma: Secondary | ICD-10-CM | POA: Diagnosis not present

## 2017-02-11 DIAGNOSIS — E559 Vitamin D deficiency, unspecified: Secondary | ICD-10-CM | POA: Diagnosis not present

## 2017-02-11 DIAGNOSIS — R05 Cough: Secondary | ICD-10-CM | POA: Diagnosis not present

## 2017-02-11 DIAGNOSIS — I10 Essential (primary) hypertension: Secondary | ICD-10-CM | POA: Diagnosis not present

## 2017-02-11 DIAGNOSIS — K219 Gastro-esophageal reflux disease without esophagitis: Secondary | ICD-10-CM | POA: Diagnosis not present

## 2017-02-25 DIAGNOSIS — R05 Cough: Secondary | ICD-10-CM | POA: Diagnosis not present

## 2017-03-02 DIAGNOSIS — G4700 Insomnia, unspecified: Secondary | ICD-10-CM | POA: Diagnosis not present

## 2017-03-02 DIAGNOSIS — R0602 Shortness of breath: Secondary | ICD-10-CM | POA: Diagnosis not present

## 2017-03-02 DIAGNOSIS — F4321 Adjustment disorder with depressed mood: Secondary | ICD-10-CM | POA: Diagnosis not present

## 2017-03-02 DIAGNOSIS — R05 Cough: Secondary | ICD-10-CM | POA: Diagnosis not present

## 2017-03-02 DIAGNOSIS — G9009 Other idiopathic peripheral autonomic neuropathy: Secondary | ICD-10-CM | POA: Diagnosis not present

## 2017-03-02 DIAGNOSIS — M79669 Pain in unspecified lower leg: Secondary | ICD-10-CM | POA: Diagnosis not present

## 2017-03-02 DIAGNOSIS — G894 Chronic pain syndrome: Secondary | ICD-10-CM | POA: Diagnosis not present

## 2017-03-02 DIAGNOSIS — J111 Influenza due to unidentified influenza virus with other respiratory manifestations: Secondary | ICD-10-CM | POA: Diagnosis not present

## 2017-03-08 DIAGNOSIS — Z79899 Other long term (current) drug therapy: Secondary | ICD-10-CM | POA: Diagnosis not present

## 2017-03-08 DIAGNOSIS — F5101 Primary insomnia: Secondary | ICD-10-CM | POA: Diagnosis not present

## 2017-03-09 DIAGNOSIS — E119 Type 2 diabetes mellitus without complications: Secondary | ICD-10-CM | POA: Diagnosis not present

## 2017-03-09 DIAGNOSIS — M79669 Pain in unspecified lower leg: Secondary | ICD-10-CM | POA: Diagnosis not present

## 2017-03-09 DIAGNOSIS — K219 Gastro-esophageal reflux disease without esophagitis: Secondary | ICD-10-CM | POA: Diagnosis not present

## 2017-03-09 DIAGNOSIS — J302 Other seasonal allergic rhinitis: Secondary | ICD-10-CM | POA: Diagnosis not present

## 2017-03-09 DIAGNOSIS — R05 Cough: Secondary | ICD-10-CM | POA: Diagnosis not present

## 2017-03-09 DIAGNOSIS — I10 Essential (primary) hypertension: Secondary | ICD-10-CM | POA: Diagnosis not present

## 2017-03-09 DIAGNOSIS — R600 Localized edema: Secondary | ICD-10-CM | POA: Diagnosis not present

## 2017-03-09 DIAGNOSIS — J111 Influenza due to unidentified influenza virus with other respiratory manifestations: Secondary | ICD-10-CM | POA: Diagnosis not present

## 2017-03-09 DIAGNOSIS — G20 Parkinson's disease: Secondary | ICD-10-CM | POA: Diagnosis not present

## 2017-03-09 DIAGNOSIS — N400 Enlarged prostate without lower urinary tract symptoms: Secondary | ICD-10-CM | POA: Diagnosis not present

## 2017-03-09 DIAGNOSIS — E039 Hypothyroidism, unspecified: Secondary | ICD-10-CM | POA: Diagnosis not present

## 2017-03-09 DIAGNOSIS — K5900 Constipation, unspecified: Secondary | ICD-10-CM | POA: Diagnosis not present

## 2017-03-10 DIAGNOSIS — G4700 Insomnia, unspecified: Secondary | ICD-10-CM | POA: Diagnosis not present

## 2017-03-10 DIAGNOSIS — K5900 Constipation, unspecified: Secondary | ICD-10-CM | POA: Diagnosis not present

## 2017-03-10 DIAGNOSIS — I10 Essential (primary) hypertension: Secondary | ICD-10-CM | POA: Diagnosis not present

## 2017-03-10 DIAGNOSIS — R05 Cough: Secondary | ICD-10-CM | POA: Diagnosis not present

## 2017-03-10 DIAGNOSIS — K219 Gastro-esophageal reflux disease without esophagitis: Secondary | ICD-10-CM | POA: Diagnosis not present

## 2017-03-10 DIAGNOSIS — E559 Vitamin D deficiency, unspecified: Secondary | ICD-10-CM | POA: Diagnosis not present

## 2017-03-10 DIAGNOSIS — E119 Type 2 diabetes mellitus without complications: Secondary | ICD-10-CM | POA: Diagnosis not present

## 2017-03-10 DIAGNOSIS — F5101 Primary insomnia: Secondary | ICD-10-CM | POA: Diagnosis not present

## 2017-03-10 DIAGNOSIS — G20 Parkinson's disease: Secondary | ICD-10-CM | POA: Diagnosis not present

## 2017-03-10 DIAGNOSIS — E039 Hypothyroidism, unspecified: Secondary | ICD-10-CM | POA: Diagnosis not present

## 2017-03-10 DIAGNOSIS — N400 Enlarged prostate without lower urinary tract symptoms: Secondary | ICD-10-CM | POA: Diagnosis not present

## 2017-03-10 DIAGNOSIS — H409 Unspecified glaucoma: Secondary | ICD-10-CM | POA: Diagnosis not present

## 2017-03-16 DIAGNOSIS — F4321 Adjustment disorder with depressed mood: Secondary | ICD-10-CM | POA: Diagnosis not present

## 2017-03-16 DIAGNOSIS — G4700 Insomnia, unspecified: Secondary | ICD-10-CM | POA: Diagnosis not present

## 2017-03-26 DIAGNOSIS — E039 Hypothyroidism, unspecified: Secondary | ICD-10-CM | POA: Diagnosis not present

## 2017-03-30 DIAGNOSIS — F4321 Adjustment disorder with depressed mood: Secondary | ICD-10-CM | POA: Diagnosis not present

## 2017-03-30 DIAGNOSIS — G9009 Other idiopathic peripheral autonomic neuropathy: Secondary | ICD-10-CM | POA: Diagnosis not present

## 2017-03-30 DIAGNOSIS — G4700 Insomnia, unspecified: Secondary | ICD-10-CM | POA: Diagnosis not present

## 2017-03-30 DIAGNOSIS — M79669 Pain in unspecified lower leg: Secondary | ICD-10-CM | POA: Diagnosis not present

## 2017-03-30 DIAGNOSIS — G894 Chronic pain syndrome: Secondary | ICD-10-CM | POA: Diagnosis not present

## 2017-04-05 DIAGNOSIS — F5101 Primary insomnia: Secondary | ICD-10-CM | POA: Diagnosis not present

## 2017-04-05 DIAGNOSIS — G3184 Mild cognitive impairment, so stated: Secondary | ICD-10-CM | POA: Diagnosis not present

## 2017-04-06 DIAGNOSIS — R05 Cough: Secondary | ICD-10-CM | POA: Diagnosis not present

## 2017-04-06 DIAGNOSIS — E119 Type 2 diabetes mellitus without complications: Secondary | ICD-10-CM | POA: Diagnosis not present

## 2017-04-06 DIAGNOSIS — M79669 Pain in unspecified lower leg: Secondary | ICD-10-CM | POA: Diagnosis not present

## 2017-04-06 DIAGNOSIS — N400 Enlarged prostate without lower urinary tract symptoms: Secondary | ICD-10-CM | POA: Diagnosis not present

## 2017-04-06 DIAGNOSIS — G20 Parkinson's disease: Secondary | ICD-10-CM | POA: Diagnosis not present

## 2017-04-06 DIAGNOSIS — J302 Other seasonal allergic rhinitis: Secondary | ICD-10-CM | POA: Diagnosis not present

## 2017-04-06 DIAGNOSIS — I10 Essential (primary) hypertension: Secondary | ICD-10-CM | POA: Diagnosis not present

## 2017-04-06 DIAGNOSIS — R4702 Dysphasia: Secondary | ICD-10-CM | POA: Diagnosis not present

## 2017-04-06 DIAGNOSIS — R600 Localized edema: Secondary | ICD-10-CM | POA: Diagnosis not present

## 2017-04-06 DIAGNOSIS — K5900 Constipation, unspecified: Secondary | ICD-10-CM | POA: Diagnosis not present

## 2017-04-06 DIAGNOSIS — E039 Hypothyroidism, unspecified: Secondary | ICD-10-CM | POA: Diagnosis not present

## 2017-04-06 DIAGNOSIS — K219 Gastro-esophageal reflux disease without esophagitis: Secondary | ICD-10-CM | POA: Diagnosis not present

## 2017-04-09 DIAGNOSIS — E559 Vitamin D deficiency, unspecified: Secondary | ICD-10-CM | POA: Diagnosis not present

## 2017-04-09 DIAGNOSIS — F5101 Primary insomnia: Secondary | ICD-10-CM | POA: Diagnosis not present

## 2017-04-09 DIAGNOSIS — R05 Cough: Secondary | ICD-10-CM | POA: Diagnosis not present

## 2017-04-09 DIAGNOSIS — G20 Parkinson's disease: Secondary | ICD-10-CM | POA: Diagnosis not present

## 2017-04-09 DIAGNOSIS — E039 Hypothyroidism, unspecified: Secondary | ICD-10-CM | POA: Diagnosis not present

## 2017-04-09 DIAGNOSIS — E119 Type 2 diabetes mellitus without complications: Secondary | ICD-10-CM | POA: Diagnosis not present

## 2017-04-09 DIAGNOSIS — K219 Gastro-esophageal reflux disease without esophagitis: Secondary | ICD-10-CM | POA: Diagnosis not present

## 2017-04-09 DIAGNOSIS — G4700 Insomnia, unspecified: Secondary | ICD-10-CM | POA: Diagnosis not present

## 2017-04-09 DIAGNOSIS — K5900 Constipation, unspecified: Secondary | ICD-10-CM | POA: Diagnosis not present

## 2017-04-09 DIAGNOSIS — H409 Unspecified glaucoma: Secondary | ICD-10-CM | POA: Diagnosis not present

## 2017-04-09 DIAGNOSIS — N400 Enlarged prostate without lower urinary tract symptoms: Secondary | ICD-10-CM | POA: Diagnosis not present

## 2017-04-09 DIAGNOSIS — I10 Essential (primary) hypertension: Secondary | ICD-10-CM | POA: Diagnosis not present

## 2017-04-20 DIAGNOSIS — F4321 Adjustment disorder with depressed mood: Secondary | ICD-10-CM | POA: Diagnosis not present

## 2017-04-20 DIAGNOSIS — G4700 Insomnia, unspecified: Secondary | ICD-10-CM | POA: Diagnosis not present

## 2017-04-27 DIAGNOSIS — M79669 Pain in unspecified lower leg: Secondary | ICD-10-CM | POA: Diagnosis not present

## 2017-04-27 DIAGNOSIS — G894 Chronic pain syndrome: Secondary | ICD-10-CM | POA: Diagnosis not present

## 2017-04-27 DIAGNOSIS — G9009 Other idiopathic peripheral autonomic neuropathy: Secondary | ICD-10-CM | POA: Diagnosis not present

## 2017-05-11 DIAGNOSIS — J111 Influenza due to unidentified influenza virus with other respiratory manifestations: Secondary | ICD-10-CM | POA: Diagnosis not present

## 2017-05-11 DIAGNOSIS — M79669 Pain in unspecified lower leg: Secondary | ICD-10-CM | POA: Diagnosis not present

## 2017-05-11 DIAGNOSIS — G20 Parkinson's disease: Secondary | ICD-10-CM | POA: Diagnosis not present

## 2017-05-11 DIAGNOSIS — J302 Other seasonal allergic rhinitis: Secondary | ICD-10-CM | POA: Diagnosis not present

## 2017-05-11 DIAGNOSIS — R600 Localized edema: Secondary | ICD-10-CM | POA: Diagnosis not present

## 2017-05-11 DIAGNOSIS — N400 Enlarged prostate without lower urinary tract symptoms: Secondary | ICD-10-CM | POA: Diagnosis not present

## 2017-05-11 DIAGNOSIS — E039 Hypothyroidism, unspecified: Secondary | ICD-10-CM | POA: Diagnosis not present

## 2017-05-11 DIAGNOSIS — E119 Type 2 diabetes mellitus without complications: Secondary | ICD-10-CM | POA: Diagnosis not present

## 2017-05-11 DIAGNOSIS — Z79899 Other long term (current) drug therapy: Secondary | ICD-10-CM | POA: Diagnosis not present

## 2017-05-11 DIAGNOSIS — K5900 Constipation, unspecified: Secondary | ICD-10-CM | POA: Diagnosis not present

## 2017-05-11 DIAGNOSIS — R05 Cough: Secondary | ICD-10-CM | POA: Diagnosis not present

## 2017-05-11 DIAGNOSIS — I10 Essential (primary) hypertension: Secondary | ICD-10-CM | POA: Diagnosis not present

## 2017-05-11 DIAGNOSIS — K219 Gastro-esophageal reflux disease without esophagitis: Secondary | ICD-10-CM | POA: Diagnosis not present

## 2017-05-14 DIAGNOSIS — E039 Hypothyroidism, unspecified: Secondary | ICD-10-CM | POA: Diagnosis not present

## 2017-05-14 DIAGNOSIS — E782 Mixed hyperlipidemia: Secondary | ICD-10-CM | POA: Diagnosis not present

## 2017-05-14 DIAGNOSIS — I10 Essential (primary) hypertension: Secondary | ICD-10-CM | POA: Diagnosis not present

## 2017-05-18 DIAGNOSIS — G4700 Insomnia, unspecified: Secondary | ICD-10-CM | POA: Diagnosis not present

## 2017-05-18 DIAGNOSIS — F4321 Adjustment disorder with depressed mood: Secondary | ICD-10-CM | POA: Diagnosis not present

## 2017-05-21 DIAGNOSIS — M79669 Pain in unspecified lower leg: Secondary | ICD-10-CM | POA: Diagnosis not present

## 2017-05-21 DIAGNOSIS — G894 Chronic pain syndrome: Secondary | ICD-10-CM | POA: Diagnosis not present

## 2017-05-21 DIAGNOSIS — G9009 Other idiopathic peripheral autonomic neuropathy: Secondary | ICD-10-CM | POA: Diagnosis not present

## 2017-05-25 DIAGNOSIS — R600 Localized edema: Secondary | ICD-10-CM | POA: Diagnosis not present

## 2017-05-25 DIAGNOSIS — J302 Other seasonal allergic rhinitis: Secondary | ICD-10-CM | POA: Diagnosis not present

## 2017-05-25 DIAGNOSIS — K5900 Constipation, unspecified: Secondary | ICD-10-CM | POA: Diagnosis not present

## 2017-05-25 DIAGNOSIS — G20 Parkinson's disease: Secondary | ICD-10-CM | POA: Diagnosis not present

## 2017-05-25 DIAGNOSIS — E039 Hypothyroidism, unspecified: Secondary | ICD-10-CM | POA: Diagnosis not present

## 2017-05-25 DIAGNOSIS — M79669 Pain in unspecified lower leg: Secondary | ICD-10-CM | POA: Diagnosis not present

## 2017-05-25 DIAGNOSIS — K219 Gastro-esophageal reflux disease without esophagitis: Secondary | ICD-10-CM | POA: Diagnosis not present

## 2017-05-25 DIAGNOSIS — I10 Essential (primary) hypertension: Secondary | ICD-10-CM | POA: Diagnosis not present

## 2017-05-25 DIAGNOSIS — E119 Type 2 diabetes mellitus without complications: Secondary | ICD-10-CM | POA: Diagnosis not present

## 2017-05-25 DIAGNOSIS — R05 Cough: Secondary | ICD-10-CM | POA: Diagnosis not present

## 2017-05-25 DIAGNOSIS — N400 Enlarged prostate without lower urinary tract symptoms: Secondary | ICD-10-CM | POA: Diagnosis not present

## 2017-05-25 DIAGNOSIS — J111 Influenza due to unidentified influenza virus with other respiratory manifestations: Secondary | ICD-10-CM | POA: Diagnosis not present

## 2017-05-31 DIAGNOSIS — Z961 Presence of intraocular lens: Secondary | ICD-10-CM | POA: Insufficient documentation

## 2017-06-01 DIAGNOSIS — H353231 Exudative age-related macular degeneration, bilateral, with active choroidal neovascularization: Secondary | ICD-10-CM | POA: Diagnosis not present

## 2017-06-01 DIAGNOSIS — Z961 Presence of intraocular lens: Secondary | ICD-10-CM | POA: Diagnosis not present

## 2017-06-01 DIAGNOSIS — G4700 Insomnia, unspecified: Secondary | ICD-10-CM | POA: Diagnosis not present

## 2017-06-01 DIAGNOSIS — F4321 Adjustment disorder with depressed mood: Secondary | ICD-10-CM | POA: Diagnosis not present

## 2017-06-01 DIAGNOSIS — E119 Type 2 diabetes mellitus without complications: Secondary | ICD-10-CM | POA: Diagnosis not present

## 2017-06-08 DIAGNOSIS — G4700 Insomnia, unspecified: Secondary | ICD-10-CM | POA: Diagnosis not present

## 2017-06-08 DIAGNOSIS — F4321 Adjustment disorder with depressed mood: Secondary | ICD-10-CM | POA: Diagnosis not present

## 2017-06-11 DIAGNOSIS — E559 Vitamin D deficiency, unspecified: Secondary | ICD-10-CM | POA: Diagnosis not present

## 2017-06-11 DIAGNOSIS — N400 Enlarged prostate without lower urinary tract symptoms: Secondary | ICD-10-CM | POA: Diagnosis not present

## 2017-06-11 DIAGNOSIS — K219 Gastro-esophageal reflux disease without esophagitis: Secondary | ICD-10-CM | POA: Diagnosis not present

## 2017-06-11 DIAGNOSIS — G3184 Mild cognitive impairment, so stated: Secondary | ICD-10-CM | POA: Diagnosis not present

## 2017-06-11 DIAGNOSIS — E039 Hypothyroidism, unspecified: Secondary | ICD-10-CM | POA: Diagnosis not present

## 2017-06-11 DIAGNOSIS — G20 Parkinson's disease: Secondary | ICD-10-CM | POA: Diagnosis not present

## 2017-06-11 DIAGNOSIS — F5101 Primary insomnia: Secondary | ICD-10-CM | POA: Diagnosis not present

## 2017-06-11 DIAGNOSIS — E119 Type 2 diabetes mellitus without complications: Secondary | ICD-10-CM | POA: Diagnosis not present

## 2017-06-11 DIAGNOSIS — I10 Essential (primary) hypertension: Secondary | ICD-10-CM | POA: Diagnosis not present

## 2017-06-11 DIAGNOSIS — K5900 Constipation, unspecified: Secondary | ICD-10-CM | POA: Diagnosis not present

## 2017-06-11 DIAGNOSIS — H409 Unspecified glaucoma: Secondary | ICD-10-CM | POA: Diagnosis not present

## 2017-06-11 DIAGNOSIS — G4700 Insomnia, unspecified: Secondary | ICD-10-CM | POA: Diagnosis not present

## 2017-06-15 ENCOUNTER — Ambulatory Visit (INDEPENDENT_AMBULATORY_CARE_PROVIDER_SITE_OTHER): Payer: Medicare Other

## 2017-06-15 ENCOUNTER — Ambulatory Visit (INDEPENDENT_AMBULATORY_CARE_PROVIDER_SITE_OTHER): Payer: Medicare Other | Admitting: Orthopedic Surgery

## 2017-06-15 ENCOUNTER — Encounter (INDEPENDENT_AMBULATORY_CARE_PROVIDER_SITE_OTHER): Payer: Self-pay | Admitting: Orthopedic Surgery

## 2017-06-15 DIAGNOSIS — M25532 Pain in left wrist: Secondary | ICD-10-CM | POA: Diagnosis not present

## 2017-06-15 NOTE — Progress Notes (Signed)
Office Visit Note   Patient: [name]           Date of Birth: Dec 07, 1931           MRN: [id number] Visit Date: 06/15/2017              Requested by: Lajean Manes, MD [address] [place], [place] [address] PCP: Lajean Manes, MD  Chief Complaint  Patient presents with  . Left Wrist - Edema      HPI: Patient is a 84 year old gentleman who was seen for evaluation of left wrist pain.  Patient states he has had multiple falls due to a recent diagnosis of Parkinson's.  He states that he fell about 2 months ago and has been having on and off wrist pain since that time.  Assessment & Plan: Visit Diagnoses:  1. Pain in left wrist     Plan: Recommend patient could try nonsteroidal anti-inflammatories.  Patient states he is not interested in medication at this time.  He will continue with his activities as tolerated without restrictions.  Follow-Up Instructions: Return if symptoms worsen or fail to improve.   Ortho Exam  Patient is alert, oriented, no adenopathy, well-dressed, normal affect, normal respiratory effort. Examination patient does have some swelling around the wrist there is no redness there is no tenderness to palpation no clinical signs of gout.  The hand has good range of motion patient has some mild tenderness to palpation of the scaphoid scapholunate or TFCC radiographs shows no evidence of a scaphoid fracture.  There is degenerative changes and swelling around the carpal metacarpal joint of the thumb but there is no instability.  Imaging: Xr Wrist Complete Left  Result Date: 06/15/2017 3 view radiographs of left wrist shows degenerative arthritis of the wrist as well as degenerative changes of the thumb carpometacarpal joint.  No evidence of acute fracture he does have calcification of the arteries.  No images are attached to the encounter.  Labs: Lab Results  Component Value Date   HGBA1C 6.4 (H) 10/24/2015   HGBA1C 6.2 (H) 10/15/2014   HGBA1C 6.9 (H) 04/02/2011   REPTSTATUS 04/25/2016 FINAL 04/24/2016   CULT  04/24/2016    NO GROWTH Performed at [facility], [place] [address], [place], [place] [address]      Lab Results  Component Value Date   ALBUMIN 4.0 01/25/2016   ALBUMIN 3.7 10/24/2015   ALBUMIN 3.5 10/23/2015    There is no height or weight on file to calculate BMI.  Orders:  Orders Placed This Encounter  Procedures  . XR Wrist Complete Left   No orders of the defined types were placed in this encounter.    Procedures: No procedures performed  Clinical Data: No additional findings.  ROS:  All other systems negative, except as noted in the HPI. Review of Systems  Objective: Vital Signs: There were no vitals taken for this visit.  Specialty Comments:  No specialty comments available.  PMFS History: Patient Active Problem List   Diagnosis Date Noted  . Retained ureteral stent 04/22/2016  . Ureteral calculus 01/25/2016  . Mental status change 10/24/2015  . Encephalopathy 10/24/2015  . Hypothermia 10/24/2015  . Chronic renal insufficiency 10/24/2015  . Benign essential HTN 10/24/2015  . Hypoglycemia 10/24/2015  . Acute encephalopathy 10/24/2015  . CVA (cerebral vascular accident) ([place]) 10/14/2014  . Prerenal azotemia 10/14/2014  . Mild HTN 10/14/2014  . CVA (cerebral infarction)   . TIA (transient ischemic attack)   .  PNAR (perennial non-allergic rhinitis) 05/23/2013  . Seasonal and perennial allergic rhinitis 06/11/2012  . Acute bronchitis due to infection 01/14/2011  . Insomnia, unspecified 05/19/2010  . ATAXIA 03/17/2010  . DIZZINESS 03/06/2010  . Chronic constipation 02/21/2008  . OSTEOARTHRITIS, KNEE, SEVERE 01/12/2008  . HYPERLIPIDEMIA 01/17/2007  . Hypothyroidism (post surgical) 10/15/2006  . SKIN CANCER, HX OF 06/08/2006  . Diabetes mellitus, insulin dependent (IDDM), controlled ([place]) 04/22/2006  . URTICARIA 04/22/2006  . POPLITEAL CYST 04/22/2006  .  THYROIDECTOMY, SUBTOTAL, HX OF 04/22/2006  . TOTAL KNEE REPLACEMENT, HX OF 04/22/2006   Past Medical History:  Diagnosis Date  . Allergic rhinitis   . Arthritis    arthritis-kyphosis  . Asthmatic bronchitis   . Chronic kidney disease   . DM type 2 (diabetes mellitus, type 2) ([place])   . DVT, lower extremity, proximal, acute ([place])   . Dyslipidemia   . Dysrhythmia    '97 tachycardia- no recent issues  . GERD (gastroesophageal reflux disease)   . Hearing impaired    bilateral hearing aids  . History of kidney stones   . History of skin cancer   . Hypertension   . Hypothyroidism   . Macular degeneration    injections done bilaterally recent - 08-22-13  . Prostate cancer [facility])    Prostate cancer-radiation only,skin cancer-basal cell and last squamous cell right eye  . Stroke ([place])   . Urgency-frequency syndrome   . Urticaria     Family History  Problem Relation Age of Onset  . Other Sister        ophth disease  . Heart attack Father        x7  . Heart disease Father        MI x7  . Hypertension Mother   . Stroke Mother 69  . Amblyopia Neg Hx   . Blindness Neg Hx   . Glaucoma Neg Hx   . Macular degeneration Neg Hx   . Retinal detachment Neg Hx   . Strabismus Neg Hx   . Retinitis pigmentosa Neg Hx   . Cataracts Neg Hx     Past Surgical History:  Procedure Laterality Date  . BACK SURGERY     lumbar fracture  . cataract surgery Bilateral   . COLONOSCOPY W/ POLYPECTOMY     x2   . CYSTOSCOPY WITH INJECTION N/A 09/11/2013   Procedure: CYSTOSCOPY WITH BOTOX INJECTION INTO THE BLADDER;  Surgeon: Ailene Rud, MD;  [facility];  Service: Urology;  Laterality: N/A;  . CYSTOSCOPY WITH RETROGRADE PYELOGRAM, URETEROSCOPY AND STENT PLACEMENT    . CYSTOSCOPY WITH STENT PLACEMENT Right 01/26/2016   Procedure: CYSTOSCOPY, RIGHT RETROGRADE WITH RIGHT URETERAL STENT PLACEMENT;  Surgeon: Cleon Gustin, MD;  [facility];  Service: Urology;  Laterality: Right;  .  CYSTOSCOPY/RETROGRADE/URETEROSCOPY Right 04/22/2016   Procedure: CYSTOSCOPY/RETROGRADE/ REMOVAL JJ STENT right insertion stent right insertion of foley;  Surgeon: Franchot Gallo, MD;  [facility];  Service: Urology;  Laterality: Right;  . EXTRACORPOREAL SHOCK WAVE LITHOTRIPSY Right 02/27/2016   Procedure: RIGHT EXTRACORPOREAL SHOCK WAVE LITHOTRIPSY (ESWL) GAITED;  Surgeon: Cleon Gustin, MD;  [facility];  Service: Urology;  Laterality: Right;  . EXTRACORPOREAL SHOCK WAVE LITHOTRIPSY Right 02/10/2016   Procedure: EXTRACORPOREAL SHOCK WAVE LITHOTRIPSY (ESWL);  Surgeon: Kathie Rhodes, MD;  [facility];  Service: Urology;  Laterality: Right;  [phone] [id number] A BCBS- [id number]   . EYE SURGERY     macular degeneration  . GALLBLADDER SURGERY  2006   no cholecystectomy  . HERNIA REPAIR    . LUNG REMOVAL, PARTIAL  age 43   for bronchiectasis  . PARTIAL THYMECTOMY  1985   for nodules  . POPLITEAL SYNOVIAL CYST EXCISION  2005  . TOTAL KNEE ARTHROPLASTY  2008   right  . TOTAL KNEE ARTHROPLASTY  2010   left   Social History   Occupational History  . Occupation: retired  Tobacco Use  . Smoking status: Never Smoker  . Smokeless tobacco: Never Used  Substance and Sexual Activity  . Alcohol use: No  . Drug use: No  . Sexual activity: Not Currently

## 2017-06-17 ENCOUNTER — Ambulatory Visit (INDEPENDENT_AMBULATORY_CARE_PROVIDER_SITE_OTHER): Payer: Medicare Other | Admitting: Podiatry

## 2017-06-17 ENCOUNTER — Encounter: Payer: Self-pay | Admitting: Podiatry

## 2017-06-17 DIAGNOSIS — L03032 Cellulitis of left toe: Secondary | ICD-10-CM | POA: Diagnosis not present

## 2017-06-17 NOTE — Patient Instructions (Signed)

## 2017-06-20 NOTE — Progress Notes (Signed)
Subjective:   Patient ID: [name], male   DOB: 84 y.o.   MRN: [id number]   HPI Patient presents with ingrown toenail deformity with incurvation of the left hallux medial lateral border with distal redness noted with no proximal edema erythema noted currently but he patient states he has a lot of pain   ROS      Objective:  Physical Exam  Neurovascular status unchanged with diminishment of circulatory status bilateral with patient found to have incurvation of the hallux nail left with pain in the corners distal redness with slight drainage noted that is localized with no proximal edema erythema     Assessment:  Paronychia infection left with localized drainage process and circulatory disease     Plan:  Continue to monitor circulatory disease which she does in today I infiltrated the left hallux 60 mg like Marcaine mixture with sterile instrumentation I remove the medial lateral borders flush the corners exposed tissue and cleaned this up currently.  Advised on soaks and reappoint to recheck and sterile dressing applied today

## 2017-06-22 DIAGNOSIS — G894 Chronic pain syndrome: Secondary | ICD-10-CM | POA: Diagnosis not present

## 2017-06-28 DIAGNOSIS — Z79899 Other long term (current) drug therapy: Secondary | ICD-10-CM | POA: Diagnosis not present

## 2017-07-06 DIAGNOSIS — J111 Influenza due to unidentified influenza virus with other respiratory manifestations: Secondary | ICD-10-CM | POA: Diagnosis not present

## 2017-07-06 DIAGNOSIS — E782 Mixed hyperlipidemia: Secondary | ICD-10-CM | POA: Diagnosis not present

## 2017-07-06 DIAGNOSIS — K219 Gastro-esophageal reflux disease without esophagitis: Secondary | ICD-10-CM | POA: Diagnosis not present

## 2017-07-06 DIAGNOSIS — E119 Type 2 diabetes mellitus without complications: Secondary | ICD-10-CM | POA: Diagnosis not present

## 2017-07-06 DIAGNOSIS — G20 Parkinson's disease: Secondary | ICD-10-CM | POA: Diagnosis not present

## 2017-07-06 DIAGNOSIS — M79669 Pain in unspecified lower leg: Secondary | ICD-10-CM | POA: Diagnosis not present

## 2017-07-06 DIAGNOSIS — E039 Hypothyroidism, unspecified: Secondary | ICD-10-CM | POA: Diagnosis not present

## 2017-07-06 DIAGNOSIS — N400 Enlarged prostate without lower urinary tract symptoms: Secondary | ICD-10-CM | POA: Diagnosis not present

## 2017-07-06 DIAGNOSIS — R600 Localized edema: Secondary | ICD-10-CM | POA: Diagnosis not present

## 2017-07-06 DIAGNOSIS — R05 Cough: Secondary | ICD-10-CM | POA: Diagnosis not present

## 2017-07-06 DIAGNOSIS — K5900 Constipation, unspecified: Secondary | ICD-10-CM | POA: Diagnosis not present

## 2017-07-06 DIAGNOSIS — I10 Essential (primary) hypertension: Secondary | ICD-10-CM | POA: Diagnosis not present

## 2017-07-09 DIAGNOSIS — E119 Type 2 diabetes mellitus without complications: Secondary | ICD-10-CM | POA: Diagnosis not present

## 2017-07-09 DIAGNOSIS — E039 Hypothyroidism, unspecified: Secondary | ICD-10-CM | POA: Diagnosis not present

## 2017-07-10 DIAGNOSIS — I10 Essential (primary) hypertension: Secondary | ICD-10-CM | POA: Diagnosis not present

## 2017-07-10 DIAGNOSIS — H409 Unspecified glaucoma: Secondary | ICD-10-CM | POA: Diagnosis not present

## 2017-07-10 DIAGNOSIS — K219 Gastro-esophageal reflux disease without esophagitis: Secondary | ICD-10-CM | POA: Diagnosis not present

## 2017-07-10 DIAGNOSIS — G4700 Insomnia, unspecified: Secondary | ICD-10-CM | POA: Diagnosis not present

## 2017-07-10 DIAGNOSIS — K5900 Constipation, unspecified: Secondary | ICD-10-CM | POA: Diagnosis not present

## 2017-07-10 DIAGNOSIS — E119 Type 2 diabetes mellitus without complications: Secondary | ICD-10-CM | POA: Diagnosis not present

## 2017-07-10 DIAGNOSIS — N400 Enlarged prostate without lower urinary tract symptoms: Secondary | ICD-10-CM | POA: Diagnosis not present

## 2017-07-10 DIAGNOSIS — G20 Parkinson's disease: Secondary | ICD-10-CM | POA: Diagnosis not present

## 2017-07-10 DIAGNOSIS — E559 Vitamin D deficiency, unspecified: Secondary | ICD-10-CM | POA: Diagnosis not present

## 2017-07-10 DIAGNOSIS — E039 Hypothyroidism, unspecified: Secondary | ICD-10-CM | POA: Diagnosis not present

## 2017-07-10 DIAGNOSIS — F5101 Primary insomnia: Secondary | ICD-10-CM | POA: Diagnosis not present

## 2017-07-10 DIAGNOSIS — G3184 Mild cognitive impairment, so stated: Secondary | ICD-10-CM | POA: Diagnosis not present

## 2017-07-13 DIAGNOSIS — F4321 Adjustment disorder with depressed mood: Secondary | ICD-10-CM | POA: Diagnosis not present

## 2017-07-13 DIAGNOSIS — G4700 Insomnia, unspecified: Secondary | ICD-10-CM | POA: Diagnosis not present

## 2017-07-20 DIAGNOSIS — G894 Chronic pain syndrome: Secondary | ICD-10-CM | POA: Diagnosis not present

## 2017-07-26 DIAGNOSIS — Z79899 Other long term (current) drug therapy: Secondary | ICD-10-CM | POA: Diagnosis not present

## 2017-08-03 ENCOUNTER — Inpatient Hospital Stay (HOSPITAL_COMMUNITY)
Admission: EM | Admit: 2017-08-03 | Discharge: 2017-08-08 | DRG: 247 | Disposition: A | Discharge status: Skilled Nursing Facility | Payer: Medicare Other | Attending: Internal Medicine | Admitting: Internal Medicine

## 2017-08-03 ENCOUNTER — Emergency Department (HOSPITAL_COMMUNITY): Payer: Medicare Other

## 2017-08-03 ENCOUNTER — Encounter (HOSPITAL_COMMUNITY): Payer: Self-pay | Admitting: *Deleted

## 2017-08-03 ENCOUNTER — Other Ambulatory Visit: Payer: Self-pay

## 2017-08-03 DIAGNOSIS — N183 Chronic kidney disease, stage 3 unspecified: Secondary | ICD-10-CM | POA: Diagnosis present

## 2017-08-03 DIAGNOSIS — Z7982 Long term (current) use of aspirin: Secondary | ICD-10-CM

## 2017-08-03 DIAGNOSIS — Z7951 Long term (current) use of inhaled steroids: Secondary | ICD-10-CM

## 2017-08-03 DIAGNOSIS — F419 Anxiety disorder, unspecified: Secondary | ICD-10-CM | POA: Diagnosis present

## 2017-08-03 DIAGNOSIS — E039 Hypothyroidism, unspecified: Secondary | ICD-10-CM | POA: Diagnosis present

## 2017-08-03 DIAGNOSIS — Z79899 Other long term (current) drug therapy: Secondary | ICD-10-CM

## 2017-08-03 DIAGNOSIS — H9190 Unspecified hearing loss, unspecified ear: Secondary | ICD-10-CM | POA: Diagnosis present

## 2017-08-03 DIAGNOSIS — R079 Chest pain, unspecified: Secondary | ICD-10-CM | POA: Diagnosis not present

## 2017-08-03 DIAGNOSIS — Z955 Presence of coronary angioplasty implant and graft: Secondary | ICD-10-CM

## 2017-08-03 DIAGNOSIS — R7989 Other specified abnormal findings of blood chemistry: Secondary | ICD-10-CM | POA: Diagnosis not present

## 2017-08-03 DIAGNOSIS — R748 Abnormal levels of other serum enzymes: Secondary | ICD-10-CM | POA: Diagnosis not present

## 2017-08-03 DIAGNOSIS — I2510 Atherosclerotic heart disease of native coronary artery without angina pectoris: Secondary | ICD-10-CM | POA: Diagnosis present

## 2017-08-03 DIAGNOSIS — Z885 Allergy status to narcotic agent status: Secondary | ICD-10-CM

## 2017-08-03 DIAGNOSIS — H3530 Unspecified macular degeneration: Secondary | ICD-10-CM | POA: Diagnosis present

## 2017-08-03 DIAGNOSIS — I214 Non-ST elevation (NSTEMI) myocardial infarction: Principal | ICD-10-CM

## 2017-08-03 DIAGNOSIS — Z66 Do not resuscitate: Secondary | ICD-10-CM | POA: Diagnosis present

## 2017-08-03 DIAGNOSIS — I5032 Chronic diastolic (congestive) heart failure: Secondary | ICD-10-CM | POA: Diagnosis not present

## 2017-08-03 DIAGNOSIS — Z886 Allergy status to analgesic agent status: Secondary | ICD-10-CM

## 2017-08-03 DIAGNOSIS — K219 Gastro-esophageal reflux disease without esophagitis: Secondary | ICD-10-CM | POA: Diagnosis present

## 2017-08-03 DIAGNOSIS — I10 Essential (primary) hypertension: Secondary | ICD-10-CM | POA: Diagnosis not present

## 2017-08-03 DIAGNOSIS — R778 Other specified abnormalities of plasma proteins: Secondary | ICD-10-CM | POA: Diagnosis present

## 2017-08-03 DIAGNOSIS — Z91041 Radiographic dye allergy status: Secondary | ICD-10-CM

## 2017-08-03 DIAGNOSIS — Z85828 Personal history of other malignant neoplasm of skin: Secondary | ICD-10-CM

## 2017-08-03 DIAGNOSIS — Z974 Presence of external hearing-aid: Secondary | ICD-10-CM

## 2017-08-03 DIAGNOSIS — I499 Cardiac arrhythmia, unspecified: Secondary | ICD-10-CM | POA: Diagnosis not present

## 2017-08-03 DIAGNOSIS — E785 Hyperlipidemia, unspecified: Secondary | ICD-10-CM | POA: Diagnosis present

## 2017-08-03 DIAGNOSIS — G20 Parkinson's disease: Secondary | ICD-10-CM | POA: Diagnosis present

## 2017-08-03 DIAGNOSIS — E1129 Type 2 diabetes mellitus with other diabetic kidney complication: Secondary | ICD-10-CM | POA: Diagnosis present

## 2017-08-03 DIAGNOSIS — Z96653 Presence of artificial knee joint, bilateral: Secondary | ICD-10-CM | POA: Diagnosis present

## 2017-08-03 DIAGNOSIS — E782 Mixed hyperlipidemia: Secondary | ICD-10-CM | POA: Diagnosis not present

## 2017-08-03 DIAGNOSIS — Z8673 Personal history of transient ischemic attack (TIA), and cerebral infarction without residual deficits: Secondary | ICD-10-CM | POA: Diagnosis not present

## 2017-08-03 DIAGNOSIS — I130 Hypertensive heart and chronic kidney disease with heart failure and stage 1 through stage 4 chronic kidney disease, or unspecified chronic kidney disease: Secondary | ICD-10-CM | POA: Diagnosis present

## 2017-08-03 DIAGNOSIS — Z8546 Personal history of malignant neoplasm of prostate: Secondary | ICD-10-CM

## 2017-08-03 DIAGNOSIS — Z888 Allergy status to other drugs, medicaments and biological substances status: Secondary | ICD-10-CM

## 2017-08-03 DIAGNOSIS — I639 Cerebral infarction, unspecified: Secondary | ICD-10-CM | POA: Diagnosis not present

## 2017-08-03 DIAGNOSIS — E1122 Type 2 diabetes mellitus with diabetic chronic kidney disease: Secondary | ICD-10-CM | POA: Diagnosis present

## 2017-08-03 LAB — CBC
HCT: 39 % (ref 39.0–52.0)
HEMOGLOBIN: 12.3 g/dL — AB (ref 13.0–17.0)
MCH: 29 pg (ref 26.0–34.0)
MCHC: 31.5 g/dL (ref 30.0–36.0)
MCV: 92 fL (ref 78.0–100.0)
Platelets: 190 10*3/uL (ref 150–400)
RBC: 4.24 MIL/uL (ref 4.22–5.81)
RDW: 12.5 % (ref 11.5–15.5)
WBC: 5.5 10*3/uL (ref 4.0–10.5)

## 2017-08-03 LAB — I-STAT TROPONIN, ED
TROPONIN I, POC: 0.19 ng/mL — AB (ref 0.00–0.08)
TROPONIN I, POC: 0.16 ng/mL — AB (ref 0.00–0.08)

## 2017-08-03 LAB — BASIC METABOLIC PANEL
ANION GAP: 10 (ref 5–15)
BUN: 25 mg/dL — ABNORMAL HIGH (ref 8–23)
CO2: 27 mmol/L (ref 22–32)
Calcium: 9.2 mg/dL (ref 8.9–10.3)
Chloride: 103 mmol/L (ref 98–111)
Creatinine, Ser: 1.44 mg/dL — ABNORMAL HIGH (ref 0.61–1.24)
GFR, EST AFRICAN AMERICAN: 50 mL/min — AB (ref >60)
GFR, EST NON AFRICAN AMERICAN: 43 mL/min — AB (ref >60)
GLUCOSE: 108 mg/dL — AB (ref 70–99)
Potassium: 4.3 mmol/L (ref 3.5–5.1)
Sodium: 140 mmol/L (ref 135–145)

## 2017-08-03 LAB — TROPONIN I: Troponin I: 0.17 ng/mL (ref <0.03)

## 2017-08-03 MED ORDER — URELLE 81 MG PO TABS
1.0000 | ORAL_TABLET | Freq: Three times a day (TID) | ORAL | Status: DC
  Administered 2017-08-04 – 2017-08-06 (×6): 81 mg via ORAL
  Filled 2017-08-03 (×9): qty 1

## 2017-08-03 MED ORDER — CARBIDOPA-LEVODOPA 25-100 MG PO TABS
1.0000 | ORAL_TABLET | Freq: Three times a day (TID) | ORAL | Status: DC
  Administered 2017-08-04 – 2017-08-08 (×12): 1 via ORAL
  Filled 2017-08-03 (×13): qty 1

## 2017-08-03 MED ORDER — GUAIFENESIN ER 600 MG PO TB12: 600.0000 mg | ORAL_TABLET | Freq: Two times a day (BID) | ORAL | Status: DC | PRN

## 2017-08-03 MED ORDER — LEVOTHYROXINE SODIUM 100 MCG PO TABS
200.0000 ug | ORAL_TABLET | Freq: Every day | ORAL | Status: DC
  Administered 2017-08-04 – 2017-08-08 (×5): 200 ug via ORAL
  Filled 2017-08-03 (×5): qty 2

## 2017-08-03 MED ORDER — ATROPINE SULFATE 1 % OP SOLN
1.0000 [drp] | Freq: Three times a day (TID) | OPHTHALMIC | Status: DC
  Administered 2017-08-04 – 2017-08-08 (×13): 1 [drp] via OPHTHALMIC
  Filled 2017-08-03: qty 2

## 2017-08-03 MED ORDER — LATANOPROST 0.005 % OP SOLN
1.0000 [drp] | Freq: Every day | OPHTHALMIC | Status: DC
  Administered 2017-08-04 – 2017-08-07 (×5): 1 [drp] via OPHTHALMIC
  Filled 2017-08-03: qty 2.5

## 2017-08-03 MED ORDER — LORATADINE 10 MG PO TABS
10.0000 mg | ORAL_TABLET | Freq: Every day | ORAL | Status: DC
  Administered 2017-08-04 – 2017-08-08 (×5): 10 mg via ORAL
  Filled 2017-08-03 (×5): qty 1

## 2017-08-03 MED ORDER — INSULIN ASPART 100 UNIT/ML ~~LOC~~ SOLN
0.0000 [IU] | Freq: Every day | SUBCUTANEOUS | Status: DC
  Administered 2017-08-06: 2 [IU] via SUBCUTANEOUS

## 2017-08-03 MED ORDER — ASPIRIN EC 325 MG PO TBEC
325.0000 mg | DELAYED_RELEASE_TABLET | Freq: Every day | ORAL | Status: DC
  Administered 2017-08-04 – 2017-08-06 (×3): 325 mg via ORAL
  Filled 2017-08-03 (×3): qty 1

## 2017-08-03 MED ORDER — BRIMONIDINE TARTRATE-TIMOLOL 0.2-0.5 % OP SOLN: 1.0000 [drp] | Freq: Two times a day (BID) | OPHTHALMIC | Status: DC

## 2017-08-03 MED ORDER — ACETAMINOPHEN 325 MG PO TABS
650.0000 mg | ORAL_TABLET | ORAL | Status: DC | PRN
  Administered 2017-08-05: 650 mg via ORAL
  Filled 2017-08-03: qty 2

## 2017-08-03 MED ORDER — INSULIN ASPART 100 UNIT/ML ~~LOC~~ SOLN
0.0000 [IU] | Freq: Three times a day (TID) | SUBCUTANEOUS | Status: DC
  Administered 2017-08-06: 2 [IU] via SUBCUTANEOUS
  Administered 2017-08-07 (×2): 1 [IU] via SUBCUTANEOUS
  Administered 2017-08-07 – 2017-08-08 (×2): 2 [IU] via SUBCUTANEOUS

## 2017-08-03 MED ORDER — ZOLPIDEM TARTRATE 5 MG PO TABS: 5.0000 mg | ORAL_TABLET | Freq: Every evening | ORAL | Status: DC | PRN

## 2017-08-03 MED ORDER — PROSIGHT PO TABS
1.0000 | ORAL_TABLET | Freq: Every day | ORAL | Status: DC
  Administered 2017-08-04 – 2017-08-08 (×5): 1 via ORAL
  Filled 2017-08-03 (×5): qty 1

## 2017-08-03 MED ORDER — ALFUZOSIN HCL ER 10 MG PO TB24
10.0000 mg | ORAL_TABLET | Freq: Every day | ORAL | Status: DC
  Administered 2017-08-04 – 2017-08-07 (×4): 10 mg via ORAL
  Filled 2017-08-03 (×6): qty 1

## 2017-08-03 MED ORDER — DOCUSATE SODIUM 100 MG PO CAPS
100.0000 mg | ORAL_CAPSULE | Freq: Every day | ORAL | Status: DC
  Administered 2017-08-04 – 2017-08-07 (×4): 100 mg via ORAL
  Filled 2017-08-03 (×4): qty 1

## 2017-08-03 MED ORDER — PANTOPRAZOLE SODIUM 40 MG PO TBEC
40.0000 mg | DELAYED_RELEASE_TABLET | Freq: Every day | ORAL | Status: DC
  Administered 2017-08-04 – 2017-08-08 (×4): 40 mg via ORAL
  Filled 2017-08-03 (×4): qty 1

## 2017-08-03 MED ORDER — HYPROMELLOSE (GONIOSCOPIC) 2.5 % OP SOLN
1.0000 [drp] | Freq: Three times a day (TID) | OPHTHALMIC | Status: DC
  Administered 2017-08-04 – 2017-08-08 (×15): 1 [drp] via OPHTHALMIC
  Filled 2017-08-03: qty 15

## 2017-08-03 MED ORDER — VITAMIN D 1000 UNITS PO TABS
5000.0000 [IU] | ORAL_TABLET | Freq: Every day | ORAL | Status: DC
  Administered 2017-08-04 – 2017-08-08 (×4): 5000 [IU] via ORAL
  Filled 2017-08-03 (×4): qty 5

## 2017-08-03 MED ORDER — SODIUM CHLORIDE 0.9 % IV SOLN
INTRAVENOUS | Status: DC
  Administered 2017-08-04: 02:00:00 via INTRAVENOUS

## 2017-08-03 MED ORDER — ALBUTEROL SULFATE (2.5 MG/3ML) 0.083% IN NEBU
2.5000 mg | INHALATION_SOLUTION | RESPIRATORY_TRACT | Status: DC | PRN
Start: 2017-08-03 — End: 2017-08-08

## 2017-08-03 MED ORDER — DILTIAZEM HCL ER COATED BEADS 120 MG PO CP24
120.0000 mg | ORAL_CAPSULE | Freq: Every day | ORAL | Status: DC
  Administered 2017-08-04 – 2017-08-07 (×4): 120 mg via ORAL
  Filled 2017-08-03 (×4): qty 1

## 2017-08-03 MED ORDER — GABAPENTIN 100 MG PO CAPS
100.0000 mg | ORAL_CAPSULE | Freq: Every day | ORAL | Status: DC
  Administered 2017-08-04 – 2017-08-07 (×5): 100 mg via ORAL
  Filled 2017-08-03 (×5): qty 1

## 2017-08-03 MED ORDER — BRINZOLAMIDE 1 % OP SUSP
1.0000 [drp] | Freq: Three times a day (TID) | OPHTHALMIC | Status: DC
  Administered 2017-08-04 – 2017-08-08 (×13): 1 [drp] via OPHTHALMIC
  Filled 2017-08-03: qty 10

## 2017-08-03 MED ORDER — ONDANSETRON HCL 4 MG/2ML IJ SOLN: 4.0000 mg | Freq: Four times a day (QID) | INTRAMUSCULAR | Status: DC | PRN

## 2017-08-03 MED ORDER — HYDRALAZINE HCL 20 MG/ML IJ SOLN
5.0000 mg | INTRAMUSCULAR | Status: DC | PRN
  Administered 2017-08-05: 5 mg via INTRAVENOUS
  Filled 2017-08-03: qty 1

## 2017-08-03 MED ORDER — COLESEVELAM HCL 625 MG PO TABS
1875.0000 mg | ORAL_TABLET | Freq: Every day | ORAL | Status: DC
  Administered 2017-08-04 – 2017-08-08 (×4): 1875 mg via ORAL
  Filled 2017-08-03 (×6): qty 3

## 2017-08-03 MED ORDER — TRAMADOL HCL 50 MG PO TABS: 25.0000 mg | ORAL_TABLET | Freq: Four times a day (QID) | ORAL | Status: DC | PRN

## 2017-08-03 MED ORDER — POLYETHYLENE GLYCOL 3350 17 G PO PACK
17.0000 g | PACK | Freq: Every day | ORAL | Status: DC
  Administered 2017-08-04 – 2017-08-05 (×2): 17 g via ORAL
  Filled 2017-08-03 (×4): qty 1

## 2017-08-03 MED ORDER — ENOXAPARIN SODIUM 40 MG/0.4ML ~~LOC~~ SOLN
40.0000 mg | Freq: Every day | SUBCUTANEOUS | Status: DC
  Administered 2017-08-04 – 2017-08-08 (×5): 40 mg via SUBCUTANEOUS
  Filled 2017-08-03 (×5): qty 0.4

## 2017-08-03 NOTE — ED Notes (Signed)
Pt in xray. Xray will bring pt to C20.

## 2017-08-03 NOTE — ED Notes (Signed)
Date and time results received: 08/03/17 10:02 PM  Test: Troponin Critical Value: 0.17  Name of Provider Notified:  PA Mia McDonald

## 2017-08-03 NOTE — H&P (Signed)
History and Physical    [name] NAT:[id number] DOB: 1931-10-26 DOA: 08/03/2017  Referring MD/NP/PA:   PCP: Lajean Manes, MD   Patient coming from:  The patient is coming from SNF.  At baseline, pt is dependent for most of ADL.    Chief Complaint: Chest pain  HPI: [name] is a 84 y.o. male with medical history significant of of CVA, CKD, HTN, status dyslipidemia, DM type II, Prostate CA, and hypothyroidism who presents with chest pain.  Per report, pt had chest pain this morning at about 5 AM.  The chest pain was located in substernal area, moderate, nonradiating, lasted for about 30 minutes, then resolved spontaneously.  His CP was associated with lightheadedness, but no syncope.  No fall.  Patient denies any cough, shortness of breath.  No fever or chills. Patient denies any nausea, vomiting, diarrhea, abdominal pain, symptoms of UTI or unilateral weakness.  Currently patient is chest free, no shortness of breath in ED.  ED Course: pt was found to have troponin 0.17, WBC 5.5, stable renal function, temperature normal, no tachycardia, no tachypnea, negative chest x-ray.  Patient is placed on telemetry bed of observation.  Review of Systems:   General: no fevers, chills, no body weight gain, has fatigue HEENT: no blurry vision, hearing changes or sore throat Respiratory: no dyspnea, coughing, wheezing CV: has chest pain, no palpitations GI: no nausea, vomiting, abdominal pain, diarrhea, constipation GU: no dysuria, burning on urination, increased urinary frequency, hematuria  Ext: no leg edema Neuro: no unilateral weakness, numbness, or tingling, no vision change or hearing loss Skin: no rash, no skin tear. MSK: No muscle spasm, no deformity, no limitation of range of movement in spin Heme: No easy bruising.  Travel history: No recent long distant travel.  Allergy:  Allergies  Allergen Reactions  . Colchicine Anaphylaxis  . Hibiclens [Chlorhexidine] Hives  .  Atorvastatin Other (See Comments)    Reaction:  Joint pain   . Ceftin Diarrhea  . Celebrex [Celecoxib] Swelling  . Codeine Hives  . Contrast Media [Iodinated Diagnostic Agents] Nausea And Vomiting and Rash  . Erythromycin Diarrhea  . Ezetimibe Other (See Comments)    Reaction:  Joint pain   . Oxycodone-Acetaminophen Hives  . Rosuvastatin Other (See Comments)    Reaction:  Joint pain   . Simvastatin Other (See Comments)    Reaction:  Joint pain   . Cefpodoxime Rash  . Cefuroxime Axetil Rash  . Nitroglycerin Other (See Comments)    Reaction:  Lowers pts BP  . Tetanus Toxoid Rash  . Vantin Other (See Comments)    Reaction:  Unknown     Past Medical History:  Diagnosis Date  . Allergic rhinitis   . Arthritis    arthritis-kyphosis  . Asthmatic bronchitis   . Chronic kidney disease   . DM type 2 (diabetes mellitus, type 2) ([place])   . DVT, lower extremity, proximal, acute ([place])   . Dyslipidemia   . Dysrhythmia    '97 tachycardia- no recent issues  . GERD (gastroesophageal reflux disease)   . Hearing impaired    bilateral hearing aids  . History of kidney stones   . History of skin cancer   . Hypertension   . Hypothyroidism   . Macular degeneration    injections done bilaterally recent - 08-22-13  . Prostate cancer [facility])    Prostate cancer-radiation only,skin cancer-basal cell and last squamous cell right eye  . Stroke ([place])   . Urgency-frequency syndrome   .  Urticaria     Past Surgical History:  Procedure Laterality Date  . BACK SURGERY     lumbar fracture  . cataract surgery Bilateral   . COLONOSCOPY W/ POLYPECTOMY     x2   . CYSTOSCOPY WITH INJECTION N/A 09/11/2013   Procedure: CYSTOSCOPY WITH BOTOX INJECTION INTO THE BLADDER;  Surgeon: Ailene Rud, MD;  Location: [facility];  Service: Urology;  Laterality: N/A;  . CYSTOSCOPY WITH RETROGRADE PYELOGRAM, URETEROSCOPY AND STENT PLACEMENT    . CYSTOSCOPY WITH STENT PLACEMENT Right 01/26/2016   Procedure:  CYSTOSCOPY, RIGHT RETROGRADE WITH RIGHT URETERAL STENT PLACEMENT;  Surgeon: Cleon Gustin, MD;  Location: [facility];  Service: Urology;  Laterality: Right;  . CYSTOSCOPY/RETROGRADE/URETEROSCOPY Right 04/22/2016   Procedure: CYSTOSCOPY/RETROGRADE/ REMOVAL JJ STENT right insertion stent right insertion of foley;  Surgeon: Franchot Gallo, MD;  Location: [facility];  Service: Urology;  Laterality: Right;  . EXTRACORPOREAL SHOCK WAVE LITHOTRIPSY Right 02/27/2016   Procedure: RIGHT EXTRACORPOREAL SHOCK WAVE LITHOTRIPSY (ESWL) GAITED;  Surgeon: Cleon Gustin, MD;  Location: [facility];  Service: Urology;  Laterality: Right;  . EXTRACORPOREAL SHOCK WAVE LITHOTRIPSY Right 02/10/2016   Procedure: EXTRACORPOREAL SHOCK WAVE LITHOTRIPSY (ESWL);  Surgeon: Kathie Rhodes, MD;  Location: [facility];  Service: Urology;  Laterality: Right;  [phone] [id number] [employer]- [id number]   . EYE SURGERY     macular degeneration  . GALLBLADDER SURGERY  2006   no cholecystectomy  . HERNIA REPAIR    . LUNG REMOVAL, PARTIAL  age 57   for bronchiectasis  . PARTIAL THYMECTOMY  1985   for nodules  . POPLITEAL SYNOVIAL CYST EXCISION  2005  . TOTAL KNEE ARTHROPLASTY  2008   right  . TOTAL KNEE ARTHROPLASTY  2010   left    Social History:  reports that he has never smoked. He has never used smokeless tobacco. He reports that he does not drink alcohol or use drugs.  Family History:  Family History  Problem Relation Age of Onset  . Other Sister        ophth disease  . Heart attack Father        x7  . Heart disease Father        MI x7  . Hypertension Mother   . Stroke Mother 18  . Amblyopia Neg Hx   . Blindness Neg Hx   . Glaucoma Neg Hx   . Macular degeneration Neg Hx   . Retinal detachment Neg Hx   . Strabismus Neg Hx   . Retinitis pigmentosa Neg Hx   . Cataracts Neg Hx      Prior to Admission medications   Medication Sig Start Date End Date Taking? Authorizing Provider    albuterol (PROAIR HFA) 108 (90 Base) MCG/ACT inhaler Inhale 2 puffs into the lungs 3 (three) times daily as needed for wheezing or shortness of breath.   Yes [provider]  alfuzosin (UROXATRAL) 10 MG 24 hr tablet Take 1 tablet (10 mg total) by mouth at bedtime. 02/10/16  Yes Kathie Rhodes, MD  aspirin EC 81 MG tablet Take 81 mg by mouth daily.    Yes [provider]  atropine 1 % ophthalmic solution Place 1 drop into the right eye daily at 6pm for 14 days. 09/03/16  Yes [provider]  brimonidine-timolol (COMBIGAN) 0.2-0.5 % ophthalmic solution Place 1 drop into both eyes 2 times daily. 09/15/16  Yes [provider]  brinzolamide (AZOPT) 1 % ophthalmic suspension Place 1 drop into  the right eye 3 times daily. 09/03/16  Yes [provider]  carbidopa-levodopa (SINEMET IR) 25-100 MG tablet Take 1 tablet by mouth 3 (three) times daily.    Yes [provider]  Cholecalciferol (VITAMIN D-3) 5000 units TABS Take 5,000 Units by mouth daily.   Yes [provider]  colesevelam (WELCHOL) 625 MG tablet Take 1,875 mg by mouth daily.    Yes [provider]  diltiazem (CARDIZEM CD) 120 MG 24 hr capsule Take 120 mg by mouth daily.   Yes [provider]  docusate sodium (COLACE) 100 MG capsule Take 100 mg by mouth daily.   Yes [provider]  fexofenadine (ALLEGRA) 180 MG tablet Take 180 mg by mouth daily.   Yes [provider]  furosemide (LASIX) 20 MG tablet Take 20 mg by mouth daily.   Yes [provider]  gabapentin (NEURONTIN) 100 MG capsule Take 100 mg by mouth at bedtime.   Yes [provider]  guaiFENesin (MUCINEX) 600 MG 12 hr tablet Take by mouth 2 (two) times daily.   Yes [provider]  hydroxypropyl methylcellulose / hypromellose (ISOPTO TEARS / GONIOVISC) 2.5 % ophthalmic solution Place 1 drop into both eyes 4 (four) times daily.   Yes [provider]  latanoprost  (XALATAN) 0.005 % ophthalmic solution Place 1 drop into the right eye daily. 07/28/16  Yes [provider]  levothyroxine (SYNTHROID, LEVOTHROID) 200 MCG tablet Take 200 mcg by mouth daily.    Yes [provider]  Meth-Hyo-M Bl-Na Phos-Ph Sal (URIBEL) 118 MG CAPS Take 1 capsule (118 mg total) by mouth 3 (three) times daily as needed (burning). Patient taking differently: Take 1 capsule by mouth 3 (three) times daily.  01/27/16  Yes McKenzie, Candee Furbish, MD  Multiple Vitamins-Minerals (ICAPS) TABS Take 1 tablet by mouth daily.   Yes [provider]  omeprazole (PRILOSEC) 40 MG capsule Take 40 mg by mouth daily.   Yes [provider]  polyethylene glycol (MIRALAX / GLYCOLAX) packet Take 17 g by mouth daily.   Yes [provider]  potassium chloride (K-DUR,KLOR-CON) 10 MEQ tablet Take 10 mEq by mouth daily.   Yes [provider]  sitaGLIPtin (JANUVIA) 50 MG tablet Take 1 tablet (50 mg total) by mouth daily. 10/25/15  Yes Tat, Shanon Brow, MD  traMADol (ULTRAM) 50 MG tablet Take 1 tablet (50 mg total) by mouth every 6 (six) hours as needed. Patient taking differently: Take 25 mg by mouth 2 (two) times daily.  01/26/16  Yes McKenzie, Candee Furbish, MD  zolpidem (AMBIEN) 5 MG tablet Take 0.5 tablets (2.5 mg total) by mouth at bedtime as needed for sleep. Patient taking differently: Take 5 mg by mouth at bedtime as needed for sleep.  07/17/16  Yes Young, Tarri Fuller D, MD  mirabegron ER (MYRBETRIQ) 50 MG TB24 tablet Take 1 tablet (50 mg total) by mouth daily. Patient not taking: Reported on 08/03/2017 01/28/16   Cleon Gustin, MD    Physical Exam: Vitals:   08/03/17 2115 08/03/17 2145 08/03/17 2300 08/03/17 2315  BP: (!) 157/90 (!) 180/93 (!) 152/85 (!) 158/92  Pulse: 63 72    Resp: 14 19 17 17   Temp:      TempSrc:      SpO2: 98% 98%    Weight:      Height:       General: Not in acute distress HEENT:       Eyes: PERRL, EOMI, no scleral icterus.  ENT: No discharge from the ears and nose, no pharynx injection, no tonsillar enlargement.        Neck: No JVD, no bruit, no mass felt. Heme: No neck lymph node enlargement. Cardiac: S1/S2, RRR, No murmurs, No gallops or rubs. Respiratory: No rales, wheezing, rhonchi or rubs. GI: Soft, nondistended, nontender, no rebound pain, no organomegaly, BS present. GU: No hematuria Ext: No pitting leg edema bilaterally. 2+DP/PT pulse bilaterally. Musculoskeletal: No joint deformities, No joint redness or warmth, no limitation of ROM in spin. Skin: No rashes.  Neuro: Alert, oriented X place and person, but not to time, cranial nerves II-XII grossly intact, moves all extremities normally. Psych: Patient is not psychotic, no suicidal or hemocidal ideation.  Labs on Admission: I have personally reviewed following labs and imaging studies  CBC: Recent Labs  Lab 08/03/17 1654  WBC 5.5  HGB 12.3*  HCT 39.0  MCV 92.0  PLT 809   Basic Metabolic Panel: Recent Labs  Lab 08/03/17 1654  NA 140  K 4.3  CL 103  CO2 27  GLUCOSE 108*  BUN 25*  CREATININE 1.44*  CALCIUM 9.2   GFR: Estimated Creatinine Clearance: 39.9 mL/min (A) (by C-G formula based on SCr of 1.44 mg/dL (H)). Liver Function Tests: No results for input(s): AST, ALT, ALKPHOS, BILITOT, PROT, ALBUMIN in the last 168 hours. No results for input(s): LIPASE, AMYLASE in the last 168 hours. No results for input(s): AMMONIA in the last 168 hours. Coagulation Profile: No results for input(s): INR, PROTIME in the last 168 hours. Cardiac Enzymes: Recent Labs  Lab 08/03/17 2014 08/03/17 2326  TROPONINI 0.17* 0.18*   BNP (last 3 results) No results for input(s): PROBNP in the last 8760 hours. HbA1C: No results for input(s): HGBA1C in the last 72 hours. CBG: Recent Labs  Lab 08/04/17 0159  GLUCAP 159*   Lipid Profile: No results for input(s): CHOL, HDL, LDLCALC, TRIG, CHOLHDL, LDLDIRECT in the last 72 hours. Thyroid Function  Tests: No results for input(s): TSH, T4TOTAL, FREET4, T3FREE, THYROIDAB in the last 72 hours. Anemia Panel: No results for input(s): VITAMINB12, FOLATE, FERRITIN, TIBC, IRON, RETICCTPCT in the last 72 hours. Urine analysis:    Component Value Date/Time   COLORURINE BLUE (A) 04/24/2016 0021   APPEARANCEUR CLOUDY (A) 04/24/2016 0021   LABSPEC 1.016 04/24/2016 0021   PHURINE 5.0 04/24/2016 0021   GLUCOSEU NEGATIVE 04/24/2016 0021   HGBUR LARGE (A) 04/24/2016 0021   BILIRUBINUR NEGATIVE 04/24/2016 0021   KETONESUR NEGATIVE 04/24/2016 0021   PROTEINUR 100 (A) 04/24/2016 0021   UROBILINOGEN 0.2 05/26/2014 2040   NITRITE NEGATIVE 04/24/2016 0021   LEUKOCYTESUR LARGE (A) 04/24/2016 0021   Sepsis Labs: @LABRCNTIP (procalcitonin:4,lacticidven:4) ) Recent Results (from the past 240 hour(s))  MRSA PCR Screening     Status: None   Collection Time: 08/04/17  1:00 AM  Result Value Ref Range Status   MRSA by PCR NEGATIVE NEGATIVE Final    Comment:        The GeneXpert MRSA Assay (FDA approved for NASAL specimens only), is one component of a comprehensive MRSA colonization surveillance program. It is not intended to diagnose MRSA infection nor to guide or monitor treatment for MRSA infections. Performed at [facility], [place] [name] [place], [place] [address]      Radiological Exams on Admission: Dg Chest 2 View  Result Date: 08/03/2017 CLINICAL DATA:  Chest pain EXAM: CHEST - 2 VIEW COMPARISON:  01/25/2016 FINDINGS: Heart size and vascularity normal. Probable COPD. Negative  for infiltrate effusion or mass. Numerous small calcifications in the left lung base are chronic and unchanged. IMPRESSION: No active cardiopulmonary disease. Electronically Signed   By: Franchot Gallo M.D.   On: 08/03/2017 18:33     EKG: Independently reviewed.  Not done in ED, will get one.   Assessment/Plan Principal Problem:   Chest pain Active Problems:   Hypothyroidism (post surgical)    HYPERLIPIDEMIA   CVA (cerebral vascular accident) ([place])   Benign essential HTN   Elevated troponin   CKD (chronic kidney disease), stage III (H[facility])   Type II diabetes mellitus with renal manifestations (H[facility])   GERD (gastroesophageal reflux disease)   Chest pain and elevated trop: trop 0.17-->0.18.  Since patient chest pain has completely resolved, currently no chest pain or shortness of breath.  Will hold off IV heparin.  - will place on Tele bed for obs - cycle CE q6 x3 and repeat EKG in the am  - prn Morphine, and aspirin - pt is allergic to NTG - Risk factor stratification: will check FLP and A1C  - 2d echo - inpt card consult was requested via Epic  Hypothyroidism (post surgical): Last TSH was 3.027 on 10/24/2015 -Continue home Synthroid  HYPERLIPIDEMIA: -pt is allergic to statin -on Welchol  Hx of CVA (cerebral vascular accident) ([place]): -ASA and welchol  HTN:  -Continue home medications: Cardizem - pt is also on alfuzosin - hold lasix while on NPO - IV hydralazine prn  CKD (chronic kidney disease), stage III ([place]): stable. baseline creatinine 1.4-1.5.  His creatinine is 1.44, BUN 25 -Follow-up renal function by BMP  Type II diabetes mellitus with renal manifestations ([place]): Last A1c 6/4 on 10/24/15, well controled. Patient is taking Januvia at home -SSI  GERD: -Protonix   DVT ppx: SQ Lovenox Code Status: DNR (pt has DNR document from SNF) Family Communication: None at bed side.   Disposition Plan:  Anticipate discharge back to previous SNF Consults called:  none Admission status: Obs / tele      Date of Service 08/04/2017    Ivor Costa [facility] Pager [phone]  If 7PM-7AM, please contact night-coverage [email or web address] Password [email or web address] 08/04/2017, 2:40 AM

## 2017-08-03 NOTE — ED Provider Notes (Signed)
Patient placed in Quick Look pathway, seen and evaluated   Chief Complaint: chest pain   HPI:   Pt reports he had chest pain earlier today  ROS: no fever, no cough  Physical Exam:   Gen: No distress  Neuro: Awake and Alert  Skin: Warm    Focused Exam: Lung clear, heart rrr   Initiation of care has begun. The patient has been counseled on the process, plan, and necessity for staying for the completion/evaluation, and the remainder of the medical screening examination   Sidney Ace 08/03/17 [place], Gwenyth Allegra, MD 08/04/17 [phone]

## 2017-08-03 NOTE — ED Notes (Signed)
Patient given [place] sandwich and drink.   Denies any chest pain at this time.

## 2017-08-03 NOTE — ED Triage Notes (Signed)
Pt arrived by [facility] from [facility]. Pt had reported episode of chest pain this am. Has been pain free since the episode. They did an ekg and sent him here due to abnormal ekg.

## 2017-08-03 NOTE — ED Provider Notes (Signed)
[facility] EMERGENCY DEPARTMENT Provider Note   CSN: [id number] Arrival date & time: 08/03/17  1638     History   Chief Complaint Chief Complaint  Patient presents with  . Chest Pain    HPI [name] is a 84 y.o. male with a history of CVA, CKD, HTN, status dyslipidemia, and 70 DM type II, Prostate CA, and hypothyroidism who presents to the emergency department from [place] by EMS with a chief complaint of chest pain.  The patient endorses sudden onset substernal chest pain that lasted for approximately 30 minutes that began while he was getting ready for breakfast this morning.  He reports associated severe all-over headache and states the pain went from his chest straight up to his head.  He states he felt very lightheaded and was afraid he might pass out and wouldn't make it back to his bed.  Patient states someone from the nursing home gave him "some medicine", and his symptoms resolved.  He does not know the name of the medication or who gave it to him.  He denied nausea, neck pain, jaw pain, arm pain, vomiting, back pain, visual changes, numbness, cough, dyspnea, leg swelling, palpitations, or weakness.  Spoke with Calla Kicks supervisor at [facility], did not directly provide care for the patient, but states that nursing notes from this AM said he was "hurting" in chest and felt like he might pass out around 5 AM. Looked pale in face. Pulse 68. BP 126/96 at 8 AM. An EKG was ordered, but was not performed until 15:07. EKG Results sent with paperwork.  Stanton Kidney states that the EKG contains some abnormalities and look like the patient may have recently had a heart attack so he was sent to the ED for further evaluation.  Stanton Kidney reports that any medications that the patient would have been given by staff should be on the patient's [facility] that were sent with him.  The patient reports that he has had no further episodes of chest pain or lightheadedness  this morning.  After his symptoms resolved, he was able to complete his daily workout in the gym, which includes riding an exercise bike and lifting weights.  He is a DNR.  The history is provided by the nursing home, the patient and a relative. No language interpreter was used.    Past Medical History:  Diagnosis Date  . Allergic rhinitis   . Arthritis    arthritis-kyphosis  . Asthmatic bronchitis   . Chronic kidney disease   . DM type 2 (diabetes mellitus, type 2) ([place])   . DVT, lower extremity, proximal, acute ([place])   . Dyslipidemia   . Dysrhythmia    '97 tachycardia- no recent issues  . GERD (gastroesophageal reflux disease)   . Hearing impaired    bilateral hearing aids  . History of kidney stones   . History of skin cancer   . Hypertension   . Hypothyroidism   . Macular degeneration    injections done bilaterally recent - 08-22-13  . Prostate cancer [facility])    Prostate cancer-radiation only,skin cancer-basal cell and last squamous cell right eye  . Stroke ([place])   . Urgency-frequency syndrome   . Urticaria     Patient Active Problem List   Diagnosis Date Noted  . Elevated troponin 08/03/2017  . Chest pain 08/03/2017  . CKD (chronic kidney disease), stage III ([place]) 08/03/2017  . Type II diabetes mellitus with renal manifestations ([place]) 08/03/2017  .  Pseudophakia of both eyes 05/31/2017  . Retained ureteral stent 04/22/2016  . Ureteral calculus 01/25/2016  . Mental status change 10/24/2015  . Encephalopathy 10/24/2015  . Hypothermia 10/24/2015  . Chronic renal insufficiency 10/24/2015  . Benign essential HTN 10/24/2015  . Hypoglycemia 10/24/2015  . Acute encephalopathy 10/24/2015  . CVA (cerebral vascular accident) ([place]) 10/14/2014  . Prerenal azotemia 10/14/2014  . Mild HTN 10/14/2014  . CVA (cerebral infarction)   . TIA (transient ischemic attack)   . Diabetes mellitus type 2 without retinopathy ([place]) 03/27/2014  . CME (cystoid macular edema), left  08/10/2013  . PNAR (perennial non-allergic rhinitis) 05/23/2013  . Squamous cell carcinoma of eyelid 05/15/2013  . Squamous cell carcinoma 04/06/2013  . Blepharitis 03/16/2013  . Eyelid lesion 03/16/2013  . Seasonal and perennial allergic rhinitis 06/11/2012  . Posterior vitreous detachment of right eye 10/23/2011  . Exudative age-related macular degeneration ([place]) 07/28/2011  . Nonproliferative diabetic retinopathy ([place]) 04/07/2011  . Acute bronchitis due to infection 01/14/2011  . Nondiabetic proliferative retinopathy 10/17/2010  . Insomnia, unspecified 05/19/2010  . ATAXIA 03/17/2010  . DIZZINESS 03/06/2010  . Chronic constipation 02/21/2008  . OSTEOARTHRITIS, KNEE, SEVERE 01/12/2008  . HYPERLIPIDEMIA 01/17/2007  . Hypothyroidism (post surgical) 10/15/2006  . SKIN CANCER, HX OF 06/08/2006  . Diabetes mellitus, insulin dependent (IDDM), controlled ([place]) 04/22/2006  . URTICARIA 04/22/2006  . POPLITEAL CYST 04/22/2006  . THYROIDECTOMY, SUBTOTAL, HX OF 04/22/2006  . TOTAL KNEE REPLACEMENT, HX OF 04/22/2006    Past Surgical History:  Procedure Laterality Date  . BACK SURGERY     lumbar fracture  . cataract surgery Bilateral   . COLONOSCOPY W/ POLYPECTOMY     x2   . CYSTOSCOPY WITH INJECTION N/A 09/11/2013   Procedure: CYSTOSCOPY WITH BOTOX INJECTION INTO THE BLADDER;  Surgeon: Ailene Rud, MD;  Location: [facility];  Service: Urology;  Laterality: N/A;  . CYSTOSCOPY WITH RETROGRADE PYELOGRAM, URETEROSCOPY AND STENT PLACEMENT    . CYSTOSCOPY WITH STENT PLACEMENT Right 01/26/2016   Procedure: CYSTOSCOPY, RIGHT RETROGRADE WITH RIGHT URETERAL STENT PLACEMENT;  Surgeon: Cleon Gustin, MD;  Location: [facility];  Service: Urology;  Laterality: Right;  . CYSTOSCOPY/RETROGRADE/URETEROSCOPY Right 04/22/2016   Procedure: CYSTOSCOPY/RETROGRADE/ REMOVAL JJ STENT right insertion stent right insertion of foley;  Surgeon: Franchot Gallo, MD;  Location: [facility];  Service: Urology;   Laterality: Right;  . EXTRACORPOREAL SHOCK WAVE LITHOTRIPSY Right 02/27/2016   Procedure: RIGHT EXTRACORPOREAL SHOCK WAVE LITHOTRIPSY (ESWL) GAITED;  Surgeon: Cleon Gustin, MD;  Location: [facility];  Service: Urology;  Laterality: Right;  . EXTRACORPOREAL SHOCK WAVE LITHOTRIPSY Right 02/10/2016   Procedure: EXTRACORPOREAL SHOCK WAVE LITHOTRIPSY (ESWL);  Surgeon: Kathie Rhodes, MD;  Location: [facility];  Service: Urology;  Laterality: Right;  [phone] [id number] [employer]- [id number]   . EYE SURGERY     macular degeneration  . GALLBLADDER SURGERY  2006   no cholecystectomy  . HERNIA REPAIR    . LUNG REMOVAL, PARTIAL  age 74   for bronchiectasis  . PARTIAL THYMECTOMY  1985   for nodules  . POPLITEAL SYNOVIAL CYST EXCISION  2005  . TOTAL KNEE ARTHROPLASTY  2008   right  . TOTAL KNEE ARTHROPLASTY  2010   left        Home Medications    Prior to Admission medications   Medication Sig Start Date End Date Taking? Authorizing Provider  albuterol (PROAIR HFA) 108 (90 Base) MCG/ACT inhaler Inhale 2 puffs into the lungs 3 (three) times daily as  needed for wheezing or shortness of breath.   Yes [provider]  alfuzosin (UROXATRAL) 10 MG 24 hr tablet Take 1 tablet (10 mg total) by mouth at bedtime. 02/10/16  Yes Kathie Rhodes, MD  aspirin EC 81 MG tablet Take 81 mg by mouth daily.    Yes [provider]  atropine 1 % ophthalmic solution Place 1 drop into the right eye daily at 6pm for 14 days. 09/03/16  Yes [provider]  brimonidine-timolol (COMBIGAN) 0.2-0.5 % ophthalmic solution Place 1 drop into both eyes 2 times daily. 09/15/16  Yes [provider]  brinzolamide (AZOPT) 1 % ophthalmic suspension Place 1 drop into the right eye 3 times daily. 09/03/16  Yes [provider]  carbidopa-levodopa (SINEMET IR) 25-100 MG tablet Take 1 tablet by mouth 3 (three) times daily.    Yes [provider]  Cholecalciferol  (VITAMIN D-3) 5000 units TABS Take 5,000 Units by mouth daily.   Yes [provider]  colesevelam (WELCHOL) 625 MG tablet Take 1,875 mg by mouth daily.    Yes [provider]  diltiazem (CARDIZEM CD) 120 MG 24 hr capsule Take 120 mg by mouth daily.   Yes [provider]  docusate sodium (COLACE) 100 MG capsule Take 100 mg by mouth daily.   Yes [provider]  fexofenadine (ALLEGRA) 180 MG tablet Take 180 mg by mouth daily.   Yes [provider]  furosemide (LASIX) 20 MG tablet Take 20 mg by mouth daily.   Yes [provider]  gabapentin (NEURONTIN) 100 MG capsule Take 100 mg by mouth at bedtime.   Yes [provider]  guaiFENesin (MUCINEX) 600 MG 12 hr tablet Take by mouth 2 (two) times daily.   Yes [provider]  hydroxypropyl methylcellulose / hypromellose (ISOPTO TEARS / GONIOVISC) 2.5 % ophthalmic solution Place 1 drop into both eyes 4 (four) times daily.   Yes [provider]  latanoprost (XALATAN) 0.005 % ophthalmic solution Place 1 drop into the right eye daily. 07/28/16  Yes [provider]  levothyroxine (SYNTHROID, LEVOTHROID) 200 MCG tablet Take 200 mcg by mouth daily.    Yes [provider]  Meth-Hyo-M Bl-Na Phos-Ph Sal (URIBEL) 118 MG CAPS Take 1 capsule (118 mg total) by mouth 3 (three) times daily as needed (burning). Patient taking differently: Take 1 capsule by mouth 3 (three) times daily.  01/27/16  Yes McKenzie, Candee Furbish, MD  Multiple Vitamins-Minerals (ICAPS) TABS Take 1 tablet by mouth daily.   Yes [provider]  omeprazole (PRILOSEC) 40 MG capsule Take 40 mg by mouth daily.   Yes [provider]  polyethylene glycol (MIRALAX / GLYCOLAX) packet Take 17 g by mouth daily.   Yes [provider]  potassium chloride (K-DUR,KLOR-CON) 10 MEQ tablet Take 10 mEq by mouth daily.   Yes [provider]  sitaGLIPtin (JANUVIA) 50 MG tablet Take 1 tablet  (50 mg total) by mouth daily. 10/25/15  Yes Tat, Shanon Brow, MD  traMADol (ULTRAM) 50 MG tablet Take 1 tablet (50 mg total) by mouth every 6 (six) hours as needed. Patient taking differently: Take 25 mg by mouth 2 (two) times daily.  01/26/16  Yes McKenzie, Candee Furbish, MD  zolpidem (AMBIEN) 5 MG tablet Take 0.5 tablets (2.5 mg total) by mouth at bedtime as needed for sleep. Patient taking differently: Take 5 mg by mouth at bedtime as needed for sleep.  07/17/16  Yes Deneise Lever, MD  mirabegron [facility]) 50  MG TB24 tablet Take 1 tablet (50 mg total) by mouth daily. Patient not taking: Reported on 08/03/2017 01/28/16   Cleon Gustin, MD    Family History Family History  Problem Relation Age of Onset  . Other Sister        ophth disease  . Heart attack Father        x7  . Heart disease Father        MI x7  . Hypertension Mother   . Stroke Mother 37  . Amblyopia Neg Hx   . Blindness Neg Hx   . Glaucoma Neg Hx   . Macular degeneration Neg Hx   . Retinal detachment Neg Hx   . Strabismus Neg Hx   . Retinitis pigmentosa Neg Hx   . Cataracts Neg Hx     Social History Social History   Tobacco Use  . Smoking status: Never Smoker  . Smokeless tobacco: Never Used  Substance Use Topics  . Alcohol use: No  . Drug use: No     Allergies   Colchicine; Hibiclens [chlorhexidine]; Atorvastatin; Ceftin; Celebrex [celecoxib]; Codeine; Contrast media [iodinated diagnostic agents]; Erythromycin; Ezetimibe; Oxycodone-acetaminophen; Rosuvastatin; Simvastatin; Cefpodoxime; Cefuroxime axetil; Nitroglycerin; Tetanus toxoid; and Vantin   Review of Systems Review of Systems  Constitutional: Negative for appetite change, chills and fever.  HENT: Negative for congestion.   Eyes: Negative for visual disturbance.  Respiratory: Negative for cough and shortness of breath.   Cardiovascular: Positive for chest pain. Negative for palpitations and leg swelling.  Gastrointestinal: Negative for  abdominal pain, nausea and vomiting.  Genitourinary: Negative for dysuria.  Musculoskeletal: Negative for back pain.  Skin: Negative for rash.  Allergic/Immunologic: Negative for immunocompromised state.  Neurological: Positive for light-headedness. Negative for dizziness, syncope, weakness and headaches.  Psychiatric/Behavioral: Negative for confusion.   Physical Exam Updated Vital Signs BP (!) 158/92   Pulse 72   Temp 97.8 F (36.6 C) (Oral)   Resp 17   Ht 5\' 11"  (1.803 m)   Wt 72.6 kg (160 lb)   SpO2 98%   BMI 22.32 kg/m   Physical Exam  Constitutional: He appears well-developed.  HENT:  Head: Normocephalic.  Eyes: Pupils are equal, round, and reactive to light. Conjunctivae and EOM are normal. No scleral icterus.  Neck: Normal range of motion. Neck supple. No JVD present.  Cardiovascular: Normal rate, regular rhythm, normal heart sounds and intact distal pulses. Exam reveals no gallop and no friction rub.  No murmur heard. Radial, DP, PT pulses are 2+ and symmetric.  Pulmonary/Chest: Effort normal and breath sounds normal. No stridor. No respiratory distress. He has no wheezes. He has no rales. He exhibits no tenderness.  Abdominal: Soft. Bowel sounds are normal. He exhibits no distension and no mass. There is no tenderness. There is no rebound and no guarding. No hernia.  Musculoskeletal: Normal range of motion. He exhibits no edema, tenderness or deformity.  Neurological: He is alert.  Cranial nerves II through XII are grossly intact.  GCS 15.  5 out of 5 strength against resistance of the bilateral upper and lower extremities.  Sensation is intact and equal.   Skin: Skin is warm and dry.  Psychiatric: His behavior is normal.  Nursing note and vitals reviewed.  ED Treatments / Results  Labs (all labs ordered are listed, but only abnormal results are displayed) Labs Reviewed  BASIC METABOLIC PANEL - Abnormal; Notable for the following components:      Result Value    Glucose, Bld  108 (*)    BUN 25 (*)    Creatinine, Ser 1.44 (*)    GFR calc non Af Amer 43 (*)    GFR calc Af Amer 50 (*)    All other components within normal limits  CBC - Abnormal; Notable for the following components:   Hemoglobin 12.3 (*)    All other components within normal limits  TROPONIN I - Abnormal; Notable for the following components:   Troponin I 0.17 (*)    All other components within normal limits  TROPONIN I - Abnormal; Notable for the following components:   Troponin I 0.18 (*)    All other components within normal limits  I-STAT TROPONIN, ED - Abnormal; Notable for the following components:   Troponin i, poc 0.19 (*)    All other components within normal limits  I-STAT TROPONIN, ED - Abnormal; Notable for the following components:   Troponin i, poc 0.16 (*)    All other components within normal limits  MRSA PCR SCREENING  HEMOGLOBIN A1C  LIPID PANEL  TROPONIN I  TROPONIN I  BRAIN NATRIURETIC PEPTIDE    EKG EKG Interpretation  Date/Time:  Tuesday August 03 2017 16:48:50 EDT Ventricular Rate:  74 PR Interval:  244 QRS Duration: 84 QT Interval:  394 QTC Calculation: 437 R Axis:   74 Text Interpretation:  Sinus rhythm with sinus arrhythmia with 1st degree A-V block with frequent Premature ventricular complexes Otherwise normal ECG No acute changes pvc No acute changes Confirmed by Varney Biles ([id number]) on 08/03/2017 7:02:10 PM   Radiology Dg Chest 2 View  Result Date: 08/03/2017 CLINICAL DATA:  Chest pain EXAM: CHEST - 2 VIEW COMPARISON:  01/25/2016 FINDINGS: Heart size and vascularity normal. Probable COPD. Negative for infiltrate effusion or mass. Numerous small calcifications in the left lung base are chronic and unchanged. IMPRESSION: No active cardiopulmonary disease. Electronically Signed   By: Franchot Gallo M.D.   On: 08/03/2017 18:33    Procedures Procedures (including critical care time)  Medications Ordered in ED Medications  alfuzosin  (UROXATRAL) 24 hr tablet 10 mg (has no administration in time range)  aspirin EC tablet 325 mg (has no administration in time range)  atropine 1 % ophthalmic solution 1 drop (1 drop Right Eye Given 08/04/17 0146)  brinzolamide (AZOPT) 1 % ophthalmic suspension 1 drop (1 drop Right Eye Given 08/04/17 0145)  carbidopa-levodopa (SINEMET IR) 25-100 MG per tablet immediate release 1 tablet (has no administration in time range)  cholecalciferol (VITAMIN D) tablet 5,000 Units (has no administration in time range)  colesevelam [facility]) tablet 1,875 mg (has no administration in time range)  diltiazem (CARDIZEM CD) 24 hr capsule 120 mg (has no administration in time range)  docusate sodium (COLACE) capsule 100 mg (has no administration in time range)  loratadine (CLARITIN) tablet 10 mg (has no administration in time range)  gabapentin (NEURONTIN) capsule 100 mg (100 mg Oral Given 08/04/17 0144)  guaiFENesin (MUCINEX) 12 hr tablet 600 mg (has no administration in time range)  hydroxypropyl methylcellulose / hypromellose (ISOPTO TEARS / GONIOVISC) 2.5 % ophthalmic solution 1 drop (1 drop Both Eyes Given 08/04/17 0147)  latanoprost (XALATAN) 0.005 % ophthalmic solution 1 drop (has no administration in time range)  levothyroxine (SYNTHROID, LEVOTHROID) tablet 200 mcg (has no administration in time range)  URELLE (URELLE/URISED) 81 MG tablet 81 mg (has no administration in time range)  multivitamin (PROSIGHT) tablet 1 tablet (has no administration in time range)  pantoprazole (PROTONIX) EC tablet 40 mg (has  no administration in time range)  polyethylene glycol (MIRALAX / GLYCOLAX) packet 17 g (has no administration in time range)  traMADol (ULTRAM) tablet 25 mg (has no administration in time range)  zolpidem (AMBIEN) tablet 5 mg (has no administration in time range)  albuterol (PROVENTIL) (2.5 MG/3ML) 0.083% nebulizer solution 2.5 mg (has no administration in time range)  acetaminophen (TYLENOL) tablet 650 mg  (has no administration in time range)  ondansetron (ZOFRAN) injection 4 mg (has no administration in time range)  enoxaparin (LOVENOX) injection 40 mg (has no administration in time range)  hydrALAZINE (APRESOLINE) injection 5 mg (has no administration in time range)  0.9 %  sodium chloride infusion ( Intravenous New Bag/Given 08/04/17 0143)  insulin aspart (novoLOG) injection 0-9 Units (has no administration in time range)  insulin aspart (novoLOG) injection 0-5 Units (has no administration in time range)  brimonidine (ALPHAGAN) 0.2 % ophthalmic solution 1 drop (1 drop Both Eyes Given 08/04/17 0146)    And  timolol (TIMOPTIC) 0.5 % ophthalmic solution 1 drop (1 drop Both Eyes Given 08/04/17 0147)     Initial Impression / Assessment and Plan / ED Course  I have reviewed the triage vital signs and the nursing notes.  Pertinent labs & imaging results that were available during my care of the patient were reviewed by me and considered in my medical decision making (see chart for details).     84 year old male with a history of CVA, CKD, HTN, status dyslipidemia, and 70 DM type II, Prostate CA, and hypothyroidism who presents by EMS from [place] with chest pain.  He reports a 30-minute episode of chest pain while he was  getting ready for breakfast there are some pulse in the patient's story as the patient states that he was giving medication from a staff member, but no medication is on the [facility] that came with him from [place].  It appears that the EKG was ordered after the patient began endorsing chest pain around 5 AM, but was not performed until 3 PM, many hours after the patient's chest pain had resolved.  He reports that he was able to go to the gym and use an exercise bike and lift weights without any additional episodes of chest pain or lightheadedness.  EKG with sinus rhythm, PVCs, and first-degree AV block.  Initial troponin is elevated at 0.19.  The patient was seen and evaluated  along with Dr. Kathrynn Humble, attending physician.  Repeat i-STAT troponin at 0.16.  Initial troponin I 0.17.  Low suspicion for ischemic etiology at this time.  The patient has no history of CAD. Patient continues to be chest pain-free.  He is requesting something to eat.  Spoke with Dr.Niu, hospitalist, who will accept the admission to monitor the patient's serial troponins.  He will plan to consult cardiology in the morning. The patient appears reasonably stabilized for admission considering the current resources, flow, and capabilities available in the ED at this time, and I doubt any other [facility] requiring further screening and/or treatment in the ED prior to admission.  Final Clinical Impressions(s) / ED Diagnoses   Final diagnoses:  Chest pain, unspecified type  Elevated troponin    ED Discharge Orders    None       Joanne Gavel, PA-C 08/04/17 0149    Varney Biles, MD 08/04/17 [phone]

## 2017-08-03 NOTE — ED Notes (Signed)
Informed first nurse Judson Roch of I-stat troponin 0.19

## 2017-08-04 ENCOUNTER — Observation Stay (HOSPITAL_BASED_OUTPATIENT_CLINIC_OR_DEPARTMENT_OTHER): Payer: Medicare Other

## 2017-08-04 DIAGNOSIS — E782 Mixed hyperlipidemia: Secondary | ICD-10-CM

## 2017-08-04 DIAGNOSIS — R079 Chest pain, unspecified: Secondary | ICD-10-CM

## 2017-08-04 DIAGNOSIS — I10 Essential (primary) hypertension: Secondary | ICD-10-CM

## 2017-08-04 DIAGNOSIS — K219 Gastro-esophageal reflux disease without esophagitis: Secondary | ICD-10-CM | POA: Diagnosis not present

## 2017-08-04 DIAGNOSIS — N183 Chronic kidney disease, stage 3 (moderate): Secondary | ICD-10-CM | POA: Diagnosis not present

## 2017-08-04 DIAGNOSIS — E1122 Type 2 diabetes mellitus with diabetic chronic kidney disease: Secondary | ICD-10-CM

## 2017-08-04 DIAGNOSIS — R748 Abnormal levels of other serum enzymes: Secondary | ICD-10-CM

## 2017-08-04 LAB — GLUCOSE, CAPILLARY
GLUCOSE-CAPILLARY: 159 mg/dL — AB (ref 70–99)
GLUCOSE-CAPILLARY: 81 mg/dL (ref 70–99)
Glucose-Capillary: 110 mg/dL — ABNORMAL HIGH (ref 70–99)
Glucose-Capillary: 105 mg/dL — ABNORMAL HIGH (ref 70–99)
Glucose-Capillary: 117 mg/dL — ABNORMAL HIGH (ref 70–99)

## 2017-08-04 LAB — LIPID PANEL
CHOLESTEROL: 157 mg/dL (ref 0–200)
HDL: 40 mg/dL — ABNORMAL LOW (ref >40)
LDL Cholesterol: 106 mg/dL — ABNORMAL HIGH (ref 0–99)
Total CHOL/HDL Ratio: 3.9 RATIO
Triglycerides: 57 mg/dL (ref <150)
VLDL: 11 mg/dL (ref 0–40)

## 2017-08-04 LAB — MRSA PCR SCREENING: MRSA by PCR: NEGATIVE

## 2017-08-04 LAB — TROPONIN I
TROPONIN I: 0.16 ng/mL — AB (ref <0.03)
TROPONIN I: 0.11 ng/mL — AB (ref <0.03)
Troponin I: 0.18 ng/mL (ref <0.03)
Troponin I: 0.12 ng/mL (ref <0.03)

## 2017-08-04 LAB — ECHOCARDIOGRAM COMPLETE
HEIGHTINCHES: 71 in
Weight: 2574.4 oz

## 2017-08-04 LAB — BRAIN NATRIURETIC PEPTIDE: B NATRIURETIC PEPTIDE 5: 431 pg/mL — AB (ref 0.0–100.0)

## 2017-08-04 LAB — HEMOGLOBIN A1C
Hgb A1c MFr Bld: 6.3 % — ABNORMAL HIGH (ref 4.8–5.6)
Mean Plasma Glucose: 134.11 mg/dL

## 2017-08-04 MED ORDER — BRIMONIDINE TARTRATE 0.2 % OP SOLN
1.0000 [drp] | Freq: Two times a day (BID) | OPHTHALMIC | Status: DC
  Administered 2017-08-04 – 2017-08-08 (×10): 1 [drp] via OPHTHALMIC
  Filled 2017-08-04: qty 5

## 2017-08-04 MED ORDER — TIMOLOL MALEATE 0.5 % OP SOLN
1.0000 [drp] | Freq: Two times a day (BID) | OPHTHALMIC | Status: DC
  Administered 2017-08-04 – 2017-08-08 (×10): 1 [drp] via OPHTHALMIC
  Filled 2017-08-04: qty 5

## 2017-08-04 NOTE — Plan of Care (Signed)
Patent has ambulated to the bathroom x 2, tolerated diet well. Will continue to monitor

## 2017-08-04 NOTE — Progress Notes (Signed)
  Echocardiogram 2D Echocardiogram has been performed.  [name] 08/04/2017, 2:47 PM

## 2017-08-04 NOTE — Consult Note (Addendum)
Cardiology Consultation:   Patient ID: [name]; [id number]; December 22, 1931   Admit date: 08/03/2017 Date of Consult: 08/04/2017  Primary Care Provider: Lajean Manes, MD Primary Cardiologist: [name]   Patient Profile:   [name] is a 84 y.o. male with no prior cardiac history but multiple cardiac risk factors including type 2DM, HTN, DLD, as well as h/o CKD, CVA, prostate CA and hypothyroidism, who is being seen today for the evaluation of chest pain and elevated troponin, at the request of Dr. Maryland [name], Internal Medicine.  History of Present Illness:   [name] is a 84 y.o. male with no prior cardiac history but multiple cardiac risk factors including type 2DM, HTN, DLD, as well as h/o CKD, CVA, prostate CA and hypothyroidism, who is being seen today for the evaluation of chest pain and elevated troponin, at the request of Dr. Maryland [name], Internal Medicine.  The pt also tells me that he was told that he has Parkinson Disease about 1 year ago but I do not see this in his chart. He lives at an ALF. His wife of 33 years passed away in 03-11-22 of this year. He can ambulate but needs assistance. He alternates between a cane and walker and sometimes uses a scooter. He also exercises daily. He uses a recumbent exercise bike and arm bike, ~30 min a day w/o any exertional CP or dyspnea.   Yesterday morning, around 5:30 am, he developed sudden onset substernal chest pain. Non radiating. 6/10 pain. Felt like a deep ache. No associated dyspnea, n/v, diaphoresis, palpitations, or dizziness. He had a mild HA. He was already awake but still laying in bed when pain started. He recalls eating a vegetable pizza the night prior. A staff member at the ALF was with him at the time, helping him put on compression stockings. He recalls that they gave him some medicine to drink. He thinks it was something for GI upset/ reflux. It resolved his pain. Pain only lasted a total of ~30 min. No recurrence. He was  brought to [facility] for further evaluation.   Since [facility] to [facility], he has been CP free. Troponins are mildly elevated with flat trend, 0.17>>0.18>>0.16. BNP elevated at 431. CXR w/o active cardiopulmonary disease. He denies dyspnea. Lipid panel shows mildly elevated LDL at 106 mg/dL. BMP shows mild renal insuffiencey with SCr at 1.44. K normal at 4.3. 2D echo has been completed. Read pending. EKG shows SR with 1st degree AV block and PVCs.   Past Medical History:  Diagnosis Date  . Allergic rhinitis   . Arthritis    arthritis-kyphosis  . Asthmatic bronchitis   . Chronic kidney disease   . DM type 2 (diabetes mellitus, type 2) ([place])   . DVT, lower extremity, proximal, acute ([place])   . Dyslipidemia   . Dysrhythmia    '97 tachycardia- no recent issues  . GERD (gastroesophageal reflux disease)   . Hearing impaired    bilateral hearing aids  . History of kidney stones   . History of skin cancer   . Hypertension   . Hypothyroidism   . Macular degeneration    injections done bilaterally recent - 08-22-13  . Prostate cancer [facility])    Prostate cancer-radiation only,skin cancer-basal cell and last squamous cell right eye  . Stroke ([place])   . Urgency-frequency syndrome   . Urticaria     Past Surgical History:  Procedure Laterality Date  . BACK SURGERY     lumbar fracture  . cataract  surgery Bilateral   . COLONOSCOPY W/ POLYPECTOMY     x2   . CYSTOSCOPY WITH INJECTION N/A 09/11/2013   Procedure: CYSTOSCOPY WITH BOTOX INJECTION INTO THE BLADDER;  Surgeon: Ailene Rud, MD;  Location: [facility];  Service: Urology;  Laterality: N/A;  . CYSTOSCOPY WITH RETROGRADE PYELOGRAM, URETEROSCOPY AND STENT PLACEMENT    . CYSTOSCOPY WITH STENT PLACEMENT Right 01/26/2016   Procedure: CYSTOSCOPY, RIGHT RETROGRADE WITH RIGHT URETERAL STENT PLACEMENT;  Surgeon: Cleon Gustin, MD;  Location: [facility];  Service: Urology;  Laterality: Right;  . CYSTOSCOPY/RETROGRADE/URETEROSCOPY Right 04/22/2016    Procedure: CYSTOSCOPY/RETROGRADE/ REMOVAL JJ STENT right insertion stent right insertion of foley;  Surgeon: Franchot Gallo, MD;  Location: [facility];  Service: Urology;  Laterality: Right;  . EXTRACORPOREAL SHOCK WAVE LITHOTRIPSY Right 02/27/2016   Procedure: RIGHT EXTRACORPOREAL SHOCK WAVE LITHOTRIPSY (ESWL) GAITED;  Surgeon: Cleon Gustin, MD;  Location: [facility];  Service: Urology;  Laterality: Right;  . EXTRACORPOREAL SHOCK WAVE LITHOTRIPSY Right 02/10/2016   Procedure: EXTRACORPOREAL SHOCK WAVE LITHOTRIPSY (ESWL);  Surgeon: Kathie Rhodes, MD;  Location: [facility];  Service: Urology;  Laterality: Right;  [id number] A [employer]- [id number]   . EYE SURGERY     macular degeneration  . GALLBLADDER SURGERY  2006   no cholecystectomy  . HERNIA REPAIR    . LUNG REMOVAL, PARTIAL  age 63   for bronchiectasis  . PARTIAL THYMECTOMY  1985   for nodules  . POPLITEAL SYNOVIAL CYST EXCISION  2005  . TOTAL KNEE ARTHROPLASTY  2008   right  . TOTAL KNEE ARTHROPLASTY  2010   left     Home Medications:  Prior to Admission medications   Medication Sig Start Date End Date Taking? Authorizing Provider  albuterol (PROAIR HFA) 108 (90 Base) MCG/ACT inhaler Inhale 2 puffs into the lungs 3 (three) times daily as needed for wheezing or shortness of breath.   Yes [provider]  alfuzosin (UROXATRAL) 10 MG 24 hr tablet Take 1 tablet (10 mg total) by mouth at bedtime. 02/10/16  Yes Kathie Rhodes, MD  aspirin EC 81 MG tablet Take 81 mg by mouth daily.    Yes [provider]  atropine 1 % ophthalmic solution Place 1 drop into the right eye daily at 6pm for 14 days. 09/03/16  Yes [provider]  brimonidine-timolol (COMBIGAN) 0.2-0.5 % ophthalmic solution Place 1 drop into both eyes 2 times daily. 09/15/16  Yes [provider]  brinzolamide (AZOPT) 1 % ophthalmic suspension Place 1 drop into the right eye 3 times daily. 09/03/16  Yes [provider]  carbidopa-levodopa (SINEMET IR) 25-100 MG tablet Take 1 tablet by mouth 3 (three) times daily.    Yes [provider]  Cholecalciferol (VITAMIN D-3) 5000 units TABS Take 5,000 Units by mouth daily.   Yes [provider]  colesevelam (WELCHOL) 625 MG tablet Take 1,875 mg by mouth daily.    Yes [provider]  diltiazem (CARDIZEM CD) 120 MG 24 hr capsule Take 120 mg by mouth daily.   Yes [provider]  docusate sodium (COLACE) 100 MG capsule Take 100 mg by mouth daily.   Yes [provider]  fexofenadine (ALLEGRA) 180 MG tablet Take 180 mg by mouth daily.   Yes [provider]  furosemide (LASIX) 20 MG tablet Take 20 mg by mouth daily.   Yes [provider]  gabapentin (NEURONTIN) 100 MG capsule Take 100 mg by mouth at bedtime.   Yes  [provider]  guaiFENesin (MUCINEX) 600 MG 12 hr tablet Take by mouth 2 (two) times daily.   Yes [provider]  hydroxypropyl methylcellulose / hypromellose (ISOPTO TEARS / GONIOVISC) 2.5 % ophthalmic solution Place 1 drop into both eyes 4 (four) times daily.   Yes [provider]  latanoprost (XALATAN) 0.005 % ophthalmic solution Place 1 drop into the right eye daily. 07/28/16  Yes [provider]  levothyroxine (SYNTHROID, LEVOTHROID) 200 MCG tablet Take 200 mcg by mouth daily.    Yes [provider]  Meth-Hyo-M Bl-Na Phos-Ph Sal (URIBEL) 118 MG CAPS Take 1 capsule (118 mg total) by mouth 3 (three) times daily as needed (burning). Patient taking differently: Take 1 capsule by mouth 3 (three) times daily.  01/27/16  Yes McKenzie, Candee Furbish, MD  Multiple Vitamins-Minerals (ICAPS) TABS Take 1 tablet by mouth daily.   Yes [provider]  omeprazole (PRILOSEC) 40 MG capsule Take 40 mg by mouth daily.   Yes [provider]  polyethylene glycol (MIRALAX / GLYCOLAX) packet Take 17 g by mouth daily.   Yes [provider]    potassium chloride (K-DUR,KLOR-CON) 10 MEQ tablet Take 10 mEq by mouth daily.   Yes [provider]  sitaGLIPtin (JANUVIA) 50 MG tablet Take 1 tablet (50 mg total) by mouth daily. 10/25/15  Yes Tat, Shanon Brow, MD  traMADol (ULTRAM) 50 MG tablet Take 1 tablet (50 mg total) by mouth every 6 (six) hours as needed. Patient taking differently: Take 25 mg by mouth 2 (two) times daily.  01/26/16  Yes McKenzie, Candee Furbish, MD  zolpidem (AMBIEN) 5 MG tablet Take 0.5 tablets (2.5 mg total) by mouth at bedtime as needed for sleep. Patient taking differently: Take 5 mg by mouth at bedtime as needed for sleep.  07/17/16  Yes Young, Tarri Fuller D, MD  mirabegron ER (MYRBETRIQ) 50 MG TB24 tablet Take 1 tablet (50 mg total) by mouth daily. Patient not taking: Reported on 08/03/2017 01/28/16   Cleon Gustin, MD    Inpatient Medications: Scheduled Meds: . alfuzosin  10 mg Oral QHS  . aspirin EC  325 mg Oral Daily  . atropine  1 drop Right Eye TID  . brimonidine  1 drop Both Eyes BID   And  . timolol  1 drop Both Eyes BID  . brinzolamide  1 drop Right Eye TID  . carbidopa-levodopa  1 tablet Oral TID WC  . cholecalciferol  5,000 Units Oral Daily  . colesevelam  1,875 mg Oral Q breakfast  . diltiazem  120 mg Oral Daily  . docusate sodium  100 mg Oral Daily  . enoxaparin (LOVENOX) injection  40 mg Subcutaneous Daily  . gabapentin  100 mg Oral QHS  . hydroxypropyl methylcellulose / hypromellose  1 drop Both Eyes TID AC & HS  . insulin aspart  0-5 Units Subcutaneous QHS  . insulin aspart  0-9 Units Subcutaneous TID WC  . latanoprost  1 drop Right Eye QHS  . levothyroxine  200 mcg Oral QAC breakfast  . loratadine  10 mg Oral Daily  . multivitamin  1 tablet Oral Daily  . pantoprazole  40 mg Oral Daily  . polyethylene glycol  17 g Oral Daily  . URELLE  1 tablet Oral TID WC   Continuous Infusions: . sodium chloride 75 mL/hr at 08/04/17 0143   PRN Meds: acetaminophen, albuterol, guaiFENesin,  hydrALAZINE, ondansetron (ZOFRAN) IV, traMADol, zolpidem  Allergies:    Allergies  Allergen Reactions  .  Colchicine Anaphylaxis  . Hibiclens [Chlorhexidine] Hives  . Atorvastatin Other (See Comments)    Reaction:  Joint pain   . Ceftin Diarrhea  . Celebrex [Celecoxib] Swelling  . Codeine Hives  . Contrast Media [Iodinated Diagnostic Agents] Nausea And Vomiting and Rash  . Erythromycin Diarrhea  . Ezetimibe Other (See Comments)    Reaction:  Joint pain   . Oxycodone-Acetaminophen Hives  . Rosuvastatin Other (See Comments)    Reaction:  Joint pain   . Simvastatin Other (See Comments)    Reaction:  Joint pain   . Cefpodoxime Rash  . Cefuroxime Axetil Rash  . Nitroglycerin Other (See Comments)    Reaction:  Lowers pts BP  . Tetanus Toxoid Rash  . Vantin Other (See Comments)    Reaction:  Unknown     Social History:   Social History   Socioeconomic History  . Marital status: Married    Spouse name: Not on file  . Number of children: 2  . Years of education: Not on file  . Highest education level: Not on file  Occupational History  . Occupation: retired  Scientific laboratory technician  . Financial resource strain: Not on file  . Food insecurity:    Worry: Not on file    Inability: Not on file  . Transportation needs:    Medical: Not on file    Non-medical: Not on file  Tobacco Use  . Smoking status: Never Smoker  . Smokeless tobacco: Never Used  Substance and Sexual Activity  . Alcohol use: No  . Drug use: No  . Sexual activity: Not Currently  Lifestyle  . Physical activity:    Days per week: Not on file    Minutes per session: Not on file  . Stress: Not on file  Relationships  . Social connections:    Talks on phone: Not on file    Gets together: Not on file    Attends religious service: Not on file    Active member of club or organization: Not on file    Attends meetings of clubs or organizations: Not on file    Relationship status: Not on file  . Intimate partner  violence:    Fear of current or ex partner: Not on file    Emotionally abused: Not on file    Physically abused: Not on file    Forced sexual activity: Not on file  Other Topics Concern  . Not on file  Social History Narrative   MUST HAVE MEDS LISTED DO NOT SUBSTITUTE GENERICS PATIENT IS ALLERGIC TO DYES    Family History:    Family History  Problem Relation Age of Onset  . Other Sister        ophth disease  . Heart attack Father        x7  . Heart disease Father        MI x7  . Hypertension Mother   . Stroke Mother 44  . Amblyopia Neg Hx   . Blindness Neg Hx   . Glaucoma Neg Hx   . Macular degeneration Neg Hx   . Retinal detachment Neg Hx   . Strabismus Neg Hx   . Retinitis pigmentosa Neg Hx   . Cataracts Neg Hx      ROS:  Please see the history of present illness.   All other ROS reviewed and negative.     Physical Exam/Data:   Vitals:   08/04/17 0058 08/04/17 0258 08/04/17 0810 08/04/17 1155  BP:  Marland Kitchen)  147/77 (!) 150/94 (!) 155/86  Pulse:   71   Resp:  18 17 18   Temp:  98 F (36.7 C) 97.6 F (36.4 C) (!) 97.4 F (36.3 C)  TempSrc:  Oral Oral Oral  SpO2:  98% 98% 98%  Weight: 160 lb 14.4 oz (73 kg)     Height: 5\' 11"  (1.803 m)       Intake/Output Summary (Last 24 hours) at 08/04/2017 1437 Last data filed at 08/04/2017 1200 Gross per 24 hour  Intake 231.09 ml  Output 975 ml  Net -743.91 ml   Filed Weights   08/03/17 1649 08/04/17 0058  Weight: 160 lb (72.6 kg) 160 lb 14.4 oz (73 kg)   Body mass index is 22.44 kg/m.  General:  Elderly, frail appearing WM in no acute distress HEENT: normal Lymph: no adenopathy Neck: no JVD Endocrine:  No thryomegaly Vascular: No carotid bruits; FA pulses 2+ bilaterally without bruits  Cardiac:  normal S1, S2; RRR; no murmur  Lungs:  clear to auscultation bilaterally, no wheezing, rhonchi or rales  Abd: soft, nontender, no hepatomegaly  Ext: no edema Musculoskeletal:  No deformities, BUE and BLE strength normal  and equal Skin: warm and dry  Neuro:  CNs 2-12 intact, no focal abnormalities noted Psych:  Normal affect   EKG:  The EKG was personally reviewed and demonstrates:  SR with 1st degree AV block and PVCs Telemetry:  Telemetry was personally reviewed and demonstrates:  SR with PVCs  Relevant CV Studies: 2D Echo 08/04/17 pending  Laboratory Data:  Chemistry Recent Labs  Lab 08/03/17 1654  NA 140  K 4.3  CL 103  CO2 27  GLUCOSE 108*  BUN 25*  CREATININE 1.44*  CALCIUM 9.2  GFRNONAA 43*  GFRAA 50*  ANIONGAP 10    No results for input(s): PROT, ALBUMIN, AST, ALT, ALKPHOS, BILITOT in the last 168 hours. Hematology Recent Labs  Lab 08/03/17 1654  WBC 5.5  RBC 4.24  HGB 12.3*  HCT 39.0  MCV 92.0  MCH 29.0  MCHC 31.5  RDW 12.5  PLT 190   Cardiac Enzymes Recent Labs  Lab 08/03/17 2014 08/03/17 2326 08/04/17 0320 08/04/17 0855  TROPONINI 0.17* 0.18* 0.16* 0.12*    Recent Labs  Lab 08/03/17 1713 08/03/17 2020  TROPIPOC 0.19* 0.16*    BNP Recent Labs  Lab 08/04/17 0320  BNP 431.0*    DDimer No results for input(s): DDIMER in the last 168 hours.  Radiology/Studies:  Dg Chest 2 View  Result Date: 08/03/2017 CLINICAL DATA:  Chest pain EXAM: CHEST - 2 VIEW COMPARISON:  01/25/2016 FINDINGS: Heart size and vascularity normal. Probable COPD. Negative for infiltrate effusion or mass. Numerous small calcifications in the left lung base are chronic and unchanged. IMPRESSION: No active cardiopulmonary disease. Electronically Signed   By: Franchot Gallo M.D.   On: 08/03/2017 18:33    Assessment and Plan:   [name] is a 84 y.o. male with no prior cardiac history but multiple cardiac risk factors including type 2DM, HTN, DLD, as well as h/o CKD, CVA, prostate CA, hypothyroidism and Parkinson Disease, per pt report, who is being seen today for the evaluation of chest pain and elevated troponin, at the request of Dr. Maryland [name], Internal Medicine.  1. Chest Pain:  some atypical features as noted above in HPI and pain improved after OTC reflux meds were given. However his troponins are mildly elevated but with flat trend, 0.17>>0.12>>0.16>>0.12. He is currently CP free and denies any  recurrence since yesterday morning. EKG shows SR with 1st degree AV block and PVCs. Occasional PVCs also noted on tele. CXR w/o active cardiopulmonary disease. BNP mildly elevated at 431 but this is also in the setting of advanced age and CKD w/ GFR at 43 mL/min. He does not appear to be grossly volume overloaded. Echo has been completed. We are still awaiting report. Given is troponin elevation, we will plan for cardiac cath tomorrow (cors only. No LV gram to limit dye exposure).  We will hold lasix. BMP in the am. Will give light hydration.   2. HTN: pressures have been mild-moderately  elevated. He is currently on Cardizem 120 mg daily. No other agents. Frequent PVCs noted on EKG and tele. May consider slight increase in dose.    3. DLD: FLP shows LDL at 106 mg/dL. Currently not on statin therapy. History of statin intolerance.  4. DM: controlled. Hgb A1c 6.3. Management per primary team.     For questions or updates, please contact [place] Please consult [email or web address] for contact info under Cardiology/STEMI.   Signed, Lyda Jester, PA-C  08/04/2017 2:37 PM

## 2017-08-04 NOTE — Progress Notes (Signed)
[facility] PROGRESS NOTE  [name] ZJI:[id number] DOB: 01/12/1932 DOA: 08/03/2017  PCP: Lajean Manes, MD  Brief History/Interval Summary: 84 year old Caucasian male with past medical history of stroke, chronic kidney disease, hypertension, dyslipidemia, diabetes mellitus type 2, history of prostate cancer and hypothyroidism presented with chest pain.  Patient was found to have mild elevation in troponin.  No previous history of heart disease.  Hospitalized for further management.  Reason for Visit: Elevated troponin in the setting of chest pain  Consultants: Cardiology  Procedures: Transthoracic echocardiogram is pending  Antibiotics: None  Subjective/Interval History: Patient states that he feels well this morning.  Denies any further episodes of chest pain.  Denies any shortness of breath.  No nausea vomiting.  ROS: Denies any headaches  Objective:  Vital Signs  Vitals:   08/03/17 2315 08/04/17 0058 08/04/17 0258 08/04/17 0810  BP: (!) 158/92  (!) 147/77 (!) 150/94  Pulse:    71  Resp: 17  18 17   Temp:   98 F (36.7 C) 97.6 F (36.4 C)  TempSrc:   Oral Oral  SpO2:   98% 98%  Weight:  73 kg (160 lb 14.4 oz)    Height:  5\' 11"  (1.803 m)      Intake/Output Summary (Last 24 hours) at 08/04/2017 1139 Last data filed at 08/04/2017 1000 Gross per 24 hour  Intake 231.09 ml  Output 825 ml  Net -593.91 ml   Filed Weights   08/03/17 1649 08/04/17 0058  Weight: 72.6 kg (160 lb) 73 kg (160 lb 14.4 oz)    General appearance: alert, cooperative, appears stated age and no distress Head: Normocephalic, without obvious abnormality, atraumatic Resp: clear to auscultation bilaterally Cardio: S1-S2 normal regular.  No S3-S4.  Systolic murmur appreciated over the precordium.  No rubs or bruit.  No pedal edema GI: soft, non-tender; bowel sounds normal; no masses,  no organomegaly Extremities: extremities normal, atraumatic, no cyanosis or edema Pulses: 2+ and  symmetric Neurologic: No focal neurological deficits.  Lab Results:  Data Reviewed: I have personally reviewed following labs and imaging studies  CBC: Recent Labs  Lab 08/03/17 1654  WBC 5.5  HGB 12.3*  HCT 39.0  MCV 92.0  PLT 175    Basic Metabolic Panel: Recent Labs  Lab 08/03/17 1654  NA 140  K 4.3  CL 103  CO2 27  GLUCOSE 108*  BUN 25*  CREATININE 1.44*  CALCIUM 9.2    GFR: Estimated Creatinine Clearance: 40.1 mL/min (A) (by C-G formula based on SCr of 1.44 mg/dL (H)).  Cardiac Enzymes: Recent Labs  Lab 08/03/17 2014 08/03/17 2326 08/04/17 0320 08/04/17 0855  TROPONINI 0.17* 0.18* 0.16* 0.12*    HbA1C: Recent Labs    08/04/17 0320  HGBA1C 6.3*    CBG: Recent Labs  Lab 08/04/17 0159 08/04/17 0814  GLUCAP 159* 81    Lipid Profile: Recent Labs    08/04/17 0320  CHOL 157  HDL 40*  LDLCALC 106*  TRIG 57  CHOLHDL 3.9     Recent Results (from the past 240 hour(s))  MRSA PCR Screening     Status: None   Collection Time: 08/04/17  1:00 AM  Result Value Ref Range Status   MRSA by PCR NEGATIVE NEGATIVE Final    Comment:        The GeneXpert MRSA Assay (FDA approved for NASAL specimens only), is one component of a comprehensive MRSA colonization surveillance program. It is not intended to diagnose MRSA infection nor to  guide or monitor treatment for MRSA infections. Performed at [facility], [place] [address], [place], [place] [address]       Radiology Studies: Dg Chest 2 View  Result Date: 08/03/2017 CLINICAL DATA:  Chest pain EXAM: CHEST - 2 VIEW COMPARISON:  01/25/2016 FINDINGS: Heart size and vascularity normal. Probable COPD. Negative for infiltrate effusion or mass. Numerous small calcifications in the left lung base are chronic and unchanged. IMPRESSION: No active cardiopulmonary disease. Electronically Signed   By: Franchot Gallo M.D.   On: 08/03/2017 18:33     Medications:  Scheduled: . alfuzosin  10 mg Oral  QHS  . aspirin EC  325 mg Oral Daily  . atropine  1 drop Right Eye TID  . brimonidine  1 drop Both Eyes BID   And  . timolol  1 drop Both Eyes BID  . brinzolamide  1 drop Right Eye TID  . carbidopa-levodopa  1 tablet Oral TID WC  . cholecalciferol  5,000 Units Oral Daily  . colesevelam  1,875 mg Oral Q breakfast  . diltiazem  120 mg Oral Daily  . docusate sodium  100 mg Oral Daily  . enoxaparin (LOVENOX) injection  40 mg Subcutaneous Daily  . gabapentin  100 mg Oral QHS  . hydroxypropyl methylcellulose / hypromellose  1 drop Both Eyes TID AC & HS  . insulin aspart  0-5 Units Subcutaneous QHS  . insulin aspart  0-9 Units Subcutaneous TID WC  . latanoprost  1 drop Right Eye QHS  . levothyroxine  200 mcg Oral QAC breakfast  . loratadine  10 mg Oral Daily  . multivitamin  1 tablet Oral Daily  . pantoprazole  40 mg Oral Daily  . polyethylene glycol  17 g Oral Daily  . URELLE  1 tablet Oral TID WC   Continuous: . sodium chloride 75 mL/hr at 08/04/17 0143   ZSM:[id number], albuterol, guaiFENesin, hydrALAZINE, ondansetron (ZOFRAN) IV, traMADol, zolpidem  Assessment/Plan:    Chest pain with mildly elevated troponin EKG did not show any ischemic changes.  Patient is currently chest pain-free.  Chest x-ray without any acute findings.  Cardiology consulted due to elevated troponin.  Continue aspirin.  Patient without any previous history of coronary artery disease.  Echocardiogram has been ordered.  Hypothyroidism Continue home medications.  Dyslipidemia Patient allergic to statin.  Is noted to be on WelChol.  LDL 106.  History of stroke Stable.  Noted to be on aspirin.    Essential hypertension Continue home medications.  Monitor blood pressures closely.  Furosemide is on hold.  Chronic kidney disease stage III Baseline creatinine around 1.4-1.5.  Seems to be at baseline.  Monitor closely.  Diabetes mellitus type 2 with renal manifestations Monitor CBGs.  SSI.  HbA1c  6.3.  GERD PPI  DVT Prophylaxis: Lovenox    Code Status: DNR Family Communication: Discussed with the patient.  No family at bedside. Disposition Plan: Await cardiology input.    LOS: 0 days   [facility] [facility] Pager [phone] 08/04/2017, 11:39 AM  If 7PM-7AM, please contact night-coverage at [email or web address], password [facility]

## 2017-08-05 ENCOUNTER — Encounter (HOSPITAL_COMMUNITY)
Admission: EM | Disposition: A | Discharge status: Skilled Nursing Facility | Payer: Self-pay | Source: Home / Self Care | Attending: Internal Medicine

## 2017-08-05 ENCOUNTER — Observation Stay (HOSPITAL_COMMUNITY): Payer: Medicare Other

## 2017-08-05 DIAGNOSIS — E039 Hypothyroidism, unspecified: Secondary | ICD-10-CM | POA: Diagnosis not present

## 2017-08-05 DIAGNOSIS — R079 Chest pain, unspecified: Secondary | ICD-10-CM

## 2017-08-05 DIAGNOSIS — N183 Chronic kidney disease, stage 3 (moderate): Secondary | ICD-10-CM | POA: Diagnosis not present

## 2017-08-05 DIAGNOSIS — R748 Abnormal levels of other serum enzymes: Secondary | ICD-10-CM | POA: Diagnosis not present

## 2017-08-05 LAB — CBC
HEMATOCRIT: 41.4 % (ref 39.0–52.0)
Hemoglobin: 13.2 g/dL (ref 13.0–17.0)
MCH: 28.3 pg (ref 26.0–34.0)
MCHC: 31.9 g/dL (ref 30.0–36.0)
MCV: 88.7 fL (ref 78.0–100.0)
PLATELETS: 195 10*3/uL (ref 150–400)
RBC: 4.67 MIL/uL (ref 4.22–5.81)
RDW: 12.3 % (ref 11.5–15.5)
WBC: 5.6 10*3/uL (ref 4.0–10.5)

## 2017-08-05 LAB — NM MYOCAR MULTI W/SPECT W/WALL MOTION / EF
CSEPED: 0 min
CSEPEDS: 0 s
CSEPEW: 1 METS
Peak HR: 103 {beats}/min
Rest HR: 80 {beats}/min

## 2017-08-05 LAB — GLUCOSE, CAPILLARY
GLUCOSE-CAPILLARY: 110 mg/dL — AB (ref 70–99)
Glucose-Capillary: 103 mg/dL — ABNORMAL HIGH (ref 70–99)
Glucose-Capillary: 179 mg/dL — ABNORMAL HIGH (ref 70–99)
Glucose-Capillary: 151 mg/dL — ABNORMAL HIGH (ref 70–99)

## 2017-08-05 LAB — BASIC METABOLIC PANEL
Anion gap: 8 (ref 5–15)
BUN: 20 mg/dL (ref 8–23)
CALCIUM: 9.3 mg/dL (ref 8.9–10.3)
CO2: 25 mmol/L (ref 22–32)
CREATININE: 1.26 mg/dL — AB (ref 0.61–1.24)
Chloride: 106 mmol/L (ref 98–111)
GFR, EST AFRICAN AMERICAN: 59 mL/min — AB (ref >60)
GFR, EST NON AFRICAN AMERICAN: 51 mL/min — AB (ref >60)
GLUCOSE: 96 mg/dL (ref 70–99)
Potassium: 3.8 mmol/L (ref 3.5–5.1)
Sodium: 139 mmol/L (ref 135–145)

## 2017-08-05 SURGERY — LEFT HEART CATH AND CORONARY ANGIOGRAPHY
Anesthesia: LOCAL

## 2017-08-05 MED ORDER — SODIUM CHLORIDE 0.9 % WEIGHT BASED INFUSION
1.0000 mL/kg/h | INTRAVENOUS | Status: DC
  Administered 2017-08-05: 1 mL/kg/h via INTRAVENOUS

## 2017-08-05 MED ORDER — SODIUM CHLORIDE 0.9 % IV SOLN: 250.0000 mL | INTRAVENOUS | Status: DC | PRN

## 2017-08-05 MED ORDER — REGADENOSON 0.4 MG/5ML IV SOLN
0.4000 mg | Freq: Once | INTRAVENOUS | Status: AC
  Administered 2017-08-05: 0.4 mg via INTRAVENOUS
  Filled 2017-08-05: qty 5

## 2017-08-05 MED ORDER — TECHNETIUM TC 99M TETROFOSMIN IV KIT
10.0000 | PACK | Freq: Once | INTRAVENOUS | Status: AC | PRN
  Administered 2017-08-05: 10 via INTRAVENOUS

## 2017-08-05 MED ORDER — SODIUM CHLORIDE 0.9% FLUSH: 3.0000 mL | INTRAVENOUS | Status: DC | PRN

## 2017-08-05 MED ORDER — ASPIRIN 81 MG PO CHEW
81.0000 mg | CHEWABLE_TABLET | ORAL | Status: AC
  Administered 2017-08-06: 81 mg via ORAL
  Filled 2017-08-05: qty 1

## 2017-08-05 MED ORDER — TECHNETIUM TC 99M TETROFOSMIN IV KIT
30.0000 | PACK | Freq: Once | INTRAVENOUS | Status: AC | PRN
  Administered 2017-08-05: 30 via INTRAVENOUS

## 2017-08-05 MED ORDER — DIPHENHYDRAMINE HCL 25 MG PO CAPS
50.0000 mg | ORAL_CAPSULE | Freq: Once | ORAL | Status: AC
  Administered 2017-08-06: 50 mg via ORAL
  Filled 2017-08-05 (×2): qty 2

## 2017-08-05 MED ORDER — SODIUM CHLORIDE 0.9% FLUSH
3.0000 mL | Freq: Two times a day (BID) | INTRAVENOUS | Status: DC
  Administered 2017-08-05 – 2017-08-06 (×2): 3 mL via INTRAVENOUS

## 2017-08-05 MED ORDER — PREDNISONE 50 MG PO TABS
50.0000 mg | ORAL_TABLET | Freq: Four times a day (QID) | ORAL | Status: AC
  Administered 2017-08-06 (×3): 50 mg via ORAL
  Filled 2017-08-05 (×3): qty 1

## 2017-08-05 MED ORDER — DIPHENHYDRAMINE HCL 50 MG/ML IJ SOLN: 50.0000 mg | Freq: Once | INTRAMUSCULAR | Status: AC

## 2017-08-05 MED ORDER — REGADENOSON 0.4 MG/5ML IV SOLN
INTRAVENOUS | Status: AC
  Filled 2017-08-05: qty 5

## 2017-08-05 NOTE — Progress Notes (Signed)
[facility] PROGRESS NOTE  [name] RWE:[id number] DOB: 06-17-1931 DOA: 08/03/2017  PCP: Lajean Manes, MD  Brief History/Interval Summary: 84 year old Caucasian male with past medical history of stroke, chronic kidney disease, hypertension, dyslipidemia, diabetes mellitus type 2, history of prostate cancer and hypothyroidism presented with chest pain.  Patient was found to have mild elevation in troponin.  No previous history of heart disease.  Hospitalized for further management.  Reason for Visit: Elevated troponin in the setting of chest pain  Consultants: Cardiology  Procedures:  Transthoracic echocardiogram Study Conclusions  - Left ventricle: The cavity size was normal. There was mild focal   basal hypertrophy of the septum. Systolic function was normal.   The estimated ejection fraction was in the range of 55% to 60%.   Wall motion was normal; there were no regional wall motion   abnormalities. Features are consistent with a pseudonormal left   ventricular filling pattern, with concomitant abnormal relaxation   and increased filling pressure (grade 2 diastolic dysfunction). - Mitral valve: Calcified annulus. Mildly thickened, mildly   calcified leaflets . Mild diffuse thickening of the anterior   leaflet, consistent with rheumatic disease. - Left atrium: The atrium was moderately dilated. - Atrial septum: No defect or patent foramen ovale was identified.  Nuclear stress test  There was no ST segment deviation noted during stress.  Findings consistent with ischemia.  The left ventricular ejection fraction is normal (55-65%).  This is an intermediate risk study. Thinning of the inferior wall Moderate area of lateral wall ischemia at mid and basal level EF 58%  Antibiotics: None  Subjective/Interval History: That he had a rough night due to back pain.  He has chronic back pain and looks like staying in the bed has exacerbated it.  Denies any chest  pain or shortness of breath.    ROS: Denies any nausea or vomiting  Objective:  Vital Signs  Vitals:   08/05/17 0937 08/05/17 0940 08/05/17 0942 08/05/17 1140  BP: (!) 179/102 125/67 122/67 131/76  Pulse:    94  Resp:    18  Temp:    97.7 F (36.5 C)  TempSrc:    Oral  SpO2:    99%  Weight:      Height:        Intake/Output Summary (Last 24 hours) at 08/05/2017 1431 Last data filed at 08/05/2017 1300 Gross per 24 hour  Intake 340 ml  Output 1050 ml  Net -710 ml   Filed Weights   08/03/17 1649 08/04/17 0058 08/05/17 0500  Weight: 72.6 kg (160 lb) 73 kg (160 lb 14.4 oz) 65.2 kg (143 lb 12.8 oz)    General appearance: Awake alert.  In no distress. Resp: Clear to auscultation bilaterally. Cardio: S1-S2 is normal regular.  No S3-S4.  Systolic murmur appreciated over the precordium.  No rubs or bruit.  No significant pedal edema. GI: Abdomen is soft.  Nontender nondistended. Extremities: extremities normal, atraumatic, no cyanosis or edema Neurologic: No focal neurological deficits.  Lab Results:  Data Reviewed: I have personally reviewed following labs and imaging studies  CBC: Recent Labs  Lab 08/03/17 1654 08/05/17 0306  WBC 5.5 5.6  HGB 12.3* 13.2  HCT 39.0 41.4  MCV 92.0 88.7  PLT 190 619    Basic Metabolic Panel: Recent Labs  Lab 08/03/17 1654 08/05/17 0306  NA 140 139  K 4.3 3.8  CL 103 106  CO2 27 25  GLUCOSE 108* 96  BUN 25* 20  CREATININE 1.44* 1.26*  CALCIUM 9.2 9.3    GFR: Estimated Creatinine Clearance: 41 mL/min (A) (by C-G formula based on SCr of 1.26 mg/dL (H)).  Cardiac Enzymes: Recent Labs  Lab 08/03/17 2014 08/03/17 2326 08/04/17 0320 08/04/17 0855 08/04/17 1512  TROPONINI 0.17* 0.18* 0.16* 0.12* 0.11*    HbA1C: Recent Labs    08/04/17 0320  HGBA1C 6.3*    CBG: Recent Labs  Lab 08/04/17 1154 08/04/17 1540 08/04/17 2115 08/05/17 0725 08/05/17 1222  GLUCAP 110* 105* 117* 103* 179*    Lipid Profile: Recent  Labs    08/04/17 0320  CHOL 157  HDL 40*  LDLCALC 106*  TRIG 57  CHOLHDL 3.9     Recent Results (from the past 240 hour(s))  MRSA PCR Screening     Status: None   Collection Time: 08/04/17  1:00 AM  Result Value Ref Range Status   MRSA by PCR NEGATIVE NEGATIVE Final    Comment:        The GeneXpert MRSA Assay (FDA approved for NASAL specimens only), is one component of a comprehensive MRSA colonization surveillance program. It is not intended to diagnose MRSA infection nor to guide or monitor treatment for MRSA infections. Performed at [facility], [place] [address] [place], [place] [address]       Radiology Studies: Dg Chest 2 View  Result Date: 08/03/2017 CLINICAL DATA:  Chest pain EXAM: CHEST - 2 VIEW COMPARISON:  01/25/2016 FINDINGS: Heart size and vascularity normal. Probable COPD. Negative for infiltrate effusion or mass. Numerous small calcifications in the left lung base are chronic and unchanged. IMPRESSION: No active cardiopulmonary disease. Electronically Signed   By: Franchot Gallo M.D.   On: 08/03/2017 18:33   Nm Myocar Multi W/spect W/wall Motion / Ef  Result Date: 08/05/2017  There was no ST segment deviation noted during stress.  Findings consistent with ischemia.  The left ventricular ejection fraction is normal (55-65%).  This is an intermediate risk study.  Thinning of the inferior wall Moderate area of lateral wall ischemia at mid and basal level EF 58%     Medications:  Scheduled: . alfuzosin  10 mg Oral QHS  . aspirin EC  325 mg Oral Daily  . atropine  1 drop Right Eye TID  . brimonidine  1 drop Both Eyes BID   And  . timolol  1 drop Both Eyes BID  . brinzolamide  1 drop Right Eye TID  . carbidopa-levodopa  1 tablet Oral TID WC  . cholecalciferol  5,000 Units Oral Daily  . colesevelam  1,875 mg Oral Q breakfast  . diltiazem  120 mg Oral Daily  . docusate sodium  100 mg Oral Daily  . enoxaparin (LOVENOX) injection  40 mg  Subcutaneous Daily  . gabapentin  100 mg Oral QHS  . hydroxypropyl methylcellulose / hypromellose  1 drop Both Eyes TID AC & HS  . insulin aspart  0-5 Units Subcutaneous QHS  . insulin aspart  0-9 Units Subcutaneous TID WC  . latanoprost  1 drop Right Eye QHS  . levothyroxine  200 mcg Oral QAC breakfast  . loratadine  10 mg Oral Daily  . multivitamin  1 tablet Oral Daily  . pantoprazole  40 mg Oral Daily  . polyethylene glycol  17 g Oral Daily  . regadenoson      . URELLE  1 tablet Oral TID WC   Continuous:  ATF:[id number], albuterol, guaiFENesin, hydrALAZINE, ondansetron (ZOFRAN) IV, traMADol, zolpidem  Assessment/Plan:   Chest pain with mildly elevated troponin EKG did not show any ischemic changes.  Patient has been chest pain-free.  Chest x-ray did not show any acute findings.  Troponins were minimally elevated.  Cardiology was consulted.  Aspirin was continued.  Patient without any previous history of coronary artery disease.  Echocardiogram as above.  Patient underwent stress test today which has been reported as being intermediate risk with ischemia in the lateral wall.  Discussed with cardiology.  Tentative plan is for cardiac catheterization tomorrow.  Cardiology to discuss with patient today.    Hypothyroidism Continue home medications.  Dyslipidemia Patient allergic to statin.  Is noted to be on WelChol.  LDL 106.  Chronic diastolic CHF Grade 2 diastolic dysfunction noted on echocardiogram.  Patient appears to be euvolemic currently.  History of stroke Stable.  Noted to be on aspirin.    Essential hypertension Continue home medications.  Monitor blood pressures closely.  Furosemide is on hold.  Chronic kidney disease stage III Baseline creatinine around 1.4-1.5.  Seems to be at baseline.  Monitor closely.  Diabetes mellitus type 2 with renal manifestations Monitor CBGs.  SSI.  HbA1c 6.3.  GERD PPI  DVT Prophylaxis: Lovenox    Code Status: DNR Family  Communication: Discussed with patient Disposition Plan: Await further cardiac work-up.    LOS: 0 days   [facility] Pager [phone] 08/05/2017, 2:31 PM  If 7PM-7AM, please contact night-coverage at [email or web address], password [facility]

## 2017-08-05 NOTE — Progress Notes (Signed)
  I informed pt of abnormal stress test findings and recommendations for definitive cardiac catheterization. I have reviewed the risks, indications, and alternatives to cardiac catheterization and possible angioplasty/stenting with the patient. Risks include but are not limited to bleeding, infection, vascular injury, stroke, myocardial infection, arrhythmia, kidney injury, radiation-related injury in the case of prolonged fluoroscopy use, emergency cardiac surgery, and death. The patient understands the risks of serious complication is low (<5%). Family was present by bedside (sister and brother-in-law).   Lyda Jester, PA-C 08/05/2017

## 2017-08-05 NOTE — Progress Notes (Signed)
Patient has intermediate risk stress test with evidence of lateral wall ischemia.  Given patient's presentation with severe chest pain and abnormal nuclear stress test as well as the elevated troponins, recommend proceeding with left heart catheterization.  Patient's creatinine is 1.26 with but with his history of chronic kidney disease would do a coronary only cath with no LV gram.  He also has a contrast dye allergy and we will prophylax.

## 2017-08-05 NOTE — H&P (View-Only) (Signed)
  I informed pt of abnormal stress test findings and recommendations for definitive cardiac catheterization. I have reviewed the risks, indications, and alternatives to cardiac catheterization and possible angioplasty/stenting with the patient. Risks include but are not limited to bleeding, infection, vascular injury, stroke, myocardial infection, arrhythmia, kidney injury, radiation-related injury in the case of prolonged fluoroscopy use, emergency cardiac surgery, and death. The patient understands the risks of serious complication is low (<6%). Family was present by bedside (sister and brother-in-law).   Lyda Jester, PA-C 08/05/2017

## 2017-08-05 NOTE — Progress Notes (Signed)
   [name] presented for a nuclear stress test today.  No immediate complications.  Stress imaging is pending at this time.  Preliminary EKG findings may be listed in the chart, but the stress test result will not be finalized until perfusion imaging is complete.  One day study.  Abigail Butts, PA-C 08/05/2017, 9:45 AM

## 2017-08-06 ENCOUNTER — Encounter (HOSPITAL_COMMUNITY)
Admission: EM | Disposition: A | Discharge status: Skilled Nursing Facility | Payer: Self-pay | Source: Home / Self Care | Attending: Internal Medicine

## 2017-08-06 DIAGNOSIS — Z885 Allergy status to narcotic agent status: Secondary | ICD-10-CM | POA: Diagnosis not present

## 2017-08-06 DIAGNOSIS — N179 Acute kidney failure, unspecified: Secondary | ICD-10-CM | POA: Diagnosis not present

## 2017-08-06 DIAGNOSIS — K219 Gastro-esophageal reflux disease without esophagitis: Secondary | ICD-10-CM | POA: Diagnosis not present

## 2017-08-06 DIAGNOSIS — I10 Essential (primary) hypertension: Secondary | ICD-10-CM | POA: Diagnosis not present

## 2017-08-06 DIAGNOSIS — I214 Non-ST elevation (NSTEMI) myocardial infarction: Secondary | ICD-10-CM | POA: Diagnosis not present

## 2017-08-06 DIAGNOSIS — G20 Parkinson's disease: Secondary | ICD-10-CM | POA: Diagnosis present

## 2017-08-06 DIAGNOSIS — Z85828 Personal history of other malignant neoplasm of skin: Secondary | ICD-10-CM | POA: Diagnosis not present

## 2017-08-06 DIAGNOSIS — N189 Chronic kidney disease, unspecified: Secondary | ICD-10-CM | POA: Diagnosis not present

## 2017-08-06 DIAGNOSIS — E039 Hypothyroidism, unspecified: Secondary | ICD-10-CM | POA: Diagnosis not present

## 2017-08-06 DIAGNOSIS — E1122 Type 2 diabetes mellitus with diabetic chronic kidney disease: Secondary | ICD-10-CM | POA: Diagnosis not present

## 2017-08-06 DIAGNOSIS — E785 Hyperlipidemia, unspecified: Secondary | ICD-10-CM | POA: Diagnosis not present

## 2017-08-06 DIAGNOSIS — Z888 Allergy status to other drugs, medicaments and biological substances status: Secondary | ICD-10-CM | POA: Diagnosis not present

## 2017-08-06 DIAGNOSIS — Z7951 Long term (current) use of inhaled steroids: Secondary | ICD-10-CM | POA: Diagnosis not present

## 2017-08-06 DIAGNOSIS — I5032 Chronic diastolic (congestive) heart failure: Secondary | ICD-10-CM | POA: Diagnosis not present

## 2017-08-06 DIAGNOSIS — Z7982 Long term (current) use of aspirin: Secondary | ICD-10-CM | POA: Diagnosis not present

## 2017-08-06 DIAGNOSIS — I130 Hypertensive heart and chronic kidney disease with heart failure and stage 1 through stage 4 chronic kidney disease, or unspecified chronic kidney disease: Secondary | ICD-10-CM | POA: Diagnosis not present

## 2017-08-06 DIAGNOSIS — I2583 Coronary atherosclerosis due to lipid rich plaque: Secondary | ICD-10-CM | POA: Diagnosis not present

## 2017-08-06 DIAGNOSIS — Z8546 Personal history of malignant neoplasm of prostate: Secondary | ICD-10-CM | POA: Diagnosis not present

## 2017-08-06 DIAGNOSIS — R748 Abnormal levels of other serum enzymes: Secondary | ICD-10-CM | POA: Diagnosis not present

## 2017-08-06 DIAGNOSIS — Z79899 Other long term (current) drug therapy: Secondary | ICD-10-CM | POA: Diagnosis not present

## 2017-08-06 DIAGNOSIS — Z96653 Presence of artificial knee joint, bilateral: Secondary | ICD-10-CM | POA: Diagnosis present

## 2017-08-06 DIAGNOSIS — H9190 Unspecified hearing loss, unspecified ear: Secondary | ICD-10-CM | POA: Diagnosis present

## 2017-08-06 DIAGNOSIS — Z91041 Radiographic dye allergy status: Secondary | ICD-10-CM | POA: Diagnosis not present

## 2017-08-06 DIAGNOSIS — Z8673 Personal history of transient ischemic attack (TIA), and cerebral infarction without residual deficits: Secondary | ICD-10-CM | POA: Diagnosis not present

## 2017-08-06 DIAGNOSIS — Z974 Presence of external hearing-aid: Secondary | ICD-10-CM | POA: Diagnosis not present

## 2017-08-06 DIAGNOSIS — I2510 Atherosclerotic heart disease of native coronary artery without angina pectoris: Secondary | ICD-10-CM | POA: Diagnosis not present

## 2017-08-06 DIAGNOSIS — N183 Chronic kidney disease, stage 3 (moderate): Secondary | ICD-10-CM | POA: Diagnosis not present

## 2017-08-06 DIAGNOSIS — H3530 Unspecified macular degeneration: Secondary | ICD-10-CM | POA: Diagnosis present

## 2017-08-06 DIAGNOSIS — Z886 Allergy status to analgesic agent status: Secondary | ICD-10-CM | POA: Diagnosis not present

## 2017-08-06 HISTORY — PX: LEFT HEART CATH AND CORONARY ANGIOGRAPHY: CATH118249

## 2017-08-06 HISTORY — PX: CORONARY STENT INTERVENTION: CATH118234

## 2017-08-06 LAB — POCT ACTIVATED CLOTTING TIME: Activated Clotting Time: 422 seconds

## 2017-08-06 LAB — BASIC METABOLIC PANEL
Anion gap: 9 (ref 5–15)
BUN: 27 mg/dL — ABNORMAL HIGH (ref 8–23)
CO2: 25 mmol/L (ref 22–32)
CREATININE: 1.43 mg/dL — AB (ref 0.61–1.24)
Calcium: 8.9 mg/dL (ref 8.9–10.3)
Chloride: 105 mmol/L (ref 98–111)
GFR, EST AFRICAN AMERICAN: 51 mL/min — AB (ref >60)
GFR, EST NON AFRICAN AMERICAN: 44 mL/min — AB (ref >60)
GLUCOSE: 95 mg/dL (ref 70–99)
Potassium: 3.6 mmol/L (ref 3.5–5.1)
Sodium: 139 mmol/L (ref 135–145)

## 2017-08-06 LAB — CBC
HCT: 39.2 % (ref 39.0–52.0)
Hemoglobin: 12.7 g/dL — ABNORMAL LOW (ref 13.0–17.0)
MCH: 28.7 pg (ref 26.0–34.0)
MCHC: 32.4 g/dL (ref 30.0–36.0)
MCV: 88.7 fL (ref 78.0–100.0)
PLATELETS: 199 10*3/uL (ref 150–400)
RBC: 4.42 MIL/uL (ref 4.22–5.81)
RDW: 12.2 % (ref 11.5–15.5)
WBC: 6.3 10*3/uL (ref 4.0–10.5)

## 2017-08-06 LAB — GLUCOSE, CAPILLARY
GLUCOSE-CAPILLARY: 192 mg/dL — AB (ref 70–99)
GLUCOSE-CAPILLARY: 162 mg/dL — AB (ref 70–99)
GLUCOSE-CAPILLARY: 213 mg/dL — AB (ref 70–99)
Glucose-Capillary: 150 mg/dL — ABNORMAL HIGH (ref 70–99)

## 2017-08-06 SURGERY — LEFT HEART CATH AND CORONARY ANGIOGRAPHY
Anesthesia: LOCAL

## 2017-08-06 MED ORDER — IOPAMIDOL (ISOVUE-370) INJECTION 76%
INTRAVENOUS | Status: AC
  Filled 2017-08-06: qty 50

## 2017-08-06 MED ORDER — SODIUM CHLORIDE 0.9% FLUSH: 3.0000 mL | INTRAVENOUS | Status: DC | PRN

## 2017-08-06 MED ORDER — ASPIRIN 81 MG PO CHEW
81.0000 mg | CHEWABLE_TABLET | Freq: Every day | ORAL | Status: DC
  Administered 2017-08-07 – 2017-08-08 (×2): 81 mg via ORAL
  Filled 2017-08-06 (×2): qty 1

## 2017-08-06 MED ORDER — VERAPAMIL HCL 2.5 MG/ML IV SOLN
INTRAVENOUS | Status: AC
  Filled 2017-08-06: qty 2

## 2017-08-06 MED ORDER — TICAGRELOR 90 MG PO TABS: 90.0000 mg | ORAL_TABLET | Freq: Two times a day (BID) | ORAL | Status: DC

## 2017-08-06 MED ORDER — LIDOCAINE HCL (PF) 1 % IJ SOLN
INTRAMUSCULAR | Status: AC
  Filled 2017-08-06: qty 30

## 2017-08-06 MED ORDER — LIDOCAINE HCL (PF) 1 % IJ SOLN
INTRAMUSCULAR | Status: DC | PRN
  Administered 2017-08-06: 2 mL

## 2017-08-06 MED ORDER — IOPAMIDOL (ISOVUE-370) INJECTION 76%
INTRAVENOUS | Status: AC
  Filled 2017-08-06: qty 100

## 2017-08-06 MED ORDER — HEPARIN SODIUM (PORCINE) 1000 UNIT/ML IJ SOLN
INTRAMUSCULAR | Status: DC | PRN
  Administered 2017-08-06: 3500 [IU] via INTRAVENOUS

## 2017-08-06 MED ORDER — TICAGRELOR 90 MG PO TABS
ORAL_TABLET | ORAL | Status: AC
  Filled 2017-08-06: qty 2

## 2017-08-06 MED ORDER — NITROGLYCERIN 1 MG/10 ML FOR IR/CATH LAB
INTRA_ARTERIAL | Status: DC | PRN
  Administered 2017-08-06 (×2): 200 ug via INTRACORONARY

## 2017-08-06 MED ORDER — BIVALIRUDIN TRIFLUOROACETATE 250 MG IV SOLR
INTRAVENOUS | Status: AC
  Filled 2017-08-06: qty 250

## 2017-08-06 MED ORDER — LABETALOL HCL 5 MG/ML IV SOLN: 10.0000 mg | INTRAVENOUS | Status: AC | PRN

## 2017-08-06 MED ORDER — BIVALIRUDIN BOLUS VIA INFUSION - CUPID
INTRAVENOUS | Status: DC | PRN
  Administered 2017-08-06: 48.9 mg via INTRAVENOUS

## 2017-08-06 MED ORDER — HEPARIN SODIUM (PORCINE) 1000 UNIT/ML IJ SOLN
INTRAMUSCULAR | Status: AC
  Filled 2017-08-06: qty 1

## 2017-08-06 MED ORDER — HYDRALAZINE HCL 20 MG/ML IJ SOLN: 5.0000 mg | INTRAMUSCULAR | Status: AC | PRN

## 2017-08-06 MED ORDER — TICAGRELOR 90 MG PO TABS
ORAL_TABLET | ORAL | Status: DC | PRN
  Administered 2017-08-06: 180 mg via ORAL

## 2017-08-06 MED ORDER — HEPARIN (PORCINE) IN NACL 1000-0.9 UT/500ML-% IV SOLN
INTRAVENOUS | Status: DC | PRN
  Administered 2017-08-06: 500 mL

## 2017-08-06 MED ORDER — VERAPAMIL HCL 2.5 MG/ML IV SOLN
INTRAVENOUS | Status: DC | PRN
  Administered 2017-08-06 (×2): 200 ug via INTRACORONARY

## 2017-08-06 MED ORDER — SODIUM CHLORIDE 0.9 % IV SOLN
INTRAVENOUS | Status: DC
  Administered 2017-08-06: 20:00:00 via INTRAVENOUS

## 2017-08-06 MED ORDER — TICAGRELOR 90 MG PO TABS
90.0000 mg | ORAL_TABLET | Freq: Two times a day (BID) | ORAL | Status: DC
  Administered 2017-08-07 – 2017-08-08 (×3): 90 mg via ORAL
  Filled 2017-08-06 (×4): qty 1

## 2017-08-06 MED ORDER — SODIUM CHLORIDE 0.9% FLUSH
3.0000 mL | Freq: Two times a day (BID) | INTRAVENOUS | Status: DC
  Administered 2017-08-07 – 2017-08-08 (×3): 3 mL via INTRAVENOUS

## 2017-08-06 MED ORDER — ONDANSETRON HCL 4 MG/2ML IJ SOLN: 4.0000 mg | Freq: Four times a day (QID) | INTRAMUSCULAR | Status: DC | PRN

## 2017-08-06 MED ORDER — SODIUM CHLORIDE 0.9 % IV SOLN
INTRAVENOUS | Status: AC | PRN
  Administered 2017-08-06: 1.75 mg/kg/h via INTRAVENOUS

## 2017-08-06 MED ORDER — ACETAMINOPHEN 325 MG PO TABS: 650.0000 mg | ORAL_TABLET | ORAL | Status: DC | PRN

## 2017-08-06 MED ORDER — VERAPAMIL HCL 2.5 MG/ML IV SOLN
INTRAVENOUS | Status: DC | PRN
  Administered 2017-08-06: 16:00:00 via INTRA_ARTERIAL

## 2017-08-06 MED ORDER — NITROGLYCERIN 1 MG/10 ML FOR IR/CATH LAB
INTRA_ARTERIAL | Status: AC
  Filled 2017-08-06: qty 10

## 2017-08-06 MED ORDER — HEPARIN (PORCINE) IN NACL 1000-0.9 UT/500ML-% IV SOLN
INTRAVENOUS | Status: AC
  Filled 2017-08-06: qty 1000

## 2017-08-06 MED ORDER — SODIUM CHLORIDE 0.9 % IV SOLN: 250.0000 mL | INTRAVENOUS | Status: DC | PRN

## 2017-08-06 MED ORDER — IOPAMIDOL (ISOVUE-370) INJECTION 76%
INTRAVENOUS | Status: DC | PRN
  Administered 2017-08-06: 95 mL via INTRA_ARTERIAL

## 2017-08-06 SURGICAL SUPPLY — 16 items
BALLN SAPPHIRE 2.0X15 (BALLOONS) ×2
BALLOON SAPPHIRE 2.0X15 (BALLOONS) IMPLANT
CATH 5FR JL3.5 JR4 ANG PIG MP (CATHETERS) ×1 IMPLANT
CATH LAUNCHER 6FR EBU 3 (CATHETERS) ×1 IMPLANT
DEVICE RAD COMP TR BAND LRG (VASCULAR PRODUCTS) ×1 IMPLANT
GLIDESHEATH SLEND SS 6F .021 (SHEATH) ×1 IMPLANT
GUIDEWIRE INQWIRE 1.5J.035X260 (WIRE) IMPLANT
INQWIRE 1.5J .035X260CM (WIRE) ×2
KIT ENCORE 26 ADVANTAGE (KITS) ×1 IMPLANT
KIT HEART LEFT (KITS) ×2 IMPLANT
KIT HEMO VALVE WATCHDOG (MISCELLANEOUS) ×1 IMPLANT
PACK CARDIAC CATHETERIZATION (CUSTOM PROCEDURE TRAY) ×2 IMPLANT
STENT SYNERGY DES 2.25X20 (Permanent Stent) ×1 IMPLANT
TRANSDUCER W/STOPCOCK (MISCELLANEOUS) ×2 IMPLANT
TUBING CIL FLEX 10 FLL-RA (TUBING) ×2 IMPLANT
WIRE ASAHI PROWATER 180CM (WIRE) ×1 IMPLANT

## 2017-08-06 NOTE — Progress Notes (Signed)
   Progress Note  Patient Name: [name] Date of Encounter: 08/06/2017  Primary Cardiologist: Dr Desmond Dike w/ pt and his sister in the room.   Thoroughly reviewed his issues with the cath.  Main concerns are: 1) being in the hospital - His wife was admitted after a fall some months ago and died several days later. This is causing anxiety.  2) anxiety about the procedure from a renal standpoint - advised he is getting hydration and we will try to minimize dye use to keep his kidneys ok  3) anxiety about other risks from the procedure - He is concerned that his Parkinson's and other medical issues make him a poor cath candidate. - I reminded him that he is functional at baseline, going to exercise classes regularly and taking care of his needs.  - His diabetes is not severe and his blood sugars have not been over 200 this admission. - I do not feel his functional status or his other medical conditions make him a poor cath candidate.    [name] decided to have the cath. I informed the nurse and the cath lab.   Rosaria Ferries, PA-C 08/06/2017 2:16 PM Beeper [phone]

## 2017-08-06 NOTE — Interval H&P Note (Signed)
Cath Lab Visit (complete for each Cath Lab visit)  Clinical Evaluation Leading to the Procedure:   ACS: Yes.    Non-ACS:    Anginal Classification: CCS IV  Anti-ischemic medical therapy: Minimal Therapy (1 class of medications)  Non-Invasive Test Results: intermediate stress test  Prior CABG: No previous CABG      History and Physical Interval Note:  08/06/2017 3:39 PM  [name]  has presented today for surgery, with the diagnosis of Non-Stemi  The various methods of treatment have been discussed with the patient and family. After consideration of risks, benefits and other options for treatment, the patient has consented to  Procedure(s): LEFT HEART CATH AND CORONARY ANGIOGRAPHY (N/A) as a surgical intervention .  The patient's history has been reviewed, patient examined, no change in status, stable for surgery.  I have reviewed the patient's chart and labs.  Questions were answered to the patient's satisfaction.     Larae Grooms

## 2017-08-06 NOTE — Progress Notes (Signed)
Patient returned from cath lab

## 2017-08-06 NOTE — Progress Notes (Addendum)
[facility] PROGRESS NOTE  MONTRAE BRAITHWAITE [id number] DOB: Nov 14, 1931 DOA: 08/03/2017  PCP: Lajean Manes, MD  Brief History/Interval Summary: 84 year old Caucasian male with past medical history of stroke, chronic kidney disease, hypertension, dyslipidemia, diabetes mellitus type 2, history of prostate cancer and hypothyroidism presented with chest pain.  Patient was found to have mild elevation in troponin.  No previous history of heart disease.  Hospitalized for further management.  Reason for Visit: NSTEMI  Consultants: Cardiology  Procedures:  Transthoracic echocardiogram Study Conclusions  - Left ventricle: The cavity size was normal. There was mild focal   basal hypertrophy of the septum. Systolic function was normal.   The estimated ejection fraction was in the range of 55% to 60%.   Wall motion was normal; there were no regional wall motion   abnormalities. Features are consistent with a pseudonormal left   ventricular filling pattern, with concomitant abnormal relaxation   and increased filling pressure (grade 2 diastolic dysfunction). - Mitral valve: Calcified annulus. Mildly thickened, mildly   calcified leaflets . Mild diffuse thickening of the anterior   leaflet, consistent with rheumatic disease. - Left atrium: The atrium was moderately dilated. - Atrial septum: No defect or patent foramen ovale was identified.  Nuclear stress test  There was no ST segment deviation noted during stress.  Findings consistent with ischemia.  The left ventricular ejection fraction is normal (55-65%).  This is an intermediate risk study. Thinning of the inferior wall Moderate area of lateral wall ischemia at mid and basal level EF 58%  Antibiotics: None  Subjective/Interval History: Patient denies any complaints this morning.  States that his back pain is better.  Denies any chest pain or shortness of breath this morning.    ROS: Denies any nausea or  vomiting  Objective:  Vital Signs  Vitals:   08/05/17 1941 08/05/17 2257 08/06/17 0339 08/06/17 0818  BP: (!) 94/58 138/83 130/80 (!) 142/93  Pulse: 92 75 75 80  Resp: 19 19 20  (!) 21  Temp: (!) 97.3 F (36.3 C) (!) 97.5 F (36.4 C) 97.7 F (36.5 C) 98.2 F (36.8 C)  TempSrc: Oral Oral Oral Oral  SpO2: 99% 98% 99% 96%  Weight:      Height:        Intake/Output Summary (Last 24 hours) at 08/06/2017 0855 Last data filed at 08/06/2017 0700 Gross per 24 hour  Intake 546.36 ml  Output 600 ml  Net -53.64 ml   Filed Weights   08/03/17 1649 08/04/17 0058 08/05/17 0500  Weight: 72.6 kg (160 lb) 73 kg (160 lb 14.4 oz) 65.2 kg (143 lb 12.8 oz)    General appearance: Awake alert.  In no distress. Resp: Clear to auscultation bilaterally.  No wheezing rales or rhonchi Cardio: S1-S2 is normal regular.  Systolic murmur appreciated over the precordium GI: Abdomen is soft.  Nontender nondistended Extremities: No edema Neurologic: No focal neurological deficits.  Lab Results:  Data Reviewed: I have personally reviewed following labs and imaging studies  CBC: Recent Labs  Lab 08/03/17 1654 08/05/17 0306 08/06/17 0308  WBC 5.5 5.6 6.3  HGB 12.3* 13.2 12.7*  HCT 39.0 41.4 39.2  MCV 92.0 88.7 88.7  PLT 190 195 564    Basic Metabolic Panel: Recent Labs  Lab 08/03/17 1654 08/05/17 0306 08/06/17 0308  NA 140 139 139  K 4.3 3.8 3.6  CL 103 106 105  CO2 27 25 25   GLUCOSE 108* 96 95  BUN 25* 20 27*  CREATININE  1.44* 1.26* 1.43*  CALCIUM 9.2 9.3 8.9    GFR: Estimated Creatinine Clearance: 36.1 mL/min (A) (by C-G formula based on SCr of 1.43 mg/dL (H)).  Cardiac Enzymes: Recent Labs  Lab 08/03/17 2014 08/03/17 2326 08/04/17 0320 08/04/17 0855 08/04/17 1512  TROPONINI 0.17* 0.18* 0.16* 0.12* 0.11*    HbA1C: Recent Labs    08/04/17 0320  HGBA1C 6.3*    CBG: Recent Labs  Lab 08/05/17 0725 08/05/17 1222 08/05/17 1720 08/05/17 2138 08/06/17 0828   GLUCAP 103* 179* 110* 151* 150*    Lipid Profile: Recent Labs    08/04/17 0320  CHOL 157  HDL 40*  LDLCALC 106*  TRIG 57  CHOLHDL 3.9     Recent Results (from the past 240 hour(s))  MRSA PCR Screening     Status: None   Collection Time: 08/04/17  1:00 AM  Result Value Ref Range Status   MRSA by PCR NEGATIVE NEGATIVE Final    Comment:        The GeneXpert MRSA Assay (FDA approved for NASAL specimens only), is one component of a comprehensive MRSA colonization surveillance program. It is not intended to diagnose MRSA infection nor to guide or monitor treatment for MRSA infections. Performed at [facility], [place] [address], [place], [place] [address]       Radiology Studies: Nm Myocar Multi W/spect W/wall Motion / Ef  Result Date: 08/05/2017  There was no ST segment deviation noted during stress.  Findings consistent with ischemia.  The left ventricular ejection fraction is normal (55-65%).  This is an intermediate risk study.  Thinning of the inferior wall Moderate area of lateral wall ischemia at mid and basal level EF 58%     Medications:  Scheduled: . alfuzosin  10 mg Oral QHS  . aspirin EC  325 mg Oral Daily  . atropine  1 drop Right Eye TID  . brimonidine  1 drop Both Eyes BID   And  . timolol  1 drop Both Eyes BID  . brinzolamide  1 drop Right Eye TID  . carbidopa-levodopa  1 tablet Oral TID WC  . cholecalciferol  5,000 Units Oral Daily  . colesevelam  1,875 mg Oral Q breakfast  . diltiazem  120 mg Oral Daily  . diphenhydrAMINE  50 mg Oral Once   Or  . diphenhydrAMINE  50 mg Intravenous Once  . docusate sodium  100 mg Oral Daily  . enoxaparin (LOVENOX) injection  40 mg Subcutaneous Daily  . gabapentin  100 mg Oral QHS  . hydroxypropyl methylcellulose / hypromellose  1 drop Both Eyes TID AC & HS  . insulin aspart  0-5 Units Subcutaneous QHS  . insulin aspart  0-9 Units Subcutaneous TID WC  . latanoprost  1 drop Right Eye QHS  .  levothyroxine  200 mcg Oral QAC breakfast  . loratadine  10 mg Oral Daily  . multivitamin  1 tablet Oral Daily  . pantoprazole  40 mg Oral Daily  . polyethylene glycol  17 g Oral Daily  . predniSONE  50 mg Oral Q6H  . sodium chloride flush  3 mL Intravenous Q12H  . URELLE  1 tablet Oral TID WC   Continuous: . sodium chloride    . sodium chloride 1 mL/kg/hr (08/05/17 2314)   UEA:VWUJWJ chloride, acetaminophen, albuterol, guaiFENesin, hydrALAZINE, ondansetron (ZOFRAN) IV, sodium chloride flush, traMADol, zolpidem   Assessment/Plan:   NSTEMI EKG did not show any ischemic changes.  Chest x-ray did not show any  acute findings.  Troponins were minimally elevated.  Cardiology was consulted.  Aspirin was continued.  Patient without any previous history of coronary artery disease.  Echocardiogram showed no wall motion abnormality, normal EF, grade 2 diastolic dysfunction.  Patient underwent stress test which has been reported as being intermediate risk with ischemia in the lateral wall.  Patient has not had any further chest pain.  Plan is for cardiac catheterization today.   Getting premedicated with prednisone due to history of contrast allergy.  Hypothyroidism Continue home medications.  Dyslipidemia Patient allergic to statin.  Is noted to be on WelChol.  LDL 106.  Chronic diastolic CHF Grade 2 diastolic dysfunction noted on echocardiogram.  Patient appears to be euvolemic currently.  History of stroke Stable.  Noted to be on aspirin.    Essential hypertension Continue home medications.  Monitor blood pressures closely.  Furosemide is on hold.  Chronic kidney disease stage III Baseline creatinine around 1.4-1.5.  Seems to be at baseline.  Monitor closely.  Monitor urine output.  Diabetes mellitus type 2 with renal manifestations HbA1c 6.3.  CBGs are reasonably well controlled.  Continue SSI.  GERD PPI  DVT Prophylaxis: Lovenox    Code Status: DNR Family Communication:  Discussed with patient Disposition Plan: Await cardiac catheterization this afternoon.    LOS: 0 days   [facility] Pager [phone] 08/06/2017, 8:55 AM  If 7PM-7AM, please contact night-coverage at [email or web address], password [facility]

## 2017-08-07 DIAGNOSIS — N189 Chronic kidney disease, unspecified: Secondary | ICD-10-CM

## 2017-08-07 DIAGNOSIS — N183 Chronic kidney disease, stage 3 (moderate): Secondary | ICD-10-CM

## 2017-08-07 DIAGNOSIS — N179 Acute kidney failure, unspecified: Secondary | ICD-10-CM

## 2017-08-07 DIAGNOSIS — I2510 Atherosclerotic heart disease of native coronary artery without angina pectoris: Secondary | ICD-10-CM

## 2017-08-07 DIAGNOSIS — I2583 Coronary atherosclerosis due to lipid rich plaque: Secondary | ICD-10-CM

## 2017-08-07 DIAGNOSIS — I214 Non-ST elevation (NSTEMI) myocardial infarction: Principal | ICD-10-CM

## 2017-08-07 LAB — BASIC METABOLIC PANEL
ANION GAP: 9 (ref 5–15)
ANION GAP: 8 (ref 5–15)
BUN: 36 mg/dL — ABNORMAL HIGH (ref 8–23)
BUN: 39 mg/dL — AB (ref 8–23)
CALCIUM: 8.6 mg/dL — AB (ref 8.9–10.3)
CO2: 21 mmol/L — AB (ref 22–32)
CO2: 23 mmol/L (ref 22–32)
Calcium: 8.7 mg/dL — ABNORMAL LOW (ref 8.9–10.3)
Chloride: 107 mmol/L (ref 98–111)
Chloride: 106 mmol/L (ref 98–111)
Creatinine, Ser: 1.52 mg/dL — ABNORMAL HIGH (ref 0.61–1.24)
Creatinine, Ser: 1.62 mg/dL — ABNORMAL HIGH (ref 0.61–1.24)
GFR calc Af Amer: 47 mL/min — ABNORMAL LOW (ref >60)
GFR calc Af Amer: 44 mL/min — ABNORMAL LOW (ref >60)
GFR calc non Af Amer: 38 mL/min — ABNORMAL LOW (ref >60)
GFR, EST NON AFRICAN AMERICAN: 41 mL/min — AB (ref >60)
GLUCOSE: 178 mg/dL — AB (ref 70–99)
Glucose, Bld: 169 mg/dL — ABNORMAL HIGH (ref 70–99)
POTASSIUM: 3.7 mmol/L (ref 3.5–5.1)
Potassium: 3.8 mmol/L (ref 3.5–5.1)
Sodium: 137 mmol/L (ref 135–145)
Sodium: 137 mmol/L (ref 135–145)

## 2017-08-07 LAB — GLUCOSE, CAPILLARY
GLUCOSE-CAPILLARY: 134 mg/dL — AB (ref 70–99)
Glucose-Capillary: 136 mg/dL — ABNORMAL HIGH (ref 70–99)
Glucose-Capillary: 163 mg/dL — ABNORMAL HIGH (ref 70–99)
Glucose-Capillary: 143 mg/dL — ABNORMAL HIGH (ref 70–99)

## 2017-08-07 LAB — CBC
HEMATOCRIT: 37.2 % — AB (ref 39.0–52.0)
HEMOGLOBIN: 12.3 g/dL — AB (ref 13.0–17.0)
MCH: 29.1 pg (ref 26.0–34.0)
MCHC: 33.1 g/dL (ref 30.0–36.0)
MCV: 87.9 fL (ref 78.0–100.0)
PLATELETS: 214 10*3/uL (ref 150–400)
RBC: 4.23 MIL/uL (ref 4.22–5.81)
RDW: 12.6 % (ref 11.5–15.5)
WBC: 9.9 10*3/uL (ref 4.0–10.5)

## 2017-08-07 MED ORDER — METOPROLOL SUCCINATE ER 25 MG PO TB24
25.0000 mg | ORAL_TABLET | Freq: Every day | ORAL | Status: DC
  Administered 2017-08-08: 25 mg via ORAL
  Filled 2017-08-07: qty 1

## 2017-08-07 MED ORDER — SODIUM CHLORIDE 0.9 % IV BOLUS
250.0000 mL | Freq: Once | INTRAVENOUS | Status: AC
  Administered 2017-08-07: 250 mL via INTRAVENOUS

## 2017-08-07 MED ORDER — SODIUM CHLORIDE 0.9 % IV SOLN
INTRAVENOUS | Status: DC
  Administered 2017-08-07 (×2): via INTRAVENOUS

## 2017-08-07 NOTE — Progress Notes (Signed)
CARDIAC REHAB PHASE I   PRE:  Rate/Rhythm: 88 SR with PVCs    BP: sitting 138/60    SaO2: 94 RA  MODE:  Ambulation: 150 ft   POST:  Rate/Rhythm: 121 ST    BP: sitting 150/83     SaO2: 97 RA  Pt able to move to EOB and stand independently but slow. Steady walk with RW, just slow. Short gait due to Parkinson per pt. He rides stationary bike at ALF. He uses cane in apartment but has caregivers periodically. No CP, HR elevated with walking. To recliner after walk. Ed completed. He understands Brilinta importance. He needs Brilinta card. Will refer to [place] however he is not interested in program. He cannot take NTG. Encouraged him to get back to his exercise routine slowly. [id number]   [place], ACSM 08/07/2017 11:11 AM

## 2017-08-07 NOTE — Progress Notes (Signed)
[facility] PROGRESS NOTE  [name] KGY:[id number] DOB: [birth year] DOA: 08/03/2017  PCP: Lajean Manes, MD  Brief History/Interval Summary: 84 year old Caucasian male with past medical history of stroke, chronic kidney disease, hypertension, dyslipidemia, diabetes mellitus type 2, history of prostate cancer and hypothyroidism presented with chest pain.  Patient was found to have mild elevation in troponin.  No previous history of heart disease.  Hospitalized for further management.  Reason for Visit: NSTEMI  Consultants: Cardiology  Procedures:  Transthoracic echocardiogram Study Conclusions  - Left ventricle: The cavity size was normal. There was mild focal   basal hypertrophy of the septum. Systolic function was normal.   The estimated ejection fraction was in the range of 55% to 60%.   Wall motion was normal; there were no regional wall motion   abnormalities. Features are consistent with a pseudonormal left   ventricular filling pattern, with concomitant abnormal relaxation   and increased filling pressure (grade 2 diastolic dysfunction). - Mitral valve: Calcified annulus. Mildly thickened, mildly   calcified leaflets . Mild diffuse thickening of the anterior   leaflet, consistent with rheumatic disease. - Left atrium: The atrium was moderately dilated. - Atrial septum: No defect or patent foramen ovale was identified.  Nuclear stress test  There was no ST segment deviation noted during stress.  Findings consistent with ischemia.  The left ventricular ejection fraction is normal (55-65%).  This is an intermediate risk study. Thinning of the inferior wall Moderate area of lateral wall ischemia at mid and basal level EF 58%  Cardiac catheterization    Mid RCA to Dist RCA lesion is 25% stenosed.  Ost 1st Mrg to 1st Mrg lesion is 70% stenosed.  Mid Cx lesion is 80% stenosed.  A drug-eluting stent was successfully placed using a STENT SYNERGY DES  2.25X20.  Post intervention, there is a 0% residual stenosis.  Mid LAD lesion is 25% stenosed.  LV end diastolic pressure is normal.  There is no aortic valve stenosis.    Recommend uninterrupted dual antiplatelet therapy with Aspirin 81mg  daily and Ticagrelor 90mg  twice daily for a minimum of 12 months (ACS - Class I recommendation).   Continue aggressive secondary prevention.     Antibiotics: None  Subjective/Interval History: Patient apparently got a little confused overnight.  Without any issues this morning.  Denies any chest pain shortness of breath.    ROS: Denies any nausea or vomiting  Objective:  Vital Signs  Vitals:   08/07/17 0940 08/07/17 1000 08/07/17 1100 08/07/17 1200  BP: 123/66 138/60 134/69   Pulse:      Resp:  18 15   Temp:    98.2 F (36.8 C)  TempSrc:    Oral  SpO2:   99%   Weight:      Height:        Intake/Output Summary (Last 24 hours) at 08/07/2017 1237 Last data filed at 08/07/2017 1226 Gross per 24 hour  Intake 988.36 ml  Output 676 ml  Net 312.36 ml   Filed Weights   08/03/17 1649 08/04/17 0058 08/05/17 0500  Weight: 72.6 kg (160 lb) 73 kg (160 lb 14.4 oz) 65.2 kg (143 lb 12.8 oz)    General appearance: Awake alert.  In no distress Resp: Clear to auscultation bilaterally Cardio: S1-S2 normal regular.  Systolic murmur appreciated over the precordium GI: Abdomen soft.  Nontender nondistended Extremities: No edema Neurologic: No focal neurological deficits.  Lab Results:  Data Reviewed: I have personally reviewed following labs and  imaging studies  CBC: Recent Labs  Lab 08/03/17 1654 08/05/17 0306 08/06/17 0308 08/07/17 0226  WBC 5.5 5.6 6.3 9.9  HGB 12.3* 13.2 12.7* 12.3*  HCT 39.0 41.4 39.2 37.2*  MCV 92.0 88.7 88.7 87.9  PLT 190 195 199 536    Basic Metabolic Panel: Recent Labs  Lab 08/03/17 1654 08/05/17 0306 08/06/17 0308 08/07/17 0226 08/07/17 1132  NA 140 139 139 137 137  K 4.3 3.8 3.6 3.8 3.7    CL 103 106 105 107 106  CO2 27 25 25  21* 23  GLUCOSE 108* 96 95 178* 169*  BUN 25* 20 27* 36* 39*  CREATININE 1.44* 1.26* 1.43* 1.52* 1.62*  CALCIUM 9.2 9.3 8.9 8.7* 8.6*    GFR: Estimated Creatinine Clearance: 31.9 mL/min (A) (by C-G formula based on SCr of 1.62 mg/dL (H)).  Cardiac Enzymes: Recent Labs  Lab 08/03/17 2014 08/03/17 2326 08/04/17 0320 08/04/17 0855 08/04/17 1512  TROPONINI 0.17* 0.18* 0.16* 0.12* 0.11*    HbA1C: No results for input(s): HGBA1C in the last 72 hours.  CBG: Recent Labs  Lab 08/06/17 1155 08/06/17 1741 08/06/17 2204 08/07/17 0812 08/07/17 1231  GLUCAP 192* 162* 213* 134* 136*     Recent Results (from the past 240 hour(s))  MRSA PCR Screening     Status: None   Collection Time: 08/04/17  1:00 AM  Result Value Ref Range Status   MRSA by PCR NEGATIVE NEGATIVE Final    Comment:        The GeneXpert MRSA Assay (FDA approved for NASAL specimens only), is one component of a comprehensive MRSA colonization surveillance program. It is not intended to diagnose MRSA infection nor to guide or monitor treatment for MRSA infections. Performed at [facility], [place] [address], [place], [place] [address]       Radiology Studies: No results found.   Medications:  Scheduled: . alfuzosin  10 mg Oral QHS  . aspirin  81 mg Oral Daily  . atropine  1 drop Right Eye TID  . brimonidine  1 drop Both Eyes BID   And  . timolol  1 drop Both Eyes BID  . brinzolamide  1 drop Right Eye TID  . carbidopa-levodopa  1 tablet Oral TID WC  . cholecalciferol  5,000 Units Oral Daily  . colesevelam  1,875 mg Oral Q breakfast  . docusate sodium  100 mg Oral Daily  . enoxaparin (LOVENOX) injection  40 mg Subcutaneous Daily  . gabapentin  100 mg Oral QHS  . hydroxypropyl methylcellulose / hypromellose  1 drop Both Eyes TID AC & HS  . insulin aspart  0-5 Units Subcutaneous QHS  . insulin aspart  0-9 Units Subcutaneous TID WC  . latanoprost  1 drop  Right Eye QHS  . levothyroxine  200 mcg Oral QAC breakfast  . loratadine  10 mg Oral Daily  . [START ON 08/08/2017] metoprolol succinate  25 mg Oral Daily  . multivitamin  1 tablet Oral Daily  . pantoprazole  40 mg Oral Daily  . polyethylene glycol  17 g Oral Daily  . sodium chloride flush  3 mL Intravenous Q12H  . ticagrelor  90 mg Oral BID   Continuous: . sodium chloride    . sodium chloride    . sodium chloride     VQM:GQQPYP chloride, acetaminophen, albuterol, guaiFENesin, ondansetron (ZOFRAN) IV, sodium chloride flush, traMADol   Assessment/Plan:   NSTEMI EKG did not show any ischemic changes.  Chest x-ray did not  show any acute findings.  Troponins were minimally elevated.  Cardiology was consulted.  Aspirin was continued.  Patient without any previous history of coronary artery disease.  Echocardiogram showed no wall motion abnormality, normal EF, grade 2 diastolic dysfunction.  Patient underwent stress test which has been reported as being intermediate risk with ischemia in the lateral wall.  Patient underwent cardiac catheterization which did show disease in the circumflex which was stented.  On dual antiplatelet treatment. He was started on beta-blockers.  Cardizem was stopped.  No ACE inhibitor due to chronic kidney disease.    Acute on chronic kidney disease stage III Baseline creatinine around 1.4-1.5.  Creatinine is noted to be higher today compared to yesterday.  He did get contrast with the cardiac catheterization.  Creatinine was repeated and continues to trend higher.  Patient will be started on IV fluids.  Monitor urine output.  Recheck labs tomorrow morning.    Hypothyroidism Continue home medications.  Stable  Dyslipidemia Patient allergic to statin. Noted to be on WelChol.  LDL 106.  Chronic diastolic CHF Grade 2 diastolic dysfunction noted on echocardiogram.  No evidence for volume overload.  History of stroke Stable.  Noted to be on aspirin.    Essential  hypertension Continue home medications.  Monitor blood pressures closely.  Furosemide is on hold.  Diabetes mellitus type 2 with renal manifestations HbA1c 6.3.  CBGs are reasonably well controlled.  Continue SSI.  GERD PPI  History of Parkinson's disease Continue home medications.  DVT Prophylaxis: Lovenox    Code Status: DNR Family Communication: Discussed with patient Disposition Plan: From cardiac standpoint however his creatinine is climbing.  He will need IV fluids.  Not ready for discharge due to the same.    LOS: 1 day   [facility] Pager [phone] 08/07/2017, 12:37 PM  If 7PM-7AM, please contact night-coverage at [email or web address], password [facility]

## 2017-08-07 NOTE — Progress Notes (Signed)
Progress Note  Patient Name: [name] Date of Encounter: 08/07/2017  Primary Cardiologist: No primary care provider on file.   Subjective   S/p cath showing mid to distal RCA 25%, 70% OM1, 80% mid LCx s/p PCI of mid LCx.  Denies any CP or SOB. Creatinine bumped slightly this am (1.43>1.52)  Inpatient Medications    Scheduled Meds: . alfuzosin  10 mg Oral QHS  . aspirin  81 mg Oral Daily  . atropine  1 drop Right Eye TID  . brimonidine  1 drop Both Eyes BID   And  . timolol  1 drop Both Eyes BID  . brinzolamide  1 drop Right Eye TID  . carbidopa-levodopa  1 tablet Oral TID WC  . cholecalciferol  5,000 Units Oral Daily  . colesevelam  1,875 mg Oral Q breakfast  . diltiazem  120 mg Oral Daily  . docusate sodium  100 mg Oral Daily  . enoxaparin (LOVENOX) injection  40 mg Subcutaneous Daily  . gabapentin  100 mg Oral QHS  . hydroxypropyl methylcellulose / hypromellose  1 drop Both Eyes TID AC & HS  . insulin aspart  0-5 Units Subcutaneous QHS  . insulin aspart  0-9 Units Subcutaneous TID WC  . latanoprost  1 drop Right Eye QHS  . levothyroxine  200 mcg Oral QAC breakfast  . loratadine  10 mg Oral Daily  . multivitamin  1 tablet Oral Daily  . pantoprazole  40 mg Oral Daily  . polyethylene glycol  17 g Oral Daily  . sodium chloride flush  3 mL Intravenous Q12H  . ticagrelor  90 mg Oral BID   Continuous Infusions: . sodium chloride     PRN Meds: sodium chloride, acetaminophen, albuterol, guaiFENesin, ondansetron (ZOFRAN) IV, sodium chloride flush, traMADol, zolpidem   Vital Signs    Vitals:   08/07/17 0600 08/07/17 0700 08/07/17 0810 08/07/17 0940  BP: (!) 144/73 (!) 147/85  123/66  Pulse:      Resp: (!) 23 19    Temp:   98.1 F (36.7 C)   TempSrc:   Oral   SpO2: 98%     Weight:      Height:        Intake/Output Summary (Last 24 hours) at 08/07/2017 1034 Last data filed at 08/07/2017 0919 Gross per 24 hour  Intake 988.36 ml  Output 551 ml  Net 437.36  ml   Filed Weights   08/03/17 1649 08/04/17 0058 08/05/17 0500  Weight: 160 lb (72.6 kg) 160 lb 14.4 oz (73 kg) 143 lb 12.8 oz (65.2 kg)    Telemetry    NSR - Personally Reviewed  ECG    NSR with PACs and PVCs - Personally Reviewed  Physical Exam   GEN: No acute distress.   Neck: No JVD Cardiac: RRR, no murmurs, rubs, or gallops.  Respiratory: Clear to auscultation bilaterally. GI: Soft, nontender, non-distended  MS: No edema; No deformity. Neuro:  Nonfocal  Psych: Normal affect   Labs    Chemistry Recent Labs  Lab 08/05/17 0306 08/06/17 0308 08/07/17 0226  NA 139 139 137  K 3.8 3.6 3.8  CL 106 105 107  CO2 25 25 21*  GLUCOSE 96 95 178*  BUN 20 27* 36*  CREATININE 1.26* 1.43* 1.52*  CALCIUM 9.3 8.9 8.7*  GFRNONAA 51* 44* 41*  GFRAA 59* 51* 47*  ANIONGAP 8 9 9      Hematology Recent Labs  Lab 08/05/17 7654 08/06/17 0308 08/07/17 6503  WBC 5.6 6.3 9.9  RBC 4.67 4.42 4.23  HGB 13.2 12.7* 12.3*  HCT 41.4 39.2 37.2*  MCV 88.7 88.7 87.9  MCH 28.3 28.7 29.1  MCHC 31.9 32.4 33.1  RDW 12.3 12.2 12.6  PLT 195 199 214    Cardiac Enzymes Recent Labs  Lab 08/03/17 2326 08/04/17 0320 08/04/17 0855 08/04/17 1512  TROPONINI 0.18* 0.16* 0.12* 0.11*    Recent Labs  Lab 08/03/17 1713 08/03/17 2020  TROPIPOC 0.19* 0.16*     BNP Recent Labs  Lab 08/04/17 0320  BNP 431.0*     DDimer No results for input(s): DDIMER in the last 168 hours.   Radiology    Nm Myocar Multi W/spect W/wall Motion / Ef  Result Date: 08/05/2017  There was no ST segment deviation noted during stress.  Findings consistent with ischemia.  The left ventricular ejection fraction is normal (55-65%).  This is an intermediate risk study.  Thinning of the inferior wall Moderate area of lateral wall ischemia at mid and basal level EF 58%    Cardiac Studies   Cardiac cath 08/06/2017 Conclusion     Mid RCA to Dist RCA lesion is 25% stenosed.  Ost 1st Mrg to 1st Mrg  lesion is 70% stenosed.  Mid Cx lesion is 80% stenosed.  A drug-eluting stent was successfully placed using a STENT SYNERGY DES 2.25X20.  Post intervention, there is a 0% residual stenosis.  Mid LAD lesion is 25% stenosed.  LV end diastolic pressure is normal.  There is no aortic valve stenosis.    Recommend uninterrupted dual antiplatelet therapy with Aspirin 81mg  daily and Ticagrelor 90mg  twice daily for a minimum of 12 months (ACS - Class I recommendation).    2D echo Study Conclusions  - Left ventricle: The cavity size was normal. There was mild focal   basal hypertrophy of the septum. Systolic function was normal.   The estimated ejection fraction was in the range of 55% to 60%.   Wall motion was normal; there were no regional wall motion   abnormalities. Features are consistent with a pseudonormal left   ventricular filling pattern, with concomitant abnormal relaxation   and increased filling pressure (grade 2 diastolic dysfunction). - Mitral valve: Calcified annulus. Mildly thickened, mildly   calcified leaflets . Mild diffuse thickening of the anterior   leaflet, consistent with rheumatic disease. - Left atrium: The atrium was moderately dilated. - Atrial septum: No defect or patent foramen ovale was identified.  Patient Profile     84 y.o. male  with no prior cardiac history but multiple cardiac risk factors including type 2DM, HTN, DLD, as well as h/o CKD, CVA, prostate CA and hypothyroidism, who is being seen today for the evaluation of chest pain and elevated troponin, at the request of Dr. Maryland Pink, Internal Medicine.  Assessment & Plan    1. NSTEMI -some atypical features and pain improved after OTC reflux meds were given however his troponins are mildly elevated but with flat trend, 0.17>>0.12>>0.16>>0.12.  -2D echo showed normal LVF with G2DD. -cath with 70% OM and 80% LCx s/p PCI of LCx -continue ASA, Brilinta 90mg  BID -change Cardizem to Toprol XL 25mg   daily -avoid long acting nitrates as he has had significant hypotension with them before  2.  Chronic diastolic CHF -BNP mildly elevated at 431 but this is also in the setting of advanced age and CKD w/ GFR at 43 mL/min.  -He does not appear to be grossly volume overloaded.  -Creatinine bumped  slightly this am to 1.52 and LVEDP was normal at cath -diuretics on hold  3. HTN -BP well controlled at 123/29mmHg today -He is currently on Cardizem 120 mg daily.  -change Cardizem to Toprol XL 25mg  daily  4. Hyperlipidemia -LDL goal < 70 -LDL at 106 mg/dL on admission.  -has history of statin intolerance. -refer to outpt lipid clinic for [id number]  5. DM: controlled. Hgb A1c 6.3. Management per primary team.    [facility] will sign off.   Medication Recommendations:  Toprol XL 25mg  daily, ASA 81mg  daily, Brilinta 90mg  BID Other recommendations (labs, testing, etc):  none Follow up as an outpatient:  followup outpt in 1-2 weeks   For questions or updates, please contact [place] Please consult [email or web address] for contact info under Cardiology/STEMI.      Signed, Fransico Him, MD  08/07/2017, 10:34 AM

## 2017-08-07 NOTE — Progress Notes (Signed)
0600 Pt is confused and agitated. RN and Tech attempted to reorient pt and de-escalate the situation. Pt became combative and removed all of his heart monitor leads and refused to allow staff to put back on pt. Pt is trying to get out off the bed and stated that he will call the police because we are kidnaping him.  0630 MD made aware. 0645 Pt calms down and gets back in the bed with the help of RN and Tech and allows RN and Tech to put monitor leads back on pt. Will continue to monitor pt.

## 2017-08-08 ENCOUNTER — Encounter (HOSPITAL_COMMUNITY): Payer: Self-pay | Admitting: Interventional Cardiology

## 2017-08-08 LAB — GLUCOSE, CAPILLARY
GLUCOSE-CAPILLARY: 116 mg/dL — AB (ref 70–99)
GLUCOSE-CAPILLARY: 188 mg/dL — AB (ref 70–99)

## 2017-08-08 LAB — BASIC METABOLIC PANEL
Anion gap: 7 (ref 5–15)
BUN: 34 mg/dL — AB (ref 8–23)
CHLORIDE: 109 mmol/L (ref 98–111)
CO2: 23 mmol/L (ref 22–32)
Calcium: 8.2 mg/dL — ABNORMAL LOW (ref 8.9–10.3)
Creatinine, Ser: 1.42 mg/dL — ABNORMAL HIGH (ref 0.61–1.24)
GFR, EST AFRICAN AMERICAN: 51 mL/min — AB (ref >60)
GFR, EST NON AFRICAN AMERICAN: 44 mL/min — AB (ref >60)
Glucose, Bld: 113 mg/dL — ABNORMAL HIGH (ref 70–99)
POTASSIUM: 3.5 mmol/L (ref 3.5–5.1)
SODIUM: 139 mmol/L (ref 135–145)

## 2017-08-08 MED ORDER — METOPROLOL SUCCINATE ER 25 MG PO TB24: 25.0000 mg | ORAL_TABLET | Freq: Every day | ORAL | 0 refills | Status: DC

## 2017-08-08 MED ORDER — ZOLPIDEM TARTRATE 5 MG PO TABS: 2.5000 mg | ORAL_TABLET | Freq: Every evening | ORAL | 5 refills | Status: DC | PRN

## 2017-08-08 MED ORDER — TRAMADOL HCL 50 MG PO TABS: 50.0000 mg | ORAL_TABLET | Freq: Four times a day (QID) | ORAL | 0 refills | Status: DC | PRN

## 2017-08-08 MED ORDER — TICAGRELOR 90 MG PO TABS: 90.0000 mg | ORAL_TABLET | Freq: Two times a day (BID) | ORAL | 0 refills | Status: DC

## 2017-08-08 NOTE — Clinical Social Work Note (Signed)
CSW confirmed pt is from [place] and pt can return today.   Loletha Grayer, MSW [phone]

## 2017-08-08 NOTE — Discharge Summary (Signed)
[facility]  Physician Discharge Summary   Patient ID: [name] MRN: [id number] DOB/AGE: November 14, 1931 84 y.o.  Admit date: 08/03/2017 Discharge date: 08/08/2017  PCP: Lajean Manes, MD  DISCHARGE DIAGNOSES:  NSTEMI Coronary artery disease Chronic diastolic CHF Acute on chronic disease stage III, resolved   RECOMMENDATIONS FOR OUTPATIENT FOLLOW UP: 1. Cardiology (Dr. Fransico Him) to schedule outpatient appointment 2. Please call cardiology office if there are any issues with filling the prescription for Brilinta 3. Checking CBC and basic metabolic panel at follow-up. 4. Holding furosemide for now till labs have been checked at follow-up.   DISCHARGE CONDITION: fair  Diet recommendation: Heart healthy/modified carbohydrate  Filed Weights   08/03/17 1649 08/04/17 0058 08/05/17 0500  Weight: 72.6 kg (160 lb) 73 kg (160 lb 14.4 oz) 65.2 kg (143 lb 12.8 oz)    INITIAL HISTORY: 84 year old Caucasian male with past medical history of stroke, chronic kidney disease, hypertension, dyslipidemia, diabetes mellitus type 2, history of prostate cancer and hypothyroidism presented with chest pain.  Patient was found to have mild elevation in troponin.  No previous history of heart disease.  Hospitalized for further management.  Consultants: Cardiology  Procedures:  Transthoracic echocardiogram Study Conclusions  - Left ventricle: The cavity size was normal. There was mild focal basal hypertrophy of the septum. Systolic function was normal. The estimated ejection fraction was in the range of 55% to 60%. Wall motion was normal; there were no regional wall motion abnormalities. Features are consistent with a pseudonormal left ventricular filling pattern, with concomitant abnormal relaxation and increased filling pressure (grade 2 diastolic dysfunction). - Mitral valve: Calcified annulus. Mildly thickened, mildly calcified leaflets . Mild diffuse  thickening of the anterior leaflet, consistent with rheumatic disease. - Left atrium: The atrium was moderately dilated. - Atrial septum: No defect or patent foramen ovale was identified.  Nuclear stress test  There was no ST segment deviation noted during stress.  Findings consistent with ischemia.  The left ventricular ejection fraction is normal (55-65%).  This is an intermediate risk study. Thinning of the inferior wall Moderate area of lateral wall ischemia at mid and basal level EF 58%  Cardiac catheterization    Mid RCA to Dist RCA lesion is 25% stenosed.  Ost 1st Mrg to 1st Mrg lesion is 70% stenosed.  Mid Cx lesion is 80% stenosed.  A drug-eluting stent was successfully placed using a STENT SYNERGY DES 2.25X20.  Post intervention, there is a 0% residual stenosis.  Mid LAD lesion is 25% stenosed.  LV end diastolic pressure is normal.  There is no aortic valve stenosis.   Recommend uninterrupted dual antiplatelet therapy with Aspirin 81mg  daily and Ticagrelor 90mg  twice dailyfor a minimum of 12 months (ACS - Class I recommendation).  Continue aggressive secondary prevention.    HOSPITAL COURSE:    NSTEMI EKG did not show any ischemic changes.  Chest x-ray did not show any acute findings.  Troponins were minimally elevated.  Cardiology was consulted.  Aspirin was continued.  Patient without any previous history of coronary artery disease.  Echocardiogram showed no wall motion abnormality, normal EF, grade 2 diastolic dysfunction.  Patient underwent stress test which has been reported as being intermediate risk with ischemia in the lateral wall.  Patient underwent cardiac catheterization which did show disease in the circumflex which was stented.  On dual antiplatelet treatment with aspirin and Brilinta. He was started on beta-blockers.  Cardizem was stopped.  No ACE inhibitor due to chronic kidney disease.  Outpatient follow-up with  cardiology.  Acute on chronic kidney disease stage III Baseline creatinine around 1.4-1.5.    Creatinine did climb here.  Patient had received contrast for the cardiac catheterization.  He was given IV fluids.  Creatinine improved today.  He has been making urine.  Denies any shortness of breath.     Hypothyroidism Continue home medications.  Stable  Dyslipidemia Patient allergic to statin. Noted to be on WelChol.  LDL 106.  Needs aggressive lifestyle changes.  Chronic diastolic CHF Grade 2 diastolic dysfunction noted on echocardiogram.  No evidence for volume overload.  Hold furosemide till labs have been checked at follow-up.  History of stroke Stable.   Essential hypertension Stable.  Diabetes mellitus type 2 with renal manifestations HbA1c 6.3.    Resume home medications.  GERD PPI  History of Parkinson's disease Continue home medications.  Overall stable.  Patient feels well.  He has been ambulating to the bathroom without difficulty.  Excited about going back to ALF.  Okay for discharge.    PERTINENT LABS:  The results of significant diagnostics from this hospitalization (including imaging, microbiology, ancillary and laboratory) are listed below for reference.    Microbiology: Recent Results (from the past 240 hour(s))  MRSA PCR Screening     Status: None   Collection Time: 08/04/17  1:00 AM  Result Value Ref Range Status   MRSA by PCR NEGATIVE NEGATIVE Final    Comment:        The GeneXpert MRSA Assay (FDA approved for NASAL specimens only), is one component of a comprehensive MRSA colonization surveillance program. It is not intended to diagnose MRSA infection nor to guide or monitor treatment for MRSA infections. Performed at [facility], [place] [address], [place], [place] [address]      Labs: Basic Metabolic Panel: Recent Labs  Lab 08/05/17 0306 08/06/17 0308 08/07/17 0226 08/07/17 1132 08/08/17 0308  NA 139 139 137 137 139   K 3.8 3.6 3.8 3.7 3.5  CL 106 105 107 106 109  CO2 25 25 21* 23 23  GLUCOSE 96 95 178* 169* 113*  BUN 20 27* 36* 39* 34*  CREATININE 1.26* 1.43* 1.52* 1.62* 1.42*  CALCIUM 9.3 8.9 8.7* 8.6* 8.2*   CBC: Recent Labs  Lab 08/03/17 1654 08/05/17 0306 08/06/17 0308 08/07/17 0226  WBC 5.5 5.6 6.3 9.9  HGB 12.3* 13.2 12.7* 12.3*  HCT 39.0 41.4 39.2 37.2*  MCV 92.0 88.7 88.7 87.9  PLT 190 195 199 214   Cardiac Enzymes: Recent Labs  Lab 08/03/17 2014 08/03/17 2326 08/04/17 0320 08/04/17 0855 08/04/17 1512  TROPONINI 0.17* 0.18* 0.16* 0.12* 0.11*   BNP: BNP (last 3 results) Recent Labs    08/04/17 0320  BNP 431.0*    CBG: Recent Labs  Lab 08/07/17 0812 08/07/17 1231 08/07/17 1656 08/07/17 2102 08/08/17 0826  GLUCAP 134* 136* 163* 143* 116*     IMAGING STUDIES Dg Chest 2 View  Result Date: 08/03/2017 CLINICAL DATA:  Chest pain EXAM: CHEST - 2 VIEW COMPARISON:  01/25/2016 FINDINGS: Heart size and vascularity normal. Probable COPD. Negative for infiltrate effusion or mass. Numerous small calcifications in the left lung base are chronic and unchanged. IMPRESSION: No active cardiopulmonary disease. Electronically Signed   By: Franchot Gallo M.D.   On: 08/03/2017 18:33   Nm Myocar Multi W/spect W/wall Motion / Ef  Result Date: 08/05/2017  There was no ST segment deviation noted during stress.  Findings consistent with ischemia.  The  left ventricular ejection fraction is normal (55-65%).  This is an intermediate risk study.  Thinning of the inferior wall Moderate area of lateral wall ischemia at mid and basal level EF 58%    DISCHARGE EXAMINATION: Vitals:   08/07/17 2006 08/07/17 2354 08/08/17 0412 08/08/17 0806  BP: (!) 139/91 (!) 144/93 (!) 150/81 (!) 162/77  Pulse:      Resp: 19 15 16 16   Temp: 97.7 F (36.5 C) (!) 97.5 F (36.4 C) 97.6 F (36.4 C) 98.1 F (36.7 C)  TempSrc:    Oral  SpO2: 100% 100% 96%   Weight:      Height:       General  appearance: alert, cooperative, appears stated age and no distress Head: Normocephalic, without obvious abnormality, atraumatic Resp: clear to auscultation bilaterally Cardio: regular rate and rhythm, S1, S2 normal, no murmur, click, rub or gallop GI: soft, non-tender; bowel sounds normal; no masses,  no organomegaly Extremities: extremities normal, atraumatic, no cyanosis or edema  DISPOSITION: Back to his ALF  Discharge Instructions    (HEART FAILURE PATIENTS) Call MD:  Anytime you have any of the following symptoms: 1) 3 pound weight gain in 24 hours or 5 pounds in 1 week 2) shortness of breath, with or without a dry hacking cough 3) swelling in the hands, feet or stomach 4) if you have to sleep on extra pillows at night in order to breathe.   Complete by:  As directed    Amb Referral to Cardiac Rehabilitation   Complete by:  As directed    Diagnosis:   Coronary Stents NSTEMI PTCA     Call MD for:  difficulty breathing, headache or visual disturbances   Complete by:  As directed    Call MD for:  extreme fatigue   Complete by:  As directed    Call MD for:  persistant dizziness or light-headedness   Complete by:  As directed    Call MD for:  persistant nausea and vomiting   Complete by:  As directed    Call MD for:  severe uncontrolled pain   Complete by:  As directed    Call MD for:  temperature >100.4   Complete by:  As directed    Diet - low sodium heart healthy   Complete by:  As directed    Diet Carb Modified   Complete by:  As directed    Discharge instructions   Complete by:  As directed    Please review instructions on the discharge summary.  You were cared for by a hospitalist during your hospital stay. If you have any questions about your discharge medications or the care you received while you were in the hospital after you are discharged, you can call the unit and asked to speak with the hospitalist on call if the hospitalist that took care of you is not available.  Once you are discharged, your primary care physician will handle any further medical issues. Please note that NO REFILLS for any discharge medications will be authorized once you are discharged, as it is imperative that you return to your primary care physician (or establish a relationship with a primary care physician if you do not have one) for your aftercare needs so that they can reassess your need for medications and monitor your lab values. If you do not have a primary care physician, you can call [phone] for a physician referral.   Increase activity slowly   Complete by:  As directed  Allergies as of 08/08/2017      Reactions   Colchicine Anaphylaxis   Hibiclens [chlorhexidine] Hives   Atorvastatin Other (See Comments)   Reaction:  Joint pain    Ceftin Diarrhea   Celebrex [celecoxib] Swelling   Codeine Hives   Contrast Media [iodinated Diagnostic Agents] Nausea And Vomiting, Rash   Erythromycin Diarrhea   Ezetimibe Other (See Comments)   Reaction:  Joint pain    Oxycodone-acetaminophen Hives   Rosuvastatin Other (See Comments)   Reaction:  Joint pain    Simvastatin Other (See Comments)   Reaction:  Joint pain    Cefpodoxime Rash   Cefuroxime Axetil Rash   Nitroglycerin Other (See Comments)   Reaction:  Lowers pts BP   Tetanus Toxoid Rash   Vantin Other (See Comments)   Reaction:  Unknown       Medication List    STOP taking these medications   diltiazem 120 MG 24 hr capsule Commonly known as:  CARDIZEM CD   furosemide 20 MG tablet Commonly known as:  LASIX     TAKE these medications   alfuzosin 10 MG 24 hr tablet Commonly known as:  UROXATRAL Take 1 tablet (10 mg total) by mouth at bedtime.   aspirin EC 81 MG tablet Take 81 mg by mouth daily.   atropine 1 % ophthalmic solution Place 1 drop into the right eye daily at 6pm for 14 days.   brimonidine-timolol 0.2-0.5 % ophthalmic solution Commonly known as:  COMBIGAN Place 1 drop into both eyes 2  times daily.   brinzolamide 1 % ophthalmic suspension Commonly known as:  AZOPT Place 1 drop into the right eye 3 times daily.   carbidopa-levodopa 25-100 MG tablet Commonly known as:  SINEMET IR Take 1 tablet by mouth 3 (three) times daily.   colesevelam 625 MG tablet Commonly known as:  WELCHOL Take 1,875 mg by mouth daily.   docusate sodium 100 MG capsule Commonly known as:  COLACE Take 100 mg by mouth daily.   fexofenadine 180 MG tablet Commonly known as:  ALLEGRA Take 180 mg by mouth daily.   gabapentin 100 MG capsule Commonly known as:  NEURONTIN Take 100 mg by mouth at bedtime.   hydroxypropyl methylcellulose / hypromellose 2.5 % ophthalmic solution Commonly known as:  ISOPTO TEARS / GONIOVISC Place 1 drop into both eyes 4 (four) times daily.   ICAPS Tabs Take 1 tablet by mouth daily.   latanoprost 0.005 % ophthalmic solution Commonly known as:  XALATAN Place 1 drop into the right eye daily.   levothyroxine 200 MCG tablet Commonly known as:  SYNTHROID, LEVOTHROID Take 200 mcg by mouth daily.   metoprolol succinate 25 MG 24 hr tablet Commonly known as:  TOPROL-XL Take 1 tablet (25 mg total) by mouth daily.   mirabegron ER 50 MG Tb24 tablet Commonly known as:  MYRBETRIQ Take 1 tablet (50 mg total) by mouth daily.   MUCINEX 600 MG 12 hr tablet Generic drug:  guaiFENesin Take by mouth 2 (two) times daily.   omeprazole 40 MG capsule Commonly known as:  PRILOSEC Take 40 mg by mouth daily.   polyethylene glycol packet Commonly known as:  MIRALAX / GLYCOLAX Take 17 g by mouth daily.   potassium chloride 10 MEQ tablet Commonly known as:  K-DUR,KLOR-CON Take 10 mEq by mouth daily.   PROAIR HFA 108 (90 Base) MCG/ACT inhaler Generic drug:  albuterol Inhale 2 puffs into the lungs 3 (three) times daily as needed for wheezing or  shortness of breath.   sitaGLIPtin 50 MG tablet Commonly known as:  JANUVIA Take 1 tablet (50 mg total) by mouth daily.    ticagrelor 90 MG Tabs tablet Commonly known as:  BRILINTA Take 1 tablet (90 mg total) by mouth 2 (two) times daily.   traMADol 50 MG tablet Commonly known as:  ULTRAM Take 1 tablet (50 mg total) by mouth every 6 (six) hours as needed. What changed:    how much to take  when to take this  reasons to take this   URIBEL 118 MG Caps Take 1 capsule (118 mg total) by mouth 3 (three) times daily as needed (burning). What changed:  when to take this   Vitamin D-3 5000 units Tabs Take 5,000 Units by mouth daily.   zolpidem 5 MG tablet Commonly known as:  AMBIEN Take 0.5 tablets (2.5 mg total) by mouth at bedtime as needed for sleep. What changed:  how much to take        Follow-up Information    Turner, Eber Hong, MD Follow up.   Specialty:  Cardiology Why:  her office should call with follow up appointment Contact information: [address] [place] [address] [phone]        Lajean Manes, MD. Schedule an appointment as soon as possible for a visit in 1 week(s).   Specialty:  Internal Medicine Contact information: [address] [place] [address] [phone]           TOTAL DISCHARGE TIME: 35 mins  Bonnielee Haff  [facility] Pager [phone]  08/08/2017, 10:17 AM

## 2017-08-08 NOTE — NC FL2 (Signed)
[place] LEVEL OF CARE SCREENING TOOL     IDENTIFICATION  Patient Name: [name] Birthdate: 1931-01-20 Sex: male Admission Date (Current Location): 08/03/2017  [facility] and [place] Number:  Herbalist and Address:  [facility]. [facility], [place] [address], [place], [place] [address]      Provider Number: [id number]  Attending Physician Name and Address:  Bonnielee Haff, MD  Relative Name and Phone Number:       Current Level of Care: Hospital Recommended Level of Care: [place] Prior Approval Number:    Date Approved/Denied:   PASRR Number:    Discharge Plan: Other (Comment)(Assisted Living Facility)    Current Diagnoses: Patient Active Problem List   Diagnosis Date Noted  . Non-ST elevation (NSTEMI) myocardial infarction ([place])   . GERD (gastroesophageal reflux disease) 08/04/2017  . Elevated troponin 08/03/2017  . Chest pain 08/03/2017  . CKD (chronic kidney disease), stage III ([place]) 08/03/2017  . Type II diabetes mellitus with renal manifestations ([place]) 08/03/2017  . Pseudophakia of both eyes 05/31/2017  . Retained ureteral stent 04/22/2016  . Ureteral calculus 01/25/2016  . Mental status change 10/24/2015  . Encephalopathy 10/24/2015  . Hypothermia 10/24/2015  . Chronic renal insufficiency 10/24/2015  . Benign essential HTN 10/24/2015  . Hypoglycemia 10/24/2015  . Acute encephalopathy 10/24/2015  . CVA (cerebral vascular accident) ([place]) 10/14/2014  . Prerenal azotemia 10/14/2014  . Mild HTN 10/14/2014  . CVA (cerebral infarction)   . TIA (transient ischemic attack)   . Diabetes mellitus type 2 without retinopathy ([place]) 03/27/2014  . CME (cystoid macular edema), left 08/10/2013  . PNAR (perennial non-allergic rhinitis) 05/23/2013  . Squamous cell carcinoma of eyelid 05/15/2013  . Squamous cell carcinoma 04/06/2013  . Blepharitis 03/16/2013  . Eyelid lesion 03/16/2013  . Seasonal and perennial  allergic rhinitis 06/11/2012  . Posterior vitreous detachment of right eye 10/23/2011  . Exudative age-related macular degeneration ([place]) 07/28/2011  . Nonproliferative diabetic retinopathy ([place]) 04/07/2011  . Acute bronchitis due to infection 01/14/2011  . Nondiabetic proliferative retinopathy 10/17/2010  . Insomnia, unspecified 05/19/2010  . ATAXIA 03/17/2010  . DIZZINESS 03/06/2010  . Chronic constipation 02/21/2008  . OSTEOARTHRITIS, KNEE, SEVERE 01/12/2008  . HYPERLIPIDEMIA 01/17/2007  . Hypothyroidism (post surgical) 10/15/2006  . SKIN CANCER, HX OF 06/08/2006  . Diabetes mellitus, insulin dependent (IDDM), controlled ([place]) 04/22/2006  . URTICARIA 04/22/2006  . POPLITEAL CYST 04/22/2006  . THYROIDECTOMY, SUBTOTAL, HX OF 04/22/2006  . TOTAL KNEE REPLACEMENT, HX OF 04/22/2006    Orientation RESPIRATION BLADDER Height & Weight     Self, Time, Place  Normal Continent Weight: 143 lb 12.8 oz (65.2 kg) Height:  5\' 11"  (180.3 cm)  BEHAVIORAL SYMPTOMS/MOOD NEUROLOGICAL BOWEL NUTRITION STATUS      Continent Diet(Heart healthy, thin liquids)  AMBULATORY STATUS COMMUNICATION OF NEEDS Skin   Limited Assist Verbally Normal                       Personal Care Assistance Level of Assistance  Bathing, Feeding, Dressing Bathing Assistance: Limited assistance Feeding assistance: Independent Dressing Assistance: Limited assistance     Functional Limitations Info  Sight, Hearing, Speech Sight Info: Adequate Hearing Info: Adequate Speech Info: Adequate    SPECIAL CARE FACTORS FREQUENCY                       Contractures Contractures Info: Not present    Additional Factors Info  Code  Status, Allergies Code Status Info: DNR Allergies Info: Colchicine, Hibiclens Chlorhexidine, Atorvastatin, Ceftin, Celebrex Celecoxib, Codeine, Contrast Media Iodinated Diagnostic Agents, Erythromycin, Ezetimibe, Oxycodone-acetaminophen, Rosuvastatin, Simvastatin, Cefpodoxime, Cefuroxime  Axetil, Nitroglycerin, Tetanus Toxoid, Vantin           Current Medications (08/08/2017):  This is the current hospital active medication list Current Facility-Administered Medications  Medication Dose Route Frequency Provider Last Rate Last Dose  . 0.9 %  sodium chloride infusion  250 mL Intravenous PRN Larae Grooms S, MD      . 0.9 %  sodium chloride infusion   Intravenous Continuous Bonnielee Haff, MD 75 mL/hr at 08/08/17 0100    . acetaminophen (TYLENOL) tablet 650 mg  650 mg Oral Q4H PRN Jettie Booze, MD      . albuterol (PROVENTIL) (2.5 MG/3ML) 0.083% nebulizer solution 2.5 mg  2.5 mg Nebulization Q4H PRN Jettie Booze, MD      . alfuzosin (UROXATRAL) 24 hr tablet 10 mg  10 mg Oral QHS Jettie Booze, MD   10 mg at 08/07/17 2201  . aspirin chewable tablet 81 mg  81 mg Oral Daily Jettie Booze, MD   81 mg at 08/08/17 0913  . atropine 1 % ophthalmic solution 1 drop  1 drop Right Eye TID Jettie Booze, MD   1 drop at 08/08/17 3614  . brimonidine (ALPHAGAN) 0.2 % ophthalmic solution 1 drop  1 drop Both Eyes BID Jettie Booze, MD   1 drop at 08/07/17 2204   And  . timolol (TIMOPTIC) 0.5 % ophthalmic solution 1 drop  1 drop Both Eyes BID Jettie Booze, MD   1 drop at 08/07/17 2204  . brinzolamide (AZOPT) 1 % ophthalmic suspension 1 drop  1 drop Right Eye TID Jettie Booze, MD   1 drop at 08/08/17 0915  . carbidopa-levodopa (SINEMET IR) 25-100 MG per tablet immediate release 1 tablet  1 tablet Oral TID WC Jettie Booze, MD   1 tablet at 08/08/17 [phone]  . cholecalciferol (VITAMIN D) tablet 5,000 Units  5,000 Units Oral Daily Jettie Booze, MD   5,000 Units at 08/08/17 0914  . colesevelam [facility]) tablet 1,875 mg  1,875 mg Oral Q breakfast Jettie Booze, MD   1,875 mg at 08/08/17 4008  . docusate sodium (COLACE) capsule 100 mg  100 mg Oral Daily Jettie Booze, MD   100 mg at 08/07/17 6761  . enoxaparin  (LOVENOX) injection 40 mg  40 mg Subcutaneous Daily Jettie Booze, MD   40 mg at 08/08/17 0914  . gabapentin (NEURONTIN) capsule 100 mg  100 mg Oral QHS Jettie Booze, MD   100 mg at 08/07/17 2201  . guaiFENesin (MUCINEX) 12 hr tablet 600 mg  600 mg Oral BID PRN Jettie Booze, MD      . hydroxypropyl methylcellulose / hypromellose (ISOPTO TEARS / GONIOVISC) 2.5 % ophthalmic solution 1 drop  1 drop Both Eyes TID AC & HS Jettie Booze, MD   1 drop at 08/08/17 [phone]  . insulin aspart (novoLOG) injection 0-5 Units  0-5 Units Subcutaneous QHS Jettie Booze, MD   2 Units at 08/06/17 2310  . insulin aspart (novoLOG) injection 0-9 Units  0-9 Units Subcutaneous TID WC Jettie Booze, MD   2 Units at 08/07/17 1746  . latanoprost (XALATAN) 0.005 % ophthalmic solution 1 drop  1 drop Right Eye QHS Jettie Booze, MD   1 drop at 08/07/17 2207  .  levothyroxine (SYNTHROID, LEVOTHROID) tablet 200 mcg  200 mcg Oral QAC breakfast Jettie Booze, MD   200 mcg at 08/08/17 4037  . loratadine (CLARITIN) tablet 10 mg  10 mg Oral Daily Jettie Booze, MD   10 mg at 08/08/17 0913  . metoprolol succinate (TOPROL-XL) 24 hr tablet 25 mg  25 mg Oral Daily Sueanne Margarita, MD   25 mg at 08/08/17 0913  . multivitamin (PROSIGHT) tablet 1 tablet  1 tablet Oral Daily Jettie Booze, MD   1 tablet at 08/08/17 0914  . ondansetron (ZOFRAN) injection 4 mg  4 mg Intravenous Q6H PRN Jettie Booze, MD      . pantoprazole (PROTONIX) EC tablet 40 mg  40 mg Oral Daily Jettie Booze, MD   40 mg at 08/08/17 0913  . polyethylene glycol (MIRALAX / GLYCOLAX) packet 17 g  17 g Oral Daily Jettie Booze, MD   17 g at 08/05/17 1044  . sodium chloride flush (NS) 0.9 % injection 3 mL  3 mL Intravenous Q12H Jettie Booze, MD   3 mL at 08/07/17 2208  . sodium chloride flush (NS) 0.9 % injection 3 mL  3 mL Intravenous PRN Jettie Booze, MD      . ticagrelor  [facility]) tablet 90 mg  90 mg Oral BID Bonnielee Haff, MD   90 mg at 08/08/17 0913  . traMADol (ULTRAM) tablet 25 mg  25 mg Oral Q6H PRN Jettie Booze, MD         Discharge Medications: Please see discharge summary for a list of discharge medications.  Relevant Imaging Results:  Relevant Lab Results:   Additional Information SSN: [id number]  Eileen Stanford, LCSW

## 2017-08-08 NOTE — Progress Notes (Signed)
Removed PIV access x2 and discharge instructions to patient's sister [name]. She understood well. Informed pt's assist living facilities he is going back today with updating information. Contacted case manager & social worker regarding discharge him today. Pt's sister took his all belongings. HS [employer]

## 2017-08-08 NOTE — Care Management (Signed)
Spoke with patient & sister (caregiver) about d/c and Brilinta.  Brilinta cards given.  Advised sister, [name], to fill one month of Brilinta with free card and call ahead to pharmacy to make sure it is in stock.  Pt usually gets medication through [facility]- advised sister to try to find out copay this week and make appropriate plans to get scripts filled next month. Sister plans to transport patient in her car and requests to be called when pt ready for d/c [phone]).

## 2017-08-08 NOTE — Clinical Social Work Note (Addendum)
Clinical Social Worker facilitated patient discharge including contacting patient family and facility to confirm patient discharge plans.  Clinical information faxed to facility and family agreeable with plan.  Pt's sister will transport pt to [facility].  RN to call [phone] for report prior to discharge.  Clinical Social Worker will sign off for now as social work intervention is no longer needed. Please consult [place] again if new need arises.  Loletha Grayer, MSW [phone]

## 2017-08-08 NOTE — Discharge Instructions (Signed)
Ticagrelor oral tablet °What is this medicine? °TICAGRELOR (TYE ka GREL or) helps to prevent blood clots. This medicine is used to prevent heart attack, stroke, or other vascular events in people who have had a recent heart attack or who have severe chest pain. °This medicine may be used for other purposes; ask your health care provider or pharmacist if you have questions. °COMMON BRAND NAME(S): BRILINTA °What should I tell my health care provider before I take this medicine? °They need to know if you have any of these conditions: °-bleeding disorders °-bleeding in the brain °-having surgery °-history of irregular heartbeat °-history of stomach bleeding °-liver disease °-an unusual or allergic reaction to ticagrelor, other medicines, foods, dyes, or preservatives °-pregnant or trying to get pregnant °-breast-feeding °How should I use this medicine? °Take this medicine by mouth with a glass of water. Follow the directions on the prescription label. You can take it with or without food. If it upsets your stomach, take it with food. Take your medicine at regular intervals. Do not take it more often than directed. Do not stop taking except on your doctor's advice. °Talk to you pediatrician regarding the use of this medicine in children. Special care may be needed. °Overdosage: If you think you have taken too much of this medicine contact a poison control center or emergency room at once. °NOTE: This medicine is only for you. Do not share this medicine with others. °What if I miss a dose? °If you miss a dose, take it as soon as you can. If it is almost time for your next dose, take only that dose. Do not take double or extra doses. °What may interact with this medicine? °-certain antibiotics like clarithromycin and telithromycin °-certain medicines for fungal infections like itraconazole, ketoconazole, and voriconazole °-certain medicines for HIV infection like atazanavir, indinavir, nelfinavir, ritonavir, and  saquinavir °-certain medicines for seizures like carbamazepine, phenobarbital, and phenytoin °-certain medicines that treat or prevent blood clots like warfarin °-dexamethasone °-digoxin °-lovastatin °-nefazodone °-rifampin °-simvastatin °This list may not describe all possible interactions. Give your health care provider a list of all the medicines, herbs, non-prescription drugs, or dietary supplements you use. Also tell them if you smoke, drink alcohol, or use illegal drugs. Some items may interact with your medicine. °What should I watch for while using this medicine? °Visit your doctor or health care professional for regular check ups. Do not stop taking you medicine unless your doctor tells you to. °Notify your doctor or health care professional and seek emergency treatment if you develop breathing problems; changes in vision; chest pain; severe, sudden headache; pain, swelling, warmth in the leg; trouble speaking; sudden numbness or weakness of the face, arm, or leg. These can be signs that your condition has gotten worse. °If you are going to have surgery or dental work, tell your doctor or health care professional that you are taking this medicine. °You should take aspirin every day with this medicine. Do not take more than 100 mg each day. Talk to your doctor if you have questions. °What side effects may I notice from receiving this medicine? °Side effects that you should report to your doctor or health care professional as soon as possible: °-allergic reactions like skin rash, itching or hives, swelling of the face, lips, or tongue °-breathing problems °-fast or irregular heartbeat °-feeling faint or light-headed, falls °-signs and symptoms of bleeding such as bloody or black, tarry stools; red or dark-brown urine; spitting up blood or brown material that looks   like coffee grounds; red spots on the skin; unusual bruising or bleeding from the eye, gums, or nose Side effects that usually do not require  medical attention (report to your doctor or health care professional if they continue or are bothersome): -breast enlargement in both males and females -diarrhea -dizziness -headache -tiredness -upset stomach This list may not describe all possible side effects. Call your doctor for medical advice about side effects. You may report side effects to FDA at 1-800-FDA-1088. Where should I keep my medicine? Keep out of the reach of children. Store at room temperature of 59 to 86 degrees F (15 to 30 degrees C). Throw away any unused medicine after the expiration date. NOTE: This sheet is a summary. It may not cover all possible information. If you have questions about this medicine, talk to your doctor, pharmacist, or health care provider.  2018 Elsevier/Gold Standard (2015-01-31 11:53:14)   Coronary Angiogram With Stent, Care After This sheet gives you information about how to care for yourself after your procedure. Your health care provider may also give you more specific instructions. If you have problems or questions, contact your health care provider. What can I expect after the procedure? After your procedure, it is common to have:  Bruising in the area where a small, thin tube (catheter) was inserted. This usually fades within 1-2 weeks.  Blood collecting in the tissue (hematoma) that may be painful to the touch. It should usually decrease in size and tenderness within 1-2 weeks.  Follow these instructions at home: Insertion area care  Do not take baths, swim, or use a hot tub until your health care provider approves.  You may shower 24-48 hours after the procedure or as directed by your health care provider.  Follow instructions from your health care provider about how to take care of your incision. Make sure you: ? Wash your hands with soap and water before you change your bandage (dressing). If soap and water are not available, use hand sanitizer. ? Change your dressing as told  by your health care provider. ? Leave stitches (sutures), skin glue, or adhesive strips in place. These skin closures may need to stay in place for 2 weeks or longer. If adhesive strip edges start to loosen and curl up, you may trim the loose edges. Do not remove adhesive strips completely unless your health care provider tells you to do that.  Remove the bandage (dressing) and gently wash the catheter insertion site with plain soap and water.  Pat the area dry with a clean towel. Do not rub the area, because that may cause bleeding.  Do not apply powder or lotion to the incision area.  Check your incision area every day for signs of infection. Check for: ? More redness, swelling, or pain. ? More fluid or blood. ? Warmth. ? Pus or a bad smell. Activity  Do not drive for 24 hours if you were given a medicine to help you relax (sedative).  Do not lift anything that is heavier than 10 lb (4.5 kg) for 5 days after your procedure or as directed by your health care provider.  Ask your health care provider when it is okay for you: ? To return to work or school. ? To resume usual physical activities or sports. ? To resume sexual activity. Eating and drinking  Eat a heart-healthy diet. This should include plenty of fresh fruits and vegetables.  Avoid the following types of food: ? Food that is high in salt. ?  Canned or highly processed food. ? Food that is high in saturated fat or sugar. ? [employer].  Limit alcohol intake to no more than 1 drink a day for non-pregnant women and 2 drinks a day for men. One drink equals 12 oz of beer, 5 oz of wine, or 1 oz of hard liquor. Lifestyle  Do not use any products that contain nicotine or tobacco, such as cigarettes and e-cigarettes. If you need help quitting, ask your health care provider.  Take steps to manage and control your weight.  Get regular exercise.  Manage your blood pressure.  Manage other health problems, such as  diabetes. General instructions  Take over-the-counter and prescription medicines only as told by your health care provider. Blood thinners may be prescribed after your procedure to improve blood flow through the stent.  If you need an MRI after your heart stent has been placed, be sure to tell the health care provider who orders the MRI that you have a heart stent.  Keep all follow-up visits as directed by your health care provider. This is important. Contact a health care provider if:  You have a fever.  You have chills.  You have increased bleeding from the catheter insertion area. Hold pressure on the area. Get help right away if:  You develop chest pain or shortness of breath.  You feel faint or you pass out.  You have unusual pain at the catheter insertion area.  You have redness, warmth, or swelling at the catheter insertion area.  You have drainage (other than a small amount of blood on the dressing) from the catheter insertion area.  The catheter insertion area is bleeding, and the bleeding does not stop after 30 minutes of holding steady pressure on the area.  You develop bleeding from any other place, such as from your rectum. There may be bright red blood in your urine or stool, or it may appear as black, tarry stool. This information is not intended to replace advice given to you by your health care provider. Make sure you discuss any questions you have with your health care provider. Document Released: 07/18/2004 Document Revised: 09/26/2015 Document Reviewed: 09/26/2015 Elsevier Interactive Patient Education  2018 [place].   Acute Coronary Syndrome Acute coronary syndrome (ACS) is a serious problem in which there is suddenly not enough blood and oxygen reaching the heart. ACS can result in chest pain or a heart attack. What are the causes? This condition may be caused by:  A buildup of fat and cholesterol inside of the arteries (atherosclerosis). This is  the most common cause. The buildup (plaque) can cause the blood vessels in your heart (coronary arteries) to become narrow or blocked. Plaque can also break off to form a clot.  A coronary spasm.  A tearing of the coronary artery (spontaneous coronary artery dissection).  Low blood pressure (hypotension).  An abnormal heart beat (arrhythmia).  Using cocaine or methamphetamine.  What increases the risk? The following factors may make you more likely to develop this condition:  Age.  History of chest pain, heart attack, or stroke.  Being male.  Family history of chest pain, heart disease, or stroke.  Smoking.  Inactivity.  Being overweight.  High cholesterol.  High blood pressure (hypertension).  Diabetes.  Excessive alcohol use.  What are the signs or symptoms? Common symptoms of this condition include:  Chest pain. The pain may last long, or may stop and come back (recur). It may feel like: ?  Crushing or squeezing. ? Tightness, pressure, fullness, or heaviness.  Arm, neck, jaw, or back pain.  Heartburn or indigestion.  Shortness of breath.  Nausea.  Sudden cold sweats.  Lightheadedness.  Dizziness.  Tiredness (fatigue).  Sometimes there are no symptoms. How is this diagnosed? This condition may be diagnosed through:  An electrocardiogram (ECG). This test records the impulses of the heart.  Blood tests.  A CT scan of the chest.  A coronary angiogram. This procedure checks for a blockage in the coronary arteries.  How is this treated? Treatment for this condition may include:  Oxygen.  Medicines, such as: ? Antiplatelet medicines and blood-thinning medicines, such as aspirin. These help prevent blood clots. ? Fibrinolytic therapy. This breaks apart a blood clot. ? Blood pressure medicines. ? Nitroglycerin. ? Pain medicine. ? Cholesterol medicine.  A procedure called coronary angioplasty and stenting. This is done to widen a narrowed  artery and keep it open.  Coronary artery bypass surgery. This allows blood to pass the blockage to reach your heart.  Cardiac rehabilitation. This is a program that helps improve your health and well-being. It includes exercise training, education, and counseling to help you recover.  Follow these instructions at home: Eating and drinking  Follow a heart-healthy, low-salt (sodium) diet.  Use healthy cooking methods such as roasting, grilling, broiling, baking, poaching, steaming, or stir-frying.  Talk to a dietitian to learn about healthy cooking methods and how to eat less sodium. Medicines  Take over-the-counter and prescription medicines only as told by your health care provider.  Do not take these medicines unless your health care provider approves: ? Nonsteroidal anti-inflammatory drugs (NSAIDs), such as ibuprofen, naproxen, or celecoxib. ? Vitamin supplements that contain vitamin A or vitamin E. ? Hormone replacement therapy that contains estrogen. Activity  Join a cardiac rehabilitation program.  Ask your health care provider: ? What activities and exercises are safe for you. ? If you should follow specific instructions about lifting, driving, or climbing stairs.  If you are taking aspirin and another blood thinning medicine, avoid activities that are likely to result in an injury. The medicines can increase your risk of bleeding. Lifestyle  Do not use any products that contain nicotine or tobacco, such as cigarettes and e-cigarettes. If you need help quitting, ask your health care provider.  If you drink alcohol and your health care provider says it is okay to drink, limit your alcohol intake to no more than 1 drink per day. One drink equals 12 oz of beer, 5 oz of wine, or 1 oz of hard liquor.  Maintain a healthy weight. If you need to lose weight, do it in a way that has been approved by your health care provider. General instructions  Tell all your health care  providers about your heart condition, including your dentist. Some medicines can increase your risk of arrhythmia.  Manage other health conditions, such as hypertension and diabetes. These conditions affect your heart.  Learn ways to manage stress.  Get screened for depression, and seek treatment if needed.  Monitor your blood pressure if told by your health care provider.  Keep your vaccinations up to date. Get the annual influenza vaccine.  Keep all follow-up visits as told by your health care provider. This is important. Contact a health care provider if:  You feel overwhelmed or sad.  You have trouble with your daily activities. Get help right away if:  You have pain in your chest, neck, arm, jaw, stomach, or back  that recurs, and: ? Lasts more than a few minutes. ? Is not relieved by taking the [place] health care provider prescribed.  You have unexplained: ? Heavy sweating. ? Heartburn or indigestion. ? Shortness of breath. ? Difficulty breathing. ? Nausea or vomiting. ? Fatigue. ? Nervousness or anxiety. ? Weakness. ? Diarrhea. ? Dark stools or blood in the stool.  You have sudden lightheadedness or dizziness.  Your blood pressure is higher than 180/120  You faint.  You feel like hurting yourself or think about taking your own life. These symptoms may represent a serious problem that is an emergency. Do not wait to see if the symptoms will go away. Get medical help right away. Call your local emergency services (911 in the [place] Do not drive yourself to the clinic or hospital. Summary  Acute coronary syndrome (ACS) is a when there is not enough blood and oxygen being supplied to the heart. ACS can result in chest pain or a heart attack.  Acute coronary syndrome is a medical emergency. If you have any symptoms of this condition, get help right away.  Treatment includes oxygen, medicines, and procedures to open the blocked arteries and restore blood  flow. This information is not intended to replace advice given to you by your health care provider. Make sure you discuss any questions you have with your health care provider. Document Released: 12/29/2004 Document Revised: 01/31/2016 Document Reviewed: 01/31/2016 Elsevier Interactive Patient Education  [employer].

## 2017-08-10 DIAGNOSIS — J302 Other seasonal allergic rhinitis: Secondary | ICD-10-CM | POA: Diagnosis not present

## 2017-08-10 DIAGNOSIS — G3184 Mild cognitive impairment, so stated: Secondary | ICD-10-CM | POA: Diagnosis not present

## 2017-08-10 DIAGNOSIS — R600 Localized edema: Secondary | ICD-10-CM | POA: Diagnosis not present

## 2017-08-10 DIAGNOSIS — R079 Chest pain, unspecified: Secondary | ICD-10-CM | POA: Diagnosis not present

## 2017-08-10 DIAGNOSIS — G4700 Insomnia, unspecified: Secondary | ICD-10-CM | POA: Diagnosis not present

## 2017-08-10 DIAGNOSIS — N400 Enlarged prostate without lower urinary tract symptoms: Secondary | ICD-10-CM | POA: Diagnosis not present

## 2017-08-10 DIAGNOSIS — E039 Hypothyroidism, unspecified: Secondary | ICD-10-CM | POA: Diagnosis not present

## 2017-08-10 DIAGNOSIS — G20 Parkinson's disease: Secondary | ICD-10-CM | POA: Diagnosis not present

## 2017-08-10 DIAGNOSIS — F5101 Primary insomnia: Secondary | ICD-10-CM | POA: Diagnosis not present

## 2017-08-10 DIAGNOSIS — K5900 Constipation, unspecified: Secondary | ICD-10-CM | POA: Diagnosis not present

## 2017-08-10 DIAGNOSIS — R05 Cough: Secondary | ICD-10-CM | POA: Diagnosis not present

## 2017-08-10 DIAGNOSIS — E559 Vitamin D deficiency, unspecified: Secondary | ICD-10-CM | POA: Diagnosis not present

## 2017-08-10 DIAGNOSIS — H409 Unspecified glaucoma: Secondary | ICD-10-CM | POA: Diagnosis not present

## 2017-08-10 DIAGNOSIS — E119 Type 2 diabetes mellitus without complications: Secondary | ICD-10-CM | POA: Diagnosis not present

## 2017-08-10 DIAGNOSIS — E782 Mixed hyperlipidemia: Secondary | ICD-10-CM | POA: Diagnosis not present

## 2017-08-10 DIAGNOSIS — K219 Gastro-esophageal reflux disease without esophagitis: Secondary | ICD-10-CM | POA: Diagnosis not present

## 2017-08-10 DIAGNOSIS — I10 Essential (primary) hypertension: Secondary | ICD-10-CM | POA: Diagnosis not present

## 2017-08-12 ENCOUNTER — Telehealth: Payer: Self-pay | Admitting: Cardiology

## 2017-08-12 NOTE — Telephone Encounter (Signed)
New Message:      TOC 7-14 days with Servando Snare on 08/30/17 @ 11:30 per Rob Hickman

## 2017-08-13 ENCOUNTER — Emergency Department (HOSPITAL_COMMUNITY): Payer: Medicare Other

## 2017-08-13 ENCOUNTER — Inpatient Hospital Stay (HOSPITAL_COMMUNITY): Payer: Medicare Other | Admitting: Anesthesiology

## 2017-08-13 ENCOUNTER — Encounter (HOSPITAL_COMMUNITY): Payer: Self-pay

## 2017-08-13 ENCOUNTER — Other Ambulatory Visit: Payer: Self-pay

## 2017-08-13 ENCOUNTER — Inpatient Hospital Stay (HOSPITAL_COMMUNITY): Payer: Medicare Other

## 2017-08-13 ENCOUNTER — Encounter (HOSPITAL_COMMUNITY)
Admission: EM | Disposition: A | Discharge status: Nursing Home (Includes ICF) | Payer: Self-pay | Source: Home / Self Care | Attending: Internal Medicine

## 2017-08-13 ENCOUNTER — Inpatient Hospital Stay (HOSPITAL_COMMUNITY)
Admission: EM | Admit: 2017-08-13 | Discharge: 2017-08-17 | DRG: 854 | Disposition: A | Discharge status: Nursing Home (Includes ICF) | Payer: Medicare Other | Attending: Internal Medicine | Admitting: Internal Medicine

## 2017-08-13 DIAGNOSIS — N183 Chronic kidney disease, stage 3 (moderate): Secondary | ICD-10-CM | POA: Diagnosis not present

## 2017-08-13 DIAGNOSIS — N1330 Unspecified hydronephrosis: Secondary | ICD-10-CM | POA: Diagnosis not present

## 2017-08-13 DIAGNOSIS — E785 Hyperlipidemia, unspecified: Secondary | ICD-10-CM | POA: Diagnosis not present

## 2017-08-13 DIAGNOSIS — I4891 Unspecified atrial fibrillation: Secondary | ICD-10-CM

## 2017-08-13 DIAGNOSIS — E039 Hypothyroidism, unspecified: Secondary | ICD-10-CM | POA: Diagnosis present

## 2017-08-13 DIAGNOSIS — I214 Non-ST elevation (NSTEMI) myocardial infarction: Secondary | ICD-10-CM | POA: Diagnosis present

## 2017-08-13 DIAGNOSIS — E119 Type 2 diabetes mellitus without complications: Secondary | ICD-10-CM | POA: Diagnosis not present

## 2017-08-13 DIAGNOSIS — Z79890 Hormone replacement therapy (postmenopausal): Secondary | ICD-10-CM | POA: Diagnosis not present

## 2017-08-13 DIAGNOSIS — I10 Essential (primary) hypertension: Secondary | ICD-10-CM | POA: Diagnosis not present

## 2017-08-13 DIAGNOSIS — E1122 Type 2 diabetes mellitus with diabetic chronic kidney disease: Secondary | ICD-10-CM | POA: Diagnosis not present

## 2017-08-13 DIAGNOSIS — Z888 Allergy status to other drugs, medicaments and biological substances status: Secondary | ICD-10-CM

## 2017-08-13 DIAGNOSIS — Z885 Allergy status to narcotic agent status: Secondary | ICD-10-CM | POA: Diagnosis not present

## 2017-08-13 DIAGNOSIS — N189 Chronic kidney disease, unspecified: Secondary | ICD-10-CM

## 2017-08-13 DIAGNOSIS — Z881 Allergy status to other antibiotic agents status: Secondary | ICD-10-CM | POA: Diagnosis not present

## 2017-08-13 DIAGNOSIS — I252 Old myocardial infarction: Secondary | ICD-10-CM | POA: Diagnosis not present

## 2017-08-13 DIAGNOSIS — Z794 Long term (current) use of insulin: Secondary | ICD-10-CM | POA: Diagnosis not present

## 2017-08-13 DIAGNOSIS — I25119 Atherosclerotic heart disease of native coronary artery with unspecified angina pectoris: Secondary | ICD-10-CM | POA: Diagnosis present

## 2017-08-13 DIAGNOSIS — Z466 Encounter for fitting and adjustment of urinary device: Secondary | ICD-10-CM | POA: Diagnosis not present

## 2017-08-13 DIAGNOSIS — Z66 Do not resuscitate: Secondary | ICD-10-CM | POA: Diagnosis not present

## 2017-08-13 DIAGNOSIS — Z91041 Radiographic dye allergy status: Secondary | ICD-10-CM

## 2017-08-13 DIAGNOSIS — R0902 Hypoxemia: Secondary | ICD-10-CM | POA: Diagnosis not present

## 2017-08-13 DIAGNOSIS — Z79899 Other long term (current) drug therapy: Secondary | ICD-10-CM

## 2017-08-13 DIAGNOSIS — A419 Sepsis, unspecified organism: Principal | ICD-10-CM | POA: Diagnosis present

## 2017-08-13 DIAGNOSIS — Z923 Personal history of irradiation: Secondary | ICD-10-CM | POA: Diagnosis not present

## 2017-08-13 DIAGNOSIS — R319 Hematuria, unspecified: Secondary | ICD-10-CM

## 2017-08-13 DIAGNOSIS — N136 Pyonephrosis: Secondary | ICD-10-CM | POA: Diagnosis not present

## 2017-08-13 DIAGNOSIS — I480 Paroxysmal atrial fibrillation: Secondary | ICD-10-CM | POA: Diagnosis present

## 2017-08-13 DIAGNOSIS — N201 Calculus of ureter: Secondary | ICD-10-CM

## 2017-08-13 DIAGNOSIS — K219 Gastro-esophageal reflux disease without esophagitis: Secondary | ICD-10-CM | POA: Diagnosis not present

## 2017-08-13 DIAGNOSIS — I2510 Atherosclerotic heart disease of native coronary artery without angina pectoris: Secondary | ICD-10-CM

## 2017-08-13 DIAGNOSIS — M1990 Unspecified osteoarthritis, unspecified site: Secondary | ICD-10-CM | POA: Diagnosis not present

## 2017-08-13 DIAGNOSIS — Z955 Presence of coronary angioplasty implant and graft: Secondary | ICD-10-CM

## 2017-08-13 DIAGNOSIS — D649 Anemia, unspecified: Secondary | ICD-10-CM | POA: Diagnosis not present

## 2017-08-13 DIAGNOSIS — Z8546 Personal history of malignant neoplasm of prostate: Secondary | ICD-10-CM | POA: Diagnosis not present

## 2017-08-13 DIAGNOSIS — Z8673 Personal history of transient ischemic attack (TIA), and cerebral infarction without residual deficits: Secondary | ICD-10-CM | POA: Diagnosis not present

## 2017-08-13 DIAGNOSIS — I129 Hypertensive chronic kidney disease with stage 1 through stage 4 chronic kidney disease, or unspecified chronic kidney disease: Secondary | ICD-10-CM | POA: Diagnosis not present

## 2017-08-13 DIAGNOSIS — N131 Hydronephrosis with ureteral stricture, not elsewhere classified: Secondary | ICD-10-CM | POA: Diagnosis not present

## 2017-08-13 DIAGNOSIS — N390 Urinary tract infection, site not specified: Secondary | ICD-10-CM | POA: Diagnosis not present

## 2017-08-13 DIAGNOSIS — R509 Fever, unspecified: Secondary | ICD-10-CM | POA: Diagnosis not present

## 2017-08-13 DIAGNOSIS — Z96653 Presence of artificial knee joint, bilateral: Secondary | ICD-10-CM | POA: Diagnosis present

## 2017-08-13 DIAGNOSIS — Q6211 Congenital occlusion of ureteropelvic junction: Secondary | ICD-10-CM | POA: Diagnosis not present

## 2017-08-13 DIAGNOSIS — M25552 Pain in left hip: Secondary | ICD-10-CM | POA: Diagnosis not present

## 2017-08-13 DIAGNOSIS — Z7982 Long term (current) use of aspirin: Secondary | ICD-10-CM

## 2017-08-13 DIAGNOSIS — G20 Parkinson's disease: Secondary | ICD-10-CM | POA: Diagnosis not present

## 2017-08-13 DIAGNOSIS — N132 Hydronephrosis with renal and ureteral calculous obstruction: Secondary | ICD-10-CM | POA: Diagnosis not present

## 2017-08-13 DIAGNOSIS — J9811 Atelectasis: Secondary | ICD-10-CM | POA: Diagnosis not present

## 2017-08-13 HISTORY — PX: CYSTOSCOPY W/ URETERAL STENT PLACEMENT: SHX1429

## 2017-08-13 LAB — URINALYSIS, MICROSCOPIC (REFLEX): SQUAMOUS EPITHELIAL / LPF: NONE SEEN (ref 0–5)

## 2017-08-13 LAB — CBC WITH DIFFERENTIAL/PLATELET
Abs Immature Granulocytes: 0.1 10*3/uL (ref 0.0–0.1)
Basophils Absolute: 0 10*3/uL (ref 0.0–0.1)
Basophils Relative: 0 %
EOS ABS: 0 10*3/uL (ref 0.0–0.7)
Eosinophils Relative: 0 %
HEMATOCRIT: 34.4 % — AB (ref 39.0–52.0)
HEMOGLOBIN: 11 g/dL — AB (ref 13.0–17.0)
IMMATURE GRANULOCYTES: 1 %
LYMPHS ABS: 0.5 10*3/uL — AB (ref 0.7–4.0)
LYMPHS PCT: 5 %
MCH: 29.3 pg (ref 26.0–34.0)
MCHC: 32 g/dL (ref 30.0–36.0)
MCV: 91.5 fL (ref 78.0–100.0)
MONOS PCT: 9 %
Monocytes Absolute: 0.9 10*3/uL (ref 0.1–1.0)
Neutro Abs: 8.3 10*3/uL — ABNORMAL HIGH (ref 1.7–7.7)
Neutrophils Relative %: 85 %
Platelets: 157 10*3/uL (ref 150–400)
RBC: 3.76 MIL/uL — ABNORMAL LOW (ref 4.22–5.81)
RDW: 13.3 % (ref 11.5–15.5)
WBC: 9.8 10*3/uL (ref 4.0–10.5)

## 2017-08-13 LAB — URINALYSIS, ROUTINE W REFLEX MICROSCOPIC
Bilirubin Urine: NEGATIVE
Glucose, UA: NEGATIVE mg/dL
Ketones, ur: NEGATIVE mg/dL
NITRITE: NEGATIVE
PROTEIN: 100 mg/dL — AB
SPECIFIC GRAVITY, URINE: 1.02 (ref 1.005–1.030)
pH: 7 (ref 5.0–8.0)

## 2017-08-13 LAB — GLUCOSE, CAPILLARY: GLUCOSE-CAPILLARY: 83 mg/dL (ref 70–99)

## 2017-08-13 LAB — I-STAT CG4 LACTIC ACID, ED
LACTIC ACID, VENOUS: 0.66 mmol/L (ref 0.5–1.9)
Lactic Acid, Venous: 0.76 mmol/L (ref 0.5–1.9)

## 2017-08-13 LAB — COMPREHENSIVE METABOLIC PANEL
ALBUMIN: 3.1 g/dL — AB (ref 3.5–5.0)
ALK PHOS: 60 U/L (ref 38–126)
AST: 9 U/L — ABNORMAL LOW (ref 15–41)
Anion gap: 7 (ref 5–15)
BUN: 25 mg/dL — ABNORMAL HIGH (ref 8–23)
CALCIUM: 8.2 mg/dL — AB (ref 8.9–10.3)
CHLORIDE: 107 mmol/L (ref 98–111)
CO2: 22 mmol/L (ref 22–32)
CREATININE: 1.82 mg/dL — AB (ref 0.61–1.24)
GFR calc Af Amer: 38 mL/min — ABNORMAL LOW (ref >60)
GFR calc non Af Amer: 33 mL/min — ABNORMAL LOW (ref >60)
GLUCOSE: 128 mg/dL — AB (ref 70–99)
Potassium: 4.2 mmol/L (ref 3.5–5.1)
SODIUM: 136 mmol/L (ref 135–145)
Total Bilirubin: 1.1 mg/dL (ref 0.3–1.2)
Total Protein: 5.5 g/dL — ABNORMAL LOW (ref 6.5–8.1)

## 2017-08-13 LAB — PROTIME-INR
INR: 1.23
Prothrombin Time: 15.4 seconds — ABNORMAL HIGH (ref 11.4–15.2)

## 2017-08-13 SURGERY — CYSTOSCOPY, WITH RETROGRADE PYELOGRAM AND URETERAL STENT INSERTION
Anesthesia: General | Site: Ureter | Laterality: Right

## 2017-08-13 MED ORDER — BRINZOLAMIDE 1 % OP SUSP
1.0000 [drp] | Freq: Three times a day (TID) | OPHTHALMIC | Status: DC
  Administered 2017-08-14 – 2017-08-17 (×10): 1 [drp] via OPHTHALMIC
  Filled 2017-08-13: qty 10

## 2017-08-13 MED ORDER — LACTATED RINGERS IV SOLN
INTRAVENOUS | Status: DC | PRN
  Administered 2017-08-13: 21:00:00 via INTRAVENOUS

## 2017-08-13 MED ORDER — LIDOCAINE 2% (20 MG/ML) 5 ML SYRINGE
INTRAMUSCULAR | Status: AC
  Filled 2017-08-13: qty 5

## 2017-08-13 MED ORDER — PROPOFOL 10 MG/ML IV BOLUS
INTRAVENOUS | Status: AC
  Filled 2017-08-13: qty 20

## 2017-08-13 MED ORDER — CARBIDOPA-LEVODOPA 25-100 MG PO TABS
1.0000 | ORAL_TABLET | Freq: Three times a day (TID) | ORAL | Status: DC
  Administered 2017-08-13 – 2017-08-17 (×11): 1 via ORAL
  Filled 2017-08-13 (×12): qty 1

## 2017-08-13 MED ORDER — BRIMONIDINE TARTRATE-TIMOLOL 0.2-0.5 % OP SOLN
1.0000 [drp] | Freq: Two times a day (BID) | OPHTHALMIC | Status: DC
  Filled 2017-08-13: qty 5

## 2017-08-13 MED ORDER — SODIUM CHLORIDE 0.9 % IV SOLN
INTRAVENOUS | Status: DC
  Administered 2017-08-13 – 2017-08-14 (×3): via INTRAVENOUS

## 2017-08-13 MED ORDER — VITAMIN D3 25 MCG (1000 UNIT) PO TABS
5000.0000 [IU] | ORAL_TABLET | Freq: Every day | ORAL | Status: DC
  Administered 2017-08-14 – 2017-08-17 (×4): 5000 [IU] via ORAL
  Filled 2017-08-13 (×4): qty 5

## 2017-08-13 MED ORDER — LEVOTHYROXINE SODIUM 100 MCG PO TABS
200.0000 ug | ORAL_TABLET | Freq: Every day | ORAL | Status: DC
  Administered 2017-08-14 – 2017-08-17 (×4): 200 ug via ORAL
  Filled 2017-08-13: qty 1
  Filled 2017-08-13 (×4): qty 2

## 2017-08-13 MED ORDER — COLESEVELAM HCL 625 MG PO TABS
1875.0000 mg | ORAL_TABLET | Freq: Every day | ORAL | Status: DC
  Administered 2017-08-14 – 2017-08-17 (×4): 1875 mg via ORAL
  Filled 2017-08-13 (×5): qty 3

## 2017-08-13 MED ORDER — LATANOPROST 0.005 % OP SOLN
1.0000 [drp] | Freq: Every day | OPHTHALMIC | Status: DC
  Administered 2017-08-14 – 2017-08-16 (×3): 1 [drp] via OPHTHALMIC
  Filled 2017-08-13: qty 2.5

## 2017-08-13 MED ORDER — FENTANYL CITRATE (PF) 100 MCG/2ML IJ SOLN
INTRAMUSCULAR | Status: AC
  Filled 2017-08-13: qty 2

## 2017-08-13 MED ORDER — LEVOFLOXACIN IN D5W 500 MG/100ML IV SOLN: 500.0000 mg | INTRAVENOUS | Status: DC

## 2017-08-13 MED ORDER — SODIUM CHLORIDE 0.9 % IV SOLN
2.0000 g | Freq: Once | INTRAVENOUS | Status: AC
  Administered 2017-08-13: 2 g via INTRAVENOUS
  Filled 2017-08-13: qty 2

## 2017-08-13 MED ORDER — LEVOFLOXACIN IN D5W 750 MG/150ML IV SOLN
750.0000 mg | Freq: Once | INTRAVENOUS | Status: AC
  Administered 2017-08-13: 750 mg via INTRAVENOUS
  Filled 2017-08-13: qty 150

## 2017-08-13 MED ORDER — DOCUSATE SODIUM 100 MG PO CAPS
100.0000 mg | ORAL_CAPSULE | Freq: Every day | ORAL | Status: DC
  Administered 2017-08-14 – 2017-08-17 (×4): 100 mg via ORAL
  Filled 2017-08-13 (×4): qty 1

## 2017-08-13 MED ORDER — POTASSIUM CHLORIDE CRYS ER 10 MEQ PO TBCR
10.0000 meq | EXTENDED_RELEASE_TABLET | Freq: Every day | ORAL | Status: DC
  Administered 2017-08-14 – 2017-08-17 (×4): 10 meq via ORAL
  Filled 2017-08-13 (×5): qty 1

## 2017-08-13 MED ORDER — TICAGRELOR 90 MG PO TABS
90.0000 mg | ORAL_TABLET | Freq: Two times a day (BID) | ORAL | Status: DC
  Administered 2017-08-13 – 2017-08-17 (×8): 90 mg via ORAL
  Filled 2017-08-13 (×8): qty 1

## 2017-08-13 MED ORDER — MIRABEGRON ER 25 MG PO TB24
50.0000 mg | ORAL_TABLET | Freq: Every day | ORAL | Status: DC
  Administered 2017-08-14 – 2017-08-17 (×4): 50 mg via ORAL
  Filled 2017-08-13 (×4): qty 2

## 2017-08-13 MED ORDER — ESMOLOL HCL 100 MG/10ML IV SOLN
INTRAVENOUS | Status: AC
  Filled 2017-08-13: qty 10

## 2017-08-13 MED ORDER — SODIUM CHLORIDE 0.9 % IV SOLN
1.0000 g | Freq: Three times a day (TID) | INTRAVENOUS | Status: DC
  Administered 2017-08-13 – 2017-08-14 (×2): 1 g via INTRAVENOUS
  Filled 2017-08-13 (×4): qty 1

## 2017-08-13 MED ORDER — BRIMONIDINE TARTRATE 0.2 % OP SOLN
1.0000 [drp] | Freq: Two times a day (BID) | OPHTHALMIC | Status: DC
  Administered 2017-08-14 – 2017-08-17 (×7): 1 [drp] via OPHTHALMIC
  Filled 2017-08-13: qty 5

## 2017-08-13 MED ORDER — VANCOMYCIN HCL IN DEXTROSE 1-5 GM/200ML-% IV SOLN
1000.0000 mg | Freq: Once | INTRAVENOUS | Status: AC
  Administered 2017-08-13: 1000 mg via INTRAVENOUS
  Filled 2017-08-13: qty 200

## 2017-08-13 MED ORDER — ALFUZOSIN HCL ER 10 MG PO TB24
10.0000 mg | ORAL_TABLET | Freq: Every day | ORAL | Status: DC
  Administered 2017-08-14 – 2017-08-16 (×3): 10 mg via ORAL
  Filled 2017-08-13 (×4): qty 1

## 2017-08-13 MED ORDER — SODIUM CHLORIDE 0.9 % IV BOLUS
1000.0000 mL | Freq: Once | INTRAVENOUS | Status: AC
  Administered 2017-08-13: 1000 mL via INTRAVENOUS

## 2017-08-13 MED ORDER — GABAPENTIN 100 MG PO CAPS
100.0000 mg | ORAL_CAPSULE | Freq: Every day | ORAL | Status: DC
  Administered 2017-08-14 – 2017-08-16 (×3): 100 mg via ORAL
  Filled 2017-08-13 (×3): qty 1

## 2017-08-13 MED ORDER — LIDOCAINE HCL URETHRAL/MUCOSAL 2 % EX GEL
CUTANEOUS | Status: AC
  Filled 2017-08-13: qty 5

## 2017-08-13 MED ORDER — PANTOPRAZOLE SODIUM 40 MG PO TBEC
80.0000 mg | DELAYED_RELEASE_TABLET | Freq: Every day | ORAL | Status: DC
  Administered 2017-08-14 – 2017-08-17 (×4): 80 mg via ORAL
  Filled 2017-08-13 (×4): qty 2

## 2017-08-13 MED ORDER — FENTANYL CITRATE (PF) 100 MCG/2ML IJ SOLN: 25.0000 ug | INTRAMUSCULAR | Status: DC | PRN

## 2017-08-13 MED ORDER — ACETAMINOPHEN 325 MG PO TABS
650.0000 mg | ORAL_TABLET | Freq: Four times a day (QID) | ORAL | Status: DC | PRN
  Administered 2017-08-13 – 2017-08-15 (×2): 650 mg via ORAL
  Filled 2017-08-13 (×2): qty 2

## 2017-08-13 MED ORDER — ALBUTEROL SULFATE (2.5 MG/3ML) 0.083% IN NEBU: 3.0000 mL | INHALATION_SOLUTION | Freq: Three times a day (TID) | RESPIRATORY_TRACT | Status: DC | PRN

## 2017-08-13 MED ORDER — ONDANSETRON HCL 4 MG/2ML IJ SOLN: 4.0000 mg | Freq: Once | INTRAMUSCULAR | Status: DC | PRN

## 2017-08-13 MED ORDER — ONDANSETRON HCL 4 MG/2ML IJ SOLN
INTRAMUSCULAR | Status: DC | PRN
  Administered 2017-08-13: 4 mg via INTRAVENOUS

## 2017-08-13 MED ORDER — METOPROLOL SUCCINATE ER 25 MG PO TB24
25.0000 mg | ORAL_TABLET | Freq: Every day | ORAL | Status: DC
  Administered 2017-08-14 – 2017-08-17 (×4): 25 mg via ORAL
  Filled 2017-08-13 (×4): qty 1

## 2017-08-13 MED ORDER — ASPIRIN EC 81 MG PO TBEC
81.0000 mg | DELAYED_RELEASE_TABLET | Freq: Every day | ORAL | Status: DC
  Administered 2017-08-14 – 2017-08-17 (×4): 81 mg via ORAL
  Filled 2017-08-13 (×4): qty 1

## 2017-08-13 MED ORDER — ONDANSETRON HCL 4 MG/2ML IJ SOLN
INTRAMUSCULAR | Status: AC
  Filled 2017-08-13: qty 2

## 2017-08-13 MED ORDER — PHENYLEPHRINE 40 MCG/ML (10ML) SYRINGE FOR IV PUSH (FOR BLOOD PRESSURE SUPPORT)
PREFILLED_SYRINGE | INTRAVENOUS | Status: AC
  Filled 2017-08-13: qty 10

## 2017-08-13 MED ORDER — FENTANYL CITRATE (PF) 100 MCG/2ML IJ SOLN
INTRAMUSCULAR | Status: DC | PRN
  Administered 2017-08-13 (×2): 50 ug via INTRAVENOUS

## 2017-08-13 MED ORDER — DEXAMETHASONE SODIUM PHOSPHATE 10 MG/ML IJ SOLN
INTRAMUSCULAR | Status: DC | PRN
  Administered 2017-08-13: 10 mg via INTRAVENOUS

## 2017-08-13 MED ORDER — VANCOMYCIN HCL IN DEXTROSE 750-5 MG/150ML-% IV SOLN
750.0000 mg | INTRAVENOUS | Status: DC
  Filled 2017-08-13: qty 150

## 2017-08-13 MED ORDER — TIMOLOL MALEATE 0.5 % OP SOLN
1.0000 [drp] | Freq: Two times a day (BID) | OPHTHALMIC | Status: DC
  Administered 2017-08-14 – 2017-08-17 (×7): 1 [drp] via OPHTHALMIC
  Filled 2017-08-13: qty 5

## 2017-08-13 MED ORDER — DEXAMETHASONE SODIUM PHOSPHATE 10 MG/ML IJ SOLN
INTRAMUSCULAR | Status: AC
  Filled 2017-08-13: qty 1

## 2017-08-13 MED ORDER — ATROPINE SULFATE 1 % OP SOLN
1.0000 [drp] | Freq: Every day | OPHTHALMIC | Status: DC
  Administered 2017-08-14 – 2017-08-17 (×4): 1 [drp] via OPHTHALMIC
  Filled 2017-08-13: qty 2

## 2017-08-13 MED ORDER — PROPOFOL 10 MG/ML IV BOLUS
INTRAVENOUS | Status: DC | PRN
  Administered 2017-08-13: 100 mg via INTRAVENOUS

## 2017-08-13 MED ORDER — LIDOCAINE 2% (20 MG/ML) 5 ML SYRINGE
INTRAMUSCULAR | Status: DC | PRN
  Administered 2017-08-13: 60 mg via INTRAVENOUS

## 2017-08-13 SURGICAL SUPPLY — 16 items
BAG URINE DRAINAGE (UROLOGICAL SUPPLIES) ×1 IMPLANT
BAG URO CATCHER STRL LF (MISCELLANEOUS) ×2 IMPLANT
CATH FOLEY 2WAY 5CC 16FR (CATHETERS) ×2
CATH INTERMIT  6FR 70CM (CATHETERS) ×2 IMPLANT
CATH URTH STD 16FR FL 2W DRN (CATHETERS) IMPLANT
CLOTH BEACON ORANGE TIMEOUT ST (SAFETY) ×2 IMPLANT
GLOVE BIOGEL M STRL SZ7.5 (GLOVE) ×2 IMPLANT
GOWN STRL REUS W/TWL LRG LVL3 (GOWN DISPOSABLE) ×4 IMPLANT
GUIDEWIRE STR DUAL SENSOR (WIRE) ×2 IMPLANT
GUIDEWIRE ZIPWRE .038 STRAIGHT (WIRE) ×1 IMPLANT
IV NS IRRIG 3000ML ARTHROMATIC (IV SOLUTION) ×1 IMPLANT
MANIFOLD NEPTUNE II (INSTRUMENTS) ×2 IMPLANT
PACK CYSTO (CUSTOM PROCEDURE TRAY) ×2 IMPLANT
STENT URET 6FRX26 CONTOUR (STENTS) ×1 IMPLANT
SYR 10ML LL (SYRINGE) ×1 IMPLANT
TUBING CONNECTING 10 (TUBING) ×2 IMPLANT

## 2017-08-13 NOTE — ED Notes (Addendum)
Pt not in room.

## 2017-08-13 NOTE — Anesthesia Procedure Notes (Signed)
Procedure Name: LMA Insertion Date/Time: 08/13/2017 9:38 PM Performed by: Talbot Grumbling, CRNA Pre-anesthesia Checklist: Patient identified, Emergency Drugs available, Suction available and Patient being monitored Patient Re-evaluated:Patient Re-evaluated prior to induction Oxygen Delivery Method: Circle system utilized Preoxygenation: Pre-oxygenation with 100% oxygen Induction Type: IV induction Ventilation: Mask ventilation without difficulty LMA: LMA inserted LMA Size: 4.0 Number of attempts: 1 Placement Confirmation: positive ETCO2 and breath sounds checked- equal and bilateral Tube secured with: Tape Dental Injury: Teeth and Oropharynx as per pre-operative assessment

## 2017-08-13 NOTE — Consult Note (Signed)
Urology Consult   Physician requesting consult: Dr. Daryll Drown  Reason for consult: Right ureteral calculi and fever  History of Present Illness: [name] is a 84 y.o. who presented with fever up to 102.  He underwent a CT scan that demonstrated multiple right distal ureteral calculi.  He did have a NSTEMI last week and is s/p cardiac stent placement and is currently on dual platelet inhibition.  He has a long standing history of kidney stones and is followed by Dr. Alyson Ingles.    Past Medical History:  Diagnosis Date  . Allergic rhinitis   . Arthritis    arthritis-kyphosis  . Asthmatic bronchitis   . Chronic kidney disease   . DM type 2 (diabetes mellitus, type 2) ([place])   . DVT, lower extremity, proximal, acute ([place])   . Dyslipidemia   . Dysrhythmia    '97 tachycardia- no recent issues  . GERD (gastroesophageal reflux disease)   . Hearing impaired    bilateral hearing aids  . History of kidney stones   . History of skin cancer   . Hypertension   . Hypothyroidism   . Macular degeneration    injections done bilaterally recent - 08-22-13  . Prostate cancer [facility])    Prostate cancer-radiation only,skin cancer-basal cell and last squamous cell right eye  . Stroke ([place])   . Urgency-frequency syndrome   . Urticaria     Past Surgical History:  Procedure Laterality Date  . BACK SURGERY     lumbar fracture  . cataract surgery Bilateral   . COLONOSCOPY W/ POLYPECTOMY     x2   . CORONARY STENT INTERVENTION N/A 08/06/2017   Procedure: CORONARY STENT INTERVENTION;  Surgeon: Jettie Booze, MD;  Location: [place] [facility];  Service: Cardiovascular;  Laterality: N/A;  . CYSTOSCOPY WITH INJECTION N/A 09/11/2013   Procedure: CYSTOSCOPY WITH BOTOX INJECTION INTO THE BLADDER;  Surgeon: Ailene Rud, MD;  Location: [facility];  Service: Urology;  Laterality: N/A;  . CYSTOSCOPY WITH RETROGRADE PYELOGRAM, URETEROSCOPY AND STENT PLACEMENT    . CYSTOSCOPY WITH STENT PLACEMENT  Right 01/26/2016   Procedure: CYSTOSCOPY, RIGHT RETROGRADE WITH RIGHT URETERAL STENT PLACEMENT;  Surgeon: Cleon Gustin, MD;  Location: [facility];  Service: Urology;  Laterality: Right;  . CYSTOSCOPY/RETROGRADE/URETEROSCOPY Right 04/22/2016   Procedure: CYSTOSCOPY/RETROGRADE/ REMOVAL JJ STENT right insertion stent right insertion of foley;  Surgeon: Franchot Gallo, MD;  Location: [facility];  Service: Urology;  Laterality: Right;  . EXTRACORPOREAL SHOCK WAVE LITHOTRIPSY Right 02/27/2016   Procedure: RIGHT EXTRACORPOREAL SHOCK WAVE LITHOTRIPSY (ESWL) GAITED;  Surgeon: Cleon Gustin, MD;  Location: [facility];  Service: Urology;  Laterality: Right;  . EXTRACORPOREAL SHOCK WAVE LITHOTRIPSY Right 02/10/2016   Procedure: EXTRACORPOREAL SHOCK WAVE LITHOTRIPSY (ESWL);  Surgeon: Kathie Rhodes, MD;  Location: [facility];  Service: Urology;  Laterality: Right;  [phone] [id number] [employer]- [id number]   . EYE SURGERY     macular degeneration  . GALLBLADDER SURGERY  2006   no cholecystectomy  . HERNIA REPAIR    . LEFT HEART CATH AND CORONARY ANGIOGRAPHY N/A 08/06/2017   Procedure: LEFT HEART CATH AND CORONARY ANGIOGRAPHY;  Surgeon: Jettie Booze, MD;  Location: [place] [facility];  Service: Cardiovascular;  Laterality: N/A;  . LUNG REMOVAL, PARTIAL  age 40   for bronchiectasis  . PARTIAL THYMECTOMY  1985   for nodules  . POPLITEAL SYNOVIAL CYST EXCISION  2005  . TOTAL KNEE ARTHROPLASTY  2008   right  .  TOTAL KNEE ARTHROPLASTY  2010   left    Current Hospital Medications:  Home Meds:  Current Meds  Medication Sig  . albuterol (PROAIR HFA) 108 (90 Base) MCG/ACT inhaler Inhale 2 puffs into the lungs 3 (three) times daily as needed for wheezing or shortness of breath.  . alfuzosin (UROXATRAL) 10 MG 24 hr tablet Take 1 tablet (10 mg total) by mouth at bedtime.  Marland Kitchen aspirin EC 81 MG tablet Take 81 mg by mouth daily.   Marland Kitchen atropine 1 % ophthalmic solution Place 1 drop  into the right eye daily at 6pm for 14 days.  . brimonidine-timolol (COMBIGAN) 0.2-0.5 % ophthalmic solution Place 1 drop into both eyes 2 times daily.  . brinzolamide (AZOPT) 1 % ophthalmic suspension Place 1 drop into the right eye 3 times daily.  . carbidopa-levodopa (SINEMET IR) 25-100 MG tablet Take 1 tablet by mouth 3 (three) times daily.   . Cholecalciferol (VITAMIN D-3) 5000 units TABS Take 5,000 Units by mouth daily.  . colesevelam (WELCHOL) 625 MG tablet Take 1,875 mg by mouth daily.   Marland Kitchen docusate sodium (COLACE) 100 MG capsule Take 100 mg by mouth daily.  . fexofenadine (ALLEGRA) 180 MG tablet Take 180 mg by mouth daily.  Marland Kitchen gabapentin (NEURONTIN) 100 MG capsule Take 100 mg by mouth at bedtime.  Marland Kitchen guaiFENesin (MUCINEX) 600 MG 12 hr tablet Take by mouth 2 (two) times daily.  . hydroxypropyl methylcellulose / hypromellose (ISOPTO TEARS / GONIOVISC) 2.5 % ophthalmic solution Place 1 drop into both eyes 4 (four) times daily.  Marland Kitchen latanoprost (XALATAN) 0.005 % ophthalmic solution Place 1 drop into the right eye daily.  Marland Kitchen levothyroxine (SYNTHROID, LEVOTHROID) 200 MCG tablet Take 200 mcg by mouth daily.   . Meth-Hyo-M Bl-Na Phos-Ph Sal (URIBEL) 118 MG CAPS Take 1 capsule (118 mg total) by mouth 3 (three) times daily as needed (burning). (Patient taking differently: Take 1 capsule by mouth 3 (three) times daily as needed (for burning). )  . metoprolol succinate (TOPROL-XL) 25 MG 24 hr tablet Take 1 tablet (25 mg total) by mouth daily.  . mirabegron ER (MYRBETRIQ) 50 MG TB24 tablet Take 1 tablet (50 mg total) by mouth daily.  . Multiple Vitamins-Minerals (ICAPS) TABS Take 1 tablet by mouth daily.  Marland Kitchen omeprazole (PRILOSEC) 40 MG capsule Take 40 mg by mouth daily.  . polyethylene glycol (MIRALAX / GLYCOLAX) packet Take 17 g by mouth daily.  . potassium chloride (K-DUR,KLOR-CON) 10 MEQ tablet Take 10 mEq by mouth daily.  . sitaGLIPtin (JANUVIA) 50 MG tablet Take 1 tablet (50 mg total) by mouth daily.   . ticagrelor (BRILINTA) 90 MG TABS tablet Take 1 tablet (90 mg total) by mouth 2 (two) times daily.  . traMADol (ULTRAM) 50 MG tablet Take 25 mg by mouth 2 (two) times daily.  Marland Kitchen zolpidem (AMBIEN) 5 MG tablet Take 0.5 tablets (2.5 mg total) by mouth at bedtime as needed for sleep.    Scheduled Meds: . alfuzosin  10 mg Oral QHS  . [START ON 08/14/2017] aspirin EC  81 mg Oral Daily  . atropine  1 drop Right Eye Daily  . brimonidine-timolol  1 drop Both Eyes BID  . brinzolamide  1 drop Right Eye TID  . carbidopa-levodopa  1 tablet Oral TID  . [START ON 08/14/2017] cholecalciferol  5,000 Units Oral Daily  . colesevelam  1,875 mg Oral Daily  . [START ON 08/14/2017] docusate sodium  100 mg Oral Daily  . gabapentin  100 mg  Oral QHS  . latanoprost  1 drop Right Eye QHS  . [START ON 08/14/2017] levothyroxine  200 mcg Oral QAC breakfast  . [START ON 08/14/2017] metoprolol succinate  25 mg Oral Daily  . [START ON 08/14/2017] mirabegron ER  50 mg Oral Daily  . [START ON 08/14/2017] pantoprazole  80 mg Oral Daily  . [START ON 08/14/2017] potassium chloride  10 mEq Oral Daily  . ticagrelor  90 mg Oral BID   Continuous Infusions: . sodium chloride 75 mL/hr at 08/13/17 1758  . aztreonam 1 g (08/13/17 1914)  . [START ON 08/15/2017] levofloxacin (LEVAQUIN) IV    . [START ON 08/14/2017] vancomycin     PRN Meds:.acetaminophen, albuterol  Allergies:  Allergies  Allergen Reactions  . Colchicine Anaphylaxis  . Hibiclens [Chlorhexidine] Hives  . Atorvastatin Other (See Comments)    Reaction:  Joint pain   . Ceftin Diarrhea  . Celebrex [Celecoxib] Swelling  . Codeine Hives  . Contrast Media [Iodinated Diagnostic Agents] Nausea And Vomiting and Rash  . Erythromycin Diarrhea  . Ezetimibe Other (See Comments)    Reaction:  Joint pain   . Oxycodone-Acetaminophen Hives  . Rosuvastatin Other (See Comments)    Reaction:  Joint pain   . Simvastatin Other (See Comments)    Reaction:  Joint pain   . Cefpodoxime Rash   . Cefuroxime Axetil Rash  . Nitroglycerin Other (See Comments)    Reaction:  Lowers pts BP  . Tetanus Toxoid Rash  . Vantin Other (See Comments)    Reaction:  Unknown     Family History  Problem Relation Age of Onset  . Other Sister        ophth disease  . Heart attack Father        x7  . Heart disease Father        MI x7  . Hypertension Mother   . Stroke Mother 88  . Amblyopia Neg Hx   . Blindness Neg Hx   . Glaucoma Neg Hx   . Macular degeneration Neg Hx   . Retinal detachment Neg Hx   . Strabismus Neg Hx   . Retinitis pigmentosa Neg Hx   . Cataracts Neg Hx     Social History:  reports that he has never smoked. He has never used smokeless tobacco. He reports that he does not drink alcohol or use drugs.  ROS: A complete review of systems was performed.  All systems are negative except for pertinent findings as noted.  Physical Exam:  Vital signs in last 24 hours: Temp:  [98.3 F (36.8 C)-102.2 F (39 C)] 99.4 F (37.4 C) (08/02 1957) Pulse Rate:  [68-91] 72 (08/02 1957) Resp:  [18-29] 26 (08/02 1957) BP: (114-173)/(63-102) 142/75 (08/02 1957) SpO2:  [94 %-100 %] 95 % (08/02 1957) Weight:  [64.9 kg (143 lb)] 64.9 kg (143 lb) (08/02 0918) Constitutional:  Alert and oriented, No acute distress Cardiovascular: Regular rate and rhythm, No JVD Respiratory: Normal respiratory effort GI: Abdomen is soft, nontender, nondistended, no abdominal masses GU: No CVA tenderness Lymphatic: No lymphadenopathy Neurologic: Grossly intact, no focal deficits Psychiatric: Normal mood and affect  Laboratory Data:  Recent Labs    08/13/17 0959  WBC 9.8  HGB 11.0*  HCT 34.4*  PLT 157   CBC Latest Ref Rng & Units 08/13/2017 08/07/2017 08/06/2017  WBC 4.0 - 10.5 K/uL 9.8 9.9 6.3  Hemoglobin 13.0 - 17.0 g/dL 11.0(L) 12.3(L) 12.7(L)  Hematocrit 39.0 - 52.0 % 34.4(L) 37.2(L) 39.2  Platelets 150 - 400 K/uL 157 214 199      Recent Labs    08/13/17 0959  NA 136  K 4.2  CL 107   GLUCOSE 128*  BUN 25*  CALCIUM 8.2*  CREATININE 1.82*     Results for orders placed or performed during the hospital encounter of 08/13/17 (from the past 24 hour(s))  Urinalysis, Routine w reflex microscopic     Status: Abnormal   Collection Time: 08/13/17  9:37 AM  Result Value Ref Range   Color, Urine GREEN (A) YELLOW   APPearance HAZY (A) CLEAR   Specific Gravity, Urine 1.020 1.005 - 1.030   pH 7.0 5.0 - 8.0   Glucose, UA NEGATIVE NEGATIVE mg/dL   Hgb urine dipstick TRACE (A) NEGATIVE   Bilirubin Urine NEGATIVE NEGATIVE   Ketones, ur NEGATIVE NEGATIVE mg/dL   Protein, ur 100 (A) NEGATIVE mg/dL   Nitrite NEGATIVE NEGATIVE   Leukocytes, UA TRACE (A) NEGATIVE  Urinalysis, Microscopic (reflex)     Status: Abnormal   Collection Time: 08/13/17  9:37 AM  Result Value Ref Range   RBC / HPF 0-5 0 - 5 RBC/hpf   WBC, UA 21-50 0 - 5 WBC/hpf   Bacteria, UA MANY (A) NONE SEEN   Squamous Epithelial / LPF NONE SEEN 0 - 5   Non Squamous Epithelial PRESENT (A) NONE SEEN   Mucus PRESENT    Granular Casts, UA PRESENT   Comprehensive metabolic panel     Status: Abnormal   Collection Time: 08/13/17  9:59 AM  Result Value Ref Range   Sodium 136 135 - 145 mmol/L   Potassium 4.2 3.5 - 5.1 mmol/L   Chloride 107 98 - 111 mmol/L   CO2 22 22 - 32 mmol/L   Glucose, Bld 128 (H) 70 - 99 mg/dL   BUN 25 (H) 8 - 23 mg/dL   Creatinine, Ser 1.82 (H) 0.61 - 1.24 mg/dL   Calcium 8.2 (L) 8.9 - 10.3 mg/dL   Total Protein 5.5 (L) 6.5 - 8.1 g/dL   Albumin 3.1 (L) 3.5 - 5.0 g/dL   AST 9 (L) 15 - 41 U/L   ALT <5 0 - 44 U/L   Alkaline Phosphatase 60 38 - 126 U/L   Total Bilirubin 1.1 0.3 - 1.2 mg/dL   GFR calc non Af Amer 33 (L) >60 mL/min   GFR calc Af Amer 38 (L) >60 mL/min   Anion gap 7 5 - 15  CBC with Differential     Status: Abnormal   Collection Time: 08/13/17  9:59 AM  Result Value Ref Range   WBC 9.8 4.0 - 10.5 K/uL   RBC 3.76 (L) 4.22 - 5.81 MIL/uL   Hemoglobin 11.0 (L) 13.0 - 17.0 g/dL    HCT 34.4 (L) 39.0 - 52.0 %   MCV 91.5 78.0 - 100.0 fL   MCH 29.3 26.0 - 34.0 pg   MCHC 32.0 30.0 - 36.0 g/dL   RDW 13.3 11.5 - 15.5 %   Platelets 157 150 - 400 K/uL   Neutrophils Relative % 85 %   Neutro Abs 8.3 (H) 1.7 - 7.7 K/uL   Lymphocytes Relative 5 %   Lymphs Abs 0.5 (L) 0.7 - 4.0 K/uL   Monocytes Relative 9 %   Monocytes Absolute 0.9 0.1 - 1.0 K/uL   Eosinophils Relative 0 %   Eosinophils Absolute 0.0 0.0 - 0.7 K/uL   Basophils Relative 0 %   Basophils Absolute 0.0 0.0 - 0.1 K/uL  Immature Granulocytes 1 %   Abs Immature Granulocytes 0.1 0.0 - 0.1 K/uL  Protime-INR     Status: Abnormal   Collection Time: 08/13/17  9:59 AM  Result Value Ref Range   Prothrombin Time 15.4 (H) 11.4 - 15.2 seconds   INR 1.23   I-Stat CG4 Lactic Acid, ED     Status: None   Collection Time: 08/13/17 10:15 AM  Result Value Ref Range   Lactic Acid, Venous 0.76 0.5 - 1.9 mmol/L  I-Stat CG4 Lactic Acid, ED     Status: None   Collection Time: 08/13/17 11:57 AM  Result Value Ref Range   Lactic Acid, Venous 0.66 0.5 - 1.9 mmol/L   Recent Results (from the past 240 hour(s))  MRSA PCR Screening     Status: None   Collection Time: 08/04/17  1:00 AM  Result Value Ref Range Status   MRSA by PCR NEGATIVE NEGATIVE Final    Comment:        The GeneXpert MRSA Assay (FDA approved for NASAL specimens only), is one component of a comprehensive MRSA colonization surveillance program. It is not intended to diagnose MRSA infection nor to guide or monitor treatment for MRSA infections. Performed at [facility], [place] [address], [place], [place] [address]     Renal Function: Recent Labs    08/07/17 0226 08/07/17 1132 08/08/17 0308 08/13/17 0959  CREATININE 1.52* 1.62* 1.42* 1.82*   Estimated Creatinine Clearance: 28.2 mL/min (A) (by C-G formula based on SCr of 1.82 mg/dL (H)).  Radiologic Imaging: Ct Hip Left Wo Contrast  Result Date: 08/13/2017 CLINICAL DATA:  Left hip and groin pain.  EXAM: CT OF THE LEFT HIP WITHOUT CONTRAST TECHNIQUE: Multidetector CT imaging of the left hip was performed according to the standard protocol. Multiplanar CT image reconstructions were also generated. COMPARISON:  CT scan 01/25/2016 FINDINGS: Moderate to advanced left hip joint degenerative changes but no obvious fracture or AVN. Pubic symphysis and SI joint degenerative changes with chondrocalcinosis but no fracture of the left hemipelvis is identified. The surrounding left hip and pelvic musculature are grossly normal. No hematoma or abnormal fluid collection. No inguinal mass or inguinal adenopathy.  No inguinal hernia. Advanced facet disease is noted in the lower lumbar spine but no pars defects. IMPRESSION: 1. Moderate to advanced left hip joint degenerative changes but no fracture or AVN. 2. Grossly normal CT appearance of the left hip and pelvic musculature. 3. Lower lumbar facet disease along with left SI joint and pubic symphysis degenerative changes but no definite pelvic fractures or pelvic bone lesion. Electronically Signed   By: Marijo Sanes M.D.   On: 08/13/2017 11:52   Dg Chest Port 1 View  Result Date: 08/13/2017 CLINICAL DATA:  Fever, weakness, shaking this morning, LEFT leg numbness, history asthmatic bronchitis, type II diabetes mellitus, hypertension, prostate cancer EXAM: PORTABLE CHEST 1 VIEW COMPARISON:  Portable exam 1013 hours compared to 08/03/2017 FINDINGS: Borderline enlargement of cardiac silhouette. Slight pulmonary vascular congestion. Atherosclerotic calcifications aorta. Bibasilar atelectasis. Chronic reticulonodular interstitial changes at LEFT base unchanged. No acute infiltrate, pleural effusion or pneumothorax. Bones demineralized. IMPRESSION: Bibasilar atelectasis and chronic LEFT basilar reticulonodular interstitial disease. No acute abnormalities. Electronically Signed   By: Lavonia Dana M.D.   On: 08/13/2017 10:25   Ct Renal Stone Study  Result Date:  08/13/2017 CLINICAL DATA:  Fever and abdominal pain. EXAM: CT ABDOMEN AND PELVIS WITHOUT CONTRAST TECHNIQUE: Multidetector CT imaging of the abdomen and pelvis was performed following the  standard protocol without IV contrast. COMPARISON:  CT scan 01/25/2016 FINDINGS: Lower chest: The lung bases are grossly clear. Minimal dependent bibasilar atelectasis. Numerous small calcifications are noted at both lung bases, left greater than right possibly due to prior aspiration, remote infection or pulmonary alveolar microlithiasis. The heart is borderline enlarged but stable. Stable aortic and coronary artery calcifications. Large hiatal hernia is stable. Hepatobiliary: No focal hepatic lesions or intrahepatic biliary dilatation. The gallbladder is mildly distended and there are several gallstones noted. No definite CT findings for acute cholecystitis. No significant common bile duct dilatation. Pancreas: Moderate atrophy. Stable small cystic areas. No ductal dilatation. Spleen: Normal size.  No focal lesions. Adrenals/Urinary Tract: The adrenal glands are unremarkable and stable. Small calcifications are noted. Numerous bilateral renal cysts some complicated by rim-like calcifications. No worrisome renal lesions. Bilateral renal calculi. Moderate to marked right-sided hydroureteronephrosis with 2 distal right ureteral calculi noted. The more proximal calculus measures 6.5 x 5.0 mm and the more distal calculus near the UVJ measures 6.0 x 5.0 mm. No left-sided ureteral calculi or hydroureteronephrosis. No bladder mass or bladder calculi. Stomach/Bowel: The stomach, duodenum, small bowel and colon are grossly normal without oral contrast. No obvious acute inflammatory process, mass lesion or obstructive findings. Vascular/Lymphatic: Advanced atherosclerotic calcifications involving the aorta and branch vessels without focal aneurysm. No mesenteric or retroperitoneal mass or adenopathy. Small scattered lymph nodes are stable.  Reproductive: The prostate gland and seminal vesicles are unremarkable. Other: Small amount of free pelvic fluid noted on the right side. No pelvic mass or pelvic adenopathy. No inguinal mass or adenopathy. Musculoskeletal: No significant bony findings. Advanced degenerative changes involving the spine. No hip or pelvic fractures are identified. No worrisome bone lesions. Stable mild compression deformity of L1. IMPRESSION: 1. Moderate to marked right-sided hydroureteronephrosis down to 2 distal right ureteral calculi both measuring approximately 6 x 5 mm. 2. Bilateral renal calculi and multiple bilateral renal cysts. 3. Cholelithiasis without definite CT findings for acute cholecystitis. 4. Advanced atherosclerotic calcifications involving the aorta and branch vessels. 5. Large hiatal hernia. 6. No significant/acute bony findings. Electronically Signed   By: Marijo Sanes M.D.   On: 08/13/2017 12:11    I independently reviewed the above imaging studies.  Impression/Recommendation  Right ureteral calculi and fever:  Cultures have been sent and has been started on IV antibiotic therapy. He does need urgent renal drainage.  Although it would preferable to avoid the operating room considering his recent MI, he is on dual platelet inhibition and cannot safely undergo percutaneous nephrostomy placement.  I therefore recommended cystoscopy and right ureteral stent placement. I discussed the potential benefits and risks of the procedure, side effects of the proposed treatment, the likelihood of the patient achieving the goals of the procedure, and any potential problems that might occur during the procedure or recuperation. He gives informed consent to proceed.  Versia Mignogna,LES 08/13/2017, 8:24 PM    Pryor Curia MD   CC: Dr. Daryll Drown

## 2017-08-13 NOTE — ED Notes (Signed)
Called pharmacy to verify metoprolol

## 2017-08-13 NOTE — Op Note (Signed)
Preoperative diagnosis:  1. Right ureteral stones and fever   Postoperative diagnosis:  1. Right ureteral stones and fever   Procedure:  1. Cystoscopy 2. Right ureteral stent placement (6 x 26 - no string)  Surgeon: Roxy Horseman, Brooke Bonito. M.D.  Anesthesia: General  Complications: None  EBL: Minimal  Specimens: None  Indication: [name] is a 84 y.o. patient with right ureteral calculi and fever. After reviewing the management options for treatment, he elected to proceed with the above surgical procedure(s). We have discussed the potential benefits and risks of the procedure, side effects of the proposed treatment, the likelihood of the patient achieving the goals of the procedure, and any potential problems that might occur during the procedure or recuperation. Informed consent has been obtained.  Description of procedure:  The patient was taken to the operating room and general anesthesia was induced.  The patient was placed in the dorsal lithotomy position, prepped and draped in the usual sterile fashion, and preoperative antibiotics were administered. A preoperative time-out was performed.   Cystourethroscopy was performed.  The patient's urethra was examined and was normal. The bladder was then systematically examined in its entirety. There was no evidence for any bladder tumors, stones, or other mucosal pathology.    Attention then turned to the right ureteral orifice and a ureteral catheter was used to intubate the ureteral orifice.  Omnipaque contrast was injected through the ureteral catheter and a retrograde pyelogram was performed with findings as dictated above.  A 0.38 sensor guidewire was then attempted to be advanced up the right ureter.  There was resistance and a glidewire was then used to bypass the right ureteral stone. I then placed a right ureteral catheter over the glidewire.  A 0.38 sensor guidewire was then exchanged in place of the glidewire and placed into  the renal pelvis under fluoroscopic guidance.  The wire was then backloaded through the cystoscope and a ureteral stent was advance over the wire using Seldinger technique.  The stent was positioned appropriately under fluoroscopic and cystoscopic guidance.  The wire was then removed with an adequate stent curl noted in the renal pelvis as well as in the bladder.  The bladder was then emptied and the procedure ended.  The patient appeared to tolerate the procedure well and without complications.  The patient was able to be awakened and transferred to the recovery unit in satisfactory condition.    Pryor Curia MD

## 2017-08-13 NOTE — ED Provider Notes (Addendum)
[facility] EMERGENCY DEPARTMENT Provider Note   CSN: [id number] Arrival date & time: 08/13/17  0910     History   Chief Complaint Chief Complaint  Patient presents with  . Groin Pain  . Fever    HPI [name] is a 84 y.o. male.  HPI 84 year old male discharged 728 after admission for an STEMI with known coronary artery disease and CHF who presents today with complaints of left-sided groin pain, fever, and generalized weakness.  Patient is residing in a nursing home.  History from patient is limited due to vision's ability to give details of complaint.  Per [facility], patient was febrile yesterday and was given Tylenol which did not resolve his fever.  He is febrile again to 101.2 and complaining of left groin pain with movement.  Staff states that patient is DNR.  Nobody is at bedside to give further history. Past Medical History:  Diagnosis Date  . Allergic rhinitis   . Arthritis    arthritis-kyphosis  . Asthmatic bronchitis   . Chronic kidney disease   . DM type 2 (diabetes mellitus, type 2) ([place])   . DVT, lower extremity, proximal, acute ([place])   . Dyslipidemia   . Dysrhythmia    '97 tachycardia- no recent issues  . GERD (gastroesophageal reflux disease)   . Hearing impaired    bilateral hearing aids  . History of kidney stones   . History of skin cancer   . Hypertension   . Hypothyroidism   . Macular degeneration    injections done bilaterally recent - 08-22-13  . Prostate cancer [facility])    Prostate cancer-radiation only,skin cancer-basal cell and last squamous cell right eye  . Stroke ([place])   . Urgency-frequency syndrome   . Urticaria     Patient Active Problem List   Diagnosis Date Noted  . Non-ST elevation (NSTEMI) myocardial infarction ([place])   . GERD (gastroesophageal reflux disease) 08/04/2017  . Elevated troponin 08/03/2017  . Chest pain 08/03/2017  . CKD (chronic kidney disease), stage III ([place]) 08/03/2017  . Type  II diabetes mellitus with renal manifestations ([place]) 08/03/2017  . Pseudophakia of both eyes 05/31/2017  . Retained ureteral stent 04/22/2016  . Ureteral calculus 01/25/2016  . Mental status change 10/24/2015  . Encephalopathy 10/24/2015  . Hypothermia 10/24/2015  . Chronic renal insufficiency 10/24/2015  . Benign essential HTN 10/24/2015  . Hypoglycemia 10/24/2015  . Acute encephalopathy 10/24/2015  . CVA (cerebral vascular accident) ([place]) 10/14/2014  . Prerenal azotemia 10/14/2014  . Mild HTN 10/14/2014  . CVA (cerebral infarction)   . TIA (transient ischemic attack)   . Diabetes mellitus type 2 without retinopathy ([place]) 03/27/2014  . CME (cystoid macular edema), left 08/10/2013  . PNAR (perennial non-allergic rhinitis) 05/23/2013  . Squamous cell carcinoma of eyelid 05/15/2013  . Squamous cell carcinoma 04/06/2013  . Blepharitis 03/16/2013  . Eyelid lesion 03/16/2013  . Seasonal and perennial allergic rhinitis 06/11/2012  . Posterior vitreous detachment of right eye 10/23/2011  . Exudative age-related macular degeneration ([place]) 07/28/2011  . Nonproliferative diabetic retinopathy ([place]) 04/07/2011  . Acute bronchitis due to infection 01/14/2011  . Nondiabetic proliferative retinopathy 10/17/2010  . Insomnia, unspecified 05/19/2010  . ATAXIA 03/17/2010  . DIZZINESS 03/06/2010  . Chronic constipation 02/21/2008  . OSTEOARTHRITIS, KNEE, SEVERE 01/12/2008  . HYPERLIPIDEMIA 01/17/2007  . Hypothyroidism (post surgical) 10/15/2006  . SKIN CANCER, HX OF 06/08/2006  . Diabetes mellitus, insulin dependent (IDDM), controlled ([place]) 04/22/2006  . URTICARIA 04/22/2006  .  POPLITEAL CYST 04/22/2006  . THYROIDECTOMY, SUBTOTAL, HX OF 04/22/2006  . TOTAL KNEE REPLACEMENT, HX OF 04/22/2006    Past Surgical History:  Procedure Laterality Date  . BACK SURGERY     lumbar fracture  . cataract surgery Bilateral   . COLONOSCOPY W/ POLYPECTOMY     x2   . CORONARY STENT INTERVENTION N/A  08/06/2017   Procedure: CORONARY STENT INTERVENTION;  Surgeon: Jettie Booze, MD;  Location: [facility];  Service: Cardiovascular;  Laterality: N/A;  . CYSTOSCOPY WITH INJECTION N/A 09/11/2013   Procedure: CYSTOSCOPY WITH BOTOX INJECTION INTO THE BLADDER;  Surgeon: Ailene Rud, MD;  Location: [facility];  Service: Urology;  Laterality: N/A;  . CYSTOSCOPY WITH RETROGRADE PYELOGRAM, URETEROSCOPY AND STENT PLACEMENT    . CYSTOSCOPY WITH STENT PLACEMENT Right 01/26/2016   Procedure: CYSTOSCOPY, RIGHT RETROGRADE WITH RIGHT URETERAL STENT PLACEMENT;  Surgeon: Cleon Gustin, MD;  Location: [facility];  Service: Urology;  Laterality: Right;  . CYSTOSCOPY/RETROGRADE/URETEROSCOPY Right 04/22/2016   Procedure: CYSTOSCOPY/RETROGRADE/ REMOVAL JJ STENT right insertion stent right insertion of foley;  Surgeon: Franchot Gallo, MD;  Location: [facility];  Service: Urology;  Laterality: Right;  . EXTRACORPOREAL SHOCK WAVE LITHOTRIPSY Right 02/27/2016   Procedure: RIGHT EXTRACORPOREAL SHOCK WAVE LITHOTRIPSY (ESWL) GAITED;  Surgeon: Cleon Gustin, MD;  Location: [facility];  Service: Urology;  Laterality: Right;  . EXTRACORPOREAL SHOCK WAVE LITHOTRIPSY Right 02/10/2016   Procedure: EXTRACORPOREAL SHOCK WAVE LITHOTRIPSY (ESWL);  Surgeon: Kathie Rhodes, MD;  Location: [facility];  Service: Urology;  Laterality: Right;  [facility]-[phone] [id number] [employer]- [id number]   . EYE SURGERY     macular degeneration  . GALLBLADDER SURGERY  2006   no cholecystectomy  . HERNIA REPAIR    . LEFT HEART CATH AND CORONARY ANGIOGRAPHY N/A 08/06/2017   Procedure: LEFT HEART CATH AND CORONARY ANGIOGRAPHY;  Surgeon: Jettie Booze, MD;  Location: [facility];  Service: Cardiovascular;  Laterality: N/A;  . LUNG REMOVAL, PARTIAL  age 60   for bronchiectasis  . PARTIAL THYMECTOMY  1985   for nodules  . POPLITEAL SYNOVIAL CYST EXCISION  2005  . TOTAL KNEE ARTHROPLASTY  2008   right  .  TOTAL KNEE ARTHROPLASTY  2010   left        Home Medications    Prior to Admission medications   Medication Sig Start Date End Date Taking? Authorizing Provider  albuterol (PROAIR HFA) 108 (90 Base) MCG/ACT inhaler Inhale 2 puffs into the lungs 3 (three) times daily as needed for wheezing or shortness of breath.   Yes [provider]  alfuzosin (UROXATRAL) 10 MG 24 hr tablet Take 1 tablet (10 mg total) by mouth at bedtime. 02/10/16  Yes Kathie Rhodes, MD  aspirin EC 81 MG tablet Take 81 mg by mouth daily.    Yes [provider]  atropine 1 % ophthalmic solution Place 1 drop into the right eye daily at 6pm for 14 days. 09/03/16  Yes [provider]  brimonidine-timolol (COMBIGAN) 0.2-0.5 % ophthalmic solution Place 1 drop into both eyes 2 times daily. 09/15/16  Yes [provider]  brinzolamide (AZOPT) 1 % ophthalmic suspension Place 1 drop into the right eye 3 times daily. 09/03/16  Yes [provider]  carbidopa-levodopa (SINEMET IR) 25-100 MG tablet Take 1 tablet by mouth 3 (three) times daily.    Yes [provider]  Cholecalciferol (VITAMIN D-3) 5000 units TABS Take 5,000 Units by mouth daily.   Yes  [provider]  colesevelam (WELCHOL) 625 MG tablet Take 1,875 mg by mouth daily.    Yes [provider]  docusate sodium (COLACE) 100 MG capsule Take 100 mg by mouth daily.   Yes [provider]  fexofenadine (ALLEGRA) 180 MG tablet Take 180 mg by mouth daily.   Yes [provider]  gabapentin (NEURONTIN) 100 MG capsule Take 100 mg by mouth at bedtime.   Yes [provider]  guaiFENesin (MUCINEX) 600 MG 12 hr tablet Take by mouth 2 (two) times daily.   Yes [provider]  hydroxypropyl methylcellulose / hypromellose (ISOPTO TEARS / GONIOVISC) 2.5 % ophthalmic solution Place 1 drop into both eyes 4 (four) times daily.   Yes [provider]  latanoprost (XALATAN) 0.005 % ophthalmic  solution Place 1 drop into the right eye daily. 07/28/16  Yes [provider]  levothyroxine (SYNTHROID, LEVOTHROID) 200 MCG tablet Take 200 mcg by mouth daily.    Yes [provider]  Meth-Hyo-M Bl-Na Phos-Ph Sal (URIBEL) 118 MG CAPS Take 1 capsule (118 mg total) by mouth 3 (three) times daily as needed (burning). Patient taking differently: Take 1 capsule by mouth 3 (three) times daily as needed (for burning).  01/27/16  Yes McKenzie, Candee Furbish, MD  metoprolol succinate (TOPROL-XL) 25 MG 24 hr tablet Take 1 tablet (25 mg total) by mouth daily. 08/08/17  Yes Bonnielee Haff, MD  mirabegron ER (MYRBETRIQ) 50 MG TB24 tablet Take 1 tablet (50 mg total) by mouth daily. 01/28/16  Yes McKenzie, Candee Furbish, MD  Multiple Vitamins-Minerals (ICAPS) TABS Take 1 tablet by mouth daily.   Yes [provider]  omeprazole (PRILOSEC) 40 MG capsule Take 40 mg by mouth daily.   Yes [provider]  polyethylene glycol (MIRALAX / GLYCOLAX) packet Take 17 g by mouth daily.   Yes [provider]  potassium chloride (K-DUR,KLOR-CON) 10 MEQ tablet Take 10 mEq by mouth daily.   Yes [provider]  sitaGLIPtin (JANUVIA) 50 MG tablet Take 1 tablet (50 mg total) by mouth daily. 10/25/15  Yes Tat, Shanon Brow, MD  ticagrelor (BRILINTA) 90 MG TABS tablet Take 1 tablet (90 mg total) by mouth 2 (two) times daily. 08/08/17  Yes Bonnielee Haff, MD  traMADol (ULTRAM) 50 MG tablet Take 25 mg by mouth 2 (two) times daily.   Yes [provider]  zolpidem (AMBIEN) 5 MG tablet Take 0.5 tablets (2.5 mg total) by mouth at bedtime as needed for sleep. 08/08/17  Yes Bonnielee Haff, MD  traMADol (ULTRAM) 50 MG tablet Take 1 tablet (50 mg total) by mouth every 6 (six) hours as needed. Patient not taking: Reported on 08/13/2017 08/08/17   Bonnielee Haff, MD    Family History Family History  Problem Relation Age of Onset  . Other Sister        ophth disease  . Heart attack Father         x7  . Heart disease Father        MI x7  . Hypertension Mother   . Stroke Mother 14  . Amblyopia Neg Hx   . Blindness Neg Hx   . Glaucoma Neg Hx   . Macular degeneration Neg Hx   . Retinal detachment Neg Hx   . Strabismus Neg Hx   . Retinitis pigmentosa Neg Hx   . Cataracts Neg Hx     Social History Social History   Tobacco Use  . Smoking status: Never Smoker  . Smokeless tobacco:  Never Used  Substance Use Topics  . Alcohol use: No  . Drug use: No     Allergies   Colchicine; Hibiclens [chlorhexidine]; Atorvastatin; Ceftin; Celebrex [celecoxib]; Codeine; Contrast media [iodinated diagnostic agents]; Erythromycin; Ezetimibe; Oxycodone-acetaminophen; Rosuvastatin; Simvastatin; Cefpodoxime; Cefuroxime axetil; Nitroglycerin; Tetanus toxoid; and Vantin   Review of Systems Review of Systems  All other systems reviewed and are negative.    Physical Exam Updated Vital Signs BP (!) 144/78   Pulse 69   Temp 98.3 F (36.8 C) (Oral)   Resp (!) 21   Ht 1.803 m (5\' 11" )   Wt 64.9 kg (143 lb)   SpO2 96%   BMI 19.94 kg/m   Physical Exam  Constitutional: He is oriented to person, place, and time. He appears well-developed and well-nourished. No distress.  HENT:  Head: Normocephalic and atraumatic.  Right Ear: External ear normal.  Left Ear: External ear normal.  Nose: Nose normal.  Mouth/Throat: Oropharynx is clear and moist.  Eyes: Pupils are equal, round, and reactive to light.  Neck: Normal range of motion.  Cardiovascular: Normal rate and regular rhythm.  Pulmonary/Chest: Effort normal and breath sounds normal.  Abdominal: Soft. Bowel sounds are normal.  Musculoskeletal: Normal range of motion.  Neurological: He is alert and oriented to person, place, and time.  Skin: Skin is warm and dry. Capillary refill takes less than 2 seconds.  Psychiatric: He has a normal mood and affect.  Nursing note and vitals reviewed.    ED Treatments / Results  Labs (all labs  ordered are listed, but only abnormal results are displayed) Labs Reviewed  COMPREHENSIVE METABOLIC PANEL - Abnormal; Notable for the following components:      Result Value   Glucose, Bld 128 (*)    BUN 25 (*)    Creatinine, Ser 1.82 (*)    Calcium 8.2 (*)    Total Protein 5.5 (*)    Albumin 3.1 (*)    AST 9 (*)    GFR calc non Af Amer 33 (*)    GFR calc Af Amer 38 (*)    All other components within normal limits  CBC WITH DIFFERENTIAL/PLATELET - Abnormal; Notable for the following components:   RBC 3.76 (*)    Hemoglobin 11.0 (*)    HCT 34.4 (*)    Neutro Abs 8.3 (*)    Lymphs Abs 0.5 (*)    All other components within normal limits  PROTIME-INR - Abnormal; Notable for the following components:   Prothrombin Time 15.4 (*)    All other components within normal limits  URINALYSIS, ROUTINE W REFLEX MICROSCOPIC - Abnormal; Notable for the following components:   Color, Urine GREEN (*)    APPearance HAZY (*)    Hgb urine dipstick TRACE (*)    Protein, ur 100 (*)    Leukocytes, UA TRACE (*)    All other components within normal limits  URINALYSIS, MICROSCOPIC (REFLEX) - Abnormal; Notable for the following components:   Bacteria, UA MANY (*)    Non Squamous Epithelial PRESENT (*)    All other components within normal limits  CULTURE, BLOOD (ROUTINE X 2)  CULTURE, BLOOD (ROUTINE X 2)  URINE CULTURE  I-STAT CG4 LACTIC ACID, ED  I-STAT CG4 LACTIC ACID, ED    EKG None  Radiology Ct Hip Left Wo Contrast  Result Date: 08/13/2017 CLINICAL DATA:  Left hip and groin pain. EXAM: CT OF THE LEFT HIP WITHOUT CONTRAST TECHNIQUE: Multidetector CT imaging of the left hip was performed according  to the standard protocol. Multiplanar CT image reconstructions were also generated. COMPARISON:  CT scan 01/25/2016 FINDINGS: Moderate to advanced left hip joint degenerative changes but no obvious fracture or AVN. Pubic symphysis and SI joint degenerative changes with chondrocalcinosis but no  fracture of the left hemipelvis is identified. The surrounding left hip and pelvic musculature are grossly normal. No hematoma or abnormal fluid collection. No inguinal mass or inguinal adenopathy.  No inguinal hernia. Advanced facet disease is noted in the lower lumbar spine but no pars defects. IMPRESSION: 1. Moderate to advanced left hip joint degenerative changes but no fracture or AVN. 2. Grossly normal CT appearance of the left hip and pelvic musculature. 3. Lower lumbar facet disease along with left SI joint and pubic symphysis degenerative changes but no definite pelvic fractures or pelvic bone lesion. Electronically Signed   By: Marijo Sanes M.D.   On: 08/13/2017 11:52   Dg Chest Port 1 View  Result Date: 08/13/2017 CLINICAL DATA:  Fever, weakness, shaking this morning, LEFT leg numbness, history asthmatic bronchitis, type II diabetes mellitus, hypertension, prostate cancer EXAM: PORTABLE CHEST 1 VIEW COMPARISON:  Portable exam 1013 hours compared to 08/03/2017 FINDINGS: Borderline enlargement of cardiac silhouette. Slight pulmonary vascular congestion. Atherosclerotic calcifications aorta. Bibasilar atelectasis. Chronic reticulonodular interstitial changes at LEFT base unchanged. No acute infiltrate, pleural effusion or pneumothorax. Bones demineralized. IMPRESSION: Bibasilar atelectasis and chronic LEFT basilar reticulonodular interstitial disease. No acute abnormalities. Electronically Signed   By: Lavonia Dana M.D.   On: 08/13/2017 10:25   Ct Renal Stone Study  Result Date: 08/13/2017 CLINICAL DATA:  Fever and abdominal pain. EXAM: CT ABDOMEN AND PELVIS WITHOUT CONTRAST TECHNIQUE: Multidetector CT imaging of the abdomen and pelvis was performed following the standard protocol without IV contrast. COMPARISON:  CT scan 01/25/2016 FINDINGS: Lower chest: The lung bases are grossly clear. Minimal dependent bibasilar atelectasis. Numerous small calcifications are noted at both lung bases, left greater  than right possibly due to prior aspiration, remote infection or pulmonary alveolar microlithiasis. The heart is borderline enlarged but stable. Stable aortic and coronary artery calcifications. Large hiatal hernia is stable. Hepatobiliary: No focal hepatic lesions or intrahepatic biliary dilatation. The gallbladder is mildly distended and there are several gallstones noted. No definite CT findings for acute cholecystitis. No significant common bile duct dilatation. Pancreas: Moderate atrophy. Stable small cystic areas. No ductal dilatation. Spleen: Normal size.  No focal lesions. Adrenals/Urinary Tract: The adrenal glands are unremarkable and stable. Small calcifications are noted. Numerous bilateral renal cysts some complicated by rim-like calcifications. No worrisome renal lesions. Bilateral renal calculi. Moderate to marked right-sided hydroureteronephrosis with 2 distal right ureteral calculi noted. The more proximal calculus measures 6.5 x 5.0 mm and the more distal calculus near the UVJ measures 6.0 x 5.0 mm. No left-sided ureteral calculi or hydroureteronephrosis. No bladder mass or bladder calculi. Stomach/Bowel: The stomach, duodenum, small bowel and colon are grossly normal without oral contrast. No obvious acute inflammatory process, mass lesion or obstructive findings. Vascular/Lymphatic: Advanced atherosclerotic calcifications involving the aorta and branch vessels without focal aneurysm. No mesenteric or retroperitoneal mass or adenopathy. Small scattered lymph nodes are stable. Reproductive: The prostate gland and seminal vesicles are unremarkable. Other: Small amount of free pelvic fluid noted on the right side. No pelvic mass or pelvic adenopathy. No inguinal mass or adenopathy. Musculoskeletal: No significant bony findings. Advanced degenerative changes involving the spine. No hip or pelvic fractures are identified. No worrisome bone lesions. Stable mild compression deformity of L1. IMPRESSION: 1.  Moderate to marked right-sided hydroureteronephrosis down to 2 distal right ureteral calculi both measuring approximately 6 x 5 mm. 2. Bilateral renal calculi and multiple bilateral renal cysts. 3. Cholelithiasis without definite CT findings for acute cholecystitis. 4. Advanced atherosclerotic calcifications involving the aorta and branch vessels. 5. Large hiatal hernia. 6. No significant/acute bony findings. Electronically Signed   By: Marijo Sanes M.D.   On: 08/13/2017 12:11    Procedures Procedures (including critical care time)  Medications Ordered in ED Medications  vancomycin (VANCOCIN) IVPB 1000 mg/200 mL premix (1,000 mg Intravenous New Bag/Given 08/13/17 1240)  levofloxacin (LEVAQUIN) IVPB 500 mg (has no administration in time range)  aztreonam (AZACTAM) 1 g in sodium chloride 0.9 % 100 mL IVPB (has no administration in time range)  vancomycin (VANCOCIN) IVPB 750 mg/150 ml premix (has no administration in time range)  levofloxacin (LEVAQUIN) IVPB 750 mg (0 mg Intravenous Stopped 08/13/17 1155)  aztreonam (AZACTAM) 2 g in sodium chloride 0.9 % 100 mL IVPB (0 g Intravenous Stopped 08/13/17 1239)  sodium chloride 0.9 % bolus 1,000 mL (0 mLs Intravenous Stopped 08/13/17 1142)     Initial Impression / Assessment and Plan / ED Course  I have reviewed the triage vital signs and the nursing notes.  Pertinent labs & imaging results that were available during my care of the patient were reviewed by me and considered in my medical decision making (see chart for details).     84 year old man resents today with groin pain and fever.  Evaluation here is significant for urinary tract infection.  CT obtained shows 2 right renal stones with marked right hydroureteronephrosis.  She is treated here with broad-spectrum antibiotics.  Patient care discussed with Dr. Alinda Money, on-call for urology.  He states that patient will likely require urgent intervention.  However patient is on Brilinta and aspirin and this  may limit options.  He requested patient be admitted to hospitalist service.  Hospitalist being paged for admission. Discussed above with Dr. Daryll Drown.  She will see for admission.  Patient remains hemodynamically stable.  I discussed results with patient. Final Clinical Impressions(s) / ED Diagnoses   Final diagnoses:  Urinary tract infection with hematuria, site unspecified  Hydroureteronephrosis  Right ureteral stone  Chronic renal impairment, unspecified CKD stage    ED Discharge Orders    None       Pattricia Boss, MD 08/13/17 1548    Pattricia Boss, MD 08/13/17 [phone]

## 2017-08-13 NOTE — ED Notes (Signed)
Transfer documents/belongings given to [facility].

## 2017-08-13 NOTE — Transfer of Care (Signed)
Immediate Anesthesia Transfer of Care Note  Patient: [name]  Procedure(s) Performed: CYSTOSCOPY WITH RIGHT  RETROGRADE PYELOGRAM/URETERAL STENT PLACEMENT (Right Ureter)  Patient Location: PACU  Anesthesia Type:General  Level of Consciousness: sedated  Airway & Oxygen Therapy: Patient Spontanous Breathing and Patient connected to face mask oxygen  Post-op Assessment: Report given to RN and Post -op Vital signs reviewed and stable  Post vital signs: Reviewed and stable  Last Vitals:  Vitals Value Taken Time  BP    Temp    Pulse 71 08/13/2017 10:10 PM  Resp    SpO2 100 % 08/13/2017 10:10 PM  Vitals shown include unvalidated device data.  Last Pain:  Vitals:   08/13/17 1957  TempSrc: Oral  PainSc:          Complications: No apparent anesthesia complications

## 2017-08-13 NOTE — ED Notes (Signed)
Patient transported to CT 

## 2017-08-13 NOTE — H&P (Signed)
History and Physical    [name] PPI:[id number] DOB: 30-Aug-1931 DOA: 08/13/2017  PCP: System, Pcp Not In  Patient coming from: SNF  Chief Complaint: fever, left hip pain  HPI: [name] is a 84 y.o. male with medical history significant of h/o stroke, prostate Ca, hypothyroidism, h/o nephrolithiasis, HTN, GERD, HLD, DVT, DM2, CKD and parkinson's disease who presents for fevers at the SNF and left hip pain.  He was recently admitted to the hospital for STEMI with known CAD on 7/28 to the nursing home.  At [facility], he was noted to be febrile yesterday and again today and reports pain in the left groin with movement.  This pain comes and goes and is sharp and radiated into the top of the leg.  No caregiver at bedside to give further details.  Patient reports no chest pain, issues with breathing, dysuria, abdominal pain.  He is very hard of hearing and was having acute pain in the groin when I saw him.    ED Course: In the ED, Cr was 1.82, but this is near his baseline of 1.5.  H/H were 11 and 34, INR 1.3.  Lactic acid < 1.  UA with green urine (related to medications) trace leukocytes, many bacteria, negative nitrite.  CT scan of the left hip showed moderate to severe degenerative changes without AVN.  CT renal stone study showed marked hydroureteronephrosis in the right kidney and ureter relate to renal stones.  There were gallstones without cholecystitis and a large hiatal hernia.  EDP discussed with urology, Dr. Alinda Money, who noted that he will need some sort of procedure (stent or tube), but he will need to go to [facility]. He is currently on Brilinta and aspirin as well.   Review of Systems: As per HPI otherwise 10 point review of systems negative.    Past Medical History:  Diagnosis Date  . Allergic rhinitis   . Arthritis    arthritis-kyphosis  . Asthmatic bronchitis   . Chronic kidney disease   . DM type 2 (diabetes mellitus, type 2) ([place])   . DVT, lower extremity,  proximal, acute ([place])   . Dyslipidemia   . Dysrhythmia    '97 tachycardia- no recent issues  . GERD (gastroesophageal reflux disease)   . Hearing impaired    bilateral hearing aids  . History of kidney stones   . History of skin cancer   . Hypertension   . Hypothyroidism   . Macular degeneration    injections done bilaterally recent - 08-22-13  . Prostate cancer [facility])    Prostate cancer-radiation only,skin cancer-basal cell and last squamous cell right eye  . Stroke ([place])   . Urgency-frequency syndrome   . Urticaria     Past Surgical History:  Procedure Laterality Date  . BACK SURGERY     lumbar fracture  . cataract surgery Bilateral   . COLONOSCOPY W/ POLYPECTOMY     x2   . CORONARY STENT INTERVENTION N/A 08/06/2017   Procedure: CORONARY STENT INTERVENTION;  Surgeon: Jettie Booze, MD;  Location: [place] CV LAB;  Service: Cardiovascular;  Laterality: N/A;  . CYSTOSCOPY WITH INJECTION N/A 09/11/2013   Procedure: CYSTOSCOPY WITH BOTOX INJECTION INTO THE BLADDER;  Surgeon: Ailene Rud, MD;  Location: [facility];  Service: Urology;  Laterality: N/A;  . CYSTOSCOPY WITH RETROGRADE PYELOGRAM, URETEROSCOPY AND STENT PLACEMENT    . CYSTOSCOPY WITH STENT PLACEMENT Right 01/26/2016   Procedure: CYSTOSCOPY, RIGHT RETROGRADE WITH RIGHT URETERAL STENT  PLACEMENT;  Surgeon: Cleon Gustin, MD;  Location: [facility];  Service: Urology;  Laterality: Right;  . CYSTOSCOPY/RETROGRADE/URETEROSCOPY Right 04/22/2016   Procedure: CYSTOSCOPY/RETROGRADE/ REMOVAL JJ STENT right insertion stent right insertion of foley;  Surgeon: Franchot Gallo, MD;  Location: [facility];  Service: Urology;  Laterality: Right;  . EXTRACORPOREAL SHOCK WAVE LITHOTRIPSY Right 02/27/2016   Procedure: RIGHT EXTRACORPOREAL SHOCK WAVE LITHOTRIPSY (ESWL) GAITED;  Surgeon: Cleon Gustin, MD;  Location: [facility];  Service: Urology;  Laterality: Right;  . EXTRACORPOREAL SHOCK WAVE LITHOTRIPSY Right 02/10/2016   Procedure:  EXTRACORPOREAL SHOCK WAVE LITHOTRIPSY (ESWL);  Surgeon: Kathie Rhodes, MD;  Location: [facility];  Service: Urology;  Laterality: Right;  [facility]-[phone] [id number] A [employer]- [id number]   . EYE SURGERY     macular degeneration  . GALLBLADDER SURGERY  2006   no cholecystectomy  . HERNIA REPAIR    . LEFT HEART CATH AND CORONARY ANGIOGRAPHY N/A 08/06/2017   Procedure: LEFT HEART CATH AND CORONARY ANGIOGRAPHY;  Surgeon: Jettie Booze, MD;  Location: [place] CV LAB;  Service: Cardiovascular;  Laterality: N/A;  . LUNG REMOVAL, PARTIAL  age 68   for bronchiectasis  . PARTIAL THYMECTOMY  1985   for nodules  . POPLITEAL SYNOVIAL CYST EXCISION  2005  . TOTAL KNEE ARTHROPLASTY  2008   right  . TOTAL KNEE ARTHROPLASTY  2010   left   Reviewed with patient  reports that he has never smoked. He has never used smokeless tobacco. He reports that he does not drink alcohol or use drugs.  Allergies  Allergen Reactions  . Colchicine Anaphylaxis  . Hibiclens [Chlorhexidine] Hives  . Atorvastatin Other (See Comments)    Reaction:  Joint pain   . Ceftin Diarrhea  . Celebrex [Celecoxib] Swelling  . Codeine Hives  . Contrast Media [Iodinated Diagnostic Agents] Nausea And Vomiting and Rash  . Erythromycin Diarrhea  . Ezetimibe Other (See Comments)    Reaction:  Joint pain   . Oxycodone-Acetaminophen Hives  . Rosuvastatin Other (See Comments)    Reaction:  Joint pain   . Simvastatin Other (See Comments)    Reaction:  Joint pain   . Cefpodoxime Rash  . Cefuroxime Axetil Rash  . Nitroglycerin Other (See Comments)    Reaction:  Lowers pts BP  . Tetanus Toxoid Rash  . Vantin Other (See Comments)    Reaction:  Unknown    Attempted to review with patient, but he stated that he could note remember.  Family History  Problem Relation Age of Onset  . Other Sister        ophth disease  . Heart attack Father        x7  . Heart disease Father        MI x7  .  Hypertension Mother   . Stroke Mother 31  . Amblyopia Neg Hx   . Blindness Neg Hx   . Glaucoma Neg Hx   . Macular degeneration Neg Hx   . Retinal detachment Neg Hx   . Strabismus Neg Hx   . Retinitis pigmentosa Neg Hx   . Cataracts Neg Hx     Prior to Admission medications   Medication Sig Start Date End Date Taking? Authorizing Provider  albuterol (PROAIR HFA) 108 (90 Base) MCG/ACT inhaler Inhale 2 puffs into the lungs 3 (three) times daily as needed for wheezing or shortness of breath.   Yes [provider]  alfuzosin (UROXATRAL) 10 MG 24 hr tablet Take 1 tablet (10  mg total) by mouth at bedtime. 02/10/16  Yes Kathie Rhodes, MD  aspirin EC 81 MG tablet Take 81 mg by mouth daily.    Yes [provider]  atropine 1 % ophthalmic solution Place 1 drop into the right eye daily at 6pm for 14 days. 09/03/16  Yes [provider]  brimonidine-timolol (COMBIGAN) 0.2-0.5 % ophthalmic solution Place 1 drop into both eyes 2 times daily. 09/15/16  Yes [provider]  brinzolamide (AZOPT) 1 % ophthalmic suspension Place 1 drop into the right eye 3 times daily. 09/03/16  Yes [provider]  carbidopa-levodopa (SINEMET IR) 25-100 MG tablet Take 1 tablet by mouth 3 (three) times daily.    Yes [provider]  Cholecalciferol (VITAMIN D-3) 5000 units TABS Take 5,000 Units by mouth daily.   Yes [provider]  colesevelam (WELCHOL) 625 MG tablet Take 1,875 mg by mouth daily.    Yes [provider]  docusate sodium (COLACE) 100 MG capsule Take 100 mg by mouth daily.   Yes [provider]  fexofenadine (ALLEGRA) 180 MG tablet Take 180 mg by mouth daily.   Yes [provider]  gabapentin (NEURONTIN) 100 MG capsule Take 100 mg by mouth at bedtime.   Yes [provider]  guaiFENesin (MUCINEX) 600 MG 12 hr tablet Take by mouth 2 (two) times daily.   Yes [provider]  hydroxypropyl methylcellulose /  hypromellose (ISOPTO TEARS / GONIOVISC) 2.5 % ophthalmic solution Place 1 drop into both eyes 4 (four) times daily.   Yes [provider]  latanoprost (XALATAN) 0.005 % ophthalmic solution Place 1 drop into the right eye daily. 07/28/16  Yes [provider]  levothyroxine (SYNTHROID, LEVOTHROID) 200 MCG tablet Take 200 mcg by mouth daily.    Yes [provider]  Meth-Hyo-M Bl-Na Phos-Ph Sal (URIBEL) 118 MG CAPS Take 1 capsule (118 mg total) by mouth 3 (three) times daily as needed (burning). Patient taking differently: Take 1 capsule by mouth 3 (three) times daily as needed (for burning).  01/27/16  Yes McKenzie, Candee Furbish, MD  metoprolol succinate (TOPROL-XL) 25 MG 24 hr tablet Take 1 tablet (25 mg total) by mouth daily. 08/08/17  Yes Bonnielee Haff, MD  mirabegron ER (MYRBETRIQ) 50 MG TB24 tablet Take 1 tablet (50 mg total) by mouth daily. 01/28/16  Yes McKenzie, Candee Furbish, MD  Multiple Vitamins-Minerals (ICAPS) TABS Take 1 tablet by mouth daily.   Yes [provider]  omeprazole (PRILOSEC) 40 MG capsule Take 40 mg by mouth daily.   Yes [provider]  polyethylene glycol (MIRALAX / GLYCOLAX) packet Take 17 g by mouth daily.   Yes [provider]  potassium chloride (K-DUR,KLOR-CON) 10 MEQ tablet Take 10 mEq by mouth daily.   Yes [provider]  sitaGLIPtin (JANUVIA) 50 MG tablet Take 1 tablet (50 mg total) by mouth daily. 10/25/15  Yes Tat, Shanon Brow, MD  ticagrelor (BRILINTA) 90 MG TABS tablet Take 1 tablet (90 mg total) by mouth 2 (two) times daily. 08/08/17  Yes Bonnielee Haff, MD  traMADol (ULTRAM) 50 MG tablet Take 25 mg by mouth 2 (two) times daily.   Yes [provider]  zolpidem (AMBIEN) 5 MG tablet Take 0.5 tablets (2.5 mg total) by mouth at bedtime as needed for sleep. 08/08/17  Yes Bonnielee Haff, MD  traMADol (ULTRAM) 50 MG tablet Take 1 tablet (50 mg total) by mouth every 6 (six) hours as needed. Patient not taking:  Reported on 08/13/2017 08/08/17  Bonnielee Haff, MD    Physical Exam: Constitutional: Elderly man lying in bed, reports left sided groin pain Vitals:   08/13/17 1415 08/13/17 1430 08/13/17 1445 08/13/17 1500  BP: (!) 153/86 134/90 (!) 151/79 (!) 149/77  Pulse: 80 85 74 75  Resp: (!) 23 (!) 27 (!) 23 20  Temp:      TempSrc:      SpO2: 96% 100% 97% 96%  Weight:      Height:       Eyes: PERRL, lids and conjunctivae normal ENMT: Mucous membranes are mildly dry.  Neck: normal, supple Respiratory: CTAB, no wheezing, no rales Cardiovascular: RR, NR, no murmur Abdomen: no tenderness. Bowel sounds positive. No CVA tenderness bilaterally Musculoskeletal: no clubbing / cyanosis.  Normal muscle tone for age.  Skin: no rashes, lesions, ulcers on exposed skin GU: He has pain in the left groin.  There is no obvious hernia at the site, no LAD.  Penis appears normal without discharge.  Blue urine, related to medications Psychiatric: Alert to person and place.  In pain when seen due to left groin pain.   Labs on Admission: I have personally reviewed following labs and imaging studies  CBC: Recent Labs  Lab 08/07/17 0226 08/13/17 0959  WBC 9.9 9.8  NEUTROABS  --  8.3*  HGB 12.3* 11.0*  HCT 37.2* 34.4*  MCV 87.9 91.5  PLT 214 810   Basic Metabolic Panel: Recent Labs  Lab 08/07/17 0226 08/07/17 1132 08/08/17 0308 08/13/17 0959  NA 137 137 139 136  K 3.8 3.7 3.5 4.2  CL 107 106 109 107  CO2 21* 23 23 22   GLUCOSE 178* 169* 113* 128*  BUN 36* 39* 34* 25*  CREATININE 1.52* 1.62* 1.42* 1.82*  CALCIUM 8.7* 8.6* 8.2* 8.2*   GFR: Estimated Creatinine Clearance: 28.2 mL/min (A) (by C-G formula based on SCr of 1.82 mg/dL (H)). Liver Function Tests: Recent Labs  Lab 08/13/17 0959  AST 9*  ALT <5  ALKPHOS 60  BILITOT 1.1  PROT 5.5*  ALBUMIN 3.1*   No results for input(s): LIPASE, AMYLASE in the last 168 hours. No results for input(s): AMMONIA in the last 168 hours. Coagulation  Profile: Recent Labs  Lab 08/13/17 0959  INR 1.23   Cardiac Enzymes: No results for input(s): CKTOTAL, CKMB, CKMBINDEX, TROPONINI in the last 168 hours. BNP (last 3 results) No results for input(s): PROBNP in the last 8760 hours. HbA1C: No results for input(s): HGBA1C in the last 72 hours. CBG: Recent Labs  Lab 08/07/17 1231 08/07/17 1656 08/07/17 2102 08/08/17 0826 08/08/17 1221  GLUCAP 136* 163* 143* 116* 188*   Lipid Profile: No results for input(s): CHOL, HDL, LDLCALC, TRIG, CHOLHDL, LDLDIRECT in the last 72 hours. Thyroid Function Tests: No results for input(s): TSH, T4TOTAL, FREET4, T3FREE, THYROIDAB in the last 72 hours. Anemia Panel: No results for input(s): VITAMINB12, FOLATE, FERRITIN, TIBC, IRON, RETICCTPCT in the last 72 hours. Urine analysis:    Component Value Date/Time   COLORURINE GREEN (A) 08/13/2017 0937   APPEARANCEUR HAZY (A) 08/13/2017 0937   LABSPEC 1.020 08/13/2017 0937   PHURINE 7.0 08/13/2017 0937   GLUCOSEU NEGATIVE 08/13/2017 0937   HGBUR TRACE (A) 08/13/2017 0937   BILIRUBINUR NEGATIVE 08/13/2017 0937   KETONESUR NEGATIVE 08/13/2017 0937   PROTEINUR 100 (A) 08/13/2017 0937   UROBILINOGEN 0.2 05/26/2014 2040   NITRITE NEGATIVE 08/13/2017 0937   LEUKOCYTESUR TRACE (A) 08/13/2017 0937    Radiological Exams on Admission: Ct Hip Left Wo Contrast  Result Date: 08/13/2017 CLINICAL DATA:  Left hip and groin pain. EXAM: CT OF THE LEFT HIP WITHOUT CONTRAST TECHNIQUE: Multidetector CT imaging of the left hip was performed according to the standard protocol. Multiplanar CT image reconstructions were also generated. COMPARISON:  CT scan 01/25/2016 FINDINGS: Moderate to advanced left hip joint degenerative changes but no obvious fracture or AVN. Pubic symphysis and SI joint degenerative changes with chondrocalcinosis but no fracture of the left hemipelvis is identified. The surrounding left hip and pelvic musculature are grossly normal. No hematoma or  abnormal fluid collection. No inguinal mass or inguinal adenopathy.  No inguinal hernia. Advanced facet disease is noted in the lower lumbar spine but no pars defects. IMPRESSION: 1. Moderate to advanced left hip joint degenerative changes but no fracture or AVN. 2. Grossly normal CT appearance of the left hip and pelvic musculature. 3. Lower lumbar facet disease along with left SI joint and pubic symphysis degenerative changes but no definite pelvic fractures or pelvic bone lesion. Electronically Signed   By: Marijo Sanes M.D.   On: 08/13/2017 11:52   Dg Chest Port 1 View  Result Date: 08/13/2017 CLINICAL DATA:  Fever, weakness, shaking this morning, LEFT leg numbness, history asthmatic bronchitis, type II diabetes mellitus, hypertension, prostate cancer EXAM: PORTABLE CHEST 1 VIEW COMPARISON:  Portable exam 1013 hours compared to 08/03/2017 FINDINGS: Borderline enlargement of cardiac silhouette. Slight pulmonary vascular congestion. Atherosclerotic calcifications aorta. Bibasilar atelectasis. Chronic reticulonodular interstitial changes at LEFT base unchanged. No acute infiltrate, pleural effusion or pneumothorax. Bones demineralized. IMPRESSION: Bibasilar atelectasis and chronic LEFT basilar reticulonodular interstitial disease. No acute abnormalities. Electronically Signed   By: Lavonia Dana M.D.   On: 08/13/2017 10:25   Ct Renal Stone Study  Result Date: 08/13/2017 CLINICAL DATA:  Fever and abdominal pain. EXAM: CT ABDOMEN AND PELVIS WITHOUT CONTRAST TECHNIQUE: Multidetector CT imaging of the abdomen and pelvis was performed following the standard protocol without IV contrast. COMPARISON:  CT scan 01/25/2016 FINDINGS: Lower chest: The lung bases are grossly clear. Minimal dependent bibasilar atelectasis. Numerous small calcifications are noted at both lung bases, left greater than right possibly due to prior aspiration, remote infection or pulmonary alveolar microlithiasis. The heart is borderline  enlarged but stable. Stable aortic and coronary artery calcifications. Large hiatal hernia is stable. Hepatobiliary: No focal hepatic lesions or intrahepatic biliary dilatation. The gallbladder is mildly distended and there are several gallstones noted. No definite CT findings for acute cholecystitis. No significant common bile duct dilatation. Pancreas: Moderate atrophy. Stable small cystic areas. No ductal dilatation. Spleen: Normal size.  No focal lesions. Adrenals/Urinary Tract: The adrenal glands are unremarkable and stable. Small calcifications are noted. Numerous bilateral renal cysts some complicated by rim-like calcifications. No worrisome renal lesions. Bilateral renal calculi. Moderate to marked right-sided hydroureteronephrosis with 2 distal right ureteral calculi noted. The more proximal calculus measures 6.5 x 5.0 mm and the more distal calculus near the UVJ measures 6.0 x 5.0 mm. No left-sided ureteral calculi or hydroureteronephrosis. No bladder mass or bladder calculi. Stomach/Bowel: The stomach, duodenum, small bowel and colon are grossly normal without oral contrast. No obvious acute inflammatory process, mass lesion or obstructive findings. Vascular/Lymphatic: Advanced atherosclerotic calcifications involving the aorta and branch vessels without focal aneurysm. No mesenteric or retroperitoneal mass or adenopathy. Small scattered lymph nodes are stable. Reproductive: The prostate gland and seminal vesicles are unremarkable. Other: Small amount of free pelvic fluid noted on the right side. No pelvic mass or pelvic adenopathy. No inguinal mass or adenopathy.  Musculoskeletal: No significant bony findings. Advanced degenerative changes involving the spine. No hip or pelvic fractures are identified. No worrisome bone lesions. Stable mild compression deformity of L1. IMPRESSION: 1. Moderate to marked right-sided hydroureteronephrosis down to 2 distal right ureteral calculi both measuring approximately 6  x 5 mm. 2. Bilateral renal calculi and multiple bilateral renal cysts. 3. Cholelithiasis without definite CT findings for acute cholecystitis. 4. Advanced atherosclerotic calcifications involving the aorta and branch vessels. 5. Large hiatal hernia. 6. No significant/acute bony findings. Electronically Signed   By: Marijo Sanes M.D.   On: 08/13/2017 12:11    EKG: Independently reviewed. Sinus rhythm with a PAC  Assessment/Plan Hydronephrosis due to nephrolithiasis, concern for UTI - Due to nephrolithiasis.  He has no apparent symptoms on that side, no CVA tenderness - Urology has been consulted, Dr. Alinda Money has requested he be sent to [facility] for possible procedure, complicated by being on Brillinta and aspirin use - May need stent or nephrostomy tube - Admit to [facility] to be seen by Urology - Broad spectrum antibiotics started by ED, will be continued overnight until disease is better managed - Aztreonam, FQ and vancomycin - would wean down based on culture results - UA with many bacteria, no squams but few WBC.  He did have a temperature to 102F in hospital - [facility] and UC pending - NPO for possible procedure - Brilinta and aspirin cannot be held given recent DES placed on 7/26.  He will need dual antiplatelet therapy for the time being.   Left hip/groin pain, history of severe OA - I think his left groin pain may be related to severe OA as seen on CT scan - tylenol for pain (he is allergic to morphine/oxycodone) - Monitor for changes  Hypothyroidism (post surgical) - continue synthroid - Check TSH    Diabetes mellitus - SSI while patient acutely ill, on januvia based on medications - Hold januvia - Monitor blood sugar    Essential HTN - BP initially lower and home medications were held - Restart metoprolol    Chronic renal insufficiency - Mildly worsened in the setting of hydronephrosis - IVF with NS at 75cc/hr for 10 hours - Hold nephrotoxic medications    H/O Non-ST elevation (NSTEMI)  myocardial infarction, CAD - Recent DES stent placed, will need to be on dual antiplatelet therapy - Metoprolol - Monitor for changes in symptoms     DVT prophylaxis: SCDs given possible need for procedure Code Status: DNR, reviewed DNR form in media file Disposition Plan: Transition to [facility] for Urology evaluation Consults called: Urology Dr. Alinda Money Admission status: Inpatient, med-surg   Gilles Chiquito MD [facility] Pager [phone]  If 7PM-7AM, please contact night-coverage [email or web address] Password [facility]  08/13/2017, 4:24 PM

## 2017-08-13 NOTE — ED Notes (Signed)
Pt returned from CT °

## 2017-08-13 NOTE — Progress Notes (Signed)
Pharmacy Antibiotic Note  [name] is a 84 y.o. male admitted on 08/13/2017 with sepsis.  Pharmacy has been consulted for vancomycin, levaquin and aztreonam dosing. Tmax is elevated at 102.2 and WBC is WNL. SCr is elevated at 1.82 and lactic acid is normal.   Plan: Vanc 1gm IV x 1 then 750mg  IV Q24H Levaquin 750mg  IV x 1 then 500mg  IV Q48H Aztreonam 2gm IV x 1 then 1gm IV Q8H F/u renal fxn, C&S, clinical status and trough at SS Narrow as able  Height: 5\' 11"  (180.3 cm) Weight: 143 lb (64.9 kg) IBW/kg (Calculated) : 75.3  Temp (24hrs), Avg:102.2 F (39 C), Min:102.2 F (39 C), Max:102.2 F (39 C)  Recent Labs  Lab 08/07/17 0226 08/07/17 1132 08/08/17 0308  WBC 9.9  --   --   CREATININE 1.52* 1.62* 1.42*    Estimated Creatinine Clearance: 36.2 mL/min (A) (by C-G formula based on SCr of 1.42 mg/dL (H)).    Allergies  Allergen Reactions  . Colchicine Anaphylaxis  . Hibiclens [Chlorhexidine] Hives  . Atorvastatin Other (See Comments)    Reaction:  Joint pain   . Ceftin Diarrhea  . Celebrex [Celecoxib] Swelling  . Codeine Hives  . Contrast Media [Iodinated Diagnostic Agents] Nausea And Vomiting and Rash  . Erythromycin Diarrhea  . Ezetimibe Other (See Comments)    Reaction:  Joint pain   . Oxycodone-Acetaminophen Hives  . Rosuvastatin Other (See Comments)    Reaction:  Joint pain   . Simvastatin Other (See Comments)    Reaction:  Joint pain   . Cefpodoxime Rash  . Cefuroxime Axetil Rash  . Nitroglycerin Other (See Comments)    Reaction:  Lowers pts BP  . Tetanus Toxoid Rash  . Vantin Other (See Comments)    Reaction:  Unknown     Antimicrobials this admission: Vanc 8/2>> Aztreo 8/2>> Levaquin 8/2>>  Dose adjustments this admission: N/A  Microbiology results: Pending  Thank you for allowing pharmacy to be a part of this patient's care.  Denorris Reust, Rande Lawman 08/13/2017 10:03 AM

## 2017-08-13 NOTE — Anesthesia Preprocedure Evaluation (Addendum)
Anesthesia Evaluation  Patient identified by MRN, date of birth, ID band Patient awake    Reviewed: Allergy & Precautions, NPO status , Patient's Chart, lab work & pertinent test results, reviewed documented beta blocker date and time   Airway Mallampati: II  TM Distance: >3 FB Neck ROM: Full    Dental no notable dental hx.    Pulmonary asthma ,    Pulmonary exam normal breath sounds clear to auscultation       Cardiovascular hypertension, Pt. on home beta blockers and Pt. on medications + CAD, + Past MI, + Cardiac Stents and + DVT  Normal cardiovascular exam Rhythm:Regular Rate:Normal  ECG: SR, rate 83  Mid RCA to Dist RCA lesion is 25% stenosed. Ost 1st Mrg to 1st Mrg lesion is 70% stenosed. Mid Cx lesion is 80% stenosed. A drug-eluting stent was successfully placed using a STENT SYNERGY DES 2.25X20. Post intervention, there is a 0% residual stenosis. Mid LAD lesion is 25% stenosed. LV end diastolic pressure is normal. There is no aortic valve stenosis.   Recommend uninterrupted dual antiplatelet therapy with Aspirin 81mg  daily and Ticagrelor 90mg  twice daily for a minimum of 12 months (ACS - Class I recommendation).   ECHO: LV EF: 55% -   60%   Neuro/Psych Parkinson's TIACVA, No Residual Symptoms negative psych ROS   GI/Hepatic Neg liver ROS, GERD  Medicated and Controlled,  Endo/Other  diabetesHypothyroidism   Renal/GU Renal disease     Musculoskeletal negative musculoskeletal ROS (+)   Abdominal   Peds  Hematology  (+) anemia ,   Anesthesia Other Findings Right ureteral stone Fever  Reproductive/Obstetrics                            Anesthesia Physical Anesthesia Plan  ASA: IV and emergent  Anesthesia Plan: General   Post-op Pain Management:    Induction: Intravenous  PONV Risk Score and Plan: 2 and Ondansetron, Dexamethasone and Treatment may vary due to age or  medical condition  Airway Management Planned: LMA  Additional Equipment:   Intra-op Plan:   Post-operative Plan: Extubation in OR  Informed Consent: I have reviewed the patients History and Physical, chart, labs and discussed the procedure including the risks, benefits and alternatives for the proposed anesthesia with the patient or authorized representative who has indicated his/her understanding and acceptance.   Dental advisory given  Plan Discussed with: CRNA  Anesthesia Plan Comments:        Anesthesia Quick Evaluation

## 2017-08-13 NOTE — ED Triage Notes (Signed)
Pt from [facility] via EMS; Per staff, Pt had stents put in 1 week ago Wednesday; per staff, pt febrile yesterday, was given tylenol which brought fever down; pt febrile again today @ 101.71F ; pt c/o L groin pain w/ movement  Per staff, pt is DNR; staff states they brought paperwork to hospital last time and never got it back, so EMS has no paperwork today  T 98-100F cbg 125 BP 124/70 HR 86 sinus Mid 80s RA, 4L 99% A&Ox4

## 2017-08-14 ENCOUNTER — Encounter (HOSPITAL_COMMUNITY): Payer: Self-pay | Admitting: Urology

## 2017-08-14 DIAGNOSIS — E119 Type 2 diabetes mellitus without complications: Secondary | ICD-10-CM

## 2017-08-14 DIAGNOSIS — Z794 Long term (current) use of insulin: Secondary | ICD-10-CM

## 2017-08-14 DIAGNOSIS — Q6211 Congenital occlusion of ureteropelvic junction: Secondary | ICD-10-CM

## 2017-08-14 DIAGNOSIS — N189 Chronic kidney disease, unspecified: Secondary | ICD-10-CM

## 2017-08-14 DIAGNOSIS — N201 Calculus of ureter: Secondary | ICD-10-CM

## 2017-08-14 DIAGNOSIS — A419 Sepsis, unspecified organism: Principal | ICD-10-CM

## 2017-08-14 DIAGNOSIS — I4891 Unspecified atrial fibrillation: Secondary | ICD-10-CM

## 2017-08-14 DIAGNOSIS — I214 Non-ST elevation (NSTEMI) myocardial infarction: Secondary | ICD-10-CM

## 2017-08-14 LAB — BASIC METABOLIC PANEL
Anion gap: 10 (ref 5–15)
BUN: 25 mg/dL — ABNORMAL HIGH (ref 8–23)
CHLORIDE: 109 mmol/L (ref 98–111)
CO2: 19 mmol/L — AB (ref 22–32)
Calcium: 8.3 mg/dL — ABNORMAL LOW (ref 8.9–10.3)
Creatinine, Ser: 1.48 mg/dL — ABNORMAL HIGH (ref 0.61–1.24)
GFR calc non Af Amer: 42 mL/min — ABNORMAL LOW (ref >60)
GFR, EST AFRICAN AMERICAN: 49 mL/min — AB (ref >60)
Glucose, Bld: 182 mg/dL — ABNORMAL HIGH (ref 70–99)
POTASSIUM: 4.1 mmol/L (ref 3.5–5.1)
SODIUM: 138 mmol/L (ref 135–145)

## 2017-08-14 LAB — CBC
HEMATOCRIT: 37.5 % — AB (ref 39.0–52.0)
HEMOGLOBIN: 12.1 g/dL — AB (ref 13.0–17.0)
MCH: 29 pg (ref 26.0–34.0)
MCHC: 32.3 g/dL (ref 30.0–36.0)
MCV: 89.9 fL (ref 78.0–100.0)
Platelets: 203 10*3/uL (ref 150–400)
RBC: 4.17 MIL/uL — AB (ref 4.22–5.81)
RDW: 13.8 % (ref 11.5–15.5)
WBC: 6.9 10*3/uL (ref 4.0–10.5)

## 2017-08-14 LAB — TSH: TSH: 0.609 u[IU]/mL (ref 0.350–4.500)

## 2017-08-14 LAB — URINE CULTURE: CULTURE: NO GROWTH

## 2017-08-14 LAB — PROTIME-INR
INR: 1.13
Prothrombin Time: 14.4 seconds (ref 11.4–15.2)

## 2017-08-14 LAB — GLUCOSE, CAPILLARY: Glucose-Capillary: 273 mg/dL — ABNORMAL HIGH (ref 70–99)

## 2017-08-14 MED ORDER — METOPROLOL TARTRATE 5 MG/5ML IV SOLN
INTRAVENOUS | Status: AC
  Administered 2017-08-14: 5 mg
  Filled 2017-08-14: qty 5

## 2017-08-14 MED ORDER — METOPROLOL TARTRATE 5 MG/5ML IV SOLN
5.0000 mg | Freq: Once | INTRAVENOUS | Status: DC
  Filled 2017-08-14: qty 5

## 2017-08-14 MED ORDER — DILTIAZEM LOAD VIA INFUSION
15.0000 mg | Freq: Once | INTRAVENOUS | Status: DC
  Filled 2017-08-14: qty 15

## 2017-08-14 MED ORDER — SODIUM CHLORIDE 0.9 % IV SOLN
INTRAVENOUS | Status: DC | PRN
  Administered 2017-08-14 – 2017-08-15 (×2): 250 mL via INTRAVENOUS

## 2017-08-14 MED ORDER — DILTIAZEM LOAD VIA INFUSION
20.0000 mg | Freq: Once | INTRAVENOUS | Status: AC | PRN
  Administered 2017-08-14: 20 mg via INTRAVENOUS
  Filled 2017-08-14: qty 20

## 2017-08-14 MED ORDER — METOPROLOL TARTRATE 5 MG/5ML IV SOLN
5.0000 mg | Freq: Four times a day (QID) | INTRAVENOUS | Status: DC | PRN
  Administered 2017-08-16: 5 mg via INTRAVENOUS

## 2017-08-14 MED ORDER — SODIUM CHLORIDE 0.9 % IV SOLN
1.5000 g | Freq: Four times a day (QID) | INTRAVENOUS | Status: DC
  Administered 2017-08-14 – 2017-08-16 (×8): 1.5 g via INTRAVENOUS
  Filled 2017-08-14 (×9): qty 1.5

## 2017-08-14 MED ORDER — DILTIAZEM LOAD VIA INFUSION: 10.0000 mg | Freq: Once | INTRAVENOUS | Status: DC | PRN

## 2017-08-14 MED ORDER — DILTIAZEM HCL-DEXTROSE 100-5 MG/100ML-% IV SOLN (PREMIX)
5.0000 mg/h | INTRAVENOUS | Status: DC
  Administered 2017-08-14: 10 mg/h via INTRAVENOUS
  Administered 2017-08-14 – 2017-08-15 (×3): 15 mg/h via INTRAVENOUS
  Filled 2017-08-14 (×4): qty 100

## 2017-08-14 MED ORDER — SODIUM CHLORIDE 0.9 % IV SOLN
INTRAVENOUS | Status: AC
  Administered 2017-08-14 (×2): via INTRAVENOUS

## 2017-08-14 MED ORDER — SODIUM CHLORIDE 0.9 % IV BOLUS
500.0000 mL | Freq: Once | INTRAVENOUS | Status: AC
  Administered 2017-08-14: 500 mL via INTRAVENOUS

## 2017-08-14 NOTE — Progress Notes (Signed)
At 0950, patient called staff reporting 5/10 mid sternal chest pain that came on all of a sudden while he was sitting in the chair. Patient noted to be diaphoretic, BP 71/46, HR not accurately obtained on the dynamap at this time (due to being in afib with RVR). Patient reported some lightheadedness as well. Patient was able to stand and pivot back to bed with staff assist. Patient placed in trendelenburg position and EKG was obtained. EKG confirmed patient in Afib with RVR, HR 160-170. Patient c/o of persistent chest pain during this time. MD at bedside during this time. Orders received for IV Metoprolol push 5mg  x2 doses. Metoprolol did not make any changes in HR. MD gave orders to start a Diltiazem gtt: gave 20mg  bolus and started rate at 10mg /hr. HR decreased to 98-120's after the Diltiazem bolus and being at 10mg /hr for about 10 minutes. MD gave orders to increase rate to 15mg /hr, rate increased at 1034. SBP fluctuated during all of this time from 76-125.   At 29 RN verified with EKG that patient had converted to NSR with 1st AV block HR 76, BP 109/64. Patient reported "feeling much better" and denied any further chest pain. MD made aware that patient had converted to NSR. Will continue to monitor HR and BP.   Prophet Renwick, Fraser Din 08/14/2017 11:32 AM

## 2017-08-14 NOTE — Anesthesia Postprocedure Evaluation (Signed)
Anesthesia Post Note  Patient: Tyeson Tanimoto Newberry  Procedure(s) Performed: CYSTOSCOPY WITH RIGHT  RETROGRADE PYELOGRAM/URETERAL STENT PLACEMENT (Right Ureter)     Patient location during evaluation: PACU Anesthesia Type: General Level of consciousness: awake Pain management: pain level controlled Vital Signs Assessment: post-procedure vital signs reviewed and stable Respiratory status: spontaneous breathing, nonlabored ventilation, respiratory function stable and patient connected to nasal cannula oxygen Cardiovascular status: blood pressure returned to baseline and stable Postop Assessment: no apparent nausea or vomiting Anesthetic complications: no    Last Vitals:  Vitals:   08/13/17 2302 08/13/17 2345  BP: (!) 157/80 (!) 157/88  Pulse: 67 79  Resp: (!) 22 19  Temp: 36.5 C 36.7 C  SpO2: 96% 96%    Last Pain:  Vitals:   08/13/17 2345  TempSrc: Oral  PainSc:                  Ryan P Ellender

## 2017-08-14 NOTE — Progress Notes (Addendum)
[facility] PROGRESS NOTE    Progress Note  [name]  IRJ:[id number] DOB: 1931/08/12 DOA: 08/13/2017 PCP: System, Pcp Not In     Brief Narrative:   [name] is an 84 y.o. male past medical history of stroke, prostate cancer hypothyroidism nephrolithiasis diabetes mellitus type 2 chronic kidney disease, with a recent STEMI on 07/31/2017 who presents from skilled nursing facility for fevers and left hip pain sharp radiating to the top of her leg was found to have a creatinine of 1.8 (baseline 1.5) hemoglobin of 1111 INR 1.3 CT scan of the left hip showed moderate to severe osteoarthritic changes, CT renal protocol showed right hydronephrosis with nephrolithiasis, urology was consulted recommended transfer to [facility] for cystoscopy and stent placement.  Assessment/Plan:   Right hydronephrosis due to nephrolithiasis and possibly UTI: Urology was consulted and Dr. Alinda Money will perform a cystoscopy with right ureteral stent placement on 08/13/2017. Broad spectrum antibiotics were started IV vancomycin Levaquin and aztreonam. We will de-escalate his antibiotic regimen to IV Unasyn, I do not do know he has not nonspecific allergies to cephalosporins. Culture data is pending. Resume his diet.  New onset A. fib with RVR: He was given 2 doses of metoprolol with no improvement was given his oral metoprolol. We will start him on a diltiazem drip. He is on aspirin and Brilinta. We will go ahead and consult cardiology as we will probably have to start him on a NOAC and will have to take him off [place].  Left hip groin with a history of severe osteoarthritis: Continue Tylenol for pain.  Hypothyroidism: Continue Synthroid.  Diabetes mellitus type 2: Hold Januvia continue sliding scale insulin resume his diet.  Essential hypertension: Resume antihypertensive medication with his blood pressure start rising  Acute Injury on chronic kidney disease stage III: Hold  nephrotoxic drugs, getting back to baseline KVO IV fluid he is tolerating his diet.  History of non-ST elevation MI: Status post TPA placement: We will discuss with cardiology to see if we could change him to aspirin and Plavix and 1 of the newer novel agents for anticoagulation for his new onset A. fib. He denies any chest pain or shortness of breath.   DVT prophylaxis: asp and brillinta Family Communication:none Disposition Plan/Barrier to D/C: SNF in 2 days Code Status:     Code Status Orders  (From admission, onward)        Start     Ordered   08/13/17 1740  Do not attempt resuscitation (DNR)  Continuous    Question Answer Comment  In the event of cardiac or respiratory ARREST Do not call a "code blue"   In the event of cardiac or respiratory ARREST Do not perform Intubation, CPR, defibrillation or ACLS   In the event of cardiac or respiratory ARREST Use medication by any route, position, wound care, and other measures to relive pain and suffering. May use oxygen, suction and manual treatment of airway obstruction as needed for comfort.      08/13/17 1740    Code Status History    Date Active Date Inactive Code Status Order ID Comments User Context   08/03/2017 2237 08/08/2017 1705 DNR [id number]  Ivor Costa, MD ED   04/22/2016 2202 04/23/2016 1551 Full Code [id number]  Franchot Gallo, MD Inpatient   01/25/2016 1725 01/27/2016 2031 Full Code [id number]  Cleon Gustin, MD ED   10/24/2015 0128 10/25/2015 1715 Full Code [id number]  Norval Morton, MD Inpatient   10/14/2014  [place] 10/15/2014 2058 Full Code [id number]  Samella Parr, NP Inpatient    Advance Directive Documentation     Most Recent Value  Type of Advance Directive  Healthcare Power of [place], Living will  Pre-existing out of facility DNR order (yellow form or pink MOST form)  -  "MOST" Form in Place?  -        IV Access:    Peripheral IV   Procedures and diagnostic studies:   Ct Hip Left Wo  Contrast  Result Date: 08/13/2017 CLINICAL DATA:  Left hip and groin pain. EXAM: CT OF THE LEFT HIP WITHOUT CONTRAST TECHNIQUE: Multidetector CT imaging of the left hip was performed according to the standard protocol. Multiplanar CT image reconstructions were also generated. COMPARISON:  CT scan 01/25/2016 FINDINGS: Moderate to advanced left hip joint degenerative changes but no obvious fracture or AVN. Pubic symphysis and SI joint degenerative changes with chondrocalcinosis but no fracture of the left hemipelvis is identified. The surrounding left hip and pelvic musculature are grossly normal. No hematoma or abnormal fluid collection. No inguinal mass or inguinal adenopathy.  No inguinal hernia. Advanced facet disease is noted in the lower lumbar spine but no pars defects. IMPRESSION: 1. Moderate to advanced left hip joint degenerative changes but no fracture or AVN. 2. Grossly normal CT appearance of the left hip and pelvic musculature. 3. Lower lumbar facet disease along with left SI joint and pubic symphysis degenerative changes but no definite pelvic fractures or pelvic bone lesion. Electronically Signed   By: Marijo Sanes M.D.   On: 08/13/2017 11:52   Dg Chest Port 1 View  Result Date: 08/13/2017 CLINICAL DATA:  Fever, weakness, shaking this morning, LEFT leg numbness, history asthmatic bronchitis, type II diabetes mellitus, hypertension, prostate cancer EXAM: PORTABLE CHEST 1 VIEW COMPARISON:  Portable exam 1013 hours compared to 08/03/2017 FINDINGS: Borderline enlargement of cardiac silhouette. Slight pulmonary vascular congestion. Atherosclerotic calcifications aorta. Bibasilar atelectasis. Chronic reticulonodular interstitial changes at LEFT base unchanged. No acute infiltrate, pleural effusion or pneumothorax. Bones demineralized. IMPRESSION: Bibasilar atelectasis and chronic LEFT basilar reticulonodular interstitial disease. No acute abnormalities. Electronically Signed   By: Lavonia Dana M.D.   On:  08/13/2017 10:25   Dg C-arm 1-60 Min-no Report  Result Date: 08/13/2017 Fluoroscopy was utilized by the requesting physician.  No radiographic interpretation.   Ct Renal Stone Study  Result Date: 08/13/2017 CLINICAL DATA:  Fever and abdominal pain. EXAM: CT ABDOMEN AND PELVIS WITHOUT CONTRAST TECHNIQUE: Multidetector CT imaging of the abdomen and pelvis was performed following the standard protocol without IV contrast. COMPARISON:  CT scan 01/25/2016 FINDINGS: Lower chest: The lung bases are grossly clear. Minimal dependent bibasilar atelectasis. Numerous small calcifications are noted at both lung bases, left greater than right possibly due to prior aspiration, remote infection or pulmonary alveolar microlithiasis. The heart is borderline enlarged but stable. Stable aortic and coronary artery calcifications. Large hiatal hernia is stable. Hepatobiliary: No focal hepatic lesions or intrahepatic biliary dilatation. The gallbladder is mildly distended and there are several gallstones noted. No definite CT findings for acute cholecystitis. No significant common bile duct dilatation. Pancreas: Moderate atrophy. Stable small cystic areas. No ductal dilatation. Spleen: Normal size.  No focal lesions. Adrenals/Urinary Tract: The adrenal glands are unremarkable and stable. Small calcifications are noted. Numerous bilateral renal cysts some complicated by rim-like calcifications. No worrisome renal lesions. Bilateral renal calculi. Moderate to marked right-sided hydroureteronephrosis with 2 distal right ureteral calculi noted. The more proximal calculus measures 6.5 x  5.0 mm and the more distal calculus near the UVJ measures 6.0 x 5.0 mm. No left-sided ureteral calculi or hydroureteronephrosis. No bladder mass or bladder calculi. Stomach/Bowel: The stomach, duodenum, small bowel and colon are grossly normal without oral contrast. No obvious acute inflammatory process, mass lesion or obstructive findings.  Vascular/Lymphatic: Advanced atherosclerotic calcifications involving the aorta and branch vessels without focal aneurysm. No mesenteric or retroperitoneal mass or adenopathy. Small scattered lymph nodes are stable. Reproductive: The prostate gland and seminal vesicles are unremarkable. Other: Small amount of free pelvic fluid noted on the right side. No pelvic mass or pelvic adenopathy. No inguinal mass or adenopathy. Musculoskeletal: No significant bony findings. Advanced degenerative changes involving the spine. No hip or pelvic fractures are identified. No worrisome bone lesions. Stable mild compression deformity of L1. IMPRESSION: 1. Moderate to marked right-sided hydroureteronephrosis down to 2 distal right ureteral calculi both measuring approximately 6 x 5 mm. 2. Bilateral renal calculi and multiple bilateral renal cysts. 3. Cholelithiasis without definite CT findings for acute cholecystitis. 4. Advanced atherosclerotic calcifications involving the aorta and branch vessels. 5. Large hiatal hernia. 6. No significant/acute bony findings. Electronically Signed   By: Marijo Sanes M.D.   On: 08/13/2017 12:11     Medical Consultants:    None.  Anti-Infectives:   IV Unasyn.  Subjective:    [name]  he relates his chest pain is improved still mildly short of breath.  Objective:    Vitals:   08/13/17 2245 08/13/17 2302 08/13/17 2345 08/14/17 0411  BP: (!) 165/83 (!) 157/80 (!) 157/88 (!) 154/94  Pulse: 66 67 79 76  Resp: 15 (!) 22 19 (!) 22  Temp: 98.3 F (36.8 C) 97.7 F (36.5 C) 98.1 F (36.7 C) 98 F (36.7 C)  TempSrc:  Oral Oral Oral  SpO2: 100% 96% 96% 97%  Weight:      Height:        Intake/Output Summary (Last 24 hours) at 08/14/2017 0757 Last data filed at 08/14/2017 0600 Gross per 24 hour  Intake 2323.11 ml  Output 650 ml  Net 1673.11 ml   Filed Weights   08/13/17 0918  Weight: 64.9 kg (143 lb)    Exam: General exam: In no acute distress. Respiratory  system: Good air movement and clear to auscultation. Cardiovascular system: S1 & S2 heard, RRR. No JVD, murmurs, rubs, gallops or clicks.  Gastrointestinal system: Abdomen is nondistended, soft and nontender.  Central nervous system: Alert and oriented. No focal neurological deficits. Extremities: No pedal edema. Skin: No rashes, lesions or ulcers Psychiatry: Judgement and insight appear normal. Mood & affect appropriate.    Data Reviewed:    Labs: Basic Metabolic Panel: Recent Labs  Lab 08/07/17 1132 08/08/17 0308 08/13/17 0959 08/14/17 0502  NA 137 139 136 138  K 3.7 3.5 4.2 4.1  CL 106 109 107 109  CO2 23 23 22  19*  GLUCOSE 169* 113* 128* 182*  BUN 39* 34* 25* 25*  CREATININE 1.62* 1.42* 1.82* 1.48*  CALCIUM 8.6* 8.2* 8.2* 8.3*   GFR Estimated Creatinine Clearance: 34.7 mL/min (A) (by C-G formula based on SCr of 1.48 mg/dL (H)). Liver Function Tests: Recent Labs  Lab 08/13/17 0959  AST 9*  ALT <5  ALKPHOS 60  BILITOT 1.1  PROT 5.5*  ALBUMIN 3.1*   No results for input(s): LIPASE, AMYLASE in the last 168 hours. No results for input(s): AMMONIA in the last 168 hours. Coagulation profile Recent Labs  Lab 08/13/17 0959 08/14/17  0502  INR 1.23 1.13    CBC: Recent Labs  Lab 08/13/17 0959 08/14/17 0502  WBC 9.8 6.9  NEUTROABS 8.3*  --   HGB 11.0* 12.1*  HCT 34.4* 37.5*  MCV 91.5 89.9  PLT 157 203   Cardiac Enzymes: No results for input(s): CKTOTAL, CKMB, CKMBINDEX, TROPONINI in the last 168 hours. BNP (last 3 results) No results for input(s): PROBNP in the last 8760 hours. CBG: Recent Labs  Lab 08/07/17 1656 08/07/17 2102 08/08/17 0826 08/08/17 1221 08/13/17 2213  GLUCAP 163* 143* 116* 188* 83   D-Dimer: No results for input(s): DDIMER in the last 72 hours. Hgb A1c: No results for input(s): HGBA1C in the last 72 hours. Lipid Profile: No results for input(s): CHOL, HDL, LDLCALC, TRIG, CHOLHDL, LDLDIRECT in the last 72 hours. Thyroid  function studies: Recent Labs    08/14/17 0502  TSH 0.609   Anemia work up: No results for input(s): VITAMINB12, FOLATE, FERRITIN, TIBC, IRON, RETICCTPCT in the last 72 hours. Sepsis Labs: Recent Labs  Lab 08/13/17 0959 08/13/17 1015 08/13/17 1157 08/14/17 0502  WBC 9.8  --   --  6.9  LATICACIDVEN  --  0.76 0.66  --    Microbiology No results found for this or any previous visit (from the past 240 hour(s)).   Medications:   . alfuzosin  10 mg Oral QHS  . aspirin EC  81 mg Oral Daily  . atropine  1 drop Right Eye Daily  . brimonidine  1 drop Both Eyes BID   And  . timolol  1 drop Both Eyes BID  . brinzolamide  1 drop Right Eye TID  . carbidopa-levodopa  1 tablet Oral TID  . cholecalciferol  5,000 Units Oral Daily  . colesevelam  1,875 mg Oral Daily  . docusate sodium  100 mg Oral Daily  . gabapentin  100 mg Oral QHS  . latanoprost  1 drop Right Eye QHS  . levothyroxine  200 mcg Oral QAC breakfast  . metoprolol succinate  25 mg Oral Daily  . mirabegron ER  50 mg Oral Daily  . pantoprazole  80 mg Oral Daily  . potassium chloride  10 mEq Oral Daily  . ticagrelor  90 mg Oral BID   Continuous Infusions: . aztreonam Stopped (08/14/17 0440)  . [START ON 08/15/2017] levofloxacin (LEVAQUIN) IV    . vancomycin       LOS: 1 day   [facility] Pager [phone]  *Please refer to [email or web address], password [email or web address] to get updated schedule on who will round on this patient, as hospitalists switch teams weekly. If 7PM-7AM, please contact night-coverage at [email or web address], password [email or web address] for any overnight needs.  08/14/2017, 7:57 AM

## 2017-08-14 NOTE — Progress Notes (Signed)
1 Day Post-Op Subjective: S/p right ureteral stent placement yesterday for sepsis from a urinary source and right ureteral calculus.  Objective: Vital signs in last 24 hours: Temp:  [97.7 F (36.5 C)-99.4 F (37.4 C)] 98 F (36.7 C) (08/03 0411) Pulse Rate:  [66-91] 76 (08/03 0411) Resp:  [14-29] 22 (08/03 0411) BP: (117-173)/(67-102) 154/94 (08/03 0411) SpO2:  [94 %-100 %] 97 % (08/03 0411)  Intake/Output from previous day: 08/02 0701 - 08/03 0700 In: 2323.1 [I.V.:825.9; IV Piggyback:1497.3] Out: 650 [Urine:650] Intake/Output this shift: No intake/output data recorded.  Physical Exam:  General:alert, cooperative and appears stated age GI: soft, non tender, normal bowel sounds, no palpable masses, no organomegaly, no inguinal hernia Male genitalia: not done Extremities: extremities normal, atraumatic, no cyanosis or edema  Lab Results: Recent Labs    08/13/17 0959 08/14/17 0502  HGB 11.0* 12.1*  HCT 34.4* 37.5*   BMET Recent Labs    08/13/17 0959 08/14/17 0502  NA 136 138  K 4.2 4.1  CL 107 109  CO2 22 19*  GLUCOSE 128* 182*  BUN 25* 25*  CREATININE 1.82* 1.48*  CALCIUM 8.2* 8.3*   Recent Labs    08/13/17 0959 08/14/17 0502  INR 1.23 1.13   No results for input(s): LABURIN in the last 72 hours. Results for orders placed or performed during the hospital encounter of 08/13/17  Urine Culture     Status: None   Collection Time: 08/13/17  9:37 AM  Result Value Ref Range Status   Specimen Description URINE, CATHETERIZED  Final   Special Requests NONE  Final   Culture   Final    NO GROWTH Performed at [facility], [address], [place], [place] [address]    Report Status 08/14/2017 FINAL  Final    Studies/Results: Ct Hip Left Wo Contrast  Result Date: 08/13/2017 CLINICAL DATA:  Left hip and groin pain. EXAM: CT OF THE LEFT HIP WITHOUT CONTRAST TECHNIQUE: Multidetector CT imaging of the left hip was performed according to the standard protocol.  Multiplanar CT image reconstructions were also generated. COMPARISON:  CT scan 01/25/2016 FINDINGS: Moderate to advanced left hip joint degenerative changes but no obvious fracture or AVN. Pubic symphysis and SI joint degenerative changes with chondrocalcinosis but no fracture of the left hemipelvis is identified. The surrounding left hip and pelvic musculature are grossly normal. No hematoma or abnormal fluid collection. No inguinal mass or inguinal adenopathy.  No inguinal hernia. Advanced facet disease is noted in the lower lumbar spine but no pars defects. IMPRESSION: 1. Moderate to advanced left hip joint degenerative changes but no fracture or AVN. 2. Grossly normal CT appearance of the left hip and pelvic musculature. 3. Lower lumbar facet disease along with left SI joint and pubic symphysis degenerative changes but no definite pelvic fractures or pelvic bone lesion. Electronically Signed   By: Marijo Sanes M.D.   On: 08/13/2017 11:52   Dg Chest Port 1 View  Result Date: 08/13/2017 CLINICAL DATA:  Fever, weakness, shaking this morning, LEFT leg numbness, history asthmatic bronchitis, type II diabetes mellitus, hypertension, prostate cancer EXAM: PORTABLE CHEST 1 VIEW COMPARISON:  Portable exam 1013 hours compared to 08/03/2017 FINDINGS: Borderline enlargement of cardiac silhouette. Slight pulmonary vascular congestion. Atherosclerotic calcifications aorta. Bibasilar atelectasis. Chronic reticulonodular interstitial changes at LEFT base unchanged. No acute infiltrate, pleural effusion or pneumothorax. Bones demineralized. IMPRESSION: Bibasilar atelectasis and chronic LEFT basilar reticulonodular interstitial disease. No acute abnormalities. Electronically Signed   By: Lavonia Dana  M.D.   On: 08/13/2017 10:25   Dg C-arm 1-60 Min-no Report  Result Date: 08/13/2017 Fluoroscopy was utilized by the requesting physician.  No radiographic interpretation.   Ct Renal Stone Study  Result Date:  08/13/2017 CLINICAL DATA:  Fever and abdominal pain. EXAM: CT ABDOMEN AND PELVIS WITHOUT CONTRAST TECHNIQUE: Multidetector CT imaging of the abdomen and pelvis was performed following the standard protocol without IV contrast. COMPARISON:  CT scan 01/25/2016 FINDINGS: Lower chest: The lung bases are grossly clear. Minimal dependent bibasilar atelectasis. Numerous small calcifications are noted at both lung bases, left greater than right possibly due to prior aspiration, remote infection or pulmonary alveolar microlithiasis. The heart is borderline enlarged but stable. Stable aortic and coronary artery calcifications. Large hiatal hernia is stable. Hepatobiliary: No focal hepatic lesions or intrahepatic biliary dilatation. The gallbladder is mildly distended and there are several gallstones noted. No definite CT findings for acute cholecystitis. No significant common bile duct dilatation. Pancreas: Moderate atrophy. Stable small cystic areas. No ductal dilatation. Spleen: Normal size.  No focal lesions. Adrenals/Urinary Tract: The adrenal glands are unremarkable and stable. Small calcifications are noted. Numerous bilateral renal cysts some complicated by rim-like calcifications. No worrisome renal lesions. Bilateral renal calculi. Moderate to marked right-sided hydroureteronephrosis with 2 distal right ureteral calculi noted. The more proximal calculus measures 6.5 x 5.0 mm and the more distal calculus near the UVJ measures 6.0 x 5.0 mm. No left-sided ureteral calculi or hydroureteronephrosis. No bladder mass or bladder calculi. Stomach/Bowel: The stomach, duodenum, small bowel and colon are grossly normal without oral contrast. No obvious acute inflammatory process, mass lesion or obstructive findings. Vascular/Lymphatic: Advanced atherosclerotic calcifications involving the aorta and branch vessels without focal aneurysm. No mesenteric or retroperitoneal mass or adenopathy. Small scattered lymph nodes are stable.  Reproductive: The prostate gland and seminal vesicles are unremarkable. Other: Small amount of free pelvic fluid noted on the right side. No pelvic mass or pelvic adenopathy. No inguinal mass or adenopathy. Musculoskeletal: No significant bony findings. Advanced degenerative changes involving the spine. No hip or pelvic fractures are identified. No worrisome bone lesions. Stable mild compression deformity of L1. IMPRESSION: 1. Moderate to marked right-sided hydroureteronephrosis down to 2 distal right ureteral calculi both measuring approximately 6 x 5 mm. 2. Bilateral renal calculi and multiple bilateral renal cysts. 3. Cholelithiasis without definite CT findings for acute cholecystitis. 4. Advanced atherosclerotic calcifications involving the aorta and branch vessels. 5. Large hiatal hernia. 6. No significant/acute bony findings. Electronically Signed   By: Marijo Sanes M.D.   On: 08/13/2017 12:11    Assessment/Plan: 84yo with sepsis from a urinary source, rigth ureteral calculus s/p R ureteral stent placement  1. Sepsis management per primary team 2. The patient will be scheduled for right ureteroscopic stone extraction after he has completed a 14 day course of culture specific antibiotics   LOS: 1 day   Nicolette Bang 08/14/2017, 10:35 AM

## 2017-08-15 DIAGNOSIS — I4891 Unspecified atrial fibrillation: Secondary | ICD-10-CM

## 2017-08-15 DIAGNOSIS — I2510 Atherosclerotic heart disease of native coronary artery without angina pectoris: Secondary | ICD-10-CM

## 2017-08-15 LAB — CBC WITH DIFFERENTIAL/PLATELET
Basophils Absolute: 0 10*3/uL (ref 0.0–0.1)
Basophils Relative: 0 %
EOS ABS: 0.1 10*3/uL (ref 0.0–0.7)
EOS PCT: 1 %
HCT: 31.3 % — ABNORMAL LOW (ref 39.0–52.0)
Hemoglobin: 10.4 g/dL — ABNORMAL LOW (ref 13.0–17.0)
LYMPHS ABS: 1.1 10*3/uL (ref 0.7–4.0)
Lymphocytes Relative: 12 %
MCH: 29.7 pg (ref 26.0–34.0)
MCHC: 33.2 g/dL (ref 30.0–36.0)
MCV: 89.4 fL (ref 78.0–100.0)
MONO ABS: 1.1 10*3/uL — AB (ref 0.1–1.0)
Monocytes Relative: 12 %
Neutro Abs: 6.6 10*3/uL (ref 1.7–7.7)
Neutrophils Relative %: 75 %
PLATELETS: 199 10*3/uL (ref 150–400)
RBC: 3.5 MIL/uL — ABNORMAL LOW (ref 4.22–5.81)
RDW: 14.1 % (ref 11.5–15.5)
WBC: 8.8 10*3/uL (ref 4.0–10.5)

## 2017-08-15 LAB — OCCULT BLOOD X 1 CARD TO LAB, STOOL: FECAL OCCULT BLD: NEGATIVE

## 2017-08-15 MED ORDER — DILTIAZEM HCL 60 MG PO TABS
60.0000 mg | ORAL_TABLET | Freq: Four times a day (QID) | ORAL | Status: DC
  Administered 2017-08-15 – 2017-08-16 (×2): 60 mg via ORAL
  Filled 2017-08-15 (×2): qty 1

## 2017-08-15 MED ORDER — DILTIAZEM HCL 90 MG PO TABS
90.0000 mg | ORAL_TABLET | Freq: Four times a day (QID) | ORAL | Status: DC
  Administered 2017-08-15: 90 mg via ORAL
  Filled 2017-08-15: qty 1

## 2017-08-15 MED ORDER — POLYETHYLENE GLYCOL 3350 17 G PO PACK
17.0000 g | PACK | Freq: Two times a day (BID) | ORAL | Status: AC
  Administered 2017-08-15 – 2017-08-16 (×2): 17 g via ORAL
  Filled 2017-08-15 (×3): qty 1

## 2017-08-15 NOTE — Clinical Social Work Note (Signed)
Clinical Social Work Assessment  Patient Details  Name: [name] MRN: [id number] Date of Birth: 05-12-1931  Date of referral:  08/15/17               Reason for consult:  Facility Placement                Permission sought to share information with:  Facility Art therapist granted to share information::  Yes, Verbal Permission Granted  Name::        Agency::  [facility]  Relationship::     Contact Information:     Housing/Transportation Living arrangements for the past 2 months:  Assisted Living Facility([facility]) Source of Information:  Patient Patient Interpreter Needed:  None Criminal Activity/Legal Involvement Pertinent to Current Situation/Hospitalization:  No - Comment as needed Significant Relationships:  Siblings(Patient's wife died in 02-19-22. Sister is closest support for patient.) Lives with:  Self, Facility Resident Do you feel safe going back to the place where you live?  Yes Need for family participation in patient care:  No (Coment)  Care giving concerns:  Patient recovering from right ureteral stent for sepsis on 08/13/17. Patient is a current resident of [facility] in [place].   Social Worker assessment / plan:   CSW met with patient to complete assessment and discuss discharge planning.   Patient reports living in [facility] for "not quite a year." Patient's wife of 22 years passed away in 02/19/17 and his closest relative and support is his sister.   Patient reports feeling safe at [facility] and is agreeable to discharging back when medically cleared. Patient reports using a cane to navigate his living space and uses a scooter "for longer distances" around the ALF. No care-giving or safety concerns noted.   Employment status:  Retired Forensic scientist:  Medicare PT Recommendations:  Not assessed at this time Information / Referral to community resources:      Patient/Family's Response to care:  Patient was oriented, pleasant, and engaged. Patient appeared sad at times and shared that he moved to [facility] with his wife before she died in 19-Feb-2022.   Patient/Family's Understanding of and Emotional Response to Diagnosis, Current Treatment, and Prognosis:   Patient satisfied with current care and agreeable to discharge plan. Patient reports understanding and had no further questions for CSW. Patient encouraged to follow up with CSW should additional questions or needs arise.  Emotional Assessment Appearance:  Appears stated age Attitude/Demeanor/Rapport:    Affect (typically observed):  Accepting, Calm, Sad, Appropriate Orientation:  Oriented to Self, Oriented to Place, Oriented to  Time, Oriented to Situation Alcohol / Substance use:    Psych involvement (Current and /or in the community):  No (Comment)  Discharge Needs  Concerns to be addressed:    Readmission within the last 30 days:  Yes Current discharge risk:    Barriers to Discharge:  Continued Medical Work up   [place], [place] 08/15/2017, 3:15 PM

## 2017-08-15 NOTE — Consult Note (Signed)
CARDIOLOGY CONSULT NOTE       Patient ID: [name] MRN: [id number] DOB/AGE: Aug 27, 1931 84 y.o.  Admit date: 08/13/2017 Referring Physician: Olevia Bowens Primary Physician: System, Pcp Not In Primary Cardiologist: Irish Lack Reason for Consultation: PAF  Active Problems:   Hypothyroidism (post surgical)   Diabetes mellitus, insulin dependent (IDDM), controlled ([place])   Mild HTN   Chronic renal insufficiency   Non-ST elevation (NSTEMI) myocardial infarction ([place])   Hydronephrosis   Sepsis ([place])   Atrial fibrillation with RVR ([name])   HPI:  84 y.o. recently seen at [facility] for [name].  Had new onset angina and cath 08/06/17 showed significant circumflex Disease and patient had DES. D/c on DAT. EF normal 55-60%  Moderate LAE. Admitted with fever, hip pain and noted to have marked right hydronephrosis. Brief episode of PAF. No chest pain, dyspnea or palpitations. Subsequently had stent of ureter by Dr Alinda Money and spontaneously converted to NSR. This am taking PO with no complaints feels better. Compliant with meds Came from SNF    ROS All other systems reviewed and negative except as noted above  Past Medical History:  Diagnosis Date  . Allergic rhinitis   . Arthritis    arthritis-kyphosis  . Asthmatic bronchitis   . Chronic kidney disease   . DM type 2 (diabetes mellitus, type 2) ([place])   . DVT, lower extremity, proximal, acute ([place])   . Dyslipidemia   . Dysrhythmia    '97 tachycardia- no recent issues  . GERD (gastroesophageal reflux disease)   . Hearing impaired    bilateral hearing aids  . History of kidney stones   . History of skin cancer   . Hypertension   . Hypothyroidism   . Macular degeneration    injections done bilaterally recent - 08-22-13  . Prostate cancer [facility])    Prostate cancer-radiation only,skin cancer-basal cell and last squamous cell right eye  . Stroke ([place])   . Urgency-frequency syndrome   . Urticaria     Family History  Problem Relation Age of Onset    . Other Sister        ophth disease  . Heart attack Father        x7  . Heart disease Father        MI x7  . Hypertension Mother   . Stroke Mother 12  . Amblyopia Neg Hx   . Blindness Neg Hx   . Glaucoma Neg Hx   . Macular degeneration Neg Hx   . Retinal detachment Neg Hx   . Strabismus Neg Hx   . Retinitis pigmentosa Neg Hx   . Cataracts Neg Hx     Social History   Socioeconomic History  . Marital status: Married    Spouse name: Not on file  . Number of children: 2  . Years of education: Not on file  . Highest education level: Not on file  Occupational History  . Occupation: retired  Scientific laboratory technician  . Financial resource strain: Not on file  . Food insecurity:    Worry: Not on file    Inability: Not on file  . Transportation needs:    Medical: Not on file    Non-medical: Not on file  Tobacco Use  . Smoking status: Never Smoker  . Smokeless tobacco: Never Used  Substance and Sexual Activity  . Alcohol use: No  . Drug use: No  . Sexual activity: Not Currently  Lifestyle  . Physical activity:    Days per week: Not  on file    Minutes per session: Not on file  . Stress: Not on file  Relationships  . Social connections:    Talks on phone: Not on file    Gets together: Not on file    Attends religious service: Not on file    Active member of club or organization: Not on file    Attends meetings of clubs or organizations: Not on file    Relationship status: Not on file  . Intimate partner violence:    Fear of current or ex partner: Not on file    Emotionally abused: Not on file    Physically abused: Not on file    Forced sexual activity: Not on file  Other Topics Concern  . Not on file  Social History Narrative   MUST HAVE MEDS LISTED DO NOT SUBSTITUTE GENERICS PATIENT IS ALLERGIC TO DYES    Past Surgical History:  Procedure Laterality Date  . BACK SURGERY     lumbar fracture  . cataract surgery Bilateral   . COLONOSCOPY W/ POLYPECTOMY     x2   .  CORONARY STENT INTERVENTION N/A 08/06/2017   Procedure: CORONARY STENT INTERVENTION;  Surgeon: Jettie Booze, MD;  Location: [facility];  Service: Cardiovascular;  Laterality: N/A;  . CYSTOSCOPY W/ URETERAL STENT PLACEMENT Right 08/13/2017   Procedure: CYSTOSCOPY WITH RIGHT  RETROGRADE PYELOGRAM/URETERAL STENT PLACEMENT;  Surgeon: Raynelle Bring, MD;  Location: [facility];  Service: Urology;  Laterality: Right;  . CYSTOSCOPY WITH INJECTION N/A 09/11/2013   Procedure: CYSTOSCOPY WITH BOTOX INJECTION INTO THE BLADDER;  Surgeon: Ailene Rud, MD;  Location: [facility];  Service: Urology;  Laterality: N/A;  . CYSTOSCOPY WITH RETROGRADE PYELOGRAM, URETEROSCOPY AND STENT PLACEMENT    . CYSTOSCOPY WITH STENT PLACEMENT Right 01/26/2016   Procedure: CYSTOSCOPY, RIGHT RETROGRADE WITH RIGHT URETERAL STENT PLACEMENT;  Surgeon: Cleon Gustin, MD;  Location: [facility];  Service: Urology;  Laterality: Right;  . CYSTOSCOPY/RETROGRADE/URETEROSCOPY Right 04/22/2016   Procedure: CYSTOSCOPY/RETROGRADE/ REMOVAL JJ STENT right insertion stent right insertion of foley;  Surgeon: Franchot Gallo, MD;  Location: [facility];  Service: Urology;  Laterality: Right;  . EXTRACORPOREAL SHOCK WAVE LITHOTRIPSY Right 02/27/2016   Procedure: RIGHT EXTRACORPOREAL SHOCK WAVE LITHOTRIPSY (ESWL) GAITED;  Surgeon: Cleon Gustin, MD;  Location: [facility];  Service: Urology;  Laterality: Right;  . EXTRACORPOREAL SHOCK WAVE LITHOTRIPSY Right 02/10/2016   Procedure: EXTRACORPOREAL SHOCK WAVE LITHOTRIPSY (ESWL);  Surgeon: Kathie Rhodes, MD;  Location: [facility];  Service: Urology;  Laterality: Right;  [id number]-[phone] [id number] [employer]BS- [id number]   . EYE SURGERY     macular degeneration  . GALLBLADDER SURGERY  2006   no cholecystectomy  . HERNIA REPAIR    . LEFT HEART CATH AND CORONARY ANGIOGRAPHY N/A 08/06/2017   Procedure: LEFT HEART CATH AND CORONARY ANGIOGRAPHY;  Surgeon: Jettie Booze, MD;   Location: [place] [facility];  Service: Cardiovascular;  Laterality: N/A;  . LUNG REMOVAL, PARTIAL  age 75   for bronchiectasis  . PARTIAL THYMECTOMY  1985   for nodules  . POPLITEAL SYNOVIAL CYST EXCISION  2005  . TOTAL KNEE ARTHROPLASTY  2008   right  . TOTAL KNEE ARTHROPLASTY  2010   left     . alfuzosin  10 mg Oral QHS  . aspirin EC  81 mg Oral Daily  . atropine  1 drop Right Eye Daily  . brimonidine  1 drop Both Eyes BID   And  . timolol  1 drop Both Eyes BID  . brinzolamide  1 drop Right Eye TID  . carbidopa-levodopa  1 tablet Oral TID  . cholecalciferol  5,000 Units Oral Daily  . colesevelam  1,875 mg Oral Daily  . diltiazem  90 mg Oral Q6H  . docusate sodium  100 mg Oral Daily  . gabapentin  100 mg Oral QHS  . latanoprost  1 drop Right Eye QHS  . levothyroxine  200 mcg Oral QAC breakfast  . metoprolol succinate  25 mg Oral Daily  . metoprolol tartrate  5 mg Intravenous Once  . mirabegron ER  50 mg Oral Daily  . pantoprazole  80 mg Oral Daily  . polyethylene glycol  17 g Oral BID  . potassium chloride  10 mEq Oral Daily  . ticagrelor  90 mg Oral BID   . sodium chloride 50 mL/hr at 08/15/17 0600  . sodium chloride 10 mL/hr at 08/15/17 0600  . ampicillin-sulbactam (UNASYN) IV 1.5 g (08/15/17 9371)    Physical Exam: Blood pressure (!) 103/58, pulse 61, temperature 97.8 F (36.6 C), temperature source Oral, resp. rate 20, height 5\' 11"  (1.803 m), weight 143 lb (64.9 kg), SpO2 97 %.    Affect appropriate Healthy:  appears stated age 57: normal Neck supple with no adenopathy JVP normal no bruits no thyromegaly Lungs clear with no wheezing and good diaphragmatic motion Heart:  S1/S2 SEM  murmur, no rub, gallop or click PMI normal Abdomen: benighn, BS positve, no tenderness, no AAA no bruit.  No HSM or HJR  Catheter in bladder  Distal pulses intact with no bruits No edema Neuro non-focal Skin warm and dry No muscular weakness   Labs:   Lab Results    Component Value Date   WBC 8.8 08/15/2017   HGB 10.4 (L) 08/15/2017   HCT 31.3 (L) 08/15/2017   MCV 89.4 08/15/2017   PLT 199 08/15/2017    Recent Labs  Lab 08/13/17 0959 08/14/17 0502  NA 136 138  K 4.2 4.1  CL 107 109  CO2 22 19*  BUN 25* 25*  CREATININE 1.82* 1.48*  CALCIUM 8.2* 8.3*  PROT 5.5*  --   BILITOT 1.1  --   ALKPHOS 60  --   ALT <5  --   AST 9*  --   GLUCOSE 128* 182*   Lab Results  Component Value Date   CKTOTAL 41 03/13/2010   CKMB 1.6 03/13/2010   TROPONINI 0.11 (HH) 08/04/2017    Lab Results  Component Value Date   CHOL 157 08/04/2017   CHOL 181 10/15/2014   CHOL 218 (H) 04/02/2011   Lab Results  Component Value Date   HDL 40 (L) 08/04/2017   HDL 48 10/15/2014   HDL 41.70 04/02/2011   Lab Results  Component Value Date   LDLCALC 106 (H) 08/04/2017   LDLCALC 111 (H) 10/15/2014   LDLCALC (H) 03/13/2010    150        Total Cholesterol/HDL:CHD Risk Coronary Heart Disease Risk Table                     Men   Women  1/2 Average Risk   3.4   3.3  Average Risk       5.0   4.4  2 X Average Risk   9.6   7.1  3 X Average Risk  23.4   11.0        Use the calculated Patient Ratio above and the  CHD Risk Table to determine the patient's CHD Risk.        ATP III CLASSIFICATION (LDL):  <100     mg/dL   Optimal  100-129  mg/dL   Near or Above                    Optimal  130-159  mg/dL   Borderline  160-189  mg/dL   High  >190     mg/dL   Very High   Lab Results  Component Value Date   TRIG 57 08/04/2017   TRIG 110 10/15/2014   TRIG 122.0 04/02/2011   Lab Results  Component Value Date   CHOLHDL 3.9 08/04/2017   CHOLHDL 3.8 10/15/2014   CHOLHDL 5 04/02/2011   Lab Results  Component Value Date   LDLDIRECT 153.9 04/02/2011   LDLDIRECT 154.7 11/25/2009   LDLDIRECT 147.5 07/17/2009      Radiology: Dg Chest 2 View  Result Date: 08/03/2017 CLINICAL DATA:  Chest pain EXAM: CHEST - 2 VIEW COMPARISON:  01/25/2016 FINDINGS: Heart size and  vascularity normal. Probable COPD. Negative for infiltrate effusion or mass. Numerous small calcifications in the left lung base are chronic and unchanged. IMPRESSION: No active cardiopulmonary disease. Electronically Signed   By: Franchot Gallo M.D.   On: 08/03/2017 18:33   Ct Hip Left Wo Contrast  Result Date: 08/13/2017 CLINICAL DATA:  Left hip and groin pain. EXAM: CT OF THE LEFT HIP WITHOUT CONTRAST TECHNIQUE: Multidetector CT imaging of the left hip was performed according to the standard protocol. Multiplanar CT image reconstructions were also generated. COMPARISON:  CT scan 01/25/2016 FINDINGS: Moderate to advanced left hip joint degenerative changes but no obvious fracture or AVN. Pubic symphysis and SI joint degenerative changes with chondrocalcinosis but no fracture of the left hemipelvis is identified. The surrounding left hip and pelvic musculature are grossly normal. No hematoma or abnormal fluid collection. No inguinal mass or inguinal adenopathy.  No inguinal hernia. Advanced facet disease is noted in the lower lumbar spine but no pars defects. IMPRESSION: 1. Moderate to advanced left hip joint degenerative changes but no fracture or AVN. 2. Grossly normal CT appearance of the left hip and pelvic musculature. 3. Lower lumbar facet disease along with left SI joint and pubic symphysis degenerative changes but no definite pelvic fractures or pelvic bone lesion. Electronically Signed   By: Marijo Sanes M.D.   On: 08/13/2017 11:52   Nm Myocar Multi W/spect W/wall Motion / Ef  Result Date: 08/05/2017  There was no ST segment deviation noted during stress.  Findings consistent with ischemia.  The left ventricular ejection fraction is normal (55-65%).  This is an intermediate risk study.  Thinning of the inferior wall Moderate area of lateral wall ischemia at mid and basal level EF 58%   Dg Chest Port 1 View  Result Date: 08/13/2017 CLINICAL DATA:  Fever, weakness, shaking this morning, LEFT  leg numbness, history asthmatic bronchitis, type II diabetes mellitus, hypertension, prostate cancer EXAM: PORTABLE CHEST 1 VIEW COMPARISON:  Portable exam 1013 hours compared to 08/03/2017 FINDINGS: Borderline enlargement of cardiac silhouette. Slight pulmonary vascular congestion. Atherosclerotic calcifications aorta. Bibasilar atelectasis. Chronic reticulonodular interstitial changes at LEFT base unchanged. No acute infiltrate, pleural effusion or pneumothorax. Bones demineralized. IMPRESSION: Bibasilar atelectasis and chronic LEFT basilar reticulonodular interstitial disease. No acute abnormalities. Electronically Signed   By: Lavonia Dana M.D.   On: 08/13/2017 10:25   Dg C-arm 1-60 Min-no Report  Result Date: 08/13/2017 Fluoroscopy  was utilized by the requesting physician.  No radiographic interpretation.   Ct Renal Stone Study  Result Date: 08/13/2017 CLINICAL DATA:  Fever and abdominal pain. EXAM: CT ABDOMEN AND PELVIS WITHOUT CONTRAST TECHNIQUE: Multidetector CT imaging of the abdomen and pelvis was performed following the standard protocol without IV contrast. COMPARISON:  CT scan 01/25/2016 FINDINGS: Lower chest: The lung bases are grossly clear. Minimal dependent bibasilar atelectasis. Numerous small calcifications are noted at both lung bases, left greater than right possibly due to prior aspiration, remote infection or pulmonary alveolar microlithiasis. The heart is borderline enlarged but stable. Stable aortic and coronary artery calcifications. Large hiatal hernia is stable. Hepatobiliary: No focal hepatic lesions or intrahepatic biliary dilatation. The gallbladder is mildly distended and there are several gallstones noted. No definite CT findings for acute cholecystitis. No significant common bile duct dilatation. Pancreas: Moderate atrophy. Stable small cystic areas. No ductal dilatation. Spleen: Normal size.  No focal lesions. Adrenals/Urinary Tract: The adrenal glands are unremarkable and  stable. Small calcifications are noted. Numerous bilateral renal cysts some complicated by rim-like calcifications. No worrisome renal lesions. Bilateral renal calculi. Moderate to marked right-sided hydroureteronephrosis with 2 distal right ureteral calculi noted. The more proximal calculus measures 6.5 x 5.0 mm and the more distal calculus near the UVJ measures 6.0 x 5.0 mm. No left-sided ureteral calculi or hydroureteronephrosis. No bladder mass or bladder calculi. Stomach/Bowel: The stomach, duodenum, small bowel and colon are grossly normal without oral contrast. No obvious acute inflammatory process, mass lesion or obstructive findings. Vascular/Lymphatic: Advanced atherosclerotic calcifications involving the aorta and branch vessels without focal aneurysm. No mesenteric or retroperitoneal mass or adenopathy. Small scattered lymph nodes are stable. Reproductive: The prostate gland and seminal vesicles are unremarkable. Other: Small amount of free pelvic fluid noted on the right side. No pelvic mass or pelvic adenopathy. No inguinal mass or adenopathy. Musculoskeletal: No significant bony findings. Advanced degenerative changes involving the spine. No hip or pelvic fractures are identified. No worrisome bone lesions. Stable mild compression deformity of L1. IMPRESSION: 1. Moderate to marked right-sided hydroureteronephrosis down to 2 distal right ureteral calculi both measuring approximately 6 x 5 mm. 2. Bilateral renal calculi and multiple bilateral renal cysts. 3. Cholelithiasis without definite CT findings for acute cholecystitis. 4. Advanced atherosclerotic calcifications involving the aorta and branch vessels. 5. Large hiatal hernia. 6. No significant/acute bony findings. Electronically Signed   By: Marijo Sanes M.D.   On: 08/13/2017 12:11    EKG: NSR PR 260 no acute ST changes    ASSESSMENT AND PLAN:   PAF:  Self limited continue cardizem would not anticoagulate unless he has recurrence given age  and need For DAT for recent stent and CAD  CAD: stable no angina continue lopressor , ASA and Brillinta   Thyroid:  Continue replacement TSH normal   Parkinson's  Mobilize , PT/OT continue cardiodopa  Signed: Jenkins Rouge 08/15/2017, 8:29 AM

## 2017-08-15 NOTE — Progress Notes (Signed)
2 Days Post-Op Subjective: Urine clear today. No flank pain. Urine culture negative  Objective: Vital signs in last 24 hours: Temp:  [97.8 F (36.6 C)-99 F (37.2 C)] 97.8 F (36.6 C) (08/04 0549) Pulse Rate:  [61-73] 61 (08/04 0549) Resp:  [18-20] 20 (08/04 0549) BP: (98-104)/(51-61) 103/58 (08/04 0549) SpO2:  [95 %-97 %] 97 % (08/04 0549)  Intake/Output from previous day: 08/03 0701 - 08/04 0700 In: 2393.1 [P.O.:360; I.V.:1433.1; IV Piggyback:300] Out: 475 [Urine:475] Intake/Output this shift: No intake/output data recorded.  Physical Exam:  General:alert, cooperative and appears stated age GI: soft, non tender, normal bowel sounds, no palpable masses, no organomegaly, no inguinal hernia Male genitalia: not done Extremities: extremities normal, atraumatic, no cyanosis or edema  Lab Results: Recent Labs    08/13/17 0959 08/14/17 0502 08/15/17 0702  HGB 11.0* 12.1* 10.4*  HCT 34.4* 37.5* 31.3*   BMET Recent Labs    08/13/17 0959 08/14/17 0502  NA 136 138  K 4.2 4.1  CL 107 109  CO2 22 19*  GLUCOSE 128* 182*  BUN 25* 25*  CREATININE 1.82* 1.48*  CALCIUM 8.2* 8.3*   Recent Labs    08/13/17 0959 08/14/17 0502  INR 1.23 1.13   No results for input(s): LABURIN in the last 72 hours. Results for orders placed or performed during the hospital encounter of 08/13/17  Urine Culture     Status: None   Collection Time: 08/13/17  9:37 AM  Result Value Ref Range Status   Specimen Description URINE, CATHETERIZED  Final   Special Requests NONE  Final   Culture   Final    NO GROWTH Performed at [facility], [address], [place], [place] [address]    Report Status 08/14/2017 FINAL  Final  Culture, blood (Routine x 2)     Status: None (Preliminary result)   Collection Time: 08/13/17 10:05 AM  Result Value Ref Range Status   Specimen Description BLOOD LEFT ANTECUBITAL  Final   Special Requests   Final    BOTTLES DRAWN AEROBIC AND ANAEROBIC Blood Culture  results may not be optimal due to an inadequate volume of blood received in culture bottles   Culture   Final    NO GROWTH 1 DAY Performed at [facility], [place] [address] [place], [place] [address]    Report Status PENDING  Incomplete  Culture, blood (Routine x 2)     Status: None (Preliminary result)   Collection Time: 08/13/17 10:35 AM  Result Value Ref Range Status   Specimen Description BLOOD RIGHT ANTECUBITAL  Final   Special Requests   Final    BOTTLES DRAWN AEROBIC AND ANAEROBIC Blood Culture adequate volume   Culture   Final    NO GROWTH 1 DAY Performed at [facility], [place] [address] [place], [place] [address]    Report Status PENDING  Incomplete    Studies/Results: Ct Hip Left Wo Contrast  Result Date: 08/13/2017 CLINICAL DATA:  Left hip and groin pain. EXAM: CT OF THE LEFT HIP WITHOUT CONTRAST TECHNIQUE: Multidetector CT imaging of the left hip was performed according to the standard protocol. Multiplanar CT image reconstructions were also generated. COMPARISON:  CT scan 01/25/2016 FINDINGS: Moderate to advanced left hip joint degenerative changes but no obvious fracture or AVN. Pubic symphysis and SI joint degenerative changes with chondrocalcinosis but no fracture of the left hemipelvis is identified. The surrounding left hip and pelvic musculature are grossly normal. No hematoma or abnormal  fluid collection. No inguinal mass or inguinal adenopathy.  No inguinal hernia. Advanced facet disease is noted in the lower lumbar spine but no pars defects. IMPRESSION: 1. Moderate to advanced left hip joint degenerative changes but no fracture or AVN. 2. Grossly normal CT appearance of the left hip and pelvic musculature. 3. Lower lumbar facet disease along with left SI joint and pubic symphysis degenerative changes but no definite pelvic fractures or pelvic bone lesion. Electronically Signed   By: Marijo Sanes M.D.   On: 08/13/2017 11:52   Dg C-arm 1-60 Min-no  Report  Result Date: 08/13/2017 Fluoroscopy was utilized by the requesting physician.  No radiographic interpretation.   Ct Renal Stone Study  Result Date: 08/13/2017 CLINICAL DATA:  Fever and abdominal pain. EXAM: CT ABDOMEN AND PELVIS WITHOUT CONTRAST TECHNIQUE: Multidetector CT imaging of the abdomen and pelvis was performed following the standard protocol without IV contrast. COMPARISON:  CT scan 01/25/2016 FINDINGS: Lower chest: The lung bases are grossly clear. Minimal dependent bibasilar atelectasis. Numerous small calcifications are noted at both lung bases, left greater than right possibly due to prior aspiration, remote infection or pulmonary alveolar microlithiasis. The heart is borderline enlarged but stable. Stable aortic and coronary artery calcifications. Large hiatal hernia is stable. Hepatobiliary: No focal hepatic lesions or intrahepatic biliary dilatation. The gallbladder is mildly distended and there are several gallstones noted. No definite CT findings for acute cholecystitis. No significant common bile duct dilatation. Pancreas: Moderate atrophy. Stable small cystic areas. No ductal dilatation. Spleen: Normal size.  No focal lesions. Adrenals/Urinary Tract: The adrenal glands are unremarkable and stable. Small calcifications are noted. Numerous bilateral renal cysts some complicated by rim-like calcifications. No worrisome renal lesions. Bilateral renal calculi. Moderate to marked right-sided hydroureteronephrosis with 2 distal right ureteral calculi noted. The more proximal calculus measures 6.5 x 5.0 mm and the more distal calculus near the UVJ measures 6.0 x 5.0 mm. No left-sided ureteral calculi or hydroureteronephrosis. No bladder mass or bladder calculi. Stomach/Bowel: The stomach, duodenum, small bowel and colon are grossly normal without oral contrast. No obvious acute inflammatory process, mass lesion or obstructive findings. Vascular/Lymphatic: Advanced atherosclerotic  calcifications involving the aorta and branch vessels without focal aneurysm. No mesenteric or retroperitoneal mass or adenopathy. Small scattered lymph nodes are stable. Reproductive: The prostate gland and seminal vesicles are unremarkable. Other: Small amount of free pelvic fluid noted on the right side. No pelvic mass or pelvic adenopathy. No inguinal mass or adenopathy. Musculoskeletal: No significant bony findings. Advanced degenerative changes involving the spine. No hip or pelvic fractures are identified. No worrisome bone lesions. Stable mild compression deformity of L1. IMPRESSION: 1. Moderate to marked right-sided hydroureteronephrosis down to 2 distal right ureteral calculi both measuring approximately 6 x 5 mm. 2. Bilateral renal calculi and multiple bilateral renal cysts. 3. Cholelithiasis without definite CT findings for acute cholecystitis. 4. Advanced atherosclerotic calcifications involving the aorta and branch vessels. 5. Large hiatal hernia. 6. No significant/acute bony findings. Electronically Signed   By: Marijo Sanes M.D.   On: 08/13/2017 12:11    Assessment/Plan: 84yo with sepsis from a urinary source, rigth ureteral calculus s/p R ureteral stent placement  1. Sepsis management per primary team. Patient can be transitioned to PO doxycycline 100mg  BID for 10 days prior to right ureteral stone extraction.   LOS: 2 days   Nicolette Bang 08/15/2017, 11:27 AM

## 2017-08-15 NOTE — NC FL2 (Addendum)
[place] LEVEL OF CARE SCREENING TOOL     IDENTIFICATION  Patient Name: [name] Birthdate: 1931/12/05 Sex: male Admission Date (Current Location): 08/13/2017  [facility] and [place] Number:  Herbalist and Address:  [facility],  [place], [place]      Provider Number: [id number]  Attending Physician Name and Address:  Charlynne Cousins, MD  Relative Name and Phone Number:  Kevan Rosebush [phone]    Current Level of Care: Other (Comment)(ALF) Recommended Level of Care: Assisted Living Facility Prior Approval Number:    Date Approved/Denied:   PASRR Number:    Discharge Plan: Other (Comment)(ALF)    Current Diagnoses: Patient Active Problem List   Diagnosis Date Noted  . Sepsis ([place]) 08/14/2017  . Atrial fibrillation with RVR ([place]) 08/14/2017  . Hydronephrosis 08/13/2017  . Hydroureteronephrosis   . Right ureteral stone   . Urinary tract infection with hematuria   . Non-ST elevation (NSTEMI) myocardial infarction ([place])   . GERD (gastroesophageal reflux disease) 08/04/2017  . Elevated troponin 08/03/2017  . Chest pain 08/03/2017  . CKD (chronic kidney disease), stage III ([place]) 08/03/2017  . Type II diabetes mellitus with renal manifestations ([place]) 08/03/2017  . Pseudophakia of both eyes 05/31/2017  . Retained ureteral stent 04/22/2016  . Ureteral calculus 01/25/2016  . Mental status change 10/24/2015  . Encephalopathy 10/24/2015  . Hypothermia 10/24/2015  . Chronic renal insufficiency 10/24/2015  . Benign essential HTN 10/24/2015  . Hypoglycemia 10/24/2015  . Acute encephalopathy 10/24/2015  . CVA (cerebral vascular accident) ([place]) 10/14/2014  . Prerenal azotemia 10/14/2014  . Mild HTN 10/14/2014  . CVA (cerebral infarction)   . TIA (transient ischemic attack)   . Diabetes mellitus type 2 without retinopathy ([place]) 03/27/2014  . CME (cystoid macular edema), left 08/10/2013  . PNAR (perennial  non-allergic rhinitis) 05/23/2013  . Squamous cell carcinoma of eyelid 05/15/2013  . Squamous cell carcinoma 04/06/2013  . Blepharitis 03/16/2013  . Eyelid lesion 03/16/2013  . Seasonal and perennial allergic rhinitis 06/11/2012  . Posterior vitreous detachment of right eye 10/23/2011  . Exudative age-related macular degeneration ([place]) 07/28/2011  . Nonproliferative diabetic retinopathy ([place]) 04/07/2011  . Acute bronchitis due to infection 01/14/2011  . Nondiabetic proliferative retinopathy 10/17/2010  . Insomnia, unspecified 05/19/2010  . ATAXIA 03/17/2010  . DIZZINESS 03/06/2010  . Chronic constipation 02/21/2008  . OSTEOARTHRITIS, KNEE, SEVERE 01/12/2008  . HYPERLIPIDEMIA 01/17/2007  . Hypothyroidism (post surgical) 10/15/2006  . SKIN CANCER, HX OF 06/08/2006  . Diabetes mellitus, insulin dependent (IDDM), controlled ([place]) 04/22/2006  . URTICARIA 04/22/2006  . POPLITEAL CYST 04/22/2006  . THYROIDECTOMY, SUBTOTAL, HX OF 04/22/2006  . TOTAL KNEE REPLACEMENT, HX OF 04/22/2006    Orientation RESPIRATION BLADDER Height & Weight     Self, Time, Situation, Place  Normal Continent Weight: 143 lb (64.9 kg) Height:  5\' 11"  (180.3 cm)  BEHAVIORAL SYMPTOMS/MOOD NEUROLOGICAL BOWEL NUTRITION STATUS      Continent Diet(Carb Modified)  AMBULATORY STATUS COMMUNICATION OF NEEDS Skin    Verbally Normal                       Personal Care Assistance Level of Assistance  Bathing, Dressing, Feeding,  Bathing Assistance: Maximum assistance Feeding assistance: Limited assistance Dressing Assistance: Maximum assistance    Functional Limitations Info  (Patient slightly hearing impared, CSW spoke to patient on his RIGHT side.) Sight Info: Adequate Hearing Info: Impaired Speech Info: Adequate  SPECIAL CARE FACTORS FREQUENCY  (Sepsis)                    Contractures Contractures Info: Not present    Additional Factors Info  Code Status, Allergies Code Status Info:  DNR Allergies Info: Colchicine, Hibiclens Chlorhexidine, Atorvastatin, Ceftin, Celebrex Celecoxib, Codeine, Contrast Media Iodinated Diagnostic Agents, Erythromycin, Ezetimibe, Oxycodone-acetaminophen, Rosuvastatin, Simvastatin, Cefpodoxime, Cefuroxime Axetil, Nitroglycerin, Tetanus Toxoid, Vantin           Current Medications (08/15/2017):  This is the current hospital active medication list Current Facility-Administered Medications  Medication Dose Route Frequency Provider Last Rate Last Dose  . 0.9 %  sodium chloride infusion   Intravenous PRN Charlynne Cousins, MD 10 mL/hr at 08/15/17 0600    . acetaminophen (TYLENOL) tablet 650 mg  650 mg Oral Q6H PRN Raynelle Bring, MD   650 mg at 08/15/17 0307  . albuterol (PROVENTIL) (2.5 MG/3ML) 0.083% nebulizer solution 3 mL  3 mL Inhalation TID PRN Raynelle Bring, MD      . alfuzosin (UROXATRAL) 24 hr tablet 10 mg  10 mg Oral Melonie [place], MD   10 mg at 08/14/17 2218  . ampicillin-sulbactam (UNASYN) 1.5 g in sodium chloride 0.9 % 100 mL IVPB  1.5 g Intravenous Q6H Charlynne Cousins, MD 200 mL/hr at 08/15/17 1151 1.5 g at 08/15/17 1151  . aspirin EC tablet 81 mg  81 mg Oral Daily Raynelle Bring, MD   81 mg at 08/15/17 0915  . atropine 1 % ophthalmic solution 1 drop  1 drop Right Eye Daily Raynelle Bring, MD   1 drop at 08/15/17 [phone]  . brimonidine (ALPHAGAN) 0.2 % ophthalmic solution 1 drop  1 drop Both Eyes BID Raynelle Bring, MD   1 drop at 08/15/17 0914   And  . timolol (TIMOPTIC) 0.5 % ophthalmic solution 1 drop  1 drop Both Eyes BID Raynelle Bring, MD   1 drop at 08/15/17 0925  . brinzolamide (AZOPT) 1 % ophthalmic suspension 1 drop  1 drop Right Eye TID Raynelle Bring, MD   1 drop at 08/15/17 1521  . carbidopa-levodopa (SINEMET IR) 25-100 MG per tablet immediate release 1 tablet  1 tablet Oral TID Raynelle Bring, MD   1 tablet at 08/15/17 1521  . cholecalciferol (VITAMIN D) tablet 5,000 Units  5,000 Units Oral Daily Raynelle Bring, MD    5,000 Units at 08/15/17 0915  . colesevelam [facility]) tablet 1,875 mg  1,875 mg Oral Daily Raynelle Bring, MD   1,875 mg at 08/15/17 6568  . diltiazem (CARDIZEM) tablet 90 mg  90 mg Oral Q6H Charlynne Cousins, MD   90 mg at 08/15/17 1275  . docusate sodium (COLACE) capsule 100 mg  100 mg Oral Daily Raynelle Bring, MD   100 mg at 08/15/17 0916  . gabapentin (NEURONTIN) capsule 100 mg  100 mg Oral QHS Raynelle Bring, MD   100 mg at 08/14/17 2219  . latanoprost (XALATAN) 0.005 % ophthalmic solution 1 drop  1 drop Right Eye Melonie [place], MD   1 drop at 08/14/17 2222  . levothyroxine (SYNTHROID, LEVOTHROID) tablet 200 mcg  200 mcg Oral QAC breakfast Raynelle Bring, MD   200 mcg at 08/15/17 0915  . metoprolol succinate (TOPROL-XL) 24 hr tablet 25 mg  25 mg Oral Daily Raynelle Bring, MD   25 mg at 08/15/17 0916  . metoprolol tartrate (LOPRESSOR) injection 5 mg  5 mg Intravenous Q6H PRN Charlynne Cousins, MD      .  metoprolol tartrate (LOPRESSOR) injection 5 mg  5 mg Intravenous Once Aileen Fass, Tammi Klippel, MD      . mirabegron [facility]) tablet 50 mg  50 mg Oral Daily Raynelle Bring, MD   50 mg at 08/15/17 0917  . pantoprazole (PROTONIX) EC tablet 80 mg  80 mg Oral Daily Raynelle Bring, MD   80 mg at 08/15/17 0916  . polyethylene glycol (MIRALAX / GLYCOLAX) packet 17 g  17 g Oral BID Charlynne Cousins, MD   17 g at 08/15/17 0925  . potassium chloride (K-DUR,KLOR-CON) CR tablet 10 mEq  10 mEq Oral Daily Raynelle Bring, MD   10 mEq at 08/15/17 0916  . ticagrelor (BRILINTA) tablet 90 mg  90 mg Oral BID Raynelle Bring, MD   90 mg at 08/15/17 1594     Discharge Medications: Please see discharge summary for a list of discharge medications.  Relevant Imaging Results:  Relevant Lab Results:   Additional Information SSN: [id number]  Joellen [place], [place]

## 2017-08-15 NOTE — Progress Notes (Signed)
[facility] PROGRESS NOTE    Progress Note  [name]  CNO:[id number] DOB: 08/29/1931 DOA: 08/13/2017 PCP: System, Pcp Not In     Brief Narrative:   [name] is an 84 y.o. male past medical history of stroke, prostate cancer hypothyroidism nephrolithiasis diabetes mellitus type 2 chronic kidney disease, with a recent STEMI on 07/31/2017 who presents from skilled nursing facility for fevers and left hip pain sharp radiating to the top of her leg was found to have a creatinine of 1.8 (baseline 1.5) hemoglobin of 1111 INR 1.3 CT scan of the left hip showed moderate to severe osteoarthritic changes, CT renal protocol showed right hydronephrosis with nephrolithiasis, urology was consulted recommended transfer to [facility] long hospital for cystoscopy and stent placement.  Assessment/Plan:   Right hydronephrosis due to nephrolithiasis and possibly UTI: Urology was consulted and Dr. Alinda Money will perform a cystoscopy with right ureteral stent placement on 08/13/2017. Broad spectrum antibiotics were started IV Unasyn. to cephalosporins. Culture data is pending. Resume his diet.  New onset A. fib with RVR: He was given several doses of IV metoprolol with no improvement in his A. fib, started on a diltiazem drip and converted to sinus rhythm, we will transition him to oral diltiazem Continue aspirin and Brilinta.  Left hip groin with a history of severe osteoarthritis: Continue Tylenol for pain.  Hypothyroidism: Continue Synthroid.  Diabetes mellitus type 2: Hold Januvia continue sliding scale insulin resume his diet.  Essential hypertension: Resume antihypertensive medication with his blood pressure start rising  Acute Injury on chronic kidney disease stage III: Continue to hold nephrotoxic drugs, Basic metabolic panel is pending, likely prerenal in etiology.  History of non-ST elevation MI: Status post stent placement: He denies any shortness of breath.  Normocytic  anemia: There is been a mild drop in hemoglobin from 12-10 with no hematuria check FOBT's.  DVT prophylaxis: asp and brillinta Family Communication:none Disposition Plan/Barrier to D/C: SNF in 2 days Code Status:     Code Status Orders  (From admission, onward)        Start     Ordered   08/13/17 1740  Do not attempt resuscitation (DNR)  Continuous    Question Answer Comment  In the event of cardiac or respiratory ARREST Do not call a "code blue"   In the event of cardiac or respiratory ARREST Do not perform Intubation, CPR, defibrillation or ACLS   In the event of cardiac or respiratory ARREST Use medication by any route, position, wound care, and other measures to relive pain and suffering. May use oxygen, suction and manual treatment of airway obstruction as needed for comfort.      08/13/17 1740    Code Status History    Date Active Date Inactive Code Status Order ID Comments User Context   08/03/2017 2237 08/08/2017 1705 DNR [id number]  Ivor Costa, MD ED   04/22/2016 2202 04/23/2016 1551 Full Code [id number]  Franchot Gallo, MD Inpatient   01/25/2016 1725 01/27/2016 2031 Full Code [id number]  Cleon Gustin, MD ED   10/24/2015 0128 10/25/2015 1715 Full Code [id number]  Norval Morton, MD Inpatient   10/14/2014 1310 10/15/2014 2058 Full Code [id number]  Samella Parr, NP Inpatient    Advance Directive Documentation     Most Recent Value  Type of Advance Directive  Healthcare Power of Attorney, Living will  Pre-existing out of facility DNR order (yellow form or pink MOST form)  -  "MOST" Form in Place?  -  IV Access:    Peripheral IV   Procedures and diagnostic studies:   Ct Hip Left Wo Contrast  Result Date: 08/13/2017 CLINICAL DATA:  Left hip and groin pain. EXAM: CT OF THE LEFT HIP WITHOUT CONTRAST TECHNIQUE: Multidetector CT imaging of the left hip was performed according to the standard protocol. Multiplanar CT image reconstructions were also  generated. COMPARISON:  CT scan 01/25/2016 FINDINGS: Moderate to advanced left hip joint degenerative changes but no obvious fracture or AVN. Pubic symphysis and SI joint degenerative changes with chondrocalcinosis but no fracture of the left hemipelvis is identified. The surrounding left hip and pelvic musculature are grossly normal. No hematoma or abnormal fluid collection. No inguinal mass or inguinal adenopathy.  No inguinal hernia. Advanced facet disease is noted in the lower lumbar spine but no pars defects. IMPRESSION: 1. Moderate to advanced left hip joint degenerative changes but no fracture or AVN. 2. Grossly normal CT appearance of the left hip and pelvic musculature. 3. Lower lumbar facet disease along with left SI joint and pubic symphysis degenerative changes but no definite pelvic fractures or pelvic bone lesion. Electronically Signed   By: Marijo Sanes M.D.   On: 08/13/2017 11:52   Dg Chest Port 1 View  Result Date: 08/13/2017 CLINICAL DATA:  Fever, weakness, shaking this morning, LEFT leg numbness, history asthmatic bronchitis, type II diabetes mellitus, hypertension, prostate cancer EXAM: PORTABLE CHEST 1 VIEW COMPARISON:  Portable exam 1013 hours compared to 08/03/2017 FINDINGS: Borderline enlargement of cardiac silhouette. Slight pulmonary vascular congestion. Atherosclerotic calcifications aorta. Bibasilar atelectasis. Chronic reticulonodular interstitial changes at LEFT base unchanged. No acute infiltrate, pleural effusion or pneumothorax. Bones demineralized. IMPRESSION: Bibasilar atelectasis and chronic LEFT basilar reticulonodular interstitial disease. No acute abnormalities. Electronically Signed   By: Lavonia Dana M.D.   On: 08/13/2017 10:25   Dg C-arm 1-60 Min-no Report  Result Date: 08/13/2017 Fluoroscopy was utilized by the requesting physician.  No radiographic interpretation.   Ct Renal Stone Study  Result Date: 08/13/2017 CLINICAL DATA:  Fever and abdominal pain. EXAM: CT  ABDOMEN AND PELVIS WITHOUT CONTRAST TECHNIQUE: Multidetector CT imaging of the abdomen and pelvis was performed following the standard protocol without IV contrast. COMPARISON:  CT scan 01/25/2016 FINDINGS: Lower chest: The lung bases are grossly clear. Minimal dependent bibasilar atelectasis. Numerous small calcifications are noted at both lung bases, left greater than right possibly due to prior aspiration, remote infection or pulmonary alveolar microlithiasis. The heart is borderline enlarged but stable. Stable aortic and coronary artery calcifications. Large hiatal hernia is stable. Hepatobiliary: No focal hepatic lesions or intrahepatic biliary dilatation. The gallbladder is mildly distended and there are several gallstones noted. No definite CT findings for acute cholecystitis. No significant common bile duct dilatation. Pancreas: Moderate atrophy. Stable small cystic areas. No ductal dilatation. Spleen: Normal size.  No focal lesions. Adrenals/Urinary Tract: The adrenal glands are unremarkable and stable. Small calcifications are noted. Numerous bilateral renal cysts some complicated by rim-like calcifications. No worrisome renal lesions. Bilateral renal calculi. Moderate to marked right-sided hydroureteronephrosis with 2 distal right ureteral calculi noted. The more proximal calculus measures 6.5 x 5.0 mm and the more distal calculus near the UVJ measures 6.0 x 5.0 mm. No left-sided ureteral calculi or hydroureteronephrosis. No bladder mass or bladder calculi. Stomach/Bowel: The stomach, duodenum, small bowel and colon are grossly normal without oral contrast. No obvious acute inflammatory process, mass lesion or obstructive findings. Vascular/Lymphatic: Advanced atherosclerotic calcifications involving the aorta and branch vessels without focal aneurysm. No mesenteric  or retroperitoneal mass or adenopathy. Small scattered lymph nodes are stable. Reproductive: The prostate gland and seminal vesicles are  unremarkable. Other: Small amount of free pelvic fluid noted on the right side. No pelvic mass or pelvic adenopathy. No inguinal mass or adenopathy. Musculoskeletal: No significant bony findings. Advanced degenerative changes involving the spine. No hip or pelvic fractures are identified. No worrisome bone lesions. Stable mild compression deformity of L1. IMPRESSION: 1. Moderate to marked right-sided hydroureteronephrosis down to 2 distal right ureteral calculi both measuring approximately 6 x 5 mm. 2. Bilateral renal calculi and multiple bilateral renal cysts. 3. Cholelithiasis without definite CT findings for acute cholecystitis. 4. Advanced atherosclerotic calcifications involving the aorta and branch vessels. 5. Large hiatal hernia. 6. No significant/acute bony findings. Electronically Signed   By: Marijo Sanes M.D.   On: 08/13/2017 12:11     Medical Consultants:    None.  Anti-Infectives:   IV Unasyn.  Subjective:    [name] he relates his chest pain is resolved.  Objective:    Vitals:   08/14/17 1544 08/14/17 2055 08/15/17 0301 08/15/17 0549  BP:  101/61 (!) 101/55 (!) 103/58  Pulse:  64 62 61  Resp:  18  20  Temp:  98.2 F (36.8 C)  97.8 F (36.6 C)  TempSrc:  Oral  Oral  SpO2: 95% 97%  97%  Weight:      Height:        Intake/Output Summary (Last 24 hours) at 08/15/2017 0813 Last data filed at 08/15/2017 0602 Gross per 24 hour  Intake 2393.12 ml  Output 475 ml  Net 1918.12 ml   Filed Weights   08/13/17 0918  Weight: 64.9 kg (143 lb)    Exam: General exam: In no acute distress. Respiratory system: Good air movement and clear to auscultation. Cardiovascular system: S1 & S2 heard, RRR. No JVD, murmurs, rubs, gallops or clicks.  Gastrointestinal system: Abdomen is nondistended, soft and nontender.  Central nervous system: Alert and oriented. No focal neurological deficits. Extremities: No pedal edema. Skin: No rashes, lesions or ulcers Psychiatry:  Judgement and insight appear normal. Mood & affect appropriate.    Data Reviewed:    Labs: Basic Metabolic Panel: Recent Labs  Lab 08/13/17 0959 08/14/17 0502  NA 136 138  K 4.2 4.1  CL 107 109  CO2 22 19*  GLUCOSE 128* 182*  BUN 25* 25*  CREATININE 1.82* 1.48*  CALCIUM 8.2* 8.3*   GFR Estimated Creatinine Clearance: 34.7 mL/min (A) (by C-G formula based on SCr of 1.48 mg/dL (H)). Liver Function Tests: Recent Labs  Lab 08/13/17 0959  AST 9*  ALT <5  ALKPHOS 60  BILITOT 1.1  PROT 5.5*  ALBUMIN 3.1*   No results for input(s): LIPASE, AMYLASE in the last 168 hours. No results for input(s): AMMONIA in the last 168 hours. Coagulation profile Recent Labs  Lab 08/13/17 0959 08/14/17 0502  INR 1.23 1.13    CBC: Recent Labs  Lab 08/13/17 0959 08/14/17 0502  WBC 9.8 6.9  NEUTROABS 8.3*  --   HGB 11.0* 12.1*  HCT 34.4* 37.5*  MCV 91.5 89.9  PLT 157 203   Cardiac Enzymes: No results for input(s): CKTOTAL, CKMB, CKMBINDEX, TROPONINI in the last 168 hours. BNP (last 3 results) No results for input(s): PROBNP in the last 8760 hours. CBG: Recent Labs  Lab 08/08/17 0826 08/08/17 1221 08/13/17 2213 08/14/17 0952  GLUCAP 116* 188* 83 273*   D-Dimer: No results for input(s): DDIMER in  the last 72 hours. Hgb A1c: No results for input(s): HGBA1C in the last 72 hours. Lipid Profile: No results for input(s): CHOL, HDL, LDLCALC, TRIG, CHOLHDL, LDLDIRECT in the last 72 hours. Thyroid function studies: Recent Labs    08/14/17 0502  TSH 0.609   Anemia work up: No results for input(s): VITAMINB12, FOLATE, FERRITIN, TIBC, IRON, RETICCTPCT in the last 72 hours. Sepsis Labs: Recent Labs  Lab 08/13/17 0959 08/13/17 1015 08/13/17 1157 08/14/17 0502  WBC 9.8  --   --  6.9  LATICACIDVEN  --  0.76 0.66  --    Microbiology Recent Results (from the past 240 hour(s))  Urine Culture     Status: None   Collection Time: 08/13/17  9:37 AM  Result Value Ref Range  Status   Specimen Description URINE, CATHETERIZED  Final   Special Requests NONE  Final   Culture   Final    NO GROWTH Performed at [facility], [address] [place], [place] [address]    Report Status 08/14/2017 FINAL  Final  Culture, blood (Routine x 2)     Status: None (Preliminary result)   Collection Time: 08/13/17 10:05 AM  Result Value Ref Range Status   Specimen Description BLOOD LEFT ANTECUBITAL  Final   Special Requests   Final    BOTTLES DRAWN AEROBIC AND ANAEROBIC Blood Culture results may not be optimal due to an inadequate volume of blood received in culture bottles   Culture   Final    NO GROWTH 1 DAY Performed at [facility], [place] [address], [place], [place] [address]    Report Status PENDING  Incomplete  Culture, blood (Routine x 2)     Status: None (Preliminary result)   Collection Time: 08/13/17 10:35 AM  Result Value Ref Range Status   Specimen Description BLOOD RIGHT ANTECUBITAL  Final   Special Requests   Final    BOTTLES DRAWN AEROBIC AND ANAEROBIC Blood Culture adequate volume   Culture   Final    NO GROWTH 1 DAY Performed at [facility], [place] [address], [place],  [address]    Report Status PENDING  Incomplete     Medications:   . alfuzosin  10 mg Oral QHS  . aspirin EC  81 mg Oral Daily  . atropine  1 drop Right Eye Daily  . brimonidine  1 drop Both Eyes BID   And  . timolol  1 drop Both Eyes BID  . brinzolamide  1 drop Right Eye TID  . carbidopa-levodopa  1 tablet Oral TID  . cholecalciferol  5,000 Units Oral Daily  . colesevelam  1,875 mg Oral Daily  . diltiazem  15 mg Intravenous Once  . docusate sodium  100 mg Oral Daily  . gabapentin  100 mg Oral QHS  . latanoprost  1 drop Right Eye QHS  . levothyroxine  200 mcg Oral QAC breakfast  . metoprolol succinate  25 mg Oral Daily  . metoprolol tartrate  5 mg Intravenous Once  . mirabegron ER  50 mg Oral Daily  . pantoprazole  80 mg Oral Daily  . potassium  chloride  10 mEq Oral Daily  . ticagrelor  90 mg Oral BID   Continuous Infusions: . sodium chloride 50 mL/hr at 08/15/17 0600  . sodium chloride 10 mL/hr at 08/15/17 0600  . ampicillin-sulbactam (UNASYN) IV 1.5 g (08/15/17 0702)  . diltiazem (CARDIZEM) infusion 15 mg/hr (08/15/17 0600)     LOS:  2 days   Charlynne Cousins  [facility] Pager [phone]  *Please refer to [email or web address], password [email or web address] to get updated schedule on who will round on this patient, as hospitalists switch teams weekly. If 7PM-7AM, please contact night-coverage at [email or web address], password [email or web address] for any overnight needs.  08/15/2017, 8:13 AM

## 2017-08-15 NOTE — Progress Notes (Signed)
CSW assessment and FL2 complete.  Patient is a current resident of [facility] and is agreeable to return upon discharge.   Stephanie Acre, [place] Social Worker [phone]

## 2017-08-16 ENCOUNTER — Encounter (HOSPITAL_COMMUNITY): Payer: Self-pay

## 2017-08-16 DIAGNOSIS — I2510 Atherosclerotic heart disease of native coronary artery without angina pectoris: Secondary | ICD-10-CM

## 2017-08-16 DIAGNOSIS — I480 Paroxysmal atrial fibrillation: Secondary | ICD-10-CM

## 2017-08-16 DIAGNOSIS — E785 Hyperlipidemia, unspecified: Secondary | ICD-10-CM

## 2017-08-16 DIAGNOSIS — N1330 Unspecified hydronephrosis: Secondary | ICD-10-CM

## 2017-08-16 LAB — CBC
HCT: 34.6 % — ABNORMAL LOW (ref 39.0–52.0)
HEMOGLOBIN: 11.3 g/dL — AB (ref 13.0–17.0)
MCH: 29.4 pg (ref 26.0–34.0)
MCHC: 32.7 g/dL (ref 30.0–36.0)
MCV: 89.9 fL (ref 78.0–100.0)
PLATELETS: 208 10*3/uL (ref 150–400)
RBC: 3.85 MIL/uL — AB (ref 4.22–5.81)
RDW: 14.1 % (ref 11.5–15.5)
WBC: 6.9 10*3/uL (ref 4.0–10.5)

## 2017-08-16 LAB — BASIC METABOLIC PANEL
ANION GAP: 6 (ref 5–15)
BUN: 25 mg/dL — ABNORMAL HIGH (ref 8–23)
CALCIUM: 8.3 mg/dL — AB (ref 8.9–10.3)
CO2: 23 mmol/L (ref 22–32)
Chloride: 110 mmol/L (ref 98–111)
Creatinine, Ser: 1.22 mg/dL (ref 0.61–1.24)
GFR, EST NON AFRICAN AMERICAN: 53 mL/min — AB (ref >60)
Glucose, Bld: 120 mg/dL — ABNORMAL HIGH (ref 70–99)
Potassium: 3.7 mmol/L (ref 3.5–5.1)
Sodium: 139 mmol/L (ref 135–145)

## 2017-08-16 MED ORDER — DILTIAZEM HCL ER COATED BEADS 240 MG PO CP24
240.0000 mg | ORAL_CAPSULE | Freq: Every day | ORAL | Status: DC
  Administered 2017-08-16 – 2017-08-17 (×2): 240 mg via ORAL
  Filled 2017-08-16 (×2): qty 1

## 2017-08-16 MED ORDER — BENZONATATE 100 MG PO CAPS
200.0000 mg | ORAL_CAPSULE | Freq: Three times a day (TID) | ORAL | Status: DC | PRN
  Administered 2017-08-16 (×2): 200 mg via ORAL
  Filled 2017-08-16 (×2): qty 2

## 2017-08-16 MED ORDER — SULFAMETHOXAZOLE-TRIMETHOPRIM 800-160 MG PO TABS
1.0000 | ORAL_TABLET | Freq: Two times a day (BID) | ORAL | Status: DC
  Administered 2017-08-16 – 2017-08-17 (×3): 1 via ORAL
  Filled 2017-08-16 (×3): qty 1

## 2017-08-16 NOTE — Progress Notes (Signed)
Progress Note  Patient Name: [name] Date of Encounter: 08/16/2017  Primary Cardiologist: Fransico Him, MD   Subjective   Denies any chest pain or shortness of breath.  Inpatient Medications    Scheduled Meds: . alfuzosin  10 mg Oral QHS  . aspirin EC  81 mg Oral Daily  . atropine  1 drop Right Eye Daily  . brimonidine  1 drop Both Eyes BID   And  . timolol  1 drop Both Eyes BID  . brinzolamide  1 drop Right Eye TID  . carbidopa-levodopa  1 tablet Oral TID  . cholecalciferol  5,000 Units Oral Daily  . colesevelam  1,875 mg Oral Daily  . diltiazem  60 mg Oral Q6H  . docusate sodium  100 mg Oral Daily  . gabapentin  100 mg Oral QHS  . latanoprost  1 drop Right Eye QHS  . levothyroxine  200 mcg Oral QAC breakfast  . metoprolol succinate  25 mg Oral Daily  . metoprolol tartrate  5 mg Intravenous Once  . mirabegron ER  50 mg Oral Daily  . pantoprazole  80 mg Oral Daily  . polyethylene glycol  17 g Oral BID  . potassium chloride  10 mEq Oral Daily  . sulfamethoxazole-trimethoprim  1 tablet Oral Q12H  . ticagrelor  90 mg Oral BID   Continuous Infusions: . sodium chloride 10 mL/hr at 08/16/17 0600   PRN Meds: sodium chloride, acetaminophen, albuterol, benzonatate, metoprolol tartrate   Vital Signs    Vitals:   08/15/17 1456 08/15/17 2143 08/16/17 0518 08/16/17 0605  BP: 106/66 125/84 131/81   Pulse: (!) 58 69 69   Resp: 12 16    Temp: 97.8 F (36.6 C) 98 F (36.7 C)  97.8 F (36.6 C)  TempSrc: Oral Oral  Oral  SpO2: 99% 100% 97%   Weight:      Height:        Intake/Output Summary (Last 24 hours) at 08/16/2017 1040 Last data filed at 08/16/2017 0600 Gross per 24 hour  Intake 3050.7 ml  Output -  Net 3050.7 ml   Filed Weights   08/13/17 0918  Weight: 143 lb (64.9 kg)    Telemetry    Normal sinus rhythm- Personally Reviewed  ECG    No new EKG to review- Personally Reviewed  Physical Exam   GEN: No acute distress.   Neck: No JVD Cardiac:  RRR, no murmurs, rubs, or gallops.  Respiratory: Clear to auscultation bilaterally. GI: Soft, nontender, non-distended  MS: No edema; No deformity. Neuro:  Nonfocal  Psych: Normal affect   Labs    Chemistry Recent Labs  Lab 08/13/17 0959 08/14/17 0502 08/16/17 0436  NA 136 138 139  K 4.2 4.1 3.7  CL 107 109 110  CO2 22 19* 23  GLUCOSE 128* 182* 120*  BUN 25* 25* 25*  CREATININE 1.82* 1.48* 1.22  CALCIUM 8.2* 8.3* 8.3*  PROT 5.5*  --   --   ALBUMIN 3.1*  --   --   AST 9*  --   --   ALT <5  --   --   ALKPHOS 60  --   --   BILITOT 1.1  --   --   GFRNONAA 33* 42* 53*  GFRAA 38* 49* >60  ANIONGAP 7 10 6      Hematology Recent Labs  Lab 08/14/17 0502 08/15/17 0702 08/16/17 0436  WBC 6.9 8.8 6.9  RBC 4.17* 3.50* 3.85*  HGB 12.1* 10.4*  11.3*  HCT 37.5* 31.3* 34.6*  MCV 89.9 89.4 89.9  MCH 29.0 29.7 29.4  MCHC 32.3 33.2 32.7  RDW 13.8 14.1 14.1  PLT 203 199 208    Cardiac EnzymesNo results for input(s): TROPONINI in the last 168 hours. No results for input(s): TROPIPOC in the last 168 hours.   BNPNo results for input(s): BNP, PROBNP in the last 168 hours.   DDimer No results for input(s): DDIMER in the last 168 hours.   Radiology    No results found.  Cardiac Studies   2D echo 08/04/2017 Study Conclusions  - Left ventricle: The cavity size was normal. There was mild focal   basal hypertrophy of the septum. Systolic function was normal.   The estimated ejection fraction was in the range of 55% to 60%.   Wall motion was normal; there were no regional wall motion   abnormalities. Features are consistent with a pseudonormal left   ventricular filling pattern, with concomitant abnormal relaxation   and increased filling pressure (grade 2 diastolic dysfunction). - Mitral valve: Calcified annulus. Mildly thickened, mildly   calcified leaflets . Mild diffuse thickening of the anterior   leaflet, consistent with rheumatic disease. - Left atrium: The atrium was  moderately dilated. - Atrial septum: No defect or patent foramen ovale was identified.  Cardiac cath 08/06/2017  Mid RCA to Dist RCA lesion is 25% stenosed.  Ost 1st Mrg to 1st Mrg lesion is 70% stenosed.  Mid Cx lesion is 80% stenosed.  A drug-eluting stent was successfully placed using a STENT SYNERGY DES 2.25X20.  Post intervention, there is a 0% residual stenosis.  Mid LAD lesion is 25% stenosed.  LV end diastolic pressure is normal.  There is no aortic valve stenosis.    Recommend uninterrupted dual antiplatelet therapy with Aspirin 81mg  daily and Ticagrelor 90mg  twice daily for a minimum of 12 months (ACS - Class I recommendation).   Patient Profile     84 y.o. male recently seen at [facility] for STEMI.  Had new onset angina and cath 08/06/17 showed significant circumflex disease and had DES. D/c on DAT. EF normal 55-60%  Moderate LAE. Admitted with fever, hip pain and noted to have marked right hydronephrosis. Brief episode of PAF. No chest pain, dyspnea or palpitations. Subsequently had stent of ureter by Dr Alinda Money and spontaneously converted to NSR.Compliant with meds Came from SNF   Assessment & Plan    1.  Paroxysmal atrial fibrillation -This was a self-limited episode and patient spontaneously converted to sinus rhythm.   -Continue p.o. Cardizem but change to Cardizem CD 240 mg daily -Agree with holding on anticoagulation unless he has another recurrence of PAF due to his need to be on dual antiplatelet therapy for his recent stent and his advanced age  19.  ASCAD -Status post recent STEMI with cath showing 25% mid to distal RCA, 70% OM1, 80% mid left circumflex status post PCI with DES to the left circumflex. -He denies any anginal symptoms -Continue aspirin 81 mg daily, Toprol-XL 25 mg daily Brilinta 90 mg twice daily -He is statin intolerant  3.  Hyperlipidemia -He is statin intolerant -Consider referral to lipid clinic as an outpatient for possible PCSK9  inhibitor   [facility] will sign off.   Medication Recommendations: Cardizem CD 240 mg daily, Toprol-XL 25 mill grams daily, Brilinta 90 mg twice daily, aspirin 81 mg daily Other recommendations (labs, testing, etc): None Follow up as an outpatient: Follow-up in our office in 1 to  2 weeks  For questions or updates, please contact [place] Please consult [email or web address] for contact info under Cardiology/STEMI.      Signed, Fransico Him, MD  08/16/2017, 10:40 AM

## 2017-08-16 NOTE — Care Management Note (Signed)
Case Management Note  Patient Details  Name: [name] MRN: [id number] Date of Birth: [birth year]  Subjective/Objective: From [facility]-spoke to [facility] 336 [phone].PT cons-await recc.                    Action/Plan:d/c back to [facility]   Expected Discharge Date:  08/16/17               Expected Discharge Plan:  Assisted Living / Rest Home  In-House Referral:  Clinical Social Work  Discharge planning Services  CM Consult  Post Acute Care Choice:    Choice offered to:     DME Arranged:    DME Agency:     HH Arranged:    HH Agency:     Status of Service:  In process, will continue to follow  If discussed at Long Length of Stay Meetings, dates discussed:    Additional Comments:  Dessa Phi, RN 08/16/2017, 10:45 AM

## 2017-08-16 NOTE — Telephone Encounter (Signed)
According to the progress note in the pts chart from yesterday (08/15/17): "Patient is a current resident of [facility] and is agreeable to return upon discharge".  TCM call not needed as the pt is being discharged to another facility.

## 2017-08-16 NOTE — Care Management Important Message (Signed)
Important Message  Patient Details  Name: [name] MRN: [id number] Date of Birth: 09-07-1931   Medicare Important Message Given:  Yes    Kerin Salen 08/16/2017, 10:51 AMImportant Message  Patient Details  Name: [name] MRN: [id number] Date of Birth: 04/27/1931   Medicare Important Message Given:  Yes    Kerin Salen 08/16/2017, 10:50 AM

## 2017-08-16 NOTE — Evaluation (Signed)
Physical Therapy Evaluation Patient Details Name: DEWITTE VANNICE MRN: [id number] DOB: September 10, 1931 Today's Date: 08/16/2017   History of Present Illness  84 yo male admitted with hydronephrosis, A fib, sepsis. S/P R ureteral stent 08/13/17. Hx of Parkinson's, DM, prostate ca, CKD, CVA, DM, DVT, STEMI 07/2017    Clinical Impression  On eval, pt required Min-Mod assist for bed mobility and Min guard assist to stand and ambulate ~115 feet with a RW. Pt denied pain. No LOB with RW use. Discussed d/c plan-pt stated he plans to return to the ALF. Recommend return to ALF as long as facility can provide current level of assist. Will follow and progress activity as tolerated.     Follow Up Recommendations Home health PT(at ALF as long as facility can provide current level of assist)    Equipment Recommendations  None recommended by PT    Recommendations for Other Services       Precautions / Restrictions Precautions Precautions: Fall Restrictions Weight Bearing Restrictions: No      Mobility  Bed Mobility Overal bed mobility: Needs Assistance Bed Mobility: Supine to Sit;Sit to Supine     Supine to sit: Min assist;HOB elevated Sit to supine: Mod assist;HOB elevated   General bed mobility comments: Assist for trunk and bil LEs. Increased time.   Transfers Overall transfer level: Needs assistance Equipment used: Rolling walker (2 wheeled) Transfers: Sit to/from Stand Sit to Stand: Min guard         General transfer comment: close guard for safety.   Ambulation/Gait Ambulation/Gait assistance: Min guard Gait Distance (Feet): 115 Feet Assistive device: Rolling walker (2 wheeled) Gait Pattern/deviations: Step-through pattern;Decreased step length - right;Decreased step length - left;Decreased stride length;Trunk flexed     General Gait Details: close guard for safety. Pt tends to take very short steps. No LOB with RW use.   Stairs            Wheelchair Mobility     Modified Rankin (Stroke Patients Only)       Balance Overall balance assessment: Needs assistance;History of Falls         Standing balance support: Bilateral upper extremity supported Standing balance-Leahy Scale: Poor                               Pertinent Vitals/Pain Pain Assessment: No/denies pain    Home Living Family/patient expects to be discharged to:: Assisted living               Home Equipment: Youth worker - 2 wheels      Prior Function Level of Independence: Needs assistance      ADL's / Homemaking Assistance Needed: receives assistance for bathing  Comments: uses RW vs scooter     Hand Dominance        Extremity/Trunk Assessment   Upper Extremity Assessment Upper Extremity Assessment: Generalized weakness    Lower Extremity Assessment Lower Extremity Assessment: Generalized weakness    Cervical / Trunk Assessment Cervical / Trunk Assessment: Kyphotic  Communication   Communication: HOH  Cognition Arousal/Alertness: Awake/alert Behavior During Therapy: WFL for tasks assessed/performed Overall Cognitive Status: Within Functional Limits for tasks assessed                                        General Comments      Exercises  Assessment/Plan    PT Assessment Patient needs continued PT services  PT Problem List Decreased balance;Decreased mobility;Decreased strength       PT Treatment Interventions DME instruction;Functional mobility training;Therapeutic activities;Balance training;Patient/family education;Therapeutic exercise;Gait training    PT Goals (Current goals can be found in the Care Plan section)  Acute Rehab PT Goals Patient Stated Goal: back to ALF PT Goal Formulation: With patient Time For Goal Achievement: 08/30/17 Potential to Achieve Goals: Good    Frequency Min 3X/week   Barriers to discharge        Co-evaluation               AM-PAC PT "6  Clicks" Daily Activity  Outcome Measure Difficulty turning over in bed (including adjusting bedclothes, sheets and blankets)?: A Lot Difficulty moving from lying on back to sitting on the side of the bed? : Unable Difficulty sitting down on and standing up from a chair with arms (e.g., wheelchair, bedside commode, etc,.)?: Unable Help needed moving to and from a bed to chair (including a wheelchair)?: A Little Help needed walking in hospital room?: A Little Help needed climbing 3-5 steps with a railing? : A Lot 6 Click Score: 12    End of Session Equipment Utilized During Treatment: Gait belt Activity Tolerance: Patient tolerated treatment well Patient left: in bed;with call bell/phone within reach;with bed alarm set   PT Visit Diagnosis: Muscle weakness (generalized) (M62.81);Difficulty in walking, not elsewhere classified (R26.2)    Time: 3383-2919 PT Time Calculation (min) (ACUTE ONLY): 20 min   Charges:   PT Evaluation $PT Eval Moderate Complexity: 1 Mod            Weston Anna, MPT Pager: [phone]

## 2017-08-16 NOTE — Care Management Note (Signed)
Case Management Note  Patient Details  Name: PADRAIG NHAN MRN: [id number] Date of Birth: 1931/04/20  Subjective/Objective:  PT-recc HHPT. [facility] spoke to [facility] for contract H[facility] rep aware of referral. Await HHTP order,& face to face.                   Action/Plan:d/c back to ALF w/HHPT.   Expected Discharge Date:  08/16/17               Expected Discharge Plan:  Assisted Living / Rest Home  In-House Referral:  Clinical Social Work  Discharge planning Services  CM Consult  Post Acute Care Choice:    Choice offered to:     DME Arranged:    DME Agency:     HH Arranged:  PT HH Agency:  [facility] ([facility] [employer]  Status of Service:  In process, will continue to follow  If discussed at Long Length of Stay Meetings, dates discussed:    Additional Comments:  Dessa Phi, RN 08/16/2017, 3:51 PM

## 2017-08-16 NOTE — Progress Notes (Signed)
[facility] PROGRESS NOTE    Progress Note  [name]  OEU:[id number] DOB: 1931-10-30 DOA: 08/13/2017 PCP: System, Pcp Not In     Brief Narrative:   [name] is an 84 y.o. male past medical history of stroke, prostate cancer hypothyroidism nephrolithiasis diabetes mellitus type 2 chronic kidney disease, with a recent STEMI on 07/31/2017 who presents from skilled nursing facility for fevers and left hip pain sharp radiating to the top of her leg was found to have a creatinine of 1.8 (baseline 1.5) hemoglobin of 1111 INR 1.3 CT scan of the left hip showed moderate to severe osteoarthritic changes, CT renal protocol showed right hydronephrosis with nephrolithiasis, urology was consulted recommended transfer to [facility] for cystoscopy and stent placement.  Assessment/Plan:   Right hydronephrosis due to nephrolithiasis and possibly UTI: Urology was consulted and Dr. Alinda Money will perform a cystoscopy with right ureteral stent placement on 08/13/2017. Broad spectrum antibiotics were started IV Unasyn. Culture data has remained negative. Resume his diet. Consult physical therapy.  Paroxysmal A. fib with RVR: Controlled on IV diltiazem drip which now has been changed to oral diltiazem, continue oral metoprolol. No need for anticoagulation and as he spent less than 6 hours in A. fib with RVR. Continue aspirin and Brilinta.  Left hip groin with a history of severe osteoarthritis: Continue Tylenol for pain.  Hypothyroidism: Continue Synthroid.  Diabetes mellitus type 2: Hold Januvia continue sliding scale insulin resume his diet.  Essential hypertension: Resume antihypertensive medication with his blood pressure start rising  Acute Injury on chronic kidney disease stage III: Continue to hold nephrotoxic drugs, Basic metabolic panel is pending, likely prerenal in etiology.  History of non-ST elevation MI: Status post stent placement: He denies any shortness of  breath.  Normocytic anemia: There is been a mild drop in hemoglobin from 12-10 with no hematuria, FOBT is negative.  DVT prophylaxis: asp and brillinta Family Communication:none Disposition Plan/Barrier to D/C: SNF in a.m. Code Status:     Code Status Orders  (From admission, onward)        Start     Ordered   08/13/17 1740  Do not attempt resuscitation (DNR)  Continuous    Question Answer Comment  In the event of cardiac or respiratory ARREST Do not call a "code blue"   In the event of cardiac or respiratory ARREST Do not perform Intubation, CPR, defibrillation or ACLS   In the event of cardiac or respiratory ARREST Use medication by any route, position, wound care, and other measures to relive pain and suffering. May use oxygen, suction and manual treatment of airway obstruction as needed for comfort.      08/13/17 1740    Code Status History    Date Active Date Inactive Code Status Order ID Comments User Context   08/03/2017 2237 08/08/2017 1705 DNR [id number]  Ivor Costa, MD ED   04/22/2016 2202 04/23/2016 1551 Full Code [id number]  Franchot Gallo, MD Inpatient   01/25/2016 1725 01/27/2016 2031 Full Code [id number]  Cleon Gustin, MD ED   10/24/2015 0128 10/25/2015 1715 Full Code [id number]  Norval Morton, MD Inpatient   10/14/2014 1310 10/15/2014 2058 Full Code [id number]  Samella Parr, NP Inpatient    Advance Directive Documentation     Most Recent Value  Type of Advance Directive  Healthcare Power of Attorney, Living will  Pre-existing out of facility DNR order (yellow form or pink MOST form)  -  "MOST" Form in Place?  -  IV Access:    Peripheral IV   Procedures and diagnostic studies:   No results found.   Medical Consultants:    None.  Anti-Infectives:   IV Unasyn.  Subjective:    [name] he relates his chest pain is resolved.  Objective:    Vitals:   08/15/17 1456 08/15/17 2143 08/16/17 0518 08/16/17 0605  BP: 106/66  125/84 131/81   Pulse: (!) 58 69 69   Resp: 12 16    Temp: 97.8 F (36.6 C) 98 F (36.7 C)  97.8 F (36.6 C)  TempSrc: Oral Oral  Oral  SpO2: 99% 100% 97%   Weight:      Height:        Intake/Output Summary (Last 24 hours) at 08/16/2017 0832 Last data filed at 08/16/2017 0600 Gross per 24 hour  Intake 3290.7 ml  Output -  Net 3290.7 ml   Filed Weights   08/13/17 0918  Weight: 64.9 kg (143 lb)    Exam: General exam: In no acute distress. Respiratory system: Good air movement and clear to auscultation. Cardiovascular system: S1 & S2 heard, RRR. No JVD, murmurs, rubs, gallops or clicks.  Gastrointestinal system: Abdomen is nondistended, soft and nontender.  Central nervous system: Alert and oriented. No focal neurological deficits. Extremities: No pedal edema. Skin: No rashes, lesions or ulcers Psychiatry: Judgement and insight appear normal. Mood & affect appropriate.    Data Reviewed:    Labs: Basic Metabolic Panel: Recent Labs  Lab 08/13/17 0959 08/14/17 0502 08/16/17 0436  NA 136 138 139  K 4.2 4.1 3.7  CL 107 109 110  CO2 22 19* 23  GLUCOSE 128* 182* 120*  BUN 25* 25* 25*  CREATININE 1.82* 1.48* 1.22  CALCIUM 8.2* 8.3* 8.3*   GFR Estimated Creatinine Clearance: 42.1 mL/min (by C-G formula based on SCr of 1.22 mg/dL). Liver Function Tests: Recent Labs  Lab 08/13/17 0959  AST 9*  ALT <5  ALKPHOS 60  BILITOT 1.1  PROT 5.5*  ALBUMIN 3.1*   No results for input(s): LIPASE, AMYLASE in the last 168 hours. No results for input(s): AMMONIA in the last 168 hours. Coagulation profile Recent Labs  Lab 08/13/17 0959 08/14/17 0502  INR 1.23 1.13    CBC: Recent Labs  Lab 08/13/17 0959 08/14/17 0502 08/15/17 0702 08/16/17 0436  WBC 9.8 6.9 8.8 6.9  NEUTROABS 8.3*  --  6.6  --   HGB 11.0* 12.1* 10.4* 11.3*  HCT 34.4* 37.5* 31.3* 34.6*  MCV 91.5 89.9 89.4 89.9  PLT 157 203 199 208   Cardiac Enzymes: No results for input(s): CKTOTAL, CKMB,  CKMBINDEX, TROPONINI in the last 168 hours. BNP (last 3 results) No results for input(s): PROBNP in the last 8760 hours. CBG: Recent Labs  Lab 08/13/17 2213 08/14/17 0952  GLUCAP 83 273*   D-Dimer: No results for input(s): DDIMER in the last 72 hours. Hgb A1c: No results for input(s): HGBA1C in the last 72 hours. Lipid Profile: No results for input(s): CHOL, HDL, LDLCALC, TRIG, CHOLHDL, LDLDIRECT in the last 72 hours. Thyroid function studies: Recent Labs    08/14/17 0502  TSH 0.609   Anemia work up: No results for input(s): VITAMINB12, FOLATE, FERRITIN, TIBC, IRON, RETICCTPCT in the last 72 hours. Sepsis Labs: Recent Labs  Lab 08/13/17 0959 08/13/17 1015 08/13/17 1157 08/14/17 0502 08/15/17 0702 08/16/17 0436  WBC 9.8  --   --  6.9 8.8 6.9  LATICACIDVEN  --  0.76 0.66  --   --   --  Microbiology Recent Results (from the past 240 hour(s))  Urine Culture     Status: None   Collection Time: 08/13/17  9:37 AM  Result Value Ref Range Status   Specimen Description URINE, CATHETERIZED  Final   Special Requests NONE  Final   Culture   Final    NO GROWTH Performed at [facility], [address] [place], [place] [address]    Report Status 08/14/2017 FINAL  Final  Culture, blood (Routine x 2)     Status: None (Preliminary result)   Collection Time: 08/13/17 10:05 AM  Result Value Ref Range Status   Specimen Description BLOOD LEFT ANTECUBITAL  Final   Special Requests   Final    BOTTLES DRAWN AEROBIC AND ANAEROBIC Blood Culture results may not be optimal due to an inadequate volume of blood received in culture bottles   Culture   Final    NO GROWTH 2 DAYS Performed at [facility], [place] [address], [place], [place] [address]    Report Status PENDING  Incomplete  Culture, blood (Routine x 2)     Status: None (Preliminary result)   Collection Time: 08/13/17 10:35 AM  Result Value Ref Range Status   Specimen Description BLOOD RIGHT ANTECUBITAL  Final    Special Requests   Final    BOTTLES DRAWN AEROBIC AND ANAEROBIC Blood Culture adequate volume   Culture   Final    NO GROWTH 2 DAYS Performed at [facility], [place] [address], [place], [place] [address]    Report Status PENDING  Incomplete     Medications:   . alfuzosin  10 mg Oral QHS  . aspirin EC  81 mg Oral Daily  . atropine  1 drop Right Eye Daily  . brimonidine  1 drop Both Eyes BID   And  . timolol  1 drop Both Eyes BID  . brinzolamide  1 drop Right Eye TID  . carbidopa-levodopa  1 tablet Oral TID  . cholecalciferol  5,000 Units Oral Daily  . colesevelam  1,875 mg Oral Daily  . diltiazem  60 mg Oral Q6H  . docusate sodium  100 mg Oral Daily  . gabapentin  100 mg Oral QHS  . latanoprost  1 drop Right Eye QHS  . levothyroxine  200 mcg Oral QAC breakfast  . metoprolol succinate  25 mg Oral Daily  . metoprolol tartrate  5 mg Intravenous Once  . mirabegron ER  50 mg Oral Daily  . pantoprazole  80 mg Oral Daily  . polyethylene glycol  17 g Oral BID  . potassium chloride  10 mEq Oral Daily  . ticagrelor  90 mg Oral BID   Continuous Infusions: . sodium chloride 10 mL/hr at 08/16/17 0600  . ampicillin-sulbactam (UNASYN) IV Stopped (08/16/17 0544)     LOS: 3 days   [facility] Pager [phone]  *Please refer to [email or web address], password [email or web address] to get updated schedule on who will round on this patient, as hospitalists switch teams weekly. If 7PM-7AM, please contact night-coverage at [email or web address], password [email or web address] for any overnight needs.  08/16/2017, 8:32 AM

## 2017-08-17 DIAGNOSIS — I10 Essential (primary) hypertension: Secondary | ICD-10-CM

## 2017-08-17 DIAGNOSIS — N390 Urinary tract infection, site not specified: Secondary | ICD-10-CM

## 2017-08-17 DIAGNOSIS — R319 Hematuria, unspecified: Secondary | ICD-10-CM

## 2017-08-17 LAB — BASIC METABOLIC PANEL
Anion gap: 8 (ref 5–15)
BUN: 19 mg/dL (ref 8–23)
CALCIUM: 8.3 mg/dL — AB (ref 8.9–10.3)
CO2: 23 mmol/L (ref 22–32)
CREATININE: 1.07 mg/dL (ref 0.61–1.24)
Chloride: 106 mmol/L (ref 98–111)
GFR calc non Af Amer: >60 mL/min (ref >60)
Glucose, Bld: 107 mg/dL — ABNORMAL HIGH (ref 70–99)
Potassium: 3.8 mmol/L (ref 3.5–5.1)
SODIUM: 137 mmol/L (ref 135–145)

## 2017-08-17 LAB — CBC
HCT: 37.5 % — ABNORMAL LOW (ref 39.0–52.0)
Hemoglobin: 12.5 g/dL — ABNORMAL LOW (ref 13.0–17.0)
MCH: 29.6 pg (ref 26.0–34.0)
MCHC: 33.3 g/dL (ref 30.0–36.0)
MCV: 88.9 fL (ref 78.0–100.0)
PLATELETS: 210 10*3/uL (ref 150–400)
RBC: 4.22 MIL/uL (ref 4.22–5.81)
RDW: 13.9 % (ref 11.5–15.5)
WBC: 7 10*3/uL (ref 4.0–10.5)

## 2017-08-17 MED ORDER — ZOLPIDEM TARTRATE 5 MG PO TABS
5.0000 mg | ORAL_TABLET | Freq: Every evening | ORAL | Status: DC | PRN
  Administered 2017-08-17: 5 mg via ORAL
  Filled 2017-08-17: qty 1

## 2017-08-17 MED ORDER — AMOXICILLIN-POT CLAVULANATE 500-125 MG PO TABS: 1.0000 | ORAL_TABLET | Freq: Three times a day (TID) | ORAL | 0 refills | Status: AC

## 2017-08-17 MED ORDER — AMOXICILLIN-POT CLAVULANATE 500-125 MG PO TABS: 1.0000 | ORAL_TABLET | Freq: Three times a day (TID) | ORAL | Status: DC

## 2017-08-17 NOTE — Discharge Summary (Signed)
Physician Discharge Summary  [name] NTI:[id number] DOB: 1931/08/17 DOA: 08/13/2017  PCP: System, Pcp Not In  Admit date: 08/13/2017 Discharge date: 08/17/2017  Admitted From: home Disposition:  Home  Recommendations for Outpatient Follow-up:  1. Follow up with Urology in 1-2 weeks 2. Please obtain BMP/CBC in one week  Home Health:No Equipment/Devices:None  Discharge Condition:stable CODE STATUS:full Diet recommendation: Heart Healthy   Brief/Interim Summary: 84 y.o. male past medical history of stroke, prostate cancer hypothyroidism nephrolithiasis diabetes mellitus type 2 chronic kidney disease, with a recent STEMI on 07/31/2017 who presents from skilled nursing facility for fevers and left hip pain sharp radiating to the top of her leg was found to have a creatinine of 1.8 (baseline 1.5) hemoglobin of 1111 INR 1.3 CT scan of the left hip showed moderate to severe osteoarthritic changes, CT renal protocol showed right hydronephrosis with nephrolithiasis, urology was consulted recommended transfer to [facility] for cystoscopy and stent placement.    Discharge Diagnoses:  Active Problems:   Hypothyroidism (post surgical)   Diabetes mellitus, insulin dependent (IDDM), controlled (HCC)   Mild HTN   Chronic renal insufficiency   Non-ST elevation (NSTEMI) myocardial infarction [facility])   Hydronephrosis   Sepsis (HCC)   Atrial fibrillation with RVR (HCC)   Coronary artery disease involving native coronary artery of native heart without angina pectoris   Hyperlipidemia LDL goal <70  Right hydronephrosis due to nephrolithiasis and possibly UTI: Urology was consulted to perform a cystoscopy on 08/13/2017 and status post stent placement he will follow-up with urology as an outpatient. On admission he was started on broad-spectrum IV Unasyn his culture data remain negative. He will continue oral Augmentin as an outpatient.  Paroxysmal atrial with RVR: He was on metoprolol, he  developed A. fib with RVR started on IV diltiazem his heart rate was controlled, cardiology was consulted and he converted back to sinus rhythm cardiology recommended to continue aspirin and Brilinta as an outpatient along with metoprolol and diltiazem he remained in sinus rhythm.  Left hip groin: Due to severe Oster arthritis continue Tylenol for pain.  Hypothyroidism: Continue Synthroid.  Diabetes mellitus type 2: No change made to his medication.  Essential hypertension: No change made to his medication.  Acute on chronic kidney disease stage III: Likely due to obstructive uropathy this resolved with cystoscopy and stent placement.  History of non-ST elevation MI: Status post stent he denies any shortness of breath no changes were made to his medication.  Normocytic anemia: His hemoglobin dropped from 12-10 no signs of hematuria FOBT negative x2 he will follow-up with PCP as an outpatient. Discharge Instructions  Discharge Instructions    Diet - low sodium heart healthy   Complete by:  As directed    Increase activity slowly   Complete by:  As directed      Allergies as of 08/17/2017      Reactions   Colchicine Anaphylaxis   Hibiclens [chlorhexidine] Hives   Atorvastatin Other (See Comments)   Reaction:  Joint pain    Ceftin Diarrhea   Celebrex [celecoxib] Swelling   Codeine Hives   Contrast Media [iodinated Diagnostic Agents] Nausea And Vomiting, Rash   Erythromycin Diarrhea   Ezetimibe Other (See Comments)   Reaction:  Joint pain    Oxycodone-acetaminophen Hives   Rosuvastatin Other (See Comments)   Reaction:  Joint pain    Simvastatin Other (See Comments)   Reaction:  Joint pain    Cefpodoxime Rash   Cefuroxime Axetil Rash  Nitroglycerin Other (See Comments)   Reaction:  Lowers pts BP   Tetanus Toxoid Rash   Vantin Other (See Comments)   Reaction:  Unknown       Medication List    TAKE these medications   alfuzosin 10 MG 24 hr tablet Commonly known  as:  UROXATRAL Take 1 tablet (10 mg total) by mouth at bedtime.   amoxicillin-clavulanate 500-125 MG tablet Commonly known as:  AUGMENTIN Take 1 tablet (500 mg total) by mouth 3 (three) times daily for 7 days.   aspirin EC 81 MG tablet Take 81 mg by mouth daily.   atropine 1 % ophthalmic solution Place 1 drop into the right eye daily at 6pm for 14 days.   brimonidine-timolol 0.2-0.5 % ophthalmic solution Commonly known as:  COMBIGAN Place 1 drop into both eyes 2 times daily.   brinzolamide 1 % ophthalmic suspension Commonly known as:  AZOPT Place 1 drop into the right eye 3 times daily.   carbidopa-levodopa 25-100 MG tablet Commonly known as:  SINEMET IR Take 1 tablet by mouth 3 (three) times daily.   colesevelam 625 MG tablet Commonly known as:  WELCHOL Take 1,875 mg by mouth daily.   docusate sodium 100 MG capsule Commonly known as:  COLACE Take 100 mg by mouth daily.   fexofenadine 180 MG tablet Commonly known as:  ALLEGRA Take 180 mg by mouth daily.   gabapentin 100 MG capsule Commonly known as:  NEURONTIN Take 100 mg by mouth at bedtime.   hydroxypropyl methylcellulose / hypromellose 2.5 % ophthalmic solution Commonly known as:  ISOPTO TEARS / GONIOVISC Place 1 drop into both eyes 4 (four) times daily.   ICAPS Tabs Take 1 tablet by mouth daily.   latanoprost 0.005 % ophthalmic solution Commonly known as:  XALATAN Place 1 drop into the right eye daily.   levothyroxine 200 MCG tablet Commonly known as:  SYNTHROID, LEVOTHROID Take 200 mcg by mouth daily.   metoprolol succinate 25 MG 24 hr tablet Commonly known as:  TOPROL-XL Take 1 tablet (25 mg total) by mouth daily.   mirabegron ER 50 MG Tb24 tablet Commonly known as:  MYRBETRIQ Take 1 tablet (50 mg total) by mouth daily.   MUCINEX 600 MG 12 hr tablet Generic drug:  guaiFENesin Take by mouth 2 (two) times daily.   omeprazole 40 MG capsule Commonly known as:  PRILOSEC Take 40 mg by mouth  daily.   polyethylene glycol packet Commonly known as:  MIRALAX / GLYCOLAX Take 17 g by mouth daily.   potassium chloride 10 MEQ tablet Commonly known as:  K-DUR,KLOR-CON Take 10 mEq by mouth daily.   PROAIR HFA 108 (90 Base) MCG/ACT inhaler Generic drug:  albuterol Inhale 2 puffs into the lungs 3 (three) times daily as needed for wheezing or shortness of breath.   sitaGLIPtin 50 MG tablet Commonly known as:  JANUVIA Take 1 tablet (50 mg total) by mouth daily.   ticagrelor 90 MG Tabs tablet Commonly known as:  BRILINTA Take 1 tablet (90 mg total) by mouth 2 (two) times daily.   traMADol 50 MG tablet Commonly known as:  ULTRAM Take 25 mg by mouth 2 (two) times daily.   traMADol 50 MG tablet Commonly known as:  ULTRAM Take 1 tablet (50 mg total) by mouth every 6 (six) hours as needed.   URIBEL 118 MG Caps Take 1 capsule (118 mg total) by mouth 3 (three) times daily as needed (burning). What changed:  reasons to take this  Vitamin D-3 5000 units Tabs Take 5,000 Units by mouth daily.   zolpidem 5 MG tablet Commonly known as:  AMBIEN Take 0.5 tablets (2.5 mg total) by mouth at bedtime as needed for sleep.      Follow-up Information    Burtis Junes, NP Follow up.   Specialties:  Nurse Practitioner, Interventional Cardiology, Cardiology, Radiology Why:  Cardiology hospital follow-up on August 19 at 11:30.  Please arrive 15 minutes early for check-in. Contact information: [place]. [address] [place] [address] [phone]        [facility] Follow up.   Specialty:  Home Health Services Why:  [facility] physical therapy Contact information: [address] [place] [address] [phone]          Allergies  Allergen Reactions  . Colchicine Anaphylaxis  . Hibiclens [Chlorhexidine] Hives  . Atorvastatin Other (See Comments)    Reaction:  Joint pain   . Ceftin Diarrhea  . Celebrex [Celecoxib] Swelling  . Codeine Hives  .  Contrast Media [Iodinated Diagnostic Agents] Nausea And Vomiting and Rash  . Erythromycin Diarrhea  . Ezetimibe Other (See Comments)    Reaction:  Joint pain   . Oxycodone-Acetaminophen Hives  . Rosuvastatin Other (See Comments)    Reaction:  Joint pain   . Simvastatin Other (See Comments)    Reaction:  Joint pain   . Cefpodoxime Rash  . Cefuroxime Axetil Rash  . Nitroglycerin Other (See Comments)    Reaction:  Lowers pts BP  . Tetanus Toxoid Rash  . Vantin Other (See Comments)    Reaction:  Unknown     Consultations:  Urology  Cardiology   Procedures/Studies: Dg Chest 2 View  Result Date: 08/03/2017 CLINICAL DATA:  Chest pain EXAM: CHEST - 2 VIEW COMPARISON:  01/25/2016 FINDINGS: Heart size and vascularity normal. Probable COPD. Negative for infiltrate effusion or mass. Numerous small calcifications in the left lung base are chronic and unchanged. IMPRESSION: No active cardiopulmonary disease. Electronically Signed   By: Franchot Gallo M.D.   On: 08/03/2017 18:33   Ct Hip Left Wo Contrast  Result Date: 08/13/2017 CLINICAL DATA:  Left hip and groin pain. EXAM: CT OF THE LEFT HIP WITHOUT CONTRAST TECHNIQUE: Multidetector CT imaging of the left hip was performed according to the standard protocol. Multiplanar CT image reconstructions were also generated. COMPARISON:  CT scan 01/25/2016 FINDINGS: Moderate to advanced left hip joint degenerative changes but no obvious fracture or AVN. Pubic symphysis and SI joint degenerative changes with chondrocalcinosis but no fracture of the left hemipelvis is identified. The surrounding left hip and pelvic musculature are grossly normal. No hematoma or abnormal fluid collection. No inguinal mass or inguinal adenopathy.  No inguinal hernia. Advanced facet disease is noted in the lower lumbar spine but no pars defects. IMPRESSION: 1. Moderate to advanced left hip joint degenerative changes but no fracture or AVN. 2. Grossly normal CT appearance of the  left hip and pelvic musculature. 3. Lower lumbar facet disease along with left SI joint and pubic symphysis degenerative changes but no definite pelvic fractures or pelvic bone lesion. Electronically Signed   By: Marijo Sanes M.D.   On: 08/13/2017 11:52   Nm Myocar Multi W/spect W/wall Motion / Ef  Result Date: 08/05/2017  There was no ST segment deviation noted during stress.  Findings consistent with ischemia.  The left ventricular ejection fraction is normal (55-65%).  This is an intermediate risk study.  Thinning of the  inferior wall Moderate area of lateral wall ischemia at mid and basal level EF 58%   Dg Chest Port 1 View  Result Date: 08/13/2017 CLINICAL DATA:  Fever, weakness, shaking this morning, LEFT leg numbness, history asthmatic bronchitis, type II diabetes mellitus, hypertension, prostate cancer EXAM: PORTABLE CHEST 1 VIEW COMPARISON:  Portable exam 1013 hours compared to 08/03/2017 FINDINGS: Borderline enlargement of cardiac silhouette. Slight pulmonary vascular congestion. Atherosclerotic calcifications aorta. Bibasilar atelectasis. Chronic reticulonodular interstitial changes at LEFT base unchanged. No acute infiltrate, pleural effusion or pneumothorax. Bones demineralized. IMPRESSION: Bibasilar atelectasis and chronic LEFT basilar reticulonodular interstitial disease. No acute abnormalities. Electronically Signed   By: Lavonia Dana M.D.   On: 08/13/2017 10:25   Dg C-arm 1-60 Min-no Report  Result Date: 08/13/2017 Fluoroscopy was utilized by the requesting physician.  No radiographic interpretation.   Ct Renal Stone Study  Result Date: 08/13/2017 CLINICAL DATA:  Fever and abdominal pain. EXAM: CT ABDOMEN AND PELVIS WITHOUT CONTRAST TECHNIQUE: Multidetector CT imaging of the abdomen and pelvis was performed following the standard protocol without IV contrast. COMPARISON:  CT scan 01/25/2016 FINDINGS: Lower chest: The lung bases are grossly clear. Minimal dependent bibasilar  atelectasis. Numerous small calcifications are noted at both lung bases, left greater than right possibly due to prior aspiration, remote infection or pulmonary alveolar microlithiasis. The heart is borderline enlarged but stable. Stable aortic and coronary artery calcifications. Large hiatal hernia is stable. Hepatobiliary: No focal hepatic lesions or intrahepatic biliary dilatation. The gallbladder is mildly distended and there are several gallstones noted. No definite CT findings for acute cholecystitis. No significant common bile duct dilatation. Pancreas: Moderate atrophy. Stable small cystic areas. No ductal dilatation. Spleen: Normal size.  No focal lesions. Adrenals/Urinary Tract: The adrenal glands are unremarkable and stable. Small calcifications are noted. Numerous bilateral renal cysts some complicated by rim-like calcifications. No worrisome renal lesions. Bilateral renal calculi. Moderate to marked right-sided hydroureteronephrosis with 2 distal right ureteral calculi noted. The more proximal calculus measures 6.5 x 5.0 mm and the more distal calculus near the UVJ measures 6.0 x 5.0 mm. No left-sided ureteral calculi or hydroureteronephrosis. No bladder mass or bladder calculi. Stomach/Bowel: The stomach, duodenum, small bowel and colon are grossly normal without oral contrast. No obvious acute inflammatory process, mass lesion or obstructive findings. Vascular/Lymphatic: Advanced atherosclerotic calcifications involving the aorta and branch vessels without focal aneurysm. No mesenteric or retroperitoneal mass or adenopathy. Small scattered lymph nodes are stable. Reproductive: The prostate gland and seminal vesicles are unremarkable. Other: Small amount of free pelvic fluid noted on the right side. No pelvic mass or pelvic adenopathy. No inguinal mass or adenopathy. Musculoskeletal: No significant bony findings. Advanced degenerative changes involving the spine. No hip or pelvic fractures are  identified. No worrisome bone lesions. Stable mild compression deformity of L1. IMPRESSION: 1. Moderate to marked right-sided hydroureteronephrosis down to 2 distal right ureteral calculi both measuring approximately 6 x 5 mm. 2. Bilateral renal calculi and multiple bilateral renal cysts. 3. Cholelithiasis without definite CT findings for acute cholecystitis. 4. Advanced atherosclerotic calcifications involving the aorta and branch vessels. 5. Large hiatal hernia. 6. No significant/acute bony findings. Electronically Signed   By: Marijo Sanes M.D.   On: 08/13/2017 12:11      Subjective: No complaints feels great.  Discharge Exam: Vitals:   08/17/17 0426 08/17/17 0829  BP: (!) 166/94 (!) 154/93  Pulse: 79 91  Resp: 20 16  Temp: 98.4 F (36.9 C)   SpO2: 98%  Vitals:   08/16/17 1247 08/16/17 2044 08/17/17 0426 08/17/17 0829  BP: (!) 141/80 (!) 152/82 (!) 166/94 (!) 154/93  Pulse: 76 71 79 91  Resp: 20 20 20 16   Temp: (!) 97.5 F (36.4 C) (!) 97.5 F (36.4 C) 98.4 F (36.9 C)   TempSrc: Oral     SpO2: 98% 99% 98%   Weight:      Height:        General: Pt is alert, awake, not in acute distress Cardiovascular: RRR, S1/S2 +, no rubs, no gallops Respiratory: CTA bilaterally, no wheezing, no rhonchi Abdominal: Soft, NT, ND, bowel sounds + Extremities: no edema, no cyanosis    The results of significant diagnostics from this hospitalization (including imaging, microbiology, ancillary and laboratory) are listed below for reference.     Microbiology: Recent Results (from the past 240 hour(s))  Urine Culture     Status: None   Collection Time: 08/13/17  9:37 AM  Result Value Ref Range Status   Specimen Description URINE, CATHETERIZED  Final   Special Requests NONE  Final   Culture   Final    NO GROWTH Performed at [facility], [address], [place], [place] [address]    Report Status 08/14/2017 FINAL  Final  Culture, blood (Routine x 2)     Status: None  (Preliminary result)   Collection Time: 08/13/17 10:05 AM  Result Value Ref Range Status   Specimen Description BLOOD LEFT ANTECUBITAL  Final   Special Requests   Final    BOTTLES DRAWN AEROBIC AND ANAEROBIC Blood Culture results may not be optimal due to an inadequate volume of blood received in culture bottles   Culture   Final    NO GROWTH 3 DAYS Performed at [facility], [place] [address], [place], [place] [address]    Report Status PENDING  Incomplete  Culture, blood (Routine x 2)     Status: None (Preliminary result)   Collection Time: 08/13/17 10:35 AM  Result Value Ref Range Status   Specimen Description BLOOD RIGHT ANTECUBITAL  Final   Special Requests   Final    BOTTLES DRAWN AEROBIC AND ANAEROBIC Blood Culture adequate volume   Culture   Final    NO GROWTH 3 DAYS Performed at [facility], [place] [address], [place], [place] [address]    Report Status PENDING  Incomplete     Labs: BNP (last 3 results) Recent Labs    08/04/17 0320  BNP 939.0*   Basic Metabolic Panel: Recent Labs  Lab 08/13/17 0959 08/14/17 0502 08/16/17 0436 08/17/17 0503  NA 136 138 139 137  K 4.2 4.1 3.7 3.8  CL 107 109 110 106  CO2 22 19* 23 23  GLUCOSE 128* 182* 120* 107*  BUN 25* 25* 25* 19  CREATININE 1.82* 1.48* 1.22 1.07  CALCIUM 8.2* 8.3* 8.3* 8.3*   Liver Function Tests: Recent Labs  Lab 08/13/17 0959  AST 9*  ALT <5  ALKPHOS 60  BILITOT 1.1  PROT 5.5*  ALBUMIN 3.1*   No results for input(s): LIPASE, AMYLASE in the last 168 hours. No results for input(s): AMMONIA in the last 168 hours. CBC: Recent Labs  Lab 08/13/17 0959 08/14/17 0502 08/15/17 0702 08/16/17 0436 08/17/17 0503  WBC 9.8 6.9 8.8 6.9 7.0  NEUTROABS 8.3*  --  6.6  --   --   HGB 11.0* 12.1* 10.4* 11.3* 12.5*  HCT 34.4* 37.5* 31.3* 34.6* 37.5*  MCV 91.5 89.9  89.4 89.9 88.9  PLT 157 203 199 208 210   Cardiac Enzymes: No results for input(s): CKTOTAL, CKMB, CKMBINDEX, TROPONINI in the last  168 hours. BNP: Invalid input(s): POCBNP CBG: Recent Labs  Lab 08/13/17 2213 08/14/17 0952  GLUCAP 83 273*   D-Dimer No results for input(s): DDIMER in the last 72 hours. Hgb A1c No results for input(s): HGBA1C in the last 72 hours. Lipid Profile No results for input(s): CHOL, HDL, LDLCALC, TRIG, CHOLHDL, LDLDIRECT in the last 72 hours. Thyroid function studies No results for input(s): TSH, T4TOTAL, T3FREE, THYROIDAB in the last 72 hours.  Invalid input(s): FREET3 Anemia work up No results for input(s): VITAMINB12, FOLATE, FERRITIN, TIBC, IRON, RETICCTPCT in the last 72 hours. Urinalysis    Component Value Date/Time   COLORURINE GREEN (A) 08/13/2017 0937   APPEARANCEUR HAZY (A) 08/13/2017 0937   LABSPEC 1.020 08/13/2017 0937   PHURINE 7.0 08/13/2017 0937   GLUCOSEU NEGATIVE 08/13/2017 0937   HGBUR TRACE (A) 08/13/2017 0937   BILIRUBINUR NEGATIVE 08/13/2017 0937   KETONESUR NEGATIVE 08/13/2017 0937   PROTEINUR 100 (A) 08/13/2017 0937   UROBILINOGEN 0.2 05/26/2014 2040   NITRITE NEGATIVE 08/13/2017 0937   LEUKOCYTESUR TRACE (A) 08/13/2017 0937   Sepsis Labs Invalid input(s): PROCALCITONIN,  WBC,  LACTICIDVEN Microbiology Recent Results (from the past 240 hour(s))  Urine Culture     Status: None   Collection Time: 08/13/17  9:37 AM  Result Value Ref Range Status   Specimen Description URINE, CATHETERIZED  Final   Special Requests NONE  Final   Culture   Final    NO GROWTH Performed at [facility], [place] [address] [place], [place] [address]    Report Status 08/14/2017 FINAL  Final  Culture, blood (Routine x 2)     Status: None (Preliminary result)   Collection Time: 08/13/17 10:05 AM  Result Value Ref Range Status   Specimen Description BLOOD LEFT ANTECUBITAL  Final   Special Requests   Final    BOTTLES DRAWN AEROBIC AND ANAEROBIC Blood Culture results may not be optimal due to an inadequate volume of blood received in culture bottles   Culture   Final     NO GROWTH 3 DAYS Performed at [facility], [place] [address], [place], [place] [address]    Report Status PENDING  Incomplete  Culture, blood (Routine x 2)     Status: None (Preliminary result)   Collection Time: 08/13/17 10:35 AM  Result Value Ref Range Status   Specimen Description BLOOD RIGHT ANTECUBITAL  Final   Special Requests   Final    BOTTLES DRAWN AEROBIC AND ANAEROBIC Blood Culture adequate volume   Culture   Final    NO GROWTH 3 DAYS Performed at [facility], [place] [address] [place], [place] [address]    Report Status PENDING  Incomplete     Time coordinating discharge: 40 minutes  SIGNED:   Charlynne Cousins, MD  [facility] 08/17/2017, 11:06 AM Pager   If 7PM-7AM, please contact night-coverage [email or web address] Password [email or web address]

## 2017-08-17 NOTE — Progress Notes (Signed)
No change from am assessment. Pt remains a&ox3, ambulatory 1 assist. Discharge instructions given to family. No questions. Report called to [facility] . Questions concerns denied

## 2017-08-17 NOTE — Progress Notes (Signed)
Physical Therapy Treatment Patient Details Name: [name] MRN: [id number] DOB: May 01, 1931 Today's Date: 08/17/2017    History of Present Illness 84 yo male admitted with hydronephrosis, A fib, sepsis. S/P R ureteral stent 08/13/17. Hx of Parkinson's, DM, prostate ca, CKD, CVA, DM, DVT, STEMI 07/2017    PT Comments    Pt continues to participate well.    Follow Up Recommendations  Home health PT(at ALF as long as facility can provide current level of care)     Equipment Recommendations  None recommended by PT    Recommendations for Other Services       Precautions / Restrictions Precautions Precautions: Fall Restrictions Weight Bearing Restrictions: No    Mobility  Bed Mobility               General bed mobility comments: oob with NT  Transfers Overall transfer level: Needs assistance Equipment used: Rolling walker (2 wheeled) Transfers: Sit to/from Stand Sit to Stand: Min guard         General transfer comment: close guard for safety.   Ambulation/Gait Ambulation/Gait assistance: Min guard Gait Distance (Feet): 115 Feet Assistive device: Rolling walker (2 wheeled) Gait Pattern/deviations: Step-through pattern;Decreased step length - right;Decreased step length - left;Decreased stride length;Trunk flexed     General Gait Details: close guard for safety. Pt tends to take very short steps. No LOB with RW use.    Stairs             Wheelchair Mobility    Modified Rankin (Stroke Patients Only)       Balance           Standing balance support: Bilateral upper extremity supported Standing balance-Leahy Scale: Poor                              Cognition Arousal/Alertness: Awake/alert Behavior During Therapy: WFL for tasks assessed/performed Overall Cognitive Status: Within Functional Limits for tasks assessed                                        Exercises      General Comments        Pertinent  Vitals/Pain Pain Assessment: No/denies pain    Home Living                      Prior Function            PT Goals (current goals can now be found in the care plan section) Progress towards PT goals: Progressing toward goals    Frequency    Min 3X/week      PT Plan      Co-evaluation              AM-PAC PT "6 Clicks" Daily Activity  Outcome Measure  Difficulty turning over in bed (including adjusting bedclothes, sheets and blankets)?: A Lot Difficulty moving from lying on back to sitting on the side of the bed? : Unable Difficulty sitting down on and standing up from a chair with arms (e.g., wheelchair, bedside commode, etc,.)?: A Little Help needed moving to and from a bed to chair (including a wheelchair)?: A Little Help needed walking in hospital room?: A Little Help needed climbing 3-5 steps with a railing? : A Little 6 Click Score: 15    End of Session Equipment Utilized  During Treatment: Gait belt Activity Tolerance: Patient tolerated treatment well Patient left: in chair;with call bell/phone within reach;with chair alarm set   PT Visit Diagnosis: Muscle weakness (generalized) (M62.81);Difficulty in walking, not elsewhere classified (R26.2)     Time: 2334-3568 PT Time Calculation (min) (ACUTE ONLY): 12 min  Charges:  $Gait Training: 8-22 mins                        Weston Anna, MPT Pager: [phone]

## 2017-08-17 NOTE — Progress Notes (Signed)
[facility] PROGRESS NOTE    Progress Note  [name]  WER:[id number] DOB: [birth year] DOA: 08/13/2017 PCP: System, Pcp Not In     Brief Narrative:   [name] is an 84 y.o. male past medical history of stroke, prostate cancer hypothyroidism nephrolithiasis diabetes mellitus type 2 chronic kidney disease, with a recent STEMI on 07/31/2017 who presents from skilled nursing facility for fevers and left hip pain sharp radiating to the top of her leg was found to have a creatinine of 1.8 (baseline 1.5) hemoglobin of 1111 INR 1.3 CT scan of the left hip showed moderate to severe osteoarthritic changes, CT renal protocol showed right hydronephrosis with nephrolithiasis, urology was consulted recommended transfer to [facility] for cystoscopy and stent placement.  Assessment/Plan:   Right hydronephrosis due to nephrolithiasis and possibly UTI: Urology was consulted and Dr. Alinda Money s/p cystoscopy with right ureteral stent placement on 08/13/2017. Broad spectrum antibiotics were started IV Unasyn. Culture data has remained negative. Resume his diet. Consult physical therapy.  Paroxysmal A. fib with RVR: On oral metoprolol and diltiazem, now appears to be regular on telemetry we will check a 12-lead EKG.  And will discuss with cardiology. He was not started on Eliquis initially as he was in atrial fibrillation for less than 6 hours. Continue aspirin and Brilinta.  Left hip groin with a history of severe osteoarthritis: Continue Tylenol for pain.  Hypothyroidism: Continue Synthroid.  Diabetes mellitus type 2: Hold Januvia continue sliding scale insulin resume his diet.  Essential hypertension: Resume antihypertensive medication with his blood pressure start rising  Acute Injury on chronic kidney disease stage III: Continue to hold nephrotoxic drugs, Basic metabolic panel is pending, likely prerenal in etiology.  History of non-ST elevation MI: Status post stent  placement: He denies any shortness of breath.  Normocytic anemia: There is been a mild drop in hemoglobin from 12-10 with no hematuria, FOBT is negative.  DVT prophylaxis: asp and brillinta Family Communication:none Disposition Plan/Barrier to D/C: Unable to determine. Code Status:     Code Status Orders  (From admission, onward)        Start     Ordered   08/13/17 1740  Do not attempt resuscitation (DNR)  Continuous    Question Answer Comment  In the event of cardiac or respiratory ARREST Do not call a "code blue"   In the event of cardiac or respiratory ARREST Do not perform Intubation, CPR, defibrillation or ACLS   In the event of cardiac or respiratory ARREST Use medication by any route, position, wound care, and other measures to relive pain and suffering. May use oxygen, suction and manual treatment of airway obstruction as needed for comfort.      08/13/17 1740    Code Status History    Date Active Date Inactive Code Status Order ID Comments User Context   08/03/2017 2237 08/08/2017 1705 DNR [id number]  Ivor Costa, MD ED   04/22/2016 2202 04/23/2016 1551 Full Code [id number]  Franchot Gallo, MD Inpatient   01/25/2016 1725 01/27/2016 2031 Full Code [id number]  Cleon Gustin, MD ED   10/24/2015 0128 10/25/2015 1715 Full Code [id number]  Norval Morton, MD Inpatient   10/14/2014 1310 10/15/2014 2058 Full Code [id number]  Samella Parr, NP Inpatient    Advance Directive Documentation     Most Recent Value  Type of Advance Directive  Healthcare Power of Attorney, Living will  Pre-existing out of facility DNR order (yellow form or pink MOST form)  -  "  MOST" Form in Place?  -        IV Access:    Peripheral IV   Procedures and diagnostic studies:   No results found.   Medical Consultants:    None.  Anti-Infectives:   IV Unasyn.  Subjective:    [name] he relates his chest pain is resolved.  Objective:    Vitals:   08/16/17 0605  08/16/17 1247 08/16/17 2044 08/17/17 0426  BP:  (!) 141/80 (!) 152/82 (!) 166/94  Pulse:  76 71 79  Resp:  20 20 20   Temp: 97.8 F (36.6 C) (!) 97.5 F (36.4 C) (!) 97.5 F (36.4 C) 98.4 F (36.9 C)  TempSrc: Oral Oral    SpO2:  98% 99% 98%  Weight:      Height:        Intake/Output Summary (Last 24 hours) at 08/17/2017 0728 Last data filed at 08/17/2017 0600 Gross per 24 hour  Intake 298.94 ml  Output 5250 ml  Net -4951.06 ml   Filed Weights   08/13/17 0918  Weight: 64.9 kg (143 lb)    Exam: General exam: In no acute distress. Respiratory system: Good air movement and clear to auscultation. Cardiovascular system: S1 & S2 heard, RRR. No JVD, murmurs, rubs, gallops or clicks.  Gastrointestinal system: Abdomen is nondistended, soft and nontender.  Central nervous system: Alert and oriented. No focal neurological deficits. Extremities: No pedal edema. Skin: No rashes, lesions or ulcers Psychiatry: Judgement and insight appear normal. Mood & affect appropriate.    Data Reviewed:    Labs: Basic Metabolic Panel: Recent Labs  Lab 08/13/17 0959 08/14/17 0502 08/16/17 0436 08/17/17 0503  NA 136 138 139 137  K 4.2 4.1 3.7 3.8  CL 107 109 110 106  CO2 22 19* 23 23  GLUCOSE 128* 182* 120* 107*  BUN 25* 25* 25* 19  CREATININE 1.82* 1.48* 1.22 1.07  CALCIUM 8.2* 8.3* 8.3* 8.3*   GFR Estimated Creatinine Clearance: 48 mL/min (by C-G formula based on SCr of 1.07 mg/dL). Liver Function Tests: Recent Labs  Lab 08/13/17 0959  AST 9*  ALT <5  ALKPHOS 60  BILITOT 1.1  PROT 5.5*  ALBUMIN 3.1*   No results for input(s): LIPASE, AMYLASE in the last 168 hours. No results for input(s): AMMONIA in the last 168 hours. Coagulation profile Recent Labs  Lab 08/13/17 0959 08/14/17 0502  INR 1.23 1.13    CBC: Recent Labs  Lab 08/13/17 0959 08/14/17 0502 08/15/17 0702 08/16/17 0436 08/17/17 0503  WBC 9.8 6.9 8.8 6.9 7.0  NEUTROABS 8.3*  --  6.6  --   --   HGB  11.0* 12.1* 10.4* 11.3* 12.5*  HCT 34.4* 37.5* 31.3* 34.6* 37.5*  MCV 91.5 89.9 89.4 89.9 88.9  PLT 157 203 199 208 210   Cardiac Enzymes: No results for input(s): CKTOTAL, CKMB, CKMBINDEX, TROPONINI in the last 168 hours. BNP (last 3 results) No results for input(s): PROBNP in the last 8760 hours. CBG: Recent Labs  Lab 08/13/17 2213 08/14/17 0952  GLUCAP 83 273*   D-Dimer: No results for input(s): DDIMER in the last 72 hours. Hgb A1c: No results for input(s): HGBA1C in the last 72 hours. Lipid Profile: No results for input(s): CHOL, HDL, LDLCALC, TRIG, CHOLHDL, LDLDIRECT in the last 72 hours. Thyroid function studies: No results for input(s): TSH, T4TOTAL, T3FREE, THYROIDAB in the last 72 hours.  Invalid input(s): FREET3 Anemia work up: No results for input(s): VITAMINB12, FOLATE, FERRITIN, TIBC, IRON,  RETICCTPCT in the last 72 hours. Sepsis Labs: Recent Labs  Lab 08/13/17 1015 08/13/17 1157 08/14/17 0502 08/15/17 0702 08/16/17 0436 08/17/17 0503  WBC  --   --  6.9 8.8 6.9 7.0  LATICACIDVEN 0.76 0.66  --   --   --   --    Microbiology Recent Results (from the past 240 hour(s))  Urine Culture     Status: None   Collection Time: 08/13/17  9:37 AM  Result Value Ref Range Status   Specimen Description URINE, CATHETERIZED  Final   Special Requests NONE  Final   Culture   Final    NO GROWTH Performed at [facility], [address], [place], [place] [address]    Report Status 08/14/2017 FINAL  Final  Culture, blood (Routine x 2)     Status: None (Preliminary result)   Collection Time: 08/13/17 10:05 AM  Result Value Ref Range Status   Specimen Description BLOOD LEFT ANTECUBITAL  Final   Special Requests   Final    BOTTLES DRAWN AEROBIC AND ANAEROBIC Blood Culture results may not be optimal due to an inadequate volume of blood received in culture bottles   Culture   Final    NO GROWTH 3 DAYS Performed at [facility], [place] [address] [place],  [place] [address]    Report Status PENDING  Incomplete  Culture, blood (Routine x 2)     Status: None (Preliminary result)   Collection Time: 08/13/17 10:35 AM  Result Value Ref Range Status   Specimen Description BLOOD RIGHT ANTECUBITAL  Final   Special Requests   Final    BOTTLES DRAWN AEROBIC AND ANAEROBIC Blood Culture adequate volume   Culture   Final    NO GROWTH 3 DAYS Performed at [facility], [place] [address] [place], [place] [address]    Report Status PENDING  Incomplete     Medications:   . alfuzosin  10 mg Oral QHS  . aspirin EC  81 mg Oral Daily  . atropine  1 drop Right Eye Daily  . brimonidine  1 drop Both Eyes BID   And  . timolol  1 drop Both Eyes BID  . brinzolamide  1 drop Right Eye TID  . carbidopa-levodopa  1 tablet Oral TID  . cholecalciferol  5,000 Units Oral Daily  . colesevelam  1,875 mg Oral Daily  . diltiazem  240 mg Oral Daily  . docusate sodium  100 mg Oral Daily  . gabapentin  100 mg Oral QHS  . latanoprost  1 drop Right Eye QHS  . levothyroxine  200 mcg Oral QAC breakfast  . metoprolol succinate  25 mg Oral Daily  . metoprolol tartrate  5 mg Intravenous Once  . mirabegron ER  50 mg Oral Daily  . pantoprazole  80 mg Oral Daily  . polyethylene glycol  17 g Oral BID  . potassium chloride  10 mEq Oral Daily  . sulfamethoxazole-trimethoprim  1 tablet Oral Q12H  . ticagrelor  90 mg Oral BID   Continuous Infusions: . sodium chloride 10 mL/hr at 08/17/17 0600     LOS: 4 days   Charlynne Cousins  [facility] Pager [phone]  *Please refer to [email or web address], password [email or web address] to get updated schedule on who will round on this patient, as hospitalists switch teams weekly. If 7PM-7AM, please contact night-coverage at [email or web address], password [email or web address] for any overnight needs.  08/17/2017, 7:28 AM

## 2017-08-17 NOTE — Progress Notes (Signed)
CSW following to assist with discharge planning.  Patient returning to [facility]. CSW sent requested clinical documents. Staff member Natale Milch confirmed patient's ability to return. Patient's sister to transport patient back to ALF. Patient's RN can call report to [phone], packet complete. CSW signing off, no other needs identified at this time.  Abundio Miu, [place] Social Worker [facility] Cell#: [phone]

## 2017-08-17 NOTE — NC FL2 (Signed)
[place] LEVEL OF CARE SCREENING TOOL     IDENTIFICATION  Patient Name: [name] Birthdate: [birth year] Sex: male Admission Date (Current Location): 08/13/2017  [facility] and [place] Number:  Herbalist and Address:  [facility],  [place], [place]      Provider Number: [id number]  Attending Physician Name and Address:  Charlynne Cousins, MD  Relative Name and Phone Number:  Kevan Rosebush [phone]    Current Level of Care: Other (Comment)(ALF) Recommended Level of Care: Assisted Living Facility Prior Approval Number:    Date Approved/Denied:   PASRR Number:    Discharge Plan: Other (Comment)(ALF)    Current Diagnoses: Patient Active Problem List   Diagnosis Date Noted  . Coronary artery disease involving native coronary artery of native heart without angina pectoris   . Hyperlipidemia LDL goal <70   . Sepsis ([place]) 08/14/2017  . Atrial fibrillation with RVR ([place]) 08/14/2017  . Hydronephrosis 08/13/2017  . Hydroureteronephrosis   . Right ureteral stone   . Urinary tract infection with hematuria   . Non-ST elevation (NSTEMI) myocardial infarction ([place])   . GERD (gastroesophageal reflux disease) 08/04/2017  . Elevated troponin 08/03/2017  . Chest pain 08/03/2017  . CKD (chronic kidney disease), stage III ([place]) 08/03/2017  . Type II diabetes mellitus with renal manifestations ([place]) 08/03/2017  . Pseudophakia of both eyes 05/31/2017  . Retained ureteral stent 04/22/2016  . Ureteral calculus 01/25/2016  . Mental status change 10/24/2015  . Encephalopathy 10/24/2015  . Hypothermia 10/24/2015  . Chronic renal insufficiency 10/24/2015  . Benign essential HTN 10/24/2015  . Hypoglycemia 10/24/2015  . Acute encephalopathy 10/24/2015  . CVA (cerebral vascular accident) ([place]) 10/14/2014  . Prerenal azotemia 10/14/2014  . Mild HTN 10/14/2014  . CVA (cerebral infarction)   . TIA (transient ischemic attack)    . Diabetes mellitus type 2 without retinopathy ([place]) 03/27/2014  . CME (cystoid macular edema), left 08/10/2013  . PNAR (perennial non-allergic rhinitis) 05/23/2013  . Squamous cell carcinoma of eyelid 05/15/2013  . Squamous cell carcinoma 04/06/2013  . Blepharitis 03/16/2013  . Eyelid lesion 03/16/2013  . Seasonal and perennial allergic rhinitis 06/11/2012  . Posterior vitreous detachment of right eye 10/23/2011  . Exudative age-related macular degeneration ([place]) 07/28/2011  . Nonproliferative diabetic retinopathy ([place]) 04/07/2011  . Acute bronchitis due to infection 01/14/2011  . Nondiabetic proliferative retinopathy 10/17/2010  . Insomnia, unspecified 05/19/2010  . ATAXIA 03/17/2010  . DIZZINESS 03/06/2010  . Chronic constipation 02/21/2008  . OSTEOARTHRITIS, KNEE, SEVERE 01/12/2008  . HYPERLIPIDEMIA 01/17/2007  . Hypothyroidism (post surgical) 10/15/2006  . SKIN CANCER, HX OF 06/08/2006  . Diabetes mellitus, insulin dependent (IDDM), controlled ([place]) 04/22/2006  . Paroxysmal atrial fibrillation ([place])  CHADVASc = 5 04/22/2006  . URTICARIA 04/22/2006  . POPLITEAL CYST 04/22/2006  . THYROIDECTOMY, SUBTOTAL, HX OF 04/22/2006  . TOTAL KNEE REPLACEMENT, HX OF 04/22/2006    Orientation RESPIRATION BLADDER Height & Weight     Self, Time, Situation, Place  Normal Continent Weight: 143 lb (64.9 kg) Height:  5\' 11"  (180.3 cm)  BEHAVIORAL SYMPTOMS/MOOD NEUROLOGICAL BOWEL NUTRITION STATUS      Continent Diet Regular  AMBULATORY STATUS COMMUNICATION OF NEEDS Skin   [place] Verbally Normal                       Personal Care Assistance Level of Assistance  Bathing, Dressing, Feeding,  Bathing Assistance: Maximum assistance Feed Assistance: Independent Dressing  Assistance: Maximum assistance    Functional Limitations Info  (Patient slightly hearing impared, CSW spoke to patient on his RIGHT side.) Sight Info: Adequate Hearing Info: Impaired Speech Info: Adequate     SPECIAL CARE FACTORS FREQUENCY  HHPT , HHOT                    Contractures Contractures Info: Not present    Additional Factors Info  Code Status, Allergies Code Status Info: DNR Allergies Info: Colchicine, Hibiclens Chlorhexidine, Atorvastatin, Ceftin, Celebrex Celecoxib, Codeine, Contrast Media Iodinated Diagnostic Agents, Erythromycin, Ezetimibe, Oxycodone-acetaminophen, Rosuvastatin, Simvastatin, Cefpodoxime, Cefuroxime Axetil, Nitroglycerin, Tetanus Toxoid, Vantin              Discharge Medications: Allergies as of 08/17/2017      Reactions   Colchicine Anaphylaxis   Hibiclens [chlorhexidine] Hives   Atorvastatin Other (See Comments)   Reaction:  Joint pain    Ceftin Diarrhea   Celebrex [celecoxib] Swelling   Codeine Hives   Contrast Media [iodinated Diagnostic Agents] Nausea And Vomiting, Rash   Erythromycin Diarrhea   Ezetimibe Other (See Comments)   Reaction:  Joint pain    Oxycodone-acetaminophen Hives   Rosuvastatin Other (See Comments)   Reaction:  Joint pain    Simvastatin Other (See Comments)   Reaction:  Joint pain    Cefpodoxime Rash   Cefuroxime Axetil Rash   Nitroglycerin Other (See Comments)   Reaction:  Lowers pts BP   Tetanus Toxoid Rash   Vantin Other (See Comments)   Reaction:  Unknown          Medication List    TAKE these medications   alfuzosin 10 MG 24 hr tablet Commonly known as:  UROXATRAL Take 1 tablet (10 mg total) by mouth at bedtime.   amoxicillin-clavulanate 500-125 MG tablet Commonly known as:  AUGMENTIN Take 1 tablet (500 mg total) by mouth 3 (three) times daily for 7 days.   aspirin EC 81 MG tablet Take 81 mg by mouth daily.   atropine 1 % ophthalmic solution Place 1 drop into the right eye daily at 6pm for 14 days.   brimonidine-timolol 0.2-0.5 % ophthalmic solution Commonly known as:  COMBIGAN Place 1 drop into both eyes 2 times daily.   brinzolamide 1 % ophthalmic  suspension Commonly known as:  AZOPT Place 1 drop into the right eye 3 times daily.   carbidopa-levodopa 25-100 MG tablet Commonly known as:  SINEMET IR Take 1 tablet by mouth 3 (three) times daily.   colesevelam 625 MG tablet Commonly known as:  WELCHOL Take 1,875 mg by mouth daily.   docusate sodium 100 MG capsule Commonly known as:  COLACE Take 100 mg by mouth daily.   fexofenadine 180 MG tablet Commonly known as:  ALLEGRA Take 180 mg by mouth daily.   gabapentin 100 MG capsule Commonly known as:  NEURONTIN Take 100 mg by mouth at bedtime.   hydroxypropyl methylcellulose / hypromellose 2.5 % ophthalmic solution Commonly known as:  ISOPTO TEARS / GONIOVISC Place 1 drop into both eyes 4 (four) times daily.   ICAPS Tabs Take 1 tablet by mouth daily.   latanoprost 0.005 % ophthalmic solution Commonly known as:  XALATAN Place 1 drop into the right eye daily.   levothyroxine 200 MCG tablet Commonly known as:  SYNTHROID, LEVOTHROID Take 200 mcg by mouth daily.   metoprolol succinate 25 MG 24 hr tablet Commonly known as:  TOPROL-XL Take 1 tablet (25 mg total) by mouth daily.   mirabegron  ER 50 MG Tb24 tablet Commonly known as:  MYRBETRIQ Take 1 tablet (50 mg total) by mouth daily.   MUCINEX 600 MG 12 hr tablet Generic drug:  guaiFENesin Take by mouth 2 (two) times daily.   omeprazole 40 MG capsule Commonly known as:  PRILOSEC Take 40 mg by mouth daily.   polyethylene glycol packet Commonly known as:  MIRALAX / GLYCOLAX Take 17 g by mouth daily.   potassium chloride 10 MEQ tablet Commonly known as:  K-DUR,KLOR-CON Take 10 mEq by mouth daily.   PROAIR HFA 108 (90 Base) MCG/ACT inhaler Generic drug:  albuterol Inhale 2 puffs into the lungs 3 (three) times daily as needed for wheezing or shortness of breath.   sitaGLIPtin 50 MG tablet Commonly known as:  JANUVIA Take 1 tablet (50 mg total) by mouth daily.   ticagrelor 90 MG Tabs  tablet Commonly known as:  BRILINTA Take 1 tablet (90 mg total) by mouth 2 (two) times daily.   traMADol 50 MG tablet Commonly known as:  ULTRAM Take 25 mg by mouth 2 (two) times daily.   traMADol 50 MG tablet Commonly known as:  ULTRAM Take 1 tablet (50 mg total) by mouth every 6 (six) hours as needed.   URIBEL 118 MG Caps Take 1 capsule (118 mg total) by mouth 3 (three) times daily as needed (burning). What changed:  reasons to take this   Vitamin D-3 5000 units Tabs Take 5,000 Units by mouth daily.   zolpidem 5 MG tablet Commonly known as:  AMBIEN Take 0.5 tablets (2.5 mg total) by mouth at bedtime as needed for sleep.       Relevant Imaging Results:  Relevant Lab Results:   Additional Information SSN: [id number]  Burnis Medin, LCSW

## 2017-08-18 LAB — CULTURE, BLOOD (ROUTINE X 2)
CULTURE: NO GROWTH
Culture: NO GROWTH
Special Requests: ADEQUATE

## 2017-08-24 DIAGNOSIS — G3184 Mild cognitive impairment, so stated: Secondary | ICD-10-CM | POA: Diagnosis not present

## 2017-08-24 DIAGNOSIS — G894 Chronic pain syndrome: Secondary | ICD-10-CM | POA: Diagnosis not present

## 2017-08-24 DIAGNOSIS — N201 Calculus of ureter: Secondary | ICD-10-CM | POA: Diagnosis not present

## 2017-08-24 DIAGNOSIS — N390 Urinary tract infection, site not specified: Secondary | ICD-10-CM | POA: Diagnosis not present

## 2017-08-24 DIAGNOSIS — F5101 Primary insomnia: Secondary | ICD-10-CM | POA: Diagnosis not present

## 2017-08-30 ENCOUNTER — Ambulatory Visit: Payer: Medicare Other | Admitting: Nurse Practitioner

## 2017-08-30 NOTE — Progress Notes (Deleted)
CARDIOLOGY OFFICE NOTE  Date:  08/30/2017    [name] Date of Birth: 05-Jul-1931 Medical Record #[id number]  PCP:  Lajean Manes, MD  Cardiologist:  Servando Snare & ***    No chief complaint on file.   History of Present Illness: [name] is a 84 y.o. male who presents today for a ***  medical history of stroke, prostate cancer hypothyroidism nephrolithiasis diabetes mellitus type 2 chronic kidney disease, with a recent STEMI on 07/31/2017 who presents from skilled nursing facility for fevers and left hip pain sharp radiating to the top of her leg was found to have a creatinine of 1.8 (baseline 1.5) hemoglobin of 1111 INR 1.3 CT scan of the left hip showed moderate to severe osteoarthritic changes, CT renal protocol showed right hydronephrosis with nephrolithiasis, urology was consulted recommended transfer to [facility] long hospital for cystoscopy and stent placement.  Right hydronephrosis due to nephrolithiasis and possibly UTI: Urology was consulted to perform a cystoscopy on 08/13/2017 and status post stent placement he will follow-up with urology as an outpatient. On admission he was started on broad-spectrum IV Unasyn his culture data remain negative. He will continue oral Augmentin as an outpatient.  Paroxysmal atrial with RVR: He was on metoprolol, he developed A. fib with RVR started on IV diltiazem his heart rate was controlled, cardiology was consulted and he converted back to sinus rhythm cardiology recommended to continue aspirin and Brilinta as an outpatient along with metoprolol and diltiazem he remained in sinus rhythm.  Left hip groin: Due to severe Oster arthritis continue Tylenol for pain.  Hypothyroidism: Continue Synthroid.  Diabetes mellitus type 2: No change made to his medication.  Essential hypertension: No change made to his medication.  Acute on chronic kidney disease stage III: Likely due to obstructive uropathy this resolved with  cystoscopy and stent placement.  History of non-ST elevation MI: Status post stent he denies any shortness of breath no changes were made to his medication.  Normocytic anemia: His hemoglobin dropped from 12-10 no signs of hematuria FOBT negative x2 he will follow-up with PCP as an outpatient.  Comes in today. Here with   Past Medical History:  Diagnosis Date  . Allergic rhinitis   . Arthritis    arthritis-kyphosis  . Asthmatic bronchitis   . Chronic kidney disease   . DM type 2 (diabetes mellitus, type 2) ([place])   . DVT, lower extremity, proximal, acute ([place])   . Dyslipidemia   . Dysrhythmia    '97 tachycardia- no recent issues  . GERD (gastroesophageal reflux disease)   . Hearing impaired    bilateral hearing aids  . History of kidney stones   . History of skin cancer   . Hypertension   . Hypothyroidism   . Macular degeneration    injections done bilaterally recent - 08-22-13  . Prostate cancer [facility])    Prostate cancer-radiation only,skin cancer-basal cell and last squamous cell right eye  . Stroke ([place])   . Urgency-frequency syndrome   . Urticaria     Past Surgical History:  Procedure Laterality Date  . BACK SURGERY     lumbar fracture  . cataract surgery Bilateral   . COLONOSCOPY W/ POLYPECTOMY     x2   . CORONARY STENT INTERVENTION N/A 08/06/2017   Procedure: CORONARY STENT INTERVENTION;  Surgeon: Jettie Booze, MD;  Location: [place] [facility];  Service: Cardiovascular;  Laterality: N/A;  . CYSTOSCOPY W/ URETERAL STENT PLACEMENT Right 08/13/2017  Procedure: CYSTOSCOPY WITH RIGHT  RETROGRADE PYELOGRAM/URETERAL STENT PLACEMENT;  Surgeon: Raynelle Bring, MD;  Location: [facility];  Service: Urology;  Laterality: Right;  . CYSTOSCOPY WITH INJECTION N/A 09/11/2013   Procedure: CYSTOSCOPY WITH BOTOX INJECTION INTO THE BLADDER;  Surgeon: Ailene Rud, MD;  Location: [facility];  Service: Urology;  Laterality: N/A;  . CYSTOSCOPY WITH RETROGRADE PYELOGRAM,  URETEROSCOPY AND STENT PLACEMENT    . CYSTOSCOPY WITH STENT PLACEMENT Right 01/26/2016   Procedure: CYSTOSCOPY, RIGHT RETROGRADE WITH RIGHT URETERAL STENT PLACEMENT;  Surgeon: Cleon Gustin, MD;  Location: [facility];  Service: Urology;  Laterality: Right;  . CYSTOSCOPY/RETROGRADE/URETEROSCOPY Right 04/22/2016   Procedure: CYSTOSCOPY/RETROGRADE/ REMOVAL JJ STENT right insertion stent right insertion of foley;  Surgeon: Franchot Gallo, MD;  Location: [facility];  Service: Urology;  Laterality: Right;  . EXTRACORPOREAL SHOCK WAVE LITHOTRIPSY Right 02/27/2016   Procedure: RIGHT EXTRACORPOREAL SHOCK WAVE LITHOTRIPSY (ESWL) GAITED;  Surgeon: Cleon Gustin, MD;  Location: [facility];  Service: Urology;  Laterality: Right;  . EXTRACORPOREAL SHOCK WAVE LITHOTRIPSY Right 02/10/2016   Procedure: EXTRACORPOREAL SHOCK WAVE LITHOTRIPSY (ESWL);  Surgeon: Kathie Rhodes, MD;  Location: [facility];  Service: Urology;  Laterality: Right;  [phone] [id number]- [id number]   . EYE SURGERY     macular degeneration  . GALLBLADDER SURGERY  2006   no cholecystectomy  . HERNIA REPAIR    . LEFT HEART CATH AND CORONARY ANGIOGRAPHY N/A 08/06/2017   Procedure: LEFT HEART CATH AND CORONARY ANGIOGRAPHY;  Surgeon: Jettie Booze, MD;  Location: [place] [facility];  Service: Cardiovascular;  Laterality: N/A;  . LUNG REMOVAL, PARTIAL  age 51   for bronchiectasis  . PARTIAL THYMECTOMY  1985   for nodules  . POPLITEAL SYNOVIAL CYST EXCISION  2005  . TOTAL KNEE ARTHROPLASTY  2008   right  . TOTAL KNEE ARTHROPLASTY  2010   left     Medications: No outpatient medications have been marked as taking for the 08/30/17 encounter (Appointment) with Burtis Junes, NP.     Allergies: Allergies  Allergen Reactions  . Colchicine Anaphylaxis  . Hibiclens [Chlorhexidine] Hives  . Atorvastatin Other (See Comments)    Reaction:  Joint pain   . Ceftin Diarrhea  . Celebrex [Celecoxib]  Swelling  . Codeine Hives  . Contrast Media [Iodinated Diagnostic Agents] Nausea And Vomiting and Rash  . Erythromycin Diarrhea  . Ezetimibe Other (See Comments)    Reaction:  Joint pain   . Oxycodone-Acetaminophen Hives  . Rosuvastatin Other (See Comments)    Reaction:  Joint pain   . Simvastatin Other (See Comments)    Reaction:  Joint pain   . Cefpodoxime Rash  . Cefuroxime Axetil Rash  . Nitroglycerin Other (See Comments)    Reaction:  Lowers pts BP  . Tetanus Toxoid Rash  . Vantin Other (See Comments)    Reaction:  Unknown     Social History: The patient  reports that he has never smoked. He has never used smokeless tobacco. He reports that he does not drink alcohol or use drugs.   Family History: The patient's ***family history includes Heart attack in his father; Heart disease in his father; Hypertension in his mother; Other in his sister; Stroke (age of onset: 68) in his mother.   Review of Systems: Please see the history of present illness.   Otherwise, the review of systems is positive for {NONE DEFAULTED:18576::"none"}.   All other systems are reviewed and negative.   Physical Exam:  VS:  There were no vitals taken for this visit. Marland Kitchen  BMI There is no height or weight on file to calculate BMI.  Wt Readings from Last 3 Encounters:  08/13/17 143 lb (64.9 kg)  08/05/17 143 lb 12.8 oz (65.2 kg)  06/06/16 150 lb (68 kg)    General: Pleasant. Well developed, well nourished and in no acute distress.   HEENT: Normal.  Neck: Supple, no JVD, carotid bruits, or masses noted.  Cardiac: ***Regular rate and rhythm. No murmurs, rubs, or gallops. No edema.  Respiratory:  Lungs are clear to auscultation bilaterally with normal work of breathing.  GI: Soft and nontender.  MS: No deformity or atrophy. Gait and ROM intact.  Skin: Warm and dry. Color is normal.  Neuro:  Strength and sensation are intact and no gross focal deficits noted.  Psych: Alert, appropriate and with normal  affect.   LABORATORY DATA:  EKG:  EKG {ACTION; IS/IS RJJ:[id number]} ordered today. This demonstrates ***.  Lab Results  Component Value Date   WBC 7.0 08/17/2017   HGB 12.5 (L) 08/17/2017   HCT 37.5 (L) 08/17/2017   PLT 210 08/17/2017   GLUCOSE 107 (H) 08/17/2017   CHOL 157 08/04/2017   TRIG 57 08/04/2017   HDL 40 (L) 08/04/2017   LDLDIRECT 153.9 04/02/2011   LDLCALC 106 (H) 08/04/2017   ALT <5 08/13/2017   AST 9 (L) 08/13/2017   NA 137 08/17/2017   K 3.8 08/17/2017   CL 106 08/17/2017   CREATININE 1.07 08/17/2017   BUN 19 08/17/2017   CO2 23 08/17/2017   TSH 0.609 08/14/2017   INR 1.13 08/14/2017   HGBA1C 6.3 (H) 08/04/2017   MICROALBUR 3.8 (H) 02/14/2010   MICROALBUR 3.8 (H) 02/14/2010     BNP (last 3 results) Recent Labs    08/04/17 0320  BNP 431.0*    ProBNP (last 3 results) No results for input(s): PROBNP in the last 8760 hours.   Other Studies Reviewed Today: 2D echo 08/04/2017 Study Conclusions  - Left ventricle: The cavity size was normal. There was mild focal basal hypertrophy of the septum. Systolic function was normal. The estimated ejection fraction was in the range of 55% to 60%. Wall motion was normal; there were no regional wall motion abnormalities. Features are consistent with a pseudonormal left ventricular filling pattern, with concomitant abnormal relaxation and increased filling pressure (grade 2 diastolic dysfunction). - Mitral valve: Calcified annulus. Mildly thickened, mildly calcified leaflets . Mild diffuse thickening of the anterior leaflet, consistent with rheumatic disease. - Left atrium: The atrium was moderately dilated. - Atrial septum: No defect or patent foramen ovale was identified.  Cardiac cath 08/06/2017  Mid RCA to Dist RCA lesion is 25% stenosed.  Ost 1st Mrg to 1st Mrg lesion is 70% stenosed.  Mid Cx lesion is 80% stenosed.  A drug-eluting stent was successfully placed using a STENT SYNERGY  DES 2.25X20.  Post intervention, there is a 0% residual stenosis.  Mid LAD lesion is 25% stenosed.  LV end diastolic pressure is normal.  There is no aortic valve stenosis.   Recommend uninterrupted dual antiplatelet therapy with Aspirin 81mg  daily and Ticagrelor 90mg  twice dailyfor a minimum of 12 months (ACS - Class I recommendation).  Assessment/Plan:  1.  Paroxysmal atrial fibrillation -This was a self-limited episode and patient spontaneously converted to sinus rhythm.   -Continue p.o. Cardizem but change to Cardizem CD 240 mg daily -Agree with holding on anticoagulation unless he has another recurrence of PAF  due to his need to be on dual antiplatelet therapy for his recent stent and his advanced age  32.  ASCAD -Status post recent STEMI with cath showing 25% mid to distal RCA, 70% OM1, 80% mid left circumflex status post PCI with DES to the left circumflex. -He denies any anginal symptoms -Continue aspirin 81 mg daily, Toprol-XL 25 mg daily Brilinta 90 mg twice daily -He is statin intolerant  3.  Hyperlipidemia -He is statin intolerant -Consider referral to lipid clinic as an outpatient for possible PCSK9 inhibitor  Current medicines are reviewed with the patient today.  The patient does not have concerns regarding medicines other than what has been noted above.  The following changes have been made:  See above.  Labs/ tests ordered today include:   No orders of the defined types were placed in this encounter.    Disposition:   FU with *** in {gen number 6-80:321224} {Days to years:10300}.   Patient is agreeable to this plan and will call if any problems develop in the interim.   SignedTruitt Merle, NP  08/30/2017 7:37 AM  [facility] [address] [place], [place]  [address] Phone: [phone] Fax: [phone]

## 2017-08-31 DIAGNOSIS — F5101 Primary insomnia: Secondary | ICD-10-CM | POA: Diagnosis not present

## 2017-08-31 DIAGNOSIS — G3184 Mild cognitive impairment, so stated: Secondary | ICD-10-CM | POA: Diagnosis not present

## 2017-09-01 ENCOUNTER — Encounter: Payer: Self-pay | Admitting: Nurse Practitioner

## 2017-09-07 DIAGNOSIS — K5900 Constipation, unspecified: Secondary | ICD-10-CM | POA: Diagnosis not present

## 2017-09-07 DIAGNOSIS — E119 Type 2 diabetes mellitus without complications: Secondary | ICD-10-CM | POA: Diagnosis not present

## 2017-09-07 DIAGNOSIS — R079 Chest pain, unspecified: Secondary | ICD-10-CM | POA: Diagnosis not present

## 2017-09-07 DIAGNOSIS — N400 Enlarged prostate without lower urinary tract symptoms: Secondary | ICD-10-CM | POA: Diagnosis not present

## 2017-09-07 DIAGNOSIS — E039 Hypothyroidism, unspecified: Secondary | ICD-10-CM | POA: Diagnosis not present

## 2017-09-07 DIAGNOSIS — G20 Parkinson's disease: Secondary | ICD-10-CM | POA: Diagnosis not present

## 2017-09-07 DIAGNOSIS — J302 Other seasonal allergic rhinitis: Secondary | ICD-10-CM | POA: Diagnosis not present

## 2017-09-07 DIAGNOSIS — K219 Gastro-esophageal reflux disease without esophagitis: Secondary | ICD-10-CM | POA: Diagnosis not present

## 2017-09-07 DIAGNOSIS — R600 Localized edema: Secondary | ICD-10-CM | POA: Diagnosis not present

## 2017-09-07 DIAGNOSIS — R05 Cough: Secondary | ICD-10-CM | POA: Diagnosis not present

## 2017-09-07 DIAGNOSIS — I10 Essential (primary) hypertension: Secondary | ICD-10-CM | POA: Diagnosis not present

## 2017-09-07 DIAGNOSIS — E782 Mixed hyperlipidemia: Secondary | ICD-10-CM | POA: Diagnosis not present

## 2017-09-10 DIAGNOSIS — E039 Hypothyroidism, unspecified: Secondary | ICD-10-CM | POA: Diagnosis not present

## 2017-09-21 DIAGNOSIS — G894 Chronic pain syndrome: Secondary | ICD-10-CM | POA: Diagnosis not present

## 2017-09-28 DIAGNOSIS — G3184 Mild cognitive impairment, so stated: Secondary | ICD-10-CM | POA: Diagnosis not present

## 2017-09-28 DIAGNOSIS — F5101 Primary insomnia: Secondary | ICD-10-CM | POA: Diagnosis not present

## 2017-10-05 DIAGNOSIS — R079 Chest pain, unspecified: Secondary | ICD-10-CM | POA: Diagnosis not present

## 2017-10-05 DIAGNOSIS — K219 Gastro-esophageal reflux disease without esophagitis: Secondary | ICD-10-CM | POA: Diagnosis not present

## 2017-10-05 DIAGNOSIS — E119 Type 2 diabetes mellitus without complications: Secondary | ICD-10-CM | POA: Diagnosis not present

## 2017-10-05 DIAGNOSIS — E782 Mixed hyperlipidemia: Secondary | ICD-10-CM | POA: Diagnosis not present

## 2017-10-05 DIAGNOSIS — J302 Other seasonal allergic rhinitis: Secondary | ICD-10-CM | POA: Diagnosis not present

## 2017-10-05 DIAGNOSIS — R600 Localized edema: Secondary | ICD-10-CM | POA: Diagnosis not present

## 2017-10-05 DIAGNOSIS — K5900 Constipation, unspecified: Secondary | ICD-10-CM | POA: Diagnosis not present

## 2017-10-05 DIAGNOSIS — N400 Enlarged prostate without lower urinary tract symptoms: Secondary | ICD-10-CM | POA: Diagnosis not present

## 2017-10-05 DIAGNOSIS — I10 Essential (primary) hypertension: Secondary | ICD-10-CM | POA: Diagnosis not present

## 2017-10-05 DIAGNOSIS — R05 Cough: Secondary | ICD-10-CM | POA: Diagnosis not present

## 2017-10-05 DIAGNOSIS — G20 Parkinson's disease: Secondary | ICD-10-CM | POA: Diagnosis not present

## 2017-10-05 DIAGNOSIS — E039 Hypothyroidism, unspecified: Secondary | ICD-10-CM | POA: Diagnosis not present

## 2017-10-06 DIAGNOSIS — E119 Type 2 diabetes mellitus without complications: Secondary | ICD-10-CM | POA: Diagnosis not present

## 2017-10-08 DIAGNOSIS — E119 Type 2 diabetes mellitus without complications: Secondary | ICD-10-CM | POA: Diagnosis not present

## 2017-10-08 DIAGNOSIS — I10 Essential (primary) hypertension: Secondary | ICD-10-CM | POA: Diagnosis not present

## 2017-10-08 DIAGNOSIS — E782 Mixed hyperlipidemia: Secondary | ICD-10-CM | POA: Diagnosis not present

## 2017-10-12 DIAGNOSIS — G4700 Insomnia, unspecified: Secondary | ICD-10-CM | POA: Diagnosis not present

## 2017-10-12 DIAGNOSIS — G3184 Mild cognitive impairment, so stated: Secondary | ICD-10-CM | POA: Diagnosis not present

## 2017-10-12 DIAGNOSIS — F4321 Adjustment disorder with depressed mood: Secondary | ICD-10-CM | POA: Diagnosis not present

## 2017-10-12 DIAGNOSIS — F5101 Primary insomnia: Secondary | ICD-10-CM | POA: Diagnosis not present

## 2017-10-12 DIAGNOSIS — F331 Major depressive disorder, recurrent, moderate: Secondary | ICD-10-CM | POA: Diagnosis not present

## 2017-10-18 NOTE — Progress Notes (Signed)
Cardiology Office Note   Date:  10/19/2017   ID:  [name], DOB [birth year], MRN [id number]  PCP:  Lajean Manes, MD  Cardiologist: Dr. Fransico Him, MD   Chief Complaint  Patient presents with  . Atrial Fibrillation  . Coronary Artery Disease  . Hypertension   History of Present Illness: [name] is a 84 y.o. male who presents for CAD follow up, seen for Dr. Radford Pax. He has a past medical history of NSTEMI (07/2017), DM2, HTN, HLD, CKD stage II, CVA, prostate cancer and hypothyroidism. He was initially seen by our service in consultation on 08/04/2017 for chest pain and elevated troponin.   He lives in an ALF and his wife of 29 years passed in February 2019. He ambulates with a cane and/or walker. He uses a recumbent bike approximately 30 minutes per day without exertional CP or dyspnea. When seen in July, he had an episode of sudden onset of deep substernal chest pain lasting approximately 30 minutes in duration with resolution after Maalox administration, thought to be GERD. His troponin levels were noted to elevated but flat with a peak of 0.18. EKG with SR with 1st degree AV block and PVC's. He underwent a Lexiscan stress test on 08/05/17 which revealed thinning of the inferior wall with moderate area of lateral wall ischemia at the mid and basal level with an EF of 58%. He was then scheduled for cardiac cath on 08/06/17 secondary to abnormal stress test. He underwent a DES to the [facility] with recommendations for DAPT with ASA 81 and Ticagrelor 90mg  twice daily for a minimum of 12 months. His echocardiogram showed and LVEF of 55-60% with NWMA and G2DD. He has known statin intolerance and will be referred to lipid clinic for possible PCSK9 initiation.   Unfortunately, he was seen again in the hospital on 08/15/2017 before he could be seen for follow up in the office. He was consulted for new onset PAF in the setting of recent ureter stent placement per Dr. Alinda Money. He spontaneously  converted to NSR. Given the self limited nature of the PAF, anticoagulation was not recommended unless recurrence. His Cardizem was changed to long acting CD 240mg  daily and he was last in NSR at Cardiology sign off. He was to follow closely in our office for TOC follow up on 08/30/17, however did not make that appointment secondary to transportation issues (comoing from ALF).   He presents today feeling well with no complaints. He is in NSR with 1st degree AV block per EKG and denies palpitations. He has been compliant with his DAPT and reports no bleeding concerns. He denies recurrent chest pain, SOB, fatigue, orthopnea symptoms, LE swelling, dizziness, presyncope or syncopal symptoms. He has continued to use the recumbent bike 7 days per week for approximately 30 min in duration without chest pain or other symptoms. Per chart review, he was placed on Diltiazem CD 240mg  PO daily during his last hospitalization for AF with RVR,  however it does not appear that this was ever prescribed at discharge. He remains in NSR today with HR 68 and stable BP. He has been taking Toprol-XL 25mg .   Past Medical History:  Diagnosis Date  . Allergic rhinitis   . Arthritis    arthritis-kyphosis  . Asthmatic bronchitis   . Chronic kidney disease   . DM type 2 (diabetes mellitus, type 2) ([place])   . DVT, lower extremity, proximal, acute ([place])   . Dyslipidemia   . Dysrhythmia    '  97 tachycardia- no recent issues  . GERD (gastroesophageal reflux disease)   . Hearing impaired    bilateral hearing aids  . History of kidney stones   . History of skin cancer   . Hypertension   . Hypothyroidism   . Macular degeneration    injections done bilaterally recent - 08-22-13  . Prostate cancer [facility])    Prostate cancer-radiation only,skin cancer-basal cell and last squamous cell right eye  . Stroke ([place])   . Urgency-frequency syndrome   . Urticaria     Past Surgical History:  Procedure Laterality Date  . BACK SURGERY      lumbar fracture  . cataract surgery Bilateral   . COLONOSCOPY W/ POLYPECTOMY     x2   . CORONARY STENT INTERVENTION N/A 08/06/2017   Procedure: CORONARY STENT INTERVENTION;  Surgeon: Jettie Booze, MD;  Location: [place] [facility];  Service: Cardiovascular;  Laterality: N/A;  . CYSTOSCOPY W/ URETERAL STENT PLACEMENT Right 08/13/2017   Procedure: CYSTOSCOPY WITH RIGHT  RETROGRADE PYELOGRAM/URETERAL STENT PLACEMENT;  Surgeon: Raynelle Bring, MD;  Location: [facility];  Service: Urology;  Laterality: Right;  . CYSTOSCOPY WITH INJECTION N/A 09/11/2013   Procedure: CYSTOSCOPY WITH BOTOX INJECTION INTO THE BLADDER;  Surgeon: Ailene Rud, MD;  Location: [facility];  Service: Urology;  Laterality: N/A;  . CYSTOSCOPY WITH RETROGRADE PYELOGRAM, URETEROSCOPY AND STENT PLACEMENT    . CYSTOSCOPY WITH STENT PLACEMENT Right 01/26/2016   Procedure: CYSTOSCOPY, RIGHT RETROGRADE WITH RIGHT URETERAL STENT PLACEMENT;  Surgeon: Cleon Gustin, MD;  Location: [facility];  Service: Urology;  Laterality: Right;  . CYSTOSCOPY/RETROGRADE/URETEROSCOPY Right 04/22/2016   Procedure: CYSTOSCOPY/RETROGRADE/ REMOVAL JJ STENT right insertion stent right insertion of foley;  Surgeon: Franchot Gallo, MD;  Location: [facility];  Service: Urology;  Laterality: Right;  . EXTRACORPOREAL SHOCK WAVE LITHOTRIPSY Right 02/27/2016   Procedure: RIGHT EXTRACORPOREAL SHOCK WAVE LITHOTRIPSY (ESWL) GAITED;  Surgeon: Cleon Gustin, MD;  Location: [facility];  Service: Urology;  Laterality: Right;  . EXTRACORPOREAL SHOCK WAVE LITHOTRIPSY Right 02/10/2016   Procedure: EXTRACORPOREAL SHOCK WAVE LITHOTRIPSY (ESWL);  Surgeon: Kathie Rhodes, MD;  Location: [facility];  Service: Urology;  Laterality: Right;  [facility]-[phone] [id number] [employer]- [id number]   . EYE SURGERY     macular degeneration  . GALLBLADDER SURGERY  2006   no cholecystectomy  . HERNIA REPAIR    . LEFT HEART CATH AND CORONARY ANGIOGRAPHY N/A 08/06/2017    Procedure: LEFT HEART CATH AND CORONARY ANGIOGRAPHY;  Surgeon: Jettie Booze, MD;  Location: [place] [facility];  Service: Cardiovascular;  Laterality: N/A;  . LUNG REMOVAL, PARTIAL  age 37   for bronchiectasis  . PARTIAL THYMECTOMY  1985   for nodules  . POPLITEAL SYNOVIAL CYST EXCISION  2005  . TOTAL KNEE ARTHROPLASTY  2008   right  . TOTAL KNEE ARTHROPLASTY  2010   left    Current Outpatient Medications  Medication Sig Dispense Refill  . albuterol (PROAIR HFA) 108 (90 Base) MCG/ACT inhaler Inhale 2 puffs into the lungs 3 (three) times daily as needed for wheezing or shortness of breath.    . alfuzosin (UROXATRAL) 10 MG 24 hr tablet Take 1 tablet (10 mg total) by mouth at bedtime. 30 tablet 0  . aspirin EC 81 MG tablet Take 81 mg by mouth daily.     Marland Kitchen atropine 1 % ophthalmic solution Place 1 drop into the right eye daily at 6pm for 14 days.    . brinzolamide (AZOPT)  1 % ophthalmic suspension Place 1 drop into the right eye 3 times daily.    . carbidopa-levodopa (SINEMET IR) 25-100 MG tablet Take 1 tablet by mouth 3 (three) times daily.     . Cholecalciferol (VITAMIN D-3) 5000 units TABS Take 5,000 Units by mouth daily.    . colesevelam (WELCHOL) 625 MG tablet Take 1,875 mg by mouth daily.     Marland Kitchen docusate sodium (COLACE) 100 MG capsule Take 100 mg by mouth daily.    . fexofenadine (ALLEGRA) 180 MG tablet Take 180 mg by mouth daily.    Marland Kitchen gabapentin (NEURONTIN) 100 MG capsule Take 100 mg by mouth at bedtime.    Marland Kitchen guaiFENesin (MUCINEX) 600 MG 12 hr tablet Take by mouth 2 (two) times daily.    . hydroxypropyl methylcellulose / hypromellose (ISOPTO TEARS / GONIOVISC) 2.5 % ophthalmic solution Place 1 drop into both eyes 4 (four) times daily.    Marland Kitchen latanoprost (XALATAN) 0.005 % ophthalmic solution Place 1 drop into the right eye daily.    Marland Kitchen levothyroxine (SYNTHROID, LEVOTHROID) 175 MCG tablet Take 175 mcg by mouth daily before breakfast.    . Melatonin 3 MG TABS Take 3 mg by mouth at  bedtime.    . Meth-Hyo-M Bl-Na Phos-Ph Sal (URIBEL) 118 MG CAPS Take 118 mg by mouth 3 (three) times daily as needed (BURNING).    Marland Kitchen metoprolol succinate (TOPROL-XL) 25 MG 24 hr tablet Take 1 tablet (25 mg total) by mouth daily. 30 tablet 0  . mirabegron ER (MYRBETRIQ) 50 MG TB24 tablet Take 1 tablet (50 mg total) by mouth daily. 30 tablet 0  . Multiple Vitamins-Minerals (ICAPS) TABS Take 1 tablet by mouth daily.    Marland Kitchen omeprazole (PRILOSEC) 40 MG capsule Take 40 mg by mouth daily.    . polyethylene glycol (MIRALAX / GLYCOLAX) packet Take 17 g by mouth daily.    . potassium chloride (K-DUR,KLOR-CON) 10 MEQ tablet Take 10 mEq by mouth daily.    . sitaGLIPtin (JANUVIA) 50 MG tablet Take 1 tablet (50 mg total) by mouth daily. 30 tablet 1  . ticagrelor (BRILINTA) 90 MG TABS tablet Take 1 tablet (90 mg total) by mouth 2 (two) times daily. 60 tablet 0  . traMADol (ULTRAM) 50 MG tablet Take 25 mg by mouth 2 (two) times daily.    Marland Kitchen zolpidem (AMBIEN) 5 MG tablet Take 0.5 tablets (2.5 mg total) by mouth at bedtime as needed for sleep. 15 tablet 5   No current facility-administered medications for this visit.     Allergies:   Colchicine; Hibiclens [chlorhexidine]; Atorvastatin; Ceftin; Celebrex [celecoxib]; Codeine; Contrast media [iodinated diagnostic agents]; Erythromycin; Ezetimibe; Oxycodone-acetaminophen; Rosuvastatin; Simvastatin; Cefpodoxime; Cefuroxime axetil; Nitroglycerin; Tetanus toxoid; and Vantin    Social History:  The patient  reports that he has never smoked. He has never used smokeless tobacco. He reports that he does not drink alcohol or use drugs.   Family History:  The patient's family history includes Heart attack in his father; Heart disease in his father; Hypertension in his mother; Other in his sister; Stroke (age of onset: 34) in his mother.    ROS:  Please see the history of present illness.   Otherwise, review of systems are positive for none.   All other systems are reviewed  and negative.   PHYSICAL EXAM: VS:  BP 128/70   Pulse 77   Ht 5\' 11"  (1.803 m)   Wt 152 lb (68.9 kg)   SpO2 98%   BMI 21.20 kg/m  ,  BMI Body mass index is 21.2 kg/m.  General: Elderly, frail, NAD Skin: Warm, dry, intact  Head: Normocephalic, atraumatic, moist mucus membranes. Neck: Negative for carotid bruits. No JVD Lungs:Clear to ausculation bilaterally. No wheezes, rales, or rhonchi. Breathing is unlabored. Cardiovascular: RRR with S1 S2. No murmurs, rubs, gallops, or LV heave appreciated. Abdomen: Soft, non-tender, non-distended with normoactive bowel sounds.  MSK: Strength and tone appear normal for age. 5/5 in all extremities Extremities: No edema. No clubbing or cyanosis. DP/PT pulses 2+ bilaterally Neuro: Alert and oriented. No focal deficits. No facial asymmetry. MAE spontaneously. Psych: Responds to questions appropriately with normal affect.     EKG:  EKG is ordered today. The ekg ordered today demonstrates NSR with 1st degree AV block   Recent Labs: 08/04/2017: B Natriuretic Peptide 431.0 08/13/2017: ALT <5 08/14/2017: TSH 0.609 08/17/2017: BUN 19; Creatinine, Ser 1.07; Hemoglobin 12.5; Platelets 210; Potassium 3.8; Sodium 137   Lipid Panel    Component Value Date/Time   CHOL 157 08/04/2017 0320   TRIG 57 08/04/2017 0320   HDL 40 (L) 08/04/2017 0320   CHOLHDL 3.9 08/04/2017 0320   VLDL 11 08/04/2017 0320   LDLCALC 106 (H) 08/04/2017 0320   LDLDIRECT 153.9 04/02/2011 0805     Wt Readings from Last 3 Encounters:  10/19/17 152 lb (68.9 kg)  08/13/17 143 lb (64.9 kg)  08/05/17 143 lb 12.8 oz (65.2 kg)     Other studies Reviewed: Additional studies/ records that were reviewed today include:   Cardiac cath 08/06/2017 Conclusion     Mid RCA to Dist RCA lesion is 25% stenosed.  Ost 1st Mrg to 1st Mrg lesion is 70% stenosed.  Mid Cx lesion is 80% stenosed.  A drug-eluting stent was successfully placed using a STENT SYNERGY DES 2.25X20.  Post  intervention, there is a 0% residual stenosis.  Mid LAD lesion is 25% stenosed.  LV end diastolic pressure is normal.  There is no aortic valve stenosis.   Recommend uninterrupted dual antiplatelet therapy with Aspirin 81mg  daily and Ticagrelor 90mg  twice dailyfor a minimum of 12 months (ACS - Class I recommendation).   2D echo Study Conclusions  - Left ventricle: The cavity size was normal. There was mild focal basal hypertrophy of the septum. Systolic function was normal. The estimated ejection fraction was in the range of 55% to 60%. Wall motion was normal; there were no regional wall motion abnormalities. Features are consistent with a pseudonormal left ventricular filling pattern, with concomitant abnormal relaxation and increased filling pressure (grade 2 diastolic dysfunction). - Mitral valve: Calcified annulus. Mildly thickened, mildly calcified leaflets . Mild diffuse thickening of the anterior leaflet, consistent with rheumatic disease. - Left atrium: The atrium was moderately dilated. - Atrial septum: No defect or patent foramen ovale was identified.   ASSESSMENT AND PLAN:  1. CAD with hx of NSTEMI and PCI/DES to [facility] 07/2017: -Denies chest pain or other ACS symptoms -Continue with ASA, Brilinta 90mg  twice daily -Continue Toprol-XL 25mg  daily  -Avoid long acting nitrates secondary to hx of hypotension  -Continues to do well, exercising with recumbent bike 7/days week -No statin secondary to intolerance>>lipid clinic referral   2. PAF: -Had one short episode of PAF after recent ureter stent placement in 08/2017 with spontaneous conversion to NSR with no indication for anticoagulation  -Does not appear to have had recurrent episode>>NSR with 1st degree AV block per office EKG -Denies palpitations  -Was started on Diltiazem CD 240mg  in inpatient however it does not appear  that this was ever prescribed at discharge>>>stable without   -Continue  Toprol-XL 25mg  twice daily for rate control  3. Chronic diastolic CHF: -Per most recent echocardiogram with LVEF of 55-60% with NWMA and G2DD also in the setting of advanced age and CKD with GFR at 31ml/min -Does not appears to be fluid volume overloaded today -Monitor with serial echocardiogram   3. HTN; -Stable, 128/70 -Continue current regimen   4. HLD: -LDL goal of <70 -LDL at 106mg /dl on 08/04/2017>>needs repeat lab in 01/2018 -Hx of statin intolerance -Potential for PCSK9 with lipid clinic>>will have facility draw fasting lipid profile    5. DM2: -Controlled, HbA21c 6.3  -Per PCP   Current medicines are reviewed at length with the patient today.  The patient does not have concerns regarding medicines.  The following changes have been made:  no change  Labs/ tests ordered today include: BMET, CBC, lipid panel at facility   Orders Placed This Encounter  Procedures  . CBC  . Basic metabolic panel  . AMB Referral to [facility]  . EKG 12-Lead   Disposition:   FU with Dr. Radford Pax in 3 months  Signed, Kathyrn Drown, NP  10/19/2017 10:07 AM    [address], [place], [place]  [address] Phone: [phone]; Fax: [phone]

## 2017-10-19 ENCOUNTER — Encounter: Payer: Self-pay | Admitting: Cardiology

## 2017-10-19 ENCOUNTER — Ambulatory Visit (INDEPENDENT_AMBULATORY_CARE_PROVIDER_SITE_OTHER): Payer: Medicare Other | Admitting: Cardiology

## 2017-10-19 VITALS — BP 128/70 | HR 77 | Ht 71.0 in | Wt 152.0 lb

## 2017-10-19 DIAGNOSIS — G4700 Insomnia, unspecified: Secondary | ICD-10-CM | POA: Diagnosis not present

## 2017-10-19 DIAGNOSIS — I214 Non-ST elevation (NSTEMI) myocardial infarction: Secondary | ICD-10-CM

## 2017-10-19 DIAGNOSIS — I2510 Atherosclerotic heart disease of native coronary artery without angina pectoris: Secondary | ICD-10-CM | POA: Diagnosis not present

## 2017-10-19 DIAGNOSIS — E785 Hyperlipidemia, unspecified: Secondary | ICD-10-CM

## 2017-10-19 DIAGNOSIS — I4891 Unspecified atrial fibrillation: Secondary | ICD-10-CM

## 2017-10-19 DIAGNOSIS — G894 Chronic pain syndrome: Secondary | ICD-10-CM | POA: Diagnosis not present

## 2017-10-19 DIAGNOSIS — I10 Essential (primary) hypertension: Secondary | ICD-10-CM | POA: Diagnosis not present

## 2017-10-19 DIAGNOSIS — Z79899 Other long term (current) drug therapy: Secondary | ICD-10-CM | POA: Diagnosis not present

## 2017-10-19 DIAGNOSIS — F4321 Adjustment disorder with depressed mood: Secondary | ICD-10-CM | POA: Diagnosis not present

## 2017-10-19 LAB — BASIC METABOLIC PANEL
BUN/Creatinine Ratio: 17 (ref 10–24)
BUN: 23 mg/dL (ref 8–27)
CALCIUM: 8.8 mg/dL (ref 8.6–10.2)
CHLORIDE: 107 mmol/L — AB (ref 96–106)
CO2: 22 mmol/L (ref 20–29)
Creatinine, Ser: 1.39 mg/dL — ABNORMAL HIGH (ref 0.76–1.27)
GFR calc Af Amer: 54 mL/min/{1.73_m2} — ABNORMAL LOW (ref >59)
GFR, EST NON AFRICAN AMERICAN: 47 mL/min/{1.73_m2} — AB (ref >59)
Glucose: 102 mg/dL — ABNORMAL HIGH (ref 65–99)
Potassium: 4.9 mmol/L (ref 3.5–5.2)
Sodium: 142 mmol/L (ref 134–144)

## 2017-10-19 LAB — CBC
HEMATOCRIT: 33.7 % — AB (ref 37.5–51.0)
Hemoglobin: 11.4 g/dL — ABNORMAL LOW (ref 13.0–17.7)
MCH: 29.7 pg (ref 26.6–33.0)
MCHC: 33.8 g/dL (ref 31.5–35.7)
MCV: 88 fL (ref 79–97)
PLATELETS: 200 10*3/uL (ref 150–450)
RBC: 3.84 x10E6/uL — ABNORMAL LOW (ref 4.14–5.80)
RDW: 14.7 % (ref 12.3–15.4)
WBC: 5.1 10*3/uL (ref 3.4–10.8)

## 2017-10-19 NOTE — Patient Instructions (Signed)
.  Medication Instructions:  Your physician recommends that you continue on your current medications as directed. Please refer to the Current Medication list given to you today.  If you need a refill on your cardiac medications before your next appointment, please call your pharmacy.   Lab work: TODAY:  BMET & CBC  If you have labs (blood work) drawn today and your tests are completely normal, you will receive your results only by: Marland Kitchen MyChart Message (if you have MyChart) OR . A paper copy in the mail If you have any lab test that is abnormal or we need to change your treatment, we will call you to review the results.  Testing/Procedures: None ordere  Follow-Up: At [facility], you and your health needs are our priority.  As part of our continuing mission to provide you with exceptional heart care, we have created designated Provider Care Teams.  These Care Teams include your primary Cardiologist (physician) and Advanced Practice Providers (APPs -  Physician Assistants and Nurse Practitioners) who all work together to provide you with the care you need, when you need it. You will need a follow up appointment in 3 months.  Please call our office 2 months in advance to schedule this appointment.  You may see Fransico Him, MD or one of the following Advanced Practice Providers on your designated Care Team:   [place]alo Soapstone, PA-C Melina Copa, PA-C . Ermalinda Barrios, PA-C  Any Other Special Instructions Will Be Listed Below (If Applicable).

## 2017-10-20 ENCOUNTER — Other Ambulatory Visit: Payer: Self-pay | Admitting: Cardiology

## 2017-10-20 DIAGNOSIS — E785 Hyperlipidemia, unspecified: Secondary | ICD-10-CM | POA: Diagnosis not present

## 2017-10-21 LAB — LIPID PANEL W/O CHOL/HDL RATIO
Cholesterol, Total: 148 mg/dL (ref 100–199)
HDL: 46 mg/dL (ref >39)
LDL CALC: 90 mg/dL (ref 0–99)
Triglycerides: 58 mg/dL (ref 0–149)
VLDL CHOLESTEROL CAL: 12 mg/dL (ref 5–40)

## 2017-10-25 DIAGNOSIS — N201 Calculus of ureter: Secondary | ICD-10-CM | POA: Diagnosis not present

## 2017-11-02 DIAGNOSIS — G4700 Insomnia, unspecified: Secondary | ICD-10-CM | POA: Diagnosis not present

## 2017-11-02 DIAGNOSIS — F4321 Adjustment disorder with depressed mood: Secondary | ICD-10-CM | POA: Diagnosis not present

## 2017-11-09 DIAGNOSIS — G4700 Insomnia, unspecified: Secondary | ICD-10-CM | POA: Diagnosis not present

## 2017-11-09 DIAGNOSIS — R079 Chest pain, unspecified: Secondary | ICD-10-CM | POA: Diagnosis not present

## 2017-11-09 DIAGNOSIS — F331 Major depressive disorder, recurrent, moderate: Secondary | ICD-10-CM | POA: Diagnosis not present

## 2017-11-09 DIAGNOSIS — J302 Other seasonal allergic rhinitis: Secondary | ICD-10-CM | POA: Diagnosis not present

## 2017-11-09 DIAGNOSIS — G20 Parkinson's disease: Secondary | ICD-10-CM | POA: Diagnosis not present

## 2017-11-09 DIAGNOSIS — F5101 Primary insomnia: Secondary | ICD-10-CM | POA: Diagnosis not present

## 2017-11-09 DIAGNOSIS — R600 Localized edema: Secondary | ICD-10-CM | POA: Diagnosis not present

## 2017-11-09 DIAGNOSIS — E119 Type 2 diabetes mellitus without complications: Secondary | ICD-10-CM | POA: Diagnosis not present

## 2017-11-09 DIAGNOSIS — I10 Essential (primary) hypertension: Secondary | ICD-10-CM | POA: Diagnosis not present

## 2017-11-09 DIAGNOSIS — E782 Mixed hyperlipidemia: Secondary | ICD-10-CM | POA: Diagnosis not present

## 2017-11-09 DIAGNOSIS — R05 Cough: Secondary | ICD-10-CM | POA: Diagnosis not present

## 2017-11-09 DIAGNOSIS — K5900 Constipation, unspecified: Secondary | ICD-10-CM | POA: Diagnosis not present

## 2017-11-09 DIAGNOSIS — E039 Hypothyroidism, unspecified: Secondary | ICD-10-CM | POA: Diagnosis not present

## 2017-11-09 DIAGNOSIS — F4321 Adjustment disorder with depressed mood: Secondary | ICD-10-CM | POA: Diagnosis not present

## 2017-11-09 DIAGNOSIS — K219 Gastro-esophageal reflux disease without esophagitis: Secondary | ICD-10-CM | POA: Diagnosis not present

## 2017-11-09 DIAGNOSIS — G3184 Mild cognitive impairment, so stated: Secondary | ICD-10-CM | POA: Diagnosis not present

## 2017-11-09 DIAGNOSIS — N400 Enlarged prostate without lower urinary tract symptoms: Secondary | ICD-10-CM | POA: Diagnosis not present

## 2017-11-16 DIAGNOSIS — G4700 Insomnia, unspecified: Secondary | ICD-10-CM | POA: Diagnosis not present

## 2017-11-16 DIAGNOSIS — F4321 Adjustment disorder with depressed mood: Secondary | ICD-10-CM | POA: Diagnosis not present

## 2017-11-23 DIAGNOSIS — G894 Chronic pain syndrome: Secondary | ICD-10-CM | POA: Diagnosis not present

## 2017-11-23 DIAGNOSIS — G4700 Insomnia, unspecified: Secondary | ICD-10-CM | POA: Diagnosis not present

## 2017-11-23 DIAGNOSIS — F4321 Adjustment disorder with depressed mood: Secondary | ICD-10-CM | POA: Diagnosis not present

## 2017-12-03 DIAGNOSIS — F4321 Adjustment disorder with depressed mood: Secondary | ICD-10-CM | POA: Diagnosis not present

## 2017-12-03 DIAGNOSIS — G4700 Insomnia, unspecified: Secondary | ICD-10-CM | POA: Diagnosis not present

## 2017-12-07 DIAGNOSIS — F4321 Adjustment disorder with depressed mood: Secondary | ICD-10-CM | POA: Diagnosis not present

## 2017-12-07 DIAGNOSIS — G4700 Insomnia, unspecified: Secondary | ICD-10-CM | POA: Diagnosis not present

## 2017-12-14 DIAGNOSIS — G4700 Insomnia, unspecified: Secondary | ICD-10-CM | POA: Diagnosis not present

## 2017-12-14 DIAGNOSIS — F4321 Adjustment disorder with depressed mood: Secondary | ICD-10-CM | POA: Diagnosis not present

## 2017-12-21 DIAGNOSIS — G4700 Insomnia, unspecified: Secondary | ICD-10-CM | POA: Diagnosis not present

## 2017-12-21 DIAGNOSIS — F4321 Adjustment disorder with depressed mood: Secondary | ICD-10-CM | POA: Diagnosis not present

## 2017-12-22 DIAGNOSIS — G894 Chronic pain syndrome: Secondary | ICD-10-CM | POA: Diagnosis not present

## 2017-12-28 DIAGNOSIS — R05 Cough: Secondary | ICD-10-CM | POA: Diagnosis not present

## 2017-12-28 DIAGNOSIS — K219 Gastro-esophageal reflux disease without esophagitis: Secondary | ICD-10-CM | POA: Diagnosis not present

## 2017-12-28 DIAGNOSIS — E119 Type 2 diabetes mellitus without complications: Secondary | ICD-10-CM | POA: Diagnosis not present

## 2017-12-28 DIAGNOSIS — R079 Chest pain, unspecified: Secondary | ICD-10-CM | POA: Diagnosis not present

## 2017-12-28 DIAGNOSIS — N400 Enlarged prostate without lower urinary tract symptoms: Secondary | ICD-10-CM | POA: Diagnosis not present

## 2017-12-28 DIAGNOSIS — G20 Parkinson's disease: Secondary | ICD-10-CM | POA: Diagnosis not present

## 2017-12-28 DIAGNOSIS — E782 Mixed hyperlipidemia: Secondary | ICD-10-CM | POA: Diagnosis not present

## 2017-12-28 DIAGNOSIS — E039 Hypothyroidism, unspecified: Secondary | ICD-10-CM | POA: Diagnosis not present

## 2017-12-28 DIAGNOSIS — J302 Other seasonal allergic rhinitis: Secondary | ICD-10-CM | POA: Diagnosis not present

## 2017-12-28 DIAGNOSIS — K5900 Constipation, unspecified: Secondary | ICD-10-CM | POA: Diagnosis not present

## 2017-12-28 DIAGNOSIS — R600 Localized edema: Secondary | ICD-10-CM | POA: Diagnosis not present

## 2017-12-28 DIAGNOSIS — I10 Essential (primary) hypertension: Secondary | ICD-10-CM | POA: Diagnosis not present

## 2017-12-29 DIAGNOSIS — Z79899 Other long term (current) drug therapy: Secondary | ICD-10-CM | POA: Diagnosis not present

## 2017-12-31 DIAGNOSIS — G4700 Insomnia, unspecified: Secondary | ICD-10-CM | POA: Diagnosis not present

## 2017-12-31 DIAGNOSIS — F4321 Adjustment disorder with depressed mood: Secondary | ICD-10-CM | POA: Diagnosis not present

## 2018-01-07 DIAGNOSIS — F4321 Adjustment disorder with depressed mood: Secondary | ICD-10-CM | POA: Diagnosis not present

## 2018-01-07 DIAGNOSIS — G4700 Insomnia, unspecified: Secondary | ICD-10-CM | POA: Diagnosis not present

## 2018-01-11 DIAGNOSIS — G4700 Insomnia, unspecified: Secondary | ICD-10-CM | POA: Diagnosis not present

## 2018-01-11 DIAGNOSIS — F4321 Adjustment disorder with depressed mood: Secondary | ICD-10-CM | POA: Diagnosis not present

## 2018-01-20 ENCOUNTER — Ambulatory Visit: Payer: Medicare Other | Admitting: Cardiology

## 2018-03-10 ENCOUNTER — Encounter (HOSPITAL_COMMUNITY): Payer: Self-pay | Admitting: Emergency Medicine

## 2018-03-10 ENCOUNTER — Observation Stay (HOSPITAL_COMMUNITY)
Admission: EM | Admit: 2018-03-10 | Discharge: 2018-03-11 | Disposition: A | Discharge status: Home / Self Care | Payer: Medicare Other | Attending: Family Medicine | Admitting: Family Medicine

## 2018-03-10 ENCOUNTER — Observation Stay (HOSPITAL_COMMUNITY): Payer: Medicare Other

## 2018-03-10 DIAGNOSIS — Z8546 Personal history of malignant neoplasm of prostate: Secondary | ICD-10-CM | POA: Insufficient documentation

## 2018-03-10 DIAGNOSIS — Z85828 Personal history of other malignant neoplasm of skin: Secondary | ICD-10-CM | POA: Diagnosis not present

## 2018-03-10 DIAGNOSIS — N183 Chronic kidney disease, stage 3 unspecified: Secondary | ICD-10-CM | POA: Diagnosis present

## 2018-03-10 DIAGNOSIS — Z79899 Other long term (current) drug therapy: Secondary | ICD-10-CM | POA: Diagnosis not present

## 2018-03-10 DIAGNOSIS — Z794 Long term (current) use of insulin: Secondary | ICD-10-CM

## 2018-03-10 DIAGNOSIS — I4891 Unspecified atrial fibrillation: Principal | ICD-10-CM | POA: Diagnosis present

## 2018-03-10 DIAGNOSIS — E785 Hyperlipidemia, unspecified: Secondary | ICD-10-CM | POA: Diagnosis not present

## 2018-03-10 DIAGNOSIS — R0789 Other chest pain: Secondary | ICD-10-CM | POA: Diagnosis present

## 2018-03-10 DIAGNOSIS — E1122 Type 2 diabetes mellitus with diabetic chronic kidney disease: Secondary | ICD-10-CM | POA: Diagnosis not present

## 2018-03-10 DIAGNOSIS — Z96653 Presence of artificial knee joint, bilateral: Secondary | ICD-10-CM | POA: Diagnosis not present

## 2018-03-10 DIAGNOSIS — I25118 Atherosclerotic heart disease of native coronary artery with other forms of angina pectoris: Secondary | ICD-10-CM

## 2018-03-10 DIAGNOSIS — E119 Type 2 diabetes mellitus without complications: Secondary | ICD-10-CM

## 2018-03-10 DIAGNOSIS — I129 Hypertensive chronic kidney disease with stage 1 through stage 4 chronic kidney disease, or unspecified chronic kidney disease: Secondary | ICD-10-CM | POA: Diagnosis not present

## 2018-03-10 DIAGNOSIS — I248 Other forms of acute ischemic heart disease: Secondary | ICD-10-CM | POA: Diagnosis not present

## 2018-03-10 DIAGNOSIS — E039 Hypothyroidism, unspecified: Secondary | ICD-10-CM | POA: Insufficient documentation

## 2018-03-10 DIAGNOSIS — R079 Chest pain, unspecified: Secondary | ICD-10-CM | POA: Diagnosis present

## 2018-03-10 DIAGNOSIS — Z8673 Personal history of transient ischemic attack (TIA), and cerebral infarction without residual deficits: Secondary | ICD-10-CM | POA: Diagnosis not present

## 2018-03-10 DIAGNOSIS — I10 Essential (primary) hypertension: Secondary | ICD-10-CM | POA: Diagnosis not present

## 2018-03-10 DIAGNOSIS — Z7982 Long term (current) use of aspirin: Secondary | ICD-10-CM | POA: Insufficient documentation

## 2018-03-10 DIAGNOSIS — I2510 Atherosclerotic heart disease of native coronary artery without angina pectoris: Secondary | ICD-10-CM | POA: Insufficient documentation

## 2018-03-10 LAB — CBC WITH DIFFERENTIAL/PLATELET
Abs Immature Granulocytes: 0.02 10*3/uL (ref 0.00–0.07)
BASOS ABS: 0.1 10*3/uL (ref 0.0–0.1)
BASOS PCT: 1 %
EOS ABS: 0.5 10*3/uL (ref 0.0–0.5)
EOS PCT: 9 %
HCT: 30.5 % — ABNORMAL LOW (ref 39.0–52.0)
Hemoglobin: 9.3 g/dL — ABNORMAL LOW (ref 13.0–17.0)
IMMATURE GRANULOCYTES: 0 %
Lymphocytes Relative: 22 %
Lymphs Abs: 1.3 10*3/uL (ref 0.7–4.0)
MCH: 27.7 pg (ref 26.0–34.0)
MCHC: 30.5 g/dL (ref 30.0–36.0)
MCV: 90.8 fL (ref 80.0–100.0)
Monocytes Absolute: 0.7 10*3/uL (ref 0.1–1.0)
Monocytes Relative: 12 %
NEUTROS PCT: 56 %
NRBC: 0 % (ref 0.0–0.2)
Neutro Abs: 3.3 10*3/uL (ref 1.7–7.7)
PLATELETS: 197 10*3/uL (ref 150–400)
RBC: 3.36 MIL/uL — AB (ref 4.22–5.81)
RDW: 12.9 % (ref 11.5–15.5)
WBC: 6 10*3/uL (ref 4.0–10.5)

## 2018-03-10 LAB — COMPREHENSIVE METABOLIC PANEL
ALT: <5 U/L (ref 0–44)
AST: 6 U/L — ABNORMAL LOW (ref 15–41)
Albumin: 3.2 g/dL — ABNORMAL LOW (ref 3.5–5.0)
Alkaline Phosphatase: 67 U/L (ref 38–126)
Anion gap: 8 (ref 5–15)
BUN: 29 mg/dL — ABNORMAL HIGH (ref 8–23)
CHLORIDE: 110 mmol/L (ref 98–111)
CO2: 21 mmol/L — ABNORMAL LOW (ref 22–32)
CREATININE: 1.54 mg/dL — AB (ref 0.61–1.24)
Calcium: 8.3 mg/dL — ABNORMAL LOW (ref 8.9–10.3)
GFR, EST AFRICAN AMERICAN: 47 mL/min — AB (ref >60)
GFR, EST NON AFRICAN AMERICAN: 41 mL/min — AB (ref >60)
Glucose, Bld: 113 mg/dL — ABNORMAL HIGH (ref 70–99)
Potassium: 3.7 mmol/L (ref 3.5–5.1)
Sodium: 139 mmol/L (ref 135–145)
Total Bilirubin: 0.9 mg/dL (ref 0.3–1.2)
Total Protein: 5.7 g/dL — ABNORMAL LOW (ref 6.5–8.1)

## 2018-03-10 LAB — TSH: TSH: 0.184 u[IU]/mL — AB (ref 0.350–4.500)

## 2018-03-10 LAB — I-STAT TROPONIN, ED: Troponin i, poc: 0 ng/mL (ref 0.00–0.08)

## 2018-03-10 LAB — GLUCOSE, CAPILLARY
Glucose-Capillary: 98 mg/dL (ref 70–99)
Glucose-Capillary: 170 mg/dL — ABNORMAL HIGH (ref 70–99)
Glucose-Capillary: 77 mg/dL (ref 70–99)

## 2018-03-10 LAB — T4, FREE: Free T4: 1.37 ng/dL (ref 0.82–1.77)

## 2018-03-10 LAB — PROTIME-INR
INR: 1.1 (ref 0.8–1.2)
Prothrombin Time: 13.7 seconds (ref 11.4–15.2)

## 2018-03-10 LAB — TROPONIN I
Troponin I: 0.31 ng/mL (ref <0.03)
Troponin I: 0.29 ng/mL (ref <0.03)
Troponin I: 0.27 ng/mL (ref <0.03)

## 2018-03-10 MED ORDER — METOPROLOL SUCCINATE ER 25 MG PO TB24
12.5000 mg | ORAL_TABLET | Freq: Every day | ORAL | Status: DC
  Administered 2018-03-10 – 2018-03-11 (×2): 12.5 mg via ORAL
  Filled 2018-03-10 (×2): qty 1

## 2018-03-10 MED ORDER — TIMOLOL MALEATE 0.5 % OP SOLN
1.0000 [drp] | Freq: Two times a day (BID) | OPHTHALMIC | Status: DC
  Administered 2018-03-10 (×2): 1 [drp] via OPHTHALMIC
  Filled 2018-03-10: qty 5

## 2018-03-10 MED ORDER — CARBIDOPA-LEVODOPA 25-100 MG PO TABS
1.0000 | ORAL_TABLET | ORAL | Status: DC
  Administered 2018-03-10 – 2018-03-11 (×3): 1 via ORAL
  Filled 2018-03-10 (×4): qty 1

## 2018-03-10 MED ORDER — COLESEVELAM HCL 625 MG PO TABS
1875.0000 mg | ORAL_TABLET | Freq: Every day | ORAL | Status: DC
  Administered 2018-03-10 – 2018-03-11 (×2): 1875 mg via ORAL
  Filled 2018-03-10 (×2): qty 3

## 2018-03-10 MED ORDER — INSULIN ASPART 100 UNIT/ML ~~LOC~~ SOLN
0.0000 [IU] | Freq: Three times a day (TID) | SUBCUTANEOUS | Status: DC
  Administered 2018-03-10: 3 [IU] via SUBCUTANEOUS
  Administered 2018-03-11: 2 [IU] via SUBCUTANEOUS

## 2018-03-10 MED ORDER — SODIUM CHLORIDE 0.9 % IV BOLUS
1000.0000 mL | Freq: Once | INTRAVENOUS | Status: AC
  Administered 2018-03-10: 1000 mL via INTRAVENOUS

## 2018-03-10 MED ORDER — LEVOTHYROXINE SODIUM 75 MCG PO TABS
175.0000 ug | ORAL_TABLET | Freq: Every day | ORAL | Status: DC
  Administered 2018-03-11: 175 ug via ORAL
  Filled 2018-03-10: qty 1

## 2018-03-10 MED ORDER — HYPROMELLOSE (GONIOSCOPIC) 2.5 % OP SOLN
1.0000 [drp] | Freq: Four times a day (QID) | OPHTHALMIC | Status: DC
  Filled 2018-03-10: qty 15

## 2018-03-10 MED ORDER — BRIMONIDINE TARTRATE 0.2 % OP SOLN
1.0000 [drp] | Freq: Two times a day (BID) | OPHTHALMIC | Status: DC
  Administered 2018-03-10 (×2): 1 [drp] via OPHTHALMIC
  Filled 2018-03-10: qty 5

## 2018-03-10 MED ORDER — CLOPIDOGREL BISULFATE 75 MG PO TABS
75.0000 mg | ORAL_TABLET | Freq: Every day | ORAL | Status: DC
  Administered 2018-03-10 – 2018-03-11 (×2): 75 mg via ORAL
  Filled 2018-03-10 (×2): qty 1

## 2018-03-10 MED ORDER — BRINZOLAMIDE 1 % OP SUSP
1.0000 [drp] | Freq: Three times a day (TID) | OPHTHALMIC | Status: DC
  Administered 2018-03-10 (×2): 1 [drp] via OPHTHALMIC
  Filled 2018-03-10: qty 10

## 2018-03-10 MED ORDER — PANTOPRAZOLE SODIUM 40 MG PO TBEC
40.0000 mg | DELAYED_RELEASE_TABLET | Freq: Every day | ORAL | Status: DC
  Administered 2018-03-10 – 2018-03-11 (×2): 40 mg via ORAL
  Filled 2018-03-10 (×2): qty 1

## 2018-03-10 MED ORDER — BRIMONIDINE TARTRATE-TIMOLOL 0.2-0.5 % OP SOLN
1.0000 [drp] | Freq: Two times a day (BID) | OPHTHALMIC | Status: DC
  Filled 2018-03-10: qty 5

## 2018-03-10 MED ORDER — LATANOPROST 0.005 % OP SOLN
1.0000 [drp] | Freq: Every day | OPHTHALMIC | Status: DC
  Administered 2018-03-10: 1 [drp] via OPHTHALMIC
  Filled 2018-03-10: qty 2.5

## 2018-03-10 MED ORDER — DILTIAZEM HCL-DEXTROSE 100-5 MG/100ML-% IV SOLN (PREMIX)
5.0000 mg/h | INTRAVENOUS | Status: DC
  Administered 2018-03-10: 5 mg/h via INTRAVENOUS
  Filled 2018-03-10: qty 100

## 2018-03-10 MED ORDER — POLYVINYL ALCOHOL 1.4 % OP SOLN
1.0000 [drp] | Freq: Four times a day (QID) | OPHTHALMIC | Status: DC
  Administered 2018-03-10 (×3): 1 [drp] via OPHTHALMIC
  Filled 2018-03-10: qty 15

## 2018-03-10 MED ORDER — ALFUZOSIN HCL ER 10 MG PO TB24
10.0000 mg | ORAL_TABLET | Freq: Every day | ORAL | Status: DC
  Administered 2018-03-10: 10 mg via ORAL
  Filled 2018-03-10: qty 1

## 2018-03-10 MED ORDER — DILTIAZEM HCL ER COATED BEADS 120 MG PO CP24
120.0000 mg | ORAL_CAPSULE | Freq: Every day | ORAL | Status: DC
  Administered 2018-03-10 – 2018-03-11 (×2): 120 mg via ORAL
  Filled 2018-03-10 (×2): qty 1

## 2018-03-10 MED ORDER — MIRABEGRON ER 25 MG PO TB24
50.0000 mg | ORAL_TABLET | Freq: Every day | ORAL | Status: DC
  Administered 2018-03-10 – 2018-03-11 (×2): 50 mg via ORAL
  Filled 2018-03-10 (×2): qty 2

## 2018-03-10 MED ORDER — DOCUSATE SODIUM 100 MG PO CAPS
100.0000 mg | ORAL_CAPSULE | Freq: Two times a day (BID) | ORAL | Status: DC
  Administered 2018-03-10 – 2018-03-11 (×2): 100 mg via ORAL
  Filled 2018-03-10 (×2): qty 1

## 2018-03-10 MED ORDER — POLYETHYLENE GLYCOL 3350 17 G PO PACK
17.0000 g | PACK | Freq: Every day | ORAL | Status: DC
  Administered 2018-03-11: 17 g via ORAL
  Filled 2018-03-10: qty 1

## 2018-03-10 MED ORDER — GABAPENTIN 100 MG PO CAPS
100.0000 mg | ORAL_CAPSULE | Freq: Every day | ORAL | Status: DC
  Administered 2018-03-10: 100 mg via ORAL
  Filled 2018-03-10: qty 1

## 2018-03-10 MED ORDER — INSULIN ASPART 100 UNIT/ML ~~LOC~~ SOLN: 0.0000 [IU] | Freq: Every day | SUBCUTANEOUS | Status: DC

## 2018-03-10 MED ORDER — ACETAMINOPHEN 325 MG PO TABS: 650.0000 mg | ORAL_TABLET | ORAL | Status: DC | PRN

## 2018-03-10 MED ORDER — DILTIAZEM HCL-DEXTROSE 100-5 MG/100ML-% IV SOLN (PREMIX)
5.0000 mg/h | INTRAVENOUS | Status: DC
  Filled 2018-03-10: qty 100

## 2018-03-10 MED ORDER — TRAMADOL HCL 50 MG PO TABS: 25.0000 mg | ORAL_TABLET | Freq: Two times a day (BID) | ORAL | Status: DC | PRN

## 2018-03-10 MED ORDER — METOPROLOL TARTRATE 5 MG/5ML IV SOLN: 5.0000 mg | Freq: Once | INTRAVENOUS | Status: DC

## 2018-03-10 MED ORDER — DILTIAZEM HCL ER COATED BEADS 180 MG PO CP24: 180.0000 mg | ORAL_CAPSULE | Freq: Every day | ORAL | Status: DC

## 2018-03-10 MED ORDER — DILTIAZEM LOAD VIA INFUSION
10.0000 mg | Freq: Once | INTRAVENOUS | Status: AC
  Administered 2018-03-10: 10 mg via INTRAVENOUS
  Filled 2018-03-10: qty 10

## 2018-03-10 MED ORDER — ONDANSETRON HCL 4 MG/2ML IJ SOLN: 4.0000 mg | Freq: Four times a day (QID) | INTRAMUSCULAR | Status: DC | PRN

## 2018-03-10 MED ORDER — GUAIFENESIN ER 600 MG PO TB12
600.0000 mg | ORAL_TABLET | Freq: Two times a day (BID) | ORAL | Status: DC
  Administered 2018-03-10 – 2018-03-11 (×2): 600 mg via ORAL
  Filled 2018-03-10 (×2): qty 1

## 2018-03-10 MED ORDER — ZOLPIDEM TARTRATE 5 MG PO TABS: 2.5000 mg | ORAL_TABLET | Freq: Every evening | ORAL | Status: DC | PRN

## 2018-03-10 MED ORDER — MELATONIN 3 MG PO TABS
3.0000 mg | ORAL_TABLET | Freq: Every day | ORAL | Status: DC
  Administered 2018-03-10: 3 mg via ORAL
  Filled 2018-03-10: qty 1

## 2018-03-10 MED ORDER — HEPARIN BOLUS VIA INFUSION
4000.0000 [IU] | Freq: Once | INTRAVENOUS | Status: AC
  Administered 2018-03-10: 4000 [IU] via INTRAVENOUS
  Filled 2018-03-10: qty 4000

## 2018-03-10 MED ORDER — APIXABAN 2.5 MG PO TABS
2.5000 mg | ORAL_TABLET | Freq: Two times a day (BID) | ORAL | Status: DC
  Administered 2018-03-10 – 2018-03-11 (×2): 2.5 mg via ORAL
  Filled 2018-03-10 (×2): qty 1

## 2018-03-10 MED ORDER — HEPARIN (PORCINE) 25000 UT/250ML-% IV SOLN
950.0000 [IU]/h | INTRAVENOUS | Status: DC
  Administered 2018-03-10: 950 [IU]/h via INTRAVENOUS
  Filled 2018-03-10: qty 250

## 2018-03-10 NOTE — Progress Notes (Signed)
ANTICOAGULATION CONSULT NOTE - Initial Consult  Pharmacy Consult for heparin to Eliquis Indication: atrial fibrillation  Allergies  Allergen Reactions  . Colchicine Anaphylaxis  . Hibiclens [Chlorhexidine] Hives  . Atorvastatin Other (See Comments)    Joint pain   . Ceftin Diarrhea  . Celebrex [Celecoxib] Swelling  . Codeine Hives  . Erythromycin Diarrhea  . Ezetimibe Other (See Comments)    Joint pain   . Iodinated Diagnostic Agents Nausea And Vomiting and Rash  . Oxycodone-Acetaminophen Hives  . Rosuvastatin Other (See Comments)    Joint pain   . Simvastatin Other (See Comments)    Joint pain   . Cefpodoxime Rash  . Cefuroxime Axetil Rash  . Nitroglycerin Other (See Comments)    Lowers patient's B/P  . Tetanus Toxoid Rash  . Vantin Other (See Comments)    Unknown/not noted on MAR??    Patient Measurements: Height: 5\' 10"  (177.8 cm) Weight: 153 lb 4.8 oz (69.5 kg) IBW/kg (Calculated) : 73 Heparin Dosing Weight: 68kg  Vital Signs: Temp: 98.1 F (36.7 C) (02/27 1408) Temp Source: Oral (02/27 1408) BP: 146/79 (02/27 1408) Pulse Rate: 70 (02/27 1408)  Labs: Recent Labs    03/10/18 0401 03/10/18 1148 03/10/18 1543  HGB 9.3*  --   --   HCT 30.5*  --   --   PLT 197  --   --   LABPROT 13.7  --   --   INR 1.1  --   --   CREATININE 1.54*  --   --   TROPONINI  --  0.31* 0.29*    Estimated Creatinine Clearance: 35.1 mL/min (A) (by C-G formula based on SCr of 1.54 mg/dL (H)).   Medical History: Past Medical History:  Diagnosis Date  . Allergic rhinitis   . Arthritis    arthritis-kyphosis  . Asthmatic bronchitis   . Chronic kidney disease   . DM type 2 (diabetes mellitus, type 2) ([place])   . DVT, lower extremity, proximal, acute ([place])   . Dyslipidemia   . Dysrhythmia    '97 tachycardia- no recent issues  . GERD (gastroesophageal reflux disease)   . Hearing impaired    bilateral hearing aids  . History of kidney stones   . History of skin cancer   .  Hypertension   . Hypothyroidism   . Macular degeneration    injections done bilaterally recent - 08-22-13  . Prostate cancer [facility])    Prostate cancer-radiation only,skin cancer-basal cell and last squamous cell right eye  . Stroke ([place])   . Urgency-frequency syndrome   . Urticaria    Assessment: 85 year old male admitted this am for afib with rvr. He does not appear to currently be on any anticoagulation, afib appears to be new. Does have history of DVT in past and was on apixaban.   Hgb 9.3 on admit (baseline likely around 10-11), plt wnl. New orders to start IV heparin.   PM: now transitioning to Eliquis, age > 24, Scr> 1.5, Weight > 60 kg  Goal of Therapy:  Monitor platelets by anticoagulation protocol: Yes   Plan:  Stop heparin Eliquis 2.5 mg po BID  Thank you Anette Guarneri, PharmD [phone] 03/10/2018 5:36 PM

## 2018-03-10 NOTE — Consult Note (Addendum)
Cardiology Consultation:   Patient ID: [name]; [id number]; 08-Dec-1931   Admit date: 03/10/2018 Date of Consult: 03/10/2018  Primary Care Provider: Lajean Manes, MD Primary Cardiologist: Fransico Him, MD   Patient Profile:   [name] is a 85 y.o. male with a hx of NSTEMI (07/2017), DM2, HTN, HLD, CKD stage II, CVA, prostate cancer s/p radiation, hypothyroidism and atrial fibrillation not on anticoagulation who is being seen today for the evaluation of AF with RVR with chest pain at the request of Dr. Lorin Mercy.   History of Present Illness:   Mr. [name] is an 85yo with a hx as stated as above who presented to [facility] on 03/10/2018 with complaints of mid-sternal chest pain found to be in AF with RVR. He reports that he got up overnight to go to the bathroom and began having chest pain and was concerned that he would not make it back to bed. He states that it was more of a pressure without associated N/V, SOB or diaphoresis. He does state that he could feel his heart "pounding" as well. He continues to live at an independent living facility. Given his symptoms, he called EMS for transport to the ED for further evaluation. He was given IV lopressor as well as 741ml bolus of IVF with BP improvement, however his rate remained elevated but improved. After the administration of IV lopressor his pain resolved. He reports similar chest pressure symptoms with his prior MI although he tells me that he continues to ride his recumbent bike daily for approximately 15-90 minutes daily without anginal symptoms. He states that prior to yesterday evening, he was in his usual state of health without SOB, dizziness, recent illness with fever, chills or cough. No LE swelling or orthopnea symptoms. He states that he has a trip planned to [facility] on Sunday by himself. He has a sister who lives near him and a son in [place] and a daughter in [place]. He lost his wife last year and is planning to take a trip to get away  for a bit.    In the ED, EKG with AF with RVR and no acute changes. Troponin level found to be elevated at 0.31 however states that his anginal symptoms resolved after his HR has stabilized. His creatinine is mildly elevated above his baseline. CXR with no active cardiopulmonary disease.   Of note, he was last seen in the office on 10/19/2017 for CAD follow up. At that time, he has recently started living at an ALF after his wife of 62 years passed in 02/2017. He was noted to be ambulating with a cane and/or walker and was using a recumbent bike approximately 30 minutes per day without exertional CP or dyspnea. When seen in July, he had an episode of sudden onset of deep substernal chest pain lasting approximately 30 minutes in duration with resolution after Maalox administration, thought to be GERD. His troponin levels were noted to elevated but flat with a peak of 0.18. EKG with SR with 1st degree AV block and PVC's. He underwent a Lexiscan stress test on 08/05/17 which revealed thinning of the inferior wall with moderate area of lateral wall ischemia at the mid and basal level with an EF of 58%. He was then scheduled for cardiac cath on 08/06/17 secondary to abnormal stress test. He underwent a DES to the [facility] with recommendations for DAPT with ASA 81 and Ticagrelor 90mg  twice daily for a minimum of 12 months. His echocardiogram showed  and LVEF of 55-60% with NWMA and G2DD. He has known statin intolerance and will be referred to lipid clinic for possible PCSK9 initiation  Unfortunately, he was seen again during a hospitalization 08/15/2017 before he could be seen for follow up in the office. He was consulted for new onset PAF in the setting of recent ureter stent placement per Dr. Alinda Money. He spontaneously converted to NSR. Given the self limited nature of the PAF, anticoagulation was not recommended unless recurrence. His Cardizem was changed to long acting CD 240mg  daily and he was last in NSR at Cardiology  sign off. He was to follow closely in our office for TOC follow up on 08/30/17, however did not make that appointment secondary to transportation issues (coming from ALF).  At last office visit (10/2017) , he was feeling well with no complaints. EKG showed NSR with 1st degree AV block with no reports of palpitations. He was compliant with DAPT therapy with no bleeding concerns. Per chart review, he was placed on Diltiazem CD 240mg  PO daily during his last hospitalization for AF with RVR (08/2017), however it does not appear that this was ever prescribed at discharge. He remained Toprol-XL 25mg .   Cardiology has been consulted for recurrent AF.    Past Medical History:  Diagnosis Date  . Allergic rhinitis   . Arthritis    arthritis-kyphosis  . Asthmatic bronchitis   . Chronic kidney disease   . DM type 2 (diabetes mellitus, type 2) ([place])   . DVT, lower extremity, proximal, acute ([place])   . Dyslipidemia   . Dysrhythmia    '97 tachycardia- no recent issues  . GERD (gastroesophageal reflux disease)   . Hearing impaired    bilateral hearing aids  . History of kidney stones   . History of skin cancer   . Hypertension   . Hypothyroidism   . Macular degeneration    injections done bilaterally recent - 08-22-13  . Prostate cancer [facility])    Prostate cancer-radiation only,skin cancer-basal cell and last squamous cell right eye  . Stroke ([place])   . Urgency-frequency syndrome   . Urticaria     Past Surgical History:  Procedure Laterality Date  . BACK SURGERY     lumbar fracture  . cataract surgery Bilateral   . COLONOSCOPY W/ POLYPECTOMY     x2   . CORONARY STENT INTERVENTION N/A 08/06/2017   Procedure: CORONARY STENT INTERVENTION;  Surgeon: Jettie Booze, MD;  Location: [place] [facility];  Service: Cardiovascular;  Laterality: N/A;  . CYSTOSCOPY W/ URETERAL STENT PLACEMENT Right 08/13/2017   Procedure: CYSTOSCOPY WITH RIGHT  RETROGRADE PYELOGRAM/URETERAL STENT PLACEMENT;  Surgeon:  Raynelle Bring, MD;  Location: [facility];  Service: Urology;  Laterality: Right;  . CYSTOSCOPY WITH INJECTION N/A 09/11/2013   Procedure: CYSTOSCOPY WITH BOTOX INJECTION INTO THE BLADDER;  Surgeon: Ailene Rud, MD;  Location: [facility];  Service: Urology;  Laterality: N/A;  . CYSTOSCOPY WITH RETROGRADE PYELOGRAM, URETEROSCOPY AND STENT PLACEMENT    . CYSTOSCOPY WITH STENT PLACEMENT Right 01/26/2016   Procedure: CYSTOSCOPY, RIGHT RETROGRADE WITH RIGHT URETERAL STENT PLACEMENT;  Surgeon: Cleon Gustin, MD;  Location: [facility];  Service: Urology;  Laterality: Right;  . CYSTOSCOPY/RETROGRADE/URETEROSCOPY Right 04/22/2016   Procedure: CYSTOSCOPY/RETROGRADE/ REMOVAL JJ STENT right insertion stent right insertion of foley;  Surgeon: Franchot Gallo, MD;  Location: [facility];  Service: Urology;  Laterality: Right;  . EXTRACORPOREAL SHOCK WAVE LITHOTRIPSY Right 02/27/2016   Procedure: RIGHT EXTRACORPOREAL SHOCK WAVE LITHOTRIPSY (ESWL)  GAITED;  Surgeon: Cleon Gustin, MD;  Location: [facility];  Service: Urology;  Laterality: Right;  . EXTRACORPOREAL SHOCK WAVE LITHOTRIPSY Right 02/10/2016   Procedure: EXTRACORPOREAL SHOCK WAVE LITHOTRIPSY (ESWL);  Surgeon: Kathie Rhodes, MD;  Location: [facility];  Service: Urology;  Laterality: Right;  [facility]-[phone] [id number] [employer]- [id number]   . EYE SURGERY     macular degeneration  . GALLBLADDER SURGERY  2006   no cholecystectomy  . HERNIA REPAIR    . LEFT HEART CATH AND CORONARY ANGIOGRAPHY N/A 08/06/2017   Procedure: LEFT HEART CATH AND CORONARY ANGIOGRAPHY;  Surgeon: Jettie Booze, MD;  Location: [place] [facility];  Service: Cardiovascular;  Laterality: N/A;  . LUNG REMOVAL, PARTIAL  age 45   for bronchiectasis  . PARTIAL THYMECTOMY  1985   for nodules  . POPLITEAL SYNOVIAL CYST EXCISION  2005  . TOTAL KNEE ARTHROPLASTY  2008   right  . TOTAL KNEE ARTHROPLASTY  2010   left     Prior to Admission medications     Medication Sig Start Date End Date Taking? Authorizing Provider  albuterol (PROAIR HFA) 108 (90 Base) MCG/ACT inhaler Inhale 2 puffs into the lungs every 8 (eight) hours as needed for shortness of breath.    Yes [provider]  alfuzosin (UROXATRAL) 10 MG 24 hr tablet Take 1 tablet (10 mg total) by mouth at bedtime. 02/10/16  Yes Kathie Rhodes, MD  Artificial Tear Ointment (LACRI-LUBE S.O.P. OP) Place 0.25 inches into both eyes at bedtime. LOWER EYELIDS   Yes [provider]  aspirin EC 81 MG tablet Take 81 mg by mouth daily.    Yes [provider]  brimonidine-timolol (COMBIGAN) 0.2-0.5 % ophthalmic solution Place 1 drop into both eyes 2 (two) times daily.   Yes [provider]  brinzolamide (AZOPT) 1 % ophthalmic suspension Place 1 drop into the right eye 3 (three) times daily.  09/03/16  Yes [provider]  carbidopa-levodopa (SINEMET IR) 25-100 MG tablet Take 1 tablet by mouth See admin instructions. Take 1 tablet by mouth three times a day- 8 AM, 2 PM, and 8 PM   Yes [provider]  Cholecalciferol (VITAMIN D-3) 5000 units TABS Take 5,000 Units by mouth daily.   Yes [provider]  colesevelam (WELCHOL) 625 MG tablet Take 1,875 mg by mouth daily.    Yes [provider]  docusate sodium (COLACE) 100 MG capsule Take 100 mg by mouth 2 (two) times daily.    Yes [provider]  gabapentin (NEURONTIN) 100 MG capsule Take 100 mg by mouth at bedtime.   Yes [provider]  guaiFENesin (MUCINEX) 600 MG 12 hr tablet Take 600 mg by mouth 2 (two) times daily.    Yes [provider]  hydroxypropyl methylcellulose / hypromellose (ISOPTO TEARS / GONIOVISC) 2.5 % ophthalmic solution Place 1 drop into both eyes 4 (four) times daily.   Yes [provider]  latanoprost (XALATAN) 0.005 % ophthalmic solution Place 1 drop into the right eye at bedtime.  07/28/16  Yes [provider]  levothyroxine  (SYNTHROID, LEVOTHROID) 175 MCG tablet Take 175 mcg by mouth daily before breakfast.   Yes [provider]  Melatonin 3 MG TABS Take 3 mg by mouth at bedtime.   Yes [provider]  Meth-Hyo-M Bl-Na Phos-Ph Sal (URIBEL) 118 MG CAPS Take 118 mg by mouth 3 (three) times daily as needed (BURNING).   Yes [provider]  metoprolol succinate (TOPROL-XL)  25 MG 24 hr tablet Take 1 tablet (25 mg total) by mouth daily. Patient taking differently: Take 25 mg by mouth every other day.  08/08/17  Yes Bonnielee Haff, MD  mirabegron ER (MYRBETRIQ) 50 MG TB24 tablet Take 1 tablet (50 mg total) by mouth daily. 01/28/16  Yes McKenzie, Candee Furbish, MD  Multiple Vitamins-Minerals (PRESERVISION/LUTEIN) CAPS Take 1 capsule by mouth 2 (two) times daily.   Yes [provider]  omeprazole (PRILOSEC) 40 MG capsule Take 40 mg by mouth daily.   Yes [provider]  polyethylene glycol (MIRALAX / GLYCOLAX) packet Take 17 g by mouth daily. [place]   Yes [provider]  potassium chloride (K-DUR,KLOR-CON) 10 MEQ tablet Take 10 mEq by mouth daily.   Yes [provider]  sitaGLIPtin (JANUVIA) 50 MG tablet Take 1 tablet (50 mg total) by mouth daily. 10/25/15  Yes Tat, Shanon Brow, MD  ticagrelor (BRILINTA) 90 MG TABS tablet Take 1 tablet (90 mg total) by mouth 2 (two) times daily. 08/08/17  Yes Bonnielee Haff, MD  traMADol (ULTRAM) 50 MG tablet Take 25 mg by mouth 2 (two) times daily as needed (for pain).    Yes [provider]  zolpidem (AMBIEN) 5 MG tablet Take 0.5 tablets (2.5 mg total) by mouth at bedtime as needed for sleep. 08/08/17  Yes Bonnielee Haff, MD    Inpatient Medications: Scheduled Meds: . alfuzosin  10 mg Oral QHS  . brimonidine  1 drop Both Eyes BID   And  . timolol  1 drop Both Eyes BID  . brinzolamide  1 drop Right Eye TID  . carbidopa-levodopa  1 tablet Oral 3 times per day  . colesevelam  1,875 mg Oral Daily  . docusate sodium  100  mg Oral BID  . gabapentin  100 mg Oral QHS  . guaiFENesin  600 mg Oral BID  . insulin aspart  0-15 Units Subcutaneous TID WC  . insulin aspart  0-5 Units Subcutaneous QHS  . latanoprost  1 drop Right Eye QHS  . [START ON 03/11/2018] levothyroxine  175 mcg Oral QAC breakfast  . Melatonin  3 mg Oral QHS  . mirabegron ER  50 mg Oral Daily  . pantoprazole  40 mg Oral Daily  . [START ON 03/11/2018] polyethylene glycol  17 g Oral Daily  . polyvinyl alcohol  1 drop Both Eyes QID   Continuous Infusions: . diltiazem (CARDIZEM) infusion    . heparin 950 Units/hr (03/10/18 1053)   PRN Meds: acetaminophen, ondansetron (ZOFRAN) IV, traMADol, zolpidem  Allergies:    Allergies  Allergen Reactions  . Colchicine Anaphylaxis  . Hibiclens [Chlorhexidine] Hives  . Atorvastatin Other (See Comments)    Joint pain   . Ceftin Diarrhea  . Celebrex [Celecoxib] Swelling  . Codeine Hives  . Erythromycin Diarrhea  . Ezetimibe Other (See Comments)    Joint pain   . Iodinated Diagnostic Agents Nausea And Vomiting and Rash  . Oxycodone-Acetaminophen Hives  . Rosuvastatin Other (See Comments)    Joint pain   . Simvastatin Other (See Comments)    Joint pain   . Cefpodoxime Rash  . Cefuroxime Axetil Rash  . Nitroglycerin Other (See Comments)    Lowers patient's B/P  . Tetanus Toxoid Rash  . Vantin Other (See Comments)    Unknown/not noted on MAR??    Social History:   Social History   Socioeconomic History  . Marital status: Married    Spouse name: Not on file  .  Number of children: 2  . Years of education: Not on file  . Highest education level: Not on file  Occupational History  . Occupation: retired  Scientific laboratory technician  . Financial resource strain: Not on file  . Food insecurity:    Worry: Not on file    Inability: Not on file  . Transportation needs:    Medical: Not on file    Non-medical: Not on file  Tobacco Use  . Smoking status: Never Smoker  . Smokeless tobacco: Never Used    Substance and Sexual Activity  . Alcohol use: No  . Drug use: No  . Sexual activity: Not Currently  Lifestyle  . Physical activity:    Days per week: Not on file    Minutes per session: Not on file  . Stress: Not on file  Relationships  . Social connections:    Talks on phone: Not on file    Gets together: Not on file    Attends religious service: Not on file    Active member of club or organization: Not on file    Attends meetings of clubs or organizations: Not on file    Relationship status: Not on file  . Intimate partner violence:    Fear of current or ex partner: Not on file    Emotionally abused: Not on file    Physically abused: Not on file    Forced sexual activity: Not on file  Other Topics Concern  . Not on file  Social History Narrative   MUST HAVE MEDS LISTED DO NOT SUBSTITUTE GENERICS PATIENT IS ALLERGIC TO DYES    Family History:   Family History  Problem Relation Age of Onset  . Other Sister        ophth disease  . Heart attack Father        x7  . Heart disease Father        MI x7  . Hypertension Mother   . Stroke Mother 29  . Amblyopia Neg Hx   . Blindness Neg Hx   . Glaucoma Neg Hx   . Macular degeneration Neg Hx   . Retinal detachment Neg Hx   . Strabismus Neg Hx   . Retinitis pigmentosa Neg Hx   . Cataracts Neg Hx    Family Status:  Family Status  Relation Name Status  . Sister  (Not Specified)       Ophth disease  . Father  Deceased       MI  . Mother  Deceased       stroke  . Neg Hx  (Not Specified)    ROS:  Please see the history of present illness.  All other ROS reviewed and negative.     Physical Exam/Data:   Vitals:   03/10/18 0630 03/10/18 0653 03/10/18 1000 03/10/18 1242  BP: 118/77 130/81  (!) 148/80  Pulse: 73 75  76  Resp: 10   20  Temp:  97.7 F (36.5 C)  97.8 F (36.6 C)  TempSrc:  Oral  Oral  SpO2: 98% 100%  100%  Weight:   69.5 kg   Height:   5\' 10"  (1.778 m)    No intake or output data in the 24 hours  ending 03/10/18 1325 Filed Weights   03/10/18 1000  Weight: 69.5 kg   Body mass index is 22 kg/m.   General: Elderly, NAD Skin: Warm, dry, intact  Head: Normocephalic, atraumatic, clear, moist mucus membranes. Neck: Negative for carotid bruits.  No JVD Lungs:Clear to ausculation bilaterally. No wheezes, rales, or rhonchi. Breathing is unlabored. Cardiovascular: Irregularly irregular with S1 S2. No murmurs, rubs, gallops, or LV heave appreciated. Abdomen: Soft, non-tender, non-distended with normoactive bowel sounds. No obvious abdominal masses. MSK: Strength and tone appear normal for age. 5/5 in all extremities Extremities: No edema. No clubbing or cyanosis. DP/PT pulses 2+ bilaterally Neuro: Alert and oriented. No focal deficits. No facial asymmetry. MAE spontaneously. Psych: Responds to questions appropriately with normal affect.     EKG:  The EKG was personally reviewed and demonstrates: 03/10/2018 AF with RVR Telemetry:  Telemetry was personally reviewed and demonstrates: 03/10/2018 AF with HR 70-90's   Relevant CV Studies:  Cardiac cath 08/06/2017 Conclusion     Mid RCA to Dist RCA lesion is 25% stenosed.  Ost 1st Mrg to 1st Mrg lesion is 70% stenosed.  Mid Cx lesion is 80% stenosed.  A drug-eluting stent was successfully placed using a STENT SYNERGY DES 2.25X20.  Post intervention, there is a 0% residual stenosis.  Mid LAD lesion is 25% stenosed.  LV end diastolic pressure is normal.  There is no aortic valve stenosis.   Recommend uninterrupted dual antiplatelet therapy with Aspirin 81mg  daily and Ticagrelor 90mg  twice dailyfor a minimum of 12 months (ACS - Class I recommendation).   2D echo Study Conclusions  - Left ventricle: The cavity size was normal. There was mild focal basal hypertrophy of the septum. Systolic function was normal. The estimated ejection fraction was in the range of 55% to 60%. Wall motion was normal; there were no  regional wall motion abnormalities. Features are consistent with a pseudonormal left ventricular filling pattern, with concomitant abnormal relaxation and increased filling pressure (grade 2 diastolic dysfunction). - Mitral valve: Calcified annulus. Mildly thickened, mildly calcified leaflets . Mild diffuse thickening of the anterior leaflet, consistent with rheumatic disease. - Left atrium: The atrium was moderately dilated. - Atrial septum: No defect or patent foramen ovale was identified.  Laboratory Data:  Chemistry Recent Labs  Lab 03/10/18 0401  NA 139  K 3.7  CL 110  CO2 21*  GLUCOSE 113*  BUN 29*  CREATININE 1.54*  CALCIUM 8.3*  GFRNONAA 41*  GFRAA 47*  ANIONGAP 8    Total Protein  Date Value Ref Range Status  03/10/2018 5.7 (L) 6.5 - 8.1 g/dL Final   Albumin  Date Value Ref Range Status  03/10/2018 3.2 (L) 3.5 - 5.0 g/dL Final   AST  Date Value Ref Range Status  03/10/2018 6 (L) 15 - 41 U/L Final   ALT  Date Value Ref Range Status  03/10/2018 <5 0 - 44 U/L Final   Alkaline Phosphatase  Date Value Ref Range Status  03/10/2018 67 38 - 126 U/L Final   Total Bilirubin  Date Value Ref Range Status  03/10/2018 0.9 0.3 - 1.2 mg/dL Final   Hematology Recent Labs  Lab 03/10/18 0401  WBC 6.0  RBC 3.36*  HGB 9.3*  HCT 30.5*  MCV 90.8  MCH 27.7  MCHC 30.5  RDW 12.9  PLT 197   Cardiac EnzymesNo results for input(s): TROPONINI in the last 168 hours.  Recent Labs  Lab 03/10/18 0408  TROPIPOC 0.00    BNPNo results for input(s): BNP, PROBNP in the last 168 hours.  DDimer No results for input(s): DDIMER in the last 168 hours. TSH:  Lab Results  Component Value Date   TSH 0.609 08/14/2017   Lipids: Lab Results  Component Value Date  CHOL 148 10/20/2017   HDL 46 10/20/2017   LDLCALC 90 10/20/2017   LDLDIRECT 153.9 04/02/2011   TRIG 58 10/20/2017   CHOLHDL 3.9 08/04/2017   HgbA1c: Lab Results  Component Value Date   HGBA1C 6.3  (H) 08/04/2017    Radiology/Studies:  Dg Chest Port 1 View  Result Date: 03/10/2018 CLINICAL DATA:  85 year old male with chest pain. EXAM: PORTABLE CHEST 1 VIEW COMPARISON:  Chest radiograph dated 08/13/2017 FINDINGS: Left lung base atelectatic changes/scarring. No lobar consolidation, or pleural effusion. No pneumothorax. Linear lucency along the right lateral chest wall, likely artifactual and related to skin fold. There is mild cardiomegaly. Atherosclerotic calcification of the aortic arch. No acute osseous pathology. Degenerative changes of the left shoulder. IMPRESSION: No active disease. Electronically Signed   By: Anner Crete M.D.   On: 03/10/2018 06:54   Assessment and Plan:   1. Paroxysmal atrial fibrillation: -Pt has one short episode of PAF after recent ureter stent placement in 08/2017 with spontaneous conversion to NSR with no indication for anticoagulation>>>presents today with recurrent AF with RVR  -Was started on Diltiazem CD 240mg  on last hospitalization (08/2017) however it does not appear that this was ever prescribed at discharge and he was noted to be stable without restart   -Will start home dose Toprol-XL 25mg  twice daily for rate control, monitor BP closely  -Continue IV Diltiazem for now and transition to PO possible tomorrow   -IV heparin for [facility] for now  -EKG today with AF>>telelmetry with AF with rates in the 70-80's  -Trop elevated at 0.31>>contrinue to trend but likely in the setting demand ischemia secondary rapid HR>>>denies recurrent chest pain  -Will check TSH level   -Given elevated CHADsVas score, will need AC for stroke risk reduction -May consider DCCV in the OP setting in 4 weeks after adequate AC  -CHA2DS2VASc =8 (age, CHF, HTN, CVA, prior MI, DM)  2. CAD with hx of NSTEMI and PCI/DES to [facility] 07/2017: -Denies recurrent chest pain or other ACS symptoms since admission after rate has been controlled  -Continue with ASA, Brilinta 90mg  twice daily  (needs until 07/2018)>>>made need to make further adjustments to therapy given need for [facility] prior to D/C -Continue home dose Toprol-XL 25mg  daily  -No statin secondary to intolerance>>lipid clinic referral   3. Chronic diastolic CHF: -Per most recent echocardiogram with LVEF of 55-60% with NWMA and G2DD  -Does not appears to be fluid volume overloaded today, stable  -Monitor symptoms closely given AF with RVR  3. HTN; -Elevated, 146/79>148/80>130/81 -Will add Toprol to regimen   4. HLD: -LDL at 90mg /dl on 10/20/2017, goal 70mg /dl -Hx of statin intolerance with potential for PCSK9 with lipid clinic  5. DM2: -Controlled, HbA21c 6.3  -SSI for glucose control while inpatient status    6. Acute on chronic kidney disease stage II: -Creatinine, 1.54 today with a baseline of 1.2-1.6 -Continue to avoid nephrotoxic medications   For questions or updates, please contact [facility] Please consult [email or web address] for contact info under Cardiology/STEMI.   SignedKathyrn Drown NP-C [facility] Pager: [phone] 03/10/2018 1:25 PM   The patient was seen, examined and discussed with Kathyrn Drown, NP  and I agree with the above.   85 y.o. male with a hx of NSTEMI (07/2017, status post drug-eluting Synergy stent placement today with left circumflex artery, currently on Brilinta and aspirin), DM2, HTN, HLD, CKD stage II, CVA, prostate cancer s/p radiation, hypothyroidism and atrial fibrillation not on anticoagulation (1 episode - short during  surgery) who is being seen today for the evaluation of AF with RVR with chest pain.  He presented with chest pain and was found to be in A. fib with RVR the first troponin  0.31, he was started on IV Cardizem and IV fluids with spontaneous cardioversion into normal sinus rhythm this afternoon with resolution of his symptoms.  On physical exam he is very pleasant, not in acute distress, no JVDs, he has regular rate and rhythm, his lungs are clear, his  extremities have no edema they are warm and they have good peripheral pulses.  Assessment and plan:  CAD, status post DES placement to mid circumflex artery in July 2019 for non-STEMI Paroxysmal atrial fibrillation with RVR Elevated troponin -believed to be demand ischemia in the settings of A. fib with RVR Hypertension Hyperlipidemia  Plan:  We will switch IV Cardizem to p.o. Cardizem CD 180 mg daily starting tonight and discontinue IV infusion 2 hours after starting p.o. Cardizem Continue Toprol-XL 12.5 mg daily Start patient on Eliquis 2.5 mg p.o. twice daily, will discontinue aspirin and Brilinta and start Plavix 75 mg daily Anticipated discharge tomorrow if he remains in sinus rhythm.  Ena Dawley, MD 03/10/2018

## 2018-03-10 NOTE — ED Notes (Signed)
Attempted to call report

## 2018-03-10 NOTE — Progress Notes (Signed)
ANTICOAGULATION CONSULT NOTE - Initial Consult  Pharmacy Consult for heparin  Indication: atrial fibrillation  Allergies  Allergen Reactions  . Colchicine Anaphylaxis  . Hibiclens [Chlorhexidine] Hives  . Atorvastatin Other (See Comments)    Joint pain   . Ceftin Diarrhea  . Celebrex [Celecoxib] Swelling  . Codeine Hives  . Erythromycin Diarrhea  . Ezetimibe Other (See Comments)    Joint pain   . Iodinated Diagnostic Agents Nausea And Vomiting and Rash  . Oxycodone-Acetaminophen Hives  . Rosuvastatin Other (See Comments)    Joint pain   . Simvastatin Other (See Comments)    Joint pain   . Cefpodoxime Rash  . Cefuroxime Axetil Rash  . Nitroglycerin Other (See Comments)    Lowers patient's B/P  . Tetanus Toxoid Rash  . Vantin Other (See Comments)    Unknown/not noted on MAR??    Patient Measurements:   Heparin Dosing Weight: 68kg  Vital Signs: Temp: 97.7 F (36.5 C) (02/27 0653) Temp Source: Oral (02/27 0653) BP: 130/81 (02/27 0653) Pulse Rate: 75 (02/27 0653)  Labs: Recent Labs    03/10/18 0401  HGB 9.3*  HCT 30.5*  PLT 197  LABPROT 13.7  INR 1.1  CREATININE 1.54*    CrCl cannot be calculated (Unknown ideal weight.).   Medical History: Past Medical History:  Diagnosis Date  . Allergic rhinitis   . Arthritis    arthritis-kyphosis  . Asthmatic bronchitis   . Chronic kidney disease   . DM type 2 (diabetes mellitus, type 2) ([place])   . DVT, lower extremity, proximal, acute ([place])   . Dyslipidemia   . Dysrhythmia    '97 tachycardia- no recent issues  . GERD (gastroesophageal reflux disease)   . Hearing impaired    bilateral hearing aids  . History of kidney stones   . History of skin cancer   . Hypertension   . Hypothyroidism   . Macular degeneration    injections done bilaterally recent - 08-22-13  . Prostate cancer [facility])    Prostate cancer-radiation only,skin cancer-basal cell and last squamous cell right eye  . Stroke ([place])   .  Urgency-frequency syndrome   . Urticaria    Assessment: 85 year old male admitted this am for afib with rvr. He does not appear to currently be on any anticoagulation, afib appears to be new. Does have history of DVT in past and was on apixaban.   Hgb 9.3 on admit (baseline likely around 10-11), plt wnl. New orders to start IV heparin.   Goal of Therapy:  Heparin level 0.3-0.7 units/ml Monitor platelets by anticoagulation protocol: Yes   Plan:  Give 4000 units bolus x 1 Start heparin infusion at 950 units/hr Check anti-Xa level in 8 hours and daily while on heparin Continue to monitor H&H and platelets  Erin Hearing PharmD., BCPS Clinical Pharmacist 03/10/2018 9:51 AM

## 2018-03-10 NOTE — ED Triage Notes (Signed)
Pt  Here from a nursing home with c/o chest pain hypotension and afib rvr , , 5mg  of lopressor and 750 total of fluid given

## 2018-03-10 NOTE — ED Provider Notes (Signed)
[facility] EMERGENCY DEPARTMENT Provider Note  CSN: [id number] Arrival date & time: 03/10/18 1660  Chief Complaint(s) Atrial Fibrillation  HPI [name] is a 85 y.o. male with extensive past medical history listed below including atrial fibrillation (chads vas score of 7) on metoprolol, not anticoagulated who presents to the emergency department with A. fib RVR.  Patient currently has a rate of 150s and is asymptomatic.  He did report that 2 hours ago he had chest tightness which has since resolved.  No associated shortness of breath, nausea, emesis, abdominal pain.  No recent fevers or infections.  Denies any physical complaints at this time.  EMS noted patient was hypotensive.  He was given 750 cc of IV fluids and 5 mg of Lopressor.   HPI  Past Medical History Past Medical History:  Diagnosis Date  . Allergic rhinitis   . Arthritis    arthritis-kyphosis  . Asthmatic bronchitis   . Chronic kidney disease   . DM type 2 (diabetes mellitus, type 2) ([place])   . DVT, lower extremity, proximal, acute ([place])   . Dyslipidemia   . Dysrhythmia    '97 tachycardia- no recent issues  . GERD (gastroesophageal reflux disease)   . Hearing impaired    bilateral hearing aids  . History of kidney stones   . History of skin cancer   . Hypertension   . Hypothyroidism   . Macular degeneration    injections done bilaterally recent - 08-22-13  . Prostate cancer [facility])    Prostate cancer-radiation only,skin cancer-basal cell and last squamous cell right eye  . Stroke ([place])   . Urgency-frequency syndrome   . Urticaria    Patient Active Problem List   Diagnosis Date Noted  . Coronary artery disease involving native coronary artery of native heart without angina pectoris   . Hyperlipidemia LDL goal <70   . Sepsis ([place]) 08/14/2017  . Atrial fibrillation with RVR ([place]) 08/14/2017  . Hydronephrosis 08/13/2017  . Hydroureteronephrosis   . Right ureteral stone   . Urinary  tract infection with hematuria   . Non-ST elevation (NSTEMI) myocardial infarction ([place])   . GERD (gastroesophageal reflux disease) 08/04/2017  . Elevated troponin 08/03/2017  . Chest pain 08/03/2017  . CKD (chronic kidney disease), stage III ([place]) 08/03/2017  . Type II diabetes mellitus with renal manifestations ([place]) 08/03/2017  . Pseudophakia of both eyes 05/31/2017  . Retained ureteral stent 04/22/2016  . Ureteral calculus 01/25/2016  . Mental status change 10/24/2015  . Encephalopathy 10/24/2015  . Hypothermia 10/24/2015  . Chronic renal insufficiency 10/24/2015  . Benign essential HTN 10/24/2015  . Hypoglycemia 10/24/2015  . Acute encephalopathy 10/24/2015  . CVA (cerebral vascular accident) ([place]) 10/14/2014  . Prerenal azotemia 10/14/2014  . Mild HTN 10/14/2014  . CVA (cerebral infarction)   . TIA (transient ischemic attack)   . Diabetes mellitus type 2 without retinopathy ([place]) 03/27/2014  . CME (cystoid macular edema), left 08/10/2013  . PNAR (perennial non-allergic rhinitis) 05/23/2013  . Squamous cell carcinoma of eyelid 05/15/2013  . Squamous cell carcinoma 04/06/2013  . Blepharitis 03/16/2013  . Eyelid lesion 03/16/2013  . Seasonal and perennial allergic rhinitis 06/11/2012  . Posterior vitreous detachment of right eye 10/23/2011  . Exudative age-related macular degeneration ([place]) 07/28/2011  . Nonproliferative diabetic retinopathy ([place]) 04/07/2011  . Acute bronchitis due to infection 01/14/2011  . Nondiabetic proliferative retinopathy 10/17/2010  . Insomnia, unspecified 05/19/2010  . ATAXIA 03/17/2010  . DIZZINESS 03/06/2010  . Chronic constipation  02/21/2008  . OSTEOARTHRITIS, KNEE, SEVERE 01/12/2008  . HYPERLIPIDEMIA 01/17/2007  . Hypothyroidism (post surgical) 10/15/2006  . SKIN CANCER, HX OF 06/08/2006  . Diabetes mellitus, insulin dependent (IDDM), controlled ([place]) 04/22/2006  . Paroxysmal atrial fibrillation ([place])  CHADVASc = 5 04/22/2006  . URTICARIA  04/22/2006  . POPLITEAL CYST 04/22/2006  . THYROIDECTOMY, SUBTOTAL, HX OF 04/22/2006  . TOTAL KNEE REPLACEMENT, HX OF 04/22/2006   Home Medication(s) Prior to Admission medications   Medication Sig Start Date End Date Taking? Authorizing Provider  albuterol (PROAIR HFA) 108 (90 Base) MCG/ACT inhaler Inhale 2 puffs into the lungs every 8 (eight) hours as needed for shortness of breath.    Yes [provider]  alfuzosin (UROXATRAL) 10 MG 24 hr tablet Take 1 tablet (10 mg total) by mouth at bedtime. 02/10/16  Yes Kathie Rhodes, MD  Artificial Tear Ointment (LACRI-LUBE S.O.P. OP) Place 0.25 inches into both eyes at bedtime. LOWER EYELIDS   Yes [provider]  aspirin EC 81 MG tablet Take 81 mg by mouth daily.    Yes [provider]  brimonidine-timolol (COMBIGAN) 0.2-0.5 % ophthalmic solution Place 1 drop into both eyes 2 (two) times daily.   Yes [provider]  brinzolamide (AZOPT) 1 % ophthalmic suspension Place 1 drop into the right eye 3 (three) times daily.  09/03/16  Yes [provider]  carbidopa-levodopa (SINEMET IR) 25-100 MG tablet Take 1 tablet by mouth See admin instructions. Take 1 tablet by mouth three times a day- 8 AM, 2 PM, and 8 PM   Yes [provider]  Cholecalciferol (VITAMIN D-3) 5000 units TABS Take 5,000 Units by mouth daily.   Yes [provider]  colesevelam (WELCHOL) 625 MG tablet Take 1,875 mg by mouth daily.    Yes [provider]  docusate sodium (COLACE) 100 MG capsule Take 100 mg by mouth 2 (two) times daily.    Yes [provider]  gabapentin (NEURONTIN) 100 MG capsule Take 100 mg by mouth at bedtime.   Yes [provider]  guaiFENesin (MUCINEX) 600 MG 12 hr tablet Take 600 mg by mouth 2 (two) times daily.    Yes [provider]  hydroxypropyl methylcellulose / hypromellose (ISOPTO TEARS / GONIOVISC) 2.5 % ophthalmic solution Place 1 drop into both eyes 4 (four) times  daily.   Yes [provider]  latanoprost (XALATAN) 0.005 % ophthalmic solution Place 1 drop into the right eye at bedtime.  07/28/16  Yes [provider]  levothyroxine (SYNTHROID, LEVOTHROID) 175 MCG tablet Take 175 mcg by mouth daily before breakfast.   Yes [provider]  Melatonin 3 MG TABS Take 3 mg by mouth at bedtime.   Yes [provider]  Meth-Hyo-M Bl-Na Phos-Ph Sal (URIBEL) 118 MG CAPS Take 118 mg by mouth 3 (three) times daily as needed (BURNING).   Yes [provider]  metoprolol succinate (TOPROL-XL) 25 MG 24 hr tablet Take 1 tablet (25 mg total) by mouth daily. Patient taking differently: Take 25 mg by mouth every other day.  08/08/17  Yes Bonnielee Haff, MD  mirabegron ER (MYRBETRIQ) 50 MG TB24 tablet Take 1 tablet (50 mg total) by mouth daily. 01/28/16  Yes McKenzie, Candee Furbish, MD  Multiple Vitamins-Minerals (PRESERVISION/LUTEIN) CAPS Take 1 capsule by mouth 2 (two) times daily.   Yes [provider]  omeprazole (PRILOSEC) 40 MG capsule Take 40 mg by mouth daily.   Yes [provider]  polyethylene glycol (MIRALAX / GLYCOLAX) packet  Take 17 g by mouth daily. [place]   Yes [provider]  potassium chloride (K-DUR,KLOR-CON) 10 MEQ tablet Take 10 mEq by mouth daily.   Yes [provider]  sitaGLIPtin (JANUVIA) 50 MG tablet Take 1 tablet (50 mg total) by mouth daily. 10/25/15  Yes Tat, Shanon Brow, MD  ticagrelor (BRILINTA) 90 MG TABS tablet Take 1 tablet (90 mg total) by mouth 2 (two) times daily. 08/08/17  Yes Bonnielee Haff, MD  traMADol (ULTRAM) 50 MG tablet Take 25 mg by mouth 2 (two) times daily as needed (for pain).    Yes [provider]  zolpidem (AMBIEN) 5 MG tablet Take 0.5 tablets (2.5 mg total) by mouth at bedtime as needed for sleep. 08/08/17  Yes Bonnielee Haff, MD  fexofenadine (ALLEGRA) 180 MG tablet Take 180 mg by mouth daily.    [provider]                                                                                                                                     Past Surgical History Past Surgical History:  Procedure Laterality Date  . BACK SURGERY     lumbar fracture  . cataract surgery Bilateral   . COLONOSCOPY W/ POLYPECTOMY     x2   . CORONARY STENT INTERVENTION N/A 08/06/2017   Procedure: CORONARY STENT INTERVENTION;  Surgeon: Jettie Booze, MD;  Location: [facility];  Service: Cardiovascular;  Laterality: N/A;  . CYSTOSCOPY W/ URETERAL STENT PLACEMENT Right 08/13/2017   Procedure: CYSTOSCOPY WITH RIGHT  RETROGRADE PYELOGRAM/URETERAL STENT PLACEMENT;  Surgeon: Raynelle Bring, MD;  Location: [facility];  Service: Urology;  Laterality: Right;  . CYSTOSCOPY WITH INJECTION N/A 09/11/2013   Procedure: CYSTOSCOPY WITH BOTOX INJECTION INTO THE BLADDER;  Surgeon: Ailene Rud, MD;  Location: [facility];  Service: Urology;  Laterality: N/A;  . CYSTOSCOPY WITH RETROGRADE PYELOGRAM, URETEROSCOPY AND STENT PLACEMENT    . CYSTOSCOPY WITH STENT PLACEMENT Right 01/26/2016   Procedure: CYSTOSCOPY, RIGHT RETROGRADE WITH RIGHT URETERAL STENT PLACEMENT;  Surgeon: Cleon Gustin, MD;  Location: [facility];  Service: Urology;  Laterality: Right;  . CYSTOSCOPY/RETROGRADE/URETEROSCOPY Right 04/22/2016   Procedure: CYSTOSCOPY/RETROGRADE/ REMOVAL JJ STENT right insertion stent right insertion of foley;  Surgeon: Franchot Gallo, MD;  Location: [facility];  Service: Urology;  Laterality: Right;  . EXTRACORPOREAL SHOCK WAVE LITHOTRIPSY Right 02/27/2016   Procedure: RIGHT EXTRACORPOREAL SHOCK WAVE LITHOTRIPSY (ESWL) GAITED;  Surgeon: Cleon Gustin, MD;  Location: [facility];  Service: Urology;  Laterality: Right;  . EXTRACORPOREAL SHOCK WAVE LITHOTRIPSY Right 02/10/2016   Procedure: EXTRACORPOREAL SHOCK WAVE LITHOTRIPSY (ESWL);  Surgeon: Kathie Rhodes, MD;  Location: [facility];  Service: Urology;  Laterality: Right;  [facility]  [phone] [id number] BCBS- [id number]   . EYE SURGERY     macular degeneration  . GALLBLADDER SURGERY  2006   no cholecystectomy  . HERNIA REPAIR    .  LEFT HEART CATH AND CORONARY ANGIOGRAPHY N/A 08/06/2017   Procedure: LEFT HEART CATH AND CORONARY ANGIOGRAPHY;  Surgeon: Jettie Booze, MD;  Location: [facility];  Service: Cardiovascular;  Laterality: N/A;  . LUNG REMOVAL, PARTIAL  age 89   for bronchiectasis  . PARTIAL THYMECTOMY  1985   for nodules  . POPLITEAL SYNOVIAL CYST EXCISION  2005  . TOTAL KNEE ARTHROPLASTY  2008   right  . TOTAL KNEE ARTHROPLASTY  2010   left   Family History Family History  Problem Relation Age of Onset  . Other Sister        ophth disease  . Heart attack Father        x7  . Heart disease Father        MI x7  . Hypertension Mother   . Stroke Mother 71  . Amblyopia Neg Hx   . Blindness Neg Hx   . Glaucoma Neg Hx   . Macular degeneration Neg Hx   . Retinal detachment Neg Hx   . Strabismus Neg Hx   . Retinitis pigmentosa Neg Hx   . Cataracts Neg Hx     Social History Social History   Tobacco Use  . Smoking status: Never Smoker  . Smokeless tobacco: Never Used  Substance Use Topics  . Alcohol use: No  . Drug use: No   Allergies Colchicine; Hibiclens [chlorhexidine]; Atorvastatin; Ceftin; Celebrex [celecoxib]; Codeine; Erythromycin; Ezetimibe; Iodinated diagnostic agents; Oxycodone-acetaminophen; Rosuvastatin; Simvastatin; Cefpodoxime; Cefuroxime axetil; Nitroglycerin; Tetanus toxoid; and Vantin  Review of Systems Review of Systems All other systems are reviewed and are negative for acute change except as noted in the HPI  Physical Exam Vital Signs  I have reviewed the triage vital signs BP 114/77 (BP Location: Right Arm)   Pulse 74   Temp 97.9 F (36.6 C) (Oral)   Resp 18   SpO2 98%   Physical Exam Vitals signs reviewed.  Constitutional:      General: He is not in acute distress.     Appearance: He is well-developed. He is not diaphoretic.  HENT:     Head: Normocephalic and atraumatic.     Nose: Nose normal.  Eyes:     General: No scleral icterus.       Right eye: No discharge.        Left eye: No discharge.     Conjunctiva/sclera: Conjunctivae normal.     Pupils: Pupils are equal, round, and reactive to light.  Neck:     Musculoskeletal: Normal range of motion and neck supple.  Cardiovascular:     Rate and Rhythm: Tachycardia present. Rhythm regularly irregular.     Heart sounds: No murmur. No friction rub. No gallop.   Pulmonary:     Effort: Pulmonary effort is normal. No respiratory distress.     Breath sounds: Normal breath sounds. No stridor. No rales.  Abdominal:     General: There is no distension.     Palpations: Abdomen is soft.     Tenderness: There is no abdominal tenderness.  Musculoskeletal:        General: No tenderness.  Skin:    General: Skin is warm and dry.     Findings: No erythema or rash.  Neurological:     Mental Status: He is alert and oriented to person, place, and time.     ED Results and Treatments Labs (all labs ordered are listed, but only abnormal results are displayed) Labs Reviewed  CBC WITH DIFFERENTIAL/PLATELET -  Abnormal; Notable for the following components:      Result Value   RBC 3.36 (*)    Hemoglobin 9.3 (*)    HCT 30.5 (*)    All other components within normal limits  COMPREHENSIVE METABOLIC PANEL - Abnormal; Notable for the following components:   CO2 21 (*)    Glucose, Bld 113 (*)    BUN 29 (*)    Creatinine, Ser 1.54 (*)    Calcium 8.3 (*)    Total Protein 5.7 (*)    Albumin 3.2 (*)    AST 6 (*)    GFR calc non Af Amer 41 (*)    GFR calc Af Amer 47 (*)    All other components within normal limits  PROTIME-INR  I-STAT TROPONIN, ED                                                                                                                         EKG  EKG Interpretation  Date/Time:  Thursday  March 10 2018 03:54:54 EST Ventricular Rate:  150 PR Interval:    QRS Duration: 85 QT Interval:  303 QTC Calculation: 479 R Axis:   41 Text Interpretation:  Atrial fibrillation with rapid V-rate Ventricular premature complex Anterior infarct, old ST depression, probably rate related NO STEMI. Confirmed by Addison Lank [phone]) on 03/10/2018 4:10:24 AM      Radiology No results found. Pertinent labs & imaging results that were available during my care of the patient were reviewed by me and considered in my medical decision making (see chart for details).  Medications Ordered in ED Medications  diltiazem (CARDIZEM) 1 mg/mL load via infusion 10 mg (10 mg Intravenous Bolus from Bag 03/10/18 0523)    And  diltiazem (CARDIZEM) 100 mg in dextrose 5% 127mL (1 mg/mL) infusion (5 mg/hr Intravenous New Bag/Given 03/10/18 0523)  sodium chloride 0.9 % bolus 1,000 mL (1,000 mLs Intravenous New Bag/Given 03/10/18 4193)                                                                                                                                    Procedures Procedures CRITICAL CARE Performed by: Grayce Sessions Laquinda Moller Total critical care time: 40 minutes Critical care time was exclusive of separately billable procedures and treating other patients. Critical care was necessary to treat or prevent imminent or life-threatening deterioration. Critical care was time spent personally by  me on the following activities: development of treatment plan with patient and/or surrogate as well as nursing, discussions with consultants, evaluation of patient's response to treatment, examination of patient, obtaining history from patient or surrogate, ordering and performing treatments and interventions, ordering and review of laboratory studies, ordering and review of radiographic studies, pulse oximetry and re-evaluation of patient's condition.   (including critical care time)  Medical Decision Making / ED  Course I have reviewed the nursing notes for this encounter and the patient's prior records (if available in EHR or on provided paperwork).    Patient in A. fib RVR.  Initial blood pressure with systolics in the 50D but improved to low 100s without intervention here.  Will obtain screening labs.  Patient will be given additional IV fluids and started on diltiazem bolus and drip, since time of onset cannot be determined and patient is not anticoagulated.  Patient responded well to diltiazem.  He is rate controlled.  Intermittent between normal sinus rhythm and atrial fibrillation.  Initially patient blood pressure dropped with a diltiazem bolus however it recovered quickly.  Will discuss case with medicine for admission and continued management.  Final Clinical Impression(s) / ED Diagnoses Final diagnoses:  Atrial fibrillation with RVR ([place])      This chart was dictated using voice recognition software.  Despite best efforts to proofread,  errors can occur which can change the documentation meaning.   Fatima Blank, MD 03/10/18 [phone]

## 2018-03-10 NOTE — H&P (Signed)
History and Physical    CAROLL CUNNINGTON XKG:[id number] DOB: 11-Jan-1932 DOA: 03/10/2018  PCP: Lajean Manes, MD Consultants:  Alinda Money - urology; [facility] - cardiology; [facility] - podiatry; Sharol Given - orthopedics Patient coming from:  Home - lives alone - his wife died 2022-03-21; NOK: Sister, [phone]   Chief Complaint: Rapid heart rate  HPI: [name] is a 85 y.o. male with medical history significant of CVA; prostate CA s/p radiation therapy; hypothyroidism; macular degeneration; HTN; HLD; DM; and afib not on AC presenting with afib with RVR.  He got overnight to go to the bathroom and noticed chest pain.  He was concerned he wouldn't make it back to bed.  Midsternal pain.  He does not think he got NTG en route.  EMS said his heart was beating too fast and they gave him something to slow his heart down and that made his pain go away.  This is the same thing that happened when he had a heart attack about a year ago.  His pain is resolved.  He had been feeling well prior, was preparing to go to [facility] on Sunday.  He rides a stationary bike for 15 minutes a day without chest pain.  He could feel the RVR while he was having the chest pain.  He thinks he was on Coumadin in the past once, but he is unsure why it was stopped.  He is not on AC at this time.    ED Course: Carryover, as per Dr. Myna Hidalgo:  12 yom with hx of PAF not on AC, CAD, CKD III, hypothyroidism presenting with chest pain and found to be in a fib with rates 160 and SBP 80's. Given Lopressor and 750 cc IVF bolus by EMS and BP improved but not rate. He has negative trop. Treated with diltiazem bolus and in ED and started on infusion. HR now 90's with SBP 110.   Review of Systems: As per HPI; otherwise review of systems reviewed and negative.   Ambulatory Status:  Ambulates without assistance or uses a scooter  Past Medical History:  Diagnosis Date  . Allergic rhinitis   . Arthritis    arthritis-kyphosis  . Asthmatic bronchitis   .  Chronic kidney disease   . DM type 2 (diabetes mellitus, type 2) ([place])   . DVT, lower extremity, proximal, acute ([place])   . Dyslipidemia   . Dysrhythmia    '97 tachycardia- no recent issues  . GERD (gastroesophageal reflux disease)   . Hearing impaired    bilateral hearing aids  . History of kidney stones   . History of skin cancer   . Hypertension   . Hypothyroidism   . Macular degeneration    injections done bilaterally recent - 08-22-13  . Prostate cancer [facility])    Prostate cancer-radiation only,skin cancer-basal cell and last squamous cell right eye  . Stroke ([place])   . Urgency-frequency syndrome   . Urticaria     Past Surgical History:  Procedure Laterality Date  . BACK SURGERY     lumbar fracture  . cataract surgery Bilateral   . COLONOSCOPY W/ POLYPECTOMY     x2   . CORONARY STENT INTERVENTION N/A 08/06/2017   Procedure: CORONARY STENT INTERVENTION;  Surgeon: Jettie Booze, MD;  Location: [facility];  Service: Cardiovascular;  Laterality: N/A;  . CYSTOSCOPY W/ URETERAL STENT PLACEMENT Right 08/13/2017   Procedure: CYSTOSCOPY WITH RIGHT  RETROGRADE PYELOGRAM/URETERAL STENT PLACEMENT;  Surgeon: Raynelle Bring, MD;  Location: [facility];  Service: Urology;  Laterality: Right;  . CYSTOSCOPY WITH INJECTION N/A 09/11/2013   Procedure: CYSTOSCOPY WITH BOTOX INJECTION INTO THE BLADDER;  Surgeon: Ailene Rud, MD;  Location: [facility];  Service: Urology;  Laterality: N/A;  . CYSTOSCOPY WITH RETROGRADE PYELOGRAM, URETEROSCOPY AND STENT PLACEMENT    . CYSTOSCOPY WITH STENT PLACEMENT Right 01/26/2016   Procedure: CYSTOSCOPY, RIGHT RETROGRADE WITH RIGHT URETERAL STENT PLACEMENT;  Surgeon: Cleon Gustin, MD;  [facility]: [facility];  Service: Urology;  Laterality: Right;  . CYSTOSCOPY/RETROGRADE/URETEROSCOPY Right 04/22/2016   Procedure: CYSTOSCOPY/RETROGRADE/ REMOVAL JJ STENT right insertion stent right insertion of foley;  Surgeon: Franchot Gallo, MD;  Location: [facility];   Service: Urology;  Laterality: Right;  . EXTRACORPOREAL SHOCK WAVE LITHOTRIPSY Right 02/27/2016   Procedure: RIGHT EXTRACORPOREAL SHOCK WAVE LITHOTRIPSY (ESWL) GAITED;  Surgeon: Cleon Gustin, MD;  [facility]: [facility];  Service: Urology;  Laterality: Right;  . EXTRACORPOREAL SHOCK WAVE LITHOTRIPSY Right 02/10/2016   Procedure: EXTRACORPOREAL SHOCK WAVE LITHOTRIPSY (ESWL);  Surgeon: Kathie Rhodes, MD;  Location: [facility];  Service: Urology;  Laterality: Right;  [phone] [phone] A [employer]- [id number]   . EYE SURGERY     macular degeneration  . GALLBLADDER SURGERY  2006   no cholecystectomy  . HERNIA REPAIR    . LEFT HEART CATH AND CORONARY ANGIOGRAPHY N/A 08/06/2017   Procedure: LEFT HEART CATH AND CORONARY ANGIOGRAPHY;  Surgeon: Jettie Booze, MD;  Location: [place] [facility];  Service: Cardiovascular;  Laterality: N/A;  . LUNG REMOVAL, PARTIAL  age 89   for bronchiectasis  . PARTIAL THYMECTOMY  1985   for nodules  . POPLITEAL SYNOVIAL CYST EXCISION  2005  . TOTAL KNEE ARTHROPLASTY  2008   right  . TOTAL KNEE ARTHROPLASTY  2010   left    Social History   Socioeconomic History  . Marital status: Married    Spouse name: Not on file  . Number of children: 2  . Years of education: Not on file  . Highest education level: Not on file  Occupational History  . Occupation: retired  Scientific laboratory technician  . Financial resource strain: Not on file  . Food insecurity:    Worry: Not on file    Inability: Not on file  . Transportation needs:    Medical: Not on file    Non-medical: Not on file  Tobacco Use  . Smoking status: Never Smoker  . Smokeless tobacco: Never Used  Substance and Sexual Activity  . Alcohol use: No  . Drug use: No  . Sexual activity: Not Currently  Lifestyle  . Physical activity:    Days per week: Not on file    Minutes per session: Not on file  . Stress: Not on file  Relationships  . Social connections:    Talks on phone: Not  on file    Gets together: Not on file    Attends religious service: Not on file    Active member of club or organization: Not on file    Attends meetings of clubs or organizations: Not on file    Relationship status: Not on file  . Intimate partner violence:    Fear of current or ex partner: Not on file    Emotionally abused: Not on file    Physically abused: Not on file    Forced sexual activity: Not on file  Other Topics Concern  . Not on file  Social History Narrative   MUST HAVE MEDS LISTED  DO NOT SUBSTITUTE GENERICS PATIENT IS ALLERGIC TO DYES    Allergies  Allergen Reactions  . Colchicine Anaphylaxis  . Hibiclens [Chlorhexidine] Hives  . Atorvastatin Other (See Comments)    Joint pain   . Ceftin Diarrhea  . Celebrex [Celecoxib] Swelling  . Codeine Hives  . Erythromycin Diarrhea  . Ezetimibe Other (See Comments)    Joint pain   . Iodinated Diagnostic Agents Nausea And Vomiting and Rash  . Oxycodone-Acetaminophen Hives  . Rosuvastatin Other (See Comments)    Joint pain   . Simvastatin Other (See Comments)    Joint pain   . Cefpodoxime Rash  . Cefuroxime Axetil Rash  . Nitroglycerin Other (See Comments)    Lowers patient's B/P  . Tetanus Toxoid Rash  . Vantin Other (See Comments)    Unknown/not noted on MAR??    Family History  Problem Relation Age of Onset  . Other Sister        ophth disease  . Heart attack Father        x7  . Heart disease Father        MI x7  . Hypertension Mother   . Stroke Mother 74  . Amblyopia Neg Hx   . Blindness Neg Hx   . Glaucoma Neg Hx   . Macular degeneration Neg Hx   . Retinal detachment Neg Hx   . Strabismus Neg Hx   . Retinitis pigmentosa Neg Hx   . Cataracts Neg Hx     Prior to Admission medications   Medication Sig Start Date End Date Taking? Authorizing Provider  albuterol (PROAIR HFA) 108 (90 Base) MCG/ACT inhaler Inhale 2 puffs into the lungs every 8 (eight) hours as needed for shortness of breath.    Yes  [provider]  alfuzosin (UROXATRAL) 10 MG 24 hr tablet Take 1 tablet (10 mg total) by mouth at bedtime. 02/10/16  Yes Kathie Rhodes, MD  Artificial Tear Ointment (LACRI-LUBE S.O.P. OP) Place 0.25 inches into both eyes at bedtime. LOWER EYELIDS   Yes [provider]  aspirin EC 81 MG tablet Take 81 mg by mouth daily.    Yes [provider]  brimonidine-timolol (COMBIGAN) 0.2-0.5 % ophthalmic solution Place 1 drop into both eyes 2 (two) times daily.   Yes [provider]  brinzolamide (AZOPT) 1 % ophthalmic suspension Place 1 drop into the right eye 3 (three) times daily.  09/03/16  Yes [provider]  carbidopa-levodopa (SINEMET IR) 25-100 MG tablet Take 1 tablet by mouth See admin instructions. Take 1 tablet by mouth three times a day- 8 AM, 2 PM, and 8 PM   Yes [provider]  Cholecalciferol (VITAMIN D-3) 5000 units TABS Take 5,000 Units by mouth daily.   Yes [provider]  colesevelam (WELCHOL) 625 MG tablet Take 1,875 mg by mouth daily.    Yes [provider]  docusate sodium (COLACE) 100 MG capsule Take 100 mg by mouth 2 (two) times daily.    Yes [provider]  gabapentin (NEURONTIN) 100 MG capsule Take 100 mg by mouth at bedtime.   Yes [provider]  guaiFENesin (MUCINEX) 600 MG 12 hr tablet Take 600 mg by mouth 2 (two) times daily.    Yes [provider]  hydroxypropyl methylcellulose / hypromellose (ISOPTO TEARS / GONIOVISC) 2.5 % ophthalmic solution Place 1 drop into both eyes 4 (four) times daily.   Yes [provider]  latanoprost (XALATAN) 0.005 % ophthalmic solution Place 1 drop  into the right eye at bedtime.  07/28/16  Yes [provider]  levothyroxine (SYNTHROID, LEVOTHROID) 175 MCG tablet Take 175 mcg by mouth daily before breakfast.   Yes [provider]  Melatonin 3 MG TABS Take 3 mg by mouth at bedtime.   Yes [provider]  Meth-Hyo-M  Bl-Na Phos-Ph Sal (URIBEL) 118 MG CAPS Take 118 mg by mouth 3 (three) times daily as needed (BURNING).   Yes [provider]  metoprolol succinate (TOPROL-XL) 25 MG 24 hr tablet Take 1 tablet (25 mg total) by mouth daily. Patient taking differently: Take 25 mg by mouth every other day.  08/08/17  Yes Bonnielee Haff, MD  mirabegron ER (MYRBETRIQ) 50 MG TB24 tablet Take 1 tablet (50 mg total) by mouth daily. 01/28/16  Yes McKenzie, Candee Furbish, MD  Multiple Vitamins-Minerals (PRESERVISION/LUTEIN) CAPS Take 1 capsule by mouth 2 (two) times daily.   Yes [provider]  omeprazole (PRILOSEC) 40 MG capsule Take 40 mg by mouth daily.   Yes [provider]  polyethylene glycol (MIRALAX / GLYCOLAX) packet Take 17 g by mouth daily. [place]   Yes [provider]  potassium chloride (K-DUR,KLOR-CON) 10 MEQ tablet Take 10 mEq by mouth daily.   Yes [provider]  sitaGLIPtin (JANUVIA) 50 MG tablet Take 1 tablet (50 mg total) by mouth daily. 10/25/15  Yes Tat, Shanon Brow, MD  ticagrelor (BRILINTA) 90 MG TABS tablet Take 1 tablet (90 mg total) by mouth 2 (two) times daily. 08/08/17  Yes Bonnielee Haff, MD  traMADol (ULTRAM) 50 MG tablet Take 25 mg by mouth 2 (two) times daily as needed (for pain).    Yes [provider]  zolpidem (AMBIEN) 5 MG tablet Take 0.5 tablets (2.5 mg total) by mouth at bedtime as needed for sleep. 08/08/17  Yes Bonnielee Haff, MD  fexofenadine (ALLEGRA) 180 MG tablet Take 180 mg by mouth daily.    [provider]    Physical Exam: Vitals:   03/10/18 1000 03/10/18 1242 03/10/18 1408 03/10/18 1808  BP:  (!) 148/80 (!) 146/79 131/73  Pulse:  76 70 68  Resp:  20 18   Temp:  97.8 F (36.6 C) 98.1 F (36.7 C)   TempSrc:  Oral Oral   SpO2:  100% 99%   Weight: 69.5 kg     Height: 5\' 10"  (1.778 m)        . General:  Appears calm and comfortable and is NAD . Eyes:   EOMI, normal lids, iris . ENT:  Hard of hearing, normal  lips & tongue, mmm . Neck:  no LAD, masses or thyromegaly . Cardiovascular:  Irregularly irregular with reasonable rate control on diltiazem, no m/r/g. No LE edema.  Marland Kitchen Respiratory:   CTA bilaterally with no wheezes/rales/rhonchi.  Normal respiratory effort. . Abdomen:  soft, NT, ND, NABS . Skin:  no rash or induration seen on limited exam . Musculoskeletal:  grossly normal tone BUE/BLE, good ROM, no bony abnormality . Psychiatric:  grossly normal mood and affect, speech fluent and appropriate, AOx3 . Neurologic:  CN 2-12 grossly intact, moves all extremities in coordinated fashion, sensation intact    Radiological Exams on Admission: Dg Chest Port 1 View  Result Date: 03/10/2018 CLINICAL DATA:  85 year old male with chest pain. EXAM: PORTABLE CHEST 1 VIEW COMPARISON:  Chest radiograph dated 08/13/2017 FINDINGS: Left lung base atelectatic changes/scarring. No lobar consolidation, or pleural effusion. No pneumothorax. Linear lucency along the right lateral chest wall, likely artifactual  and related to skin fold. There is mild cardiomegaly. Atherosclerotic calcification of the aortic arch. No acute osseous pathology. Degenerative changes of the left shoulder. IMPRESSION: No active disease. Electronically Signed   By: Anner Crete M.D.   On: 03/10/2018 06:54    EKG: Independently reviewed.  Afib with rate 150; nonspecific ST changes that appear to be rate-related   Labs on Admission: I have personally reviewed the available labs and imaging studies at the time of the admission.  Pertinent labs:   CO2 21 Glucose 113 BUN 29/Creatinine 1.54/GFR 41 Troponin 0.00; 0.31, 0.29 WBC 6.0 Hgb 9.3 INR 1.1 Lipids from 10/9: 148/46/90/58 Normal TSH on 08/14/17 A1c 6.3 on 7/24  Assessment/Plan Principal Problem:   Atrial fibrillation with RVR (HCC) Active Problems:   Hypothyroidism (post surgical)   Diabetes mellitus, insulin dependent (IDDM), controlled ([place])   Benign essential HTN   Chest  pain   CKD (chronic kidney disease), stage III ([place])   Hyperlipidemia LDL goal <70     Afib with RVR with associated chest pain -Patient presenting with new-onset afib.  -He had one prior known episode but it was thought to be transient post-surgically and so he was not started on AC at that time. -He also had new-onset chest pain, which may be related to or separate from afib -Will admit to SDU for Diltiazem drip as per protocol with plan to transition to PO Diltiazem once heart rate is controlled.   -Troponin q6h x 3 - mildly uptrending which may indicate demand ischemia; will continue to trend. -Treat chest pain with NTG prn.   -Will request Echocardiogram for further evaluation  -Will risk stratify with FLP and HgbA1c; will also order TSH and free T4 -Repeat EKG in AM -Consult cardiology -CHA2DS2-VASc Score is >2 and so patient will need oral anticoagulation; he has been started on Heparin for now given possible ACS in conjunction with afib. -CXR unremarkable.   -Initial cardiac troponin negative.  -EKG not indicative of acute ischemia. -Continue ASA, Brilinta -h/o NSTEMI with stent placement in 7/19  HTN -Hold Toprol XL - restarted by Dr. Meda Coffee  HLD -Statin intolerant so referred to lipid clinic -Continue Welchol  CKD stage 3 -Appears to be at/near baseline, will follow  DM  -A1c 6.3 -Hold Januvia -Will cover with moderate-scale SSI  Hypothyroidism -Check TSH and free T4 -Continue Synthroid at current dose for now      DVT prophylaxis: Heparin Code Status:  DNR - confirmed with patient Family Communication: None present Disposition Plan:  Home once clinically improved Consults called: Cardiology Admission status:  It is my clinical opinion that referral for OBSERVATION is reasonable and necessary in this patient based on the above information provided. The aforementioned taken together are felt to place the patient at high risk for further clinical  deterioration. However it is anticipated that the patient may be medically stable for discharge from the hospital within 24 to 48 hours.     Karmen Bongo MD [facility]   How to contact the [facility] Attending or Consulting provider [place] or covering provider during after hours [place], for this patient?  1. Check the care team in [facility] and look for a) attending/consulting TR[facility] provider listed and b) the [facility] team listed 2. Log into [email or web address] and use [place]'s universal password to access. If you do not have the password, please contact the hospital operator. 3. Locate the [facility] provider you are looking for under [facility] and page to  a number that you can be directly reached. 4. If you still have difficulty reaching the provider, please page the [facility] (Director on Call) for the Hospitalists listed on amion for assistance.   03/10/2018, 6:24 PM

## 2018-03-11 ENCOUNTER — Observation Stay (HOSPITAL_BASED_OUTPATIENT_CLINIC_OR_DEPARTMENT_OTHER): Payer: Medicare Other

## 2018-03-11 DIAGNOSIS — I10 Essential (primary) hypertension: Secondary | ICD-10-CM | POA: Diagnosis not present

## 2018-03-11 DIAGNOSIS — I4891 Unspecified atrial fibrillation: Secondary | ICD-10-CM

## 2018-03-11 DIAGNOSIS — I25118 Atherosclerotic heart disease of native coronary artery with other forms of angina pectoris: Secondary | ICD-10-CM | POA: Diagnosis not present

## 2018-03-11 LAB — BASIC METABOLIC PANEL
Anion gap: 8 (ref 5–15)
BUN: 24 mg/dL — ABNORMAL HIGH (ref 8–23)
CHLORIDE: 109 mmol/L (ref 98–111)
CO2: 20 mmol/L — ABNORMAL LOW (ref 22–32)
Calcium: 8.4 mg/dL — ABNORMAL LOW (ref 8.9–10.3)
Creatinine, Ser: 1.51 mg/dL — ABNORMAL HIGH (ref 0.61–1.24)
GFR calc Af Amer: 48 mL/min — ABNORMAL LOW (ref >60)
GFR calc non Af Amer: 42 mL/min — ABNORMAL LOW (ref >60)
Glucose, Bld: 94 mg/dL (ref 70–99)
Potassium: 3.8 mmol/L (ref 3.5–5.1)
Sodium: 137 mmol/L (ref 135–145)

## 2018-03-11 LAB — GLUCOSE, CAPILLARY
Glucose-Capillary: 121 mg/dL — ABNORMAL HIGH (ref 70–99)
Glucose-Capillary: 131 mg/dL — ABNORMAL HIGH (ref 70–99)

## 2018-03-11 LAB — CBC
HCT: 29.9 % — ABNORMAL LOW (ref 39.0–52.0)
Hemoglobin: 9.2 g/dL — ABNORMAL LOW (ref 13.0–17.0)
MCH: 27.1 pg (ref 26.0–34.0)
MCHC: 30.8 g/dL (ref 30.0–36.0)
MCV: 87.9 fL (ref 80.0–100.0)
Platelets: 205 10*3/uL (ref 150–400)
RBC: 3.4 MIL/uL — ABNORMAL LOW (ref 4.22–5.81)
RDW: 13 % (ref 11.5–15.5)
WBC: 5.2 10*3/uL (ref 4.0–10.5)
nRBC: 0 % (ref 0.0–0.2)

## 2018-03-11 LAB — LIPID PANEL
CHOL/HDL RATIO: 4.1 ratio
Cholesterol: 165 mg/dL (ref 0–200)
HDL: 40 mg/dL — AB (ref >40)
LDL Cholesterol: 112 mg/dL — ABNORMAL HIGH (ref 0–99)
Triglycerides: 65 mg/dL (ref <150)
VLDL: 13 mg/dL (ref 0–40)

## 2018-03-11 LAB — ECHOCARDIOGRAM COMPLETE
Height: 70 in
Weight: 2441.6 oz

## 2018-03-11 LAB — TROPONIN I
Troponin I: 0.25 ng/mL (ref <0.03)
Troponin I: 0.2 ng/mL (ref <0.03)

## 2018-03-11 MED ORDER — CLOPIDOGREL BISULFATE 75 MG PO TABS: 75.0000 mg | ORAL_TABLET | Freq: Every day | ORAL | 0 refills | Status: AC

## 2018-03-11 MED ORDER — DILTIAZEM HCL ER COATED BEADS 120 MG PO CP24: 120.0000 mg | ORAL_CAPSULE | Freq: Every day | ORAL | 0 refills | Status: AC

## 2018-03-11 MED ORDER — METOPROLOL SUCCINATE ER 25 MG PO TB24: 12.5000 mg | ORAL_TABLET | Freq: Every day | ORAL | 0 refills | Status: DC

## 2018-03-11 MED ORDER — APIXABAN 2.5 MG PO TABS: 2.5000 mg | ORAL_TABLET | Freq: Two times a day (BID) | ORAL | 0 refills | Status: DC

## 2018-03-11 NOTE — Clinical Social Work Note (Signed)
Clinical Social Work Assessment  Patient Details  Name: [name] MRN: [id number] Date of Birth: 08/27/1931  Date of referral:  03/11/18               Reason for consult:  Discharge Planning                Permission sought to share information with:  Facility Contact Representative, Family Supports Permission granted to share information::  Yes, Verbal Permission Granted  Name::     Frankie Heath  Agency::  [facility]  Relationship::  sister  Contact Information:  [phone]  Housing/Transportation Living arrangements for the past 2 months:  Assisted Living Facility Source of Information:  Patient Patient Interpreter Needed:  None Criminal Activity/Legal Involvement Pertinent to Current Situation/Hospitalization:  No - Comment as needed Significant Relationships:  Siblings Lives with:  Facility Resident Do you feel safe going back to the place where you live?  Yes Need for family participation in patient care:  No (Coment)  Care giving concerns: Patient from [facility].    Social Worker assessment / plan: CSW met with patient at bedside. Patient alert and oriented, sitting up on edge of bed eating breakfast. CSW introduced self and role and discussed disposition planning.  Patient reported he lives at [facility] and plans to return there. He usually uses a scooter to get around at the ALF, but can also walk around with his cane. He gets dressed and makes his bed independently, but gets help with showers. He stated his sister can drive him back to ALF.  Spoke to med tech at the facility. They requested signed FL2 to review. Faxed FL2. They will need DC summary to review any new medications prior to patient returning to the facility.  Also spoke to patient's sister on the phone. Her husband will come pick patient up to take him back to facility when discharged. CSW to follow.  Employment status:  Retired Insurance information:  Medicare PT Recommendations:   Not assessed at this time Information / Referral to community resources:     Patient/Family's Response to care: Patient appreciative of care.  Patient/Family's Understanding of and Emotional Response to Diagnosis, Current Treatment, and Prognosis: Patient with understanding of his conditions. Hopeful to discharge from the hospital today.  Emotional Assessment Appearance:  Appears stated age Attitude/Demeanor/Rapport:  Engaged Affect (typically observed):  Accepting, Appropriate, Pleasant Orientation:  Oriented to Self, Oriented to Place, Oriented to  Time, Oriented to Situation Alcohol / Substance use:  Not Applicable Psych involvement (Current and /or in the community):  No (Comment)  Discharge Needs  Concerns to be addressed:  Discharge Planning Concerns, Care Coordination Readmission within the last 30 days:  No Current discharge risk:  None Barriers to Discharge:  No Barriers Identified    , LCSW 03/11/2018, 9:55 AM  

## 2018-03-11 NOTE — Care Management (Signed)
1.   S/W  BECCA  @ [facility] # [phone] OPT- MEMBER   APIXABAN : NON-FORMULARY  ELIQUIS  2.5 MG BID COVER- YES CO-PAY- $ 99.49 TIER- 2 DRUG PRIOR APPROVAL- NO  2. ELIQUIS 5 MG BID COVER- YES CO-PAY- $ 99.49 TIER- 2 DRUG PRIOR APPROVAL- NO  DEDUCTIBLE : NOT MET  PREFERRED PHARMACY: [facility] M[facility]O 90 DAY SUPPLY FOR M/O  90.00

## 2018-03-11 NOTE — Care Management Obs Status (Signed)
[facility] NOTIFICATION   Patient Details  Name: [name] MRN: [id number] Date of Birth: [birth year]   Medicare Observation Status Notification Given:  Yes    Bethena Roys, RN 03/11/2018, 10:24 AM

## 2018-03-11 NOTE — Discharge Summary (Signed)
Physician Discharge Summary  Loretta Kluender Medina ESP:[id number] DOB: 1931-11-21 DOA: 03/10/2018  PCP: Lajean Manes, MD  Admit date: 03/10/2018 Discharge date: 03/11/2018  Time spent: 35 minutes  Recommendations for Outpatient Follow-up:  1. Follow up outpatient CBC/CMP 2. Renal function should be followed outpatient to determine if any adjustment needed for eliquis 3. Follow up heart rate on new regimen.  Diltiazem 120 mg daily and metop 12.5 daily.  4. Started on eliquis 2.5 mg BID, continue as outpatient for stroke prevention.  Stop aspirin and brilinta and start plavix.  5.  Follow repeat thyroid studies as outpatient.  Synthroid dose may need to be adjusted. 6. Follow up with cardiology outpatient. 7. Ensure follow up with lipid clinic   Discharge Diagnoses:  Principal Problem:   Atrial fibrillation with RVR ([place]) Active Problems:   Hypothyroidism (post surgical)   Diabetes mellitus, insulin dependent (IDDM), controlled ([place])   Benign essential HTN   Chest pain   CKD (chronic kidney disease), stage III ([place])   Hyperlipidemia LDL goal <70   Discharge Condition: stable  Diet recommendation: heart healthy  Filed Weights   03/10/18 1000 03/11/18 0541  Weight: 69.5 kg 69.2 kg    History of present illness:   [name] is [name] 85 y.o. male with medical history significant of CVA; prostate CA s/p radiation therapy; hypothyroidism; macular degeneration; HTN; HLD; DM; and afib not on AC presenting with afib with RVR.  He got overnight to go to the bathroom and noticed chest pain.  He was concerned he wouldn't make it back to bed.  Midsternal pain.  He does not think he got NTG en route.  EMS said his heart was beating too fast and they gave him something to slow his heart down and that made his pain go away.  This is the same thing that happened when he had [name] heart attack about [name] year ago.  His pain is resolved.  He had been feeling well prior, was preparing to go to [facility] on  Sunday.  He rides [name] stationary bike for 15 minutes [name] day without chest pain.  He could feel the RVR while he was having the chest pain.  He thinks he was on Coumadin in the past once, but he is unsure why it was stopped.  He is not on AC at this time.  He was admitted for [name] fib with RVR with chest pain.  He had elevated troponin which was thought due to demand ischemia.  He was treated with diltiazem and his HR improved.  Now on PO diltiazem.  Seen by cardiology who note troponins likely demand ischemia, recommended eliquis 2.5 mg BID, continue metoprolol, and add diltiazem 120 mg daily.  Recommended transition to plavix and stop aspirin and brilinta with start of anticoagulation.  Hospital Course:  Afib with RVR with associated chest pain -Patient presenting with new-onset afib.  -He had one prior known episode but it was thought to be transient post-surgically and so he was not started on [facility] at that time. - Improved today after starting dilt  - Doing well on diltiazem 120 mg daily with metoprolol 12.5 mg daily. - Started on eliquis 2.5 mg BID, chadsvasc 8, high stroke risk - follow renal function outpatient  -troponins mildly elevated, likely 2/2 demand ischemia -echo with normal EF, no noted WMA (see below) - a1c 6.3, lipids with LDL 112 and HDL 40, needs outpatient follow up in lipid clinic as statin intolerant - follow up  thyroid studies as outpatient, TSH low, free T4 wnl -Consult cardiology - appreciate recs - now sinus rhythm - start eliquis 2.5 mg BID, start diltiazem 120 mg daily, continue metoprolol.  Discontinue aspirin and brilinta.  Start plavix.  Follow up with lipid clinic.  HTN -continue dilt/metop  HLD -Statin intolerant so referred to lipid clinic -Continue Welchol  CKD stage 3 -Appears to be at/near baseline, will follow  DM  -A1c 6.3 -Hold Januvia -Will cover with moderate-scale SSI  Hypothyroidism -Needs repeat TFT as outpatient, synthroid dose may need to  be adjusted -Continue Synthroid at current dose for now  History of CAD: s/p NSTEMI and DES to circumflex 07/2017 Aspirin stopped and brinlinta stopped Start plavix in setting of need for anticoagulation Lipid clinic referral  Procedures: Echo IMPRESSIONS    1. The left ventricle has normal systolic function with an ejection fraction of 60-65%. The cavity size was normal. There is mildly increased left ventricular wall thickness. Left ventricular diastolic Doppler parameters are consistent with  pseudonormalization Elevated left ventricular end-diastolic pressure The E/e' is 15.  2. The right ventricle has normal systolic function. The cavity was normal. There is no increase in right ventricular wall thickness.  3. The mitral valve is degenerative. Mild calcification of the mitral valve leaflet.  4. The aortic valve is tricuspid Mild thickening of the aortic valve Moderate sclerosis of the aortic valve. Aortic valve regurgitation is trivial by color flow Doppler.  5. The aortic root and ascending aorta are normal in size and structure.  6. The inferior vena cava was dilated in size with >50% respiratory variability.  7. When compared to the prior study: LVEF 55-60% in 07/2017, grade 2 DD.  Consultations:  cardiology  Discharge Exam: Vitals:   03/11/18 0856 03/11/18 0936  BP: (!) 153/97   Pulse:  72  Resp:    Temp:    SpO2:     Denies CP. Feels ok for discharge.  General: No acute distress. Cardiovascular: Heart sounds show [name] regular rate, and rhythm. Lungs: Clear to auscultation bilaterally Abdomen: Soft, nontender, nondistended  Neurological: Alert and oriented 3. Moves all extremities 4 Cranial nerves II through XII grossly intact. Skin: Warm and dry. No rashes or lesions. Extremities: No clubbing or cyanosis. No edema.  Psychiatric: Mood and affect are normal. Insight and judgment are appropriate.  Discharge Instructions   Discharge Instructions    Call MD  for:  difficulty breathing, headache or visual disturbances   Complete by:  As directed    Call MD for:  extreme fatigue   Complete by:  As directed    Call MD for:  hives   Complete by:  As directed    Call MD for:  persistant dizziness or light-headedness   Complete by:  As directed    Call MD for:  persistant nausea and vomiting   Complete by:  As directed    Call MD for:  redness, tenderness, or signs of infection (pain, swelling, redness, odor or green/yellow discharge around incision site)   Complete by:  As directed    Call MD for:  severe uncontrolled pain   Complete by:  As directed    Call MD for:  temperature >100.4   Complete by:  As directed    Diet - low sodium heart healthy   Complete by:  As directed    Discharge instructions   Complete by:  As directed    You were seen for new onset atrial fibrillation.  You improved with new medications for your heart rate.  We've started you on diltiazem 120 mg daily.  We've also transitioned you to 12.5 mg metoprolol daily from 25 mg every other day.  You've been started on eliquis for your atrial fibrillation to help reduce the risk of stroke.  Please follow up with cardiology.   Please follow up with your PCP.  Return for new, recurrent, or worsening symptoms.  Please ask your PCP to request records from this hospitalization so they know what was done and what the next steps will be.   Increase activity slowly   Complete by:  As directed      Allergies as of 03/11/2018      Reactions   Colchicine Anaphylaxis   Hibiclens [chlorhexidine] Hives   Atorvastatin Other (See Comments)   Joint pain    Ceftin Diarrhea   Celebrex [celecoxib] Swelling   Codeine Hives   Erythromycin Diarrhea   Ezetimibe Other (See Comments)   Joint pain    Iodinated Diagnostic Agents Nausea And Vomiting, Rash   Oxycodone-acetaminophen Hives   Rosuvastatin Other (See Comments)   Joint pain    Simvastatin Other (See Comments)   Joint pain     Cefpodoxime Rash   Cefuroxime Axetil Rash   Nitroglycerin Other (See Comments)   Lowers patient's B/P   Tetanus Toxoid Rash   Vantin Other (See Comments)   Unknown/not noted on MAR??      Medication List    STOP taking these medications   aspirin EC 81 MG tablet   ticagrelor 90 MG Tabs tablet Commonly known as:  BRILINTA     TAKE these medications   alfuzosin 10 MG 24 hr tablet Commonly known as:  UROXATRAL Take 1 tablet (10 mg total) by mouth at bedtime.   apixaban 2.5 MG Tabs tablet Commonly known as:  ELIQUIS Take 1 tablet (2.5 mg total) by mouth 2 (two) times daily for 30 days.   brinzolamide 1 % ophthalmic suspension Commonly known as:  AZOPT Place 1 drop into the right eye 3 (three) times daily.   carbidopa-levodopa 25-100 MG tablet Commonly known as:  SINEMET IR Take 1 tablet by mouth See admin instructions. Take 1 tablet by mouth three times Tj Kitchings day- 8 AM, 2 PM, and 8 PM   clopidogrel 75 MG tablet Commonly known as:  PLAVIX Take 1 tablet (75 mg total) by mouth daily for 30 days. Start taking on:  March 12, 2018   colesevelam 625 MG tablet Commonly known as:  WELCHOL Take 1,875 mg by mouth daily.   COMBIGAN 0.2-0.5 % ophthalmic solution Generic drug:  brimonidine-timolol Place 1 drop into both eyes 2 (two) times daily.   diltiazem 120 MG 24 hr capsule Commonly known as:  CARDIZEM CD Take 1 capsule (120 mg total) by mouth daily for 30 days. Start taking on:  March 12, 2018   docusate sodium 100 MG capsule Commonly known as:  COLACE Take 100 mg by mouth 2 (two) times daily.   gabapentin 100 MG capsule Commonly known as:  NEURONTIN Take 100 mg by mouth at bedtime.   hydroxypropyl methylcellulose / hypromellose 2.5 % ophthalmic solution Commonly known as:  ISOPTO TEARS / GONIOVISC Place 1 drop into both eyes 4 (four) times daily.   LACRI-LUBE S.O.P. OP Place 0.25 inches into both eyes at bedtime. LOWER EYELIDS   latanoprost 0.005 % ophthalmic  solution Commonly known as:  XALATAN Place 1 drop into the right eye at bedtime.  levothyroxine 175 MCG tablet Commonly known as:  SYNTHROID, LEVOTHROID Take 175 mcg by mouth daily before breakfast.   Melatonin 3 MG Tabs Take 3 mg by mouth at bedtime.   metoprolol succinate 25 MG 24 hr tablet Commonly known as:  TOPROL-XL Take 0.5 tablets (12.5 mg total) by mouth daily for 30 days. Start taking on:  March 12, 2018 What changed:  how much to take   mirabegron ER 50 MG Tb24 tablet Commonly known as:  MYRBETRIQ Take 1 tablet (50 mg total) by mouth daily.   MUCINEX 600 MG 12 hr tablet Generic drug:  guaiFENesin Take 600 mg by mouth 2 (two) times daily.   omeprazole 40 MG capsule Commonly known as:  PRILOSEC Take 40 mg by mouth daily.   polyethylene glycol packet Commonly known as:  MIRALAX / GLYCOLAX Take 17 g by mouth daily. MIX AND DRINK   potassium chloride 10 MEQ tablet Commonly known as:  K-DUR,KLOR-CON Take 10 mEq by mouth daily.   PRESERVISION/LUTEIN Caps Take 1 capsule by mouth 2 (two) times daily.   PROAIR HFA 108 (90 Base) MCG/ACT inhaler Generic drug:  albuterol Inhale 2 puffs into the lungs every 8 (eight) hours as needed for shortness of breath.   sitaGLIPtin 50 MG tablet Commonly known as:  JANUVIA Take 1 tablet (50 mg total) by mouth daily.   traMADol 50 MG tablet Commonly known as:  ULTRAM Take 25 mg by mouth 2 (two) times daily as needed (for pain).   URIBEL 118 MG Caps Take 118 mg by mouth 3 (three) times daily as needed (BURNING).   Vitamin D-3 125 MCG (5000 UT) Tabs Take 5,000 Units by mouth daily.   zolpidem 5 MG tablet Commonly known as:  AMBIEN Take 0.5 tablets (2.5 mg total) by mouth at bedtime as needed for sleep.      Allergies  Allergen Reactions  . Colchicine Anaphylaxis  . Hibiclens [Chlorhexidine] Hives  . Atorvastatin Other (See Comments)    Joint pain   . Ceftin Diarrhea  . Celebrex [Celecoxib] Swelling  . Codeine  Hives  . Erythromycin Diarrhea  . Ezetimibe Other (See Comments)    Joint pain   . Iodinated Diagnostic Agents Nausea And Vomiting and Rash  . Oxycodone-Acetaminophen Hives  . Rosuvastatin Other (See Comments)    Joint pain   . Simvastatin Other (See Comments)    Joint pain   . Cefpodoxime Rash  . Cefuroxime Axetil Rash  . Nitroglycerin Other (See Comments)    Lowers patient's B/P  . Tetanus Toxoid Rash  . Vantin Other (See Comments)    Unknown/not noted on MAR??   Follow-up Information    Imogene Burn, PA-C Follow up.   Specialty:  Cardiology Why:  Cardiology hospital follow-up on 03/30/2018 at 11:00 AM.  Please arrive 15 minutes early for check-in. Contact information: [address]  [place] [address] [phone]        Lajean Manes, MD Follow up.   Specialty:  Internal Medicine Why:  Call for follow up Contact information: [address] [place] [address] [phone]        Sueanne Margarita, MD .   Specialty:  Cardiology Contact information: [phone] [address] [place] [address] [phone]            The results of significant diagnostics from this hospitalization (including imaging, microbiology, ancillary and laboratory) are listed below for reference.    Significant Diagnostic Studies:  Dg Chest Port 1 View  Result Date: 03/10/2018 CLINICAL DATA:  85 year old male with chest pain. EXAM: PORTABLE CHEST 1 VIEW COMPARISON:  Chest radiograph dated 08/13/2017 FINDINGS: Left lung base atelectatic changes/scarring. No lobar consolidation, or pleural effusion. No pneumothorax. Linear lucency along the right lateral chest wall, likely artifactual and related to skin fold. There is mild cardiomegaly. Atherosclerotic calcification of the aortic arch. No acute osseous pathology. Degenerative changes of the left shoulder. IMPRESSION: No active disease. Electronically Signed   By: Anner Crete M.D.   On:  03/10/2018 06:54    Microbiology: No results found for this or any previous visit (from the past 240 hour(s)).   Labs: Basic Metabolic Panel: Recent Labs  Lab 03/10/18 0401 03/11/18 0358  NA 139 137  K 3.7 3.8  CL 110 109  CO2 21* 20*  GLUCOSE 113* 94  BUN 29* 24*  CREATININE 1.54* 1.51*  CALCIUM 8.3* 8.4*   Liver Function Tests: Recent Labs  Lab 03/10/18 0401  AST 6*  ALT <5  ALKPHOS 67  BILITOT 0.9  PROT 5.7*  ALBUMIN 3.2*   No results for input(s): LIPASE, AMYLASE in the last 168 hours. No results for input(s): AMMONIA in the last 168 hours. CBC: Recent Labs  Lab 03/10/18 0401 03/11/18 0358  WBC 6.0 5.2  NEUTROABS 3.3  --   HGB 9.3* 9.2*  HCT 30.5* 29.9*  MCV 90.8 87.9  PLT 197 205   Cardiac Enzymes: Recent Labs  Lab 03/10/18 1148 03/10/18 1543 03/10/18 2215 03/11/18 0358 03/11/18 0951  TROPONINI 0.31* 0.29* 0.27* 0.25* 0.20*   BNP: BNP (last 3 results) Recent Labs    08/04/17 0320  BNP 431.0*    ProBNP (last 3 results) No results for input(s): PROBNP in the last 8760 hours.  CBG: Recent Labs  Lab 03/10/18 1234 03/10/18 1703 03/10/18 2218 03/11/18 0756 03/11/18 1205  GLUCAP 98 170* 77 121* 131*       Signed:  Fayrene Helper MD.  [facility] 03/11/2018, 1:47 PM

## 2018-03-11 NOTE — Social Work (Signed)
Patient will discharge back to [facility]. Anticipated discharge date: 03/11/2018 Family notified: Kevan Rosebush, sister Transportation by: brother-in-law will drive patient  Nurse to call report to [phone].  CSW signing off.  Estanislado Emms, [place]  Clinical Social Worker

## 2018-03-11 NOTE — Care Management Note (Signed)
Case Management Note  Patient Details  Name: DEMARIOUS KAPUR MRN: [id number] Date of Birth: 12/10/1931  Subjective/Objective: Pt presented for Atrial Fib- PTA from [facility]. Plan to return- CSW following for disposition needs.                    Action/Plan: CM did provide patient with 30 day free Eliquis card. Benefits check submitted. No further needs from CM at this time.   Expected Discharge Date:                  Expected Discharge Plan:  Assisted Living / Rest Home  In-House Referral:  Clinical Social Work  Discharge planning Services  CM Consult  Post Acute Care Choice:  NA Choice offered to:  NA  DME Arranged:  N/A DME Agency:  NA  HH Arranged:  NA HH Agency:  NA  Status of Service:  Completed, signed off  If discussed at [place] of Stay Meetings, dates discussed:    Additional Comments:  Bethena Roys, RN 03/11/2018, 9:58 AM

## 2018-03-11 NOTE — Progress Notes (Addendum)
Progress Note  Patient Name: [name] Date of Encounter: 03/11/2018  Primary Cardiologist: Fransico Him, MD   Subjective   Patient has had no further chest pain and no shortness of breath.  He is coughing quite a bit which he says he does every morning after he eats breakfast.  Inpatient Medications    Scheduled Meds: . alfuzosin  10 mg Oral QHS  . apixaban  2.5 mg Oral BID  . brimonidine  1 drop Both Eyes BID   And  . timolol  1 drop Both Eyes BID  . brinzolamide  1 drop Right Eye TID  . carbidopa-levodopa  1 tablet Oral 3 times per day  . clopidogrel  75 mg Oral Daily  . colesevelam  1,875 mg Oral Daily  . diltiazem  120 mg Oral Daily  . docusate sodium  100 mg Oral BID  . gabapentin  100 mg Oral QHS  . guaiFENesin  600 mg Oral BID  . insulin aspart  0-15 Units Subcutaneous TID WC  . insulin aspart  0-5 Units Subcutaneous QHS  . latanoprost  1 drop Right Eye QHS  . levothyroxine  175 mcg Oral QAC breakfast  . Melatonin  3 mg Oral QHS  . metoprolol succinate  12.5 mg Oral Daily  . mirabegron ER  50 mg Oral Daily  . pantoprazole  40 mg Oral Daily  . polyethylene glycol  17 g Oral Daily  . polyvinyl alcohol  1 drop Both Eyes QID   Continuous Infusions:  PRN Meds: acetaminophen, ondansetron (ZOFRAN) IV, traMADol, zolpidem   Vital Signs    Vitals:   03/10/18 1808 03/10/18 2023 03/11/18 0541 03/11/18 0856  BP: 131/73 125/67 (!) 151/87 (!) 153/97  Pulse: 68 69 70   Resp: 17 (!) 22    Temp:  97.7 F (36.5 C) 98.1 F (36.7 C)   TempSrc:  Oral Oral   SpO2:  100% 97%   Weight:   69.2 kg   Height:        Intake/Output Summary (Last 24 hours) at 03/11/2018 0939 Last data filed at 03/11/2018 4580 Gross per 24 hour  Intake 548.32 ml  Output 1680 ml  Net -1131.68 ml   Last 3 Weights 03/11/2018 03/10/2018 10/19/2017  Weight (lbs) 152 lb 9.6 oz 153 lb 4.8 oz 152 lb  Weight (kg) 69.219 kg 69.536 kg 68.947 kg      Telemetry    Sinus rhythm in the 60s-  Personally Reviewed  ECG    No new tracings- Personally Reviewed  Physical Exam   GEN: No acute distress.   Neck: No JVD Cardiac: RRR, no murmurs, rubs, or gallops.  Respiratory: Clear to auscultation bilaterally. GI: Soft, nontender, non-distended  MS: No edema; kyphosis Neuro:  Nonfocal  Psych: Normal affect   Labs    Chemistry Recent Labs  Lab 03/10/18 0401 03/11/18 0358  NA 139 137  K 3.7 3.8  CL 110 109  CO2 21* 20*  GLUCOSE 113* 94  BUN 29* 24*  CREATININE 1.54* 1.51*  CALCIUM 8.3* 8.4*  PROT 5.7*  --   ALBUMIN 3.2*  --   AST 6*  --   ALT <5  --   ALKPHOS 67  --   BILITOT 0.9  --   GFRNONAA 41* 42*  GFRAA 47* 48*  ANIONGAP 8 8     Hematology Recent Labs  Lab 03/10/18 0401 03/11/18 0358  WBC 6.0 5.2  RBC 3.36* 3.40*  HGB 9.3* 9.2*  HCT 30.5* 29.9*  MCV 90.8 87.9  MCH 27.7 27.1  MCHC 30.5 30.8  RDW 12.9 13.0  PLT 197 205    Cardiac Enzymes Recent Labs  Lab 03/10/18 1148 03/10/18 1543 03/10/18 2215 03/11/18 0358  TROPONINI 0.31* 0.29* 0.27* 0.25*    Recent Labs  Lab 03/10/18 0408  TROPIPOC 0.00     BNPNo results for input(s): BNP, PROBNP in the last 168 hours.   DDimer No results for input(s): DDIMER in the last 168 hours.   Radiology    Dg Chest Port 1 View  Result Date: 03/10/2018 CLINICAL DATA:  85 year old male with chest pain. EXAM: PORTABLE CHEST 1 VIEW COMPARISON:  Chest radiograph dated 08/13/2017 FINDINGS: Left lung base atelectatic changes/scarring. No lobar consolidation, or pleural effusion. No pneumothorax. Linear lucency along the right lateral chest wall, likely artifactual and related to skin fold. There is mild cardiomegaly. Atherosclerotic calcification of the aortic arch. No acute osseous pathology. Degenerative changes of the left shoulder. IMPRESSION: No active disease. Electronically Signed   By: Anner Crete M.D.   On: 03/10/2018 06:54    Cardiac Studies   Echo pending  Cardiac cath  08/06/2017 Conclusion     Mid RCA to Dist RCA lesion is 25% stenosed.  Ost 1st Mrg to 1st Mrg lesion is 70% stenosed.  Mid Cx lesion is 80% stenosed.  A drug-eluting stent was successfully placed using a STENT SYNERGY DES 2.25X20.  Post intervention, there is a 0% residual stenosis.  Mid LAD lesion is 25% stenosed.  LV end diastolic pressure is normal.  There is no aortic valve stenosis.  Recommend uninterrupted dual antiplatelet therapy with Aspirin 81mg  daily and Ticagrelor 90mg  twice dailyfor a minimum of 12 months (ACS - Class I recommendation).   2D echo Study Conclusions  - Left ventricle: The cavity size was normal. There was mild focal basal hypertrophy of the septum. Systolic function was normal. The estimated ejection fraction was in the range of 55% to 60%. Wall motion was normal; there were no regional wall motion abnormalities. Features are consistent with a pseudonormal left ventricular filling pattern, with concomitant abnormal relaxation and increased filling pressure (grade 2 diastolic dysfunction). - Mitral valve: Calcified annulus. Mildly thickened, mildly calcified leaflets . Mild diffuse thickening of the anterior leaflet, consistent with rheumatic disease. - Left atrium: The atrium was moderately dilated. - Atrial septum: No defect or patent foramen ovale was identified   Patient Profile     85 y.o. male with a hx of NSTEMI (07/2017), DM2, HTN, HLD, CKD stage II, CVA, prostate cancer s/p radiation, hypothyroidism and atrial fibrillation not on anticoagulation who is being seen for the evaluation of AF with RVR with chest pain. Pt has one short episode of PAF after recent ureter stent placement in 08/2017 with spontaneous conversion to NSR with no indication for anticoagulation>>>presents with recurrent AF with RVR . Was started on Diltiazem CD 240mg  on last hospitalization (08/2017) however it does not appear that this was ever  prescribed at discharge and he was noted to be stable without restart    Assessment & Plan    Paroxysmal atrial fibrillation -A. fib with RVR converted to sinus rhythm with diltiazem and his home dose of beta-blocker.  Continues to maintain sinus rhythm today in the 60s. -CHA2DS2VASc =8 (age, CHF, HTN, CVA, prior MI, DM).  Patient has now been started on apixaban 2.5 mg twice daily. (Reduced dose for age and renal function-will need to follow renal function as an outpatient  as his dosing may need adjustment if renal function improves) -Troponins mildly elevated and flat pattern likely related to demand ischemia in the setting of A. fib with RVR.  No indication of ACS. -Echocardiogram has been done, not read yet.  If there is no change in echo patient can be discharged home on current regimen and we will arrange for follow-up in our office.  Chronic diastolic CHF -Per most recent echocardiogram LVEF 55-60% with no WMA, grade 2 diastolic dysfunction. -Patient has been volume stable.  Hypertension -Initially BP was mildly elevated -Patient on his home metoprolol with addition of diltiazem 120 mg. -Blood pressure mostly well controlled, slightly elevated this morning prior to morning medications. -Follow-up blood pressure in the office  CAD -Status post NSTEMI and DES to circumflex and 07/2017.   -Aspirin stopped and Brilinta switched to Plavix in setting of need for anticoagulation. -No statin secondary to intolerance>>lipid clinic referral  [facility] will sign off.   Medication Recommendations: Medication changes as above Other recommendations (labs, testing, etc): Follow-up renal function in the office Follow up as an outpatient: I will arrange for cardiology follow-up  For questions or updates, please contact [place] Please consult [email or web address] for contact info under     Signed, Daune Perch, NP  03/11/2018, 9:39 AM    The patient was seen, examined and discussed  with Daune Perch, NP-C and I agree with the above.   Assessment:  CAD, status post DES placement to mid circumflex artery in July 2019 for non-STEMI Paroxysmal atrial fibrillation with RVR Elevated troponin -believed to be demand ischemia in the settings of A. fib with RVR Hypertension Hyperlipidemia  Plan:  The patient tolerates p.o. Cardizem well, he remains in sinus rhythm overnight, we will continue Toprol-XL 12.5 mg daily, he was started on Eliquis 2.5 mg p.o. twice daily, aspirin and Brilinta were discontinued and Plavix was started.  He can be discharged today, he will be followed in our clinic for we will check labs and monitor symptoms of potential bleeding closely.    Ena Dawley, MD 03/11/2018

## 2018-03-11 NOTE — NC FL2 (Signed)
[place] LEVEL OF CARE SCREENING TOOL     IDENTIFICATION  Patient Name: [name] Birthdate: 07/27/1931 Sex: male Admission Date (Current Location): 03/10/2018  [facility] and [place] Number:  Herbalist and Address:  [facility] [place] [address], [place], [place] [address]      Provider Number: [id number]  Attending Physician Name and Address:  Elodia Florence., *  Relative Name and Phone Number:       Current Level of Care: Hospital Recommended Level of Care: [place] Prior Approval Number:    Date Approved/Denied:   PASRR Number:    Discharge Plan: Domiciliary (Rest home)(ALF)    Current Diagnoses: Patient Active Problem List   Diagnosis Date Noted  . Coronary artery disease involving native coronary artery of native heart without angina pectoris   . Hyperlipidemia LDL goal <70   . Sepsis ([place]) 08/14/2017  . Atrial fibrillation with RVR ([place]) 08/14/2017  . Hydronephrosis 08/13/2017  . Hydroureteronephrosis   . Right ureteral stone   . Urinary tract infection with hematuria   . Non-ST elevation (NSTEMI) myocardial infarction ([place])   . GERD (gastroesophageal reflux disease) 08/04/2017  . Elevated troponin 08/03/2017  . Chest pain 08/03/2017  . CKD (chronic kidney disease), stage III ([place]) 08/03/2017  . Type II diabetes mellitus with renal manifestations ([place]) 08/03/2017  . Pseudophakia of both eyes 05/31/2017  . Retained ureteral stent 04/22/2016  . Ureteral calculus 01/25/2016  . Mental status change 10/24/2015  . Encephalopathy 10/24/2015  . Hypothermia 10/24/2015  . Chronic renal insufficiency 10/24/2015  . Benign essential HTN 10/24/2015  . Hypoglycemia 10/24/2015  . Acute encephalopathy 10/24/2015  . CVA (cerebral vascular accident) ([place]) 10/14/2014  . Prerenal azotemia 10/14/2014  . Mild HTN 10/14/2014  . CVA (cerebral infarction)   . TIA (transient ischemic attack)   .  Diabetes mellitus type 2 without retinopathy ([place]) 03/27/2014  . CME (cystoid macular edema), left 08/10/2013  . PNAR (perennial non-allergic rhinitis) 05/23/2013  . Squamous cell carcinoma of eyelid 05/15/2013  . Squamous cell carcinoma 04/06/2013  . Blepharitis 03/16/2013  . Eyelid lesion 03/16/2013  . Seasonal and perennial allergic rhinitis 06/11/2012  . Posterior vitreous detachment of right eye 10/23/2011  . Exudative age-related macular degeneration ([place]) 07/28/2011  . Nonproliferative diabetic retinopathy ([place]) 04/07/2011  . Acute bronchitis due to infection 01/14/2011  . Nondiabetic proliferative retinopathy 10/17/2010  . Insomnia, unspecified 05/19/2010  . ATAXIA 03/17/2010  . DIZZINESS 03/06/2010  . Chronic constipation 02/21/2008  . OSTEOARTHRITIS, KNEE, SEVERE 01/12/2008  . HYPERLIPIDEMIA 01/17/2007  . Hypothyroidism (post surgical) 10/15/2006  . SKIN CANCER, HX OF 06/08/2006  . Diabetes mellitus, insulin dependent (IDDM), controlled ([place]) 04/22/2006  . Paroxysmal atrial fibrillation ([place])  CHADVASc = 5 04/22/2006  . URTICARIA 04/22/2006  . POPLITEAL CYST 04/22/2006  . THYROIDECTOMY, SUBTOTAL, HX OF 04/22/2006  . TOTAL KNEE REPLACEMENT, HX OF 04/22/2006    Orientation RESPIRATION BLADDER Height & Weight     Self, Time, Situation, Place  Normal Incontinent Weight: 69.2 kg Height:  5\' 10"  (177.8 cm)  BEHAVIORAL SYMPTOMS/MOOD NEUROLOGICAL BOWEL NUTRITION STATUS      Continent Diet(regular)  AMBULATORY STATUS COMMUNICATION OF NEEDS Skin   Independent(baseline) Verbally Normal                       Personal Care Assistance Level of Assistance  Bathing, Feeding, Dressing Bathing Assistance: (baseline) Feeding assistance: (baseline) Dressing Assistance: (baseline)  Functional Limitations Info  Sight, Hearing, Speech Sight Info: Adequate Hearing Info: Impaired Speech Info: Adequate    SPECIAL CARE FACTORS FREQUENCY                        Contractures Contractures Info: Not present    Additional Factors Info  Code Status, Allergies, Insulin Sliding Scale Code Status Info: DNR Allergies Info: Colchicine, Hibiclens Chlorhexidine, Atorvastatin, Ceftin, Celebrex (Celecoxib), Codeine, Erythromycin, Ezetimibe, Iodinated Diagnostic Agents, Oxycodone-acetaminophen, Rosuvastatin, Simvastatin, Cefpodoxime, Cefuroxime Axetil, Nitroglycerin, Tetanus Toxoid, Vantin   Insulin Sliding Scale Info: novolog 3x/day with meals and at bedtime       Current Medications (03/11/2018):  This is the current hospital active medication list Current Facility-Administered Medications  Medication Dose Route Frequency Provider Last Rate Last Dose  . acetaminophen (TYLENOL) tablet 650 mg  650 mg Oral Q4H PRN Karmen Bongo, MD      . alfuzosin (UROXATRAL) 24 hr tablet 10 mg  10 mg Oral Ivery Quale, MD   10 mg at 03/10/18 2227  . apixaban (ELIQUIS) tablet 2.5 mg  2.5 mg Oral BID Karmen Bongo, MD   2.5 mg at 03/10/18 1809  . brimonidine (ALPHAGAN) 0.2 % ophthalmic solution 1 drop  1 drop Both Eyes BID Karmen Bongo, MD   1 drop at 03/10/18 2228   And  . timolol (TIMOPTIC) 0.5 % ophthalmic solution 1 drop  1 drop Both Eyes BID Karmen Bongo, MD   1 drop at 03/10/18 2227  . brinzolamide (AZOPT) 1 % ophthalmic suspension 1 drop  1 drop Right Eye TID Karmen Bongo, MD   1 drop at 03/10/18 2226  . carbidopa-levodopa (SINEMET IR) 25-100 MG per tablet immediate release 1 tablet  1 tablet Oral 3 times per day Karmen Bongo, MD   1 tablet at 03/11/18 0901  . clopidogrel (PLAVIX) tablet 75 mg  75 mg Oral Daily Dorothy Spark, MD   75 mg at 03/11/18 0858  . colesevelam [facility]) tablet 1,875 mg  1,875 mg Oral Daily Karmen Bongo, MD   1,875 mg at 03/10/18 1346  . diltiazem (CARDIZEM CD) 24 hr capsule 120 mg  120 mg Oral Daily Dorothy Spark, MD   120 mg at 03/11/18 0856  . docusate sodium (COLACE) capsule 100 mg  100 mg Oral BID Karmen Bongo, MD   100 mg at 03/11/18 0855  . gabapentin (NEURONTIN) capsule 100 mg  100 mg Oral Ivery Quale, MD   100 mg at 03/10/18 2227  . guaiFENesin (MUCINEX) 12 hr tablet 600 mg  600 mg Oral BID Karmen Bongo, MD   600 mg at 03/11/18 0855  . insulin aspart (novoLOG) injection 0-15 Units  0-15 Units Subcutaneous TID WC Karmen Bongo, MD   2 Units at 03/11/18 0902  . insulin aspart (novoLOG) injection 0-5 Units  0-5 Units Subcutaneous QHS Karmen Bongo, MD      . latanoprost (XALATAN) 0.005 % ophthalmic solution 1 drop  1 drop Right Eye Ivery Quale, MD   1 drop at 03/10/18 2229  . levothyroxine (SYNTHROID, LEVOTHROID) tablet 175 mcg  175 mcg Oral QAC breakfast Karmen Bongo, MD   175 mcg at 03/11/18 0540  . Melatonin TABS 3 mg  3 mg Oral QHS Karmen Bongo, MD   3 mg at 03/10/18 2230  . metoprolol succinate (TOPROL-XL) 24 hr tablet 12.5 mg  12.5 mg Oral Daily Kathyrn Drown D, NP   12.5 mg at 03/10/18 1808  . mirabegron  ER (MYRBETRIQ) tablet 50 mg  50 mg Oral Daily Karmen Bongo, MD   50 mg at 03/11/18 0856  . ondansetron (ZOFRAN) injection 4 mg  4 mg Intravenous Q6H PRN Karmen Bongo, MD      . pantoprazole (PROTONIX) EC tablet 40 mg  40 mg Oral Daily Karmen Bongo, MD   40 mg at 03/11/18 0855  . polyethylene glycol (MIRALAX / GLYCOLAX) packet 17 g  17 g Oral Daily Karmen Bongo, MD   17 g at 03/11/18 0900  . polyvinyl alcohol (LIQUIFILM TEARS) 1.4 % ophthalmic solution 1 drop  1 drop Both Eyes QID Karmen Bongo, MD   1 drop at 03/10/18 2228  . traMADol (ULTRAM) tablet 25 mg  25 mg Oral BID PRN Karmen Bongo, MD      . zolpidem Lorrin Mais) tablet 2.5 mg  2.5 mg Oral QHS PRN Karmen Bongo, MD         Discharge Medications: Please see discharge summary for a list of discharge medications.  Relevant Imaging Results:  Relevant Lab Results:   Additional Information SSN: [id number]  Estanislado Emms, LCSW

## 2018-03-11 NOTE — Discharge Instructions (Signed)

## 2018-03-11 NOTE — Progress Notes (Signed)
  Echocardiogram 2D Echocardiogram has been performed.  [name] 03/11/2018, 8:40 AM

## 2018-03-30 ENCOUNTER — Ambulatory Visit: Payer: Medicare Other | Admitting: Physician Assistant

## 2018-04-14 ENCOUNTER — Telehealth: Payer: Self-pay | Admitting: Cardiology

## 2018-04-14 NOTE — Telephone Encounter (Signed)
Patient don't have mychart or computer only phone.

## 2018-04-21 NOTE — Telephone Encounter (Signed)
TELEPHONE CALL NOTE  This patient has been deemed a candidate for follow-up tele-health visit to limit community exposure during the Covid-19 pandemic. I spoke with the patient via phone to discuss instructions. This has been outlined on the patient's AVS (dotphrase: hcevisitinfo). The patient was advised to review the section on consent for treatment as well. The patient will receive a phone call 2-3 days prior to their E-Visit at which time consent will be verbally confirmed. A Virtual Office Visit appointment type has been scheduled for 04/25/2018 at 8:30 am with Dr Radford Pax, with "VIDEO" or "TELEPHONE" in the appointment notes - patient prefers TELEPHONE type.  I have either confirmed the patient is active in MyChart or offered to send sign-up link to phone/email via Mychart icon beside patient's photo.Not active/no internet  [name], [facility] 04/21/2018 4:40 PM      Virtual Visit Pre-Appointment Phone Call  TELEPHONE CALL NOTE  [name] has been deemed a candidate for a follow-up tele-health visit to limit community exposure during the Covid-19 pandemic. I spoke with the patient via phone to ensure availability of phone/video source, confirm preferred email & phone number, and discuss instructions and expectations.  I reminded [name] to be prepared with any vital sign and/or heart rhythm information that could potentially be obtained via home monitoring, at the time of his visit. I reminded [name] to expect a phone call at the time of his visit if his visit.  Did the patient verbally acknowledge consent to treatment? YES  [name], CMA 04/21/2018 4:41 PM  CONSENT FOR TELE-HEALTH VISIT - PLEASE REVIEW  I hereby voluntarily request, consent and authorize [facility] and its employed or contracted physicians, physician assistants, nurse practitioners or other licensed health care professionals (the Practitioner), to provide me with telemedicine health care  services (the "Services") as deemed necessary by the treating Practitioner. I acknowledge and consent to receive the Services by the Practitioner via telemedicine. I understand that the telemedicine visit will involve communicating with the Practitioner through live audiovisual communication technology and the disclosure of certain medical information by electronic transmission. I acknowledge that I have been given the opportunity to request an in-person assessment or other available alternative prior to the telemedicine visit and am voluntarily participating in the telemedicine visit.  I understand that I have the right to withhold or withdraw my consent to the use of telemedicine in the course of my care at any time, without affecting my right to future care or treatment, and that the Practitioner or I may terminate the telemedicine visit at any time. I understand that I have the right to inspect all information obtained and/or recorded in the course of the telemedicine visit and may receive copies of available information for a reasonable fee.  I understand that some of the potential risks of receiving the Services via telemedicine include:  Marland Kitchen Delay or interruption in medical evaluation due to technological equipment failure or disruption; . Information transmitted may not be sufficient (e.g. poor resolution of images) to allow for appropriate medical decision making by the Practitioner; and/or  . In rare instances, security protocols could fail, causing a breach of personal health information.  Furthermore, I acknowledge that it is my responsibility to provide information about my medical history, conditions and care that is complete and accurate to the best of my ability. I acknowledge that Practitioner's advice, recommendations, and/or decision may be based on factors not within their control, such as incomplete or inaccurate  data provided by me or distortions of diagnostic images or specimens that may  result from electronic transmissions. I understand that the practice of medicine is not an exact science and that Practitioner makes no warranties or guarantees regarding treatment outcomes. I acknowledge that I will receive a copy of this consent concurrently upon execution via email to the email address I last provided but may also request a printed copy by calling the office of [facility].    I understand that my insurance will be billed for this visit.   I have read or had this consent read to me. . I understand the contents of this consent, which adequately explains the benefits and risks of the Services being provided via telemedicine.  . I have been provided ample opportunity to ask questions regarding this consent and the Services and have had my questions answered to my satisfaction. . I give my informed consent for the services to be provided through the use of telemedicine in my medical care  By participating in this telemedicine visit I agree to the above.

## 2018-04-24 ENCOUNTER — Encounter: Payer: Self-pay | Admitting: Cardiology

## 2018-04-24 DIAGNOSIS — I480 Paroxysmal atrial fibrillation: Secondary | ICD-10-CM | POA: Insufficient documentation

## 2018-04-24 NOTE — Progress Notes (Signed)
Virtual Visit with video Note   This visit type was conducted due to national recommendations for restrictions regarding the COVID-19 Pandemic (e.g. social distancing) in an effort to limit this patient's exposure and mitigate transmission in our community.  Due to his co-morbid illnesses, this patient is at least at moderate risk for complications without adequate follow up.  This format is felt to be most appropriate for this patient at this time.  All issues noted in this document were discussed and addressed.  A limited physical exam was performed with this format.  Please refer to the patient's chart for his consent to telehealth for [facility].  Evaluation Performed:  Follow-up visit  This visit type was conducted due to national recommendations for restrictions regarding the COVID-19 Pandemic (e.g. social distancing).  This format is felt to be most appropriate for this patient at this time.  All issues noted in this document were discussed and addressed.  No physical exam was performed (except for noted visual exam findings with Video Visits).  Please refer to the patient's chart (MyChart message for video visits and phone note for telephone visits) for the patient's consent to telehealth for [facility].  Date:  04/25/2018   ID:  [name], DOB [birth year], MRN [id number]  Patient Location:  Home  Provider location:   [place]  PCP:  Lajean Manes, MD  Cardiologist:  Fransico Him, MD  Electrophysiologist:  None   Chief Complaint:  Followup of CAD, HTN, hyperlipidemia and PAF  History of Present Illness: He   [name] is a 85 y.o. male who presents via audio/video conferencing for a telehealth visit today.    This is an 85yo male with a history of type 2 DM, HTN, CKD, CVA, prostate CA and Parkinson's dz who was admitted 07/2017 with STEMI and cath showed 25% mRCA, 70% oOM1, 80% mLCx and underwent PCI with DES to the LCx. 2D echo showed normal LVF with EF  55-60% with G2DD rheumatic dx of the anterior MVL with mild thickening and moderately dilated LA.  He is now on DAPT.  He is intolerant to statins.   He was readmitted 08/2017 with fever and left hip pain and CT showed right hydronephrosis with nephrolithiasis and underwent cycstocopy and stent placement.  His hospitalization was complicated by afib with RVR and converted on Caredizem.  He was continue on ASA and Brilinta and no DOAC as this occurred in the setting of acute infection and fever.    He was readmitted again 03/11/2018 with chest pain while going to the bathroom.  He was noted to be in afib with RVR by EMS.  He stated that he rides a stationary bike for 15 minutes without CP.  He could feel the palpitations during the episode of CP.  Trop was elevated but felt related to demand ischemia.  He was started on Eliquis 2.5mg  BID (CHADS2VASC score 8) and Cardizem was added to his BB.  His ASA was stopped and he was transitioned to Plavix from [place].    He is here today for followup and is doing well.  He denies any chest pain or pressure, SOB, DOE, PND, orthopnea, LE edema, dizziness, palpitations or syncope. He is compliant with his meds and is tolerating meds with no SE.    The patient does not have symptoms concerning for COVID-19 infection (fever, chills, cough, or new shortness of breath).    Prior CV studies:   The following studies were reviewed today:  Cardiac cath 07/2017, 2D echo 02/2018  Past Medical History:  Diagnosis Date  . Allergic rhinitis   . Arthritis    arthritis-kyphosis  . Asthmatic bronchitis   . CKD (chronic kidney disease) stage 3, GFR 30-59 ml/min (HCC)   . DM type 2 (diabetes mellitus, type 2) ([place])   . DVT, lower extremity, proximal, acute ([place])   . Dyslipidemia   . GERD (gastroesophageal reflux disease)   . Hearing impaired    bilateral hearing aids  . History of kidney stones   . History of skin cancer   . Hypertension   . Hypothyroidism   .  Macular degeneration    injections done bilaterally recent - 08-22-13  . PAF (paroxysmal atrial fibrillation) ([place])   . Prostate cancer [facility])    Prostate cancer-radiation only,skin cancer-basal cell and last squamous cell right eye  . Stroke ([place])   . Urgency-frequency syndrome   . Urticaria    Past Surgical History:  Procedure Laterality Date  . BACK SURGERY     lumbar fracture  . cataract surgery Bilateral   . COLONOSCOPY W/ POLYPECTOMY     x2   . CORONARY STENT INTERVENTION N/A 08/06/2017   Procedure: CORONARY STENT INTERVENTION;  Surgeon: Jettie Booze, MD;  Location: [place] [facility];  Service: Cardiovascular;  Laterality: N/A;  . CYSTOSCOPY W/ URETERAL STENT PLACEMENT Right 08/13/2017   Procedure: CYSTOSCOPY WITH RIGHT  RETROGRADE PYELOGRAM/URETERAL STENT PLACEMENT;  Surgeon: Raynelle Bring, MD;  Location: [facility];  Service: Urology;  Laterality: Right;  . CYSTOSCOPY WITH INJECTION N/A 09/11/2013   Procedure: CYSTOSCOPY WITH BOTOX INJECTION INTO THE BLADDER;  Surgeon: Ailene Rud, MD;  Location: [facility];  Service: Urology;  Laterality: N/A;  . CYSTOSCOPY WITH RETROGRADE PYELOGRAM, URETEROSCOPY AND STENT PLACEMENT    . CYSTOSCOPY WITH STENT PLACEMENT Right 01/26/2016   Procedure: CYSTOSCOPY, RIGHT RETROGRADE WITH RIGHT URETERAL STENT PLACEMENT;  Surgeon: Cleon Gustin, MD;  Location: [facility];  Service: Urology;  Laterality: Right;  . CYSTOSCOPY/RETROGRADE/URETEROSCOPY Right 04/22/2016   Procedure: CYSTOSCOPY/RETROGRADE/ REMOVAL JJ STENT right insertion stent right insertion of foley;  Surgeon: Franchot Gallo, MD;  Location: [facility];  Service: Urology;  Laterality: Right;  . EXTRACORPOREAL SHOCK WAVE LITHOTRIPSY Right 02/27/2016   Procedure: RIGHT EXTRACORPOREAL SHOCK WAVE LITHOTRIPSY (ESWL) GAITED;  Surgeon: Cleon Gustin, MD;  Location: [facility];  Service: Urology;  Laterality: Right;  . EXTRACORPOREAL SHOCK WAVE LITHOTRIPSY Right 02/10/2016   Procedure:  EXTRACORPOREAL SHOCK WAVE LITHOTRIPSY (ESWL);  Surgeon: Kathie Rhodes, MD;  Location: [facility];  Service: Urology;  Laterality: Right;  [phone] [id number]- [id number]   . EYE SURGERY     macular degeneration  . GALLBLADDER SURGERY  2006   no cholecystectomy  . HERNIA REPAIR    . LEFT HEART CATH AND CORONARY ANGIOGRAPHY N/A 08/06/2017   Procedure: LEFT HEART CATH AND CORONARY ANGIOGRAPHY;  Surgeon: Jettie Booze, MD;  Location: [place] [facility];  Service: Cardiovascular;  Laterality: N/A;  . LUNG REMOVAL, PARTIAL  age 36   for bronchiectasis  . PARTIAL THYMECTOMY  1985   for nodules  . POPLITEAL SYNOVIAL CYST EXCISION  2005  . TOTAL KNEE ARTHROPLASTY  2008   right  . TOTAL KNEE ARTHROPLASTY  2010   left     Current Meds  Medication Sig  . albuterol (PROAIR HFA) 108 (90 Base) MCG/ACT inhaler Inhale 2 puffs into the lungs every 8 (eight) hours as needed for shortness of breath.   Marland Kitchen  alfuzosin (UROXATRAL) 10 MG 24 hr tablet Take 1 tablet (10 mg total) by mouth at bedtime.  Marland Kitchen apixaban (ELIQUIS) 2.5 MG TABS tablet Take 1 tablet (2.5 mg total) by mouth 2 (two) times daily for 30 days.  . brimonidine-timolol (COMBIGAN) 0.2-0.5 % ophthalmic solution Place 1 drop into both eyes 2 (two) times daily.  . brinzolamide (AZOPT) 1 % ophthalmic suspension Place 1 drop into the right eye 3 (three) times daily.   . carbidopa-levodopa (SINEMET IR) 25-100 MG tablet Take 1 tablet by mouth See admin instructions. Take 1 tablet by mouth three times a day- 8 AM, 2 PM, and 8 PM  . Cholecalciferol (VITAMIN D-3) 5000 units TABS Take 5,000 Units by mouth daily.  . clopidogrel (PLAVIX) 75 MG tablet Take 75 mg by mouth daily.  . colesevelam (WELCHOL) 625 MG tablet Take 1,875 mg by mouth daily.   Marland Kitchen diltiazem (CARDIZEM CD) 120 MG 24 hr capsule Take 1 capsule (120 mg total) by mouth daily for 30 days.  Marland Kitchen docusate sodium (COLACE) 100 MG capsule Take 100 mg by mouth 2 (two)  times daily.   Marland Kitchen gabapentin (NEURONTIN) 100 MG capsule Take 100 mg by mouth at bedtime.  Marland Kitchen guaiFENesin (MUCINEX) 600 MG 12 hr tablet Take 600 mg by mouth 2 (two) times daily.   . hydroxypropyl methylcellulose / hypromellose (ISOPTO TEARS / GONIOVISC) 2.5 % ophthalmic solution Place 1 drop into both eyes 4 (four) times daily.  Marland Kitchen latanoprost (XALATAN) 0.005 % ophthalmic solution Place 1 drop into the right eye at bedtime.   Marland Kitchen levothyroxine (SYNTHROID, LEVOTHROID) 175 MCG tablet Take 175 mcg by mouth daily before breakfast.  . Melatonin 3 MG TABS Take 3 mg by mouth at bedtime.  . Meth-Hyo-M Bl-Na Phos-Ph Sal (URIBEL) 118 MG CAPS Take 118 mg by mouth 3 (three) times daily.   . metoprolol succinate (TOPROL-XL) 25 MG 24 hr tablet Take 0.5 tablets (12.5 mg total) by mouth daily for 30 days.  . mirabegron ER (MYRBETRIQ) 50 MG TB24 tablet Take 1 tablet (50 mg total) by mouth daily.  . Multiple Vitamins-Minerals (PRESERVISION/LUTEIN) CAPS Take 1 capsule by mouth 2 (two) times daily.  Marland Kitchen omeprazole (PRILOSEC) 40 MG capsule Take 40 mg by mouth daily.  . polyethylene glycol (MIRALAX / GLYCOLAX) packet Take 17 g by mouth daily. [place]  . potassium chloride (K-DUR,KLOR-CON) 10 MEQ tablet Take 10 mEq by mouth daily.  . sitaGLIPtin (JANUVIA) 50 MG tablet Take 1 tablet (50 mg total) by mouth daily.  . traMADol (ULTRAM) 50 MG tablet Take 25 mg by mouth 2 (two) times daily as needed (for pain).   Marland Kitchen zolpidem (AMBIEN) 5 MG tablet Take 0.5 tablets (2.5 mg total) by mouth at bedtime as needed for sleep.     Allergies:   Colchicine; Hibiclens [chlorhexidine]; Atorvastatin; Ceftin; Celebrex [celecoxib]; Codeine; Erythromycin; Ezetimibe; Iodinated diagnostic agents; Oxycodone-acetaminophen; Rosuvastatin; Simvastatin; Cefpodoxime; Cefuroxime axetil; Nitroglycerin; Tetanus toxoid; and Vantin   Social History   Tobacco Use  . Smoking status: Never Smoker  . Smokeless tobacco: Never Used  Substance Use Topics  .  Alcohol use: No  . Drug use: No     Family Hx: The patient's family history includes Heart attack in his father; Heart disease in his father; Hypertension in his mother; Other in his sister; Stroke (age of onset: 57) in his mother. There is no history of Amblyopia, Blindness, Glaucoma, Macular degeneration, Retinal detachment, Strabismus, Retinitis pigmentosa, or Cataracts.  ROS:   Please see the  history of present illness.     All other systems reviewed and are negative.   Labs/Other Tests and Data Reviewed:    Recent Labs: 08/04/2017: B Natriuretic Peptide 431.0 03/10/2018: ALT <5; TSH 0.184 03/11/2018: BUN 24; Creatinine, Ser 1.51; Hemoglobin 9.2; Platelets 205; Potassium 3.8; Sodium 137   Recent Lipid Panel Lab Results  Component Value Date/Time   CHOL 165 03/11/2018 03:58 AM   CHOL 148 10/20/2017 08:47 AM   TRIG 65 03/11/2018 03:58 AM   HDL 40 (L) 03/11/2018 03:58 AM   HDL 46 10/20/2017 08:47 AM   CHOLHDL 4.1 03/11/2018 03:58 AM   LDLCALC 112 (H) 03/11/2018 03:58 AM   LDLCALC 90 10/20/2017 08:47 AM   LDLDIRECT 153.9 04/02/2011 08:05 AM    Wt Readings from Last 3 Encounters:  04/25/18 157 lb 12.8 oz (71.6 kg)  03/11/18 152 lb 9.6 oz (69.2 kg)  10/19/17 152 lb (68.9 kg)     Objective:    Vital Signs:  BP 126/68   Pulse (!) 55   Resp 19   Ht 5\' 10"  (1.778 m)   Wt 157 lb 12.8 oz (71.6 kg)   BMI 22.64 kg/m    CONSTITUTIONAL:  Well nourished, well developed male in no acute distress.  EYES: anicteric MOUTH: oral mucosa is pink RESPIRATORY: Normal respiratory effort, symmetric expansion CARDIOVASCULAR: No peripheral edema SKIN: No rash, lesions or ulcers MUSCULOSKELETAL: no digital cyanosis NEURO: Cranial Nerves II-XII grossly intact, moves all extremities PSYCH: Intact judgement and insight.  A&O x 3, Mood/affect appropriate   ASSESSMENT & PLAN:    1.  ASCAD - He is s/p STEMI with PCI of LCx 07/2017. He is no longer on DAPT due to need for DOAC for PAF.  He  has not had any further CP since his last admission with afib with RVR.  He rides an exercise bike without any problems.  He will continue on Plavix 75mg  daily and BB.  2.  HTN - He checks his BP at home which is well controlled.  He will continue on Toprol XL 12.5mg  daily and Cardizem CD 120mg  daily.    3.  Paroxysmal atrial fibrillation - he has a CHADS2VASC score of 8 and is on Eliquis 2.5mg  BID (age>80 and Creatinine 1.51). His last creatinine was 1.51 on 03/11/2018.  He has not had any further palpitations since last hospitalization 02/2018.  He will continue on Toprol XL 12.5mg  daily and Cardizem CD 120mg  daily.   4.  Hyperlipidemia - his LDL goal is < 70.  He is statin intolerant. He is statin intolerant and due to [place] crisis and advanced age will not refer for PCSK9i at this time.  He will continue on Welchol.   5.  CKD stage 3 - his last creatinine was 1.51.  This is followed by he PCP.  6.  Type 2 DM - his last HbA1C was 6.3 in July 2019.  This is followed by his PCP.  He will continue on Januvia.   COVID-19 Education: The signs and symptoms of COVID-19 were discussed with the patient and how to seek care for testing (follow up with PCP or arrange E-visit).  The importance of social distancing was discussed today.  Patient Risk:   After full review of this patient's clinical status, I feel that they are at least moderate risk at this time.  Time:   Today, I have spent 25 minutes with the patient reviewing chart and discussing medical problems including CAD, HTN, hyperlipidemia and PAF  and reviewing symptoms of COVID 19 and the ways to protect against contracting the virus with telehealth technology.      Medication Adjustments/Labs and Tests Ordered: Current medicines are reviewed at length with the patient today.  Concerns regarding medicines are outlined above.  Tests Ordered: No orders of the defined types were placed in this encounter.  Medication Changes: No orders of the  defined types were placed in this encounter.   Disposition:  Follow up in 6 month(s)  Signed, Fransico Him, MD  04/25/2018 10:45 AM    [facility]

## 2018-04-25 ENCOUNTER — Encounter: Payer: Self-pay | Admitting: Cardiology

## 2018-04-25 ENCOUNTER — Telehealth (INDEPENDENT_AMBULATORY_CARE_PROVIDER_SITE_OTHER): Payer: Medicare Other | Admitting: Cardiology

## 2018-04-25 ENCOUNTER — Other Ambulatory Visit: Payer: Self-pay

## 2018-04-25 VITALS — BP 126/68 | HR 55 | Resp 19 | Ht 70.0 in | Wt 157.8 lb

## 2018-04-25 DIAGNOSIS — I480 Paroxysmal atrial fibrillation: Secondary | ICD-10-CM

## 2018-04-25 DIAGNOSIS — Z794 Long term (current) use of insulin: Secondary | ICD-10-CM

## 2018-04-25 DIAGNOSIS — N183 Chronic kidney disease, stage 3 unspecified: Secondary | ICD-10-CM

## 2018-04-25 DIAGNOSIS — I2510 Atherosclerotic heart disease of native coronary artery without angina pectoris: Secondary | ICD-10-CM | POA: Diagnosis not present

## 2018-04-25 DIAGNOSIS — E119 Type 2 diabetes mellitus without complications: Secondary | ICD-10-CM | POA: Diagnosis not present

## 2018-04-25 DIAGNOSIS — E785 Hyperlipidemia, unspecified: Secondary | ICD-10-CM

## 2018-04-25 DIAGNOSIS — I10 Essential (primary) hypertension: Secondary | ICD-10-CM | POA: Diagnosis not present

## 2018-04-25 DIAGNOSIS — IMO0001 Reserved for inherently not codable concepts without codable children: Secondary | ICD-10-CM

## 2018-04-25 DIAGNOSIS — Z7189 Other specified counseling: Secondary | ICD-10-CM

## 2018-04-25 NOTE — Patient Instructions (Signed)
Medication Instructions:  Your physician recommends that you continue on your current medications as directed. Please refer to the Current Medication list given to you today.  If you need a refill on your cardiac medications before your next appointment, please call your pharmacy.   Lab work: None If you have labs (blood work) drawn today and your tests are completely normal, you will receive your results only by: . MyChart Message (if you have MyChart) OR . A paper copy in the mail If you have any lab test that is abnormal or we need to change your treatment, we will call you to review the results.  Testing/Procedures: None  Follow-Up: At [facility], you and your health needs are our priority.  As part of our continuing mission to provide you with exceptional heart care, we have created designated Provider Care Teams.  These Care Teams include your primary Cardiologist (physician) and Advanced Practice Providers (APPs -  Physician Assistants and Nurse Practitioners) who all work together to provide you with the care you need, when you need it. You will need a follow up appointment in 6 months.  Please call our office 2 months in advance to schedule this appointment.  You may see Traci Turner, MD or one of the following Advanced Practice Providers on your designated Care Team:   Brittainy Simmons, PA-C Dayna Dunn, PA-C . Michele Lenze, PA-C     

## 2018-11-13 ENCOUNTER — Observation Stay (HOSPITAL_COMMUNITY)
Admission: EM | Admit: 2018-11-13 | Discharge: 2018-11-14 | Payer: Medicare Other | Attending: Family Medicine | Admitting: Family Medicine

## 2018-11-13 ENCOUNTER — Encounter (HOSPITAL_COMMUNITY): Payer: Self-pay

## 2018-11-13 ENCOUNTER — Emergency Department (HOSPITAL_COMMUNITY): Payer: Medicare Other

## 2018-11-13 ENCOUNTER — Other Ambulatory Visit: Payer: Self-pay

## 2018-11-13 DIAGNOSIS — M1990 Unspecified osteoarthritis, unspecified site: Secondary | ICD-10-CM | POA: Insufficient documentation

## 2018-11-13 DIAGNOSIS — Z87442 Personal history of urinary calculi: Secondary | ICD-10-CM | POA: Insufficient documentation

## 2018-11-13 DIAGNOSIS — I129 Hypertensive chronic kidney disease with stage 1 through stage 4 chronic kidney disease, or unspecified chronic kidney disease: Secondary | ICD-10-CM | POA: Diagnosis not present

## 2018-11-13 DIAGNOSIS — Z66 Do not resuscitate: Secondary | ICD-10-CM | POA: Diagnosis not present

## 2018-11-13 DIAGNOSIS — I480 Paroxysmal atrial fibrillation: Secondary | ICD-10-CM | POA: Insufficient documentation

## 2018-11-13 DIAGNOSIS — Z8546 Personal history of malignant neoplasm of prostate: Secondary | ICD-10-CM | POA: Insufficient documentation

## 2018-11-13 DIAGNOSIS — N183 Chronic kidney disease, stage 3 unspecified: Secondary | ICD-10-CM | POA: Insufficient documentation

## 2018-11-13 DIAGNOSIS — R079 Chest pain, unspecified: Secondary | ICD-10-CM | POA: Diagnosis present

## 2018-11-13 DIAGNOSIS — K219 Gastro-esophageal reflux disease without esophagitis: Secondary | ICD-10-CM | POA: Insufficient documentation

## 2018-11-13 DIAGNOSIS — R778 Other specified abnormalities of plasma proteins: Secondary | ICD-10-CM

## 2018-11-13 DIAGNOSIS — I2510 Atherosclerotic heart disease of native coronary artery without angina pectoris: Secondary | ICD-10-CM | POA: Diagnosis not present

## 2018-11-13 DIAGNOSIS — E785 Hyperlipidemia, unspecified: Secondary | ICD-10-CM | POA: Insufficient documentation

## 2018-11-13 DIAGNOSIS — Z86718 Personal history of other venous thrombosis and embolism: Secondary | ICD-10-CM | POA: Diagnosis not present

## 2018-11-13 DIAGNOSIS — E039 Hypothyroidism, unspecified: Secondary | ICD-10-CM | POA: Diagnosis present

## 2018-11-13 DIAGNOSIS — Z7984 Long term (current) use of oral hypoglycemic drugs: Secondary | ICD-10-CM | POA: Insufficient documentation

## 2018-11-13 DIAGNOSIS — Z885 Allergy status to narcotic agent status: Secondary | ICD-10-CM | POA: Insufficient documentation

## 2018-11-13 DIAGNOSIS — G20 Parkinson's disease: Secondary | ICD-10-CM | POA: Diagnosis not present

## 2018-11-13 DIAGNOSIS — D500 Iron deficiency anemia secondary to blood loss (chronic): Secondary | ICD-10-CM | POA: Diagnosis not present

## 2018-11-13 DIAGNOSIS — I679 Cerebrovascular disease, unspecified: Secondary | ICD-10-CM | POA: Diagnosis not present

## 2018-11-13 DIAGNOSIS — E1140 Type 2 diabetes mellitus with diabetic neuropathy, unspecified: Secondary | ICD-10-CM | POA: Diagnosis not present

## 2018-11-13 DIAGNOSIS — I248 Other forms of acute ischemic heart disease: Secondary | ICD-10-CM | POA: Diagnosis not present

## 2018-11-13 DIAGNOSIS — Z886 Allergy status to analgesic agent status: Secondary | ICD-10-CM | POA: Insufficient documentation

## 2018-11-13 DIAGNOSIS — E1122 Type 2 diabetes mellitus with diabetic chronic kidney disease: Secondary | ICD-10-CM | POA: Diagnosis not present

## 2018-11-13 DIAGNOSIS — J45909 Unspecified asthma, uncomplicated: Secondary | ICD-10-CM | POA: Insufficient documentation

## 2018-11-13 DIAGNOSIS — E1129 Type 2 diabetes mellitus with other diabetic kidney complication: Secondary | ICD-10-CM | POA: Diagnosis present

## 2018-11-13 DIAGNOSIS — Z79890 Hormone replacement therapy (postmenopausal): Secondary | ICD-10-CM | POA: Insufficient documentation

## 2018-11-13 DIAGNOSIS — Z20828 Contact with and (suspected) exposure to other viral communicable diseases: Secondary | ICD-10-CM | POA: Diagnosis not present

## 2018-11-13 DIAGNOSIS — N401 Enlarged prostate with lower urinary tract symptoms: Secondary | ICD-10-CM | POA: Diagnosis not present

## 2018-11-13 DIAGNOSIS — I4891 Unspecified atrial fibrillation: Secondary | ICD-10-CM

## 2018-11-13 DIAGNOSIS — Z881 Allergy status to other antibiotic agents status: Secondary | ICD-10-CM | POA: Insufficient documentation

## 2018-11-13 DIAGNOSIS — E890 Postprocedural hypothyroidism: Secondary | ICD-10-CM | POA: Diagnosis not present

## 2018-11-13 DIAGNOSIS — Z955 Presence of coronary angioplasty implant and graft: Secondary | ICD-10-CM | POA: Insufficient documentation

## 2018-11-13 DIAGNOSIS — R338 Other retention of urine: Secondary | ICD-10-CM | POA: Insufficient documentation

## 2018-11-13 DIAGNOSIS — Z85828 Personal history of other malignant neoplasm of skin: Secondary | ICD-10-CM | POA: Diagnosis not present

## 2018-11-13 DIAGNOSIS — D649 Anemia, unspecified: Secondary | ICD-10-CM | POA: Diagnosis present

## 2018-11-13 DIAGNOSIS — I252 Old myocardial infarction: Secondary | ICD-10-CM | POA: Insufficient documentation

## 2018-11-13 DIAGNOSIS — Z8673 Personal history of transient ischemic attack (TIA), and cerebral infarction without residual deficits: Secondary | ICD-10-CM | POA: Insufficient documentation

## 2018-11-13 DIAGNOSIS — Z79899 Other long term (current) drug therapy: Secondary | ICD-10-CM | POA: Insufficient documentation

## 2018-11-13 DIAGNOSIS — Z96653 Presence of artificial knee joint, bilateral: Secondary | ICD-10-CM | POA: Insufficient documentation

## 2018-11-13 DIAGNOSIS — F0280 Dementia in other diseases classified elsewhere without behavioral disturbance: Secondary | ICD-10-CM | POA: Diagnosis not present

## 2018-11-13 DIAGNOSIS — Z7901 Long term (current) use of anticoagulants: Secondary | ICD-10-CM | POA: Insufficient documentation

## 2018-11-13 DIAGNOSIS — Z87892 Personal history of anaphylaxis: Secondary | ICD-10-CM | POA: Insufficient documentation

## 2018-11-13 DIAGNOSIS — D638 Anemia in other chronic diseases classified elsewhere: Secondary | ICD-10-CM | POA: Diagnosis present

## 2018-11-13 DIAGNOSIS — Z8249 Family history of ischemic heart disease and other diseases of the circulatory system: Secondary | ICD-10-CM | POA: Insufficient documentation

## 2018-11-13 DIAGNOSIS — R7989 Other specified abnormal findings of blood chemistry: Secondary | ICD-10-CM | POA: Insufficient documentation

## 2018-11-13 LAB — COMPREHENSIVE METABOLIC PANEL
ALT: <5 U/L (ref 0–44)
AST: 9 U/L — ABNORMAL LOW (ref 15–41)
Albumin: 3.1 g/dL — ABNORMAL LOW (ref 3.5–5.0)
Alkaline Phosphatase: 61 U/L (ref 38–126)
Anion gap: 9 (ref 5–15)
BUN: 24 mg/dL — ABNORMAL HIGH (ref 8–23)
CO2: 22 mmol/L (ref 22–32)
Calcium: 8.4 mg/dL — ABNORMAL LOW (ref 8.9–10.3)
Chloride: 107 mmol/L (ref 98–111)
Creatinine, Ser: 1.32 mg/dL — ABNORMAL HIGH (ref 0.61–1.24)
GFR calc Af Amer: 57 mL/min — ABNORMAL LOW (ref >60)
GFR calc non Af Amer: 49 mL/min — ABNORMAL LOW (ref >60)
Glucose, Bld: 124 mg/dL — ABNORMAL HIGH (ref 70–99)
Potassium: 4.3 mmol/L (ref 3.5–5.1)
Sodium: 138 mmol/L (ref 135–145)
Total Bilirubin: 0.5 mg/dL (ref 0.3–1.2)
Total Protein: 5.5 g/dL — ABNORMAL LOW (ref 6.5–8.1)

## 2018-11-13 LAB — MAGNESIUM: Magnesium: 2.1 mg/dL (ref 1.7–2.4)

## 2018-11-13 LAB — CBC
HCT: 23.4 % — ABNORMAL LOW (ref 39.0–52.0)
Hemoglobin: 6.8 g/dL — CL (ref 13.0–17.0)
MCH: 21.8 pg — ABNORMAL LOW (ref 26.0–34.0)
MCHC: 29.1 g/dL — ABNORMAL LOW (ref 30.0–36.0)
MCV: 75 fL — ABNORMAL LOW (ref 80.0–100.0)
Platelets: 255 10*3/uL (ref 150–400)
RBC: 3.12 MIL/uL — ABNORMAL LOW (ref 4.22–5.81)
RDW: 15.9 % — ABNORMAL HIGH (ref 11.5–15.5)
WBC: 6.7 10*3/uL (ref 4.0–10.5)
nRBC: 0 % (ref 0.0–0.2)

## 2018-11-13 LAB — PREPARE RBC (CROSSMATCH)

## 2018-11-13 LAB — POC OCCULT BLOOD, ED: Fecal Occult Bld: NEGATIVE

## 2018-11-13 LAB — TROPONIN I (HIGH SENSITIVITY)
Troponin I (High Sensitivity): 11 ng/L (ref <18)
Troponin I (High Sensitivity): 22 ng/L — ABNORMAL HIGH (ref <18)

## 2018-11-13 LAB — ABO/RH: ABO/RH(D): O POS

## 2018-11-13 LAB — SARS CORONAVIRUS 2 (TAT 6-24 HRS): SARS Coronavirus 2: NEGATIVE

## 2018-11-13 LAB — HEMOGLOBIN AND HEMATOCRIT, BLOOD
HCT: 28.9 % — ABNORMAL LOW (ref 39.0–52.0)
Hemoglobin: 8.4 g/dL — ABNORMAL LOW (ref 13.0–17.0)

## 2018-11-13 MED ORDER — SODIUM CHLORIDE 0.9% FLUSH
3.0000 mL | Freq: Two times a day (BID) | INTRAVENOUS | Status: DC
  Administered 2018-11-13 – 2018-11-14 (×2): 3 mL via INTRAVENOUS

## 2018-11-13 MED ORDER — DILTIAZEM HCL-DEXTROSE 100-5 MG/100ML-% IV SOLN (PREMIX)
5.0000 mg/h | INTRAVENOUS | Status: DC
  Administered 2018-11-13: 5 mg/h via INTRAVENOUS
  Filled 2018-11-13: qty 100

## 2018-11-13 MED ORDER — ONDANSETRON HCL 4 MG PO TABS: 4.0000 mg | ORAL_TABLET | Freq: Four times a day (QID) | ORAL | Status: DC | PRN

## 2018-11-13 MED ORDER — SODIUM CHLORIDE 0.9 % IV SOLN: Freq: Once | INTRAVENOUS | Status: DC

## 2018-11-13 MED ORDER — ALBUTEROL SULFATE (2.5 MG/3ML) 0.083% IN NEBU: 2.5000 mg | INHALATION_SOLUTION | Freq: Four times a day (QID) | RESPIRATORY_TRACT | Status: DC | PRN

## 2018-11-13 MED ORDER — ACETAMINOPHEN 325 MG PO TABS
650.0000 mg | ORAL_TABLET | Freq: Four times a day (QID) | ORAL | Status: DC | PRN
  Filled 2018-11-13: qty 2

## 2018-11-13 MED ORDER — ACETAMINOPHEN 650 MG RE SUPP: 650.0000 mg | Freq: Four times a day (QID) | RECTAL | Status: DC | PRN

## 2018-11-13 MED ORDER — SODIUM CHLORIDE 0.9 % IV SOLN: 10.0000 mL/h | Freq: Once | INTRAVENOUS | Status: DC

## 2018-11-13 MED ORDER — ONDANSETRON HCL 4 MG/2ML IJ SOLN: 4.0000 mg | Freq: Four times a day (QID) | INTRAMUSCULAR | Status: DC | PRN

## 2018-11-13 MED ORDER — SODIUM CHLORIDE 0.9 % IV SOLN
Freq: Once | INTRAVENOUS | Status: AC
  Administered 2018-11-13: 11:00:00 via INTRAVENOUS

## 2018-11-13 NOTE — ED Triage Notes (Signed)
Pt arrives after being called out from [place] after developing chest tightness while on the bike. Found to be in uncontrolled Afib with rates in the 160's-140's, given 5 of metroprolol PTA as well as regular daily meds but unknown what time. No complaints on arrival but HR up to 150's. Slightly dizzy with sitting up

## 2018-11-13 NOTE — ED Notes (Signed)
Given pt's current blood pressures, Dr Roderic Palau in lieu of Dr Patrick North start of [place]. When this RN went to scan diltazem the barcode read that there were no orders for the med although it was active in the [facility], pharmacist in room to manually verify that it was the correct medication in the correct dilution, okayed to start med per pharmacist.

## 2018-11-13 NOTE — ED Notes (Signed)
Pt was negative for stool blood

## 2018-11-13 NOTE — ED Notes (Signed)
Discussed starting cardizem with Dr Vanita Panda with pt's pressures in the 80's/60s. Per Dr Vanita Panda bolus pt's fluids to a total of 543mls and reeval blood pressure then

## 2018-11-13 NOTE — H&P (Addendum)
History and Physical    [name] [id number] DOB: 1931/10/01 DOA: 11/13/2018  Referring MD/NP/PA: Carmin Muskrat, MD PCP: Lajean Manes, MD  Patient coming from: [facility] via [facility]  Chief Complaint: Dizziness and chest  I have personally briefly reviewed patient's old medical records in [place]   HPI: [name] is a 85 y.o. male with medical history significant of paroxysmal A. fib on Eliquis, CKD stage III, DM type II, CVA, hypothyroidism, and Parkinson's disease presenting with complaints of dizziness and chest tightness while riding his bike this morning.  He reports that he did not necessarily feel his heart racing, but he felt weak and as though he could pass out.  Denies having any recent changes in any of his medications.  He has not had any dark or black stools to his knowledge.  His urine is always a greenish blue-colored due to the medications he is on.  Denies having any significant fevers, chest pain, cough, nausea, vomiting, or diarrhea symptoms.  En route with EMS the patient was found to be in atrial fibrillation with heart rates to the 160s and was given 5 mg of metoprolol prior to arrival in addition to his daily home medications.  ED Course: Upon admission into the emergency department patient was noted to be afebrile in  atrial fibrillation with heart rates 140s-160s and blood pressure systolic 0000000.  Labs significant for hemoglobin 6.8, BUN 24, creatinine 1.32, high-sensitivity troponin 22.  Patient was started normal saline at 125 mL/h tfor 1 L and started on a Cardizem drip.  Cardizem drip was unable to be tolerated up due to soft blood pressures.  Stool guaiacs were noted to be negative.  Case was discussed cardiology, but wanted to see how patient responded following transfusion of 1 unit of packed red blood cells.  Prior to going upstairs patient converted back to sinus rhythm.  TRH called to admit.  Review of systems: A complete 10  point review of systems was performed and negative except for as noted above in HPI. Past Medical History:  Diagnosis Date  . Allergic rhinitis   . Arthritis    arthritis-kyphosis  . Asthmatic bronchitis   . CKD (chronic kidney disease) stage 3, GFR 30-59 ml/min   . DM type 2 (diabetes mellitus, type 2) ([place])   . DVT, lower extremity, proximal, acute ([place])   . Dyslipidemia   . GERD (gastroesophageal reflux disease)   . Hearing impaired    bilateral hearing aids  . History of kidney stones   . History of skin cancer   . Hypertension   . Hypothyroidism   . Macular degeneration    injections done bilaterally recent - 08-22-13  . PAF (paroxysmal atrial fibrillation) ([place])   . Prostate cancer [facility])    Prostate cancer-radiation only,skin cancer-basal cell and last squamous cell right eye  . Stroke ([place])   . Urgency-frequency syndrome   . Urticaria     Past Surgical History:  Procedure Laterality Date  . BACK SURGERY     lumbar fracture  . cataract surgery Bilateral   . COLONOSCOPY W/ POLYPECTOMY     x2   . CORONARY STENT INTERVENTION N/A 08/06/2017   Procedure: CORONARY STENT INTERVENTION;  Surgeon: Jettie Booze, MD;  Location: [facility];  Service: Cardiovascular;  Laterality: N/A;  . CYSTOSCOPY W/ URETERAL STENT PLACEMENT Right 08/13/2017   Procedure: CYSTOSCOPY WITH RIGHT  RETROGRADE PYELOGRAM/URETERAL STENT PLACEMENT;  Surgeon: Raynelle Bring, MD;  Location:  [facility];  Service: Urology;  Laterality: Right;  . CYSTOSCOPY WITH INJECTION N/A 09/11/2013   Procedure: CYSTOSCOPY WITH BOTOX INJECTION INTO THE BLADDER;  Surgeon: Ailene Rud, MD;  Location: [facility];  Service: Urology;  Laterality: N/A;  . CYSTOSCOPY WITH RETROGRADE PYELOGRAM, URETEROSCOPY AND STENT PLACEMENT    . CYSTOSCOPY WITH STENT PLACEMENT Right 01/26/2016   Procedure: CYSTOSCOPY, RIGHT RETROGRADE WITH RIGHT URETERAL STENT PLACEMENT;  Surgeon: Cleon Gustin, MD;  Location: [facility];  Service:  Urology;  Laterality: Right;  . CYSTOSCOPY/RETROGRADE/URETEROSCOPY Right 04/22/2016   Procedure: CYSTOSCOPY/RETROGRADE/ REMOVAL JJ STENT right insertion stent right insertion of foley;  Surgeon: Franchot Gallo, MD;  Location: [facility];  Service: Urology;  Laterality: Right;  . EXTRACORPOREAL SHOCK WAVE LITHOTRIPSY Right 02/27/2016   Procedure: RIGHT EXTRACORPOREAL SHOCK WAVE LITHOTRIPSY (ESWL) GAITED;  Surgeon: Cleon Gustin, MD;  Location: [facility];  Service: Urology;  Laterality: Right;  . EXTRACORPOREAL SHOCK WAVE LITHOTRIPSY Right 02/10/2016   Procedure: EXTRACORPOREAL SHOCK WAVE LITHOTRIPSY (ESWL);  Surgeon: Kathie Rhodes, MD;  Location: [facility];  Service: Urology;  Laterality: Right;  [facility]-[phone] [id number] A [employer]- [id number]   . EYE SURGERY     macular degeneration  . GALLBLADDER SURGERY  2006   no cholecystectomy  . HERNIA REPAIR    . LEFT HEART CATH AND CORONARY ANGIOGRAPHY N/A 08/06/2017   Procedure: LEFT HEART CATH AND CORONARY ANGIOGRAPHY;  Surgeon: Jettie Booze, MD;  Location: [facility];  Service: Cardiovascular;  Laterality: N/A;  . LUNG REMOVAL, PARTIAL  age 35   for bronchiectasis  . PARTIAL THYMECTOMY  1985   for nodules  . POPLITEAL SYNOVIAL CYST EXCISION  2005  . TOTAL KNEE ARTHROPLASTY  2008   right  . TOTAL KNEE ARTHROPLASTY  2010   left     reports that he has never smoked. He has never used smokeless tobacco. He reports that he does not drink alcohol or use drugs.  Allergies  Allergen Reactions  . Colchicine Anaphylaxis  . Hibiclens [Chlorhexidine] Hives  . Atorvastatin Other (See Comments)    Joint pain   . Ceftin Diarrhea  . Celebrex [Celecoxib] Swelling  . Codeine Hives  . Erythromycin Diarrhea  . Ezetimibe Other (See Comments)    Joint pain   . Iodinated Diagnostic Agents Nausea And Vomiting and Rash  . Oxycodone-Acetaminophen Hives  . Rosuvastatin Other (See Comments)    Joint pain   . Simvastatin  Other (See Comments)    Joint pain   . Cefpodoxime Rash  . Cefuroxime Axetil Rash  . Nitroglycerin Other (See Comments)    Lowers patient's B/P  . Tetanus Toxoid Rash  . Vantin Other (See Comments)    Unknown/not noted on MAR??    Family History  Problem Relation Age of Onset  . Other Sister        ophth disease  . Heart attack Father        x7  . Heart disease Father        MI x7  . Hypertension Mother   . Stroke Mother 46  . Amblyopia Neg Hx   . Blindness Neg Hx   . Glaucoma Neg Hx   . Macular degeneration Neg Hx   . Retinal detachment Neg Hx   . Strabismus Neg Hx   . Retinitis pigmentosa Neg Hx   . Cataracts Neg Hx     Prior to Admission medications   Medication Sig Start Date End Date Taking? Authorizing Provider  albuterol (PROAIR HFA) 108 (90 Base) MCG/ACT inhaler Inhale 2 puffs into the lungs every 8 (eight) hours as needed for shortness of breath.     [provider]  alfuzosin (UROXATRAL) 10 MG 24 hr tablet Take 1 tablet (10 mg total) by mouth at bedtime. 02/10/16   Kathie Rhodes, MD  apixaban (ELIQUIS) 2.5 MG TABS tablet Take 1 tablet (2.5 mg total) by mouth 2 (two) times daily for 30 days. 03/11/18   Elodia Florence., MD  brimonidine-timolol (COMBIGAN) 0.2-0.5 % ophthalmic solution Place 1 drop into both eyes 2 (two) times daily.    [provider]  brinzolamide (AZOPT) 1 % ophthalmic suspension Place 1 drop into the right eye 3 (three) times daily.  09/03/16   [provider]  carbidopa-levodopa (SINEMET IR) 25-100 MG tablet Take 1 tablet by mouth See admin instructions. Take 1 tablet by mouth three times a day- 8 AM, 2 PM, and 8 PM    [provider]  Cholecalciferol (VITAMIN D-3) 5000 units TABS Take 5,000 Units by mouth daily.    [provider]  clopidogrel (PLAVIX) 75 MG tablet Take 75 mg by mouth daily.    [provider]  colesevelam (WELCHOL) 625 MG tablet Take 1,875 mg by mouth daily.     [provider]  diltiazem (CARDIZEM CD) 120 MG 24 hr capsule Take 1 capsule (120 mg total) by mouth daily for 30 days. 03/12/18   Elodia Florence., MD  docusate sodium (COLACE) 100 MG capsule Take 100 mg by mouth 2 (two) times daily.     [provider]  gabapentin (NEURONTIN) 100 MG capsule Take 100 mg by mouth at bedtime.    [provider]  guaiFENesin (MUCINEX) 600 MG 12 hr tablet Take 600 mg by mouth 2 (two) times daily.     [provider]  hydroxypropyl methylcellulose / hypromellose (ISOPTO TEARS / GONIOVISC) 2.5 % ophthalmic solution Place 1 drop into both eyes 4 (four) times daily.    [provider]  latanoprost (XALATAN) 0.005 % ophthalmic solution Place 1 drop into the right eye at bedtime.  07/28/16   [provider]  levocetirizine (XYZAL) 5 MG tablet Take 1 tablet by mouth at bedtime. 04/15/18   [provider]  levothyroxine (SYNTHROID, LEVOTHROID) 175 MCG tablet Take 175 mcg by mouth daily before breakfast.    [provider]  Melatonin 3 MG TABS Take 3 mg by mouth at bedtime.    [provider]  Meth-Hyo-M Bl-Na Phos-Ph Sal (URIBEL) 118 MG CAPS Take 118 mg by mouth 3 (three) times daily.     [provider]  metoprolol succinate (TOPROL-XL) 25 MG 24 hr tablet Take 0.5 tablets (12.5 mg total) by mouth daily for 30 days. 03/12/18   Elodia Florence., MD  mirabegron ER (MYRBETRIQ) 50 MG TB24 tablet Take 1 tablet (50 mg total) by mouth daily. 01/28/16   McKenzie, Candee Furbish, MD  Multiple Vitamins-Minerals (PRESERVISION/LUTEIN) CAPS Take 1 capsule by mouth 2 (two) times daily.    [provider]  omeprazole (PRILOSEC) 40 MG capsule Take 40 mg by mouth daily.    [provider]  polyethylene glycol (MIRALAX / GLYCOLAX) packet Take 17 g by mouth daily. [place]    [provider]  potassium chloride (K-DUR,KLOR-CON) 10 MEQ tablet Take 10 mEq by mouth daily.    [provider]  sitaGLIPtin (JANUVIA) 50 MG tablet Take 1 tablet (50  mg total) by mouth daily. 10/25/15   Orson Eva, MD  traMADol (ULTRAM) 50 MG tablet Take 25 mg by mouth 2 (two) times daily as needed (for pain).     [provider]  zolpidem (AMBIEN) 5 MG tablet Take 0.5 tablets (2.5 mg total) by mouth at bedtime as needed for sleep. 08/08/17   Bonnielee Haff, MD    Physical Exam:  Constitutional: Elderly male NAD, calm, comfortable Vitals:   11/13/18 1245 11/13/18 1250 11/13/18 1300 11/13/18 1317  BP: 90/79 97/66 93/66  99/70  Pulse:  (!) 148 (!) 144 (!) 139  Resp: 19  17 19   Temp:  97.8 F (36.6 C)  97.7 F (36.5 C)  TempSrc:  Oral  Oral  SpO2:  99% 99%    Eyes: PERRL, lids and conjunctivae normal ENMT: Mucous membranes are moist. Posterior pharynx clear of any exudate or lesions.hearing aids in place, but still hard of hearing. Neck: normal, supple, no masses, no thyromegaly Respiratory: clear to auscultation bilaterally, no wheezing, no crackles. Normal respiratory effort. No accessory muscle use.  Cardiovascular: Regular rate and rhythm, no murmurs / rubs / gallops.  Trace to 1+ pitting bilateral extremity edema. 2+ pedal pulses. No carotid bruits.  Abdomen: no tenderness, no masses palpated. No hepatosplenomegaly. Bowel sounds positive.  Musculoskeletal: no clubbing / cyanosis. No joint deformity upper and lower extremities. Good ROM, no contractures. Normal muscle tone.  Skin: Pallor present. Neurologic: CN 2-12 grossly intact.  Tremor present. Psychiatric: Flat affect.  Alert oriented x3.     Labs on Admission: I have personally reviewed following labs and imaging studies  CBC: Recent Labs  Lab 11/13/18 0932  WBC 6.7  HGB 6.8*  HCT 23.4*  MCV 75.0*  PLT [id number]   Basic Metabolic Panel: Recent Labs  Lab 11/13/18 0932  NA 138  K 4.3  CL 107  CO2 22  GLUCOSE 124*  BUN 24*  CREATININE 1.32*  CALCIUM 8.4*  MG 2.1   GFR: CrCl cannot be calculated  (Unknown ideal weight.). Liver Function Tests: Recent Labs  Lab 11/13/18 0932  AST 9*  ALT <5  ALKPHOS 61  BILITOT 0.5  PROT 5.5*  ALBUMIN 3.1*   No results for input(s): LIPASE, AMYLASE in the last 168 hours. No results for input(s): AMMONIA in the last 168 hours. Coagulation Profile: No results for input(s): INR, PROTIME in the last 168 hours. Cardiac Enzymes: No results for input(s): CKTOTAL, CKMB, CKMBINDEX, TROPONINI in the last 168 hours. BNP (last 3 results) No results for input(s): PROBNP in the last 8760 hours. HbA1C: No results for input(s): HGBA1C in the last 72 hours. CBG: No results for input(s): GLUCAP in the last 168 hours. Lipid Profile: No results for input(s): CHOL, HDL, LDLCALC, TRIG, CHOLHDL, LDLDIRECT in the last 72 hours. Thyroid Function Tests: No results for input(s): TSH, T4TOTAL, FREET4, T3FREE, THYROIDAB in the last 72 hours. Anemia Panel: No results for input(s): VITAMINB12, FOLATE, FERRITIN, TIBC, IRON, RETICCTPCT in the last 72 hours. Urine analysis:    Component Value Date/Time   COLORURINE GREEN (A) 08/13/2017 0937   APPEARANCEUR HAZY (A) 08/13/2017 0937   LABSPEC 1.020 08/13/2017 0937   PHURINE 7.0 08/13/2017 0937   GLUCOSEU NEGATIVE 08/13/2017 0937   HGBUR TRACE (A) 08/13/2017 [place] NEGATIVE 08/13/2017 0937   KETONESUR NEGATIVE 08/13/2017 0937   PROTEINUR 100 (A) 08/13/2017 0937   UROBILINOGEN 0.2 05/26/2014 2040   NITRITE NEGATIVE 08/13/2017 0937   LEUKOCYTESUR TRACE (A) 08/13/2017 [id number]  Sepsis Labs: No results found for this or any previous visit (from the past 240 hour(s)).   Radiological Exams on Admission: Dg Chest Port 1 View  Result Date: 11/13/2018 CLINICAL DATA:  Atrial fibrillation, weakness. EXAM: PORTABLE CHEST 1 VIEW COMPARISON:  Chest x-rays dated 03/10/2018 and 08/13/2017. FINDINGS: Heart size and mediastinal contours are stable. Lungs are clear. No pleural effusion or pneumothorax is seen. Osseous  structures about the chest are unremarkable. IMPRESSION: No active disease. No evidence of pneumonia or pulmonary edema. Electronically Signed   By: Franki Cabot M.D.   On: 11/13/2018 10:33    EKG: Independently reviewed.  Atrial fibrillation with RVR at a rate of 151 bpm  Assessment/Plan Symptomatic anemia: Acute.  Patient presents with complaints of dizziness found to be in A. fib with RVR and hemoglobin 6.8 g/dL.  Previous hemoglobin noted to be around 9.2 in February of this year.  Stool guaiacs were noted to be negative.  Do not suspect patient to be acutely bleeding. -Admit to a stepdown bed -Continue with transfusion of 1 unit of PRBC -Recheck H&H 2 hours post transfusion -Continue to monitor and replace blood as needed for hemoglobin less than 8  Paroxysmal atrial fibrillation on chronic anticoagulation: Patient found to be in A. fib with RVR with heart rates into the 160s.  He had already taken his home medications of Cardizem CD 120mg  and metoprolol and metoprolol XL 12.5 mg daily.  Potassium 4.3 and magnesium 2.1 on admission.  Patient converted back into sinus rhythm after receiving additional 5 mg of metoprolol in route with EMS and being started on a Cardizem drip. CHA2DS2-VASc score = 5. -Continue current home medication regimen Cardizem and metoprolol -Held Eliquis -Goal magnesium 2 and potassium 4 -Message sent for cardiology to evaluate in a.m. regarding any medication changes needed and/or when to restart anticoagulation  Elevated troponin: Acute.  Reported complaints of chest tightness, but denied any chest pain. Suspect secondary to acute stress with rapid heart rates.  -Continue to monitor   Diabetes mellitus type 2: Blood sugars relatively well controlled.  Last hemoglobin A1c available noted to be 6.3 on 08/04/2017. -Hypoglycemic protocols -Hold sitagliptin -CBGs before every meal with sensitive SSI  Postsurgical hypothyroidism: Last TSH noted to be 0.184 on  03/10/2018. -Check TSH -Continue levothyroxine  Parkinson's disease -Continue Sinemet IR  Chronic kidney disease stage III: Creatinine 1.32 on admission which appears slightly improved or near previous baseline. -Continue to monitor  BPH -Continue current home regimen   DVT prophylaxis: SCDs Code Status: DNR Family Communication: No family present at bedside Disposition Plan: Possible discharge home in 1 to 2 days Consults called: Cardiology  Admission status: observation  Norval Morton MD [facility] Pager [phone]   If 7PM-7AM, please contact night-coverage [email or web address] Password [email or web address]  11/13/2018, 1:29 PM

## 2018-11-13 NOTE — ED Provider Notes (Signed)
[facility] EMERGENCY DEPARTMENT Provider Note   CSN: [id number] Arrival date & time: 11/13/18  0857     History   Chief Complaint Chief Complaint  Patient presents with   Chest Pain    HPI [name] is a 85 y.o. male.     HPI  Patient presents from a nursing facility after developing chest pain, possible hypotension. Patient has very poor hearing, but cannot provide his own history somewhat. Additional details obtained by EMS providers, on arrival. Seemingly, the patient has been in his usual state of health, which includes managing multiple medical problems, taking medication regularly, with no recent changes. Today, it is unclear if he received his typical home medications at the appropriate, typical time, including diltiazem and metoprolol. Just prior to EMS notification the patient was on a bicycle for exercise, when he developed lightheadedness, chest pain.  It is unclear what the characteristics of the pain were, neither the patient nor nursing staff report this.  On evaluation the patient was found to have tachycardia, with a heart rate greater than 140, as well as hypotension, with a systolic less than [id number].  Patient received labetalol by nursing home staff, oral, 5 mg, reportedly. Currently the patient states that he feels no pain, no lightheadedness, acknowledges mild weakness in general, but not focally. He denies recent changes including fever, cough, vomiting, diarrhea.  Past Medical History:  Diagnosis Date   Allergic rhinitis    Arthritis    arthritis-kyphosis   Asthmatic bronchitis    CKD (chronic kidney disease) stage 3, GFR 30-59 ml/min    DM type 2 (diabetes mellitus, type 2) (HCC)    DVT, lower extremity, proximal, acute (HCC)    Dyslipidemia    GERD (gastroesophageal reflux disease)    Hearing impaired    bilateral hearing aids   History of kidney stones    History of skin cancer    Hypertension     Hypothyroidism    Macular degeneration    injections done bilaterally recent - 08-22-13   PAF (paroxysmal atrial fibrillation) (HCC)    Prostate cancer ([place])    Prostate cancer-radiation only,skin cancer-basal cell and last squamous cell right eye   Stroke [facility])    Urgency-frequency syndrome    Urticaria     Patient Active Problem List   Diagnosis Date Noted   PAF (paroxysmal atrial fibrillation) ([place])    Coronary artery disease involving native coronary artery of native heart without angina pectoris    Hyperlipidemia LDL goal <70    Urinary tract infection with hematuria    Non-ST elevation (NSTEMI) myocardial infarction ([place])    GERD (gastroesophageal reflux disease) 08/04/2017   CKD (chronic kidney disease), stage III 08/03/2017   Type II diabetes mellitus with renal manifestations ([place]) 08/03/2017   Pseudophakia of both eyes 05/31/2017   Retained ureteral stent 04/22/2016   Ureteral calculus 01/25/2016   Benign essential HTN 10/24/2015   CVA (cerebral vascular accident) ([place]) 10/14/2014   TIA (transient ischemic attack)    CME (cystoid macular edema), left 08/10/2013   Squamous cell carcinoma of eyelid 05/15/2013   Blepharitis 03/16/2013   Posterior vitreous detachment of right eye 10/23/2011   Exudative age-related macular degeneration ([place]) 07/28/2011   Nonproliferative diabetic retinopathy ([place]) 04/07/2011   Insomnia, unspecified 05/19/2010   ATAXIA 03/17/2010   DIZZINESS 03/06/2010   Chronic constipation 02/21/2008   OSTEOARTHRITIS, KNEE, SEVERE 01/12/2008   Hypothyroidism (post surgical) 10/15/2006   Diabetes mellitus, insulin dependent (IDDM), controlled 04/22/2006  URTICARIA 04/22/2006   POPLITEAL CYST 04/22/2006   THYROIDECTOMY, SUBTOTAL, HX OF 04/22/2006   TOTAL KNEE REPLACEMENT, HX OF 04/22/2006    Past Surgical History:  Procedure Laterality Date   BACK SURGERY     lumbar fracture   cataract surgery Bilateral     COLONOSCOPY W/ POLYPECTOMY     x2    CORONARY STENT INTERVENTION N/A 08/06/2017   Procedure: CORONARY STENT INTERVENTION;  Surgeon: Jettie Booze, MD;  Location: [facility];  Service: Cardiovascular;  Laterality: N/A;   CYSTOSCOPY W/ URETERAL STENT PLACEMENT Right 08/13/2017   Procedure: CYSTOSCOPY WITH RIGHT  RETROGRADE PYELOGRAM/URETERAL STENT PLACEMENT;  Surgeon: Raynelle Bring, MD;  Location: [facility];  Service: Urology;  Laterality: Right;   CYSTOSCOPY WITH INJECTION N/A 09/11/2013   Procedure: CYSTOSCOPY WITH BOTOX INJECTION INTO THE BLADDER;  Surgeon: Ailene Rud, MD;  Location: [facility];  Service: Urology;  Laterality: N/A;   CYSTOSCOPY WITH RETROGRADE PYELOGRAM, URETEROSCOPY AND STENT PLACEMENT     CYSTOSCOPY WITH STENT PLACEMENT Right 01/26/2016   Procedure: CYSTOSCOPY, RIGHT RETROGRADE WITH RIGHT URETERAL STENT PLACEMENT;  Surgeon: Cleon Gustin, MD;  Location: [facility];  Service: Urology;  Laterality: Right;   CYSTOSCOPY/RETROGRADE/URETEROSCOPY Right 04/22/2016   Procedure: CYSTOSCOPY/RETROGRADE/ REMOVAL JJ STENT right insertion stent right insertion of foley;  Surgeon: Franchot Gallo, MD;  Location: [facility];  Service: Urology;  Laterality: Right;   EXTRACORPOREAL SHOCK WAVE LITHOTRIPSY Right 02/27/2016   Procedure: RIGHT EXTRACORPOREAL SHOCK WAVE LITHOTRIPSY (ESWL) GAITED;  Surgeon: Cleon Gustin, MD;  Location: [facility];  Service: Urology;  Laterality: Right;   EXTRACORPOREAL SHOCK WAVE LITHOTRIPSY Right 02/10/2016   Procedure: EXTRACORPOREAL SHOCK WAVE LITHOTRIPSY (ESWL);  Surgeon: Kathie Rhodes, MD;  Location: [facility];  Service: Urology;  Laterality: Right;  [employer] [employer] [phone] [place] [employer]- [id number]    EYE SURGERY     macular degeneration   GALLBLADDER SURGERY  2006   no cholecystectomy   HERNIA REPAIR     LEFT HEART CATH AND CORONARY ANGIOGRAPHY N/A 08/06/2017   Procedure: LEFT HEART CATH AND CORONARY ANGIOGRAPHY;   Surgeon: Jettie Booze, MD;  Location: [facility];  Service: Cardiovascular;  Laterality: N/A;   LUNG REMOVAL, PARTIAL  age 41   for bronchiectasis   PARTIAL THYMECTOMY  1985   for nodules   POPLITEAL SYNOVIAL CYST EXCISION  2005   TOTAL KNEE ARTHROPLASTY  2008   right   TOTAL KNEE ARTHROPLASTY  2010   left        Home Medications    Prior to Admission medications   Medication Sig Start Date End Date Taking? Authorizing Provider  albuterol (PROAIR HFA) 108 (90 Base) MCG/ACT inhaler Inhale 2 puffs into the lungs every 8 (eight) hours as needed for shortness of breath.     [provider]  alfuzosin (UROXATRAL) 10 MG 24 hr tablet Take 1 tablet (10 mg total) by mouth at bedtime. 02/10/16   Kathie Rhodes, MD  apixaban (ELIQUIS) 2.5 MG TABS tablet Take 1 tablet (2.5 mg total) by mouth 2 (two) times daily for 30 days. 03/11/18   Elodia Florence., MD  brimonidine-timolol (COMBIGAN) 0.2-0.5 % ophthalmic solution Place 1 drop into both eyes 2 (two) times daily.    [provider]  brinzolamide (AZOPT) 1 % ophthalmic suspension Place 1 drop into the right eye 3 (three) times daily.  09/03/16   [provider]  carbidopa-levodopa (SINEMET IR) 25-100 MG tablet Take 1 tablet by mouth  See admin instructions. Take 1 tablet by mouth three times a day- 8 AM, 2 PM, and 8 PM    [provider]  Cholecalciferol (VITAMIN D-3) 5000 units TABS Take 5,000 Units by mouth daily.    [provider]  clopidogrel (PLAVIX) 75 MG tablet Take 75 mg by mouth daily.    [provider]  colesevelam (WELCHOL) 625 MG tablet Take 1,875 mg by mouth daily.     [provider]  diltiazem (CARDIZEM CD) 120 MG 24 hr capsule Take 1 capsule (120 mg total) by mouth daily for 30 days. 03/12/18   Elodia Florence., MD  docusate sodium (COLACE) 100 MG capsule Take 100 mg by mouth 2 (two) times daily.     [provider]  gabapentin  (NEURONTIN) 100 MG capsule Take 100 mg by mouth at bedtime.    [provider]  guaiFENesin (MUCINEX) 600 MG 12 hr tablet Take 600 mg by mouth 2 (two) times daily.     [provider]  hydroxypropyl methylcellulose / hypromellose (ISOPTO TEARS / GONIOVISC) 2.5 % ophthalmic solution Place 1 drop into both eyes 4 (four) times daily.    [provider]  latanoprost (XALATAN) 0.005 % ophthalmic solution Place 1 drop into the right eye at bedtime.  07/28/16   [provider]  levocetirizine (XYZAL) 5 MG tablet Take 1 tablet by mouth at bedtime. 04/15/18   [provider]  levothyroxine (SYNTHROID, LEVOTHROID) 175 MCG tablet Take 175 mcg by mouth daily before breakfast.    [provider]  Melatonin 3 MG TABS Take 3 mg by mouth at bedtime.    [provider]  Meth-Hyo-M Bl-Na Phos-Ph Sal (URIBEL) 118 MG CAPS Take 118 mg by mouth 3 (three) times daily.     [provider]  metoprolol succinate (TOPROL-XL) 25 MG 24 hr tablet Take 0.5 tablets (12.5 mg total) by mouth daily for 30 days. 03/12/18   Elodia Florence., MD  mirabegron ER (MYRBETRIQ) 50 MG TB24 tablet Take 1 tablet (50 mg total) by mouth daily. 01/28/16   McKenzie, Candee Furbish, MD  Multiple Vitamins-Minerals (PRESERVISION/LUTEIN) CAPS Take 1 capsule by mouth 2 (two) times daily.    [provider]  omeprazole (PRILOSEC) 40 MG capsule Take 40 mg by mouth daily.    [provider]  polyethylene glycol (MIRALAX / GLYCOLAX) packet Take 17 g by mouth daily. [place]    [provider]  potassium chloride (K-DUR,KLOR-CON) 10 MEQ tablet Take 10 mEq by mouth daily.    [provider]  sitaGLIPtin (JANUVIA) 50 MG tablet Take 1 tablet (50 mg total) by mouth daily. 10/25/15   Orson Eva, MD  traMADol (ULTRAM) 50 MG tablet Take 25 mg by mouth 2 (two) times daily as needed (for pain).     [provider]  zolpidem (AMBIEN) 5 MG tablet Take 0.5  tablets (2.5 mg total) by mouth at bedtime as needed for sleep. 08/08/17   Bonnielee Haff, MD    Family History Family History  Problem Relation Age of Onset   Other Sister        ophth disease   Heart attack Father        x7   Heart disease Father        MI x7   Hypertension Mother    Stroke Mother 59   Amblyopia Neg Hx    Blindness Neg Hx    Glaucoma Neg Hx  Macular degeneration Neg Hx    Retinal detachment Neg Hx    Strabismus Neg Hx    Retinitis pigmentosa Neg Hx    Cataracts Neg Hx     Social History Social History   Tobacco Use   Smoking status: Never Smoker   Smokeless tobacco: Never Used  Substance Use Topics   Alcohol use: No   Drug use: No     Allergies   Colchicine, Hibiclens [chlorhexidine], Atorvastatin, Ceftin, Celebrex [celecoxib], Codeine, Erythromycin, Ezetimibe, Iodinated diagnostic agents, Oxycodone-acetaminophen, Rosuvastatin, Simvastatin, Cefpodoxime, Cefuroxime axetil, Nitroglycerin, Tetanus toxoid, and Vantin   Review of Systems Review of Systems  Constitutional:       Per HPI, otherwise negative  HENT:       Per HPI, otherwise negative  Respiratory:       Per HPI, otherwise negative  Cardiovascular:       Per HPI, otherwise negative  Gastrointestinal: Negative for vomiting.  Endocrine:       Negative aside from HPI  Genitourinary:       Neg aside from HPI   Musculoskeletal:       Per HPI, otherwise negative  Skin: Negative.   Neurological: Negative for syncope.     Physical Exam Updated Vital Signs BP 93/66    Pulse (!) 144    Temp 97.8 F (36.6 C) (Oral)    Resp 17    SpO2 99%   Physical Exam Vitals signs and nursing note reviewed.  Constitutional:      Appearance: He is ill-appearing.     Comments: Sickly appearing elderly male resting in gurney, no gross distress.  HENT:     Head: Normocephalic and atraumatic.  Eyes:     Conjunctiva/sclera: Conjunctivae normal.  Cardiovascular:     Rate and  Rhythm: Tachycardia present. Rhythm irregular.  Pulmonary:     Effort: Pulmonary effort is normal. No respiratory distress.     Breath sounds: No stridor.  Abdominal:     General: There is no distension.  Genitourinary:    Rectum: Guaiac result negative.  Skin:    General: Skin is warm and dry.     Coloration: Skin is pale.  Neurological:     Mental Status: He is alert and oriented to person, place, and time.      ED Treatments / Results  Labs (all labs ordered are listed, but only abnormal results are displayed) Labs Reviewed  CBC - Abnormal; Notable for the following components:      Result Value   RBC 3.12 (*)    Hemoglobin 6.8 (*)    HCT 23.4 (*)    MCV 75.0 (*)    MCH 21.8 (*)    MCHC 29.1 (*)    RDW 15.9 (*)    All other components within normal limits  COMPREHENSIVE METABOLIC PANEL - Abnormal; Notable for the following components:   Glucose, Bld 124 (*)    BUN 24 (*)    Creatinine, Ser 1.32 (*)    Calcium 8.4 (*)    Total Protein 5.5 (*)    Albumin 3.1 (*)    AST 9 (*)    GFR calc non Af Amer 49 (*)    GFR calc Af Amer 57 (*)    All other components within normal limits  TROPONIN I (HIGH SENSITIVITY) - Abnormal; Notable for the following components:   Troponin I (High Sensitivity) 22 (*)    All other components within normal limits  SARS CORONAVIRUS 2 (TAT 6-24 HRS)  MAGNESIUM  OCCULT BLOOD X 1 CARD TO LAB, STOOL  POC OCCULT BLOOD, ED  TYPE AND SCREEN  PREPARE RBC (CROSSMATCH)  ABO/RH  TROPONIN I (HIGH SENSITIVITY)    EKG EKG Interpretation  Date/Time:  Sunday November 13 2018 09:07:05 EST Ventricular Rate:  151 PR Interval:    QRS Duration: 79 QT Interval:  302 QTC Calculation: 479 R Axis:   62 Text Interpretation: Atrial fibrillation with rapid V-rate Ventricular premature complex Anterior infarct, old Repolarization abnormality, prob rate related Abnormal ECG Confirmed by Carmin Muskrat [id number]) on 11/13/2018 9:21:30 AM   Radiology Dg  Chest Port 1 View  Result Date: 11/13/2018 CLINICAL DATA:  Atrial fibrillation, weakness. EXAM: PORTABLE CHEST 1 VIEW COMPARISON:  Chest x-rays dated 03/10/2018 and 08/13/2017. FINDINGS: Heart size and mediastinal contours are stable. Lungs are clear. No pleural effusion or pneumothorax is seen. Osseous structures about the chest are unremarkable. IMPRESSION: No active disease. No evidence of pneumonia or pulmonary edema. Electronically Signed   By: Franki Cabot M.D.   On: 11/13/2018 10:33    Procedures Procedures (including critical care time)  CRITICAL CARE Performed by: Carmin Muskrat Total critical care time: 35 minutes Critical care time was exclusive of separately billable procedures and treating other patients. Critical care was necessary to treat or prevent imminent or life-threatening deterioration. Critical care was time spent personally by me on the following activities: development of treatment plan with patient and/or surrogate as well as nursing, discussions with consultants, evaluation of patient's response to treatment, examination of patient, obtaining history from patient or surrogate, ordering and performing treatments and interventions, ordering and review of laboratory studies, ordering and review of radiographic studies, pulse oximetry and re-evaluation of patient's condition.   Medications Ordered in ED Medications  0.9 %  sodium chloride infusion (has no administration in time range)  diltiazem (CARDIZEM) 100 mg in dextrose 5% 124mL (1 mg/mL) infusion (5 mg/hr Intravenous New Bag/Given 11/13/18 1229)  0.9 %  sodium chloride infusion ( Intravenous New Bag/Given 11/13/18 1047)     Initial Impression / Assessment and Plan / ED Course  I have reviewed the triage vital signs and the nursing notes.  Pertinent labs & imaging results that were available during my care of the patient were reviewed by me and considered in my medical decision making (see chart for  details).    EMS rhythm strips reviewed, consistent with atrial fibrillation with rapid ventricular response with rate in the 140/150 range.     Not long after arrival, with concern for A. fib, patient began Cardizem drip.   Initial labs notable for hemoglobin 6.8, down from baseline 9.  Hemoccult negative, but patient has had type and screen was preparing for transfusion.   On repeat exam the patient is in similar condition, blood pressure slightly better than on arrival, after fluids, and in spite of initial mild diltiazem. I discussed the patient's case with our cardiology colleagues, given the patient's multiple medical issues, ongoing anticoagulant use, new anemia. Recommendation is for hospitalist admission, cardiology consult as needed.  1:16 PM Blood transfusing, pressure improved, heart rate slightly down.  This elderly male presents from his nursing facility with concern for weakness, lightheadedness.  On patient is found to have new anemia.  Patient also found to have recurrence of his known A. fib. With this consideration, after initial rate control efforts had to be modified due to his hypotension, the patient was found to be anemic, requiring transfusion of blood. Given these multiple abnormalities, patient  required admission.  The patient has a slight elevation in his troponin, additionally requiring monitoring, though he continues to deny any ongoing chest pain, and his EKG, though with noted dysrhythmias not overtly ischemic.  Final Clinical Impressions(s) / ED Diagnoses   Final diagnoses:  Atrial fibrillation with RVR ([place])  Symptomatic anemia    ED Discharge Orders         Ordered    Amb referral to AFIB Clinic     11/13/18 0909           Carmin Muskrat, MD 11/13/18 1320

## 2018-11-14 DIAGNOSIS — D500 Iron deficiency anemia secondary to blood loss (chronic): Secondary | ICD-10-CM | POA: Diagnosis not present

## 2018-11-14 DIAGNOSIS — G20 Parkinson's disease: Secondary | ICD-10-CM | POA: Diagnosis not present

## 2018-11-14 DIAGNOSIS — E1122 Type 2 diabetes mellitus with diabetic chronic kidney disease: Secondary | ICD-10-CM

## 2018-11-14 DIAGNOSIS — E039 Hypothyroidism, unspecified: Secondary | ICD-10-CM

## 2018-11-14 DIAGNOSIS — D649 Anemia, unspecified: Secondary | ICD-10-CM | POA: Diagnosis not present

## 2018-11-14 DIAGNOSIS — I480 Paroxysmal atrial fibrillation: Secondary | ICD-10-CM

## 2018-11-14 DIAGNOSIS — N183 Chronic kidney disease, stage 3 unspecified: Secondary | ICD-10-CM

## 2018-11-14 LAB — CBC
HCT: 29 % — ABNORMAL LOW (ref 39.0–52.0)
Hemoglobin: 8.7 g/dL — ABNORMAL LOW (ref 13.0–17.0)
MCH: 22.3 pg — ABNORMAL LOW (ref 26.0–34.0)
MCHC: 30 g/dL (ref 30.0–36.0)
MCV: 74.4 fL — ABNORMAL LOW (ref 80.0–100.0)
Platelets: 282 10*3/uL (ref 150–400)
RBC: 3.9 MIL/uL — ABNORMAL LOW (ref 4.22–5.81)
RDW: 16.4 % — ABNORMAL HIGH (ref 11.5–15.5)
WBC: 8.3 10*3/uL (ref 4.0–10.5)
nRBC: 0 % (ref 0.0–0.2)

## 2018-11-14 LAB — URINALYSIS, ROUTINE W REFLEX MICROSCOPIC
Bilirubin Urine: NEGATIVE
Glucose, UA: NEGATIVE mg/dL
Ketones, ur: NEGATIVE mg/dL
Nitrite: NEGATIVE
Protein, ur: 100 mg/dL — AB
RBC / HPF: >50 RBC/hpf — ABNORMAL HIGH (ref 0–5)
Specific Gravity, Urine: 1.012 (ref 1.005–1.030)
WBC, UA: >50 WBC/hpf — ABNORMAL HIGH (ref 0–5)
pH: 6 (ref 5.0–8.0)

## 2018-11-14 LAB — BASIC METABOLIC PANEL
Anion gap: 7 (ref 5–15)
BUN: 25 mg/dL — ABNORMAL HIGH (ref 8–23)
CO2: 22 mmol/L (ref 22–32)
Calcium: 8.5 mg/dL — ABNORMAL LOW (ref 8.9–10.3)
Chloride: 109 mmol/L (ref 98–111)
Creatinine, Ser: 1.3 mg/dL — ABNORMAL HIGH (ref 0.61–1.24)
GFR calc Af Amer: 58 mL/min — ABNORMAL LOW (ref >60)
GFR calc non Af Amer: 50 mL/min — ABNORMAL LOW (ref >60)
Glucose, Bld: 85 mg/dL (ref 70–99)
Potassium: 4.1 mmol/L (ref 3.5–5.1)
Sodium: 138 mmol/L (ref 135–145)

## 2018-11-14 LAB — TYPE AND SCREEN
ABO/RH(D): O POS
Antibody Screen: NEGATIVE
Unit division: 0

## 2018-11-14 LAB — BPAM RBC
Blood Product Expiration Date: 202012022359
ISSUE DATE / TIME: 202011011240
Unit Type and Rh: 5100

## 2018-11-14 LAB — OCCULT BLOOD, POC DEVICE: Fecal Occult Bld: NEGATIVE

## 2018-11-14 LAB — FERRITIN: Ferritin: 7 ng/mL — ABNORMAL LOW (ref 24–336)

## 2018-11-14 LAB — IRON AND TIBC
Iron: 26 ug/dL — ABNORMAL LOW (ref 45–182)
Saturation Ratios: 7 % — ABNORMAL LOW (ref 17.9–39.5)
TIBC: 351 ug/dL (ref 250–450)
UIBC: 325 ug/dL

## 2018-11-14 MED ORDER — ALFUZOSIN HCL ER 10 MG PO TB24
10.0000 mg | ORAL_TABLET | Freq: Every day | ORAL | Status: DC
  Administered 2018-11-14: 10 mg via ORAL
  Filled 2018-11-14: qty 1

## 2018-11-14 MED ORDER — LATANOPROST 0.005 % OP SOLN
1.0000 [drp] | Freq: Every day | OPHTHALMIC | Status: DC
  Filled 2018-11-14: qty 2.5

## 2018-11-14 MED ORDER — BRINZOLAMIDE 1 % OP SUSP
1.0000 [drp] | Freq: Three times a day (TID) | OPHTHALMIC | Status: DC
  Administered 2018-11-14 (×2): 1 [drp] via OPHTHALMIC
  Filled 2018-11-14: qty 10

## 2018-11-14 MED ORDER — FUROSEMIDE 20 MG PO TABS
20.0000 mg | ORAL_TABLET | Freq: Every day | ORAL | Status: DC
  Administered 2018-11-14: 20 mg via ORAL
  Filled 2018-11-14: qty 1

## 2018-11-14 MED ORDER — FERROUS SULFATE 325 (65 FE) MG PO TBEC: 325.0000 mg | DELAYED_RELEASE_TABLET | Freq: Two times a day (BID) | ORAL | 11 refills | Status: AC

## 2018-11-14 MED ORDER — DOCUSATE SODIUM 100 MG PO CAPS
100.0000 mg | ORAL_CAPSULE | Freq: Two times a day (BID) | ORAL | Status: DC
  Administered 2018-11-14: 100 mg via ORAL
  Filled 2018-11-14: qty 1

## 2018-11-14 MED ORDER — TIMOLOL MALEATE 0.5 % OP SOLN
1.0000 [drp] | Freq: Two times a day (BID) | OPHTHALMIC | Status: DC
  Administered 2018-11-14: 1 [drp] via OPHTHALMIC
  Filled 2018-11-14: qty 5

## 2018-11-14 MED ORDER — FLUTICASONE PROPIONATE 50 MCG/ACT NA SUSP
2.0000 | Freq: Every day | NASAL | Status: DC
  Administered 2018-11-14: 2 via NASAL
  Filled 2018-11-14: qty 16

## 2018-11-14 MED ORDER — CARBIDOPA-LEVODOPA 25-100 MG PO TABS
1.0000 | ORAL_TABLET | ORAL | Status: DC
  Administered 2018-11-14 (×2): 1 via ORAL
  Filled 2018-11-14 (×2): qty 1

## 2018-11-14 MED ORDER — GABAPENTIN 100 MG PO CAPS: 100.0000 mg | ORAL_CAPSULE | Freq: Every day | ORAL | Status: DC

## 2018-11-14 MED ORDER — METOPROLOL SUCCINATE ER 25 MG PO TB24
12.5000 mg | ORAL_TABLET | Freq: Every day | ORAL | Status: DC
  Administered 2018-11-14: 12.5 mg via ORAL
  Filled 2018-11-14: qty 1

## 2018-11-14 MED ORDER — DILTIAZEM HCL ER COATED BEADS 120 MG PO CP24
120.0000 mg | ORAL_CAPSULE | Freq: Every day | ORAL | Status: DC
  Administered 2018-11-14: 120 mg via ORAL
  Filled 2018-11-14: qty 1

## 2018-11-14 MED ORDER — LINAGLIPTIN 5 MG PO TABS
5.0000 mg | ORAL_TABLET | Freq: Every day | ORAL | Status: DC
  Administered 2018-11-14: 5 mg via ORAL
  Filled 2018-11-14: qty 1

## 2018-11-14 MED ORDER — SERTRALINE HCL 50 MG PO TABS
25.0000 mg | ORAL_TABLET | Freq: Every day | ORAL | Status: DC
  Administered 2018-11-14: 25 mg via ORAL
  Filled 2018-11-14: qty 1

## 2018-11-14 MED ORDER — BRIMONIDINE TARTRATE 0.2 % OP SOLN
1.0000 [drp] | Freq: Two times a day (BID) | OPHTHALMIC | Status: DC
  Administered 2018-11-14: 1 [drp] via OPHTHALMIC
  Filled 2018-11-14: qty 5

## 2018-11-14 MED ORDER — POTASSIUM CHLORIDE CRYS ER 10 MEQ PO TBCR
10.0000 meq | EXTENDED_RELEASE_TABLET | Freq: Every day | ORAL | Status: DC
  Administered 2018-11-14: 10 meq via ORAL
  Filled 2018-11-14: qty 1

## 2018-11-14 MED ORDER — MELATONIN 3 MG PO TABS
3.0000 mg | ORAL_TABLET | Freq: Every day | ORAL | Status: DC
  Filled 2018-11-14: qty 1

## 2018-11-14 MED ORDER — LORATADINE 10 MG PO TABS: 10.0000 mg | ORAL_TABLET | Freq: Every day | ORAL | Status: DC

## 2018-11-14 MED ORDER — POLYETHYLENE GLYCOL 3350 17 G PO PACK
17.0000 g | PACK | Freq: Every day | ORAL | Status: DC
  Administered 2018-11-14: 17 g via ORAL
  Filled 2018-11-14: qty 1

## 2018-11-14 MED ORDER — LEVOTHYROXINE SODIUM 75 MCG PO TABS
150.0000 ug | ORAL_TABLET | Freq: Every day | ORAL | Status: DC
  Administered 2018-11-14: 150 ug via ORAL
  Filled 2018-11-14 (×2): qty 2

## 2018-11-14 MED ORDER — MIRABEGRON ER 50 MG PO TB24
50.0000 mg | ORAL_TABLET | Freq: Every day | ORAL | Status: DC
  Administered 2018-11-14: 50 mg via ORAL
  Filled 2018-11-14: qty 1

## 2018-11-14 MED ORDER — BRIMONIDINE TARTRATE-TIMOLOL 0.2-0.5 % OP SOLN: 1.0000 [drp] | Freq: Two times a day (BID) | OPHTHALMIC | Status: DC

## 2018-11-14 NOTE — Evaluation (Signed)
Physical Therapy Evaluation Patient Details Name: [name] MRN: [id number] DOB: 03/31/1931 Today's Date: 11/14/2018   History of Present Illness  85yo male admitted with c/o dizziness and chest tightness when riding exercise machine earlier, found to be in A-fib and hypotensive in ED. Converted to NSR in ED. Hgb 6.8 and received 1 unit PRBC. PMH CVA, parkinsons, A-fib, HTN, HOH, hx DVT, DM, CKD, B TKR, partial lung removal, cardiac cath, back surgery  Clinical Impression   Patient received in bed, pleasant and willing to participate in PT but quite HOH. BP in supine 160/90, rose to 174/92 in sitting, found to be 170/92 after ambulation- returned to bed due to elevated BP this session. Able to perform bed mobility and functional transfers with MinA and RW, gait 20ft with RW and min guard but limited due to elevated BP/MAP this morning. Left in bed with all needs met and bed alarm active. Recommending HHPT at his ALF but he adamantly refuses stating "I've been through so much therapy for my knee replacements, I don't like it and I don't need it" despite ongoing education about benefits of targeted skilled PT services.     Follow Up Recommendations Home health PT;Other (comment)(at ALF facility)    Equipment Recommendations  Rolling walker with 5" wheels;3in1 (PT)    Recommendations for Other Services       Precautions / Restrictions Precautions Precautions: Fall;Other (comment) Precaution Comments: watch vitals Restrictions Weight Bearing Restrictions: No      Mobility  Bed Mobility Overal bed mobility: Needs Assistance Bed Mobility: Supine to Sit;Sit to Supine     Supine to sit: Min assist Sit to supine: Min guard   General bed mobility comments: MinA to elevate trunk, extended time  Transfers Overall transfer level: Needs assistance Equipment used: Rolling walker (2 wheeled) Transfers: Sit to/from Stand Sit to Stand: Min assist         General transfer comment:  MinA with RW, cues for hand placement; unable to stand without RW  Ambulation/Gait Ambulation/Gait assistance: Min guard Gait Distance (Feet): 20 Feet Assistive device: Rolling walker (2 wheeled) Gait Pattern/deviations: Step-through pattern;Decreased step length - right;Decreased step length - left;Shuffle;Decreased dorsiflexion - right;Decreased dorsiflexion - left;Trunk flexed;Narrow base of support Gait velocity: decreased   General Gait Details: shuffling gait pattern, limited gait distance due to elevated BP (175/90) and MAP; slow but steady with RW at self-selected pace  Stairs            Wheelchair Mobility    Modified Rankin (Stroke Patients Only)       Balance Overall balance assessment: Needs assistance Sitting-balance support: Bilateral upper extremity supported;Feet supported Sitting balance-Leahy Scale: Good     Standing balance support: Bilateral upper extremity supported;During functional activity Standing balance-Leahy Scale: Fair                               Pertinent Vitals/Pain Pain Assessment: No/denies pain    Home Living Family/patient expects to be discharged to:: Assisted living               Home Equipment: Walker - 2 wheels Additional Comments: patient unclear historian- seems to live in ALF and uses RW when out of home    Prior Function Level of Independence: Needs assistance   Gait / Transfers Assistance Needed: walker with independence  ADL's / Homemaking Assistance Needed: at first said he needs help with bathing/dressing then said he didn't          Hand Dominance   Dominant Hand: Right    Extremity/Trunk Assessment   Upper Extremity Assessment Upper Extremity Assessment: Generalized weakness    Lower Extremity Assessment Lower Extremity Assessment: Generalized weakness    Cervical / Trunk Assessment Cervical / Trunk Assessment: Kyphotic  Communication   Communication: HOH  Cognition  Arousal/Alertness: Awake/alert Behavior During Therapy: WFL for tasks assessed/performed Overall Cognitive Status: Within Functional Limits for tasks assessed                                        General Comments      Exercises     Assessment/Plan    PT Assessment Patient needs continued PT services  PT Problem List Decreased strength;Decreased knowledge of use of DME;Decreased activity tolerance;Decreased safety awareness;Decreased balance;Decreased mobility;Decreased coordination       PT Treatment Interventions DME instruction;Balance training;Gait training;Neuromuscular re-education;Stair training;Functional mobility training;Patient/family education;Therapeutic exercise;Therapeutic activities    PT Goals (Current goals can be found in the Care Plan section)  Acute Rehab PT Goals Patient Stated Goal: go home PT Goal Formulation: With patient Time For Goal Achievement: 11/28/18 Potential to Achieve Goals: Good    Frequency Min 3X/week   Barriers to discharge        Co-evaluation               AM-PAC PT "6 Clicks" Mobility  Outcome Measure Help needed turning from your back to your side while in a flat bed without using bedrails?: A Little Help needed moving from lying on your back to sitting on the side of a flat bed without using bedrails?: A Little Help needed moving to and from a bed to a chair (including a wheelchair)?: A Little Help needed standing up from a chair using your arms (e.g., wheelchair or bedside chair)?: A Little Help needed to walk in hospital room?: A Little Help needed climbing 3-5 steps with a railing? : A Little 6 Click Score: 18    End of Session Equipment Utilized During Treatment: Gait belt Activity Tolerance: Patient tolerated treatment well Patient left: in bed;with call bell/phone within reach;with bed alarm set   PT Visit Diagnosis: Difficulty in walking, not elsewhere classified (R26.2);Unsteadiness on feet  (R26.81);Other symptoms and signs involving the nervous system (R29.898);Muscle weakness (generalized) (M62.81);History of falling (Z91.81)    Time: 1040-1105 PT Time Calculation (min) (ACUTE ONLY): 25 min   Charges:   PT Evaluation $PT Eval Low Complexity: 1 Low PT Treatments $Gait Training: 8-22 mins       Kristen U PT, DPT, CBIS  Supplemental Physical Therapist [place]    Pager [phone] Acute Rehab Office [phone]    

## 2018-11-14 NOTE — Care Management Obs Status (Signed)
[facility] NOTIFICATION   Patient Details  Name: [name] MRN: [id number] Date of Birth: 1931/07/01   Medicare Observation Status Notification Given:       Alberteen Sam, Marlinda Mike 11/14/2018, 4:33 PM

## 2018-11-14 NOTE — TOC Transition Note (Signed)
Transition of Care [facility]) - CM/SW Discharge Note   Patient Details  Name: [name] MRN: [id number] Date of Birth: 11/26/1931  Transition of Care [facility]) CM/SW Contact:  Alberteen Sam, LCSW Phone Number: 11/14/2018, 5:01 PM   Clinical Narrative:     Patient will DC to: [facility] Anticipated DC date: 11/14/2018 Family notified:Frankie Transport [id number]  Per MD patient ready for DC to [facility]. RN, patient, patient's family, and facility notified of DC. Discharge Summary sent to facility. RN given number for report  [phone]. DC packet on chart. Ambulance transport requested for patient.  CSW signing off.  [place]    Final next level of care: Assisted Living Barriers to Discharge: No Barriers Identified   Patient Goals and CMS Choice   CMS Medicare.gov Compare Post Acute Care list provided to:: Patient Represenative (must comment)(Frankie (sister)) Choice offered to / list presented to : Sibling  Discharge Placement              Patient chooses bed at: [facility] [place] Patient to be transferred to facility by: [place] Name of family member notified: Frankie Patient and family notified of of transfer: 11/14/18  Discharge Plan and Services                                     Social Determinants of Health (SDOH) Interventions     Readmission Risk Interventions No flowsheet data found.

## 2018-11-14 NOTE — Progress Notes (Signed)
Patient waiting on PTAR Dressed, IV out, teli d/c Personal belongings packed, report called. Documents and AVS ready

## 2018-11-14 NOTE — Discharge Summary (Signed)
Physician Discharge Summary  Lula Portocarrero Savich [id number] DOB: 18-Dec-1931 DOA: 11/13/2018  PCP: Lajean Manes, MD  Admit date: 11/13/2018 Discharge date: 11/14/2018  Admitted From: Assisted Living [facility] Disposition:  [facility]   Recommendations for Outpatient Follow-up:  1. Follow up with Dr. Alyson Ingles in 4-7 days 2. Dr. Felipa Eth or PCP at [facility]: Please obtain CBC in 1 week 3. Follow up with Cardiology as previously scheduled      Home Health: PT/OT  Equipment/Devices: None new  Discharge Condition: Fair  CODE STATUS: DO NOT RESUSCITATE Diet recommendation: Cardiac  Brief/Interim Summary: Mr. Tsuda is a 85 y.o. M with hx CVA, prosCa s/p radX, Parkinsons disease, CAD s/p STEMI 2019 Jul, hypothyroidism, HTN, DM, and Afib on Eliquis and CKD III baseline 1.3 who presented with dizziness, lightheadedness as if he might pass out and chest tightness while riding a bike the day of admission.  In the ER, ECG showed Afib with RVR.  BP soft.  Hgb 6.8 g/dL, hsTrop 22.  FOBT negative.  He was transfused 1 unit PRBCs and converted to sinus rhythm.      PRINCIPAL HOSPITAL DIAGNOSIS: Chronic blood loss anemia causing atrial fibrillation with RVR    Discharge Diagnoses:   Atrial fibrillation with RVR Patient started on diltiazem gtt and given 1 unit PRBCs.  Reverted to sinus rhythm.  Restarted home diltiazem and metoprolol and remained in sinus rhythm.    Follow up with Cardiology as previously scheduled.    Anemia, chronic blood loss Transfused 1 unit overnight.  Hgb increased to mid-8s and was stable on repeat.  FOBT negative x2.    No melena or hematochezia or hematemesis.  Urine appeared tea-colored, and urinalysis was obtained that showed >50 RBCs per hpf.  Discussed with Dr. Alyson Ingles who is familiar with the patient, agreed with close outpatient follow up, will arrange imaging or cystoscopy as indicated. -HOLD ELIQUIS and PLAVIX until cleared by  urology -Given his CHA2DS2-Vasc score of 7, the amount of time off anticoagulation should be limited as much as possible, preferably no more than 1-2 weeks -Start iron on 11/3   History of renal stones No flank pain to suggest stone.  No fever, vomiting, leukocytosis to suggest infected hydronephrosis.  -Follow up with Urology  Cerebrovascular disease Coronary disease secondary prevention Hypertension His last DES was July 2019, now completed 12 months Plavix.  May hold 2 weeks.  Parkinsons with dementia  Diabetes with neuropathy  CKD III Baseline near 1.3, stable relative to baseline  Elevated troponin Demand ischemia.  Hypothyroidism   BPH and urinary retention  Mood                Discharge Instructions  Discharge Instructions    Amb referral to AFIB Clinic   Complete by: As directed    Discharge instructions   Complete by: As directed    Follow up with Dr. Aline August at [facility] asap. Call them to confirm your date and time of appointment.  Stop taking your Eliquis and Plavix until you have seen Dr. Aline August and he has told you to restart it.   Increase activity slowly   Complete by: As directed      Allergies as of 11/14/2018      Reactions   Colchicine Anaphylaxis   Hibiclens [chlorhexidine] Hives   Atorvastatin Other (See Comments)   Joint pain    Ceftin Diarrhea   Celebrex [celecoxib] Swelling   Codeine Hives   Erythromycin Diarrhea   Ezetimibe Other (  See Comments)   Joint pain    Iodinated Diagnostic Agents Nausea And Vomiting, Rash   Oxycodone-acetaminophen Hives   Rosuvastatin Other (See Comments)   Joint pain    Simvastatin Other (See Comments)   Joint pain    Cefpodoxime Rash   Cefuroxime Axetil Rash   Nitroglycerin Other (See Comments)   Lowers patient's B/P   Tetanus Toxoid Rash   Vantin Other (See Comments)   Unknown/not noted on MAR??      Medication List    STOP taking these medications   apixaban  2.5 MG Tabs tablet Commonly known as: ELIQUIS   clopidogrel 75 MG tablet Commonly known as: PLAVIX     TAKE these medications   alfuzosin 10 MG 24 hr tablet Commonly known as: UROXATRAL Take 1 tablet (10 mg total) by mouth at bedtime. What changed: when to take this   benzonatate 100 MG capsule Commonly known as: TESSALON Take 100 mg by mouth 3 (three) times daily as needed for cough.   brinzolamide 1 % ophthalmic suspension Commonly known as: AZOPT Place 1 drop into the right eye 3 (three) times daily.   carbidopa-levodopa 25-100 MG tablet Commonly known as: SINEMET IR Take 1 tablet by mouth See admin instructions. Take 1 tablet by mouth three times a day- 8 AM, 2 PM, and 8 PM   colesevelam 625 MG tablet Commonly known as: WELCHOL Take 1,875 mg by mouth daily.   Combigan 0.2-0.5 % ophthalmic solution Generic drug: brimonidine-timolol Place 1 drop into both eyes 2 (two) times daily.   diltiazem 120 MG 24 hr capsule Commonly known as: CARDIZEM CD Take 1 capsule (120 mg total) by mouth daily for 30 days.   docusate sodium 100 MG capsule Commonly known as: COLACE Take 100 mg by mouth 2 (two) times daily.   ferrous sulfate 325 (65 FE) MG EC tablet Take 1 tablet (325 mg total) by mouth 2 (two) times daily with a meal.   fluticasone 50 MCG/ACT nasal spray Commonly known as: FLONASE Place 2 sprays into both nostrils daily.   furosemide 20 MG tablet Commonly known as: LASIX Take 20 mg by mouth daily.   gabapentin 100 MG capsule Commonly known as: NEURONTIN Take 100 mg by mouth at bedtime.   hydroxypropyl methylcellulose / hypromellose 2.5 % ophthalmic solution Commonly known as: ISOPTO TEARS / GONIOVISC Place 1 drop into both eyes 4 (four) times daily.   latanoprost 0.005 % ophthalmic solution Commonly known as: XALATAN Place 1 drop into the right eye at bedtime.   levocetirizine 5 MG tablet Commonly known as: XYZAL Take 5 mg by mouth at bedtime.    levothyroxine 150 MCG tablet Commonly known as: SYNTHROID Take 150 mcg by mouth daily before breakfast.   Melatonin 3 MG Subl Place 3 mg under the tongue at bedtime.   menthol-cetylpyridinium 3 MG lozenge Commonly known as: CEPACOL Take 1 lozenge by mouth as needed for sore throat.   metoprolol succinate 25 MG 24 hr tablet Commonly known as: TOPROL-XL Take 0.5 tablets (12.5 mg total) by mouth daily for 30 days.   mirabegron ER 50 MG Tb24 tablet Commonly known as: MYRBETRIQ Take 1 tablet (50 mg total) by mouth daily.   polyethylene glycol 17 g packet Commonly known as: MIRALAX / GLYCOLAX Take 17 g by mouth daily. MIX AND DRINK   potassium chloride 10 MEQ tablet Commonly known as: KLOR-CON Take 10 mEq by mouth daily.   PreserVision/Lutein Caps Take 1 capsule by mouth 2 (two)  times daily.   sertraline 25 MG tablet Commonly known as: ZOLOFT Take 25 mg by mouth daily.   sitaGLIPtin 50 MG tablet Commonly known as: Januvia Take 1 tablet (50 mg total) by mouth daily.   Uribel 118 MG Caps Take 118 mg by mouth 3 (three) times daily.   Vitamin D-3 125 MCG (5000 UT) Tabs Take 5,000 Units by mouth daily.      Follow-up Information    McKenzie, Candee Furbish, MD. Call in 1 day(s).   Specialty: Urology Why: Call for an appointment asap Contact information: [address] [place] [address] [phone]          Allergies  Allergen Reactions  . Colchicine Anaphylaxis  . Hibiclens [Chlorhexidine] Hives  . Atorvastatin Other (See Comments)    Joint pain   . Ceftin Diarrhea  . Celebrex [Celecoxib] Swelling  . Codeine Hives  . Erythromycin Diarrhea  . Ezetimibe Other (See Comments)    Joint pain   . Iodinated Diagnostic Agents Nausea And Vomiting and Rash  . Oxycodone-Acetaminophen Hives  . Rosuvastatin Other (See Comments)    Joint pain   . Simvastatin Other (See Comments)    Joint pain   . Cefpodoxime Rash  . Cefuroxime Axetil Rash  . Nitroglycerin Other  (See Comments)    Lowers patient's B/P  . Tetanus Toxoid Rash  . Vantin Other (See Comments)    Unknown/not noted on MAR??    Consultations:  Urology, Dr. Alyson Ingles by phone   Procedures/Studies: Dg Chest Port 1 View  Result Date: 11/13/2018 CLINICAL DATA:  Atrial fibrillation, weakness. EXAM: PORTABLE CHEST 1 VIEW COMPARISON:  Chest x-rays dated 03/10/2018 and 08/13/2017. FINDINGS: Heart size and mediastinal contours are stable. Lungs are clear. No pleural effusion or pneumothorax is seen. Osseous structures about the chest are unremarkable. IMPRESSION: No active disease. No evidence of pneumonia or pulmonary edema. Electronically Signed   By: Franki Cabot M.D.   On: 11/13/2018 10:33      Subjective: Feeling well.  No flank pain, vomiting, nausea.  No confusion, weakness.  Discharge Exam: Vitals:   11/14/18 1207 11/14/18 1518  BP: (!) 173/96 (!) 158/91  Pulse: 79 71  Resp: 18 20  Temp: 98 F (36.7 C)   SpO2:  98%   Vitals:   11/14/18 0412 11/14/18 0751 11/14/18 1207 11/14/18 1518  BP: (!) 176/97 (!) 185/98 (!) 173/96 (!) 158/91  Pulse: 73 74 79 71  Resp: 18 18 18 20   Temp: 98.1 F (36.7 C) 98.7 F (37.1 C) 98 F (36.7 C)   TempSrc: Oral Oral    SpO2: 93% 98%  98%  Weight:      Height:        General: Pt is alert, awake, not in acute distress Cardiovascular: RRR, nl S1-S2, no murmurs appreciated.   No LE edema.   Respiratory: Normal respiratory rate and rhythm.  CTAB without rales or wheezes. Abdominal: Abdomen soft and non-tender.  No distension or HSM.   Neuro/Psych: Strength symmetric in upper and lower extremities.  Judgment and insight appear moderately impaired by dementia.   The results of significant diagnostics from this hospitalization (including imaging, microbiology, ancillary and laboratory) are listed below for reference.     Microbiology: Recent Results (from the past 240 hour(s))  SARS CORONAVIRUS 2 (TAT 6-24 HRS) Nasopharyngeal  Nasopharyngeal Swab     Status: None   Collection Time: 11/13/18  1:39 PM   Specimen: Nasopharyngeal Swab  Result Value Ref Range Status  SARS Coronavirus 2 NEGATIVE NEGATIVE Final    Comment: (NOTE) SARS-CoV-2 target nucleic acids are NOT DETECTED. The SARS-CoV-2 RNA is generally detectable in upper and lower respiratory specimens during the acute phase of infection. Negative results do not preclude SARS-CoV-2 infection, do not rule out co-infections with other pathogens, and should not be used as the sole basis for treatment or other patient management decisions. Negative results must be combined with clinical observations, patient history, and epidemiological information. The expected result is Negative. Fact Sheet for Patients: SugarRoll.be Fact Sheet for Healthcare Providers: https://www.woods-mathews.com/ This test is not yet approved or cleared by the [place] FDA and  has been authorized for detection and/or diagnosis of SARS-CoV-2 by FDA under an Emergency Use Authorization (EUA). This EUA will remain  in effect (meaning this test can be used) for the duration of the COVID-19 declaration under Section 56 4(b)(1) of the Act, 21 U.S.C. section 360bbb-3(b)(1), unless the authorization is terminated or revoked sooner. Performed at [facility], [place] [address], [place], [place] [address]      Labs: BNP (last 3 results) No results for input(s): BNP in the last 8760 hours. Basic Metabolic Panel: Recent Labs  Lab 11/13/18 0932 11/14/18 0555  NA 138 138  K 4.3 4.1  CL 107 109  CO2 22 22  GLUCOSE 124* 85  BUN 24* 25*  CREATININE 1.32* 1.30*  CALCIUM 8.4* 8.5*  MG 2.1  --    Liver Function Tests: Recent Labs  Lab 11/13/18 0932  AST 9*  ALT <5  ALKPHOS 61  BILITOT 0.5  PROT 5.5*  ALBUMIN 3.1*   No results for input(s): LIPASE, AMYLASE in the last 168 hours. No results for input(s): AMMONIA in the last 168  hours. CBC: Recent Labs  Lab 11/13/18 0932 11/13/18 1801 11/14/18 0555  WBC 6.7  --  8.3  HGB 6.8* 8.4* 8.7*  HCT 23.4* 28.9* 29.0*  MCV 75.0*  --  74.4*  PLT 255  --  282   Cardiac Enzymes: No results for input(s): CKTOTAL, CKMB, CKMBINDEX, TROPONINI in the last 168 hours. BNP: Invalid input(s): POCBNP CBG: No results for input(s): GLUCAP in the last 168 hours. D-Dimer No results for input(s): DDIMER in the last 72 hours. Hgb A1c No results for input(s): HGBA1C in the last 72 hours. Lipid Profile No results for input(s): CHOL, HDL, LDLCALC, TRIG, CHOLHDL, LDLDIRECT in the last 72 hours. Thyroid function studies No results for input(s): TSH, T4TOTAL, T3FREE, THYROIDAB in the last 72 hours.  Invalid input(s): FREET3 Anemia work up Recent Labs    11/14/18 0829  FERRITIN 7*  TIBC 351  IRON 26*   Urinalysis    Component Value Date/Time   COLORURINE YELLOW 11/14/2018 1040   APPEARANCEUR CLOUDY (A) 11/14/2018 1040   LABSPEC 1.012 11/14/2018 1040   PHURINE 6.0 11/14/2018 1040   GLUCOSEU NEGATIVE 11/14/2018 1040   HGBUR LARGE (A) 11/14/2018 1040   BILIRUBINUR NEGATIVE 11/14/2018 1040   KETONESUR NEGATIVE 11/14/2018 1040   PROTEINUR 100 (A) 11/14/2018 1040   UROBILINOGEN 0.2 05/26/2014 2040   NITRITE NEGATIVE 11/14/2018 1040   LEUKOCYTESUR LARGE (A) 11/14/2018 1040   Sepsis Labs Invalid input(s): PROCALCITONIN,  WBC,  LACTICIDVEN Microbiology Recent Results (from the past 240 hour(s))  SARS CORONAVIRUS 2 (TAT 6-24 HRS) Nasopharyngeal Nasopharyngeal Swab     Status: None   Collection Time: 11/13/18  1:39 PM   Specimen: Nasopharyngeal Swab  Result Value Ref Range Status   SARS Coronavirus 2 NEGATIVE  NEGATIVE Final    Comment: (NOTE) SARS-CoV-2 target nucleic acids are NOT DETECTED. The SARS-CoV-2 RNA is generally detectable in upper and lower respiratory specimens during the acute phase of infection. Negative results do not preclude SARS-CoV-2 infection, do not  rule out co-infections with other pathogens, and should not be used as the sole basis for treatment or other patient management decisions. Negative results must be combined with clinical observations, patient history, and epidemiological information. The expected result is Negative. Fact Sheet for Patients: SugarRoll.be Fact Sheet for Healthcare Providers: https://www.woods-mathews.com/ This test is not yet approved or cleared by the [place] FDA and  has been authorized for detection and/or diagnosis of SARS-CoV-2 by FDA under an Emergency Use Authorization (EUA). This EUA will remain  in effect (meaning this test can be used) for the duration of the COVID-19 declaration under Section 56 4(b)(1) of the Act, 21 U.S.C. section 360bbb-3(b)(1), unless the authorization is terminated or revoked sooner. Performed at [facility], [place] [address] [place], [place] [address]      Time coordinating discharge: 40 minutes      SIGNED:   Edwin Dada, MD  [facility] 11/14/2018, 4:26 PM

## 2018-11-14 NOTE — Progress Notes (Signed)
D/c teli per MD order

## 2018-11-14 NOTE — Progress Notes (Signed)
D/c order placed, contacted SW for d/c

## 2018-11-14 NOTE — Progress Notes (Signed)
PROGRESS NOTE    [name]  [id number] DOB: Feb 04, 1931 DOA: 11/13/2018 PCP: Lajean Manes, MD      Brief Narrative:  Mr. Ceresa is a 85 y.o. M with hx CVA, prosCa s/p radX, Parkinsons disease, CAD s/p STEMI 2019 Jul, hypothyroidism, HTN, DM, and Afib on Eliquis and CKD III baseline 1.3 who presented with dizziness, lightheadedness as if he might pass out and chest tightness while riding a bike the day of admission.  In the ER, ECG showed Afib with RVR.  BP soft.  Hgb 6.8 g/dL, hsTrop 22.  FOBT negative.  He was transfused 1 unit PRBCs and converted to sinus rhythm.      Assessment & Plan:  Atrial fibrillation with RVR HR controleld overnight, sinus rhythm now. -Resume metop and diltiazem -Stop Dilt gtt -Hold Eliquis for now   Anemia Transfused 1 unit overnight.  Hgb now mid 8s.  FOBT negative x2 here butnursing note dark urine today.  Patient denies history of GI or urinary blood loss.  -Hold Eliquis and Plavix for now -WIll obtain UA --> if many RBCs, will start with CT urogram with contrast and call Urology -Check iron studies  Cerebrovascular disease Coronary disease secondary prevention Hypertension BP elevated -Resume metoprolol, diltiazem, furosemide -Hold Welchol -Hold Plavix  Parkinsons with dementia -Continue Sinemet  Diabetes with neuropathy Glucoses controlled -Continue home DPPIV -Continue gabapentin  CKD III Baseline near 1.3, stable relative to baseline  Elevated troponin Demand ischemia.  Hypothyroidism  -Continue levothyroxine  BPH and urinary retention -Continue home Alfuzosin and Mirabegron  Mood  -Continue sertraline          MDM and disposition: The below labs and imaging reports were reviewed and summarized above.  Medication management as above.  The patient was admitted with anemia resulting in A. fib with RVR.  The atrial fibrillation is resolved he is back in sinus rhythm.  All clinical signs are pointing  towards a urinary loss of blood.  Confirmed this with urinalysis today, and if so probably work-up as an outpatient.      DVT prophylaxis: SCDs Code Status: DNR Family Communication:     Consultants:     Procedures:     Antimicrobials:       Subjective: Dark urine this morning.  No melena hematochezia, hematemesis.  No confusion, chest discomfort, palpitations, blacking out, loss of consciousness.  No dyspnea.  Objective: Vitals:   11/14/18 0027 11/14/18 0336 11/14/18 0412 11/14/18 0751  BP: (!) 164/98  (!) 176/97 (!) 185/98  Pulse: 72  73 74  Resp: 16  18 18   Temp: 98.9 F (37.2 C)  98.1 F (36.7 C) 98.7 F (37.1 C)  TempSrc: Oral  Oral Oral  SpO2: 95%  93% 98%  Weight:  70.5 kg    Height:        Intake/Output Summary (Last 24 hours) at 11/14/2018 0809 Last data filed at 11/14/2018 0500 Gross per 24 hour  Intake 605 ml  Output 720 ml  Net -115 ml   Filed Weights   11/13/18 1558 11/14/18 0336  Weight: 72.3 kg 70.5 kg    Examination: General appearance: Elderly adult male, alert and in no acute distress.  Lying in bed. HEENT: Anicteric, conjunctiva pink, lids and lashes normal. No nasal deformity, discharge, epistaxis.  Lips moist, dentition normal, oropharynx moist, no oral lesions, hearing diminished.   Skin: Warm and dry.  Pale, no jaundice.  No suspicious rashes or lesions. Cardiac: RRR, nl S1-S2, no murmurs appreciated.  Capillary refill is brisk.  JVP normal.  No LE edema.  Radia  pulses 2+ and symmetric. Respiratory: Normal respiratory rate and rhythm.  CTAB without rales or wheezes. Abdomen: Abdomen soft.  No TTP or guarding. No ascites, distension, hepatosplenomegaly.   MSK: No deformities or effusions. Neuro: Awake and alert.  EOMI, moves all extremities with slow movements but symmetric and strength appears normal. Speech slow but fluent.  Bradykinesia noted.  No tremor. Psych: Sensorium intact and responding to questions, attention normal.  Affect blunted.  Judgment and insight appear impaired.  States he is at [employer], states the year is " one twenty four".    Data Reviewed: I have personally reviewed following labs and imaging studies:  CBC: Recent Labs  Lab 11/13/18 0932 11/13/18 1801 11/14/18 0555  WBC 6.7  --  8.3  HGB 6.8* 8.4* 8.7*  HCT 23.4* 28.9* 29.0*  MCV 75.0*  --  74.4*  PLT 255  --  [id number]   Basic Metabolic Panel: Recent Labs  Lab 11/13/18 0932 11/14/18 0555  NA 138 138  K 4.3 4.1  CL 107 109  CO2 22 22  GLUCOSE 124* 85  BUN 24* 25*  CREATININE 1.32* 1.30*  CALCIUM 8.4* 8.5*  MG 2.1  --    GFR: Estimated Creatinine Clearance: 42.2 mL/min (A) (by C-G formula based on SCr of 1.3 mg/dL (H)). Liver Function Tests: Recent Labs  Lab 11/13/18 0932  AST 9*  ALT <5  ALKPHOS 61  BILITOT 0.5  PROT 5.5*  ALBUMIN 3.1*   No results for input(s): LIPASE, AMYLASE in the last 168 hours. No results for input(s): AMMONIA in the last 168 hours. Coagulation Profile: No results for input(s): INR, PROTIME in the last 168 hours. Cardiac Enzymes: No results for input(s): CKTOTAL, CKMB, CKMBINDEX, TROPONINI in the last 168 hours. BNP (last 3 results) No results for input(s): PROBNP in the last 8760 hours. HbA1C: No results for input(s): HGBA1C in the last 72 hours. CBG: No results for input(s): GLUCAP in the last 168 hours. Lipid Profile: No results for input(s): CHOL, HDL, LDLCALC, TRIG, CHOLHDL, LDLDIRECT in the last 72 hours. Thyroid Function Tests: No results for input(s): TSH, T4TOTAL, FREET4, T3FREE, THYROIDAB in the last 72 hours. Anemia Panel: No results for input(s): VITAMINB12, FOLATE, FERRITIN, TIBC, IRON, RETICCTPCT in the last 72 hours. Urine analysis:    Component Value Date/Time   COLORURINE GREEN (A) 08/13/2017 0937   APPEARANCEUR HAZY (A) 08/13/2017 0937   LABSPEC 1.020 08/13/2017 0937   PHURINE 7.0 08/13/2017 0937   GLUCOSEU NEGATIVE 08/13/2017 0937   HGBUR TRACE (A)  08/13/2017 0937   BILIRUBINUR NEGATIVE 08/13/2017 0937   KETONESUR NEGATIVE 08/13/2017 0937   PROTEINUR 100 (A) 08/13/2017 0937   UROBILINOGEN 0.2 05/26/2014 2040   NITRITE NEGATIVE 08/13/2017 0937   LEUKOCYTESUR TRACE (A) 08/13/2017 0937   Sepsis Labs: @LABRCNTIP (procalcitonin:4,lacticacidven:4)  ) Recent Results (from the past 240 hour(s))  SARS CORONAVIRUS 2 (TAT 6-24 HRS) Nasopharyngeal Nasopharyngeal Swab     Status: None   Collection Time: 11/13/18  1:39 PM   Specimen: Nasopharyngeal Swab  Result Value Ref Range Status   SARS Coronavirus 2 NEGATIVE NEGATIVE Final    Comment: (NOTE) SARS-CoV-2 target nucleic acids are NOT DETECTED. The SARS-CoV-2 RNA is generally detectable in upper and lower respiratory specimens during the acute phase of infection. Negative results do not preclude SARS-CoV-2 infection, do not rule out co-infections with other pathogens, and should not be used as the sole basis for  treatment or other patient management decisions. Negative results must be combined with clinical observations, patient history, and epidemiological information. The expected result is Negative. Fact Sheet for Patients: SugarRoll.be Fact Sheet for Healthcare Providers: https://www.woods-mathews.com/ This test is not yet approved or cleared by the [place] FDA and  has been authorized for detection and/or diagnosis of SARS-CoV-2 by FDA under an Emergency Use Authorization (EUA). This EUA will remain  in effect (meaning this test can be used) for the duration of the COVID-19 declaration under Section 56 4(b)(1) of the Act, 21 U.S.C. section 360bbb-3(b)(1), unless the authorization is terminated or revoked sooner. Performed at [facility], [place] [address] [place], [place] [address]          Radiology Studies: Dg Chest Port 1 View  Result Date: 11/13/2018 CLINICAL DATA:  Atrial fibrillation, weakness. EXAM: PORTABLE  CHEST 1 VIEW COMPARISON:  Chest x-rays dated 03/10/2018 and 08/13/2017. FINDINGS: Heart size and mediastinal contours are stable. Lungs are clear. No pleural effusion or pneumothorax is seen. Osseous structures about the chest are unremarkable. IMPRESSION: No active disease. No evidence of pneumonia or pulmonary edema. Electronically Signed   By: Franki Cabot M.D.   On: 11/13/2018 10:33        Scheduled Meds: . sodium chloride flush  3 mL Intravenous Q12H   Continuous Infusions: . sodium chloride    . diltiazem (CARDIZEM) infusion Stopped (11/13/18 1527)     LOS: 0 days    Time spent: 35 minutes    Edwin Dada, MD [facility] 11/14/2018, 8:09 AM     Please page through [facility]:  [email or web address] Password [email or web address] If 7PM-7AM, please contact night-coverage

## 2018-11-14 NOTE — Progress Notes (Signed)
Patient received one unit of blood, second one was ordered however not given yet. Spoke with MD Danford, no need for second unit to be given.

## 2018-11-14 NOTE — Progress Notes (Signed)
Called [facility] for report, report given to Lovelady, Therapist, sports

## 2018-11-14 NOTE — TOC Initial Note (Signed)
Transition of Care [facility]) - Initial/Assessment Note    Patient Details  Name: [name] MRN: [id number] Date of Birth: 1931-05-20  [facility] of [facility] CM/SW Contact:    Alberteen Sam, [place] Phone Number: [phone] 11/14/2018, 4:58 PM  Clinical Narrative:                  CSW spoke with patient's sister [name] who reports patient from [facility] and she is prepared for him to return today after speaking with Dr. Loleta Books. CSW confirmed patient has rolling walker and scooter at home.   CSW spoke to [facility], all clinicals requested have been faxed and they are prepared to take patient today according to [facility]. Dottie states she just need home health orders and they are able to set patient up with PT And OT at their facility, CSW has faxed to her. Dottie also reports no FL2 needed as patient is in obs status, obs note faxed to [facility]. No further needs at this time.   Expected Discharge Plan: Assisted Living Barriers to Discharge: No Barriers Identified   Patient Goals and CMS Choice   CMS Medicare.gov Compare Post Acute Care list provided to:: Patient Represenative (must comment)(sister [name]) Choice offered to / list presented to : Sibling  Expected Discharge Plan and Services Expected Discharge Plan: Assisted Living       Living arrangements for the past 2 months: [place] Expected Discharge Date: 11/14/18                                    Prior Living Arrangements/Services Living arrangements for the past 2 months: [place] Lives with:: Self Patient language and need for interpreter reviewed:: Yes Do you feel safe going back to the place where you live?: Yes      Need for Family Participation in Patient Care: Yes (Comment) Care giver support system in place?: Yes (comment)   Criminal Activity/Legal Involvement Pertinent to Current Situation/Hospitalization: No - Comment as needed  Activities of Daily  Living Home Assistive Devices/Equipment: Cane (specify quad or straight), Walker (specify type) ADL Screening (condition at time of admission) Patient's cognitive ability adequate to safely complete daily activities?: Yes Is the patient deaf or have difficulty hearing?: Yes Does the patient have difficulty seeing, even when wearing glasses/contacts?: No Does the patient have difficulty concentrating, remembering, or making decisions?: No Patient able to express need for assistance with ADLs?: Yes Does the patient have difficulty dressing or bathing?: Yes Independently performs ADLs?: No Communication: Independent Dressing (OT): Needs assistance Is this a change from baseline?: Pre-admission baseline Grooming: Needs assistance Is this a change from baseline?: Pre-admission baseline Feeding: Independent Bathing: Needs assistance Is this a change from baseline?: Pre-admission baseline Toileting: Needs assistance Is this a change from baseline?: Pre-admission baseline In/Out Bed: Needs assistance Is this a change from baseline?: Pre-admission baseline Walks in Home: Needs assistance Is this a change from baseline?: Pre-admission baseline Does the patient have difficulty walking or climbing stairs?: Yes Weakness of Legs: Both Weakness of Arms/Hands: Both  Permission Sought/Granted Permission sought to share information with : Case Manager, Customer service manager, Family Supports Permission granted to share information with : Yes, Verbal Permission Granted  Share Information with NAME: [name]  Permission granted to share info w AGENCY: ALF  Permission granted to share info w Relationship: sister  Permission granted to share info w Contact Information: [phone]  Emotional Assessment   Attitude/Demeanor/Rapport: Unable to Assess Affect (typically observed): Unable to Assess Orientation: : Oriented to Self, Oriented to Situation, Oriented to Place, Oriented to   Time Alcohol / Substance Use: Not Applicable Psych Involvement: No (comment)  Admission diagnosis:  Atrial fibrillation with RVR (HCC) [I48.91] Symptomatic anemia [D64.9] Patient Active Problem List   Diagnosis Date Noted  . Symptomatic anemia 11/13/2018  . Parkinson's disease ([place]) 11/13/2018  . AF (paroxysmal atrial fibrillation) ([place])   . Coronary artery disease involving native coronary artery of native heart without angina pectoris   . Hyperlipidemia LDL goal <70   . Urinary tract infection with hematuria   . Non-ST elevation (NSTEMI) myocardial infarction ([place])   . GERD (gastroesophageal reflux disease) 08/04/2017  . Elevated troponin 08/03/2017  . CKD (chronic kidney disease), stage III 08/03/2017  . Type II diabetes mellitus with renal manifestations ([place]) 08/03/2017  . Pseudophakia of both eyes 05/31/2017  . Retained ureteral stent 04/22/2016  . Ureteral calculus 01/25/2016  . Benign essential HTN 10/24/2015  . CVA (cerebral vascular accident) ([place]) 10/14/2014  . TIA (transient ischemic attack)   . CME (cystoid macular edema), left 08/10/2013  . Squamous cell carcinoma of eyelid 05/15/2013  . Blepharitis 03/16/2013  . Posterior vitreous detachment of right eye 10/23/2011  . Exudative age-related macular degeneration ([place]) 07/28/2011  . Nonproliferative diabetic retinopathy ([place]) 04/07/2011  . Insomnia, unspecified 05/19/2010  . ATAXIA 03/17/2010  . DIZZINESS 03/06/2010  . Chronic constipation 02/21/2008  . OSTEOARTHRITIS, KNEE, SEVERE 01/12/2008  . Hypothyroidism (post surgical) 10/15/2006  . Diabetes mellitus, insulin dependent (IDDM), controlled 04/22/2006  . URTICARIA 04/22/2006  . POPLITEAL CYST 04/22/2006  . THYROIDECTOMY, SUBTOTAL, HX OF 04/22/2006  . TOTAL KNEE REPLACEMENT, HX OF 04/22/2006   PCP:  Lajean Manes, MD Pharmacy:  No Pharmacies Listed    Social Determinants of Health (SDOH) Interventions    Readmission Risk Interventions No flowsheet  data found.

## 2018-11-17 ENCOUNTER — Telehealth: Payer: Self-pay | Admitting: Cardiology

## 2018-11-17 NOTE — Telephone Encounter (Signed)
Returned call to [employer] at [employer]. She states that the patient was recently in the hospital with symptomatic anemia. Patient's hemoglobin was 6.8 and he was transfused and it came up to 8.4. The patient's Eliquis and Plavix were both stopped until cleared by urology to restart. Dottie states that they have been working on getting an appointment with urology but have not been able to do so yet. Patient saw PCP yesterday and will have another CBC checked next Friday. Dottie wanting to know how long it is okay for the patient to be off of his Eliquis. She is also wanting to know if there is a cheaper option for when he restarts. Made her aware that I will forward to Dr. Radford Pax for review and recommendation.

## 2018-11-17 NOTE — Telephone Encounter (Signed)
PCP and Urology need to determine when it is safe to go back on anticoagulation from urologic and anemia standpoint.  Please forward to PharmD to investigate which DOAC is cheapest

## 2018-11-17 NOTE — Telephone Encounter (Signed)
Pt c/o medication issue:  1. Name of Medication: Eliquis  2. How are you currently taking this medication (dosage and times per day)? As directed  3. Are you having a reaction (difficulty breathing--STAT)? no  4. What is your medication issue? Dottie from [employer] states that the patient is taking Eliquis but was wondering if there was an alternative since the co-pay is so high. States the patient has been taking the medication since he was discharged from the hospital in February.c

## 2018-11-18 NOTE — Telephone Encounter (Signed)
If patient still has the [employer] he may be eligible for the $10 coupon. I tried calling the patient but he answered and then I couldn't hear anything. I called and left a voicemail for dottie since she was the one who called the office initially for more information about where he is filling his prescriptions

## 2018-11-21 ENCOUNTER — Other Ambulatory Visit: Payer: Self-pay

## 2018-11-21 ENCOUNTER — Encounter: Payer: Self-pay | Admitting: Internal Medicine

## 2018-11-21 ENCOUNTER — Non-Acute Institutional Stay: Payer: Medicare Other | Admitting: Internal Medicine

## 2018-11-21 DIAGNOSIS — G20 Parkinson's disease: Secondary | ICD-10-CM

## 2018-11-21 DIAGNOSIS — Z515 Encounter for palliative care: Secondary | ICD-10-CM

## 2018-11-21 NOTE — Progress Notes (Signed)
Nov 9th, 2020 [facility] Palliative Care Consult Note Telephone: [phone]  Fax: [phone]  PATIENT NAME: [name] DOB: 1931-11-02 MRN: [id number] [facility] [address] (move in date 04/30/2016)  PRIMARY CARE PROVIDER:  Lajean Manes, MD [address] [place] [address]  Dr. Aline August ([facility]) [phone]  Dr. Virgina Organ (Cardiology)   REFERRING PROVIDER: Delle Reining (referred 11/17/2018)  RESPONSIBLE PARTY: (sister) [facility] [phone]. ([name]) [name] [phone]  No melena or hematochezia or hematemesis.  Urine appeared tea-colored, and urinalysis was obtained that showed >50 RBCs per hpf.  Discussed with Dr. Alyson Ingles who is familiar with the patient, agreed with close outpatient follow up, will arrange imaging or cystoscopy as indicated. -HOLD ELIQUIS and PLAVIX until cleared by urology -Given his CHA2DS2-Vasc score of 7, the amount of time off anticoagulation should be limited as much as possible, preferably no more than 1-2 weeks -Start iron on 11/3  ASSESSMENT / RECOMMENDATIONS:  1. Advance Care Planning:  A. Directives: DNR (present in facility chart; uploaded into [facility]/EMR  B. Goals of Care: Awaiting work up by urologist to identify etiology of anemia (urine with RBCs). Eliquis and Plavix on hold till cleared by urology.  2.Cognitive / Functional status: Patient is A & O. Ambulates with walker for stability. Tries to keep up a regular exercise regimen ambulating up and down hallways as tolerated; morning use of exercise bike. Independent in ADLs. Appetite okay. Weight stable at 157lbs. At a height of 5'10" his BMi is 22.64 kg/m2. Mild constipation well managed with colace and MiraLax. Denies pain nor dyspnea. No problems with urination. PT eval pending.   3. Family Supports: Spouse (second marriage) deceased a few years ago; unexpectedly d/t flu. He misses her. Has children from first  marriage not directly engaged. Sister visits with patient's little dog. Enjoys watching sports (mostly base ball) on TV, and keeping physically active as he can.   4. Follow up Palliative Care Visit: 1-2 months.   I spent 60 minutes providing this consultation  from 2pm-3pm. More than 50% of the time in this consultation was spent coordinating communication.   HISTORY OF PRESENT ILLNESS:  [name] is an 85 y.o. M with hx CVA, prosCa s/p radX, BPH, Parkinsons disease (with dementia), CAD (STEMI 2019), hypothyroidism, HTN, DM (neuropathy), Afib (Eliquis) and CKD III (baseline creatine 1.3). Hydronephrosis / nephrolithiasis (stent placement. Prior episodes demand ischemia setting of Afib with RVR. Hospital admission:  11/1-11/02/2018: treated for Hgb 6.8 (tx 1 unit PRBCs). Afib with RVR (converted to NSR). FOBN. Demand ischemia with elevation Troponin.  Palliative Care was asked to help address goals of care.   CODE STATUS: DNR  PPS: 60%  HOSPICE ELIGIBILITY/DIAGNOSIS: TBD  PAST MEDICAL HISTORY:  Past Medical History:  Diagnosis Date  . Allergic rhinitis   . Arthritis    arthritis-kyphosis  . Asthmatic bronchitis   . CKD (chronic kidney disease) stage 3, GFR 30-59 ml/min   . DM type 2 (diabetes mellitus, type 2) ([place])   . DVT, lower extremity, proximal, acute ([place])   . Dyslipidemia   . GERD (gastroesophageal reflux disease)   . Hearing impaired    bilateral hearing aids  . History of kidney stones   . History of skin cancer   . Hypertension   . Hypothyroidism   . Macular degeneration    injections done bilaterally recent - 08-22-13  . PAF (paroxysmal atrial fibrillation) ([place])   . Prostate cancer (  [place])    Prostate cancer-radiation only,skin cancer-basal cell and last squamous cell right eye  . Stroke ([place])   . Urgency-frequency syndrome   . Urticaria     SOCIAL HX:  Social History   Tobacco Use  . Smoking status: Never Smoker  . Smokeless tobacco: Never Used   Substance Use Topics  . Alcohol use: No    ALLERGIES:  Allergies  Allergen Reactions  . Colchicine Anaphylaxis  . Hibiclens [Chlorhexidine] Hives  . Atorvastatin Other (See Comments)    Joint pain   . Ceftin Diarrhea  . Celebrex [Celecoxib] Swelling  . Codeine Hives  . Erythromycin Diarrhea  . Ezetimibe Other (See Comments)    Joint pain   . Iodinated Diagnostic Agents Nausea And Vomiting and Rash  . Oxycodone-Acetaminophen Hives  . Rosuvastatin Other (See Comments)    Joint pain   . Simvastatin Other (See Comments)    Joint pain   . Cefpodoxime Rash  . Cefuroxime Axetil Rash  . Nitroglycerin Other (See Comments)    Lowers patient's B/P  . Tetanus Toxoid Rash  . Vantin Other (See Comments)    Unknown/not noted on MAR??     PERTINENT MEDICATIONS:  Outpatient Encounter Medications as of 11/21/2018  Medication Sig  . alfuzosin (UROXATRAL) 10 MG 24 hr tablet Take 1 tablet (10 mg total) by mouth at bedtime. (Patient taking differently: Take 10 mg by mouth daily. )  . benzonatate (TESSALON) 100 MG capsule Take 100 mg by mouth 3 (three) times daily as needed for cough.  . brimonidine-timolol (COMBIGAN) 0.2-0.5 % ophthalmic solution Place 1 drop into both eyes 2 (two) times daily.  . brinzolamide (AZOPT) 1 % ophthalmic suspension Place 1 drop into the right eye 3 (three) times daily.   . carbidopa-levodopa (SINEMET IR) 25-100 MG tablet Take 1 tablet by mouth See admin instructions. Take 1 tablet by mouth three times a day- 8 AM, 2 PM, and 8 PM  . Cholecalciferol (VITAMIN D-3) 5000 units TABS Take 5,000 Units by mouth daily.  . colesevelam (WELCHOL) 625 MG tablet Take 1,875 mg by mouth daily.   Marland Kitchen diltiazem (CARDIZEM CD) 120 MG 24 hr capsule Take 1 capsule (120 mg total) by mouth daily for 30 days.  Marland Kitchen docusate sodium (COLACE) 100 MG capsule Take 100 mg by mouth 2 (two) times daily.   . ferrous sulfate 325 (65 FE) MG EC tablet Take 1 tablet (325 mg total) by mouth 2 (two) times daily  with a meal.  . fluticasone (FLONASE) 50 MCG/ACT nasal spray Place 2 sprays into both nostrils daily.  . furosemide (LASIX) 20 MG tablet Take 20 mg by mouth daily.  Marland Kitchen gabapentin (NEURONTIN) 100 MG capsule Take 100 mg by mouth at bedtime.  . hydroxypropyl methylcellulose / hypromellose (ISOPTO TEARS / GONIOVISC) 2.5 % ophthalmic solution Place 1 drop into both eyes 4 (four) times daily.  Marland Kitchen latanoprost (XALATAN) 0.005 % ophthalmic solution Place 1 drop into the right eye at bedtime.   Marland Kitchen levocetirizine (XYZAL) 5 MG tablet Take 5 mg by mouth at bedtime.   Marland Kitchen levothyroxine (SYNTHROID) 150 MCG tablet Take 150 mcg by mouth daily before breakfast.   . Melatonin 3 MG SUBL Place 3 mg under the tongue at bedtime.  Marland Kitchen menthol-cetylpyridinium (CEPACOL) 3 MG lozenge Take 1 lozenge by mouth as needed for sore throat.  . Meth-Hyo-M Bl-Na Phos-Ph Sal (URIBEL) 118 MG CAPS Take 118 mg by mouth 3 (three) times daily.   . metoprolol succinate (TOPROL-XL) 25  MG 24 hr tablet Take 0.5 tablets (12.5 mg total) by mouth daily for 30 days.  . mirabegron ER (MYRBETRIQ) 50 MG TB24 tablet Take 1 tablet (50 mg total) by mouth daily.  . Multiple Vitamins-Minerals (PRESERVISION/LUTEIN) CAPS Take 1 capsule by mouth 2 (two) times daily.  . polyethylene glycol (MIRALAX / GLYCOLAX) packet Take 17 g by mouth daily. [place]  . potassium chloride (K-DUR,KLOR-CON) 10 MEQ tablet Take 10 mEq by mouth daily.  . sertraline (ZOLOFT) 25 MG tablet Take 25 mg by mouth daily.  . sitaGLIPtin (JANUVIA) 50 MG tablet Take 1 tablet (50 mg total) by mouth daily.   No facility-administered encounter medications on file as of 11/21/2018.     PHYSICAL EXAM:   General: NAD, frail appearing, slender Cardiovascular: regular rate and rhythm Pulmonary: clear ant fields Abdomen: soft, nontender, + bowel sounds GU: no suprapubic tenderness Extremities: no edema, no joint deformities Skin: no rashes Neurological: Weakness but otherwise nonfocal   Julianne Handler, NP

## 2018-11-24 NOTE — Telephone Encounter (Signed)
Left another message with dottie to discuss the issue with cost and find out what insurance patient has

## 2018-12-16 ENCOUNTER — Telehealth: Payer: Self-pay | Admitting: *Deleted

## 2018-12-16 NOTE — Telephone Encounter (Signed)
Pt has a CHADS2VASc score of 7 (age x2, HTN, DM, CAD, CVA). SCr 1.3, CrCl 39mL/min.  He does not currently have any anticoagulants on his medication list. He was prescribed Eliquis back in February 2020. Phone note 11/17/18 states that pt was in the hospital with anemia and Hgb had dropped to 6.8 so his Eliquis and Plavix were both stopped until he was cleared by urology to restart. Need to clarify if pt has since resumed Eliquis. There was also concern of cost - looks as though 2 messages were left for pt to clarify what insurance type he has in order to determine which DOAC would be cheapest.  If pt has resumed Eliquis, recommend only holding 1 day prior to procedure due to elevated cardiac risk including stroke history.

## 2018-12-16 NOTE — Telephone Encounter (Signed)
Faxed over surgical clearance to [facility].

## 2018-12-16 NOTE — Telephone Encounter (Signed)
Dr. Radford Pax  Can you please make holding recommendations for this patient's Plavix for cystoscopy, ureteroscopy with laser lithotripsy and stent exchange? Requesting team is asking to hold Plavix 7 days prior to procedure as well as Eliquis for 2 days prior to procedure.  Per notes, patient can stay on ASA therapy.  He has a history of with a history of type 2 DM, HTN, CKD, CVA, prostate CA and Parkinson's dz who was admitted 07/2017 with STEMI and cath showed 25% mRCA, 70% oOM1, 80% mLCx and underwent PCI with DES to the LCx and recent DX of atrial fibrillation placed on Eliquis.  Please send your Plavix recommendations to the preop pool.  Pharmacy team evaluating holding recommendations for Eliquis.  Thank you Sharee Pimple

## 2018-12-16 NOTE — Telephone Encounter (Signed)
     Confirmed by patient's assisted living med tech that he has not been receiving his Plavix or Eliquis for approximately 2 to 3 weeks per urology team.   We will send updated information to requesting procedural team.  Pre-op covering staff: - Please contact requesting surgeon's office via preferred method (i.e, phone, fax) to inform them of that his Eliquis and Plavix have been on hold per urology team for 2 to 3 weeks.    Kathyrn Drown, NP 12/16/2018, 4:42 PM

## 2018-12-16 NOTE — Telephone Encounter (Signed)
   [facility] Pre-operative Risk Assessment    Request for surgical clearance:  1. What type of surgery is being performed?  Cystoscopy right retrograde pyelogram, ureteroscopy with laser lithotripsy and stent exchange    2. When is this surgery scheduled?  TBD   3. What type of clearance is required (medical clearance vs. Pharmacy clearance to hold med vs. Both)?  BOTH  4. Are there any medications that need to be held prior to surgery and how long? Plavix X's 7 days & Eliquis X's 2 days prior to surgery   Pt can stay on Aspirin per Dr. Felipa Eth  5. Practice name and name of physician performing surgery?  [facility] / Dr. Felipa Eth   6. What is your office phone number [phone] ext:  [phone]   5.   What is your office fax number [phone]  8.   Anesthesia type (None, local, MAC, general) ? Waiting on call back from [facility] ([place]) re: Anesthesia    [name] 12/16/2018, 12:13 PM  _________________________________________________________________   (provider comments below)

## 2018-12-16 NOTE — Telephone Encounter (Signed)
Please find out if he is taking Plavix or eliquis currently

## 2018-12-19 ENCOUNTER — Other Ambulatory Visit: Payer: Self-pay | Admitting: Urology

## 2018-12-20 NOTE — Progress Notes (Signed)
PCP - Hal Stoneking  Cardiologist - Fransico Him, MD Cardiac clearance from Kathyrn Drown, NP dated 12-16-18 in Epic   Chest x-ray - 11-13-18  EKG - 11-13-18 Stress Test -  ECHO - 03-11-18  Cardiac Cath -   Sleep Study -  CPAP -   Fasting Blood Sugar -  Checks Blood Sugar _____ times a day  Blood Thinner Instructions: Eliquis. Per cardiologist's office note dated 12-16-18 , Eliquis has been held for the last 2-3 weeks.  Aspirin Instructions: Last Dose:  Anesthesia review: Hx of CVA, A-fib, TIA, HTN, MI, Parkinson, CKD, and Ataxia  Patient denies shortness of breath, fever, cough and chest pain at PAT appointment   Patient verbalized understanding of instructions that were given to them at the PAT appointment. Patient was also instructed that they will need to review over the PAT instructions again at home before surgery.

## 2018-12-21 NOTE — Progress Notes (Signed)
Spoke with Sevante at [facility] about moving patients arrival time up to 0930 pm 12/22/18.

## 2018-12-21 NOTE — Progress Notes (Signed)
Per voice message from Miranda at [facility]. Fax instructions received, no questions at this time.

## 2018-12-21 NOTE — Progress Notes (Signed)
Preop instructions for:   [name]   Date of Birth     01-07-2032                    Date of Procedure:   12-22-18    Doctor: Nicolette Bang  Time to arrive at [facility]:10:00 AM Report to: Admitting   Procedure:Cystoscopy Right Retrograde Pyelogram, Right Ureteroscopy Laser, Lithotripsy and Right Stent Exchange   Do not eat or drink past midnight the night before your procedure.(To include any tube feedings-must be discontinued)   Take these morning medications only with sips of water.(or give through gastrostomy or feeding tube).Carbidopa-Levodopa, Levocetirizine, Diltiazem, Levothyroxine, Sertraline Zoloft, Metoprolol, and Mirabegron ER. You may also use your eyedrops and nasal spray  Note: No Insulin or Diabetic meds should be given or taken the morning of the procedure!   Facility contact: [facility]       Phone: [phone]     Health Care POA: Kevan Rosebush [phone]                      [name] [phone]  Transportation contact phone#: [facility] [phone]  Please send day of procedure:current med list and meds last taken that day, confirm nothing by mouth status from what time, Patient Demographic info( to include DNR status, problem list, allergies)   RN contact name/phone#:   Kassie Mends and Fax #: [phone]  Bring Insurance card and picture ID Leave all jewelry and other valuables at place where living( no metal or rings to be worn) No contact lens Men-no colognes,lotions  Any questions day of procedure,call  [facility]-[phone]   Sent from :[facility]                   [place]                   Fax:[phone]  Sent by : Janyth Pupa, RN                  [phone]

## 2018-12-22 ENCOUNTER — Ambulatory Visit (HOSPITAL_COMMUNITY): Payer: Medicare Other

## 2018-12-22 ENCOUNTER — Encounter (HOSPITAL_COMMUNITY)
Admission: RE | Disposition: A | Discharge status: Home / Self Care | Payer: Self-pay | Source: Home / Self Care | Attending: Urology

## 2018-12-22 ENCOUNTER — Ambulatory Visit (HOSPITAL_COMMUNITY): Payer: Medicare Other | Admitting: Physician Assistant

## 2018-12-22 ENCOUNTER — Ambulatory Visit (HOSPITAL_COMMUNITY)
Admission: RE | Admit: 2018-12-22 | Discharge: 2018-12-22 | Disposition: A | Discharge status: Home / Self Care | Payer: Medicare Other | Attending: Urology | Admitting: Urology

## 2018-12-22 ENCOUNTER — Encounter (HOSPITAL_COMMUNITY): Payer: Self-pay | Admitting: Urology

## 2018-12-22 DIAGNOSIS — Z20828 Contact with and (suspected) exposure to other viral communicable diseases: Secondary | ICD-10-CM | POA: Diagnosis not present

## 2018-12-22 DIAGNOSIS — G20 Parkinson's disease: Secondary | ICD-10-CM | POA: Diagnosis not present

## 2018-12-22 DIAGNOSIS — E1122 Type 2 diabetes mellitus with diabetic chronic kidney disease: Secondary | ICD-10-CM | POA: Insufficient documentation

## 2018-12-22 DIAGNOSIS — I252 Old myocardial infarction: Secondary | ICD-10-CM | POA: Insufficient documentation

## 2018-12-22 DIAGNOSIS — M1990 Unspecified osteoarthritis, unspecified site: Secondary | ICD-10-CM | POA: Diagnosis not present

## 2018-12-22 DIAGNOSIS — Z79899 Other long term (current) drug therapy: Secondary | ICD-10-CM | POA: Diagnosis not present

## 2018-12-22 DIAGNOSIS — R310 Gross hematuria: Secondary | ICD-10-CM | POA: Insufficient documentation

## 2018-12-22 DIAGNOSIS — K219 Gastro-esophageal reflux disease without esophagitis: Secondary | ICD-10-CM | POA: Diagnosis not present

## 2018-12-22 DIAGNOSIS — Z955 Presence of coronary angioplasty implant and graft: Secondary | ICD-10-CM | POA: Insufficient documentation

## 2018-12-22 DIAGNOSIS — E039 Hypothyroidism, unspecified: Secondary | ICD-10-CM | POA: Insufficient documentation

## 2018-12-22 DIAGNOSIS — E113299 Type 2 diabetes mellitus with mild nonproliferative diabetic retinopathy without macular edema, unspecified eye: Secondary | ICD-10-CM | POA: Insufficient documentation

## 2018-12-22 DIAGNOSIS — Z881 Allergy status to other antibiotic agents status: Secondary | ICD-10-CM | POA: Diagnosis not present

## 2018-12-22 DIAGNOSIS — N183 Chronic kidney disease, stage 3 unspecified: Secondary | ICD-10-CM | POA: Insufficient documentation

## 2018-12-22 DIAGNOSIS — E785 Hyperlipidemia, unspecified: Secondary | ICD-10-CM | POA: Insufficient documentation

## 2018-12-22 DIAGNOSIS — Z8546 Personal history of malignant neoplasm of prostate: Secondary | ICD-10-CM | POA: Diagnosis not present

## 2018-12-22 DIAGNOSIS — Z8673 Personal history of transient ischemic attack (TIA), and cerebral infarction without residual deficits: Secondary | ICD-10-CM | POA: Diagnosis not present

## 2018-12-22 DIAGNOSIS — Z466 Encounter for fitting and adjustment of urinary device: Secondary | ICD-10-CM | POA: Diagnosis not present

## 2018-12-22 DIAGNOSIS — I129 Hypertensive chronic kidney disease with stage 1 through stage 4 chronic kidney disease, or unspecified chronic kidney disease: Secondary | ICD-10-CM | POA: Diagnosis not present

## 2018-12-22 DIAGNOSIS — J45909 Unspecified asthma, uncomplicated: Secondary | ICD-10-CM | POA: Insufficient documentation

## 2018-12-22 DIAGNOSIS — I480 Paroxysmal atrial fibrillation: Secondary | ICD-10-CM | POA: Insufficient documentation

## 2018-12-22 DIAGNOSIS — Z96653 Presence of artificial knee joint, bilateral: Secondary | ICD-10-CM | POA: Diagnosis not present

## 2018-12-22 DIAGNOSIS — N201 Calculus of ureter: Secondary | ICD-10-CM

## 2018-12-22 DIAGNOSIS — I2510 Atherosclerotic heart disease of native coronary artery without angina pectoris: Secondary | ICD-10-CM | POA: Insufficient documentation

## 2018-12-22 HISTORY — PX: HOLMIUM LASER APPLICATION: SHX5852

## 2018-12-22 HISTORY — PX: CYSTOSCOPY WITH RETROGRADE PYELOGRAM, URETEROSCOPY AND STENT PLACEMENT: SHX5789

## 2018-12-22 LAB — RESPIRATORY PANEL BY RT PCR (FLU A&B, COVID)
Influenza A by PCR: NEGATIVE
Influenza B by PCR: NEGATIVE
SARS Coronavirus 2 by RT PCR: NEGATIVE

## 2018-12-22 LAB — CBC WITH DIFFERENTIAL/PLATELET
Abs Immature Granulocytes: 0.02 10*3/uL (ref 0.00–0.07)
Basophils Absolute: 0.1 10*3/uL (ref 0.0–0.1)
Basophils Relative: 1 %
Eosinophils Absolute: 0.4 10*3/uL (ref 0.0–0.5)
Eosinophils Relative: 8 %
HCT: 37.4 % — ABNORMAL LOW (ref 39.0–52.0)
Hemoglobin: 11.1 g/dL — ABNORMAL LOW (ref 13.0–17.0)
Immature Granulocytes: 0 %
Lymphocytes Relative: 26 %
Lymphs Abs: 1.4 10*3/uL (ref 0.7–4.0)
MCH: 25.7 pg — ABNORMAL LOW (ref 26.0–34.0)
MCHC: 29.7 g/dL — ABNORMAL LOW (ref 30.0–36.0)
MCV: 86.6 fL (ref 80.0–100.0)
Monocytes Absolute: 0.6 10*3/uL (ref 0.1–1.0)
Monocytes Relative: 12 %
Neutro Abs: 2.7 10*3/uL (ref 1.7–7.7)
Neutrophils Relative %: 53 %
Platelets: 198 10*3/uL (ref 150–400)
RBC: 4.32 MIL/uL (ref 4.22–5.81)
RDW: 25.1 % — ABNORMAL HIGH (ref 11.5–15.5)
WBC: 5.2 10*3/uL (ref 4.0–10.5)
nRBC: 0 % (ref 0.0–0.2)

## 2018-12-22 LAB — HEMOGLOBIN A1C
Hgb A1c MFr Bld: 5.6 % (ref 4.8–5.6)
Hgb A1c MFr Bld: 5.7 % — ABNORMAL HIGH (ref 4.8–5.6)
Mean Plasma Glucose: 114.02 mg/dL
Mean Plasma Glucose: 116.89 mg/dL

## 2018-12-22 LAB — CBC
HCT: 40.7 % (ref 39.0–52.0)
Hemoglobin: 11.8 g/dL — ABNORMAL LOW (ref 13.0–17.0)
MCH: 25.5 pg — ABNORMAL LOW (ref 26.0–34.0)
MCHC: 29 g/dL — ABNORMAL LOW (ref 30.0–36.0)
MCV: 87.9 fL (ref 80.0–100.0)
Platelets: 201 10*3/uL (ref 150–400)
RBC: 4.63 MIL/uL (ref 4.22–5.81)
WBC: 8.1 10*3/uL (ref 4.0–10.5)
nRBC: 0 % (ref 0.0–0.2)

## 2018-12-22 LAB — BASIC METABOLIC PANEL
Anion gap: 8 (ref 5–15)
Anion gap: 8 (ref 5–15)
BUN: 28 mg/dL — ABNORMAL HIGH (ref 8–23)
BUN: 27 mg/dL — ABNORMAL HIGH (ref 8–23)
CO2: 26 mmol/L (ref 22–32)
CO2: 27 mmol/L (ref 22–32)
Calcium: 8.8 mg/dL — ABNORMAL LOW (ref 8.9–10.3)
Calcium: 8.6 mg/dL — ABNORMAL LOW (ref 8.9–10.3)
Chloride: 105 mmol/L (ref 98–111)
Chloride: 104 mmol/L (ref 98–111)
Creatinine, Ser: 1.34 mg/dL — ABNORMAL HIGH (ref 0.61–1.24)
Creatinine, Ser: 1.23 mg/dL (ref 0.61–1.24)
GFR calc Af Amer: 56 mL/min — ABNORMAL LOW (ref >60)
GFR calc Af Amer: >60 mL/min (ref >60)
GFR calc non Af Amer: 48 mL/min — ABNORMAL LOW (ref >60)
GFR calc non Af Amer: 54 mL/min — ABNORMAL LOW (ref >60)
Glucose, Bld: 92 mg/dL (ref 70–99)
Glucose, Bld: 112 mg/dL — ABNORMAL HIGH (ref 70–99)
Potassium: 4.2 mmol/L (ref 3.5–5.1)
Potassium: 4.5 mmol/L (ref 3.5–5.1)
Sodium: 139 mmol/L (ref 135–145)
Sodium: 139 mmol/L (ref 135–145)

## 2018-12-22 LAB — GLUCOSE, CAPILLARY
Glucose-Capillary: 93 mg/dL (ref 70–99)
Glucose-Capillary: 92 mg/dL (ref 70–99)

## 2018-12-22 SURGERY — CYSTOURETEROSCOPY, WITH RETROGRADE PYELOGRAM AND STENT INSERTION
Anesthesia: General | Laterality: Right

## 2018-12-22 MED ORDER — FENTANYL CITRATE (PF) 100 MCG/2ML IJ SOLN
INTRAMUSCULAR | Status: AC
  Filled 2018-12-22: qty 2

## 2018-12-22 MED ORDER — ONDANSETRON HCL 4 MG/2ML IJ SOLN
INTRAMUSCULAR | Status: DC | PRN
  Administered 2018-12-22: 4 mg via INTRAVENOUS

## 2018-12-22 MED ORDER — ACETAMINOPHEN 500 MG PO TABS
1000.0000 mg | ORAL_TABLET | Freq: Once | ORAL | Status: AC
  Administered 2018-12-22: 1000 mg via ORAL
  Filled 2018-12-22: qty 2

## 2018-12-22 MED ORDER — EPHEDRINE SULFATE 50 MG/ML IJ SOLN
INTRAMUSCULAR | Status: DC | PRN
  Administered 2018-12-22: 10 mg via INTRAVENOUS

## 2018-12-22 MED ORDER — TRAMADOL HCL 50 MG PO TABS: 50.0000 mg | ORAL_TABLET | Freq: Four times a day (QID) | ORAL | 0 refills | Status: AC | PRN

## 2018-12-22 MED ORDER — GLYCOPYRROLATE PF 0.2 MG/ML IJ SOSY
PREFILLED_SYRINGE | INTRAMUSCULAR | Status: AC
  Filled 2018-12-22: qty 1

## 2018-12-22 MED ORDER — LIDOCAINE HCL (CARDIAC) PF 100 MG/5ML IV SOSY
PREFILLED_SYRINGE | INTRAVENOUS | Status: DC | PRN
  Administered 2018-12-22: 80 mg via INTRAVENOUS

## 2018-12-22 MED ORDER — FENTANYL CITRATE (PF) 100 MCG/2ML IJ SOLN
INTRAMUSCULAR | Status: DC | PRN
  Administered 2018-12-22: 25 ug via INTRAVENOUS
  Administered 2018-12-22: 50 ug via INTRAVENOUS
  Administered 2018-12-22: 25 ug via INTRAVENOUS

## 2018-12-22 MED ORDER — FENTANYL CITRATE (PF) 100 MCG/2ML IJ SOLN: 25.0000 ug | INTRAMUSCULAR | Status: DC | PRN

## 2018-12-22 MED ORDER — DEXAMETHASONE SODIUM PHOSPHATE 4 MG/ML IJ SOLN
INTRAMUSCULAR | Status: DC | PRN
  Administered 2018-12-22: 5 mg via INTRAVENOUS

## 2018-12-22 MED ORDER — LIDOCAINE 2% (20 MG/ML) 5 ML SYRINGE
INTRAMUSCULAR | Status: AC
  Filled 2018-12-22: qty 5

## 2018-12-22 MED ORDER — GLYCOPYRROLATE 0.2 MG/ML IJ SOLN
INTRAMUSCULAR | Status: DC | PRN
  Administered 2018-12-22: .1 mg via INTRAVENOUS

## 2018-12-22 MED ORDER — PROPOFOL 10 MG/ML IV BOLUS
INTRAVENOUS | Status: AC
  Filled 2018-12-22: qty 20

## 2018-12-22 MED ORDER — EPHEDRINE 5 MG/ML INJ
INTRAVENOUS | Status: AC
  Filled 2018-12-22: qty 30

## 2018-12-22 MED ORDER — PROMETHAZINE HCL 25 MG/ML IJ SOLN: 6.2500 mg | INTRAMUSCULAR | Status: DC | PRN

## 2018-12-22 MED ORDER — SODIUM CHLORIDE 0.9 % IR SOLN
Status: DC | PRN
  Administered 2018-12-22: 3000 mL

## 2018-12-22 MED ORDER — GENTAMICIN SULFATE 40 MG/ML IJ SOLN
5.0000 mg/kg | INTRAVENOUS | Status: AC
  Administered 2018-12-22: 350 mg via INTRAVENOUS
  Filled 2018-12-22: qty 8.75

## 2018-12-22 MED ORDER — PROPOFOL 10 MG/ML IV BOLUS
INTRAVENOUS | Status: DC | PRN
  Administered 2018-12-22: 80 mg via INTRAVENOUS

## 2018-12-22 MED ORDER — 0.9 % SODIUM CHLORIDE (POUR BTL) OPTIME
TOPICAL | Status: DC | PRN
  Administered 2018-12-22: 1000 mL

## 2018-12-22 MED ORDER — LACTATED RINGERS IV SOLN
INTRAVENOUS | Status: DC
  Administered 2018-12-22: 11:00:00 via INTRAVENOUS

## 2018-12-22 MED ORDER — IOHEXOL 300 MG/ML  SOLN
INTRAMUSCULAR | Status: DC | PRN
  Administered 2018-12-22: 20 mL via URETHRAL

## 2018-12-22 SURGICAL SUPPLY — 25 items
BAG URO CATCHER STRL LF (MISCELLANEOUS) ×2 IMPLANT
CATH INTERMIT  6FR 70CM (CATHETERS) ×2 IMPLANT
CLOTH BEACON ORANGE TIMEOUT ST (SAFETY) ×2 IMPLANT
COVER SURGICAL LIGHT HANDLE (MISCELLANEOUS) ×2 IMPLANT
COVER WAND RF STERILE (DRAPES) IMPLANT
EXTRACTOR STONE NITINOL NGAGE (UROLOGICAL SUPPLIES) ×1 IMPLANT
FIBER LASER FLEXIVA 1000 (UROLOGICAL SUPPLIES) IMPLANT
FIBER LASER FLEXIVA 365 (UROLOGICAL SUPPLIES) ×1 IMPLANT
FIBER LASER FLEXIVA 550 (UROLOGICAL SUPPLIES) IMPLANT
FIBER LASER TRAC TIP (UROLOGICAL SUPPLIES) ×1 IMPLANT
GLOVE BIO SURGEON STRL SZ8 (GLOVE) ×2 IMPLANT
GOWN STRL REUS W/TWL XL LVL3 (GOWN DISPOSABLE) ×2 IMPLANT
GUIDEWIRE ANG ZIPWIRE 038X150 (WIRE) ×2 IMPLANT
GUIDEWIRE STR DUAL SENSOR (WIRE) ×1 IMPLANT
IV NS 1000ML (IV SOLUTION) ×2
IV NS 1000ML BAXH (IV SOLUTION) ×1 IMPLANT
KIT TURNOVER KIT A (KITS) IMPLANT
MANIFOLD NEPTUNE II (INSTRUMENTS) ×2 IMPLANT
PACK CYSTO (CUSTOM PROCEDURE TRAY) ×2 IMPLANT
PENCIL SMOKE EVACUATOR (MISCELLANEOUS) IMPLANT
SHEATH URETERAL 12FRX35CM (MISCELLANEOUS) IMPLANT
STENT URET 6FRX26 CONTOUR (STENTS) IMPLANT
TUBE FEEDING 8FR 16IN STR KANG (MISCELLANEOUS) IMPLANT
TUBING CONNECTING 10 (TUBING) ×2 IMPLANT
TUBING UROLOGY SET (TUBING) ×1 IMPLANT

## 2018-12-22 NOTE — Discharge Instructions (Signed)

## 2018-12-22 NOTE — Transfer of Care (Signed)
Immediate Anesthesia Transfer of Care Note  Patient: [name]  Procedure(s) Performed: Procedure(s) with comments: CYSTOSCOPY WITH RETROGRADE PYELOGRAM, URETEROSCOPY AND STENT PLACEMENT (Right) - 66 MINS HOLMIUM LASER APPLICATION (Right)  Patient Location: PACU  Anesthesia Type:General  Level of Consciousness:  sedated, patient cooperative and responds to stimulation  Airway & Oxygen Therapy:Patient Spontanous Breathing and Patient connected to face mask oxgen  Post-op Assessment:  Report given to PACU RN and Post -op Vital signs reviewed and stable  Post vital signs:  Reviewed and stable  Last Vitals:  Vitals:   12/22/18 1001 12/22/18 1324  BP: (!) 155/92   Pulse: 72 (!) (P) 55  Resp: 16 (P) 10  Temp: 36.8 C (!) (P) 36.4 C  SpO2: 100% (P) [id number]    Complications: No apparent anesthesia complications

## 2018-12-22 NOTE — Op Note (Signed)
Preoperative diagnosis: Right ureteral stone, retained right ureteral stent  Postoperative diagnosis: Same  Procedure: 1 cystoscopy 2 right retrograde pyelography 3.  Intraoperative fluoroscopy, under one hour, with interpretation 4.  Right ureteroscopic stone manipulation with laser lithotripsy 5.  Right 6 x 26 JJ stent exchange  Attending: Rosie Fate  Anesthesia: General  Estimated blood loss: None  Drains: Right 6 x 26 JJ ureteral stent without tether  Specimens: stone for analysis  Antibiotics: gentamicin  Findings: Right ureteral stent heavily encrusted distally requiring laser. 3 distal ureteral calculi. No hydronephrosis .  Indications: Patient is a 85 year old male/male with a history of ureteral stone and a retained right ureteral stent.  After discussing treatment options, he decided proceed with right ureteroscopic stone manipulation.  Procedure her in detail: The patient was brought to the operating room and a brief timeout was done to ensure correct patient, correct procedure, correct site.  General anesthesia was administered patient was placed in dorsal lithotomy position.  Her genitalia was then prepped and draped in usual sterile fashion.  A rigid 17 French cystoscope was passed in the urethra and the bladder.  Bladder was inspected free masses or lesions.  the  ureteral orifices were in the normal orthotopic locations. Using a 365nm laser fiber the distal curl of the right ureteral stent was freed and then using a grasper the stent was removed. a 6 french ureteral catheter was then instilled into the right ureter orifice.  a gentle retrograde was obtained and findings noted above.  we then placed a zip wire through the ureteral catheter and advanced up to the renal pelvis.  we then removed the cystoscope and cannulated the right ureteral orifice with a semirigid ureteroscope.  we then encountered the stones in the mid ureter.  using a 365 nm laser fiber and  fragmented the stone into smaller pieces.  the pieces were then removed with a engage basket. once all stone fragments were removed we then placed a 6 x 26 double-j ureteral stent over the original zip wire. We then removed the wire and good coil was noted in the the renal pelvis under fluoroscopy and the bladder under direct vision.     the stone fragments were then removed from the bladder and sent for analysis.   the bladder was then drained and this concluded the procedure which was well tolerated by patient.  Complications: None  Condition: Stable, extubated, transferred to PACU  Plan: Patient is to be discharged home as to follow-up in 2 weeks for stent removal.

## 2018-12-22 NOTE — Anesthesia Procedure Notes (Signed)
Procedure Name: LMA Insertion Date/Time: 12/22/2018 12:28 PM Performed by: Lavina Hamman, CRNA Pre-anesthesia Checklist: Patient identified, Emergency Drugs available, Suction available and Patient being monitored Patient Re-evaluated:Patient Re-evaluated prior to induction Oxygen Delivery Method: Circle System Utilized Preoxygenation: Pre-oxygenation with 100% oxygen Induction Type: IV induction Ventilation: Mask ventilation without difficulty LMA: LMA inserted LMA Size: 4.0 Number of attempts: 1 Airway Equipment and Method: Bite block Placement Confirmation: positive ETCO2 Tube secured with: Tape Dental Injury: Teeth and Oropharynx as per pre-operative assessment

## 2018-12-22 NOTE — H&P (Signed)
Urology Admission H&P  Chief Complaint: right flank pain  History of Present Illness: [name] is a 85yo with a hx of right ureteral calculi who had a stent placed over 1 year ago and he cancelled his stone extraction and still has an indwelling stent. He is having intermittent gross hematuria. He has intermittent right flank pain  Past Medical History:  Diagnosis Date  . Allergic rhinitis   . Arthritis    arthritis-kyphosis  . Asthmatic bronchitis   . CKD (chronic kidney disease) stage 3, GFR 30-59 ml/min   . DM type 2 (diabetes mellitus, type 2) ([place])   . DVT, lower extremity, proximal, acute ([place])   . Dyslipidemia   . GERD (gastroesophageal reflux disease)   . Hearing impaired    bilateral hearing aids  . History of kidney stones   . History of skin cancer   . Hypertension   . Hypothyroidism   . Macular degeneration    injections done bilaterally recent - 08-22-13  . PAF (paroxysmal atrial fibrillation) ([place])   . Prostate cancer [facility])    Prostate cancer-radiation only,skin cancer-basal cell and last squamous cell right eye  . Stroke ([place])   . Urgency-frequency syndrome   . Urticaria    Past Surgical History:  Procedure Laterality Date  . BACK SURGERY     lumbar fracture  . cataract surgery Bilateral   . COLONOSCOPY W/ POLYPECTOMY     x2   . CORONARY STENT INTERVENTION N/A 08/06/2017   Procedure: CORONARY STENT INTERVENTION;  Surgeon: Jettie Booze, MD;  Location: [facility];  Service: Cardiovascular;  Laterality: N/A;  . CYSTOSCOPY W/ URETERAL STENT PLACEMENT Right 08/13/2017   Procedure: CYSTOSCOPY WITH RIGHT  RETROGRADE PYELOGRAM/URETERAL STENT PLACEMENT;  Surgeon: Raynelle Bring, MD;  Location: [facility];  Service: Urology;  Laterality: Right;  . CYSTOSCOPY WITH INJECTION N/A 09/11/2013   Procedure: CYSTOSCOPY WITH BOTOX INJECTION INTO THE BLADDER;  Surgeon: Ailene Rud, MD;  Location: [facility];  Service: Urology;  Laterality: N/A;  . CYSTOSCOPY WITH  RETROGRADE PYELOGRAM, URETEROSCOPY AND STENT PLACEMENT    . CYSTOSCOPY WITH STENT PLACEMENT Right 01/26/2016   Procedure: CYSTOSCOPY, RIGHT RETROGRADE WITH RIGHT URETERAL STENT PLACEMENT;  Surgeon: Cleon Gustin, MD;  Location: [facility];  Service: Urology;  Laterality: Right;  . CYSTOSCOPY/RETROGRADE/URETEROSCOPY Right 04/22/2016   Procedure: CYSTOSCOPY/RETROGRADE/ REMOVAL JJ STENT right insertion stent right insertion of foley;  Surgeon: Franchot Gallo, MD;  Location: [facility];  Service: Urology;  Laterality: Right;  . EXTRACORPOREAL SHOCK WAVE LITHOTRIPSY Right 02/27/2016   Procedure: RIGHT EXTRACORPOREAL SHOCK WAVE LITHOTRIPSY (ESWL) GAITED;  Surgeon: Cleon Gustin, MD;  Location: [facility];  Service: Urology;  Laterality: Right;  . EXTRACORPOREAL SHOCK WAVE LITHOTRIPSY Right 02/10/2016   Procedure: EXTRACORPOREAL SHOCK WAVE LITHOTRIPSY (ESWL);  Surgeon: Kathie Rhodes, MD;  Location: [facility];  Service: Urology;  Laterality: Right;  [facility]-[phone] [id number] A [employer]- [id number]   . EYE SURGERY     macular degeneration  . GALLBLADDER SURGERY  2006   no cholecystectomy  . HERNIA REPAIR    . LEFT HEART CATH AND CORONARY ANGIOGRAPHY N/A 08/06/2017   Procedure: LEFT HEART CATH AND CORONARY ANGIOGRAPHY;  Surgeon: Jettie Booze, MD;  Location: [facility];  Service: Cardiovascular;  Laterality: N/A;  . LUNG REMOVAL, PARTIAL  age 68   for bronchiectasis  . PARTIAL THYMECTOMY  1985   for nodules  . POPLITEAL SYNOVIAL CYST EXCISION  2005  . TOTAL KNEE ARTHROPLASTY  2008   right  . TOTAL KNEE ARTHROPLASTY  2010   left    Home Medications:  Current Facility-Administered Medications  Medication Dose Route Frequency Provider Last Rate Last Admin  . gentamicin (GARAMYCIN) 350 mg in dextrose 5 % 100 mL IVPB  5 mg/kg Intravenous NOW McKenzie, Candee Furbish, MD      . lactated ringers infusion   Intravenous Continuous Cleon Gustin, MD 10 mL/hr at 12/22/18  1128 Continued from Pre-op at 12/22/18 1128   Allergies:  Allergies  Allergen Reactions  . Colchicine Anaphylaxis  . Hibiclens [Chlorhexidine] Hives  . Atorvastatin Other (See Comments)    Joint pain   . Ceftin Diarrhea  . Celebrex [Celecoxib] Swelling  . Codeine Hives  . Erythromycin Diarrhea  . Ezetimibe Other (See Comments)    Joint pain   . Iodinated Diagnostic Agents Nausea And Vomiting and Rash  . Oxycodone-Acetaminophen Hives  . Rosuvastatin Other (See Comments)    Joint pain   . Simvastatin Other (See Comments)    Joint pain   . Cefpodoxime Rash  . Cefuroxime Axetil Rash  . Nitroglycerin Other (See Comments)    Lowers patient's B/P  . Tetanus Toxoid Rash  . Vantin Other (See Comments)    Unknown/not noted on MAR??    Family History  Problem Relation Age of Onset  . Other Sister        ophth disease  . Heart attack Father        x7  . Heart disease Father        MI x7  . Hypertension Mother   . Stroke Mother 79  . Amblyopia Neg Hx   . Blindness Neg Hx   . Glaucoma Neg Hx   . Macular degeneration Neg Hx   . Retinal detachment Neg Hx   . Strabismus Neg Hx   . Retinitis pigmentosa Neg Hx   . Cataracts Neg Hx    Social History:  reports that he has never smoked. He has never used smokeless tobacco. He reports that he does not drink alcohol or use drugs.  Review of Systems  All other systems reviewed and are negative.   Physical Exam:  Vital signs in last 24 hours: Temp:  [98.3 F (36.8 C)] 98.3 F (36.8 C) (12/10 1001) Pulse Rate:  [72] 72 (12/10 1001) Resp:  [16] 16 (12/10 1001) BP: (155)/(92) 155/92 (12/10 1001) SpO2:  [100 %] 100 % (12/10 1001) Weight:  [70.3 kg] 70.3 kg (12/10 1001) Physical Exam  Constitutional: He is oriented to person, place, and time. He appears well-developed and well-nourished.  HENT:  Head: Normocephalic and atraumatic.  Eyes: Pupils are equal, round, and reactive to light. EOM are normal.  Neck: No thyromegaly  present.  Cardiovascular: Normal rate and regular rhythm.  Respiratory: Effort normal. No respiratory distress.  GI: Soft. He exhibits no distension.  Musculoskeletal:        General: No edema. Normal range of motion.     Cervical back: Normal range of motion.  Neurological: He is alert and oriented to person, place, and time.  Skin: Skin is warm and dry.  Psychiatric: He has a normal mood and affect. His behavior is normal. Judgment and thought content normal.    Laboratory Data:  Results for orders placed or performed during the hospital encounter of 12/22/18 (from the past 24 hour(s))  Respiratory Panel by RT PCR (Flu A&B, Covid) -     Status: None   Collection Time: 12/22/18  9:40 AM  Result Value Ref Range   SARS Coronavirus 2 by RT PCR NEGATIVE NEGATIVE   Influenza A by PCR NEGATIVE NEGATIVE   Influenza B by PCR NEGATIVE NEGATIVE  Glucose, capillary     Status: None   Collection Time: 12/22/18  9:44 AM  Result Value Ref Range   Glucose-Capillary 93 70 - 99 mg/dL  CBC with Differential/Platelet     Status: Abnormal (Preliminary result)   Collection Time: 12/22/18 10:45 AM  Result Value Ref Range   WBC 5.2 4.0 - 10.5 K/uL   RBC 4.32 4.22 - 5.81 MIL/uL   Hemoglobin 11.1 (L) 13.0 - 17.0 g/dL   HCT 37.4 (L) 39.0 - 52.0 %   MCV 86.6 80.0 - 100.0 fL   MCH 25.7 (L) 26.0 - 34.0 pg   MCHC 29.7 (L) 30.0 - 36.0 g/dL   RDW 25.1 (H) 11.5 - 15.5 %   Platelets 198 150 - 400 K/uL   nRBC 0.0 0.0 - 0.2 %   Neutrophils Relative % PENDING %   Neutro Abs PENDING 1.7 - 7.7 K/uL   Band Neutrophils PENDING %   Lymphocytes Relative PENDING %   Lymphs Abs PENDING 0.7 - 4.0 K/uL   Monocytes Relative PENDING %   Monocytes Absolute PENDING 0.1 - 1.0 K/uL   Eosinophils Relative PENDING %   Eosinophils Absolute PENDING 0.0 - 0.5 K/uL   Basophils Relative PENDING %   Basophils Absolute PENDING 0.0 - 0.1 K/uL   WBC Morphology PENDING    RBC Morphology PENDING    Smear Review PENDING    Other  PENDING %   nRBC PENDING 0 /100 WBC   Metamyelocytes Relative PENDING %   Myelocytes PENDING %   Promyelocytes Relative PENDING %   Blasts PENDING %   Immature Granulocytes PENDING %   Abs Immature Granulocytes PENDING 0.00 - 0.07 K/uL   Recent Results (from the past 240 hour(s))  Respiratory Panel by RT PCR (Flu A&B, Covid) -     Status: None   Collection Time: 12/22/18  9:40 AM  Result Value Ref Range Status   SARS Coronavirus 2 by RT PCR NEGATIVE NEGATIVE Final    Comment: (NOTE) SARS-CoV-2 target nucleic acids are NOT DETECTED. The SARS-CoV-2 RNA is generally detectable in upper respiratoy specimens during the acute phase of infection. The lowest concentration of SARS-CoV-2 viral copies this assay can detect is 131 copies/mL. A negative result does not preclude SARS-Cov-2 infection and should not be used as the sole basis for treatment or other patient management decisions. A negative result may occur with  improper specimen collection/handling, submission of specimen other than nasopharyngeal swab, presence of viral mutation(s) within the areas targeted by this assay, and inadequate number of viral copies (<131 copies/mL). A negative result must be combined with clinical observations, patient history, and epidemiological information. The expected result is Negative. Fact Sheet for Patients:  PinkCheek.be Fact Sheet for Healthcare Providers:  GravelBags.it This test is not yet ap proved or cleared by the [place] FDA and  has been authorized for detection and/or diagnosis of SARS-CoV-2 by FDA under an Emergency Use Authorization (EUA). This EUA will remain  in effect (meaning this test can be used) for the duration of the COVID-19 declaration under Section 564(b)(1) of the Act, 21 U.S.C. section 360bbb-3(b)(1), unless the authorization is terminated or revoked sooner.    Influenza A by PCR NEGATIVE NEGATIVE  Final   Influenza B by PCR NEGATIVE NEGATIVE Final  Comment: (NOTE) The Xpert Xpress SARS-CoV-2/FLU/RSV assay is intended as an aid in  the diagnosis of influenza from Nasopharyngeal swab specimens and  should not be used as a sole basis for treatment. Nasal washings and  aspirates are unacceptable for Xpert Xpress SARS-CoV-2/FLU/RSV  testing. Fact Sheet for Patients: PinkCheek.be Fact Sheet for Healthcare Providers: GravelBags.it This test is not yet approved or cleared by the [place] FDA and  has been authorized for detection and/or diagnosis of SARS-CoV-2 by  FDA under an Emergency Use Authorization (EUA). This EUA will remain  in effect (meaning this test can be used) for the duration of the  Covid-19 declaration under Section 564(b)(1) of the Act, 21  U.S.C. section 360bbb-3(b)(1), unless the authorization is  terminated or revoked. Performed at [facility], [place] [address] [place], [place] [address]    Creatinine: No results for input(s): CREATININE in the last 168 hours. Baseline Creatinine: unknown  Impression/Assessment:  86yo with retained right ureteral stent and right calculi  Plan:  The risks/benefits/alternatives to right ureteroscopic stone extraction was explained to the patient and he understands and wishes to proceed with surgery  Nicolette Bang 12/22/2018, 12:02 PM

## 2018-12-22 NOTE — Anesthesia Postprocedure Evaluation (Signed)
Anesthesia Post Note  Patient: [name]  Procedure(s) Performed: CYSTOSCOPY WITH RETROGRADE PYELOGRAM, URETEROSCOPY AND STENT PLACEMENT (Right ) HOLMIUM LASER APPLICATION (Right )     Patient location during evaluation: PACU Anesthesia Type: General Level of consciousness: awake and alert Pain management: pain level controlled Vital Signs Assessment: post-procedure vital signs reviewed and stable Respiratory status: spontaneous breathing, nonlabored ventilation, respiratory function stable and patient connected to nasal cannula oxygen Cardiovascular status: blood pressure returned to baseline and stable Postop Assessment: no apparent nausea or vomiting Anesthetic complications: no    Last Vitals:  Vitals:   12/22/18 1400 12/22/18 1438  BP: (!) 165/94 (!) 161/94  Pulse: 63 67  Resp: 10 18  Temp: (!) 36.3 C (!) 36.4 C  SpO2: 97% 96%    Last Pain:  Vitals:   12/22/18 1438  TempSrc: Oral  PainSc: 0-No pain                 Aleeta Schmaltz COKER

## 2018-12-22 NOTE — Anesthesia Preprocedure Evaluation (Addendum)
Anesthesia Evaluation  Patient identified by MRN, date of birth, ID band Patient awake    Reviewed: Allergy & Precautions, NPO status , Patient's Chart, lab work & pertinent test results, reviewed documented beta blocker date and time   History of Anesthesia Complications Negative for: history of anesthetic complications  Airway Mallampati: II  TM Distance: >3 FB Neck ROM: Full    Dental no notable dental hx.    Pulmonary neg pulmonary ROS,    Pulmonary exam normal        Cardiovascular hypertension, Pt. on home beta blockers and Pt. on medications + CAD, + Past MI, + Cardiac Stents (2019) and + DVT  Normal cardiovascular exam+ dysrhythmias Atrial Fibrillation   TTE 2/20: EF [id number], diastolic dysfunction   Neuro/Psych TIACVA (left facial droop), Residual Symptoms negative psych ROS   GI/Hepatic Neg liver ROS, GERD  Controlled,  Endo/Other  diabetes, Type 2, Oral Hypoglycemic AgentsHypothyroidism   Renal/GU Renal InsufficiencyRenal disease  negative genitourinary   Musculoskeletal  (+) Arthritis ,   Abdominal   Peds  Hematology negative hematology ROS (+)   Anesthesia Other Findings Day of surgery medications reviewed with patient.  Reproductive/Obstetrics negative OB ROS                            Anesthesia Physical Anesthesia Plan  ASA: III  Anesthesia Plan: General   Post-op Pain Management:    Induction: Intravenous  PONV Risk Score and Plan: 2 and Treatment may vary due to age or medical condition and Ondansetron  Airway Management Planned: LMA  Additional Equipment: None  Intra-op Plan:   Post-operative Plan: Extubation in OR  Informed Consent: I have reviewed the patients History and Physical, chart, labs and discussed the procedure including the risks, benefits and alternatives for the proposed anesthesia with the patient or authorized representative who has indicated  his/her understanding and acceptance.     Dental advisory given  Plan Discussed with: CRNA  Anesthesia Plan Comments:        Anesthesia Quick Evaluation

## 2018-12-30 ENCOUNTER — Telehealth: Payer: Self-pay

## 2018-12-30 ENCOUNTER — Other Ambulatory Visit: Payer: Self-pay

## 2018-12-30 ENCOUNTER — Emergency Department (HOSPITAL_COMMUNITY): Payer: Medicare Other

## 2018-12-30 ENCOUNTER — Emergency Department (HOSPITAL_COMMUNITY)
Admission: EM | Admit: 2018-12-30 | Discharge: 2018-12-31 | Disposition: A | Discharge status: Home / Self Care | Payer: Medicare Other | Attending: Emergency Medicine | Admitting: Emergency Medicine

## 2018-12-30 DIAGNOSIS — I129 Hypertensive chronic kidney disease with stage 1 through stage 4 chronic kidney disease, or unspecified chronic kidney disease: Secondary | ICD-10-CM | POA: Insufficient documentation

## 2018-12-30 DIAGNOSIS — W19XXXA Unspecified fall, initial encounter: Secondary | ICD-10-CM

## 2018-12-30 DIAGNOSIS — Y939 Activity, unspecified: Secondary | ICD-10-CM | POA: Insufficient documentation

## 2018-12-30 DIAGNOSIS — Z79899 Other long term (current) drug therapy: Secondary | ICD-10-CM | POA: Diagnosis not present

## 2018-12-30 DIAGNOSIS — Z7902 Long term (current) use of antithrombotics/antiplatelets: Secondary | ICD-10-CM | POA: Insufficient documentation

## 2018-12-30 DIAGNOSIS — Y999 Unspecified external cause status: Secondary | ICD-10-CM | POA: Diagnosis not present

## 2018-12-30 DIAGNOSIS — E1122 Type 2 diabetes mellitus with diabetic chronic kidney disease: Secondary | ICD-10-CM | POA: Diagnosis not present

## 2018-12-30 DIAGNOSIS — Z7901 Long term (current) use of anticoagulants: Secondary | ICD-10-CM | POA: Diagnosis not present

## 2018-12-30 DIAGNOSIS — N183 Chronic kidney disease, stage 3 unspecified: Secondary | ICD-10-CM | POA: Insufficient documentation

## 2018-12-30 DIAGNOSIS — I252 Old myocardial infarction: Secondary | ICD-10-CM | POA: Diagnosis not present

## 2018-12-30 DIAGNOSIS — W010XXA Fall on same level from slipping, tripping and stumbling without subsequent striking against object, initial encounter: Secondary | ICD-10-CM | POA: Insufficient documentation

## 2018-12-30 DIAGNOSIS — Z96653 Presence of artificial knee joint, bilateral: Secondary | ICD-10-CM | POA: Insufficient documentation

## 2018-12-30 DIAGNOSIS — I2510 Atherosclerotic heart disease of native coronary artery without angina pectoris: Secondary | ICD-10-CM | POA: Insufficient documentation

## 2018-12-30 DIAGNOSIS — E890 Postprocedural hypothyroidism: Secondary | ICD-10-CM | POA: Diagnosis not present

## 2018-12-30 DIAGNOSIS — Y92121 Bathroom in nursing home as the place of occurrence of the external cause: Secondary | ICD-10-CM | POA: Insufficient documentation

## 2018-12-30 DIAGNOSIS — Z8673 Personal history of transient ischemic attack (TIA), and cerebral infarction without residual deficits: Secondary | ICD-10-CM | POA: Diagnosis not present

## 2018-12-30 DIAGNOSIS — S01112A Laceration without foreign body of left eyelid and periocular area, initial encounter: Secondary | ICD-10-CM | POA: Diagnosis not present

## 2018-12-30 DIAGNOSIS — S0990XA Unspecified injury of head, initial encounter: Secondary | ICD-10-CM | POA: Diagnosis present

## 2018-12-30 DIAGNOSIS — N289 Disorder of kidney and ureter, unspecified: Secondary | ICD-10-CM

## 2018-12-30 DIAGNOSIS — S0181XA Laceration without foreign body of other part of head, initial encounter: Secondary | ICD-10-CM

## 2018-12-30 LAB — BASIC METABOLIC PANEL
Anion gap: 11 (ref 5–15)
BUN: 32 mg/dL — ABNORMAL HIGH (ref 8–23)
CO2: 23 mmol/L (ref 22–32)
Calcium: 8.2 mg/dL — ABNORMAL LOW (ref 8.9–10.3)
Chloride: 104 mmol/L (ref 98–111)
Creatinine, Ser: 1.7 mg/dL — ABNORMAL HIGH (ref 0.61–1.24)
GFR calc Af Amer: 42 mL/min — ABNORMAL LOW (ref >60)
GFR calc non Af Amer: 36 mL/min — ABNORMAL LOW (ref >60)
Glucose, Bld: 197 mg/dL — ABNORMAL HIGH (ref 70–99)
Potassium: 3.9 mmol/L (ref 3.5–5.1)
Sodium: 138 mmol/L (ref 135–145)

## 2018-12-30 LAB — CBC WITH DIFFERENTIAL/PLATELET
Abs Immature Granulocytes: 0 10*3/uL (ref 0.00–0.07)
Basophils Absolute: 0 10*3/uL (ref 0.0–0.1)
Basophils Relative: 0 %
Eosinophils Absolute: 0.8 10*3/uL — ABNORMAL HIGH (ref 0.0–0.5)
Eosinophils Relative: 13 %
HCT: 35.5 % — ABNORMAL LOW (ref 39.0–52.0)
Hemoglobin: 11 g/dL — ABNORMAL LOW (ref 13.0–17.0)
Lymphocytes Relative: 19 %
Lymphs Abs: 1.1 10*3/uL (ref 0.7–4.0)
MCH: 26.6 pg (ref 26.0–34.0)
MCHC: 31 g/dL (ref 30.0–36.0)
MCV: 85.7 fL (ref 80.0–100.0)
Monocytes Absolute: 0.2 10*3/uL (ref 0.1–1.0)
Monocytes Relative: 4 %
Neutro Abs: 3.8 10*3/uL (ref 1.7–7.7)
Neutrophils Relative %: 64 %
Platelets: 201 10*3/uL (ref 150–400)
RBC: 4.14 MIL/uL — ABNORMAL LOW (ref 4.22–5.81)
WBC: 5.9 10*3/uL (ref 4.0–10.5)
nRBC: 0 % (ref 0.0–0.2)
nRBC: 0 /100 WBC

## 2018-12-30 MED ORDER — LIDOCAINE-EPINEPHRINE (PF) 2 %-1:200000 IJ SOLN
20.0000 mL | Freq: Once | INTRAMUSCULAR | Status: AC
  Administered 2018-12-30: 20 mL
  Filled 2018-12-30: qty 20

## 2018-12-30 MED ORDER — LIDOCAINE-EPINEPHRINE 2 %-1:100000 IJ SOLN
20.0000 mL | Freq: Once | INTRAMUSCULAR | Status: DC
  Filled 2018-12-30: qty 20

## 2018-12-30 MED ORDER — SODIUM CHLORIDE 0.9 % IV BOLUS
500.0000 mL | Freq: Once | INTRAVENOUS | Status: AC
  Administered 2018-12-30: 500 mL via INTRAVENOUS

## 2018-12-30 NOTE — Telephone Encounter (Signed)
Hospital liaison team and Palliative Np updated that patient was sent to ED s/p fall.

## 2018-12-30 NOTE — ED Provider Notes (Signed)
[facility] EMERGENCY DEPARTMENT Provider Note   CSN: [id number] Arrival date & time: 12/30/18  1355     History Chief Complaint  Patient presents with  . Fall    NICHOLAAS HEICHEL is a 85 y.o. male.  HPI   Patient presents by EMS from home, a nursing care facility where he slipped and fell in the bathroom.  He presents with injuries to his face, described as a large laceration over left eyebrow by EMS.  Patient cannot give any history.  Level 5 caveat-altered mental status  Past Medical History:  Diagnosis Date  . Allergic rhinitis   . Arthritis    arthritis-kyphosis  . Asthmatic bronchitis   . CKD (chronic kidney disease) stage 3, GFR 30-59 ml/min   . DM type 2 (diabetes mellitus, type 2) ([place])   . DVT, lower extremity, proximal, acute ([place])   . Dyslipidemia   . GERD (gastroesophageal reflux disease)   . Hearing impaired    bilateral hearing aids  . History of kidney stones   . History of skin cancer   . Hypertension   . Hypothyroidism   . Macular degeneration    injections done bilaterally recent - 08-22-13  . PAF (paroxysmal atrial fibrillation) ([place])   . Prostate cancer [facility])    Prostate cancer-radiation only,skin cancer-basal cell and last squamous cell right eye  . Stroke ([place])   . Urgency-frequency syndrome   . Urticaria     Patient Active Problem List   Diagnosis Date Noted  . Symptomatic anemia 11/13/2018  . Parkinson's disease ([place]) 11/13/2018  . AF (paroxysmal atrial fibrillation) ([place])   . Coronary artery disease involving native coronary artery of native heart without angina pectoris   . Hyperlipidemia LDL goal <70   . Urinary tract infection with hematuria   . Non-ST elevation (NSTEMI) myocardial infarction ([place])   . GERD (gastroesophageal reflux disease) 08/04/2017  . Elevated troponin 08/03/2017  . CKD (chronic kidney disease), stage III 08/03/2017  . Type II diabetes mellitus with renal manifestations ([place]) 08/03/2017  .  Pseudophakia of both eyes 05/31/2017  . Retained ureteral stent 04/22/2016  . Ureteral calculus 01/25/2016  . Benign essential HTN 10/24/2015  . CVA (cerebral vascular accident) ([place]) 10/14/2014  . TIA (transient ischemic attack)   . CME (cystoid macular edema), left 08/10/2013  . Squamous cell carcinoma of eyelid 05/15/2013  . Blepharitis 03/16/2013  . Posterior vitreous detachment of right eye 10/23/2011  . Exudative age-related macular degeneration ([place]) 07/28/2011  . Nonproliferative diabetic retinopathy ([place]) 04/07/2011  . Insomnia, unspecified 05/19/2010  . ATAXIA 03/17/2010  . DIZZINESS 03/06/2010  . Chronic constipation 02/21/2008  . OSTEOARTHRITIS, KNEE, SEVERE 01/12/2008  . Hypothyroidism (post surgical) 10/15/2006  . Diabetes mellitus, insulin dependent (IDDM), controlled 04/22/2006  . URTICARIA 04/22/2006  . POPLITEAL CYST 04/22/2006  . THYROIDECTOMY, SUBTOTAL, HX OF 04/22/2006  . TOTAL KNEE REPLACEMENT, HX OF 04/22/2006    Past Surgical History:  Procedure Laterality Date  . BACK SURGERY     lumbar fracture  . cataract surgery Bilateral   . COLONOSCOPY W/ POLYPECTOMY     x2   . CORONARY STENT INTERVENTION N/A 08/06/2017   Procedure: CORONARY STENT INTERVENTION;  Surgeon: Jettie Booze, MD;  Location: [facility];  Service: Cardiovascular;  Laterality: N/A;  . CYSTOSCOPY W/ URETERAL STENT PLACEMENT Right 08/13/2017   Procedure: CYSTOSCOPY WITH RIGHT  RETROGRADE PYELOGRAM/URETERAL STENT PLACEMENT;  Surgeon: Raynelle Bring, MD;  Location: [facility];  Service: Urology;  Laterality: Right;  . CYSTOSCOPY WITH INJECTION N/A 09/11/2013   Procedure: CYSTOSCOPY WITH BOTOX INJECTION INTO THE BLADDER;  Surgeon: Ailene Rud, MD;  Location: [facility];  Service: Urology;  Laterality: N/A;  . CYSTOSCOPY WITH RETROGRADE PYELOGRAM, URETEROSCOPY AND STENT PLACEMENT    . CYSTOSCOPY WITH RETROGRADE PYELOGRAM, URETEROSCOPY AND STENT PLACEMENT Right 12/22/2018   Procedure:  CYSTOSCOPY WITH RETROGRADE PYELOGRAM, URETEROSCOPY AND STENT PLACEMENT;  Surgeon: Cleon Gustin, MD;  Location[facility];  Service: Urology;  Laterality: Right;  90 MINS  . CYSTOSCOPY WITH STENT PLACEMENT Right 01/26/2016   Procedure: CYSTOSCOPY, RIGHT RETROGRADE WITH RIGHT URETERAL STENT PLACEMENT;  Surgeon: Cleon Gustin, MD;  [facility];  Service: Urology;  Laterality: Right;  . CYSTOSCOPY/RETROGRADE/URETEROSCOPY Right 04/22/2016   Procedure: CYSTOSCOPY/RETROGRADE/ REMOVAL JJ STENT right insertion stent right insertion of foley;  Surgeon: Franchot Gallo, MD;  Location: [facility];  Service: Urology;  Laterality: Right;  . EXTRACORPOREAL SHOCK WAVE LITHOTRIPSY Right 02/27/2016   Procedure: RIGHT EXTRACORPOREAL SHOCK WAVE LITHOTRIPSY (ESWL) GAITED;  Surgeon: Cleon Gustin, MD;  Location[facility];  Service: Urology;  Laterality: Right;  . EXTRACORPOREAL SHOCK WAVE LITHOTRIPSY Right 02/10/2016   Procedure: EXTRACORPOREAL SHOCK WAVE LITHOTRIPSY (ESWL);  Surgeon: Kathie Rhodes, MD;  Location: [facility];  Service: Urology;  Laterality: Right;  [facility]-[phone] [id number] A [employer]- [id number]   . EYE SURGERY     macular degeneration  . GALLBLADDER SURGERY  2006   no cholecystectomy  . HERNIA REPAIR    . HOLMIUM LASER APPLICATION Right [id number]   Procedure: HOLMIUM LASER APPLICATION;  Surgeon: Cleon Gustin, MD;  [facility];  Service: Urology;  Laterality: Right;  . LEFT HEART CATH AND CORONARY ANGIOGRAPHY N/A 08/06/2017   Procedure: LEFT HEART CATH AND CORONARY ANGIOGRAPHY;  Surgeon: Jettie Booze, MD;  Location: [facility];  Service: Cardiovascular;  Laterality: N/A;  . LUNG REMOVAL, PARTIAL  age 44   for bronchiectasis  . PARTIAL THYMECTOMY  1985   for nodules  . POPLITEAL SYNOVIAL CYST EXCISION  2005  . TOTAL KNEE ARTHROPLASTY  2008   right  . TOTAL KNEE ARTHROPLASTY  2010   left       Family History  Problem Relation Age  of Onset  . Other Sister        ophth disease  . Heart attack Father        x7  . Heart disease Father        MI x7  . Hypertension Mother   . Stroke Mother 67  . Amblyopia Neg Hx   . Blindness Neg Hx   . Glaucoma Neg Hx   . Macular degeneration Neg Hx   . Retinal detachment Neg Hx   . Strabismus Neg Hx   . Retinitis pigmentosa Neg Hx   . Cataracts Neg Hx     Social History   Tobacco Use  . Smoking status: Never Smoker  . Smokeless tobacco: Never Used  Substance Use Topics  . Alcohol use: No  . Drug use: No    Home Medications Prior to Admission medications   Medication Sig Start Date End Date Taking? Authorizing Provider  alfuzosin (UROXATRAL) 10 MG 24 hr tablet Take 1 tablet (10 mg total) by mouth at bedtime. 02/10/16   Kathie Rhodes, MD  Alogliptin Benzoate 25 MG TABS Take 25 mg by mouth.    [provider]  apixaban (ELIQUIS) 2.5 MG TABS tablet Take 2.5 mg by mouth 2 (two)  times daily.    [provider]  benzonatate (TESSALON) 100 MG capsule Take 100 mg by mouth every 8 (eight) hours as needed for cough.     [provider]  brimonidine-timolol (COMBIGAN) 0.2-0.5 % ophthalmic solution Place 1 drop into both eyes 2 (two) times daily.    [provider]  brinzolamide (AZOPT) 1 % ophthalmic suspension Place 1 drop into the right eye 3 (three) times daily.  09/03/16   [provider]  carbidopa-levodopa (SINEMET IR) 25-100 MG tablet Take 1 tablet by mouth 3 (three) times daily.     [provider]  Carboxymethylcellulose Sodium (ARTIFICIAL TEARS OP) Place 1 drop into both eyes 4 (four) times daily.    [provider]  Cholecalciferol (VITAMIN D-3) 5000 units TABS Take 5,000 Units by mouth daily.    [provider]  clopidogrel (PLAVIX) 75 MG tablet Take 75 mg by mouth daily.     [provider]  colesevelam (WELCHOL) 625 MG tablet Take 1,875 mg by mouth every evening.     [provider]   diltiazem (CARDIZEM CD) 120 MG 24 hr capsule Take 1 capsule (120 mg total) by mouth daily for 30 days. 03/12/18   Elodia Florence., MD  docusate sodium (COLACE) 100 MG capsule Take 100 mg by mouth 2 (two) times daily.     [provider]  ferrous sulfate 325 (65 FE) MG EC tablet Take 1 tablet (325 mg total) by mouth 2 (two) times daily with a meal. 11/14/18 11/14/19  Danford, Suann Larry, MD  fluticasone (FLONASE) 50 MCG/ACT nasal spray Place 2 sprays into both nostrils daily.    [provider]  furosemide (LASIX) 20 MG tablet Take 20 mg by mouth daily.    [provider]  gabapentin (NEURONTIN) 100 MG capsule Take 100 mg by mouth at bedtime.    [provider]  latanoprost (XALATAN) 0.005 % ophthalmic solution Place 1 drop into the right eye at bedtime.  07/28/16   [provider]  levocetirizine (XYZAL) 5 MG tablet Take 2.5 mg by mouth at bedtime.  04/15/18   [provider]  levothyroxine (SYNTHROID) 150 MCG tablet Take 150 mcg by mouth daily.     [provider]  Melatonin 3 MG SUBL Place 3 mg under the tongue at bedtime. 11/11/18   [provider]  Menthol, Topical Analgesic, (ZIMS MAX-FREEZE EX) Apply 1 application topically every 8 (eight) hours as needed (pain). Apply to ankles    [provider]  menthol-cetylpyridinium (CEPACOL) 3 MG lozenge Take 1 lozenge by mouth every 4 (four) hours as needed for sore throat.     [provider]  Meth-Hyo-M Bl-Na Phos-Ph Sal (URO-MP) 118 MG CAPS Take 1 capsule by mouth 3 (three) times daily.    [provider]  metoprolol succinate (TOPROL-XL) 25 MG 24 hr tablet Take 0.5 tablets (12.5 mg total) by mouth daily for 30 days. 03/12/18   Elodia Florence., MD  mirabegron ER (MYRBETRIQ) 50 MG TB24 tablet Take 1 tablet (50 mg total) by mouth daily. 01/28/16   McKenzie, Candee Furbish, MD  Multiple Vitamins-Minerals (PRESERVISION/LUTEIN) CAPS Take 1 capsule by  mouth 2 (two) times daily.    [provider]  polyethylene glycol (MIRALAX / GLYCOLAX) packet Take 17 g by mouth daily. [place]    [provider]  potassium chloride (K-DUR,KLOR-CON) 10 MEQ tablet Take 10 mEq by mouth daily. Dissolve in applesauce  [provider]  sertraline (ZOLOFT) 25 MG tablet Take 25 mg by mouth daily.    [provider]  traMADol (ULTRAM) 50 MG tablet Take 1 tablet (50 mg total) by mouth every 6 (six) hours as needed. 12/22/18 12/22/19  Cleon Gustin, MD    Allergies    Colchicine, Hibiclens [chlorhexidine], Atorvastatin, Ceftin, Celebrex [celecoxib], Codeine, Erythromycin, Ezetimibe, Iodinated diagnostic agents, Oxycodone-acetaminophen, Rosuvastatin, Simvastatin, Cefpodoxime, Cefuroxime axetil, Nitroglycerin, Tetanus toxoid, and Vantin  Review of Systems   Review of Systems  Unable to perform ROS: Mental status change    Physical Exam Updated Vital Signs BP (!) 154/82   Pulse 72   Temp 98 F (36.7 C) (Oral)   Resp 18   Ht 5\' 9"  (1.753 m)   Wt 70.3 kg   SpO2 96%   BMI 22.89 kg/m   Physical Exam Vitals and nursing note reviewed.  Constitutional:      General: He is not in acute distress.    Appearance: He is well-developed. He is not ill-appearing or diaphoretic.  HENT:     Head: Normocephalic.     Comments: Contusion and laceration, left eyebrow region.  No associated crepitation or deformity.    Right Ear: External ear normal.     Left Ear: External ear normal.     Mouth/Throat:     Mouth: Mucous membranes are moist.  Eyes:     General: No scleral icterus.       Right eye: No discharge.        Left eye: No discharge.     Conjunctiva/sclera: Conjunctivae normal.     Pupils: Pupils are equal, round, and reactive to light.  Neck:     Trachea: Phonation normal.  Cardiovascular:     Rate and Rhythm: Normal rate and regular rhythm.     Heart sounds: Normal heart sounds.  Pulmonary:     Effort:  Pulmonary effort is normal.     Breath sounds: Normal breath sounds.  Chest:     Chest wall: No tenderness.  Abdominal:     Palpations: Abdomen is soft.     Tenderness: There is no abdominal tenderness.  Musculoskeletal:        General: Normal range of motion.     Cervical back: Normal range of motion and neck supple.  Skin:    General: Skin is warm and dry.  Neurological:     Mental Status: He is alert.     Cranial Nerves: No cranial nerve deficit.     Sensory: No sensory deficit.     Motor: No abnormal muscle tone.     Coordination: Coordination normal.     Comments: No dysarthria  Psychiatric:        Mood and Affect: Mood normal.        Behavior: Behavior normal.     ED Results / Procedures / Treatments   Labs (all labs ordered are listed, but only abnormal results are displayed) Labs Reviewed  BASIC METABOLIC PANEL - Abnormal; Notable for the following components:      Result Value   Glucose, Bld 197 (*)    BUN 32 (*)    Creatinine, Ser 1.70 (*)    Calcium 8.2 (*)    GFR calc non Af Amer 36 (*)    GFR calc Af Amer 42 (*)    All other components within normal limits  CBC WITH DIFFERENTIAL/PLATELET - Abnormal; Notable for the following components:   RBC 4.14 (*)  Hemoglobin 11.0 (*)    HCT 35.5 (*)    Eosinophils Absolute 0.8 (*)    All other components within normal limits    EKG None  Radiology CT Head Wo Contrast  Result Date: 12/30/2018 CLINICAL DATA:  85 year old male status post fall in bathroom striking his face on the shower. Laceration over the left eye. EXAM: CT HEAD WITHOUT CONTRAST CT MAXILLOFACIAL WITHOUT CONTRAST CT CERVICAL SPINE WITHOUT CONTRAST TECHNIQUE: Multidetector CT imaging of the head, cervical spine, and maxillofacial structures were performed using the standard protocol without intravenous contrast. Multiplanar CT image reconstructions of the cervical spine and maxillofacial structures were also generated. COMPARISON:  Most recent  prior CT scan of the head and cervical spine Jun 05, 2016 FINDINGS: CT HEAD FINDINGS Brain: No evidence of acute infarction, hemorrhage, hydrocephalus, extra-axial collection or mass lesion/mass effect. Advanced central and cortical atrophy involving both the cerebrum and cerebellum. Compensatory ex vacuo dilatation of the lateral ventricles. Circumscribed low-attenuation defect in the left basal ganglia consistent with remote lacunar infarct. Mild to moderate periventricular white matter hypoattenuation most consistent with chronic microvascular ischemic white matter disease. Vascular: No hyperdense vessel or unexpected calcification. Skull: Normal. Negative for fracture or focal lesion. Other: None. CT MAXILLOFACIAL FINDINGS Osseous: Acute segmental fracture involving the left nasal bone. Minimal medial displacement. Orbits: Negative. No traumatic or inflammatory finding. Sinuses: Largely clear. Mild mucoperiosteal thickening in the sphenoid air cell and along the nasal septum. Soft tissues: Soft tissue irregularity with associated contusion over the left frontal bone superior to the orbit. Preseptal soft tissue swelling extends over the orbit and down the left face overlying the maxillary process. CT CERVICAL SPINE FINDINGS Alignment: Within normal limits. Skull base and vertebrae: No acute fracture or malalignment. Mild and likely chronic degenerative anterolisthesis of C7 on T1 measures approximately 4 mm. Soft tissues and spinal canal: No prevertebral fluid or swelling. No visible canal hematoma. Disc levels: Multilevel degenerative disc disease. Advanced bilateral facet arthropathy with multilevel ankylosis worse on the right than the left. Upper chest: Negative. Other: None. IMPRESSION: CT HEAD 1. No acute intracranial abnormality. 2. Chronic atrophy, remote left basilar lacunar infarcts and chronic microvascular ischemic white matter disease without significant interval progression. CT FACE 1. Acute mildly  medially displaced fracture of the left nasal bone. 2. Soft tissue laceration and contusion overlying the left brow with associated preseptal soft tissue contusion extending inferiorly over the left maxilla. CT CSPINE 1. No acute fracture or malalignment. 2. Advanced bilateral (right worse than left) facet arthropathy with multilevel ankylosis. Electronically Signed   By: Jacqulynn Cadet M.D.   On: 12/30/2018 15:53   CT Cervical Spine Wo Contrast  Result Date: 12/30/2018 CLINICAL DATA:  86 year old male status post fall in bathroom striking his face on the shower. Laceration over the left eye. EXAM: CT HEAD WITHOUT CONTRAST CT MAXILLOFACIAL WITHOUT CONTRAST CT CERVICAL SPINE WITHOUT CONTRAST TECHNIQUE: Multidetector CT imaging of the head, cervical spine, and maxillofacial structures were performed using the standard protocol without intravenous contrast. Multiplanar CT image reconstructions of the cervical spine and maxillofacial structures were also generated. COMPARISON:  Most recent prior CT scan of the head and cervical spine Jun 05, 2016 FINDINGS: CT HEAD FINDINGS Brain: No evidence of acute infarction, hemorrhage, hydrocephalus, extra-axial collection or mass lesion/mass effect. Advanced central and cortical atrophy involving both the cerebrum and cerebellum. Compensatory ex vacuo dilatation of the lateral ventricles. Circumscribed low-attenuation defect in the left basal ganglia consistent with remote lacunar infarct. Mild to moderate periventricular  white matter hypoattenuation most consistent with chronic microvascular ischemic white matter disease. Vascular: No hyperdense vessel or unexpected calcification. Skull: Normal. Negative for fracture or focal lesion. Other: None. CT MAXILLOFACIAL FINDINGS Osseous: Acute segmental fracture involving the left nasal bone. Minimal medial displacement. Orbits: Negative. No traumatic or inflammatory finding. Sinuses: Largely clear. Mild mucoperiosteal  thickening in the sphenoid air cell and along the nasal septum. Soft tissues: Soft tissue irregularity with associated contusion over the left frontal bone superior to the orbit. Preseptal soft tissue swelling extends over the orbit and down the left face overlying the maxillary process. CT CERVICAL SPINE FINDINGS Alignment: Within normal limits. Skull base and vertebrae: No acute fracture or malalignment. Mild and likely chronic degenerative anterolisthesis of C7 on T1 measures approximately 4 mm. Soft tissues and spinal canal: No prevertebral fluid or swelling. No visible canal hematoma. Disc levels: Multilevel degenerative disc disease. Advanced bilateral facet arthropathy with multilevel ankylosis worse on the right than the left. Upper chest: Negative. Other: None. IMPRESSION: CT HEAD 1. No acute intracranial abnormality. 2. Chronic atrophy, remote left basilar lacunar infarcts and chronic microvascular ischemic white matter disease without significant interval progression. CT FACE 1. Acute mildly medially displaced fracture of the left nasal bone. 2. Soft tissue laceration and contusion overlying the left brow with associated preseptal soft tissue contusion extending inferiorly over the left maxilla. CT CSPINE 1. No acute fracture or malalignment. 2. Advanced bilateral (right worse than left) facet arthropathy with multilevel ankylosis. Electronically Signed   By: Jacqulynn Cadet M.D.   On: 12/30/2018 15:53   CT MAXILLOFACIAL WO CONTRAST  Result Date: 12/30/2018 CLINICAL DATA:  85 year old male status post fall in bathroom striking his face on the shower. Laceration over the left eye. EXAM: CT HEAD WITHOUT CONTRAST CT MAXILLOFACIAL WITHOUT CONTRAST CT CERVICAL SPINE WITHOUT CONTRAST TECHNIQUE: Multidetector CT imaging of the head, cervical spine, and maxillofacial structures were performed using the standard protocol without intravenous contrast. Multiplanar CT image reconstructions of the cervical  spine and maxillofacial structures were also generated. COMPARISON:  Most recent prior CT scan of the head and cervical spine Jun 05, 2016 FINDINGS: CT HEAD FINDINGS Brain: No evidence of acute infarction, hemorrhage, hydrocephalus, extra-axial collection or mass lesion/mass effect. Advanced central and cortical atrophy involving both the cerebrum and cerebellum. Compensatory ex vacuo dilatation of the lateral ventricles. Circumscribed low-attenuation defect in the left basal ganglia consistent with remote lacunar infarct. Mild to moderate periventricular white matter hypoattenuation most consistent with chronic microvascular ischemic white matter disease. Vascular: No hyperdense vessel or unexpected calcification. Skull: Normal. Negative for fracture or focal lesion. Other: None. CT MAXILLOFACIAL FINDINGS Osseous: Acute segmental fracture involving the left nasal bone. Minimal medial displacement. Orbits: Negative. No traumatic or inflammatory finding. Sinuses: Largely clear. Mild mucoperiosteal thickening in the sphenoid air cell and along the nasal septum. Soft tissues: Soft tissue irregularity with associated contusion over the left frontal bone superior to the orbit. Preseptal soft tissue swelling extends over the orbit and down the left face overlying the maxillary process. CT CERVICAL SPINE FINDINGS Alignment: Within normal limits. Skull base and vertebrae: No acute fracture or malalignment. Mild and likely chronic degenerative anterolisthesis of C7 on T1 measures approximately 4 mm. Soft tissues and spinal canal: No prevertebral fluid or swelling. No visible canal hematoma. Disc levels: Multilevel degenerative disc disease. Advanced bilateral facet arthropathy with multilevel ankylosis worse on the right than the left. Upper chest: Negative. Other: None. IMPRESSION: CT HEAD 1. No acute intracranial abnormality. 2. Chronic  atrophy, remote left basilar lacunar infarcts and chronic microvascular ischemic white  matter disease without significant interval progression. CT FACE 1. Acute mildly medially displaced fracture of the left nasal bone. 2. Soft tissue laceration and contusion overlying the left brow with associated preseptal soft tissue contusion extending inferiorly over the left maxilla. CT CSPINE 1. No acute fracture or malalignment. 2. Advanced bilateral (right worse than left) facet arthropathy with multilevel ankylosis. Electronically Signed   By: Jacqulynn Cadet M.D.   On: 12/30/2018 15:53    Procedures Procedures (including critical care time)  Medications Ordered in ED Medications  lidocaine-EPINEPHrine (XYLOCAINE W/EPI) 2 %-1:200000 (PF) injection 20 mL (has no administration in time range)  sodium chloride 0.9 % bolus 500 mL (has no administration in time range)    ED Course  I have reviewed the triage vital signs and the nursing notes.  Pertinent labs & imaging results that were available during my care of the patient were reviewed by me and considered in my medical decision making (see chart for details).  Clinical Course as of Dec 30 1638  Fri Dec 30, 2018  1635 Normal except hemoglobin low 11.0  CBC with Differential(!) [EW]  1635 Normal except glucose high, BUN high, creatinine high, calcium low, GFR low   [EW]  1636 Per radiology, no intracranial or cervical lesions.  Face CT indicates left nasal bone fracture no other facial fractures.   [EW]    Clinical Course User Index [EW] Daleen Bo, MD   MDM Rules/Calculators/A&P                       Patient Vitals for the past 24 hrs:  BP Temp Temp src Pulse Resp SpO2 Height Weight  12/30/18 1445 (!) 154/82 -- -- 72 18 96 % -- --  12/30/18 1415 -- -- -- -- -- -- 5\' 9"  (1.753 m) 70.3 kg  12/30/18 1402 139/81 98 F (36.7 C) Oral 74 15 98 % -- --    4:38 PM Reevaluation with update and discussion. After initial assessment and treatment, an updated evaluation reveals no change in clinical status.  PA Green, will  suture laceration. Daleen Bo   Medical Decision Making: Fall, likely mechanical.  No serious injuries found on CT imaging.  Laceration face requiring suture closure.  Screening labs indicate mild hemoconcentration with increased creatinine from baseline.  Patient treated with IV fluid bolus, and will give oral challenge, to improve status.  Patient should be able to be discharged with if he is able to drink and appropriate, ambulate.  CRITICAL CARE-no Performed by: Daleen Bo  Nursing Notes Reviewed/ Care Coordinated Applicable Imaging Reviewed Interpretation of Laboratory Data incorporated into ED treatment   Disposition by oncoming team, following completion of treatment.   Final Clinical Impression(s) / ED Diagnoses Final diagnoses:  Facial laceration, initial encounter  Fall, initial encounter  Renal insufficiency    Rx / DC Orders ED Discharge Orders    None       Daleen Bo, MD 12/30/18 1640

## 2018-12-30 NOTE — Discharge Instructions (Signed)
Please read the attachment on sutured wound care.  Please keep bandages tightly wrapped given patient on blood thinners.    You will need to have the sutures removed in approximately 7 days.  Please continue to wrap the wound to help prevent continued bleeding.  You also have a small, well-approximated laceration immediately below your left eye.  Very thin skin and was not sutured, will need to continue with pressure dressing to achieve hemostasis.  Return to ED or seek medical attention for new or worsening symptoms.  Patient denies any dizziness or lightheadedness at time of discharge or with ambulation, but be mindful for dizziness as it could indicate anemia.

## 2018-12-30 NOTE — ED Provider Notes (Addendum)
..  Laceration Repair  Date/Time: 12/30/2018 4:38 PM Performed by: Corena Herter, PA-C Authorized by: Corena Herter, PA-C   Consent:    Consent obtained:  Verbal   Consent given by:  Patient   Risks discussed:  Infection, need for additional repair, pain, poor cosmetic result, poor wound healing, retained foreign body, tendon damage, vascular damage and nerve damage   Alternatives discussed:  No treatment and delayed treatment Universal protocol:    Procedure explained and questions answered to patient or proxy's satisfaction: yes     Relevant documents present and verified: yes     Test results available and properly labeled: yes     Imaging studies available: yes     Required blood products, implants, devices, and special equipment available: yes     Site/side marked: yes     Immediately prior to procedure, a time out was called: yes     Patient identity confirmed:  Verbally with patient Anesthesia (see MAR for exact dosages):    Anesthesia method:  Local infiltration   Local anesthetic:  Lidocaine 1% WITH epi and lidocaine 2% WITH epi Laceration details:    Location:  Face   Face location:  L eyebrow   Length (cm):  6 Repair type:    Repair type:  Simple Pre-procedure details:    Preparation:  Patient was prepped and draped in usual sterile fashion Exploration:    Hemostasis achieved with:  Epinephrine and direct pressure   Wound exploration: entire depth of wound probed and visualized   Treatment:    Area cleansed with:  Saline   Amount of cleaning:  Standard   Irrigation solution:  Sterile saline   Irrigation volume:  150 mL   Irrigation method:  Syringe Skin repair:    Repair method:  Sutures   Suture size:  5-0   Suture material:  Prolene   Suture technique:  Simple interrupted   Number of sutures:  7 Post-procedure details:    Dressing:  Sterile dressing   Patient tolerance of procedure:  Tolerated well, no immediate complications    Before and after  the procedure the motor function, strength, sensation, and circulatory function was assessed.  After the procedure only sensory changes attributable to the local anesthetic. Dr. Melina Copa also put eyes on the patient as he had a small, well-approximated but continuing to bleed cut below his eye. He recommended tightly wrapped pressure bandage over eye and lacerations.  Patient will then ambulate and should be able to be discharged home.   Spoke with his sister as well as the nursing facility, [facility], to make them aware of this progression and impending discharge. They voiced understanding and are agreeable to plan. He was able to ambulate with nursing staff assistance, without any issue. He feels ready for discharge. Discussed return precautions and he voiced understanding and is agreeable to plan.      Corena Herter, PA-C 12/30/18 1744    Corena Herter, PA-C 12/30/18 2027    Hayden Rasmussen, MD 12/31/18 1046

## 2018-12-30 NOTE — ED Notes (Signed)
Called ptar at [phone]

## 2018-12-30 NOTE — ED Notes (Signed)
Please call sister franky at [phone]

## 2018-12-30 NOTE — Telephone Encounter (Signed)
Received update from Dottie at [facility] that patient was sent to ED s/p fall. Will update hospital liaisons and Palliative NP.

## 2019-01-04 ENCOUNTER — Non-Acute Institutional Stay: Payer: Medicare Other | Admitting: Internal Medicine

## 2019-01-04 DIAGNOSIS — Z515 Encounter for palliative care: Secondary | ICD-10-CM

## 2019-01-05 ENCOUNTER — Other Ambulatory Visit: Payer: Self-pay

## 2019-01-05 ENCOUNTER — Non-Acute Institutional Stay: Payer: Medicare Other | Admitting: Internal Medicine

## 2019-01-05 DIAGNOSIS — W19XXXD Unspecified fall, subsequent encounter: Secondary | ICD-10-CM

## 2019-01-05 DIAGNOSIS — Z515 Encounter for palliative care: Secondary | ICD-10-CM

## 2019-01-09 ENCOUNTER — Encounter: Payer: Self-pay | Admitting: Internal Medicine

## 2019-01-09 NOTE — Progress Notes (Signed)
Dec 24th, 2020 [facility] Palliative Care Consult Note Telephone: [phone]  Fax: [phone]   PATIENT NAME: [name] ("patch") DOB: 05-30-1931 MRN: [id number] [facility] [address] (move in date 04/30/2016)   PRIMARY CARE PROVIDER:  Lajean Manes, MD [address] [place] [address]   Dr. Aline August ([facility]) [phone]  Dr. Virgina Organ (Cardiology)     REFERRING PROVIDER: [place]: (sister) [facility] [phone]. ([name]) [name] [phone]    ASSESSMENT / RECOMMENDATIONS:  1. Advance Care Planning:             A. Directives: DNR (present in facility chart; uploaded into [facility]/EMR)             B. Goals of Care: awaiting lifting of COVID facility restrictions so can visit his sister/little dog   2.Cognitive / Functional status: Patient continues A & O. He's on the quiet side, but in good spirits. He is currently restricted to his room d/t facility wide COVID restrictions. He walks about in his room with a walker for stability. Episodic constipation; currently not a problem. Denies pain or dyspnea. He had a slip and a fall about 6 days ago resulting in an ER visit where he was treated for an above the L eye laceration.   3. Family Supports: Spouse (second marriage) deceased a few years ago; unexpectedly d/t flu. He misses her. Has children from first marriage not directly engaged. Sister visits with patient's little dog. She stopped by this morning but couldn't see her brother d/t COVID restrictions.   4. Follow up Palliative Care Visit: 1-2 months. Spoke to sister [name] and gave update of visit   I spent 30 minutes providing this consultation from 10:30am-11am. More than 50% of the time in this consultation was spent coordinating communication.    HISTORY OF PRESENT ILLNESS:  [name] is an 85 y.o. M with hx CVA, prosCa s/p radX, BPH, Parkinsons disease (with dementia), CAD  (STEMI 2019), hypothyroidism, HTN, DM (neuropathy), Afib (Eliquis) and CKD III (baseline creatine 1.3). Hydronephrosis / nephrolithiasis (stent placement. Prior episodes demand ischemia setting of Afib with RVR. 12/30/2018: ER laceration over L eyebrow after a fall; sutured  12/22/2018: R flank pain with hematuria; known R ureteral calculi with stent placed 1 yr ago. He had canceled his stone extraction. On 12/22/2018: R stent placement Hospital admission:  11/1-11/02/2018: treated for Hgb 6.8 (tx 1 unit PRBCs). Afib with RVR (converted to NSR). FOBN. Demand ischemia with elevation Troponin.   Palliative Care was asked to help address goals of care.    CODE STATUS: DNR   PPS: 60%   HOSPICE ELIGIBILITY/DIAGNOSIS: TBD  PAST MEDICAL HISTORY:  Past Medical History:  Diagnosis Date  . Allergic rhinitis   . Arthritis    arthritis-kyphosis  . Asthmatic bronchitis   . CKD (chronic kidney disease) stage 3, GFR 30-59 ml/min   . DM type 2 (diabetes mellitus, type 2) ([place])   . DVT, lower extremity, proximal, acute ([place])   . Dyslipidemia   . GERD (gastroesophageal reflux disease)   . Hearing impaired    bilateral hearing aids  . History of kidney stones   . History of skin cancer   . Hypertension   . Hypothyroidism   . Macular degeneration    injections done bilaterally recent - 08-22-13  . PAF (paroxysmal atrial fibrillation) ([place])   . Prostate cancer [facility])    Prostate cancer-radiation  only,skin cancer-basal cell and last squamous cell right eye  . Stroke ([place])   . Urgency-frequency syndrome   . Urticaria     SOCIAL HX:  Social History   Tobacco Use  . Smoking status: Never Smoker  . Smokeless tobacco: Never Used  Substance Use Topics  . Alcohol use: No    ALLERGIES:  Allergies  Allergen Reactions  . Colchicine Anaphylaxis  . Hibiclens [Chlorhexidine] Hives  . Atorvastatin Other (See Comments)    Joint pain   . Ceftin Diarrhea  . Celebrex [Celecoxib] Swelling  . Codeine  Hives  . Erythromycin Diarrhea  . Ezetimibe Other (See Comments)    Joint pain   . Iodinated Diagnostic Agents Nausea And Vomiting and Rash  . Oxycodone-Acetaminophen Hives  . Rosuvastatin Other (See Comments)    Joint pain   . Simvastatin Other (See Comments)    Joint pain   . Cefpodoxime Rash  . Cefuroxime Axetil Rash  . Nitroglycerin Other (See Comments)    Lowers patient's B/P  . Tetanus Toxoid Rash  . Vantin Other (See Comments)    Unknown/not noted on MAR??     PERTINENT MEDICATIONS:  Outpatient Encounter Medications as of 01/05/2019  Medication Sig  . alfuzosin (UROXATRAL) 10 MG 24 hr tablet Take 1 tablet (10 mg total) by mouth at bedtime.  . Alogliptin Benzoate 25 MG TABS Take 25 mg by mouth.  Marland Kitchen apixaban (ELIQUIS) 2.5 MG TABS tablet Take 2.5 mg by mouth 2 (two) times daily.  . benzonatate (TESSALON) 100 MG capsule Take 100 mg by mouth every 8 (eight) hours as needed for cough.   . brimonidine-timolol (COMBIGAN) 0.2-0.5 % ophthalmic solution Place 1 drop into both eyes 2 (two) times daily.  . brinzolamide (AZOPT) 1 % ophthalmic suspension Place 1 drop into the right eye 3 (three) times daily.   . carbidopa-levodopa (SINEMET IR) 25-100 MG tablet Take 1 tablet by mouth 3 (three) times daily.   . Carboxymethylcellulose Sodium (ARTIFICIAL TEARS OP) Place 1 drop into both eyes 4 (four) times daily.  . Cholecalciferol (VITAMIN D-3) 5000 units TABS Take 5,000 Units by mouth daily.  . clopidogrel (PLAVIX) 75 MG tablet Take 75 mg by mouth daily.   . colesevelam (WELCHOL) 625 MG tablet Take 1,875 mg by mouth every evening.   . diltiazem (CARDIZEM CD) 120 MG 24 hr capsule Take 1 capsule (120 mg total) by mouth daily for 30 days.  Marland Kitchen docusate sodium (COLACE) 100 MG capsule Take 100 mg by mouth 2 (two) times daily.   . ferrous sulfate 325 (65 FE) MG EC tablet Take 1 tablet (325 mg total) by mouth 2 (two) times daily with a meal.  . fluticasone (FLONASE) 50 MCG/ACT nasal spray Place 2  sprays into both nostrils daily.  . furosemide (LASIX) 20 MG tablet Take 20 mg by mouth daily.  Marland Kitchen gabapentin (NEURONTIN) 100 MG capsule Take 100 mg by mouth at bedtime.  Marland Kitchen latanoprost (XALATAN) 0.005 % ophthalmic solution Place 1 drop into the right eye at bedtime.   Marland Kitchen levocetirizine (XYZAL) 5 MG tablet Take 2.5 mg by mouth at bedtime.   Marland Kitchen levothyroxine (SYNTHROID) 150 MCG tablet Take 150 mcg by mouth daily.   . Melatonin 3 MG SUBL Place 3 mg under the tongue at bedtime.  . Menthol, Topical Analgesic, (ZIMS MAX-FREEZE EX) Apply 1 application topically every 8 (eight) hours as needed (pain). Apply to ankles  . menthol-cetylpyridinium (CEPACOL) 3 MG lozenge Take 1 lozenge by mouth every 4 (four)  hours as needed for sore throat.   . Meth-Hyo-M Bl-Na Phos-Ph Sal (URO-MP) 118 MG CAPS Take 1 capsule by mouth 3 (three) times daily.  . metoprolol succinate (TOPROL-XL) 25 MG 24 hr tablet Take 0.5 tablets (12.5 mg total) by mouth daily for 30 days.  . mirabegron ER (MYRBETRIQ) 50 MG TB24 tablet Take 1 tablet (50 mg total) by mouth daily.  . Multiple Vitamins-Minerals (PRESERVISION/LUTEIN) CAPS Take 1 capsule by mouth 2 (two) times daily.  . polyethylene glycol (MIRALAX / GLYCOLAX) packet Take 17 g by mouth daily. [place]  . potassium chloride (K-DUR,KLOR-CON) 10 MEQ tablet Take 10 mEq by mouth daily. Dissolve in applesauce  . sertraline (ZOLOFT) 25 MG tablet Take 25 mg by mouth daily.  . traMADol (ULTRAM) 50 MG tablet Take 1 tablet (50 mg total) by mouth every 6 (six) hours as needed.   No facility-administered encounter medications on file as of 01/05/2019.    PHYSICAL EXAM:   General: NAD, frail appearing, slender sitting up in recliner; above L eyebrow healing laceration. No signs infection/inflammation.  Below L eye below left outer edge bruise Cardiovascular: regular rate and rhythm Pulmonary: clear ant fields Abdomen: soft, nontender, + bowel sounds GU: no suprapubic  tenderness Extremities: no edema, no joint deformities Skin: no rashes Neurological: Weakness but otherwise nonfocal    Julianne Handler, NP

## 2019-01-11 NOTE — Progress Notes (Signed)
Appointment scheduled in error.  Patient not seen; no charge. Thierry Dobosz NP-C 

## 2019-02-27 ENCOUNTER — Other Ambulatory Visit: Payer: Self-pay

## 2019-02-27 ENCOUNTER — Ambulatory Visit (INDEPENDENT_AMBULATORY_CARE_PROVIDER_SITE_OTHER): Payer: Medicare Other | Admitting: Podiatry

## 2019-02-27 ENCOUNTER — Encounter: Payer: Self-pay | Admitting: Podiatry

## 2019-02-27 DIAGNOSIS — M2042 Other hammer toe(s) (acquired), left foot: Secondary | ICD-10-CM

## 2019-02-27 DIAGNOSIS — Q828 Other specified congenital malformations of skin: Secondary | ICD-10-CM | POA: Diagnosis not present

## 2019-02-27 DIAGNOSIS — M2041 Other hammer toe(s) (acquired), right foot: Secondary | ICD-10-CM

## 2019-02-27 DIAGNOSIS — E1122 Type 2 diabetes mellitus with diabetic chronic kidney disease: Secondary | ICD-10-CM | POA: Diagnosis not present

## 2019-02-27 DIAGNOSIS — E119 Type 2 diabetes mellitus without complications: Secondary | ICD-10-CM

## 2019-02-27 DIAGNOSIS — M79674 Pain in right toe(s): Secondary | ICD-10-CM

## 2019-02-27 DIAGNOSIS — N183 Chronic kidney disease, stage 3 unspecified: Secondary | ICD-10-CM | POA: Diagnosis not present

## 2019-02-27 DIAGNOSIS — M79675 Pain in left toe(s): Secondary | ICD-10-CM | POA: Diagnosis not present

## 2019-02-27 DIAGNOSIS — B351 Tinea unguium: Secondary | ICD-10-CM | POA: Diagnosis not present

## 2019-02-27 NOTE — Patient Instructions (Signed)
Diabetes Mellitus and Foot Care Foot care is an important part of your health, especially when you have diabetes. Diabetes may cause you to have problems because of poor blood flow (circulation) to your feet and legs, which can cause your skin to:  Become thinner and drier.  Break more easily.  Heal more slowly.  Peel and crack. You may also have nerve damage (neuropathy) in your legs and feet, causing decreased feeling in them. This means that you may not notice minor injuries to your feet that could lead to more serious problems. Noticing and addressing any potential problems early is the best way to prevent future foot problems. How to care for your feet Foot hygiene  Wash your feet daily with warm water and mild soap. Do not use hot water. Then, pat your feet and the areas between your toes until they are completely dry. Do not soak your feet as this can dry your skin.  Trim your toenails straight across. Do not dig under them or around the cuticle. File the edges of your nails with an emery board or nail file.  Apply a moisturizing lotion or petroleum jelly to the skin on your feet and to dry, brittle toenails. Use lotion that does not contain alcohol and is unscented. Do not apply lotion between your toes. Shoes and socks  Wear clean socks or stockings every day. Make sure they are not too tight. Do not wear knee-high stockings since they may decrease blood flow to your legs.  Wear shoes that fit properly and have enough cushioning. Always look in your shoes before you put them on to be sure there are no objects inside.  To break in new shoes, wear them for just a few hours a day. This prevents injuries on your feet. Wounds, scrapes, corns, and calluses  Check your feet daily for blisters, cuts, bruises, sores, and redness. If you cannot see the bottom of your feet, use a mirror or ask someone for help.  Do not cut corns or calluses or try to remove them with medicine.  If you  find a minor scrape, cut, or break in the skin on your feet, keep it and the skin around it clean and dry. You may clean these areas with mild soap and water. Do not clean the area with peroxide, alcohol, or iodine.  If you have a wound, scrape, corn, or callus on your foot, look at it several times a day to make sure it is healing and not infected. Check for: ? Redness, swelling, or pain. ? Fluid or blood. ? Warmth. ? Pus or a bad smell. General instructions  Do not cross your legs. This may decrease blood flow to your feet.  Do not use heating pads or hot water bottles on your feet. They may burn your skin. If you have lost feeling in your feet or legs, you may not know this is happening until it is too late.  Protect your feet from hot and cold by wearing shoes, such as at the beach or on hot pavement.  Schedule a complete foot exam at least once a year (annually) or more often if you have foot problems. If you have foot problems, report any cuts, sores, or bruises to your health care provider immediately. Contact a health care provider if:  You have a medical condition that increases your risk of infection and you have any cuts, sores, or bruises on your feet.  You have an injury that is not   healing.  You have redness on your legs or feet.  You feel burning or tingling in your legs or feet.  You have pain or cramps in your legs and feet.  Your legs or feet are numb.  Your feet always feel cold.  You have pain around a toenail. Get help right away if:  You have a wound, scrape, corn, or callus on your foot and: ? You have pain, swelling, or redness that gets worse. ? You have fluid or blood coming from the wound, scrape, corn, or callus. ? Your wound, scrape, corn, or callus feels warm to the touch. ? You have pus or a bad smell coming from the wound, scrape, corn, or callus. ? You have a fever. ? You have a red line going up your leg. Summary  Check your feet every day  for cuts, sores, red spots, swelling, and blisters.  Moisturize feet and legs daily.  Wear shoes that fit properly and have enough cushioning.  If you have foot problems, report any cuts, sores, or bruises to your health care provider immediately.  Schedule a complete foot exam at least once a year (annually) or more often if you have foot problems. This information is not intended to replace advice given to you by your health care provider. Make sure you discuss any questions you have with your health care provider. Document Revised: 09/21/2018 Document Reviewed: 01/31/2016 Elsevier Patient Education  2020 [employer] Inc.  

## 2019-03-02 NOTE — Progress Notes (Signed)
Subjective: [name] presents today for follow up of preventative diabetic foot care and painful mycotic nails b/l that are difficult to trim. Pain interferes with ambulation. Aggravating factors include wearing enclosed shoe gear. Pain is relieved with periodic professional debridement.   Allergies  Allergen Reactions  . Colchicine Anaphylaxis  . Hibiclens [Chlorhexidine] Hives  . Atorvastatin Other (See Comments)    Joint pain   . Ceftin Diarrhea  . Celebrex [Celecoxib] Swelling  . Codeine Hives  . Erythromycin Diarrhea  . Ezetimibe Other (See Comments)    Joint pain   . Iodinated Diagnostic Agents Nausea And Vomiting and Rash  . Oxycodone-Acetaminophen Hives  . Rosuvastatin Other (See Comments)    Joint pain   . Simvastatin Other (See Comments)    Joint pain   . Cefpodoxime Rash  . Cefuroxime Axetil Rash  . Nitroglycerin Other (See Comments)    Lowers patient's B/P  . Tetanus Toxoid Rash  . Vantin Other (See Comments)    Unknown/not noted on MAR??     Objective: There were no vitals filed for this visit.  Vascular Examination:  Capillary fill time to digits <3s b/l, palpable DP pulses b/l, faintly palpable PT pulses b/l, pedal hair sparse b/l and skin temperature gradient within normal limits b/l  Dermatological Examination: Pedal skin is thin shiny, atrophic bilaterally, no open wounds bilaterally, no interdigital macerations bilaterally, toenails 1-5 b/l elongated, dystrophic, thickened, crumbly with subungual debris, porokeratotic lesion(s) two located on plantar aspect of both heels b/l for total of 4 with tenderness to palpation. No erythema, no edema, no drainage, no flocculence and incurvated nailplates b/l great toes b/l borders with tenderness to palpation.  Musculoskeletal: Noted disuse atrophy b/l LE, no pain crepitus or joint limitation noted with ROM b/l and hammertoes noted to the  L 5th toe and R 5th toe  Neurological: Protective sensation intact 5/5  intact bilaterally with 10g monofilament b/l and vibratory sensation intact b/l  Assessment: 1. Pain due to onychomycosis of toenails of both feet   2. Porokeratosis   3. Acquired hammertoes of both feet   4. Type 2 diabetes mellitus with stage 3 chronic kidney disease, without long-term current use of insulin, unspecified whether stage 3a or 3b CKD ([place])   5. Encounter for diabetic foot exam [facility])    Plan: Diabetic foot examination performed on today's visit. Orders written on [facility] for staff to [employer] daily, perform daily inspection for pressure spots of heels.  -Discussed diabetic foot care principles. Literature dispensed on today. -Toenails 1-5 b/l were debrided in length and girth without iatrogenic bleeding. -Painful porokeratotic lesions plantar aspect of both feet x 4 pared and enucleated with sterile scalpel blade -Patient to continue soft, supportive shoe gear daily. -Patient to report any pedal injuries to medical professional immediately. -Patient/POA to call should there be question/concern in the interim.  Return in about 3 months (around 05/27/2019) for diabetic nail trim.

## 2019-04-11 ENCOUNTER — Other Ambulatory Visit: Payer: Self-pay

## 2019-04-11 ENCOUNTER — Encounter: Payer: Self-pay | Admitting: Internal Medicine

## 2019-04-11 ENCOUNTER — Non-Acute Institutional Stay: Payer: Medicare Other | Admitting: Internal Medicine

## 2019-04-11 DIAGNOSIS — Z515 Encounter for palliative care: Secondary | ICD-10-CM

## 2019-04-11 DIAGNOSIS — Z7189 Other specified counseling: Secondary | ICD-10-CM

## 2019-04-11 NOTE — Progress Notes (Signed)
March 30th, 2020 [facility] Palliative Care Consult Note Telephone: [phone]  Fax: [phone]   PATIENT NAME: [name] ("patch") DOB: 04-Aug-1931 MRN: [id number] [facility] [address] (move in date 04/30/2016)   PRIMARY CARE PROVIDER:  Lajean Manes, MD [address] [place] [address]   Dr. Aline August ([facility]) [phone]  Dr. Virgina Organ (Cardiology)   REFERRING PROVIDER: [place]: (sister) [facility] [phone]. ([name]) [name] [phone]    ASSESSMENT / RECOMMENDATIONS:  1. Advance Care Planning:             A. Directives: DNR (present in facility chart; uploaded into [facility])             B. Goals of Care: awaiting further lifting of COVID facility restrictions so can more often visit his sister/little dog. He's trying to keep his weight stable at 157lbs or so, and to keep up his exercise regimen ambulating as much as he can within the hallway and room. He has had a h/o obesity.    2.Cognitive / Functional status: Patient continues A & O, conversant and on topic. He didin't remember me but I haven't seen him for about 3 months. Staff report a mild cognitive decline; having difficulty operating his TV remote now. Occasional confusion as to events. He walks about in his room without assistive devices, short distances in hall with walker for stability, and longer trips with his electric scooter. He has been having more problems with swallowing his pills. Says it's okay to crush them but would prefer not as then it tastes bad. Speech therapy eval; recommended a swallow study but patient defers. Current weight stable at 158.6lbs. At 5'9" his BMI is 23.5kg/m2. He is continent of bowel and bladder; occasional urinary incontinence.    3. Family Supports: Spouse (second marriage) deceased a few years ago; unexpectedly d/t flu. He misses her. Has children from first marriage not directly  engaged. Sister [name] has visited in past with patient's little dog. [name] took patient out of facility for some business at the law office. Patient really enjoyed this time outside.   4. Follow up Palliative Care Visit: 2-3 months. Spoke to sister [name] and gave update of visit   I spent 30 minutes providing this consultation from 10:30am-11am. More than 50% of the time in this consultation was spent coordinating communication.    HISTORY OF PRESENT ILLNESS:  [name] is an 86 y.o. M with hx CVA, prosCa s/p radX, BPH,  Parkinsons disease (with dementia), CAD (STEMI 2019), hypothyroidism, HTN, DM (neuropathy), Afib (Eliquis) and CKD III (baseline creatine 1.3). Hydronephrosis / nephrolithiasis (stent placement. Prior episodes demand ischemia setting of Afib with RVR. 12/30/2018: ER laceration over L eyebrow after a fall; sutured  12/22/2018: R flank pain with hematuria; known R ureteral calculi with stent placed 1 yr ago. He had canceled his stone extraction. On 12/22/2018: R stent placement Hospital admission:  11/1-11/02/2018: treated for Hgb 6.8 (tx 1 unit PRBCs). Afib with RVR (converted to NSR). FOBN. Demand ischemia with elevation Troponin.   This is a f/u Palliative Care visit from 01/05/2019.    CODE STATUS: DNR   PPS: 60%   HOSPICE ELIGIBILITY/DIAGNOSIS: TBD    PAST MEDICAL HISTORY:  Past Medical History:  Diagnosis Date  . Allergic rhinitis   . Arthritis    arthritis-kyphosis  . Asthmatic bronchitis   . CKD (chronic kidney disease) stage 3, GFR  30-59 ml/min   . DM type 2 (diabetes mellitus, type 2) ([place])   . DVT, lower extremity, proximal, acute ([place])   . Dyslipidemia   . GERD (gastroesophageal reflux disease)   . Hearing impaired    bilateral hearing aids  . History of kidney stones   . History of skin cancer   . Hypertension   . Hypothyroidism   . Macular degeneration    injections done bilaterally recent - 08-22-13  . PAF (paroxysmal atrial fibrillation)  ([place])   . Prostate cancer [facility])    Prostate cancer-radiation only,skin cancer-basal cell and last squamous cell right eye  . Stroke ([place])   . Urgency-frequency syndrome   . Urticaria     SOCIAL HX:  Social History   Tobacco Use  . Smoking status: Never Smoker  . Smokeless tobacco: Never Used  Substance Use Topics  . Alcohol use: No    ALLERGIES:  Allergies  Allergen Reactions  . Colchicine Anaphylaxis  . Hibiclens [Chlorhexidine] Hives  . Atorvastatin Other (See Comments)    Joint pain   . Ceftin Diarrhea  . Celebrex [Celecoxib] Swelling  . Codeine Hives  . Erythromycin Diarrhea  . Ezetimibe Other (See Comments)    Joint pain   . Iodinated Diagnostic Agents Nausea And Vomiting and Rash  . Oxycodone-Acetaminophen Hives  . Rosuvastatin Other (See Comments)    Joint pain   . Simvastatin Other (See Comments)    Joint pain   . Cefpodoxime Rash  . Cefuroxime Axetil Rash  . Nitroglycerin Other (See Comments)    Lowers patient's B/P  . Tetanus Toxoid Rash  . Vantin Other (See Comments)    Unknown/not noted on MAR??     PERTINENT MEDICATIONS:  Outpatient Encounter Medications as of 04/11/2019  Medication Sig  . alfuzosin (UROXATRAL) 10 MG 24 hr tablet Take 1 tablet (10 mg total) by mouth at bedtime.  . Alogliptin Benzoate 12.5 MG TABS Take 12.5 mg by mouth.   Marland Kitchen apixaban (ELIQUIS) 2.5 MG TABS tablet Take 2.5 mg by mouth 2 (two) times daily.  . benzonatate (TESSALON) 100 MG capsule Take 100 mg by mouth every 8 (eight) hours as needed for cough.   . brimonidine-timolol (COMBIGAN) 0.2-0.5 % ophthalmic solution Place 1 drop into both eyes 2 (two) times daily.  . brinzolamide (AZOPT) 1 % ophthalmic suspension Place 1 drop into the right eye 3 (three) times daily.   . carbidopa-levodopa (SINEMET IR) 25-100 MG tablet Take 1 tablet by mouth 3 (three) times daily.   . Carboxymethylcellulose Sodium (ARTIFICIAL TEARS OP) Place 1 drop into both eyes 4 (four) times daily.  .  Cholecalciferol (VITAMIN D-3) 5000 units TABS Take 5,000 Units by mouth daily.  . clopidogrel (PLAVIX) 75 MG tablet Take 75 mg by mouth daily.   . colesevelam (WELCHOL) 625 MG tablet Take 1,875 mg by mouth every evening.   . diltiazem (CARDIZEM CD) 120 MG 24 hr capsule Take 1 capsule (120 mg total) by mouth daily for 30 days.  Marland Kitchen docusate sodium (COLACE) 100 MG capsule Take 100 mg by mouth 2 (two) times daily.   . ferrous sulfate 325 (65 FE) MG EC tablet Take 1 tablet (325 mg total) by mouth 2 (two) times daily with a meal.  . fluticasone (FLONASE) 50 MCG/ACT nasal spray Place 2 sprays into both nostrils daily.  . furosemide (LASIX) 20 MG tablet Take 20 mg by mouth daily.  Marland Kitchen latanoprost (XALATAN) 0.005 % ophthalmic solution Place 1 drop into the right  eye at bedtime.   Marland Kitchen levocetirizine (XYZAL) 5 MG tablet Take 2.5 mg by mouth at bedtime.   Marland Kitchen levothyroxine (SYNTHROID) 150 MCG tablet Take 150 mcg by mouth daily.   . Melatonin 3 MG SUBL Place 3 mg under the tongue at bedtime.  . Menthol, Topical Analgesic, (ZIMS MAX-FREEZE EX) Apply 1 application topically every 8 (eight) hours as needed (pain). Apply to ankles  . menthol-cetylpyridinium (CEPACOL) 3 MG lozenge Take 1 lozenge by mouth every 4 (four) hours as needed for sore throat.   . Meth-Hyo-M Bl-Na Phos-Ph Sal (URO-MP) 118 MG CAPS Take 1 capsule by mouth 3 (three) times daily.  . metoprolol succinate (TOPROL-XL) 25 MG 24 hr tablet Take 0.5 tablets (12.5 mg total) by mouth daily for 30 days.  . mirabegron ER (MYRBETRIQ) 50 MG TB24 tablet Take 1 tablet (50 mg total) by mouth daily.  . Multiple Vitamins-Minerals (PRESERVISION/LUTEIN) CAPS Take 1 capsule by mouth 2 (two) times daily.  . polyethylene glycol (MIRALAX / GLYCOLAX) packet Take 17 g by mouth daily. [place]  . potassium chloride (K-DUR,KLOR-CON) 10 MEQ tablet Take 10 mEq by mouth daily. Dissolve in applesauce  . sertraline (ZOLOFT) 25 MG tablet Take 25 mg by mouth daily.  . traMADol  (ULTRAM) 50 MG tablet Take 1 tablet (50 mg total) by mouth every 6 (six) hours as needed.  . [DISCONTINUED] diltiazem (TIAZAC) 240 MG 24 hr capsule Take by mouth.  . [DISCONTINUED] gabapentin (NEURONTIN) 100 MG capsule Take 100 mg by mouth at bedtime.   No facility-administered encounter medications on file as of 04/11/2019.    PHYSICAL EXAM:   BP 118/66, HR 72, RR 16  A & O x 3. Conversant and on topic. Good recall to recent and past events.  General: NAD, frail appearing, slender sitting up in recliner; above L eyebrow healing laceration. No signs infection/inflammation. Cardiovascular: regular rate and rhythm, soft atrial gallop Pulmonary: Clear fields, RLL insp gurgle that cleared with cough. Abdomen: soft, nontender, + bowel sounds GU: no suprapubic tenderness Extremities: no edema, no joint deformities. Compression hose in place Skin: no rashes Neurological: Weakness but otherwise non-focal    Julianne Handler, NP

## 2019-04-18 ENCOUNTER — Other Ambulatory Visit: Payer: Self-pay

## 2019-04-18 ENCOUNTER — Non-Acute Institutional Stay: Payer: Medicare Other | Admitting: Internal Medicine

## 2019-04-18 ENCOUNTER — Other Ambulatory Visit (HOSPITAL_COMMUNITY): Payer: Self-pay

## 2019-04-18 VITALS — Wt 158.0 lb

## 2019-04-18 DIAGNOSIS — Z7189 Other specified counseling: Secondary | ICD-10-CM

## 2019-04-18 DIAGNOSIS — R1310 Dysphagia, unspecified: Secondary | ICD-10-CM

## 2019-04-18 DIAGNOSIS — Z515 Encounter for palliative care: Secondary | ICD-10-CM

## 2019-04-18 DIAGNOSIS — R059 Cough, unspecified: Secondary | ICD-10-CM

## 2019-04-18 DIAGNOSIS — R05 Cough: Secondary | ICD-10-CM

## 2019-04-18 NOTE — Progress Notes (Signed)
March 30th, 2020 [facility] Palliative Care Consult Note Telephone: [phone]  Fax: [phone]  PATIENT NAME: [name] ("patch") DOB:[birth year] [id number] [facility] [address] (move in date 04/30/2016)  PRIMARY CARE PROVIDER: Lajean Manes, MD [address] [place]  [address]  Dr. Aline [name] ([facility]) [phone]  Dr. Virgina [name] (Cardiology)  REFERRING PROVIDER: [place] [name]:(sister) [name] [phone]. ([name]) [name] [phone]  ASSESSMENT / RECOMMENDATIONS: 1. Advance Care Planning: A. Directives:DNR (present in facility chart; uploaded into [facility]) B. Goals of Care:awaiting further lifting of COVID facility restrictions so can more often visit his sister/little dog. He's trying to keep his weight stable at 157lbs or so, and to keep up his exercise regimen ambulating as much as he can within the hallway and room. He has had a h/o obesity.   2.Cognitive / Functional status:Patient is alert and conversant. He's a little more confused today, but that could be d/t poor hearing as he answers different questions than what I've asked. He's having a bit of trouble getting the volume down on his remote, but it does have a lot of confusing buttons.   Facility Surveyor, quantity requested I talk to patient about having a swallowing study (as recommended by speech therapy), to evaluate increasing difficulty with taking his meds (especially larger pills),and a clearing cough after eating and drinking. Patient reports his difficulty with swallowing only occurs when he eats salty foods, or takes larger pills. I suggested that if we evaluated this with a formal study, that we could come up with some modifications that might improve the situation. But patient mentions that he had a study done last year, so he was fine without further evaluation. There  was no budging him.   -Consider discontinuation of small dose Lasix with associated large Potassium tab, to help pare down pill burden.  Patient continues to ambulate about in his room without assistive devices, short distances in hall with walker for stability, and longer trips with his electric scooter. Current weight stable at 158lbs. At 5'9" his BMI is 23.5kg/m2. He is continent of bowel and bladder; occasional urinary incontinence.  3. Family Supports:Spouse (second marriage) deceased a few years ago; unexpectedly d/t flu. He misses her. Has children from first marriage not directly engaged. Sister [name] has visited in past with patient's little dog.   4. Follow up Palliative Care Visit:2-3 months.  I spent62minutes providing this consultationfrom 3pm-3:30pm.More than 50% of the time in this consultation was spent coordinating communication.   HISTORY OF PRESENT ILLNESS:[name] an 86 y.o.Mwith hx CVA, prosCa s/p radX, BPH,  Parkinsons disease (with dementia), CAD(STEMI 2019), hypothyroidism, HTN, DM (neuropathy), Afib (Eliquis) and CKD III(baselinecreatine 1.3). Hydronephrosis / nephrolithiasis (stent placement. Prior episodes demand ischemia setting of Afib with RVR. 12/30/2018: ER laceration over L eyebrow after a fall; sutured  12/22/2018: R flank pain with hematuria; known R ureteral calculi with stent placed 1 yr ago. He had canceled his stone extraction. On 12/22/2018: R stent placement Hospital admission:  11/1-11/02/2018: treated for Hgb 6.8 (tx 1 unit PRBCs). Afib with RVR (converted to NSR). FOBN. Demand ischemia with elevation Troponin.  This is a f/u Palliative Care visit from 04/11/2019.   CODE STATUS: DNR  PPS:60%  HOSPICE ELIGIBILITY/DIAGNOSIS:TBD  PAST MEDICAL HISTORY:  Past Medical History:  Diagnosis Date  . Allergic rhinitis   . Arthritis    arthritis-kyphosis  . Asthmatic bronchitis   . CKD (chronic kidney  disease) stage 3, GFR  30-59 ml/min   . DM type 2 (diabetes mellitus, type 2) ([place])   . DVT, lower extremity, proximal, acute ([place])   . Dyslipidemia   . GERD (gastroesophageal reflux disease)   . Hearing impaired    bilateral hearing aids  . History of kidney stones   . History of skin cancer   . Hypertension   . Hypothyroidism   . Macular degeneration    injections done bilaterally recent - 08-22-13  . PAF (paroxysmal atrial fibrillation) ([place])   . Prostate cancer [facility])    Prostate cancer-radiation only,skin cancer-basal cell and last squamous cell right eye  . Stroke ([place])   . Urgency-frequency syndrome   . Urticaria     SOCIAL HX:  Social History   Tobacco Use  . Smoking status: Never Smoker  . Smokeless tobacco: Never Used  Substance Use Topics  . Alcohol use: No    ALLERGIES:  Allergies  Allergen Reactions  . Colchicine Anaphylaxis  . Hibiclens [Chlorhexidine] Hives  . Atorvastatin Other (See Comments)    Joint pain   . Ceftin Diarrhea  . Celebrex [Celecoxib] Swelling  . Codeine Hives  . Erythromycin Diarrhea  . Ezetimibe Other (See Comments)    Joint pain   . Iodinated Diagnostic Agents Nausea And Vomiting and Rash  . Oxycodone-Acetaminophen Hives  . Rosuvastatin Other (See Comments)    Joint pain   . Simvastatin Other (See Comments)    Joint pain   . Cefpodoxime Rash  . Cefuroxime Axetil Rash  . Nitroglycerin Other (See Comments)    Lowers patient's B/P  . Tetanus Toxoid Rash  . Vantin Other (See Comments)    Unknown/not noted on MAR??     PERTINENT MEDICATIONS:  Outpatient Encounter Medications as of 04/18/2019  Medication Sig  . alfuzosin (UROXATRAL) 10 MG 24 hr tablet Take 1 tablet (10 mg total) by mouth at bedtime.  . Alogliptin Benzoate 12.5 MG TABS Take 12.5 mg by mouth.   Marland Kitchen apixaban (ELIQUIS) 2.5 MG TABS tablet Take 2.5 mg by mouth 2 (two) times daily.  . benzonatate (TESSALON) 100 MG capsule Take 100 mg by mouth every 8 (eight) hours as needed for cough.   .  brimonidine-timolol (COMBIGAN) 0.2-0.5 % ophthalmic solution Place 1 drop into both eyes 2 (two) times daily.  . brinzolamide (AZOPT) 1 % ophthalmic suspension Place 1 drop into the right eye 3 (three) times daily.   . carbidopa-levodopa (SINEMET IR) 25-100 MG tablet Take 1 tablet by mouth 3 (three) times daily.   . Carboxymethylcellulose Sodium (ARTIFICIAL TEARS OP) Place 1 drop into both eyes 4 (four) times daily.  . Cholecalciferol (VITAMIN D-3) 5000 units TABS Take 5,000 Units by mouth daily.  . clopidogrel (PLAVIX) 75 MG tablet Take 75 mg by mouth daily.   . colesevelam (WELCHOL) 625 MG tablet Take 1,875 mg by mouth every evening.   . diltiazem (CARDIZEM CD) 120 MG 24 hr capsule Take 1 capsule (120 mg total) by mouth daily for 30 days.  Marland Kitchen docusate sodium (COLACE) 100 MG capsule Take 100 mg by mouth 2 (two) times daily.   . ferrous sulfate 325 (65 FE) MG EC tablet Take 1 tablet (325 mg total) by mouth 2 (two) times daily with a meal.  . fluticasone (FLONASE) 50 MCG/ACT nasal spray Place 2 sprays into both nostrils daily.  . furosemide (LASIX) 20 MG tablet Take 20 mg by mouth daily.  Marland Kitchen latanoprost (XALATAN) 0.005 % ophthalmic solution Place 1  drop into the right eye at bedtime.   Marland Kitchen levocetirizine (XYZAL) 5 MG tablet Take 2.5 mg by mouth at bedtime.   Marland Kitchen levothyroxine (SYNTHROID) 150 MCG tablet Take 150 mcg by mouth daily.   . Melatonin 3 MG SUBL Place 3 mg under the tongue at bedtime.  . Menthol, Topical Analgesic, (ZIMS MAX-FREEZE EX) Apply 1 application topically every 8 (eight) hours as needed (pain). Apply to ankles  . menthol-cetylpyridinium (CEPACOL) 3 MG lozenge Take 1 lozenge by mouth every 4 (four) hours as needed for sore throat.   . Meth-Hyo-M Bl-Na Phos-Ph Sal (URO-MP) 118 MG CAPS Take 1 capsule by mouth 3 (three) times daily.  . metoprolol succinate (TOPROL-XL) 25 MG 24 hr tablet Take 0.5 tablets (12.5 mg total) by mouth daily for 30 days.  . mirabegron ER (MYRBETRIQ) 50 MG TB24  tablet Take 1 tablet (50 mg total) by mouth daily.  . Multiple Vitamins-Minerals (PRESERVISION/LUTEIN) CAPS Take 1 capsule by mouth 2 (two) times daily.  . polyethylene glycol (MIRALAX / GLYCOLAX) packet Take 17 g by mouth daily. [place]  . potassium chloride (K-DUR,KLOR-CON) 10 MEQ tablet Take 10 mEq by mouth daily. Dissolve in applesauce  . sertraline (ZOLOFT) 25 MG tablet Take 25 mg by mouth daily.  . traMADol (ULTRAM) 50 MG tablet Take 1 tablet (50 mg total) by mouth every 6 (six) hours as needed.   No facility-administered encounter medications on file as of 04/18/2019.    PHYSICAL EXAM:    A & O x 3. Conversant. Engaged and pleasant General: NAD, frail appearing,slender sitting up in recline. PE deferred to limit COVID exposure Extremities: no edema, no joint deformities. Compression hose in place Skin: no rashes Neurological: Weakness but otherwise non-focal  Julianne Handler, NP

## 2019-04-19 ENCOUNTER — Encounter: Payer: Self-pay | Admitting: Internal Medicine

## 2019-04-24 ENCOUNTER — Other Ambulatory Visit (HOSPITAL_COMMUNITY): Payer: Self-pay

## 2019-04-24 DIAGNOSIS — R059 Cough, unspecified: Secondary | ICD-10-CM

## 2019-04-24 DIAGNOSIS — R1310 Dysphagia, unspecified: Secondary | ICD-10-CM

## 2019-04-24 DIAGNOSIS — R05 Cough: Secondary | ICD-10-CM

## 2019-04-25 ENCOUNTER — Ambulatory Visit (HOSPITAL_COMMUNITY)
Admission: RE | Admit: 2019-04-25 | Discharge: 2019-04-25 | Disposition: A | Discharge status: Home / Self Care | Payer: Medicare Other | Source: Ambulatory Visit | Attending: Nurse Practitioner | Admitting: Nurse Practitioner

## 2019-04-25 ENCOUNTER — Other Ambulatory Visit: Payer: Self-pay

## 2019-04-25 DIAGNOSIS — R1310 Dysphagia, unspecified: Secondary | ICD-10-CM | POA: Insufficient documentation

## 2019-04-25 DIAGNOSIS — R059 Cough, unspecified: Secondary | ICD-10-CM

## 2019-04-25 DIAGNOSIS — R05 Cough: Secondary | ICD-10-CM | POA: Insufficient documentation

## 2019-05-21 IMAGING — CT CT HIP*L* W/O CM
2 of 3 series · 17 of 46 positions shown, 19 images · non-contrast
Comparison: CT scan 01/25/2016

CLINICAL DATA: Left hip and groin pain.

EXAM:
CT OF THE LEFT HIP WITHOUT CONTRAST
TECHNIQUE: Multidetector CT imaging of the left hip was performed according to
the standard protocol. Multiplanar CT image reconstructions were
also generated.

[Series 4: pelvis 2.0 st · axial · 0.49mm/px · z∈[+1047,+1319]mm · 14 of 158 slices shown, 16 images]
[im 11/158  soft-tissue]
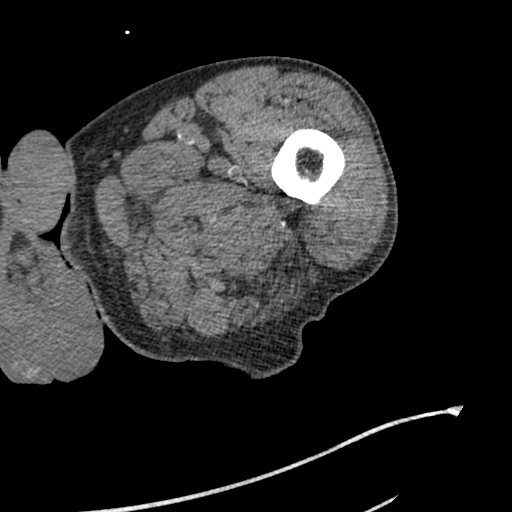
[im 11/158  bone]
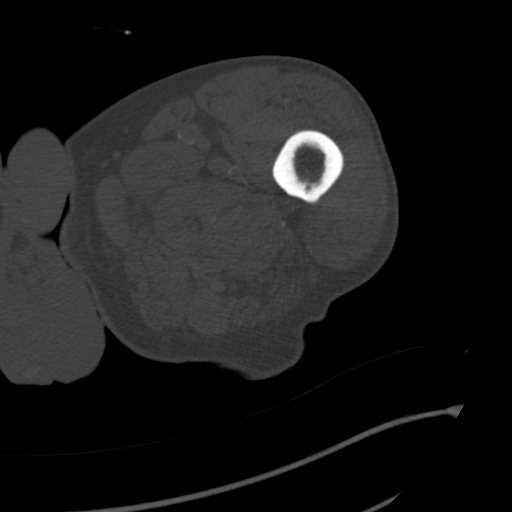
[im 21/158  soft-tissue]
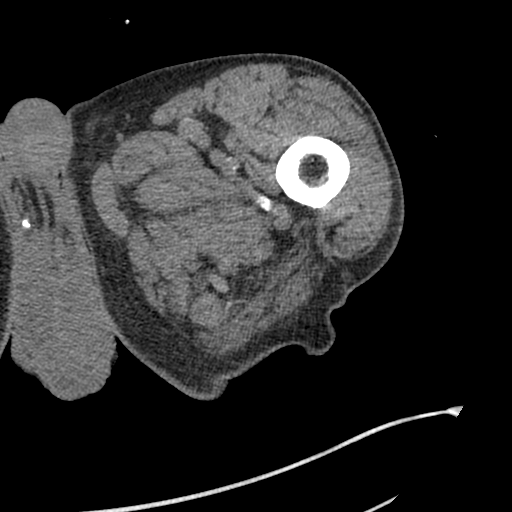
[im 31/158  soft-tissue]
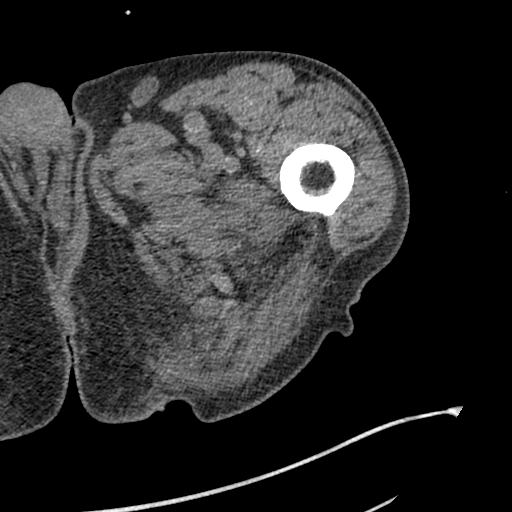
[im 41/158  soft-tissue]
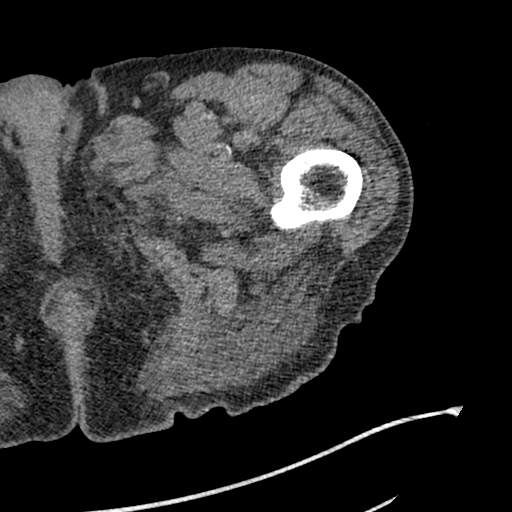
[im 51/158  soft-tissue]
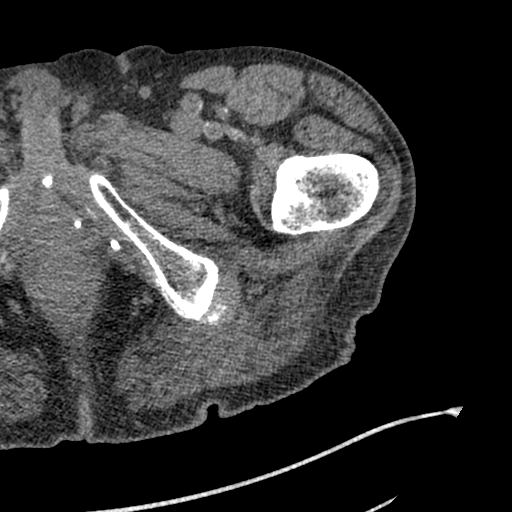
[im 61/158  soft-tissue]
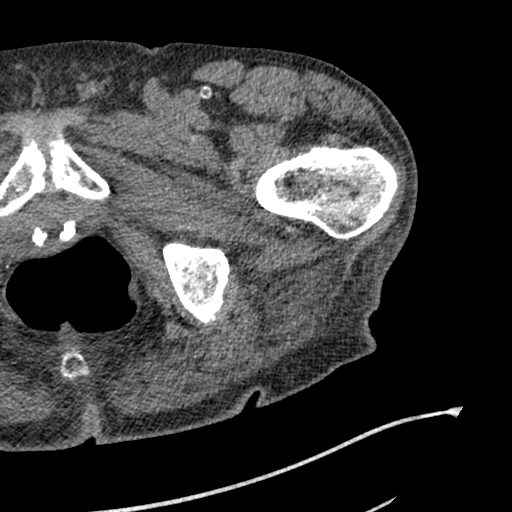
[im 71/158  soft-tissue]
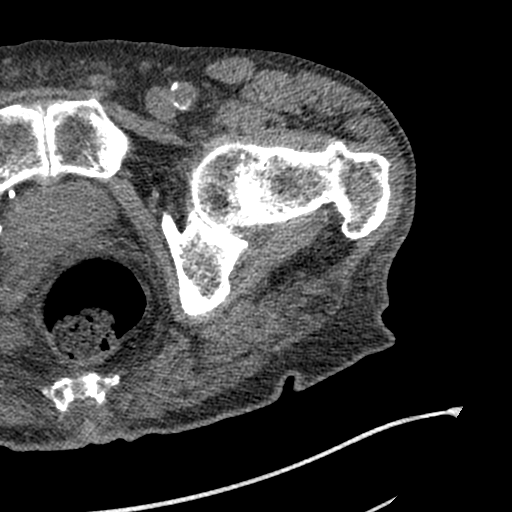
[im 87/158  soft-tissue]
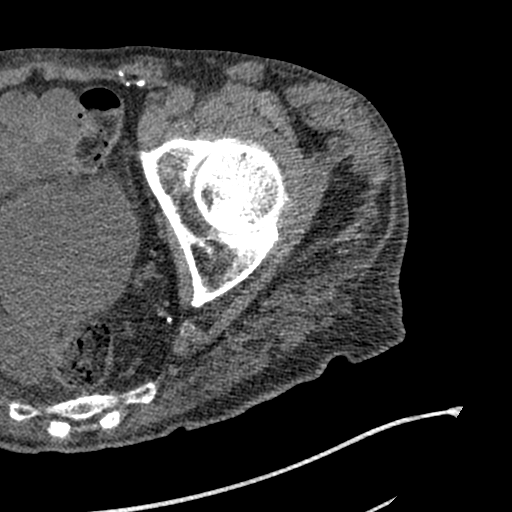
[im 97/158  soft-tissue]
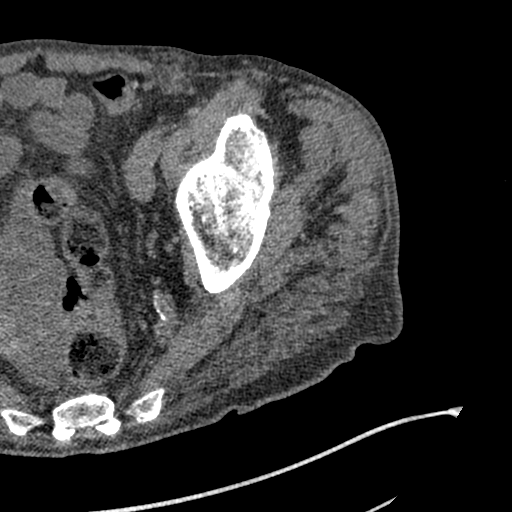
[im 97/158  bone]
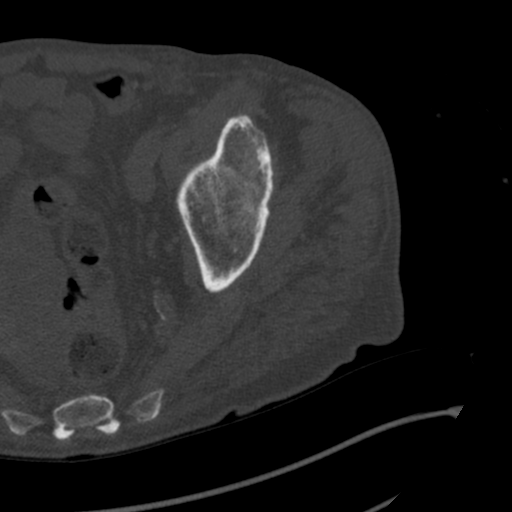
[im 107/158  soft-tissue]
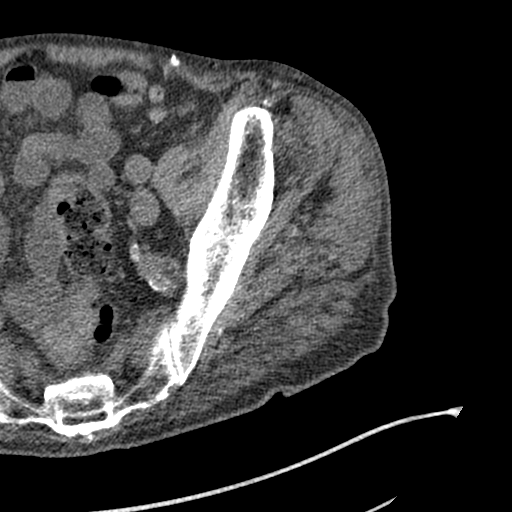
[im 117/158  soft-tissue]
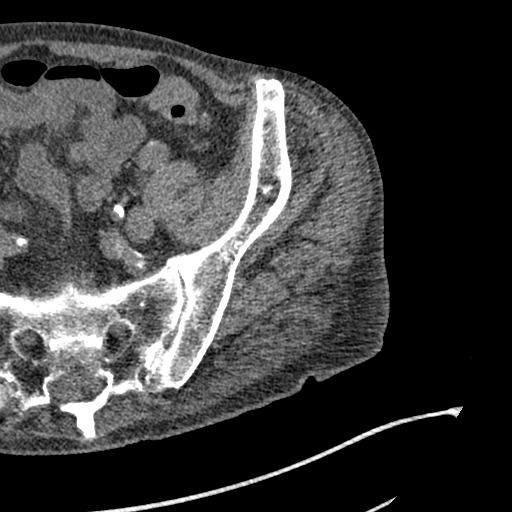
[im 127/158  soft-tissue]
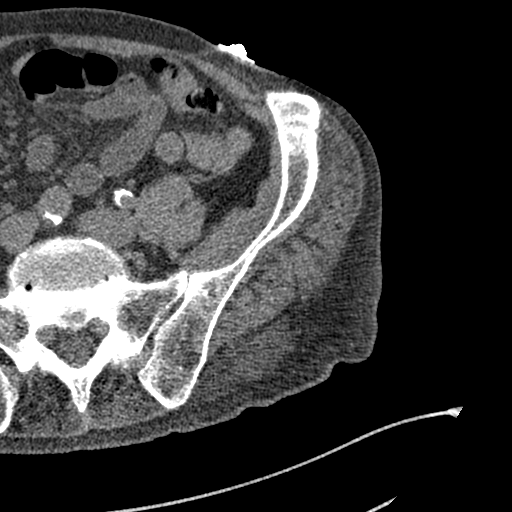
[im 137/158  soft-tissue]
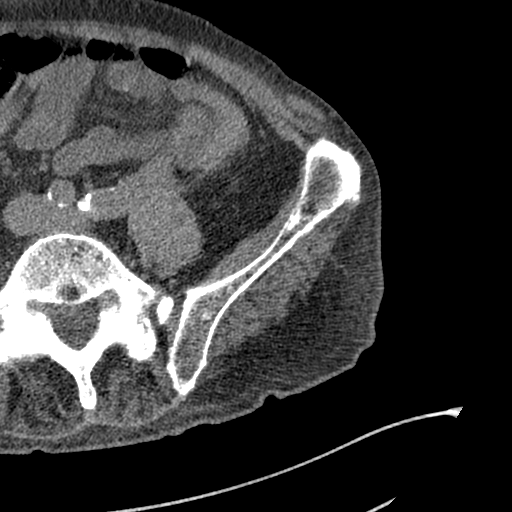
[im 147/158  soft-tissue]
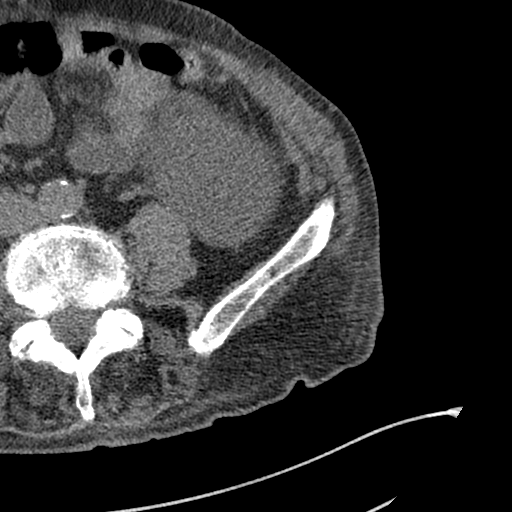

[Series 10: coronal st · coronal · 0.55mm/px · 3 of 123 slices shown]
[im 41/123  soft-tissue]
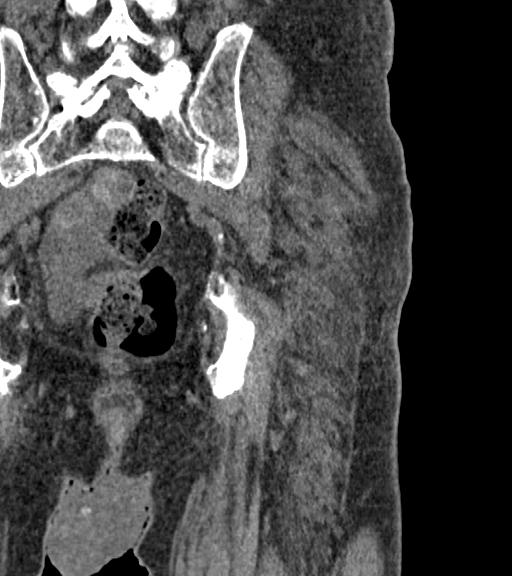
[im 55/123  soft-tissue]
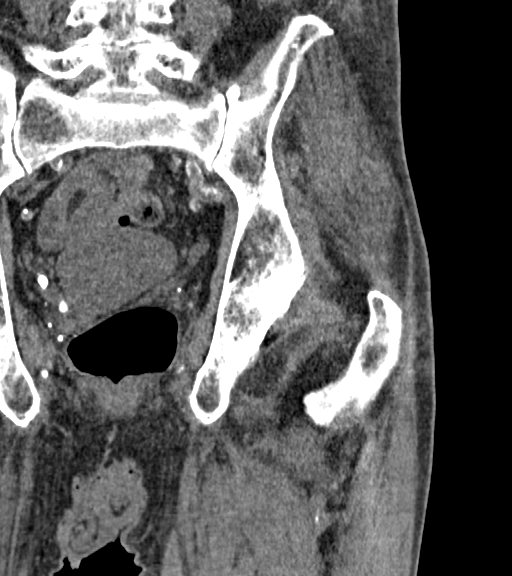
[im 68/123  soft-tissue]
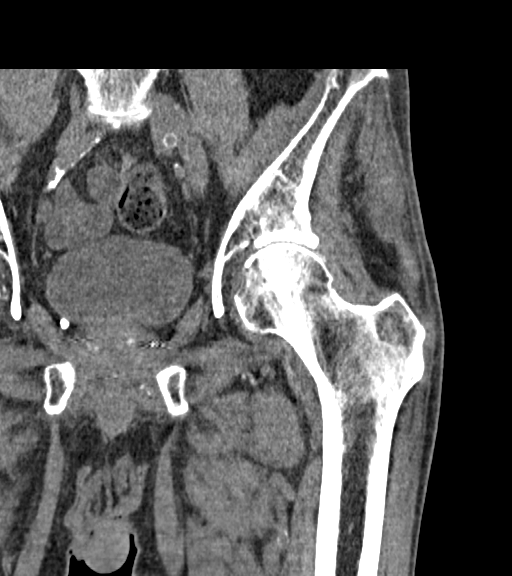

[17 of 46 positions shown; findings below may reference images not displayed]

FINDINGS: Moderate to advanced left hip joint degenerative changes but no
obvious fracture or AVN. Pubic symphysis and SI joint degenerative
changes with chondrocalcinosis but no fracture of the left
hemipelvis is identified.

The surrounding left hip and pelvic musculature are grossly normal.
No hematoma or abnormal fluid collection.

No inguinal mass or inguinal adenopathy.  No inguinal hernia.

Advanced facet disease is noted in the lower lumbar spine but no
pars defects.
IMPRESSION: 1. Moderate to advanced left hip joint degenerative changes but no
fracture or AVN.
2. Grossly normal CT appearance of the left hip and pelvic
musculature.
3. Lower lumbar facet disease along with left SI joint and pubic
symphysis degenerative changes but no definite pelvic fractures or
pelvic bone lesion.

## 2019-07-12 ENCOUNTER — Encounter (HOSPITAL_COMMUNITY): Payer: Self-pay | Admitting: Student

## 2019-07-12 ENCOUNTER — Other Ambulatory Visit: Payer: Self-pay

## 2019-07-12 ENCOUNTER — Observation Stay (HOSPITAL_COMMUNITY)
Admission: EM | Admit: 2019-07-12 | Discharge: 2019-07-13 | Disposition: A | Discharge status: Skilled Nursing Facility | Payer: Medicare Other | Attending: Family Medicine | Admitting: Family Medicine

## 2019-07-12 ENCOUNTER — Telehealth: Payer: Self-pay

## 2019-07-12 ENCOUNTER — Emergency Department (HOSPITAL_COMMUNITY): Payer: Medicare Other

## 2019-07-12 ENCOUNTER — Inpatient Hospital Stay (HOSPITAL_COMMUNITY): Payer: Medicare Other

## 2019-07-12 DIAGNOSIS — E1122 Type 2 diabetes mellitus with diabetic chronic kidney disease: Secondary | ICD-10-CM | POA: Insufficient documentation

## 2019-07-12 DIAGNOSIS — Z955 Presence of coronary angioplasty implant and graft: Secondary | ICD-10-CM | POA: Diagnosis not present

## 2019-07-12 DIAGNOSIS — I480 Paroxysmal atrial fibrillation: Secondary | ICD-10-CM

## 2019-07-12 DIAGNOSIS — I4891 Unspecified atrial fibrillation: Secondary | ICD-10-CM | POA: Diagnosis present

## 2019-07-12 DIAGNOSIS — I1310 Hypertensive heart and chronic kidney disease without heart failure, with stage 1 through stage 4 chronic kidney disease, or unspecified chronic kidney disease: Secondary | ICD-10-CM | POA: Diagnosis not present

## 2019-07-12 DIAGNOSIS — N1831 Chronic kidney disease, stage 3a: Secondary | ICD-10-CM | POA: Diagnosis not present

## 2019-07-12 DIAGNOSIS — R079 Chest pain, unspecified: Secondary | ICD-10-CM | POA: Diagnosis present

## 2019-07-12 DIAGNOSIS — Z886 Allergy status to analgesic agent status: Secondary | ICD-10-CM | POA: Insufficient documentation

## 2019-07-12 DIAGNOSIS — I252 Old myocardial infarction: Secondary | ICD-10-CM | POA: Diagnosis not present

## 2019-07-12 DIAGNOSIS — J45909 Unspecified asthma, uncomplicated: Secondary | ICD-10-CM | POA: Insufficient documentation

## 2019-07-12 DIAGNOSIS — Z888 Allergy status to other drugs, medicaments and biological substances status: Secondary | ICD-10-CM | POA: Insufficient documentation

## 2019-07-12 DIAGNOSIS — R778 Other specified abnormalities of plasma proteins: Secondary | ICD-10-CM | POA: Insufficient documentation

## 2019-07-12 DIAGNOSIS — M1990 Unspecified osteoarthritis, unspecified site: Secondary | ICD-10-CM | POA: Diagnosis not present

## 2019-07-12 DIAGNOSIS — I2510 Atherosclerotic heart disease of native coronary artery without angina pectoris: Secondary | ICD-10-CM | POA: Diagnosis not present

## 2019-07-12 DIAGNOSIS — Z66 Do not resuscitate: Secondary | ICD-10-CM | POA: Insufficient documentation

## 2019-07-12 DIAGNOSIS — Z96653 Presence of artificial knee joint, bilateral: Secondary | ICD-10-CM | POA: Insufficient documentation

## 2019-07-12 DIAGNOSIS — E113299 Type 2 diabetes mellitus with mild nonproliferative diabetic retinopathy without macular edema, unspecified eye: Secondary | ICD-10-CM | POA: Diagnosis not present

## 2019-07-12 DIAGNOSIS — Z79899 Other long term (current) drug therapy: Secondary | ICD-10-CM | POA: Insufficient documentation

## 2019-07-12 DIAGNOSIS — E890 Postprocedural hypothyroidism: Secondary | ICD-10-CM | POA: Insufficient documentation

## 2019-07-12 DIAGNOSIS — F329 Major depressive disorder, single episode, unspecified: Secondary | ICD-10-CM | POA: Diagnosis not present

## 2019-07-12 DIAGNOSIS — I10 Essential (primary) hypertension: Secondary | ICD-10-CM | POA: Diagnosis present

## 2019-07-12 DIAGNOSIS — Z8546 Personal history of malignant neoplasm of prostate: Secondary | ICD-10-CM | POA: Diagnosis not present

## 2019-07-12 DIAGNOSIS — N183 Chronic kidney disease, stage 3 unspecified: Secondary | ICD-10-CM | POA: Diagnosis present

## 2019-07-12 DIAGNOSIS — K219 Gastro-esophageal reflux disease without esophagitis: Secondary | ICD-10-CM | POA: Diagnosis present

## 2019-07-12 DIAGNOSIS — G20 Parkinson's disease: Secondary | ICD-10-CM | POA: Insufficient documentation

## 2019-07-12 DIAGNOSIS — E785 Hyperlipidemia, unspecified: Secondary | ICD-10-CM | POA: Diagnosis present

## 2019-07-12 DIAGNOSIS — E1129 Type 2 diabetes mellitus with other diabetic kidney complication: Secondary | ICD-10-CM | POA: Diagnosis present

## 2019-07-12 DIAGNOSIS — Z86718 Personal history of other venous thrombosis and embolism: Secondary | ICD-10-CM | POA: Insufficient documentation

## 2019-07-12 DIAGNOSIS — Z7902 Long term (current) use of antithrombotics/antiplatelets: Secondary | ICD-10-CM | POA: Diagnosis not present

## 2019-07-12 DIAGNOSIS — Z79890 Hormone replacement therapy (postmenopausal): Secondary | ICD-10-CM | POA: Diagnosis not present

## 2019-07-12 DIAGNOSIS — Z7901 Long term (current) use of anticoagulants: Secondary | ICD-10-CM | POA: Insufficient documentation

## 2019-07-12 DIAGNOSIS — Z885 Allergy status to narcotic agent status: Secondary | ICD-10-CM | POA: Insufficient documentation

## 2019-07-12 DIAGNOSIS — Z20822 Contact with and (suspected) exposure to covid-19: Secondary | ICD-10-CM | POA: Insufficient documentation

## 2019-07-12 DIAGNOSIS — R7989 Other specified abnormal findings of blood chemistry: Secondary | ICD-10-CM | POA: Diagnosis present

## 2019-07-12 DIAGNOSIS — Z881 Allergy status to other antibiotic agents status: Secondary | ICD-10-CM | POA: Insufficient documentation

## 2019-07-12 LAB — BASIC METABOLIC PANEL
Anion gap: 10 (ref 5–15)
BUN: 29 mg/dL — ABNORMAL HIGH (ref 8–23)
CO2: 24 mmol/L (ref 22–32)
Calcium: 8.8 mg/dL — ABNORMAL LOW (ref 8.9–10.3)
Chloride: 105 mmol/L (ref 98–111)
Creatinine, Ser: 1.33 mg/dL — ABNORMAL HIGH (ref 0.61–1.24)
GFR calc Af Amer: 56 mL/min — ABNORMAL LOW (ref >60)
GFR calc non Af Amer: 48 mL/min — ABNORMAL LOW (ref >60)
Glucose, Bld: 111 mg/dL — ABNORMAL HIGH (ref 70–99)
Potassium: 4.3 mmol/L (ref 3.5–5.1)
Sodium: 139 mmol/L (ref 135–145)

## 2019-07-12 LAB — MAGNESIUM: Magnesium: 2.1 mg/dL (ref 1.7–2.4)

## 2019-07-12 LAB — TROPONIN I (HIGH SENSITIVITY)
Troponin I (High Sensitivity): 31 ng/L — ABNORMAL HIGH (ref <18)
Troponin I (High Sensitivity): 31 ng/L — ABNORMAL HIGH (ref <18)

## 2019-07-12 LAB — URINALYSIS, COMPLETE (UACMP) WITH MICROSCOPIC

## 2019-07-12 LAB — ECHOCARDIOGRAM COMPLETE

## 2019-07-12 LAB — TSH: TSH: 0.421 u[IU]/mL (ref 0.350–4.500)

## 2019-07-12 LAB — CBC
HCT: 39.2 % (ref 39.0–52.0)
Hemoglobin: 12.7 g/dL — ABNORMAL LOW (ref 13.0–17.0)
MCH: 31.1 pg (ref 26.0–34.0)
MCHC: 32.4 g/dL (ref 30.0–36.0)
MCV: 96.1 fL (ref 80.0–100.0)
Platelets: 182 10*3/uL (ref 150–400)
RBC: 4.08 MIL/uL — ABNORMAL LOW (ref 4.22–5.81)
RDW: 12 % (ref 11.5–15.5)
WBC: 7 10*3/uL (ref 4.0–10.5)
nRBC: 0 % (ref 0.0–0.2)

## 2019-07-12 LAB — SARS CORONAVIRUS 2 BY RT PCR (HOSPITAL ORDER, PERFORMED IN ~~LOC~~ HOSPITAL LAB): SARS Coronavirus 2: NEGATIVE

## 2019-07-12 LAB — PHOSPHORUS: Phosphorus: 3.8 mg/dL (ref 2.5–4.6)

## 2019-07-12 MED ORDER — BRINZOLAMIDE 1 % OP SUSP
1.0000 [drp] | Freq: Three times a day (TID) | OPHTHALMIC | Status: DC
  Administered 2019-07-12 – 2019-07-13 (×3): 1 [drp] via OPHTHALMIC
  Filled 2019-07-12: qty 10

## 2019-07-12 MED ORDER — POLYETHYLENE GLYCOL 3350 17 G PO PACK
17.0000 g | PACK | Freq: Every day | ORAL | Status: DC
  Administered 2019-07-13 (×2): 17 g via ORAL
  Filled 2019-07-12 (×2): qty 1

## 2019-07-12 MED ORDER — BENZONATATE 100 MG PO CAPS: 100.0000 mg | ORAL_CAPSULE | Freq: Three times a day (TID) | ORAL | Status: DC | PRN

## 2019-07-12 MED ORDER — ONDANSETRON HCL 4 MG/2ML IJ SOLN: 4.0000 mg | Freq: Four times a day (QID) | INTRAMUSCULAR | Status: DC | PRN

## 2019-07-12 MED ORDER — CLOPIDOGREL BISULFATE 75 MG PO TABS
75.0000 mg | ORAL_TABLET | Freq: Every day | ORAL | Status: DC
  Administered 2019-07-12 – 2019-07-13 (×2): 75 mg via ORAL
  Filled 2019-07-12 (×2): qty 1

## 2019-07-12 MED ORDER — FERROUS SULFATE 325 (65 FE) MG PO TABS
325.0000 mg | ORAL_TABLET | Freq: Two times a day (BID) | ORAL | Status: DC
  Administered 2019-07-13 (×3): 325 mg via ORAL
  Filled 2019-07-12 (×4): qty 1

## 2019-07-12 MED ORDER — CARBIDOPA-LEVODOPA 25-100 MG PO TABS
1.0000 | ORAL_TABLET | Freq: Three times a day (TID) | ORAL | Status: DC
  Administered 2019-07-12 – 2019-07-13 (×5): 1 via ORAL
  Filled 2019-07-12 (×6): qty 1

## 2019-07-12 MED ORDER — URO-MP 118 MG PO CAPS: 1.0000 | ORAL_CAPSULE | Freq: Three times a day (TID) | ORAL | Status: DC

## 2019-07-12 MED ORDER — ALFUZOSIN HCL ER 10 MG PO TB24
10.0000 mg | ORAL_TABLET | Freq: Every day | ORAL | Status: DC
  Administered 2019-07-13: 10 mg via ORAL
  Filled 2019-07-12 (×3): qty 1

## 2019-07-12 MED ORDER — COLESEVELAM HCL 625 MG PO TABS
1875.0000 mg | ORAL_TABLET | Freq: Every evening | ORAL | Status: DC
  Administered 2019-07-13 (×2): 1875 mg via ORAL
  Filled 2019-07-12 (×3): qty 3

## 2019-07-12 MED ORDER — ACETAMINOPHEN 650 MG RE SUPP: 650.0000 mg | Freq: Four times a day (QID) | RECTAL | Status: DC | PRN

## 2019-07-12 MED ORDER — MENTHOL 3 MG MT LOZG
1.0000 | LOZENGE | OROMUCOSAL | Status: DC | PRN
  Filled 2019-07-12: qty 9

## 2019-07-12 MED ORDER — MIRABEGRON ER 25 MG PO TB24
50.0000 mg | ORAL_TABLET | Freq: Every day | ORAL | Status: DC
  Administered 2019-07-12 – 2019-07-13 (×2): 50 mg via ORAL
  Filled 2019-07-12: qty 2
  Filled 2019-07-12: qty 1

## 2019-07-12 MED ORDER — SERTRALINE HCL 50 MG PO TABS
25.0000 mg | ORAL_TABLET | Freq: Every day | ORAL | Status: DC
  Administered 2019-07-12 – 2019-07-13 (×2): 25 mg via ORAL
  Filled 2019-07-12 (×2): qty 1

## 2019-07-12 MED ORDER — BRIMONIDINE TARTRATE 0.2 % OP SOLN
1.0000 [drp] | Freq: Two times a day (BID) | OPHTHALMIC | Status: DC
  Administered 2019-07-13 (×2): 1 [drp] via OPHTHALMIC
  Filled 2019-07-12 (×2): qty 5

## 2019-07-12 MED ORDER — MELATONIN 3 MG PO TABS
3.0000 mg | ORAL_TABLET | Freq: Every day | ORAL | Status: DC
  Administered 2019-07-12: 3 mg via ORAL
  Filled 2019-07-12 (×3): qty 1

## 2019-07-12 MED ORDER — DILTIAZEM HCL-DEXTROSE 125-5 MG/125ML-% IV SOLN (PREMIX)
5.0000 mg/h | INTRAVENOUS | Status: DC
  Administered 2019-07-12: 10 mg/h via INTRAVENOUS
  Administered 2019-07-12: 5 mg/h via INTRAVENOUS
  Filled 2019-07-12 (×3): qty 125

## 2019-07-12 MED ORDER — PANTOPRAZOLE SODIUM 40 MG PO TBEC
40.0000 mg | DELAYED_RELEASE_TABLET | Freq: Every day | ORAL | Status: DC
  Administered 2019-07-12 – 2019-07-13 (×2): 40 mg via ORAL
  Filled 2019-07-12 (×2): qty 1

## 2019-07-12 MED ORDER — ACETAMINOPHEN 325 MG PO TABS: 650.0000 mg | ORAL_TABLET | Freq: Four times a day (QID) | ORAL | Status: DC | PRN

## 2019-07-12 MED ORDER — APIXABAN 2.5 MG PO TABS
2.5000 mg | ORAL_TABLET | Freq: Two times a day (BID) | ORAL | Status: DC
  Administered 2019-07-12 – 2019-07-13 (×3): 2.5 mg via ORAL
  Filled 2019-07-12 (×5): qty 1

## 2019-07-12 MED ORDER — DILTIAZEM LOAD VIA INFUSION
10.0000 mg | Freq: Once | INTRAVENOUS | Status: AC
  Administered 2019-07-12: 10 mg via INTRAVENOUS
  Filled 2019-07-12: qty 10

## 2019-07-12 MED ORDER — LATANOPROST 0.005 % OP SOLN
1.0000 [drp] | Freq: Every day | OPHTHALMIC | Status: DC
  Administered 2019-07-12: 1 [drp] via OPHTHALMIC
  Filled 2019-07-12: qty 2.5

## 2019-07-12 MED ORDER — BRIMONIDINE TARTRATE-TIMOLOL 0.2-0.5 % OP SOLN: 1.0000 [drp] | Freq: Two times a day (BID) | OPHTHALMIC | Status: DC

## 2019-07-12 MED ORDER — FUROSEMIDE 20 MG PO TABS
20.0000 mg | ORAL_TABLET | Freq: Every day | ORAL | Status: DC
  Administered 2019-07-12 – 2019-07-13 (×2): 20 mg via ORAL
  Filled 2019-07-12 (×2): qty 1

## 2019-07-12 MED ORDER — TIMOLOL MALEATE 0.5 % OP SOLN
1.0000 [drp] | Freq: Two times a day (BID) | OPHTHALMIC | Status: DC
  Administered 2019-07-12 – 2019-07-13 (×2): 1 [drp] via OPHTHALMIC
  Filled 2019-07-12: qty 5

## 2019-07-12 MED ORDER — DOCUSATE SODIUM 100 MG PO CAPS
100.0000 mg | ORAL_CAPSULE | Freq: Two times a day (BID) | ORAL | Status: DC
  Administered 2019-07-13 (×2): 100 mg via ORAL
  Filled 2019-07-12 (×3): qty 1

## 2019-07-12 MED ORDER — TRAMADOL HCL 50 MG PO TABS: 50.0000 mg | ORAL_TABLET | Freq: Four times a day (QID) | ORAL | Status: DC | PRN

## 2019-07-12 MED ORDER — LEVOTHYROXINE SODIUM 75 MCG PO TABS
150.0000 ug | ORAL_TABLET | Freq: Every day | ORAL | Status: DC
  Administered 2019-07-13: 150 ug via ORAL
  Filled 2019-07-12: qty 2

## 2019-07-12 MED ORDER — ONDANSETRON HCL 4 MG PO TABS: 4.0000 mg | ORAL_TABLET | Freq: Four times a day (QID) | ORAL | Status: DC | PRN

## 2019-07-12 NOTE — ED Triage Notes (Signed)
EMS reported PT lives in an independent living APT at [facility].. Pt reported he woke up at 0500 and at 0600 he had CP. EMS gave 324 ASA and 547ml NS IV . Pt reports he is ALL to ASA.

## 2019-07-12 NOTE — Telephone Encounter (Signed)
Received update from Dottie with [facility] that patient was sent to ED today due to reports of chest pain and tachycardia. Hospital liaison team updated.

## 2019-07-12 NOTE — ED Notes (Signed)
RN missed call from [facility], RN attempted to return call and phone rang as busy.

## 2019-07-12 NOTE — ED Notes (Signed)
First attempt to call report unsuccessful. 

## 2019-07-12 NOTE — Progress Notes (Signed)
  Echocardiogram 2D Echocardiogram has been performed.  [name] [name] [name] 07/12/2019, 1:58 PM

## 2019-07-12 NOTE — ED Provider Notes (Signed)
[facility] EMERGENCY DEPARTMENT Provider Note   CSN: [id number] Arrival date & time: 07/12/19  8119     History Chief Complaint  Patient presents with  . Chest Pain    [name] is a 86 y.o. male with a history of CAD S/p PCI,  T2DM, dyslipidemia, CKD, hypertension, hypothyroidism, paroxysmal atrial fibrillation on Eliquis, prior stroke, and parkisons disease who presents to the ED via EMS with complaints of chest pain that began @ 0600 and is resolved currently. Patient states he woke up feeling at baseline, got dressed with his morning routine and then developed chest discomfort to the left chest that felt like a pressure with associated dyspnea. Sxs lasted for a short period of time, unsure how much, 911 was called, and upon EMS arrival he states he felt back to baseline.  Per EMS to triage team patient was given 500 cc of normal saline and 324 mg of aspirin in route.  Currently patient has no complaints.  He is without chest pain or dyspnea at this time.  He denies lightheadedness, dizziness, syncope, diaphoresis, nausea, vomiting, unilateral leg pain/swelling.  He states that he has not missed any doses of his Eliquis except for this morning he has not had his morning meds.  HPI     Past Medical History:  Diagnosis Date  . Allergic rhinitis   . Arthritis    arthritis-kyphosis  . Asthmatic bronchitis   . CKD (chronic kidney disease) stage 3, GFR 30-59 ml/min   . DM type 2 (diabetes mellitus, type 2) ([place])   . DVT, lower extremity, proximal, acute ([place])   . Dyslipidemia   . GERD (gastroesophageal reflux disease)   . Hearing impaired    bilateral hearing aids  . History of kidney stones   . History of skin cancer   . Hypertension   . Hypothyroidism   . Macular degeneration    injections done bilaterally recent - 08-22-13  . PAF (paroxysmal atrial fibrillation) ([place])   . Prostate cancer [facility])    Prostate cancer-radiation only,skin cancer-basal cell and  last squamous cell right eye  . Stroke ([place])   . Urgency-frequency syndrome   . Urticaria     Patient Active Problem List   Diagnosis Date Noted  . Symptomatic anemia 11/13/2018  . Parkinson's disease ([place]) 11/13/2018  . AF (paroxysmal atrial fibrillation) ([place])   . Coronary artery disease involving native coronary artery of native heart without angina pectoris   . Hyperlipidemia LDL goal <70   . Urinary tract infection with hematuria   . Non-ST elevation (NSTEMI) myocardial infarction ([place])   . GERD (gastroesophageal reflux disease) 08/04/2017  . Elevated troponin 08/03/2017  . CKD (chronic kidney disease), stage III 08/03/2017  . Type II diabetes mellitus with renal manifestations ([place]) 08/03/2017  . Pseudophakia of both eyes 05/31/2017  . Retained ureteral stent 04/22/2016  . Ureteral calculus 01/25/2016  . Benign essential HTN 10/24/2015  . CVA (cerebral vascular accident) ([place]) 10/14/2014  . TIA (transient ischemic attack)   . CME (cystoid macular edema), left 08/10/2013  . Squamous cell carcinoma of eyelid 05/15/2013  . Blepharitis 03/16/2013  . Posterior vitreous detachment of right eye 10/23/2011  . Exudative age-related macular degeneration ([place]) 07/28/2011  . Nonproliferative diabetic retinopathy ([place]) 04/07/2011  . Insomnia, unspecified 05/19/2010  . ATAXIA 03/17/2010  . DIZZINESS 03/06/2010  . Chronic constipation 02/21/2008  . OSTEOARTHRITIS, KNEE, SEVERE 01/12/2008  . Hypothyroidism (post surgical) 10/15/2006  . Diabetes mellitus, insulin dependent (IDDM),  controlled 04/22/2006  . URTICARIA 04/22/2006  . POPLITEAL CYST 04/22/2006  . THYROIDECTOMY, SUBTOTAL, HX OF 04/22/2006  . TOTAL KNEE REPLACEMENT, HX OF 04/22/2006    Past Surgical History:  Procedure Laterality Date  . BACK SURGERY     lumbar fracture  . cataract surgery Bilateral   . COLONOSCOPY W/ POLYPECTOMY     x2   . CORONARY STENT INTERVENTION N/A 08/06/2017   Procedure: CORONARY STENT  INTERVENTION;  Surgeon: Jettie Booze, MD;  Location: [facility];  Service: Cardiovascular;  Laterality: N/A;  . CYSTOSCOPY W/ URETERAL STENT PLACEMENT Right 08/13/2017   Procedure: CYSTOSCOPY WITH RIGHT  RETROGRADE PYELOGRAM/URETERAL STENT PLACEMENT;  Surgeon: Raynelle Bring, MD;  Location: [facility];  Service: Urology;  Laterality: Right;  . CYSTOSCOPY WITH INJECTION N/A 09/11/2013   Procedure: CYSTOSCOPY WITH BOTOX INJECTION INTO THE BLADDER;  Surgeon: Ailene Rud, MD;  Location: [facility];  Service: Urology;  Laterality: N/A;  . CYSTOSCOPY WITH RETROGRADE PYELOGRAM, URETEROSCOPY AND STENT PLACEMENT    . CYSTOSCOPY WITH RETROGRADE PYELOGRAM, URETEROSCOPY AND STENT PLACEMENT Right 12/22/2018   Procedure: CYSTOSCOPY WITH RETROGRADE PYELOGRAM, URETEROSCOPY AND STENT PLACEMENT;  Surgeon: Cleon Gustin, MD;  Location: [facility];  Service: Urology;  Laterality: Right;  90 MINS  . CYSTOSCOPY WITH STENT PLACEMENT Right 01/26/2016   Procedure: CYSTOSCOPY, RIGHT RETROGRADE WITH RIGHT URETERAL STENT PLACEMENT;  Surgeon: Cleon Gustin, MD;  Location: [facility];  Service: Urology;  Laterality: Right;  . CYSTOSCOPY/RETROGRADE/URETEROSCOPY Right 04/22/2016   Procedure: CYSTOSCOPY/RETROGRADE/ REMOVAL JJ STENT right insertion stent right insertion of foley;  Surgeon: Franchot Gallo, MD;  Location: [facility];  Service: Urology;  Laterality: Right;  . EXTRACORPOREAL SHOCK WAVE LITHOTRIPSY Right 02/27/2016   Procedure: RIGHT EXTRACORPOREAL SHOCK WAVE LITHOTRIPSY (ESWL) GAITED;  Surgeon: Cleon Gustin, MD;  Location: [facility];  Service: Urology;  Laterality: Right;  . EXTRACORPOREAL SHOCK WAVE LITHOTRIPSY Right 02/10/2016   Procedure: EXTRACORPOREAL SHOCK WAVE LITHOTRIPSY (ESWL);  Surgeon: Kathie Rhodes, MD;  Location: [facility];  Service: Urology;  Laterality: Right;  [phone] [id number] [employer]BS- [id number]   . EYE SURGERY     macular degeneration  . GALLBLADDER  SURGERY  2006   no cholecystectomy  . HERNIA REPAIR    . HOLMIUM LASER APPLICATION Right 83/38/2505   Procedure: HOLMIUM LASER APPLICATION;  Surgeon: Cleon Gustin, MD;  Location: [facility];  Service: Urology;  Laterality: Right;  . LEFT HEART CATH AND CORONARY ANGIOGRAPHY N/A 08/06/2017   Procedure: LEFT HEART CATH AND CORONARY ANGIOGRAPHY;  Surgeon: Jettie Booze, MD;  Location: [facility];  Service: Cardiovascular;  Laterality: N/A;  . LUNG REMOVAL, PARTIAL  age 58   for bronchiectasis  . PARTIAL THYMECTOMY  1985   for nodules  . POPLITEAL SYNOVIAL CYST EXCISION  2005  . TOTAL KNEE ARTHROPLASTY  2008   right  . TOTAL KNEE ARTHROPLASTY  2010   left       Family History  Problem Relation Age of Onset  . Other Sister        ophth disease  . Heart attack Father        x7  . Heart disease Father        MI x7  . Hypertension Mother   . Stroke Mother 66  . Amblyopia Neg Hx   . Blindness Neg Hx   . Glaucoma Neg Hx   . Macular degeneration Neg Hx   . Retinal detachment Neg Hx   .  Strabismus Neg Hx   . Retinitis pigmentosa Neg Hx   . Cataracts Neg Hx     Social History   Tobacco Use  . Smoking status: Never Smoker  . Smokeless tobacco: Never Used  Substance Use Topics  . Alcohol use: No  . Drug use: No    Home Medications Prior to Admission medications   Medication Sig Start Date End Date Taking? Authorizing Provider  alfuzosin (UROXATRAL) 10 MG 24 hr tablet Take 1 tablet (10 mg total) by mouth at bedtime. 02/10/16   Kathie Rhodes, MD  Alogliptin Benzoate 12.5 MG TABS Take 12.5 mg by mouth.     [provider]  apixaban (ELIQUIS) 2.5 MG TABS tablet Take 2.5 mg by mouth 2 (two) times daily.    [provider]  benzonatate (TESSALON) 100 MG capsule Take 100 mg by mouth every 8 (eight) hours as needed for cough.     [provider]  brimonidine-timolol (COMBIGAN) 0.2-0.5 % ophthalmic solution Place 1 drop into both eyes 2 (two)  times daily.    [provider]  brinzolamide (AZOPT) 1 % ophthalmic suspension Place 1 drop into the right eye 3 (three) times daily.  09/03/16   [provider]  carbidopa-levodopa (SINEMET IR) 25-100 MG tablet Take 1 tablet by mouth 3 (three) times daily.     [provider]  Carboxymethylcellulose Sodium (ARTIFICIAL TEARS OP) Place 1 drop into both eyes 4 (four) times daily.    [provider]  Cholecalciferol (VITAMIN D-3) 5000 units TABS Take 5,000 Units by mouth daily.    [provider]  clopidogrel (PLAVIX) 75 MG tablet Take 75 mg by mouth daily.     [provider]  colesevelam (WELCHOL) 625 MG tablet Take 1,875 mg by mouth every evening.     [provider]  diltiazem (CARDIZEM CD) 120 MG 24 hr capsule Take 1 capsule (120 mg total) by mouth daily for 30 days. 03/12/18   Elodia Florence., MD  docusate sodium (COLACE) 100 MG capsule Take 100 mg by mouth 2 (two) times daily.     [provider]  ferrous sulfate 325 (65 FE) MG EC tablet Take 1 tablet (325 mg total) by mouth 2 (two) times daily with a meal. 11/14/18 11/14/19  Danford, Suann Larry, MD  fluticasone (FLONASE) 50 MCG/ACT nasal spray Place 2 sprays into both nostrils daily.    [provider]  furosemide (LASIX) 20 MG tablet Take 20 mg by mouth daily.    [provider]  latanoprost (XALATAN) 0.005 % ophthalmic solution Place 1 drop into the right eye at bedtime.  07/28/16   [provider]  levocetirizine (XYZAL) 5 MG tablet Take 2.5 mg by mouth at bedtime.  04/15/18   [provider]  levothyroxine (SYNTHROID) 150 MCG tablet Take 150 mcg by mouth daily.     [provider]  Melatonin 3 MG SUBL Place 3 mg under the tongue at bedtime. 11/11/18   [provider]  Menthol, Topical Analgesic, (ZIMS MAX-FREEZE EX) Apply 1 application topically every 8 (eight) hours as needed (pain). Apply to ankles    [provider]  menthol-cetylpyridinium (CEPACOL) 3 MG lozenge Take 1 lozenge by mouth every 4 (four) hours as needed for sore throat.     [provider]  Meth-Hyo-M Bl-Na Phos-Ph Sal (URO-MP) 118 MG CAPS Take 1 capsule by mouth 3 (three) times daily.    [provider]  metoprolol succinate (TOPROL-XL) 25  MG 24 hr tablet Take 0.5 tablets (12.5 mg total) by mouth daily for 30 days. 03/12/18   Elodia Florence., MD  mirabegron ER (MYRBETRIQ) 50 MG TB24 tablet Take 1 tablet (50 mg total) by mouth daily. 01/28/16   McKenzie, Candee Furbish, MD  Multiple Vitamins-Minerals (PRESERVISION/LUTEIN) CAPS Take 1 capsule by mouth 2 (two) times daily.    [provider]  polyethylene glycol (MIRALAX / GLYCOLAX) packet Take 17 g by mouth daily. [place]    [provider]  potassium chloride (K-DUR,KLOR-CON) 10 MEQ tablet Take 10 mEq by mouth daily. Dissolve in applesauce    [provider]  sertraline (ZOLOFT) 25 MG tablet Take 25 mg by mouth daily.    [provider]  traMADol (ULTRAM) 50 MG tablet Take 1 tablet (50 mg total) by mouth every 6 (six) hours as needed. 12/22/18 12/22/19  Cleon Gustin, MD    Allergies    Colchicine, Hibiclens [chlorhexidine], Atorvastatin, Ceftin, Celebrex [celecoxib], Codeine, Erythromycin, Ezetimibe, Iodinated diagnostic agents, Oxycodone-acetaminophen, Rosuvastatin, Simvastatin, Cefpodoxime, Cefuroxime axetil, Nitroglycerin, Tetanus toxoid, and Vantin  Review of Systems   Review of Systems  Constitutional: Negative for chills, diaphoresis and fever.  Eyes: Negative for visual disturbance.  Respiratory: Positive for shortness of breath.   Cardiovascular: Positive for chest pain.  Gastrointestinal: Negative for abdominal pain, nausea and vomiting.  Neurological: Negative for dizziness, syncope and light-headedness.  All other systems reviewed and are negative.   Physical Exam Updated Vital Signs BP 108/85    Pulse (!) 144   Temp 98.2 F (36.8 C) (Oral)   Resp (!) 21   SpO2 98%   Physical Exam Vitals and nursing note reviewed.  Constitutional:      General: He is not in acute distress.    Appearance: He is well-developed. He is not toxic-appearing.  HENT:     Head: Normocephalic and atraumatic.  Eyes:     General:        Right eye: No discharge.        Left eye: No discharge.     Conjunctiva/sclera: Conjunctivae normal.  Cardiovascular:     Rate and Rhythm: Tachycardia present.     Pulses:          Radial pulses are 2+ on the right side and 2+ on the left side.     Comments: Irregularly irregular. Pulmonary:     Effort: Pulmonary effort is normal. No respiratory distress.     Breath sounds: Normal breath sounds. No wheezing, rhonchi or rales.  Abdominal:     General: There is no distension.     Palpations: Abdomen is soft.     Tenderness: There is no abdominal tenderness.  Musculoskeletal:     Cervical back: Neck supple.     Comments: Trace symmetric pitting edema to the bilateral lower legs.  No overlying erythema or warmth.  Skin:    General: Skin is warm and dry.     Findings: No rash.  Neurological:     Mental Status: He is alert.     Comments: Clear speech.   Psychiatric:        Behavior: Behavior normal.     ED Results / Procedures / Treatments   Labs (all labs ordered are listed, but only abnormal results are displayed) Labs Reviewed  BASIC METABOLIC PANEL - Abnormal; Notable for the following components:      Result Value   Glucose, Bld 111 (*)    BUN 29 (*)  Creatinine, Ser 1.33 (*)    Calcium 8.8 (*)    GFR calc non Af Amer 48 (*)    GFR calc Af Amer 56 (*)    All other components within normal limits  CBC - Abnormal; Notable for the following components:   RBC 4.08 (*)    Hemoglobin 12.7 (*)    All other components within normal limits  TROPONIN I (HIGH SENSITIVITY) - Abnormal; Notable for the following components:   Troponin I (High  Sensitivity) 31 (*)    All other components within normal limits  SARS CORONAVIRUS 2 BY RT PCR (HOSPITAL ORDER, [facility])  MAGNESIUM  TROPONIN I (HIGH SENSITIVITY)    EKG EKG Interpretation  Date/Time:  Wednesday July 12 2019 08:59:02 EDT Ventricular Rate:  142 PR Interval:    QRS Duration: 83 QT Interval:  319 QTC Calculation: 491 R Axis:   68 Text Interpretation: Atrial fibrillation with rapid V-rate Left ventricular hypertrophy Anterior infarct, old Confirmed by Davonna Belling [phone]) on 07/12/2019 9:19:11 AM   Radiology DG Chest Port 1 View  Result Date: 07/12/2019 CLINICAL DATA:  Chest pain EXAM: PORTABLE CHEST 1 VIEW COMPARISON:  November 13, 2018 FINDINGS: Lungs are clear. Heart size and pulmonary vascularity are normal. Moderate hiatal type hernia present. No adenopathy. There is aortic atherosclerosis. There is calcification in each carotid artery. No bone lesions. IMPRESSION: Lungs clear.  Stable cardiac silhouette.  Hiatal hernia present. Aortic Atherosclerosis (ICD10-I70.0). There are also foci of carotid artery calcification bilaterally. Electronically Signed   By: Lowella Grip III M.D.   On: 07/12/2019 09:52    Procedures .Critical Care Performed by: Amaryllis Dyke, PA-C Authorized by: Amaryllis Dyke, PA-C    CRITICAL CARE Performed by: Kennith Maes   Total critical care time: 50 minutes  Critical care time was exclusive of separately billable procedures and treating other patients.  Critical care was necessary to treat or prevent imminent or life-threatening deterioration.  Critical care was time spent personally by me on the following activities: development of treatment plan with patient and/or surrogate as well as nursing, discussions with consultants, evaluation of patient's response to treatment, examination of patient, obtaining history from patient or surrogate, ordering and performing treatments  and interventions, ordering and review of laboratory studies, ordering and review of radiographic studies, pulse oximetry and re-evaluation of patient's condition.    (including critical care time)  Medications Ordered in ED Medications  diltiazem (CARDIZEM) 1 mg/mL load via infusion 10 mg (10 mg Intravenous Bolus from Bag 07/12/19 1031)    And  diltiazem (CARDIZEM) 125 mg in dextrose 5% 125 mL (1 mg/mL) infusion (10 mg/hr Intravenous Rate/Dose Change 07/12/19 1132)    ED Course  I have reviewed the triage vital signs and the nursing notes.  Pertinent labs & imaging results that were available during my care of the patient were reviewed by me and considered in my medical decision making (see chart for details).    AZEEM POORMAN was evaluated in Emergency Department on 07/12/2019 for the symptoms described in the history of present illness. He/she was evaluated in the context of the global COVID-19 pandemic, which necessitated consideration that the patient might be at risk for infection with the SARS-CoV-2 virus that causes COVID-19. Institutional protocols and algorithms that pertain to the evaluation of patients at risk for COVID-19 are in a state of rapid change based on information released by regulatory bodies including the CDC and federal and state organizations.  These policies and algorithms were followed during the patient's care in the ED.  MDM Rules/Calculators/A&P                         Patient presents to the ED with complaints of chest pain that is resolved currently. On arrival he is in afib with RVR vitals otherwise fairly unremarkable.  Ddx: symptomatic afib, ACS, PE, dissection, CHF, pneumothorax, anemia, electrolyte derangement.   Additional history obtained:  Additional history obtained from EMS & nursing note review. Previous records obtained and reviewed.   ED Course:  Following initial assessment will start Cardizem bolus and drip.  We will continue to monitor pending  labs, electric cardioversion if no improvement given patient is fully anticoagulated and denies missing any doses recently.   Lab Tests:  I Ordered, reviewed, and interpreted labs, which included:  CBC: Anemia improved from prior BMP: Mildly hypocalcemic, no significant electrolyte derangements.  Renal function improved from prior. Troponin: Mild elevation at 31  Imaging Studies ordered:  I ordered imaging studies which included CXR, I independently visualized and interpreted imaging which showed Lungs clear.  Stable cardiac silhouette.  Hiatal hernia present. Aortic Atherosclerosis (ICD10-I70.0). There are also foci of carotid artery calcification bilaterally.  10:30: Remains in afib with RVR, RN titrating drip  11:31: Patient with conversion to normal sinus rhythm, repeat EKG confirms.  Will decrease Cardizem drip to 10 mg/hr from 15 mg/hr.   Plan for admission at this time.   Findings and plan of care discussed with supervising physician Dr. Alvino Chapel who is in agreement.   12:13: CONSULT: Discussed case with hospitalist Dr. Doristine Bosworth- accepts admission.   Portions of this note were generated with [employer]. Dictation errors may occur despite best attempts at proofreading.  Final Clinical Impression(s) / ED Diagnoses Final diagnoses:  Atrial fibrillation with rapid ventricular response ([place])  Chest pain, unspecified type    Rx / DC Orders ED Discharge Orders    None       Amaryllis Dyke, PA-C 07/12/19 1217    Davonna Belling, MD 07/12/19 1421

## 2019-07-12 NOTE — H&P (Signed)
History and Physical    [name] NID:[id number] DOB: Nov 16, 1931 DOA: 07/12/2019  PCP: Patient, No Pcp Per  Patient coming from: [facility]  I have personally briefly reviewed patient's old medical records in [place]  Chief Complaint: Chest pain  HPI: [name] is a 86 y.o. male with medical history significant of paroxysmal A. fib-on Eliquis, coronary artery disease status post PCI, CVA, Parkinson disease, hypertension, hypothyroidism, GERD, normocytic anemia, depression/anxiety, hyperlipidemia, CKD stage III brought by EMS to emergency department for the evaluation of chest pain.  Patient tells me that his chest pain started this morning at 6:00.  Reports left-sided pressure-like pain associated with mild dyspnea.  EMS was called was given 500 cc of normal saline with 324 mg of aspirin in route to ED.  Upon arrival to ED: Patient was noted to be in A. fib with RVR.  Started on Cardizem drip.  Patient's symptoms improved.  Upon my evaluation: Patient resting comfortably on the bed.  Reports that his chest pain and shortness of breath has resolved and he is back to baseline.  He denies headache, blurry vision, lightheadedness, dizziness, palpitation, leg swelling, nausea, vomiting, fever, chills, has chronic cough however denies worsening or sputum production, chest congestion, wheezing, abdominal pain, urinary or bowel changes.  No history of smoking, alcohol, listed drug use.  ED Course: Upon arrival to ED: Patient heart rate noted to be in 160s, tachypneic, blood pressure: Labile.  Initial troponin: 31, afebrile with no leukocytosis, magnesium, COVID-19 pending.  Chest x-ray shows hiatal hernia, foci of carotid artery calcification bilaterally.  No pneumonia.  Patient started on IV Cardizem drip.  Patient's back to normal sinus rhythm.  [facility] hospitalist consulted for admission for A. fib with RVR.    Review of Systems: As per HPI otherwise negative.    Past  Medical History:  Diagnosis Date  . Allergic rhinitis   . Arthritis    arthritis-kyphosis  . Asthmatic bronchitis   . CKD (chronic kidney disease) stage 3, GFR 30-59 ml/min   . DM type 2 (diabetes mellitus, type 2) ([place])   . DVT, lower extremity, proximal, acute ([place])   . Dyslipidemia   . GERD (gastroesophageal reflux disease)   . Hearing impaired    bilateral hearing aids  . History of kidney stones   . History of skin cancer   . Hypertension   . Hypothyroidism   . Macular degeneration    injections done bilaterally recent - 08-22-13  . PAF (paroxysmal atrial fibrillation) ([place])   . Prostate cancer [facility])    Prostate cancer-radiation only,skin cancer-basal cell and last squamous cell right eye  . Stroke ([place])   . Urgency-frequency syndrome   . Urticaria     Past Surgical History:  Procedure Laterality Date  . BACK SURGERY     lumbar fracture  . cataract surgery Bilateral   . COLONOSCOPY W/ POLYPECTOMY     x2   . CORONARY STENT INTERVENTION N/A 08/06/2017   Procedure: CORONARY STENT INTERVENTION;  Surgeon: Jettie Booze, MD;  Location: [place];  Service: Cardiovascular;  Laterality: N/A;  . CYSTOSCOPY W/ URETERAL STENT PLACEMENT Right 08/13/2017   Procedure: CYSTOSCOPY WITH RIGHT  RETROGRADE PYELOGRAM/URETERAL STENT PLACEMENT;  Surgeon: Raynelle Bring, MD;  Location: [facility];  Service: Urology;  Laterality: Right;  . CYSTOSCOPY WITH INJECTION N/A 09/11/2013   Procedure: CYSTOSCOPY WITH BOTOX INJECTION INTO THE BLADDER;  Surgeon: Ailene Rud, MD;  Location: [facility];  Service:  Urology;  Laterality: N/A;  . CYSTOSCOPY WITH RETROGRADE PYELOGRAM, URETEROSCOPY AND STENT PLACEMENT    . CYSTOSCOPY WITH RETROGRADE PYELOGRAM, URETEROSCOPY AND STENT PLACEMENT Right 12/22/2018   Procedure: CYSTOSCOPY WITH RETROGRADE PYELOGRAM, URETEROSCOPY AND STENT PLACEMENT;  Surgeon: Cleon Gustin, MD;  Location: [facility];  Service: Urology;  Laterality: Right;  90 MINS  .  CYSTOSCOPY WITH STENT PLACEMENT Right 01/26/2016   Procedure: CYSTOSCOPY, RIGHT RETROGRADE WITH RIGHT URETERAL STENT PLACEMENT;  Surgeon: Cleon Gustin, MD;  Location: [facility];  Service: Urology;  Laterality: Right;  . CYSTOSCOPY/RETROGRADE/URETEROSCOPY Right 04/22/2016   Procedure: CYSTOSCOPY/RETROGRADE/ REMOVAL JJ STENT right insertion stent right insertion of foley;  Surgeon: Franchot Gallo, MD;  Location: [facility];  Service: Urology;  Laterality: Right;  . EXTRACORPOREAL SHOCK WAVE LITHOTRIPSY Right 02/27/2016   Procedure: RIGHT EXTRACORPOREAL SHOCK WAVE LITHOTRIPSY (ESWL) GAITED;  Surgeon: Cleon Gustin, MD;  Location: [facility];  Service: Urology;  Laterality: Right;  . EXTRACORPOREAL SHOCK WAVE LITHOTRIPSY Right 02/10/2016   Procedure: EXTRACORPOREAL SHOCK WAVE LITHOTRIPSY (ESWL);  Surgeon: Kathie Rhodes, MD;  Location: [facility];  Service: Urology;  Laterality: Right;  [facility]-[phone] [id number] [employer]- [id number]   . EYE SURGERY     macular degeneration  . GALLBLADDER SURGERY  2006   no cholecystectomy  . HERNIA REPAIR    . HOLMIUM LASER APPLICATION Right 85/46/2703   Procedure: HOLMIUM LASER APPLICATION;  Surgeon: Cleon Gustin, MD;  Location: [facility];  Service: Urology;  Laterality: Right;  . LEFT HEART CATH AND CORONARY ANGIOGRAPHY N/A 08/06/2017   Procedure: LEFT HEART CATH AND CORONARY ANGIOGRAPHY;  Surgeon: Jettie Booze, MD;  Location: [facility];  Service: Cardiovascular;  Laterality: N/A;  . LUNG REMOVAL, PARTIAL  age 22   for bronchiectasis  . PARTIAL THYMECTOMY  1985   for nodules  . POPLITEAL SYNOVIAL CYST EXCISION  2005  . TOTAL KNEE ARTHROPLASTY  2008   right  . TOTAL KNEE ARTHROPLASTY  2010   left     reports that he has never smoked. He has never used smokeless tobacco. He reports that he does not drink alcohol and does not use drugs.  Allergies  Allergen Reactions  . Colchicine Anaphylaxis  . Hibiclens  [Chlorhexidine] Hives  . Atorvastatin Other (See Comments)    Joint pain   . Ceftin Diarrhea  . Celebrex [Celecoxib] Swelling  . Codeine Hives  . Erythromycin Diarrhea  . Ezetimibe Other (See Comments)    Joint pain   . Iodinated Diagnostic Agents Nausea And Vomiting and Rash  . Oxycodone-Acetaminophen Hives  . Rosuvastatin Other (See Comments)    Joint pain   . Simvastatin Other (See Comments)    Joint pain   . Cefpodoxime Rash  . Cefuroxime Axetil Rash  . Nitroglycerin Other (See Comments)    Lowers patient's B/P  . Tetanus Toxoid Rash  . Vantin Other (See Comments)    Unknown/not noted on MAR??    Family History  Problem Relation Age of Onset  . Other Sister        ophth disease  . Heart attack Father        x7  . Heart disease Father        MI x7  . Hypertension Mother   . Stroke Mother 75  . Amblyopia Neg Hx   . Blindness Neg Hx   . Glaucoma Neg Hx   . Macular degeneration Neg Hx   . Retinal detachment Neg Hx   .  Strabismus Neg Hx   . Retinitis pigmentosa Neg Hx   . Cataracts Neg Hx     Prior to Admission medications   Medication Sig Start Date End Date Taking? Authorizing Provider  alfuzosin (UROXATRAL) 10 MG 24 hr tablet Take 1 tablet (10 mg total) by mouth at bedtime. 02/10/16  Yes Kathie Rhodes, MD  Alogliptin Benzoate 12.5 MG TABS Take 12.5 mg by mouth.    Yes [provider]  apixaban (ELIQUIS) 2.5 MG TABS tablet Take 2.5 mg by mouth 2 (two) times daily.   Yes [provider]  benzonatate (TESSALON) 100 MG capsule Take 100 mg by mouth every 8 (eight) hours as needed for cough.    Yes [provider]  brimonidine-timolol (COMBIGAN) 0.2-0.5 % ophthalmic solution Place 1 drop into both eyes 2 (two) times daily.   Yes [provider]  brinzolamide (AZOPT) 1 % ophthalmic suspension Place 1 drop into the right eye 3 (three) times daily.  09/03/16  Yes [provider]  carbidopa-levodopa (SINEMET IR) 25-100 MG tablet  Take 1 tablet by mouth 3 (three) times daily.    Yes [provider]  Carboxymethylcellulose Sodium (ARTIFICIAL TEARS OP) Place 1 drop into both eyes 4 (four) times daily.   Yes [provider]  Cholecalciferol (VITAMIN D-3) 5000 units TABS Take 5,000 Units by mouth daily.   Yes [provider]  clopidogrel (PLAVIX) 75 MG tablet Take 75 mg by mouth daily.    Yes [provider]  colesevelam (WELCHOL) 625 MG tablet Take 1,875 mg by mouth every evening.    Yes [provider]  Cyanocobalamin (VITAMIN B-12) 2500 MCG SUBL Place 1 tablet under the tongue daily.   Yes [provider]  diltiazem (CARDIZEM CD) 120 MG 24 hr capsule Take 1 capsule (120 mg total) by mouth daily for 30 days. 03/12/18  Yes Elodia Florence., MD  docusate sodium (COLACE) 100 MG capsule Take 100 mg by mouth 2 (two) times daily.    Yes [provider]  ferrous sulfate 325 (65 FE) MG EC tablet Take 1 tablet (325 mg total) by mouth 2 (two) times daily with a meal. 11/14/18 11/14/19 Yes Danford, Suann Larry, MD  fluticasone (FLONASE) 50 MCG/ACT nasal spray Place 2 sprays into both nostrils daily.   Yes [provider]  furosemide (LASIX) 20 MG tablet Take 20 mg by mouth daily.   Yes [provider]  latanoprost (XALATAN) 0.005 % ophthalmic solution Place 1 drop into the right eye at bedtime.  07/28/16  Yes [provider]  levocetirizine (XYZAL) 5 MG tablet Take 2.5 mg by mouth at bedtime.  04/15/18  Yes [provider]  levothyroxine (SYNTHROID) 150 MCG tablet Take 150 mcg by mouth daily.    Yes [provider]  Melatonin 3 MG SUBL Place 3 mg under the tongue at bedtime. 11/11/18  Yes [provider]  Menthol, Topical Analgesic, (ZIMS MAX-FREEZE EX) Apply 1 application topically every 8 (eight) hours as needed (pain). Apply to ankles   Yes [provider]  menthol-cetylpyridinium (CEPACOL) 3 MG lozenge Take 1  lozenge by mouth every 4 (four) hours as needed for sore throat.    Yes [provider]  Meth-Hyo-M Bl-Na Phos-Ph Sal (URO-MP) 118 MG CAPS Take 1 capsule by mouth 3 (three) times daily.   Yes [provider]  metoprolol succinate (TOPROL-XL) 25 MG 24 hr tablet Take 0.5 tablets (12.5 mg total) by mouth daily for 30 days. 03/12/18  Yes Elodia Florence., MD  mirabegron ER (MYRBETRIQ) 50 MG TB24 tablet Take 1 tablet (50 mg total) by mouth daily. 01/28/16  Yes McKenzie, Candee Furbish, MD  Multiple Vitamins-Minerals (PRESERVISION/LUTEIN) CAPS Take 1 capsule by mouth 2 (two) times daily.   Yes [provider]  pantoprazole (PROTONIX) 40 MG tablet Take 40 mg by mouth daily. 06/29/19  Yes [provider]  polyethylene glycol (MIRALAX / GLYCOLAX) packet Take 17 g by mouth daily. Peridot   Yes [provider]  potassium chloride (K-DUR,KLOR-CON) 10 MEQ tablet Take 10 mEq by mouth daily. Dissolve in applesauce   Yes [provider]  sertraline (ZOLOFT) 25 MG tablet Take 25 mg by mouth daily.   Yes [provider]  traMADol (ULTRAM) 50 MG tablet Take 1 tablet (50 mg total) by mouth every 6 (six) hours as needed. Patient taking differently: Take 50 mg by mouth every 6 (six) hours as needed for moderate pain.  12/22/18 12/22/19 Yes McKenzieCandee Furbish, MD    Physical Exam: Vitals:   07/12/19 1110 07/12/19 1118 07/12/19 1126 07/12/19 1130  BP: 96/68 93/72 95/76  121/74  Pulse: 89 (!) 48 77 68  Resp: 20 (!) 28 20 14   Temp:      TempSrc:      SpO2: 98% 98% 97% 97%    Constitutional: NAD, calm, comfortable, on room air, communicating well, alert and oriented and following commands Eyes: PERRL, lids and conjunctivae normal ENMT: Mucous membranes are moist. Posterior pharynx clear of any exudate or lesions.Normal dentition.  Neck: normal, supple, no masses, no thyromegaly Respiratory: clear to auscultation bilaterally, no wheezing, no  crackles. Normal respiratory effort. No accessory muscle use.  Cardiovascular: Regular rate and rhythm, no murmurs / rubs / gallops. No extremity edema. 2+ pedal pulses. No carotid bruits.  Abdomen: no tenderness, no masses palpated. No hepatosplenomegaly. Bowel sounds positive.  Musculoskeletal: no clubbing / cyanosis. No joint deformity upper and lower extremities. Good ROM, no contractures. Normal muscle tone.  Skin: no rashes, lesions, ulcers. No induration Neurologic: CN 2-12 grossly intact. Sensation intact, DTR normal. Strength 5/5 in all 4.  Psychiatric: Normal judgment and insight. Alert and oriented x 3. Normal mood.    Labs on Admission: I have personally reviewed following labs and imaging studies  CBC: Recent Labs  Lab 07/12/19 1044  WBC 7.0  HGB 12.7*  HCT 39.2  MCV 96.1  PLT 671   Basic Metabolic Panel: Recent Labs  Lab 07/12/19 1044  NA 139  K 4.3  CL 105  CO2 24  GLUCOSE 111*  BUN 29*  CREATININE 1.33*  CALCIUM 8.8*   GFR: CrCl cannot be calculated (Unknown ideal weight.). Liver Function Tests: No results for input(s): AST, ALT, ALKPHOS, BILITOT, PROT, ALBUMIN in the last 168 hours. No results for input(s): LIPASE, AMYLASE in the last 168 hours. No results for input(s): AMMONIA in the last 168 hours. Coagulation Profile: No results for input(s): INR, PROTIME in the last 168 hours. Cardiac Enzymes: No results for input(s): CKTOTAL, CKMB, CKMBINDEX, TROPONINI in the last 168 hours. BNP (last 3 results) No results for input(s): PROBNP in the last 8760 hours. HbA1C: No results for input(s): HGBA1C in the last 72 hours. CBG: No results for input(s): GLUCAP in the last 168 hours. Lipid Profile: No results for input(s): CHOL, HDL, LDLCALC, TRIG, CHOLHDL, LDLDIRECT in the last 72 hours. Thyroid Function Tests: No results for input(s): TSH, T4TOTAL, FREET4, T3FREE, THYROIDAB in the last 72 hours.  Anemia Panel: No results for input(s): VITAMINB12, FOLATE,  FERRITIN, TIBC, IRON, RETICCTPCT in the last 72 hours. Urine analysis:    Component Value Date/Time   COLORURINE YELLOW 11/14/2018 1040   APPEARANCEUR CLOUDY (A) 11/14/2018 1040   LABSPEC 1.012 11/14/2018 1040   PHURINE 6.0 11/14/2018 1040   GLUCOSEU NEGATIVE 11/14/2018 1040   HGBUR LARGE (A) 11/14/2018 1040   BILIRUBINUR NEGATIVE 11/14/2018 1040   KETONESUR NEGATIVE 11/14/2018 1040   PROTEINUR 100 (A) 11/14/2018 1040   UROBILINOGEN 0.2 05/26/2014 2040   NITRITE NEGATIVE 11/14/2018 1040   LEUKOCYTESUR LARGE (A) 11/14/2018 1040    Radiological Exams on Admission: DG Chest Port 1 View  Result Date: 07/12/2019 CLINICAL DATA:  Chest pain EXAM: PORTABLE CHEST 1 VIEW COMPARISON:  November 13, 2018 FINDINGS: Lungs are clear. Heart size and pulmonary vascularity are normal. Moderate hiatal type hernia present. No adenopathy. There is aortic atherosclerosis. There is calcification in each carotid artery. No bone lesions. IMPRESSION: Lungs clear.  Stable cardiac silhouette.  Hiatal hernia present. Aortic Atherosclerosis (ICD10-I70.0). There are also foci of carotid artery calcification bilaterally. Electronically Signed   By: Lowella Grip III M.D.   On: 07/12/2019 09:52    EKG: Independently reviewed.  A. fib with RVR.  142 bpm.  LVH.  No ST elevation or depression noted.  Assessment/Plan Principal Problem:   Atrial fibrillation with RVR ([facility]) Active Problems:   Hypothyroidism (post surgical)   Benign essential HTN   Elevated troponin   CKD (chronic kidney disease), stage III   Type II diabetes mellitus with renal manifestations ([facility])   GERD (gastroesophageal reflux disease)   Hyperlipidemia LDL goal <70   A. fib with RVR: -Patient presented with chest pain and shortness of breath.  Heart rate noted to be in 160s.  Reviewed EKG.  Patient started on Cardizem drip in ED.  Patient back to in sinus rhythm. -Admit patient to stepdown unit for close monitoring. -On telemetry. -Monitor  heart rate closely.  Initial troponin: 31.  Trend troponin.-Magnesium and COVID-19: Negative.  Chest x-ray negative for infection. -Patient is afebrile with no leukocytosis. -Cardizem as needed -Check TSH.  Reviewed echo from 03/11/2018 which showed preserved ejection fraction.  We will repeat echo. -Continue Eliquis  Hypertension: Blood pressure is on lower side currently.  Will hold p.o. Cardizem and metoprolol.  Monitor blood pressure closely.  Resume metoprolol once patient's blood pressure is back to baseline.  CKD stage IIIa: Stable -Continue to monitor.  Depression/anxiety: Continue Zoloft  GERD: Continue PPI  Hypothyroidism: Check TSH -Continue levothyroxine  Parkinson disease: At baseline -Continue Sinemet  Coronary artery disease status post PCI: Stable -Continue Plavix.  Hold metoprolol due to decreased blood pressure. -Patient denies ACS symptoms currently.  Trend troponin.  Overactive bladder: Continue Myrbetriq  Lower extremity edema: Continue Lasix 20 mg  Hiatal hernia: Noted on chest x-ray.  Patient denies any symptoms. -Continue PPI.  DVT prophylaxis: Eliquis/SCD Code Status: DNR-confirmed with the patient Family Communication: None present at bedside.  Plan of care discussed with patient in length and he verbalized understanding and agreed with it. Disposition Plan: Likely home in 1 to 2 days Consults called: None Admission status: Inpatient   Mckinley Jewel MD [facility]  If 7PM-7AM, please contact night-coverage [email or web address] Password [email or web address]  07/12/2019, 12:15 PM

## 2019-07-13 DIAGNOSIS — I480 Paroxysmal atrial fibrillation: Secondary | ICD-10-CM | POA: Diagnosis not present

## 2019-07-13 DIAGNOSIS — I4891 Unspecified atrial fibrillation: Secondary | ICD-10-CM | POA: Diagnosis not present

## 2019-07-13 LAB — COMPREHENSIVE METABOLIC PANEL
ALT: 5 U/L (ref 0–44)
AST: 9 U/L — ABNORMAL LOW (ref 15–41)
Albumin: 3.3 g/dL — ABNORMAL LOW (ref 3.5–5.0)
Alkaline Phosphatase: 68 U/L (ref 38–126)
Anion gap: 8 (ref 5–15)
BUN: 26 mg/dL — ABNORMAL HIGH (ref 8–23)
CO2: 27 mmol/L (ref 22–32)
Calcium: 8.8 mg/dL — ABNORMAL LOW (ref 8.9–10.3)
Chloride: 104 mmol/L (ref 98–111)
Creatinine, Ser: 1.44 mg/dL — ABNORMAL HIGH (ref 0.61–1.24)
GFR calc Af Amer: 51 mL/min — ABNORMAL LOW (ref >60)
GFR calc non Af Amer: 44 mL/min — ABNORMAL LOW (ref >60)
Glucose, Bld: 115 mg/dL — ABNORMAL HIGH (ref 70–99)
Potassium: 3.7 mmol/L (ref 3.5–5.1)
Sodium: 139 mmol/L (ref 135–145)
Total Bilirubin: 0.7 mg/dL (ref 0.3–1.2)
Total Protein: 5.8 g/dL — ABNORMAL LOW (ref 6.5–8.1)

## 2019-07-13 LAB — CBC
HCT: 38.7 % — ABNORMAL LOW (ref 39.0–52.0)
Hemoglobin: 12.5 g/dL — ABNORMAL LOW (ref 13.0–17.0)
MCH: 30.6 pg (ref 26.0–34.0)
MCHC: 32.3 g/dL (ref 30.0–36.0)
MCV: 94.6 fL (ref 80.0–100.0)
Platelets: 189 10*3/uL (ref 150–400)
RBC: 4.09 MIL/uL — ABNORMAL LOW (ref 4.22–5.81)
RDW: 11.9 % (ref 11.5–15.5)
WBC: 6.5 10*3/uL (ref 4.0–10.5)
nRBC: 0 % (ref 0.0–0.2)

## 2019-07-13 MED ORDER — METOPROLOL SUCCINATE ER 25 MG PO TB24
12.5000 mg | ORAL_TABLET | Freq: Every day | ORAL | Status: DC
  Administered 2019-07-13: 12.5 mg via ORAL
  Filled 2019-07-13: qty 1

## 2019-07-13 MED ORDER — DILTIAZEM HCL ER COATED BEADS 120 MG PO CP24
120.0000 mg | ORAL_CAPSULE | Freq: Every day | ORAL | Status: DC
  Administered 2019-07-13: 120 mg via ORAL
  Filled 2019-07-13: qty 1

## 2019-07-13 MED ORDER — FLUTICASONE PROPIONATE 50 MCG/ACT NA SUSP
2.0000 | Freq: Every day | NASAL | Status: DC
  Filled 2019-07-13: qty 16

## 2019-07-13 MED ORDER — LORATADINE 10 MG PO TABS
10.0000 mg | ORAL_TABLET | Freq: Every day | ORAL | Status: DC
  Filled 2019-07-13: qty 1

## 2019-07-13 NOTE — Discharge Summary (Signed)
Physician Discharge Summary  Davide Risdon Kressin OXB:[id number] DOB: November 23, 1931 DOA: 07/12/2019  PCP: Patient, No Pcp Per  Admit date: 07/12/2019 Discharge date: 07/13/2019  Admitted From: Assisted living facility Disposition: ALF  Recommendations for Outpatient Follow-up:  1. Follow up with PCP in 1-2 weeks 2. Please obtain BMP/CBC in one week 3. Please follow up with your PCP on the following pending results: Unresulted Labs (From admission, onward) Comment         None       Home Health: Yes Equipment/Devices: None  Discharge Condition: Stable CODE STATUS: DNR Diet recommendation: Cardiac/low-sodium  Subjective: Seen and examined.  He has no complaints.  HPI: STALEY LUNZ is a 86 y.o. male with medical history significant of paroxysmal A. fib-on Eliquis, coronary artery disease status post PCI, CVA, Parkinson disease, hypertension, hypothyroidism, GERD, normocytic anemia, depression/anxiety, hyperlipidemia, CKD stage III brought by EMS to emergency department for the evaluation of chest pain.  Patient tells me that his chest pain started this morning at 6:00.  Reports left-sided pressure-like pain associated with mild dyspnea.  EMS was called was given 500 cc of normal saline with 324 mg of aspirin in route to ED.  Upon arrival to ED: Patient was noted to be in A. fib with RVR.  Started on Cardizem drip.  Patient's symptoms improved.  Upon my evaluation: Patient resting comfortably on the bed.  Reports that his chest pain and shortness of breath has resolved and he is back to baseline.  He denies headache, blurry vision, lightheadedness, dizziness, palpitation, leg swelling, nausea, vomiting, fever, chills, has chronic cough however denies worsening or sputum production, chest congestion, wheezing, abdominal pain, urinary or bowel changes.  No history of smoking, alcohol, listed drug use.  ED Course: Upon arrival to ED: Patient heart rate noted to be in 160s, tachypneic,  blood pressure: Labile.  Initial troponin: 31, afebrile with no leukocytosis, magnesium, COVID-19 pending.  Chest x-ray shows hiatal hernia, foci of carotid artery calcification bilaterally.  No pneumonia.  Patient started on IV Cardizem drip.  Patient's back to normal sinus rhythm.  [facility] hospital[facility] consulted for admission for A. fib with RVR.   Brief/Interim Summary: Patient was admitted under hospitalist service for chest pain and atrial fibrillation with RVR.  Patient converted back to sinus rhythm in the emergency department after he was given IV Cardizem.  Serial cardiac enzymes were obtained which were slightly elevated but flat.  Transthoracic echo did not show any wall motion normal.  Patient remained in sinus rhythm today when I seen him.  We stopped his Cardizem drip and resumed his oral Cardizem and beta-blocker.  Hemodynamically stable.  Seen by PT OT and they recommended home health.  Discussed with his Sister Kevan Rosebush over the phone who agrees with plan of discharge.  He is symptom-free.  Being discharged in stable condition.  No changes in medication.  Discharge Diagnoses:  Principal Problem:   Atrial fibrillation with RVR ([place]) Active Problems:   Hypothyroidism (post surgical)   Benign essential HTN   Elevated troponin   Chest pain   CKD (chronic kidney disease), stage III   Type II diabetes mellitus with renal manifestations (HCC)   GERD (gastroesophageal reflux disease)   Hyperlipidemia LDL goal <70    Discharge Instructions   Allergies as of 07/13/2019      Reactions   Colchicine Anaphylaxis   Hibiclens [chlorhexidine] Hives   Atorvastatin Other (See Comments)   Joint pain    Ceftin Diarrhea  Celebrex [celecoxib] Swelling   Codeine Hives   Erythromycin Diarrhea   Ezetimibe Other (See Comments)   Joint pain    Iodinated Diagnostic Agents Nausea And Vomiting, Rash   Oxycodone-acetaminophen Hives   Rosuvastatin Other (See Comments)   Joint pain     Simvastatin Other (See Comments)   Joint pain    Cefpodoxime Rash   Cefuroxime Axetil Rash   Nitroglycerin Other (See Comments)   Lowers patient's B/P   Tetanus Toxoid Rash   Vantin Other (See Comments)   Unknown/not noted on MAR??      Medication List    TAKE these medications   alfuzosin 10 MG 24 hr tablet Commonly known as: UROXATRAL Take 1 tablet (10 mg total) by mouth at bedtime.   Alogliptin Benzoate 12.5 MG Tabs Take 12.5 mg by mouth.   apixaban 2.5 MG Tabs tablet Commonly known as: ELIQUIS Take 2.5 mg by mouth 2 (two) times daily.   ARTIFICIAL TEARS OP Place 1 drop into both eyes 4 (four) times daily.   benzonatate 100 MG capsule Commonly known as: TESSALON Take 100 mg by mouth every 8 (eight) hours as needed for cough.   brinzolamide 1 % ophthalmic suspension Commonly known as: AZOPT Place 1 drop into the right eye 3 (three) times daily.   carbidopa-levodopa 25-100 MG tablet Commonly known as: SINEMET IR Take 1 tablet by mouth 3 (three) times daily.   clopidogrel 75 MG tablet Commonly known as: PLAVIX Take 75 mg by mouth daily.   colesevelam 625 MG tablet Commonly known as: WELCHOL Take 1,875 mg by mouth every evening.   Combigan 0.2-0.5 % ophthalmic solution Generic drug: brimonidine-timolol Place 1 drop into both eyes 2 (two) times daily.   diltiazem 120 MG 24 hr capsule Commonly known as: CARDIZEM CD Take 1 capsule (120 mg total) by mouth daily for 30 days.   docusate sodium 100 MG capsule Commonly known as: COLACE Take 100 mg by mouth 2 (two) times daily.   ferrous sulfate 325 (65 FE) MG EC tablet Take 1 tablet (325 mg total) by mouth 2 (two) times daily with a meal.   fluticasone 50 MCG/ACT nasal spray Commonly known as: FLONASE Place 2 sprays into both nostrils daily.   furosemide 20 MG tablet Commonly known as: LASIX Take 20 mg by mouth daily.   latanoprost 0.005 % ophthalmic solution Commonly known as: XALATAN Place 1 drop into  the right eye at bedtime.   levocetirizine 5 MG tablet Commonly known as: XYZAL Take 2.5 mg by mouth at bedtime.   levothyroxine 150 MCG tablet Commonly known as: SYNTHROID Take 150 mcg by mouth daily.   Melatonin 3 MG Subl Place 3 mg under the tongue at bedtime.   menthol-cetylpyridinium 3 MG lozenge Commonly known as: CEPACOL Take 1 lozenge by mouth every 4 (four) hours as needed for sore throat.   metoprolol succinate 25 MG 24 hr tablet Commonly known as: TOPROL-XL Take 0.5 tablets (12.5 mg total) by mouth daily for 30 days.   mirabegron ER 50 MG Tb24 tablet Commonly known as: MYRBETRIQ Take 1 tablet (50 mg total) by mouth daily.   pantoprazole 40 MG tablet Commonly known as: PROTONIX Take 40 mg by mouth daily.   polyethylene glycol 17 g packet Commonly known as: MIRALAX / GLYCOLAX Take 17 g by mouth daily. MIX AND DRINK   potassium chloride 10 MEQ tablet Commonly known as: KLOR-CON Take 10 mEq by mouth daily. Dissolve in applesauce   PreserVision/Lutein Caps Take  1 capsule by mouth 2 (two) times daily.   sertraline 25 MG tablet Commonly known as: ZOLOFT Take 25 mg by mouth daily.   traMADol 50 MG tablet Commonly known as: Ultram Take 1 tablet (50 mg total) by mouth every 6 (six) hours as needed. What changed: reasons to take this   Uro-MP 118 MG Caps Take 1 capsule by mouth 3 (three) times daily.   Vitamin B-12 2500 MCG Subl Place 1 tablet under the tongue daily.   Vitamin D-3 125 MCG (5000 UT) Tabs Take 5,000 Units by mouth daily.   ZIMS MAX-FREEZE EX Apply 1 application topically every 8 (eight) hours as needed (pain). Apply to ankles       Follow-up Information    Turner, Eber Hong, MD Follow up in 1 week(s).   Specialty: Cardiology Contact information: [address] [place] [address] [phone]              Allergies  Allergen Reactions  . Colchicine Anaphylaxis  . Hibiclens [Chlorhexidine] Hives  .  Atorvastatin Other (See Comments)    Joint pain   . Ceftin Diarrhea  . Celebrex [Celecoxib] Swelling  . Codeine Hives  . Erythromycin Diarrhea  . Ezetimibe Other (See Comments)    Joint pain   . Iodinated Diagnostic Agents Nausea And Vomiting and Rash  . Oxycodone-Acetaminophen Hives  . Rosuvastatin Other (See Comments)    Joint pain   . Simvastatin Other (See Comments)    Joint pain   . Cefpodoxime Rash  . Cefuroxime Axetil Rash  . Nitroglycerin Other (See Comments)    Lowers patient's B/P  . Tetanus Toxoid Rash  . Vantin Other (See Comments)    Unknown/not noted on MAR??    Consultations: None   Procedures/Studies: DG Chest Port 1 View  Result Date: 07/12/2019 CLINICAL DATA:  Chest pain EXAM: PORTABLE CHEST 1 VIEW COMPARISON:  November 13, 2018 FINDINGS: Lungs are clear. Heart size and pulmonary vascularity are normal. Moderate hiatal type hernia present. No adenopathy. There is aortic atherosclerosis. There is calcification in each carotid artery. No bone lesions. IMPRESSION: Lungs clear.  Stable cardiac silhouette.  Hiatal hernia present. Aortic Atherosclerosis (ICD10-I70.0). There are also foci of carotid artery calcification bilaterally. Electronically Signed   By: Lowella Grip III M.D.   On: 07/12/2019 09:52   ECHOCARDIOGRAM COMPLETE  Result Date: 07/12/2019    ECHOCARDIOGRAM REPORT   Patient Name:   BOBBI KOZAKIEWICZ [facility] Date of Exam: 07/12/2019 Medical Rec #:  [id number]      Height:       69.0 in Accession #:    [id number]     Weight:       158.0 lb Date of Birth:  01/01/1932     BSA:          1.869 m Patient Age:    23 years       BP:           144/81 mmHg Patient Gender: M              HR:           70 bpm. Exam Location:  Inpatient Procedure: 2D Echo, Cardiac Doppler and Color Doppler Indications:    I48.0 Paroxysmal atrial fibrillation  History:        Patient has prior history of Echocardiogram examinations, most                 recent 03/11/2018. Stroke; Risk  Factors:Hypertension, Diabetes  and Dyslipidemia. Cancer. CKD. GERD.  Sonographer:    Jonelle Sidle Dance Referring Phys: [id number] [place]  1. Mild concentric LVH with moderate hypertrophy of the basal septum. Left ventricular ejection fraction, by estimation, is 55 to 60%. The left ventricle has normal function. The left ventricle has no regional wall motion abnormalities. There is mild concentric left ventricular hypertrophy. Left ventricular diastolic parameters are consistent with Grade II diastolic dysfunction (pseudonormalization).  2. Right ventricular systolic function is normal. The right ventricular size is normal. There is mildly elevated pulmonary artery systolic pressure.  3. Left atrial size was moderately dilated.  4. The mitral valve is normal in structure. Trivial mitral valve regurgitation. No evidence of mitral stenosis.  5. The aortic valve is tricuspid. Aortic valve regurgitation is mild. No aortic stenosis is present.  6. The inferior vena cava is dilated in size with <50% respiratory variability, suggesting right atrial pressure of 15 mmHg. FINDINGS  Left Ventricle: Mild concentric LVH with moderate hypertrophy of the basal septum. Left ventricular ejection fraction, by estimation, is 55 to 60%. The left ventricle has normal function. The left ventricle has no regional wall motion abnormalities. The  left ventricular internal cavity size was normal in size. There is mild concentric left ventricular hypertrophy. Left ventricular diastolic parameters are consistent with Grade II diastolic dysfunction (pseudonormalization). Right Ventricle: The right ventricular size is normal. No increase in right ventricular wall thickness. Right ventricular systolic function is normal. There is mildly elevated pulmonary artery systolic pressure. The tricuspid regurgitant velocity is 2.42  m/s, and with an assumed right atrial pressure of 15 mmHg, the estimated right ventricular  systolic pressure is 51.7 mmHg. Left Atrium: Left atrial size was moderately dilated. Right Atrium: Right atrial size was normal in size. Pericardium: There is no evidence of pericardial effusion. Mitral Valve: The mitral valve is normal in structure. There is mild thickening of the anterior and posterior mitral valve leaflet(s). Normal mobility of the mitral valve leaflets. Mild mitral annular calcification. Trivial mitral valve regurgitation. No  evidence of mitral valve stenosis. Tricuspid Valve: The tricuspid valve is normal in structure. Tricuspid valve regurgitation is trivial. No evidence of tricuspid stenosis. Aortic Valve: The aortic valve is tricuspid. . There is moderate thickening and moderate calcification of the aortic valve. Aortic valve regurgitation is mild. Aortic regurgitation PHT measures 447 msec. No aortic stenosis is present. There is moderate thickening of the aortic valve. There is moderate calcification of the aortic valve. Pulmonic Valve: The pulmonic valve was normal in structure. Pulmonic valve regurgitation is trivial. No evidence of pulmonic stenosis. Aorta: The aortic root is normal in size and structure. Venous: The inferior vena cava is dilated in size with less than 50% respiratory variability, suggesting right atrial pressure of 15 mmHg. IAS/Shunts: No atrial level shunt detected by color flow Doppler.  LEFT VENTRICLE PLAX 2D LVIDd:         3.97 cm LVIDs:         3.34 cm LV PW:         1.43 cm LV IVS:        1.21 cm LVOT diam:     2.10 cm LV SV:         55 LV SV Index:   29 LVOT Area:     3.46 cm  RIGHT VENTRICLE          IVC RV Basal diam:  3.24 cm  IVC diam: 2.47 cm RV Mid diam:    3.05  cm TAPSE (M-mode): 1.6 cm LEFT ATRIUM             Index       RIGHT ATRIUM           Index LA diam:        4.70 cm 2.51 cm/m  RA Area:     17.90 cm LA Vol (A2C):   78.0 ml 41.73 ml/m RA Volume:   40.70 ml  21.78 ml/m LA Vol (A4C):   51.2 ml 27.39 ml/m LA Biplane Vol: 67.1 ml 35.90 ml/m   AORTIC VALVE LVOT Vmax:   68.70 cm/s LVOT Vmean:  49.450 cm/s LVOT VTI:    0.158 m AI PHT:      447 msec  AORTA Ao Root diam: 3.80 cm Ao Asc diam:  3.50 cm MITRAL VALVE               TRICUSPID VALVE MV Area (PHT): 3.21 cm    TR Peak grad:   23.4 mmHg MV Decel Time: 236 msec    TR Vmax:        242.00 cm/s MV E velocity: 87.80 cm/s MV A velocity: 59.70 cm/s  SHUNTS MV E/A ratio:  1.47        Systemic VTI:  0.16 m                            Systemic Diam: 2.10 cm Skeet Latch MD Electronically signed by Skeet Latch MD Signature Date/Time: 07/12/2019/4:32:51 PM    Final       Discharge Exam: Vitals:   07/13/19 0823 07/13/19 1308  BP: (!) 139/92 (!) 148/92  Pulse: 69 75  Resp: 18 20  Temp: 97.7 F (36.5 C) 97.7 F (36.5 C)  SpO2: 96% 97%   Vitals:   07/13/19 0413 07/13/19 0543 07/13/19 0823 07/13/19 1308  BP: 122/77 127/75 (!) 139/92 (!) 148/92  Pulse: 63 74 69 75  Resp: 20  18 20   Temp: 98 F (36.7 C) 97.8 F (36.6 C) 97.7 F (36.5 C) 97.7 F (36.5 C)  TempSrc: Oral Oral Oral Oral  SpO2: 94% 92% 96% 97%  Weight:  67.9 kg    Height:        General: Pt is alert, awake, not in acute distress Cardiovascular: RRR, S1/S2 +, no rubs, no gallops Respiratory: CTA bilaterally, no wheezing, no rhonchi Abdominal: Soft, NT, ND, bowel sounds + Extremities: no edema, no cyanosis    The results of significant diagnostics from this hospitalization (including imaging, microbiology, ancillary and laboratory) are listed below for reference.     Microbiology: Recent Results (from the past 240 hour(s))  SARS Coronavirus 2 by RT PCR (hospital order, performed in [facility] hospital lab) Nasopharyngeal Nasopharyngeal Swab     Status: None   Collection Time: 07/12/19 12:33 PM   Specimen: Nasopharyngeal Swab  Result Value Ref Range Status   SARS Coronavirus 2 NEGATIVE NEGATIVE Final    Comment: (NOTE) SARS-CoV-2 target nucleic acids are NOT DETECTED.  The SARS-CoV-2 RNA is generally  detectable in upper and lower respiratory specimens during the acute phase of infection. The lowest concentration of SARS-CoV-2 viral copies this assay can detect is 250 copies / mL. A negative result does not preclude SARS-CoV-2 infection and should not be used as the sole basis for treatment or other patient management decisions.  A negative result may occur with improper specimen collection / handling, submission of specimen other than nasopharyngeal  swab, presence of viral mutation(s) within the areas targeted by this assay, and inadequate number of viral copies (<250 copies / mL). A negative result must be combined with clinical observations, patient history, and epidemiological information.  Fact Sheet for Patients:   StrictlyIdeas.no  Fact Sheet for Healthcare Providers: BankingDealers.co.za  This test is not yet approved or  cleared by the [place] FDA and has been authorized for detection and/or diagnosis of SARS-CoV-2 by FDA under an Emergency Use Authorization (EUA).  This EUA will remain in effect (meaning this test can be used) for the duration of the COVID-19 declaration under Section 564(b)(1) of the Act, 21 U.S.C. section 360bbb-3(b)(1), unless the authorization is terminated or revoked sooner.  Performed at [facility], [place] [address], [place],  [address]      Labs: BNP (last 3 results) No results for input(s): BNP in the last 8760 hours. Basic Metabolic Panel: Recent Labs  Lab 07/12/19 1044 07/12/19 1116 07/12/19 1230 07/13/19 0414  NA 139  --   --  139  K 4.3  --   --  3.7  CL 105  --   --  104  CO2 24  --   --  27  GLUCOSE 111*  --   --  115*  BUN 29*  --   --  26*  CREATININE 1.33*  --   --  1.44*  CALCIUM 8.8*  --   --  8.8*  MG  --  2.1  --   --   PHOS  --   --  3.8  --    Liver Function Tests: Recent Labs  Lab 07/13/19 0414  AST 9*  ALT 5  ALKPHOS 68  BILITOT 0.7  PROT  5.8*  ALBUMIN 3.3*   No results for input(s): LIPASE, AMYLASE in the last 168 hours. No results for input(s): AMMONIA in the last 168 hours. CBC: Recent Labs  Lab 07/12/19 1044 07/13/19 0414  WBC 7.0 6.5  HGB 12.7* 12.5*  HCT 39.2 38.7*  MCV 96.1 94.6  PLT 182 189   Cardiac Enzymes: No results for input(s): CKTOTAL, CKMB, CKMBINDEX, TROPONINI in the last 168 hours. BNP: Invalid input(s): POCBNP CBG: No results for input(s): GLUCAP in the last 168 hours. D-Dimer No results for input(s): DDIMER in the last 72 hours. Hgb A1c No results for input(s): HGBA1C in the last 72 hours. Lipid Profile No results for input(s): CHOL, HDL, LDLCALC, TRIG, CHOLHDL, LDLDIRECT in the last 72 hours. Thyroid function studies Recent Labs    07/12/19 1415  TSH 0.421   Anemia work up No results for input(s): VITAMINB12, FOLATE, FERRITIN, TIBC, IRON, RETICCTPCT in the last 72 hours. Urinalysis    Component Value Date/Time   COLORURINE BLUE (A) 07/12/2019 1230   APPEARANCEUR HAZY (A) 07/12/2019 1230   LABSPEC  07/12/2019 1230    TEST NOT REPORTED DUE TO COLOR INTERFERENCE OF URINE PIGMENT   PHURINE  07/12/2019 1230    TEST NOT REPORTED DUE TO COLOR INTERFERENCE OF URINE PIGMENT   GLUCOSEU (A) 07/12/2019 1230    TEST NOT REPORTED DUE TO COLOR INTERFERENCE OF URINE PIGMENT   HGBUR (A) 07/12/2019 1230    TEST NOT REPORTED DUE TO COLOR INTERFERENCE OF URINE PIGMENT   BILIRUBINUR (A) 07/12/2019 1230    TEST NOT REPORTED DUE TO COLOR INTERFERENCE OF URINE PIGMENT   KETONESUR (A) 07/12/2019 1230    TEST NOT REPORTED DUE TO COLOR INTERFERENCE OF URINE PIGMENT   PROTEINUR (  A) 07/12/2019 1230    TEST NOT REPORTED DUE TO COLOR INTERFERENCE OF URINE PIGMENT   UROBILINOGEN 0.2 05/26/2014 2040   NITRITE (A) 07/12/2019 1230    TEST NOT REPORTED DUE TO COLOR INTERFERENCE OF URINE PIGMENT   LEUKOCYTESUR (A) 07/12/2019 1230    TEST NOT REPORTED DUE TO COLOR INTERFERENCE OF URINE PIGMENT   Sepsis  Labs Invalid input(s): PROCALCITONIN,  WBC,  LACTICIDVEN Microbiology Recent Results (from the past 240 hour(s))  SARS Coronavirus 2 by RT PCR (hospital order, performed in [place] hospital lab) Nasopharyngeal Nasopharyngeal Swab     Status: None   Collection Time: 07/12/19 12:33 PM   Specimen: Nasopharyngeal Swab  Result Value Ref Range Status   SARS Coronavirus 2 NEGATIVE NEGATIVE Final    Comment: (NOTE) SARS-CoV-2 target nucleic acids are NOT DETECTED.  The SARS-CoV-2 RNA is generally detectable in upper and lower respiratory specimens during the acute phase of infection. The lowest concentration of SARS-CoV-2 viral copies this assay can detect is 250 copies / mL. A negative result does not preclude SARS-CoV-2 infection and should not be used as the sole basis for treatment or other patient management decisions.  A negative result may occur with improper specimen collection / handling, submission of specimen other than nasopharyngeal swab, presence of viral mutation(s) within the areas targeted by this assay, and inadequate number of viral copies (<250 copies / mL). A negative result must be combined with clinical observations, patient history, and epidemiological information.  Fact Sheet for Patients:   StrictlyIdeas.no  Fact Sheet for Healthcare Providers: BankingDealers.co.za  This test is not yet approved or  cleared by the [place] FDA and has been authorized for detection and/or diagnosis of SARS-CoV-2 by FDA under an Emergency Use Authorization (EUA).  This EUA will remain in effect (meaning this test can be used) for the duration of the COVID-19 declaration under Section 564(b)(1) of the Act, 21 U.S.C. section 360bbb-3(b)(1), unless the authorization is terminated or revoked sooner.  Performed at [facility], [place] [address], [place], [place] [address]      Time coordinating discharge: Over 30  minutes  SIGNED:   Darliss Cheney, MD  [facility] 07/13/2019, 1:29 PM  If 7PM-7AM, please contact night-coverage [email or web address]

## 2019-07-13 NOTE — Evaluation (Signed)
Occupational Therapy Evaluation Patient Details Name: [name] MRN: [id number] DOB: [birth year] Today's Date: 07/13/2019    History of Present Illness [name] is a 86 y.o. male with medical history significant of paroxysmal A. fib-on Eliquis, coronary artery disease status post PCI, CVA, Parkinson disease, hypertension, hypothyroidism, GERD, normocytic anemia, depression/anxiety, hyperlipidemia, CKD stage III brought by EMS to emergency department for the evaluation of chest pain.   Clinical Impression   Pt limited historian and HOH. Reports PTA he used a walker to ambulate sometimes. Reported he received help with LB ADLs and then reported later on that he did not. Pt is A&Ox3 disoriented to time. Requires Mod-Min A with verbal cues for ADLs due to decreased safety awareness, weakness, and deficits in balance and cognition. Requires Min A with transfers and ambulation with verbal cues for safety with walker. Vitals were monitored during session: HR ranged from 73bpm at rest and 97bpm during activity in session. Believe pt would benefit from skilled OT services acutely and at the [facility] level at ALF level (if 24/7 assist is available for OOB and ADLs) to increase independence and return to PLOF.   Supervising therapist assisted in treatment and clinical impression.    Follow Up Recommendations  Home health OT;Supervision/Assistance - 24 hour;Other (comment) (only if ALF can provide 24/7 assist)    Equipment Recommendations  None recommended by OT       Precautions / Restrictions Precautions Precautions: Fall Restrictions Weight Bearing Restrictions: No      Mobility Bed Mobility Overal bed mobility: Needs Assistance Bed Mobility: Supine to Sit     Supine to sit: Min assist     General bed mobility comments: Min A for B LE elevation back onto bed to lay down  Transfers Overall transfer level: Needs assistance Equipment used: Rolling walker (2 wheeled) Transfers: Sit  to/from Stand Sit to Stand: Min assist         General transfer comment: Min A to steady. Pt remained ridgid with flexed posture.    Balance Overall balance assessment: Needs assistance Sitting-balance support: No upper extremity supported;Feet supported Sitting balance-Leahy Scale: Fair     Standing balance support: Bilateral upper extremity supported;During functional activity Standing balance-Leahy Scale: Poor Standing balance comment: Prefers UE support. Not steady without support when hands are off RW during functional activity                           ADL either performed or assessed with clinical judgement   ADL Overall ADL's : Needs assistance/impaired     Grooming: Min guard;Cueing for sequencing;Standing (with RW) Grooming Details (indicate cue type and reason): Pt able to wash face with min guard and verbal cues for sequencing standing at sink Upper Body Bathing: Minimal assistance;Cueing for sequencing;Sitting Upper Body Bathing Details (indicate cue type and reason): Min A with cueing for sequencing due to decreased cognition, deficits in balance, and weakness. Lower Body Bathing: Min guard;Cueing for sequencing;Sitting/lateral leans Lower Body Bathing Details (indicate cue type and reason): Min guard with seated LB bathing for deficits in balance  Upper Body Dressing : Min guard;Sitting;Cueing for sequencing Upper Body Dressing Details (indicate cue type and reason): Min guard for safety and deficits in balance Lower Body Dressing: Cueing for sequencing;Sit to/from stand;Moderate assistance Lower Body Dressing Details (indicate cue type and reason): Mod A due to deficits in balance Toilet Transfer: Minimal assistance;Cueing for safety;Cueing for sequencing;Ambulation;RW;Grab bars;Comfort height toilet Toilet Transfer Details (  indicate cue type and reason): Min A for transfers and cueing for hand placement with RW and sequencing Toileting- Clothing  Manipulation and Hygiene: Minimal assistance;Sitting/lateral lean Toileting - Clothing Manipulation Details (indicate cue type and reason): Min A due to balance     Functional mobility during ADLs: Minimal assistance;Min guard;Rolling walker;Cueing for safety;Cueing for sequencing General ADL Comments: Min guard-Mod A with ADLs due to weakness, balance deficits, and deficits in awareness and safety     Vision Baseline Vision/History: Wears glasses Wears Glasses: Reading only Patient Visual Report: No change from baseline Vision Assessment?: No apparent visual deficits            Pertinent Vitals/Pain Pain Assessment: No/denies pain     Hand Dominance Right   Extremity/Trunk Assessment Upper Extremity Assessment Upper Extremity Assessment: Generalized weakness   Lower Extremity Assessment Lower Extremity Assessment: Defer to PT evaluation   Cervical / Trunk Assessment Cervical / Trunk Assessment: Kyphotic   Communication Communication Communication: HOH   Cognition Arousal/Alertness: Awake/alert Behavior During Therapy: WFL for tasks assessed/performed Overall Cognitive Status: No family/caregiver present to determine baseline cognitive functioning Area of Impairment: Orientation;Attention;Following commands;Safety/judgement;Awareness;Problem solving;Memory                 Orientation Level: Disoriented to;Time (unsure if it was the 1st or 2nd and unclear year) Current Attention Level: Sustained Memory: Decreased short-term memory (back and forth about assist needed PTA) Following Commands: Follows one step commands with increased time Safety/Judgement: Decreased awareness of safety;Decreased awareness of deficits Awareness: Emergent Problem Solving: Slow processing;Decreased initiation;Difficulty sequencing;Requires verbal cues;Requires tactile cues General Comments: pt limited historian - unsure if he has deficits in awareness pertaining to PLOF and living  situation   General Comments  73-97bpm during session            [place] expects to be discharged to:: Assisted living                             Home Equipment: Walker - 2 wheels;Bedside commode;Grab bars - tub/shower;Shower seat;Other (comment) (adjustable bed)   Additional Comments: patient limited historian. Lives at ALF and reports using RW for long walks      Prior Functioning/Environment Level of Independence: Needs assistance  Gait / Transfers Assistance Needed: pt reports using RW when ambulating long distances ADL's / Homemaking Assistance Needed: at first said he needs help with bathing/dressing then said he didn't   Comments: Pt limited historian - unsure of validity of information        OT Problem List: Decreased strength;Decreased activity tolerance;Impaired balance (sitting and/or standing);Decreased cognition;Decreased safety awareness;Decreased knowledge of use of DME or AE      OT Treatment/Interventions: Self-care/ADL training;Therapeutic exercise;DME and/or AE instruction;Energy conservation;Therapeutic activities;Cognitive remediation/compensation;Patient/family education;Balance training    OT Goals(Current goals can be found in the care plan section) Acute Rehab OT Goals Patient Stated Goal: unable to report Time For Goal Achievement: 07/27/19 Potential to Achieve Goals: Good  OT Frequency: Min 2X/week    AM-PAC OT "6 Clicks" Daily Activity     Outcome Measure Help from another person eating meals?: None Help from another person taking care of personal grooming?: A Little Help from another person toileting, which includes using toliet, bedpan, or urinal?: A Little Help from another person bathing (including washing, rinsing, drying)?: A Little Help from another person to put on and taking off regular upper body clothing?: A Little Help from another person to put  on and taking off regular lower body clothing?: A  Lot 6 Click Score: 18   End of Session Equipment Utilized During Treatment: Gait belt;Rolling walker Nurse Communication: Mobility status  Activity Tolerance: Patient tolerated treatment well Patient left: in bed;with call bell/phone within reach;with bed alarm set  OT Visit Diagnosis: Unsteadiness on feet (R26.81);Other abnormalities of gait and mobility (R26.89);Muscle weakness (generalized) (M62.81);Other symptoms and signs involving cognitive function                Time: 1325-1351 OT Time Calculation (min): 26 min Charges:  OT General Charges $OT Visit: 1 Visit OT Evaluation $OT Eval Moderate Complexity: 1 Mod OT Treatments $Self Care/Home Management : 8-22 mins  Theresa Dohrman/OTS  Jakyle Petrucelli 07/13/2019, 3:04 PM

## 2019-07-13 NOTE — Social Work (Signed)
CSW has faxed FL2 and d/c summary to [facility]. CSW has confirmed that [name] has received it.   Emeterio Reeve, Latanya Presser, [place] Social Worker [phone]

## 2019-07-13 NOTE — Discharge Instructions (Signed)

## 2019-07-13 NOTE — Care Management CC44 (Signed)
Condition Code 44 Documentation Completed  Patient Details  Name: [name] MRN: [id number] Date of Birth: Oct 20, 1931   Condition Code 44 given:  Yes Patient signature on Condition Code 44 notice:  Yes Documentation of 2 MD's agreement:  Yes Code 44 added to claim:  Yes    Ninfa Meeker, RN 07/13/2019, 2:00 PM

## 2019-07-13 NOTE — Progress Notes (Signed)
   07/13/19 1100  PT Recommendation  Follow Up Recommendations Home health PT;Supervision/Assistance - 24 hour;Supervision for mobility/OOB  PT equipment None recommended by PT  Full note to follow.  Pt appears to be at baseline.  If A living provides min assist for mobility, pt should be fine to d/c back there and get HHPT follow up.   Marli Diego W,PT Acute Rehabilitation Services Pager:  [phone]  Office:  [phone]

## 2019-07-13 NOTE — NC FL2 (Signed)
[place] LEVEL OF CARE SCREENING TOOL     IDENTIFICATION  Patient Name: [name] Birthdate: 1931/11/24 Sex: male Admission Date (Current Location): 07/12/2019  [facility] and [place] Number:  Herbalist and Address:  [facility], [place] [address] [place], [place] [address]      Provider Number: [id number]  Attending Physician Name and Address:  Darliss Cheney, MD  Relative Name and Phone Number:       Current Level of Care: Hospital Recommended Level of Care: [place] Prior Approval Number:    Date Approved/Denied:   PASRR Number:    Discharge Plan: Other (Comment) ([facility])    Current Diagnoses: Patient Active Problem List   Diagnosis Date Noted  . Symptomatic anemia 11/13/2018  . Parkinson's disease ([place]) 11/13/2018  . AF (paroxysmal atrial fibrillation) ([place])   . Coronary artery disease involving native coronary artery of native heart without angina pectoris   . Hyperlipidemia LDL goal <70   . Atrial fibrillation with RVR ([place]) 08/14/2017  . Urinary tract infection with hematuria   . Non-ST elevation (NSTEMI) myocardial infarction ([place])   . GERD (gastroesophageal reflux disease) 08/04/2017  . Elevated troponin 08/03/2017  . Chest pain 08/03/2017  . CKD (chronic kidney disease), stage III 08/03/2017  . Type II diabetes mellitus with renal manifestations ([place]) 08/03/2017  . Pseudophakia of both eyes 05/31/2017  . Retained ureteral stent 04/22/2016  . Ureteral calculus 01/25/2016  . Benign essential HTN 10/24/2015  . CVA (cerebral vascular accident) ([place]) 10/14/2014  . TIA (transient ischemic attack)   . CME (cystoid macular edema), left 08/10/2013  . Squamous cell carcinoma of eyelid 05/15/2013  . Blepharitis 03/16/2013  . Posterior vitreous detachment of right eye 10/23/2011  . Exudative age-related macular degeneration ([place]) 07/28/2011  . Nonproliferative diabetic retinopathy ([place])  04/07/2011  . Insomnia, unspecified 05/19/2010  . ATAXIA 03/17/2010  . DIZZINESS 03/06/2010  . Chronic constipation 02/21/2008  . OSTEOARTHRITIS, KNEE, SEVERE 01/12/2008  . Hypothyroidism (post surgical) 10/15/2006  . Diabetes mellitus, insulin dependent (IDDM), controlled 04/22/2006  . URTICARIA 04/22/2006  . POPLITEAL CYST 04/22/2006  . THYROIDECTOMY, SUBTOTAL, HX OF 04/22/2006  . TOTAL KNEE REPLACEMENT, HX OF 04/22/2006    Orientation RESPIRATION BLADDER Height & Weight     Self  Normal Continent Weight: 149 lb 11.2 oz (67.9 kg) Height:  5\' 10"  (177.8 cm)  BEHAVIORAL SYMPTOMS/MOOD NEUROLOGICAL BOWEL NUTRITION STATUS      Continent Diet (See discharge summary)  AMBULATORY STATUS COMMUNICATION OF NEEDS Skin   Supervision Verbally Normal                       Personal Care Assistance Level of Assistance  Bathing, Feeding, Dressing Bathing Assistance: Independent Feeding assistance: Independent Dressing Assistance: Independent     Functional Limitations Info  Sight, Speech, Hearing Sight Info: Adequate Hearing Info: Adequate Speech Info: Adequate    SPECIAL CARE FACTORS FREQUENCY  PT (By licensed PT), OT (By licensed OT)     PT Frequency: 3x a week OT Frequency: 3x a week            Contractures Contractures Info: Not present    Additional Factors Info  Code Status, Allergies Code Status Info: DNR Allergies Info: Colchicine Hibiclens Atorvastatin Ceftin Celebrex Codeine Erythromycin Ezetimibe Iodinated Diagnostic Agents Oxycodone-acetaminophen Rosuvastatin Simvastatin Cefpodoxime Cefuroxime Axetil Nitroglycerin Tetanus Toxoid Vantin           Current Medications (07/13/2019):  This is the current hospital active medication list Current Facility-Administered Medications  Medication Dose Route Frequency Provider Last Rate Last Admin  . acetaminophen (TYLENOL) tablet 650 mg  650 mg Oral Q6H PRN Pahwani, Rinka R, MD       Or  . acetaminophen (TYLENOL)  suppository 650 mg  650 mg Rectal Q6H PRN Pahwani, Rinka R, MD      . alfuzosin (UROXATRAL) 24 hr tablet 10 mg  10 mg Oral QHS Pahwani, Rinka R, MD   10 mg at 07/13/19 0017  . apixaban (ELIQUIS) tablet 2.5 mg  2.5 mg Oral BID Pahwani, Rinka R, MD   2.5 mg at 07/13/19 0927  . benzonatate (TESSALON) capsule 100 mg  100 mg Oral Q8H PRN Pahwani, Rinka R, MD      . brimonidine (ALPHAGAN) 0.2 % ophthalmic solution 1 drop  1 drop Both Eyes BID Pahwani, Rinka R, MD   1 drop at 07/13/19 0935  . brinzolamide (AZOPT) 1 % ophthalmic suspension 1 drop  1 drop Right Eye TID Pahwani, Michell Heinrich, MD   1 drop at 07/13/19 0936  . carbidopa-levodopa (SINEMET IR) 25-100 MG per tablet immediate release 1 tablet  1 tablet Oral TID Pahwani, Rinka R, MD   1 tablet at 07/13/19 1203  . clopidogrel (PLAVIX) tablet 75 mg  75 mg Oral Daily Pahwani, Rinka R, MD   75 mg at 07/13/19 0927  . colesevelam [facility]) tablet 1,875 mg  1,875 mg Oral QPM Pahwani, Rinka R, MD   1,875 mg at 07/13/19 0018  . diltiazem (CARDIZEM CD) 24 hr capsule 120 mg  120 mg Oral Daily Darliss Cheney, MD   120 mg at 07/13/19 0928  . diltiazem (CARDIZEM) 125 mg in dextrose 5% 125 mL (1 mg/mL) infusion  5-15 mg/hr Intravenous Continuous Petrucelli, Samantha R, PA-C 10 mL/hr at 07/12/19 2101 10 mg/hr at 07/12/19 2101  . docusate sodium (COLACE) capsule 100 mg  100 mg Oral BID Pahwani, Rinka R, MD   100 mg at 07/13/19 0927  . ferrous sulfate tablet 325 mg  325 mg Oral BID WC Pahwani, Rinka R, MD   325 mg at 07/13/19 0927  . fluticasone (FLONASE) 50 MCG/ACT nasal spray 2 spray  2 spray Each Nare Daily Pahwani, Ravi, MD      . furosemide (LASIX) tablet 20 mg  20 mg Oral Daily Pahwani, Rinka R, MD   20 mg at 07/13/19 0927  . latanoprost (XALATAN) 0.005 % ophthalmic solution 1 drop  1 drop Right Eye QHS Pahwani, Rinka R, MD   1 drop at 07/12/19 2328  . levothyroxine (SYNTHROID) tablet 150 mcg  150 mcg Oral Q0600 Pahwani, Rinka R, MD   150 mcg at 07/13/19 5361  .  loratadine (CLARITIN) tablet 10 mg  10 mg Oral QHS Pahwani, Ravi, MD      . melatonin tablet 3 mg  3 mg Oral QHS Pahwani, Rinka R, MD   3 mg at 07/12/19 2329  . menthol-cetylpyridinium (CEPACOL) lozenge 3 mg  1 lozenge Oral Q4H PRN Pahwani, Rinka R, MD      . metoprolol succinate (TOPROL-XL) 24 hr tablet 12.5 mg  12.5 mg Oral Daily Darliss Cheney, MD   12.5 mg at 07/13/19 0926  . mirabegron ER (MYRBETRIQ) tablet 50 mg  50 mg Oral Daily Pahwani, Rinka R, MD   50 mg at 07/13/19 0927  . ondansetron (ZOFRAN) tablet 4 mg  4 mg Oral Q6H PRN Pahwani, Michell Heinrich, MD  Or  . ondansetron (ZOFRAN) injection 4 mg  4 mg Intravenous Q6H PRN Pahwani, Rinka R, MD      . pantoprazole (PROTONIX) EC tablet 40 mg  40 mg Oral Daily Pahwani, Rinka R, MD   40 mg at 07/13/19 0928  . polyethylene glycol (MIRALAX / GLYCOLAX) packet 17 g  17 g Oral Daily Pahwani, Rinka R, MD   17 g at 07/13/19 0928  . sertraline (ZOLOFT) tablet 25 mg  25 mg Oral Daily Pahwani, Rinka R, MD   25 mg at 07/13/19 0928  . timolol (TIMOPTIC) 0.5 % ophthalmic solution 1 drop  1 drop Both Eyes BID Pahwani, Rinka R, MD   1 drop at 07/13/19 0935  . traMADol (ULTRAM) tablet 50 mg  50 mg Oral Q6H PRN Pahwani, Rinka R, MD         Discharge Medications: Please see discharge summary for a list of discharge medications.  alfuzosin 10 MG 24 hr tablet Commonly known as: UROXATRAL Take 1 tablet (10 mg total) by mouth at bedtime.   Alogliptin Benzoate 12.5 MG Tabs Take 12.5 mg by mouth.   apixaban 2.5 MG Tabs tablet Commonly known as: ELIQUIS Take 2.5 mg by mouth 2 (two) times daily.   ARTIFICIAL TEARS OP Place 1 drop into both eyes 4 (four) times daily.   benzonatate 100 MG capsule Commonly known as: TESSALON Take 100 mg by mouth every 8 (eight) hours as needed for cough.   brinzolamide 1 % ophthalmic suspension Commonly known as: AZOPT Place 1 drop into the right eye 3 (three) times daily.   carbidopa-levodopa 25-100 MG  tablet Commonly known as: SINEMET IR Take 1 tablet by mouth 3 (three) times daily.   clopidogrel 75 MG tablet Commonly known as: PLAVIX Take 75 mg by mouth daily.   colesevelam 625 MG tablet Commonly known as: WELCHOL Take 1,875 mg by mouth every evening.   Combigan 0.2-0.5 % ophthalmic solution Generic drug: brimonidine-timolol Place 1 drop into both eyes 2 (two) times daily.   diltiazem 120 MG 24 hr capsule Commonly known as: CARDIZEM CD Take 1 capsule (120 mg total) by mouth daily for 30 days.   docusate sodium 100 MG capsule Commonly known as: COLACE Take 100 mg by mouth 2 (two) times daily.   ferrous sulfate 325 (65 FE) MG EC tablet Take 1 tablet (325 mg total) by mouth 2 (two) times daily with a meal.   fluticasone 50 MCG/ACT nasal spray Commonly known as: FLONASE Place 2 sprays into both nostrils daily.   furosemide 20 MG tablet Commonly known as: LASIX Take 20 mg by mouth daily.   latanoprost 0.005 % ophthalmic solution Commonly known as: XALATAN Place 1 drop into the right eye at bedtime.   levocetirizine 5 MG tablet Commonly known as: XYZAL Take 2.5 mg by mouth at bedtime.   levothyroxine 150 MCG tablet Commonly known as: SYNTHROID Take 150 mcg by mouth daily.   Melatonin 3 MG Subl Place 3 mg under the tongue at bedtime.   menthol-cetylpyridinium 3 MG lozenge Commonly known as: CEPACOL Take 1 lozenge by mouth every 4 (four) hours as needed for sore throat.   metoprolol succinate 25 MG 24 hr tablet Commonly known as: TOPROL-XL Take 0.5 tablets (12.5 mg total) by mouth daily for 30 days.   mirabegron ER 50 MG Tb24 tablet Commonly known as: MYRBETRIQ Take 1 tablet (50 mg total) by mouth daily.   pantoprazole 40 MG tablet Commonly known as: PROTONIX Take 40 mg  by mouth daily.   polyethylene glycol 17 g packet Commonly known as: MIRALAX / GLYCOLAX Take 17 g by mouth daily. MIX AND DRINK   potassium chloride 10 MEQ  tablet Commonly known as: KLOR-CON Take 10 mEq by mouth daily. Dissolve in applesauce   PreserVision/Lutein Caps Take 1 capsule by mouth 2 (two) times daily.   sertraline 25 MG tablet Commonly known as: ZOLOFT Take 25 mg by mouth daily.   traMADol 50 MG tablet Commonly known as: Ultram Take 1 tablet (50 mg total) by mouth every 6 (six) hours as needed. What changed: reasons to take this   Uro-MP 118 MG Caps Take 1 capsule by mouth 3 (three) times daily.   Vitamin B-12 2500 MCG Subl Place 1 tablet under the tongue daily.   Vitamin D-3 125 MCG (5000 UT) Tabs Take 5,000 Units by mouth daily.   ZIMS MAX-FREEZE EX Apply 1 application topically every 8 (eight) hours as needed (pain). Apply to ankles       Relevant Imaging Results:  Relevant Lab Results:   Additional Information SSN: [id number]  Emeterio Reeve, [place]

## 2019-07-13 NOTE — Social Work (Addendum)
Clinical Social Worker, assisting CSW Miranda, facilitated patient discharge including contacting patient family (this Probation officer spoke with sister [name] via telephone and she is in [place] and req PT[facility] pick up at this time) and facility to confirm patient discharge plans.  Clinical information faxed to facility and family agreeable with plan.  CSW arranged ambulance transport via PT[facility] to [facility]. RN to call [phone] with report prior to discharge.  Clinical Social Worker will sign off for now as social work intervention is no longer needed. Please consult [place] again if new need arises.  Westley Hummer, MSW, LCSW Clinical Social Worker

## 2019-07-13 NOTE — TOC Transition Note (Addendum)
Transition of Care [facility]) - CM/SW Discharge Note   Patient Details  Name: [name] MRN: [id number] Date of Birth: 03/31/1931  Transition of Care [facility]) CM/SW Contact:  Ninfa Meeker, RN Phone Number: [phone] (working remotely) 07/13/2019, 2:20 PM   Clinical Narrative:  Patient is extremely HOH. Case manager called his sisterKevan Rosebush [phone], to confirm [facility] arrangement. Multiple calls made, Message left. Patient is tentatively setup with [facility], CM will change is needed.  2:43pm: Patient is active with [facility] palliative services, [place] services will be provided by [facility]), CM spoke with Havery Moros, [place] Liaison.   3:56pm: Notified that [facility] works with [facility], Spoke with Joen Laura, [place], they will accept patient.   Final next level of care: Other (comment) (return to [facility]) Barriers to Discharge: No Barriers Identified   Patient Goals and CMS Choice   CMS Medicare.gov Compare Post Acute Care list provided to:: Patient Represenative (must comment)    Discharge Placement                       Discharge Plan and Services   Discharge Planning Services: CM Consult Post Acute Care Choice: Home Health          DME Arranged: N/A         HH Arranged: PT, OT HH Agency: [facility] Date [place]: 07/13/19 Time [place]: 1400  Representative spoken with at [place]: Havery Moros  Social Determinants of Health (SDOH) Interventions     Readmission Risk Interventions No flowsheet data found.

## 2019-07-13 NOTE — Care Management CC44 (Signed)
Condition Code 44 Documentation Completed  Patient Details  Name: [name] MRN: [id number] Date of Birth: 01/08/1932   Condition Code 44 given:  Yes Patient signature on Condition Code 44 notice:  Yes Documentation of 2 MD's agreement:  Yes Code 44 added to claim:  Yes    Ninfa Meeker, RN 07/13/2019, 2:00 PM

## 2019-07-13 NOTE — Evaluation (Signed)
Physical Therapy Evaluation Patient Details Name: [name] MRN: [id number] DOB: 1931-10-04 Today's Date: 07/13/2019   History of Present Illness  [name] is a 86 y.o. male with medical history significant of paroxysmal A. fib-on Eliquis, coronary artery disease status post PCI, CVA, Parkinson disease, hypertension, hypothyroidism, GERD, normocytic anemia, depression/anxiety, hyperlipidemia, CKD stage III brought by EMS to emergency department for the evaluation of chest pain.  Clinical Impression  Pt admitted with above diagnosis. Pt able to walk in room with min guard assist.  IF A living can provide min guard assist, pt should be able to return to A living.   Pt currently with functional limitations due to the deficits listed below (see PT Problem List). Pt will benefit from skilled PT to increase their independence and safety with mobility to allow discharge to the venue listed below.      Follow Up Recommendations Home health PT;Supervision/Assistance - 24 hour;Supervision for mobility/OOB    Equipment Recommendations  None recommended by PT    Recommendations for Other Services       Precautions / Restrictions Precautions Precautions: Fall Restrictions Weight Bearing Restrictions: No      Mobility  Bed Mobility Overal bed mobility: Needs Assistance Bed Mobility: Supine to Sit     Supine to sit: Min assist     General bed mobility comments: assist for LES and elevation of trunk  Transfers Overall transfer level: Needs assistance Equipment used: Rolling walker (2 wheeled) Transfers: Sit to/from Stand Sit to Stand: Min guard         General transfer comment: steadying assist needed.  Pt maintains flexed posture  Ambulation/Gait Ambulation/Gait assistance: Min guard Gait Distance (Feet): 45 Feet Assistive device: Rolling walker (2 wheeled) Gait Pattern/deviations: Decreased step length - right;Decreased step length - left;Decreased stride  length;Antalgic;Drifts right/left;Trunk flexed   Gait velocity interpretation: <1.31 ft/sec, indicative of household ambulator General Gait Details: Pt needed cues and assist for safety to steer RW, stay close to RW and for postural stability.  Pt needs guard assist for safety.   Stairs            Wheelchair Mobility    Modified Rankin (Stroke Patients Only)       Balance Overall balance assessment: Needs assistance Sitting-balance support: No upper extremity supported;Feet supported Sitting balance-Leahy Scale: Fair     Standing balance support: Bilateral upper extremity supported;During functional activity Standing balance-Leahy Scale: Poor Standing balance comment: relies on bil UE support                             Pertinent Vitals/Pain Pain Assessment: No/denies pain    Home Living Family/patient expects to be discharged to:: Assisted living               Home Equipment: Gilford Rile - 2 wheels      Prior Function                 Hand Dominance        Extremity/Trunk Assessment   Upper Extremity Assessment Upper Extremity Assessment: Defer to OT evaluation    Lower Extremity Assessment Lower Extremity Assessment: Generalized weakness    Cervical / Trunk Assessment Cervical / Trunk Assessment: Kyphotic  Communication      Cognition Arousal/Alertness: Awake/alert Behavior During Therapy: WFL for tasks assessed/performed Overall Cognitive Status: History of cognitive impairments - at baseline  General Comments: Pt unreliable historian and unsure if he realizes he has been living in A living      General Comments General comments (skin integrity, edema, etc.): 72 bpm, 96% O2 on RA, 134/91    Exercises     Assessment/Plan    PT Assessment Patient needs continued PT services  PT Problem List Decreased activity tolerance;Decreased balance;Decreased mobility;Decreased knowledge of  use of DME;Decreased safety awareness;Decreased knowledge of precautions;Cardiopulmonary status limiting activity       PT Treatment Interventions DME instruction;Gait training;Functional mobility training;Therapeutic activities;Therapeutic exercise;Balance training;Patient/family education    PT Goals (Current goals can be found in the Care Plan section)  Acute Rehab PT Goals Patient Stated Goal: unable to report PT Goal Formulation: With patient Time For Goal Achievement: 07/27/19 Potential to Achieve Goals: Good    Frequency Min 3X/week   Barriers to discharge Decreased caregiver support      Co-evaluation               AM-PAC PT "6 Clicks" Mobility  Outcome Measure Help needed turning from your back to your side while in a flat bed without using bedrails?: None Help needed moving from lying on your back to sitting on the side of a flat bed without using bedrails?: A Little Help needed moving to and from a bed to a chair (including a wheelchair)?: A Little Help needed standing up from a chair using your arms (e.g., wheelchair or bedside chair)?: A Little Help needed to walk in hospital room?: A Little Help needed climbing 3-5 steps with a railing? : A Lot 6 Click Score: 18    End of Session Equipment Utilized During Treatment: Gait belt Activity Tolerance: Patient limited by fatigue Patient left: with call bell/phone within reach;in bed;with bed alarm set Nurse Communication: Mobility status PT Visit Diagnosis: Unsteadiness on feet (R26.81);Muscle weakness (generalized) (M62.81)    Time: 3546-5681 PT Time Calculation (min) (ACUTE ONLY): 19 min   Charges:   PT Evaluation $PT Eval Moderate Complexity: 1 Mod          Javae Braaten W,PT Acute Rehabilitation Services Pager:  [phone]  Office:  [phone]    Denice Paradise 07/13/2019, 12:25 PM

## 2019-07-13 NOTE — Plan of Care (Signed)
Pt being d/c to assisted living facility.

## 2019-07-13 NOTE — Care Management (Signed)
Case Manager spoke with Clarene Critchley at [facility] and answered questions concerning patient needing follow up appt with Cardiologist re: Cardizem and Metoprolol, both ordered for 30 days.  Ricki Miller, RN BSN Case Manager [phone]

## 2019-07-13 NOTE — Care Management Obs Status (Signed)
[facility] NOTIFICATION   Patient Details  Name: [name] MRN: [id number] Date of Birth: 1931-12-19   Medicare Observation Status Notification Given:  Yes    Dawayne Patricia, RN 07/13/2019, 2:04 PM

## 2019-07-17 ENCOUNTER — Non-Acute Institutional Stay: Payer: Medicare Other | Admitting: Internal Medicine

## 2019-07-17 ENCOUNTER — Other Ambulatory Visit: Payer: Self-pay

## 2019-07-17 DIAGNOSIS — Z7189 Other specified counseling: Secondary | ICD-10-CM

## 2019-07-17 DIAGNOSIS — Z515 Encounter for palliative care: Secondary | ICD-10-CM

## 2019-07-17 NOTE — Progress Notes (Signed)
July 5th, 2020 [facility] Palliative Care Consult Note Telephone: [phone]  Fax: [phone]   PATIENT NAME: [name] ("patch") DOB: 09-14-1931 MRN: [id number] [place] [address] (move in date 04/30/2016)   PRIMARY CARE PROVIDER:  Lajean Manes, MD [address] [place] [address]   Dr. Aline [name] ([facility]) [phone]  Dr. Virgina [name] (Cardiology)   REFERRING PROVIDER: [place]: (sister) [name] [phone]. ([name]) [name] [phone]    ASSESSMENT / RECOMMENDATIONS:  1. Advance Care Planning:             A. Directives: DNR (present in facility chart; uploaded into [facility]EM[facility])             B. Goals of Care: Patient is s/p brief hospitalization for chest pain setting of afib with RVR. Converted back to NSR after Cardizem. Today he is in regular rhythm. No CP since hosp visit.   Happier now that he can more freely visit his sister and his little dog (his sister cares for this animal). Patient is a Multimedia programmer. He uses the facility exercise bike at least 3x/week, for  hour each session. He's getting out on his electric scooter doing laps around the building. He's trying to keep his weight stable. He has had a h/o obesity  2.Cognitive / Functional status: Patient is alert and conversant. He remembered me from my last visit 3 months earlier. His voice is soft, and he's a little hard of hearing..     Patient continues to ambulate about in his room without assistive devices, short distances in hall with walker for stability, and longer trips with his electric scooter. The last weight I have for him was 3 months earlier at 158lbs. At 5'9" his BMI was 23.5kg/m2. His sister believes he looks about the same weight. Patient is continent of bowel and bladder; continues occasional urinary incontinence.   3. Family Supports: Spouse (second marriage) deceased a few years ago;  unexpectedly d/t flu. He misses her. Sister [name] visits frequently with patient's little dog. I updated her with today's visit. Patient has children from first marriage not directly engaged.  Daughter [name] ([place]) in [place], Son [name] in [place].   4. Follow up Palliative Care Visit: 2-3 months.    I spent 30 minutes providing this consultation from 3pm-3:30pm. More than 50% of the time in this consultation was spent coordinating communication.    HISTORY OF PRESENT ILLNESS:  Mr. [name] is an 86 y.o. M with hx CVA, prosCa s/p radX, BPH,  Parkinsons disease (with dementia), CAD (STEMI 2019), hypothyroidism, HTN, DM (neuropathy), Afib (Eliquis) and CKD III (baseline creatine 1.3). Hydronephrosis / nephrolithiasis (stent placement. Prior episodes demand ischemia setting of Afib with RVR. 6/30-07/13/2019: Hospital admission: CP setting of afib with RVR. Treated with IV Cardizem to oral. Home health recommended. 12/30/2018: ER laceration over L eyebrow after a fall; sutured  12/22/2018: Hospital admission: R flank pain with hematuria; known R ureteral calculi with stent placed 1 yr ago. He had canceled his stone extraction. On 12/22/2018: R stent placement 11/1-11/02/2018: Hospital admission: treated for Hgb 6.8 (tx 1 unit PRBCs). Afib with RVR (converted to NSR). FOBN. Demand ischemia with elevation Troponin.   This is a f/u Palliative Care visit from 04/18/2019.    CODE STATUS: DNR   PPS: 60%   HOSPICE ELIGIBILITY/DIAGNOSIS: TBD  PAST MEDICAL HISTORY:  Past Medical History:  Diagnosis Date  .  Allergic rhinitis   . Arthritis    arthritis-kyphosis  . Asthmatic bronchitis   . CKD (chronic kidney disease) stage 3, GFR 30-59 ml/min   . DM type 2 (diabetes mellitus, type 2) ([place])   . DVT, lower extremity, proximal, acute ([place])   . Dyslipidemia   . GERD (gastroesophageal reflux disease)   . Hearing impaired    bilateral hearing aids  . History of kidney stones   . History  of skin cancer   . Hypertension   . Hypothyroidism   . Macular degeneration    injections done bilaterally recent - 08-22-13  . PAF (paroxysmal atrial fibrillation) ([place])   . Prostate cancer [facility])    Prostate cancer-radiation only,skin cancer-basal cell and last squamous cell right eye  . Stroke ([place])   . Urgency-frequency syndrome   . Urticaria     SOCIAL HX:  Social History   Tobacco Use  . Smoking status: Never Smoker  . Smokeless tobacco: Never Used  Substance Use Topics  . Alcohol use: No    ALLERGIES:  Allergies  Allergen Reactions  . Colchicine Anaphylaxis  . Hibiclens [Chlorhexidine] Hives  . Atorvastatin Other (See Comments)    Joint pain   . Ceftin Diarrhea  . Celebrex [Celecoxib] Swelling  . Codeine Hives  . Erythromycin Diarrhea  . Ezetimibe Other (See Comments)    Joint pain   . Iodinated Diagnostic Agents Nausea And Vomiting and Rash  . Oxycodone-Acetaminophen Hives  . Rosuvastatin Other (See Comments)    Joint pain   . Simvastatin Other (See Comments)    Joint pain   . Cefpodoxime Rash  . Cefuroxime Axetil Rash  . Nitroglycerin Other (See Comments)    Lowers patient's B/P  . Tetanus Toxoid Rash  . Vantin Other (See Comments)    Unknown/not noted on MAR??     PERTINENT MEDICATIONS:  Outpatient Encounter Medications as of 07/17/2019  Medication Sig  . alfuzosin (UROXATRAL) 10 MG 24 hr tablet Take 1 tablet (10 mg total) by mouth at bedtime.  . Alogliptin Benzoate 12.5 MG TABS Take 12.5 mg by mouth.   Marland Kitchen apixaban (ELIQUIS) 2.5 MG TABS tablet Take 2.5 mg by mouth 2 (two) times daily.  . benzonatate (TESSALON) 100 MG capsule Take 100 mg by mouth every 8 (eight) hours as needed for cough.   . brimonidine-timolol (COMBIGAN) 0.2-0.5 % ophthalmic solution Place 1 drop into both eyes 2 (two) times daily.  . brinzolamide (AZOPT) 1 % ophthalmic suspension Place 1 drop into the right eye 3 (three) times daily.   . carbidopa-levodopa (SINEMET IR) 25-100 MG tablet  Take 1 tablet by mouth 3 (three) times daily.   . Carboxymethylcellulose Sodium (ARTIFICIAL TEARS OP) Place 1 drop into both eyes 4 (four) times daily.  . Cholecalciferol (VITAMIN D-3) 5000 units TABS Take 5,000 Units by mouth daily.  . clopidogrel (PLAVIX) 75 MG tablet Take 75 mg by mouth daily.   . colesevelam (WELCHOL) 625 MG tablet Take 1,875 mg by mouth every evening.   . Cyanocobalamin (VITAMIN B-12) 2500 MCG SUBL Place 1 tablet under the tongue daily.  Marland Kitchen diltiazem (CARDIZEM CD) 120 MG 24 hr capsule Take 1 capsule (120 mg total) by mouth daily for 30 days.  Marland Kitchen docusate sodium (COLACE) 100 MG capsule Take 100 mg by mouth 2 (two) times daily.   . ferrous sulfate 325 (65 FE) MG EC tablet Take 1 tablet (325 mg total) by mouth 2 (two) times daily with a meal.  . fluticasone (  FLONASE) 50 MCG/ACT nasal spray Place 2 sprays into both nostrils daily.  . furosemide (LASIX) 20 MG tablet Take 20 mg by mouth daily.  Marland Kitchen latanoprost (XALATAN) 0.005 % ophthalmic solution Place 1 drop into the right eye at bedtime.   Marland Kitchen levocetirizine (XYZAL) 5 MG tablet Take 2.5 mg by mouth at bedtime.   Marland Kitchen levothyroxine (SYNTHROID) 150 MCG tablet Take 150 mcg by mouth daily.   . Melatonin 3 MG SUBL Place 3 mg under the tongue at bedtime.  . Menthol, Topical Analgesic, (ZIMS MAX-FREEZE EX) Apply 1 application topically every 8 (eight) hours as needed (pain). Apply to ankles  . menthol-cetylpyridinium (CEPACOL) 3 MG lozenge Take 1 lozenge by mouth every 4 (four) hours as needed for sore throat.   . Meth-Hyo-M Bl-Na Phos-Ph Sal (URO-MP) 118 MG CAPS Take 1 capsule by mouth 3 (three) times daily.  . metoprolol succinate (TOPROL-XL) 25 MG 24 hr tablet Take 0.5 tablets (12.5 mg total) by mouth daily for 30 days.  . mirabegron ER (MYRBETRIQ) 50 MG TB24 tablet Take 1 tablet (50 mg total) by mouth daily.  . Multiple Vitamins-Minerals (PRESERVISION/LUTEIN) CAPS Take 1 capsule by mouth 2 (two) times daily.  . pantoprazole (PROTONIX) 40  MG tablet Take 40 mg by mouth daily.  . polyethylene glycol (MIRALAX / GLYCOLAX) packet Take 17 g by mouth daily. [place]  . potassium chloride (K-DUR,KLOR-CON) 10 MEQ tablet Take 10 mEq by mouth daily. Dissolve in applesauce  . sertraline (ZOLOFT) 25 MG tablet Take 25 mg by mouth daily.  . traMADol (ULTRAM) 50 MG tablet Take 1 tablet (50 mg total) by mouth every 6 (six) hours as needed. (Patient taking differently: Take 50 mg by mouth every 6 (six) hours as needed for moderate pain. )   No facility-administered encounter medications on file as of 07/17/2019.    PHYSICAL EXAM:   97% RA, HR 72, RR 16 A & O x 3. Conversant. Engaged and pleasant General: NAD, frail appearing, slender sitting up in recline. PE deferred to limit COVID exposure Extremities: no edema, no joint deformities. Compression hose in place Skin: no rashes Neurological: Weakness but otherwise non-focal  Julianne Handler, NP [place]

## 2019-07-18 ENCOUNTER — Encounter: Payer: Self-pay | Admitting: Internal Medicine

## 2019-07-26 ENCOUNTER — Emergency Department (HOSPITAL_COMMUNITY)
Admission: EM | Admit: 2019-07-26 | Discharge: 2019-07-27 | Disposition: A | Discharge status: Home / Self Care | Payer: Medicare Other | Attending: Emergency Medicine | Admitting: Emergency Medicine

## 2019-07-26 ENCOUNTER — Encounter (HOSPITAL_COMMUNITY): Payer: Self-pay | Admitting: Emergency Medicine

## 2019-07-26 ENCOUNTER — Other Ambulatory Visit: Payer: Self-pay

## 2019-07-26 ENCOUNTER — Emergency Department (HOSPITAL_COMMUNITY): Payer: Medicare Other

## 2019-07-26 DIAGNOSIS — W19XXXA Unspecified fall, initial encounter: Secondary | ICD-10-CM | POA: Diagnosis not present

## 2019-07-26 DIAGNOSIS — E119 Type 2 diabetes mellitus without complications: Secondary | ICD-10-CM | POA: Diagnosis not present

## 2019-07-26 DIAGNOSIS — S61412A Laceration without foreign body of left hand, initial encounter: Secondary | ICD-10-CM | POA: Diagnosis not present

## 2019-07-26 DIAGNOSIS — Y929 Unspecified place or not applicable: Secondary | ICD-10-CM | POA: Insufficient documentation

## 2019-07-26 DIAGNOSIS — S0101XA Laceration without foreign body of scalp, initial encounter: Secondary | ICD-10-CM | POA: Insufficient documentation

## 2019-07-26 DIAGNOSIS — Y939 Activity, unspecified: Secondary | ICD-10-CM | POA: Insufficient documentation

## 2019-07-26 DIAGNOSIS — Y999 Unspecified external cause status: Secondary | ICD-10-CM | POA: Diagnosis not present

## 2019-07-26 MED ORDER — LIDOCAINE-EPINEPHRINE (PF) 2 %-1:200000 IJ SOLN
10.0000 mL | Freq: Once | INTRAMUSCULAR | Status: AC
  Administered 2019-07-26: 10 mL via INTRADERMAL
  Filled 2019-07-26: qty 20

## 2019-07-26 NOTE — ED Notes (Signed)
Wounds to scalp, L wrist cleansed. Steri strips applied to skin tear to L wrist and covered with nonadherent gauze. Tolerated well.

## 2019-07-26 NOTE — ED Provider Notes (Signed)
[name]   CSN: [id number] Arrival date & time: 07/26/19  2030     History Chief Complaint  Patient presents with  . Fall    [name] is a 86 y.o. male.  86 yo M with a cc of a fall.  Non syncopal by history.  Patient states that he went to go change the temperature on the thermostat and lost his balance and fell down.  Struck his head and his left hand.  Denies chest pain denies shortness of breath denies back pain abdominal pain lower extremity pain.  The history is provided by the patient.  Fall This is a new problem. The current episode started yesterday. The problem occurs constantly. The problem has not changed since onset.Pertinent negatives include no chest pain, no abdominal pain, no headaches and no shortness of breath. Nothing aggravates the symptoms. Nothing relieves the symptoms. He has tried nothing for the symptoms. The treatment provided no relief.       Past Medical History:  Diagnosis Date  . Diabetes mellitus without complication ([place])     There are no problems to display for this patient.   History reviewed. No pertinent surgical history.     No family history on file.  Social History   Tobacco Use  . Smoking status: Not on file  Substance Use Topics  . Alcohol use: Not on file  . Drug use: Not on file    Home Medications Prior to Admission medications   Not on File    Allergies    Cefuroxime, Celebrex [celecoxib], Codeine, Colchicine, Crestor [rosuvastatin], Erythromycin, Hibiclens [chlorhexidine], Lipitor [atorvastatin], Nitroglycerin, Percocet [oxycodone-acetaminophen], Protonix [pantoprazole], Tetanus toxoids, Zetia [ezetimibe], and Zocor [simvastatin]  Review of Systems   Review of Systems  Constitutional: Negative for chills and fever.  HENT: Negative for congestion and facial swelling.   Eyes: Negative for discharge and visual disturbance.  Respiratory: Negative for  shortness of breath.   Cardiovascular: Negative for chest pain and palpitations.  Gastrointestinal: Negative for abdominal pain, diarrhea and vomiting.  Musculoskeletal: Negative for arthralgias and myalgias.  Skin: Positive for wound. Negative for color change and rash.  Neurological: Negative for tremors, syncope and headaches.  Psychiatric/Behavioral: Negative for confusion and dysphoric mood.    Physical Exam Updated Vital Signs BP (!) 172/96 (BP Location: Right Arm)   Pulse 73   Temp 97.8 F (36.6 C) (Oral)   Resp 16   Ht 5\' 10"  (1.778 m)   Wt 71.2 kg   SpO2 100%   BMI 22.53 kg/m   Physical Exam Vitals and nursing note reviewed.  Constitutional:      Appearance: He is well-developed.  HENT:     Head: Normocephalic.     Comments: Two lacs to the top of the head.   Eyes:     Pupils: Pupils are equal, round, and reactive to light.  Neck:     Vascular: No JVD.  Cardiovascular:     Rate and Rhythm: Normal rate and regular rhythm.     Heart sounds: No murmur heard.  No friction rub. No gallop.   Pulmonary:     Effort: No respiratory distress.     Breath sounds: No wheezing.  Abdominal:     General: There is no distension.     Tenderness: There is no guarding or rebound.  Musculoskeletal:        General: Normal range of motion.     Cervical back: Normal range of  motion and neck supple.     Comments: Laceration and chronic deformity to the left wrist without bony tenderness.  Full ROM without pain.   Skin:    Coloration: Skin is not pale.     Findings: No rash.  Neurological:     Mental Status: He is alert and oriented to person, place, and time.  Psychiatric:        Behavior: Behavior normal.     ED Results / Procedures / Treatments   Labs (all labs ordered are listed, but only abnormal results are displayed) Labs Reviewed - No data to display  EKG EKG Interpretation  Date/Time:  Wednesday July 26 2019 20:45:17 EDT Ventricular Rate:  72 PR  Interval:  254 QRS Duration: 76 QT Interval:  366 QTC Calculation: 400 R Axis:   85 Text Interpretation: Sinus rhythm with 1st degree A-V block Septal infarct , age undetermined Abnormal ECG No old tracing to compare Confirmed by Deno Etienne [phone]) on 07/26/2019 9:24:26 PM   Radiology CT Head Wo Contrast  Result Date: 07/26/2019 CLINICAL DATA:  Head trauma EXAM: CT HEAD WITHOUT CONTRAST TECHNIQUE: Contiguous axial images were obtained from the base of the skull through the vertex without intravenous contrast. COMPARISON:  None. FINDINGS: Brain: Hypodensities are seen within the bilateral periventricular white matter, left insula, and bilateral basal ganglia. Findings are consistent with age-indeterminate small vessel ischemic change, favor chronic. No other signs of acute infarct or hemorrhage. The lateral ventricles and remaining midline structures are unremarkable. There are no acute extra-axial fluid collections. No mass effect. Diffuse cerebral atrophy is noted. Vascular: There is extensive atherosclerosis.  No hyperdense vessel. Skull: Normal. Negative for fracture or focal lesion. Sinuses/Orbits: No acute finding. Other: None. IMPRESSION: 1. Likely chronic small-vessel ischemic changes throughout the white matter, left insula, and bilateral basal ganglia. 2. No acute hemorrhage. Electronically Signed   By: Randa Ngo M.D.   On: 07/26/2019 21:29    Procedures .Marland KitchenLaceration Repair  Date/Time: 07/26/2019 11:31 PM Performed by: Deno Etienne, DO Authorized by: Deno Etienne, DO   Consent:    Consent obtained:  Verbal   Consent given by:  Patient   Risks discussed:  Infection, pain, poor cosmetic result and poor wound healing   Alternatives discussed:  No treatment, delayed treatment and observation Anesthesia (see MAR for exact dosages):    Anesthesia method:  Local infiltration   Local anesthetic:  Lidocaine 2% WITH epi Laceration details:    Location:  Scalp   Scalp location:  Crown    Length (cm):  2.7 Repair type:    Repair type:  Simple Exploration:    Hemostasis achieved with:  Epinephrine and direct pressure   Wound exploration: entire depth of wound probed and visualized     Wound extent: no underlying fracture noted     Contaminated: no   Treatment:    Area cleansed with:  Saline   Amount of cleaning:  Standard   Irrigation solution:  Sterile saline   Irrigation volume:  20   Irrigation method:  Syringe   Visualized foreign bodies/material removed: no   Skin repair:    Repair method:  Staples   Number of staples:  2 Approximation:    Approximation:  Close Post-procedure details:    Dressing:  Open (no dressing)   Patient tolerance of procedure:  Tolerated well, no immediate complications .Marland KitchenLaceration Repair  Date/Time: 07/26/2019 11:32 PM Performed by: Deno Etienne, DO Authorized by: Deno Etienne, DO   Consent:  Consent obtained:  Verbal   Consent given by:  Patient   Risks discussed:  Infection, pain, poor cosmetic result and poor wound healing   Alternatives discussed:  No treatment, delayed treatment and observation Anesthesia (see MAR for exact dosages):    Anesthesia method:  Local infiltration   Local anesthetic:  Lidocaine 2% WITH epi Laceration details:    Location:  Scalp   Scalp location:  Crown   Length (cm):  1 Repair type:    Repair type:  Simple Pre-procedure details:    Preparation:  Patient was prepped and draped in usual sterile fashion Exploration:    Hemostasis achieved with:  Direct pressure and epinephrine   Wound exploration: entire depth of wound probed and visualized     Wound extent: no underlying fracture noted     Contaminated: no   Treatment:    Area cleansed with:  Saline   Amount of cleaning:  Extensive   Irrigation solution:  Sterile saline   Irrigation volume:  20   Irrigation method:  Syringe   Visualized foreign bodies/material removed: no   Skin repair:    Repair method:  Staples   Number of staples:   1 Approximation:    Approximation:  Close Post-procedure details:    Dressing:  Open (no dressing)   Patient tolerance of procedure:  Tolerated well, no immediate complications   (including critical care time)  Medications Ordered in ED Medications  lidocaine-EPINEPHrine (XYLOCAINE W/EPI) 2 %-1:200000 (PF) injection 10 mL (10 mLs Intradermal Given by Other 07/26/19 2159)    ED Course  I have reviewed the triage vital signs and the nursing notes.  Pertinent labs & imaging results that were available during my care of the patient were reviewed by me and considered in my medical decision making (see chart for details).    MDM Rules/Calculators/A&P                          86 yo M with a cc of fall.  Non syncopal by history.  Two lacs to the top of the head.  Stapled.  Steri strip to hands.  CT head negative.  D/c home.   11:33 PM:  I have discussed the diagnosis/risks/treatment options with the patient and family and believe the pt to be eligible for discharge home to follow-up with PCP. We also discussed returning to the ED immediately if new or worsening sx occur. We discussed the sx which are most concerning (e.g., sudden worsening pain, fever, inability to tolerate by mouth, redness, drainage, fever) that necessitate immediate return. Medications administered to the patient during their visit and any new prescriptions provided to the patient are listed below.  Medications given during this visit Medications  lidocaine-EPINEPHrine (XYLOCAINE W/EPI) 2 %-1:200000 (PF) injection 10 mL (10 mLs Intradermal Given by Other 07/26/19 2159)     The patient appears reasonably screen and/or stabilized for discharge and I doubt any other medical condition or other [facility] requiring further screening, evaluation, or treatment in the ED at this time prior to discharge.   Final Clinical Impression(s) / ED Diagnoses Final diagnoses:  Fall, initial encounter  Scalp laceration, initial encounter   Laceration of left hand, foreign body presence unspecified, initial encounter    Rx / DC Orders ED Discharge Orders    None       Deno Etienne, DO 07/26/19 2333

## 2019-07-26 NOTE — Progress Notes (Signed)
Chaplain offered support for this patient who arrived as a level II fall.  Patient has been evaluated and received medical care and will return to facility.  Patient said the fall happened so fast he is not sure exactly what happened, but is thankful to be ok and going back to his facility. Chaplain provided support at the bedside.  Chaplain available as needed Geraldine, MDiv.    07/26/19 2200  Clinical Encounter Type  Visited With Patient  Visit Type Trauma  Referral From Nurse  Consult/Referral To Chaplain  Spiritual Encounters  Spiritual Needs Emotional

## 2019-07-26 NOTE — Progress Notes (Signed)
Orthopedic Tech Progress Note Patient Details:  EASTEN MACEACHERN 05/26/1931 [id number] Level 2 Trauma  Patient ID: [name], male   DOB: 1931/11/21, 86 y.o.   MRN: [id number]   Jearld Lesch 07/26/2019, 9:33 PM

## 2019-07-26 NOTE — ED Triage Notes (Signed)
Pt presents from [place] for fall just pta, on eliquis. A&Ox4 at baseline with EMS, lacs to scalp and L wrist. Denies pain, LOC. Remembers falls, states lost balance.

## 2019-07-26 NOTE — ED Notes (Signed)
[facility] called to transport pt 

## 2019-07-26 NOTE — ED Notes (Signed)
Report called to [facility] at [facility]. Pt uses personal phone to update family

## 2019-07-26 NOTE — Discharge Instructions (Signed)
Follow-up in 5 days to have your staples removed this can be done here at urgent care or family doctor's office.  Please return for redness drainage or fever.  This area can get wet but do not fully immerse it underwater.  No scrubbing.

## 2019-07-26 NOTE — ED Notes (Signed)
EDP at bedside to close wound to scalp with staples

## 2019-07-27 ENCOUNTER — Encounter: Payer: Self-pay | Admitting: Podiatry

## 2019-07-27 ENCOUNTER — Ambulatory Visit (INDEPENDENT_AMBULATORY_CARE_PROVIDER_SITE_OTHER): Payer: Medicare Other | Admitting: Podiatry

## 2019-07-27 ENCOUNTER — Encounter: Payer: Self-pay | Admitting: Internal Medicine

## 2019-07-27 VITALS — Temp 98.1°F

## 2019-07-27 DIAGNOSIS — E1122 Type 2 diabetes mellitus with diabetic chronic kidney disease: Secondary | ICD-10-CM

## 2019-07-27 DIAGNOSIS — M79674 Pain in right toe(s): Secondary | ICD-10-CM

## 2019-07-27 DIAGNOSIS — B351 Tinea unguium: Secondary | ICD-10-CM | POA: Diagnosis not present

## 2019-07-27 DIAGNOSIS — M79675 Pain in left toe(s): Secondary | ICD-10-CM | POA: Diagnosis not present

## 2019-07-27 DIAGNOSIS — Q828 Other specified congenital malformations of skin: Secondary | ICD-10-CM

## 2019-07-27 DIAGNOSIS — N183 Chronic kidney disease, stage 3 unspecified: Secondary | ICD-10-CM | POA: Diagnosis not present

## 2019-07-27 NOTE — Progress Notes (Signed)
Subjective:   Patient ID: [name], male   DOB: 86 y.o.   MRN: [id number]   HPI Patient had previous injury to his left head from fall and has nail disease and painful lesions and is a diabetic   ROS      Objective:  Physical Exam  Neurovascular status unchanged with thick yellow brittle nailbeds 1-5 both feet that are painful and lesions on the second and third digits distal growth diminished regular sharp dull vibratory and long-term diabetic condition with poor health individual     Assessment:  At risk patient with keratotic lesion they cannot take care of that are painful x2 left along with diabetes with high risk and mycotic nail infection with pain 1-5 both feet     Plan:  Debridement of nailbeds 1-5 both feet neurogenic bleeding debridement of lesions left no iatrogenic bleeding tolerated well reappoint as needed

## 2019-08-01 ENCOUNTER — Emergency Department (HOSPITAL_COMMUNITY): Payer: Medicare Other

## 2019-08-01 ENCOUNTER — Emergency Department (HOSPITAL_COMMUNITY)
Admission: EM | Admit: 2019-08-01 | Discharge: 2019-08-01 | Disposition: A | Discharge status: Home / Self Care | Payer: Medicare Other | Source: Home / Self Care | Attending: Emergency Medicine | Admitting: Emergency Medicine

## 2019-08-01 ENCOUNTER — Other Ambulatory Visit: Payer: Self-pay

## 2019-08-01 DIAGNOSIS — Z4802 Encounter for removal of sutures: Secondary | ICD-10-CM | POA: Insufficient documentation

## 2019-08-01 DIAGNOSIS — I4891 Unspecified atrial fibrillation: Secondary | ICD-10-CM | POA: Insufficient documentation

## 2019-08-01 DIAGNOSIS — E119 Type 2 diabetes mellitus without complications: Secondary | ICD-10-CM | POA: Insufficient documentation

## 2019-08-01 DIAGNOSIS — I129 Hypertensive chronic kidney disease with stage 1 through stage 4 chronic kidney disease, or unspecified chronic kidney disease: Secondary | ICD-10-CM | POA: Insufficient documentation

## 2019-08-01 DIAGNOSIS — I2510 Atherosclerotic heart disease of native coronary artery without angina pectoris: Secondary | ICD-10-CM | POA: Insufficient documentation

## 2019-08-01 DIAGNOSIS — J45998 Other asthma: Secondary | ICD-10-CM | POA: Insufficient documentation

## 2019-08-01 DIAGNOSIS — E039 Hypothyroidism, unspecified: Secondary | ICD-10-CM | POA: Insufficient documentation

## 2019-08-01 DIAGNOSIS — Z7901 Long term (current) use of anticoagulants: Secondary | ICD-10-CM | POA: Insufficient documentation

## 2019-08-01 DIAGNOSIS — Z96653 Presence of artificial knee joint, bilateral: Secondary | ICD-10-CM | POA: Insufficient documentation

## 2019-08-01 DIAGNOSIS — N1831 Chronic kidney disease, stage 3a: Secondary | ICD-10-CM | POA: Insufficient documentation

## 2019-08-01 DIAGNOSIS — Z79899 Other long term (current) drug therapy: Secondary | ICD-10-CM | POA: Insufficient documentation

## 2019-08-01 LAB — CBC WITH DIFFERENTIAL/PLATELET
Abs Immature Granulocytes: 0.03 10*3/uL (ref 0.00–0.07)
Basophils Absolute: 0.1 10*3/uL (ref 0.0–0.1)
Basophils Relative: 1 %
Eosinophils Absolute: 0.3 10*3/uL (ref 0.0–0.5)
Eosinophils Relative: 4 %
HCT: 37.6 % — ABNORMAL LOW (ref 39.0–52.0)
Hemoglobin: 11.9 g/dL — ABNORMAL LOW (ref 13.0–17.0)
Immature Granulocytes: 1 %
Lymphocytes Relative: 22 %
Lymphs Abs: 1.4 10*3/uL (ref 0.7–4.0)
MCH: 31.2 pg (ref 26.0–34.0)
MCHC: 31.6 g/dL (ref 30.0–36.0)
MCV: 98.4 fL (ref 80.0–100.0)
Monocytes Absolute: 0.8 10*3/uL (ref 0.1–1.0)
Monocytes Relative: 12 %
Neutro Abs: 3.9 10*3/uL (ref 1.7–7.7)
Neutrophils Relative %: 60 %
Platelets: 186 10*3/uL (ref 150–400)
RBC: 3.82 MIL/uL — ABNORMAL LOW (ref 4.22–5.81)
RDW: 12.2 % (ref 11.5–15.5)
WBC: 6.5 10*3/uL (ref 4.0–10.5)
nRBC: 0 % (ref 0.0–0.2)

## 2019-08-01 LAB — COMPREHENSIVE METABOLIC PANEL
ALT: <5 U/L (ref 0–44)
AST: 8 U/L — ABNORMAL LOW (ref 15–41)
Albumin: 3.3 g/dL — ABNORMAL LOW (ref 3.5–5.0)
Alkaline Phosphatase: 70 U/L (ref 38–126)
Anion gap: 8 (ref 5–15)
BUN: 30 mg/dL — ABNORMAL HIGH (ref 8–23)
CO2: 23 mmol/L (ref 22–32)
Calcium: 8.4 mg/dL — ABNORMAL LOW (ref 8.9–10.3)
Chloride: 109 mmol/L (ref 98–111)
Creatinine, Ser: 1.68 mg/dL — ABNORMAL HIGH (ref 0.61–1.24)
GFR calc Af Amer: 42 mL/min — ABNORMAL LOW (ref >60)
GFR calc non Af Amer: 36 mL/min — ABNORMAL LOW (ref >60)
Glucose, Bld: 118 mg/dL — ABNORMAL HIGH (ref 70–99)
Potassium: 4.3 mmol/L (ref 3.5–5.1)
Sodium: 140 mmol/L (ref 135–145)
Total Bilirubin: 0.8 mg/dL (ref 0.3–1.2)
Total Protein: 5.8 g/dL — ABNORMAL LOW (ref 6.5–8.1)

## 2019-08-01 LAB — URINALYSIS, ROUTINE W REFLEX MICROSCOPIC
Bilirubin Urine: NEGATIVE
Glucose, UA: NEGATIVE mg/dL
Hgb urine dipstick: NEGATIVE
Ketones, ur: NEGATIVE mg/dL
Nitrite: NEGATIVE
Protein, ur: NEGATIVE mg/dL
Specific Gravity, Urine: 1.013 (ref 1.005–1.030)
pH: 5 (ref 5.0–8.0)

## 2019-08-01 LAB — TROPONIN I (HIGH SENSITIVITY)
Troponin I (High Sensitivity): 13 ng/L (ref <18)
Troponin I (High Sensitivity): 20 ng/L — ABNORMAL HIGH (ref <18)

## 2019-08-01 LAB — CBG MONITORING, ED: Glucose-Capillary: 101 mg/dL — ABNORMAL HIGH (ref 70–99)

## 2019-08-01 NOTE — ED Notes (Signed)
Urine culture attached to urine sample 

## 2019-08-01 NOTE — ED Provider Notes (Signed)
[facility] EMERGENCY DEPARTMENT Provider Note   CSN: [id number] Arrival date & time: 08/01/19  1914     History No chief complaint on file.   [name] is a 86 y.o. male.  HPI  86 year old male with a history of CKD stage III, DM type II, DVTs, GERD, hypothyroidism, hypertension, paroxysmal A. fib, prostate cancer presents to the ER via EMS for A. fib with RVR.  History provided by the patient and by nursing staff at [facility] facility.  Nursing staff reports that the patient was out in the hallway and is electric wheelchair waiting for the tech to give him his medications, when he suddenly became pale, diaphoretic, dizzy, lethargic.  She states she physically palpated a blood pressure of 78, respiratory rate at the time of the incident was between 22-24, O2 sats were 97.  He has a history of A. fib with RVR and is on Eliquis.  EMS was called after this incident.  He denies any chest pain or shortness of breath, dizziness, fevers, dysuria, cough, abdominal pain.  Patient is a slightly questionable historian, is alert to person place and time, but not exactly sure as to why he is in the ER.  Arrived to the ED in A. fib with RVR between 100-120.  Nursing staff noted that he has staples in his head from his fall on 7/14 where he was seen here.  Per chart review, patient had a similar episode of A. fib with RVR on 07/12/2019 and had been placed on a Cardizem drip and admitted for 2 days, discharged on 7/1.He is on 120 mg of Cardizem, nursing staff reports compliance.    Past Medical History:  Diagnosis Date  . Allergic rhinitis   . Arthritis    arthritis-kyphosis  . Asthmatic bronchitis   . CKD (chronic kidney disease) stage 3, GFR 30-59 ml/min   . Diabetes mellitus without complication ([place])   . DM type 2 (diabetes mellitus, type 2) ([place])   . DVT, lower extremity, proximal, acute ([place])   . Dyslipidemia   . GERD (gastroesophageal reflux disease)   .  Hearing impaired    bilateral hearing aids  . History of kidney stones   . History of skin cancer   . Hypertension   . Hypothyroidism   . Macular degeneration    injections done bilaterally recent - 08-22-13  . PAF (paroxysmal atrial fibrillation) ([place])   . Prostate cancer [facility])    Prostate cancer-radiation only,skin cancer-basal cell and last squamous cell right eye  . Stroke ([place])   . Urgency-frequency syndrome   . Urticaria     Patient Active Problem List   Diagnosis Date Noted  . Symptomatic anemia 11/13/2018  . Parkinson's disease ([place]) 11/13/2018  . AF (paroxysmal atrial fibrillation) ([place])   . Coronary artery disease involving native coronary artery of native heart without angina pectoris   . Hyperlipidemia LDL goal <70   . Atrial fibrillation with RVR ([place]) 08/14/2017  . Urinary tract infection with hematuria   . Non-ST elevation (NSTEMI) myocardial infarction ([place])   . GERD (gastroesophageal reflux disease) 08/04/2017  . Elevated troponin 08/03/2017  . Chest pain 08/03/2017  . CKD (chronic kidney disease), stage III 08/03/2017  . Type II diabetes mellitus with renal manifestations ([place]) 08/03/2017  . Pseudophakia of both eyes 05/31/2017  . Retained ureteral stent 04/22/2016  . Ureteral calculus 01/25/2016  . Benign essential HTN 10/24/2015  . CVA (cerebral vascular accident) ([place]) 10/14/2014  .  TIA (transient ischemic attack)   . CME (cystoid macular edema), left 08/10/2013  . Squamous cell carcinoma of eyelid 05/15/2013  . Blepharitis 03/16/2013  . Posterior vitreous detachment of right eye 10/23/2011  . Exudative age-related macular degeneration ([place]) 07/28/2011  . Nonproliferative diabetic retinopathy ([place]) 04/07/2011  . Insomnia, unspecified 05/19/2010  . ATAXIA 03/17/2010  . DIZZINESS 03/06/2010  . Chronic constipation 02/21/2008  . OSTEOARTHRITIS, KNEE, SEVERE 01/12/2008  . Hypothyroidism (post surgical) 10/15/2006  . Diabetes mellitus, insulin dependent  (IDDM), controlled 04/22/2006  . URTICARIA 04/22/2006  . POPLITEAL CYST 04/22/2006  . THYROIDECTOMY, SUBTOTAL, HX OF 04/22/2006  . TOTAL KNEE REPLACEMENT, HX OF 04/22/2006    Past Surgical History:  Procedure Laterality Date  . BACK SURGERY     lumbar fracture  . cataract surgery Bilateral   . COLONOSCOPY W/ POLYPECTOMY     x2   . CORONARY STENT INTERVENTION N/A 08/06/2017   Procedure: CORONARY STENT INTERVENTION;  Surgeon: Jettie Booze, MD;  Location: [place] [facility];  Service: Cardiovascular;  Laterality: N/A;  . CYSTOSCOPY W/ URETERAL STENT PLACEMENT Right 08/13/2017   Procedure: CYSTOSCOPY WITH RIGHT  RETROGRADE PYELOGRAM/URETERAL STENT PLACEMENT;  Surgeon: Raynelle Bring, MD;  Location: [facility];  Service: Urology;  Laterality: Right;  . CYSTOSCOPY WITH INJECTION N/A 09/11/2013   Procedure: CYSTOSCOPY WITH BOTOX INJECTION INTO THE BLADDER;  Surgeon: Ailene Rud, MD;  Location: [facility];  Service: Urology;  Laterality: N/A;  . CYSTOSCOPY WITH RETROGRADE PYELOGRAM, URETEROSCOPY AND STENT PLACEMENT    . CYSTOSCOPY WITH RETROGRADE PYELOGRAM, URETEROSCOPY AND STENT PLACEMENT Right 12/22/2018   Procedure: CYSTOSCOPY WITH RETROGRADE PYELOGRAM, URETEROSCOPY AND STENT PLACEMENT;  Surgeon: Cleon Gustin, MD;  Location: [facility];  Service: Urology;  Laterality: Right;  90 MINS  . CYSTOSCOPY WITH STENT PLACEMENT Right 01/26/2016   Procedure: CYSTOSCOPY, RIGHT RETROGRADE WITH RIGHT URETERAL STENT PLACEMENT;  Surgeon: Cleon Gustin, MD;  Location: [facility];  Service: Urology;  Laterality: Right;  . CYSTOSCOPY/RETROGRADE/URETEROSCOPY Right 04/22/2016   Procedure: CYSTOSCOPY/RETROGRADE/ REMOVAL JJ STENT right insertion stent right insertion of foley;  Surgeon: Franchot Gallo, MD;  Location: [facility];  Service: Urology;  Laterality: Right;  . EXTRACORPOREAL SHOCK WAVE LITHOTRIPSY Right 02/27/2016   Procedure: RIGHT EXTRACORPOREAL SHOCK WAVE LITHOTRIPSY (ESWL) GAITED;  Surgeon:  Cleon Gustin, MD;  Location: [facility];  Service: Urology;  Laterality: Right;  . EXTRACORPOREAL SHOCK WAVE LITHOTRIPSY Right 02/10/2016   Procedure: EXTRACORPOREAL SHOCK WAVE LITHOTRIPSY (ESWL);  Surgeon: Kathie Rhodes, MD;  Location: [facility];  Service: Urology;  Laterality: Right;  [phone] [id number] BCBS- [id number]   . EYE SURGERY     macular degeneration  . GALLBLADDER SURGERY  2006   no cholecystectomy  . HERNIA REPAIR    . HOLMIUM LASER APPLICATION Right 08/25/4816   Procedure: HOLMIUM LASER APPLICATION;  Surgeon: Cleon Gustin, MD;  Location: [facility];  Service: Urology;  Laterality: Right;  . LEFT HEART CATH AND CORONARY ANGIOGRAPHY N/A 08/06/2017   Procedure: LEFT HEART CATH AND CORONARY ANGIOGRAPHY;  Surgeon: Jettie Booze, MD;  Location: [place] [facility];  Service: Cardiovascular;  Laterality: N/A;  . LUNG REMOVAL, PARTIAL  age 44   for bronchiectasis  . PARTIAL THYMECTOMY  1985   for nodules  . POPLITEAL SYNOVIAL CYST EXCISION  2005  . TOTAL KNEE ARTHROPLASTY  2008   right  . TOTAL KNEE ARTHROPLASTY  2010   left       Family History  Problem Relation Age  of Onset  . Other Sister        ophth disease  . Heart attack Father        x7  . Heart disease Father        MI x7  . Hypertension Mother   . Stroke Mother 20  . Amblyopia Neg Hx   . Blindness Neg Hx   . Glaucoma Neg Hx   . Macular degeneration Neg Hx   . Retinal detachment Neg Hx   . Strabismus Neg Hx   . Retinitis pigmentosa Neg Hx   . Cataracts Neg Hx     Social History   Tobacco Use  . Smoking status: Never Smoker  . Smokeless tobacco: Never Used  Substance Use Topics  . Alcohol use: No  . Drug use: No    Home Medications Prior to Admission medications   Medication Sig Start Date End Date Taking? Authorizing Provider  alfuzosin (UROXATRAL) 10 MG 24 hr tablet Take 1 tablet (10 mg total) by mouth at bedtime. 02/10/16  Yes Kathie Rhodes, MD    Alogliptin Benzoate 12.5 MG TABS Take 12.5 mg by mouth daily.    Yes [provider]  ammonium lactate (LAC-HYDRIN) 12 % lotion Apply 1 application topically daily. Apply to feet   Yes [provider]  apixaban (ELIQUIS) 2.5 MG TABS tablet Take 2.5 mg by mouth 2 (two) times daily.   Yes [provider]  benzonatate (TESSALON) 100 MG capsule Take 100 mg by mouth every 8 (eight) hours as needed for cough.    Yes [provider]  brimonidine-timolol (COMBIGAN) 0.2-0.5 % ophthalmic solution Place 1 drop into both eyes 2 (two) times daily.   Yes [provider]  brinzolamide (AZOPT) 1 % ophthalmic suspension Place 1 drop into the right eye 3 (three) times daily.  09/03/16  Yes [provider]  carbidopa-levodopa (SINEMET IR) 25-100 MG tablet Take 1 tablet by mouth 3 (three) times daily.    Yes [provider]  Carboxymethylcellulose Sodium (ARTIFICIAL TEARS OP) Place 1 drop into both eyes 4 (four) times daily.   Yes [provider]  Cholecalciferol (VITAMIN D-3) 5000 units TABS Take 5,000 Units by mouth daily.   Yes [provider]  clopidogrel (PLAVIX) 75 MG tablet Take 75 mg by mouth daily.    Yes [provider]  colesevelam (WELCHOL) 625 MG tablet Take 1,875 mg by mouth every evening.    Yes [provider]  Cyanocobalamin (VITAMIN B-12) 2500 MCG SUBL Place 1 tablet under the tongue daily.   Yes [provider]  diltiazem (CARDIZEM CD) 120 MG 24 hr capsule Take 1 capsule (120 mg total) by mouth daily for 30 days. 03/12/18  Yes Elodia Florence., MD  docusate sodium (COLACE) 100 MG capsule Take 100 mg by mouth 2 (two) times daily.    Yes [provider]  ferrous sulfate 325 (65 FE) MG EC tablet Take 1 tablet (325 mg total) by mouth 2 (two) times daily with a meal. 11/14/18 11/14/19 Yes Danford, Suann Larry, MD  fluticasone (FLONASE) 50 MCG/ACT nasal spray Place 2 sprays into both  nostrils daily.   Yes [provider]  furosemide (LASIX) 20 MG tablet Take 20 mg by mouth daily.   Yes [provider]  latanoprost (XALATAN) 0.005 % ophthalmic solution Place 1 drop into the right eye at bedtime.  07/28/16  Yes [provider]  levocetirizine (XYZAL) 5 MG tablet Take 2.5 mg by mouth at  bedtime.  04/15/18  Yes [provider]  levothyroxine (SYNTHROID) 150 MCG tablet Take 150 mcg by mouth daily.    Yes [provider]  Melatonin 3 MG SUBL Place 3 mg under the tongue at bedtime. 11/11/18  Yes [provider]  Meth-Hyo-M Bl-Na Phos-Ph Sal (URO-MP) 118 MG CAPS Take 1 capsule by mouth 3 (three) times daily.   Yes [provider]  metoprolol succinate (TOPROL-XL) 25 MG 24 hr tablet Take 0.5 tablets (12.5 mg total) by mouth daily for 30 days. Patient taking differently: Take 25 mg by mouth daily. Hold if HR is < 50 03/12/18  Yes Elodia Florence., MD  mirabegron ER (MYRBETRIQ) 50 MG TB24 tablet Take 1 tablet (50 mg total) by mouth daily. 01/28/16  Yes McKenzie, Candee Furbish, MD  Multiple Vitamins-Minerals (PRESERVISION/LUTEIN) CAPS Take 1 capsule by mouth 2 (two) times daily.   Yes [provider]  pantoprazole (PROTONIX) 40 MG tablet Take 40 mg by mouth daily. 06/29/19  Yes [provider]  polyethylene glycol (MIRALAX / GLYCOLAX) packet Take 17 g by mouth daily. [place]   Yes [provider]  potassium chloride (K-DUR,KLOR-CON) 10 MEQ tablet Take 10 mEq by mouth daily. Dissolve in applesauce   Yes [provider]  sertraline (ZOLOFT) 25 MG tablet Take 25 mg by mouth daily.   Yes [provider]  traMADol (ULTRAM) 50 MG tablet Take 1 tablet (50 mg total) by mouth every 6 (six) hours as needed. Patient taking differently: Take 50 mg by mouth every 6 (six) hours as needed for moderate pain.  12/22/18 12/22/19 Yes McKenzie, Candee Furbish, MD    Allergies    Colchicine, Hibiclens  [chlorhexidine], Atorvastatin, Ceftin, Celebrex [celecoxib], Codeine, Erythromycin, Ezetimibe, Iodinated diagnostic agents, Oxycodone-acetaminophen, Rosuvastatin, Simvastatin, Cefuroxime, Celebrex [celecoxib], Codeine, Colchicine, Crestor [rosuvastatin], Erythromycin, Hibiclens [chlorhexidine], Lipitor [atorvastatin], Nitroglycerin, Percocet [oxycodone-acetaminophen], Protonix [pantoprazole], Tetanus toxoids, Zetia [ezetimibe], Zocor [simvastatin], Cefpodoxime, Cefuroxime axetil, Nitroglycerin, Tetanus toxoid, and Vantin  Review of Systems   Review of Systems  Constitutional: Negative for chills and fever.  HENT: Negative for ear pain and sore throat.   Eyes: Negative for pain and visual disturbance.  Respiratory: Negative for cough and shortness of breath.   Cardiovascular: Negative for chest pain and palpitations.  Gastrointestinal: Negative for abdominal pain and vomiting.  Genitourinary: Negative for dysuria and hematuria.  Musculoskeletal: Negative for arthralgias and back pain.  Skin: Negative for color change and rash.  Neurological: Negative for seizures and syncope.  All other systems reviewed and are negative.   Physical Exam Updated Vital Signs BP (!) 173/98   Pulse 68   Temp 98 F (36.7 C) (Oral)   Resp 15   Ht 5\' 10"  (1.778 m)   Wt 65.8 kg   SpO2 100%   BMI 20.81 kg/m   Physical Exam Vitals reviewed.  Constitutional:      General: He is not in acute distress.    Appearance: Normal appearance. He is not ill-appearing or toxic-appearing.  HENT:     Head: Normocephalic and atraumatic.     Mouth/Throat:     Mouth: Mucous membranes are moist.  Eyes:     General:        Right eye: No discharge.        Left eye: No discharge.     Extraocular Movements: Extraocular movements intact.     Conjunctiva/sclera: Conjunctivae normal.  Cardiovascular:     Rate and Rhythm: Tachycardia present. Rhythm irregular.  Pulses: Normal pulses.     Heart sounds: Normal heart  sounds.  Pulmonary:     Effort: Pulmonary effort is normal.     Breath sounds: Normal breath sounds.  Abdominal:     General: Abdomen is flat.     Tenderness: There is no abdominal tenderness. There is no guarding.  Musculoskeletal:        General: No swelling. Normal range of motion.  Skin:    General: Skin is warm and dry.     Comments: Old posterior scalp laceration with 4 staples  Neurological:     General: No focal deficit present.     Mental Status: He is alert and oriented to person, place, and time.  Psychiatric:        Mood and Affect: Mood normal.        Behavior: Behavior normal.     ED Results / Procedures / Treatments   Labs (all labs ordered are listed, but only abnormal results are displayed) Labs Reviewed  CBC WITH DIFFERENTIAL/PLATELET - Abnormal; Notable for the following components:      Result Value   RBC 3.82 (*)    Hemoglobin 11.9 (*)    HCT 37.6 (*)    All other components within normal limits  COMPREHENSIVE METABOLIC PANEL - Abnormal; Notable for the following components:   Glucose, Bld 118 (*)    BUN 30 (*)    Creatinine, Ser 1.68 (*)    Calcium 8.4 (*)    Total Protein 5.8 (*)    Albumin 3.3 (*)    AST 8 (*)    GFR calc non Af Amer 36 (*)    GFR calc Af Amer 42 (*)    All other components within normal limits  URINALYSIS, ROUTINE W REFLEX MICROSCOPIC - Abnormal; Notable for the following components:   Color, Urine GREEN (*)    APPearance HAZY (*)    Leukocytes,Ua SMALL (*)    Bacteria, UA RARE (*)    All other components within normal limits  CBG MONITORING, ED - Abnormal; Notable for the following components:   Glucose-Capillary 101 (*)    All other components within normal limits  TROPONIN I (HIGH SENSITIVITY) - Abnormal; Notable for the following components:   Troponin I (High Sensitivity) 20 (*)    All other components within normal limits  URINE CULTURE  TROPONIN I (HIGH SENSITIVITY)    EKG EKG  Interpretation  Date/Time:  Tuesday August 01 2019 09:47:35 EDT Ventricular Rate:  69 PR Interval:    QRS Duration: 83 QT Interval:  385 QTC Calculation: 413 R Axis:   64 Text Interpretation: Sinus arrhythmia Prolonged PR interval Anteroseptal infarct, age indeterminate a fib resolved Confirmed by Dorie Rank [phone]) on 08/01/2019 10:05:01 AM   Radiology DG Chest Portable 1 View  Result Date: 08/01/2019 CLINICAL DATA:  AFib EXAM: PORTABLE CHEST 1 VIEW COMPARISON:  July 12, 2019, November 13, 2018, August 13, 2017 FINDINGS: The cardiomediastinal silhouette is unchanged in contour. Moderate hiatal hernia. Atherosclerotic calcifications. No pleural effusion. No pneumothorax. Unchanged bibasilar interstitial prominence with unchanged bibasilar heterogeneous opacities, likely chronic scarring/sequela of chronic aspiration. Visualized abdomen is unremarkable. Degenerative changes of the thoracic spine. IMPRESSION: 1. Moderate hiatal hernia. 2. Unchanged bibasilar scarring/sequela of chronic aspiration. Electronically Signed   By: Valentino Saxon MD   On: 08/01/2019 09:01    Procedures .Suture Removal  Date/Time: 08/01/2019 2:20 PM Performed by: Garald Balding, PA-C Authorized by: Garald Balding, PA-C   Consent:  Consent obtained:  Verbal   Consent given by:  Patient   Alternatives discussed:  No treatment Location:    Location:  [place]   Head/neck location:  Scalp Procedure details:    Wound appearance:  No signs of infection   Number of staples removed:  4 Post-procedure details:    Post-removal:  No dressing applied   Patient tolerance of procedure:  Tolerated well, no immediate complications   (including critical care time)  Medications Ordered in ED Medications - No data to display  ED Course  I have reviewed the triage vital signs and the nursing notes.  Pertinent labs & imaging results that were available during my care of the patient were reviewed by me and  considered in my medical decision making (see chart for details).    MDM Rules/Calculators/A&P                         86 year old male presents with A. fib with RVR with rate between 100-120 On presentation, the patient is alert, oriented to person place and time, nontoxic-appearing, no acute distress.  On arrival, this heart rate was between 100-120 with his blood pressure 111/70.  Approximately 20 minutes into the ED course, he ended up self converting, rates now 68-70.  He is slightly more hypertensive now with blood pressures of 173/98.  Physical exam without any acute abnormalities.  He is anticoagulated on Eliquis and is on 120 of Cardizem and 25 of metoprolol.  Patient denies any dysuria, chest pain, shortness of breath.  Labs personally reviewed by myself, CMP without any significant electrolyte abnormalities, creatinine of 1.68 which is slightly increased from 2 weeks ago with levels of 1.44.  Calcium of 8.4.  CBC without leukocytosis, hemoglobin of 11.9.  Urine green, per chart review he is on uro-MP which contains methylene blue.  No clear evidence of UTI or blood.  Chest x-ray without any acute changes.  Most notably, his delta troponin went from 13-20.  Heart score of 3, heart score recommend admission for observation.  Consult cardiology pending to determine best his course.  Overall, the patient is not complaining any chest pain or shortness of breath at this time.  Spoke with Dr. Oval Linsey with cardiology, given that the patient is not having any chest pain at this time, she feels as though he is stable for discharge.  They will adjust his metoprolol and get him set up with A. fib clinic.  Patient has remained in normal sinus rhythm, and hemodynamically stable throughout the ED course.  I discussed this with the patient who is overall reassured.  At this stage in the ED course, the patient is medically screened and is stable for discharge.   Final Clinical Impression(s) / ED  Diagnoses Final diagnoses:  Atrial fibrillation with rapid ventricular response [facility])    Rx / DC Orders ED Discharge Orders    None       Lyndel Safe 08/01/19 1511    Dorie Rank, MD 08/01/19 1538

## 2019-08-01 NOTE — ED Triage Notes (Signed)
BIB by [facility] pt lightheaded upon going to pharmacy this morning. Pt in Afib w/ RVR per EMS. No complaints of SoB or pain. Hypotensive initially, given 500 NS and 10 mg Cardiazem. Pulse 90-170s. After fluid bolus BP up to 354T systolic per EMS

## 2019-08-01 NOTE — ED Notes (Signed)
Ptar called by dee pt is number  8 on the list to be picked up

## 2019-08-01 NOTE — Discharge Instructions (Signed)
Your work-up today was overall reassuring.  You will need to follow-up with cardiology, they will increase her metoprolol and set you up with A. fib clinic.  Return to the ER if your symptoms worsen.

## 2019-08-03 ENCOUNTER — Telehealth: Payer: Self-pay

## 2019-08-03 ENCOUNTER — Emergency Department (HOSPITAL_COMMUNITY): Payer: Medicare Other

## 2019-08-03 ENCOUNTER — Inpatient Hospital Stay (HOSPITAL_COMMUNITY)
Admission: EM | Admit: 2019-08-03 | Discharge: 2019-08-05 | DRG: 310 | Disposition: A | Discharge status: Home / Self Care | Payer: Medicare Other | Attending: Internal Medicine | Admitting: Internal Medicine

## 2019-08-03 ENCOUNTER — Encounter (HOSPITAL_COMMUNITY): Payer: Self-pay

## 2019-08-03 ENCOUNTER — Ambulatory Visit (HOSPITAL_COMMUNITY): Payer: Federal, State, Local not specified - PPO | Admitting: Physician Assistant

## 2019-08-03 DIAGNOSIS — Z887 Allergy status to serum and vaccine status: Secondary | ICD-10-CM

## 2019-08-03 DIAGNOSIS — D649 Anemia, unspecified: Secondary | ICD-10-CM | POA: Diagnosis present

## 2019-08-03 DIAGNOSIS — I4891 Unspecified atrial fibrillation: Secondary | ICD-10-CM | POA: Diagnosis present

## 2019-08-03 DIAGNOSIS — Z823 Family history of stroke: Secondary | ICD-10-CM

## 2019-08-03 DIAGNOSIS — Z8546 Personal history of malignant neoplasm of prostate: Secondary | ICD-10-CM

## 2019-08-03 DIAGNOSIS — Z79899 Other long term (current) drug therapy: Secondary | ICD-10-CM

## 2019-08-03 DIAGNOSIS — I2510 Atherosclerotic heart disease of native coronary artery without angina pectoris: Secondary | ICD-10-CM | POA: Diagnosis present

## 2019-08-03 DIAGNOSIS — Z885 Allergy status to narcotic agent status: Secondary | ICD-10-CM

## 2019-08-03 DIAGNOSIS — G20 Parkinson's disease: Secondary | ICD-10-CM | POA: Diagnosis present

## 2019-08-03 DIAGNOSIS — E1122 Type 2 diabetes mellitus with diabetic chronic kidney disease: Secondary | ICD-10-CM | POA: Diagnosis present

## 2019-08-03 DIAGNOSIS — E1129 Type 2 diabetes mellitus with other diabetic kidney complication: Secondary | ICD-10-CM | POA: Diagnosis present

## 2019-08-03 DIAGNOSIS — R29810 Facial weakness: Secondary | ICD-10-CM

## 2019-08-03 DIAGNOSIS — I129 Hypertensive chronic kidney disease with stage 1 through stage 4 chronic kidney disease, or unspecified chronic kidney disease: Secondary | ICD-10-CM | POA: Diagnosis present

## 2019-08-03 DIAGNOSIS — Z66 Do not resuscitate: Secondary | ICD-10-CM | POA: Diagnosis present

## 2019-08-03 DIAGNOSIS — Z87442 Personal history of urinary calculi: Secondary | ICD-10-CM

## 2019-08-03 DIAGNOSIS — H3530 Unspecified macular degeneration: Secondary | ICD-10-CM | POA: Diagnosis present

## 2019-08-03 DIAGNOSIS — I480 Paroxysmal atrial fibrillation: Principal | ICD-10-CM | POA: Diagnosis present

## 2019-08-03 DIAGNOSIS — M1990 Unspecified osteoarthritis, unspecified site: Secondary | ICD-10-CM | POA: Diagnosis present

## 2019-08-03 DIAGNOSIS — N1832 Chronic kidney disease, stage 3b: Secondary | ICD-10-CM | POA: Diagnosis present

## 2019-08-03 DIAGNOSIS — Z87892 Personal history of anaphylaxis: Secondary | ICD-10-CM

## 2019-08-03 DIAGNOSIS — R4781 Slurred speech: Secondary | ICD-10-CM

## 2019-08-03 DIAGNOSIS — Z8673 Personal history of transient ischemic attack (TIA), and cerebral infarction without residual deficits: Secondary | ICD-10-CM

## 2019-08-03 DIAGNOSIS — Z7902 Long term (current) use of antithrombotics/antiplatelets: Secondary | ICD-10-CM

## 2019-08-03 DIAGNOSIS — Z515 Encounter for palliative care: Secondary | ICD-10-CM | POA: Diagnosis present

## 2019-08-03 DIAGNOSIS — E039 Hypothyroidism, unspecified: Secondary | ICD-10-CM | POA: Diagnosis present

## 2019-08-03 DIAGNOSIS — H9190 Unspecified hearing loss, unspecified ear: Secondary | ICD-10-CM | POA: Diagnosis present

## 2019-08-03 DIAGNOSIS — Z20822 Contact with and (suspected) exposure to covid-19: Secondary | ICD-10-CM | POA: Diagnosis present

## 2019-08-03 DIAGNOSIS — Z91041 Radiographic dye allergy status: Secondary | ICD-10-CM

## 2019-08-03 DIAGNOSIS — I252 Old myocardial infarction: Secondary | ICD-10-CM

## 2019-08-03 DIAGNOSIS — Z85828 Personal history of other malignant neoplasm of skin: Secondary | ICD-10-CM

## 2019-08-03 DIAGNOSIS — E785 Hyperlipidemia, unspecified: Secondary | ICD-10-CM | POA: Diagnosis present

## 2019-08-03 DIAGNOSIS — Z955 Presence of coronary angioplasty implant and graft: Secondary | ICD-10-CM

## 2019-08-03 DIAGNOSIS — Z7901 Long term (current) use of anticoagulants: Secondary | ICD-10-CM

## 2019-08-03 DIAGNOSIS — G20A1 Parkinson's disease without dyskinesia, without mention of fluctuations: Secondary | ICD-10-CM | POA: Diagnosis present

## 2019-08-03 DIAGNOSIS — I10 Essential (primary) hypertension: Secondary | ICD-10-CM | POA: Diagnosis present

## 2019-08-03 DIAGNOSIS — N183 Chronic kidney disease, stage 3 unspecified: Secondary | ICD-10-CM | POA: Diagnosis present

## 2019-08-03 DIAGNOSIS — Z881 Allergy status to other antibiotic agents status: Secondary | ICD-10-CM

## 2019-08-03 DIAGNOSIS — Z86718 Personal history of other venous thrombosis and embolism: Secondary | ICD-10-CM

## 2019-08-03 DIAGNOSIS — Z888 Allergy status to other drugs, medicaments and biological substances status: Secondary | ICD-10-CM

## 2019-08-03 DIAGNOSIS — Z8249 Family history of ischemic heart disease and other diseases of the circulatory system: Secondary | ICD-10-CM

## 2019-08-03 DIAGNOSIS — Z79890 Hormone replacement therapy (postmenopausal): Secondary | ICD-10-CM

## 2019-08-03 DIAGNOSIS — K219 Gastro-esophageal reflux disease without esophagitis: Secondary | ICD-10-CM | POA: Diagnosis present

## 2019-08-03 DIAGNOSIS — Z974 Presence of external hearing-aid: Secondary | ICD-10-CM

## 2019-08-03 DIAGNOSIS — R55 Syncope and collapse: Secondary | ICD-10-CM | POA: Diagnosis present

## 2019-08-03 HISTORY — DX: Other specified health status: Z78.9

## 2019-08-03 HISTORY — DX: Parkinson's disease: G20

## 2019-08-03 HISTORY — DX: Parkinson's disease without dyskinesia, without mention of fluctuations: G20.A1

## 2019-08-03 HISTORY — DX: Anemia, unspecified: D64.9

## 2019-08-03 HISTORY — DX: Atherosclerotic heart disease of native coronary artery without angina pectoris: I25.10

## 2019-08-03 LAB — RAPID URINE DRUG SCREEN, HOSP PERFORMED
Amphetamines: NOT DETECTED
Barbiturates: NOT DETECTED
Benzodiazepines: NOT DETECTED
Cocaine: NOT DETECTED
Opiates: NOT DETECTED
Tetrahydrocannabinol: NOT DETECTED

## 2019-08-03 LAB — URINALYSIS, ROUTINE W REFLEX MICROSCOPIC
Bilirubin Urine: NEGATIVE
Glucose, UA: NEGATIVE mg/dL
Hgb urine dipstick: NEGATIVE
Ketones, ur: 5 mg/dL — AB
Nitrite: NEGATIVE
Protein, ur: NEGATIVE mg/dL
Specific Gravity, Urine: 1.016 (ref 1.005–1.030)
pH: 7 (ref 5.0–8.0)

## 2019-08-03 LAB — DIFFERENTIAL
Abs Immature Granulocytes: 0.02 10*3/uL (ref 0.00–0.07)
Basophils Absolute: 0.1 10*3/uL (ref 0.0–0.1)
Basophils Relative: 1 %
Eosinophils Absolute: 0.3 10*3/uL (ref 0.0–0.5)
Eosinophils Relative: 5 %
Immature Granulocytes: 0 %
Lymphocytes Relative: 27 %
Lymphs Abs: 1.5 10*3/uL (ref 0.7–4.0)
Monocytes Absolute: 0.7 10*3/uL (ref 0.1–1.0)
Monocytes Relative: 14 %
Neutro Abs: 2.8 10*3/uL (ref 1.7–7.7)
Neutrophils Relative %: 53 %

## 2019-08-03 LAB — COMPREHENSIVE METABOLIC PANEL
ALT: 7 U/L (ref 0–44)
AST: 7 U/L — ABNORMAL LOW (ref 15–41)
Albumin: 3.3 g/dL — ABNORMAL LOW (ref 3.5–5.0)
Alkaline Phosphatase: 73 U/L (ref 38–126)
Anion gap: 10 (ref 5–15)
BUN: 36 mg/dL — ABNORMAL HIGH (ref 8–23)
CO2: 20 mmol/L — ABNORMAL LOW (ref 22–32)
Calcium: 8.6 mg/dL — ABNORMAL LOW (ref 8.9–10.3)
Chloride: 109 mmol/L (ref 98–111)
Creatinine, Ser: 1.65 mg/dL — ABNORMAL HIGH (ref 0.61–1.24)
GFR calc Af Amer: 43 mL/min — ABNORMAL LOW (ref >60)
GFR calc non Af Amer: 37 mL/min — ABNORMAL LOW (ref >60)
Glucose, Bld: 132 mg/dL — ABNORMAL HIGH (ref 70–99)
Potassium: 3.8 mmol/L (ref 3.5–5.1)
Sodium: 139 mmol/L (ref 135–145)
Total Bilirubin: 0.8 mg/dL (ref 0.3–1.2)
Total Protein: 5.8 g/dL — ABNORMAL LOW (ref 6.5–8.1)

## 2019-08-03 LAB — I-STAT CHEM 8, ED
BUN: 34 mg/dL — ABNORMAL HIGH (ref 8–23)
Calcium, Ion: 1.12 mmol/L — ABNORMAL LOW (ref 1.15–1.40)
Chloride: 107 mmol/L (ref 98–111)
Creatinine, Ser: 1.7 mg/dL — ABNORMAL HIGH (ref 0.61–1.24)
Glucose, Bld: 126 mg/dL — ABNORMAL HIGH (ref 70–99)
HCT: 36 % — ABNORMAL LOW (ref 39.0–52.0)
Hemoglobin: 12.2 g/dL — ABNORMAL LOW (ref 13.0–17.0)
Potassium: 3.7 mmol/L (ref 3.5–5.1)
Sodium: 142 mmol/L (ref 135–145)
TCO2: 21 mmol/L — ABNORMAL LOW (ref 22–32)

## 2019-08-03 LAB — CBC
HCT: 37.8 % — ABNORMAL LOW (ref 39.0–52.0)
Hemoglobin: 12.1 g/dL — ABNORMAL LOW (ref 13.0–17.0)
MCH: 31.1 pg (ref 26.0–34.0)
MCHC: 32 g/dL (ref 30.0–36.0)
MCV: 97.2 fL (ref 80.0–100.0)
Platelets: 176 10*3/uL (ref 150–400)
RBC: 3.89 MIL/uL — ABNORMAL LOW (ref 4.22–5.81)
RDW: 12.3 % (ref 11.5–15.5)
WBC: 5.4 10*3/uL (ref 4.0–10.5)
nRBC: 0 % (ref 0.0–0.2)

## 2019-08-03 LAB — URINE CULTURE: Culture: 30000 — AB

## 2019-08-03 LAB — CBG MONITORING, ED
Glucose-Capillary: 128 mg/dL — ABNORMAL HIGH (ref 70–99)
Glucose-Capillary: 113 mg/dL — ABNORMAL HIGH (ref 70–99)

## 2019-08-03 LAB — TROPONIN I (HIGH SENSITIVITY)
Troponin I (High Sensitivity): 20 ng/L — ABNORMAL HIGH (ref <18)
Troponin I (High Sensitivity): 19 ng/L — ABNORMAL HIGH (ref <18)
Troponin I (High Sensitivity): 19 ng/L — ABNORMAL HIGH (ref <18)
Troponin I (High Sensitivity): 21 ng/L — ABNORMAL HIGH (ref <18)

## 2019-08-03 LAB — SARS CORONAVIRUS 2 BY RT PCR (HOSPITAL ORDER, PERFORMED IN ~~LOC~~ HOSPITAL LAB): SARS Coronavirus 2: NEGATIVE

## 2019-08-03 LAB — PROTIME-INR
INR: 1.1 (ref 0.8–1.2)
Prothrombin Time: 13.5 seconds (ref 11.4–15.2)

## 2019-08-03 LAB — ETHANOL: Alcohol, Ethyl (B): <10 mg/dL (ref <10)

## 2019-08-03 LAB — APTT: aPTT: 27 seconds (ref 24–36)

## 2019-08-03 LAB — TSH: TSH: 1.064 u[IU]/mL (ref 0.350–4.500)

## 2019-08-03 LAB — GLUCOSE, CAPILLARY: Glucose-Capillary: 155 mg/dL — ABNORMAL HIGH (ref 70–99)

## 2019-08-03 MED ORDER — BRINZOLAMIDE 1 % OP SUSP
1.0000 [drp] | Freq: Three times a day (TID) | OPHTHALMIC | Status: DC
  Administered 2019-08-03 – 2019-08-05 (×6): 1 [drp] via OPHTHALMIC
  Filled 2019-08-03: qty 10

## 2019-08-03 MED ORDER — LORATADINE 10 MG PO TABS
5.0000 mg | ORAL_TABLET | Freq: Every day | ORAL | Status: DC
  Administered 2019-08-03 – 2019-08-04 (×2): 5 mg via ORAL
  Filled 2019-08-03 (×2): qty 1

## 2019-08-03 MED ORDER — MELATONIN 3 MG PO TABS
3.0000 mg | ORAL_TABLET | Freq: Every day | ORAL | Status: DC
  Administered 2019-08-03 – 2019-08-04 (×2): 3 mg via ORAL
  Filled 2019-08-03 (×3): qty 1

## 2019-08-03 MED ORDER — APIXABAN 2.5 MG PO TABS
2.5000 mg | ORAL_TABLET | Freq: Two times a day (BID) | ORAL | Status: DC
  Administered 2019-08-03 – 2019-08-05 (×5): 2.5 mg via ORAL
  Filled 2019-08-03 (×6): qty 1

## 2019-08-03 MED ORDER — URO-MP 118 MG PO CAPS: 1.0000 | ORAL_CAPSULE | Freq: Three times a day (TID) | ORAL | Status: DC

## 2019-08-03 MED ORDER — CARBIDOPA-LEVODOPA 25-100 MG PO TABS
1.0000 | ORAL_TABLET | Freq: Three times a day (TID) | ORAL | Status: DC
  Administered 2019-08-03 – 2019-08-05 (×7): 1 via ORAL
  Filled 2019-08-03 (×8): qty 1

## 2019-08-03 MED ORDER — TRAMADOL HCL 50 MG PO TABS
50.0000 mg | ORAL_TABLET | Freq: Four times a day (QID) | ORAL | Status: DC | PRN
  Administered 2019-08-04: 50 mg via ORAL
  Filled 2019-08-03: qty 1

## 2019-08-03 MED ORDER — LATANOPROST 0.005 % OP SOLN
1.0000 [drp] | Freq: Every day | OPHTHALMIC | Status: DC
  Administered 2019-08-03 – 2019-08-04 (×2): 1 [drp] via OPHTHALMIC
  Filled 2019-08-03: qty 2.5

## 2019-08-03 MED ORDER — PANTOPRAZOLE SODIUM 40 MG PO TBEC
40.0000 mg | DELAYED_RELEASE_TABLET | Freq: Every day | ORAL | Status: DC
  Administered 2019-08-03 – 2019-08-05 (×3): 40 mg via ORAL
  Filled 2019-08-03 (×3): qty 1

## 2019-08-03 MED ORDER — BRIMONIDINE TARTRATE-TIMOLOL 0.2-0.5 % OP SOLN
1.0000 [drp] | Freq: Two times a day (BID) | OPHTHALMIC | Status: DC
  Filled 2019-08-03: qty 5

## 2019-08-03 MED ORDER — COLESEVELAM HCL 625 MG PO TABS
1875.0000 mg | ORAL_TABLET | Freq: Every evening | ORAL | Status: DC
  Administered 2019-08-03 – 2019-08-04 (×2): 1875 mg via ORAL
  Filled 2019-08-03 (×3): qty 3

## 2019-08-03 MED ORDER — DILTIAZEM HCL-DEXTROSE 125-5 MG/125ML-% IV SOLN (PREMIX)
5.0000 mg/h | INTRAVENOUS | Status: DC
  Administered 2019-08-03: 5 mg/h via INTRAVENOUS
  Filled 2019-08-03: qty 125

## 2019-08-03 MED ORDER — MIRABEGRON ER 25 MG PO TB24
50.0000 mg | ORAL_TABLET | Freq: Every day | ORAL | Status: DC
  Administered 2019-08-03 – 2019-08-05 (×3): 50 mg via ORAL
  Filled 2019-08-03 (×2): qty 2
  Filled 2019-08-03: qty 1

## 2019-08-03 MED ORDER — POLYETHYLENE GLYCOL 3350 17 G PO PACK
17.0000 g | PACK | Freq: Every day | ORAL | Status: DC
  Administered 2019-08-04 – 2019-08-05 (×2): 17 g via ORAL
  Filled 2019-08-03 (×2): qty 1

## 2019-08-03 MED ORDER — SERTRALINE HCL 50 MG PO TABS
25.0000 mg | ORAL_TABLET | Freq: Every day | ORAL | Status: DC
  Administered 2019-08-03 – 2019-08-05 (×3): 25 mg via ORAL
  Filled 2019-08-03 (×3): qty 1

## 2019-08-03 MED ORDER — ONDANSETRON HCL 4 MG/2ML IJ SOLN: 4.0000 mg | Freq: Four times a day (QID) | INTRAMUSCULAR | Status: DC | PRN

## 2019-08-03 MED ORDER — BRIMONIDINE TARTRATE 0.2 % OP SOLN
1.0000 [drp] | Freq: Two times a day (BID) | OPHTHALMIC | Status: DC
  Administered 2019-08-03 – 2019-08-05 (×4): 1 [drp] via OPHTHALMIC
  Filled 2019-08-03: qty 5

## 2019-08-03 MED ORDER — TIMOLOL MALEATE 0.5 % OP SOLN
1.0000 [drp] | Freq: Two times a day (BID) | OPHTHALMIC | Status: DC
  Administered 2019-08-03 – 2019-08-05 (×4): 1 [drp] via OPHTHALMIC
  Filled 2019-08-03: qty 5

## 2019-08-03 MED ORDER — METOPROLOL SUCCINATE ER 25 MG PO TB24
25.0000 mg | ORAL_TABLET | Freq: Every day | ORAL | Status: DC
  Administered 2019-08-03 – 2019-08-05 (×3): 25 mg via ORAL
  Filled 2019-08-03 (×3): qty 1

## 2019-08-03 MED ORDER — ACETAMINOPHEN 325 MG PO TABS: 650.0000 mg | ORAL_TABLET | ORAL | Status: DC | PRN

## 2019-08-03 MED ORDER — CLOPIDOGREL BISULFATE 75 MG PO TABS
75.0000 mg | ORAL_TABLET | Freq: Every day | ORAL | Status: DC
  Administered 2019-08-03 – 2019-08-05 (×3): 75 mg via ORAL
  Filled 2019-08-03 (×3): qty 1

## 2019-08-03 MED ORDER — DILTIAZEM HCL-DEXTROSE 125-5 MG/125ML-% IV SOLN (PREMIX)
5.0000 mg/h | INTRAVENOUS | Status: DC
  Administered 2019-08-03 (×2): 10 mg/h via INTRAVENOUS
  Filled 2019-08-03 (×2): qty 125

## 2019-08-03 MED ORDER — FLUTICASONE PROPIONATE 50 MCG/ACT NA SUSP
2.0000 | Freq: Every day | NASAL | Status: DC
  Filled 2019-08-03: qty 16

## 2019-08-03 MED ORDER — LEVOTHYROXINE SODIUM 75 MCG PO TABS
150.0000 ug | ORAL_TABLET | Freq: Every day | ORAL | Status: DC
  Administered 2019-08-03 – 2019-08-05 (×3): 150 ug via ORAL
  Filled 2019-08-03 (×3): qty 2

## 2019-08-03 MED ORDER — POLYVINYL ALCOHOL 1.4 % OP SOLN
1.0000 [drp] | Freq: Four times a day (QID) | OPHTHALMIC | Status: DC
  Administered 2019-08-03 – 2019-08-05 (×7): 1 [drp] via OPHTHALMIC
  Filled 2019-08-03: qty 15

## 2019-08-03 MED ORDER — INSULIN ASPART 100 UNIT/ML ~~LOC~~ SOLN
0.0000 [IU] | Freq: Three times a day (TID) | SUBCUTANEOUS | Status: DC
  Administered 2019-08-03: 1 [IU] via SUBCUTANEOUS
  Administered 2019-08-04: 2 [IU] via SUBCUTANEOUS
  Administered 2019-08-04: 1 [IU] via SUBCUTANEOUS

## 2019-08-03 MED ORDER — BENZONATATE 100 MG PO CAPS: 100.0000 mg | ORAL_CAPSULE | Freq: Three times a day (TID) | ORAL | Status: DC | PRN

## 2019-08-03 MED ORDER — DILTIAZEM LOAD VIA INFUSION
10.0000 mg | Freq: Once | INTRAVENOUS | Status: AC
  Administered 2019-08-03: 10 mg via INTRAVENOUS
  Filled 2019-08-03: qty 10

## 2019-08-03 MED ORDER — ALFUZOSIN HCL ER 10 MG PO TB24
10.0000 mg | ORAL_TABLET | Freq: Every evening | ORAL | Status: DC
  Administered 2019-08-03 – 2019-08-04 (×2): 10 mg via ORAL
  Filled 2019-08-03 (×4): qty 1

## 2019-08-03 NOTE — ED Notes (Signed)
Pt CBG 112. Not crossing over from glucometer. Notified Sarah, Therapist, sports.

## 2019-08-03 NOTE — ED Triage Notes (Signed)
Pt comes via [facility] from [facility], last seen normal at 440, pt got up to the bathroom and called out with CP and SOB, feeling like he was going to have a syncopal episode, pt was in afib RVR, pt had episode of slurred speech and L  Sided facial droop. PTA given 20mg  of Cardizem

## 2019-08-03 NOTE — Consult Note (Addendum)
Cardiology Consultation:   Patient ID: [name] MRN: [id number]; DOB: [birth year]  Admit date: 08/03/2019 Date of Consult: 08/03/2019  Primary Care Provider: Patient, No Pcp Per [facility] Cardiologist: Fransico Him, MD  [facility] Electrophysiologist:  None    Patient Profile:   [name] is a 86 y.o. male with a hx of CAD with STEMI 07/2017 s/p DES to LCx (25% mRCA and 70% oOM1 residual), paroxysmal atrial fibrillation, type 2 DM, HTN, statin intolerance, CKD stage III (baseline appears 1.3-1.7) CVA, prostate CA, Parkinson's disease, normocytic anemia, hypothyroidism, depression/anxiety, discolored urine related to uro-MP rx who is being seen today for the evaluation of atrial fibrillation at the request of Dr. Lorin Mercy.  History of Present Illness:   History is outlined above with STEMI with cath showed 25% mRCA, 70% oOM1, 80% mLCx and underwent PCI with DES to the LCx. He was placed on ASA/Brilinta. He was readmitted 08/2017 with fever and left hip pain and CT showed right hydronephrosis with nephrolithiasis and underwent cystoscopy/stent placement. He is also followed as outpatient by [facility] palliative care and is a DNR. His hospitalization was complicated by afib with RVR but spontaneously converted to NSR so anticoagulation was deferred. His atrial fib recurred in 02/2018 so aspirin was stopped, Brilinta changed to Plavix, and he was started on Eliquis.  He was last seen by cardiology via telemedicine in 04/2018 at which time he was continued on Plavix and Eliquis. Since that time it appears he's had numerous issues with recurrent atrial fib. He was admitted in 11/2018 with AF RVR and chronic blood loss anemia with negative FOBT and received 1 U PRBC. His Plavix and Eliquis were held per urology's request due to RBCs in the urine. It appears he's since been back on these. He was recently admitted 6/30-07/13/19 with chest pain and atrial fib with RVR. He was treated with IV  diltiazem with conversion back to NSR. 2D Echo 07/12/19 showed EF 55-60%, mild LVH, grade 2 DD, mildly elevated PASP, moderate LAE, dilated IVC. He has had several ED visits recently, including 7/14 when he sustained a fall and had staples in his head. He was seen again in the ED 08/01/19 with near-syncope and recurrent AF RVR. While in the ED he again self-converted to NSR. They spoke with Dr. Oval Linsey and close f/u with the atrial fib clinic was arranged. It appears his Toprol was increased from 12.5mg  to 25mg  daily. His appointment was supposed to occur today, however, he is back in the ED.  He got up to go shave this morning when he developed near-syncope and tachypalpitations. EMS was called who also noted a left facial droop and slurred speech. He was in AF RVR on arrival. Neurology consulted urgently for code stroke. CT head was nonacute along with brain MRI. They did note a left facial droop had also previously been noted on anesthesia evaluations in the past. They felt if MRI brain was nonacute, no further stroke workup needed. He was placed on IV diltiazem bolus then drip with improvement in his HR to the 90s. He reports he is feeling much better without any CP, SOB or palpitations. His only complaint is that he doesn't have his hearing aids in, which makes history obtaining challenging. He denies any missed doses of his blood thinners. Labs show Cr 1.65, Hgb 12.1, hsTroponin 20-20-19-19-21, TSH wnl, Covid negative.   Past Medical History:  Diagnosis Date  . Allergic rhinitis   . Arthritis    arthritis-kyphosis  .  Asthmatic bronchitis   . CAD in native artery    a. STEMI 07/2017 s/p DES to LCx (25% mRCA and 70% oOM1 residual).  . CKD (chronic kidney disease) stage 3, GFR 30-59 ml/min   . DM type 2 (diabetes mellitus, type 2) ([place])   . DVT, lower extremity, proximal, acute ([place])   . Dyslipidemia   . GERD (gastroesophageal reflux disease)   . Hearing impaired    bilateral hearing aids  .  History of kidney stones   . History of skin cancer   . Hypertension   . Hypothyroidism   . Macular degeneration    injections done bilaterally recent - 08-22-13  . Normocytic anemia   . PAF (paroxysmal atrial fibrillation) ([place])   . Parkinson's disease ([place])   . Prostate cancer [facility])    Prostate cancer-radiation only,skin cancer-basal cell and last squamous cell right eye  . Statin intolerance   . Stroke ([place])   . Urgency-frequency syndrome   . Urticaria     Past Surgical History:  Procedure Laterality Date  . BACK SURGERY     lumbar fracture  . cataract surgery Bilateral   . COLONOSCOPY W/ POLYPECTOMY     x2   . CORONARY STENT INTERVENTION N/A 08/06/2017   Procedure: CORONARY STENT INTERVENTION;  Surgeon: Jettie Booze, MD;  Location: [facility];  Service: Cardiovascular;  Laterality: N/A;  . CYSTOSCOPY W/ URETERAL STENT PLACEMENT Right 08/13/2017   Procedure: CYSTOSCOPY WITH RIGHT  RETROGRADE PYELOGRAM/URETERAL STENT PLACEMENT;  Surgeon: Raynelle Bring, MD;  Location: [facility];  Service: Urology;  Laterality: Right;  . CYSTOSCOPY WITH INJECTION N/A 09/11/2013   Procedure: CYSTOSCOPY WITH BOTOX INJECTION INTO THE BLADDER;  Surgeon: Ailene Rud, MD;  Location: [facility];  Service: Urology;  Laterality: N/A;  . CYSTOSCOPY WITH RETROGRADE PYELOGRAM, URETEROSCOPY AND STENT PLACEMENT    . CYSTOSCOPY WITH RETROGRADE PYELOGRAM, URETEROSCOPY AND STENT PLACEMENT Right 12/22/2018   Procedure: CYSTOSCOPY WITH RETROGRADE PYELOGRAM, URETEROSCOPY AND STENT PLACEMENT;  Surgeon: Cleon Gustin, MD;  [facility];  Service: Urology;  Laterality: Right;  90 MINS  . CYSTOSCOPY WITH STENT PLACEMENT Right 01/26/2016   Procedure: CYSTOSCOPY, RIGHT RETROGRADE WITH RIGHT URETERAL STENT PLACEMENT;  Surgeon: Cleon Gustin, MD;  [facility];  Service: Urology;  Laterality: Right;  . CYSTOSCOPY/RETROGRADE/URETEROSCOPY Right 04/22/2016   Procedure: CYSTOSCOPY/RETROGRADE/  REMOVAL JJ STENT right insertion stent right insertion of foley;  Surgeon: Franchot Gallo, MD;  [facility];  Service: Urology;  Laterality: Right;  . EXTRACORPOREAL SHOCK WAVE LITHOTRIPSY Right 02/27/2016   Procedure: RIGHT EXTRACORPOREAL SHOCK WAVE LITHOTRIPSY (ESWL) GAITED;  Surgeon: Cleon Gustin, MD;  [facility];  Service: Urology;  Laterality: Right;  . EXTRACORPOREAL SHOCK WAVE LITHOTRIPSY Right 02/10/2016   Procedure: EXTRACORPOREAL SHOCK WAVE LITHOTRIPSY (ESWL);  Surgeon: Kathie Rhodes, MD;  [facility];  Service: Urology;  Laterality: Right;  [facility]-[id number] [id number] A [employer]- [id number]   . EYE SURGERY     macular degeneration  . GALLBLADDER SURGERY  2006   no cholecystectomy  . HERNIA REPAIR    . HOLMIUM LASER APPLICATION Right 17/79/3903   Procedure: HOLMIUM LASER APPLICATION;  Surgeon: Cleon Gustin, MD;  [facility];  Service: Urology;  Laterality: Right;  . LEFT HEART CATH AND CORONARY ANGIOGRAPHY N/A 08/06/2017   Procedure: LEFT HEART CATH AND CORONARY ANGIOGRAPHY;  Surgeon: Jettie Booze, MD;  Location: [facility];  Service: Cardiovascular;  Laterality: N/A;  . LUNG REMOVAL,  PARTIAL  age 89   for bronchiectasis  . PARTIAL THYMECTOMY  1985   for nodules  . POPLITEAL SYNOVIAL CYST EXCISION  2005  . TOTAL KNEE ARTHROPLASTY  2008   right  . TOTAL KNEE ARTHROPLASTY  2010   left     Home Medications:  Prior to Admission medications   Medication Sig Start Date End Date Taking? Authorizing Provider  alfuzosin (UROXATRAL) 10 MG 24 hr tablet Take 1 tablet (10 mg total) by mouth at bedtime. Patient taking differently: Take 10 mg by mouth every evening.  02/10/16  Yes Kathie Rhodes, MD  Alogliptin Benzoate 12.5 MG TABS Take 12.5 mg by mouth daily.    Yes [provider]  apixaban (ELIQUIS) 2.5 MG TABS tablet Take 2.5 mg by mouth 2 (two) times daily.   Yes [provider]  benzonatate  (TESSALON) 100 MG capsule Take 100 mg by mouth every 8 (eight) hours as needed for cough.    Yes [provider]  brimonidine-timolol (COMBIGAN) 0.2-0.5 % ophthalmic solution Place 1 drop into both eyes 2 (two) times daily.   Yes [provider]  brinzolamide (AZOPT) 1 % ophthalmic suspension Place 1 drop into the right eye 3 (three) times daily.  09/03/16  Yes [provider]  carbidopa-levodopa (SINEMET IR) 25-100 MG tablet Take 1 tablet by mouth 3 (three) times daily.    Yes [provider]  Carboxymethylcellulose Sodium (ARTIFICIAL TEARS OP) Place 1 drop into both eyes 4 (four) times daily.   Yes [provider]  Cholecalciferol (VITAMIN D-3) 5000 units TABS Take 5,000 Units by mouth daily.   Yes [provider]  clopidogrel (PLAVIX) 75 MG tablet Take 75 mg by mouth daily.    Yes [provider]  colesevelam (WELCHOL) 625 MG tablet Take 1,875 mg by mouth every evening.    Yes [provider]  Cyanocobalamin (VITAMIN B-12) 2500 MCG SUBL Place 1 tablet under the tongue daily.   Yes [provider]  diltiazem (CARDIZEM CD) 120 MG 24 hr capsule Take 1 capsule (120 mg total) by mouth daily for 30 days. 03/12/18  Yes Elodia Florence., MD  docusate sodium (COLACE) 100 MG capsule Take 100 mg by mouth 2 (two) times daily.    Yes [provider]  ferrous sulfate 325 (65 FE) MG EC tablet Take 1 tablet (325 mg total) by mouth 2 (two) times daily with a meal. 11/14/18 11/14/19 Yes Danford, Suann Larry, MD  fluticasone (FLONASE) 50 MCG/ACT nasal spray Place 2 sprays into both nostrils daily.   Yes [provider]  furosemide (LASIX) 20 MG tablet Take 20 mg by mouth daily.   Yes [provider]  latanoprost (XALATAN) 0.005 % ophthalmic solution Place 1 drop into the right eye at bedtime.  07/28/16  Yes [provider]  levocetirizine (XYZAL) 5 MG tablet Take 2.5 mg by mouth at bedtime.  04/15/18   Yes [provider]  levothyroxine (SYNTHROID) 150 MCG tablet Take 150 mcg by mouth daily.    Yes [provider]  Melatonin 3 MG SUBL Place 3 mg under the tongue at bedtime. 11/11/18  Yes [provider]  Meth-Hyo-M Bl-Na Phos-Ph Sal (URO-MP) 118 MG CAPS Take 1 capsule by mouth 3 (three) times daily.   Yes [provider]  metoprolol succinate (TOPROL-XL) 25 MG 24 hr tablet Take 0.5 tablets (12.5 mg total) by mouth daily for 30 days. Patient taking differently: Take 25 mg by mouth daily. Hold  if HR is < 50 03/12/18  Yes Elodia Florence., MD  mirabegron ER (MYRBETRIQ) 50 MG TB24 tablet Take 1 tablet (50 mg total) by mouth daily. 01/28/16  Yes McKenzie, Candee Furbish, MD  Multiple Vitamins-Minerals (PRESERVISION/LUTEIN) CAPS Take 1 capsule by mouth 2 (two) times daily.   Yes [provider]  pantoprazole (PROTONIX) 40 MG tablet Take 40 mg by mouth daily. 06/29/19  Yes [provider]  polyethylene glycol (MIRALAX / GLYCOLAX) packet Take 17 g by mouth daily. [place]   Yes [provider]  potassium chloride (K-DUR,KLOR-CON) 10 MEQ tablet Take 10 mEq by mouth daily. Dissolve in applesauce   Yes [provider]  sertraline (ZOLOFT) 25 MG tablet Take 25 mg by mouth daily.   Yes [provider]  traMADol (ULTRAM) 50 MG tablet Take 1 tablet (50 mg total) by mouth every 6 (six) hours as needed. Patient taking differently: Take 50 mg by mouth every 6 (six) hours as needed for moderate pain.  12/22/18 12/22/19 Yes McKenzie, Candee Furbish, MD    Inpatient Medications: Scheduled Meds: . alfuzosin  10 mg Oral QPM  . apixaban  2.5 mg Oral BID  . brimonidine  1 drop Both Eyes BID   And  . timolol  1 drop Both Eyes BID  . brinzolamide  1 drop Right Eye TID  . carbidopa-levodopa  1 tablet Oral TID  . clopidogrel  75 mg Oral Daily  . colesevelam  1,875 mg Oral QPM  . fluticasone  2 spray Each Nare Daily  . insulin aspart  0-9  Units Subcutaneous TID WC  . latanoprost  1 drop Right Eye QHS  . levothyroxine  150 mcg Oral Daily  . loratadine  5 mg Oral QHS  . melatonin  3 mg Oral QHS  . metoprolol succinate  25 mg Oral Daily  . mirabegron ER  50 mg Oral Daily  . pantoprazole  40 mg Oral Daily  . polyethylene glycol  17 g Oral Daily  . polyvinyl alcohol  1 drop Both Eyes QID  . sertraline  25 mg Oral Daily   Continuous Infusions: . diltiazem (CARDIZEM) infusion 10 mg/hr (08/03/19 0958)   PRN Meds: acetaminophen, benzonatate, ondansetron (ZOFRAN) IV, traMADol  Allergies:    Allergies  Allergen Reactions  . Colchicine Anaphylaxis  . Hibiclens [Chlorhexidine] Hives  . Atorvastatin Other (See Comments)    Joint pain   . Ceftin Diarrhea  . Celebrex [Celecoxib] Swelling  . Codeine Hives  . Erythromycin Diarrhea  . Ezetimibe Other (See Comments)    Joint pain   . Iodinated Diagnostic Agents Nausea And Vomiting and Rash  . Oxycodone-Acetaminophen Hives  . Rosuvastatin Other (See Comments)    Joint pain   . Simvastatin Other (See Comments)    Joint pain   . Cefuroxime   . Celebrex [Celecoxib]   . Codeine   . Crestor [Rosuvastatin]   . Erythromycin   . Lipitor [Atorvastatin]   . Nitroglycerin   . Percocet [Oxycodone-Acetaminophen]   . Protonix [Pantoprazole]   . Tetanus Toxoids   . Cefpodoxime Rash  . Cefuroxime Axetil Rash  . Nitroglycerin Other (See Comments)    Lowers patient's B/P  . Vantin Other (See Comments)    Unknown/not noted on MAR??    Social History:   Social History   Socioeconomic History  . Marital status: Married    Spouse name: Not on file  . Number of children: 2  . Years of education:  Not on file  . Highest education level: Not on file  Occupational History  . Occupation: retired  Tobacco Use  . Smoking status: Never Smoker  . Smokeless tobacco: Never Used  Substance and Sexual Activity  . Alcohol use: No  . Drug use: No  . Sexual activity: Not Currently  Other  Topics Concern  . Not on file  Social History Narrative   ** Merged History Encounter **       MUST HAVE MEDS LISTED DO NOT SUBSTITUTE GENERICS PATIENT IS ALLERGIC TO DYES   Social Determinants of Health   Financial Resource Strain:   . Difficulty of Paying Living Expenses:   Food Insecurity:   . Worried About Charity fundraiser in the Last Year:   . Arboriculturist in the Last Year:   Transportation Needs:   . Film/video editor (Medical):   Marland Kitchen Lack of Transportation (Non-Medical):   Physical Activity:   . Days of Exercise per Week:   . Minutes of Exercise per Session:   Stress:   . Feeling of Stress :   Social Connections:   . Frequency of Communication with Friends and Family:   . Frequency of Social Gatherings with Friends and Family:   . Attends Religious Services:   . Active Member of Clubs or Organizations:   . Attends Archivist Meetings:   Marland Kitchen Marital Status:   Intimate Partner Violence:   . Fear of Current or Ex-Partner:   . Emotionally Abused:   Marland Kitchen Physically Abused:   . Sexually Abused:     Family History:    Family History  Problem Relation Age of Onset  . Other Sister        ophth disease  . Heart attack Father        x7  . Heart disease Father        MI x7  . Hypertension Mother   . Stroke Mother 46  . Amblyopia Neg Hx   . Blindness Neg Hx   . Glaucoma Neg Hx   . Macular degeneration Neg Hx   . Retinal detachment Neg Hx   . Strabismus Neg Hx   . Retinitis pigmentosa Neg Hx   . Cataracts Neg Hx      ROS:  Please see the history of present illness.  All other ROS reviewed and negative.     Physical Exam/Data:   Vitals:   08/03/19 1440 08/03/19 1500 08/03/19 1540 08/03/19 1659  BP: (!) 117/88 119/80 (!) 138/87 108/82  Pulse: 65 49 80   Resp: 20 22 23 22   Temp:      TempSrc:      SpO2: 97% 96% 97% 92%  Weight:        Intake/Output Summary (Last 24 hours) at 08/03/2019 1711 Last data filed at 08/03/2019 1408 Gross per 24  hour  Intake 18.53 ml  Output 225 ml  Net -206.47 ml   Last 3 Weights 08/03/2019 08/01/2019 07/26/2019  Weight (lbs) 156 lb 8.4 oz 145 lb 157 lb  Weight (kg) 71 kg 65.772 kg 71.215 kg     Body mass index is 22.46 kg/m.  Vital Signs. BP 108/82   Pulse 80   Temp (!) 97.3 F (36.3 C) (Oral)   Resp 22   Wt 71 kg   SpO2 92%   BMI 22.46 kg/m  General: Well developed thin elderly WM in no acute distress Head: Normocephalic, atraumatic, sclera non-icteric, no xanthomas, nares are without  discharge. Neck: Negative for carotid bruits. JVP not elevated. Lungs: Clear bilaterally to auscultation without wheezes, rales, or rhonchi. Breathing is unlabored. Heart: Irregularly irregular, mildly tachycardic, R S1 S2 without murmurs, rubs, or gallops.  Abdomen: Soft, non-tender, non-distended with normoactive bowel sounds. No rebound/guarding. Extremities: No clubbing or cyanosis. No edema. Distal pedal pulses are 2+ and equal bilaterally.  Neuro: Alert and oriented X 3. Moves all extremities spontaneously. Subtle left facial droop noted. Psych:  Responds to questions appropriately with a normal affect.   EKG:  The EKG was personally reviewed and demonstrates:Atrial fibrillation 153bpm, LVH, nonspecific STT changes  Telemetry:  Telemetry was personally reviewed and demonstrates:  Atrial fib rates currently round 90-100  Relevant CV Studies: 2D echo 07/12/19   1. Mild concentric LVH with moderate hypertrophy of the basal septum.  Left ventricular ejection fraction, by estimation, is 55 to 60%. The left  ventricle has normal function. The left ventricle has no regional wall  motion abnormalities. There is mild  concentric left ventricular hypertrophy. Left ventricular diastolic  parameters are consistent with Grade II diastolic dysfunction  (pseudonormalization).  2. Right ventricular systolic function is normal. The right ventricular  size is normal. There is mildly elevated pulmonary artery  systolic  pressure.  3. Left atrial size was moderately dilated.  4. The mitral valve is normal in structure. Trivial mitral valve  regurgitation. No evidence of mitral stenosis.  5. The aortic valve is tricuspid. Aortic valve regurgitation is mild. No  aortic stenosis is present.  6. The inferior vena cava is dilated in size with <50% respiratory  variability, suggesting right atrial pressure of 15 mmHg.   Laboratory Data:  High Sensitivity Troponin:   Recent Labs  Lab 08/01/19 1031 08/03/19 0543 08/03/19 0924 08/03/19 1002 08/03/19 1228  TROPONINIHS 20* 20* 19* 19* 21*     Chemistry Recent Labs  Lab 08/01/19 0839 08/03/19 0543 08/03/19 0546  NA 140 139 142  K 4.3 3.8 3.7  CL 109 109 107  CO2 23 20*  --   GLUCOSE 118* 132* 126*  BUN 30* 36* 34*  CREATININE 1.68* 1.65* 1.70*  CALCIUM 8.4* 8.6*  --   GFRNONAA 36* 37*  --   GFRAA 42* 43*  --   ANIONGAP 8 10  --     Recent Labs  Lab 08/01/19 0839 08/03/19 0543  PROT 5.8* 5.8*  ALBUMIN 3.3* 3.3*  AST 8* 7*  ALT <5 7  ALKPHOS 70 73  BILITOT 0.8 0.8   Hematology Recent Labs  Lab 08/01/19 0839 08/03/19 0543 08/03/19 0546  WBC 6.5 5.4  --   RBC 3.82* 3.89*  --   HGB 11.9* 12.1* 12.2*  HCT 37.6* 37.8* 36.0*  MCV 98.4 97.2  --   MCH 31.2 31.1  --   MCHC 31.6 32.0  --   RDW 12.2 12.3  --   PLT 186 176  --    BNPNo results for input(s): BNP, PROBNP in the last 168 hours.  DDimer No results for input(s): DDIMER in the last 168 hours.   Radiology/Studies:  MR BRAIN WO CONTRAST  Result Date: 08/03/2019 CLINICAL DATA:  Neuro deficit. EXAM: MRI HEAD WITHOUT CONTRAST TECHNIQUE: Multiplanar, multiecho pulse sequences of the brain and surrounding structures were obtained without intravenous contrast. COMPARISON:  08/03/2019 head CT.  10/14/2014 MRI head. FINDINGS: Brain: Moderate diffuse parenchymal volume loss with ex vacuo dilatation. Remote right corona radiata, right thalamic and right cerebellar  infarcts. Scattered and confluent supratentorial white matter  T2/FLAIR hyperintense foci. No acute infarct. Tiny remote right frontal and left cerebellar microhemorrhages. No midline shift or extra-axial fluid collection. No mass lesion. Vascular: Normal flow voids. Skull and upper cervical spine: Normal marrow signal. Sinuses/Orbits: Normal orbits. Sequela of bilateral lens replacement. Mild ethmoid sinus mucosal thickening with layering left sphenoid secretions. Trace right mastoid effusion. Other: None. IMPRESSION: No acute intracranial process. Small remote infarcts involving the right corona radiata, right thalamus and right cerebellum. Moderate cerebral atrophy. Mild chronic microvascular ischemic changes. Electronically Signed   By: Primitivo Gauze M.D.   On: 08/03/2019 08:17   DG Chest Portable 1 View  Result Date: 08/01/2019 CLINICAL DATA:  AFib EXAM: PORTABLE CHEST 1 VIEW COMPARISON:  July 12, 2019, November 13, 2018, August 13, 2017 FINDINGS: The cardiomediastinal silhouette is unchanged in contour. Moderate hiatal hernia. Atherosclerotic calcifications. No pleural effusion. No pneumothorax. Unchanged bibasilar interstitial prominence with unchanged bibasilar heterogeneous opacities, likely chronic scarring/sequela of chronic aspiration. Visualized abdomen is unremarkable. Degenerative changes of the thoracic spine. IMPRESSION: 1. Moderate hiatal hernia. 2. Unchanged bibasilar scarring/sequela of chronic aspiration. Electronically Signed   By: Valentino Saxon MD   On: 08/01/2019 09:01   CT HEAD CODE STROKE WO CONTRAST  Result Date: 08/03/2019 CLINICAL DATA:  Code stroke.  Left facial droop and slurred speech. EXAM: CT HEAD WITHOUT CONTRAST TECHNIQUE: Contiguous axial images were obtained from the base of the skull through the vertex without intravenous contrast. COMPARISON:  12/30/2018 FINDINGS: Brain: There is no evidence of acute infarct, intracranial hemorrhage, mass, midline shift, or  extra-axial fluid collection. Hypodensities in the cerebral white matter bilaterally are unchanged and nonspecific but compatible with mild chronic small vessel ischemic disease. Small chronic infarcts are noted in the right thalamus and right cerebellum. There are dilated perivascular spaces in the basal ganglia. There is mild cerebral atrophy. Vascular: Calcified atherosclerosis at the skull base. No hyperdense vessel. Skull: No fracture or suspicious osseous lesion. Sinuses/Orbits: Small volume fluid in the left sphenoid sinus. Clear mastoid air cells. Bilateral cataract extraction. Other: None. ASPECTS [facility] Stroke Program Early CT Score) - Ganglionic level infarction (caudate, lentiform nuclei, internal capsule, insula, M1-M3 cortex): 7 - Supraganglionic infarction (M4-M6 cortex): 3 Total score (0-10 with 10 being normal): 10 IMPRESSION: 1. No evidence of acute intracranial abnormality. 2. ASPECTS is 10. 3. Mild chronic small vessel ischemic disease. These results were communicated to Dr. Leonel Ramsay at 6:03 am on 08/03/2019 by text page via the [facility] messaging system. Electronically Signed   By: Logan Bores M.D.   On: 08/03/2019 06:03   { Assessment and Plan:   1. Recurrent atrial fibrillation - the patient has had multiple issues with recurrent atrial fib, including a flurry this year. He was on diltiazem CD 120mg  daily and metoprolol 25mg  daily per [facility]. At this point, anticipate that antiarrhythmic would be beneficial. With h/o CAD, not a candidate for flecainide. Less ideal for Tikosyn given CKD. - conferred with MD, ideally would hope to start amiodarone however patient has a history of rash, nausea and vomiting with iodinated agents - we may need to avoid this given potential cross reactivity - plan to confer with pharmacy in AM about potentially starting amiodarone  - we will continue IV diltiazem at present rate along with oral metoprolol - he typically self converts so titrating either agent  would be another option as BP allows - will need close f/u with atrial fib clinic to try and keep him from having to come back to the hospital -  CHADSVASC 7 therefore continue Eliquis  2. Left facial droop with known history of CVA - CT head and brain MRI nonacute - per neuro note, no further workup may be needed (left facial droop also noted in prior anesthesia notes) - continue Eliquis  3. CKD stage III - Cr appears at baseline  4. Mildly elevated troponin with known history of CAD  - low/flat trend, arguing against ACS - no further workup anticipated at this time - continue Plavix for now but consideration of long term duration will be at discretion of primary cardiologist  5. Hypothyroidism - TSH wnl  For questions or updates, please contact [place] Please consult [email or web address] for contact info under    Signed, Charlie Pitter, PA-C  08/03/2019 5:11 PM  Patient seen and examined with Melina Copa PA-C.  Agree as above, with the following exceptions and changes as noted below. He is resting comfortably without chest pain or SOB. 86 yo male with recurrent atrial fibrillation, CAD, prior stroke, Parkinson's disease. Gen: NAD, CV: iRRR, no murmurs, Lungs: clear, Abd: soft, Extrem: Warm, well perfused, no edema, Neuro/Psych: alert and oriented x 3, normal mood and affect. All available labs, radiology testing, previous records reviewed. He has been converting sponatenously previously, and may do so again this admission. However, he may benefit from addition of amiodarone. We will review extensive allergy list with cardiac pharmacist tomorrow for any contraindications and if none, will plan to load amiodarone 200 mg BID. For 5 gram load and Afib clinic follow up soon.   Elouise Munroe, MD

## 2019-08-03 NOTE — ED Provider Notes (Signed)
[name]MENT Provider Note  CSN: [id number] Arrival date & time: 08/03/19 0534  Chief Complaint(s) Code Stroke  HPI [name] is a 86 y.o. male with a history of CKD stage III, DM type II, DVTs, GERD, hypothyroidism, hypertension, paroxysmal A. fib, prostate cancer presents to the ER via EMS for A. fib with RVR and possible stroke (code stroke activated due to left facial droop and slurred speech). Patient called out to sNF staff after getting up around 0440 to go to the bathroom, when [name] felt his heart racing, feeling SOB, having chest pressure, and feeling like [name] was about to pass out. [name] had similar episode a few days ago related to [facility], but was not noted to have facial droop or slurred speech at that time.  Patient noted to have A. fib RVR by EMS.  Given a dose of Cardizem in route.  HPI  Past Medical History Past Medical History:  Diagnosis Date  . Allergic rhinitis   . Arthritis    arthritis-kyphosis  . Asthmatic bronchitis   . CKD (chronic kidney disease) stage 3, GFR 30-59 ml/min   . Diabetes mellitus without complication ([place])   . DM type 2 (diabetes mellitus, type 2) ([place])   . DVT, lower extremity, proximal, acute ([place])   . Dyslipidemia   . GERD (gastroesophageal reflux disease)   . Hearing impaired    bilateral hearing aids  . History of kidney stones   . History of skin cancer   . Hypertension   . Hypothyroidism   . Macular degeneration    injections done bilaterally recent - 08-22-13  . PAF (paroxysmal atrial fibrillation) ([place])   . Prostate cancer [facility])    Prostate cancer-radiation only,skin cancer-basal cell and last squamous cell right eye  . Stroke ([place])   . Urgency-frequency syndrome   . Urticaria    Patient Active Problem List   Diagnosis Date Noted  . Symptomatic anemia 11/13/2018  . Parkinson's disease ([place]) 11/13/2018  . AF (paroxysmal atrial fibrillation) ([place])   . Coronary artery disease involving  native coronary artery of native heart without angina pectoris   . Hyperlipidemia LDL goal <70   . Atrial fibrillation with RVR ([place]) 08/14/2017  . Urinary tract infection with hematuria   . Non-ST elevation (NSTEMI) myocardial infarction ([place])   . GERD (gastroesophageal reflux disease) 08/04/2017  . Elevated troponin 08/03/2017  . Chest pain 08/03/2017  . CKD (chronic kidney disease), stage III 08/03/2017  . Type II diabetes mellitus with renal manifestations ([place]) 08/03/2017  . Pseudophakia of both eyes 05/31/2017  . Retained ureteral stent 04/22/2016  . Ureteral calculus 01/25/2016  . Benign essential HTN 10/24/2015  . CVA (cerebral vascular accident) ([place]) 10/14/2014  . TIA (transient ischemic attack)   . CME (cystoid macular edema), left 08/10/2013  . Squamous cell carcinoma of eyelid 05/15/2013  . Blepharitis 03/16/2013  . Posterior vitreous detachment of right eye 10/23/2011  . Exudative age-related macular degeneration ([place]) 07/28/2011  . Nonproliferative diabetic retinopathy ([place]) 04/07/2011  . Insomnia, unspecified 05/19/2010  . ATAXIA 03/17/2010  . DIZZINESS 03/06/2010  . Chronic constipation 02/21/2008  . OSTEOARTHRITIS, KNEE, SEVERE 01/12/2008  . Hypothyroidism (post surgical) 10/15/2006  . Diabetes mellitus, insulin dependent (IDDM), controlled 04/22/2006  . URTICARIA 04/22/2006  . POPLITEAL CYST 04/22/2006  . THYROIDECTOMY, SUBTOTAL, HX OF 04/22/2006  . TOTAL KNEE REPLACEMENT, HX OF 04/22/2006   Home Medication(s) Prior to Admission medications   Medication Sig Start Date End Date Taking?  Authorizing Provider  alfuzosin (UROXATRAL) 10 MG 24 hr tablet Take 1 tablet (10 mg total) by mouth at bedtime. Patient taking differently: Take 10 mg by mouth every evening.  02/10/16  Yes Kathie Rhodes, MD  Alogliptin Benzoate 12.5 MG TABS Take 12.5 mg by mouth daily.    Yes [provider]  apixaban (ELIQUIS) 2.5 MG TABS tablet Take 2.5 mg by mouth 2 (two) times  daily.   Yes [provider]  benzonatate (TESSALON) 100 MG capsule Take 100 mg by mouth every 8 (eight) hours as needed for cough.    Yes [provider]  brimonidine-timolol (COMBIGAN) 0.2-0.5 % ophthalmic solution Place 1 drop into both eyes 2 (two) times daily.   Yes [provider]  brinzolamide (AZOPT) 1 % ophthalmic suspension Place 1 drop into the right eye 3 (three) times daily.  09/03/16  Yes [provider]  carbidopa-levodopa (SINEMET IR) 25-100 MG tablet Take 1 tablet by mouth 3 (three) times daily.    Yes [provider]  Carboxymethylcellulose Sodium (ARTIFICIAL TEARS OP) Place 1 drop into both eyes 4 (four) times daily.   Yes [provider]  Cholecalciferol (VITAMIN D-3) 5000 units TABS Take 5,000 Units by mouth daily.   Yes [provider]  clopidogrel (PLAVIX) 75 MG tablet Take 75 mg by mouth daily.    Yes [provider]  colesevelam (WELCHOL) 625 MG tablet Take 1,875 mg by mouth every evening.    Yes [provider]  Cyanocobalamin (VITAMIN B-12) 2500 MCG SUBL Place 1 tablet under the tongue daily.   Yes [provider]  diltiazem (CARDIZEM CD) 120 MG 24 hr capsule Take 1 capsule (120 mg total) by mouth daily for 30 days. 03/12/18  Yes Elodia Florence., MD  docusate sodium (COLACE) 100 MG capsule Take 100 mg by mouth 2 (two) times daily.    Yes [provider]  ferrous sulfate 325 (65 FE) MG EC tablet Take 1 tablet (325 mg total) by mouth 2 (two) times daily with a meal. 11/14/18 11/14/19 Yes Danford, Suann Larry, MD  fluticasone (FLONASE) 50 MCG/ACT nasal spray Place 2 sprays into both nostrils daily.   Yes [provider]  furosemide (LASIX) 20 MG tablet Take 20 mg by mouth daily.   Yes [provider]  latanoprost (XALATAN) 0.005 % ophthalmic solution Place 1 drop into the right eye at bedtime.  07/28/16  Yes [provider]  levocetirizine (XYZAL) 5  MG tablet Take 2.5 mg by mouth at bedtime.  04/15/18  Yes [provider]  levothyroxine (SYNTHROID) 150 MCG tablet Take 150 mcg by mouth daily.    Yes [provider]  Melatonin 3 MG SUBL Place 3 mg under the tongue at bedtime. 11/11/18  Yes [provider]  Meth-Hyo-M Bl-Na Phos-Ph Sal (URO-MP) 118 MG CAPS Take 1 capsule by mouth 3 (three) times daily.   Yes [provider]  metoprolol succinate (TOPROL-XL) 25 MG 24 hr tablet Take 0.5 tablets (12.5 mg total) by mouth daily for 30 days. Patient taking differently: Take 25 mg by mouth daily. Hold if HR is < 50 03/12/18  Yes Elodia Florence., MD  mirabegron ER (MYRBETRIQ) 50 MG TB24 tablet Take 1 tablet (50 mg total) by mouth daily. 01/28/16  Yes McKenzie, Candee Furbish, MD  Multiple Vitamins-Minerals (PRESERVISION/LUTEIN) CAPS Take 1 capsule by mouth 2 (two) times daily.   Yes [provider]  pantoprazole (PROTONIX) 40 MG tablet Take 40 mg  by mouth daily. 06/29/19  Yes [provider]  polyethylene glycol (MIRALAX / GLYCOLAX) packet Take 17 g by mouth daily. [place]   Yes [provider]  potassium chloride (K-DUR,KLOR-CON) 10 MEQ tablet Take 10 mEq by mouth daily. Dissolve in applesauce   Yes [provider]  sertraline (ZOLOFT) 25 MG tablet Take 25 mg by mouth daily.   Yes [provider]  traMADol (ULTRAM) 50 MG tablet Take 1 tablet (50 mg total) by mouth every 6 (six) hours as needed. Patient taking differently: Take 50 mg by mouth every 6 (six) hours as needed for moderate pain.  12/22/18 12/22/19 Yes McKenzie, Candee Furbish, MD                                                                                                                                    Past Surgical History Past Surgical History:  Procedure Laterality Date  . BACK SURGERY     lumbar fracture  . cataract surgery Bilateral   . COLONOSCOPY W/ POLYPECTOMY     x2   . CORONARY STENT  INTERVENTION N/A 08/06/2017   Procedure: CORONARY STENT INTERVENTION;  Surgeon: Jettie Booze, MD;  Location: [facility];  Service: Cardiovascular;  Laterality: N/A;  . CYSTOSCOPY W/ URETERAL STENT PLACEMENT Right 08/13/2017   Procedure: CYSTOSCOPY WITH RIGHT  RETROGRADE PYELOGRAM/URETERAL STENT PLACEMENT;  Surgeon: Raynelle Bring, MD;  Location: [facility];  Service: Urology;  Laterality: Right;  . CYSTOSCOPY WITH INJECTION N/A 09/11/2013   Procedure: CYSTOSCOPY WITH BOTOX INJECTION INTO THE BLADDER;  Surgeon: Ailene Rud, MD;  Location: [facility];  Service: Urology;  Laterality: N/A;  . CYSTOSCOPY WITH RETROGRADE PYELOGRAM, URETEROSCOPY AND STENT PLACEMENT    . CYSTOSCOPY WITH RETROGRADE PYELOGRAM, URETEROSCOPY AND STENT PLACEMENT Right 12/22/2018   Procedure: CYSTOSCOPY WITH RETROGRADE PYELOGRAM, URETEROSCOPY AND STENT PLACEMENT;  Surgeon: Cleon Gustin, MD;  Location: [facility];  Service: Urology;  Laterality: Right;  90 MINS  . CYSTOSCOPY WITH STENT PLACEMENT Right 01/26/2016   Procedure: CYSTOSCOPY, RIGHT RETROGRADE WITH RIGHT URETERAL STENT PLACEMENT;  Surgeon: Cleon Gustin, MD;  [facility];  Service: Urology;  Laterality: Right;  . CYSTOSCOPY/RETROGRADE/URETEROSCOPY Right 04/22/2016   Procedure: CYSTOSCOPY/RETROGRADE/ REMOVAL JJ STENT right insertion stent right insertion of foley;  Surgeon: Franchot Gallo, MD;  Location: [facility];  Service: Urology;  Laterality: Right;  . EXTRACORPOREAL SHOCK WAVE LITHOTRIPSY Right 02/27/2016   Procedure: RIGHT EXTRACORPOREAL SHOCK WAVE LITHOTRIPSY (ESWL) GAITED;  Surgeon: Cleon Gustin, MD;  [facility]: [facility];  Service: Urology;  Laterality: Right;  . EXTRACORPOREAL SHOCK WAVE LITHOTRIPSY Right 02/10/2016   Procedure: EXTRACORPOREAL SHOCK WAVE LITHOTRIPSY (ESWL);  Surgeon: Kathie Rhodes, MD;  Location: [facility];  Service: Urology;  Laterality: Right;  [phone] [id number] A [employer]- [id number]   .  EYE SURGERY     macular degeneration  . GALLBLADDER SURGERY  2006   no  cholecystectomy  . HERNIA REPAIR    . HOLMIUM LASER APPLICATION Right 95/28/4132   Procedure: HOLMIUM LASER APPLICATION;  Surgeon: Cleon Gustin, MD;  [facility];  Service: Urology;  Laterality: Right;  . LEFT HEART CATH AND CORONARY ANGIOGRAPHY N/A 08/06/2017   Procedure: LEFT HEART CATH AND CORONARY ANGIOGRAPHY;  Surgeon: Jettie Booze, MD;  Location: [facility];  Service: Cardiovascular;  Laterality: N/A;  . LUNG REMOVAL, PARTIAL  age 53   for bronchiectasis  . PARTIAL THYMECTOMY  1985   for nodules  . POPLITEAL SYNOVIAL CYST EXCISION  2005  . TOTAL KNEE ARTHROPLASTY  2008   right  . TOTAL KNEE ARTHROPLASTY  2010   left   Family History Family History  Problem Relation Age of Onset  . Other Sister        ophth disease  . Heart attack Father        x7  . Heart disease Father        MI x7  . Hypertension Mother   . Stroke Mother 39  . Amblyopia Neg Hx   . Blindness Neg Hx   . Glaucoma Neg Hx   . Macular degeneration Neg Hx   . Retinal detachment Neg Hx   . Strabismus Neg Hx   . Retinitis pigmentosa Neg Hx   . Cataracts Neg Hx     Social History Social History   Tobacco Use  . Smoking status: Never Smoker  . Smokeless tobacco: Never Used  Substance Use Topics  . Alcohol use: No  . Drug use: No   Allergies Colchicine, Hibiclens [chlorhexidine], Atorvastatin, Ceftin, Celebrex [celecoxib], Codeine, Erythromycin, Ezetimibe, Iodinated diagnostic agents, Oxycodone-acetaminophen, Rosuvastatin, Simvastatin, Cefuroxime, Celebrex [celecoxib], Codeine, Colchicine, Crestor [rosuvastatin], Erythromycin, Hibiclens [chlorhexidine], Lipitor [atorvastatin], Nitroglycerin, Percocet [oxycodone-acetaminophen], Protonix [pantoprazole], Tetanus toxoids, Zetia [ezetimibe], Zocor [simvastatin], Cefpodoxime, Cefuroxime axetil, Nitroglycerin, Tetanus toxoid, and Vantin  Review of  Systems Review of Systems All other systems are reviewed and are negative for acute change except as noted in the HPI  Physical Exam Vital Signs  I have reviewed the triage vital signs BP 124/82   Pulse (!) 57   Temp (!) 97.3 F (36.3 C) (Oral)   Resp 15   Wt 71 kg   SpO2 99%   BMI 22.46 kg/m   Physical Exam Vitals reviewed.  Constitutional:      General: [name] is not in acute distress.    Appearance: [name] is well-developed. [name] is not diaphoretic.  HENT:     Head: Normocephalic and atraumatic.     Nose: Nose normal.  Eyes:     General: No scleral icterus.       Right eye: No discharge.        Left eye: No discharge.     Conjunctiva/sclera: Conjunctivae normal.     Pupils: Pupils are equal, round, and reactive to light.  Cardiovascular:     Rate and Rhythm: Normal rate. Rhythm regularly irregular.     Heart sounds: No murmur heard.  No friction rub. No gallop.   Pulmonary:     Effort: Pulmonary effort is normal. No respiratory distress.     Breath sounds: Normal breath sounds. No stridor. No rales.  Abdominal:     General: There is no distension.     Palpations: Abdomen is soft.     Tenderness: There is no abdominal tenderness.  Musculoskeletal:        General: No tenderness.     Cervical back: Normal range  of motion and neck supple.  Skin:    General: Skin is warm and dry.     Findings: No erythema or rash.  Neurological:     Mental Status: [name] is alert and oriented to person, place, and time.     Comments: Left facial droop. Moves all extremities with good strength. Detail exam by Neurology     ED Results and Treatments Labs (all labs ordered are listed, but only abnormal results are displayed) Labs Reviewed  CBC - Abnormal; Notable for the following components:      Result Value   RBC 3.89 (*)    Hemoglobin 12.1 (*)    HCT 37.8 (*)    All other components within normal limits  COMPREHENSIVE METABOLIC PANEL - Abnormal; Notable for the following components:    CO2 20 (*)    Glucose, Bld 132 (*)    BUN 36 (*)    Creatinine, Ser 1.65 (*)    Calcium 8.6 (*)    Total Protein 5.8 (*)    Albumin 3.3 (*)    AST 7 (*)    GFR calc non Af Amer 37 (*)    GFR calc Af Amer 43 (*)    All other components within normal limits  I-STAT CHEM 8, ED - Abnormal; Notable for the following components:   BUN 34 (*)    Creatinine, Ser 1.70 (*)    Glucose, Bld 126 (*)    Calcium, Ion 1.12 (*)    TCO2 21 (*)    Hemoglobin 12.2 (*)    HCT 36.0 (*)    All other components within normal limits  CBG MONITORING, ED - Abnormal; Notable for the following components:   Glucose-Capillary 128 (*)    All other components within normal limits  ETHANOL  PROTIME-INR  APTT  DIFFERENTIAL  RAPID URINE DRUG SCREEN, HOSP PERFORMED  URINALYSIS, ROUTINE W REFLEX MICROSCOPIC  TROPONIN I (HIGH SENSITIVITY)                                                                                                                         EKG  EKG Interpretation  Date/Time:  Thursday August 03 2019 05:56:51 EDT Ventricular Rate:  102 PR Interval:    QRS Duration: 88 QT Interval:  356 QTC Calculation: 464 R Axis:   81 Text Interpretation: Atrial fibrillation Borderline right axis deviation Left ventricular hypertrophy Confirmed by Addison Lank [phone]) on 08/03/2019 6:44:06 AM      Radiology CT HEAD CODE STROKE WO CONTRAST  Result Date: 08/03/2019 CLINICAL DATA:  Code stroke.  Left facial droop and slurred speech. EXAM: CT HEAD WITHOUT CONTRAST TECHNIQUE: Contiguous axial images were obtained from the base of the skull through the vertex without intravenous contrast. COMPARISON:  12/30/2018 FINDINGS: Brain: There is no evidence of acute infarct, intracranial hemorrhage, mass, midline shift, or extra-axial fluid collection. Hypodensities in the cerebral white matter bilaterally are unchanged and nonspecific but compatible with mild chronic small vessel ischemic disease.  Small chronic  infarcts are noted in the right thalamus and right cerebellum. There are dilated perivascular spaces in the basal ganglia. There is mild cerebral atrophy. Vascular: Calcified atherosclerosis at the skull base. No hyperdense vessel. Skull: No fracture or suspicious osseous lesion. Sinuses/Orbits: Small volume fluid in the left sphenoid sinus. Clear mastoid air cells. Bilateral cataract extraction. Other: None. ASPECTS [facility] Stroke Program Early CT Score) - Ganglionic level infarction (caudate, lentiform nuclei, internal capsule, insula, M1-M3 cortex): 7 - Supraganglionic infarction (M4-M6 cortex): 3 Total score (0-10 with 10 being normal): 10 IMPRESSION: 1. No evidence of acute intracranial abnormality. 2. ASPECTS is 10. 3. Mild chronic small vessel ischemic disease. These results were communicated to Dr. Leonel Ramsay at 6:03 am on 08/03/2019 by text page via the [facility] messaging system. Electronically Signed   By: Logan Bores M.D.   On: 08/03/2019 06:03    Pertinent labs & imaging results that were available during my care of the patient were reviewed by me and considered in my medical decision making (see chart for details).  Medications Ordered in ED Medications - No data to display                                                                                                                                  Procedures Procedures  (including critical care time)  Medical Decision Making / ED Course I have reviewed the nursing notes for this encounter and the patient's prior records (if available in EHR or on provided paperwork).   [name] was evaluated in Emergency Department on 08/03/2019 for the symptoms described in the history of present illness. [name] was evaluated in the context of the global COVID-19 pandemic, which necessitated consideration that the patient might be at risk for infection with the SARS-CoV-2 virus that causes COVID-19. Institutional protocols and algorithms that pertain  to the evaluation of patients at risk for COVID-19 are in a state of rapid change based on information released by regulatory bodies including the CDC and federal and state organizations. These policies and algorithms were followed during the patient's care in the ED.  Near syncope. AFRVR s/p cardizem by EMS, now with improved rate. Still in AF. Labs w/o significant electrolyte derangements. Stable renal function.  **patient had similar episode 2 days ago. Had Metop increased. Has scheduled appt with Cardiology AF clinic for follow up.  EKG with AF with mild tachycardia. trops pending.   Possible stroke vs recrudescence. CT negative. Plan for MRI.    Patient care turned over to Dr Ashok Cordia. Patient case and results discussed in detail; please see their note for further ED managment.      Final Clinical Impression(s) / ED Diagnoses Final diagnoses:  Atrial fibrillation with RVR (HCC)  Slurred speech  Facial droop      This chart was dictated using voice recognition software.  Despite best efforts to proofread,  errors can occur which can change  the documentation meaning.   Fatima Blank, MD 08/03/19 [phone]

## 2019-08-03 NOTE — Progress Notes (Signed)
[facility])  Mr. [name] is our current palliative care pt in the community.  [facility] will follow while he is hospitalized.  Please let us know if we can be of further assistance.  Venia Carbon RN, BSN, [facility] Liaison

## 2019-08-03 NOTE — Code Documentation (Signed)
Responded to Code Stroke called at 0523 for facial droop and slurred speech, [id number]. Pt arrived at 0534, NIH-3, CBG-128, CT head negative for acute changes. Plan for STAT MRI.

## 2019-08-03 NOTE — ED Notes (Signed)
Patient transported to MRI 

## 2019-08-03 NOTE — ED Provider Notes (Signed)
Signed out by Dr Leonette Monarch that patient w hx chr a fib, getting MRI at request of neurology, Dr Leonel Ramsay to r/o acute cva, and that patient also w rapid afib. EMS had given cardizem w improvement in rate initially.   Currently, HR 150s. No chest pain. cardizem bolus/gtt.   Hospitalists consulted for admission re recurrent/persistent rapid afib, near syncope, r/o cva.    Lajean Saver, MD 08/03/19 [phone]

## 2019-08-03 NOTE — Consult Note (Signed)
Neurology Consultation Reason for Consult: Facial droop Referring Physician: Cardama, P  CC: Facial droop  History is obtained from: EMS, staff at nursing home  HPI: [name] is a 86 y.o. male with a history of previous stroke as well as atrial fibrillation who was in his normal state of health when he ambulated to the bathroom at around 4:40 AM.  Subsequently on returning to bed, he cried out stating that he felt like he was about to die.  He complained of shortness of breath and chest pain and felt like he was about to pass out.  EMS was called for this, and he was given Cardizem on EMS arrival.  He apparently was having intermittent slurred speech and acting like he was about to pass out repeatedly throughout this episode.  Due to the slurred speech, a code stroke was activated.  Was also noted that he had some left facial droop which has only been documented once previously on a preprocedure anesthesia evaluation.   LKW: 4:40 am tpa given?: no, anticoagulated    ROS: A 14 point ROS was performed and is negative except as noted in the HPI.   Past Medical History:  Diagnosis Date  . Allergic rhinitis   . Arthritis    arthritis-kyphosis  . Asthmatic bronchitis   . CKD (chronic kidney disease) stage 3, GFR 30-59 ml/min   . Diabetes mellitus without complication ([place])   . DM type 2 (diabetes mellitus, type 2) ([place])   . DVT, lower extremity, proximal, acute ([place])   . Dyslipidemia   . GERD (gastroesophageal reflux disease)   . Hearing impaired    bilateral hearing aids  . History of kidney stones   . History of skin cancer   . Hypertension   . Hypothyroidism   . Macular degeneration    injections done bilaterally recent - 08-22-13  . PAF (paroxysmal atrial fibrillation) ([place])   . Prostate cancer [facility])    Prostate cancer-radiation only,skin cancer-basal cell and last squamous cell right eye  . Stroke ([place])   . Urgency-frequency syndrome   . Urticaria      Family  History  Problem Relation Age of Onset  . Other Sister        ophth disease  . Heart attack Father        x7  . Heart disease Father        MI x7  . Hypertension Mother   . Stroke Mother 7  . Amblyopia Neg Hx   . Blindness Neg Hx   . Glaucoma Neg Hx   . Macular degeneration Neg Hx   . Retinal detachment Neg Hx   . Strabismus Neg Hx   . Retinitis pigmentosa Neg Hx   . Cataracts Neg Hx      Social History:  reports that he has never smoked. He has never used smokeless tobacco. He reports that he does not drink alcohol and does not use drugs.   Exam: Current vital signs: Wt 71 kg   BMI 22.46 kg/m  Vital signs in last 24 hours: Weight:  [71 kg] 71 kg (07/22 0547)   Physical Exam  Constitutional: Appears well-developed and well-nourished.  Psych: Affect appropriate to situation Eyes: No scleral injection HENT: No OP obstrucion MSK: no joint deformities.  Cardiovascular: Normal rate and regular rhythm.  Respiratory: Effort normal, non-labored breathing GI: Soft.  No distension. There is no tenderness.  Skin: WDI  Neuro: Mental Status: Patient is awake, alert, oriented to person,  place, month, year, and situation. Patient is able to give a clear and coherent history. No signs of aphasia or neglect He gives his age is 62. Cranial Nerves: II: Visual Fields are full. Pupils are equal, round, and reactive to light.   III,IV, VI: EOMI without ptosis or diploplia.  V: Facial sensation is symmetric to temperature VII: Facial movement with left facial weakness VIII: hearing is intact to voice X: Uvula elevates symmetrically XI: Shoulder shrug is symmetric. XII: tongue is midline without atrophy or fasciculations.  Motor: Tone is normal. Bulk is normal. 5/5 strength was present in all four extremities.  Sensory: Sensation is symmetric to light touch and temperature in the arms and legs. Cerebellar: FNF intact bilaterally    I have reviewed labs in epic and the  results pertinent to this consultation are: Cr 1.7  I have reviewed the images obtained: CT - negative  Impression: 86 year old male with recurrent episodes of what sounds like presyncope in the setting of A. fib with RVR.  The fact that his facial droop appears worse than typical does make me wonder if he could if had a new acute ischemic infarct.  Also possible would be recrudescence of previous symptoms.  An MRI could help differentiate these two, but if this is negative then I would not pursue any further acute ischemic stroke work-up.  Recommendations: 1) MRI brain 2) if negative then no further stroke work-up is needed.   Roland Rack, MD [facility] [phone]  If 7pm- 7am, please page neurology on call as listed in [place].

## 2019-08-03 NOTE — Telephone Encounter (Signed)
Received update from Dottie, nurse at [facility], that patient was sent to ED this morning. Primary Palliative NP and hospital liaisons updated.

## 2019-08-03 NOTE — H&P (Signed)
History and Physical    [name] XNT:[id number] DOB: Nov 28, 1931 DOA: 08/03/2019  PCP: Patient, No Pcp Per Consultants:  Regal - podiatry; palliative care; McKenzie - urology; Turner - cardiology Patient coming from: [facility];  NOK: [name], [phone]  Chief Complaint: near syncope  HPI: [name] is a 86 y.o. male with medical history significant of CVA; prostate CA; afib; hypothyroidism; HTN; HLD; DM; and stage 3 CKD presenting with near syncope, slurred speech, L facial droop.  He reports that he was feeling fine yesterday and overnight.  He got up to shave this AM about 0400 and felt weak and fatigued and so went back to bed.  He has long-standing afib and acknowledges current RVR.  No chest pain.    ED Course:   Came in as code stroke, also in afib with RVR, had near syncope.  Dr. Leonel Ramsay consulted and recommended MRI - negative, no further evaluation needed.  Given Cardizem for afib with some improvement but recurred and now on Cardizem drip.  Review of Systems: As per HPI; otherwise review of systems reviewed and negative.    COVID Vaccine Status:  Complete  Past Medical History:  Diagnosis Date  . Allergic rhinitis   . Arthritis    arthritis-kyphosis  . Asthmatic bronchitis   . CAD in native artery    a. STEMI 07/2017 s/p DES to LCx (25% mRCA and 70% oOM1 residual).  . CKD (chronic kidney disease) stage 3, GFR 30-59 ml/min   . DM type 2 (diabetes mellitus, type 2) ([place])   . DVT, lower extremity, proximal, acute ([place])   . Dyslipidemia   . GERD (gastroesophageal reflux disease)   . Hearing impaired    bilateral hearing aids  . History of kidney stones   . History of skin cancer   . Hypertension   . Hypothyroidism   . Macular degeneration    injections done bilaterally recent - 08-22-13  . Normocytic anemia   . PAF (paroxysmal atrial fibrillation) ([place])   . Parkinson's disease ([place])   . Prostate cancer [facility])    Prostate  cancer-radiation only,skin cancer-basal cell and last squamous cell right eye  . Statin intolerance   . Stroke ([name])   . Urgency-frequency syndrome   . Urticaria     Past Surgical History:  Procedure Laterality Date  . BACK SURGERY     lumbar fracture  . cataract surgery Bilateral   . COLONOSCOPY W/ POLYPECTOMY     x2   . CORONARY STENT INTERVENTION N/A 08/06/2017   Procedure: CORONARY STENT INTERVENTION;  Surgeon: Jettie Booze, MD;  Location: [place] [facility];  Service: Cardiovascular;  Laterality: N/A;  . CYSTOSCOPY W/ URETERAL STENT PLACEMENT Right 08/13/2017   Procedure: CYSTOSCOPY WITH RIGHT  RETROGRADE PYELOGRAM/URETERAL STENT PLACEMENT;  Surgeon: Raynelle Bring, MD;  Location: [facility];  Service: Urology;  Laterality: Right;  . CYSTOSCOPY WITH INJECTION N/A 09/11/2013   Procedure: CYSTOSCOPY WITH BOTOX INJECTION INTO THE BLADDER;  Surgeon: Ailene Rud, MD;  Location: [facility];  Service: Urology;  Laterality: N/A;  . CYSTOSCOPY WITH RETROGRADE PYELOGRAM, URETEROSCOPY AND STENT PLACEMENT    . CYSTOSCOPY WITH RETROGRADE PYELOGRAM, URETEROSCOPY AND STENT PLACEMENT Right 12/22/2018   Procedure: CYSTOSCOPY WITH RETROGRADE PYELOGRAM, URETEROSCOPY AND STENT PLACEMENT;  Surgeon: Cleon Gustin, MD;  [facility];  Service: Urology;  Laterality: Right;  90 MINS  . CYSTOSCOPY WITH STENT PLACEMENT Right 01/26/2016   Procedure: CYSTOSCOPY, RIGHT RETROGRADE WITH RIGHT URETERAL STENT  PLACEMENT;  Surgeon: Cleon Gustin, MD;  [facility];  Service: Urology;  Laterality: Right;  . CYSTOSCOPY/RETROGRADE/URETEROSCOPY Right 04/22/2016   Procedure: CYSTOSCOPY/RETROGRADE/ REMOVAL JJ STENT right insertion stent right insertion of foley;  Surgeon: Franchot Gallo, MD;  [facility];  Service: Urology;  Laterality: Right;  . EXTRACORPOREAL SHOCK WAVE LITHOTRIPSY Right 02/27/2016   Procedure: RIGHT EXTRACORPOREAL SHOCK WAVE LITHOTRIPSY (ESWL) GAITED;  Surgeon: Cleon Gustin, MD;  [facility];  Service: Urology;  Laterality: Right;  . EXTRACORPOREAL SHOCK WAVE LITHOTRIPSY Right 02/10/2016   Procedure: EXTRACORPOREAL SHOCK WAVE LITHOTRIPSY (ESWL);  Surgeon: Kathie Rhodes, MD;  [facility];  Service: Urology;  Laterality: Right;  [facility]-[phone] [id number] BCBS- [id number]   . EYE SURGERY     macular degeneration  . GALLBLADDER SURGERY  2006   no cholecystectomy  . HERNIA REPAIR    . HOLMIUM LASER APPLICATION Right 63/14/9702   Procedure: HOLMIUM LASER APPLICATION;  Surgeon: Cleon Gustin, MD;  [facility];  Service: Urology;  Laterality: Right;  . LEFT HEART CATH AND CORONARY ANGIOGRAPHY N/A 08/06/2017   Procedure: LEFT HEART CATH AND CORONARY ANGIOGRAPHY;  Surgeon: Jettie Booze, MD;  Location: [place] [facility];  Service: Cardiovascular;  Laterality: N/A;  . LUNG REMOVAL, PARTIAL  age 19   for bronchiectasis  . PARTIAL THYMECTOMY  1985   for nodules  . POPLITEAL SYNOVIAL CYST EXCISION  2005  . TOTAL KNEE ARTHROPLASTY  2008   right  . TOTAL KNEE ARTHROPLASTY  2010   left    Social History   Socioeconomic History  . Marital status: Married    Spouse name: Not on file  . Number of children: 2  . Years of education: Not on file  . Highest education level: Not on file  Occupational History  . Occupation: retired  Tobacco Use  . Smoking status: Never Smoker  . Smokeless tobacco: Never Used  Substance and Sexual Activity  . Alcohol use: No  . Drug use: No  . Sexual activity: Not Currently  Other Topics Concern  . Not on file  Social History Narrative   ** Merged History Encounter **       MUST HAVE MEDS LISTED DO NOT SUBSTITUTE GENERICS PATIENT IS ALLERGIC TO DYES   Social Determinants of Health   Financial Resource Strain:   . Difficulty of Paying Living Expenses:   Food Insecurity:   . Worried About Charity fundraiser in the Last Year:   . Arboriculturist in the Last  Year:   Transportation Needs:   . Film/video editor (Medical):   Marland Kitchen Lack of Transportation (Non-Medical):   Physical Activity:   . Days of Exercise per Week:   . Minutes of Exercise per Session:   Stress:   . Feeling of Stress :   Social Connections:   . Frequency of Communication with Friends and Family:   . Frequency of Social Gatherings with Friends and Family:   . Attends Religious Services:   . Active Member of Clubs or Organizations:   . Attends Archivist Meetings:   Marland Kitchen Marital Status:   Intimate Partner Violence:   . Fear of Current or Ex-Partner:   . Emotionally Abused:   Marland Kitchen Physically Abused:   . Sexually Abused:     Allergies  Allergen Reactions  . Colchicine Anaphylaxis  . Hibiclens [Chlorhexidine] Hives  . Atorvastatin Other (See Comments)    Joint pain   .  Ceftin Diarrhea  . Celebrex [Celecoxib] Swelling  . Codeine Hives  . Erythromycin Diarrhea  . Ezetimibe Other (See Comments)    Joint pain   . Iodinated Diagnostic Agents Nausea And Vomiting and Rash  . Oxycodone-Acetaminophen Hives  . Rosuvastatin Other (See Comments)    Joint pain   . Simvastatin Other (See Comments)    Joint pain   . Cefuroxime   . Celebrex [Celecoxib]   . Codeine   . Colchicine   . Crestor [Rosuvastatin]   . Erythromycin   . Hibiclens [Chlorhexidine]   . Lipitor [Atorvastatin]   . Nitroglycerin   . Percocet [Oxycodone-Acetaminophen]   . Protonix [Pantoprazole]   . Tetanus Toxoids   . Zetia [Ezetimibe]   . Zocor [Simvastatin]   . Cefpodoxime Rash  . Cefuroxime Axetil Rash  . Nitroglycerin Other (See Comments)    Lowers patient's B/P  . Tetanus Toxoid Rash  . Vantin Other (See Comments)    Unknown/not noted on MAR??    Family History  Problem Relation Age of Onset  . Other Sister        ophth disease  . Heart attack Father        x7  . Heart disease Father        MI x7  . Hypertension Mother   . Stroke Mother 85  . Amblyopia Neg Hx   . Blindness  Neg Hx   . Glaucoma Neg Hx   . Macular degeneration Neg Hx   . Retinal detachment Neg Hx   . Strabismus Neg Hx   . Retinitis pigmentosa Neg Hx   . Cataracts Neg Hx     Prior to Admission medications   Medication Sig Start Date End Date Taking? Authorizing Provider  alfuzosin (UROXATRAL) 10 MG 24 hr tablet Take 1 tablet (10 mg total) by mouth at bedtime. Patient taking differently: Take 10 mg by mouth every evening.  02/10/16  Yes Kathie Rhodes, MD  Alogliptin Benzoate 12.5 MG TABS Take 12.5 mg by mouth daily.    Yes [provider]  apixaban (ELIQUIS) 2.5 MG TABS tablet Take 2.5 mg by mouth 2 (two) times daily.   Yes [provider]  benzonatate (TESSALON) 100 MG capsule Take 100 mg by mouth every 8 (eight) hours as needed for cough.    Yes [provider]  brimonidine-timolol (COMBIGAN) 0.2-0.5 % ophthalmic solution Place 1 drop into both eyes 2 (two) times daily.   Yes [provider]  brinzolamide (AZOPT) 1 % ophthalmic suspension Place 1 drop into the right eye 3 (three) times daily.  09/03/16  Yes [provider]  carbidopa-levodopa (SINEMET IR) 25-100 MG tablet Take 1 tablet by mouth 3 (three) times daily.    Yes [provider]  Carboxymethylcellulose Sodium (ARTIFICIAL TEARS OP) Place 1 drop into both eyes 4 (four) times daily.   Yes [provider]  Cholecalciferol (VITAMIN D-3) 5000 units TABS Take 5,000 Units by mouth daily.   Yes [provider]  clopidogrel (PLAVIX) 75 MG tablet Take 75 mg by mouth daily.    Yes [provider]  colesevelam (WELCHOL) 625 MG tablet Take 1,875 mg by mouth every evening.    Yes [provider]  Cyanocobalamin (VITAMIN B-12) 2500 MCG SUBL Place 1 tablet under the tongue daily.   Yes [provider]  diltiazem (CARDIZEM CD) 120 MG 24 hr capsule Take 1 capsule (120 mg total) by mouth daily for 30 days. 03/12/18  Yes Alben Deeds  Brooke Bonito., MD  docusate  sodium (COLACE) 100 MG capsule Take 100 mg by mouth 2 (two) times daily.    Yes [provider]  ferrous sulfate 325 (65 FE) MG EC tablet Take 1 tablet (325 mg total) by mouth 2 (two) times daily with a meal. 11/14/18 11/14/19 Yes Danford, Suann Larry, MD  fluticasone (FLONASE) 50 MCG/ACT nasal spray Place 2 sprays into both nostrils daily.   Yes [provider]  furosemide (LASIX) 20 MG tablet Take 20 mg by mouth daily.   Yes [provider]  latanoprost (XALATAN) 0.005 % ophthalmic solution Place 1 drop into the right eye at bedtime.  07/28/16  Yes [provider]  levocetirizine (XYZAL) 5 MG tablet Take 2.5 mg by mouth at bedtime.  04/15/18  Yes [provider]  levothyroxine (SYNTHROID) 150 MCG tablet Take 150 mcg by mouth daily.    Yes [provider]  Melatonin 3 MG SUBL Place 3 mg under the tongue at bedtime. 11/11/18  Yes [provider]  Meth-Hyo-M Bl-Na Phos-Ph Sal (URO-MP) 118 MG CAPS Take 1 capsule by mouth 3 (three) times daily.   Yes [provider]  metoprolol succinate (TOPROL-XL) 25 MG 24 hr tablet Take 0.5 tablets (12.5 mg total) by mouth daily for 30 days. Patient taking differently: Take 25 mg by mouth daily. Hold if HR is < 50 03/12/18  Yes Elodia Florence., MD  mirabegron ER (MYRBETRIQ) 50 MG TB24 tablet Take 1 tablet (50 mg total) by mouth daily. 01/28/16  Yes McKenzie, Candee Furbish, MD  Multiple Vitamins-Minerals (PRESERVISION/LUTEIN) CAPS Take 1 capsule by mouth 2 (two) times daily.   Yes [provider]  pantoprazole (PROTONIX) 40 MG tablet Take 40 mg by mouth daily. 06/29/19  Yes [provider]  polyethylene glycol (MIRALAX / GLYCOLAX) packet Take 17 g by mouth daily. [place]   Yes [provider]  potassium chloride (K-DUR,KLOR-CON) 10 MEQ tablet Take 10 mEq by mouth daily. Dissolve in applesauce   Yes [provider]  sertraline (ZOLOFT) 25 MG tablet Take 25 mg  by mouth daily.   Yes [provider]  traMADol (ULTRAM) 50 MG tablet Take 1 tablet (50 mg total) by mouth every 6 (six) hours as needed. Patient taking differently: Take 50 mg by mouth every 6 (six) hours as needed for moderate pain.  12/22/18 12/22/19 Yes McKenzieCandee Furbish, MD    Physical Exam: Vitals:   08/03/19 1310 08/03/19 1440 08/03/19 1500 08/03/19 1540  BP: (!) 143/80 (!) 117/88 119/80 (!) 138/87  Pulse: 104 65 49 80  Resp: 21 20 22 23   Temp:      TempSrc:      SpO2: 97% 97% 96% 97%  Weight:         . General:  Appears calm and comfortable and is NAD . Eyes:  EOMI, normal lids, iris . ENT:  grossly normal hearing, lips & tongue, mmm . Neck:  no LAD, masses or thyromegaly . Cardiovascular:  Irregularly irregular rate/rhythm with mild tachycardia, no m/r/g. 1+ LE edema.  Marland Kitchen Respiratory:   CTA bilaterally with no wheezes/rales/rhonchi.  Normal respiratory effort. . Abdomen:  soft, NT, ND, NABS . Skin:  no rash or induration seen on limited exam . Musculoskeletal:  grossly normal tone BUE/BLE, good ROM, no bony abnormality . Psychiatric:  grossly normal mood and affect, speech fluent and appropriate, AOx3 . Neurologic:  CN 2-12 grossly intact, moves all extremities in coordinated fashion  Radiological Exams on Admission: MR BRAIN WO CONTRAST  Result Date: 08/03/2019 CLINICAL DATA:  Neuro deficit. EXAM: MRI HEAD WITHOUT CONTRAST TECHNIQUE: Multiplanar, multiecho pulse sequences of the brain and surrounding structures were obtained without intravenous contrast. COMPARISON:  08/03/2019 head CT.  10/14/2014 MRI head. FINDINGS: Brain: Moderate diffuse parenchymal volume loss with ex vacuo dilatation. Remote right corona radiata, right thalamic and right cerebellar infarcts. Scattered and confluent supratentorial white matter T2/FLAIR hyperintense foci. No acute infarct. Tiny remote right frontal and left cerebellar microhemorrhages. No midline shift or extra-axial fluid  collection. No mass lesion. Vascular: Normal flow voids. Skull and upper cervical spine: Normal marrow signal. Sinuses/Orbits: Normal orbits. Sequela of bilateral lens replacement. Mild ethmoid sinus mucosal thickening with layering left sphenoid secretions. Trace right mastoid effusion. Other: None. IMPRESSION: No acute intracranial process. Small remote infarcts involving the right corona radiata, right thalamus and right cerebellum. Moderate cerebral atrophy. Mild chronic microvascular ischemic changes. Electronically Signed   By: Primitivo Gauze M.D.   On: 08/03/2019 08:17   CT HEAD CODE STROKE WO CONTRAST  Result Date: 08/03/2019 CLINICAL DATA:  Code stroke.  Left facial droop and slurred speech. EXAM: CT HEAD WITHOUT CONTRAST TECHNIQUE: Contiguous axial images were obtained from the base of the skull through the vertex without intravenous contrast. COMPARISON:  12/30/2018 FINDINGS: Brain: There is no evidence of acute infarct, intracranial hemorrhage, mass, midline shift, or extra-axial fluid collection. Hypodensities in the cerebral white matter bilaterally are unchanged and nonspecific but compatible with mild chronic small vessel ischemic disease. Small chronic infarcts are noted in the right thalamus and right cerebellum. There are dilated perivascular spaces in the basal ganglia. There is mild cerebral atrophy. Vascular: Calcified atherosclerosis at the skull base. No hyperdense vessel. Skull: No fracture or suspicious osseous lesion. Sinuses/Orbits: Small volume fluid in the left sphenoid sinus. Clear mastoid air cells. Bilateral cataract extraction. Other: None. ASPECTS [facility] Stroke Program Early CT Score) - Ganglionic level infarction (caudate, lentiform nuclei, internal capsule, insula, M1-M3 cortex): 7 - Supraganglionic infarction (M4-M6 cortex): 3 Total score (0-10 with 10 being normal): 10 IMPRESSION: 1. No evidence of acute intracranial abnormality. 2. ASPECTS is 10. 3. Mild chronic  small vessel ischemic disease. These results were communicated to Dr. Leonel Ramsay at 6:03 am on 08/03/2019 by text page via the [facility] messaging system. Electronically Signed   By: Logan Bores M.D.   On: 08/03/2019 06:03    EKG: Independently reviewed.   0556 - Afib with rate 102; LVH with no evidence of acute ischemia 0822 - Afib with rate 153; nonspecific ST changes which may be rate related  Labs on Admission: I have personally reviewed the available labs and imaging studies at the time of the admission.  Pertinent labs:   Glucose 132 BUN 36/Creatinine 1.65/GFR 37 - stable Albumin 3.3 HS troponin 20, 19, 19, 21 Unremarkable CBC INR 1.1 TSH 1.064 UA: 5 ketones, trace LE UDS negatve   Assessment/Plan Principal Problem:   Atrial fibrillation with RVR (HCC) Active Problems:   Hypothyroidism (post surgical)   Benign essential HTN   CKD (chronic kidney disease), stage III   Type II diabetes mellitus with renal manifestations (HCC)   Hyperlipidemia LDL goal <70   Parkinson's disease ([place])   Near syncope   Afib with RVR  -Patient with known afib presenting with near syncope; given that he is in persistent RVR, suspect that this was the etiology for his episode -Since the afib onset is unknown, will focus on rate control and  searching for the underlying cause at this time. -Will plan to place in observation status in SDU for Diltiazem drip as per protocol with plan to transition to PO Diltiazem once heart rate is controlled.  -Repeat EKG in AM  -Will consult cardiology  -CHA2DS2-VASc Score is >2 and so will continue Eliquis. -Continue Plavix  Near syncope  -Appears to be related to RVR -Will monitor on telemetry -Will watch for recurrent episodes -PT/OT eval and treat  HTN -Hold PO Cardizem with plan to resume once rate controlled -Continue Toprol XL -Hold Lasix  HLD -Continue Welchol  DM -Last A1c was 5.7 -Hold Alogliptin -Will cover with sensitive-scale SSI for  now  Stage 3b CKD -Appears to be at/near baseline at this time -Will follow  Parkinson's disease -Continue Sinemet -Autonomic dysfunction is common with Parkinson's and may have contributed to his episode at 0400 this AM -Will check orthostatics tomorrow AM  Hypothyroidism -Normal TSH -Continue Synthroid at current dose for now    Note: This patient has been tested and is negative for the novel coronavirus COVID-19.   DVT prophylaxis:  Eliquis Code Status:  DNR - confirmed with patient Family Communication: None present Disposition Plan:  The patient is from: [facility]  Anticipated d/c is to: ALF  Anticipated d/c date will depend on clinical response to treatment, but possibly as early as tomorrow if she has excellent response to treatment  Patient is currently: acutely ill Consults called: Cardiology; PT/OT  Admission status: It is my clinical opinion that referral for OBSERVATION is reasonable and necessary in this patient based on the above information provided. The aforementioned taken together are felt to place the patient at high risk for further clinical deterioration. However it is anticipated that the patient may be medically stable for discharge from the hospital within 24 to 48 hours.     Karmen Bongo MD [facility]   How to contact the [facility] Attending or Consulting provider [place] or covering provider during after hours [place], for this patient?  1. Check the care team in [facility] and look for a) attending/consulting TR[facility] provider listed and b) the [facility] team listed 2. Log into [email or web address] and use [place]'s universal password to access. If you do not have the password, please contact the hospital operator. 3. Locate the [facility] provider you are looking for under [facility] and page to a number that you can be directly reached. 4. If you still have difficulty reaching the provider, please page the [facility] (Director on Call) for the Hospitalists listed  on amion for assistance.   08/03/2019, 4:27 PM

## 2019-08-03 NOTE — Progress Notes (Signed)
Brief Neuro Update and signoff note:  He is sleeping in the bed. Denies any complaints. MRI Brain reviewed and is negative for an acute stroke. His orthostatic vitals were positive with HR increasing from 100 to 140 when standing up and somewhat suggestive of presyncope.  At this time, neurology inpatient team will signoff. Please feel free to contact us with any questions or concerns.  [place] Pager Number [phone]

## 2019-08-04 DIAGNOSIS — Z79890 Hormone replacement therapy (postmenopausal): Secondary | ICD-10-CM | POA: Diagnosis not present

## 2019-08-04 DIAGNOSIS — G20 Parkinson's disease: Secondary | ICD-10-CM | POA: Diagnosis present

## 2019-08-04 DIAGNOSIS — Z20822 Contact with and (suspected) exposure to covid-19: Secondary | ICD-10-CM | POA: Diagnosis present

## 2019-08-04 DIAGNOSIS — D649 Anemia, unspecified: Secondary | ICD-10-CM | POA: Diagnosis present

## 2019-08-04 DIAGNOSIS — H9190 Unspecified hearing loss, unspecified ear: Secondary | ICD-10-CM | POA: Diagnosis present

## 2019-08-04 DIAGNOSIS — M1990 Unspecified osteoarthritis, unspecified site: Secondary | ICD-10-CM | POA: Diagnosis present

## 2019-08-04 DIAGNOSIS — I480 Paroxysmal atrial fibrillation: Secondary | ICD-10-CM | POA: Diagnosis present

## 2019-08-04 DIAGNOSIS — I4891 Unspecified atrial fibrillation: Secondary | ICD-10-CM | POA: Diagnosis not present

## 2019-08-04 DIAGNOSIS — E039 Hypothyroidism, unspecified: Secondary | ICD-10-CM | POA: Diagnosis present

## 2019-08-04 DIAGNOSIS — Z79899 Other long term (current) drug therapy: Secondary | ICD-10-CM | POA: Diagnosis not present

## 2019-08-04 DIAGNOSIS — Z66 Do not resuscitate: Secondary | ICD-10-CM | POA: Diagnosis present

## 2019-08-04 DIAGNOSIS — I252 Old myocardial infarction: Secondary | ICD-10-CM | POA: Diagnosis not present

## 2019-08-04 DIAGNOSIS — Z7901 Long term (current) use of anticoagulants: Secondary | ICD-10-CM | POA: Diagnosis not present

## 2019-08-04 DIAGNOSIS — R55 Syncope and collapse: Secondary | ICD-10-CM | POA: Diagnosis present

## 2019-08-04 DIAGNOSIS — Z515 Encounter for palliative care: Secondary | ICD-10-CM | POA: Diagnosis present

## 2019-08-04 DIAGNOSIS — I2510 Atherosclerotic heart disease of native coronary artery without angina pectoris: Secondary | ICD-10-CM | POA: Diagnosis present

## 2019-08-04 DIAGNOSIS — K219 Gastro-esophageal reflux disease without esophagitis: Secondary | ICD-10-CM | POA: Diagnosis present

## 2019-08-04 DIAGNOSIS — I129 Hypertensive chronic kidney disease with stage 1 through stage 4 chronic kidney disease, or unspecified chronic kidney disease: Secondary | ICD-10-CM | POA: Diagnosis present

## 2019-08-04 DIAGNOSIS — E1122 Type 2 diabetes mellitus with diabetic chronic kidney disease: Secondary | ICD-10-CM | POA: Diagnosis present

## 2019-08-04 DIAGNOSIS — Z87892 Personal history of anaphylaxis: Secondary | ICD-10-CM | POA: Diagnosis not present

## 2019-08-04 DIAGNOSIS — R4781 Slurred speech: Secondary | ICD-10-CM | POA: Diagnosis present

## 2019-08-04 DIAGNOSIS — N1832 Chronic kidney disease, stage 3b: Secondary | ICD-10-CM | POA: Diagnosis present

## 2019-08-04 DIAGNOSIS — Z7902 Long term (current) use of antithrombotics/antiplatelets: Secondary | ICD-10-CM | POA: Diagnosis not present

## 2019-08-04 DIAGNOSIS — Z8546 Personal history of malignant neoplasm of prostate: Secondary | ICD-10-CM | POA: Diagnosis not present

## 2019-08-04 DIAGNOSIS — H3530 Unspecified macular degeneration: Secondary | ICD-10-CM | POA: Diagnosis present

## 2019-08-04 DIAGNOSIS — E785 Hyperlipidemia, unspecified: Secondary | ICD-10-CM | POA: Diagnosis present

## 2019-08-04 LAB — BASIC METABOLIC PANEL
Anion gap: 7 (ref 5–15)
BUN: 29 mg/dL — ABNORMAL HIGH (ref 8–23)
CO2: 24 mmol/L (ref 22–32)
Calcium: 8.8 mg/dL — ABNORMAL LOW (ref 8.9–10.3)
Chloride: 107 mmol/L (ref 98–111)
Creatinine, Ser: 1.39 mg/dL — ABNORMAL HIGH (ref 0.61–1.24)
GFR calc Af Amer: 53 mL/min — ABNORMAL LOW (ref >60)
GFR calc non Af Amer: 46 mL/min — ABNORMAL LOW (ref >60)
Glucose, Bld: 97 mg/dL (ref 70–99)
Potassium: 3.9 mmol/L (ref 3.5–5.1)
Sodium: 138 mmol/L (ref 135–145)

## 2019-08-04 LAB — CBC
HCT: 36.1 % — ABNORMAL LOW (ref 39.0–52.0)
Hemoglobin: 11.6 g/dL — ABNORMAL LOW (ref 13.0–17.0)
MCH: 30.7 pg (ref 26.0–34.0)
MCHC: 32.1 g/dL (ref 30.0–36.0)
MCV: 95.5 fL (ref 80.0–100.0)
Platelets: 179 10*3/uL (ref 150–400)
RBC: 3.78 MIL/uL — ABNORMAL LOW (ref 4.22–5.81)
RDW: 12.2 % (ref 11.5–15.5)
WBC: 5 10*3/uL (ref 4.0–10.5)
nRBC: 0 % (ref 0.0–0.2)

## 2019-08-04 LAB — GLUCOSE, CAPILLARY
Glucose-Capillary: 96 mg/dL (ref 70–99)
Glucose-Capillary: 169 mg/dL — ABNORMAL HIGH (ref 70–99)
Glucose-Capillary: 149 mg/dL — ABNORMAL HIGH (ref 70–99)
Glucose-Capillary: 79 mg/dL (ref 70–99)

## 2019-08-04 MED ORDER — AMIODARONE HCL 200 MG PO TABS
200.0000 mg | ORAL_TABLET | Freq: Two times a day (BID) | ORAL | Status: DC
  Administered 2019-08-04 – 2019-08-05 (×2): 200 mg via ORAL
  Filled 2019-08-04 (×2): qty 1

## 2019-08-04 MED ORDER — DILTIAZEM HCL ER COATED BEADS 120 MG PO CP24
120.0000 mg | ORAL_CAPSULE | Freq: Every day | ORAL | Status: DC
  Administered 2019-08-04 – 2019-08-05 (×2): 120 mg via ORAL
  Filled 2019-08-04 (×2): qty 1

## 2019-08-04 MED ORDER — MUSCLE RUB 10-15 % EX CREA
TOPICAL_CREAM | CUTANEOUS | Status: DC | PRN
  Filled 2019-08-04: qty 85

## 2019-08-04 NOTE — Progress Notes (Signed)
Patient stating both heels are burning, he typically applies "maxifreeze" for the burning sensation. He said it works like a Nutritional therapist. Paged [facility], got orders for BEN-GAY. Patient feels like this medication does not work. He called his sister for her to bring it. At this moment, Ortho vitals cannot be done. Will inform oncoming nurse.

## 2019-08-04 NOTE — Assessment & Plan Note (Signed)
-  Baseline creatinine 1.4-1.6, currently at baseline -Avoid nephrotoxic agents as able -Continue following BMP daily

## 2019-08-04 NOTE — Progress Notes (Signed)
PROGRESS NOTE    [name]   TOI:[id number]  DOB: 08-20-1931  DOA: 08/03/2019     0  PCP: Patient, No Pcp Per  CC: afib with RVR  Hospital Course: [name] is an 86 y o CM with PMH afib, CVA, prostate cancer, hypothyroidism, hypertension, hyperlipidemia, type 2 diabetes, CKD 3 who presented to the hospital with a near syncopal event, slurred speech, and left facial droop.  He was noted to be fine the day prior to admission and overnight however upon waking up around 4 AM the morning of admission he was noted to be weak and fatigued.  On work-up he was found to be in A. fib with RVR which he converts in and out of intermittently.  He follows closely outpatient with cardiology.  He was started on a Cardizem infusion on admission due to RVR.  He converted back to normal sinus rhythm spontaneously.  Given his increased ability for converting to RVR, he was started on amiodarone and transitioned back to oral Cardizem.  His iodine allergy was not considered to be significant in the setting of initiating amiodarone after discussion with the cardiology pharmacist.   Interval History:  No events overnight.  He converted to NSR while on Cardizem drip.  Sitting up in chair at bedside this morning in no distress.  Denies any chest pain or shortness of breath.  Old records reviewed in assessment of this patient  ROS: Constitutional: negative for chills and fevers, Respiratory: negative for cough and pleurisy/chest pain, Cardiovascular: negative for chest pain and Gastrointestinal: negative for abdominal pain  Assessment & Plan: Hypothyroidism (post surgical) -Continue Synthroid  Benign essential HTN Continue current regimen  CKD (chronic kidney disease), stage III -Baseline creatinine 1.4-1.6, currently at baseline -Avoid nephrotoxic agents as able -Continue following BMP daily  Type II diabetes mellitus with renal manifestations ([place]) -Continue SSI and POC glucose  Atrial  fibrillation with RVR ([place]) -Follows outpatient with cardiology, appreciate assistance -Initiated on a Cardizem infusion on admission and responded well.  He converted back to NSR spontaneously -Cardiology has discussed amiodarone with pharmacy regarding initial concern for interaction with his iodine allergy; no concern after discussion -Continue Cardizem and Eliquis -Amiodarone being initiated per cardiology  Hyperlipidemia LDL goal <70 -Continue WelChol  Parkinson's disease ([place]) -Continue Sinemet  Near syncope - will check orthostatic blood pressure prior to discharge   Antimicrobials: N/A  DVT prophylaxis: Eliquis Code Status: DNR Family Communication: None present Disposition Plan:  Status is: Inpatient  Remains inpatient appropriate because:Unsafe d/c plan, IV treatments appropriate due to intensity of illness or inability to take PO and Inpatient level of care appropriate due to severity of illness   Dispo: The patient is from: [facility]              Anticipated d/c is to: SNF              Anticipated d/c date is: 1 day              Patient currently is not medically stable to d/c.       Objective: Blood pressure (!) 152/81, pulse 66, temperature (!) 97.2 F (36.2 C), temperature source Oral, resp. rate 18, weight 67.9 kg, SpO2 97 %.  Examination: General appearance: Pleasant elderly man sitting up in chair in no distress with slightly slowed mentation but answers questions appropriately Head: Normocephalic, without obvious abnormality, atraumatic Eyes: EOMI Lungs: clear to auscultation bilaterally Heart: regular rate and rhythm and S1, S2  normal Abdomen: normal findings: bowel sounds normal Extremities: No lower extremity edema Skin: Skin color, texture, turgor normal. No rashes or lesions Neurologic: Grossly normal   Consultants:   Cardiology  Procedures:   None  Data Reviewed: I have personally reviewed following labs and imaging  studies Results for orders placed or performed during the hospital encounter of 08/03/19 (from the past 24 hour(s))  Glucose, capillary     Status: Abnormal   Collection Time: 08/03/19  9:47 PM  Result Value Ref Range   Glucose-Capillary 155 (H) 70 - 99 mg/dL  Basic metabolic panel     Status: Abnormal   Collection Time: 08/04/19  5:24 AM  Result Value Ref Range   Sodium 138 135 - 145 mmol/L   Potassium 3.9 3.5 - 5.1 mmol/L   Chloride 107 98 - 111 mmol/L   CO2 24 22 - 32 mmol/L   Glucose, Bld 97 70 - 99 mg/dL   BUN 29 (H) 8 - 23 mg/dL   Creatinine, Ser 1.39 (H) 0.61 - 1.24 mg/dL   Calcium 8.8 (L) 8.9 - 10.3 mg/dL   GFR calc non Af Amer 46 (L) >60 mL/min   GFR calc Af Amer 53 (L) >60 mL/min   Anion gap 7 5 - 15  CBC     Status: Abnormal   Collection Time: 08/04/19  5:24 AM  Result Value Ref Range   WBC 5.0 4.0 - 10.5 K/uL   RBC 3.78 (L) 4.22 - 5.81 MIL/uL   Hemoglobin 11.6 (L) 13.0 - 17.0 g/dL   HCT 36.1 (L) 39 - 52 %   MCV 95.5 80.0 - 100.0 fL   MCH 30.7 26.0 - 34.0 pg   MCHC 32.1 30.0 - 36.0 g/dL   RDW 12.2 11.5 - 15.5 %   Platelets 179 150 - 400 K/uL   nRBC 0.0 0.0 - 0.2 %  Glucose, capillary     Status: None   Collection Time: 08/04/19  7:26 AM  Result Value Ref Range   Glucose-Capillary 96 70 - 99 mg/dL  Glucose, capillary     Status: Abnormal   Collection Time: 08/04/19 11:22 AM  Result Value Ref Range   Glucose-Capillary 169 (H) 70 - 99 mg/dL  Glucose, capillary     Status: Abnormal   Collection Time: 08/04/19  4:15 PM  Result Value Ref Range   Glucose-Capillary 149 (H) 70 - 99 mg/dL    Recent Results (from the past 240 hour(s))  Urine culture     Status: Abnormal   Collection Time: 08/01/19  2:12 PM   Specimen: Urine, Random  Result Value Ref Range Status   Specimen Description URINE, RANDOM  Final   Special Requests   Final    NONE Performed at [facility], [address], [place], [place] [address]    Culture 30,000 COLONIES/mL STAPHYLOCOCCUS  EPIDERMIDIS (A)  Final   Report Status 08/03/2019 FINAL  Final   Organism ID, Bacteria STAPHYLOCOCCUS EPIDERMIDIS (A)  Final      Susceptibility   Staphylococcus epidermidis - MIC*    CIPROFLOXACIN >=8 RESISTANT Resistant     GENTAMICIN <=0.5 SENSITIVE Sensitive     NITROFURANTOIN <=16 SENSITIVE Sensitive     OXACILLIN >=4 RESISTANT Resistant     TETRACYCLINE <=1 SENSITIVE Sensitive     VANCOMYCIN 2 SENSITIVE Sensitive     TRIMETH/SULFA <=10 SENSITIVE Sensitive     CLINDAMYCIN <=0.25 SENSITIVE Sensitive     RIFAMPIN <=0.5 SENSITIVE Sensitive  Inducible Clindamycin NEGATIVE Sensitive     * 30,000 COLONIES/mL STAPHYLOCOCCUS EPIDERMIDIS  SARS Coronavirus 2 by RT PCR (hospital order, performed in [facility] hospital lab) Nasopharyngeal Nasopharyngeal Swab     Status: None   Collection Time: 08/03/19  9:24 AM   Specimen: Nasopharyngeal Swab  Result Value Ref Range Status   SARS Coronavirus 2 NEGATIVE NEGATIVE Final    Comment: (NOTE) SARS-CoV-2 target nucleic acids are NOT DETECTED.  The SARS-CoV-2 RNA is generally detectable in upper and lower respiratory specimens during the acute phase of infection. The lowest concentration of SARS-CoV-2 viral copies this assay can detect is 250 copies / mL. A negative result does not preclude SARS-CoV-2 infection and should not be used as the sole basis for treatment or other patient management decisions.  A negative result may occur with improper specimen collection / handling, submission of specimen other than nasopharyngeal swab, presence of viral mutation(s) within the areas targeted by this assay, and inadequate number of viral copies (<250 copies / mL). A negative result must be combined with clinical observations, patient history, and epidemiological information.  Fact Sheet for Patients:   StrictlyIdeas.no  Fact Sheet for Healthcare Providers: BankingDealers.co.za  This test is not yet  approved or  cleared by the [place] FDA and has been authorized for detection and/or diagnosis of SARS-CoV-2 by FDA under an Emergency Use Authorization (EUA).  This EUA will remain in effect (meaning this test can be used) for the duration of the COVID-19 declaration under Section 564(b)(1) of the Act, 21 U.S.C. section 360bbb-3(b)(1), unless the authorization is terminated or revoked sooner.  Performed at [facility], [place] [address], [place], [place] [address]      Radiology Studies: MR BRAIN WO CONTRAST  Result Date: 08/03/2019 CLINICAL DATA:  Neuro deficit. EXAM: MRI HEAD WITHOUT CONTRAST TECHNIQUE: Multiplanar, multiecho pulse sequences of the brain and surrounding structures were obtained without intravenous contrast. COMPARISON:  08/03/2019 head CT.  10/14/2014 MRI head. FINDINGS: Brain: Moderate diffuse parenchymal volume loss with ex vacuo dilatation. Remote right corona radiata, right thalamic and right cerebellar infarcts. Scattered and confluent supratentorial white matter T2/FLAIR hyperintense foci. No acute infarct. Tiny remote right frontal and left cerebellar microhemorrhages. No midline shift or extra-axial fluid collection. No mass lesion. Vascular: Normal flow voids. Skull and upper cervical spine: Normal marrow signal. Sinuses/Orbits: Normal orbits. Sequela of bilateral lens replacement. Mild ethmoid sinus mucosal thickening with layering left sphenoid secretions. Trace right mastoid effusion. Other: None. IMPRESSION: No acute intracranial process. Small remote infarcts involving the right corona radiata, right thalamus and right cerebellum. Moderate cerebral atrophy. Mild chronic microvascular ischemic changes. Electronically Signed   By: Primitivo Gauze M.D.   On: 08/03/2019 08:17   CT HEAD CODE STROKE WO CONTRAST  Result Date: 08/03/2019 CLINICAL DATA:  Code stroke.  Left facial droop and slurred speech. EXAM: CT HEAD WITHOUT CONTRAST TECHNIQUE: Contiguous  axial images were obtained from the base of the skull through the vertex without intravenous contrast. COMPARISON:  12/30/2018 FINDINGS: Brain: There is no evidence of acute infarct, intracranial hemorrhage, mass, midline shift, or extra-axial fluid collection. Hypodensities in the cerebral white matter bilaterally are unchanged and nonspecific but compatible with mild chronic small vessel ischemic disease. Small chronic infarcts are noted in the right thalamus and right cerebellum. There are dilated perivascular spaces in the basal ganglia. There is mild cerebral atrophy. Vascular: Calcified atherosclerosis at the skull base. No hyperdense vessel. Skull: No fracture or suspicious osseous lesion. Sinuses/Orbits:  Small volume fluid in the left sphenoid sinus. Clear mastoid air cells. Bilateral cataract extraction. Other: None. ASPECTS [facility] Stroke Program Early CT Score) - Ganglionic level infarction (caudate, lentiform nuclei, internal capsule, insula, M1-M3 cortex): 7 - Supraganglionic infarction (M4-M6 cortex): 3 Total score (0-10 with 10 being normal): 10 IMPRESSION: 1. No evidence of acute intracranial abnormality. 2. ASPECTS is 10. 3. Mild chronic small vessel ischemic disease. These results were communicated to Dr. Leonel Ramsay at 6:03 am on 08/03/2019 by text page via the [facility] messaging system. Electronically Signed   By: Logan Bores M.D.   On: 08/03/2019 06:03   MR BRAIN WO CONTRAST  Final Result    CT HEAD CODE STROKE WO CONTRAST  Final Result       Scheduled Meds: . alfuzosin  10 mg Oral QPM  . amiodarone  200 mg Oral BID  . apixaban  2.5 mg Oral BID  . brimonidine  1 drop Both Eyes BID   And  . timolol  1 drop Both Eyes BID  . brinzolamide  1 drop Right Eye TID  . carbidopa-levodopa  1 tablet Oral TID  . clopidogrel  75 mg Oral Daily  . colesevelam  1,875 mg Oral QPM  . diltiazem  120 mg Oral Daily  . fluticasone  2 spray Each Nare Daily  . insulin aspart  0-9 Units Subcutaneous  TID WC  . latanoprost  1 drop Right Eye QHS  . levothyroxine  150 mcg Oral Daily  . loratadine  5 mg Oral QHS  . melatonin  3 mg Oral QHS  . metoprolol succinate  25 mg Oral Daily  . mirabegron ER  50 mg Oral Daily  . pantoprazole  40 mg Oral Daily  . polyethylene glycol  17 g Oral Daily  . polyvinyl alcohol  1 drop Both Eyes QID  . sertraline  25 mg Oral Daily   PRN Meds: acetaminophen, benzonatate, Muscle Rub, ondansetron (ZOFRAN) IV, traMADol Continuous Infusions:    LOS: 0 days  Time spent: Greater than 50% of the 35 minute visit was spent in counseling/coordination of care for the patient as laid out in the A&P.   Dwyane Dee, MD [facility] 08/04/2019, 5:26 PM   Contact via secure chat.  To contact the attending provider between 7A-7P or the covering provider during after hours 7P-7A, please log into the web site [email or web address] and access using universal [place] password for that web site. If you do not have the password, please call the hospital operator.

## 2019-08-04 NOTE — TOC Initial Note (Signed)
Transition of Care [facility]) - Initial/Assessment Note    Patient Details  Name: [name] MRN: [id number] Date of Birth: [birth year]  Transition of Care [facility]) CM/SW Contact:    Trula Ore, [place] Phone Number: 08/04/2019, 12:43 PM  Clinical Narrative:                  CSW spoke with patients sister [name] who confirmed that plan is for patient to go back to his [facility] when medically ready. CSW called [facility] who confirmed they can accept patient back when he is medically ready.   Plan is for patient to go back to [facility] when medically ready.  CSW will continue to follow. Expected Discharge Plan: Assisted Living Barriers to Discharge: Continued Medical Work up   Patient Goals and CMS Choice     Choice offered to / list presented to : Sibling [name] Sister)  Expected Discharge Plan and Services Expected Discharge Plan: Assisted Living       Living arrangements for the past 2 months: [place]                                      Prior Living Arrangements/Services Living arrangements for the past 2 months: [place] Lives with:: Self, Facility Resident Patient language and need for interpreter reviewed:: Yes Do you feel safe going back to the place where you live?: Yes      Need for Family Participation in Patient Care: Yes (Comment) Care giver support system in place?: Yes (comment)   Criminal Activity/Legal Involvement Pertinent to Current Situation/Hospitalization: No - Comment as needed  Activities of Daily Living      Permission Sought/Granted Permission sought to share information with : Case Manager, Family Supports, Customer service manager                Emotional Assessment       Orientation: : Oriented to Self, Oriented to Place, Oriented to  Time, Oriented to Situation Alcohol / Substance Use: Not Applicable Psych Involvement: No  (comment)  Admission diagnosis:  Slurred speech [R47.81] Facial droop [R29.810] Atrial fibrillation with RVR ([place]) [I48.91] Patient Active Problem List   Diagnosis Date Noted  . Near syncope 08/03/2019  . Symptomatic anemia 11/13/2018  . Parkinson's disease ([place]) 11/13/2018  . AF (paroxysmal atrial fibrillation) ([place])   . Coronary artery disease involving native coronary artery of native heart without angina pectoris   . Hyperlipidemia LDL goal <70   . Atrial fibrillation with RVR ([place]) 08/14/2017  . Urinary tract infection with hematuria   . Non-ST elevation (NSTEMI) myocardial infarction ([place])   . GERD (gastroesophageal reflux disease) 08/04/2017  . Elevated troponin 08/03/2017  . Chest pain 08/03/2017  . CKD (chronic kidney disease), stage III 08/03/2017  . Type II diabetes mellitus with renal manifestations ([place]) 08/03/2017  . Pseudophakia of both eyes 05/31/2017  . Retained ureteral stent 04/22/2016  . Ureteral calculus 01/25/2016  . Benign essential HTN 10/24/2015  . CVA (cerebral vascular accident) ([place]) 10/14/2014  . TIA (transient ischemic attack)   . CME (cystoid macular edema), left 08/10/2013  . Squamous cell carcinoma of eyelid 05/15/2013  . Blepharitis 03/16/2013  . Posterior vitreous detachment of right eye 10/23/2011  . Exudative age-related macular degeneration ([place]) 07/28/2011  . Nonproliferative diabetic retinopathy ([place]) 04/07/2011  . Insomnia, unspecified 05/19/2010  . ATAXIA 03/17/2010  .  DIZZINESS 03/06/2010  . Chronic constipation 02/21/2008  . OSTEOARTHRITIS, KNEE, SEVERE 01/12/2008  . Hypothyroidism (post surgical) 10/15/2006  . Diabetes mellitus, insulin dependent (IDDM), controlled 04/22/2006  . URTICARIA 04/22/2006  . POPLITEAL CYST 04/22/2006  . THYROIDECTOMY, SUBTOTAL, HX OF 04/22/2006  . TOTAL KNEE REPLACEMENT, HX OF 04/22/2006   PCP:  Patient, No Pcp Per Pharmacy:   [facility] [place] [facility] [address] [place] [address] Phone: [phone] Fax: [phone]     Social Determinants of Health (SDOH) Interventions    Readmission Risk Interventions No flowsheet data found.

## 2019-08-04 NOTE — Progress Notes (Signed)
IM requested update of plan - spoke with Bonnita Nasuti cardiology team pharmD who relayed that amiodarone is typically safe in patients with iodinated contrast allergy as it's typically related to a separate molecule. Per d/w Dr. Margaretann Loveless, plan to resume home oral diltiazem this afternoon and start amiodarone at lower dose loading (200mg  BID), observe overnight. F/u rescheduled with atrial fib clinic to 7/30 at 9:30am.  Melina Copa PA-C

## 2019-08-04 NOTE — Assessment & Plan Note (Addendum)
-  Follows outpatient with cardiology, appreciate assistance -Initiated on a Cardizem infusion on admission and responded well.  He converted back to NSR spontaneously -Cardiology has discussed amiodarone with pharmacy regarding initial concern for interaction with his iodine allergy; no concern after discussion -Continue Cardizem and Eliquis -Amiodarone being initiated per cardiology. Discharged on amiodarone 200 mg twice daily then transitioning to amiodarone 200 mg daily on 08/17/2019 -Outpatient follow-up with A. fib clinic and cardiology

## 2019-08-04 NOTE — Assessment & Plan Note (Signed)
-  Continue SSI and POC glucose 

## 2019-08-04 NOTE — Assessment & Plan Note (Signed)
-  Continue WelChol

## 2019-08-04 NOTE — Evaluation (Signed)
Occupational Therapy Evaluation Patient Details Name: [name] MRN: [id number] DOB: November 10, 1931 Today's Date: 08/04/2019    History of Present Illness 86 y.o. male resident of [place], being followed by palliative/hospice with medical history significant hx of CAD with STEMI 07/2017 s/p DES to LCx, paroxysmal atrial fibrillation, type 2 DM, HTN, statin intolerance, CKD stage III (baseline appears 1.3-1.7) CVA, prostate CA, Parkinson's disease, normocytic anemia, hypothyroidism, depression/anxietypresenting with near syncope, slurred speech, L facial droop. Brought to ED as code stroke, also in afib with RVR, had near syncope. MRI negative for acute intracranial process however admitted for Observation of Afib with RVR 08/03/19. Pt presented to ED 7/20 with similar complaints.    Clinical Impression   PTA pt living at ALF and able to complete functional mobility with RW. He reports receiving assist for bathing and IADLs. At time of eval, pt presents with ability to complete sit <> stands at min A with RW and cues for sequencing/safety. He demonstrated the ability to complete functional mobility into the bathroom for toileting. Pt required min A for toilet transfer with BSC over toilet (states this is how he manages at home). Pt completed peri hygiene while standing with min guard for safety. Noted cognitive deficits in orientation, memory, and problem solving (suspect they are baseline). Given current status, recommend pt return to ALF with HHOT to support safety, BADL engagement, and PLOF. OT will continue to follow per POC listed below.    Follow Up Recommendations  Home health OT;Supervision/Assistance - 24 hour;Other (comment) (back to ALF)    Equipment Recommendations  None recommended by OT    Recommendations for Other Services       Precautions / Restrictions Precautions Precautions: Fall Restrictions Weight Bearing Restrictions: No      Mobility Bed Mobility Overal bed  mobility: Needs Assistance Bed Mobility: Supine to Sit     Supine to sit: Min guard;HOB elevated     General bed mobility comments: up in chair  Transfers Overall transfer level: Needs assistance Equipment used: Rolling walker (2 wheeled) Transfers: Sit to/from Stand Sit to Stand: Min assist         General transfer comment: min A for safety and steadying with power up from surfaces    Balance Overall balance assessment: Needs assistance Sitting-balance support: No upper extremity supported;Feet supported Sitting balance-Leahy Scale: Fair     Standing balance support: Bilateral upper extremity supported;During functional activity Standing balance-Leahy Scale: Poor Standing balance comment: requires at least single UE support for balance                           ADL either performed or assessed with clinical judgement   ADL Overall ADL's : Needs assistance/impaired Eating/Feeding: Set up;Sitting   Grooming: Min guard;Cueing for sequencing;Standing Grooming Details (indicate cue type and reason): standing at sink with and without UE support. Cues for safe sequencing Upper Body Bathing: Minimal assistance;Sitting   Lower Body Bathing: Minimal assistance;Sit to/from stand;Sitting/lateral leans;Cueing for sequencing   Upper Body Dressing : Min guard;Sitting   Lower Body Dressing: Moderate assistance;Sit to/from stand;Sitting/lateral leans;Cueing for safety;Cueing for sequencing   Toilet Transfer: Minimal assistance;Ambulation;Regular Toilet;BSC;RW Toilet Transfer Details (indicate cue type and reason): min A for safe mobility into bathroom with BSC over toilet (pt requests this bc it is what he does at home). Assist for safe descent Toileting- Clothing Manipulation and Hygiene: Sit to/from stand;Min guard Toileting - Clothing Manipulation Details (indicate cue type  and reason): sit <> stand for anterior peri care. Pt had one hand on grab bar and one to perform  hygiene     Functional mobility during ADLs: Minimal assistance;Min guard;Rolling walker;Cueing for safety;Cueing for sequencing       Vision         Perception     Praxis      Pertinent Vitals/Pain Pain Assessment: No/denies pain     Hand Dominance Right   Extremity/Trunk Assessment Upper Extremity Assessment Upper Extremity Assessment: Generalized weakness   Lower Extremity Assessment Lower Extremity Assessment: Defer to PT evaluation RLE Deficits / Details: TKA, decreased knee ROM, strength assessed durning mobility 3+/5, pt with complaints of L knee "not working right"  LLE Deficits / Details: TKA, decreased knee ROM, strength assessed durning mobility 3+/5   Cervical / Trunk Assessment Cervical / Trunk Assessment: Kyphotic   Communication Communication Communication: HOH   Cognition Arousal/Alertness: Awake/alert Behavior During Therapy: WFL for tasks assessed/performed Overall Cognitive Status: No family/caregiver present to determine baseline cognitive functioning Area of Impairment: Orientation;Attention;Following commands;Safety/judgement;Awareness;Problem solving;Memory                 Orientation Level: Disoriented to;Time Current Attention Level: Selective Memory: Decreased short-term memory Following Commands: Follows one step commands with increased time Safety/Judgement: Decreased awareness of safety;Decreased awareness of deficits Awareness: Emergent Problem Solving: Slow processing;Decreased initiation;Difficulty sequencing;Requires verbal cues;Requires tactile cues General Comments: limited conversation, difficulty placing events in time, when he had knee surgery, or was running on the treadmill. needs safety cues throughout BADL tasks   General Comments  VSS    Exercises     Shoulder Instructions      Home Living Family/patient expects to be discharged to:: Assisted living                             Home Equipment:  Walker - 2 wheels;Bedside commode;Grab bars - tub/shower;Shower seat;Other (comment);Electric scooter   Additional Comments: reports meals and bathing provided      Prior Functioning/Environment Level of Independence: Needs assistance  Gait / Transfers Assistance Needed: RW for ambulation in room, electric scooter to get to dining room  ADL's / Homemaking Assistance Needed: report assist for bathing but does his own dressing and making his bed             OT Problem List: Decreased strength;Decreased activity tolerance;Impaired balance (sitting and/or standing);Decreased cognition;Decreased safety awareness;Decreased knowledge of use of DME or AE      OT Treatment/Interventions: Self-care/ADL training;Therapeutic exercise;DME and/or AE instruction;Energy conservation;Therapeutic activities;Cognitive remediation/compensation;Patient/family education;Balance training    OT Goals(Current goals can be found in the care plan section) Acute Rehab OT Goals Patient Stated Goal: go home OT Goal Formulation: With patient Time For Goal Achievement: 08/18/19 Potential to Achieve Goals: Good  OT Frequency: Min 2X/week   Barriers to D/C:            Co-evaluation              AM-PAC OT "6 Clicks" Daily Activity     Outcome Measure Help from another person eating meals?: A Little Help from another person taking care of personal grooming?: A Little Help from another person toileting, which includes using toliet, bedpan, or urinal?: A Little Help from another person bathing (including washing, rinsing, drying)?: A Lot Help from another person to put on and taking off regular upper body clothing?: A Little Help from another person to put on  and taking off regular lower body clothing?: A Lot 6 Click Score: 16   End of Session Equipment Utilized During Treatment: Gait belt;Rolling walker Nurse Communication: Mobility status  Activity Tolerance: Patient tolerated treatment well Patient  left: in chair;with call bell/phone within reach;with chair alarm set  OT Visit Diagnosis: Unsteadiness on feet (R26.81);Other abnormalities of gait and mobility (R26.89);Muscle weakness (generalized) (M62.81);Other symptoms and signs involving cognitive function                Time: 1249-1306 OT Time Calculation (min): 17 min Charges:  OT General Charges $OT Visit: 1 Visit OT Evaluation $OT Eval Moderate Complexity: [place], MSOT, OTR/L [facility] Office Number: [phone] Pager: [phone]  Zenovia Jarred 08/04/2019, 1:34 PM

## 2019-08-04 NOTE — Assessment & Plan Note (Signed)
Continue Synthroid °

## 2019-08-04 NOTE — TOC Progression Note (Signed)
Transition of Care [facility]) - Progression Note    Patient Details  Name: [name] MRN: [id number] Date of Birth: [birth year]  Transition of [facility]) CM/SW [place], [place] Phone Number: 08/04/2019, 2:36 PM  Clinical Narrative:     CSW spoke with patient at bedside. Patient declined [place]. CSW called ALF to see if patient was getting [place] at ALF. ALF said he has not had home health services with them. ALF said they have offered Home Health services in the past and he has declined services with them.   Plan is for patient to return to [name] [facility] when medically ready.  CSW will continue to follow.  Expected Discharge Plan: Assisted Living Barriers to Discharge: Continued Medical Work up  Expected Discharge Plan and Services Expected Discharge Plan: Assisted Living       Living arrangements for the past 2 months: Assisted Living Facility                                       Social Determinants of Health (SDOH) Interventions    Readmission Risk Interventions No flowsheet data found.

## 2019-08-04 NOTE — Hospital Course (Addendum)
Mr. Pryde is an 86 y o CM with PMH afib, CVA, prostate cancer, hypothyroidism, hypertension, hyperlipidemia, type 2 diabetes, CKD 3 who presented to the hospital with a near syncopal event, slurred speech, and left facial droop.  He was noted to be fine the day prior to admission and overnight however upon waking up around 4 AM the morning of admission he was noted to be weak and fatigued.  On work-up he was found to be in A. fib with RVR which he converts in and out of intermittently.  He follows closely outpatient with cardiology.  He was started on a Cardizem infusion on admission due to RVR.  He converted back to normal sinus rhythm spontaneously.  Given his increased ability for converting to RVR, he was started on amiodarone and transitioned back to oral Cardizem.  His iodine allergy was not considered to be significant in the setting of initiating amiodarone after discussion with the cardiology pharmacist.  He was continued on amiodarone 200 mg twice daily at discharge and on 08/17/2019 he will transition to amiodarone 200 mg daily. He should follow up with the A. fib clinic and cardiology at discharge as well. He had converted back to NSR prior to discharge with no further symptoms and was considered stable for discharging back to his nursing facility.

## 2019-08-04 NOTE — Assessment & Plan Note (Signed)
Continue Sinemet ?

## 2019-08-04 NOTE — Assessment & Plan Note (Addendum)
-  Blood pressure remained stable and well controlled

## 2019-08-04 NOTE — Progress Notes (Signed)
Progress Note  Patient Name: [name] Date of Encounter: 08/04/2019  Primary Cardiologist: Fransico Him, MD   Subjective   In SR. Alert but not very interactive.   Inpatient Medications    Scheduled Meds: . alfuzosin  10 mg Oral QPM  . amiodarone  200 mg Oral BID  . apixaban  2.5 mg Oral BID  . brimonidine  1 drop Both Eyes BID   And  . timolol  1 drop Both Eyes BID  . brinzolamide  1 drop Right Eye TID  . carbidopa-levodopa  1 tablet Oral TID  . clopidogrel  75 mg Oral Daily  . colesevelam  1,875 mg Oral QPM  . diltiazem  120 mg Oral Daily  . fluticasone  2 spray Each Nare Daily  . insulin aspart  0-9 Units Subcutaneous TID WC  . latanoprost  1 drop Right Eye QHS  . levothyroxine  150 mcg Oral Daily  . loratadine  5 mg Oral QHS  . melatonin  3 mg Oral QHS  . metoprolol succinate  25 mg Oral Daily  . mirabegron ER  50 mg Oral Daily  . pantoprazole  40 mg Oral Daily  . polyethylene glycol  17 g Oral Daily  . polyvinyl alcohol  1 drop Both Eyes QID  . sertraline  25 mg Oral Daily   Continuous Infusions:  PRN Meds: acetaminophen, benzonatate, Muscle Rub, ondansetron (ZOFRAN) IV, traMADol   Vital Signs    Vitals:   08/04/19 1124 08/04/19 1140 08/04/19 1614 08/04/19 1619  BP: (!) 86/61 102/65 (!) 149/82 (!) 152/81  Pulse: 66 65  66  Resp: 18   18  Temp: (!) 97.3 F (36.3 C)   (!) 97.2 F (36.2 C)  TempSrc: Oral   Oral  SpO2: 98% 97%  97%  Weight:        Intake/Output Summary (Last 24 hours) at 08/04/2019 1801 Last data filed at 08/04/2019 6629 Gross per 24 hour  Intake 100 ml  Output 400 ml  Net -300 ml   Filed Weights   08/03/19 0547 08/04/19 0351  Weight: 71 kg 67.9 kg    Telemetry    SR - Personally Reviewed  ECG    No new - Personally Reviewed  Physical Exam   GEN: No acute distress.   Neck: No JVD Cardiac: regular rhythm, normal rate, no murmurs, rubs, or gallops.  Respiratory: Clear to auscultation bilaterally. GI: Soft,  nontender, non-distended  MS: No edema; No deformity. Neuro:  Nonfocal, alert but very HOH despite hearing aids and difficulty assessing orientation Psych: appears distant, does not answer questions appropriately  Labs    Chemistry Recent Labs  Lab 08/01/19 0839 08/01/19 0839 08/03/19 0543 08/03/19 0546 08/04/19 0524  NA 140   < > 139 142 138  K 4.3   < > 3.8 3.7 3.9  CL 109   < > 109 107 107  CO2 23  --  20*  --  24  GLUCOSE 118*   < > 132* 126* 97  BUN 30*   < > 36* 34* 29*  CREATININE 1.68*   < > 1.65* 1.70* 1.39*  CALCIUM 8.4*  --  8.6*  --  8.8*  PROT 5.8*  --  5.8*  --   --   ALBUMIN 3.3*  --  3.3*  --   --   AST 8*  --  7*  --   --   ALT <5  --  7  --   --  ALKPHOS 70  --  73  --   --   BILITOT 0.8  --  0.8  --   --   GFRNONAA 36*  --  37*  --  46*  GFRAA 42*  --  43*  --  53*  ANIONGAP 8  --  10  --  7   < > = values in this interval not displayed.     Hematology Recent Labs  Lab 08/01/19 2831 08/01/19 0839 08/03/19 0543 08/03/19 0546 08/04/19 0524  WBC 6.5  --  5.4  --  5.0  RBC 3.82*  --  3.89*  --  3.78*  HGB 11.9*   < > 12.1* 12.2* 11.6*  HCT 37.6*   < > 37.8* 36.0* 36.1*  MCV 98.4  --  97.2  --  95.5  MCH 31.2  --  31.1  --  30.7  MCHC 31.6  --  32.0  --  32.1  RDW 12.2  --  12.3  --  12.2  PLT 186  --  176  --  179   < > = values in this interval not displayed.    Cardiac EnzymesNo results for input(s): TROPONINI in the last 168 hours. No results for input(s): TROPIPOC in the last 168 hours.   BNPNo results for input(s): BNP, PROBNP in the last 168 hours.   DDimer No results for input(s): DDIMER in the last 168 hours.   Radiology    MR BRAIN WO CONTRAST  Result Date: 08/03/2019 CLINICAL DATA:  Neuro deficit. EXAM: MRI HEAD WITHOUT CONTRAST TECHNIQUE: Multiplanar, multiecho pulse sequences of the brain and surrounding structures were obtained without intravenous contrast. COMPARISON:  08/03/2019 head CT.  10/14/2014 MRI head. FINDINGS:  Brain: Moderate diffuse parenchymal volume loss with ex vacuo dilatation. Remote right corona radiata, right thalamic and right cerebellar infarcts. Scattered and confluent supratentorial white matter T2/FLAIR hyperintense foci. No acute infarct. Tiny remote right frontal and left cerebellar microhemorrhages. No midline shift or extra-axial fluid collection. No mass lesion. Vascular: Normal flow voids. Skull and upper cervical spine: Normal marrow signal. Sinuses/Orbits: Normal orbits. Sequela of bilateral lens replacement. Mild ethmoid sinus mucosal thickening with layering left sphenoid secretions. Trace right mastoid effusion. Other: None. IMPRESSION: No acute intracranial process. Small remote infarcts involving the right corona radiata, right thalamus and right cerebellum. Moderate cerebral atrophy. Mild chronic microvascular ischemic changes. Electronically Signed   By: Primitivo Gauze M.D.   On: 08/03/2019 08:17   CT HEAD CODE STROKE WO CONTRAST  Result Date: 08/03/2019 CLINICAL DATA:  Code stroke.  Left facial droop and slurred speech. EXAM: CT HEAD WITHOUT CONTRAST TECHNIQUE: Contiguous axial images were obtained from the base of the skull through the vertex without intravenous contrast. COMPARISON:  12/30/2018 FINDINGS: Brain: There is no evidence of acute infarct, intracranial hemorrhage, mass, midline shift, or extra-axial fluid collection. Hypodensities in the cerebral white matter bilaterally are unchanged and nonspecific but compatible with mild chronic small vessel ischemic disease. Small chronic infarcts are noted in the right thalamus and right cerebellum. There are dilated perivascular spaces in the basal ganglia. There is mild cerebral atrophy. Vascular: Calcified atherosclerosis at the skull base. No hyperdense vessel. Skull: No fracture or suspicious osseous lesion. Sinuses/Orbits: Small volume fluid in the left sphenoid sinus. Clear mastoid air cells. Bilateral cataract extraction.  Other: None. ASPECTS [facility] Stroke Program Early CT Score) - Ganglionic level infarction (caudate, lentiform nuclei, internal capsule, insula, M1-M3 cortex): 7 - Supraganglionic infarction (M4-M6 cortex): 3 Total  score (0-10 with 10 being normal): 10 IMPRESSION: 1. No evidence of acute intracranial abnormality. 2. ASPECTS is 10. 3. Mild chronic small vessel ischemic disease. These results were communicated to Dr. Leonel Ramsay at 6:03 am on 08/03/2019 by text page via the [facility] messaging system. Electronically Signed   By: Logan Bores M.D.   On: 08/03/2019 06:03    Cardiac Studies     Patient Profile       Assessment & Plan   Principal Problem:   Atrial fibrillation with RVR (HCC) Active Problems:   Hypothyroidism (post surgical)   Benign essential HTN   CKD (chronic kidney disease), stage III   Type II diabetes mellitus with renal manifestations (HCC)   Hyperlipidemia LDL goal <70   Parkinson's disease ([place])   Near syncope   Afib - recurrent episodes of afib. May benefit from amiodarone. Reviewed in setting of contrast intolerance, safe from CV pharmacist perspective. Will start amiodarone 200 mg BID with plan for 5 gram load, then transition to 200 mg daily on 08/17/19. Can review this with afib clinic for any changes in therapy needed. Resume home diltiazem and discontinue IV dilt. Continue eliquis and plavix, discuss duration of antiplatelet therapy at cardiology visit.   Can likely dc home tomorrow from CV perspective if feeling well.     For questions or updates, please contact [place] Please consult [email or web address] for contact info under        Signed, Elouise Munroe, MD  08/04/2019, 6:01 PM

## 2019-08-04 NOTE — Assessment & Plan Note (Signed)
Continue current regimen

## 2019-08-04 NOTE — NC FL2 (Addendum)
[place] LEVEL OF CARE SCREENING TOOL     IDENTIFICATION  Patient Name: [name] Birthdate: 06-Dec-1931 Sex: male Admission Date (Current Location): 08/03/2019  [facility] and [place] Number:  Herbalist and Address:  [facility] [place], [place] [address] [place], [place] [address]      Provider Number: [id number]  Attending Physician Name and Address:  Dwyane Dee, MD  Relative Name and Phone Number:  [name] [phone]    Current Level of Care: Hospital Recommended Level of Care: [place] Prior Approval Number:    Date Approved/Denied:   PASRR Number: [id number]  Discharge Plan: Other (Comment) (ALF [facility])    Current Diagnoses: Patient Active Problem List   Diagnosis Date Noted  . Near syncope 08/03/2019  . Symptomatic anemia 11/13/2018  . Parkinson's disease ([place]) 11/13/2018  . AF (paroxysmal atrial fibrillation) ([place])   . Coronary artery disease involving native coronary artery of native heart without angina pectoris   . Hyperlipidemia LDL goal <70   . Atrial fibrillation with RVR ([place]) 08/14/2017  . Urinary tract infection with hematuria   . Non-ST elevation (NSTEMI) myocardial infarction ([place])   . GERD (gastroesophageal reflux disease) 08/04/2017  . Elevated troponin 08/03/2017  . Chest pain 08/03/2017  . CKD (chronic kidney disease), stage III 08/03/2017  . Type II diabetes mellitus with renal manifestations ([place]) 08/03/2017  . Pseudophakia of both eyes 05/31/2017  . Retained ureteral stent 04/22/2016  . Ureteral calculus 01/25/2016  . Benign essential HTN 10/24/2015  . CVA (cerebral vascular accident) ([place]) 10/14/2014  . TIA (transient ischemic attack)   . CME (cystoid macular edema), left 08/10/2013  . Squamous cell carcinoma of eyelid 05/15/2013  . Blepharitis 03/16/2013  . Posterior vitreous detachment of right eye 10/23/2011  . Exudative age-related macular degeneration ([place])  07/28/2011  . Nonproliferative diabetic retinopathy ([place]) 04/07/2011  . Insomnia, unspecified 05/19/2010  . ATAXIA 03/17/2010  . DIZZINESS 03/06/2010  . Chronic constipation 02/21/2008  . OSTEOARTHRITIS, KNEE, SEVERE 01/12/2008  . Hypothyroidism (post surgical) 10/15/2006  . Diabetes mellitus, insulin dependent (IDDM), controlled 04/22/2006  . URTICARIA 04/22/2006  . POPLITEAL CYST 04/22/2006  . THYROIDECTOMY, SUBTOTAL, HX OF 04/22/2006  . TOTAL KNEE REPLACEMENT, HX OF 04/22/2006    Orientation RESPIRATION BLADDER Height & Weight     Self, Time, Situation, Place  Normal Continent Weight: 149 lb 11.1 oz (67.9 kg) Height:     BEHAVIORAL SYMPTOMS/MOOD NEUROLOGICAL BOWEL NUTRITION STATUS      Continent (WDL) Diet - low sodium heart healthy       AMBULATORY STATUS COMMUNICATION OF NEEDS Skin     Verbally Other (Comment) (Appropriate for ethnicity,Dry, Non-tenting)                       Personal Care Assistance Level of Assistance  Bathing, Feeding, Dressing Bathing Assistance: Limited assistance Feeding assistance: Independent Dressing Assistance: Limited assistance     Functional Limitations Info  Sight, Hearing, Speech Sight Info: Impaired Hearing Info: Impaired Speech Info: Adequate    SPECIAL CARE FACTORS FREQUENCY  PT (By licensed PT), OT (By licensed OT)     PT Frequency: 3x min weekly OT Frequency: 3x min weekly            Contractures Contractures Info: Not present    Additional Factors Info  Code Status Code Status Info: DNR Allergies Info: Colchicine,Hibiclens,Atorvastatin,Ceftin,Celebrex,Codeine,Erythromycin,Ezetimibe,Iodinated Diagnostic Agents,Oxycodone-acetaminophen,Rosuvastatin,Simvastatin,Cefuroxime,Celebrex,Codeine,Crestor,Erythromycin,Lipitor,Nitroglycerin,Percocet,Protonix,Tetanus,CefpodoximeCefuroxime Axetil,Nitroglycerin,Vantin  Discharge Medications:  TAKE these medications   alfuzosin 10 MG 24 hr tablet Commonly known  as: UROXATRAL Take 1 tablet (10 mg total) by mouth at bedtime. What changed: when to take this   Alogliptin Benzoate 12.5 MG Tabs Take 12.5 mg by mouth daily.   amiodarone 200 MG tablet Commonly known as: PACERONE Take 1 tablet (200 mg total) by mouth 2 (two) times daily for 11 days.   amiodarone 200 MG tablet Commonly known as: Pacerone Take 1 tablet (200 mg total) by mouth daily. Start taking on: August 17, 2019   apixaban 2.5 MG Tabs tablet Commonly known as: ELIQUIS Take 2.5 mg by mouth 2 (two) times daily.   ARTIFICIAL TEARS OP Place 1 drop into both eyes 4 (four) times daily.   benzonatate 100 MG capsule Commonly known as: TESSALON Take 100 mg by mouth every 8 (eight) hours as needed for cough.   brinzolamide 1 % ophthalmic suspension Commonly known as: AZOPT Place 1 drop into the right eye 3 (three) times daily.   carbidopa-levodopa 25-100 MG tablet Commonly known as: SINEMET IR Take 1 tablet by mouth 3 (three) times daily.   clopidogrel 75 MG tablet Commonly known as: PLAVIX Take 75 mg by mouth daily.   colesevelam 625 MG tablet Commonly known as: WELCHOL Take 1,875 mg by mouth every evening.   Combigan 0.2-0.5 % ophthalmic solution Generic drug: brimonidine-timolol Place 1 drop into both eyes 2 (two) times daily.   diltiazem 120 MG 24 hr capsule Commonly known as: CARDIZEM CD Take 1 capsule (120 mg total) by mouth daily for 30 days.   docusate sodium 100 MG capsule Commonly known as: COLACE Take 100 mg by mouth 2 (two) times daily.   ferrous sulfate 325 (65 FE) MG EC tablet Take 1 tablet (325 mg total) by mouth 2 (two) times daily with a meal.   fluticasone 50 MCG/ACT nasal spray Commonly known as: FLONASE Place 2 sprays into both nostrils daily.   latanoprost 0.005 % ophthalmic solution Commonly known as: XALATAN Place 1 drop into the right eye at bedtime.   levocetirizine 5 MG tablet Commonly known as: XYZAL Take 2.5 mg by  mouth at bedtime.   levothyroxine 150 MCG tablet Commonly known as: SYNTHROID Take 150 mcg by mouth daily.   Melatonin 3 MG Subl Place 3 mg under the tongue at bedtime.   metoprolol succinate 25 MG 24 hr tablet Commonly known as: TOPROL-XL Take 1 tablet (25 mg total) by mouth daily. Start taking on: August 06, 2019 What changed: how much to take   mirabegron ER 50 MG Tb24 tablet Commonly known as: MYRBETRIQ Take 1 tablet (50 mg total) by mouth daily.   pantoprazole 40 MG tablet Commonly known as: PROTONIX Take 40 mg by mouth daily.   polyethylene glycol 17 g packet Commonly known as: MIRALAX / GLYCOLAX Take 17 g by mouth daily. MIX AND DRINK   PreserVision/Lutein Caps Take 1 capsule by mouth 2 (two) times daily.   sertraline 25 MG tablet Commonly known as: ZOLOFT Take 25 mg by mouth daily.   traMADol 50 MG tablet Commonly known as: Ultram Take 1 tablet (50 mg total) by mouth every 6 (six) hours as needed. What changed: reasons to take this   Uro-MP 118 MG Caps Take 1 capsule by mouth 3 (three) times daily.   Vitamin B-12 2500 MCG Subl Place 1 tablet under the tongue daily.   Vitamin D-3 125 MCG (5000 UT) Tabs Take 5,000 Units  by mouth daily.           Relevant Imaging Results:  Relevant Lab Results:   Additional Information [place] Workman, LCSWA

## 2019-08-04 NOTE — Evaluation (Signed)
Physical Therapy Evaluation Patient Details Name: [name] MRN: [id number] DOB: 1931-12-31 Today's Date: 08/04/2019   History of Present Illness  86 y.o. male resident of [facility], being followed by palliative/hospice with medical history significant hx of CAD with STEMI 07/2017 s/p DES to LCx, paroxysmal atrial fibrillation, type 2 DM, HTN, statin intolerance, CKD stage III (baseline appears 1.3-1.7) CVA, prostate CA, Parkinson's disease, normocytic anemia, hypothyroidism, depression/anxietypresenting with near syncope, slurred speech, L facial droop. Brought to ED as code stroke, also in afib with RVR, had near syncope. MRI negative for acute intracranial process however admitted for Observation of Afib with RVR 08/03/19. Pt presented to ED 7/20 with similar complaints.   Clinical Impression  PTA pt resident of ALF where pt utilized RW for ambulation in his room and electric scooter/wheelchair to get to dining room. Pt reports independence with dressing and assist with bathing. Pt cardiopulmonary response to positional change and mobility appropriate at this time. Pt is currently limited in safe mobility by decreased safety awareness in presence of decreased LE ROM, festinating gait, decreased balance and endurance. Pt is currently min guard for bed mobility, modA for transfers and min A for ambulation with RW. PT recommends return to ALF and HHPT. PT will continue to follow acutely.  Orthostatic BPs                                                            BP                      HR Supine 115/61 71  Sitting 118/66 78  Standing 100/58 86  Sitting after ambulation  120/71 99       Follow Up Recommendations Home health PT;Supervision/Assistance - 24 hour;Supervision for mobility/OOB    Equipment Recommendations  None recommended by PT       Precautions / Restrictions Precautions Precautions: Fall      Mobility  Bed Mobility Overal bed mobility: Needs Assistance Bed  Mobility: Supine to Sit     Supine to sit: Min guard;HOB elevated     General bed mobility comments: increased time and effort with HoB elevated   Transfers Overall transfer level: Needs assistance Equipment used: Rolling walker (2 wheeled) Transfers: Sit to/from Stand Sit to Stand: Min assist;Mod assist         General transfer comment: min A for power up, modA for brining CoG over BoS as initially utilizing side of bed for posterior LE support   Ambulation/Gait Ambulation/Gait assistance: Min assist Gait Distance (Feet): 20 Feet Assistive device: Rolling walker (2 wheeled) Gait Pattern/deviations: Decreased step length - right;Decreased step length - left;Decreased stride length;Antalgic;Trunk flexed;Step-through pattern;Festinating Gait velocity: slowed Gait velocity interpretation: <1.31 ft/sec, indicative of household ambulator General Gait Details: min A for managment of RW, despite maximal cuing difficulty with keeping proximity to RW, festinating gait that improves in open space,          Balance Overall balance assessment: Needs assistance Sitting-balance support: No upper extremity supported;Feet supported Sitting balance-Leahy Scale: Fair     Standing balance support: Bilateral upper extremity supported;During functional activity Standing balance-Leahy Scale: Poor Standing balance comment: requires at least single UE support for balance  Pertinent Vitals/Pain Pain Assessment: No/denies pain    Home Living Family/patient expects to be discharged to:: Assisted living               Home Equipment: Walker - 2 wheels;Bedside commode;Grab bars - tub/shower;Shower seat;Other (comment);Electric scooter (adjustable bed) Additional Comments: reports meals and bathing provided    Prior Function Level of Independence: Needs assistance   Gait / Transfers Assistance Needed: RW for ambulation in room, electric scooter to  get to dining room   ADL's / Homemaking Assistance Needed: report assist for bathing but does his own dressing and making his bed         Hand Dominance   Dominant Hand: Right    Extremity/Trunk Assessment   Upper Extremity Assessment Upper Extremity Assessment: Defer to OT evaluation    Lower Extremity Assessment Lower Extremity Assessment: RLE deficits/detail;LLE deficits/detail RLE Deficits / Details: TKA, decreased knee ROM, strength assessed durning mobility 3+/5, pt with complaints of L knee "not working right"  LLE Deficits / Details: TKA, decreased knee ROM, strength assessed durning mobility 3+/5    Cervical / Trunk Assessment Cervical / Trunk Assessment: Kyphotic  Communication   Communication: HOH  Cognition Arousal/Alertness: Awake/alert Behavior During Therapy: WFL for tasks assessed/performed Overall Cognitive Status: No family/caregiver present to determine baseline cognitive functioning Area of Impairment: Orientation;Attention;Following commands;Safety/judgement;Awareness;Problem solving;Memory                 Orientation Level: Disoriented to;Time (not sure of day, knows its summer, no year) Current Attention Level: Selective Memory: Decreased short-term memory (can't remember particulars of his fall ) Following Commands: Follows one step commands with increased time Safety/Judgement: Decreased awareness of safety;Decreased awareness of deficits Awareness: Emergent Problem Solving: Slow processing;Decreased initiation;Difficulty sequencing;Requires verbal cues;Requires tactile cues General Comments: limited conversation, difficulty placing events in time, when he had knee surgery, or was running on the treadmill       General Comments General comments (skin integrity, edema, etc.): HR 71-99bpm, negative for orthostatic BP, SaO2 >90%O2        Assessment/Plan    PT Assessment Patient needs continued PT services  PT Problem List Decreased  activity tolerance;Decreased balance;Decreased mobility;Decreased knowledge of use of DME;Decreased safety awareness;Decreased knowledge of precautions;Cardiopulmonary status limiting activity;Decreased strength       PT Treatment Interventions DME instruction;Gait training;Functional mobility training;Therapeutic activities;Therapeutic exercise;Balance training;Patient/family education    PT Goals (Current goals can be found in the Care Plan section)  Acute Rehab PT Goals Patient Stated Goal: unable to report PT Goal Formulation: With patient Time For Goal Achievement: 07/27/19 Potential to Achieve Goals: Good    Frequency Min 3X/week    AM-PAC PT "6 Clicks" Mobility  Outcome Measure Help needed turning from your back to your side while in a flat bed without using bedrails?: None Help needed moving from lying on your back to sitting on the side of a flat bed without using bedrails?: A Little Help needed moving to and from a bed to a chair (including a wheelchair)?: A Little Help needed standing up from a chair using your arms (e.g., wheelchair or bedside chair)?: A Little Help needed to walk in hospital room?: A Little Help needed climbing 3-5 steps with a railing? : A Lot 6 Click Score: 18    End of Session Equipment Utilized During Treatment: Gait belt Activity Tolerance: Patient tolerated treatment well Patient left: with call bell/phone within reach;in chair;with chair alarm set Nurse Communication: Mobility status PT Visit Diagnosis: Unsteadiness on feet (  R26.81);Muscle weakness (generalized) (M62.81);Difficulty in walking, not elsewhere classified (R26.2);Repeated falls (R29.6);Other abnormalities of gait and mobility (R26.89)    Time: 3832-9191 PT Time Calculation (min) (ACUTE ONLY): 33 min   Charges:   PT Evaluation $PT Eval Moderate Complexity: 1 Mod PT Treatments $Therapeutic Activity: 8-22 mins        Yerachmiel Spinney B. Migdalia Dk PT, DPT Acute Rehabilitation  Services Pager [phone] Office [phone]   [place] 08/04/2019, 10:47 AM

## 2019-08-05 LAB — CBC WITH DIFFERENTIAL/PLATELET
Abs Immature Granulocytes: 0.01 10*3/uL (ref 0.00–0.07)
Basophils Absolute: 0.1 10*3/uL (ref 0.0–0.1)
Basophils Relative: 1 %
Eosinophils Absolute: 0.3 10*3/uL (ref 0.0–0.5)
Eosinophils Relative: 6 %
HCT: 38.3 % — ABNORMAL LOW (ref 39.0–52.0)
Hemoglobin: 12.4 g/dL — ABNORMAL LOW (ref 13.0–17.0)
Immature Granulocytes: 0 %
Lymphocytes Relative: 29 %
Lymphs Abs: 1.4 10*3/uL (ref 0.7–4.0)
MCH: 30.4 pg (ref 26.0–34.0)
MCHC: 32.4 g/dL (ref 30.0–36.0)
MCV: 93.9 fL (ref 80.0–100.0)
Monocytes Absolute: 0.5 10*3/uL (ref 0.1–1.0)
Monocytes Relative: 11 %
Neutro Abs: 2.6 10*3/uL (ref 1.7–7.7)
Neutrophils Relative %: 53 %
Platelets: 173 10*3/uL (ref 150–400)
RBC: 4.08 MIL/uL — ABNORMAL LOW (ref 4.22–5.81)
RDW: 11.9 % (ref 11.5–15.5)
WBC: 4.9 10*3/uL (ref 4.0–10.5)
nRBC: 0 % (ref 0.0–0.2)

## 2019-08-05 LAB — BASIC METABOLIC PANEL
Anion gap: 12 (ref 5–15)
BUN: 28 mg/dL — ABNORMAL HIGH (ref 8–23)
CO2: 21 mmol/L — ABNORMAL LOW (ref 22–32)
Calcium: 9.1 mg/dL (ref 8.9–10.3)
Chloride: 105 mmol/L (ref 98–111)
Creatinine, Ser: 1.34 mg/dL — ABNORMAL HIGH (ref 0.61–1.24)
GFR calc Af Amer: 56 mL/min — ABNORMAL LOW (ref >60)
GFR calc non Af Amer: 48 mL/min — ABNORMAL LOW (ref >60)
Glucose, Bld: 78 mg/dL (ref 70–99)
Potassium: 4.1 mmol/L (ref 3.5–5.1)
Sodium: 138 mmol/L (ref 135–145)

## 2019-08-05 LAB — GLUCOSE, CAPILLARY
Glucose-Capillary: 79 mg/dL (ref 70–99)
Glucose-Capillary: 83 mg/dL (ref 70–99)

## 2019-08-05 LAB — MAGNESIUM: Magnesium: 2 mg/dL (ref 1.7–2.4)

## 2019-08-05 MED ORDER — AMIODARONE HCL 200 MG PO TABS: 200.0000 mg | ORAL_TABLET | Freq: Two times a day (BID) | ORAL | 0 refills | Status: DC

## 2019-08-05 MED ORDER — AMIODARONE HCL 200 MG PO TABS: 200.0000 mg | ORAL_TABLET | Freq: Every day | ORAL | Status: AC

## 2019-08-05 MED ORDER — METOPROLOL SUCCINATE ER 25 MG PO TB24: 25.0000 mg | ORAL_TABLET | Freq: Every day | ORAL | Status: AC

## 2019-08-05 NOTE — TOC Transition Note (Signed)
Transition of Care [facility]) - CM/SW Discharge Note   Patient Details  Name: [name] MRN: [id number] Date of Birth: Dec 08, 1931  Transition of Care [facility]) CM/SW Contact:  Bary Castilla, LCSW Phone Number: [phone] 08/05/2019, 2:30 PM   Clinical Narrative:     Patient will DC to:?[facility] Anticipated DC date:?08/05/19 Family notified:?Yes, Comptroller by: Corey Harold   Per MD patient ready for DC to [facility]. RN, patient, patient's family, and facility notified of DC. Discharge Summary sent to facility. RN given number for report [phone] Helene Kelp. DC packet on chart. Ambulance transport requested for patient.   CSW signing off.   [place], [place] [phone]   Final next level of care: Assisted Living Barriers to Discharge: No Barriers Identified   Patient Goals and CMS Choice     Choice offered to / list presented to : Sibling [name] Sister)  Discharge Placement              Patient chooses bed at: [facility] [place] Patient to be transferred to facility by: [place] Name of family member notified: Frankie Patient and family notified of of transfer: 08/05/19  Discharge Plan and Services                                     Social Determinants of Health (SDOH) Interventions     Readmission Risk Interventions No flowsheet data found.

## 2019-08-05 NOTE — Progress Notes (Signed)
Progress Note  Patient Name: [name] Date of Encounter: 08/05/2019  Primary Cardiologist: Fransico Him, MD   Subjective   In SR. Sleeping.   Inpatient Medications    Scheduled Meds: . alfuzosin  10 mg Oral QPM  . amiodarone  200 mg Oral BID  . apixaban  2.5 mg Oral BID  . brimonidine  1 drop Both Eyes BID   And  . timolol  1 drop Both Eyes BID  . brinzolamide  1 drop Right Eye TID  . carbidopa-levodopa  1 tablet Oral TID  . clopidogrel  75 mg Oral Daily  . colesevelam  1,875 mg Oral QPM  . diltiazem  120 mg Oral Daily  . fluticasone  2 spray Each Nare Daily  . insulin aspart  0-9 Units Subcutaneous TID WC  . latanoprost  1 drop Right Eye QHS  . levothyroxine  150 mcg Oral Daily  . loratadine  5 mg Oral QHS  . melatonin  3 mg Oral QHS  . metoprolol succinate  25 mg Oral Daily  . mirabegron ER  50 mg Oral Daily  . pantoprazole  40 mg Oral Daily  . polyethylene glycol  17 g Oral Daily  . polyvinyl alcohol  1 drop Both Eyes QID  . sertraline  25 mg Oral Daily   Continuous Infusions:  PRN Meds: acetaminophen, benzonatate, Muscle Rub, ondansetron (ZOFRAN) IV, traMADol   Vital Signs    Vitals:   08/04/19 1619 08/04/19 2018 08/05/19 0432 08/05/19 0954  BP: (!) 152/81 (!) 145/80 (!) 157/84 (!) 164/101  Pulse: 66 60 70   Resp: 18 16 18    Temp: (!) 97.2 F (36.2 C) (!) 97.2 F (36.2 C) 97.6 F (36.4 C)   TempSrc: Oral Oral Oral   SpO2: 97% 98% 98%   Weight:   66.2 kg     Intake/Output Summary (Last 24 hours) at 08/05/2019 1110 Last data filed at 08/05/2019 1000 Gross per 24 hour  Intake --  Output 900 ml  Net -900 ml   Filed Weights   08/03/19 0547 08/04/19 0351 08/05/19 0432  Weight: 71 kg 67.9 kg 66.2 kg    Telemetry    SR - Personally Reviewed  ECG    No new - Personally Reviewed  Physical Exam   GEN: No acute distress.   Neck: No JVD Cardiac: RRR, no murmurs, rubs, or gallops.  Respiratory: Clear to auscultation bilaterally. GI:  Soft, nontender, non-distended  MS: No edema; No deformity. Neuro/Psych: very HOH, no focal deficits. Unable to accurate assess orientation, calm.   Labs    Chemistry Recent Labs  Lab 08/01/19 5409 08/01/19 8119 08/03/19 0543 08/03/19 0543 08/03/19 0546 08/04/19 0524 08/05/19 0553  NA 140   < > 139   < > 142 138 138  K 4.3   < > 3.8   < > 3.7 3.9 4.1  CL 109   < > 109   < > 107 107 105  CO2 23   < > 20*  --   --  24 21*  GLUCOSE 118*   < > 132*   < > 126* 97 78  BUN 30*   < > 36*   < > 34* 29* 28*  CREATININE 1.68*   < > 1.65*   < > 1.70* 1.39* 1.34*  CALCIUM 8.4*   < > 8.6*  --   --  8.8* 9.1  PROT 5.8*  --  5.8*  --   --   --   --  ALBUMIN 3.3*  --  3.3*  --   --   --   --   AST 8*  --  7*  --   --   --   --   ALT <5  --  7  --   --   --   --   ALKPHOS 70  --  73  --   --   --   --   BILITOT 0.8  --  0.8  --   --   --   --   GFRNONAA 36*   < > 37*  --   --  46* 48*  GFRAA 42*   < > 43*  --   --  53* 56*  ANIONGAP 8   < > 10  --   --  7 12   < > = values in this interval not displayed.     Hematology Recent Labs  Lab 08/03/19 0543 08/03/19 0543 08/03/19 0546 08/04/19 0524 08/05/19 0553  WBC 5.4  --   --  5.0 4.9  RBC 3.89*  --   --  3.78* 4.08*  HGB 12.1*   < > 12.2* 11.6* 12.4*  HCT 37.8*   < > 36.0* 36.1* 38.3*  MCV 97.2  --   --  95.5 93.9  MCH 31.1  --   --  30.7 30.4  MCHC 32.0  --   --  32.1 32.4  RDW 12.3  --   --  12.2 11.9  PLT 176  --   --  179 173   < > = values in this interval not displayed.    Cardiac EnzymesNo results for input(s): TROPONINI in the last 168 hours. No results for input(s): TROPIPOC in the last 168 hours.   BNPNo results for input(s): BNP, PROBNP in the last 168 hours.   DDimer No results for input(s): DDIMER in the last 168 hours.   Radiology    No results found.  Cardiac Studies    Patient Profile    CAIDYN HENRICKSEN is a 86 y.o. male with a hx of CAD with STEMI 07/2017 s/p DES to LCx (25% mRCA and 70% oOM1  residual), paroxysmal atrial fibrillation, type 2 DM, HTN, statin intolerance, CKD stage III (baseline appears 1.3-1.7) CVA, prostate CA, Parkinson's disease, normocytic anemia, hypothyroidism, depression/anxiety, discolored urine related to uro-MP rx who is being seen today for the evaluation of atrial fibrillation at the request of Dr. Lorin Mercy.  Assessment & Plan   Principal Problem:   Atrial fibrillation with RVR (HCC) Active Problems:   Hypothyroidism (post surgical)   Benign essential HTN   CKD (chronic kidney disease), stage III   Type II diabetes mellitus with renal manifestations (HCC)   Hyperlipidemia LDL goal <70   Parkinson's disease ([place])   Near syncope   Afib - recurrent episodes of afib, currently in SR. Recommend amiodarone 200 mg BID with plan for 5 gram load, then transition to 200 mg daily on 08/17/19. Can review this with afib clinic for any changes in therapy needed. Resume home diltiazem. Continue eliquis for CHADS2VASC 7.   CAD - mildly elevated troponin, continue plavix, discuss duration of antiplatelet therapy at cardiology visit.   Can likely dc home from CV perspective if feeling well.  Follow up has been arranged.   For questions or updates, please contact [place] Please consult [email or web address] for contact info under        Signed, Elouise Munroe, MD  08/05/2019,  11:10 AM

## 2019-08-05 NOTE — Discharge Summary (Signed)
Physician Discharge Summary  [name] FFM:[id number] DOB: [birth year] DOA: 08/03/2019  PCP: Patient, No Pcp Per  Admit date: 08/03/2019 Discharge date: 08/05/2019  Admitted From: [facility] Disposition: Back to [facility] Discharging physician: Dwyane Dee, MD  Recommendations for Outpatient Follow-up:  1. Follow up with afib clinic 2. Follow up with cardiology  Patient discharged to [facility] in Discharge Condition: stable CODE STATUS: DNR/DNI Diet recommendation:  Diet Orders (From admission, onward)    Start     Ordered   08/05/19 0000  Diet - low sodium heart healthy        08/05/19 1343   08/03/19 0948  Diet heart healthy/carb modified Room service appropriate? Yes; Fluid consistency: Thin  Diet effective now       Question Answer Comment  Diet-HS Snack? Nothing   Room service appropriate? Yes   Fluid consistency: Thin      08/03/19 0951          Hospital Course: [name] is an 86 y o CM with PMH afib, CVA, prostate cancer, hypothyroidism, hypertension, hyperlipidemia, type 2 diabetes, CKD 3 who presented to the hospital with a near syncopal event, slurred speech, and left facial droop.  He was noted to be fine the day prior to admission and overnight however upon waking up around 4 AM the morning of admission he was noted to be weak and fatigued.  On work-up he was found to be in A. fib with RVR which he converts in and out of intermittently.  He follows closely outpatient with cardiology.  He was started on a Cardizem infusion on admission due to RVR.  He converted back to normal sinus rhythm spontaneously.  Given his increased ability for converting to RVR, he was started on amiodarone and transitioned back to oral Cardizem.  His iodine allergy was not considered to be significant in the setting of initiating amiodarone after discussion with the cardiology pharmacist.  He was continued on amiodarone 200 mg twice daily at discharge and on 08/17/2019 he will  transition to amiodarone 200 mg daily. He should follow up with the A. fib clinic and cardiology at discharge as well. He had converted back to NSR prior to discharge with no further symptoms and was considered stable for discharging back to his nursing facility.   Hypothyroidism (post surgical) -Continue Synthroid  Benign essential HTN Continue current regimen  CKD (chronic kidney disease), stage III -Baseline creatinine 1.4-1.6, currently at baseline -Avoid nephrotoxic agents as able -Continue following BMP daily  Type II diabetes mellitus with renal manifestations ([place]) -Continue SSI and POC glucose  Atrial fibrillation with RVR ([place]) -Follows outpatient with cardiology, appreciate assistance -Initiated on a Cardizem infusion on admission and responded well.  He converted back to NSR spontaneously -Cardiology has discussed amiodarone with pharmacy regarding initial concern for interaction with his iodine allergy; no concern after discussion -Continue Cardizem and Eliquis -Amiodarone being initiated per cardiology. Discharged on amiodarone 200 mg twice daily then transitioning to amiodarone 200 mg daily on 08/17/2019 -Outpatient follow-up with A. fib clinic and cardiology  Hyperlipidemia LDL goal <70 -Continue WelChol  Parkinson's disease ([place]) -Continue Sinemet  Near syncope -Blood pressure remained stable and well controlled    The patient's chronic medical conditions were treated accordingly per the patient's home medication regimen except as noted.  On day of discharge, patient was felt deemed stable for discharge. Patient/family member advised to call PCP or come back to ER if needed.   Discharge Diagnoses:  Principal Diagnosis: Atrial fibrillation with RVR [facility])  Active Hospital Problems   Diagnosis Date Noted  . Parkinson's disease ([place]) 11/13/2018  . Hyperlipidemia LDL goal <70   . CKD (chronic kidney disease), stage III 08/03/2017  . Type II diabetes mellitus  with renal manifestations ([place]) 08/03/2017  . Benign essential HTN 10/24/2015  . Hypothyroidism (post surgical) 10/15/2006    Resolved Hospital Problems   Diagnosis Date Noted Date Resolved  . Atrial fibrillation with RVR ([place]) 08/14/2017 08/05/2019    Priority: High  . Near syncope 08/03/2019 08/05/2019    Discharge Instructions    Amb referral to AFIB Clinic   Complete by: As directed    Diet - low sodium heart healthy   Complete by: As directed      Allergies as of 08/05/2019      Reactions   Colchicine Anaphylaxis   Hibiclens [chlorhexidine] Hives   Atorvastatin Other (See Comments)   Joint pain    Ceftin Diarrhea   Celebrex [celecoxib] Swelling   Codeine Hives   Erythromycin Diarrhea   Ezetimibe Other (See Comments)   Joint pain    Iodinated Diagnostic Agents Nausea And Vomiting, Rash   Oxycodone-acetaminophen Hives   Rosuvastatin Other (See Comments)   Joint pain    Simvastatin Other (See Comments)   Joint pain    Cefuroxime    Celebrex [celecoxib]    Codeine    Crestor [rosuvastatin]    Erythromycin    Lipitor [atorvastatin]    Nitroglycerin    Percocet [oxycodone-acetaminophen]    Protonix [pantoprazole]    Tetanus Toxoids    Cefpodoxime Rash   Cefuroxime Axetil Rash   Nitroglycerin Other (See Comments)   Lowers patient's B/P   Vantin Other (See Comments)   Unknown/not noted on MAR??      Medication List    STOP taking these medications   furosemide 20 MG tablet Commonly known as: LASIX   potassium chloride 10 MEQ tablet Commonly known as: KLOR-CON     TAKE these medications   alfuzosin 10 MG 24 hr tablet Commonly known as: UROXATRAL Take 1 tablet (10 mg total) by mouth at bedtime. What changed: when to take this   Alogliptin Benzoate 12.5 MG Tabs Take 12.5 mg by mouth daily.   amiodarone 200 MG tablet Commonly known as: PACERONE Take 1 tablet (200 mg total) by mouth 2 (two) times daily for 11 days.   amiodarone 200 MG  tablet Commonly known as: Pacerone Take 1 tablet (200 mg total) by mouth daily. Start taking on: August 17, 2019   apixaban 2.5 MG Tabs tablet Commonly known as: ELIQUIS Take 2.5 mg by mouth 2 (two) times daily.   ARTIFICIAL TEARS OP Place 1 drop into both eyes 4 (four) times daily.   benzonatate 100 MG capsule Commonly known as: TESSALON Take 100 mg by mouth every 8 (eight) hours as needed for cough.   brinzolamide 1 % ophthalmic suspension Commonly known as: AZOPT Place 1 drop into the right eye 3 (three) times daily.   carbidopa-levodopa 25-100 MG tablet Commonly known as: SINEMET IR Take 1 tablet by mouth 3 (three) times daily.   clopidogrel 75 MG tablet Commonly known as: PLAVIX Take 75 mg by mouth daily.   colesevelam 625 MG tablet Commonly known as: WELCHOL Take 1,875 mg by mouth every evening.   Combigan 0.2-0.5 % ophthalmic solution Generic drug: brimonidine-timolol Place 1 drop into both eyes 2 (two) times daily.   diltiazem 120 MG 24 hr capsule  Commonly known as: CARDIZEM CD Take 1 capsule (120 mg total) by mouth daily for 30 days.   docusate sodium 100 MG capsule Commonly known as: COLACE Take 100 mg by mouth 2 (two) times daily.   ferrous sulfate 325 (65 FE) MG EC tablet Take 1 tablet (325 mg total) by mouth 2 (two) times daily with a meal.   fluticasone 50 MCG/ACT nasal spray Commonly known as: FLONASE Place 2 sprays into both nostrils daily.   latanoprost 0.005 % ophthalmic solution Commonly known as: XALATAN Place 1 drop into the right eye at bedtime.   levocetirizine 5 MG tablet Commonly known as: XYZAL Take 2.5 mg by mouth at bedtime.   levothyroxine 150 MCG tablet Commonly known as: SYNTHROID Take 150 mcg by mouth daily.   Melatonin 3 MG Subl Place 3 mg under the tongue at bedtime.   metoprolol succinate 25 MG 24 hr tablet Commonly known as: TOPROL-XL Take 1 tablet (25 mg total) by mouth daily. Start taking on: August 06, 2019 What  changed: how much to take   mirabegron ER 50 MG Tb24 tablet Commonly known as: MYRBETRIQ Take 1 tablet (50 mg total) by mouth daily.   pantoprazole 40 MG tablet Commonly known as: PROTONIX Take 40 mg by mouth daily.   polyethylene glycol 17 g packet Commonly known as: MIRALAX / GLYCOLAX Take 17 g by mouth daily. MIX AND DRINK   PreserVision/Lutein Caps Take 1 capsule by mouth 2 (two) times daily.   sertraline 25 MG tablet Commonly known as: ZOLOFT Take 25 mg by mouth daily.   traMADol 50 MG tablet Commonly known as: Ultram Take 1 tablet (50 mg total) by mouth every 6 (six) hours as needed. What changed: reasons to take this   Uro-MP 118 MG Caps Take 1 capsule by mouth 3 (three) times daily.   Vitamin B-12 2500 MCG Subl Place 1 tablet under the tongue daily.   Vitamin D-3 125 MCG (5000 UT) Tabs Take 5,000 Units by mouth daily.       Follow-up Information    [facility] Follow up.   Specialty: Cardiology Why: Your appointment in the atrial fibrillation clinic has been rescheduled to Friday August 11, 2019 at 9:30 AM with Roderic Palau, nurse practitioner. Contact information: [address] [id number] [place] [address] [phone]             Allergies  Allergen Reactions  . Colchicine Anaphylaxis  . Hibiclens [Chlorhexidine] Hives  . Atorvastatin Other (See Comments)    Joint pain   . Ceftin Diarrhea  . Celebrex [Celecoxib] Swelling  . Codeine Hives  . Erythromycin Diarrhea  . Ezetimibe Other (See Comments)    Joint pain   . Iodinated Diagnostic Agents Nausea And Vomiting and Rash  . Oxycodone-Acetaminophen Hives  . Rosuvastatin Other (See Comments)    Joint pain   . Simvastatin Other (See Comments)    Joint pain   . Cefuroxime   . Celebrex [Celecoxib]   . Codeine   . Crestor [Rosuvastatin]   . Erythromycin   . Lipitor [Atorvastatin]   . Nitroglycerin   . Percocet [Oxycodone-Acetaminophen]    . Protonix [Pantoprazole]   . Tetanus Toxoids   . Cefpodoxime Rash  . Cefuroxime Axetil Rash  . Nitroglycerin Other (See Comments)    Lowers patient's B/P  . Vantin Other (See Comments)    Unknown/not noted on MAR??    Consultations: Cardiology   Procedures/Studies: CT Head Wo Contrast  Result Date: 07/26/2019 CLINICAL DATA:  Head trauma EXAM: CT HEAD WITHOUT CONTRAST TECHNIQUE: Contiguous axial images were obtained from the base of the skull through the vertex without intravenous contrast. COMPARISON:  None. FINDINGS: Brain: Hypodensities are seen within the bilateral periventricular white matter, left insula, and bilateral basal ganglia. Findings are consistent with age-indeterminate small vessel ischemic change, favor chronic. No other signs of acute infarct or hemorrhage. The lateral ventricles and remaining midline structures are unremarkable. There are no acute extra-axial fluid collections. No mass effect. Diffuse cerebral atrophy is noted. Vascular: There is extensive atherosclerosis.  No hyperdense vessel. Skull: Normal. Negative for fracture or focal lesion. Sinuses/Orbits: No acute finding. Other: None. IMPRESSION: 1. Likely chronic small-vessel ischemic changes throughout the white matter, left insula, and bilateral basal ganglia. 2. No acute hemorrhage. Electronically Signed   By: Randa Ngo M.D.   On: 07/26/2019 21:29   MR BRAIN WO CONTRAST  Result Date: 08/03/2019 CLINICAL DATA:  Neuro deficit. EXAM: MRI HEAD WITHOUT CONTRAST TECHNIQUE: Multiplanar, multiecho pulse sequences of the brain and surrounding structures were obtained without intravenous contrast. COMPARISON:  08/03/2019 head CT.  10/14/2014 MRI head. FINDINGS: Brain: Moderate diffuse parenchymal volume loss with ex vacuo dilatation. Remote right corona radiata, right thalamic and right cerebellar infarcts. Scattered and confluent supratentorial white matter T2/FLAIR hyperintense foci. No acute infarct. Tiny remote  right frontal and left cerebellar microhemorrhages. No midline shift or extra-axial fluid collection. No mass lesion. Vascular: Normal flow voids. Skull and upper cervical spine: Normal marrow signal. Sinuses/Orbits: Normal orbits. Sequela of bilateral lens replacement. Mild ethmoid sinus mucosal thickening with layering left sphenoid secretions. Trace right mastoid effusion. Other: None. IMPRESSION: No acute intracranial process. Small remote infarcts involving the right corona radiata, right thalamus and right cerebellum. Moderate cerebral atrophy. Mild chronic microvascular ischemic changes. Electronically Signed   By: Primitivo Gauze M.D.   On: 08/03/2019 08:17   DG Chest Portable 1 View  Result Date: 08/01/2019 CLINICAL DATA:  AFib EXAM: PORTABLE CHEST 1 VIEW COMPARISON:  July 12, 2019, November 13, 2018, August 13, 2017 FINDINGS: The cardiomediastinal silhouette is unchanged in contour. Moderate hiatal hernia. Atherosclerotic calcifications. No pleural effusion. No pneumothorax. Unchanged bibasilar interstitial prominence with unchanged bibasilar heterogeneous opacities, likely chronic scarring/sequela of chronic aspiration. Visualized abdomen is unremarkable. Degenerative changes of the thoracic spine. IMPRESSION: 1. Moderate hiatal hernia. 2. Unchanged bibasilar scarring/sequela of chronic aspiration. Electronically Signed   By: Valentino Saxon MD   On: 08/01/2019 09:01   DG Chest Port 1 View  Result Date: 07/12/2019 CLINICAL DATA:  Chest pain EXAM: PORTABLE CHEST 1 VIEW COMPARISON:  November 13, 2018 FINDINGS: Lungs are clear. Heart size and pulmonary vascularity are normal. Moderate hiatal type hernia present. No adenopathy. There is aortic atherosclerosis. There is calcification in each carotid artery. No bone lesions. IMPRESSION: Lungs clear.  Stable cardiac silhouette.  Hiatal hernia present. Aortic Atherosclerosis (ICD10-I70.0). There are also foci of carotid artery calcification  bilaterally. Electronically Signed   By: Lowella Grip III M.D.   On: 07/12/2019 09:52   ECHOCARDIOGRAM COMPLETE  Result Date: 07/12/2019    ECHOCARDIOGRAM REPORT   Patient Name:   TAEGAN STANDAGE [facility] Date of Exam: 07/12/2019 Medical Rec #:  [id number]      Height:       69.0 in Accession #:    [id number]     Weight:       158.0 lb Date of Birth:  [birth year]     BSA:  1.869 m Patient Age:    73 years       BP:           144/81 mmHg Patient Gender: M              HR:           70 bpm. Exam Location:  Inpatient Procedure: 2D Echo, Cardiac Doppler and Color Doppler Indications:    I48.0 Paroxysmal atrial fibrillation  History:        Patient has prior history of Echocardiogram examinations, most                 recent 03/11/2018. Stroke; Risk Factors:Hypertension, Diabetes                 and Dyslipidemia. Cancer. CKD. GERD.  Sonographer:    Jonelle Sidle Dance Referring Phys: [id number] [place]  1. Mild concentric LVH with moderate hypertrophy of the basal septum. Left ventricular ejection fraction, by estimation, is 55 to 60%. The left ventricle has normal function. The left ventricle has no regional wall motion abnormalities. There is mild concentric left ventricular hypertrophy. Left ventricular diastolic parameters are consistent with Grade II diastolic dysfunction (pseudonormalization).  2. Right ventricular systolic function is normal. The right ventricular size is normal. There is mildly elevated pulmonary artery systolic pressure.  3. Left atrial size was moderately dilated.  4. The mitral valve is normal in structure. Trivial mitral valve regurgitation. No evidence of mitral stenosis.  5. The aortic valve is tricuspid. Aortic valve regurgitation is mild. No aortic stenosis is present.  6. The inferior vena cava is dilated in size with <50% respiratory variability, suggesting right atrial pressure of 15 mmHg. FINDINGS  Left Ventricle: Mild concentric LVH with moderate hypertrophy of the  basal septum. Left ventricular ejection fraction, by estimation, is 55 to 60%. The left ventricle has normal function. The left ventricle has no regional wall motion abnormalities. The  left ventricular internal cavity size was normal in size. There is mild concentric left ventricular hypertrophy. Left ventricular diastolic parameters are consistent with Grade II diastolic dysfunction (pseudonormalization). Right Ventricle: The right ventricular size is normal. No increase in right ventricular wall thickness. Right ventricular systolic function is normal. There is mildly elevated pulmonary artery systolic pressure. The tricuspid regurgitant velocity is 2.42  m/s, and with an assumed right atrial pressure of 15 mmHg, the estimated right ventricular systolic pressure is 95.1 mmHg. Left Atrium: Left atrial size was moderately dilated. Right Atrium: Right atrial size was normal in size. Pericardium: There is no evidence of pericardial effusion. Mitral Valve: The mitral valve is normal in structure. There is mild thickening of the anterior and posterior mitral valve leaflet(s). Normal mobility of the mitral valve leaflets. Mild mitral annular calcification. Trivial mitral valve regurgitation. No  evidence of mitral valve stenosis. Tricuspid Valve: The tricuspid valve is normal in structure. Tricuspid valve regurgitation is trivial. No evidence of tricuspid stenosis. Aortic Valve: The aortic valve is tricuspid. . There is moderate thickening and moderate calcification of the aortic valve. Aortic valve regurgitation is mild. Aortic regurgitation PHT measures 447 msec. No aortic stenosis is present. There is moderate thickening of the aortic valve. There is moderate calcification of the aortic valve. Pulmonic Valve: The pulmonic valve was normal in structure. Pulmonic valve regurgitation is trivial. No evidence of pulmonic stenosis. Aorta: The aortic root is normal in size and structure. Venous: The inferior vena cava is  dilated in size with less than 50%  respiratory variability, suggesting right atrial pressure of 15 mmHg. IAS/Shunts: No atrial level shunt detected by color flow Doppler.  LEFT VENTRICLE PLAX 2D LVIDd:         3.97 cm LVIDs:         3.34 cm LV PW:         1.43 cm LV IVS:        1.21 cm LVOT diam:     2.10 cm LV SV:         55 LV SV Index:   29 LVOT Area:     3.46 cm  RIGHT VENTRICLE          IVC RV Basal diam:  3.24 cm  IVC diam: 2.47 cm RV Mid diam:    3.05 cm TAPSE (M-mode): 1.6 cm LEFT ATRIUM             Index       RIGHT ATRIUM           Index LA diam:        4.70 cm 2.51 cm/m  RA Area:     17.90 cm LA Vol (A2C):   78.0 ml 41.73 ml/m RA Volume:   40.70 ml  21.78 ml/m LA Vol (A4C):   51.2 ml 27.39 ml/m LA Biplane Vol: 67.1 ml 35.90 ml/m  AORTIC VALVE LVOT Vmax:   68.70 cm/s LVOT Vmean:  49.450 cm/s LVOT VTI:    0.158 m AI PHT:      447 msec  AORTA Ao Root diam: 3.80 cm Ao Asc diam:  3.50 cm MITRAL VALVE               TRICUSPID VALVE MV Area (PHT): 3.21 cm    TR Peak grad:   23.4 mmHg MV Decel Time: 236 msec    TR Vmax:        242.00 cm/s MV E velocity: 87.80 cm/s MV A velocity: 59.70 cm/s  SHUNTS MV E/A ratio:  1.47        Systemic VTI:  0.16 m                            Systemic Diam: 2.10 cm Skeet Latch MD Electronically signed by Skeet Latch MD Signature Date/Time: 07/12/2019/4:32:51 PM    Final    CT HEAD CODE STROKE WO CONTRAST  Result Date: 08/03/2019 CLINICAL DATA:  Code stroke.  Left facial droop and slurred speech. EXAM: CT HEAD WITHOUT CONTRAST TECHNIQUE: Contiguous axial images were obtained from the base of the skull through the vertex without intravenous contrast. COMPARISON:  12/30/2018 FINDINGS: Brain: There is no evidence of acute infarct, intracranial hemorrhage, mass, midline shift, or extra-axial fluid collection. Hypodensities in the cerebral white matter bilaterally are unchanged and nonspecific but compatible with mild chronic small vessel ischemic disease. Small chronic  infarcts are noted in the right thalamus and right cerebellum. There are dilated perivascular spaces in the basal ganglia. There is mild cerebral atrophy. Vascular: Calcified atherosclerosis at the skull base. No hyperdense vessel. Skull: No fracture or suspicious osseous lesion. Sinuses/Orbits: Small volume fluid in the left sphenoid sinus. Clear mastoid air cells. Bilateral cataract extraction. Other: None. ASPECTS [facility] Stroke Program Early CT Score) - Ganglionic level infarction (caudate, lentiform nuclei, internal capsule, insula, M1-M3 cortex): 7 - Supraganglionic infarction (M4-M6 cortex): 3 Total score (0-10 with 10 being normal): 10 IMPRESSION: 1. No evidence of acute intracranial abnormality. 2. ASPECTS is 10. 3. Mild chronic  small vessel ischemic disease. These results were communicated to Dr. Leonel Ramsay at 6:03 am on 08/03/2019 by text page via the [facility] messaging system. Electronically Signed   By: Logan Bores M.D.   On: 08/03/2019 06:03    Discharge Exam: BP (!) 164/101   Pulse 70   Temp 97.6 F (36.4 C) (Oral)   Resp 18   Wt 66.2 kg   SpO2 98%   BMI 20.95 kg/m  General appearance: Pleasant elderly man laying in bed in no distress with slightly slowed mentation but answers questions appropriately Head: Normocephalic, without obvious abnormality, atraumatic Eyes: EOMI Lungs: clear to auscultation bilaterally Heart: regular rate and rhythm and S1, S2 normal Abdomen: normal findings: bowel sounds normal Extremities: No lower extremity edema Skin: Skin color, texture, turgor normal. No rashes or lesions Neurologic: Grossly normal     The results of significant diagnostics from this hospitalization (including imaging, microbiology, ancillary and laboratory) are listed below for reference.     Microbiology: Recent Results (from the past 240 hour(s))  Urine culture     Status: Abnormal   Collection Time: 08/01/19  2:12 PM   Specimen: Urine, Random  Result Value Ref Range  Status   Specimen Description URINE, RANDOM  Final   Special Requests   Final    NONE Performed at [facility], [address] [place], [place] [address]    Culture 30,000 COLONIES/mL STAPHYLOCOCCUS EPIDERMIDIS (A)  Final   Report Status 08/03/2019 FINAL  Final   Organism ID, Bacteria STAPHYLOCOCCUS EPIDERMIDIS (A)  Final      Susceptibility   Staphylococcus epidermidis - MIC*    CIPROFLOXACIN >=8 RESISTANT Resistant     GENTAMICIN <=0.5 SENSITIVE Sensitive     NITROFURANTOIN <=16 SENSITIVE Sensitive     OXACILLIN >=4 RESISTANT Resistant     TETRACYCLINE <=1 SENSITIVE Sensitive     VANCOMYCIN 2 SENSITIVE Sensitive     TRIMETH/SULFA <=10 SENSITIVE Sensitive     CLINDAMYCIN <=0.25 SENSITIVE Sensitive     RIFAMPIN <=0.5 SENSITIVE Sensitive     Inducible Clindamycin NEGATIVE Sensitive     * 30,000 COLONIES/mL STAPHYLOCOCCUS EPIDERMIDIS  SARS Coronavirus 2 by RT PCR (hospital order, performed in [facility] hospital lab) Nasopharyngeal Nasopharyngeal Swab     Status: None   Collection Time: 08/03/19  9:24 AM   Specimen: Nasopharyngeal Swab  Result Value Ref Range Status   SARS Coronavirus 2 NEGATIVE NEGATIVE Final    Comment: (NOTE) SARS-CoV-2 target nucleic acids are NOT DETECTED.  The SARS-CoV-2 RNA is generally detectable in upper and lower respiratory specimens during the acute phase of infection. The lowest concentration of SARS-CoV-2 viral copies this assay can detect is 250 copies / mL. A negative result does not preclude SARS-CoV-2 infection and should not be used as the sole basis for treatment or other patient management decisions.  A negative result may occur with improper specimen collection / handling, submission of specimen other than nasopharyngeal swab, presence of viral mutation(s) within the areas targeted by this assay, and inadequate number of viral copies (<250 copies / mL). A negative result must be combined with clinical observations, patient history,  and epidemiological information.  Fact Sheet for Patients:   StrictlyIdeas.no  Fact Sheet for Healthcare Providers: BankingDealers.co.za  This test is not yet approved or  cleared by the [place] FDA and has been authorized for detection and/or diagnosis of SARS-CoV-2 by FDA under an Emergency Use Authorization (EUA).  This EUA will remain in effect (meaning  this test can be used) for the duration of the COVID-19 declaration under Section 564(b)(1) of the Act, 21 U.S.C. section 360bbb-3(b)(1), unless the authorization is terminated or revoked sooner.  Performed at [facility], [place] [address] [place], [place] [address]      Labs: BNP (last 3 results) No results for input(s): BNP in the last 8760 hours. Basic Metabolic Panel: Recent Labs  Lab 08/01/19 0839 08/03/19 0543 08/03/19 0546 08/04/19 0524 08/05/19 0553  NA 140 139 142 138 138  K 4.3 3.8 3.7 3.9 4.1  CL 109 109 107 107 105  CO2 23 20*  --  24 21*  GLUCOSE 118* 132* 126* 97 78  BUN 30* 36* 34* 29* 28*  CREATININE 1.68* 1.65* 1.70* 1.39* 1.34*  CALCIUM 8.4* 8.6*  --  8.8* 9.1  MG  --   --   --   --  2.0   Liver Function Tests: Recent Labs  Lab 08/01/19 0839 08/03/19 0543  AST 8* 7*  ALT <5 7  ALKPHOS 70 73  BILITOT 0.8 0.8  PROT 5.8* 5.8*  ALBUMIN 3.3* 3.3*   No results for input(s): LIPASE, AMYLASE in the last 168 hours. No results for input(s): AMMONIA in the last 168 hours. CBC: Recent Labs  Lab 08/01/19 0839 08/03/19 0543 08/03/19 0546 08/04/19 0524 08/05/19 0553  WBC 6.5 5.4  --  5.0 4.9  NEUTROABS 3.9 2.8  --   --  2.6  HGB 11.9* 12.1* 12.2* 11.6* 12.4*  HCT 37.6* 37.8* 36.0* 36.1* 38.3*  MCV 98.4 97.2  --  95.5 93.9  PLT 186 176  --  179 173   Cardiac Enzymes: No results for input(s): CKTOTAL, CKMB, CKMBINDEX, TROPONINI in the last 168 hours. BNP: Invalid input(s): POCBNP CBG: Recent Labs  Lab 08/04/19 1122  08/04/19 1615 08/04/19 2145 08/05/19 0759 08/05/19 1130  GLUCAP 169* 149* 79 79 83   D-Dimer No results for input(s): DDIMER in the last 72 hours. Hgb A1c No results for input(s): HGBA1C in the last 72 hours. Lipid Profile No results for input(s): CHOL, HDL, LDLCALC, TRIG, CHOLHDL, LDLDIRECT in the last 72 hours. Thyroid function studies Recent Labs    08/03/19 1002  TSH 1.064   Anemia work up No results for input(s): VITAMINB12, FOLATE, FERRITIN, TIBC, IRON, RETICCTPCT in the last 72 hours. Urinalysis    Component Value Date/Time   COLORURINE BLUE (A) 08/03/2019 0856   APPEARANCEUR CLEAR (A) 08/03/2019 0856   LABSPEC 1.016 08/03/2019 0856   PHURINE 7.0 08/03/2019 0856   GLUCOSEU NEGATIVE 08/03/2019 0856   HGBUR NEGATIVE 08/03/2019 0856   BILIRUBINUR NEGATIVE 08/03/2019 0856   KETONESUR 5 (A) 08/03/2019 0856   PROTEINUR NEGATIVE 08/03/2019 0856   UROBILINOGEN 0.2 05/26/2014 2040   NITRITE NEGATIVE 08/03/2019 0856   LEUKOCYTESUR TRACE (A) 08/03/2019 0856   Sepsis Labs Invalid input(s): PROCALCITONIN,  WBC,  LACTICIDVEN Microbiology Recent Results (from the past 240 hour(s))  Urine culture     Status: Abnormal   Collection Time: 08/01/19  2:12 PM   Specimen: Urine, Random  Result Value Ref Range Status   Specimen Description URINE, RANDOM  Final   Special Requests   Final    NONE Performed at [facility], [place] [address] [place], [place] [address]    Culture 30,000 COLONIES/mL STAPHYLOCOCCUS EPIDERMIDIS (A)  Final   Report Status 08/03/2019 FINAL  Final   Organism ID, Bacteria STAPHYLOCOCCUS EPIDERMIDIS (A)  Final      Susceptibility  Staphylococcus epidermidis - MIC*    CIPROFLOXACIN >=8 RESISTANT Resistant     GENTAMICIN <=0.5 SENSITIVE Sensitive     NITROFURANTOIN <=16 SENSITIVE Sensitive     OXACILLIN >=4 RESISTANT Resistant     TETRACYCLINE <=1 SENSITIVE Sensitive     VANCOMYCIN 2 SENSITIVE Sensitive     TRIMETH/SULFA <=10 SENSITIVE Sensitive      CLINDAMYCIN <=0.25 SENSITIVE Sensitive     RIFAMPIN <=0.5 SENSITIVE Sensitive     Inducible Clindamycin NEGATIVE Sensitive     * 30,000 COLONIES/mL STAPHYLOCOCCUS EPIDERMIDIS  SARS Coronavirus 2 by RT PCR (hospital order, performed in [place] hospital lab) Nasopharyngeal Nasopharyngeal Swab     Status: None   Collection Time: 08/03/19  9:24 AM   Specimen: Nasopharyngeal Swab  Result Value Ref Range Status   SARS Coronavirus 2 NEGATIVE NEGATIVE Final    Comment: (NOTE) SARS-CoV-2 target nucleic acids are NOT DETECTED.  The SARS-CoV-2 RNA is generally detectable in upper and lower respiratory specimens during the acute phase of infection. The lowest concentration of SARS-CoV-2 viral copies this assay can detect is 250 copies / mL. A negative result does not preclude SARS-CoV-2 infection and should not be used as the sole basis for treatment or other patient management decisions.  A negative result may occur with improper specimen collection / handling, submission of specimen other than nasopharyngeal swab, presence of viral mutation(s) within the areas targeted by this assay, and inadequate number of viral copies (<250 copies / mL). A negative result must be combined with clinical observations, patient history, and epidemiological information.  Fact Sheet for Patients:   StrictlyIdeas.no  Fact Sheet for Healthcare Providers: BankingDealers.co.za  This test is not yet approved or  cleared by the [place] FDA and has been authorized for detection and/or diagnosis of SARS-CoV-2 by FDA under an Emergency Use Authorization (EUA).  This EUA will remain in effect (meaning this test can be used) for the duration of the COVID-19 declaration under Section 564(b)(1) of the Act, 21 U.S.C. section 360bbb-3(b)(1), unless the authorization is terminated or revoked sooner.  Performed at [facility], [place] [address],  [place], [place] [address]      Time coordinating discharge: Over 75 minutes    Dwyane Dee, MD  [facility] 08/05/2019, 1:48 PM Pager: Secure chat  If 7PM-7AM, please contact night-coverage [email or web address] Password [email or web address]

## 2019-08-05 NOTE — Progress Notes (Signed)

## 2019-08-08 LAB — GLUCOSE, CAPILLARY: Glucose-Capillary: 112 mg/dL — ABNORMAL HIGH (ref 70–99)

## 2019-08-10 ENCOUNTER — Other Ambulatory Visit: Payer: Self-pay

## 2019-08-10 ENCOUNTER — Encounter (HOSPITAL_COMMUNITY): Payer: Self-pay | Admitting: Physician Assistant

## 2019-08-10 ENCOUNTER — Ambulatory Visit (HOSPITAL_COMMUNITY)
Admission: RE | Admit: 2019-08-10 | Discharge: 2019-08-10 | Disposition: A | Discharge status: Home / Self Care | Payer: Medicare Other | Source: Ambulatory Visit | Attending: Nurse Practitioner | Admitting: Nurse Practitioner

## 2019-08-10 VITALS — BP 142/80 | HR 61 | Ht 70.0 in | Wt 160.6 lb

## 2019-08-10 DIAGNOSIS — Z955 Presence of coronary angioplasty implant and graft: Secondary | ICD-10-CM | POA: Insufficient documentation

## 2019-08-10 DIAGNOSIS — I440 Atrioventricular block, first degree: Secondary | ICD-10-CM | POA: Diagnosis not present

## 2019-08-10 DIAGNOSIS — Z888 Allergy status to other drugs, medicaments and biological substances status: Secondary | ICD-10-CM | POA: Insufficient documentation

## 2019-08-10 DIAGNOSIS — Z7901 Long term (current) use of anticoagulants: Secondary | ICD-10-CM | POA: Insufficient documentation

## 2019-08-10 DIAGNOSIS — E039 Hypothyroidism, unspecified: Secondary | ICD-10-CM | POA: Diagnosis not present

## 2019-08-10 DIAGNOSIS — Z881 Allergy status to other antibiotic agents status: Secondary | ICD-10-CM | POA: Diagnosis not present

## 2019-08-10 DIAGNOSIS — Z86718 Personal history of other venous thrombosis and embolism: Secondary | ICD-10-CM | POA: Diagnosis not present

## 2019-08-10 DIAGNOSIS — N183 Chronic kidney disease, stage 3 unspecified: Secondary | ICD-10-CM | POA: Insufficient documentation

## 2019-08-10 DIAGNOSIS — E1122 Type 2 diabetes mellitus with diabetic chronic kidney disease: Secondary | ICD-10-CM | POA: Diagnosis not present

## 2019-08-10 DIAGNOSIS — K219 Gastro-esophageal reflux disease without esophagitis: Secondary | ICD-10-CM | POA: Insufficient documentation

## 2019-08-10 DIAGNOSIS — Z79899 Other long term (current) drug therapy: Secondary | ICD-10-CM | POA: Diagnosis not present

## 2019-08-10 DIAGNOSIS — Z96653 Presence of artificial knee joint, bilateral: Secondary | ICD-10-CM | POA: Insufficient documentation

## 2019-08-10 DIAGNOSIS — I2510 Atherosclerotic heart disease of native coronary artery without angina pectoris: Secondary | ICD-10-CM | POA: Diagnosis not present

## 2019-08-10 DIAGNOSIS — Z7902 Long term (current) use of antithrombotics/antiplatelets: Secondary | ICD-10-CM | POA: Insufficient documentation

## 2019-08-10 DIAGNOSIS — I480 Paroxysmal atrial fibrillation: Secondary | ICD-10-CM | POA: Diagnosis not present

## 2019-08-10 DIAGNOSIS — I252 Old myocardial infarction: Secondary | ICD-10-CM | POA: Insufficient documentation

## 2019-08-10 DIAGNOSIS — D6869 Other thrombophilia: Secondary | ICD-10-CM | POA: Insufficient documentation

## 2019-08-10 DIAGNOSIS — G20 Parkinson's disease: Secondary | ICD-10-CM | POA: Insufficient documentation

## 2019-08-10 DIAGNOSIS — Z885 Allergy status to narcotic agent status: Secondary | ICD-10-CM | POA: Diagnosis not present

## 2019-08-10 DIAGNOSIS — Z79890 Hormone replacement therapy (postmenopausal): Secondary | ICD-10-CM | POA: Diagnosis not present

## 2019-08-10 DIAGNOSIS — I129 Hypertensive chronic kidney disease with stage 1 through stage 4 chronic kidney disease, or unspecified chronic kidney disease: Secondary | ICD-10-CM | POA: Insufficient documentation

## 2019-08-10 DIAGNOSIS — Z85828 Personal history of other malignant neoplasm of skin: Secondary | ICD-10-CM | POA: Diagnosis not present

## 2019-08-10 DIAGNOSIS — M1990 Unspecified osteoarthritis, unspecified site: Secondary | ICD-10-CM | POA: Diagnosis not present

## 2019-08-10 DIAGNOSIS — Z87442 Personal history of urinary calculi: Secondary | ICD-10-CM | POA: Insufficient documentation

## 2019-08-10 DIAGNOSIS — Z8546 Personal history of malignant neoplasm of prostate: Secondary | ICD-10-CM | POA: Insufficient documentation

## 2019-08-10 DIAGNOSIS — Z8249 Family history of ischemic heart disease and other diseases of the circulatory system: Secondary | ICD-10-CM | POA: Insufficient documentation

## 2019-08-10 NOTE — Progress Notes (Signed)
Primary Care Physician: Patient, No Pcp Per Primary Cardiologist: Dr Radford Pax Primary Electrophysiologist: none Referring Physician: [facility]   [name] is a 86 y.o. male with a history of CKD stage III, DM type II, DVTs, GERD, hypothyroidism, hypertension, paroxysmal A. fib, prostate cancer who presents for consultation in the [facility]. Patient is on Eliquis for a CHADS2VASC score of 7. Patient has been to the ED several times recently for afib with RVR. Most recently he was discharged on 08/05/19 after being admitted for near-syncope and found to be in afib with RVR. He was loaded on amiodarone for rhythm control. He converted to SR on the medication. Since then, patient reports he has done well with no heart racing or palpitations. He is tolerating amiodarone without difficulty. He denies bleeding issues on anticoagulation.   Today, he denies symptoms of palpitations, chest pain, shortness of breath, orthopnea, PND, lower extremity edema, dizziness, presyncope, syncope, snoring, daytime somnolence, bleeding, or neurologic sequela. The patient is tolerating medications without difficulties and is otherwise without complaint today.    Atrial Fibrillation Risk Factors:  he does not have symptoms or diagnosis of sleep apnea. he does not have a history of rheumatic fever. he does not have a history of alcohol use.   he has a BMI of Body mass index is 23.04 kg/m.Marland Kitchen Filed Weights   08/10/19 1410  Weight: 72.8 kg    Family History  Problem Relation Age of Onset  . Other Sister        ophth disease  . Heart attack Father        x7  . Heart disease Father        MI x7  . Hypertension Mother   . Stroke Mother 10  . Amblyopia Neg Hx   . Blindness Neg Hx   . Glaucoma Neg Hx   . Macular degeneration Neg Hx   . Retinal detachment Neg Hx   . Strabismus Neg Hx   . Retinitis pigmentosa Neg Hx   . Cataracts Neg Hx      Atrial Fibrillation  Management history:  Previous antiarrhythmic drugs: amiodarone Previous cardioversions: none Previous ablations: none CHADS2VASC score: 7 Anticoagulation history: Eliquis    Past Medical History:  Diagnosis Date  . Allergic rhinitis   . Arthritis    arthritis-kyphosis  . Asthmatic bronchitis   . CAD in native artery    a. STEMI 07/2017 s/p DES to LCx (25% mRCA and 70% oOM1 residual).  . CKD (chronic kidney disease) stage 3, GFR 30-59 ml/min   . DM type 2 (diabetes mellitus, type 2) ([place])   . DVT, lower extremity, proximal, acute ([place])   . Dyslipidemia   . GERD (gastroesophageal reflux disease)   . Hearing impaired    bilateral hearing aids  . History of kidney stones   . History of skin cancer   . Hypertension   . Hypothyroidism   . Macular degeneration    injections done bilaterally recent - 08-22-13  . Normocytic anemia   . PAF (paroxysmal atrial fibrillation) ([place])   . Parkinson's disease ([place])   . Prostate cancer [facility])    Prostate cancer-radiation only,skin cancer-basal cell and last squamous cell right eye  . Statin intolerance   . Stroke ([place])   . Urgency-frequency syndrome   . Urticaria    Past Surgical History:  Procedure Laterality Date  . BACK SURGERY     lumbar fracture  . cataract surgery Bilateral   .  COLONOSCOPY W/ POLYPECTOMY     x2   . CORONARY STENT INTERVENTION N/A 08/06/2017   Procedure: CORONARY STENT INTERVENTION;  Surgeon: Jettie Booze, MD;  Location: [facility];  Service: Cardiovascular;  Laterality: N/A;  . CYSTOSCOPY W/ URETERAL STENT PLACEMENT Right 08/13/2017   Procedure: CYSTOSCOPY WITH RIGHT  RETROGRADE PYELOGRAM/URETERAL STENT PLACEMENT;  Surgeon: Raynelle Bring, MD;  Location: [facility];  Service: Urology;  Laterality: Right;  . CYSTOSCOPY WITH INJECTION N/A 09/11/2013   Procedure: CYSTOSCOPY WITH BOTOX INJECTION INTO THE BLADDER;  Surgeon: Ailene Rud, MD;  Location: [facility];  Service: Urology;  Laterality: N/A;  .  CYSTOSCOPY WITH RETROGRADE PYELOGRAM, URETEROSCOPY AND STENT PLACEMENT    . CYSTOSCOPY WITH RETROGRADE PYELOGRAM, URETEROSCOPY AND STENT PLACEMENT Right 12/22/2018   Procedure: CYSTOSCOPY WITH RETROGRADE PYELOGRAM, URETEROSCOPY AND STENT PLACEMENT;  Surgeon: Cleon Gustin, MD;  Location: [facility];  Service: Urology;  Laterality: Right;  90 MINS  . CYSTOSCOPY WITH STENT PLACEMENT Right 01/26/2016   Procedure: CYSTOSCOPY, RIGHT RETROGRADE WITH RIGHT URETERAL STENT PLACEMENT;  Surgeon: Cleon Gustin, MD;  [facility];  Service: Urology;  Laterality: Right;  . CYSTOSCOPY/RETROGRADE/URETEROSCOPY Right 04/22/2016   Procedure: CYSTOSCOPY/RETROGRADE/ REMOVAL JJ STENT right insertion stent right insertion of foley;  Surgeon: Franchot Gallo, MD;  Location: [facility];  Service: Urology;  Laterality: Right;  . EXTRACORPOREAL SHOCK WAVE LITHOTRIPSY Right 02/27/2016   Procedure: RIGHT EXTRACORPOREAL SHOCK WAVE LITHOTRIPSY (ESWL) GAITED;  Surgeon: Cleon Gustin, MD;  Location: [facility];  Service: Urology;  Laterality: Right;  . EXTRACORPOREAL SHOCK WAVE LITHOTRIPSY Right 02/10/2016   Procedure: EXTRACORPOREAL SHOCK WAVE LITHOTRIPSY (ESWL);  Surgeon: Kathie Rhodes, MD;  Location: [facility];  Service: Urology;  Laterality: Right;  [phone] [id number] BCBS- [id number]   . EYE SURGERY     macular degeneration  . GALLBLADDER SURGERY  2006   no cholecystectomy  . HERNIA REPAIR    . HOLMIUM LASER APPLICATION Right 01/65/5374   Procedure: HOLMIUM LASER APPLICATION;  Surgeon: Cleon Gustin, MD;  Location: [facility];  Service: Urology;  Laterality: Right;  . LEFT HEART CATH AND CORONARY ANGIOGRAPHY N/A 08/06/2017   Procedure: LEFT HEART CATH AND CORONARY ANGIOGRAPHY;  Surgeon: Jettie Booze, MD;  Location: [facility];  Service: Cardiovascular;  Laterality: N/A;  . LUNG REMOVAL, PARTIAL  age 28   for bronchiectasis  . PARTIAL THYMECTOMY  1985   for  nodules  . POPLITEAL SYNOVIAL CYST EXCISION  2005  . TOTAL KNEE ARTHROPLASTY  2008   right  . TOTAL KNEE ARTHROPLASTY  2010   left    Current Outpatient Medications  Medication Sig Dispense Refill  . alfuzosin (UROXATRAL) 10 MG 24 hr tablet Take 1 tablet (10 mg total) by mouth at bedtime. 30 tablet 0  . Alogliptin Benzoate 12.5 MG TABS Take 12.5 mg by mouth daily.     Marland Kitchen amiodarone (PACERONE) 200 MG tablet Take 1 tablet (200 mg total) by mouth 2 (two) times daily for 11 days. 22 tablet 0  . [START ON 08/17/2019] amiodarone (PACERONE) 200 MG tablet Take 1 tablet (200 mg total) by mouth daily.    Marland Kitchen apixaban (ELIQUIS) 2.5 MG TABS tablet Take 2.5 mg by mouth 2 (two) times daily.    . brimonidine-timolol (COMBIGAN) 0.2-0.5 % ophthalmic solution Place 1 drop into both eyes 2 (two) times daily.    . brinzolamide (AZOPT) 1 % ophthalmic suspension Place 1 drop into the right eye 3 (three) times  daily.     . carbidopa-levodopa (SINEMET IR) 25-100 MG tablet Take 1 tablet by mouth 3 (three) times daily.     . Carboxymethylcellulose Sodium (ARTIFICIAL TEARS OP) Place 1 drop into both eyes 4 (four) times daily.    . Cholecalciferol (VITAMIN D-3) 5000 units TABS Take 5,000 Units by mouth daily.    . clopidogrel (PLAVIX) 75 MG tablet Take 75 mg by mouth daily.     . colesevelam (WELCHOL) 625 MG tablet Take 1,875 mg by mouth every evening.     . Cyanocobalamin (VITAMIN B-12) 2500 MCG SUBL Place 1 tablet under the tongue daily.    Marland Kitchen diltiazem (CARDIZEM CD) 120 MG 24 hr capsule Take 1 capsule (120 mg total) by mouth daily for 30 days. 30 capsule 0  . docusate sodium (COLACE) 100 MG capsule Take 100 mg by mouth 2 (two) times daily.     . ferrous sulfate 325 (65 FE) MG EC tablet Take 1 tablet (325 mg total) by mouth 2 (two) times daily with a meal. 60 tablet 11  . fluticasone (FLONASE) 50 MCG/ACT nasal spray Place 2 sprays into both nostrils daily.    Marland Kitchen latanoprost (XALATAN) 0.005 % ophthalmic solution Place 1  drop into the right eye at bedtime.     Marland Kitchen levocetirizine (XYZAL) 5 MG tablet Take 2.5 mg by mouth at bedtime.     Marland Kitchen levothyroxine (SYNTHROID) 150 MCG tablet Take 150 mcg by mouth daily.     . Melatonin 3 MG SUBL Place 3 mg under the tongue at bedtime.    . Meth-Hyo-M Bl-Na Phos-Ph Sal (URO-MP) 118 MG CAPS Take 1 capsule by mouth 3 (three) times daily.    . metoprolol succinate (TOPROL-XL) 25 MG 24 hr tablet Take 1 tablet (25 mg total) by mouth daily.    . mirabegron ER (MYRBETRIQ) 50 MG TB24 tablet Take 1 tablet (50 mg total) by mouth daily. 30 tablet 0  . Multiple Vitamins-Minerals (PRESERVISION/LUTEIN) CAPS Take 1 capsule by mouth 2 (two) times daily.    . pantoprazole (PROTONIX) 40 MG tablet Take 40 mg by mouth daily.    . polyethylene glycol (MIRALAX / GLYCOLAX) packet Take 17 g by mouth daily. [place]    . sertraline (ZOLOFT) 25 MG tablet Take 25 mg by mouth daily.    . traMADol (ULTRAM) 50 MG tablet Take 1 tablet (50 mg total) by mouth every 6 (six) hours as needed. (Patient taking differently: Take 50 mg by mouth every 6 (six) hours as needed for moderate pain. ) 30 tablet 0   No current facility-administered medications for this encounter.    Allergies  Allergen Reactions  . Colchicine Anaphylaxis  . Hibiclens [Chlorhexidine] Hives  . Atorvastatin Other (See Comments)    Joint pain   . Ceftin Diarrhea  . Celebrex [Celecoxib] Swelling  . Codeine Hives  . Erythromycin Diarrhea  . Ezetimibe Other (See Comments)    Joint pain   . Iodinated Diagnostic Agents Nausea And Vomiting and Rash  . Oxycodone-Acetaminophen Hives  . Rosuvastatin Other (See Comments)    Joint pain   . Simvastatin Other (See Comments)    Joint pain   . Cefuroxime   . Celebrex [Celecoxib]   . Codeine   . Crestor [Rosuvastatin]   . Erythromycin   . Lipitor [Atorvastatin]   . Nitroglycerin   . Percocet [Oxycodone-Acetaminophen]   . Protonix [Pantoprazole]   . Tetanus Toxoids   . Cefpodoxime Rash    . Cefuroxime Axetil  Rash  . Nitroglycerin Other (See Comments)    Lowers patient's B/P  . Vantin Other (See Comments)    Unknown/not noted on MAR??    Social History   Socioeconomic History  . Marital status: Married    Spouse name: Not on file  . Number of children: 2  . Years of education: Not on file  . Highest education level: Not on file  Occupational History  . Occupation: retired  Tobacco Use  . Smoking status: Never Smoker  . Smokeless tobacco: Never Used  Substance and Sexual Activity  . Alcohol use: No  . Drug use: No  . Sexual activity: Not Currently  Other Topics Concern  . Not on file  Social History Narrative   ** Merged History Encounter **       MUST HAVE MEDS LISTED DO NOT SUBSTITUTE GENERICS PATIENT IS ALLERGIC TO DYES   Social Determinants of Health   Financial Resource Strain:   . Difficulty of Paying Living Expenses:   Food Insecurity:   . Worried About Charity fundraiser in the Last Year:   . Arboriculturist in the Last Year:   Transportation Needs:   . Film/video editor (Medical):   Marland Kitchen Lack of Transportation (Non-Medical):   Physical Activity:   . Days of Exercise per Week:   . Minutes of Exercise per Session:   Stress:   . Feeling of Stress :   Social Connections:   . Frequency of Communication with Friends and Family:   . Frequency of Social Gatherings with Friends and Family:   . Attends Religious Services:   . Active Member of Clubs or Organizations:   . Attends Archivist Meetings:   Marland Kitchen Marital Status:   Intimate Partner Violence:   . Fear of Current or Ex-Partner:   . Emotionally Abused:   Marland Kitchen Physically Abused:   . Sexually Abused:      ROS- All systems are reviewed and negative except as per the HPI above.  Physical Exam: Vitals:   08/10/19 1410  BP: (!) 142/80  Pulse: 61  Weight: 72.8 kg  Height: 5\' 10"  (1.778 m)    GEN- The patient is well appearing elderly male, alert and oriented x 3 today.    Head- normocephalic, atraumatic Eyes-  Sclera clear, conjunctiva pink Ears- hearing intact Oropharynx- clear Neck- supple  Lungs- Clear to ausculation bilaterally, normal work of breathing Heart- Regular rate and rhythm, no murmurs, rubs or gallops  GI- soft, NT, ND, + BS Extremities- no clubbing, cyanosis, or edema MS- no significant deformity or atrophy Skin- no rash or lesion Psych- euthymic mood, full affect Neuro- strength and sensation are intact  Wt Readings from Last 3 Encounters:  08/10/19 72.8 kg  08/05/19 66.2 kg  08/01/19 65.8 kg    EKG today demonstrates SR HR 61, 1st degree AV block, PR 288, QRS 88, QTc 426  Echo 07/12/19 demonstrated  1. Mild concentric LVH with moderate hypertrophy of the basal septum.  Left ventricular ejection fraction, by estimation, is 55 to 60%. The left  ventricle has normal function. The left ventricle has no regional wall  motion abnormalities. There is mild  concentric left ventricular hypertrophy. Left ventricular diastolic  parameters are consistent with Grade II diastolic dysfunction  (pseudonormalization).  2. Right ventricular systolic function is normal. The right ventricular  size is normal. There is mildly elevated pulmonary artery systolic  pressure.  3. Left atrial size was moderately dilated.  4.  The mitral valve is normal in structure. Trivial mitral valve  regurgitation. No evidence of mitral stenosis.  5. The aortic valve is tricuspid. Aortic valve regurgitation is mild. No  aortic stenosis is present.  6. The inferior vena cava is dilated in size with <50% respiratory  variability, suggesting right atrial pressure of 15 mmHg.   Epic records are reviewed at length today  CHA2DS2-VASc Score = 7  The patient's score is based upon: CHF History: 0 HTN History: 1 Age : 2 Diabetes History: 1 Stroke History: 2 Vascular Disease History: 1 Gender: 0      ASSESSMENT AND PLAN: 1. Paroxysmal Atrial Fibrillation  (ICD10:  I48.0) The patient's CHA2DS2-VASc score is 7, indicating a 11.2% annual risk of stroke.   Patient in [place] today. Continue amiodarone 200 mg BID for one week. Then decrease to 200 mg daily. Baseline thyroid and liver function labs reviewed. Recheck on follow up. Continue Eliquis 2.5 mg BID (age, Cr frequently is above 1.5)  2. Secondary Hypercoagulable State (ICD10:  D68.69) The patient is at significant risk for stroke/thromboembolism based upon his CHA2DS2-VASc Score of 7.  Continue Apixaban (Eliquis).   3. CAD S/p STEMI 2019. No anginal symptoms.  4. HTN Stable, no changes today.   Follow up in the AF clinic in one month.    [facility] [address] [place],  [address] [phone] 08/10/2019 2:45 PM

## 2019-08-11 ENCOUNTER — Ambulatory Visit (HOSPITAL_COMMUNITY): Payer: Federal, State, Local not specified - PPO | Admitting: Nurse Practitioner

## 2019-08-17 ENCOUNTER — Other Ambulatory Visit: Payer: Self-pay

## 2019-08-17 ENCOUNTER — Non-Acute Institutional Stay: Payer: Medicare Other | Admitting: Internal Medicine

## 2019-08-17 ENCOUNTER — Encounter: Payer: Self-pay | Admitting: Internal Medicine

## 2019-08-17 DIAGNOSIS — Z515 Encounter for palliative care: Secondary | ICD-10-CM

## 2019-08-17 DIAGNOSIS — Z7189 Other specified counseling: Secondary | ICD-10-CM

## 2019-08-17 NOTE — Progress Notes (Signed)
Aug 5th, 2020 [facility] Palliative Care Consult Note Telephone: [phone]  Fax: [phone]   PATIENT NAME: [name] ("patch") DOB: [birth year] MRN: [id number] [place] [address] (move in date 04/30/2016)   PRIMARY CARE PROVIDER:  Lajean Manes, MD [address] [place] [address]   Dr. Aline [name] ([facility]) [phone]  Dr. Virgina [name] (Cardiology)   REFERRING PROVIDER: [place]: (sister) [facility] [phone]. ([name]) [name] [phone]. Daughter [name] [facility]) patient's HCPOA. [name] will text me her number. Son [name] in [place].   ASSESSMENT / RECOMMENDATIONS:  1. Advance Care Planning:             A. Directives: DNR (present in facility chart; uploaded into [facility]EM[facility])             B. Goals of Care: To continue to maintain his strength (via his various exercise regimens) and participate in facility activities.  Since I last saw [name], he has had an ER visit for a fall d/t loss of balance, an ER visit for A fib with RVR (spontaneously converted to NSR), and most recently a two day stay for mild symptoms suggestive of a transient stroke (w/u reassuring; on Plavix). At that most recent admission he was noted to be in/out a fib. Amiodarone was started. He continues on Cardizem, metoprolol, and anticoagulation with Eliquis. Seems to have maintained NSR since. He has had a f/u visit at the A fib clinic.    2.Cognitive / Functional status:   Patient continues in his usual good spirits, enjoying a BINGO game at the time of my visit. His sister and his little dog (his sister cares for this animal) visits. Patient is a Multimedia programmer. He uses the facility exercise bike at least 3x/week, for  hour each session. He's getting out on his electric scooter doing laps around the building. He's trying to keep his weight stable. He has had a h/o obesity  Patient is  alert and conversant.  His voice is soft, and he's a little hard of hearing.   He denies symptoms of dyspnea or LEE. No bleeding issues. No palpitations suggestive of recurrent a fib. His rate today is regular.    Patient continues to ambulate about in his room without assistive devices, short distances in hall with walker for stability, and longer trips with his electric scooter. His weight has been steady; currently 160lbs. At a height of 5'10" his BMI is 23kg/m2.    3. Family Supports: Spouse (second marriage) deceased a few years ago; unexpectedly d/t flu. He misses her. Sister [name] visits frequently with patient's little dog. I updated her with today's visit. Patient has children from first marriage not directly engaged.  Daughter [name] ([place]) in [place], Son [name] in [place].   4. Follow up Palliative Care Visit: 2-3 months. I called his sister [name] with updates of today's visit. [name] plans to text me patient's daughter's contact number ([name]), later this afternoon for our records.   I spent 30 minutes providing this consultation from 3pm-3:30pm. More than 50% of the time in this consultation was spent coordinating communication.    HISTORY OF PRESENT ILLNESS:  [name] is an 86 y.o. M with hx CVA, prosCa s/p radX, BPH,  Parkinsons disease (with dementia), CAD (STEMI 2019), hypothyroidism, HTN, DM (neuropathy), Afib (Eliquis) and CKD III (baseline creatine 1.3). Hydronephrosis / nephrolithiasis (stent placement. Prior episodes  demand ischemia setting of A fib with RVR. -7/22-7/24/21 hospitalized near syncopal event, slurred speech, facial droop. In A fib with RVR,converts in/out of. Amiodarone started. Continues Cardizem. NSR at time of discharge. A-fib clinic follow ing (OV 08/10/19) -08/01/19: ER A fib with RVR; spontaneously converted to NSR. -07/26/19 ER after fall d/t loss of balance 6/30-07/13/2019: Hospital admission: CP setting of afib with RVR. Treated  with IV Cardizem to oral. Home health recommended. -12/30/2018: ER laceration over L eyebrow after a fall; sutured  -12/22/2018: Hospital admission: R flank pain with hematuria; known R ureteral calculi with stent placed 1 yr ago. He had canceled his stone extraction. On 12/22/2018: R stent placement -11/1-11/02/2018: Hospital admission: treated for Hgb 6.8 (tx 1 unit PRBCs). Afib with RVR (converted to NSR). FOBN. Demand ischemia with elevation Troponin.   This is a f/u Palliative Care visit from July 5th , 2021   CODE STATUS: DNR   PPS: 60%   HOSPICE ELIGIBILITY/DIAGNOSIS: TBD  PAST MEDICAL HISTORY:  Past Medical History:  Diagnosis Date  . Allergic rhinitis   . Arthritis    arthritis-kyphosis  . Asthmatic bronchitis   . CAD in native artery    a. STEMI 07/2017 s/p DES to LCx (25% mRCA and 70% oOM1 residual).  . CKD (chronic kidney disease) stage 3, GFR 30-59 ml/min   . DM type 2 (diabetes mellitus, type 2) ([place])   . DVT, lower extremity, proximal, acute ([place])   . Dyslipidemia   . GERD (gastroesophageal reflux disease)   . Hearing impaired    bilateral hearing aids  . History of kidney stones   . History of skin cancer   . Hypertension   . Hypothyroidism   . Macular degeneration    injections done bilaterally recent - 08-22-13  . Normocytic anemia   . PAF (paroxysmal atrial fibrillation) ([place])   . Parkinson's disease ([place])   . Prostate cancer [facility])    Prostate cancer-radiation only,skin cancer-basal cell and last squamous cell right eye  . Statin intolerance   . Stroke ([place])   . Urgency-frequency syndrome   . Urticaria     SOCIAL HX:  Social History   Tobacco Use  . Smoking status: Never Smoker  . Smokeless tobacco: Never Used  Substance Use Topics  . Alcohol use: No    ALLERGIES:  Allergies  Allergen Reactions  . Colchicine Anaphylaxis  . Hibiclens [Chlorhexidine] Hives  . Atorvastatin Other (See Comments)    Joint pain   . Ceftin Diarrhea  . Celebrex  [Celecoxib] Swelling  . Codeine Hives  . Erythromycin Diarrhea  . Ezetimibe Other (See Comments)    Joint pain   . Iodinated Diagnostic Agents Nausea And Vomiting and Rash  . Oxycodone-Acetaminophen Hives  . Rosuvastatin Other (See Comments)    Joint pain   . Simvastatin Other (See Comments)    Joint pain   . Cefuroxime   . Celebrex [Celecoxib]   . Codeine   . Crestor [Rosuvastatin]   . Erythromycin   . Lipitor [Atorvastatin]   . Nitroglycerin   . Percocet [Oxycodone-Acetaminophen]   . Protonix [Pantoprazole]   . Tetanus Toxoids   . Cefpodoxime Rash  . Cefuroxime Axetil Rash  . Nitroglycerin Other (See Comments)    Lowers patient's B/P  . Vantin Other (See Comments)    Unknown/not noted on MAR??     PERTINENT MEDICATIONS:  Outpatient Encounter Medications as of 08/17/2019  Medication Sig  . alfuzosin (UROXATRAL) 10 MG 24 hr tablet Take 1  tablet (10 mg total) by mouth at bedtime.  . Alogliptin Benzoate 12.5 MG TABS Take 12.5 mg by mouth daily.   Marland Kitchen amiodarone (PACERONE) 200 MG tablet Take 1 tablet (200 mg total) by mouth 2 (two) times daily for 11 days.  Marland Kitchen amiodarone (PACERONE) 200 MG tablet Take 1 tablet (200 mg total) by mouth daily.  Marland Kitchen apixaban (ELIQUIS) 2.5 MG TABS tablet Take 2.5 mg by mouth 2 (two) times daily.  . brimonidine-timolol (COMBIGAN) 0.2-0.5 % ophthalmic solution Place 1 drop into both eyes 2 (two) times daily.  . brinzolamide (AZOPT) 1 % ophthalmic suspension Place 1 drop into the right eye 3 (three) times daily.   . carbidopa-levodopa (SINEMET IR) 25-100 MG tablet Take 1 tablet by mouth 3 (three) times daily.   . Carboxymethylcellulose Sodium (ARTIFICIAL TEARS OP) Place 1 drop into both eyes 4 (four) times daily.  . Cholecalciferol (VITAMIN D-3) 5000 units TABS Take 5,000 Units by mouth daily.  . clopidogrel (PLAVIX) 75 MG tablet Take 75 mg by mouth daily.   . colesevelam (WELCHOL) 625 MG tablet Take 1,875 mg by mouth every evening.   . Cyanocobalamin  (VITAMIN B-12) 2500 MCG SUBL Place 1 tablet under the tongue daily.  Marland Kitchen diltiazem (CARDIZEM CD) 120 MG 24 hr capsule Take 1 capsule (120 mg total) by mouth daily for 30 days.  Marland Kitchen docusate sodium (COLACE) 100 MG capsule Take 100 mg by mouth 2 (two) times daily.   . ferrous sulfate 325 (65 FE) MG EC tablet Take 1 tablet (325 mg total) by mouth 2 (two) times daily with a meal.  . fluticasone (FLONASE) 50 MCG/ACT nasal spray Place 2 sprays into both nostrils daily.  Marland Kitchen latanoprost (XALATAN) 0.005 % ophthalmic solution Place 1 drop into the right eye at bedtime.   Marland Kitchen levocetirizine (XYZAL) 5 MG tablet Take 2.5 mg by mouth at bedtime.   Marland Kitchen levothyroxine (SYNTHROID) 150 MCG tablet Take 150 mcg by mouth daily.   . Melatonin 3 MG SUBL Place 3 mg under the tongue at bedtime.  . Meth-Hyo-M Bl-Na Phos-Ph Sal (URO-MP) 118 MG CAPS Take 1 capsule by mouth 3 (three) times daily.  . metoprolol succinate (TOPROL-XL) 25 MG 24 hr tablet Take 1 tablet (25 mg total) by mouth daily.  . mirabegron ER (MYRBETRIQ) 50 MG TB24 tablet Take 1 tablet (50 mg total) by mouth daily.  . Multiple Vitamins-Minerals (PRESERVISION/LUTEIN) CAPS Take 1 capsule by mouth 2 (two) times daily.  . pantoprazole (PROTONIX) 40 MG tablet Take 40 mg by mouth daily.  . polyethylene glycol (MIRALAX / GLYCOLAX) packet Take 17 g by mouth daily. Stoy  . sertraline (ZOLOFT) 25 MG tablet Take 25 mg by mouth daily.  . traMADol (ULTRAM) 50 MG tablet Take 1 tablet (50 mg total) by mouth every 6 (six) hours as needed. (Patient taking differently: Take 50 mg by mouth every 6 (six) hours as needed for moderate pain. )   No facility-administered encounter medications on file as of 08/17/2019.    GENERAL EXAM:   Pleasantly conversant, A & O. Involved in a facility [place] game Cardiovascular: regular rate and rhythm Pulmonary: clear ant fields Abdomen: soft, nontender, + bowel sounds Extremities: no edema, no joint deformities Skin: no  rashes Neurological:  Non-focal  Julianne Handler, NP  [phone]

## 2019-09-11 ENCOUNTER — Ambulatory Visit (HOSPITAL_COMMUNITY)
Admission: RE | Admit: 2019-09-11 | Discharge: 2019-09-11 | Disposition: A | Discharge status: Home / Self Care | Payer: Medicare Other | Source: Ambulatory Visit | Attending: Physician Assistant | Admitting: Physician Assistant

## 2019-09-11 ENCOUNTER — Encounter (HOSPITAL_COMMUNITY): Payer: Self-pay | Admitting: Physician Assistant

## 2019-09-11 ENCOUNTER — Other Ambulatory Visit: Payer: Self-pay

## 2019-09-11 VITALS — BP 148/80 | HR 59 | Ht 70.0 in | Wt 162.8 lb

## 2019-09-11 DIAGNOSIS — E1122 Type 2 diabetes mellitus with diabetic chronic kidney disease: Secondary | ICD-10-CM | POA: Diagnosis not present

## 2019-09-11 DIAGNOSIS — E785 Hyperlipidemia, unspecified: Secondary | ICD-10-CM | POA: Insufficient documentation

## 2019-09-11 DIAGNOSIS — Z888 Allergy status to other drugs, medicaments and biological substances status: Secondary | ICD-10-CM | POA: Insufficient documentation

## 2019-09-11 DIAGNOSIS — I252 Old myocardial infarction: Secondary | ICD-10-CM | POA: Insufficient documentation

## 2019-09-11 DIAGNOSIS — E039 Hypothyroidism, unspecified: Secondary | ICD-10-CM | POA: Diagnosis not present

## 2019-09-11 DIAGNOSIS — Z823 Family history of stroke: Secondary | ICD-10-CM | POA: Insufficient documentation

## 2019-09-11 DIAGNOSIS — Z8673 Personal history of transient ischemic attack (TIA), and cerebral infarction without residual deficits: Secondary | ICD-10-CM | POA: Diagnosis not present

## 2019-09-11 DIAGNOSIS — Z96653 Presence of artificial knee joint, bilateral: Secondary | ICD-10-CM | POA: Diagnosis not present

## 2019-09-11 DIAGNOSIS — Z7902 Long term (current) use of antithrombotics/antiplatelets: Secondary | ICD-10-CM | POA: Insufficient documentation

## 2019-09-11 DIAGNOSIS — Z8546 Personal history of malignant neoplasm of prostate: Secondary | ICD-10-CM | POA: Diagnosis not present

## 2019-09-11 DIAGNOSIS — Z79899 Other long term (current) drug therapy: Secondary | ICD-10-CM | POA: Insufficient documentation

## 2019-09-11 DIAGNOSIS — Z79890 Hormone replacement therapy (postmenopausal): Secondary | ICD-10-CM | POA: Diagnosis not present

## 2019-09-11 DIAGNOSIS — I480 Paroxysmal atrial fibrillation: Secondary | ICD-10-CM | POA: Diagnosis not present

## 2019-09-11 DIAGNOSIS — Z955 Presence of coronary angioplasty implant and graft: Secondary | ICD-10-CM | POA: Diagnosis not present

## 2019-09-11 DIAGNOSIS — Z7901 Long term (current) use of anticoagulants: Secondary | ICD-10-CM | POA: Diagnosis not present

## 2019-09-11 DIAGNOSIS — I2510 Atherosclerotic heart disease of native coronary artery without angina pectoris: Secondary | ICD-10-CM | POA: Insufficient documentation

## 2019-09-11 DIAGNOSIS — K219 Gastro-esophageal reflux disease without esophagitis: Secondary | ICD-10-CM | POA: Insufficient documentation

## 2019-09-11 DIAGNOSIS — I129 Hypertensive chronic kidney disease with stage 1 through stage 4 chronic kidney disease, or unspecified chronic kidney disease: Secondary | ICD-10-CM | POA: Diagnosis not present

## 2019-09-11 DIAGNOSIS — N183 Chronic kidney disease, stage 3 unspecified: Secondary | ICD-10-CM | POA: Diagnosis not present

## 2019-09-11 DIAGNOSIS — Z960 Presence of urogenital implants: Secondary | ICD-10-CM | POA: Insufficient documentation

## 2019-09-11 DIAGNOSIS — D6869 Other thrombophilia: Secondary | ICD-10-CM | POA: Diagnosis not present

## 2019-09-11 DIAGNOSIS — Z8249 Family history of ischemic heart disease and other diseases of the circulatory system: Secondary | ICD-10-CM | POA: Insufficient documentation

## 2019-09-11 DIAGNOSIS — Z923 Personal history of irradiation: Secondary | ICD-10-CM | POA: Insufficient documentation

## 2019-09-11 LAB — COMPREHENSIVE METABOLIC PANEL
ALT: 6 U/L (ref 0–44)
AST: 8 U/L — ABNORMAL LOW (ref 15–41)
Albumin: 3.6 g/dL (ref 3.5–5.0)
Alkaline Phosphatase: 81 U/L (ref 38–126)
Anion gap: 10 (ref 5–15)
BUN: 30 mg/dL — ABNORMAL HIGH (ref 8–23)
CO2: 25 mmol/L (ref 22–32)
Calcium: 8.6 mg/dL — ABNORMAL LOW (ref 8.9–10.3)
Chloride: 104 mmol/L (ref 98–111)
Creatinine, Ser: 1.92 mg/dL — ABNORMAL HIGH (ref 0.61–1.24)
GFR calc Af Amer: 36 mL/min — ABNORMAL LOW (ref >60)
GFR calc non Af Amer: 31 mL/min — ABNORMAL LOW (ref >60)
Glucose, Bld: 124 mg/dL — ABNORMAL HIGH (ref 70–99)
Potassium: 4.5 mmol/L (ref 3.5–5.1)
Sodium: 139 mmol/L (ref 135–145)
Total Bilirubin: 0.5 mg/dL (ref 0.3–1.2)
Total Protein: 6.1 g/dL — ABNORMAL LOW (ref 6.5–8.1)

## 2019-09-11 LAB — TSH: TSH: 1.704 u[IU]/mL (ref 0.350–4.500)

## 2019-09-11 NOTE — Progress Notes (Signed)
Primary Care Physician: Patient, No Pcp Per Primary Cardiologist: Dr Radford Pax Primary Electrophysiologist: none Referring Physician: [facility]   [name] is a 86 y.o. male with a history of CKD stage III, DM type II, DVTs, GERD, hypothyroidism, hypertension, paroxysmal A. fib, prostate cancer who presents for follow up in the [facility]. Patient is on Eliquis for a CHADS2VASC score of 7. Patient has been to the ED several times recently for afib with RVR. Most recently he was discharged on 08/05/19 after being admitted for near-syncope and found to be in afib with RVR. He was loaded on amiodarone for rhythm control. He converted to SR on the medication.   On follow up today, patient reports that he has done well since his last visit. He has not had any symptoms of afib or any syncopal events. He is tolerating the medication without difficulty. He remains active by exercising on a stationary bike 3 times per week. He denies any bleeding issues on anticoagulation.   Today, he denies symptoms of palpitations, chest pain, shortness of breath, orthopnea, PND, lower extremity edema, dizziness, presyncope, syncope, snoring, daytime somnolence, bleeding, or neurologic sequela. The patient is tolerating medications without difficulties and is otherwise without complaint today.    Atrial Fibrillation Risk Factors:  he does not have symptoms or diagnosis of sleep apnea. he does not have a history of rheumatic fever. he does not have a history of alcohol use.   he has a BMI of Body mass index is 23.36 kg/m.Marland Kitchen Filed Weights   09/11/19 1354  Weight: 73.8 kg    Family History  Problem Relation Age of Onset   Other Sister        ophth disease   Heart attack Father        x7   Heart disease Father        MI x7   Hypertension Mother    Stroke Mother 60   Amblyopia Neg Hx    Blindness Neg Hx    Glaucoma Neg Hx    Macular degeneration Neg Hx     Retinal detachment Neg Hx    Strabismus Neg Hx    Retinitis pigmentosa Neg Hx    Cataracts Neg Hx      Atrial Fibrillation Management history:  Previous antiarrhythmic drugs: amiodarone Previous cardioversions: none Previous ablations: none CHADS2VASC score: 7 Anticoagulation history: Eliquis    Past Medical History:  Diagnosis Date   Allergic rhinitis    Arthritis    arthritis-kyphosis   Asthmatic bronchitis    CAD in native artery    a. STEMI 07/2017 s/p DES to LCx (25% mRCA and 70% oOM1 residual).   CKD (chronic kidney disease) stage 3, GFR 30-59 ml/min    DM type 2 (diabetes mellitus, type 2) (HCC)    DVT, lower extremity, proximal, acute (HCC)    Dyslipidemia    GERD (gastroesophageal reflux disease)    Hearing impaired    bilateral hearing aids   History of kidney stones    History of skin cancer    Hypertension    Hypothyroidism    Macular degeneration    injections done bilaterally recent - 08-22-13   Normocytic anemia    PAF (paroxysmal atrial fibrillation) (HCC)    Parkinson's disease ([place])    Prostate cancer ([place])    Prostate cancer-radiation only,skin cancer-basal cell and last squamous cell right eye   Statin intolerance    Stroke ([place])  Urgency-frequency syndrome    Urticaria    Past Surgical History:  Procedure Laterality Date   BACK SURGERY     lumbar fracture   cataract surgery Bilateral    COLONOSCOPY W/ POLYPECTOMY     x2    CORONARY STENT INTERVENTION N/A 08/06/2017   Procedure: CORONARY STENT INTERVENTION;  Surgeon: Jettie Booze, MD;  Location: [facility];  Service: Cardiovascular;  Laterality: N/A;   CYSTOSCOPY W/ URETERAL STENT PLACEMENT Right 08/13/2017   Procedure: CYSTOSCOPY WITH RIGHT  RETROGRADE PYELOGRAM/URETERAL STENT PLACEMENT;  Surgeon: Raynelle Bring, MD;  [facility];  Service: Urology;  Laterality: Right;   CYSTOSCOPY WITH INJECTION N/A 09/11/2013   Procedure: CYSTOSCOPY WITH  BOTOX INJECTION INTO THE BLADDER;  Surgeon: Ailene Rud, MD;  [facility];  Service: Urology;  Laterality: N/A;   CYSTOSCOPY WITH RETROGRADE PYELOGRAM, URETEROSCOPY AND STENT PLACEMENT     CYSTOSCOPY WITH RETROGRADE PYELOGRAM, URETEROSCOPY AND STENT PLACEMENT Right 12/22/2018   Procedure: CYSTOSCOPY WITH RETROGRADE PYELOGRAM, URETEROSCOPY AND STENT PLACEMENT;  Surgeon: Cleon Gustin, MD;  [facility];  Service: Urology;  Laterality: Right;  90 MINS   CYSTOSCOPY WITH STENT PLACEMENT Right 01/26/2016   Procedure: CYSTOSCOPY, RIGHT RETROGRADE WITH RIGHT URETERAL STENT PLACEMENT;  Surgeon: Cleon Gustin, MD;  [facility];  Service: Urology;  Laterality: Right;   CYSTOSCOPY/RETROGRADE/URETEROSCOPY Right 04/22/2016   Procedure: CYSTOSCOPY/RETROGRADE/ REMOVAL JJ STENT right insertion stent right insertion of foley;  Surgeon: Franchot Gallo, MD;  [facility];  Service: Urology;  Laterality: Right;   EXTRACORPOREAL SHOCK WAVE LITHOTRIPSY Right 02/27/2016   Procedure: RIGHT EXTRACORPOREAL SHOCK WAVE LITHOTRIPSY (ESWL) GAITED;  Surgeon: Cleon Gustin, MD;  [facility];  Service: Urology;  Laterality: Right;   EXTRACORPOREAL SHOCK WAVE LITHOTRIPSY Right 02/10/2016   Procedure: EXTRACORPOREAL SHOCK WAVE LITHOTRIPSY (ESWL);  Surgeon: Kathie Rhodes, MD;  [facility];  Service: Urology;  Laterality: Right;  [employer] [id number] [place] [employer]- [id number]    EYE SURGERY     macular degeneration   GALLBLADDER SURGERY  2006   no cholecystectomy   HERNIA REPAIR     HOLMIUM LASER APPLICATION Right 86/76/7209   Procedure: HOLMIUM LASER APPLICATION;  Surgeon: Cleon Gustin, MD;  [facility];  Service: Urology;  Laterality: Right;   LEFT HEART CATH AND CORONARY ANGIOGRAPHY N/A 08/06/2017   Procedure: LEFT HEART CATH AND CORONARY ANGIOGRAPHY;  Surgeon: Jettie Booze, MD;  Location: [facility];  Service:  Cardiovascular;  Laterality: N/A;   LUNG REMOVAL, PARTIAL  age 69   for bronchiectasis   PARTIAL THYMECTOMY  1985   for nodules   POPLITEAL SYNOVIAL CYST EXCISION  2005   TOTAL KNEE ARTHROPLASTY  2008   right   TOTAL KNEE ARTHROPLASTY  2010   left    Current Outpatient Medications  Medication Sig Dispense Refill   ACCU-CHEK GUIDE test strip      alfuzosin (UROXATRAL) 10 MG 24 hr tablet Take 1 tablet (10 mg total) by mouth at bedtime. 30 tablet 0   Alogliptin Benzoate 12.5 MG TABS Take 12.5 mg by mouth daily.      amiodarone (PACERONE) 200 MG tablet Take 1 tablet (200 mg total) by mouth daily.     apixaban (ELIQUIS) 2.5 MG TABS tablet Take 2.5 mg by mouth 2 (two) times daily.     brimonidine-timolol (COMBIGAN) 0.2-0.5 % ophthalmic solution Place 1 drop into both eyes 2 (two) times daily.     brinzolamide (  AZOPT) 1 % ophthalmic suspension Place 1 drop into the right eye 3 (three) times daily.      carbidopa-levodopa (SINEMET IR) 25-100 MG tablet Take 1 tablet by mouth 3 (three) times daily.      Carboxymethylcellulose Sodium (ARTIFICIAL TEARS OP) Place 1 drop into both eyes 4 (four) times daily.     Cholecalciferol (VITAMIN D-3) 5000 units TABS Take 5,000 Units by mouth daily.     clopidogrel (PLAVIX) 75 MG tablet Take 75 mg by mouth daily.      colesevelam (WELCHOL) 625 MG tablet Take 1,875 mg by mouth every evening.      Cyanocobalamin (VITAMIN B-12) 2500 MCG SUBL Place 1 tablet under the tongue daily.     diltiazem (CARDIZEM CD) 120 MG 24 hr capsule Take 1 capsule (120 mg total) by mouth daily for 30 days. 30 capsule 0   docusate sodium (COLACE) 100 MG capsule Take 100 mg by mouth 2 (two) times daily.      ferrous sulfate 325 (65 FE) MG EC tablet Take 1 tablet (325 mg total) by mouth 2 (two) times daily with a meal. 60 tablet 11   fluticasone (FLONASE) 50 MCG/ACT nasal spray Place 2 sprays into both nostrils daily.     latanoprost (XALATAN) 0.005 % ophthalmic  solution Place 1 drop into the right eye at bedtime.      levocetirizine (XYZAL) 5 MG tablet Take 2.5 mg by mouth at bedtime.      levothyroxine (SYNTHROID) 150 MCG tablet Take 150 mcg by mouth daily.      Melatonin 3 MG SUBL Place 3 mg under the tongue at bedtime.     Meth-Hyo-M Bl-Na Phos-Ph Sal (URO-MP) 118 MG CAPS Take 1 capsule by mouth 3 (three) times daily.     metoprolol succinate (TOPROL-XL) 25 MG 24 hr tablet Take 1 tablet (25 mg total) by mouth daily.     mirabegron ER (MYRBETRIQ) 50 MG TB24 tablet Take 1 tablet (50 mg total) by mouth daily. 30 tablet 0   Multiple Vitamins-Minerals (PRESERVISION/LUTEIN) CAPS Take 1 capsule by mouth 2 (two) times daily.     pantoprazole (PROTONIX) 40 MG tablet Take 40 mg by mouth daily.     polyethylene glycol (MIRALAX / GLYCOLAX) packet Take 17 g by mouth daily. MIX AND DRINK     sertraline (ZOLOFT) 25 MG tablet Take 25 mg by mouth daily.     traMADol (ULTRAM) 50 MG tablet Take 1 tablet (50 mg total) by mouth every 6 (six) hours as needed. (Patient taking differently: Take 50 mg by mouth every 6 (six) hours as needed for moderate pain. ) 30 tablet 0   amiodarone (PACERONE) 200 MG tablet Take 1 tablet (200 mg total) by mouth 2 (two) times daily for 11 days. 22 tablet 0   No current facility-administered medications for this encounter.    Allergies  Allergen Reactions   Colchicine Anaphylaxis   Hibiclens [Chlorhexidine] Hives   Atorvastatin Other (See Comments)    Joint pain    Ceftin Diarrhea   Celebrex [Celecoxib] Swelling   Codeine Hives   Erythromycin Diarrhea   Ezetimibe Other (See Comments)    Joint pain    Iodinated Diagnostic Agents Nausea And Vomiting and Rash   Oxycodone-Acetaminophen Hives   Rosuvastatin Other (See Comments)    Joint pain    Simvastatin Other (See Comments)    Joint pain    Cefuroxime    Celebrex [Celecoxib]    Codeine    Crestor [  Rosuvastatin]    Erythromycin    Lipitor  [Atorvastatin]    Nitroglycerin    Percocet [Oxycodone-Acetaminophen]    Protonix [Pantoprazole]    Tetanus Toxoids    Cefpodoxime Rash   Cefuroxime Axetil Rash   Nitroglycerin Other (See Comments)    Lowers patient's B/P   Vantin Other (See Comments)    Unknown/not noted on MAR??    Social History   Socioeconomic History   Marital status: Married    Spouse name: Not on file   Number of children: 2   Years of education: Not on file   Highest education level: Not on file  Occupational History   Occupation: retired  Tobacco Use   Smoking status: Never Smoker   Smokeless tobacco: Never Used  Substance and Sexual Activity   Alcohol use: No   Drug use: No   Sexual activity: Not Currently  Other Topics Concern   Not on file  Social History Narrative   ** Merged History Encounter **       MUST HAVE MEDS LISTED DO NOT SUBSTITUTE GENERICS PATIENT IS ALLERGIC TO DYES   Social Determinants of Health   Financial Resource Strain:    Difficulty of Paying Living Expenses: Not on file  Food Insecurity:    Worried About Charity fundraiser in the Last Year: Not on file   [employer] of Food in the Last Year: Not on file  Transportation Needs:    Lack of Transportation (Medical): Not on file   Lack of Transportation (Non-Medical): Not on file  Physical Activity:    Days of Exercise per Week: Not on file   Minutes of Exercise per Session: Not on file  Stress:    Feeling of Stress : Not on file  Social Connections:    Frequency of Communication with Friends and Family: Not on file   Frequency of Social Gatherings with Friends and Family: Not on file   Attends Religious Services: Not on file   Active Member of Clubs or Organizations: Not on file   Attends Archivist Meetings: Not on file   Marital Status: Not on file  Intimate Partner Violence:    Fear of Current or Ex-Partner: Not on file   Emotionally Abused: Not on file    Physically Abused: Not on file   Sexually Abused: Not on file     ROS- All systems are reviewed and negative except as per the HPI above.  Physical Exam: Vitals:   09/11/19 1354  BP: (!) 148/80  Pulse: (!) 59  Weight: 73.8 kg  Height: 5\' 10"  (1.778 m)    GEN- The patient is well appearing elderly male, alert and oriented x 3 today.   HEENT-head normocephalic, atraumatic, sclera clear, conjunctiva pink, hearing intact, trachea midline. Lungs- Clear to ausculation bilaterally, normal work of breathing Heart- Regular rate and rhythm, no murmurs, rubs or gallops  GI- soft, NT, ND, + BS Extremities- no clubbing, cyanosis, or edema MS- no significant deformity or atrophy Skin- no rash or lesion Psych- euthymic mood, full affect Neuro- strength and sensation are intact   Wt Readings from Last 3 Encounters:  09/11/19 73.8 kg  08/10/19 72.8 kg  08/05/19 66.2 kg    EKG today demonstrates SB HR 59, 1st degree AV block, PR 262, QRS 90, QTc 427  Echo 07/12/19 demonstrated  1. Mild concentric LVH with moderate hypertrophy of the basal septum.  Left ventricular ejection fraction, by estimation, is 55 to 60%. The left  ventricle has normal function. The left ventricle has no regional wall  motion abnormalities. There is mild  concentric left ventricular hypertrophy. Left ventricular diastolic  parameters are consistent with Grade II diastolic dysfunction  (pseudonormalization).  2. Right ventricular systolic function is normal. The right ventricular  size is normal. There is mildly elevated pulmonary artery systolic  pressure.  3. Left atrial size was moderately dilated.  4. The mitral valve is normal in structure. Trivial mitral valve  regurgitation. No evidence of mitral stenosis.  5. The aortic valve is tricuspid. Aortic valve regurgitation is mild. No  aortic stenosis is present.  6. The inferior vena cava is dilated in size with <50% respiratory  variability, suggesting  right atrial pressure of 15 mmHg.   Epic records are reviewed at length today  CHA2DS2-VASc Score = 7  The patient's score is based upon: CHF History: 0 HTN History: 1 Age : 2 Diabetes History: 1 Stroke History: 2 Vascular Disease History: 1 Gender: 0      ASSESSMENT AND PLAN: 1. Paroxysmal Atrial Fibrillation (ICD10:  I48.0) The patient's CHA2DS2-VASc score is 7, indicating a 11.2% annual risk of stroke.   Patient appears to be maintaining SR. Continue amiodarone 200 mg daily. Check cmet/TSH Continue Eliquis 2.5 mg BID (age, Cr frequently above 1.5)  2. Secondary Hypercoagulable State (ICD10:  D68.69) The patient is at significant risk for stroke/thromboembolism based upon his CHA2DS2-VASc Score of 7.  Continue Apixaban (Eliquis).   3. CAD S/p STEMI 2019. No anginal symptoms.  4. HTN Stable, no changes today.   Follow up with Dr Radford Pax in 3 months. AF clinic in 6 months.   [facility] [address] [place], [place] [address] [phone] 09/11/2019 2:30 PM

## 2019-09-14 ENCOUNTER — Ambulatory Visit (INDEPENDENT_AMBULATORY_CARE_PROVIDER_SITE_OTHER): Payer: Medicare Other | Admitting: Physician Assistant

## 2019-09-14 ENCOUNTER — Encounter: Payer: Self-pay | Admitting: Orthopedic Surgery

## 2019-09-14 ENCOUNTER — Ambulatory Visit: Payer: Self-pay

## 2019-09-14 VITALS — Ht 70.0 in | Wt 162.0 lb

## 2019-09-14 DIAGNOSIS — M25562 Pain in left knee: Secondary | ICD-10-CM | POA: Diagnosis not present

## 2019-09-14 NOTE — Progress Notes (Signed)
Office Visit Note   Patient: [name]           Date of Birth: 84-19-1933           MRN: [id number] Visit Date: 09/14/2019              Requested by: No referring provider defined for this encounter. PCP: Patient, No Pcp Per  No chief complaint on file.     HPI: This is a pleasant gentleman who is several years status post bilateral knee replacements done by Dr. Sharol Given.  He is residing at a senior center.  He has had some difficulties with his left knee.  He says it feels "heavy to him.  Most of his pain is just above his kneecap at the insertion of the quadriceps tendon  Assessment & Plan: Visit Diagnoses:  1. Left knee pain, unspecified chronicity     Plan: Have given an order for him to work on [employer] strengthening at his facility with PT.  Follow-up as needed  Follow-Up Instructions: No follow-ups on file.   Ortho Exam  Patient is alert, oriented, no adenopathy, well-dressed, normal affect, normal respiratory effort. Bilateral knees no cellulitis no swelling well-healed surgical incisions.  Focused examination of the left knee he has good extension and flexion.  He does have quad activation though it is somewhat weak.    Imaging: No results found. No images are attached to the encounter.  Labs: Lab Results  Component Value Date   HGBA1C 5.7 (H) 12/22/2018   HGBA1C 5.6 12/22/2018   HGBA1C 6.3 (H) 08/04/2017   REPTSTATUS 08/03/2019 FINAL 08/01/2019   CULT 30,000 COLONIES/mL STAPHYLOCOCCUS EPIDERMIDIS (A) 08/01/2019   LABORGA STAPHYLOCOCCUS EPIDERMIDIS (A) 08/01/2019     Lab Results  Component Value Date   ALBUMIN 3.6 09/11/2019   ALBUMIN 3.3 (L) 08/03/2019   ALBUMIN 3.3 (L) 08/01/2019    Lab Results  Component Value Date   MG 2.0 08/05/2019   MG 2.1 07/12/2019   MG 2.1 11/13/2018   No results found for: VD25OH  No results found for: PREALBUMIN CBC EXTENDED Latest Ref Rng & Units 08/05/2019 08/04/2019 08/03/2019  WBC 4.0 - 10.5 K/uL 4.9 5.0 -  RBC  4.22 - 5.81 MIL/uL 4.08(L) 3.78(L) -  HGB 13.0 - 17.0 g/dL 12.4(L) 11.6(L) 12.2(L)  HCT 39 - 52 % 38.3(L) 36.1(L) 36.0(L)  PLT 150 - 400 K/uL 173 179 -  NEUTROABS 1.7 - 7.7 K/uL 2.6 - -  LYMPHSABS 0.7 - 4.0 K/uL 1.4 - -     Body mass index is 23.24 kg/m.  Orders:  Orders Placed This Encounter  Procedures  . XR Knee 1-2 Views Left   No orders of the defined types were placed in this encounter.    Procedures: No procedures performed  Clinical Data: No additional findings.  ROS:  All other systems negative, except as noted in the HPI. Review of Systems  Objective: Vital Signs: Ht 5\' 10"  (1.778 m)   Wt 162 lb (73.5 kg)   BMI 23.24 kg/m   Specialty Comments:  No specialty comments available.  PMFS History: Patient Active Problem List   Diagnosis Date Noted  . Secondary hypercoagulable state ([place]) 08/10/2019  . Symptomatic anemia 11/13/2018  . Parkinson's disease ([place]) 11/13/2018  . Paroxysmal atrial fibrillation (HCC)   . Coronary artery disease involving native coronary artery of native heart without angina pectoris   . Hyperlipidemia LDL goal <70   . Urinary tract infection with hematuria   .  Non-ST elevation (NSTEMI) myocardial infarction ([place])   . GERD (gastroesophageal reflux disease) 08/04/2017  . Elevated troponin 08/03/2017  . Chest pain 08/03/2017  . CKD (chronic kidney disease), stage III 08/03/2017  . Type II diabetes mellitus with renal manifestations ([place]) 08/03/2017  . Pseudophakia of both eyes 05/31/2017  . Retained ureteral stent 04/22/2016  . Ureteral calculus 01/25/2016  . Benign essential HTN 10/24/2015  . CVA (cerebral vascular accident) ([place]) 10/14/2014  . TIA (transient ischemic attack)   . CME (cystoid macular edema), left 08/10/2013  . Squamous cell carcinoma of eyelid 05/15/2013  . Blepharitis 03/16/2013  . Posterior vitreous detachment of right eye 10/23/2011  . Exudative age-related macular degeneration ([place]) 07/28/2011  .  Nonproliferative diabetic retinopathy ([place]) 04/07/2011  . Insomnia, unspecified 05/19/2010  . ATAXIA 03/17/2010  . DIZZINESS 03/06/2010  . Chronic constipation 02/21/2008  . OSTEOARTHRITIS, KNEE, SEVERE 01/12/2008  . Hypothyroidism (post surgical) 10/15/2006  . Diabetes mellitus, insulin dependent (IDDM), controlled 04/22/2006  . URTICARIA 04/22/2006  . POPLITEAL CYST 04/22/2006  . THYROIDECTOMY, SUBTOTAL, HX OF 04/22/2006  . TOTAL KNEE REPLACEMENT, HX OF 04/22/2006   Past Medical History:  Diagnosis Date  . Allergic rhinitis   . Arthritis    arthritis-kyphosis  . Asthmatic bronchitis   . CAD in native artery    a. STEMI 07/2017 s/p DES to LCx (25% mRCA and 70% oOM1 residual).  . CKD (chronic kidney disease) stage 3, GFR 30-59 ml/min   . DM type 2 (diabetes mellitus, type 2) ([place])   . DVT, lower extremity, proximal, acute ([place])   . Dyslipidemia   . GERD (gastroesophageal reflux disease)   . Hearing impaired    bilateral hearing aids  . History of kidney stones   . History of skin cancer   . Hypertension   . Hypothyroidism   . Macular degeneration    injections done bilaterally recent - 08-22-13  . Normocytic anemia   . PAF (paroxysmal atrial fibrillation) ([place])   . Parkinson's disease ([place])   . Prostate cancer [facility])    Prostate cancer-radiation only,skin cancer-basal cell and last squamous cell right eye  . Statin intolerance   . Stroke ([place])   . Urgency-frequency syndrome   . Urticaria     Family History  Problem Relation Age of Onset  . Other Sister        ophth disease  . Heart attack Father        x7  . Heart disease Father        MI x7  . Hypertension Mother   . Stroke Mother 105  . Amblyopia Neg Hx   . Blindness Neg Hx   . Glaucoma Neg Hx   . Macular degeneration Neg Hx   . Retinal detachment Neg Hx   . Strabismus Neg Hx   . Retinitis pigmentosa Neg Hx   . Cataracts Neg Hx     Past Surgical History:  Procedure Laterality Date  . BACK SURGERY      lumbar fracture  . cataract surgery Bilateral   . COLONOSCOPY W/ POLYPECTOMY     x2   . CORONARY STENT INTERVENTION N/A 08/06/2017   Procedure: CORONARY STENT INTERVENTION;  Surgeon: Jettie Booze, MD;  Location: [facility];  Service: Cardiovascular;  Laterality: N/A;  . CYSTOSCOPY W/ URETERAL STENT PLACEMENT Right 08/13/2017   Procedure: CYSTOSCOPY WITH RIGHT  RETROGRADE PYELOGRAM/URETERAL STENT PLACEMENT;  Surgeon: Raynelle Bring, MD;  [facility];  Service: Urology;  Laterality: Right;  .  CYSTOSCOPY WITH INJECTION N/A 09/11/2013   Procedure: CYSTOSCOPY WITH BOTOX INJECTION INTO THE BLADDER;  Surgeon: Ailene Rud, MD;  [facility];  Service: Urology;  Laterality: N/A;  . CYSTOSCOPY WITH RETROGRADE PYELOGRAM, URETEROSCOPY AND STENT PLACEMENT    . CYSTOSCOPY WITH RETROGRADE PYELOGRAM, URETEROSCOPY AND STENT PLACEMENT Right 12/22/2018   Procedure: CYSTOSCOPY WITH RETROGRADE PYELOGRAM, URETEROSCOPY AND STENT PLACEMENT;  Surgeon: Cleon Gustin, MD;  [facility];  Service: Urology;  Laterality: Right;  90 MINS  . CYSTOSCOPY WITH STENT PLACEMENT Right 01/26/2016   Procedure: CYSTOSCOPY, RIGHT RETROGRADE WITH RIGHT URETERAL STENT PLACEMENT;  Surgeon: Cleon Gustin, MD;  [facility];  Service: Urology;  Laterality: Right;  . CYSTOSCOPY/RETROGRADE/URETEROSCOPY Right 04/22/2016   Procedure: CYSTOSCOPY/RETROGRADE/ REMOVAL JJ STENT right insertion stent right insertion of foley;  Surgeon: Franchot Gallo, MD;  [facility];  Service: Urology;  Laterality: Right;  . EXTRACORPOREAL SHOCK WAVE LITHOTRIPSY Right 02/27/2016   Procedure: RIGHT EXTRACORPOREAL SHOCK WAVE LITHOTRIPSY (ESWL) GAITED;  Surgeon: Cleon Gustin, MD;  [facility];  Service: Urology;  Laterality: Right;  . EXTRACORPOREAL SHOCK WAVE LITHOTRIPSY Right 02/10/2016   Procedure: EXTRACORPOREAL SHOCK WAVE LITHOTRIPSY (ESWL);  Surgeon: Kathie Rhodes, MD;  [facility];  Service:  Urology;  Laterality: Right;  [phone] [id number]- [id number]   . EYE SURGERY     macular degeneration  . GALLBLADDER SURGERY  2006   no cholecystectomy  . HERNIA REPAIR    . HOLMIUM LASER APPLICATION Right 76/73/4193   Procedure: HOLMIUM LASER APPLICATION;  Surgeon: Cleon Gustin, MD;  [facility];  Service: Urology;  Laterality: Right;  . LEFT HEART CATH AND CORONARY ANGIOGRAPHY N/A 08/06/2017   Procedure: LEFT HEART CATH AND CORONARY ANGIOGRAPHY;  Surgeon: Jettie Booze, MD;  Location: [facility];  Service: Cardiovascular;  Laterality: N/A;  . LUNG REMOVAL, PARTIAL  age 88   for bronchiectasis  . PARTIAL THYMECTOMY  1985   for nodules  . POPLITEAL SYNOVIAL CYST EXCISION  2005  . TOTAL KNEE ARTHROPLASTY  2008   right  . TOTAL KNEE ARTHROPLASTY  2010   left   Social History   Occupational History  . Occupation: retired  Tobacco Use  . Smoking status: Never Smoker  . Smokeless tobacco: Never Used  Substance and Sexual Activity  . Alcohol use: No  . Drug use: No  . Sexual activity: Not Currently

## 2019-09-19 ENCOUNTER — Telehealth: Payer: Self-pay | Admitting: Orthopedic Surgery

## 2019-09-19 NOTE — Telephone Encounter (Signed)
Flo from [facility] called.   She wanted to inform us that the patient refused their services.   Call back: [phone]

## 2019-09-19 NOTE — Telephone Encounter (Signed)
Noted Dr. Sharol Given aware.

## 2019-09-27 ENCOUNTER — Telehealth: Payer: Self-pay | Admitting: Orthopedic Surgery

## 2019-09-27 NOTE — Telephone Encounter (Signed)
Cindy from [email or web address]  called requesting a call back from Dr. Sharol Given or nurse. Jenny Reichmann stated she needs to know patient underlining condition to code chart correctly. Please call Cindy at [phone]. If she is unable to answer please leave VM on secure line

## 2019-09-27 NOTE — Telephone Encounter (Signed)
I called and sw Cindy advise ok per Erin quad tendonitis and generalized weakness. Will call with any other questions.

## 2019-10-02 ENCOUNTER — Telehealth: Payer: Self-pay

## 2019-10-02 NOTE — Telephone Encounter (Signed)
[name] from [facility] called she is requesting a verbal consent for PT. 1w2 2w2 1w1 Call back:[phone]

## 2019-10-03 NOTE — Telephone Encounter (Signed)
Called and advised PT verbal ok for orders below will call with any questions.

## 2019-10-17 ENCOUNTER — Telehealth: Payer: Self-pay

## 2019-10-17 NOTE — Telephone Encounter (Signed)
I called and lm on vm to advise verbal ok for the extension of PT orders for the pt. To call with any questions.

## 2019-10-17 NOTE — Telephone Encounter (Signed)
Sharyn Lull from [facility] called in to get more orders for patient   Extend 1x a week for 3 more weeks

## 2019-11-06 ENCOUNTER — Other Ambulatory Visit: Payer: Self-pay

## 2019-11-06 ENCOUNTER — Non-Acute Institutional Stay: Payer: Medicare Other | Admitting: Nurse Practitioner

## 2019-11-06 DIAGNOSIS — Z515 Encounter for palliative care: Secondary | ICD-10-CM

## 2019-11-06 DIAGNOSIS — R52 Pain, unspecified: Secondary | ICD-10-CM

## 2019-11-06 NOTE — Progress Notes (Signed)
[facility] Consult Note Telephone: [phone]  Fax: [phone]  PATIENT NAME: [name] [address] [place] [address] [phone] (home)  DOB: 1931-09-12 MRN: [id number]   PRIMARY CARE PROVIDER: Lajean Manes, MD [address] [place] [address]  REFERRING PROVIDER: Delle Reining   RESPONSIBLE PARTY:   Extended Emergency Contact Information Primary Emergency Contact: Marylou Mccoy States of [place] Phone: [phone] Relation: Sister Secondary Emergency Contact: Marcelle Overlie States of [place] Phone: [phone] Relation: Relative  I met face to face with patient in facility.   ASSESSMENT AND RECOMMENDATIONS:   Goal of care: Goal of care is function, to continue to maintain his strength through physical therapy and ability to participate in facility activities. Directives: Patient code status is DNR. Signed DNR form on chart and on [facility] EMR.  2. Symptom Management:  Pain: Left knee pain. Patient receives PT with [facility] team. Patient report ongoing pain, endorsed pain as 5/10, report he had therapy session today.  Patient however declined suggestion of requesting scheduling pain medication. Patient concerned about his pill burden, saying he takes too much meds as it is. Patient he will rather ask for pain medication when he needs it. Recommend starting patient on topical pain preparation to reduce pill burden.  Parkinson disease: Condition is progressing, staff report patient with occasional visual hallucination. Denied any today. Patient verbalized awareness that the objects he sees are not real. No report of worsening tremors or rigidity. Recommend close monitoring, may need to follow up with Neurology if symptoms worsens or becomes bothersome. Provided general support and encouragement, no other unmet needs identified at this time.  Palliative care will continue to provide support to patient, family and the medical team.    3. Follow up Palliative Care Visit: Palliative care will continue to follow for goals of care clarification and symptom management. Return in about 4- 8 weeks or prn.  4. Family /Caregiver/Community Supports: Patient has two children, both lives out of state, one lives in [place] other in [place], not as involved in his care, his sister [name] is his contact person.  5. Cognitive / Functional decline: Patient awake and alert, coherent. Patient requires minimal assist with baths, able to complete most ADLs independently. Uses Rolator walker to ambulate within his apartment (does not use it as often as he should), uses motorized scooter to ambulate within the facility. No report of fall in the last month. Patient enjoys participating in exercise activities, he uses the facility exercise room, verbalized caution for weight gain. Patient report eating 2 meals a day to prevent weight gain, he however report eating light snacks.  I spent 50 minutes providing this consultation, time includes time spent with patient/family, chart review, and documentation. More than 50% of the time in this consultation was spent coordinating communication.   HISTORY OF PRESENT ILLNESS:  [name] is a 86 y.o. year old male with multiple medical problems including CKD stage III, DM type II, DVTs, GERD, hypothyroidism, hypertension, paroxysmal A. Fib (on Eliquis), prostate cancer. Palliative Care was asked to help address goals of care. This is a follow up visit from 08/17/2019.  CODE STATUS: DNR  PPS: 60%  HOSPICE ELIGIBILITY/DIAGNOSIS: TBD  PAST MEDICAL HISTORY:  Past Medical History:  Diagnosis Date   Allergic rhinitis    Arthritis    arthritis-kyphosis   Asthmatic bronchitis    CAD in native  artery    a. STEMI 07/2017 s/p DES to LCx (25% mRCA and 70% oOM1 residual).   CKD (chronic kidney disease) stage 3,  GFR 30-59 ml/min    DM type 2 (diabetes mellitus, type 2) (HCC)    DVT, lower extremity, proximal, acute (HCC)    Dyslipidemia    GERD (gastroesophageal reflux disease)    Hearing impaired    bilateral hearing aids   History of kidney stones    History of skin cancer    Hypertension    Hypothyroidism    Macular degeneration    injections done bilaterally recent - 08-22-13   Normocytic anemia    PAF (paroxysmal atrial fibrillation) (HCC)    Parkinson's disease ([place])    Prostate cancer ([place])    Prostate cancer-radiation only,skin cancer-basal cell and last squamous cell right eye   Statin intolerance    Stroke ([place])    Urgency-frequency syndrome    Urticaria     SOCIAL HX:  Social History   Tobacco Use   Smoking status: Never Smoker   Smokeless tobacco: Never Used  Substance Use Topics   Alcohol use: No   FAMILY HX:  Family History  Problem Relation Age of Onset   Other Sister        ophth disease   Heart attack Father        x7   Heart disease Father        MI x7   Hypertension Mother    Stroke Mother 47   Amblyopia Neg Hx    Blindness Neg Hx    Glaucoma Neg Hx    Macular degeneration Neg Hx    Retinal detachment Neg Hx    Strabismus Neg Hx    Retinitis pigmentosa Neg Hx    Cataracts Neg Hx     ALLERGIES:  Allergies  Allergen Reactions   Colchicine Anaphylaxis   Hibiclens [Chlorhexidine] Hives   Atorvastatin Other (See Comments)    Joint pain    Ceftin Diarrhea   Celebrex [Celecoxib] Swelling   Codeine Hives   Erythromycin Diarrhea   Ezetimibe Other (See Comments)    Joint pain    Iodinated Diagnostic Agents Nausea And Vomiting and Rash   Oxycodone-Acetaminophen Hives   Rosuvastatin Other (See Comments)    Joint pain    Simvastatin Other (See Comments)    Joint pain    Cefuroxime    Celebrex [Celecoxib]    Codeine    Crestor [Rosuvastatin]    Erythromycin    Lipitor [Atorvastatin]     Nitroglycerin    Percocet [Oxycodone-Acetaminophen]    Protonix [Pantoprazole]    Tetanus Toxoids    Cefpodoxime Rash   Cefuroxime Axetil Rash   Nitroglycerin Other (See Comments)    Lowers patient's B/P   Vantin Other (See Comments)    Unknown/not noted on MAR??     PERTINENT MEDICATIONS:  Outpatient Encounter Medications as of 11/06/2019  Medication Sig   ACCU-CHEK GUIDE test strip    alfuzosin (UROXATRAL) 10 MG 24 hr tablet Take 1 tablet (10 mg total) by mouth at bedtime.   Alogliptin Benzoate 12.5 MG TABS Take 12.5 mg by mouth daily.    amiodarone (PACERONE) 200 MG tablet Take 1 tablet (200 mg total) by mouth 2 (two) times daily for 11 days.   amiodarone (PACERONE) 200 MG tablet Take 1 tablet (200 mg total) by mouth daily.   apixaban (ELIQUIS) 2.5 MG TABS tablet Take 2.5 mg by mouth 2 (two) times daily.  brimonidine-timolol (COMBIGAN) 0.2-0.5 % ophthalmic solution Place 1 drop into both eyes 2 (two) times daily.   brinzolamide (AZOPT) 1 % ophthalmic suspension Place 1 drop into the right eye 3 (three) times daily.    carbidopa-levodopa (SINEMET IR) 25-100 MG tablet Take 1 tablet by mouth 3 (three) times daily.    Carboxymethylcellulose Sodium (ARTIFICIAL TEARS OP) Place 1 drop into both eyes 4 (four) times daily.   Cholecalciferol (VITAMIN D-3) 5000 units TABS Take 5,000 Units by mouth daily.   clopidogrel (PLAVIX) 75 MG tablet Take 75 mg by mouth daily.    colesevelam (WELCHOL) 625 MG tablet Take 1,875 mg by mouth every evening.    Cyanocobalamin (VITAMIN B-12) 2500 MCG SUBL Place 1 tablet under the tongue daily.   diltiazem (CARDIZEM CD) 120 MG 24 hr capsule Take 1 capsule (120 mg total) by mouth daily for 30 days.   docusate sodium (COLACE) 100 MG capsule Take 100 mg by mouth 2 (two) times daily.    ferrous sulfate 325 (65 FE) MG EC tablet Take 1 tablet (325 mg total) by mouth 2 (two) times daily with a meal.   fluticasone (FLONASE) 50 MCG/ACT nasal  spray Place 2 sprays into both nostrils daily.   latanoprost (XALATAN) 0.005 % ophthalmic solution Place 1 drop into the right eye at bedtime.    levocetirizine (XYZAL) 5 MG tablet Take 2.5 mg by mouth at bedtime.    levothyroxine (SYNTHROID) 150 MCG tablet Take 150 mcg by mouth daily.    Melatonin 3 MG SUBL Place 3 mg under the tongue at bedtime.   Meth-Hyo-M Bl-Na Phos-Ph Sal (URO-MP) 118 MG CAPS Take 1 capsule by mouth 3 (three) times daily.   metoprolol succinate (TOPROL-XL) 25 MG 24 hr tablet Take 1 tablet (25 mg total) by mouth daily.   mirabegron ER (MYRBETRIQ) 50 MG TB24 tablet Take 1 tablet (50 mg total) by mouth daily.   Multiple Vitamins-Minerals (PRESERVISION/LUTEIN) CAPS Take 1 capsule by mouth 2 (two) times daily.   pantoprazole (PROTONIX) 40 MG tablet Take 40 mg by mouth daily.   polyethylene glycol (MIRALAX / GLYCOLAX) packet Take 17 g by mouth daily. MIX AND DRINK   sertraline (ZOLOFT) 25 MG tablet Take 25 mg by mouth daily.   traMADol (ULTRAM) 50 MG tablet Take 1 tablet (50 mg total) by mouth every 6 (six) hours as needed. (Patient taking differently: Take 50 mg by mouth every 6 (six) hours as needed for moderate pain. )   No facility-administered encounter medications on file as of 11/06/2019.    PHYSICAL EXAM / ROS:   Current and past weights: 161.4lbs up from 160lbs at last palliative care visit 2 months ago. Ht 4f10", BMI 23.2kg/m2 General: well appearing, well dressed, sitting in recliner in his room watching TV, no evidence of acute distress Cardiovascular: denied chest pain, denied palpitation, +2 edema to BLE, TED hose on, S1S2 normal Pulmonary: no cough, no SOB, room air GI: no swallowing problem, appetite fair, denied constipation, continent of bowel GU: denies dysuria, incontinent of urine MSK:  Left knee ROM limited by pain, no swelling or redness noted Skin: no rashes or wounds reported  Neurological: weakness, but otherwise nonfocal  QJari Favre DNP, AGPCNP-BC

## 2019-11-17 ENCOUNTER — Other Ambulatory Visit: Payer: Self-pay

## 2019-11-17 ENCOUNTER — Non-Acute Institutional Stay: Payer: Medicare Other | Admitting: Nurse Practitioner

## 2019-11-17 DIAGNOSIS — Z515 Encounter for palliative care: Secondary | ICD-10-CM

## 2019-11-17 DIAGNOSIS — R600 Localized edema: Secondary | ICD-10-CM

## 2019-11-17 NOTE — Progress Notes (Signed)
[facility] Consult Note Telephone: [phone]  Fax: [phone]  PATIENT NAME: [name] [address] [place] [address] [phone] (home)  DOB: 1932-01-04 MRN: [id number]  PRIMARY CARE PROVIDER:    Lajean Manes, MD [address] [place] [address]  REFERRING PROVIDER:   Delle Reining   RESPONSIBLE PARTY:   Extended Emergency Contact Information Primary Emergency Contact: [name] States of [place] Phone: [phone] Relation: Sister Secondary Emergency Contact: [name] States of [place] Phone: [phone] Relation: Relative  I met face to face with patient in facility.  ASSESSMENT AND RECOMMENDATIONS:   1. Advance care plan Goal of care: Goal of care is function, to continue to maintain his strength through physical therapy and ability to participate in facility activities. Directives: Patient code status is DNR. Signed DNR form on chart and on [facility] EMR.  2. Symptom Management: Staff report patient with declining health, reporting that patient had a fall 2 days ago. During visit, patient report Rolator walker rolled away from him while getting out of his chair. Patient denied any pain. Patient awake and appropriate during visit, no sign of acute distress. Patient however was noted with increased edema to bilateral lower legs, had 6lbs weight gain in the last month and a total of 16lb weight gain in the last 3 months.  Recommendation: Consider short term low dose diuretic. Patient already uses Ted hose for compression. Consider short term daily weight to monitor therapy. Visit findings and recommendation discussed with facility provider Delle Reining via telephone call.  3. Follow up Palliative Care Visit: Palliative care will continue to follow for goals of care clarification and symptom management. Return in about 4 weeks or prn.  4.  Family /Caregiver/Community Supports: Patient has two children, both lives out of state, one lives in [place] other in [place], his sister [name] is his contact person. Called sister and reviewed visit findings with sister, she voiced no concerns for patient.  5. Cognitive / Functional decline: Patient awake and alert, coherent. Patient requires minimal assist with baths, able to complete most ADLs independently. Uses Rolator walker to ambulate within his apartment (does not use it as often as he should), uses motorized scooter to ambulate within the facility. Facility report fall last week and slow decline. Patient enjoys participating in exercise activities, he uses the facility exercise room.   I spent 50 minutes providing this consultation, time includes time spent with patient and on phone with family, chart review, provider coordination, and documentation. More than 50% of the time in this consultation was spent coordinating communication.   HISTORY OF PRESENT ILLNESS:  [name] is a 86 y.o. year old male with multiple medical problems includingCKD stage III, DM type II, DVTs, GERD, hypothyroidism, hypertension, paroxysmal A. Fib (on Eliquis), prostate cancer. Palliative Care was asked to help address goals of care. This is a follow up visit from 11/06/2019.  CODE STATUS: DNR  PPS: 60%  HOSPICE ELIGIBILITY/DIAGNOSIS: TBD  PAST MEDICAL HISTORY:  Past Medical History:  Diagnosis Date  . Allergic rhinitis   . Arthritis    arthritis-kyphosis  . Asthmatic bronchitis   . CAD in native artery    a. STEMI 07/2017 s/p DES to LCx (25% mRCA and 70% oOM1 residual).  . CKD (chronic kidney disease) stage 3, GFR 30-59 ml/min   . DM type 2 (diabetes mellitus, type 2) ([place])   .  DVT, lower extremity, proximal, acute ([facility])   . Dyslipidemia   . GERD (gastroesophageal reflux disease)   . Hearing impaired    bilateral hearing aids  . History of kidney stones   . History of skin cancer   .  Hypertension   . Hypothyroidism   . Macular degeneration    injections done bilaterally recent - 08-22-13  . Normocytic anemia   . PAF (paroxysmal atrial fibrillation) ([place])   . Parkinson's disease ([place])   . Prostate cancer [facility])    Prostate cancer-radiation only,skin cancer-basal cell and last squamous cell right eye  . Statin intolerance   . Stroke ([place])   . Urgency-frequency syndrome   . Urticaria     SOCIAL HX:  Social History   Tobacco Use  . Smoking status: Never Smoker  . Smokeless tobacco: Never Used  Substance Use Topics  . Alcohol use: No   FAMILY HX:  Family History  Problem Relation Age of Onset  . Other Sister        ophth disease  . Heart attack Father        x7  . Heart disease Father        MI x7  . Hypertension Mother   . Stroke Mother 42  . Amblyopia Neg Hx   . Blindness Neg Hx   . Glaucoma Neg Hx   . Macular degeneration Neg Hx   . Retinal detachment Neg Hx   . Strabismus Neg Hx   . Retinitis pigmentosa Neg Hx   . Cataracts Neg Hx     ALLERGIES:  Allergies  Allergen Reactions  . Colchicine Anaphylaxis  . Hibiclens [Chlorhexidine] Hives  . Atorvastatin Other (See Comments)    Joint pain   . Ceftin Diarrhea  . Celebrex [Celecoxib] Swelling  . Codeine Hives  . Erythromycin Diarrhea  . Ezetimibe Other (See Comments)    Joint pain   . Iodinated Diagnostic Agents Nausea And Vomiting and Rash  . Oxycodone-Acetaminophen Hives  . Rosuvastatin Other (See Comments)    Joint pain   . Simvastatin Other (See Comments)    Joint pain   . Cefuroxime   . Celebrex [Celecoxib]   . Codeine   . Crestor [Rosuvastatin]   . Erythromycin   . Lipitor [Atorvastatin]   . Nitroglycerin   . Percocet [Oxycodone-Acetaminophen]   . Protonix [Pantoprazole]   . Tetanus Toxoids   . Cefpodoxime Rash  . Cefuroxime Axetil Rash  . Nitroglycerin Other (See Comments)    Lowers patient's B/P  . Vantin Other (See Comments)    Unknown/not noted on MAR??       PERTINENT MEDICATIONS:  Outpatient Encounter Medications as of 11/17/2019  Medication Sig  . ACCU-CHEK GUIDE test strip   . alfuzosin (UROXATRAL) 10 MG 24 hr tablet Take 1 tablet (10 mg total) by mouth at bedtime.  . Alogliptin Benzoate 12.5 MG TABS Take 12.5 mg by mouth daily.   Marland Kitchen amiodarone (PACERONE) 200 MG tablet Take 1 tablet (200 mg total) by mouth 2 (two) times daily for 11 days.  Marland Kitchen amiodarone (PACERONE) 200 MG tablet Take 1 tablet (200 mg total) by mouth daily.  Marland Kitchen apixaban (ELIQUIS) 2.5 MG TABS tablet Take 2.5 mg by mouth 2 (two) times daily.  . brimonidine-timolol (COMBIGAN) 0.2-0.5 % ophthalmic solution Place 1 drop into both eyes 2 (two) times daily.  . brinzolamide (AZOPT) 1 % ophthalmic suspension Place 1 drop into the right eye 3 (three) times daily.   . carbidopa-levodopa (SINEMET  IR) 25-100 MG tablet Take 1 tablet by mouth 3 (three) times daily.   . Carboxymethylcellulose Sodium (ARTIFICIAL TEARS OP) Place 1 drop into both eyes 4 (four) times daily.  . Cholecalciferol (VITAMIN D-3) 5000 units TABS Take 5,000 Units by mouth daily.  . clopidogrel (PLAVIX) 75 MG tablet Take 75 mg by mouth daily.   . colesevelam (WELCHOL) 625 MG tablet Take 1,875 mg by mouth every evening.   . Cyanocobalamin (VITAMIN B-12) 2500 MCG SUBL Place 1 tablet under the tongue daily.  Marland Kitchen diltiazem (CARDIZEM CD) 120 MG 24 hr capsule Take 1 capsule (120 mg total) by mouth daily for 30 days.  Marland Kitchen docusate sodium (COLACE) 100 MG capsule Take 100 mg by mouth 2 (two) times daily.   . ferrous sulfate 325 (65 FE) MG EC tablet Take 1 tablet (325 mg total) by mouth 2 (two) times daily with a meal.  . fluticasone (FLONASE) 50 MCG/ACT nasal spray Place 2 sprays into both nostrils daily.  Marland Kitchen latanoprost (XALATAN) 0.005 % ophthalmic solution Place 1 drop into the right eye at bedtime.   Marland Kitchen levocetirizine (XYZAL) 5 MG tablet Take 2.5 mg by mouth at bedtime.   Marland Kitchen levothyroxine (SYNTHROID) 150 MCG tablet Take 150 mcg by mouth  daily.   . Melatonin 3 MG SUBL Place 3 mg under the tongue at bedtime.  . Meth-Hyo-M Bl-Na Phos-Ph Sal (URO-MP) 118 MG CAPS Take 1 capsule by mouth 3 (three) times daily.  . metoprolol succinate (TOPROL-XL) 25 MG 24 hr tablet Take 1 tablet (25 mg total) by mouth daily.  . mirabegron ER (MYRBETRIQ) 50 MG TB24 tablet Take 1 tablet (50 mg total) by mouth daily.  . Multiple Vitamins-Minerals (PRESERVISION/LUTEIN) CAPS Take 1 capsule by mouth 2 (two) times daily.  . pantoprazole (PROTONIX) 40 MG tablet Take 40 mg by mouth daily.  . polyethylene glycol (MIRALAX / GLYCOLAX) packet Take 17 g by mouth daily. [place]  . sertraline (ZOLOFT) 25 MG tablet Take 25 mg by mouth daily.  . traMADol (ULTRAM) 50 MG tablet Take 1 tablet (50 mg total) by mouth every 6 (six) hours as needed. (Patient taking differently: Take 50 mg by mouth every 6 (six) hours as needed for moderate pain. )   No facility-administered encounter medications on file as of 11/17/2019.    PHYSICAL EXAM / ROS:   Current and past weights: 166lbs up from 161.4lbs a month ago and 150.9lbs three months ago, Ht 39f10", BMI 23.8kg/m2 General: NAD, frail appearing, sitting in a recliner in his room in NAD. Cardiovascular: denied chest pain, +3 edema  Pulmonary: no cough, no increased SOB, room air MSK:  no joint and ROM abnormalities, ambulatory Skin: skin tear to left fore arm Neurological: Weakness, but otherwise nonfocal  QJari Favre DNP, AGPCNP-BC

## 2020-03-11 NOTE — Progress Notes (Signed)
Primary Care Physician: Patient, No Pcp Per Primary Cardiologist: Dr Radford Pax Primary Electrophysiologist: none Referring Physician: [facility]   [name] is a 87 y.o. male with a history of CKD stage III, DM type II, DVTs, GERD, hypothyroidism, hypertension, paroxysmal A. fib, prostate cancer who presents for follow up in the [facility]. Patient is on Eliquis for a CHADS2VASC score of 7. Patient has been to the ED several times recently for afib with RVR. Most recently he was discharged on 08/05/19 after being admitted for near-syncope and found to be in afib with RVR. He was loaded on amiodarone for rhythm control. He converted to SR on the medication.   On follow up today, patient reports that he has done well since his last visit. He denies any heart racing or palpitations. He denies any bleeding issues on anticoagulation.   Today, he denies symptoms of palpitations, chest pain, shortness of breath, orthopnea, PND, lower extremity edema, dizziness, presyncope, syncope, snoring, daytime somnolence, bleeding, or neurologic sequela. The patient is tolerating medications without difficulties and is otherwise without complaint today.    Atrial Fibrillation Risk Factors:  he does not have symptoms or diagnosis of sleep apnea. he does not have a history of rheumatic fever. he does not have a history of alcohol use.   he has a BMI of Body mass index is 23.36 kg/m.Marland Kitchen Filed Weights   03/12/20 1010  Weight: 73.8 kg    Family History  Problem Relation Age of Onset  . Other Sister        ophth disease  . Heart attack Father        x7  . Heart disease Father        MI x7  . Hypertension Mother   . Stroke Mother 48  . Amblyopia Neg Hx   . Blindness Neg Hx   . Glaucoma Neg Hx   . Macular degeneration Neg Hx   . Retinal detachment Neg Hx   . Strabismus Neg Hx   . Retinitis pigmentosa Neg Hx   . Cataracts Neg Hx      Atrial Fibrillation  Management history:  Previous antiarrhythmic drugs: amiodarone Previous cardioversions: none Previous ablations: none CHADS2VASC score: 7 Anticoagulation history: Eliquis    Past Medical History:  Diagnosis Date  . Allergic rhinitis   . Arthritis    arthritis-kyphosis  . Asthmatic bronchitis   . CAD in native artery    a. STEMI 07/2017 s/p DES to LCx (25% mRCA and 70% oOM1 residual).  . CKD (chronic kidney disease) stage 3, GFR 30-59 ml/min (HCC)   . DM type 2 (diabetes mellitus, type 2) ([place])   . DVT, lower extremity, proximal, acute ([place])   . Dyslipidemia   . GERD (gastroesophageal reflux disease)   . Hearing impaired    bilateral hearing aids  . History of kidney stones   . History of skin cancer   . Hypertension   . Hypothyroidism   . Macular degeneration    injections done bilaterally recent - 08-22-13  . Normocytic anemia   . PAF (paroxysmal atrial fibrillation) ([place])   . Parkinson's disease ([place])   . Prostate cancer [facility])    Prostate cancer-radiation only,skin cancer-basal cell and last squamous cell right eye  . Statin intolerance   . Stroke ([place])   . Urgency-frequency syndrome   . Urticaria    Past Surgical History:  Procedure Laterality Date  . BACK SURGERY  lumbar fracture  . cataract surgery Bilateral   . COLONOSCOPY W/ POLYPECTOMY     x2   . CORONARY STENT INTERVENTION N/A 08/06/2017   Procedure: CORONARY STENT INTERVENTION;  Surgeon: Jettie Booze, MD;  Location: [facility];  Service: Cardiovascular;  Laterality: N/A;  . CYSTOSCOPY W/ URETERAL STENT PLACEMENT Right 08/13/2017   Procedure: CYSTOSCOPY WITH RIGHT  RETROGRADE PYELOGRAM/URETERAL STENT PLACEMENT;  Surgeon: Raynelle Bring, MD;  Location: [facility];  Service: Urology;  Laterality: Right;  . CYSTOSCOPY WITH INJECTION N/A 09/11/2013   Procedure: CYSTOSCOPY WITH BOTOX INJECTION INTO THE BLADDER;  Surgeon: Ailene Rud, MD;  Location: [facility];  Service: Urology;  Laterality: N/A;   . CYSTOSCOPY WITH RETROGRADE PYELOGRAM, URETEROSCOPY AND STENT PLACEMENT    . CYSTOSCOPY WITH RETROGRADE PYELOGRAM, URETEROSCOPY AND STENT PLACEMENT Right 12/22/2018   Procedure: CYSTOSCOPY WITH RETROGRADE PYELOGRAM, URETEROSCOPY AND STENT PLACEMENT;  Surgeon: Cleon Gustin, MD;  Location: [facility];  Service: Urology;  Laterality: Right;  90 MINS  . CYSTOSCOPY WITH STENT PLACEMENT Right 01/26/2016   Procedure: CYSTOSCOPY, RIGHT RETROGRADE WITH RIGHT URETERAL STENT PLACEMENT;  Surgeon: Cleon Gustin, MD;  Location: [facility];  Service: Urology;  Laterality: Right;  . CYSTOSCOPY/RETROGRADE/URETEROSCOPY Right 04/22/2016   Procedure: CYSTOSCOPY/RETROGRADE/ REMOVAL JJ STENT right insertion stent right insertion of foley;  Surgeon: Franchot Gallo, MD;  Location: [facility];  Service: Urology;  Laterality: Right;  . EXTRACORPOREAL SHOCK WAVE LITHOTRIPSY Right 02/27/2016   Procedure: RIGHT EXTRACORPOREAL SHOCK WAVE LITHOTRIPSY (ESWL) GAITED;  Surgeon: Cleon Gustin, MD;  Location: [facility];  Service: Urology;  Laterality: Right;  . EXTRACORPOREAL SHOCK WAVE LITHOTRIPSY Right 02/10/2016   Procedure: EXTRACORPOREAL SHOCK WAVE LITHOTRIPSY (ESWL);  Surgeon: Kathie Rhodes, MD;  Location: [facility];  Service: Urology;  Laterality: Right;  [phone] [id number] [employer]- [id number]   . EYE SURGERY     macular degeneration  . GALLBLADDER SURGERY  2006   no cholecystectomy  . HERNIA REPAIR    . HOLMIUM LASER APPLICATION Right 64/33/2951   Procedure: HOLMIUM LASER APPLICATION;  Surgeon: Cleon Gustin, MD;  Location: [facility];  Service: Urology;  Laterality: Right;  . LEFT HEART CATH AND CORONARY ANGIOGRAPHY N/A 08/06/2017   Procedure: LEFT HEART CATH AND CORONARY ANGIOGRAPHY;  Surgeon: Jettie Booze, MD;  Location: [facility];  Service: Cardiovascular;  Laterality: N/A;  . LUNG REMOVAL, PARTIAL  age 87   for bronchiectasis  . PARTIAL THYMECTOMY  1985   for  nodules  . POPLITEAL SYNOVIAL CYST EXCISION  2005  . TOTAL KNEE ARTHROPLASTY  2008   right  . TOTAL KNEE ARTHROPLASTY  2010   left    Current Outpatient Medications  Medication Sig Dispense Refill  . ACCU-CHEK GUIDE test strip     . alfuzosin (UROXATRAL) 10 MG 24 hr tablet Take 1 tablet (10 mg total) by mouth at bedtime. 30 tablet 0  . Alogliptin Benzoate 12.5 MG TABS Take 12.5 mg by mouth daily.     Marland Kitchen amiodarone (PACERONE) 200 MG tablet Take 1 tablet (200 mg total) by mouth daily.    Marland Kitchen apixaban (ELIQUIS) 2.5 MG TABS tablet Take 2.5 mg by mouth 2 (two) times daily.    . brimonidine-timolol (COMBIGAN) 0.2-0.5 % ophthalmic solution Place 1 drop into both eyes 2 (two) times daily.    . brinzolamide (AZOPT) 1 % ophthalmic suspension Place 1 drop into the right eye 3 (three) times daily.     . carbidopa-levodopa (SINEMET IR)  25-100 MG tablet Take 1 tablet by mouth 3 (three) times daily.     . Carboxymethylcellulose Sodium (ARTIFICIAL TEARS OP) Place 1 drop into both eyes 4 (four) times daily.    . Cholecalciferol (VITAMIN D-3) 5000 units TABS Take 5,000 Units by mouth daily.    . clopidogrel (PLAVIX) 75 MG tablet Take 75 mg by mouth daily.     . colesevelam (WELCHOL) 625 MG tablet Take 1,875 mg by mouth every evening.     . Cyanocobalamin (VITAMIN B-12) 2500 MCG SUBL Place 1 tablet under the tongue daily.    Marland Kitchen diltiazem (CARDIZEM CD) 120 MG 24 hr capsule Take 1 capsule (120 mg total) by mouth daily for 30 days. 30 capsule 0  . docusate sodium (COLACE) 100 MG capsule Take 100 mg by mouth 2 (two) times daily.     . fluticasone (FLONASE) 50 MCG/ACT nasal spray Place 2 sprays into both nostrils daily.    . furosemide (LASIX) 20 MG tablet Take by mouth.    . latanoprost (XALATAN) 0.005 % ophthalmic solution Place 1 drop into the right eye at bedtime.     Marland Kitchen levocetirizine (XYZAL) 5 MG tablet Take 2.5 mg by mouth at bedtime.     Marland Kitchen levothyroxine (SYNTHROID) 150 MCG tablet Take 150 mcg by mouth daily.      . Melatonin 3 MG SUBL Place 3 mg under the tongue at bedtime.    . Meth-Hyo-M Bl-Na Phos-Ph Sal (URO-MP) 118 MG CAPS Take 1 capsule by mouth 3 (three) times daily.    . metoprolol succinate (TOPROL-XL) 25 MG 24 hr tablet Take 1 tablet (25 mg total) by mouth daily.    . mirabegron ER (MYRBETRIQ) 50 MG TB24 tablet Take 1 tablet (50 mg total) by mouth daily. 30 tablet 0  . Multiple Vitamins-Minerals (PRESERVISION/LUTEIN) CAPS Take 1 capsule by mouth 2 (two) times daily.    . pantoprazole (PROTONIX) 40 MG tablet Take 40 mg by mouth daily.    . polyethylene glycol (MIRALAX / GLYCOLAX) packet Take 17 g by mouth daily. [place]    . Potassium Chloride ER 20 MEQ TBCR Take 1 tablet by mouth daily.    . sertraline (ZOLOFT) 25 MG tablet Take 25 mg by mouth daily.    Marland Kitchen amiodarone (PACERONE) 200 MG tablet Take 1 tablet (200 mg total) by mouth 2 (two) times daily for 11 days. 22 tablet 0  . ferrous sulfate 325 (65 FE) MG EC tablet Take 1 tablet (325 mg total) by mouth 2 (two) times daily with a meal. 60 tablet 11   No current facility-administered medications for this encounter.    Allergies  Allergen Reactions  . Colchicine Anaphylaxis  . Hibiclens [Chlorhexidine] Hives  . Atorvastatin Other (See Comments)    Joint pain   . Ceftin Diarrhea  . Celebrex [Celecoxib] Swelling  . Codeine Hives  . Erythromycin Diarrhea  . Ezetimibe Other (See Comments)    Joint pain   . Iodinated Diagnostic Agents Nausea And Vomiting and Rash  . Oxycodone-Acetaminophen Hives  . Rosuvastatin Other (See Comments)    Joint pain   . Simvastatin Other (See Comments)    Joint pain   . Cefuroxime   . Celebrex [Celecoxib]   . Codeine   . Crestor [Rosuvastatin]   . Erythromycin   . Lipitor [Atorvastatin]   . Nitroglycerin   . Percocet [Oxycodone-Acetaminophen]   . Protonix [Pantoprazole]   . Tetanus Toxoids   . Cefpodoxime Rash  . Cefuroxime Axetil  Rash  . Nitroglycerin Other (See Comments)    Lowers  patient's B/P  . Vantin Other (See Comments)    Unknown/not noted on MAR??    Social History   Socioeconomic History  . Marital status: Married    Spouse name: Not on file  . Number of children: 2  . Years of education: Not on file  . Highest education level: Not on file  Occupational History  . Occupation: retired  Tobacco Use  . Smoking status: Never Smoker  . Smokeless tobacco: Never Used  Substance and Sexual Activity  . Alcohol use: No  . Drug use: No  . Sexual activity: Not Currently  Other Topics Concern  . Not on file  Social History Narrative   ** Merged History Encounter **       MUST HAVE MEDS LISTED DO NOT SUBSTITUTE GENERICS PATIENT IS ALLERGIC TO DYES   Social Determinants of Health   Financial Resource Strain: Not on file  Food Insecurity: Not on file  Transportation Needs: Not on file  Physical Activity: Not on file  Stress: Not on file  Social Connections: Not on file  Intimate Partner Violence: Not on file     ROS- All systems are reviewed and negative except as per the HPI above.  Physical Exam: Vitals:   03/12/20 1010  BP: 122/74  Pulse: (!) 59  Weight: 73.8 kg  Height: 5\' 10"  (1.778 m)    GEN- The patient is well appearing elderly male, alert and oriented x 3 today.   HEENT-head normocephalic, atraumatic, sclera clear, conjunctiva pink, hearing intact, trachea midline. Lungs- Clear to ausculation bilaterally, normal work of breathing Heart- Regular rate and rhythm, no murmurs, rubs or gallops  GI- soft, NT, ND, + BS Extremities- no clubbing, cyanosis, or edema MS- no significant deformity or atrophy Skin- no rash or lesion Psych- euthymic mood, full affect Neuro- strength and sensation are intact   Wt Readings from Last 3 Encounters:  03/12/20 73.8 kg  09/14/19 73.5 kg  09/11/19 73.8 kg    EKG today demonstrates  SB, 1st degree AV block Vent. rate 59 BPM PR interval 268 ms QRS duration 94 ms QT/QTc 462/457 ms  Echo  07/12/19 demonstrated  1. Mild concentric LVH with moderate hypertrophy of the basal septum.  Left ventricular ejection fraction, by estimation, is 55 to 60%. The left  ventricle has normal function. The left ventricle has no regional wall  motion abnormalities. There is mild  concentric left ventricular hypertrophy. Left ventricular diastolic  parameters are consistent with Grade II diastolic dysfunction  (pseudonormalization).  2. Right ventricular systolic function is normal. The right ventricular  size is normal. There is mildly elevated pulmonary artery systolic  pressure.  3. Left atrial size was moderately dilated.  4. The mitral valve is normal in structure. Trivial mitral valve  regurgitation. No evidence of mitral stenosis.  5. The aortic valve is tricuspid. Aortic valve regurgitation is mild. No  aortic stenosis is present.  6. The inferior vena cava is dilated in size with <50% respiratory  variability, suggesting right atrial pressure of 15 mmHg.   Epic records are reviewed at length today  CHA2DS2-VASc Score = 7  The patient's score is based upon: CHF History: No HTN History: Yes Diabetes History: Yes Stroke History: Yes Vascular Disease History: Yes      ASSESSMENT AND PLAN: 1. Paroxysmal Atrial Fibrillation (ICD10:  I48.0) The patient's CHA2DS2-VASc score is 7, indicating a 11.2% annual risk of stroke.  Patient appears to be maintaining SR.  Continue amiodarone 200 mg daily. Check cmet/TSH Continue Eliquis 2.5 mg BID (age, Cr frequently above 1.5)  2. Secondary Hypercoagulable State (ICD10:  D68.69) The patient is at significant risk for stroke/thromboembolism based upon his CHA2DS2-VASc Score of 7.  Continue Apixaban (Eliquis).   3. CAD S/p STEMI 2019. No anginal symptoms.  4. HTN Stable, no changes today.   Follow up with Dr Radford Pax in 3 months. AF clinic in 6 months.    [facility] [address] [place], [place] [address] [phone] 03/12/2020 11:04 AM

## 2020-03-12 ENCOUNTER — Other Ambulatory Visit: Payer: Self-pay

## 2020-03-12 ENCOUNTER — Encounter (HOSPITAL_COMMUNITY): Payer: Self-pay | Admitting: Physician Assistant

## 2020-03-12 ENCOUNTER — Ambulatory Visit (HOSPITAL_COMMUNITY)
Admission: RE | Admit: 2020-03-12 | Discharge: 2020-03-12 | Disposition: A | Discharge status: Home / Self Care | Payer: Medicare Other | Source: Ambulatory Visit | Attending: Physician Assistant | Admitting: Physician Assistant

## 2020-03-12 VITALS — BP 122/74 | HR 59 | Ht 70.0 in | Wt 162.8 lb

## 2020-03-12 DIAGNOSIS — Z7902 Long term (current) use of antithrombotics/antiplatelets: Secondary | ICD-10-CM | POA: Diagnosis not present

## 2020-03-12 DIAGNOSIS — I129 Hypertensive chronic kidney disease with stage 1 through stage 4 chronic kidney disease, or unspecified chronic kidney disease: Secondary | ICD-10-CM | POA: Diagnosis not present

## 2020-03-12 DIAGNOSIS — Z79899 Other long term (current) drug therapy: Secondary | ICD-10-CM | POA: Insufficient documentation

## 2020-03-12 DIAGNOSIS — Z8249 Family history of ischemic heart disease and other diseases of the circulatory system: Secondary | ICD-10-CM | POA: Insufficient documentation

## 2020-03-12 DIAGNOSIS — N183 Chronic kidney disease, stage 3 unspecified: Secondary | ICD-10-CM | POA: Diagnosis not present

## 2020-03-12 DIAGNOSIS — I2510 Atherosclerotic heart disease of native coronary artery without angina pectoris: Secondary | ICD-10-CM | POA: Insufficient documentation

## 2020-03-12 DIAGNOSIS — I252 Old myocardial infarction: Secondary | ICD-10-CM | POA: Insufficient documentation

## 2020-03-12 DIAGNOSIS — Z7901 Long term (current) use of anticoagulants: Secondary | ICD-10-CM | POA: Insufficient documentation

## 2020-03-12 DIAGNOSIS — D6869 Other thrombophilia: Secondary | ICD-10-CM | POA: Insufficient documentation

## 2020-03-12 DIAGNOSIS — I480 Paroxysmal atrial fibrillation: Secondary | ICD-10-CM | POA: Insufficient documentation

## 2020-03-12 DIAGNOSIS — E1122 Type 2 diabetes mellitus with diabetic chronic kidney disease: Secondary | ICD-10-CM | POA: Insufficient documentation

## 2020-03-12 LAB — COMPREHENSIVE METABOLIC PANEL
ALT: <5 U/L (ref 0–44)
AST: 10 U/L — ABNORMAL LOW (ref 15–41)
Albumin: 3.5 g/dL (ref 3.5–5.0)
Alkaline Phosphatase: 76 U/L (ref 38–126)
Anion gap: 9 (ref 5–15)
BUN: 34 mg/dL — ABNORMAL HIGH (ref 8–23)
CO2: 24 mmol/L (ref 22–32)
Calcium: 8.4 mg/dL — ABNORMAL LOW (ref 8.9–10.3)
Chloride: 108 mmol/L (ref 98–111)
Creatinine, Ser: 2.04 mg/dL — ABNORMAL HIGH (ref 0.61–1.24)
GFR, Estimated: 31 mL/min — ABNORMAL LOW (ref >60)
Glucose, Bld: 142 mg/dL — ABNORMAL HIGH (ref 70–99)
Potassium: 4.3 mmol/L (ref 3.5–5.1)
Sodium: 141 mmol/L (ref 135–145)
Total Bilirubin: 0.8 mg/dL (ref 0.3–1.2)
Total Protein: 6 g/dL — ABNORMAL LOW (ref 6.5–8.1)

## 2020-03-12 LAB — TSH: TSH: 2.654 u[IU]/mL (ref 0.350–4.500)

## 2020-06-04 ENCOUNTER — Encounter: Payer: Self-pay | Admitting: Cardiology

## 2020-06-04 ENCOUNTER — Ambulatory Visit (INDEPENDENT_AMBULATORY_CARE_PROVIDER_SITE_OTHER): Payer: Medicare Other | Admitting: Cardiology

## 2020-06-04 ENCOUNTER — Other Ambulatory Visit: Payer: Self-pay

## 2020-06-04 VITALS — BP 128/58 | HR 62 | Ht 70.0 in | Wt 158.4 lb

## 2020-06-04 DIAGNOSIS — I10 Essential (primary) hypertension: Secondary | ICD-10-CM

## 2020-06-04 DIAGNOSIS — E785 Hyperlipidemia, unspecified: Secondary | ICD-10-CM

## 2020-06-04 DIAGNOSIS — I480 Paroxysmal atrial fibrillation: Secondary | ICD-10-CM

## 2020-06-04 DIAGNOSIS — I2510 Atherosclerotic heart disease of native coronary artery without angina pectoris: Secondary | ICD-10-CM | POA: Diagnosis not present

## 2020-06-04 DIAGNOSIS — N1831 Chronic kidney disease, stage 3a: Secondary | ICD-10-CM

## 2020-06-04 NOTE — Progress Notes (Signed)
Date:  06/04/2020   ID:  [name], DOB Jan 20, 1931, MRN [id number]   PCP:  Patient, No Pcp Per (Inactive)  Cardiologist:  Fransico Him, MD  Electrophysiologist:  None   Chief Complaint:  Followup of CAD, HTN, hyperlipidemia and PAF  History of Present Illness: He   [name] is a 87 y.o. male  with a history of type 2 DM, HTN, CKD, CVA, prostate CA and Parkinson's dz who was admitted 07/2017 with STEMI and cath showed 25% mRCA, 70% oOM1, 80% mLCx and underwent PCI with DES to the LCx. 2D echo showed normal LVF with EF 55-60% with G2DD rheumatic dx of the anterior MVL with mild thickening and moderately dilated LA.  He is now on DAPT.  He is intolerant to statins.   He was readmitted 08/2017 with fever and left hip pain and CT showed right hydronephrosis with nephrolithiasis and underwent cycstocopy and stent placement.  His hospitalization was complicated by afib with RVR and converted on Cardizem.  He was to continue on ASA and Brilinta and no DOAC as this occurred in the setting of acute infection and fever.    He was readmitted again 03/11/2018 with chest pain while going to the bathroom.  He was noted to be in afib with RVR by EMS.  He stated that he rides a stationary bike for 15 minutes without CP.  He could feel the palpitations during the episode of CP.  Trop was elevated but felt related to demand ischemia.  He was started on Eliquis 2.5mg  BID (CHADS2VASC score 8) and Cardizem was added to his BB.  His ASA was stopped and he was transitioned to Plavix from [place].  He is followed in afib clinic. Since I saw him last he was admitted for near syncope and found to be in afib with RVR and loaded with Amio for rhythm control and converted to SR.  He is here today for followup and is doing well.  hH denies any chest pain or pressure, SOB, DOE, PND, orthopnea, LE edema, dizziness, palpitations or syncope. He is compliant with his meds and is tolerating meds with no SE.    Prior CV  studies:   The following studies were reviewed today:  Cardiac cath 07/2017, 2D echo 02/2018  Past Medical History:  Diagnosis Date  . Allergic rhinitis   . Arthritis    arthritis-kyphosis  . Asthmatic bronchitis   . CAD in native artery    a. STEMI 07/2017 s/p DES to LCx (25% mRCA and 70% oOM1 residual).  . CKD (chronic kidney disease) stage 3, GFR 30-59 ml/min (HCC)   . DM type 2 (diabetes mellitus, type 2) ([place])   . DVT, lower extremity, proximal, acute ([place])   . Dyslipidemia   . GERD (gastroesophageal reflux disease)   . Hearing impaired    bilateral hearing aids  . History of kidney stones   . History of skin cancer   . Hypertension   . Hypothyroidism   . Macular degeneration    injections done bilaterally recent - 08-22-13  . Normocytic anemia   . PAF (paroxysmal atrial fibrillation) ([place])   . Parkinson's disease ([place])   . Prostate cancer [facility])    Prostate cancer-radiation only,skin cancer-basal cell and last squamous cell right eye  . Statin intolerance   . Stroke ([place])   . Urgency-frequency syndrome   . Urticaria    Past Surgical History:  Procedure Laterality Date  . BACK SURGERY  lumbar fracture  . cataract surgery Bilateral   . COLONOSCOPY W/ POLYPECTOMY     x2   . CORONARY STENT INTERVENTION N/A 08/06/2017   Procedure: CORONARY STENT INTERVENTION;  Surgeon: Jettie Booze, MD;  Location: [facility];  Service: Cardiovascular;  Laterality: N/A;  . CYSTOSCOPY W/ URETERAL STENT PLACEMENT Right 08/13/2017   Procedure: CYSTOSCOPY WITH RIGHT  RETROGRADE PYELOGRAM/URETERAL STENT PLACEMENT;  Surgeon: Raynelle Bring, MD;  Location: [facility];  Service: Urology;  Laterality: Right;  . CYSTOSCOPY WITH INJECTION N/A 09/11/2013   Procedure: CYSTOSCOPY WITH BOTOX INJECTION INTO THE BLADDER;  Surgeon: Ailene Rud, MD;  Location: [facility];  Service: Urology;  Laterality: N/A;  . CYSTOSCOPY WITH RETROGRADE PYELOGRAM, URETEROSCOPY AND STENT PLACEMENT    .  CYSTOSCOPY WITH RETROGRADE PYELOGRAM, URETEROSCOPY AND STENT PLACEMENT Right 12/22/2018   Procedure: CYSTOSCOPY WITH RETROGRADE PYELOGRAM, URETEROSCOPY AND STENT PLACEMENT;  Surgeon: Cleon Gustin, MD;  [facility]: [facility];  Service: Urology;  Laterality: Right;  90 MINS  . CYSTOSCOPY WITH STENT PLACEMENT Right 01/26/2016   Procedure: CYSTOSCOPY, RIGHT RETROGRADE WITH RIGHT URETERAL STENT PLACEMENT;  Surgeon: Cleon Gustin, MD;  [facility];  Service: Urology;  Laterality: Right;  . CYSTOSCOPY/RETROGRADE/URETEROSCOPY Right 04/22/2016   Procedure: CYSTOSCOPY/RETROGRADE/ REMOVAL JJ STENT right insertion stent right insertion of foley;  Surgeon: Franchot Gallo, MD;  [facility];  Service: Urology;  Laterality: Right;  . EXTRACORPOREAL SHOCK WAVE LITHOTRIPSY Right 02/27/2016   Procedure: RIGHT EXTRACORPOREAL SHOCK WAVE LITHOTRIPSY (ESWL) GAITED;  Surgeon: Cleon Gustin, MD;  [facility];  Service: Urology;  Laterality: Right;  . EXTRACORPOREAL SHOCK WAVE LITHOTRIPSY Right 02/10/2016   Procedure: EXTRACORPOREAL SHOCK WAVE LITHOTRIPSY (ESWL);  Surgeon: Kathie Rhodes, MD;  [facility];  Service: Urology;  Laterality: Right;  [facility]-[phone] [id number] [employer]- [id number]   . EYE SURGERY     macular degeneration  . GALLBLADDER SURGERY  2006   no cholecystectomy  . HERNIA REPAIR    . HOLMIUM LASER APPLICATION Right 22/97/9892   Procedure: HOLMIUM LASER APPLICATION;  Surgeon: Cleon Gustin, MD;  [facility];  Service: Urology;  Laterality: Right;  . LEFT HEART CATH AND CORONARY ANGIOGRAPHY N/A 08/06/2017   Procedure: LEFT HEART CATH AND CORONARY ANGIOGRAPHY;  Surgeon: Jettie Booze, MD;  Location: [facility];  Service: Cardiovascular;  Laterality: N/A;  . LUNG REMOVAL, PARTIAL  age 13   for bronchiectasis  . PARTIAL THYMECTOMY  1985   for nodules  . POPLITEAL SYNOVIAL CYST EXCISION  2005  . TOTAL KNEE ARTHROPLASTY   2008   right  . TOTAL KNEE ARTHROPLASTY  2010   left     Current Meds  Medication Sig  . ACCU-CHEK GUIDE test strip   . alfuzosin (UROXATRAL) 10 MG 24 hr tablet Take 1 tablet (10 mg total) by mouth at bedtime.  . Alogliptin Benzoate 12.5 MG TABS Take 12.5 mg by mouth daily.   Marland Kitchen amiodarone (PACERONE) 200 MG tablet Take 1 tablet (200 mg total) by mouth daily.  Marland Kitchen apixaban (ELIQUIS) 2.5 MG TABS tablet Take 2.5 mg by mouth 2 (two) times daily.  . brimonidine-timolol (COMBIGAN) 0.2-0.5 % ophthalmic solution Place 1 drop into both eyes 2 (two) times daily.  . brinzolamide (AZOPT) 1 % ophthalmic suspension Place 1 drop into the right eye 3 (three) times daily.   . carbidopa-levodopa (SINEMET IR) 25-100 MG tablet Take 1 tablet by mouth 3 (three) times daily.   . Carboxymethylcellulose Sodium (  ARTIFICIAL TEARS OP) Place 1 drop into both eyes 4 (four) times daily.  . Cholecalciferol (VITAMIN D-3) 5000 units TABS Take 5,000 Units by mouth daily.  . clopidogrel (PLAVIX) 75 MG tablet Take 75 mg by mouth daily.   . colesevelam (WELCHOL) 625 MG tablet Take 1,875 mg by mouth every evening.   . Cyanocobalamin (VITAMIN B-12) 2500 MCG SUBL Place 1 tablet under the tongue daily.  Marland Kitchen diltiazem (CARDIZEM CD) 120 MG 24 hr capsule Take 1 capsule (120 mg total) by mouth daily for 30 days.  Marland Kitchen docusate sodium (COLACE) 100 MG capsule Take 100 mg by mouth 2 (two) times daily.   . fluticasone (FLONASE) 50 MCG/ACT nasal spray Place 2 sprays into both nostrils daily.  . furosemide (LASIX) 20 MG tablet Take by mouth.  . latanoprost (XALATAN) 0.005 % ophthalmic solution Place 1 drop into the right eye at bedtime.   Marland Kitchen levocetirizine (XYZAL) 5 MG tablet Take 2.5 mg by mouth at bedtime.   Marland Kitchen levothyroxine (SYNTHROID) 150 MCG tablet Take 150 mcg by mouth daily.   . Melatonin 3 MG SUBL Place 3 mg under the tongue at bedtime.  . Meth-Hyo-M Bl-Na Phos-Ph Sal (URO-MP) 118 MG CAPS Take 1 capsule by mouth 3 (three) times daily.  .  metoprolol succinate (TOPROL-XL) 25 MG 24 hr tablet Take 1 tablet (25 mg total) by mouth daily.  . mirabegron ER (MYRBETRIQ) 50 MG TB24 tablet Take 1 tablet (50 mg total) by mouth daily.  . Multiple Vitamins-Minerals (PRESERVISION/LUTEIN) CAPS Take 1 capsule by mouth 2 (two) times daily.  . pantoprazole (PROTONIX) 40 MG tablet Take 40 mg by mouth daily.  . polyethylene glycol (MIRALAX / GLYCOLAX) packet Take 17 g by mouth daily. [place]  . Potassium Chloride ER 20 MEQ TBCR Take 1 tablet by mouth daily.  . sertraline (ZOLOFT) 25 MG tablet Take 25 mg by mouth daily.     Allergies:   Colchicine, Hibiclens [chlorhexidine], Atorvastatin, Ceftin, Celebrex [celecoxib], Codeine, Erythromycin, Ezetimibe, Iodinated diagnostic agents, Oxycodone-acetaminophen, Rosuvastatin, Simvastatin, Cefuroxime, Celebrex [celecoxib], Codeine, Crestor [rosuvastatin], Erythromycin, Lipitor [atorvastatin], Nitroglycerin, Percocet [oxycodone-acetaminophen], Protonix [pantoprazole], Tetanus toxoids, Cefpodoxime, Cefuroxime axetil, Nitroglycerin, and Vantin   Social History   Tobacco Use  . Smoking status: Never Smoker  . Smokeless tobacco: Never Used  Substance Use Topics  . Alcohol use: No  . Drug use: No     Family Hx: The patient's family history includes Heart attack in his father; Heart disease in his father; Hypertension in his mother; Other in his sister; Stroke (age of onset: 52) in his mother. There is no history of Amblyopia, Blindness, Glaucoma, Macular degeneration, Retinal detachment, Strabismus, Retinitis pigmentosa, or Cataracts.  ROS:   Please see the history of present illness.     All other systems reviewed and are negative.   Labs/Other Tests and Data Reviewed:    Recent Labs: 08/05/2019: Hemoglobin 12.4; Magnesium 2.0; Platelets 173 03/12/2020: ALT <5; BUN 34; Creatinine, Ser 2.04; Potassium 4.3; Sodium 141; TSH 2.654   Recent Lipid Panel Lab Results  Component Value Date/Time   CHOL  165 03/11/2018 03:58 AM   CHOL 148 10/20/2017 08:47 AM   TRIG 65 03/11/2018 03:58 AM   HDL 40 (L) 03/11/2018 03:58 AM   HDL 46 10/20/2017 08:47 AM   CHOLHDL 4.1 03/11/2018 03:58 AM   LDLCALC 112 (H) 03/11/2018 03:58 AM   LDLCALC 90 10/20/2017 08:47 AM   LDLDIRECT 153.9 04/02/2011 08:05 AM    Wt Readings from Last 3 Encounters:  06/04/20 158 lb 6.4 oz (71.8 kg)  03/12/20 162 lb 12.8 oz (73.8 kg)  09/14/19 162 lb (73.5 kg)     Objective:    Vital Signs:  BP (!) 128/58   Pulse 62   Ht 5\' 10"  (1.778 m)   Wt 158 lb 6.4 oz (71.8 kg)   SpO2 99%   BMI 22.73 kg/m    GEN: Well nourished, well developed in no acute distress HEENT: Normal NECK: No JVD; No carotid bruits LYMPHATICS: No lymphadenopathy CARDIAC:RRR, no murmurs, rubs, gallops RESPIRATORY:  Clear to auscultation without rales, wheezing or rhonchi  ABDOMEN: Soft, non-tender, non-distended MUSCULOSKELETAL:  No edema; No deformity  SKIN: Warm and dry NEUROLOGIC:  Alert and oriented x 3 PSYCHIATRIC:  Normal affect    ASSESSMENT & PLAN:    1.  ASCAD -s/p STEMI with PCI of LCx 07/2017.  -He is no longer on DAPT due to need for DOAC for PAF.   -He has not had any further CP since his last admission with afib with RVR.   -He continues to ride his exercise bike and has no angina -He will continue on Plavix 75mg  daily and BB.   -no ASA due to DOAC  2.  HTN  -BP is well controlled on exam today -continue Toprol and cardizem  3.  Paroxysmal atrial fibrillation  - he has a CHADS2VASC score of 8 and is on Eliquis 2.5mg  BID (age>80 and Creatinine >1.5). -he is maintaining NSR and has not had any palpitations recently -Continue prescription drug management with Toprol XL 25mg  daily, Cardizem CD 120mg  daily, Amio 200mg  daily and Eliquis 2.5mg  BID. -I have personally reviewed and interpreted outside labs performed by patient's PCP which showed SCr 2.04 and K+ 4.3 from 03/2020 and Hbg 12.4 from 07/2019  -check TSH and ALT in June  as well as Chest xray -he sees an ophthalmologist every 6 months  4.  Hyperlipidemia  -his LDL goal is < 70.   -check FLp and ALT -He is statin intolerant  5.  CKD stage 3  -Last SCr 2 -This is followed by he PCP.   Medication Adjustments/Labs and Tests Ordered: Current medicines are reviewed at length with the patient today.  Concerns regarding medicines are outlined above.  Tests Ordered: No orders of the defined types were placed in this encounter.  Medication Changes: No orders of the defined types were placed in this encounter.   Disposition:  Follow up in 6 month(s)  Signed, Fransico Him, MD  06/04/2020 9:59 AM    [facility]

## 2020-06-04 NOTE — Patient Instructions (Signed)
Medication Instructions:  Your physician recommends that you continue on your current medications as directed. Please refer to the Current Medication list given to you today.  *If you need a refill on your cardiac medications before your next appointment, please call your pharmacy*   Lab Work: Fasting lipids, ALT and TSH on June 14th between 7:30am and 4:30pm at [address]   Testing/Procedures: A chest x-ray takes a picture of the organs and structures inside the chest, including the heart, lungs, and blood vessels. This test can show several things, including, whether the heart is enlarges; whether fluid is building up in the lungs; and whether pacemaker / defibrillator leads are still in place. >Please go to [facility] at [facility] Between the hours of 8am and 5pm Monday through Friday to have chest x-ray done   Follow-Up: At [facility], you and your health needs are our priority.  As part of our continuing mission to provide you with exceptional heart care, we have created designated Provider Care Teams.  These Care Teams include your primary Cardiologist (physician) and Advanced Practice Providers (APPs -  Physician Assistants and Nurse Practitioners) who all work together to provide you with the care you need, when you need it.  Your next appointment:   6 month(s)  The format for your next appointment:   In Person  Provider:   You may see Fransico Him, MD or one of the following Advanced Practice Providers on your designated Care Team:    Melina Copa, PA-C  Ermalinda Barrios, PA-C

## 2020-06-04 NOTE — Addendum Note (Signed)
Addended by: Antonieta Iba on: 06/04/2020 10:18 AM   Modules accepted: Orders

## 2020-06-25 ENCOUNTER — Other Ambulatory Visit: Payer: Medicare Other | Admitting: *Deleted

## 2020-06-25 ENCOUNTER — Other Ambulatory Visit: Payer: Self-pay

## 2020-06-25 ENCOUNTER — Ambulatory Visit
Admission: RE | Admit: 2020-06-25 | Discharge: 2020-06-25 | Disposition: A | Discharge status: Home / Self Care | Payer: Medicare Other | Source: Ambulatory Visit | Attending: Cardiology | Admitting: Cardiology

## 2020-06-25 DIAGNOSIS — N1831 Chronic kidney disease, stage 3a: Secondary | ICD-10-CM

## 2020-06-25 DIAGNOSIS — I2510 Atherosclerotic heart disease of native coronary artery without angina pectoris: Secondary | ICD-10-CM

## 2020-06-25 DIAGNOSIS — I480 Paroxysmal atrial fibrillation: Secondary | ICD-10-CM

## 2020-06-25 DIAGNOSIS — E785 Hyperlipidemia, unspecified: Secondary | ICD-10-CM

## 2020-06-25 DIAGNOSIS — I10 Essential (primary) hypertension: Secondary | ICD-10-CM

## 2020-06-25 LAB — LIPID PANEL
Chol/HDL Ratio: 4.6 ratio (ref 0.0–5.0)
Cholesterol, Total: 195 mg/dL (ref 100–199)
HDL: 42 mg/dL (ref >39)
LDL Chol Calc (NIH): 133 mg/dL — ABNORMAL HIGH (ref 0–99)
Triglycerides: 108 mg/dL (ref 0–149)
VLDL Cholesterol Cal: 20 mg/dL (ref 5–40)

## 2020-06-25 LAB — TSH: TSH: 1.91 u[IU]/mL (ref 0.450–4.500)

## 2020-06-25 LAB — ALT: ALT: 5 IU/L (ref 0–44)

## 2020-07-04 ENCOUNTER — Encounter: Payer: Self-pay | Admitting: Podiatry

## 2020-07-04 ENCOUNTER — Other Ambulatory Visit: Payer: Self-pay

## 2020-07-04 ENCOUNTER — Ambulatory Visit (INDEPENDENT_AMBULATORY_CARE_PROVIDER_SITE_OTHER): Payer: Medicare Other | Admitting: Podiatry

## 2020-07-04 DIAGNOSIS — M79674 Pain in right toe(s): Secondary | ICD-10-CM | POA: Diagnosis not present

## 2020-07-04 DIAGNOSIS — Q828 Other specified congenital malformations of skin: Secondary | ICD-10-CM

## 2020-07-04 DIAGNOSIS — D689 Coagulation defect, unspecified: Secondary | ICD-10-CM

## 2020-07-04 DIAGNOSIS — M79675 Pain in left toe(s): Secondary | ICD-10-CM

## 2020-07-04 DIAGNOSIS — B351 Tinea unguium: Secondary | ICD-10-CM | POA: Diagnosis not present

## 2020-07-04 NOTE — Patient Instructions (Signed)

## 2020-07-05 NOTE — Progress Notes (Signed)
Subjective:   Patient ID: [name], male   DOB: 87 y.o.   MRN: [id number]   HPI Patient presents with chronic nail disease of both feet 1 through 5 and lesions plantar aspect both feet that are sore with patient on blood thinner and at high risk of taking care of this   ROS      Objective:  Physical Exam  Neurovascular status unchanged with patient found to have thick dystrophic nailbeds 1-5 both feet that he cannot cut and lesions plantar both feet keratotic and painful with pressure     Assessment:  Chronic mycotic nail infections 1-5 both feet with pain and lesions bilateral painful with patient on blood thinner     Plan:  Debride painful nailbeds 1-5 both feet and lesions bilateral with no iatrogenic bleeding reappoint routine care

## 2020-07-08 ENCOUNTER — Non-Acute Institutional Stay: Payer: Medicare Other | Admitting: Nurse Practitioner

## 2020-07-08 ENCOUNTER — Other Ambulatory Visit: Payer: Self-pay

## 2020-07-08 DIAGNOSIS — R634 Abnormal weight loss: Secondary | ICD-10-CM

## 2020-07-08 DIAGNOSIS — R531 Weakness: Secondary | ICD-10-CM

## 2020-07-08 DIAGNOSIS — C61 Malignant neoplasm of prostate: Secondary | ICD-10-CM

## 2020-07-08 NOTE — Progress Notes (Signed)
[facility] Consult Note Telephone: [phone]  Fax: [phone]    Date of encounter: 07/08/20 PATIENT NAME: [name] [address] [place] [id number]   [id number] (home)  DOB: [birth year] MRN: [id number]  PRIMARY CARE PROVIDER:    Delle Reining   REFERRING PROVIDER:   Delle Reining   RESPONSIBLE PARTY:    Contact Information     Name Relation Home Work Mobile   Heath,Frankie Sister [phone]     [facility] Relative [phone]     Kevan Rosebush Sister   [phone]     I met face to face with patient facility.  Palliative Care was asked to follow this patient by consultation request of  Delle Reining to address advance care planning and complex medical decision making. This is a follow up visit.                                  ASSESSMENT AND PLAN / RECOMMENDATIONS:   Advance Care Planning/Goals of Care:  CODE STATUS: DNR Goal of care: Goal of care is comfort while preserving function. Directives: Patient code status is DNR. Signed DNR form on chart and on [place] EMR.   Symptom Management/Plan: Weight loss: Patient had 2.5% weight loss in the last 6 months. Encouraged adequate oral intake. Encourage snacking between meals. May consider offering nutritional supplement like Ensure between meals to augment calorie intake. May also consider appetite stimulant to improve appetite if worsening symptoms. Generalized weakness: Recommend PT/OT for strengthening and balance. Maintain safety, fall precautions per facility protocol. Encourage consistent use of assistive device during ambulation. Prostate cancer: No report of uncontrolled pain. Denied acute urinary symptoms. CAD: Tolerating anticoagulation with Eliquis without report of acute bleed. Denied chest pain. Last with visit with cardiology was on 06/04/2020 Provided general support and encouragement.  Visit update discussed with patient's sister  [name]. She voiced no concerns at this time.  Follow up Palliative Care Visit: Palliative care will continue to follow for complex medical decision making, advance care planning, and clarification of goals. Return as needed.  PPS: 50%  HOSPICE ELIGIBILITY/DIAGNOSIS: TBD  CHIEF COMPLAIN: weight loss  History obtained from review of Epic EMR, discussion with facility staff, and interview with Mr. [name].   HISTORY OF PRESENT ILLNESS:  [name] is a 87 y.o. year old male with multiple medical problems including CKD stage III, DM type II, DVTs, GERD, hypothyroidism, hypertension, paroxysmal A. Fib (on Eliquis), prostate cancer. Patient with report of weight loss. Weight 160lbs down from 164lbs six months ago and 166lb eight months ago. Weight loss is related to decreased meal intake. Patient denied chewing or swallowing issues. Staff report patient with some decline in physical function, report unsteady gait. Patient currently ambulates with a front wheel walker. Requires moderate assist with his ADLs. He denied  fever, chills, increased SOB, chest pain, or uncontrolled pain.  Ten systems reviewed and are negative for acute change, except as noted in the HPI.   I reviewed available labs, medications, imaging, studies and related documents from the EMR.  Records reviewed and summarized above.   Physical Exam: Current and past weights: General: frail appearing, sitting in recliner in his room in NAD EYES: anicteric sclera, no discharge  ENMT: Hearing aid in, oral mucous membranes moist CV: S1S2 normal, no LE edema Pulmonary: LCTA, no increased work of breathing, no cough, room air  Abdomen: no ascites GU: deferred MSK: sarcopenia, moves all extremities, ambulatory Skin: warm and dry, no rashes or wounds on visible skin Neuro:  generalized weakness,  mild cognitive impairment Psych: non-anxious affect, A and O x 3 Hem/lymph/immuno: no widespread bruising  Past Medical History:   Diagnosis Date   Allergic rhinitis    Arthritis    arthritis-kyphosis   Asthmatic bronchitis    CAD in native artery    a. STEMI 07/2017 s/p DES to LCx (25% mRCA and 70% oOM1 residual).   CKD (chronic kidney disease) stage 3, GFR 30-59 ml/min (HCC)    DM type 2 (diabetes mellitus, type 2) (HCC)    DVT, lower extremity, proximal, acute (HCC)    Dyslipidemia    GERD (gastroesophageal reflux disease)    Hearing impaired    bilateral hearing aids   History of kidney stones    History of skin cancer    Hypertension    Hypothyroidism    Macular degeneration    injections done bilaterally recent - 08-22-13   Normocytic anemia    PAF (paroxysmal atrial fibrillation) (HCC)    Parkinson's disease ([place])    Prostate cancer ([place])    Prostate cancer-radiation only,skin cancer-basal cell and last squamous cell right eye   Statin intolerance    Stroke [facility])    Urgency-frequency syndrome    Urticaria     Thank you for the opportunity to participate in the care of Mr. Bunch.  The palliative care team will continue to follow. Please call our office at [phone] if we can be of additional assistance.   Jari Favre, DNP, AGPCNP-BC  COVID-19 PATIENT SCREENING TOOL Asked and negative response unless otherwise noted:   Have you had symptoms of covid, tested positive or been in contact with someone with symptoms/positive test in the past 5-10 days?

## 2020-07-18 ENCOUNTER — Telehealth: Payer: Self-pay | Admitting: Cardiology

## 2020-07-18 NOTE — Telephone Encounter (Signed)
[facility] [place] states they received a letter for the patient stating they had been trying to contact him about his lab results. She requests the results be faxed to them at: [phone]

## 2020-09-12 ENCOUNTER — Encounter (HOSPITAL_COMMUNITY): Payer: Self-pay | Admitting: Physician Assistant

## 2020-09-12 ENCOUNTER — Other Ambulatory Visit: Payer: Self-pay

## 2020-09-12 ENCOUNTER — Ambulatory Visit (HOSPITAL_COMMUNITY)
Admission: RE | Admit: 2020-09-12 | Discharge: 2020-09-12 | Disposition: A | Discharge status: Home / Self Care | Payer: Medicare Other | Source: Ambulatory Visit | Attending: Physician Assistant | Admitting: Physician Assistant

## 2020-09-12 VITALS — BP 134/84 | HR 56 | Ht 70.0 in | Wt 158.0 lb

## 2020-09-12 DIAGNOSIS — I129 Hypertensive chronic kidney disease with stage 1 through stage 4 chronic kidney disease, or unspecified chronic kidney disease: Secondary | ICD-10-CM | POA: Insufficient documentation

## 2020-09-12 DIAGNOSIS — I2510 Atherosclerotic heart disease of native coronary artery without angina pectoris: Secondary | ICD-10-CM | POA: Diagnosis not present

## 2020-09-12 DIAGNOSIS — Z8249 Family history of ischemic heart disease and other diseases of the circulatory system: Secondary | ICD-10-CM | POA: Insufficient documentation

## 2020-09-12 DIAGNOSIS — Z79899 Other long term (current) drug therapy: Secondary | ICD-10-CM | POA: Diagnosis not present

## 2020-09-12 DIAGNOSIS — I350 Nonrheumatic aortic (valve) stenosis: Secondary | ICD-10-CM | POA: Diagnosis not present

## 2020-09-12 DIAGNOSIS — Z955 Presence of coronary angioplasty implant and graft: Secondary | ICD-10-CM | POA: Insufficient documentation

## 2020-09-12 DIAGNOSIS — E1122 Type 2 diabetes mellitus with diabetic chronic kidney disease: Secondary | ICD-10-CM | POA: Diagnosis not present

## 2020-09-12 DIAGNOSIS — D6869 Other thrombophilia: Secondary | ICD-10-CM | POA: Diagnosis not present

## 2020-09-12 DIAGNOSIS — I440 Atrioventricular block, first degree: Secondary | ICD-10-CM | POA: Diagnosis not present

## 2020-09-12 DIAGNOSIS — Z7902 Long term (current) use of antithrombotics/antiplatelets: Secondary | ICD-10-CM | POA: Diagnosis not present

## 2020-09-12 DIAGNOSIS — I252 Old myocardial infarction: Secondary | ICD-10-CM | POA: Diagnosis not present

## 2020-09-12 DIAGNOSIS — I480 Paroxysmal atrial fibrillation: Secondary | ICD-10-CM | POA: Diagnosis not present

## 2020-09-12 DIAGNOSIS — Z888 Allergy status to other drugs, medicaments and biological substances status: Secondary | ICD-10-CM | POA: Diagnosis not present

## 2020-09-12 DIAGNOSIS — Z7901 Long term (current) use of anticoagulants: Secondary | ICD-10-CM | POA: Diagnosis not present

## 2020-09-12 DIAGNOSIS — N183 Chronic kidney disease, stage 3 unspecified: Secondary | ICD-10-CM | POA: Insufficient documentation

## 2020-09-12 DIAGNOSIS — I10 Essential (primary) hypertension: Secondary | ICD-10-CM | POA: Diagnosis not present

## 2020-09-12 NOTE — Progress Notes (Signed)
Primary Care Physician: Patient, No Pcp Per (Inactive) Primary Cardiologist: Dr Radford Pax Primary Electrophysiologist: none Referring Physician: [facility]   [name] is a 87 y.o. male with a history of CKD stage III, DM type II, DVTs, GERD, hypothyroidism, hypertension, paroxysmal A. fib, prostate cancer who presents for follow up in the [facility]. Patient is on Eliquis for a CHADS2VASC score of 7. Patient has been to the ED several times recently for afib with RVR. Most recently he was discharged on 08/05/19 after being admitted for near-syncope and found to be in afib with RVR. He was loaded on amiodarone for rhythm control. He converted to SR on the medication.   On follow up today, patient reports that he has done well since his last visit. He denies any heart racing or palpitations. He stays active doing a stationary bike and some weight lifting. He denies any bleeding issues on anticoagulation.   Today, he denies symptoms of palpitations, chest pain, shortness of breath, orthopnea, PND, lower extremity edema, dizziness, presyncope, syncope, snoring, daytime somnolence, bleeding, or neurologic sequela. The patient is tolerating medications without difficulties and is otherwise without complaint today.    Atrial Fibrillation Risk Factors:  he does not have symptoms or diagnosis of sleep apnea. he does not have a history of rheumatic fever. he does not have a history of alcohol use.   he has a BMI of Body mass index is 22.67 kg/m.Marland Kitchen Filed Weights   09/12/20 1100  Weight: 71.7 kg     Family History  Problem Relation Age of Onset   Other Sister        ophth disease   Heart attack Father        x7   Heart disease Father        MI x7   Hypertension Mother    Stroke Mother 80   Amblyopia Neg Hx    Blindness Neg Hx    Glaucoma Neg Hx    Macular degeneration Neg Hx    Retinal detachment Neg Hx    Strabismus Neg Hx    Retinitis pigmentosa  Neg Hx    Cataracts Neg Hx      Atrial Fibrillation Management history:  Previous antiarrhythmic drugs: amiodarone Previous cardioversions: none Previous ablations: none CHADS2VASC score: 7 Anticoagulation history: Eliquis    Past Medical History:  Diagnosis Date   Allergic rhinitis    Arthritis    arthritis-kyphosis   Asthmatic bronchitis    CAD in native artery    a. STEMI 07/2017 s/p DES to LCx (25% mRCA and 70% oOM1 residual).   CKD (chronic kidney disease) stage 3, GFR 30-59 ml/min (HCC)    DM type 2 (diabetes mellitus, type 2) (HCC)    DVT, lower extremity, proximal, acute (HCC)    Dyslipidemia    GERD (gastroesophageal reflux disease)    Hearing impaired    bilateral hearing aids   History of kidney stones    History of skin cancer    Hypertension    Hypothyroidism    Macular degeneration    injections done bilaterally recent - 08-22-13   Normocytic anemia    PAF (paroxysmal atrial fibrillation) (HCC)    Parkinson's disease ([place])    Prostate cancer ([place])    Prostate cancer-radiation only,skin cancer-basal cell and last squamous cell right eye   Statin intolerance    Stroke [facility])    Urgency-frequency syndrome    Urticaria    Past Surgical  History:  Procedure Laterality Date   BACK SURGERY     lumbar fracture   cataract surgery Bilateral    COLONOSCOPY W/ POLYPECTOMY     x2    CORONARY STENT INTERVENTION N/A 08/06/2017   Procedure: CORONARY STENT INTERVENTION;  Surgeon: Jettie Booze, MD;  Location: [place] [facility];  Service: Cardiovascular;  Laterality: N/A;   CYSTOSCOPY W/ URETERAL STENT PLACEMENT Right 08/13/2017   Procedure: CYSTOSCOPY WITH RIGHT  RETROGRADE PYELOGRAM/URETERAL STENT PLACEMENT;  Surgeon: Raynelle Bring, MD;  Location: [facility];  Service: Urology;  Laterality: Right;   CYSTOSCOPY WITH INJECTION N/A 09/11/2013   Procedure: CYSTOSCOPY WITH BOTOX INJECTION INTO THE BLADDER;  Surgeon: Ailene Rud, MD;  Location: [facility];  Service:  Urology;  Laterality: N/A;   CYSTOSCOPY WITH RETROGRADE PYELOGRAM, URETEROSCOPY AND STENT PLACEMENT     CYSTOSCOPY WITH RETROGRADE PYELOGRAM, URETEROSCOPY AND STENT PLACEMENT Right 12/22/2018   Procedure: CYSTOSCOPY WITH RETROGRADE PYELOGRAM, URETEROSCOPY AND STENT PLACEMENT;  Surgeon: Cleon Gustin, MD;  Location: [facility];  Service: Urology;  Laterality: Right;  90 MINS   CYSTOSCOPY WITH STENT PLACEMENT Right 01/26/2016   Procedure: CYSTOSCOPY, RIGHT RETROGRADE WITH RIGHT URETERAL STENT PLACEMENT;  Surgeon: Cleon Gustin, MD;  Location: [facility];  Service: Urology;  Laterality: Right;   CYSTOSCOPY/RETROGRADE/URETEROSCOPY Right 04/22/2016   Procedure: CYSTOSCOPY/RETROGRADE/ REMOVAL JJ STENT right insertion stent right insertion of foley;  Surgeon: Franchot Gallo, MD;  Location: [facility];  Service: Urology;  Laterality: Right;   EXTRACORPOREAL SHOCK WAVE LITHOTRIPSY Right 02/27/2016   Procedure: RIGHT EXTRACORPOREAL SHOCK WAVE LITHOTRIPSY (ESWL) GAITED;  Surgeon: Cleon Gustin, MD;  Location: [facility];  Service: Urology;  Laterality: Right;   EXTRACORPOREAL SHOCK WAVE LITHOTRIPSY Right 02/10/2016   Procedure: EXTRACORPOREAL SHOCK WAVE LITHOTRIPSY (ESWL);  Surgeon: Kathie Rhodes, MD;  Location: [facility];  Service: Urology;  Laterality: Right;  [facility] [phone] [place] [employer]- [id number]    EYE SURGERY     macular degeneration   GALLBLADDER SURGERY  2006   no cholecystectomy   HERNIA REPAIR     HOLMIUM LASER APPLICATION Right [id number]   Procedure: HOLMIUM LASER APPLICATION;  Surgeon: Cleon Gustin, MD;  Location: [facility];  Service: Urology;  Laterality: Right;   LEFT HEART CATH AND CORONARY ANGIOGRAPHY N/A 08/06/2017   Procedure: LEFT HEART CATH AND CORONARY ANGIOGRAPHY;  Surgeon: Jettie Booze, MD;  Location: [place] [facility];  Service: Cardiovascular;  Laterality: N/A;   LUNG REMOVAL, PARTIAL  age 18   for bronchiectasis   PARTIAL THYMECTOMY   1985   for nodules   POPLITEAL SYNOVIAL CYST EXCISION  2005   TOTAL KNEE ARTHROPLASTY  2008   right   TOTAL KNEE ARTHROPLASTY  2010   left    Current Outpatient Medications  Medication Sig Dispense Refill   ACCU-CHEK GUIDE test strip      alfuzosin (UROXATRAL) 10 MG 24 hr tablet Take 1 tablet (10 mg total) by mouth at bedtime. 30 tablet 0   Alogliptin Benzoate 12.5 MG TABS Take 12.5 mg by mouth daily.      amiodarone (PACERONE) 200 MG tablet Take 1 tablet (200 mg total) by mouth daily.     apixaban (ELIQUIS) 2.5 MG TABS tablet Take 2.5 mg by mouth 2 (two) times daily.     brimonidine-timolol (COMBIGAN) 0.2-0.5 % ophthalmic solution Place 1 drop into both eyes 2 (two) times daily.     brinzolamide (AZOPT) 1 % ophthalmic suspension Place 1 drop into the right  eye 3 (three) times daily.      carbidopa-levodopa (SINEMET IR) 25-100 MG tablet Take 1 tablet by mouth 3 (three) times daily.      Carboxymethylcellulose Sodium (ARTIFICIAL TEARS OP) Place 1 drop into both eyes 4 (four) times daily.     Cholecalciferol (VITAMIN D-3) 5000 units TABS Take 5,000 Units by mouth daily.     clopidogrel (PLAVIX) 75 MG tablet Take 75 mg by mouth daily.      colesevelam (WELCHOL) 625 MG tablet Take 1,875 mg by mouth every evening.      Cyanocobalamin (VITAMIN B-12) 2500 MCG SUBL Place 1 tablet under the tongue daily.     diltiazem (CARDIZEM CD) 120 MG 24 hr capsule Take 1 capsule (120 mg total) by mouth daily for 30 days. 30 capsule 0   docusate sodium (COLACE) 100 MG capsule Take 100 mg by mouth 2 (two) times daily.      fluticasone (FLONASE) 50 MCG/ACT nasal spray Place 2 sprays into both nostrils daily.     furosemide (LASIX) 20 MG tablet Take by mouth.     latanoprost (XALATAN) 0.005 % ophthalmic solution Place 1 drop into the right eye at bedtime.      levocetirizine (XYZAL) 5 MG tablet Take 2.5 mg by mouth at bedtime.      levothyroxine (SYNTHROID) 150 MCG tablet Take 150 mcg by mouth daily.       Melatonin 3 MG SUBL Place 3 mg under the tongue at bedtime.     Meth-Hyo-M Bl-Na Phos-Ph Sal (URO-MP) 118 MG CAPS Take 1 capsule by mouth 3 (three) times daily.     metoprolol succinate (TOPROL-XL) 25 MG 24 hr tablet Take 1 tablet (25 mg total) by mouth daily.     mirabegron ER (MYRBETRIQ) 50 MG TB24 tablet Take 1 tablet (50 mg total) by mouth daily. 30 tablet 0   Multiple Vitamins-Minerals (PRESERVISION/LUTEIN) CAPS Take 1 capsule by mouth 2 (two) times daily.     pantoprazole (PROTONIX) 40 MG tablet Take 40 mg by mouth daily.     polyethylene glycol (MIRALAX / GLYCOLAX) packet Take 17 g by mouth daily. MIX AND DRINK     Potassium Chloride ER 20 MEQ TBCR Take 1 tablet by mouth daily.     sertraline (ZOLOFT) 25 MG tablet Take 25 mg by mouth daily.     traMADol (ULTRAM) 50 MG tablet Take 50 mg by mouth every 6 (six) hours as needed.     ferrous sulfate 325 (65 FE) MG EC tablet Take 1 tablet (325 mg total) by mouth 2 (two) times daily with a meal. 60 tablet 11   No current facility-administered medications for this encounter.    Allergies  Allergen Reactions   Colchicine Anaphylaxis   Hibiclens [Chlorhexidine] Hives   Atorvastatin Other (See Comments)    Joint pain    Ceftin Diarrhea   Celebrex [Celecoxib] Swelling   Codeine Hives   Erythromycin Diarrhea   Ezetimibe Other (See Comments)    Joint pain    Iodinated Diagnostic Agents Nausea And Vomiting and Rash   Oxycodone-Acetaminophen Hives   Rosuvastatin Other (See Comments)    Joint pain    Simvastatin Other (See Comments)    Joint pain    Cefuroxime    Celebrex [Celecoxib]    Codeine    Crestor [Rosuvastatin]    Erythromycin    Lipitor [Atorvastatin]    Nitroglycerin    Percocet [Oxycodone-Acetaminophen]    Protonix [Pantoprazole]    Tetanus Toxoids  Cefpodoxime Rash   Cefuroxime Axetil Rash   Nitroglycerin Other (See Comments)    Lowers patient's B/P   Vantin Other (See Comments)    Unknown/not noted on MAR??     Social History   Socioeconomic History   Marital status: Married    Spouse name: Not on file   Number of children: 2   Years of education: Not on file   Highest education level: Not on file  Occupational History   Occupation: retired  Tobacco Use   Smoking status: Never   Smokeless tobacco: Never  Substance and Sexual Activity   Alcohol use: No   Drug use: No   Sexual activity: Not Currently  Other Topics Concern   Not on file  Social History Narrative   ** Merged History Encounter **       MUST HAVE MEDS LISTED DO NOT SUBSTITUTE GENERICS PATIENT IS ALLERGIC TO DYES   Social Determinants of Health   Financial Resource Strain: Not on file  Food Insecurity: Not on file  Transportation Needs: Not on file  Physical Activity: Not on file  Stress: Not on file  Social Connections: Not on file  Intimate Partner Violence: Not on file     ROS- All systems are reviewed and negative except as per the HPI above.  Physical Exam: Vitals:   09/12/20 1100  BP: 134/84  Pulse: (!) 56  Weight: 71.7 kg  Height: '5\' 10"'$  (1.778 m)    GEN- The patient is a well appearing elderly male, alert and oriented x 3 today.   HEENT-head normocephalic, atraumatic, sclera clear, conjunctiva pink, hearing intact, trachea midline. Lungs- Clear to ausculation bilaterally, normal work of breathing Heart- Regular rate and rhythm, no murmurs, rubs or gallops  GI- soft, NT, ND, + BS Extremities- no clubbing, cyanosis, or edema MS- no significant deformity or atrophy Skin- no rash or lesion Psych- euthymic mood, full affect Neuro- strength and sensation are intact   Wt Readings from Last 3 Encounters:  09/12/20 71.7 kg  06/04/20 71.8 kg  03/12/20 73.8 kg    EKG today demonstrates  SB, 1st degree AV block Vent. rate 56 BPM PR interval 274 ms QRS duration 94 ms QT/QTcB 458/441 ms  Echo 07/12/19 demonstrated  1. Mild concentric LVH with moderate hypertrophy of the basal septum. Left  ventricular ejection fraction, by estimation, is 55 to 60%. The left ventricle has normal function. The left ventricle has no regional wall motion abnormalities. There is mild concentric left ventricular hypertrophy. Left ventricular diastolic parameters are consistent with Grade II diastolic dysfunction (pseudonormalization).   2. Right ventricular systolic function is normal. The right ventricular  size is normal. There is mildly elevated pulmonary artery systolic  pressure.   3. Left atrial size was moderately dilated.   4. The mitral valve is normal in structure. Trivial mitral valve  regurgitation. No evidence of mitral stenosis.   5. The aortic valve is tricuspid. Aortic valve regurgitation is mild. No  aortic stenosis is present.   6. The inferior vena cava is dilated in size with <50% respiratory  variability, suggesting right atrial pressure of 15 mmHg.   Epic records are reviewed at length today  CHA2DS2-VASc Score = 7  The patient's score is based upon: CHF History: No HTN History: Yes Diabetes History: Yes Stroke History: Yes Vascular Disease History: Yes Age Score: 2 Gender Score: 0      ASSESSMENT AND PLAN: 1. Paroxysmal Atrial Fibrillation (ICD10:  I48.0) The patient's CHA2DS2-VASc score  is 7, indicating a 11.2% annual risk of stroke.   Patient appears to be maintaining SR. Continue amiodarone 200 mg daily. Recent labs reviewed.  Continue Eliquis 2.5 mg BID (age, Cr above 1.5)  2. Secondary Hypercoagulable State (ICD10:  D68.69) The patient is at significant risk for stroke/thromboembolism based upon his CHA2DS2-VASc Score of 7.  Continue Apixaban (Eliquis).   3. CAD S/p STEMI 2019. No anginal symptoms.  4. HTN Stable, no changes today.   Follow up with Dr Radford Pax as scheduled. AF clinic in one year.    [facility] [address] [place], [place] [address] [phone] 09/12/2020 11:25 AM

## 2020-11-06 ENCOUNTER — Non-Acute Institutional Stay: Payer: Medicare Other

## 2020-11-06 DIAGNOSIS — Z515 Encounter for palliative care: Secondary | ICD-10-CM

## 2020-11-06 NOTE — Progress Notes (Signed)
COMMUNITY PALLIATIVE CARE SW NOTE  PATIENT NAME: [name] DOB: [birth year] MRN: [id number]  PRIMARY CARE PROVIDER: Patient, No Pcp Per (Inactive)  RESPONSIBLE PARTY:  Acct ID - Guarantor Home Phone Work Phone Relationship Acct Type  [id number] - Broadus,JOH* [phone]  Self P/F     [address], [place], [place] [address]     PLAN OF CARE and INTERVENTIONS:             GOALS OF CARE/ ADVANCE CARE PLANNING:  Patient is a DNR. Patient's goals include: to remain at ALF, to maximize quality of life and symptom management and avoid hospitalizations while preserving functioning.  2.         SOCIAL/EMOTIONAL/SPIRITUAL ASSESSMENT/ INTERVENTIONS:  SW completed in person PC visit with patient. Patient resides at [facility].  Patient was in his room watching TV in is lift chair at time of visit. Patient shared that he has some pain in his R shoulder due to slide/fall out of bed that he had a couple weeks ago. Patient states that he has not taken anything for the pain. Patient is typically independent with all ADL's including making his bed in the AM however due to his shoulder pain this has become difficult. Patient shared that he uses his RW around his room and rides his motorized scooter around facility.  Patient shares his appetite is the same, per last NP note patients appetite was poor. Patient states he eats when he is hungry. Patient eats 2 meals a day (breakfast and dinner) and may snack in between. Per staff patients current weight is 158.2. Patient states his fluid intake is poor, as he does not like drinking water. Patient states the drinks 1c of juice at breakfast and 160z of soda at dinner.  Patient share that she sleeps well.  Patient shares that he has some coughing with eating during meals and shares that his doctor prescribed him a medication to assist with the coughing. Patient has some swelling to bilateral lower legs and was wearing compression stockings during  visit. Patient denied SHOB, chest pains, coughing (outside of eating) or fatigueness. SW encouraged patient to elevate legs as much as possible.  Patient takes Lasix to assist with fluid retention. SW made med tech aware of patients concern with his shoulder, med tech shared that she will make attending MD aware.   Psychosocial assessment completed. No needs identified at this time. Patient enjoys watching TV and attending [employer].   Palliative care will continue to follow for symptom management and disease progression.  3.         PATIENT/CAREGIVER EDUCATION/ COPING:  Patient A&O x3, able to engage in conversation. Patient is HOH and wears hearing and glasses. Patient in good spirits today. Patient shared that she has a lot of friends at facility and is happy at facility. PHQ 9 completed; result of 2 indicating minimal depression, no treatment needs indicated at this time. Patient's family/sister is supportive  4.         PERSONAL EMERGENCY PLAN:  Patient to follow facilities emergency plan.  5.         COMMUNITY RESOURCES COORDINATION/ HEALTH CARE NAVIGATION:  facility manages care  6.         FINANCIAL/LEGAL CONCERNS/INTERVENTIONS:  None        SOCIAL HX:  Social History   Tobacco Use   Smoking status: Never   Smokeless tobacco: Never  Substance Use Topics   Alcohol use: No  CODE STATUS: DNR ADVANCED DIRECTIVES: N MOST FORM COMPLETE: Y HOSPICE EDUCATION PROVIDED: N  PPS: Patient is MIN A with all ADL's, facility assist with needs and provides meals. Patient is A&O x3 to with average insight and judgement.   Time spent: 30 min      [place], [place]

## 2020-11-07 ENCOUNTER — Other Ambulatory Visit: Payer: Self-pay

## 2020-12-09 ENCOUNTER — Ambulatory Visit: Payer: Medicare Other | Admitting: Cardiology

## 2020-12-09 ENCOUNTER — Other Ambulatory Visit: Payer: Self-pay

## 2021-03-20 ENCOUNTER — Other Ambulatory Visit: Payer: Self-pay

## 2021-03-20 ENCOUNTER — Inpatient Hospital Stay (HOSPITAL_COMMUNITY)
Admission: EM | Admit: 2021-03-20 | Discharge: 2021-03-24 | DRG: 193 | Disposition: A | Discharge status: Home Health | Payer: Medicare Other | Source: Skilled Nursing Facility | Attending: Internal Medicine | Admitting: Internal Medicine

## 2021-03-20 ENCOUNTER — Emergency Department (HOSPITAL_COMMUNITY): Payer: Medicare Other

## 2021-03-20 ENCOUNTER — Encounter (HOSPITAL_COMMUNITY): Payer: Self-pay | Admitting: Emergency Medicine

## 2021-03-20 DIAGNOSIS — Z974 Presence of external hearing-aid: Secondary | ICD-10-CM

## 2021-03-20 DIAGNOSIS — H353290 Exudative age-related macular degeneration, unspecified eye, stage unspecified: Secondary | ICD-10-CM | POA: Diagnosis present

## 2021-03-20 DIAGNOSIS — Z85828 Personal history of other malignant neoplasm of skin: Secondary | ICD-10-CM

## 2021-03-20 DIAGNOSIS — I2510 Atherosclerotic heart disease of native coronary artery without angina pectoris: Secondary | ICD-10-CM | POA: Diagnosis present

## 2021-03-20 DIAGNOSIS — Y95 Nosocomial condition: Secondary | ICD-10-CM | POA: Diagnosis present

## 2021-03-20 DIAGNOSIS — Z79899 Other long term (current) drug therapy: Secondary | ICD-10-CM

## 2021-03-20 DIAGNOSIS — I10 Essential (primary) hypertension: Secondary | ICD-10-CM | POA: Diagnosis present

## 2021-03-20 DIAGNOSIS — R410 Disorientation, unspecified: Secondary | ICD-10-CM

## 2021-03-20 DIAGNOSIS — Z87442 Personal history of urinary calculi: Secondary | ICD-10-CM

## 2021-03-20 DIAGNOSIS — Z888 Allergy status to other drugs, medicaments and biological substances status: Secondary | ICD-10-CM

## 2021-03-20 DIAGNOSIS — R69 Illness, unspecified: Secondary | ICD-10-CM

## 2021-03-20 DIAGNOSIS — Z7189 Other specified counseling: Secondary | ICD-10-CM

## 2021-03-20 DIAGNOSIS — I129 Hypertensive chronic kidney disease with stage 1 through stage 4 chronic kidney disease, or unspecified chronic kidney disease: Secondary | ICD-10-CM | POA: Diagnosis present

## 2021-03-20 DIAGNOSIS — M25552 Pain in left hip: Secondary | ICD-10-CM | POA: Diagnosis present

## 2021-03-20 DIAGNOSIS — J189 Pneumonia, unspecified organism: Secondary | ICD-10-CM | POA: Diagnosis present

## 2021-03-20 DIAGNOSIS — Z66 Do not resuscitate: Secondary | ICD-10-CM | POA: Diagnosis present

## 2021-03-20 DIAGNOSIS — F0390 Unspecified dementia without behavioral disturbance: Secondary | ICD-10-CM

## 2021-03-20 DIAGNOSIS — Z7902 Long term (current) use of antithrombotics/antiplatelets: Secondary | ICD-10-CM

## 2021-03-20 DIAGNOSIS — E039 Hypothyroidism, unspecified: Secondary | ICD-10-CM | POA: Diagnosis present

## 2021-03-20 DIAGNOSIS — F0280 Dementia in other diseases classified elsewhere without behavioral disturbance: Secondary | ICD-10-CM | POA: Diagnosis present

## 2021-03-20 DIAGNOSIS — Z86718 Personal history of other venous thrombosis and embolism: Secondary | ICD-10-CM

## 2021-03-20 DIAGNOSIS — Z7901 Long term (current) use of anticoagulants: Secondary | ICD-10-CM

## 2021-03-20 DIAGNOSIS — Z8546 Personal history of malignant neoplasm of prostate: Secondary | ICD-10-CM

## 2021-03-20 DIAGNOSIS — E785 Hyperlipidemia, unspecified: Secondary | ICD-10-CM | POA: Diagnosis present

## 2021-03-20 DIAGNOSIS — Z91041 Radiographic dye allergy status: Secondary | ICD-10-CM

## 2021-03-20 DIAGNOSIS — E1122 Type 2 diabetes mellitus with diabetic chronic kidney disease: Secondary | ICD-10-CM | POA: Diagnosis present

## 2021-03-20 DIAGNOSIS — H9190 Unspecified hearing loss, unspecified ear: Secondary | ICD-10-CM | POA: Diagnosis present

## 2021-03-20 DIAGNOSIS — G20 Parkinson's disease: Secondary | ICD-10-CM | POA: Diagnosis present

## 2021-03-20 DIAGNOSIS — R109 Unspecified abdominal pain: Secondary | ICD-10-CM

## 2021-03-20 DIAGNOSIS — N183 Chronic kidney disease, stage 3 unspecified: Secondary | ICD-10-CM | POA: Diagnosis present

## 2021-03-20 DIAGNOSIS — K219 Gastro-esophageal reflux disease without esophagitis: Secondary | ICD-10-CM | POA: Diagnosis present

## 2021-03-20 DIAGNOSIS — Z955 Presence of coronary angioplasty implant and graft: Secondary | ICD-10-CM

## 2021-03-20 DIAGNOSIS — Z8249 Family history of ischemic heart disease and other diseases of the circulatory system: Secondary | ICD-10-CM

## 2021-03-20 DIAGNOSIS — R270 Ataxia, unspecified: Secondary | ICD-10-CM | POA: Diagnosis present

## 2021-03-20 DIAGNOSIS — H353230 Exudative age-related macular degeneration, bilateral, stage unspecified: Secondary | ICD-10-CM | POA: Diagnosis present

## 2021-03-20 DIAGNOSIS — Z823 Family history of stroke: Secondary | ICD-10-CM

## 2021-03-20 DIAGNOSIS — I252 Old myocardial infarction: Secondary | ICD-10-CM

## 2021-03-20 DIAGNOSIS — J1289 Other viral pneumonia: Principal | ICD-10-CM | POA: Diagnosis present

## 2021-03-20 DIAGNOSIS — D631 Anemia in chronic kidney disease: Secondary | ICD-10-CM | POA: Diagnosis present

## 2021-03-20 DIAGNOSIS — N1832 Chronic kidney disease, stage 3b: Secondary | ICD-10-CM | POA: Diagnosis present

## 2021-03-20 DIAGNOSIS — B9730 Unspecified retrovirus as the cause of diseases classified elsewhere: Secondary | ICD-10-CM | POA: Diagnosis present

## 2021-03-20 DIAGNOSIS — Z96653 Presence of artificial knee joint, bilateral: Secondary | ICD-10-CM | POA: Diagnosis present

## 2021-03-20 DIAGNOSIS — I71012 Dissection of descending thoracic aorta: Secondary | ICD-10-CM | POA: Diagnosis present

## 2021-03-20 DIAGNOSIS — Z885 Allergy status to narcotic agent status: Secondary | ICD-10-CM

## 2021-03-20 DIAGNOSIS — E1129 Type 2 diabetes mellitus with other diabetic kidney complication: Secondary | ICD-10-CM | POA: Diagnosis present

## 2021-03-20 DIAGNOSIS — D638 Anemia in other chronic diseases classified elsewhere: Secondary | ICD-10-CM

## 2021-03-20 DIAGNOSIS — Z515 Encounter for palliative care: Secondary | ICD-10-CM

## 2021-03-20 DIAGNOSIS — Z79890 Hormone replacement therapy (postmenopausal): Secondary | ICD-10-CM

## 2021-03-20 DIAGNOSIS — Z20822 Contact with and (suspected) exposure to covid-19: Secondary | ICD-10-CM | POA: Diagnosis present

## 2021-03-20 DIAGNOSIS — I480 Paroxysmal atrial fibrillation: Secondary | ICD-10-CM | POA: Diagnosis present

## 2021-03-20 DIAGNOSIS — Z881 Allergy status to other antibiotic agents status: Secondary | ICD-10-CM

## 2021-03-20 DIAGNOSIS — Z8673 Personal history of transient ischemic attack (TIA), and cerebral infarction without residual deficits: Secondary | ICD-10-CM

## 2021-03-20 LAB — URINALYSIS, ROUTINE W REFLEX MICROSCOPIC
Bacteria, UA: NONE SEEN
Bilirubin Urine: NEGATIVE
Glucose, UA: NEGATIVE mg/dL
Hgb urine dipstick: NEGATIVE
Ketones, ur: NEGATIVE mg/dL
Nitrite: NEGATIVE
Protein, ur: NEGATIVE mg/dL
Specific Gravity, Urine: 1.011 (ref 1.005–1.030)
pH: 5 (ref 5.0–8.0)

## 2021-03-20 LAB — CBC WITH DIFFERENTIAL/PLATELET
Abs Immature Granulocytes: 0.06 10*3/uL (ref 0.00–0.07)
Basophils Absolute: 0 10*3/uL (ref 0.0–0.1)
Basophils Relative: 0 %
Eosinophils Absolute: 0.2 10*3/uL (ref 0.0–0.5)
Eosinophils Relative: 2 %
HCT: 32.4 % — ABNORMAL LOW (ref 39.0–52.0)
Hemoglobin: 10.2 g/dL — ABNORMAL LOW (ref 13.0–17.0)
Immature Granulocytes: 1 %
Lymphocytes Relative: 11 %
Lymphs Abs: 1.1 10*3/uL (ref 0.7–4.0)
MCH: 32.1 pg (ref 26.0–34.0)
MCHC: 31.5 g/dL (ref 30.0–36.0)
MCV: 101.9 fL — ABNORMAL HIGH (ref 80.0–100.0)
Monocytes Absolute: 1 10*3/uL (ref 0.1–1.0)
Monocytes Relative: 10 %
Neutro Abs: 7.7 10*3/uL (ref 1.7–7.7)
Neutrophils Relative %: 76 %
Platelets: 214 10*3/uL (ref 150–400)
RBC: 3.18 MIL/uL — ABNORMAL LOW (ref 4.22–5.81)
RDW: 12.5 % (ref 11.5–15.5)
WBC: 10.1 10*3/uL (ref 4.0–10.5)
nRBC: 0 % (ref 0.0–0.2)

## 2021-03-20 LAB — COMPREHENSIVE METABOLIC PANEL
ALT: <5 U/L (ref 0–44)
AST: 9 U/L — ABNORMAL LOW (ref 15–41)
Albumin: 3.4 g/dL — ABNORMAL LOW (ref 3.5–5.0)
Alkaline Phosphatase: 89 U/L (ref 38–126)
Anion gap: 8 (ref 5–15)
BUN: 39 mg/dL — ABNORMAL HIGH (ref 8–23)
CO2: 24 mmol/L (ref 22–32)
Calcium: 8.3 mg/dL — ABNORMAL LOW (ref 8.9–10.3)
Chloride: 108 mmol/L (ref 98–111)
Creatinine, Ser: 2.08 mg/dL — ABNORMAL HIGH (ref 0.61–1.24)
GFR, Estimated: 30 mL/min — ABNORMAL LOW (ref >60)
Glucose, Bld: 157 mg/dL — ABNORMAL HIGH (ref 70–99)
Potassium: 3.9 mmol/L (ref 3.5–5.1)
Sodium: 140 mmol/L (ref 135–145)
Total Bilirubin: 0.4 mg/dL (ref 0.3–1.2)
Total Protein: 6.4 g/dL — ABNORMAL LOW (ref 6.5–8.1)

## 2021-03-20 LAB — PROTIME-INR
INR: 1.2 (ref 0.8–1.2)
Prothrombin Time: 15.6 seconds — ABNORMAL HIGH (ref 11.4–15.2)

## 2021-03-20 LAB — TROPONIN I (HIGH SENSITIVITY)
Troponin I (High Sensitivity): 13 ng/L (ref <18)
Troponin I (High Sensitivity): 18 ng/L — ABNORMAL HIGH (ref <18)

## 2021-03-20 LAB — RESP PANEL BY RT-PCR (FLU A&B, COVID) ARPGX2
Influenza A by PCR: NEGATIVE
Influenza B by PCR: NEGATIVE
SARS Coronavirus 2 by RT PCR: NEGATIVE

## 2021-03-20 LAB — LACTIC ACID, PLASMA: Lactic Acid, Venous: 1.1 mmol/L (ref 0.5–1.9)

## 2021-03-20 LAB — BRAIN NATRIURETIC PEPTIDE: B Natriuretic Peptide: 287.6 pg/mL — ABNORMAL HIGH (ref 0.0–100.0)

## 2021-03-20 LAB — LIPASE, BLOOD: Lipase: 34 U/L (ref 11–51)

## 2021-03-20 LAB — D-DIMER, QUANTITATIVE: D-Dimer, Quant: 0.93 ug/mL-FEU — ABNORMAL HIGH (ref 0.00–0.50)

## 2021-03-20 MED ORDER — BRIMONIDINE TARTRATE-TIMOLOL 0.2-0.5 % OP SOLN: 1.0000 [drp] | Freq: Two times a day (BID) | OPHTHALMIC | Status: DC

## 2021-03-20 MED ORDER — TRAMADOL HCL 50 MG PO TABS
50.0000 mg | ORAL_TABLET | Freq: Once | ORAL | Status: AC
  Administered 2021-03-20: 50 mg via ORAL
  Filled 2021-03-20: qty 1

## 2021-03-20 MED ORDER — BRIMONIDINE TARTRATE 0.2 % OP SOLN
1.0000 [drp] | Freq: Two times a day (BID) | OPHTHALMIC | Status: DC
  Administered 2021-03-21 – 2021-03-24 (×8): 1 [drp] via OPHTHALMIC
  Filled 2021-03-20: qty 5

## 2021-03-20 MED ORDER — APIXABAN 2.5 MG PO TABS
2.5000 mg | ORAL_TABLET | Freq: Two times a day (BID) | ORAL | Status: DC
  Administered 2021-03-20 – 2021-03-24 (×8): 2.5 mg via ORAL
  Filled 2021-03-20 (×8): qty 1

## 2021-03-20 MED ORDER — BRINZOLAMIDE 1 % OP SUSP
1.0000 [drp] | Freq: Three times a day (TID) | OPHTHALMIC | Status: DC
  Administered 2021-03-21 – 2021-03-24 (×11): 1 [drp] via OPHTHALMIC
  Filled 2021-03-20: qty 10

## 2021-03-20 MED ORDER — ACETAMINOPHEN 650 MG RE SUPP: 650.0000 mg | Freq: Four times a day (QID) | RECTAL | Status: DC | PRN

## 2021-03-20 MED ORDER — SODIUM CHLORIDE 0.9% FLUSH
3.0000 mL | Freq: Two times a day (BID) | INTRAVENOUS | Status: DC
  Administered 2021-03-20 – 2021-03-24 (×7): 3 mL via INTRAVENOUS

## 2021-03-20 MED ORDER — MIRABEGRON ER 25 MG PO TB24
50.0000 mg | ORAL_TABLET | Freq: Every day | ORAL | Status: DC
  Administered 2021-03-21 – 2021-03-24 (×4): 50 mg via ORAL
  Filled 2021-03-20 (×4): qty 2

## 2021-03-20 MED ORDER — INSULIN ASPART 100 UNIT/ML IJ SOLN
0.0000 [IU] | Freq: Three times a day (TID) | INTRAMUSCULAR | Status: DC
  Administered 2021-03-21 (×2): 1 [IU] via SUBCUTANEOUS
  Administered 2021-03-22: 2 [IU] via SUBCUTANEOUS
  Administered 2021-03-22: 1 [IU] via SUBCUTANEOUS
  Administered 2021-03-23 (×2): 3 [IU] via SUBCUTANEOUS
  Administered 2021-03-24: 2 [IU] via SUBCUTANEOUS
  Filled 2021-03-20: qty 0.09

## 2021-03-20 MED ORDER — ACETAMINOPHEN 325 MG PO TABS: 650.0000 mg | ORAL_TABLET | Freq: Four times a day (QID) | ORAL | Status: DC | PRN

## 2021-03-20 MED ORDER — TIMOLOL MALEATE 0.5 % OP SOLN
1.0000 [drp] | Freq: Two times a day (BID) | OPHTHALMIC | Status: DC
  Administered 2021-03-21 – 2021-03-24 (×8): 1 [drp] via OPHTHALMIC
  Filled 2021-03-20: qty 5

## 2021-03-20 MED ORDER — LATANOPROST 0.005 % OP SOLN
1.0000 [drp] | Freq: Every day | OPHTHALMIC | Status: DC
  Administered 2021-03-21 – 2021-03-23 (×4): 1 [drp] via OPHTHALMIC
  Filled 2021-03-20: qty 2.5

## 2021-03-20 MED ORDER — TRAMADOL HCL 50 MG PO TABS
50.0000 mg | ORAL_TABLET | Freq: Once | ORAL | Status: AC
  Administered 2021-03-20: 21:00:00 50 mg via ORAL
  Filled 2021-03-20: qty 1

## 2021-03-20 MED ORDER — ALBUTEROL SULFATE (2.5 MG/3ML) 0.083% IN NEBU: 2.5000 mg | INHALATION_SOLUTION | Freq: Four times a day (QID) | RESPIRATORY_TRACT | Status: DC | PRN

## 2021-03-20 MED ORDER — MELATONIN 3 MG PO TABS
3.0000 mg | ORAL_TABLET | Freq: Every evening | ORAL | Status: DC | PRN
  Administered 2021-03-22 – 2021-03-23 (×2): 3 mg via ORAL
  Filled 2021-03-20 (×2): qty 1

## 2021-03-20 MED ORDER — TRAMADOL HCL 50 MG PO TABS
50.0000 mg | ORAL_TABLET | Freq: Four times a day (QID) | ORAL | Status: DC | PRN
  Administered 2021-03-21 – 2021-03-24 (×5): 50 mg via ORAL
  Filled 2021-03-20 (×5): qty 1

## 2021-03-20 MED ORDER — FUROSEMIDE 40 MG PO TABS
20.0000 mg | ORAL_TABLET | Freq: Every day | ORAL | Status: DC
  Administered 2021-03-21 – 2021-03-24 (×4): 20 mg via ORAL
  Filled 2021-03-20 (×4): qty 1

## 2021-03-20 MED ORDER — METOPROLOL SUCCINATE ER 50 MG PO TB24
25.0000 mg | ORAL_TABLET | Freq: Every day | ORAL | Status: DC
Start: 2021-03-21 — End: 2021-03-24
  Administered 2021-03-21 – 2021-03-24 (×4): 25 mg via ORAL
  Filled 2021-03-20 (×4): qty 1

## 2021-03-20 MED ORDER — LEVOTHYROXINE SODIUM 150 MCG PO TABS
150.0000 ug | ORAL_TABLET | Freq: Every day | ORAL | Status: DC
  Administered 2021-03-21 – 2021-03-24 (×4): 150 ug via ORAL
  Filled 2021-03-20 (×4): qty 1

## 2021-03-20 MED ORDER — DILTIAZEM HCL ER COATED BEADS 120 MG PO CP24
120.0000 mg | ORAL_CAPSULE | Freq: Every day | ORAL | Status: DC
  Administered 2021-03-21 – 2021-03-24 (×4): 120 mg via ORAL
  Filled 2021-03-20 (×4): qty 1

## 2021-03-20 MED ORDER — SODIUM CHLORIDE 0.9 % IV SOLN
2.0000 g | INTRAVENOUS | Status: DC
  Filled 2021-03-20: qty 20

## 2021-03-20 MED ORDER — AMIODARONE HCL 200 MG PO TABS
200.0000 mg | ORAL_TABLET | Freq: Every day | ORAL | Status: DC
  Administered 2021-03-21 – 2021-03-24 (×4): 200 mg via ORAL
  Filled 2021-03-20 (×4): qty 1

## 2021-03-20 MED ORDER — ACETAMINOPHEN 500 MG PO TABS
1000.0000 mg | ORAL_TABLET | Freq: Once | ORAL | Status: AC
  Administered 2021-03-20: 21:00:00 1000 mg via ORAL
  Filled 2021-03-20: qty 2

## 2021-03-20 MED ORDER — ALFUZOSIN HCL ER 10 MG PO TB24
10.0000 mg | ORAL_TABLET | Freq: Every day | ORAL | Status: DC
  Administered 2021-03-21 – 2021-03-23 (×3): 10 mg via ORAL
  Filled 2021-03-20 (×3): qty 1

## 2021-03-20 MED ORDER — POLYETHYLENE GLYCOL 3350 17 G PO PACK: 17.0000 g | PACK | Freq: Every day | ORAL | Status: DC | PRN

## 2021-03-20 MED ORDER — PANTOPRAZOLE SODIUM 40 MG PO TBEC
40.0000 mg | DELAYED_RELEASE_TABLET | Freq: Every day | ORAL | Status: DC
Start: 2021-03-21 — End: 2021-03-24
  Administered 2021-03-21 – 2021-03-24 (×4): 40 mg via ORAL
  Filled 2021-03-20 (×4): qty 1

## 2021-03-20 MED ORDER — SODIUM CHLORIDE 0.9 % IV SOLN
500.0000 mg | INTRAVENOUS | Status: DC
  Filled 2021-03-20: qty 5

## 2021-03-20 MED ORDER — SODIUM CHLORIDE 0.9 % IV SOLN
2.0000 g | Freq: Once | INTRAVENOUS | Status: AC
  Administered 2021-03-20: 21:00:00 2 g via INTRAVENOUS
  Filled 2021-03-20: qty 2

## 2021-03-20 MED ORDER — SERTRALINE HCL 50 MG PO TABS
25.0000 mg | ORAL_TABLET | Freq: Every day | ORAL | Status: DC
  Administered 2021-03-21 – 2021-03-24 (×4): 25 mg via ORAL
  Filled 2021-03-20 (×4): qty 1

## 2021-03-20 MED ORDER — VANCOMYCIN HCL 1750 MG/350ML IV SOLN
1750.0000 mg | Freq: Once | INTRAVENOUS | Status: AC
  Administered 2021-03-20: 21:00:00 1750 mg via INTRAVENOUS
  Filled 2021-03-20: qty 350

## 2021-03-20 MED ORDER — CARBIDOPA-LEVODOPA 25-100 MG PO TABS
1.0000 | ORAL_TABLET | Freq: Three times a day (TID) | ORAL | Status: DC
  Administered 2021-03-20 – 2021-03-24 (×11): 1 via ORAL
  Filled 2021-03-20 (×12): qty 1

## 2021-03-20 MED ORDER — CLOPIDOGREL BISULFATE 75 MG PO TABS
75.0000 mg | ORAL_TABLET | Freq: Every day | ORAL | Status: DC
  Administered 2021-03-21 – 2021-03-24 (×4): 75 mg via ORAL
  Filled 2021-03-20 (×4): qty 1

## 2021-03-20 NOTE — Progress Notes (Signed)
PHARMACY -  BRIEF ANTIBIOTIC NOTE  ? ?Pharmacy has received consult(s) for vancomycin from an ED provider.  The patient's profile has been reviewed for ht/wt/allergies/indication/available labs.   ? ?One time order(s) placed for vancomycin 1750 mg IV x 1 ? ?Further antibiotics/pharmacy consults should be ordered by admitting physician if indicated.       ?                ?Thank you, ? ?Tawnya Crook, PharmD, BCPS ?Clinical Pharmacist ?03/20/2021 8:23 PM ? ? ?

## 2021-03-20 NOTE — ED Notes (Signed)
ED TO INPATIENT HANDOFF REPORT  Name/Age/Gender [name] 88 y.o. male  Code Status    Code Status Orders  (From admission, onward)           Start     Ordered   03/20/21 2210  Do not attempt resuscitation (DNR)  Continuous       Question Answer Comment  In the event of cardiac or respiratory ARREST Do not call a code blue   In the event of cardiac or respiratory ARREST Do not perform Intubation, CPR, defibrillation or ACLS   In the event of cardiac or respiratory ARREST Use medication by any route, position, wound care, and other measures to relive pain and suffering. May use oxygen, suction and manual treatment of airway obstruction as needed for comfort.      03/20/21 2210           Code Status History     Date Active Date Inactive Code Status Order ID Comments User Context   08/03/2019 0951 08/05/2019 2019 DNR [id number]  Karmen Bongo, MD ED   07/12/2019 1215 07/14/2019 0442 DNR [id number]  Mckinley Jewel, MD ED   11/13/2018 1412 11/14/2018 2218 DNR [id number]  Norval Morton, MD ED   03/10/2018 1136 03/11/2018 1828 DNR [id number]  Karmen Bongo, MD Inpatient   08/13/2017 1740 08/17/2017 1907 DNR [id number]  Sid Falcon, MD ED   08/03/2017 2237 08/08/2017 1705 DNR [id number]  Ivor Costa, MD ED   04/22/2016 2202 04/23/2016 1551 Full Code [id number]  Franchot Gallo, MD Inpatient   01/25/2016 1725 01/27/2016 2031 Full Code [id number]  Cleon Gustin, MD ED   10/24/2015 0128 10/25/2015 1715 Full Code [id number]  Norval Morton, MD Inpatient   10/14/2014 1310 10/15/2014 2058 Full Code [id number]  Samella Parr, NP Inpatient       Home/SNF/Other Nursing Home  Chief Complaint Pneumonia [J18.9]  Level of Care/Admitting Diagnosis ED Disposition     ED Disposition  Admit   Condition  --   [facility]: [facility] [[id number]]  Level of Care: Telemetry [5]  Admit to tele based on following criteria: Other see comments  Comments:  Close monitoring, 88 yo with PNA  May place patient in observation at [facility] or [facility] if equivalent level of care is available:: Yes  Covid Evaluation: Confirmed COVID Negative  Diagnosis: Pneumonia [227785]  Admitting Physician: Marcelyn Bruins [[id number]]  Attending Physician: Marcelyn Bruins [phone]          Medical History Past Medical History:  Diagnosis Date   Allergic rhinitis    Arthritis    arthritis-kyphosis   Asthmatic bronchitis    CAD in native artery    a. STEMI 07/2017 s/p DES to LCx (25% mRCA and 70% oOM1 residual).   CKD (chronic kidney disease) stage 3, GFR 30-59 ml/min (HCC)    DM type 2 (diabetes mellitus, type 2) (HCC)    DVT, lower extremity, proximal, acute (HCC)    Dyslipidemia    GERD (gastroesophageal reflux disease)    Hearing impaired    bilateral hearing aids   History of kidney stones    History of skin cancer    Hypertension    Hypothyroidism    Macular degeneration    injections done bilaterally recent - 08-22-13   Normocytic anemia    PAF (paroxysmal atrial fibrillation) (HCC)    Parkinson's disease ([place])    Prostate cancer ([place])    Prostate cancer-radiation  only,skin cancer-basal cell and last squamous cell right eye   Statin intolerance    Stroke (HCC)    Urgency-frequency syndrome    Urticaria     Allergies Allergies  Allergen Reactions   Colchicine Anaphylaxis   Hibiclens [Chlorhexidine] Hives   Atorvastatin Other (See Comments)    Joint pain    Ceftin Diarrhea   Celebrex [Celecoxib] Swelling   Codeine Hives   Erythromycin Diarrhea   Ezetimibe Other (See Comments)    Joint pain    Iodinated Contrast Media Nausea And Vomiting and Rash   Oxycodone-Acetaminophen Hives   Rosuvastatin Other (See Comments)    Joint pain    Simvastatin Other (See Comments)    Joint pain    Cefuroxime    Celebrex [Celecoxib]    Codeine    Crestor [Rosuvastatin]    Erythromycin    Lipitor [Atorvastatin]    Nitroglycerin     Percocet [Oxycodone-Acetaminophen]    Protonix [Pantoprazole]    Tetanus Toxoids    Cefpodoxime Rash   Cefuroxime Axetil Rash   Nitroglycerin Other (See Comments)    Lowers patient's B/P   Vantin Other (See Comments)    Unknown/not noted on MAR??    IV Location/Drains/Wounds Patient Lines/Drains/Airways Status     Active Line/Drains/Airways     Name Placement date Placement time Site Days   Peripheral IV 03/20/21 20 G Anterior;Right Forearm 03/20/21  2045  Forearm  less than 1   Peripheral IV 03/20/21 Anterior;Left;Proximal Forearm 03/20/21  2000  Forearm  less than 1   Ureteral Drain/Stent Right ureter 6 Fr. 12/22/18  1307  Right ureter  819   Incision (Closed) 12/22/18 Penis Other (Comment) 12/22/18  1314  -- 819            Labs/Imaging Results for orders placed or performed during the hospital encounter of 03/20/21 (from the past 48 hour(s))  Urinalysis, Routine w reflex microscopic Urine, In & Out Cath     Status: Abnormal   Collection Time: 03/20/21  4:48 PM  Result Value Ref Range   Color, Urine BLUE (A) YELLOW    Comment: BIOCHEMICALS MAY BE AFFECTED BY COLOR   APPearance CLEAR CLEAR   Specific Gravity, Urine 1.011 1.005 - 1.030   pH 5.0 5.0 - 8.0   Glucose, UA NEGATIVE NEGATIVE mg/dL   Hgb urine dipstick NEGATIVE NEGATIVE   Bilirubin Urine NEGATIVE NEGATIVE   Ketones, ur NEGATIVE NEGATIVE mg/dL   Protein, ur NEGATIVE NEGATIVE mg/dL   Nitrite NEGATIVE NEGATIVE   Leukocytes,Ua TRACE (A) NEGATIVE   RBC / HPF 0-5 0 - 5 RBC/hpf   WBC, UA 6-10 0 - 5 WBC/hpf   Bacteria, UA NONE SEEN NONE SEEN   Squamous Epithelial / LPF 0-5 0 - 5   Mucus PRESENT    Hyaline Casts, UA PRESENT     Comment: Performed at [facility], [address] [place], [place] [address]  Comprehensive metabolic panel     Status: Abnormal   Collection Time: 03/20/21  5:16 PM  Result Value Ref Range   Sodium 140 135 - 145 mmol/L   Potassium 3.9 3.5 - 5.1 mmol/L    Chloride 108 98 - 111 mmol/L   CO2 24 22 - 32 mmol/L   Glucose, Bld 157 (H) 70 - 99 mg/dL    Comment: Glucose reference range applies only to samples taken after fasting for at least 8 hours.   BUN 39 (H) 8 - 23 mg/dL   Creatinine, Ser  2.08 (H) 0.61 - 1.24 mg/dL   Calcium 8.3 (L) 8.9 - 10.3 mg/dL   Total Protein 6.4 (L) 6.5 - 8.1 g/dL   Albumin 3.4 (L) 3.5 - 5.0 g/dL   AST 9 (L) 15 - 41 U/L   ALT <5 0 - 44 U/L   Alkaline Phosphatase 89 38 - 126 U/L   Total Bilirubin 0.4 0.3 - 1.2 mg/dL   GFR, Estimated 30 (L) >60 mL/min    Comment: (NOTE) Calculated using the CKD-EPI Creatinine Equation (2021)    Anion gap 8 5 - 15    Comment: Performed at [facility], [place] [address] [place], [place] [address]  Lactic acid, plasma     Status: None   Collection Time: 03/20/21  5:16 PM  Result Value Ref Range   Lactic Acid, Venous 1.1 0.5 - 1.9 mmol/L    Comment: Performed at [facility], [place] [address] [place], [place] [address]  Lipase, blood     Status: None   Collection Time: 03/20/21  5:16 PM  Result Value Ref Range   Lipase 34 11 - 51 U/L    Comment: Performed at [facility], [place] [address] [place], [place] [address]  Brain natriuretic peptide     Status: Abnormal   Collection Time: 03/20/21  5:16 PM  Result Value Ref Range   B Natriuretic Peptide 287.6 (H) 0.0 - 100.0 pg/mL    Comment: Performed at [facility], [place] [address], [place], [place] [address]  Troponin I (High Sensitivity)     Status: None   Collection Time: 03/20/21  5:16 PM  Result Value Ref Range   Troponin I (High Sensitivity) 13 <18 ng/L    Comment: (NOTE) Elevated high sensitivity troponin I (hsTnI) values and significant  changes across serial measurements may suggest ACS but many other  chronic and acute conditions are known to elevate hsTnI results.  Refer to the Links section for chest pain algorithms and additional   guidance. Performed at [facility], [place] [address], [place], [place] [address]   CBC with Differential     Status: Abnormal   Collection Time: 03/20/21  5:16 PM  Result Value Ref Range   WBC 10.1 4.0 - 10.5 K/uL   RBC 3.18 (L) 4.22 - 5.81 MIL/uL   Hemoglobin 10.2 (L) 13.0 - 17.0 g/dL   HCT 32.4 (L) 39.0 - 52.0 %   MCV 101.9 (H) 80.0 - 100.0 fL   MCH 32.1 26.0 - 34.0 pg   MCHC 31.5 30.0 - 36.0 g/dL   RDW 12.5 11.5 - 15.5 %   Platelets 214 150 - 400 K/uL   nRBC 0.0 0.0 - 0.2 %   Neutrophils Relative % 76 %   Neutro Abs 7.7 1.7 - 7.7 K/uL   Lymphocytes Relative 11 %   Lymphs Abs 1.1 0.7 - 4.0 K/uL   Monocytes Relative 10 %   Monocytes Absolute 1.0 0.1 - 1.0 K/uL   Eosinophils Relative 2 %   Eosinophils Absolute 0.2 0.0 - 0.5 K/uL   Basophils Relative 0 %   Basophils Absolute 0.0 0.0 - 0.1 K/uL   Immature Granulocytes 1 %   Abs Immature Granulocytes 0.06 0.00 - 0.07 K/uL    Comment: Performed at [facility], [place] [address], [place], [place] [address]  D-dimer, quantitative     Status: Abnormal   Collection Time: 03/20/21  5:16 PM  Result Value Ref Range  D-Dimer, Quant 0.93 (H) 0.00 - 0.50 ug/mL-FEU    Comment: (NOTE) At the manufacturer cut-off value of 0.5 g/mL FEU, this assay has a negative predictive value of 95-100%.This assay is intended for use in conjunction with a clinical pretest probability (PTP) assessment model to exclude pulmonary embolism (PE) and deep venous thrombosis (DVT) in outpatients suspected of PE or DVT. Results should be correlated with clinical presentation. Performed at [facility], [place] [address] [place], [place] [address]   Protime-INR     Status: Abnormal   Collection Time: 03/20/21  5:16 PM  Result Value Ref Range   Prothrombin Time 15.6 (H) 11.4 - 15.2 seconds   INR 1.2 0.8 - 1.2    Comment: (NOTE) INR goal varies based on device and disease states. Performed at [facility], [place] [address], [place], [place] [address]   Resp Panel by RT-PCR (Flu A&B, Covid) Nasopharyngeal Swab     Status: None   Collection Time: 03/20/21  8:06 PM   Specimen: Nasopharyngeal Swab; Nasopharyngeal(NP) swabs in vial transport medium  Result Value Ref Range   SARS Coronavirus 2 by RT PCR NEGATIVE NEGATIVE    Comment: (NOTE) SARS-CoV-2 target nucleic acids are NOT DETECTED.  The SARS-CoV-2 RNA is generally detectable in upper respiratory specimens during the acute phase of infection. The lowest concentration of SARS-CoV-2 viral copies this assay can detect is 138 copies/mL. A negative result does not preclude SARS-Cov-2 infection and should not be used as the sole basis for treatment or other patient management decisions. A negative result may occur with  improper specimen collection/handling, submission of specimen other than nasopharyngeal swab, presence of viral mutation(s) within the areas targeted by this assay, and inadequate number of viral copies(<138 copies/mL). A negative result must be combined with clinical observations, patient history, and epidemiological information. The expected result is Negative.  Fact Sheet for Patients:  EntrepreneurPulse.com.au  Fact Sheet for Healthcare Providers:  IncredibleEmployment.be  This test is no t yet approved or cleared by the [place] FDA and  has been authorized for detection and/or diagnosis of SARS-CoV-2 by FDA under an Emergency Use Authorization (EUA). This EUA will remain  in effect (meaning this test can be used) for the duration of the COVID-19 declaration under Section 564(b)(1) of the Act, 21 U.S.C.section 360bbb-3(b)(1), unless the authorization is terminated  or revoked sooner.       Influenza A by PCR NEGATIVE NEGATIVE   Influenza B by PCR NEGATIVE NEGATIVE    Comment: (NOTE) The Xpert Xpress SARS-CoV-2/FLU/RSV plus assay is intended as an  aid in the diagnosis of influenza from Nasopharyngeal swab specimens and should not be used as a sole basis for treatment. Nasal washings and aspirates are unacceptable for Xpert Xpress SARS-CoV-2/FLU/RSV testing.  Fact Sheet for Patients: EntrepreneurPulse.com.au  Fact Sheet for Healthcare Providers: IncredibleEmployment.be  This test is not yet approved or cleared by the [place] FDA and has been authorized for detection and/or diagnosis of SARS-CoV-2 by FDA under an Emergency Use Authorization (EUA). This EUA will remain in effect (meaning this test can be used) for the duration of the COVID-19 declaration under Section 564(b)(1) of the Act, 21 U.S.C. section 360bbb-3(b)(1), unless the authorization is terminated or revoked.  Performed at [facility], [place] [address] [place], [place] [address]    DG Chest 1 View  Result Date: 03/20/2021 CLINICAL DATA:  CT of flank and back pain. EXAM: CHEST  1 VIEW COMPARISON:  07/05/2020  FINDINGS: Upper normal heart size. Retrocardiac hiatal hernia. Airspace opacity at the right lung base. There also patchy opacities at the left lung base this is superimposed on chronic interstitial changes in the periphery of the left lung base. Limited assessment for pleural effusion. No pneumothorax. The bones are diffusely under mineralized. The left seventh posterior rib is poorly visualized which is nonspecific. IMPRESSION: 1. Bibasilar airspace opacities, right greater than left, suspicious for pneumonia, including aspiration. 2. Background left lung base scarring. 3. Retrocardiac hiatal hernia. 4. The left posterior seventh rib is poorly visualized, nonspecific. This is likely due to overlapping structures, although the possibility of rib destruction is difficult to exclude in the setting of left flank pain. Electronically Signed   By: Keith Rake M.D.   On: 03/20/2021 18:11   CT Chest Wo  Contrast  Result Date: 03/20/2021 CLINICAL DATA:  Left-sided chest pain cough EXAM: CT CHEST WITHOUT CONTRAST TECHNIQUE: Multidetector CT imaging of the chest was performed following the standard protocol without IV contrast. RADIATION DOSE REDUCTION: This exam was performed according to the departmental dose-optimization program which includes automated exposure control, adjustment of the mA and/or kV according to patient size and/or use of iterative reconstruction technique. COMPARISON:  Chest x-ray from earlier in the same day. FINDINGS: Cardiovascular: Somewhat limited due to lack of IV contrast. Calcifications of the thoracic aorta are noted. Findings of prior chronic dissection are seen the proximal descending thoracic aorta. No aneurysmal dilatation is noted. Coronary calcifications are seen. No cardiac enlargement is noted. Mediastinum/Nodes: Thoracic inlet is within normal limits. No sizable hilar or mediastinal adenopathy is seen. The esophagus as visualized is within normal limits with the exception of a moderate-sized hiatal hernia. Lungs/Pleura: Lungs are well aerated bilaterally. Patchy airspace opacities are identified in both lungs worst in the right lower lobe and right middle lobe consistent with multifocal pneumonia. No sizable effusion is seen. No sizable parenchymal nodules are noted. Upper Abdomen: Visualized upper abdomen shows evidence of cholelithiasis without complicating factors. Renal cystic change is seen. Musculoskeletal: Degenerative changes of the thoracic spine are noted. No rib abnormality is seen. IMPRESSION: Changes consistent with multifocal pneumonia worst in the right middle lobe and right lower lobe Cholelithiasis without complicating factors. Changes consistent with chronic dissection in the descending thoracic aorta. Aortic Atherosclerosis (ICD10-I70.0). Electronically Signed   By: Inez Catalina M.D.   On: 03/20/2021 20:25   CT RENAL STONE STUDY  Result Date:  03/20/2021 CLINICAL DATA:  Left-sided flank pain EXAM: CT ABDOMEN AND PELVIS WITHOUT CONTRAST TECHNIQUE: Multidetector CT imaging of the abdomen and pelvis was performed following the standard protocol without IV contrast. RADIATION DOSE REDUCTION: This exam was performed according to the departmental dose-optimization program which includes automated exposure control, adjustment of the mA and/or kV according to patient size and/or use of iterative reconstruction technique. COMPARISON:  12/01/2018 FINDINGS: Lower chest: Known multifocal pneumonia is again identified. Hepatobiliary: Cholelithiasis is seen. The liver is within normal limits. Pancreas: Cystic changes are again seen in the head of the pancreas stable from the prior exam. The largest of these measures 17 mm in greatest dimension. Spleen: Normal in size without focal abnormality. Adrenals/Urinary Tract: Adrenal glands are within normal limits. Multiple cysts are identified throughout both kidneys. The dominant cyst in the lower pole of the left kidney on the prior study has reduced in size. A dominant cyst on the right anteriorly measuring up to 8.9 cm is again noted and stable. Stable nonobstructing lower pole renal calculi are noted  on the left measuring up to 11 mm. No right renal calculi are seen. The ureters are within normal limits. Bladder is well distended. Stomach/Bowel: No obstructive or inflammatory changes of the colon are seen. The appendix is not well visualized although no inflammatory changes are seen. Large hiatal hernia is noted. The small bowel is unremarkable. Vascular/Lymphatic: Aortic atherosclerosis. No enlarged abdominal or pelvic lymph nodes. Reproductive: Prostate is unremarkable. Other: No abdominal wall hernia or abnormality. No abdominopelvic ascites. Musculoskeletal: Degenerative changes of lumbar spine are noted. Chronic L1 compression deformity is noted. IMPRESSION: Multifocal pneumonia similar to that seen on recent chest  CT. Cholelithiasis without complicating factors. Cystic changes in the kidneys and pancreas stable from the prior exam with the exception of reduction in size in a lower pole left renal cyst. Nonobstructing renal calculi in the lower pole of the left kidney measuring up to 11 mm. No obstructive changes are seen. Aortic Atherosclerosis (ICD10-I70.0). Electronically Signed   By: Inez Catalina M.D.   On: 03/20/2021 20:29   DG Hip Unilat With Pelvis 2-3 Views Left  Result Date: 03/20/2021 CLINICAL DATA:  Flank pain. EXAM: DG HIP (WITH OR WITHOUT PELVIS) 2-3V LEFT COMPARISON:  Left hip CT 08/13/2017 FINDINGS: The bones are under mineralized which limits assessment. No acute fracture. Mild left hip joint space narrowing and spurring. The pubic rami are grossly intact. No erosion, avascular necrosis, or bony destruction. Prior left inguinal hernia repair. IMPRESSION: Chronic osteoarthritis without acute osseous abnormality. Osteopenia/osteoporosis limits assessment. Electronically Signed   By: Keith Rake M.D.   On: 03/20/2021 18:09    Pending Labs Unresulted Labs (From admission, onward)     Start     Ordered   03/21/21 4235  Basic metabolic panel  Tomorrow morning,   R        03/20/21 2210   03/21/21 0500  CBC  Tomorrow morning,   R        03/20/21 2210   03/21/21 0500  Procalcitonin  Daily,   R      03/20/21 2211   03/20/21 2212  Procalcitonin - Baseline  ONCE - STAT,   STAT        03/20/21 2211   03/20/21 2211  Respiratory (~20 pathogens) panel by PCR  (Respiratory panel by PCR (~20 pathogens, ~24 hr TAT)  w precautions)  Once,   R        03/20/21 2210   03/20/21 2007  Culture, blood (routine x 2)  BLOOD CULTURE X 2,   STAT      03/20/21 2007            Vitals/Pain Today's Vitals   03/20/21 2016 03/20/21 2100 03/20/21 2200 03/20/21 2215  BP:  (!) 183/94 (!) 154/78   Pulse:  73 64   Resp:  (!) 25 (!) 21 (!) 23  Temp:      TempSrc:      SpO2:  92% 91%   Weight: 161 lb (73 kg)      Height: '5\' 10"'$  (1.778 m)       Isolation Precautions Droplet precaution  Medications Medications  vancomycin (VANCOREADY) IVPB 1750 mg/350 mL (1,750 mg Intravenous New Bag/Given 03/20/21 2045)  traMADol (ULTRAM) tablet 50 mg (has no administration in time range)  amiodarone (PACERONE) tablet 200 mg (has no administration in time range)  traMADol (ULTRAM) tablet 50 mg (has no administration in time range)  levothyroxine (SYNTHROID) tablet 150 mcg (has no administration in time range)  pantoprazole (PROTONIX) EC  tablet 40 mg (has no administration in time range)  apixaban (ELIQUIS) tablet 2.5 mg (has no administration in time range)  melatonin tablet 3 mg (has no administration in time range)  carbidopa-levodopa (SINEMET IR) 25-100 MG per tablet immediate release 1 tablet (has no administration in time range)  cefTRIAXone (ROCEPHIN) 2 g in sodium chloride 0.9 % 100 mL IVPB (has no administration in time range)  azithromycin (ZITHROMAX) 500 mg in sodium chloride 0.9 % 250 mL IVPB (has no administration in time range)  sodium chloride flush (NS) 0.9 % injection 3 mL (has no administration in time range)  acetaminophen (TYLENOL) tablet 650 mg (has no administration in time range)    Or  acetaminophen (TYLENOL) suppository 650 mg (has no administration in time range)  polyethylene glycol (MIRALAX / GLYCOLAX) packet 17 g (has no administration in time range)  albuterol (PROVENTIL) (2.5 MG/3ML) 0.083% nebulizer solution 2.5 mg (has no administration in time range)  alfuzosin (UROXATRAL) 24 hr tablet 10 mg (has no administration in time range)  brinzolamide (AZOPT) 1 % ophthalmic suspension 1 drop (has no administration in time range)  brimonidine-timolol (COMBIGAN) 0.2-0.5 % ophthalmic solution 1 drop (has no administration in time range)  clopidogrel (PLAVIX) tablet 75 mg (has no administration in time range)  diltiazem (CARDIZEM CD) 24 hr capsule 120 mg (has no administration in time range)   furosemide (LASIX) tablet 20 mg (has no administration in time range)  latanoprost (XALATAN) 0.005 % ophthalmic solution 1 drop (has no administration in time range)  metoprolol succinate (TOPROL-XL) 24 hr tablet 25 mg (has no administration in time range)  mirabegron ER (MYRBETRIQ) tablet 50 mg (has no administration in time range)  sertraline (ZOLOFT) tablet 25 mg (has no administration in time range)  traMADol (ULTRAM) tablet 50 mg (50 mg Oral Given 03/20/21 2039)  acetaminophen (TYLENOL) tablet 1,000 mg (1,000 mg Oral Given 03/20/21 2038)  aztreonam (AZACTAM) 2 g in sodium chloride 0.9 % 100 mL IVPB (0 g Intravenous Stopped 03/20/21 2124)    Mobility walks with person assist

## 2021-03-20 NOTE — H&P (Signed)
History and Physical   [name] VEH:[id number] DOB: [birth year] DOA: 03/20/2021  PCP: Patient, No Pcp Per (Inactive)   Patient coming from: [facility]  Chief Complaint: Flank pain  HPI: [name] is a 88 y.o. male with medical history significant of diabetes, hypothyroidism, anemia, atrial fibrillation, CAD, hyperlipidemia, GERD, CVA, CKD 3, hypertension, Parkinson disease, ataxia presenting with flank pain.  History obtained with assistance of chart review due to patient's difficulty with being hard of hearing possible memory deficits in the setting of Parkinson's disease in this 88 year old gentleman.  Patient has had some ongoing left flank/hip pain and shoulder pain for the past couple of days.  ED provider spoke with facility and patient was at his baseline mobility today however did have decreased mobility yesterday apparently due to pain.  Also has had a wet cough for the past couple days and recently started Robitussin for this.  Patient denies fevers, chills, chest pain, shortness of breath, constipation, diarrhea, nausea, vomiting, abdominal pain.  ED Course: Vital signs in the ED significant for blood pressure in the 836O to 294 systolic, respiratory rate in the teens to 20s.  Lab work-up included CMP which showed creatinine stable at 2.08, glucose 157, calcium 8.3, protein 6.4, albumin 3.4.  CBC with hemoglobin 10.2 down from 12-year ago.  Troponin negative with repeat pending.  D-dimer age-adjusted normal at 0.93.  Lactic acid normal with repeat pending.  Lipase normal.  BNP borderline at 287.  Respiratory panel for flu and COVID-negative.  PT and INR with mildly elevated PT at 15.6 and normal INR.  Urinalysis with blue urine and trace leukocytes only.  Blood cultures pending.  Imaging studies included chest x-ray which showed bibasilar opacities right greater than left suspicious for pneumonia, scarring, hiatal hernia, poorly visualized left seventh rib.  Hip  x-ray on the left with no acute abnormality.  CT of the chest showing multifocal pneumonia and changes consistent with chronic descending aortic dissection.  CT renal stone study with above pneumonia in the lower lobes and evidence of cystic kidney disease and nonobstructive renal stones.  Patient received dose of vancomycin and aztreonam in the ED.  Also Tylenol and tramadol.  Review of Systems: As per HPI otherwise all other systems reviewed and are negative.  Past Medical History:  Diagnosis Date   Allergic rhinitis    Arthritis    arthritis-kyphosis   Asthmatic bronchitis    CAD in native artery    a. STEMI 07/2017 s/p DES to LCx (25% mRCA and 70% oOM1 residual).   CKD (chronic kidney disease) stage 3, GFR 30-59 ml/min (HCC)    DM type 2 (diabetes mellitus, type 2) (HCC)    DVT, lower extremity, proximal, acute (HCC)    Dyslipidemia    GERD (gastroesophageal reflux disease)    Hearing impaired    bilateral hearing aids   History of kidney stones    History of skin cancer    Hypertension    Hypothyroidism    Macular degeneration    injections done bilaterally recent - 08-22-13   Normocytic anemia    PAF (paroxysmal atrial fibrillation) (HCC)    Parkinson's disease ([place])    Prostate cancer ([place])    Prostate cancer-radiation only,skin cancer-basal cell and last squamous cell right eye   Statin intolerance    Stroke [facility])    Urgency-frequency syndrome    Urticaria     Past Surgical History:  Procedure Laterality Date   BACK SURGERY  lumbar fracture   cataract surgery Bilateral    COLONOSCOPY W/ POLYPECTOMY     x2    CORONARY STENT INTERVENTION N/A 08/06/2017   Procedure: CORONARY STENT INTERVENTION;  Surgeon: Jettie Booze, MD;  Location: [facility];  Service: Cardiovascular;  Laterality: N/A;   CYSTOSCOPY W/ URETERAL STENT PLACEMENT Right 08/13/2017   Procedure: CYSTOSCOPY WITH RIGHT  RETROGRADE PYELOGRAM/URETERAL STENT PLACEMENT;  Surgeon: Raynelle Bring,  MD;  Location: [facility];  Service: Urology;  Laterality: Right;   CYSTOSCOPY WITH INJECTION N/A 09/11/2013   Procedure: CYSTOSCOPY WITH BOTOX INJECTION INTO THE BLADDER;  Surgeon: Ailene Rud, MD;  Location: [facility];  Service: Urology;  Laterality: N/A;   CYSTOSCOPY WITH RETROGRADE PYELOGRAM, URETEROSCOPY AND STENT PLACEMENT     CYSTOSCOPY WITH RETROGRADE PYELOGRAM, URETEROSCOPY AND STENT PLACEMENT Right 12/22/2018   Procedure: CYSTOSCOPY WITH RETROGRADE PYELOGRAM, URETEROSCOPY AND STENT PLACEMENT;  Surgeon: Cleon Gustin, MD;  Location: [facility];  Service: Urology;  Laterality: Right;  90 MINS   CYSTOSCOPY WITH STENT PLACEMENT Right 01/26/2016   Procedure: CYSTOSCOPY, RIGHT RETROGRADE WITH RIGHT URETERAL STENT PLACEMENT;  Surgeon: Cleon Gustin, MD;  [facility]: [facility];  Service: Urology;  Laterality: Right;   CYSTOSCOPY/RETROGRADE/URETEROSCOPY Right 04/22/2016   Procedure: CYSTOSCOPY/RETROGRADE/ REMOVAL JJ STENT right insertion stent right insertion of foley;  Surgeon: Franchot Gallo, MD;  Location: [facility];  Service: Urology;  Laterality: Right;   EXTRACORPOREAL SHOCK WAVE LITHOTRIPSY Right 02/27/2016   Procedure: RIGHT EXTRACORPOREAL SHOCK WAVE LITHOTRIPSY (ESWL) GAITED;  Surgeon: Cleon Gustin, MD;  [facility];  Service: Urology;  Laterality: Right;   EXTRACORPOREAL SHOCK WAVE LITHOTRIPSY Right 02/10/2016   Procedure: EXTRACORPOREAL SHOCK WAVE LITHOTRIPSY (ESWL);  Surgeon: Kathie Rhodes, MD;  [facility];  Service: Urology;  Laterality: Right;  [facility] [employer] [phone] [place] [employer]- [id number]    EYE SURGERY     macular degeneration   GALLBLADDER SURGERY  2006   no cholecystectomy   HERNIA REPAIR     HOLMIUM LASER APPLICATION Right 20/94/7096   Procedure: HOLMIUM LASER APPLICATION;  Surgeon: Cleon Gustin, MD;  [facility];  Service: Urology;  Laterality: Right;   LEFT HEART CATH AND CORONARY ANGIOGRAPHY N/A 08/06/2017    Procedure: LEFT HEART CATH AND CORONARY ANGIOGRAPHY;  Surgeon: Jettie Booze, MD;  Location: [facility];  Service: Cardiovascular;  Laterality: N/A;   LUNG REMOVAL, PARTIAL  age 8   for bronchiectasis   PARTIAL THYMECTOMY  1985   for nodules   POPLITEAL SYNOVIAL CYST EXCISION  2005   TOTAL KNEE ARTHROPLASTY  2008   right   TOTAL KNEE ARTHROPLASTY  2010   left    Social History  reports that he has never smoked. He has never used smokeless tobacco. He reports that he does not drink alcohol and does not use drugs.  Allergies  Allergen Reactions   Colchicine Anaphylaxis   Hibiclens [Chlorhexidine] Hives   Atorvastatin Other (See Comments)    Joint pain    Ceftin Diarrhea   Celebrex [Celecoxib] Swelling   Codeine Hives   Erythromycin Diarrhea   Ezetimibe Other (See Comments)    Joint pain    Iodinated Contrast Media Nausea And Vomiting and Rash   Oxycodone-Acetaminophen Hives   Rosuvastatin Other (See Comments)    Joint pain    Simvastatin Other (See Comments)    Joint pain    Cefuroxime    Celebrex [Celecoxib]    Codeine    Crestor [Rosuvastatin]  Erythromycin    Lipitor [Atorvastatin]    Nitroglycerin    Percocet [Oxycodone-Acetaminophen]    Protonix [Pantoprazole]    Tetanus Toxoids    Cefpodoxime Rash   Cefuroxime Axetil Rash   Nitroglycerin Other (See Comments)    Lowers patient's B/P   Vantin Other (See Comments)    Unknown/not noted on MAR??    Family History  Problem Relation Age of Onset   Other Sister        ophth disease   Heart attack Father        x7   Heart disease Father        MI x7   Hypertension Mother    Stroke Mother 92   Amblyopia Neg Hx    Blindness Neg Hx    Glaucoma Neg Hx    Macular degeneration Neg Hx    Retinal detachment Neg Hx    Strabismus Neg Hx    Retinitis pigmentosa Neg Hx    Cataracts Neg Hx   Reviewed on admission  Prior to Admission medications   Medication Sig Start Date End Date Taking?  Authorizing Provider  ACCU-CHEK GUIDE test strip  08/25/19   [provider]  alfuzosin (UROXATRAL) 10 MG 24 hr tablet Take 1 tablet (10 mg total) by mouth at bedtime. 02/10/16   Kathie Rhodes, MD  Alogliptin Benzoate 12.5 MG TABS Take 12.5 mg by mouth daily.     [provider]  amiodarone (PACERONE) 200 MG tablet Take 1 tablet (200 mg total) by mouth daily. 08/17/19   Dwyane Dee, MD  apixaban (ELIQUIS) 2.5 MG TABS tablet Take 2.5 mg by mouth 2 (two) times daily.    [provider]  brimonidine-timolol (COMBIGAN) 0.2-0.5 % ophthalmic solution Place 1 drop into both eyes 2 (two) times daily.    [provider]  brinzolamide (AZOPT) 1 % ophthalmic suspension Place 1 drop into the right eye 3 (three) times daily.  09/03/16   [provider]  carbidopa-levodopa (SINEMET IR) 25-100 MG tablet Take 1 tablet by mouth 3 (three) times daily.     [provider]  Carboxymethylcellulose Sodium (ARTIFICIAL TEARS OP) Place 1 drop into both eyes 4 (four) times daily.    [provider]  Cholecalciferol (VITAMIN D-3) 5000 units TABS Take 5,000 Units by mouth daily.    [provider]  clopidogrel (PLAVIX) 75 MG tablet Take 75 mg by mouth daily.     [provider]  colesevelam (WELCHOL) 625 MG tablet Take 1,875 mg by mouth every evening.     [provider]  Cyanocobalamin (VITAMIN B-12) 2500 MCG SUBL Place 1 tablet under the tongue daily.    [provider]  diltiazem (CARDIZEM CD) 120 MG 24 hr capsule Take 1 capsule (120 mg total) by mouth daily for 30 days. 03/12/18   Elodia Florence., MD  docusate sodium (COLACE) 100 MG capsule Take 100 mg by mouth 2 (two) times daily.     [provider]  ferrous sulfate 325 (65 FE) MG EC tablet Take 1 tablet (325 mg total) by mouth 2 (two) times daily with a meal. 11/14/18 11/14/19  Danford, Suann Larry, MD  fluticasone (FLONASE) 50 MCG/ACT nasal spray Place 2  sprays into both nostrils daily.    [provider]  furosemide (LASIX) 20 MG tablet Take by mouth. 03/05/20   [provider]  latanoprost (XALATAN) 0.005 % ophthalmic solution Place 1 drop into the right eye at bedtime.  07/28/16  [provider]  levocetirizine (XYZAL) 5 MG tablet Take 2.5 mg by mouth at bedtime.  04/15/18   [provider]  levothyroxine (SYNTHROID) 150 MCG tablet Take 150 mcg by mouth daily.     [provider]  Melatonin 3 MG SUBL Place 3 mg under the tongue at bedtime. 11/11/18   [provider]  Meth-Hyo-M Bl-Na Phos-Ph Sal (URO-MP) 118 MG CAPS Take 1 capsule by mouth 3 (three) times daily.    [provider]  metoprolol succinate (TOPROL-XL) 25 MG 24 hr tablet Take 1 tablet (25 mg total) by mouth daily. 08/06/19   Dwyane Dee, MD  mirabegron ER (MYRBETRIQ) 50 MG TB24 tablet Take 1 tablet (50 mg total) by mouth daily. 01/28/16   McKenzie, Candee Furbish, MD  Multiple Vitamins-Minerals (PRESERVISION/LUTEIN) CAPS Take 1 capsule by mouth 2 (two) times daily.    [provider]  pantoprazole (PROTONIX) 40 MG tablet Take 40 mg by mouth daily. 06/29/19   [provider]  polyethylene glycol (MIRALAX / GLYCOLAX) packet Take 17 g by mouth daily. [place]    [provider]  Potassium Chloride ER 20 MEQ TBCR Take 1 tablet by mouth daily. 11/20/19   [provider]  sertraline (ZOLOFT) 25 MG tablet Take 25 mg by mouth daily.    [provider]  traMADol (ULTRAM) 50 MG tablet Take 50 mg by mouth every 6 (six) hours as needed. 05/07/20   [provider]    Physical Exam: Vitals:   03/20/21 1900 03/20/21 2000 03/20/21 2016 03/20/21 2100  BP: (!) 178/92 (!) 180/93  (!) 183/94  Pulse: 73 74  73  Resp: 15 (!) 23  (!) 25  Temp:      TempSrc:      SpO2: 92% 93%  92%  Weight:   73 kg   Height:   '5\' 10"'$  (1.778 m)     Physical Exam Constitutional:      General: He is not  in acute distress.    Appearance: Normal appearance.  HENT:     Head: Normocephalic and atraumatic.     Mouth/Throat:     Mouth: Mucous membranes are moist.     Pharynx: Oropharynx is clear.  Eyes:     Extraocular Movements: Extraocular movements intact.     Pupils: Pupils are equal, round, and reactive to light.  Cardiovascular:     Rate and Rhythm: Normal rate and regular rhythm.     Pulses: Normal pulses.     Heart sounds: Normal heart sounds.  Pulmonary:     Effort: Pulmonary effort is normal. No respiratory distress.     Breath sounds: Normal breath sounds.  Abdominal:     General: Bowel sounds are normal. There is no distension.     Palpations: Abdomen is soft.     Tenderness: There is no abdominal tenderness.  Musculoskeletal:        General: No swelling or deformity.     Right lower leg: Edema (Trace pedal edema) present.  Skin:    General: Skin is warm and dry.  Neurological:     General: No focal deficit present.     Mental Status: Mental status is at baseline.     Comments: Very hard of hearing but answers questions appropriately.   Labs on Admission: I have personally reviewed following labs and imaging studies  CBC: Recent Labs  Lab 03/20/21 1716  WBC 10.1  NEUTROABS 7.7  HGB 10.2*  HCT 32.4*  MCV 101.9*  PLT 413    Basic Metabolic Panel: Recent Labs  Lab 03/20/21 1716  NA 140  K 3.9  CL 108  CO2 24  GLUCOSE 157*  BUN 39*  CREATININE 2.08*  CALCIUM 8.3*    GFR: Estimated Creatinine Clearance: 25.8 mL/min (A) (by C-G formula based on SCr of 2.08 mg/dL (H)).  Liver Function Tests: Recent Labs  Lab 03/20/21 1716  AST 9*  ALT <5  ALKPHOS 89  BILITOT 0.4  PROT 6.4*  ALBUMIN 3.4*    Urine analysis:    Component Value Date/Time   COLORURINE BLUE (A) 03/20/2021 1648   APPEARANCEUR CLEAR 03/20/2021 1648   LABSPEC 1.011 03/20/2021 1648   PHURINE 5.0 03/20/2021 1648   GLUCOSEU NEGATIVE 03/20/2021 1648   HGBUR NEGATIVE 03/20/2021 1648    BILIRUBINUR NEGATIVE 03/20/2021 1648   KETONESUR NEGATIVE 03/20/2021 1648   PROTEINUR NEGATIVE 03/20/2021 1648   UROBILINOGEN 0.2 05/26/2014 2040   NITRITE NEGATIVE 03/20/2021 1648   LEUKOCYTESUR TRACE (A) 03/20/2021 1648    Radiological Exams on Admission: DG Chest 1 View  Result Date: 03/20/2021 CLINICAL DATA:  CT of flank and back pain. EXAM: CHEST  1 VIEW COMPARISON:  07/05/2020 FINDINGS: Upper normal heart size. Retrocardiac hiatal hernia. Airspace opacity at the right lung base. There also patchy opacities at the left lung base this is superimposed on chronic interstitial changes in the periphery of the left lung base. Limited assessment for pleural effusion. No pneumothorax. The bones are diffusely under mineralized. The left seventh posterior rib is poorly visualized which is nonspecific. IMPRESSION: 1. Bibasilar airspace opacities, right greater than left, suspicious for pneumonia, including aspiration. 2. Background left lung base scarring. 3. Retrocardiac hiatal hernia. 4. The left posterior seventh rib is poorly visualized, nonspecific. This is likely due to overlapping structures, although the possibility of rib destruction is difficult to exclude in the setting of left flank pain. Electronically Signed   By: Keith Rake M.D.   On: 03/20/2021 18:11   CT Chest Wo Contrast  Result Date: 03/20/2021 CLINICAL DATA:  Left-sided chest pain cough EXAM: CT CHEST WITHOUT CONTRAST TECHNIQUE: Multidetector CT imaging of the chest was performed following the standard protocol without IV contrast. RADIATION DOSE REDUCTION: This exam was performed according to the departmental dose-optimization program which includes automated exposure control, adjustment of the mA and/or kV according to patient size and/or use of iterative reconstruction technique. COMPARISON:  Chest x-ray from earlier in the same day. FINDINGS: Cardiovascular: Somewhat limited due to lack of IV contrast. Calcifications of the  thoracic aorta are noted. Findings of prior chronic dissection are seen the proximal descending thoracic aorta. No aneurysmal dilatation is noted. Coronary calcifications are seen. No cardiac enlargement is noted. Mediastinum/Nodes: Thoracic inlet is within normal limits. No sizable hilar or mediastinal adenopathy is seen. The esophagus as visualized is within normal limits with the exception of a moderate-sized hiatal hernia. Lungs/Pleura: Lungs are well aerated bilaterally. Patchy airspace opacities are identified in both lungs worst in the right lower lobe and right middle lobe consistent with multifocal pneumonia. No sizable effusion is seen. No sizable parenchymal nodules are noted. Upper Abdomen: Visualized upper abdomen shows evidence of cholelithiasis without complicating factors. Renal cystic change is seen. Musculoskeletal: Degenerative changes of the thoracic spine are noted. No rib abnormality is seen. IMPRESSION: Changes consistent with multifocal pneumonia worst in the right middle lobe and right lower lobe Cholelithiasis without complicating factors. Changes consistent with chronic dissection in the descending thoracic aorta. Aortic  Atherosclerosis (ICD10-I70.0). Electronically Signed   By: Inez Catalina M.D.   On: 03/20/2021 20:25   CT RENAL STONE STUDY  Result Date: 03/20/2021 CLINICAL DATA:  Left-sided flank pain EXAM: CT ABDOMEN AND PELVIS WITHOUT CONTRAST TECHNIQUE: Multidetector CT imaging of the abdomen and pelvis was performed following the standard protocol without IV contrast. RADIATION DOSE REDUCTION: This exam was performed according to the departmental dose-optimization program which includes automated exposure control, adjustment of the mA and/or kV according to patient size and/or use of iterative reconstruction technique. COMPARISON:  12/01/2018 FINDINGS: Lower chest: Known multifocal pneumonia is again identified. Hepatobiliary: Cholelithiasis is seen. The liver is within normal  limits. Pancreas: Cystic changes are again seen in the head of the pancreas stable from the prior exam. The largest of these measures 17 mm in greatest dimension. Spleen: Normal in size without focal abnormality. Adrenals/Urinary Tract: Adrenal glands are within normal limits. Multiple cysts are identified throughout both kidneys. The dominant cyst in the lower pole of the left kidney on the prior study has reduced in size. A dominant cyst on the right anteriorly measuring up to 8.9 cm is again noted and stable. Stable nonobstructing lower pole renal calculi are noted on the left measuring up to 11 mm. No right renal calculi are seen. The ureters are within normal limits. Bladder is well distended. Stomach/Bowel: No obstructive or inflammatory changes of the colon are seen. The appendix is not well visualized although no inflammatory changes are seen. Large hiatal hernia is noted. The small bowel is unremarkable. Vascular/Lymphatic: Aortic atherosclerosis. No enlarged abdominal or pelvic lymph nodes. Reproductive: Prostate is unremarkable. Other: No abdominal wall hernia or abnormality. No abdominopelvic ascites. Musculoskeletal: Degenerative changes of lumbar spine are noted. Chronic L1 compression deformity is noted. IMPRESSION: Multifocal pneumonia similar to that seen on recent chest CT. Cholelithiasis without complicating factors. Cystic changes in the kidneys and pancreas stable from the prior exam with the exception of reduction in size in a lower pole left renal cyst. Nonobstructing renal calculi in the lower pole of the left kidney measuring up to 11 mm. No obstructive changes are seen. Aortic Atherosclerosis (ICD10-I70.0). Electronically Signed   By: Inez Catalina M.D.   On: 03/20/2021 20:29   DG Hip Unilat With Pelvis 2-3 Views Left  Result Date: 03/20/2021 CLINICAL DATA:  Flank pain. EXAM: DG HIP (WITH OR WITHOUT PELVIS) 2-3V LEFT COMPARISON:  Left hip CT 08/13/2017 FINDINGS: The bones are under  mineralized which limits assessment. No acute fracture. Mild left hip joint space narrowing and spurring. The pubic rami are grossly intact. No erosion, avascular necrosis, or bony destruction. Prior left inguinal hernia repair. IMPRESSION: Chronic osteoarthritis without acute osseous abnormality. Osteopenia/osteoporosis limits assessment. Electronically Signed   By: Keith Rake M.D.   On: 03/20/2021 18:09    EKG: Not performed in the ED  Assessment/Plan Principal Problem:   Pneumonia Active Problems:   Hypothyroidism (post surgical)   Type 2 diabetes mellitus with chronic kidney disease, without long-term current use of insulin (HCC)   History of CVA (cerebrovascular accident)   Benign essential HTN   CKD (chronic kidney disease), stage III (HCC)   Type II diabetes mellitus with renal manifestations (HCC)   GERD (gastroesophageal reflux disease)   Coronary artery disease involving native coronary artery of native heart without angina pectoris   Hyperlipidemia LDL goal <70   Paroxysmal atrial fibrillation (HCC)   Parkinson's disease ([place])   Pneumonia > Patient initially presented with flank pain as below work-up  negative. > Also noted to have wet cough recently. > Work-up in the ED showed chest x-ray with bilateral opacities right greater than left and CT scan with multifocal pneumonia changes as well. > Normal lactic acid, no leukocytosis, age-adjusted D-dimer normal. > No hypoxia, no fevers documented. > Due to him residing in a nursing home patient received a dose of vancomycin and aztreonam in the ED. > Flu and COVID screening is negative. > Unclear etiology for this pneumonia possibly viral given lack of leukocytosis, however may mount less of a response given he is a 88 years old.  We will check full respiratory viral panel to rule out viral etiology. - Monitor on telemetry - Switch antibiotics to ceftriaxone and azithromycin (has had intolerances.  Cephalosporins in the past  however did not tolerate ceftriaxone in 30.  Per urology note) - Check respiratory viral panel - Procalcitonin - Trend fever curve and white count - Follow-up blood cultures  Left flank pain  > Presenting with left flank pain unclear etiology.  Ongoing for the past couple days. > Imaging work-up in the ED without explanation for flank pain.  Given it is in his hip/flank unclear if there could be a bursitis involved. - Continue to monitor - Continue home as needed tramadol  Diabetes - SSI   Hypothyroidism - Continue home Synthroid  Anemia > Hemoglobin 10.2 down from 12-year ago - Trend CBC  Atrial fibrillation - Continue home amiodarone and Eliquis - Continue diltiazem and metoprolol  CAD Hyperlipidemia - Continue home plavix, metoprolol  History of CVA - Continue home Plavix  CKD 3 > Creatinine stable at 2.08 in the ED. - Continue to monitor  Hypertension - Continue home diltiazem and metoprolol  Parkinson's disease - Continue home Sinemet  Eye problems including Macular degeneration - Continue home eyedrops  Urinary issues, no diagnosis in chart - Continue home alfuzosin, mirabegron (confirmed with paperwork from facility that he is taking this)  DVT prophylaxis: Eliquis Code Status:   DNR Family Communication:  Daughter, Amy, updated by phone.  She would like to be updated with any further significant updates or results as she lives out of state in [place]. Disposition Plan:   Patient is from:  [facility]  Anticipated DC to:  Same as above  Anticipated DC date:  1 to 2 days  Anticipated DC barriers: None  Consults called:  None Admission status:  Observation, telemetry  Severity of Illness: The appropriate patient status for this patient is OBSERVATION. Observation status is judged to be reasonable and necessary in order to provide the required intensity of service to ensure the patient's safety. The patient's presenting symptoms, physical  exam findings, and initial radiographic and laboratory data in the context of their medical condition is felt to place them at decreased risk for further clinical deterioration. Furthermore, it is anticipated that the patient will be medically stable for discharge from the hospital within 2 midnights of admission.    Marcelyn Bruins MD [facility]  How to contact the [facility] Attending or Consulting provider [place] or covering provider during after hours [place], for this patient?   Check the care team in [facility] and look for a) attending/consulting [facility] provider listed and b) the [facility] team listed Log into [email or web address] and use [facility] universal password to access. If you do not have the password, please contact the hospital operator. Locate the [facility] provider you are looking for under [facility] and page to a  number that you can be directly reached. If you still have difficulty reaching the provider, please page the [facility] (Director on Call) for the Hospitalists listed on amion for assistance.  03/20/2021, 10:11 PM

## 2021-03-20 NOTE — ED Provider Notes (Signed)
[place] DEPT Provider Note   CSN: [id number] Arrival date & time: 03/20/21  1620     History  Chief Complaint  Patient presents with   Flank Pain    [name] is a 88 y.o. male.  HPI Patient is a difficult historian.  He appears to be hard of hearing and may have some dementia.  The triage note indicates patient had complaint of significant left-sided flank back and shoulder pain.  Reportedly patient was hypotensive upon arrival by EMS and was given fluids and fentanyl for pain.   Obtained history both from the [facility] caregiver and the patient.  Per the patient his pain started within the past few days.  He does not identify any fall or injury.  He indicates his pain is in his hip and a little bit up on the flank.  He reports he wondered if he might have a kidney stone.  Precious at [facility] advises that the patient seemed well this morning he went to breakfast and lunch and was at baseline mobility using both a rolling walker and a rolling wheelchair.  She reports there was apparently some pain in his hip and back yesterday.  She reports she thinks it did diminish his overall activity yesterday.  She reports that the patient seemed to express a lot of pain after he has some visitors later in the afternoon.  He also noticed that he has a fairly wet sounding cough.  The caregiver reports he was started on some Robitussin for cough fairly recently.  No antibiotics.    Home Medications Prior to Admission medications   Medication Sig Start Date End Date Taking? Authorizing Provider  ACCU-CHEK GUIDE test strip  08/25/19   [provider]  alfuzosin (UROXATRAL) 10 MG 24 hr tablet Take 1 tablet (10 mg total) by mouth at bedtime. 02/10/16   Kathie Rhodes, MD  Alogliptin Benzoate 12.5 MG TABS Take 12.5 mg by mouth daily.     [provider]  amiodarone (PACERONE) 200 MG tablet Take 1 tablet (200 mg total) by mouth daily. 08/17/19    Dwyane Dee, MD  apixaban (ELIQUIS) 2.5 MG TABS tablet Take 2.5 mg by mouth 2 (two) times daily.    [provider]  brimonidine-timolol (COMBIGAN) 0.2-0.5 % ophthalmic solution Place 1 drop into both eyes 2 (two) times daily.    [provider]  brinzolamide (AZOPT) 1 % ophthalmic suspension Place 1 drop into the right eye 3 (three) times daily.  09/03/16   [provider]  carbidopa-levodopa (SINEMET IR) 25-100 MG tablet Take 1 tablet by mouth 3 (three) times daily.     [provider]  Carboxymethylcellulose Sodium (ARTIFICIAL TEARS OP) Place 1 drop into both eyes 4 (four) times daily.    [provider]  Cholecalciferol (VITAMIN D-3) 5000 units TABS Take 5,000 Units by mouth daily.    [provider]  clopidogrel (PLAVIX) 75 MG tablet Take 75 mg by mouth daily.     [provider]  colesevelam (WELCHOL) 625 MG tablet Take 1,875 mg by mouth every evening.     [provider]  Cyanocobalamin (VITAMIN B-12) 2500 MCG SUBL Place 1 tablet under the tongue daily.    [provider]  diltiazem (CARDIZEM CD) 120 MG 24 hr capsule Take 1 capsule (120 mg total) by mouth daily for 30 days. 03/12/18   Elodia Florence., MD  docusate sodium (COLACE) 100 MG capsule Take 100 mg  by mouth 2 (two) times daily.     [provider]  ferrous sulfate 325 (65 FE) MG EC tablet Take 1 tablet (325 mg total) by mouth 2 (two) times daily with a meal. 11/14/18 11/14/19  Danford, Suann Larry, MD  fluticasone (FLONASE) 50 MCG/ACT nasal spray Place 2 sprays into both nostrils daily.    [provider]  furosemide (LASIX) 20 MG tablet Take by mouth. 03/05/20   [provider]  latanoprost (XALATAN) 0.005 % ophthalmic solution Place 1 drop into the right eye at bedtime.  07/28/16   [provider]  levocetirizine (XYZAL) 5 MG tablet Take 2.5 mg by mouth at bedtime.  04/15/18   [provider]   levothyroxine (SYNTHROID) 150 MCG tablet Take 150 mcg by mouth daily.     [provider]  Melatonin 3 MG SUBL Place 3 mg under the tongue at bedtime. 11/11/18   [provider]  Meth-Hyo-M Bl-Na Phos-Ph Sal (URO-MP) 118 MG CAPS Take 1 capsule by mouth 3 (three) times daily.    [provider]  metoprolol succinate (TOPROL-XL) 25 MG 24 hr tablet Take 1 tablet (25 mg total) by mouth daily. 08/06/19   Dwyane Dee, MD  mirabegron ER (MYRBETRIQ) 50 MG TB24 tablet Take 1 tablet (50 mg total) by mouth daily. 01/28/16   McKenzie, Candee Furbish, MD  Multiple Vitamins-Minerals (PRESERVISION/LUTEIN) CAPS Take 1 capsule by mouth 2 (two) times daily.    [provider]  pantoprazole (PROTONIX) 40 MG tablet Take 40 mg by mouth daily. 06/29/19   [provider]  polyethylene glycol (MIRALAX / GLYCOLAX) packet Take 17 g by mouth daily. [place]    [provider]  Potassium Chloride ER 20 MEQ TBCR Take 1 tablet by mouth daily. 11/20/19   [provider]  sertraline (ZOLOFT) 25 MG tablet Take 25 mg by mouth daily.    [provider]  traMADol (ULTRAM) 50 MG tablet Take 50 mg by mouth every 6 (six) hours as needed. 05/07/20   [provider]      Allergies    Colchicine, Hibiclens [chlorhexidine], Atorvastatin, Ceftin, Celebrex [celecoxib], Codeine, Erythromycin, Ezetimibe, Iodinated contrast media, Oxycodone-acetaminophen, Rosuvastatin, Simvastatin, Cefuroxime, Celebrex [celecoxib], Codeine, Crestor [rosuvastatin], Erythromycin, Lipitor [atorvastatin], Nitroglycerin, Percocet [oxycodone-acetaminophen], Protonix [pantoprazole], Tetanus toxoids, Cefpodoxime, Cefuroxime axetil, Nitroglycerin, and Vantin    Review of Systems   Review of Systems Level 5 caveat cannot obtain review of systems due to dementia. Physical Exam Updated Vital Signs BP (!) 183/94    Pulse 73    Temp 98.4 F (36.9 C) (Oral)    Resp (!) 25    Ht '5\' 10"'$  (1.778  m)    Wt 73 kg    SpO2 92%    BMI 23.10 kg/m  Physical Exam Constitutional:      Comments: Patient is awake and alert.  he does not appear to be in distress lying in the stretcher.  No respiratory distress.  He does interact but his responses to questions do not correlate with the current situation, however with hearing aids he seems to do better.  He can then discuss more about his hip pain.  HENT:     Head: Normocephalic and atraumatic.     Mouth/Throat:     Pharynx: Oropharynx is clear.  Eyes:     Extraocular Movements: Extraocular movements intact.     Conjunctiva/sclera: Conjunctivae normal.  Cardiovascular:     Rate and Rhythm: Normal rate.     Comments:  Rhythm is regular with occasional ectopic beat.  No gross rub or gallop. Pulmonary:     Effort: Pulmonary effort is normal.     Breath sounds: Normal breath sounds.  Abdominal:     General: There is no distension.     Palpations: Abdomen is soft.     Tenderness: There is no abdominal tenderness. There is no guarding.  Musculoskeletal:     Comments: No contusions or abrasions to the patient's back sacrum buttocks or hips.  No soft tissue abnormalities.  Patient variably is complaining of a lot of pain and putting his hand right at about the area above the greater trochanter and a little bit on to the low back.  On the left.  Patient is uncomfortable and is hard for me to localize by palpating anything more than the area he indicates.  No evident deformities.  The lower extremities are symmetric, warm and dry.  Distal pulses are 2+ and symmetric  Skin:    General: Skin is warm and dry.  Neurological:     Comments: Patient is alert.  He has responses indicate dementia or hard of hearing.  Is difficult for him to follow commands.  He does not appear to have any focal motor deficits.  Times he appears to hear fairly well and seems to understand but communication is a bit challenging.    ED Results / Procedures / Treatments    Labs (all labs ordered are listed, but only abnormal results are displayed) Labs Reviewed  URINALYSIS, ROUTINE W REFLEX MICROSCOPIC - Abnormal; Notable for the following components:      Result Value   Color, Urine BLUE (*)    Leukocytes,Ua TRACE (*)    All other components within normal limits  COMPREHENSIVE METABOLIC PANEL - Abnormal; Notable for the following components:   Glucose, Bld 157 (*)    BUN 39 (*)    Creatinine, Ser 2.08 (*)    Calcium 8.3 (*)    Total Protein 6.4 (*)    Albumin 3.4 (*)    AST 9 (*)    GFR, Estimated 30 (*)    All other components within normal limits  BRAIN NATRIURETIC PEPTIDE - Abnormal; Notable for the following components:   B Natriuretic Peptide 287.6 (*)    All other components within normal limits  CBC WITH DIFFERENTIAL/PLATELET - Abnormal; Notable for the following components:   RBC 3.18 (*)    Hemoglobin 10.2 (*)    HCT 32.4 (*)    MCV 101.9 (*)    All other components within normal limits  D-DIMER, QUANTITATIVE - Abnormal; Notable for the following components:   D-Dimer, Quant 0.93 (*)    All other components within normal limits  PROTIME-INR - Abnormal; Notable for the following components:   Prothrombin Time 15.6 (*)    All other components within normal limits  RESP PANEL BY RT-PCR (FLU A&B, COVID) ARPGX2  CULTURE, BLOOD (ROUTINE X 2)  CULTURE, BLOOD (ROUTINE X 2)  LACTIC ACID, PLASMA  LIPASE, BLOOD  TROPONIN I (HIGH SENSITIVITY)  TROPONIN I (HIGH SENSITIVITY)    EKG None  Radiology DG Chest 1 View  Result Date: 03/20/2021 CLINICAL DATA:  CT of flank and back pain. EXAM: CHEST  1 VIEW COMPARISON:  07/05/2020 FINDINGS: Upper normal heart size. Retrocardiac hiatal hernia. Airspace opacity at the right lung base. There also patchy opacities at the left lung base this is superimposed on chronic interstitial changes in the periphery of the left lung base. Limited  assessment for pleural effusion. No pneumothorax. The bones are  diffusely under mineralized. The left seventh posterior rib is poorly visualized which is nonspecific. IMPRESSION: 1. Bibasilar airspace opacities, right greater than left, suspicious for pneumonia, including aspiration. 2. Background left lung base scarring. 3. Retrocardiac hiatal hernia. 4. The left posterior seventh rib is poorly visualized, nonspecific. This is likely due to overlapping structures, although the possibility of rib destruction is difficult to exclude in the setting of left flank pain. Electronically Signed   By: Keith Rake M.D.   On: 03/20/2021 18:11   CT Chest Wo Contrast  Result Date: 03/20/2021 CLINICAL DATA:  Left-sided chest pain cough EXAM: CT CHEST WITHOUT CONTRAST TECHNIQUE: Multidetector CT imaging of the chest was performed following the standard protocol without IV contrast. RADIATION DOSE REDUCTION: This exam was performed according to the departmental dose-optimization program which includes automated exposure control, adjustment of the mA and/or kV according to patient size and/or use of iterative reconstruction technique. COMPARISON:  Chest x-ray from earlier in the same day. FINDINGS: Cardiovascular: Somewhat limited due to lack of IV contrast. Calcifications of the thoracic aorta are noted. Findings of prior chronic dissection are seen the proximal descending thoracic aorta. No aneurysmal dilatation is noted. Coronary calcifications are seen. No cardiac enlargement is noted. Mediastinum/Nodes: Thoracic inlet is within normal limits. No sizable hilar or mediastinal adenopathy is seen. The esophagus as visualized is within normal limits with the exception of a moderate-sized hiatal hernia. Lungs/Pleura: Lungs are well aerated bilaterally. Patchy airspace opacities are identified in both lungs worst in the right lower lobe and right middle lobe consistent with multifocal pneumonia. No sizable effusion is seen. No sizable parenchymal nodules are noted. Upper Abdomen:  Visualized upper abdomen shows evidence of cholelithiasis without complicating factors. Renal cystic change is seen. Musculoskeletal: Degenerative changes of the thoracic spine are noted. No rib abnormality is seen. IMPRESSION: Changes consistent with multifocal pneumonia worst in the right middle lobe and right lower lobe Cholelithiasis without complicating factors. Changes consistent with chronic dissection in the descending thoracic aorta. Aortic Atherosclerosis (ICD10-I70.0). Electronically Signed   By: Inez Catalina M.D.   On: 03/20/2021 20:25   CT RENAL STONE STUDY  Result Date: 03/20/2021 CLINICAL DATA:  Left-sided flank pain EXAM: CT ABDOMEN AND PELVIS WITHOUT CONTRAST TECHNIQUE: Multidetector CT imaging of the abdomen and pelvis was performed following the standard protocol without IV contrast. RADIATION DOSE REDUCTION: This exam was performed according to the departmental dose-optimization program which includes automated exposure control, adjustment of the mA and/or kV according to patient size and/or use of iterative reconstruction technique. COMPARISON:  12/01/2018 FINDINGS: Lower chest: Known multifocal pneumonia is again identified. Hepatobiliary: Cholelithiasis is seen. The liver is within normal limits. Pancreas: Cystic changes are again seen in the head of the pancreas stable from the prior exam. The largest of these measures 17 mm in greatest dimension. Spleen: Normal in size without focal abnormality. Adrenals/Urinary Tract: Adrenal glands are within normal limits. Multiple cysts are identified throughout both kidneys. The dominant cyst in the lower pole of the left kidney on the prior study has reduced in size. A dominant cyst on the right anteriorly measuring up to 8.9 cm is again noted and stable. Stable nonobstructing lower pole renal calculi are noted on the left measuring up to 11 mm. No right renal calculi are seen. The ureters are within normal limits. Bladder is well distended.  Stomach/Bowel: No obstructive or inflammatory changes of the colon are seen. The appendix is not  well visualized although no inflammatory changes are seen. Large hiatal hernia is noted. The small bowel is unremarkable. Vascular/Lymphatic: Aortic atherosclerosis. No enlarged abdominal or pelvic lymph nodes. Reproductive: Prostate is unremarkable. Other: No abdominal wall hernia or abnormality. No abdominopelvic ascites. Musculoskeletal: Degenerative changes of lumbar spine are noted. Chronic L1 compression deformity is noted. IMPRESSION: Multifocal pneumonia similar to that seen on recent chest CT. Cholelithiasis without complicating factors. Cystic changes in the kidneys and pancreas stable from the prior exam with the exception of reduction in size in a lower pole left renal cyst. Nonobstructing renal calculi in the lower pole of the left kidney measuring up to 11 mm. No obstructive changes are seen. Aortic Atherosclerosis (ICD10-I70.0). Electronically Signed   By: Inez Catalina M.D.   On: 03/20/2021 20:29   DG Hip Unilat With Pelvis 2-3 Views Left  Result Date: 03/20/2021 CLINICAL DATA:  Flank pain. EXAM: DG HIP (WITH OR WITHOUT PELVIS) 2-3V LEFT COMPARISON:  Left hip CT 08/13/2017 FINDINGS: The bones are under mineralized which limits assessment. No acute fracture. Mild left hip joint space narrowing and spurring. The pubic rami are grossly intact. No erosion, avascular necrosis, or bony destruction. Prior left inguinal hernia repair. IMPRESSION: Chronic osteoarthritis without acute osseous abnormality. Osteopenia/osteoporosis limits assessment. Electronically Signed   By: Keith Rake M.D.   On: 03/20/2021 18:09    Procedures Procedures    Medications Ordered in ED Medications  vancomycin (VANCOREADY) IVPB 1750 mg/350 mL (1,750 mg Intravenous New Bag/Given 03/20/21 2045)  traMADol (ULTRAM) tablet 50 mg (50 mg Oral Given 03/20/21 2039)  acetaminophen (TYLENOL) tablet 1,000 mg (1,000 mg Oral Given  03/20/21 2038)  aztreonam (AZACTAM) 2 g in sodium chloride 0.9 % 100 mL IVPB (0 g Intravenous Stopped 03/20/21 2124)    ED Course/ Medical Decision Making/ A&P                           Medical Decision Making Amount and/or Complexity of Data Reviewed Labs: ordered. Radiology: ordered.  Risk OTC drugs. Prescription drug management. Decision regarding hospitalization.   Presents with severe left hip and flank pain for about 2 days.  He has been functional with the hip earlier today.  Reportedly staff from [facility] noted the patient to go to lunch and breakfast without signs of significant pain.  However there was some report of pain yesterday and seem to have decreased activity.  No reported injuries.  Patient does not recall any injuries or falls.  Patient's pain is fairly severe.  CT scans used to evaluate for kidney stone, occult fracture.  Patient also notably has a very wet cough and is not being treated with antibiotics per review with [facility].  CT scan shows multifocal pneumonia.  Patient does complain of flank pain on the left perhaps this is pain secondary to pneumonia.  No obstructing kidney stones identified.  No significant UTI identified.  CT scan of the pelvis and abdomen reviewed by myself and directly visualized.  I do not appreciate any fractures within the pelvis or hip.  Radiology review also reviewed by myself.  Consultation was made with Precious at [facility] for additional history.  Given diffuse pneumonia and mild hypoxia with significant pain, will plan for admission for treatment of pneumonia in elderly individual with mobility issues and comorbid illness.  Consult: Dr. Trilby Drummer for admission        Final Clinical Impression(s) / ED Diagnoses Final diagnoses:  Healthcare-associated pneumonia  Left hip pain  Severe comorbid illness    Rx / DC Orders ED Discharge Orders     None         Charlesetta Shanks, MD 03/20/21 2208

## 2021-03-20 NOTE — ED Notes (Signed)
Patients sister called for an update: [name] [phone] ?

## 2021-03-20 NOTE — ED Triage Notes (Signed)
Pt BIB [facility] from [facility]. Pt reports left sided flank/back pain that started this morning. According to EMS pt was pale upon arrival. Pt received 136mg fentanyl and 40103mfluid en route.  ?

## 2021-03-21 DIAGNOSIS — Y95 Nosocomial condition: Secondary | ICD-10-CM | POA: Diagnosis present

## 2021-03-21 DIAGNOSIS — J1289 Other viral pneumonia: Secondary | ICD-10-CM | POA: Diagnosis present

## 2021-03-21 DIAGNOSIS — Z7902 Long term (current) use of antithrombotics/antiplatelets: Secondary | ICD-10-CM | POA: Diagnosis not present

## 2021-03-21 DIAGNOSIS — I129 Hypertensive chronic kidney disease with stage 1 through stage 4 chronic kidney disease, or unspecified chronic kidney disease: Secondary | ICD-10-CM | POA: Diagnosis present

## 2021-03-21 DIAGNOSIS — I10 Essential (primary) hypertension: Secondary | ICD-10-CM | POA: Diagnosis not present

## 2021-03-21 DIAGNOSIS — N1832 Chronic kidney disease, stage 3b: Secondary | ICD-10-CM

## 2021-03-21 DIAGNOSIS — E039 Hypothyroidism, unspecified: Secondary | ICD-10-CM | POA: Diagnosis present

## 2021-03-21 DIAGNOSIS — Z8673 Personal history of transient ischemic attack (TIA), and cerebral infarction without residual deficits: Secondary | ICD-10-CM | POA: Diagnosis not present

## 2021-03-21 DIAGNOSIS — Z885 Allergy status to narcotic agent status: Secondary | ICD-10-CM | POA: Diagnosis not present

## 2021-03-21 DIAGNOSIS — E1122 Type 2 diabetes mellitus with diabetic chronic kidney disease: Secondary | ICD-10-CM | POA: Diagnosis present

## 2021-03-21 DIAGNOSIS — R410 Disorientation, unspecified: Secondary | ICD-10-CM | POA: Diagnosis present

## 2021-03-21 DIAGNOSIS — I2510 Atherosclerotic heart disease of native coronary artery without angina pectoris: Secondary | ICD-10-CM | POA: Diagnosis present

## 2021-03-21 DIAGNOSIS — G20 Parkinson's disease: Secondary | ICD-10-CM | POA: Diagnosis present

## 2021-03-21 DIAGNOSIS — D631 Anemia in chronic kidney disease: Secondary | ICD-10-CM | POA: Diagnosis present

## 2021-03-21 DIAGNOSIS — I480 Paroxysmal atrial fibrillation: Secondary | ICD-10-CM | POA: Diagnosis present

## 2021-03-21 DIAGNOSIS — K219 Gastro-esophageal reflux disease without esophagitis: Secondary | ICD-10-CM | POA: Diagnosis present

## 2021-03-21 DIAGNOSIS — H353230 Exudative age-related macular degeneration, bilateral, stage unspecified: Secondary | ICD-10-CM | POA: Diagnosis present

## 2021-03-21 DIAGNOSIS — Z888 Allergy status to other drugs, medicaments and biological substances status: Secondary | ICD-10-CM | POA: Diagnosis not present

## 2021-03-21 DIAGNOSIS — Z515 Encounter for palliative care: Secondary | ICD-10-CM | POA: Diagnosis not present

## 2021-03-21 DIAGNOSIS — J189 Pneumonia, unspecified organism: Secondary | ICD-10-CM | POA: Diagnosis present

## 2021-03-21 DIAGNOSIS — E785 Hyperlipidemia, unspecified: Secondary | ICD-10-CM | POA: Diagnosis present

## 2021-03-21 DIAGNOSIS — Z20822 Contact with and (suspected) exposure to covid-19: Secondary | ICD-10-CM | POA: Diagnosis present

## 2021-03-21 DIAGNOSIS — D638 Anemia in other chronic diseases classified elsewhere: Secondary | ICD-10-CM | POA: Diagnosis not present

## 2021-03-21 DIAGNOSIS — F0280 Dementia in other diseases classified elsewhere without behavioral disturbance: Secondary | ICD-10-CM | POA: Diagnosis present

## 2021-03-21 DIAGNOSIS — B9730 Unspecified retrovirus as the cause of diseases classified elsewhere: Secondary | ICD-10-CM | POA: Diagnosis present

## 2021-03-21 DIAGNOSIS — Z881 Allergy status to other antibiotic agents status: Secondary | ICD-10-CM | POA: Diagnosis not present

## 2021-03-21 DIAGNOSIS — I71012 Dissection of descending thoracic aorta: Secondary | ICD-10-CM | POA: Diagnosis present

## 2021-03-21 DIAGNOSIS — Z66 Do not resuscitate: Secondary | ICD-10-CM | POA: Diagnosis present

## 2021-03-21 LAB — RESPIRATORY PANEL BY PCR

## 2021-03-21 LAB — CBC
HCT: 34.8 % — ABNORMAL LOW (ref 39.0–52.0)
Hemoglobin: 10.7 g/dL — ABNORMAL LOW (ref 13.0–17.0)
MCH: 31.4 pg (ref 26.0–34.0)
MCHC: 30.7 g/dL (ref 30.0–36.0)
MCV: 102.1 fL — ABNORMAL HIGH (ref 80.0–100.0)
Platelets: 209 10*3/uL (ref 150–400)
RBC: 3.41 MIL/uL — ABNORMAL LOW (ref 4.22–5.81)
RDW: 12.5 % (ref 11.5–15.5)
WBC: 10 10*3/uL (ref 4.0–10.5)
nRBC: 0 % (ref 0.0–0.2)

## 2021-03-21 LAB — BASIC METABOLIC PANEL
Anion gap: 9 (ref 5–15)
BUN: 36 mg/dL — ABNORMAL HIGH (ref 8–23)
CO2: 24 mmol/L (ref 22–32)
Calcium: 8.3 mg/dL — ABNORMAL LOW (ref 8.9–10.3)
Chloride: 105 mmol/L (ref 98–111)
Creatinine, Ser: 2.07 mg/dL — ABNORMAL HIGH (ref 0.61–1.24)
GFR, Estimated: 30 mL/min — ABNORMAL LOW (ref >60)
Glucose, Bld: 142 mg/dL — ABNORMAL HIGH (ref 70–99)
Potassium: 3.6 mmol/L (ref 3.5–5.1)
Sodium: 138 mmol/L (ref 135–145)

## 2021-03-21 LAB — GLUCOSE, CAPILLARY
Glucose-Capillary: 94 mg/dL (ref 70–99)
Glucose-Capillary: 134 mg/dL — ABNORMAL HIGH (ref 70–99)

## 2021-03-21 LAB — PROCALCITONIN
Procalcitonin: <0.1 ng/mL
Procalcitonin: <0.1 ng/mL

## 2021-03-21 MED ORDER — METHYLPREDNISOLONE SODIUM SUCC 40 MG IJ SOLR
40.0000 mg | Freq: Every day | INTRAMUSCULAR | Status: DC
  Administered 2021-03-21 – 2021-03-22 (×2): 40 mg via INTRAVENOUS
  Filled 2021-03-21 (×2): qty 1

## 2021-03-21 MED ORDER — GUAIFENESIN-DM 100-10 MG/5ML PO SYRP: 10.0000 mL | ORAL_SOLUTION | ORAL | Status: DC | PRN

## 2021-03-21 MED ORDER — ORAL CARE MOUTH RINSE
15.0000 mL | Freq: Two times a day (BID) | OROMUCOSAL | Status: DC
  Administered 2021-03-21 – 2021-03-23 (×6): 15 mL via OROMUCOSAL

## 2021-03-21 NOTE — Progress Notes (Signed)
?  Progress Note ? ? ?Patient: [name] FAO:[id number] DOB: 04-04-1931 DOA: 03/20/2021     0 ?DOS: the patient was seen and examined on 03/21/2021 ?  ?Brief hospital course: ?88 y.o. male with medical history significant of diabetes, hypothyroidism, anemia, atrial fibrillation, CAD, hyperlipidemia, GERD, CVA, CKD 3, hypertension, Parkinson disease, ataxia presented with flank pain.  Work-up revealed bibasilar opacities right greater than left suspicious for pneumonia.  CT of the chest showed multifocal pneumonia with chronic descending aortic dissection.  He was started on IV antibiotics. ? ?Assessment and Plan: ?* Pneumonia ?- Found to have multifocal pneumonia on imaging.  COVID-19 and influenza testing negative ?-Respiratory virus panel positive for rhinovirus.  Currently on IV antibiotics: Procalcitonin less than 0.1.  DC antibiotics as this is most likely viral pneumonia.  Blood cultures negative so far. ?-Start Solu-Medrol 40 mg IV daily. ? ?Type II diabetes mellitus with renal manifestations ([place]) ?- Continue CBGs with SSI ? ?CKD (chronic kidney disease), stage III ([place]) ?CKD stage IIIb ?-Creatinine at baseline.  Monitor ? ?Paroxysmal atrial fibrillation (HCC) ?- Continue Cardizem and metoprolol. ? ?Benign essential HTN ?- Continue Cardizem and metoprolol. ? ?Parkinson's disease ([place]) ?- Continue home Sinemet.  PT eval. ? ?Coronary artery disease involving native coronary artery of native heart without angina pectoris ?Hyperlipidemia ?-Continue Plavix, metoprolol.  Outpatient follow-up with cardiology.  No chest pain. ? ?History of CVA (cerebrovascular accident) ?- Continue Plavix ? ?Hypothyroidism ?- Continue Synthroid ? ?Anemia of chronic disease ?- From chronic illnesses.  Hemoglobin stable.  Monitor intermittently. ? ?Exudative age-related macular degeneration ([place]) ?- Continue home eyedrops ? ? ? ? ?  ? ?Subjective:  ?Patient seen and examined at bedside.  Poor historian.  Feels slightly better.   Breathing is improving.  No overnight fever, chest pain, vomiting reported. ? ?Physical Exam: ?Vitals:  ? 03/20/21 2330 03/21/21 0330 03/21/21 0339 03/21/21 6962  ?BP:  (!) 177/87  (!) 155/90  ?Pulse:  69  66  ?Resp:  20  14  ?Temp:  97.8 ?F (36.6 ?C)  97.8 ?F (36.6 ?C)  ?TempSrc:  Oral  Oral  ?SpO2:  95%  100%  ?Weight: 69.1 kg  68.8 kg   ?Height: '5\' 11"'$  (1.803 m)     ? ?General: No acute distress, currently on room air ?ENT/neck: No elevated JVD.  No obvious masses  ?respiratory: Bilateral decreased breath sounds at bases with some scattered crackles ?CVS: S1-S2 heard, rate controlled ?Abdominal: Soft, nontender, nondistended, no organomegaly, bowel sounds heard ?Extremities: No cyanosis, clubbing, edema ?CNS: Alert, awake; extremely slow to respond; poor historian.  No focal neurologic deficit.  Moving extremities. ?Lymph: No cervical lymphadenopathy ?Skin: No rashes, lesions, ulcers ?Psych: Affect is mostly flat.  No signs of agitation.  Musculoskeletal: No obvious joint deformity/tenderness/swelling ? ?Data Reviewed: ?I have myself reviewed patient's investigations including imaging and blood work during this hospitalization.  Respiratory virus panel is positive for rhinovirus.  Blood cultures negative so far.  Today, sodium is 138, creatinine 2.07, procalcitonin less than 0.1, WBCs 10, hemoglobin 10.7, MCV 102.1 ? ? ?Family Communication: None at bedside ? ?Disposition: ?Status is: Observation ?The patient will require care spanning > 2 midnights and should be moved to inpatient because: Of severity of illness.  Need for IV steroids.  PT eval. ? Planned Discharge Destination: Home.  [facility]  Possibly will return back.  Pending PT eval ? ? ? ?Author: ?Aline August, MD ?03/21/2021 10:48 AM ? ?For on call review [email or web address].  ?

## 2021-03-21 NOTE — Assessment & Plan Note (Signed)
-   From chronic illnesses.  Hemoglobin stable.  Monitor intermittently. ?

## 2021-03-21 NOTE — Assessment & Plan Note (Addendum)
-   Continue home Sinemet.   ?-PT recommends SNF.  Social worker consulted. ?

## 2021-03-21 NOTE — Assessment & Plan Note (Signed)
CKD stage IIIb ?-Creatinine at baseline.  Monitor ?

## 2021-03-21 NOTE — Assessment & Plan Note (Signed)
Continue Synthroid °

## 2021-03-21 NOTE — Assessment & Plan Note (Signed)
-   Continue Cardizem and metoprolol. ?

## 2021-03-21 NOTE — Assessment & Plan Note (Signed)
-   Continue CBGs with SSI ?

## 2021-03-21 NOTE — Assessment & Plan Note (Signed)
Continue home eyedrops °

## 2021-03-21 NOTE — Assessment & Plan Note (Addendum)
-   Found to have multifocal pneumonia on imaging.  COVID-19 and influenza testing negative ?-Respiratory virus panel positive for rhinovirus. Procalcitonin less than 0.1.  DC'd antibiotics on 03/21/2021 as this is most likely viral pneumonia.  Blood cultures negative so far. ?-Continue Solu-Medrol 40 mg IV daily. ?-Currently on room air ?

## 2021-03-21 NOTE — Assessment & Plan Note (Signed)
Hyperlipidemia ?-Continue Plavix, metoprolol.  Outpatient follow-up with cardiology.  No chest pain. ?

## 2021-03-21 NOTE — Hospital Course (Addendum)
88 y.o. male with medical history significant of diabetes, hypothyroidism, anemia, atrial fibrillation, CAD, hyperlipidemia, GERD, CVA, CKD 3, hypertension, Parkinson disease, ataxia presented with flank pain.  Work-up revealed bibasilar opacities right greater than left suspicious for pneumonia.  CT of the chest showed multifocal pneumonia with chronic descending aortic dissection.  He was started on IV antibiotics.  Subsequently, respiratory virus panel was positive for rhinovirus.  IV antibiotics were discontinued and he was started on Solu-Medrol.  PT recommended SNF placement. ?

## 2021-03-21 NOTE — Evaluation (Signed)
Physical Therapy Evaluation ?Patient Details ?Name: [name] ?MRN: [id number] ?DOB: [birth year] ?Today's Date: 03/21/2021 ? ?History of Present Illness ? 88 y.o. male with medical history significant of diabetes, hypothyroidism, anemia, atrial fibrillation, CAD, hyperlipidemia, GERD, CVA, CKD 3, hypertension, Parkinson disease, ataxia presenting with flank pain, admitted with pna  ?Clinical Impression ? Pt admitted with above diagnosis.  ?Pt from [facility], pt states he was mod I amb with rollator (question accuracy of this). Today pt requiring max assist with bed mobility, mod to max assist with sit<>stand transfers. Pt fatigued after minimal mobility, HR in 80s but noted DOE. Pt will likely need SNF post acute unless ALF able to provide needed assist.  Continue to follow in acute setting  ? ? Pt currently with functional limitations due to the deficits listed below (see PT Problem List). Pt will benefit from skilled PT to increase their independence and safety with mobility to allow discharge to the venue listed below.   ?   ?   ? ?Recommendations for follow up therapy are one component of a multi-disciplinary discharge planning process, led by the attending physician.  Recommendations may be updated based on patient status, additional functional criteria and insurance authorization. ? ?Follow Up Recommendations Skilled nursing-short term rehab (<3 hours/day) (unless ALF able to provide needed assist) ? ?  ?Assistance Recommended at Discharge Frequent or constant Supervision/Assistance  ?Patient can return home with the following ? A lot of help with bathing/dressing/bathroom;Assistance with cooking/housework;Direct supervision/assist for financial management;A little help with walking and/or transfers;Direct supervision/assist for medications management;Help with stairs or ramp for entrance;Assist for transportation;Assistance with feeding ? ?  ?Equipment Recommendations None recommended by PT   ?Recommendations for Other Services ?    ?  ?Functional Status Assessment Patient has had a recent decline in their functional status and demonstrates the ability to make significant improvements in function in a reasonable and predictable amount of time.  ? ?  ?Precautions / Restrictions Precautions ?Precautions: Fall ?Restrictions ?Weight Bearing Restrictions: No  ? ?  ? ?Mobility ? Bed Mobility ?Overal bed mobility: Needs Assistance ?Bed Mobility: Supine to Sit, Sit to Supine ?  ?  ?Supine to sit: Max assist, Total assist ?Sit to supine: Max assist ?  ?General bed mobility comments: assist to perform partial roll, bring LEs off bed and elevate trunk. assist to lift bil LEs on to bed, pt able to self assist with lowering trunk ?  ? ?Transfers ?Overall transfer level: Needs assistance ?Equipment used: Rolling walker (2 wheels) ?  ?  ?  ?  ?  ?  ?  ?General transfer comment: STS x3. stood x2  with L hand on back of recliner with mod assist; stood x1 with RW with max assist for wt transfer. cues for incr knee flexion and anterior wt shift ?  ? ?Ambulation/Gait ?  ?  ?  ?  ?  ?  ?  ?General Gait Details: unable ? ?Stairs ?  ?  ?  ?  ?  ? ?Wheelchair Mobility ?  ? ?Modified Rankin (Stroke Patients Only) ?  ? ?  ? ?Balance Overall balance assessment: Needs assistance ?Sitting-balance support: Feet supported, Single extremity supported, Feet unsupported ?Sitting balance-Leahy Scale: Fair ?Sitting balance - Comments: initially requirin gmin to mod assist, with incr time able to progress to supervision ?  ?  ?Standing balance-Leahy Scale: Poor ?  ?  ?  ?  ?  ?  ?  ?  ?  ?  ?  ?  ?   ? ? ? ?  Pertinent Vitals/Pain Pain Assessment ?Pain Assessment: Faces ?Faces Pain Scale: Hurts little more ?Pain Location: L flank ?Pain Descriptors / Indicators: Discomfort, Sore ?Pain Intervention(s): Repositioned, Monitored during session, Limited activity within patient's tolerance  ? ? ?Home Living Family/patient expects to be discharged  to:: Assisted living ?  ?  ?  ?  ?  ?  ?  ?  ?Home Equipment: Conservation officer, nature (2 wheels) ?Additional Comments: ALF at [facility]  ?  ?Prior Function   ?  ?  ?  ?  ?  ?  ?Mobility Comments: difficult to determine; pt reports amb with rollator ?ADLs Comments: assist with bathing per pt ?  ? ? ?Hand Dominance  ? Dominant Hand: Right ? ?  ?Extremity/Trunk Assessment  ? Upper Extremity Assessment ?Upper Extremity Assessment: Generalized weakness ?  ? ?Lower Extremity Assessment ?Lower Extremity Assessment: Generalized weakness ?  ? ?   ?Communication  ? Communication: HOH  ?Cognition Arousal/Alertness: Awake/alert ?Behavior During Therapy: [facility] for tasks assessed/performed ?Overall Cognitive Status: No family/caregiver present to determine baseline cognitive functioning ?  ?  ?  ?  ?  ?  ?  ?  ?  ?  ?  ?  ?  ?  ?  ?  ?  ?  ?  ? ?  ?General Comments   ? ?  ?Exercises    ? ?Assessment/Plan  ?  ?PT Assessment Patient needs continued PT services  ?PT Problem List Decreased strength;Decreased activity tolerance;Decreased mobility;Decreased range of motion;Decreased balance;Decreased coordination ? ?   ?  ?PT Treatment Interventions DME instruction;Therapeutic exercise;Gait training;Balance training;Functional mobility training;Therapeutic activities;Patient/family education;Neuromuscular re-education   ? ?PT Goals (Current goals can be found in the Care Plan section)  ?Acute Rehab PT Goals ?PT Goal Formulation: Patient unable to participate in goal setting ?Time For Goal Achievement: 04/04/21 ?Potential to Achieve Goals: Fair ? ?  ?Frequency Min 2X/week ?  ? ? ?Co-evaluation   ?  ?  ?  ?  ? ? ?  ?AM-PAC PT "6 Clicks" Mobility  ?Outcome Measure Help needed turning from your back to your side while in a flat bed without using bedrails?: A Lot ?Help needed moving from lying on your back to sitting on the side of a flat bed without using bedrails?: A Lot ?Help needed moving to and from a bed to a chair (including a wheelchair)?:  A Lot ?Help needed standing up from a chair using your arms (e.g., wheelchair or bedside chair)?: A Lot ?Help needed to walk in hospital room?: Total ?Help needed climbing 3-5 steps with a railing? : Total ?6 Click Score: 10 ? ?  ?End of Session   ?Activity Tolerance: Patient tolerated treatment well;Patient limited by fatigue ?Patient left: in bed;with call bell/phone within reach;with bed alarm set ?  ?PT Visit Diagnosis: Other abnormalities of gait and mobility (R26.89);Muscle weakness (generalized) (M62.81);Other symptoms and signs involving the nervous system (R29.898) ?  ? ?Time: 4970-2637 ?PT Time Calculation (min) (ACUTE ONLY): 21 min ? ? ?Charges:   PT Evaluation ?$PT Eval Low Complexity: 1 Low ?  ?  ?   ? ? ?Baxter Flattery, PT ? ?Acute Rehab Dept [facility]) [phone] ?Pager [phone] ? ?03/21/2021 ? ? ?Ivy Meriwether ?03/21/2021, 11:27 AM ? ?

## 2021-03-21 NOTE — Assessment & Plan Note (Signed)
Continue Plavix.  ?

## 2021-03-22 DIAGNOSIS — N1832 Chronic kidney disease, stage 3b: Secondary | ICD-10-CM | POA: Diagnosis not present

## 2021-03-22 DIAGNOSIS — J189 Pneumonia, unspecified organism: Secondary | ICD-10-CM | POA: Diagnosis not present

## 2021-03-22 DIAGNOSIS — Z8673 Personal history of transient ischemic attack (TIA), and cerebral infarction without residual deficits: Secondary | ICD-10-CM | POA: Diagnosis not present

## 2021-03-22 DIAGNOSIS — G20 Parkinson's disease: Secondary | ICD-10-CM | POA: Diagnosis not present

## 2021-03-22 LAB — PROCALCITONIN: Procalcitonin: <0.1 ng/mL

## 2021-03-22 LAB — BASIC METABOLIC PANEL
Anion gap: 11 (ref 5–15)
BUN: 39 mg/dL — ABNORMAL HIGH (ref 8–23)
CO2: 22 mmol/L (ref 22–32)
Calcium: 8.6 mg/dL — ABNORMAL LOW (ref 8.9–10.3)
Chloride: 103 mmol/L (ref 98–111)
Creatinine, Ser: 1.7 mg/dL — ABNORMAL HIGH (ref 0.61–1.24)
GFR, Estimated: 39 mL/min — ABNORMAL LOW (ref >60)
Glucose, Bld: 149 mg/dL — ABNORMAL HIGH (ref 70–99)
Potassium: 3.8 mmol/L (ref 3.5–5.1)
Sodium: 136 mmol/L (ref 135–145)

## 2021-03-22 LAB — GLUCOSE, CAPILLARY
Glucose-Capillary: 136 mg/dL — ABNORMAL HIGH (ref 70–99)
Glucose-Capillary: 140 mg/dL — ABNORMAL HIGH (ref 70–99)
Glucose-Capillary: 144 mg/dL — ABNORMAL HIGH (ref 70–99)
Glucose-Capillary: 185 mg/dL — ABNORMAL HIGH (ref 70–99)

## 2021-03-22 LAB — MAGNESIUM: Magnesium: 2 mg/dL (ref 1.7–2.4)

## 2021-03-22 MED ORDER — HALOPERIDOL LACTATE 5 MG/ML IJ SOLN
1.0000 mg | Freq: Four times a day (QID) | INTRAMUSCULAR | Status: DC | PRN
  Administered 2021-03-22 – 2021-03-23 (×2): 1 mg via INTRAVENOUS
  Filled 2021-03-22 (×2): qty 1

## 2021-03-22 MED ORDER — LORAZEPAM 2 MG/ML IJ SOLN: 1.0000 mg | Freq: Four times a day (QID) | INTRAMUSCULAR | Status: DC | PRN

## 2021-03-22 NOTE — Progress Notes (Signed)
Pt agitated at this time. Pt refused CBG multiple times.  ?

## 2021-03-22 NOTE — Progress Notes (Signed)
Pt agitated at this time. Pt became aggressive/ with this RN. Security called. Charge RN present at bedside. MD paged. Family called (sister [name]), via telephone for an update.  ?

## 2021-03-22 NOTE — Plan of Care (Signed)
No acute events overnight. ?Problem: Activity: ?Goal: Ability to tolerate increased activity will improve ?Outcome: Progressing ?  ?Problem: Clinical Measurements: ?Goal: Ability to maintain a body temperature in the normal range will improve ?Outcome: Progressing ?  ?Problem: Respiratory: ?Goal: Ability to maintain adequate ventilation will improve ?Outcome: Progressing ?Goal: Ability to maintain a clear airway will improve ?Outcome: Progressing ?  ?Problem: Education: ?Goal: Knowledge of General Education information will improve ?Description: Including pain rating scale, medication(s)/side effects and non-pharmacologic comfort measures ?Outcome: Progressing ?  ?

## 2021-03-22 NOTE — Progress Notes (Signed)
?  Progress Note ? ? ?Patient: [name] NLG:[id number] DOB: 1931/02/22 DOA: 03/20/2021     1 ?DOS: the patient was seen and examined on 03/22/2021 ?  ?Brief hospital course: ?88 y.o. male with medical history significant of diabetes, hypothyroidism, anemia, atrial fibrillation, CAD, hyperlipidemia, GERD, CVA, CKD 3, hypertension, Parkinson disease, ataxia presented with flank pain.  Work-up revealed bibasilar opacities right greater than left suspicious for pneumonia.  CT of the chest showed multifocal pneumonia with chronic descending aortic dissection.  He was started on IV antibiotics.  Subsequently, respiratory virus panel was positive for rhinovirus.  IV antibiotics were discontinued and he was started on Solu-Medrol.  PT recommended SNF placement. ? ?Assessment and Plan: ?* Pneumonia ?- Found to have multifocal pneumonia on imaging.  COVID-19 and influenza testing negative ?-Respiratory virus panel positive for rhinovirus. Procalcitonin less than 0.1.  DC'd antibiotics on 03/21/2021 as this is most likely viral pneumonia.  Blood cultures negative so far. ?-Continue Solu-Medrol 40 mg IV daily. ?-Currently on room air ? ?Type II diabetes mellitus with renal manifestations ([place]) ?- Continue CBGs with SSI ? ?CKD (chronic kidney disease), stage III ([place]) ?CKD stage IIIb ?-Creatinine at baseline.  Monitor ? ?Paroxysmal atrial fibrillation (HCC) ?- Continue Cardizem and metoprolol. ? ?Benign essential HTN ?- Continue Cardizem and metoprolol. ? ?Parkinson's disease ([place]) ?- Continue home Sinemet.   ?-PT recommends SNF.  Social worker consulted. ? ?Coronary artery disease involving native coronary artery of native heart without angina pectoris ?Hyperlipidemia ?-Continue Plavix, metoprolol.  Outpatient follow-up with cardiology.  No chest pain. ? ?History of CVA (cerebrovascular accident) ?- Continue Plavix ? ?Hypothyroidism ?- Continue Synthroid ? ?Anemia of chronic disease ?- From chronic illnesses.  Hemoglobin  stable.  Monitor intermittently. ? ?Exudative age-related macular degeneration ([place]) ?- Continue home eyedrops ? ? ? ? ?  ? ?Subjective:  ?Patient seen and examined at bedside.  Poor historian. Slightly confused No fever, vomiting, worsening shortness of breath reported.  Feels weak.  Still intermittently coughing. ? ?Physical Exam: ?Vitals:  ? 03/21/21 1501 03/21/21 2039 03/22/21 0448 03/22/21 0449  ?BP: 131/82 122/76 (!) 142/81   ?Pulse: 79 60 64   ?Resp: '16 16 16   '$ ?Temp: 97.9 ?F (36.6 ?C) 97.7 ?F (36.5 ?C) 97.8 ?F (36.6 ?C)   ?TempSrc: Oral Axillary Oral   ?SpO2: 97% 94% 92%   ?Weight:    68.2 kg  ?Height:      ? ?General: On room air.  No distress ?ENT/neck: No thyromegaly.  JVD is not elevated  ?respiratory: Decreased breath sounds at bases bilaterally with some crackles; no wheezing CVS: S1-S2 heard, rate controlled ?Abdominal: Soft, nontender, slightly distended; no organomegaly, normal bowel sounds are heard ?Extremities: Trace lower extremity edema; no cyanosis  ?CNS: Awake and alert.  Extremely slow to respond.  Poor historian.  No focal neurologic deficit.  Moves extremities ?Lymph: No obvious lymphadenopathy ?Skin: No obvious ecchymosis/lesions  ?psych: Flat affect.  Currently not agitated ? musculoskeletal: No obvious joint swelling/deformity ? ?Data Reviewed: ?I have myself reviewed patient's investigations including imaging and blood work during this hospitalization.  Today sodium is 136, potassium 3.8, creatinine 1.7, magnesium 2.  Cultures have remained negative so far. ? ?Family Communication: None at bedside ? ?Disposition: ?Status is: Inpatient ?Remains inpatient appropriate because: Need for SNF placement.  Need for IV steroids ? Planned Discharge Destination: Skilled nursing facility ? ? ? ? ?Author: ?Aline August, MD ?03/22/2021 8:11 AM ? ?For on call review [email or web address].  ?

## 2021-03-23 DIAGNOSIS — R410 Disorientation, unspecified: Secondary | ICD-10-CM

## 2021-03-23 DIAGNOSIS — J189 Pneumonia, unspecified organism: Secondary | ICD-10-CM | POA: Diagnosis not present

## 2021-03-23 DIAGNOSIS — D638 Anemia in other chronic diseases classified elsewhere: Secondary | ICD-10-CM | POA: Diagnosis not present

## 2021-03-23 DIAGNOSIS — F0390 Unspecified dementia without behavioral disturbance: Secondary | ICD-10-CM

## 2021-03-23 DIAGNOSIS — I10 Essential (primary) hypertension: Secondary | ICD-10-CM | POA: Diagnosis not present

## 2021-03-23 DIAGNOSIS — N1832 Chronic kidney disease, stage 3b: Secondary | ICD-10-CM | POA: Diagnosis not present

## 2021-03-23 LAB — BASIC METABOLIC PANEL
Anion gap: 8 (ref 5–15)
BUN: 55 mg/dL — ABNORMAL HIGH (ref 8–23)
CO2: 25 mmol/L (ref 22–32)
Calcium: 8.7 mg/dL — ABNORMAL LOW (ref 8.9–10.3)
Chloride: 102 mmol/L (ref 98–111)
Creatinine, Ser: 2.1 mg/dL — ABNORMAL HIGH (ref 0.61–1.24)
GFR, Estimated: 30 mL/min — ABNORMAL LOW (ref >60)
Glucose, Bld: 222 mg/dL — ABNORMAL HIGH (ref 70–99)
Potassium: 4.2 mmol/L (ref 3.5–5.1)
Sodium: 135 mmol/L (ref 135–145)

## 2021-03-23 LAB — GLUCOSE, CAPILLARY
Glucose-Capillary: 254 mg/dL — ABNORMAL HIGH (ref 70–99)
Glucose-Capillary: 207 mg/dL — ABNORMAL HIGH (ref 70–99)
Glucose-Capillary: 221 mg/dL — ABNORMAL HIGH (ref 70–99)

## 2021-03-23 LAB — MAGNESIUM: Magnesium: 2.4 mg/dL (ref 1.7–2.4)

## 2021-03-23 MED ORDER — SODIUM CHLORIDE 0.9 % IV SOLN: INTRAVENOUS | Status: DC

## 2021-03-23 MED ORDER — PREDNISONE 20 MG PO TABS
40.0000 mg | ORAL_TABLET | Freq: Every day | ORAL | Status: DC
  Administered 2021-03-23 – 2021-03-24 (×2): 40 mg via ORAL
  Filled 2021-03-23 (×2): qty 2

## 2021-03-23 NOTE — Progress Notes (Signed)
?Progress Note ? ? ?Patient: [name] YDX:[id number] DOB: Sep 17, 1931 DOA: 03/20/2021     2 ?DOS: the patient was seen and examined on 03/23/2021 ?  ?Brief hospital course: ?88 y.o. male with medical history significant of diabetes, hypothyroidism, anemia, atrial fibrillation, CAD, hyperlipidemia, GERD, CVA, CKD 3, hypertension, Parkinson disease, ataxia presented with flank pain.  Work-up revealed bibasilar opacities right greater than left suspicious for pneumonia.  CT of the chest showed multifocal pneumonia with chronic descending aortic dissection.  He was started on IV antibiotics.  Subsequently, respiratory virus panel was positive for rhinovirus.  IV antibiotics were discontinued and he was started on Solu-Medrol.  PT recommended SNF placement. ? ?Assessment and Plan: ?* Pneumonia ?- Found to have multifocal pneumonia on imaging.  COVID-19 and influenza testing negative ?-Respiratory virus panel positive for rhinovirus. Procalcitonin less than 0.1.  DC'd antibiotics on 03/21/2021 as this is most likely viral pneumonia.  Blood cultures negative so far. ?-Switch Solu-Medrol to 40 mg prednisone daily ?-Currently on room air ? ?Poor oral intake ?-Oral intake is poor.  Encourage oral intake.  Start maintenance IV fluids normal saline at 75 cc an hour. ?-Palliative care consultation for goals of care discussion. ? ?Type II diabetes mellitus with renal manifestations ([place]) ?- Continue CBGs with SSI ? ?CKD (chronic kidney disease), stage III ([place]) ?CKD stage IIIb ?-Creatinine at baseline.  Monitor ? ?Paroxysmal atrial fibrillation (HCC) ?- Continue Cardizem and metoprolol. ? ?Benign essential HTN ?- Continue Cardizem and metoprolol. ? ?Parkinson's disease ([place]) ?- Continue home Sinemet.   ?-PT recommends SNF.  Social worker consulted. ? ?Delirium ?Possible dementia ?-Patient had agitation on 03/22/2021.  Use Haldol and Ativan as needed. ? ?Coronary artery disease involving native coronary artery of native heart  without angina pectoris ?Hyperlipidemia ?-Continue Plavix, metoprolol.  Outpatient follow-up with cardiology.  No chest pain. ? ?History of CVA (cerebrovascular accident) ?- Continue Plavix ? ?Hypothyroidism ?- Continue Synthroid ? ?Anemia of chronic disease ?- From chronic illnesses.  Hemoglobin stable.  Monitor intermittently. ? ?Exudative age-related macular degeneration ([place]) ?- Continue home eyedrops ? ? ? ?  ? ?Subjective:  ?Patient seen and examined at bedside.  Poor historian.  Still slightly confused.  Nursing staff reports episodes of agitation yesterday along with very poor oral intake.  No overnight seizures, fever or vomiting reported. ?Physical Exam: ?Vitals:  ? 03/22/21 1339 03/22/21 2113 03/23/21 0344 03/23/21 0415  ?BP: 138/70 (!) 145/72  (!) 154/100  ?Pulse: 65 60  64  ?Resp: '16 16  16  '$ ?Temp: 98.1 ?F (36.7 ?C) 98 ?F (36.7 ?C)  97.7 ?F (36.5 ?C)  ?TempSrc: Oral Oral  Oral  ?SpO2:  93%  100%  ?Weight:   66.7 kg   ?Height:      ? ?General: No acute distress, currently on room air.  Chronically ill and deconditioned. ?ENT/neck: No elevated JVD.  No obvious masses  ?respiratory: Bilateral decreased breath sounds at bases with some scattered crackles, no wheezing ?CVS: Currently rate controlled; S1-S2 heard  ?abdominal: Soft, nontender, distended mildly; no organomegaly, bowel sounds heard ?Extremities: No cyanosis, clubbing; trace lower extremity edema present  ?CNS: Awake.  Extremely slow to respond.  Very poor historian.  Confused.  No focal neurologic deficit.  Moving extremities. ?Lymph: No cervical lymphadenopathy ?Skin: No rashes, lesions, ulcers ?Psych: Extremely flat affect.  No signs of agitation currently.   ?Musculoskeletal: No obvious joint deformity/tenderness/swelling ? ? ?Data Reviewed: ?I have myself reviewed patient's investigations including imaging and blood work during this  hospitalization.  Today sodium is 135, potassium 4.2, creatinine 2.1.  Blood cultures have remained negative  so far. ? ?Family Communication: None at bedside ? ?Disposition: ?Status is: Inpatient ?Remains inpatient appropriate because: Need for SNF placement.  ? Planned Discharge Destination: Skilled nursing facility ? ? ? ? ?Author: ?Aline August, MD ?03/23/2021 7:42 AM ? ?For on call review [email or web address].  ?

## 2021-03-23 NOTE — TOC Initial Note (Addendum)
Transition of Care (TOC) - Initial/Assessment Note  ? ? ?Patient Details  ?Name: [name] ?MRN: [id number] ?Date of Birth: [birth year] ? ?Transition of Care (TOC) CM/SW Contact:    ?Ross Ludwig, LCSW ?Phone Number: ?03/23/2021, 11:44 AM ? ?Clinical Narrative:                 ? ?Patient is an 88 year old male who is alert and oriented x1.  Patient is from [facility], and has dementia.  Per patient's daughter, he has not been to SNF before for reahab.  CSW explained process and how insurance will pay for stay at SNF.  CSW was given permission to begin bed search in [facility].  CSW spoke to patient's daughter Amy to discuss PT recommendation for SNF placement. ? ?3:15pm CSW spoke to [place] at [place], she has spoke to [employer] the [employer] at [employer] and per Harrie Jeans feels comfortable having patient return back to ALF with home health services.  CSW spoke to patient's daughter Warren Lacy [phone] and explained to her what this CSW was told, and she agrees that patient would do better back at the ALF which is a familiar environment for him.  Carmelia Roller DON at [facility] will follow up with [facility] worker tomorrow and confirm patient can return once medically ready for discharge. ? ?Expected Discharge Plan: [place] ?Barriers to Discharge: Continued Medical Work up ? ? ?Patient Goals and CMS Choice ?Patient states their goals for this hospitalization and ongoing recovery are:: To go to SNF then return back to [facility] ?CMS Medicare.gov Compare Post Acute Care list provided to:: Patient Represenative (must comment) ?Choice offered to / list presented to : Adult Children ? ?Expected Discharge Plan and Services ?Expected Discharge Plan: [place] ?  ?  ?  ?  ?                ?  ?  ?  ?  ?  ?  ?  ?  ?  ?  ? ?Prior Living Arrangements/Services ?  ?  ?Patient language and need for interpreter reviewed:: Yes ?Do you feel safe going back to the place where you live?: No    Patient's family are agreeable to patient going to SNF for short term rehab first before returning back to [facility].  ?Need for Family Participation in Patient Care: Yes (Comment) ?Care giver support system in place?: No (comment) ?  ?Criminal Activity/Legal Involvement Pertinent to Current Situation/Hospitalization: No - Comment as needed ? ?Activities of Daily Living ?  ?  ? ?Permission Sought/Granted ?Permission sought to share information with : Case Manager, Family Supports ?Permission granted to share information with : Yes, Release of Information Signed ? Share Information with NAME: Lucita Ferrara Daughter [phone]    Heath,Frankie Sister [phone]    [name] Relative [phone]    Heath,Frankie Sister   [phone] ? Permission granted to share info w AGENCY: SNF admissions ?   ?   ? ?Emotional Assessment ?Appearance:: Appears stated age ?Attitude/Demeanor/Rapport: Other (comment) (Minor agitation) ?Affect (typically observed): Accepting, Appropriate, Agitated ?Orientation: : Oriented to Self ?Alcohol / Substance Use: Not Applicable ?Psych Involvement: No (comment) ? ?Admission diagnosis:  Pneumonia [J18.9] ?Healthcare-associated pneumonia [J18.9] ?Left hip pain [M25.552] ?Flank pain [R10.9] ?Severe comorbid illness [R69] ?Patient Active Problem List  ? Diagnosis Date Noted  ? Delirium 03/23/2021  ? Dementia ([place]) 03/23/2021  ? Hypothyroidism 03/21/2021  ? Pneumonia 03/20/2021  ? Secondary hypercoagulable  state ([place]) 08/10/2019  ? Anemia of chronic disease 11/13/2018  ? Parkinson's disease ([place]) 11/13/2018  ? Paroxysmal atrial fibrillation (HCC)   ? Coronary artery disease involving native coronary artery of native heart without angina pectoris   ? Hyperlipidemia LDL goal <70   ? Urinary tract infection with hematuria   ? Non-ST elevation (NSTEMI) myocardial infarction [facility])   ? GERD (gastroesophageal reflux disease) 08/04/2017  ? Elevated troponin 08/03/2017  ? Chest pain 08/03/2017  ?  CKD (chronic kidney disease), stage III ([place]) 08/03/2017  ? Type II diabetes mellitus with renal manifestations ([place]) 08/03/2017  ? Pseudophakia of both eyes 05/31/2017  ? Retained ureteral stent 04/22/2016  ? Ureteral calculus 01/25/2016  ? Benign essential HTN 10/24/2015  ? History of CVA (cerebrovascular accident) 10/14/2014  ? TIA (transient ischemic attack)   ? CME (cystoid macular edema), left 08/10/2013  ? Squamous cell carcinoma of eyelid 05/15/2013  ? Blepharitis 03/16/2013  ? Posterior vitreous detachment of right eye 10/23/2011  ? Exudative age-related macular degeneration ([place]) 07/28/2011  ? Nonproliferative diabetic retinopathy ([place]) 04/07/2011  ? Insomnia, unspecified 05/19/2010  ? ATAXIA 03/17/2010  ? DIZZINESS 03/06/2010  ? Chronic constipation 02/21/2008  ? OSTEOARTHRITIS, KNEE, SEVERE 01/12/2008  ? URTICARIA 04/22/2006  ? POPLITEAL CYST 04/22/2006  ? THYROIDECTOMY, SUBTOTAL, HX OF 04/22/2006  ? TOTAL KNEE REPLACEMENT, HX OF 04/22/2006  ? ?PCP:  Patient, No Pcp Per (Inactive) ?Pharmacy:   ?[facility] [address] ?[address] ?[place] [address] ?Phone: [phone] Fax: [phone] ? ? ? ? ?Social Determinants of Health (SDOH) Interventions ?  ? ?Readmission Risk Interventions ?No flowsheet data found. ? ? ?

## 2021-03-24 DIAGNOSIS — D638 Anemia in other chronic diseases classified elsewhere: Secondary | ICD-10-CM | POA: Diagnosis not present

## 2021-03-24 DIAGNOSIS — J189 Pneumonia, unspecified organism: Secondary | ICD-10-CM | POA: Diagnosis not present

## 2021-03-24 DIAGNOSIS — N1832 Chronic kidney disease, stage 3b: Secondary | ICD-10-CM | POA: Diagnosis not present

## 2021-03-24 DIAGNOSIS — R410 Disorientation, unspecified: Secondary | ICD-10-CM

## 2021-03-24 DIAGNOSIS — F03918 Unspecified dementia, unspecified severity, with other behavioral disturbance: Secondary | ICD-10-CM

## 2021-03-24 DIAGNOSIS — I10 Essential (primary) hypertension: Secondary | ICD-10-CM | POA: Diagnosis not present

## 2021-03-24 LAB — BASIC METABOLIC PANEL
Anion gap: 9 (ref 5–15)
BUN: 61 mg/dL — ABNORMAL HIGH (ref 8–23)
CO2: 23 mmol/L (ref 22–32)
Calcium: 8.4 mg/dL — ABNORMAL LOW (ref 8.9–10.3)
Chloride: 104 mmol/L (ref 98–111)
Creatinine, Ser: 2.09 mg/dL — ABNORMAL HIGH (ref 0.61–1.24)
GFR, Estimated: 30 mL/min — ABNORMAL LOW (ref >60)
Glucose, Bld: 196 mg/dL — ABNORMAL HIGH (ref 70–99)
Potassium: 3.9 mmol/L (ref 3.5–5.1)
Sodium: 136 mmol/L (ref 135–145)

## 2021-03-24 LAB — MAGNESIUM: Magnesium: 2.3 mg/dL (ref 1.7–2.4)

## 2021-03-24 LAB — GLUCOSE, CAPILLARY
Glucose-Capillary: 202 mg/dL — ABNORMAL HIGH (ref 70–99)
Glucose-Capillary: 143 mg/dL — ABNORMAL HIGH (ref 70–99)
Glucose-Capillary: 163 mg/dL — ABNORMAL HIGH (ref 70–99)

## 2021-03-24 MED ORDER — PREDNISONE 20 MG PO TABS
40.0000 mg | ORAL_TABLET | Freq: Every day | ORAL | 0 refills | Status: DC
Start: 2021-03-24 — End: 2021-04-04

## 2021-03-24 NOTE — TOC Transition Note (Signed)
Transition of Care (TOC) - CM/SW Discharge Note ? ? ?Patient Details  ?Name: [name] ?MRN: [id number] ?Date of Birth: 1931/09/18 ? ?Transition of Care (TOC) CM/SW Contact:  ?[place], LCSW ?Phone Number: ?03/24/2021, 11:24 AM ? ? ?Clinical Narrative:    ? ?HH is arranged with [facility](orders and face to face faxed to ALF) and outpatient palliative has been arranged with [facility].  ? ?Patient will DC to: [facility] ?Anticipated DC date: 03/24/21 ?Family notified: Daughter Amy ?Transport by: Corey Harold ? ? ?Per MD patient ready for DC to [facility]. RN, patient, patient's family, and facility notified of DC. Discharge Summary and FL2 sent to facility. RN to call report prior to discharge [phone] and ask for supervisor). DC packet on chart. Ambulance transport requested for patient.  ? ?CSW will sign off for now as social work intervention is no longer needed. Please consult [place] again if new needs arise.  ? ? ?Final next level of care: Assisted Living ?Barriers to Discharge: No Barriers Identified ? ? ?Patient Goals and CMS Choice ?Patient states their goals for this hospitalization and ongoing recovery are:: To go to SNF then return back to [facility] ?CMS Medicare.gov Compare Post Acute Care list provided to:: Patient Represenative (must comment) ?Choice offered to / list presented to : Adult Children ? ?Discharge Placement ?  ?           ?Patient chooses bed at:  [facility]) ?Patient to be transferred to facility by: [facility] ?Name of family member notified: Daughter Amy ?Patient and family notified of of transfer: 03/24/21 ? ?Discharge Plan and Services ?  ?  ?           ?  ?  ?  ?  ?  ?  ?  ?  ?  ?  ? ?Social Determinants of Health (SDOH) Interventions ?  ? ? ?Readmission Risk Interventions ?No flowsheet data found. ? ? ? ? ?

## 2021-03-24 NOTE — TOC Progression Note (Signed)
Transition of Care (TOC) - Progression Note  ? ? ?Patient Details  ?Name: [name] ?MRN: [id number] ?Date of Birth: Nov 18, 1931 ? ?Transition of Care (TOC) CM/SW Contact  ?[place], LCSW ?Phone Number: ?03/24/2021, 9:15 AM ? ?Clinical Narrative:    ? ?CSW called Barnett Applebaum with [facility]. Confirmed pt can return today. Fl2 and DC summary to be faxed to [phone]. RN can call main # and as for supervisor in charge to provide report. Pt will need [facility] transport.  ? ?Expected Discharge Plan: [place] ?Barriers to Discharge: Continued Medical Work up ? ?Expected Discharge Plan and Services ?Expected Discharge Plan: [place] ?  ?  ?  ?  ?Expected Discharge Date: 03/24/21               ?  ?  ?  ?  ?  ?  ?  ?  ?  ?  ? ? ?Social Determinants of Health (SDOH) Interventions ?  ? ?Readmission Risk Interventions ?No flowsheet data found. ? ?

## 2021-03-24 NOTE — Progress Notes (Signed)
Physical Therapy Treatment ?Patient Details ?Name: [name] ?MRN: [id number] ?DOB: [birth year] ?Today's Date: 03/24/2021 ? ? ?History of Present Illness 88 y.o. male with medical history significant of diabetes, hypothyroidism, anemia, atrial fibrillation, CAD, hyperlipidemia, GERD, CVA, CKD 3, hypertension, Parkinson disease, ataxia presenting with flank pain, admitted with pna ? ?  ?PT Comments  ? ? Pt progressing slowly. Requires incr time and mutli-modal cues for all tasks. very sleepy initially needing incr stimulation and then encouragement  to participate. Progression to OOB to chair with PT today. Continue to recommend SNF as pt is overall mod/max assist for basic mobility ?  ?Recommendations for follow up therapy are one component of a multi-disciplinary discharge planning process, led by the attending physician.  Recommendations may be updated based on patient status, additional functional criteria and insurance authorization. ? ?Follow Up Recommendations ? Skilled nursing-short term rehab (<3 hours/day) ?  ?  ?Assistance Recommended at Discharge Frequent or constant Supervision/Assistance  ?Patient can return home with the following A lot of help with bathing/dressing/bathroom;Assistance with cooking/housework;Direct supervision/assist for financial management;A little help with walking and/or transfers;Direct supervision/assist for medications management;Help with stairs or ramp for entrance;Assist for transportation;Assistance with feeding ?  ?Equipment Recommendations ? None recommended by PT  ?  ?Recommendations for Other Services   ? ? ?  ?Precautions / Restrictions Precautions ?Precautions: Fall ?Restrictions ?Weight Bearing Restrictions: No  ?  ? ?Mobility ? Bed Mobility ?Overal bed mobility: Needs Assistance ?Bed Mobility: Rolling, Sidelying to Sit ?Rolling: Mod assist ?Sidelying to sit: Max assist ?  ?  ?  ?General bed mobility comments: assist and hand over hand to perform roll to pt L side,  assist to bring LEs off bed and elevate trunk. incr time for bil LE flexion, repetition, rocking to alllow incr pt effort ?  ? ?Transfers ?Overall transfer level: Needs assistance ?Equipment used:  (chair back) ?Transfers: Sit to/from Stand, Bed to chair/wheelchair/BSC ?Sit to Stand: From elevated surface, Max assist, Mod assist ?Stand pivot transfers: Max assist, Mod assist ?  ?  ?  ?  ?General transfer comment: STS x2  with bil  hands on back of chair with mod-max assist cues/assist for incr knee flexion and anterior wt shift on 3rd (3/4 STS) able to pivot to chair with assist to rise and turn, pt able top reach for L chair arm and demonstrate incr anterior wt shift in prep for transition ?  ? ?Ambulation/Gait ?  ?  ?  ?  ?  ?  ?  ?  ? ? ?Stairs ?  ?  ?  ?  ?  ? ? ?Wheelchair Mobility ?  ? ?Modified Rankin (Stroke Patients Only) ?  ? ? ?  ?Balance Overall balance assessment: Needs assistance ?Sitting-balance support: Single extremity supported, Feet supported, No upper extremity supported, Feet unsupported ?Sitting balance-Leahy Scale: Poor ?Sitting balance - Comments: poor to fair, pt with initial posterior bias, with multi-moal cues pt able to maintain midline and keep bil feet on floor, close supervision ?Postural control: Posterior lean, Left lateral lean ?Standing balance support: During functional activity, Bilateral upper extremity supported ?Standing balance-Leahy Scale: Poor ?Standing balance comment: reliant on external support to maintain static standing ?  ?  ?  ?  ?  ?  ?  ?  ?  ?  ?  ?  ? ?  ?Cognition Arousal/Alertness: Awake/alert (difficult to arouse initially) ?Behavior During Therapy: [facility] for tasks assessed/performed ?Overall Cognitive Status: No family/caregiver present to determine baseline cognitive functioning ?  ?  ?  ?  ?  ?  ?  ?  ?  ?  ?  ?  ?  ?  ?  ?  ?  ?  ?  ? ?  ?  Exercises Other Exercises ?Other Exercises: trunk extension adn flexion, verbal and tactile cues x5 ?Other Exercises:  trunk rotation x 5 bil ?Other Exercises: gentle cervical AAROM ? ?  ?General Comments   ?  ?  ? ?Pertinent Vitals/Pain Pain Assessment ?Pain Assessment: Faces ?Faces Pain Scale: Hurts a little bit ?Pain Location: neck ?Pain Descriptors / Indicators: Discomfort, Sore ?Pain Intervention(s): Limited activity within patient's tolerance, Monitored during session, Repositioned  ? ? ?Home Living   ?  ?  ?  ?  ?  ?  ?  ?  ?  ?   ?  ?Prior Function    ?  ?  ?   ? ?PT Goals (current goals can now be found in the care plan section) Acute Rehab PT Goals ?PT Goal Formulation: Patient unable to participate in goal setting ?Time For Goal Achievement: 04/04/21 ?Potential to Achieve Goals: Fair ?Progress towards PT goals: Progressing toward goals ? ?  ?Frequency ? ? ? Min 2X/week ? ? ? ?  ?PT Plan Current plan remains appropriate  ? ? ?Co-evaluation   ?  ?  ?  ?  ? ?  ?AM-PAC PT "6 Clicks" Mobility   ?Outcome Measure ? Help needed turning from your back to your side while in a flat bed without using bedrails?: A Lot ?Help needed moving from lying on your back to sitting on the side of a flat bed without using bedrails?: A Lot ?Help needed moving to and from a bed to a chair (including a wheelchair)?: A Lot ?Help needed standing up from a chair using your arms (e.g., wheelchair or bedside chair)?: A Lot ?Help needed to walk in hospital room?: Total ?  ?6 Click Score: 9 ? ?  ?End of Session Equipment Utilized During Treatment: Gait belt ?Activity Tolerance: Patient tolerated treatment well;Patient limited by fatigue ?Patient left: in chair;with call bell/phone within reach;with chair alarm set ?Nurse Communication: Mobility status ?PT Visit Diagnosis: Other abnormalities of gait and mobility (R26.89);Muscle weakness (generalized) (M62.81);Other symptoms and signs involving the nervous system (R29.898) ?  ? ? ?Time: 3953-2023 ?PT Time Calculation (min) (ACUTE ONLY): 17 min ? ?Charges:  $Therapeutic Activity: 8-22 mins          ?           ? Baxter Flattery, PT ? ?Acute Rehab Dept [facility]) [phone] ?Pager [phone] ? ?03/24/2021 ? ? ? ?[name] ?03/24/2021, 11:13 AM ? ?

## 2021-03-24 NOTE — Progress Notes (Signed)
[facility]) Hospital Liaison note: ? ?Notified via workque by Dr Aline August of request for [place] services. Cyrus Kolar, TOC notified. Will continue to follow for disposition. ? ?Please call with any outpatient palliative questions or concerns. ? ?Thank you for the opportunity to participate in this patient's care. ? ?Thank you, ?Lorelee Market, LPN ?[facility] Hospital Liaison ?[phone] ?

## 2021-03-24 NOTE — Discharge Summary (Signed)
Physician Discharge Summary  WETZEL MEESTER WER:[id number] DOB: [birth year] DOA: 03/20/2021  PCP: Patient, No Pcp Per (Inactive)  Admit date: 03/20/2021 Discharge date: 03/24/2021  Admitted From: ALF Disposition: ALF  Recommendations for Outpatient Follow-up:  Follow up with PCP in 1 week with repeat CBC/BMP Recommend Palliative care evaluation and follow-up for goals of care discussion Follow up in ED if symptoms worsen or new appear   Home Health: HHPT/OT Equipment/Devices: None  Discharge Condition: Guarded CODE STATUS: DNR Diet recommendation: Heart healthy  Brief/Interim Summary: 88 y.o. male with medical history significant of diabetes, hypothyroidism, anemia, atrial fibrillation, CAD, hyperlipidemia, GERD, CVA, CKD 3, hypertension, Parkinson disease, ataxia presented with flank pain.  Work-up revealed bibasilar opacities right greater than left suspicious for pneumonia.  CT of the chest showed multifocal pneumonia with chronic descending aortic dissection.  He was started on IV antibiotics.  Subsequently, respiratory virus panel was positive for rhinovirus.  IV antibiotics were discontinued and he was started on Solu-Medrol.  PT recommended SNF placement. Family decided that patient will return back to ALF. His condition has improved; respiratory status is currently stable; on room air. He will be discharged back to ALF today on oral prednisone.  Discharge Diagnoses:  * Pneumonia - Found to have multifocal pneumonia on imaging.  COVID-19 and influenza testing negative -Respiratory virus panel positive for rhinovirus. Procalcitonin less than 0.1.  DC'd antibiotics on 03/21/2021 as this is most likely viral pneumonia.  Blood cultures negative so far. -Switched Solu-Medrol to 40 mg prednisone daily on 03/23/21.  -Currently on room air -He will be discharged back to ALF today on oral prednisone '40mg'$  daily for 5 days.   Poor oral intake -Oral intake is poor.  Encourage oral intake.  Treated with maintenance IV fluids normal saline at 75 cc an hour. -Palliative care consultation for goals of care discussion is pending. This can happen as an outpatient.   Type II diabetes mellitus with renal manifestations (HCC) -Diet controlled. Outpatient follow up   CKD (chronic kidney disease), stage III (HCC) CKD stage IIIb -Creatinine at baseline.  out patient follow up   Paroxysmal atrial fibrillation (HCC) - Continue Cardizem and metoprolol.   Benign essential HTN - Continue Cardizem and metoprolol.   Parkinson's disease ([place]) - Continue home Sinemet.   -PT recommended SNF.  Family decided that patient will return back to ALF.    Delirium Possible dementia -Patient had agitation on 03/22/2021 requiring haldol. Since then, he has remained stable with no need for further doses of haldol. -outpatient follow up   Coronary artery disease involving native coronary artery of native heart without angina pectoris Hyperlipidemia -Continue Plavix, metoprolol.  Outpatient follow-up with cardiology.  No chest pain.   History of CVA (cerebrovascular accident) - Continue Plavix   Hypothyroidism - Continue Synthroid   Anemia of chronic disease - From chronic illnesses.  Hemoglobin stable.  Monitor intermittently.   Exudative age-related macular degeneration (HCC) - Continue home eyedrops     Discharge Instructions  Discharge Instructions     Amb Referral to Palliative Care   Complete by: As directed    Diet - low sodium heart healthy   Complete by: As directed    Increase activity slowly   Complete by: As directed       Allergies as of 03/24/2021       Reactions   Colchicine Anaphylaxis   Hibiclens [chlorhexidine] Hives   Atorvastatin Other (See Comments)   Joint pain    Celebrex [celecoxib] Swelling  Codeine Hives   Erythromycin Diarrhea   Ezetimibe Other (See Comments)   Joint pain    Iodinated Contrast Media Nausea And Vomiting, Rash    Oxycodone-acetaminophen Hives   Simvastatin Other (See Comments)   Joint pain    Crestor [rosuvastatin] Other (See Comments)   Joint pain   Erythromycin Other (See Comments)   Listed on MAR   Hydroxychloroquine Other (See Comments)   Listed on MAR   Nitroglycerin Other (See Comments)   Listed on MAR   Protonix [pantoprazole] Other (See Comments)   unknown   Tetanus Toxoids Other (See Comments)   Listed on MAR   Cefpodoxime Rash   Cefuroxime Axetil Diarrhea, Rash   Nitroglycerin Other (See Comments)   Lowers patient's B/P   Vantin Other (See Comments)   Unknown        Medication List     STOP taking these medications    brinzolamide 1 % ophthalmic suspension Commonly known as: AZOPT   traMADol 50 MG tablet Commonly known as: ULTRAM       TAKE these medications    alfuzosin 10 MG 24 hr tablet Commonly known as: UROXATRAL Take 1 tablet (10 mg total) by mouth at bedtime. What changed: when to take this   amiodarone 200 MG tablet Commonly known as: Pacerone Take 1 tablet (200 mg total) by mouth daily.   apixaban 2.5 MG Tabs tablet Commonly known as: ELIQUIS Take 2.5 mg by mouth 2 (two) times daily.   ARTIFICIAL TEARS OP Place 1 drop into both eyes 4 (four) times daily.   brimonidine-timolol 0.2-0.5 % ophthalmic solution Commonly known as: COMBIGAN Place 1 drop into both eyes 2 (two) times daily.   carbidopa-levodopa 25-100 MG tablet Commonly known as: SINEMET IR Take 1 tablet by mouth 3 (three) times daily. 0800, 1400, and 2000   clopidogrel 75 MG tablet Commonly known as: PLAVIX Take 75 mg by mouth daily.   Dextromethorphan-guaiFENesin 10-100 MG/5ML liquid Take 5 mLs by mouth in the morning, at noon, and at bedtime. For 10 days   diltiazem 120 MG 24 hr capsule Commonly known as: CARDIZEM CD Take 1 capsule (120 mg total) by mouth daily for 30 days.   docusate sodium 100 MG capsule Commonly known as: COLACE Take 100 mg by mouth 2 (two) times  daily.   ferrous sulfate 325 (65 FE) MG EC tablet Take 1 tablet (325 mg total) by mouth 2 (two) times daily with a meal.   fluticasone 50 MCG/ACT nasal spray Commonly known as: FLONASE Place 2 sprays into both nostrils daily.   furosemide 20 MG tablet Commonly known as: LASIX Take 20 mg by mouth daily.   INSTA-GLUCOSE PO Take 1 Tube by mouth as needed (hypoglycemia or BS <60).   latanoprost 0.005 % ophthalmic solution Commonly known as: XALATAN Place 1 drop into the right eye at bedtime.   levothyroxine 150 MCG tablet Commonly known as: SYNTHROID Take 150 mcg by mouth daily.   loratadine 10 MG tablet Commonly known as: CLARITIN Take 10 mg by mouth at bedtime.   Melatonin 3 MG Subl Place 3 mg under the tongue at bedtime.   metoprolol succinate 25 MG 24 hr tablet Commonly known as: TOPROL-XL Take 1 tablet (25 mg total) by mouth daily.   mirabegron ER 50 MG Tb24 tablet Commonly known as: MYRBETRIQ Take 1 tablet (50 mg total) by mouth daily.   pantoprazole 40 MG tablet Commonly known as: PROTONIX Take 40 mg by mouth 2 (two) times  daily.   polyethylene glycol 17 g packet Commonly known as: MIRALAX / GLYCOLAX Take 17 g by mouth daily.   Potassium Chloride ER 20 MEQ Tbcr Take 20 mEq by mouth daily.   predniSONE 20 MG tablet Commonly known as: DELTASONE Take 2 tablets (40 mg total) by mouth daily with breakfast for 5 days.   PreserVision/Lutein Caps Take 1 capsule by mouth 2 (two) times daily.   sertraline 25 MG tablet Commonly known as: ZOLOFT Take 25 mg by mouth daily.   Uro-MP 118 MG Caps Take 1 capsule by mouth 3 (three) times daily.   Vitamin D-3 125 MCG (5000 UT) Tabs Take 5,000 Units by mouth daily.        Allergies  Allergen Reactions   Colchicine Anaphylaxis   Hibiclens [Chlorhexidine] Hives   Atorvastatin Other (See Comments)    Joint pain    Celebrex [Celecoxib] Swelling   Codeine Hives   Erythromycin Diarrhea   Ezetimibe Other (See  Comments)    Joint pain    Iodinated Contrast Media Nausea And Vomiting and Rash   Oxycodone-Acetaminophen Hives   Simvastatin Other (See Comments)    Joint pain    Crestor [Rosuvastatin] Other (See Comments)    Joint pain   Erythromycin Other (See Comments)    Listed on MAR   Hydroxychloroquine Other (See Comments)    Listed on MAR   Nitroglycerin Other (See Comments)    Listed on MAR   Protonix [Pantoprazole] Other (See Comments)    unknown   Tetanus Toxoids Other (See Comments)    Listed on MAR   Cefpodoxime Rash   Cefuroxime Axetil Diarrhea and Rash   Nitroglycerin Other (See Comments)    Lowers patient's B/P   Vantin Other (See Comments)    Unknown    Consultations: Palliative care consultation is pending   Procedures/Studies: DG Chest 1 View  Result Date: 03/20/2021 CLINICAL DATA:  CT of flank and back pain. EXAM: CHEST  1 VIEW COMPARISON:  07/05/2020 FINDINGS: Upper normal heart size. Retrocardiac hiatal hernia. Airspace opacity at the right lung base. There also patchy opacities at the left lung base this is superimposed on chronic interstitial changes in the periphery of the left lung base. Limited assessment for pleural effusion. No pneumothorax. The bones are diffusely under mineralized. The left seventh posterior rib is poorly visualized which is nonspecific. IMPRESSION: 1. Bibasilar airspace opacities, right greater than left, suspicious for pneumonia, including aspiration. 2. Background left lung base scarring. 3. Retrocardiac hiatal hernia. 4. The left posterior seventh rib is poorly visualized, nonspecific. This is likely due to overlapping structures, although the possibility of rib destruction is difficult to exclude in the setting of left flank pain. Electronically Signed   By: Keith Rake M.D.   On: 03/20/2021 18:11   CT Chest Wo Contrast  Result Date: 03/20/2021 CLINICAL DATA:  Left-sided chest pain cough EXAM: CT CHEST WITHOUT CONTRAST TECHNIQUE:  Multidetector CT imaging of the chest was performed following the standard protocol without IV contrast. RADIATION DOSE REDUCTION: This exam was performed according to the departmental dose-optimization program which includes automated exposure control, adjustment of the mA and/or kV according to patient size and/or use of iterative reconstruction technique. COMPARISON:  Chest x-ray from earlier in the same day. FINDINGS: Cardiovascular: Somewhat limited due to lack of IV contrast. Calcifications of the thoracic aorta are noted. Findings of prior chronic dissection are seen the proximal descending thoracic aorta. No aneurysmal dilatation is noted. Coronary calcifications are seen. No  cardiac enlargement is noted. Mediastinum/Nodes: Thoracic inlet is within normal limits. No sizable hilar or mediastinal adenopathy is seen. The esophagus as visualized is within normal limits with the exception of a moderate-sized hiatal hernia. Lungs/Pleura: Lungs are well aerated bilaterally. Patchy airspace opacities are identified in both lungs worst in the right lower lobe and right middle lobe consistent with multifocal pneumonia. No sizable effusion is seen. No sizable parenchymal nodules are noted. Upper Abdomen: Visualized upper abdomen shows evidence of cholelithiasis without complicating factors. Renal cystic change is seen. Musculoskeletal: Degenerative changes of the thoracic spine are noted. No rib abnormality is seen. IMPRESSION: Changes consistent with multifocal pneumonia worst in the right middle lobe and right lower lobe Cholelithiasis without complicating factors. Changes consistent with chronic dissection in the descending thoracic aorta. Aortic Atherosclerosis (ICD10-I70.0). Electronically Signed   By: Inez Catalina M.D.   On: 03/20/2021 20:25   CT RENAL STONE STUDY  Result Date: 03/20/2021 CLINICAL DATA:  Left-sided flank pain EXAM: CT ABDOMEN AND PELVIS WITHOUT CONTRAST TECHNIQUE: Multidetector CT imaging of  the abdomen and pelvis was performed following the standard protocol without IV contrast. RADIATION DOSE REDUCTION: This exam was performed according to the departmental dose-optimization program which includes automated exposure control, adjustment of the mA and/or kV according to patient size and/or use of iterative reconstruction technique. COMPARISON:  12/01/2018 FINDINGS: Lower chest: Known multifocal pneumonia is again identified. Hepatobiliary: Cholelithiasis is seen. The liver is within normal limits. Pancreas: Cystic changes are again seen in the head of the pancreas stable from the prior exam. The largest of these measures 17 mm in greatest dimension. Spleen: Normal in size without focal abnormality. Adrenals/Urinary Tract: Adrenal glands are within normal limits. Multiple cysts are identified throughout both kidneys. The dominant cyst in the lower pole of the left kidney on the prior study has reduced in size. A dominant cyst on the right anteriorly measuring up to 8.9 cm is again noted and stable. Stable nonobstructing lower pole renal calculi are noted on the left measuring up to 11 mm. No right renal calculi are seen. The ureters are within normal limits. Bladder is well distended. Stomach/Bowel: No obstructive or inflammatory changes of the colon are seen. The appendix is not well visualized although no inflammatory changes are seen. Large hiatal hernia is noted. The small bowel is unremarkable. Vascular/Lymphatic: Aortic atherosclerosis. No enlarged abdominal or pelvic lymph nodes. Reproductive: Prostate is unremarkable. Other: No abdominal wall hernia or abnormality. No abdominopelvic ascites. Musculoskeletal: Degenerative changes of lumbar spine are noted. Chronic L1 compression deformity is noted. IMPRESSION: Multifocal pneumonia similar to that seen on recent chest CT. Cholelithiasis without complicating factors. Cystic changes in the kidneys and pancreas stable from the prior exam with the  exception of reduction in size in a lower pole left renal cyst. Nonobstructing renal calculi in the lower pole of the left kidney measuring up to 11 mm. No obstructive changes are seen. Aortic Atherosclerosis (ICD10-I70.0). Electronically Signed   By: Inez Catalina M.D.   On: 03/20/2021 20:29   DG Hip Unilat With Pelvis 2-3 Views Left  Result Date: 03/20/2021 CLINICAL DATA:  Flank pain. EXAM: DG HIP (WITH OR WITHOUT PELVIS) 2-3V LEFT COMPARISON:  Left hip CT 08/13/2017 FINDINGS: The bones are under mineralized which limits assessment. No acute fracture. Mild left hip joint space narrowing and spurring. The pubic rami are grossly intact. No erosion, avascular necrosis, or bony destruction. Prior left inguinal hernia repair. IMPRESSION: Chronic osteoarthritis without acute osseous abnormality. Osteopenia/osteoporosis limits assessment.  Electronically Signed   By: Keith Rake M.D.   On: 03/20/2021 18:09      Subjective: Patient seen and examined at bedside. Poor historian; slightly confused. No agitation reported. No overnight fever, seizures, vomiting reported. Oral intake is still not that good.  Discharge Exam: Vitals:   03/23/21 2023 03/24/21 0234  BP: 121/75 (!) 158/85  Pulse: 63 71  Resp: 20 16  Temp: 98 F (36.7 C) 97.7 F (36.5 C)  SpO2: 93% 93%    General: No distress. On room air. Poor historian; slightly confused Cardiovascular: rate controlled, S1/S2 + Respiratory: bilateral decreased breath sounds at bases Abdominal: Soft, NT, ND, bowel sounds + Extremities: trace lower extremity edema; no cyanosis    The results of significant diagnostics from this hospitalization (including imaging, microbiology, ancillary and laboratory) are listed below for reference.     Microbiology: Recent Results (from the past 240 hour(s))  Resp Panel by RT-PCR (Flu A&B, Covid) Nasopharyngeal Swab     Status: None   Collection Time: 03/20/21  8:06 PM   Specimen: Nasopharyngeal Swab;  Nasopharyngeal(NP) swabs in vial transport medium  Result Value Ref Range Status   SARS Coronavirus 2 by RT PCR NEGATIVE NEGATIVE Final    Comment: (NOTE) SARS-CoV-2 target nucleic acids are NOT DETECTED.  The SARS-CoV-2 RNA is generally detectable in upper respiratory specimens during the acute phase of infection. The lowest concentration of SARS-CoV-2 viral copies this assay can detect is 138 copies/mL. A negative result does not preclude SARS-Cov-2 infection and should not be used as the sole basis for treatment or other patient management decisions. A negative result may occur with  improper specimen collection/handling, submission of specimen other than nasopharyngeal swab, presence of viral mutation(s) within the areas targeted by this assay, and inadequate number of viral copies(<138 copies/mL). A negative result must be combined with clinical observations, patient history, and epidemiological information. The expected result is Negative.  Fact Sheet for Patients:  EntrepreneurPulse.com.au  Fact Sheet for Healthcare Providers:  IncredibleEmployment.be  This test is no t yet approved or cleared by the [place] FDA and  has been authorized for detection and/or diagnosis of SARS-CoV-2 by FDA under an Emergency Use Authorization (EUA). This EUA will remain  in effect (meaning this test can be used) for the duration of the COVID-19 declaration under Section 564(b)(1) of the Act, 21 U.S.C.section 360bbb-3(b)(1), unless the authorization is terminated  or revoked sooner.       Influenza A by PCR NEGATIVE NEGATIVE Final   Influenza B by PCR NEGATIVE NEGATIVE Final    Comment: (NOTE) The Xpert Xpress SARS-CoV-2/FLU/RSV plus assay is intended as an aid in the diagnosis of influenza from Nasopharyngeal swab specimens and should not be used as a sole basis for treatment. Nasal washings and aspirates are unacceptable for Xpert Xpress  SARS-CoV-2/FLU/RSV testing.  Fact Sheet for Patients: EntrepreneurPulse.com.au  Fact Sheet for Healthcare Providers: IncredibleEmployment.be  This test is not yet approved or cleared by the [place] FDA and has been authorized for detection and/or diagnosis of SARS-CoV-2 by FDA under an Emergency Use Authorization (EUA). This EUA will remain in effect (meaning this test can be used) for the duration of the COVID-19 declaration under Section 564(b)(1) of the Act, 21 U.S.C. section 360bbb-3(b)(1), unless the authorization is terminated or revoked.  Performed at [facility], [place] [address] [place], [place] [address]   Culture, blood (routine x 2)     Status: None (Preliminary result)   Collection  Time: 03/20/21  8:40 PM   Specimen: BLOOD  Result Value Ref Range Status   Specimen Description   Final    BLOOD BLOOD LEFT FOREARM Performed at [facility] [address], [place], [place] [address]    Special Requests   Final    BOTTLES DRAWN AEROBIC AND ANAEROBIC Blood Culture adequate volume Performed at [facility] [address], [place], [place] [address]    Culture   Final    NO GROWTH 4 DAYS Performed at [facility], [place] [address], [place], [place] [address]    Report Status PENDING  Incomplete  Culture, blood (routine x 2)     Status: None (Preliminary result)   Collection Time: 03/20/21  8:45 PM   Specimen: BLOOD  Result Value Ref Range Status   Specimen Description   Final    BLOOD BLOOD RIGHT FOREARM Performed at [facility] [address] [place], [place] [address]    Special Requests   Final    BOTTLES DRAWN AEROBIC AND ANAEROBIC Blood Culture adequate volume Performed at [facility] [address], [place], [place] [address]    Culture   Final    NO GROWTH 4 DAYS Performed at [facility], [place]  [address], [place], [place] [address]    Report Status PENDING  Incomplete  Respiratory (~20 pathogens) panel by PCR     Status: Abnormal   Collection Time: 03/21/21  3:26 AM   Specimen: Nasopharyngeal Swab; Respiratory  Result Value Ref Range Status   Adenovirus NOT DETECTED NOT DETECTED Final   Coronavirus 229E NOT DETECTED NOT DETECTED Final    Comment: (NOTE) The Coronavirus on the Respiratory Panel, DOES NOT test for the novel  Coronavirus (2019 nCoV)    Coronavirus HKU1 NOT DETECTED NOT DETECTED Final   Coronavirus NL63 NOT DETECTED NOT DETECTED Final   Coronavirus OC43 NOT DETECTED NOT DETECTED Final   Metapneumovirus NOT DETECTED NOT DETECTED Final   Rhinovirus / Enterovirus DETECTED (A) NOT DETECTED Final   Influenza A NOT DETECTED NOT DETECTED Final   Influenza B NOT DETECTED NOT DETECTED Final   Parainfluenza Virus 1 NOT DETECTED NOT DETECTED Final   Parainfluenza Virus 2 NOT DETECTED NOT DETECTED Final   Parainfluenza Virus 3 NOT DETECTED NOT DETECTED Final   Parainfluenza Virus 4 NOT DETECTED NOT DETECTED Final   Respiratory Syncytial Virus NOT DETECTED NOT DETECTED Final   Bordetella pertussis NOT DETECTED NOT DETECTED Final   Bordetella Parapertussis NOT DETECTED NOT DETECTED Final   Chlamydophila pneumoniae NOT DETECTED NOT DETECTED Final   Mycoplasma pneumoniae NOT DETECTED NOT DETECTED Final    Comment: Performed at [facility], [address] [place], [place] [address]     Labs: BNP (last 3 results) Recent Labs    03/20/21 1716  BNP 951.8*   Basic Metabolic Panel: Recent Labs  Lab 03/20/21 1716 03/21/21 0501 03/22/21 0615 03/23/21 0533 03/24/21 0410  NA 140 138 136 135 136  K 3.9 3.6 3.8 4.2 3.9  CL 108 105 103 102 104  CO2 '24 24 22 25 23  '$ GLUCOSE 157* 142* 149* 222* 196*  BUN 39* 36* 39* 55* 61*  CREATININE 2.08* 2.07* 1.70* 2.10* 2.09*  CALCIUM 8.3* 8.3* 8.6* 8.7* 8.4*  MG  --   --  2.0 2.4 2.3   Liver Function Tests: Recent Labs  Lab  03/20/21 1716  AST 9*  ALT <5  ALKPHOS 89  BILITOT 0.4  PROT 6.4*  ALBUMIN 3.4*   Recent Labs  Lab 03/20/21 1716  LIPASE 34   No results for input(s): AMMONIA in the last 168 hours. CBC: Recent Labs  Lab 03/20/21 1716 03/21/21 0501  WBC 10.1 10.0  NEUTROABS 7.7  --   HGB 10.2* 10.7*  HCT 32.4* 34.8*  MCV 101.9* 102.1*  PLT 214 209   Cardiac Enzymes: No results for input(s): CKTOTAL, CKMB, CKMBINDEX, TROPONINI in the last 168 hours. BNP: Invalid input(s): POCBNP CBG: Recent Labs  Lab 03/23/21 0710 03/23/21 1117 03/23/21 1659 03/23/21 2021 03/24/21 0730  GLUCAP 207* 221* 202* 143* 163*   D-Dimer No results for input(s): DDIMER in the last 72 hours. Hgb A1c No results for input(s): HGBA1C in the last 72 hours. Lipid Profile No results for input(s): CHOL, HDL, LDLCALC, TRIG, CHOLHDL, LDLDIRECT in the last 72 hours. Thyroid function studies No results for input(s): TSH, T4TOTAL, T3FREE, THYROIDAB in the last 72 hours.  Invalid input(s): FREET3 Anemia work up No results for input(s): VITAMINB12, FOLATE, FERRITIN, TIBC, IRON, RETICCTPCT in the last 72 hours. Urinalysis    Component Value Date/Time   COLORURINE BLUE (A) 03/20/2021 1648   APPEARANCEUR CLEAR 03/20/2021 1648   LABSPEC 1.011 03/20/2021 1648   PHURINE 5.0 03/20/2021 1648   GLUCOSEU NEGATIVE 03/20/2021 1648   HGBUR NEGATIVE 03/20/2021 1648   BILIRUBINUR NEGATIVE 03/20/2021 1648   KETONESUR NEGATIVE 03/20/2021 1648   PROTEINUR NEGATIVE 03/20/2021 1648   UROBILINOGEN 0.2 05/26/2014 2040   NITRITE NEGATIVE 03/20/2021 1648   LEUKOCYTESUR TRACE (A) 03/20/2021 1648   Sepsis Labs Invalid input(s): PROCALCITONIN,  WBC,  LACTICIDVEN Microbiology Recent Results (from the past 240 hour(s))  Resp Panel by RT-PCR (Flu A&B, Covid) Nasopharyngeal Swab     Status: None   Collection Time: 03/20/21  8:06 PM   Specimen: Nasopharyngeal Swab; Nasopharyngeal(NP) swabs in vial transport medium  Result Value Ref  Range Status   SARS Coronavirus 2 by RT PCR NEGATIVE NEGATIVE Final    Comment: (NOTE) SARS-CoV-2 target nucleic acids are NOT DETECTED.  The SARS-CoV-2 RNA is generally detectable in upper respiratory specimens during the acute phase of infection. The lowest concentration of SARS-CoV-2 viral copies this assay can detect is 138 copies/mL. A negative result does not preclude SARS-Cov-2 infection and should not be used as the sole basis for treatment or other patient management decisions. A negative result may occur with  improper specimen collection/handling, submission of specimen other than nasopharyngeal swab, presence of viral mutation(s) within the areas targeted by this assay, and inadequate number of viral copies(<138 copies/mL). A negative result must be combined with clinical observations, patient history, and epidemiological information. The expected result is Negative.  Fact Sheet for Patients:  EntrepreneurPulse.com.au  Fact Sheet for Healthcare Providers:  IncredibleEmployment.be  This test is no t yet approved or cleared by the [place] FDA and  has been authorized for detection and/or diagnosis of SARS-CoV-2 by FDA under an Emergency Use Authorization (EUA). This EUA will remain  in effect (meaning this test can be used) for the duration of the COVID-19 declaration under Section 564(b)(1) of the Act, 21 U.S.C.section 360bbb-3(b)(1), unless the authorization is terminated  or revoked sooner.       Influenza A by PCR NEGATIVE NEGATIVE Final   Influenza B by PCR NEGATIVE NEGATIVE Final    Comment: (NOTE) The Xpert Xpress SARS-CoV-2/FLU/RSV plus assay is intended as an aid in the diagnosis of influenza from Nasopharyngeal swab specimens  and should not be used as a sole basis for treatment. Nasal washings and aspirates are unacceptable for Xpert Xpress SARS-CoV-2/FLU/RSV testing.  Fact Sheet for  Patients: EntrepreneurPulse.com.au  Fact Sheet for Healthcare Providers: IncredibleEmployment.be  This test is not yet approved or cleared by the [place] FDA and has been authorized for detection and/or diagnosis of SARS-CoV-2 by FDA under an Emergency Use Authorization (EUA). This EUA will remain in effect (meaning this test can be used) for the duration of the COVID-19 declaration under Section 564(b)(1) of the Act, 21 U.S.C. section 360bbb-3(b)(1), unless the authorization is terminated or revoked.  Performed at [facility], [place] [address], [place], [place] [address]   Culture, blood (routine x 2)     Status: None (Preliminary result)   Collection Time: 03/20/21  8:40 PM   Specimen: BLOOD  Result Value Ref Range Status   Specimen Description   Final    BLOOD BLOOD LEFT FOREARM Performed at [facility] [name] [place], [place] [address]    Special Requests   Final    BOTTLES DRAWN AEROBIC AND ANAEROBIC Blood Culture adequate volume Performed at [facility] [address] [place], [place] [address]    Culture   Final    NO GROWTH 4 DAYS Performed at [facility], [place] [address] [place], [place] [address]    Report Status PENDING  Incomplete  Culture, blood (routine x 2)     Status: None (Preliminary result)   Collection Time: 03/20/21  8:45 PM   Specimen: BLOOD  Result Value Ref Range Status   Specimen Description   Final    BLOOD BLOOD RIGHT FOREARM Performed at [facility] [address] [place], [place] [address]    Special Requests   Final    BOTTLES DRAWN AEROBIC AND ANAEROBIC Blood Culture adequate volume Performed at [facility] [address], [place], [place] [address]    Culture   Final    NO GROWTH 4 DAYS Performed at [facility], [place] [address], [place], [place] [address]    Report Status  PENDING  Incomplete  Respiratory (~20 pathogens) panel by PCR     Status: Abnormal   Collection Time: 03/21/21  3:26 AM   Specimen: Nasopharyngeal Swab; Respiratory  Result Value Ref Range Status   Adenovirus NOT DETECTED NOT DETECTED Final   Coronavirus 229E NOT DETECTED NOT DETECTED Final    Comment: (NOTE) The Coronavirus on the Respiratory Panel, DOES NOT test for the novel  Coronavirus (2019 nCoV)    Coronavirus HKU1 NOT DETECTED NOT DETECTED Final   Coronavirus NL63 NOT DETECTED NOT DETECTED Final   Coronavirus OC43 NOT DETECTED NOT DETECTED Final   Metapneumovirus NOT DETECTED NOT DETECTED Final   Rhinovirus / Enterovirus DETECTED (A) NOT DETECTED Final   Influenza A NOT DETECTED NOT DETECTED Final   Influenza B NOT DETECTED NOT DETECTED Final   Parainfluenza Virus 1 NOT DETECTED NOT DETECTED Final   Parainfluenza Virus 2 NOT DETECTED NOT DETECTED Final   Parainfluenza Virus 3 NOT DETECTED NOT DETECTED Final   Parainfluenza Virus 4 NOT DETECTED NOT DETECTED Final   Respiratory Syncytial Virus NOT DETECTED NOT DETECTED Final   Bordetella pertussis NOT DETECTED NOT DETECTED Final   Bordetella Parapertussis NOT DETECTED NOT DETECTED Final   Chlamydophila pneumoniae NOT DETECTED NOT DETECTED Final   Mycoplasma pneumoniae NOT DETECTED NOT DETECTED Final    Comment: Performed  at [facility], [place] [address] [place], [place] [address]     Time coordinating discharge: 35 minutes  SIGNED:   Aline August, MD  [facility] 03/24/2021, 9:01 AM

## 2021-03-24 NOTE — NC FL2 (Signed)
?[place] MEDICAID FL2 LEVEL OF CARE SCREENING TOOL  ?  ? ?IDENTIFICATION  ?Patient Name: ?[name] Birthdate: 1931-01-27 Sex: male Admission Date (Current Location): ?03/20/2021  ?[place] and [place] Number: ? [place] ?  Facility and Address:  ?[facility],  [place], [place] ?     Provider Number: ?[id number]  ?Attending Physician Name and Address:  ?Aline August, MD ? Relative Name and Phone Number:  ?Lucita Ferrara [phone] ?   ?Current Level of Care: ?Hospital Recommended Level of Care: ?Assisted Living Facility Prior Approval Number: ?  ? ?Date Approved/Denied: ?  PASRR Number: ?[id number] A ? ?Discharge Plan: ?Other (Comment) (ALF ([place])) ?  ? ?Current Diagnoses: ?Patient Active Problem List  ? Diagnosis Date Noted  ? Delirium 03/23/2021  ? Dementia ([place]) 03/23/2021  ? Hypothyroidism 03/21/2021  ? Pneumonia 03/20/2021  ? Secondary hypercoagulable state ([place]) 08/10/2019  ? Anemia of chronic disease 11/13/2018  ? Parkinson's disease ([place]) 11/13/2018  ? Paroxysmal atrial fibrillation (HCC)   ? Coronary artery disease involving native coronary artery of native heart without angina pectoris   ? Hyperlipidemia LDL goal <70   ? Urinary tract infection with hematuria   ? Non-ST elevation (NSTEMI) myocardial infarction [facility])   ? GERD (gastroesophageal reflux disease) 08/04/2017  ? Elevated troponin 08/03/2017  ? Chest pain 08/03/2017  ? CKD (chronic kidney disease), stage III ([place]) 08/03/2017  ? Type II diabetes mellitus with renal manifestations ([place]) 08/03/2017  ? Pseudophakia of both eyes 05/31/2017  ? Retained ureteral stent 04/22/2016  ? Ureteral calculus 01/25/2016  ? Benign essential HTN 10/24/2015  ? History of CVA (cerebrovascular accident) 10/14/2014  ? TIA (transient ischemic attack)   ? CME (cystoid macular edema), left 08/10/2013  ? Squamous cell carcinoma of eyelid 05/15/2013  ? Blepharitis 03/16/2013  ? Posterior vitreous detachment of right eye 10/23/2011   ? Exudative age-related macular degeneration ([place]) 07/28/2011  ? Nonproliferative diabetic retinopathy ([place]) 04/07/2011  ? Insomnia, unspecified 05/19/2010  ? ATAXIA 03/17/2010  ? DIZZINESS 03/06/2010  ? Chronic constipation 02/21/2008  ? OSTEOARTHRITIS, KNEE, SEVERE 01/12/2008  ? URTICARIA 04/22/2006  ? POPLITEAL CYST 04/22/2006  ? THYROIDECTOMY, SUBTOTAL, HX OF 04/22/2006  ? TOTAL KNEE REPLACEMENT, HX OF 04/22/2006  ? ? ?Orientation RESPIRATION BLADDER Height & Weight   ?  ?Self ? Normal Incontinent Weight: 152 lb 5.4 oz (69.1 kg) ?Height:  '5\' 11"'$  (180.3 cm)  ?BEHAVIORAL SYMPTOMS/MOOD NEUROLOGICAL BOWEL NUTRITION STATUS  ?Other (Comment) (has dementia, sometimes confused/aggressive)   Incontinent Diet (Regular)  ?AMBULATORY STATUS COMMUNICATION OF NEEDS Skin   ?Extensive Assist Verbally Other (Comment) (scratch marks right elbow) ?  ?  ?  ?    ?     ?     ? ? ?Personal Care Assistance Level of Assistance  ?Bathing, Feeding, Dressing Bathing Assistance: Maximum assistance ?Feeding assistance: Independent ?Dressing Assistance: Maximum assistance ?   ? ?Functional Limitations Info  ?Sight, Hearing, Speech Sight Info: Impaired ?Hearing Info: Impaired ?Speech Info: Adequate  ? ? ?SPECIAL CARE FACTORS FREQUENCY  ?PT (By licensed PT), OT (By licensed OT)   ?  ?PT Frequency: 2-3x/week ?OT Frequency: 2-3x/week ?  ?  ?  ?   ? ? ?Contractures Contractures Info: Not present  ? ? ?Additional Factors Info  ?Code Status, Allergies Code Status Info: DNR ?Allergies Info: Colchicine, Hibiclens (Chlorhexidine), Atorvastatin, Celebrex (Celecoxib), Codeine, Erythromycin, Ezetimibe, Iodinated Contrast Media, Oxycodone-acetaminophen, Simvastatin, Crestor (Rosuvastatin), Erythromycin, Hydroxychloroquine, Nitroglycerin, Protonix (Pantoprazole), Tetanus Toxoids, Cefpodoxime, Cefuroxime Axetil, Nitroglycerin, Vantin ?  ?  ?  ?   ? ? ? ?  Discharge Medications: ?STOP taking these medications   ?  ?brinzolamide 1 % ophthalmic  suspension ?Commonly known as: AZOPT ?   ?traMADol 50 MG tablet ?Commonly known as: ULTRAM ?   ?  ?   ?  ?TAKE these medications   ?  ?alfuzosin 10 MG 24 hr tablet ?Commonly known as: UROXATRAL ?Take 1 tablet (10 mg total) by mouth at bedtime. ?What changed: when to take this ?   ?amiodarone 200 MG tablet ?Commonly known as: Pacerone ?Take 1 tablet (200 mg total) by mouth daily. ?   ?apixaban 2.5 MG Tabs tablet ?Commonly known as: ELIQUIS ?Take 2.5 mg by mouth 2 (two) times daily. ?   ?ARTIFICIAL TEARS OP ?Place 1 drop into both eyes 4 (four) times daily. ?   ?brimonidine-timolol 0.2-0.5 % ophthalmic solution ?Commonly known as: COMBIGAN ?Place 1 drop into both eyes 2 (two) times daily. ?   ?carbidopa-levodopa 25-100 MG tablet ?Commonly known as: SINEMET IR ?Take 1 tablet by mouth 3 (three) times daily. 0800, 1400, and 2000 ?   ?clopidogrel 75 MG tablet ?Commonly known as: PLAVIX ?Take 75 mg by mouth daily. ?   ?Dextromethorphan-guaiFENesin 10-100 MG/5ML liquid ?Take 5 mLs by mouth in the morning, at noon, and at bedtime. For 10 days ?   ?diltiazem 120 MG 24 hr capsule ?Commonly known as: CARDIZEM CD ?Take 1 capsule (120 mg total) by mouth daily for 30 days. ?   ?docusate sodium 100 MG capsule ?Commonly known as: COLACE ?Take 100 mg by mouth 2 (two) times daily. ?   ?ferrous sulfate 325 (65 FE) MG EC tablet ?Take 1 tablet (325 mg total) by mouth 2 (two) times daily with a meal. ?   ?fluticasone 50 MCG/ACT nasal spray ?Commonly known as: FLONASE ?Place 2 sprays into both nostrils daily. ?   ?furosemide 20 MG tablet ?Commonly known as: LASIX ?Take 20 mg by mouth daily. ?   ?INSTA-GLUCOSE PO ?Take 1 Tube by mouth as needed (hypoglycemia or BS <60). ?   ?latanoprost 0.005 % ophthalmic solution ?Commonly known as: XALATAN ?Place 1 drop into the right eye at bedtime. ?   ?levothyroxine 150 MCG tablet ?Commonly known as: SYNTHROID ?Take 150 mcg by mouth daily. ?   ?loratadine 10 MG tablet ?Commonly known as: CLARITIN ?Take  10 mg by mouth at bedtime. ?   ?Melatonin 3 MG Subl ?Place 3 mg under the tongue at bedtime. ?   ?metoprolol succinate 25 MG 24 hr tablet ?Commonly known as: TOPROL-XL ?Take 1 tablet (25 mg total) by mouth daily. ?   ?mirabegron ER 50 MG Tb24 tablet ?Commonly known as: MYRBETRIQ ?Take 1 tablet (50 mg total) by mouth daily. ?   ?pantoprazole 40 MG tablet ?Commonly known as: PROTONIX ?Take 40 mg by mouth 2 (two) times daily. ?   ?polyethylene glycol 17 g packet ?Commonly known as: MIRALAX / GLYCOLAX ?Take 17 g by mouth daily. ?   ?Potassium Chloride ER 20 MEQ Tbcr ?Take 20 mEq by mouth daily. ?   ?predniSONE 20 MG tablet ?Commonly known as: DELTASONE ?Take 2 tablets (40 mg total) by mouth daily with breakfast for 5 days. ?   ?PreserVision/Lutein Caps ?Take 1 capsule by mouth 2 (two) times daily. ?   ?sertraline 25 MG tablet ?Commonly known as: ZOLOFT ?Take 25 mg by mouth daily. ?   ?Uro-MP 118 MG Caps ?Take 1 capsule by mouth 3 (three) times daily. ?   ?Vitamin D-3 125 MCG (5000 UT) Tabs ?Take 5,000 Units  by mouth daily. ?   ?  ? ?Relevant Imaging Results: ? ?Relevant Lab Results: ? ? ?Additional Information ?Covid-19 vaccine - Moderna x3; [phone] ? ?[place], LCSW ? ? ? ? ?

## 2021-03-25 ENCOUNTER — Other Ambulatory Visit: Payer: Self-pay

## 2021-03-25 ENCOUNTER — Encounter (HOSPITAL_COMMUNITY): Payer: Self-pay

## 2021-03-25 ENCOUNTER — Inpatient Hospital Stay (HOSPITAL_COMMUNITY)
Admission: EM | Admit: 2021-03-25 | Discharge: 2021-04-04 | DRG: 193 | Disposition: A | Discharge status: Skilled Nursing Facility | Payer: Medicare Other | Source: Skilled Nursing Facility | Attending: Internal Medicine | Admitting: Internal Medicine

## 2021-03-25 ENCOUNTER — Emergency Department (HOSPITAL_COMMUNITY): Payer: Medicare Other

## 2021-03-25 DIAGNOSIS — D638 Anemia in other chronic diseases classified elsewhere: Secondary | ICD-10-CM | POA: Diagnosis not present

## 2021-03-25 DIAGNOSIS — I2510 Atherosclerotic heart disease of native coronary artery without angina pectoris: Secondary | ICD-10-CM | POA: Diagnosis present

## 2021-03-25 DIAGNOSIS — I959 Hypotension, unspecified: Secondary | ICD-10-CM | POA: Diagnosis not present

## 2021-03-25 DIAGNOSIS — I10 Essential (primary) hypertension: Secondary | ICD-10-CM | POA: Diagnosis not present

## 2021-03-25 DIAGNOSIS — Z955 Presence of coronary angioplasty implant and graft: Secondary | ICD-10-CM

## 2021-03-25 DIAGNOSIS — Z888 Allergy status to other drugs, medicaments and biological substances status: Secondary | ICD-10-CM

## 2021-03-25 DIAGNOSIS — M40209 Unspecified kyphosis, site unspecified: Secondary | ICD-10-CM | POA: Diagnosis present

## 2021-03-25 DIAGNOSIS — M7989 Other specified soft tissue disorders: Secondary | ICD-10-CM | POA: Diagnosis present

## 2021-03-25 DIAGNOSIS — F0280 Dementia in other diseases classified elsewhere without behavioral disturbance: Secondary | ICD-10-CM | POA: Diagnosis present

## 2021-03-25 DIAGNOSIS — Z79890 Hormone replacement therapy (postmenopausal): Secondary | ICD-10-CM

## 2021-03-25 DIAGNOSIS — Y95 Nosocomial condition: Secondary | ICD-10-CM | POA: Diagnosis present

## 2021-03-25 DIAGNOSIS — E038 Other specified hypothyroidism: Secondary | ICD-10-CM | POA: Diagnosis not present

## 2021-03-25 DIAGNOSIS — N179 Acute kidney failure, unspecified: Secondary | ICD-10-CM | POA: Diagnosis not present

## 2021-03-25 DIAGNOSIS — Z515 Encounter for palliative care: Secondary | ICD-10-CM | POA: Diagnosis not present

## 2021-03-25 DIAGNOSIS — H3530 Unspecified macular degeneration: Secondary | ICD-10-CM | POA: Diagnosis present

## 2021-03-25 DIAGNOSIS — Z66 Do not resuscitate: Secondary | ICD-10-CM | POA: Diagnosis present

## 2021-03-25 DIAGNOSIS — E785 Hyperlipidemia, unspecified: Secondary | ICD-10-CM | POA: Diagnosis present

## 2021-03-25 DIAGNOSIS — Z7901 Long term (current) use of anticoagulants: Secondary | ICD-10-CM

## 2021-03-25 DIAGNOSIS — Z7189 Other specified counseling: Secondary | ICD-10-CM | POA: Diagnosis not present

## 2021-03-25 DIAGNOSIS — D631 Anemia in chronic kidney disease: Secondary | ICD-10-CM | POA: Diagnosis present

## 2021-03-25 DIAGNOSIS — R627 Adult failure to thrive: Secondary | ICD-10-CM | POA: Diagnosis present

## 2021-03-25 DIAGNOSIS — Z79899 Other long term (current) drug therapy: Secondary | ICD-10-CM

## 2021-03-25 DIAGNOSIS — E43 Unspecified severe protein-calorie malnutrition: Secondary | ICD-10-CM | POA: Diagnosis present

## 2021-03-25 DIAGNOSIS — Z20822 Contact with and (suspected) exposure to covid-19: Secondary | ICD-10-CM | POA: Diagnosis present

## 2021-03-25 DIAGNOSIS — E039 Hypothyroidism, unspecified: Secondary | ICD-10-CM | POA: Diagnosis present

## 2021-03-25 DIAGNOSIS — J309 Allergic rhinitis, unspecified: Secondary | ICD-10-CM | POA: Diagnosis present

## 2021-03-25 DIAGNOSIS — I480 Paroxysmal atrial fibrillation: Secondary | ICD-10-CM | POA: Diagnosis present

## 2021-03-25 DIAGNOSIS — Z8249 Family history of ischemic heart disease and other diseases of the circulatory system: Secondary | ICD-10-CM

## 2021-03-25 DIAGNOSIS — J9601 Acute respiratory failure with hypoxia: Secondary | ICD-10-CM | POA: Diagnosis not present

## 2021-03-25 DIAGNOSIS — K5900 Constipation, unspecified: Secondary | ICD-10-CM | POA: Diagnosis present

## 2021-03-25 DIAGNOSIS — I129 Hypertensive chronic kidney disease with stage 1 through stage 4 chronic kidney disease, or unspecified chronic kidney disease: Secondary | ICD-10-CM | POA: Diagnosis present

## 2021-03-25 DIAGNOSIS — N1832 Chronic kidney disease, stage 3b: Secondary | ICD-10-CM | POA: Diagnosis present

## 2021-03-25 DIAGNOSIS — E1122 Type 2 diabetes mellitus with diabetic chronic kidney disease: Secondary | ICD-10-CM | POA: Diagnosis present

## 2021-03-25 DIAGNOSIS — R5381 Other malaise: Secondary | ICD-10-CM | POA: Diagnosis not present

## 2021-03-25 DIAGNOSIS — I252 Old myocardial infarction: Secondary | ICD-10-CM

## 2021-03-25 DIAGNOSIS — E46 Unspecified protein-calorie malnutrition: Secondary | ICD-10-CM | POA: Diagnosis present

## 2021-03-25 DIAGNOSIS — Z96653 Presence of artificial knee joint, bilateral: Secondary | ICD-10-CM | POA: Diagnosis present

## 2021-03-25 DIAGNOSIS — Z6821 Body mass index (BMI) 21.0-21.9, adult: Secondary | ICD-10-CM

## 2021-03-25 DIAGNOSIS — E1129 Type 2 diabetes mellitus with other diabetic kidney complication: Secondary | ICD-10-CM | POA: Diagnosis present

## 2021-03-25 DIAGNOSIS — K2100 Gastro-esophageal reflux disease with esophagitis, without bleeding: Secondary | ICD-10-CM | POA: Diagnosis not present

## 2021-03-25 DIAGNOSIS — Z86718 Personal history of other venous thrombosis and embolism: Secondary | ICD-10-CM

## 2021-03-25 DIAGNOSIS — Z8546 Personal history of malignant neoplasm of prostate: Secondary | ICD-10-CM

## 2021-03-25 DIAGNOSIS — R4701 Aphasia: Secondary | ICD-10-CM | POA: Diagnosis present

## 2021-03-25 DIAGNOSIS — Z886 Allergy status to analgesic agent status: Secondary | ICD-10-CM

## 2021-03-25 DIAGNOSIS — G20 Parkinson's disease: Secondary | ICD-10-CM | POA: Diagnosis present

## 2021-03-25 DIAGNOSIS — R54 Age-related physical debility: Secondary | ICD-10-CM | POA: Diagnosis present

## 2021-03-25 DIAGNOSIS — K219 Gastro-esophageal reflux disease without esophagitis: Secondary | ICD-10-CM | POA: Diagnosis present

## 2021-03-25 DIAGNOSIS — Z87442 Personal history of urinary calculi: Secondary | ICD-10-CM

## 2021-03-25 DIAGNOSIS — Z885 Allergy status to narcotic agent status: Secondary | ICD-10-CM

## 2021-03-25 DIAGNOSIS — T17800A Unspecified foreign body in other parts of respiratory tract causing asphyxiation, initial encounter: Secondary | ICD-10-CM | POA: Diagnosis present

## 2021-03-25 DIAGNOSIS — J189 Pneumonia, unspecified organism: Secondary | ICD-10-CM | POA: Diagnosis present

## 2021-03-25 DIAGNOSIS — Z823 Family history of stroke: Secondary | ICD-10-CM

## 2021-03-25 DIAGNOSIS — Z881 Allergy status to other antibiotic agents status: Secondary | ICD-10-CM

## 2021-03-25 DIAGNOSIS — Z887 Allergy status to serum and vaccine status: Secondary | ICD-10-CM

## 2021-03-25 DIAGNOSIS — Z85828 Personal history of other malignant neoplasm of skin: Secondary | ICD-10-CM

## 2021-03-25 DIAGNOSIS — E1121 Type 2 diabetes mellitus with diabetic nephropathy: Secondary | ICD-10-CM | POA: Diagnosis not present

## 2021-03-25 DIAGNOSIS — Z8673 Personal history of transient ischemic attack (TIA), and cerebral infarction without residual deficits: Secondary | ICD-10-CM

## 2021-03-25 DIAGNOSIS — Z7902 Long term (current) use of antithrombotics/antiplatelets: Secondary | ICD-10-CM

## 2021-03-25 LAB — CBC WITH DIFFERENTIAL/PLATELET
Abs Immature Granulocytes: 0.22 10*3/uL — ABNORMAL HIGH (ref 0.00–0.07)
Basophils Absolute: 0 10*3/uL (ref 0.0–0.1)
Basophils Relative: 0 %
Eosinophils Absolute: 0 10*3/uL (ref 0.0–0.5)
Eosinophils Relative: 0 %
HCT: 36.8 % — ABNORMAL LOW (ref 39.0–52.0)
Hemoglobin: 12 g/dL — ABNORMAL LOW (ref 13.0–17.0)
Immature Granulocytes: 1 %
Lymphocytes Relative: 3 %
Lymphs Abs: 0.7 10*3/uL (ref 0.7–4.0)
MCH: 31.9 pg (ref 26.0–34.0)
MCHC: 32.6 g/dL (ref 30.0–36.0)
MCV: 97.9 fL (ref 80.0–100.0)
Monocytes Absolute: 0.9 10*3/uL (ref 0.1–1.0)
Monocytes Relative: 4 %
Neutro Abs: 21.6 10*3/uL — ABNORMAL HIGH (ref 1.7–7.7)
Neutrophils Relative %: 92 %
Platelets: 337 10*3/uL (ref 150–400)
RBC: 3.76 MIL/uL — ABNORMAL LOW (ref 4.22–5.81)
RDW: 12.2 % (ref 11.5–15.5)
WBC: 23.4 10*3/uL — ABNORMAL HIGH (ref 4.0–10.5)
nRBC: 0.1 % (ref 0.0–0.2)

## 2021-03-25 LAB — BLOOD GAS, VENOUS
Acid-Base Excess: 0.3 mmol/L (ref 0.0–2.0)
Bicarbonate: 26 mmol/L (ref 20.0–28.0)
O2 Saturation: 28.3 %
Patient temperature: 37
pCO2, Ven: 45 mmHg (ref 44–60)
pH, Ven: 7.37 (ref 7.25–7.43)
pO2, Ven: <31 mmHg — CL (ref 32–45)

## 2021-03-25 LAB — RESP PANEL BY RT-PCR (FLU A&B, COVID) ARPGX2
Influenza A by PCR: NEGATIVE
Influenza B by PCR: NEGATIVE
SARS Coronavirus 2 by RT PCR: NEGATIVE

## 2021-03-25 LAB — TROPONIN I (HIGH SENSITIVITY): Troponin I (High Sensitivity): 22 ng/L — ABNORMAL HIGH (ref <18)

## 2021-03-25 LAB — COMPREHENSIVE METABOLIC PANEL
ALT: 9 U/L (ref 0–44)
AST: 21 U/L (ref 15–41)
Albumin: 3.4 g/dL — ABNORMAL LOW (ref 3.5–5.0)
Alkaline Phosphatase: 96 U/L (ref 38–126)
Anion gap: 13 (ref 5–15)
BUN: 61 mg/dL — ABNORMAL HIGH (ref 8–23)
CO2: 23 mmol/L (ref 22–32)
Calcium: 8.8 mg/dL — ABNORMAL LOW (ref 8.9–10.3)
Chloride: 101 mmol/L (ref 98–111)
Creatinine, Ser: 2.24 mg/dL — ABNORMAL HIGH (ref 0.61–1.24)
GFR, Estimated: 28 mL/min — ABNORMAL LOW (ref >60)
Glucose, Bld: 209 mg/dL — ABNORMAL HIGH (ref 70–99)
Potassium: 4 mmol/L (ref 3.5–5.1)
Sodium: 137 mmol/L (ref 135–145)
Total Bilirubin: 0.7 mg/dL (ref 0.3–1.2)
Total Protein: 6.9 g/dL (ref 6.5–8.1)

## 2021-03-25 LAB — CULTURE, BLOOD (ROUTINE X 2)
Culture: NO GROWTH
Culture: NO GROWTH
Special Requests: ADEQUATE
Special Requests: ADEQUATE

## 2021-03-25 LAB — BRAIN NATRIURETIC PEPTIDE: B Natriuretic Peptide: 236.3 pg/mL — ABNORMAL HIGH (ref 0.0–100.0)

## 2021-03-25 LAB — GLUCOSE, CAPILLARY
Glucose-Capillary: 180 mg/dL — ABNORMAL HIGH (ref 70–99)
Glucose-Capillary: 242 mg/dL — ABNORMAL HIGH (ref 70–99)

## 2021-03-25 LAB — LACTIC ACID, PLASMA: Lactic Acid, Venous: 1.9 mmol/L (ref 0.5–1.9)

## 2021-03-25 MED ORDER — INSULIN ASPART 100 UNIT/ML IJ SOLN
0.0000 [IU] | Freq: Three times a day (TID) | INTRAMUSCULAR | Status: DC
  Administered 2021-03-26: 5 [IU] via SUBCUTANEOUS
  Administered 2021-03-26: 3 [IU] via SUBCUTANEOUS
  Administered 2021-03-26: 5 [IU] via SUBCUTANEOUS

## 2021-03-25 MED ORDER — CEFTRIAXONE SODIUM 2 G IJ SOLR
2.0000 g | Freq: Once | INTRAMUSCULAR | Status: AC
Start: 2021-03-25 — End: 2021-03-25
  Administered 2021-03-25: 2 g via INTRAVENOUS
  Filled 2021-03-25: qty 20

## 2021-03-25 MED ORDER — PREDNISONE 20 MG PO TABS
40.0000 mg | ORAL_TABLET | Freq: Every day | ORAL | Status: AC
  Administered 2021-03-25 – 2021-03-28 (×4): 40 mg via ORAL
  Filled 2021-03-25 (×4): qty 2

## 2021-03-25 MED ORDER — URO-MP 118 MG PO CAPS: 1.0000 | ORAL_CAPSULE | Freq: Three times a day (TID) | ORAL | Status: DC

## 2021-03-25 MED ORDER — PANTOPRAZOLE SODIUM 40 MG PO TBEC
40.0000 mg | DELAYED_RELEASE_TABLET | Freq: Two times a day (BID) | ORAL | Status: DC
Start: 2021-03-25 — End: 2021-04-04
  Administered 2021-03-25 – 2021-04-04 (×21): 40 mg via ORAL
  Filled 2021-03-25 (×21): qty 1

## 2021-03-25 MED ORDER — IPRATROPIUM-ALBUTEROL 0.5-2.5 (3) MG/3ML IN SOLN
3.0000 mL | Freq: Four times a day (QID) | RESPIRATORY_TRACT | Status: DC
  Administered 2021-03-25: 3 mL via RESPIRATORY_TRACT
  Filled 2021-03-25: qty 3

## 2021-03-25 MED ORDER — POLYVINYL ALCOHOL 1.4 % OP SOLN
1.0000 [drp] | Freq: Four times a day (QID) | OPHTHALMIC | Status: DC
  Administered 2021-03-25 – 2021-04-04 (×37): 1 [drp] via OPHTHALMIC
  Filled 2021-03-25: qty 15

## 2021-03-25 MED ORDER — FERROUS SULFATE 325 (65 FE) MG PO TABS
325.0000 mg | ORAL_TABLET | Freq: Two times a day (BID) | ORAL | Status: DC
  Administered 2021-03-25 – 2021-04-04 (×19): 325 mg via ORAL
  Filled 2021-03-25 (×20): qty 1

## 2021-03-25 MED ORDER — URELLE 81 MG PO TABS
1.0000 | ORAL_TABLET | Freq: Three times a day (TID) | ORAL | Status: DC
  Administered 2021-03-25 – 2021-04-04 (×29): 81 mg via ORAL
  Filled 2021-03-25 (×31): qty 1

## 2021-03-25 MED ORDER — TIMOLOL MALEATE 0.5 % OP SOLN
1.0000 [drp] | Freq: Two times a day (BID) | OPHTHALMIC | Status: DC
  Administered 2021-03-25 – 2021-04-04 (×20): 1 [drp] via OPHTHALMIC
  Filled 2021-03-25: qty 5

## 2021-03-25 MED ORDER — IPRATROPIUM-ALBUTEROL 0.5-2.5 (3) MG/3ML IN SOLN
3.0000 mL | Freq: Three times a day (TID) | RESPIRATORY_TRACT | Status: DC
  Administered 2021-03-25 – 2021-03-26 (×2): 3 mL via RESPIRATORY_TRACT
  Filled 2021-03-25 (×2): qty 3

## 2021-03-25 MED ORDER — APIXABAN 2.5 MG PO TABS
2.5000 mg | ORAL_TABLET | Freq: Two times a day (BID) | ORAL | Status: DC
  Administered 2021-03-25 – 2021-04-04 (×21): 2.5 mg via ORAL
  Filled 2021-03-25 (×21): qty 1

## 2021-03-25 MED ORDER — LATANOPROST 0.005 % OP SOLN
1.0000 [drp] | Freq: Every day | OPHTHALMIC | Status: DC
  Administered 2021-03-25 – 2021-04-03 (×10): 1 [drp] via OPHTHALMIC
  Filled 2021-03-25: qty 2.5

## 2021-03-25 MED ORDER — ALFUZOSIN HCL ER 10 MG PO TB24
10.0000 mg | ORAL_TABLET | Freq: Every evening | ORAL | Status: DC
  Administered 2021-03-25 – 2021-03-31 (×7): 10 mg via ORAL
  Filled 2021-03-25 (×10): qty 1

## 2021-03-25 MED ORDER — BRIMONIDINE TARTRATE-TIMOLOL 0.2-0.5 % OP SOLN: 1.0000 [drp] | Freq: Two times a day (BID) | OPHTHALMIC | Status: DC

## 2021-03-25 MED ORDER — MELATONIN 3 MG PO TABS
3.0000 mg | ORAL_TABLET | Freq: Every day | ORAL | Status: DC
  Administered 2021-03-25 – 2021-04-03 (×10): 3 mg via ORAL
  Filled 2021-03-25 (×10): qty 1

## 2021-03-25 MED ORDER — ACETAMINOPHEN 325 MG PO TABS
650.0000 mg | ORAL_TABLET | Freq: Four times a day (QID) | ORAL | Status: DC | PRN
  Administered 2021-03-26 – 2021-04-03 (×12): 650 mg via ORAL
  Filled 2021-03-25 (×15): qty 2

## 2021-03-25 MED ORDER — LEVOTHYROXINE SODIUM 50 MCG PO TABS
150.0000 ug | ORAL_TABLET | Freq: Every day | ORAL | Status: DC
  Administered 2021-03-26 – 2021-04-04 (×10): 150 ug via ORAL
  Filled 2021-03-25 (×10): qty 1

## 2021-03-25 MED ORDER — CARBIDOPA-LEVODOPA 25-100 MG PO TABS
1.0000 | ORAL_TABLET | Freq: Three times a day (TID) | ORAL | Status: DC
  Administered 2021-03-25 – 2021-04-04 (×30): 1 via ORAL
  Filled 2021-03-25 (×31): qty 1

## 2021-03-25 MED ORDER — BRINZOLAMIDE 1 % OP SUSP
1.0000 [drp] | Freq: Three times a day (TID) | OPHTHALMIC | Status: DC
  Administered 2021-03-25 – 2021-04-04 (×30): 1 [drp] via OPHTHALMIC
  Filled 2021-03-25: qty 10

## 2021-03-25 MED ORDER — SODIUM CHLORIDE 0.9 % IV SOLN
500.0000 mg | Freq: Once | INTRAVENOUS | Status: AC
  Administered 2021-03-25: 500 mg via INTRAVENOUS
  Filled 2021-03-25: qty 5

## 2021-03-25 MED ORDER — DOCUSATE SODIUM 100 MG PO CAPS
100.0000 mg | ORAL_CAPSULE | Freq: Two times a day (BID) | ORAL | Status: DC
Start: 2021-03-25 — End: 2021-04-04
  Administered 2021-03-25 – 2021-04-03 (×19): 100 mg via ORAL
  Filled 2021-03-25 (×21): qty 1

## 2021-03-25 MED ORDER — ONDANSETRON HCL 4 MG PO TABS: 4.0000 mg | ORAL_TABLET | Freq: Four times a day (QID) | ORAL | Status: DC | PRN

## 2021-03-25 MED ORDER — SODIUM CHLORIDE 0.9 % IV SOLN
2.0000 g | INTRAVENOUS | Status: AC
  Administered 2021-03-26 – 2021-03-30 (×5): 2 g via INTRAVENOUS
  Filled 2021-03-25 (×5): qty 20

## 2021-03-25 MED ORDER — SODIUM CHLORIDE 0.9 % IV SOLN
500.0000 mg | INTRAVENOUS | Status: DC
  Administered 2021-03-26 – 2021-03-28 (×3): 500 mg via INTRAVENOUS
  Filled 2021-03-25 (×4): qty 5

## 2021-03-25 MED ORDER — ACETAMINOPHEN 650 MG RE SUPP: 650.0000 mg | Freq: Four times a day (QID) | RECTAL | Status: DC | PRN

## 2021-03-25 MED ORDER — LORATADINE 10 MG PO TABS
10.0000 mg | ORAL_TABLET | Freq: Every day | ORAL | Status: DC
  Administered 2021-03-25 – 2021-04-03 (×10): 10 mg via ORAL
  Filled 2021-03-25 (×10): qty 1

## 2021-03-25 MED ORDER — MELATONIN 3 MG SL SUBL: 3.0000 mg | SUBLINGUAL_TABLET | Freq: Every day | SUBLINGUAL | Status: DC

## 2021-03-25 MED ORDER — BRIMONIDINE TARTRATE 0.2 % OP SOLN
1.0000 [drp] | Freq: Two times a day (BID) | OPHTHALMIC | Status: DC
  Administered 2021-03-25 – 2021-04-04 (×20): 1 [drp] via OPHTHALMIC
  Filled 2021-03-25: qty 5

## 2021-03-25 MED ORDER — ONDANSETRON HCL 4 MG/2ML IJ SOLN: 4.0000 mg | Freq: Four times a day (QID) | INTRAMUSCULAR | Status: DC | PRN

## 2021-03-25 NOTE — ED Provider Notes (Signed)
?[facility] DEPT ?Provider Note ? ? ?CSN: [id number] ?Arrival date & time: 03/25/21  5361 ? ?  ? ?History ? ?Chief Complaint  ?Patient presents with  ? Shortness of Breath  ? ? ?[name] is a 88 y.o. male. ? ?88 yo M with a chief complaints of shortness of breath.  Patient was just in the hospital with pneumonia, found to test positive for a virus and was taken off antibiotics.  Had had improvement and was discharged home.  Reportedly was having worsening difficulty breathing and decreased oral intake today.  Found to have an oxygen saturation in the mid 70s by EMS.  Improved with couple liters of oxygen.  Patient does not provide much history.  Tells me he does not feel like he is having trouble breathing.  Denies any chest pain or pressure. ? ? ? ?  ? ?Home Medications ?Prior to Admission medications   ?Medication Sig Start Date End Date Taking? Authorizing Provider  ?alfuzosin (UROXATRAL) 10 MG 24 hr tablet Take 1 tablet (10 mg total) by mouth at bedtime. ?Patient taking differently: Take 10 mg by mouth every evening. 02/10/16  Yes Kathie Rhodes, MD  ?amiodarone (PACERONE) 200 MG tablet Take 1 tablet (200 mg total) by mouth daily. 08/17/19  Yes Dwyane Dee, MD  ?apixaban (ELIQUIS) 2.5 MG TABS tablet Take 2.5 mg by mouth 2 (two) times daily.   Yes [provider]  ?brimonidine-timolol (COMBIGAN) 0.2-0.5 % ophthalmic solution Place 1 drop into both eyes 2 (two) times daily.   Yes [provider]  ?brinzolamide (AZOPT) 1 % ophthalmic suspension Place 1 drop into the right eye 3 (three) times daily.   Yes [provider]  ?carbidopa-levodopa (SINEMET IR) 25-100 MG tablet Take 1 tablet by mouth 3 (three) times daily. 0800, 1400, and 2000   Yes [provider]  ?Carboxymethylcellulose Sodium (ARTIFICIAL TEARS OP) Place 1 drop into both eyes 4 (four) times daily.   Yes [provider]  ?Cholecalciferol (VITAMIN D-3) 5000 units TABS Take  5,000 Units by mouth daily.   Yes [provider]  ?clopidogrel (PLAVIX) 75 MG tablet Take 75 mg by mouth daily.    Yes [provider]  ?Dextromethorphan-guaiFENesin 10-100 MG/5ML liquid Take 5 mLs by mouth in the morning, at noon, and at bedtime. For 10 days 03/18/21 03/27/21 Yes [provider]  ?Dextrose, Diabetic Use, (INSTA-GLUCOSE PO) Take 1 Tube by mouth as needed (hypoglycemia or BS <60).   Yes [provider]  ?diltiazem (CARDIZEM CD) 120 MG 24 hr capsule Take 1 capsule (120 mg total) by mouth daily for 30 days. 03/12/18  Yes Elodia Florence., MD  ?docusate sodium (COLACE) 100 MG capsule Take 100 mg by mouth 2 (two) times daily.    Yes [provider]  ?ferrous sulfate 325 (65 FE) MG EC tablet Take 1 tablet (325 mg total) by mouth 2 (two) times daily with a meal. 11/14/18 05/17/21 Yes Danford, Suann Larry, MD  ?fluticasone (FLONASE) 50 MCG/ACT nasal spray Place 2 sprays into both nostrils daily.   Yes [provider]  ?furosemide (LASIX) 20 MG tablet Take 20 mg by mouth daily. 03/05/20  Yes [provider]  ?latanoprost (XALATAN) 0.005 % ophthalmic solution Place 1 drop into the right eye at bedtime.  07/28/16  Yes [provider]  ?levothyroxine (SYNTHROID) 150 MCG tablet Take 150 mcg by mouth daily.    Yes [provider]  ?loratadine (CLARITIN) 10 MG tablet Take  10 mg by mouth at bedtime.   Yes [provider]  ?Melatonin 3 MG SUBL Place 3 mg under the tongue at bedtime. 11/11/18  Yes [provider]  ?Meth-Hyo-M Bl-Na Phos-Ph Sal (URO-MP) 118 MG CAPS Take 1 capsule by mouth 3 (three) times daily.   Yes [provider]  ?metoprolol succinate (TOPROL-XL) 25 MG 24 hr tablet Take 1 tablet (25 mg total) by mouth daily. 08/06/19  Yes Dwyane Dee, MD  ?mirabegron ER (MYRBETRIQ) 50 MG TB24 tablet Take 1 tablet (50 mg total) by mouth daily. 01/28/16  Yes McKenzie, Candee Furbish, MD  ?Multiple  Vitamins-Minerals (PRESERVISION/LUTEIN) CAPS Take 1 capsule by mouth 2 (two) times daily.   Yes [provider]  ?pantoprazole (PROTONIX) 40 MG tablet Take 40 mg by mouth 2 (two) times daily. 06/29/19  Yes [provider]  ?polyethylene glycol (MIRALAX / GLYCOLAX) packet Take 17 g by mouth daily.   Yes [provider]  ?Potassium Chloride ER 20 MEQ TBCR Take 20 mEq by mouth daily. 11/20/19  Yes [provider]  ?predniSONE (DELTASONE) 20 MG tablet Take 2 tablets (40 mg total) by mouth daily with breakfast for 5 days. 03/24/21 03/29/21 Yes Aline August, MD  ?sertraline (ZOLOFT) 25 MG tablet Take 25 mg by mouth daily.   Yes [provider]  ?   ? ?Allergies    ?Colchicine, Hibiclens [chlorhexidine], Atorvastatin, Celebrex [celecoxib], Codeine, Erythromycin, Ezetimibe, Iodinated contrast media, Oxycodone-acetaminophen, Simvastatin, Crestor [rosuvastatin], Erythromycin, Hydroxychloroquine, Nitroglycerin, Protonix [pantoprazole], Tetanus toxoids, Cefpodoxime, Cefuroxime axetil, Nitroglycerin, and Vantin   ? ?Review of Systems   ?Review of Systems ? ?Physical Exam ?Updated Vital Signs ?BP (!) 150/85   Pulse 75   Temp (!) 97.5 ?F (36.4 ?C) (Oral)   Resp (!) 23   Ht '5\' 11"'$  (1.803 m)   Wt 68.9 kg   SpO2 92%   BMI 21.20 kg/m?  ?Physical Exam ?Vitals and nursing note reviewed.  ?Constitutional:   ?   Appearance: He is well-developed.  ?HENT:  ?   Head: Normocephalic and atraumatic.  ?Eyes:  ?   Pupils: Pupils are equal, round, and reactive to light.  ?Neck:  ?   Vascular: No JVD.  ?Cardiovascular:  ?   Rate and Rhythm: Normal rate and regular rhythm.  ?   Heart sounds: No murmur heard. ?  No friction rub. No gallop.  ?Pulmonary:  ?   Effort: No respiratory distress.  ?   Breath sounds: No wheezing.  ?   Comments: Coarse breath sounds in all fields ?Abdominal:  ?   General: There is no distension.  ?   Tenderness: There is no abdominal tenderness. There is no guarding or  rebound.  ?Musculoskeletal:     ?   General: Normal range of motion.  ?   Cervical back: Normal range of motion and neck supple.  ?Skin: ?   Coloration: Skin is not pale.  ?   Findings: No rash.  ?Neurological:  ?   Mental Status: He is alert and oriented to person, place, and time.  ?Psychiatric:     ?   Behavior: Behavior normal.  ? ? ?ED Results / Procedures / Treatments   ?Labs ?(all labs ordered are listed, but only abnormal results are displayed) ?Labs Reviewed  ?CBC WITH DIFFERENTIAL/PLATELET - Abnormal; Notable for the following components:  ?    Result Value  ? WBC 23.4 (*)   ? RBC 3.76 (*)   ? Hemoglobin 12.0 (*)   ?  HCT 36.8 (*)   ? Neutro Abs 21.6 (*)   ? Abs Immature Granulocytes 0.22 (*)   ? All other components within normal limits  ?COMPREHENSIVE METABOLIC PANEL - Abnormal; Notable for the following components:  ? Glucose, Bld 209 (*)   ? BUN 61 (*)   ? Creatinine, Ser 2.24 (*)   ? Calcium 8.8 (*)   ? Albumin 3.4 (*)   ? GFR, Estimated 28 (*)   ? All other components within normal limits  ?BRAIN NATRIURETIC PEPTIDE - Abnormal; Notable for the following components:  ? B Natriuretic Peptide 236.3 (*)   ? All other components within normal limits  ?BLOOD GAS, VENOUS - Abnormal; Notable for the following components:  ? pO2, Ven <31 (*)   ? All other components within normal limits  ?TROPONIN I (HIGH SENSITIVITY) - Abnormal; Notable for the following components:  ? Troponin I (High Sensitivity) 22 (*)   ? All other components within normal limits  ?RESP PANEL BY RT-PCR (FLU A&B, COVID) [id number]  ?CULTURE, BLOOD (ROUTINE X 2)  ?CULTURE, BLOOD (ROUTINE X 2)  ?LACTIC ACID, PLASMA  ?LACTIC ACID, PLASMA  ? ? ?EKG ?EKG Interpretation ? ?Date/Time:  Tuesday March 25 2021 10:05:14 EDT ?Ventricular Rate:  74 ?PR Interval:  64 ?QRS Duration: 86 ?QT Interval:  420 ?QTC Calculation: 466 ?R Axis:   54 ?Text Interpretation: Sinus rhythm Short PR interval Anterior infarct, old No significant change since last tracing  Confirmed by Deno Etienne [phone]) on 03/25/2021 10:15:05 AM ? ?Radiology ?DG Chest Port 1 View ? ?Result Date: 03/25/2021 ?CLINICAL DATA:  Shortness of breath, fatigue EXAM: PORTABLE CHEST 1 VIEW COMPARISON:  03/20/2021 FINDINGS: Bibasilar ai

## 2021-03-25 NOTE — ED Triage Notes (Signed)
Per EMS, Pt, from [facility], c/o increasing SOB.  Pt was diagnosed w/ PNA x5 days ago.   ? ?Pt was 74% on RA at facilty.  ?

## 2021-03-25 NOTE — H&P (Signed)
?History and Physical  ? ? ?Patient: [name]:[id number] DOB: Apr 07, 1931 ?DOA: 03/25/2021 ?DOS: the patient was seen and examined on 03/25/2021 ?PCP: Patient, No Pcp Per (Inactive)  ?Patient coming from: SNF. ? ?Chief Complaint:  ?Chief Complaint  ?Patient presents with  ? Shortness of Breath  ? ?HPI: [name] is a 88 y.o. male with medical history significant of allergy rhinitis, osteoarthritis, kyphosis, asthmatic bronchitis, CAD, type II DM, stage III CKD, history of DVT, hyperlipidemia, GERD, impaired hearing, motor lithiasis, skin cancer, hypertension, hypothyroidism, macular degeneration, paroxysmal atrial fibrillation, Parkinson's disease, history of unspecified stroke, prostate cancer, PAF, urgency frequency syndrome, urticaria who was just discharged yesterday from the hospital due to viral pneumonia and is being sent from his facility due to dyspnea and decreased oral intake.  EMS stated he was 74% on room air.  The patient is unable to elaborate further, but answer simple questions.  He denied headache, back, chest or abdominal pain. ? ?ED course: Initial vital signs were temperature 97.5 ?F, pulse 76, respirations 23, BP 163/88 mmHg O2 sat 93% on nasal cannula oxygen.  The patient received ceftriaxone and azithromycin IVPB. ? ?Lab work: CBC showed a white count of 23.4 with 92% neutrophils, hemoglobin 12.0 g/dL platelets 337.  Venous blood gas was normal except for a decreased PO2 less than 31 mmHg.  Troponin was 22 ng/L, lactic acid was normal.  Coronavirus and influenza PCR negative.  CMP with normal electrolytes once calcium corrected to albumin.  Glucose 209, BUN 61 and creatinine 2.24 mg/dL.  Albumin was 3.4 g/dL, all other hepatic functions were normal. ? ?Imaging: One-view portable chest radiograph show bibasilar airspace disease concerning for pneumonia. ?  ?Review of Systems: As mentioned in the history of present illness. All other systems reviewed and are negative. ?Past Medical  History:  ?Diagnosis Date  ? Allergic rhinitis   ? Arthritis   ? arthritis-kyphosis  ? Asthmatic bronchitis   ? CAD in native artery   ? a. STEMI 07/2017 s/p DES to LCx (25% mRCA and 70% oOM1 residual).  ? CKD (chronic kidney disease) stage 3, GFR 30-59 ml/min (HCC)   ? DM type 2 (diabetes mellitus, type 2) ([place])   ? DVT, lower extremity, proximal, acute ([place])   ? Dyslipidemia   ? GERD (gastroesophageal reflux disease)   ? Hearing impaired   ? bilateral hearing aids  ? History of kidney stones   ? History of skin cancer   ? Hypertension   ? Hypothyroidism   ? Macular degeneration   ? injections done bilaterally recent - 08-22-13  ? Normocytic anemia   ? PAF (paroxysmal atrial fibrillation) ([place])   ? Parkinson's disease ([place])   ? Prostate cancer ([place])   ? Prostate cancer-radiation only,skin cancer-basal cell and last squamous cell right eye  ? Statin intolerance   ? Stroke [facility])   ? Urgency-frequency syndrome   ? Urticaria   ? ?Past Surgical History:  ?Procedure Laterality Date  ? BACK SURGERY    ? lumbar fracture  ? cataract surgery Bilateral   ? COLONOSCOPY W/ POLYPECTOMY    ? x2   ? CORONARY STENT INTERVENTION N/A 08/06/2017  ? Procedure: CORONARY STENT INTERVENTION;  Surgeon: Jettie Booze, MD;  Location: [place] [facility];  Service: Cardiovascular;  Laterality: N/A;  ? CYSTOSCOPY W/ URETERAL STENT PLACEMENT Right 08/13/2017  ? Procedure: CYSTOSCOPY WITH RIGHT  RETROGRADE PYELOGRAM/URETERAL STENT PLACEMENT;  Surgeon: Raynelle Bring, MD;  Location: [facility];  Service: Urology;  Laterality: Right;  ? CYSTOSCOPY WITH INJECTION N/A 09/11/2013  ? Procedure: CYSTOSCOPY WITH BOTOX INJECTION INTO THE BLADDER;  Surgeon: Ailene Rud, MD;  Location: [facility];  Service: Urology;  Laterality: N/A;  ? CYSTOSCOPY WITH RETROGRADE PYELOGRAM, URETEROSCOPY AND STENT PLACEMENT    ? CYSTOSCOPY WITH RETROGRADE PYELOGRAM, URETEROSCOPY AND STENT PLACEMENT Right 12/22/2018  ? Procedure: CYSTOSCOPY WITH RETROGRADE PYELOGRAM,  URETEROSCOPY AND STENT PLACEMENT;  Surgeon: Cleon Gustin, MD;  Location: [facility];  Service: Urology;  Laterality: Right;  90 MINS  ? CYSTOSCOPY WITH STENT PLACEMENT Right 01/26/2016  ? Procedure: CYSTOSCOPY, RIGHT RETROGRADE WITH RIGHT URETERAL STENT PLACEMENT;  Surgeon: Cleon Gustin, MD;  Location: [facility];  Service: Urology;  Laterality: Right;  ? CYSTOSCOPY/RETROGRADE/URETEROSCOPY Right 04/22/2016  ? Procedure: CYSTOSCOPY/RETROGRADE/ REMOVAL JJ STENT right insertion stent right insertion of foley;  Surgeon: Franchot Gallo, MD;  Location: [facility];  Service: Urology;  Laterality: Right;  ? EXTRACORPOREAL SHOCK WAVE LITHOTRIPSY Right 02/27/2016  ? Procedure: RIGHT EXTRACORPOREAL SHOCK WAVE LITHOTRIPSY (ESWL) GAITED;  Surgeon: Cleon Gustin, MD;  Location: [facility];  Service: Urology;  Laterality: Right;  ? EXTRACORPOREAL SHOCK WAVE LITHOTRIPSY Right 02/10/2016  ? Procedure: EXTRACORPOREAL SHOCK WAVE LITHOTRIPSY (ESWL);  Surgeon: Kathie Rhodes, MD;  Location: [facility];  Service: Urology;  Laterality: Right;  [place] [phone] ?[id number] ?BCBS- [id number] ?  ? EYE SURGERY    ? macular degeneration  ? GALLBLADDER SURGERY  2006  ? no cholecystectomy  ? HERNIA REPAIR    ? HOLMIUM LASER APPLICATION Right 75/10/2583  ? Procedure: HOLMIUM LASER APPLICATION;  Surgeon: Cleon Gustin, MD;  Location: [facility];  Service: Urology;  Laterality: Right;  ? LEFT HEART CATH AND CORONARY ANGIOGRAPHY N/A 08/06/2017  ? Procedure: LEFT HEART CATH AND CORONARY ANGIOGRAPHY;  Surgeon: Jettie Booze, MD;  Location: [place] [facility];  Service: Cardiovascular;  Laterality: N/A;  ? LUNG REMOVAL, PARTIAL  age 16  ? for bronchiectasis  ? PARTIAL THYMECTOMY  1985  ? for nodules  ? POPLITEAL SYNOVIAL CYST EXCISION  2005  ? TOTAL KNEE ARTHROPLASTY  2008  ? right  ? TOTAL KNEE ARTHROPLASTY  2010  ? left  ? ?Social History:  reports that he has never smoked. He has never used smokeless tobacco. He  reports that he does not drink alcohol and does not use drugs. ? ?Allergies  ?Allergen Reactions  ? Colchicine Anaphylaxis  ? Hibiclens [Chlorhexidine] Hives  ? Atorvastatin Other (See Comments)  ?  Joint pain   ? Celebrex [Celecoxib] Swelling  ? Codeine Hives  ? Erythromycin Diarrhea  ? Ezetimibe Other (See Comments)  ?  Joint pain   ? Iodinated Contrast Media Nausea And Vomiting and Rash  ? Oxycodone-Acetaminophen Hives  ? Simvastatin Other (See Comments)  ?  Joint pain   ? Crestor [Rosuvastatin] Other (See Comments)  ?  Joint pain  ? Erythromycin Other (See Comments)  ?  Listed on MAR  ? Hydroxychloroquine Other (See Comments)  ?  Listed on MAR  ? Nitroglycerin Other (See Comments)  ?  Listed on MAR  ? Protonix [Pantoprazole] Other (See Comments)  ?  unknown  ? Tetanus Toxoids Other (See Comments)  ?  Listed on MAR  ? Cefpodoxime Rash  ? Cefuroxime Axetil Diarrhea and Rash  ? Nitroglycerin Other (See Comments)  ?  Low[place] patient's B/P  ? Vantin Other (See Comments)  ?  Unknown  ? ? ?Family History  ?Problem Relation Age of Onset  ?  Other Sister   ?     ophth disease  ? Heart attack Father   ?     x7  ? Heart disease Father   ?     MI x7  ? Hypertension Mother   ? Stroke Mother 61  ? Amblyopia Neg Hx   ? Blindness Neg Hx   ? Glaucoma Neg Hx   ? Macular degeneration Neg Hx   ? Retinal detachment Neg Hx   ? Strabismus Neg Hx   ? Retinitis pigmentosa Neg Hx   ? Cataracts Neg Hx   ? ? ?Prior to Admission medications   ?Medication Sig Start Date End Date Taking? Authorizing Provider  ?alfuzosin (UROXATRAL) 10 MG 24 hr tablet Take 1 tablet (10 mg total) by mouth at bedtime. ?Patient taking differently: Take 10 mg by mouth every evening. 02/10/16  Yes Kathie Rhodes, MD  ?amiodarone (PACERONE) 200 MG tablet Take 1 tablet (200 mg total) by mouth daily. 08/17/19  Yes Dwyane Dee, MD  ?apixaban (ELIQUIS) 2.5 MG TABS tablet Take 2.5 mg by mouth 2 (two) times daily.   Yes [provider]  ?brimonidine-timolol  (COMBIGAN) 0.2-0.5 % ophthalmic solution Place 1 drop into both eyes 2 (two) times daily.   Yes [provider]  ?brinzolamide (AZOPT) 1 % ophthalmic suspension Place 1 drop into the right eye 3 (three) times da

## 2021-03-26 DIAGNOSIS — I10 Essential (primary) hypertension: Secondary | ICD-10-CM | POA: Diagnosis not present

## 2021-03-26 DIAGNOSIS — D638 Anemia in other chronic diseases classified elsewhere: Secondary | ICD-10-CM | POA: Diagnosis not present

## 2021-03-26 DIAGNOSIS — I2510 Atherosclerotic heart disease of native coronary artery without angina pectoris: Secondary | ICD-10-CM | POA: Diagnosis not present

## 2021-03-26 DIAGNOSIS — J9601 Acute respiratory failure with hypoxia: Secondary | ICD-10-CM | POA: Diagnosis not present

## 2021-03-26 LAB — COMPREHENSIVE METABOLIC PANEL
ALT: 8 U/L (ref 0–44)
AST: 14 U/L — ABNORMAL LOW (ref 15–41)
Albumin: 2.7 g/dL — ABNORMAL LOW (ref 3.5–5.0)
Alkaline Phosphatase: 80 U/L (ref 38–126)
Anion gap: 12 (ref 5–15)
BUN: 64 mg/dL — ABNORMAL HIGH (ref 8–23)
CO2: 21 mmol/L — ABNORMAL LOW (ref 22–32)
Calcium: 8 mg/dL — ABNORMAL LOW (ref 8.9–10.3)
Chloride: 104 mmol/L (ref 98–111)
Creatinine, Ser: 2.33 mg/dL — ABNORMAL HIGH (ref 0.61–1.24)
GFR, Estimated: 26 mL/min — ABNORMAL LOW (ref >60)
Glucose, Bld: 304 mg/dL — ABNORMAL HIGH (ref 70–99)
Potassium: 3.5 mmol/L (ref 3.5–5.1)
Sodium: 137 mmol/L (ref 135–145)
Total Bilirubin: 0.5 mg/dL (ref 0.3–1.2)
Total Protein: 5.5 g/dL — ABNORMAL LOW (ref 6.5–8.1)

## 2021-03-26 LAB — CBC
HCT: 28.6 % — ABNORMAL LOW (ref 39.0–52.0)
Hemoglobin: 9.3 g/dL — ABNORMAL LOW (ref 13.0–17.0)
MCH: 32.2 pg (ref 26.0–34.0)
MCHC: 32.5 g/dL (ref 30.0–36.0)
MCV: 99 fL (ref 80.0–100.0)
Platelets: 245 10*3/uL (ref 150–400)
RBC: 2.89 MIL/uL — ABNORMAL LOW (ref 4.22–5.81)
RDW: 12.3 % (ref 11.5–15.5)
WBC: 20.9 10*3/uL — ABNORMAL HIGH (ref 4.0–10.5)
nRBC: 0 % (ref 0.0–0.2)

## 2021-03-26 LAB — GLUCOSE, CAPILLARY
Glucose-Capillary: 261 mg/dL — ABNORMAL HIGH (ref 70–99)
Glucose-Capillary: 265 mg/dL — ABNORMAL HIGH (ref 70–99)
Glucose-Capillary: 247 mg/dL — ABNORMAL HIGH (ref 70–99)
Glucose-Capillary: 208 mg/dL — ABNORMAL HIGH (ref 70–99)

## 2021-03-26 LAB — STREP PNEUMONIAE URINARY ANTIGEN: Strep Pneumo Urinary Antigen: NEGATIVE

## 2021-03-26 LAB — LACTIC ACID, PLASMA
Lactic Acid, Venous: 1.9 mmol/L (ref 0.5–1.9)
Lactic Acid, Venous: 2.2 mmol/L (ref 0.5–1.9)

## 2021-03-26 MED ORDER — HALOPERIDOL LACTATE 5 MG/ML IJ SOLN
1.0000 mg | Freq: Four times a day (QID) | INTRAMUSCULAR | Status: DC | PRN
  Administered 2021-03-26 – 2021-04-01 (×2): 2 mg via INTRAVENOUS
  Filled 2021-03-26 (×2): qty 1

## 2021-03-26 MED ORDER — HALOPERIDOL LACTATE 5 MG/ML IJ SOLN
1.0000 mg | Freq: Once | INTRAMUSCULAR | Status: AC
  Administered 2021-03-26: 1 mg via INTRAMUSCULAR
  Filled 2021-03-26: qty 1

## 2021-03-26 MED ORDER — TRAMADOL HCL 50 MG PO TABS
50.0000 mg | ORAL_TABLET | Freq: Once | ORAL | Status: AC
  Administered 2021-03-26: 50 mg via ORAL
  Filled 2021-03-26: qty 1

## 2021-03-26 MED ORDER — ALBUTEROL SULFATE (2.5 MG/3ML) 0.083% IN NEBU
2.5000 mg | INHALATION_SOLUTION | Freq: Two times a day (BID) | RESPIRATORY_TRACT | Status: DC
  Administered 2021-03-26 – 2021-04-02 (×14): 2.5 mg via RESPIRATORY_TRACT
  Filled 2021-03-26 (×16): qty 3

## 2021-03-26 MED ORDER — INSULIN ASPART 100 UNIT/ML IJ SOLN
0.0000 [IU] | INTRAMUSCULAR | Status: DC
  Administered 2021-03-26: 5 [IU] via SUBCUTANEOUS
  Administered 2021-03-27: 3 [IU] via SUBCUTANEOUS
  Administered 2021-03-27: 5 [IU] via SUBCUTANEOUS
  Administered 2021-03-27: 3 [IU] via SUBCUTANEOUS
  Administered 2021-03-27: 2 [IU] via SUBCUTANEOUS
  Administered 2021-03-27 – 2021-03-28 (×2): 3 [IU] via SUBCUTANEOUS
  Administered 2021-03-28: 2 [IU] via SUBCUTANEOUS
  Administered 2021-03-28: 3 [IU] via SUBCUTANEOUS
  Administered 2021-03-28: 2 [IU] via SUBCUTANEOUS
  Administered 2021-03-28 – 2021-03-29 (×5): 3 [IU] via SUBCUTANEOUS
  Administered 2021-03-29: 8 [IU] via SUBCUTANEOUS
  Administered 2021-03-30: 2 [IU] via SUBCUTANEOUS
  Administered 2021-03-30 (×2): 5 [IU] via SUBCUTANEOUS
  Administered 2021-03-30: 2 [IU] via SUBCUTANEOUS
  Administered 2021-03-30 – 2021-03-31 (×3): 3 [IU] via SUBCUTANEOUS
  Administered 2021-03-31 (×3): 2 [IU] via SUBCUTANEOUS
  Administered 2021-04-01 (×2): 3 [IU] via SUBCUTANEOUS
  Administered 2021-04-01: 2 [IU] via SUBCUTANEOUS
  Administered 2021-04-02 (×2): 3 [IU] via SUBCUTANEOUS

## 2021-03-26 MED ORDER — QUETIAPINE FUMARATE 25 MG PO TABS
25.0000 mg | ORAL_TABLET | Freq: Every day | ORAL | Status: DC
  Administered 2021-03-26 – 2021-04-03 (×9): 25 mg via ORAL
  Filled 2021-03-26 (×9): qty 1

## 2021-03-26 MED ORDER — ORAL CARE MOUTH RINSE
15.0000 mL | Freq: Two times a day (BID) | OROMUCOSAL | Status: DC
  Administered 2021-03-26 – 2021-04-04 (×18): 15 mL via OROMUCOSAL

## 2021-03-26 NOTE — Progress Notes (Signed)
Chaplain attempted to provide support for family two times during the day but family was not at bedside.  We will attempt to follow up at a later time, but please also page [place] as needs arise. ? ?[employer], Bcc ?Pager, [phone] ?

## 2021-03-26 NOTE — Progress Notes (Signed)
Pt pleasantly confused throughout night shift until 0430 when pt became agitated and removed IV. RN notified on-call provider and order completed. RN continue to monitor pt condition. ?

## 2021-03-26 NOTE — Progress Notes (Signed)
Pt allowed [place] to obtain his blood sugar but became very aggressive during it and would not allow [place] to check his vitals. MD notified. ?

## 2021-03-26 NOTE — TOC Initial Note (Signed)
Transition of Care (TOC) - Initial/Assessment Note  ? ? ?Patient Details  ?Name: [name] ?MRN: [id number] ?Date of Birth: [birth year] ? ?Transition of Care (TOC) CM/SW Contact:    ?Tawanna Cooler, RN ?Phone Number: ?03/26/2021, 10:21 AM ? ?Clinical Narrative:                 ? ?Patient from [facility].  At last discharge a few days ago, returned to [facility] and was set up with [facility] and [facility] outpatient palliative services.   ?TOC following.  ? ? ?Expected Discharge Plan: Assisted Living ?Barriers to Discharge: Continued Medical Work up ? ? ? ?Expected Discharge Plan and Services ?Expected Discharge Plan: Assisted Living ?  ?  ?  ? ?Prior Living Arrangements/Services ?  ?Lives with:: Facility Resident (lives at [facility]) ?Patient language and need for interpreter reviewed:: Yes ?       ?Need for Family Participation in Patient Care: Yes (Comment) ?Care giver support system in place?: Yes (comment) ?Current home services: Other (comment) ([facility] services) ?Criminal Activity/Legal Involvement Pertinent to Current Situation/Hospitalization: No - Comment as needed ? ?Activities of Daily Living ?  ?ADL Screening (condition at time of admission) ?Patient's cognitive ability adequate to safely complete daily activities?: No ?Is the patient deaf or have difficulty hearing?: No ?Does the patient have difficulty seeing, even when wearing glasses/contacts?: Yes ?Does the patient have difficulty concentrating, remembering, or making decisions?: Yes ?Patient able to express need for assistance with ADLs?: No ?Does the patient have difficulty dressing or bathing?: Yes ?Independently performs ADLs?: No ?Communication: Independent ?Dressing (OT): Dependent ?Grooming: Dependent ?Feeding: Needs assistance ?Bathing: Dependent ?Toileting: Needs assistance ?In/Out Bed: Dependent ?Walks in Home: Dependent ?Does the patient have difficulty walking or climbing stairs?: Yes ?Weakness of  Legs: Both ?Weakness of Arms/Hands: Both ? ?  ?Alcohol / Substance Use: Not Applicable ?Psych Involvement: No (comment) ? ?Admission diagnosis:  Acute respiratory failure with hypoxia ([place]) [J96.01] ?Multifocal pneumonia [J18.9] ?Patient Active Problem List  ? Diagnosis Date Noted  ? Acute respiratory failure with hypoxia ([place]) 03/25/2021  ? Physical deconditioning 03/25/2021  ? Delirium 03/23/2021  ? Dementia ([place]) 03/23/2021  ? Hypothyroidism 03/21/2021  ? Pneumonia 03/20/2021  ? Secondary hypercoagulable state ([place]) 08/10/2019  ? Anemia of chronic disease 11/13/2018  ? Parkinson's disease ([place]) 11/13/2018  ? Paroxysmal atrial fibrillation (HCC)   ? Coronary artery disease involving native coronary artery of native heart without angina pectoris   ? Hyperlipidemia LDL goal <70   ? Urinary tract infection with hematuria   ? Non-ST elevation (NSTEMI) myocardial infarction [facility])   ? GERD (gastroesophageal reflux disease) 08/04/2017  ? Elevated troponin 08/03/2017  ? Chest pain 08/03/2017  ? CKD (chronic kidney disease), stage III ([place]) 08/03/2017  ? Type II diabetes mellitus with renal manifestations ([place]) 08/03/2017  ? Pseudophakia of both eyes 05/31/2017  ? Retained ureteral stent 04/22/2016  ? Ureteral calculus 01/25/2016  ? Benign essential HTN 10/24/2015  ? History of CVA (cerebrovascular accident) 10/14/2014  ? TIA (transient ischemic attack)   ? CME (cystoid macular edema), left 08/10/2013  ? Squamous cell carcinoma of eyelid 05/15/2013  ? Blepharitis 03/16/2013  ? Posterior vitreous detachment of right eye 10/23/2011  ? Exudative age-related macular degeneration ([place]) 07/28/2011  ? Nonproliferative diabetic retinopathy ([place]) 04/07/2011  ? Insomnia, unspecified 05/19/2010  ? ATAXIA 03/17/2010  ? DIZZINESS 03/06/2010  ? Chronic constipation 02/21/2008  ? OSTEOARTHRITIS, KNEE, SEVERE 01/12/2008  ? URTICARIA 04/22/2006  ?  POPLITEAL CYST 04/22/2006  ? THYROIDECTOMY, SUBTOTAL, HX OF 04/22/2006  ? TOTAL KNEE  REPLACEMENT, HX OF 04/22/2006  ? ?PCP:  Patient, No Pcp Per (Inactive) ?Pharmacy:   ?[facility] ?[place] [address] ?Phone: [phone] Fax: [phone] ? ? ? ?Readmission Risk Interventions ?Readmission Risk Prevention Plan 03/26/2021  ?Transportation Screening Complete  ?PCP or Specialist Appt within 3-5 Days Complete  ?[place] or Home Care Consult Complete  ?Social Work Consult for [place] Planning/Counseling Complete  ?Palliative Care Screening Complete  ?Medication Review Press photographer) Complete  ?Some recent data might be hidden  ? ? ? ?

## 2021-03-26 NOTE — Progress Notes (Signed)
?PROGRESS NOTE ? ? ? ?[name]  ZOX:[id number] DOB: [birth year] DOA: 03/25/2021 ?PCP: Patient, No Pcp Per (Inactive)  ? ? ? ?Brief Narrative:  ?88 y.o. WM PMHx allergy rhinitis, osteoarthritis, kyphosis, asthmatic bronchitis, CAD, type II DM, stage III CKD, history of DVT, hyperlipidemia, GERD, impaired hearing, motor lithiasis, skin cancer, hypertension, hypothyroidism, macular degeneration, paroxysmal atrial fibrillation, Parkinson's disease, history of unspecified stroke, prostate cancer, PAF, urgency frequency syndrome, urticaria who was just discharged yesterday from the hospital due to viral pneumonia and is being sent from his facility due to dyspnea and decreased oral intake.  EMS stated he was 74% on room air.  The patient is unable to elaborate further, but answer simple questions.  He denied headache, back, chest or abdominal pain. ? ?ED course: Initial vital signs were temperature 97.5 ?F, pulse 76, respirations 23, BP 163/88 mmHg O2 sat 93% on nasal cannula oxygen.  The patient received ceftriaxone and azithromycin IVPB. ?  ?Lab work: CBC showed a white count of 23.4 with 92% neutrophils, hemoglobin 12.0 g/dL platelets 337.  Venous blood gas was normal except for a decreased PO2 less than 31 mmHg.  Troponin was 22 ng/L, lactic acid was normal.  Coronavirus and influenza PCR negative.  CMP with normal electrolytes once calcium corrected to albumin.  Glucose 209, BUN 61 and creatinine 2.24 mg/dL.  Albumin was 3.4 g/dL, all other hepatic functions were normal. ?  ?Imaging: One-view portable chest radiograph show bibasilar airspace disease concerning for pneumonia. ? ? ?Subjective: ?Somnolent withdraws to painful stimuli ? ? ?Assessment & Plan: ?Covid vaccination; ?  ?Principal Problem: ?  Acute respiratory failure with hypoxia ([place]) ?Active Problems: ?  Pneumonia ?  Type II diabetes mellitus with renal manifestations ([place]) ?  Benign essential HTN ?  Paroxysmal atrial fibrillation (HCC) ?  Parkinson's  disease ([place]) ?  Coronary artery disease involving native coronary artery of native heart without angina pectoris ?  Hypothyroidism ?  Anemia of chronic disease ?  GERD (gastroesophageal reflux disease) ?  Hyperlipidemia LDL goal <70 ?  Physical deconditioning ? ? ? Acute respiratory failure with hypoxia ([place]) ?-In the setting of viral versus bacterial Pneumonia ?-Continuous pulse ox monitoring ?- Titrate O2 to maintain SPO2> 92% ?-DuoNeb TID ?-Complete 5-day course antibiotics ?-Check sputum Gram stain. ?-Check strep pneumoniae urinary antigen. ? ? ?DM type II with renal manifestations ([place]) ?-3/15 hemoglobin A1c pending ?- 3/15 lipid panel pending ?-.  3/15 increase moderate SSI ? ?  ?Hyperlipidemia LDL goal <70 ?-Not on medical therapy at this time. ?-See DM type II ?  ?Benign Essential HTN ?-Currently hold all BP medication  ?  ?Paroxysmal atrial fibrillation (HCC) ?-3/15 currently NSR . ?-3/15 continue apixaban 2.5 mg p.o. twice daily. ? ?CAD involving native coronary artery of native heart without angina pectoris ?-See A-fib  ? ?Parkinson's disease ([place]) ?-Continue Sinemet p.o. 3 times daily. ? ?Dementia ?- Patient appears to have some underlying dementia. ?-3/15 Haldol PRN ?- 3/15 Seroquel 25 mg QHS ? ?Hypothyroidism ?-3/15 TSH pending ?-levothyroxine 150 mcg p.o. daily. ?  ?Anemia of chronic disease ?-Monitor hematocrit hemoglobin. ?  ?GERD (gastroesophageal reflux disease) ?pantoprazole 40 mg p.o. daily. ?  ?  Physical deconditioning ?Given overall state of health: ?Consider palliative care evaluation if no improvement. ? ? ?  ? ? ?DVT prophylaxis: Apixaban ?Code Status: DNR ?Family Communication:  ?Status is: Inpatient ? ? ? ?Dispo: The patient is from: SNF ?  Anticipated d/c is to: SNF ?             Anticipated d/c date is: 3 days ?             Patient currently is not medically stable to d/c. ? ? ? ? ? ?Consultants:  ? ? ?Procedures/Significant Events:  ? ? ?I have personally reviewed and  interpreted all radiology studies and my findings are as above. ? ?VENTILATOR SETTINGS: ?Nasal cannula 3/15 ?Flow 3 L/min ?SPO2 93% ? ? ?Cultures ?3/14 blood pending ?3/14 influenza A/B negative ?3/14 SARS coronavirus negative ?3/15 respiratory virus panel pending ? ? ?Antimicrobials: ?Anti-infectives (From admission, onward)  ? ? Start     Dose/Rate Route Frequency Ordered Stop  ? 03/26/21 1100  cefTRIAXone (ROCEPHIN) 2 g in sodium chloride 0.9 % 100 mL IVPB       ? 2 g ?200 mL/hr over 30 Minutes Intravenous Every 24 hours 03/25/21 1306 03/31/21 1059  ? 03/26/21 1100  azithromycin (ZITHROMAX) 500 mg in sodium chloride 0.9 % 250 mL IVPB       ? 500 mg ?250 mL/hr over 60 Minutes Intravenous Every 24 hours 03/25/21 1306 03/31/21 1059  ? 03/25/21 1600  Uro-MP 118 MG CAPS 1 capsule  Status:  Discontinued       ? 1 capsule Oral 3 times daily 03/25/21 1305 03/25/21 1354  ? 03/25/21 1600  Urelle (URELLE/URISED) 81 MG tablet 81 mg       ? 1 tablet Oral 3 times daily 03/25/21 1354    ? 03/25/21 1000  cefTRIAXone (ROCEPHIN) 2 g in sodium chloride 0.9 % 100 mL IVPB       ? 2 g ?200 mL/hr over 30 Minutes Intravenous  Once 03/25/21 0947 03/25/21 1134  ? 03/25/21 1000  azithromycin (ZITHROMAX) 500 mg in sodium chloride 0.9 % 250 mL IVPB       ? 500 mg ?250 mL/hr over 60 Minutes Intravenous  Once 03/25/21 0947 03/25/21 1208  ? ?  ?  ? ? ?Devices ?  ? ?LINES / TUBES:  ? ? ? ? ?Continuous Infusions: ? azithromycin    ? cefTRIAXone (ROCEPHIN)  IV    ? ? ? ?Objective: ?Vitals:  ? 03/25/21 2056 03/25/21 2249 03/26/21 0223 03/26/21 0558  ?BP:  (!) 117/58 119/74 (!) 171/85  ?Pulse:  78 77 82  ?Resp:  '20 18 18  '$ ?Temp:  98.2 ?F (36.8 ?C) 97.8 ?F (36.6 ?C) 97.8 ?F (36.6 ?C)  ?TempSrc:  Oral Oral Oral  ?SpO2: 93% 92% 95% 93%  ?Weight:      ?Height:      ? ?No intake or output data in the 24 hours ending 03/26/21 0910 ?Filed Weights  ? 03/25/21 0951  ?Weight: 68.9 kg  ? ? ?Examination: ? ?General: Somnolent, withdrawals to painful stimuli,  does not follow commands No acute respiratory distress ?Eyes: negative scleral hemorrhage, negative anisocoria, negative icterus ?ENT: Negative Runny nose, negative gingival bleeding, ?Neck:  Negative scars, masses, torticollis, lymphadenopathy, JVD ?Lungs: Clear to auscultation bilaterally without wheezes or crackles ?Cardiovascular: Regular rate and rhythm without murmur gallop or rub normal S1 and S2 ?Abdomen: negative abdominal pain, nondistended, positive soft, bowel sounds, no rebound, no ascites, no appreciable mass ?Extremities: No significant cyanosis, clubbing, or edema bilateral lower extremities ?Skin: Negative rashes, lesions, ulcers ?Psychiatric: Unable to evaluate secondary to patient's somnolence ?Central nervous system: Withdraws to painful stimuli, Unable to evaluate secondary to patient's somnolence ? ?.  ? ? ? ?Data Reviewed:  Care during the described time interval was provided by me .  I have reviewed this patient's available data, including medical history, events of note, physical examination, and all test results as part of my evaluation. ? ?CBC: ?Recent Labs  ?Lab 03/20/21 ?1716 03/21/21 ?0501 03/25/21 ?1046 03/26/21 ?8315  ?WBC 10.1 10.0 23.4* 20.9*  ?NEUTROABS 7.7  --  21.6*  --   ?HGB 10.2* 10.7* 12.0* 9.3*  ?HCT 32.4* 34.8* 36.8* 28.6*  ?MCV 101.9* 102.1* 97.9 99.0  ?PLT 214 209 337 245  ? ?Basic Metabolic Panel: ?Recent Labs  ?Lab 03/22/21 ?0615 03/23/21 ?0533 03/24/21 ?0410 03/25/21 ?1046 03/26/21 ?1761  ?NA 136 135 136 137 137  ?K 3.8 4.2 3.9 4.0 3.5  ?CL 103 102 104 101 104  ?CO2 '22 25 23 23 '$ 21*  ?GLUCOSE 149* 222* 196* 209* 304*  ?BUN 39* 55* 61* 61* 64*  ?CREATININE 1.70* 2.10* 2.09* 2.24* 2.33*  ?CALCIUM 8.6* 8.7* 8.4* 8.8* 8.0*  ?MG 2.0 2.4 2.3  --   --   ? ?GFR: ?Estimated Creatinine Clearance: 21.8 mL/min (A) (by C-G formula based on SCr of 2.33 mg/dL (H)). ?Liver Function Tests: ?Recent Labs  ?Lab 03/20/21 ?1716 03/25/21 ?1046 03/26/21 ?6073  ?AST 9* 21 14*  ?ALT '5 9 8   '$ ?ALKPHOS 89 96 80  ?BILITOT 0.4 0.7 0.5  ?PROT 6.4* 6.9 5.5*  ?ALBUMIN 3.4* 3.4* 2.7*  ? ?Recent Labs  ?Lab 03/20/21 ?1716  ?LIPASE 34  ? ?No results for input(s): AMMONIA in the last 168 hours. ?Coagulation Prof

## 2021-03-27 DIAGNOSIS — I10 Essential (primary) hypertension: Secondary | ICD-10-CM | POA: Diagnosis not present

## 2021-03-27 DIAGNOSIS — J9601 Acute respiratory failure with hypoxia: Secondary | ICD-10-CM | POA: Diagnosis not present

## 2021-03-27 DIAGNOSIS — J189 Pneumonia, unspecified organism: Secondary | ICD-10-CM

## 2021-03-27 DIAGNOSIS — E43 Unspecified severe protein-calorie malnutrition: Secondary | ICD-10-CM

## 2021-03-27 DIAGNOSIS — D638 Anemia in other chronic diseases classified elsewhere: Secondary | ICD-10-CM | POA: Diagnosis not present

## 2021-03-27 DIAGNOSIS — R5381 Other malaise: Secondary | ICD-10-CM

## 2021-03-27 DIAGNOSIS — K2100 Gastro-esophageal reflux disease with esophagitis, without bleeding: Secondary | ICD-10-CM

## 2021-03-27 DIAGNOSIS — E038 Other specified hypothyroidism: Secondary | ICD-10-CM

## 2021-03-27 DIAGNOSIS — I2510 Atherosclerotic heart disease of native coronary artery without angina pectoris: Secondary | ICD-10-CM | POA: Diagnosis not present

## 2021-03-27 LAB — CBC WITH DIFFERENTIAL/PLATELET
Abs Immature Granulocytes: 0.33 10*3/uL — ABNORMAL HIGH (ref 0.00–0.07)
Basophils Absolute: 0 10*3/uL (ref 0.0–0.1)
Basophils Relative: 0 %
Eosinophils Absolute: 0 10*3/uL (ref 0.0–0.5)
Eosinophils Relative: 0 %
HCT: 31 % — ABNORMAL LOW (ref 39.0–52.0)
Hemoglobin: 10 g/dL — ABNORMAL LOW (ref 13.0–17.0)
Immature Granulocytes: 2 %
Lymphocytes Relative: 6 %
Lymphs Abs: 1.2 10*3/uL (ref 0.7–4.0)
MCH: 32.1 pg (ref 26.0–34.0)
MCHC: 32.3 g/dL (ref 30.0–36.0)
MCV: 99.4 fL (ref 80.0–100.0)
Monocytes Absolute: 0.9 10*3/uL (ref 0.1–1.0)
Monocytes Relative: 4 %
Neutro Abs: 17.9 10*3/uL — ABNORMAL HIGH (ref 1.7–7.7)
Neutrophils Relative %: 88 %
Platelets: 290 10*3/uL (ref 150–400)
RBC: 3.12 MIL/uL — ABNORMAL LOW (ref 4.22–5.81)
RDW: 12.6 % (ref 11.5–15.5)
WBC: 20.3 10*3/uL — ABNORMAL HIGH (ref 4.0–10.5)
nRBC: 0 % (ref 0.0–0.2)

## 2021-03-27 LAB — RESPIRATORY PANEL BY PCR

## 2021-03-27 LAB — COMPREHENSIVE METABOLIC PANEL
ALT: 7 U/L (ref 0–44)
AST: 19 U/L (ref 15–41)
Albumin: 3.2 g/dL — ABNORMAL LOW (ref 3.5–5.0)
Alkaline Phosphatase: 88 U/L (ref 38–126)
Anion gap: 9 (ref 5–15)
BUN: 68 mg/dL — ABNORMAL HIGH (ref 8–23)
CO2: 26 mmol/L (ref 22–32)
Calcium: 8.9 mg/dL (ref 8.9–10.3)
Chloride: 106 mmol/L (ref 98–111)
Creatinine, Ser: 2.2 mg/dL — ABNORMAL HIGH (ref 0.61–1.24)
GFR, Estimated: 28 mL/min — ABNORMAL LOW (ref >60)
Glucose, Bld: 135 mg/dL — ABNORMAL HIGH (ref 70–99)
Potassium: 3.7 mmol/L (ref 3.5–5.1)
Sodium: 141 mmol/L (ref 135–145)
Total Bilirubin: 0.3 mg/dL (ref 0.3–1.2)
Total Protein: 6.7 g/dL (ref 6.5–8.1)

## 2021-03-27 LAB — TSH: TSH: 3.661 u[IU]/mL (ref 0.350–4.500)

## 2021-03-27 LAB — HEMOGLOBIN A1C
Hgb A1c MFr Bld: 6.6 % — ABNORMAL HIGH (ref 4.8–5.6)
Mean Plasma Glucose: 143 mg/dL

## 2021-03-27 LAB — GLUCOSE, CAPILLARY
Glucose-Capillary: 102 mg/dL — ABNORMAL HIGH (ref 70–99)
Glucose-Capillary: 123 mg/dL — ABNORMAL HIGH (ref 70–99)
Glucose-Capillary: 155 mg/dL — ABNORMAL HIGH (ref 70–99)
Glucose-Capillary: 167 mg/dL — ABNORMAL HIGH (ref 70–99)
Glucose-Capillary: 170 mg/dL — ABNORMAL HIGH (ref 70–99)
Glucose-Capillary: 247 mg/dL — ABNORMAL HIGH (ref 70–99)

## 2021-03-27 LAB — MAGNESIUM: Magnesium: 2.6 mg/dL — ABNORMAL HIGH (ref 1.7–2.4)

## 2021-03-27 LAB — PHOSPHORUS: Phosphorus: 4.1 mg/dL (ref 2.5–4.6)

## 2021-03-27 LAB — LIPID PANEL
Cholesterol: 208 mg/dL — ABNORMAL HIGH (ref 0–200)
HDL: 48 mg/dL (ref >40)
LDL Cholesterol: 140 mg/dL — ABNORMAL HIGH (ref 0–99)
Total CHOL/HDL Ratio: 4.3 RATIO
Triglycerides: 98 mg/dL (ref <150)
VLDL: 20 mg/dL (ref 0–40)

## 2021-03-27 MED ORDER — DEXTROSE-NACL 5-0.9 % IV SOLN: INTRAVENOUS | Status: DC

## 2021-03-27 NOTE — Progress Notes (Signed)
?PROGRESS NOTE ? ? ? ?GERBER PENZA  KZS:[id number] DOB: [birth year] DOA: 03/25/2021 ?PCP: Patient, No Pcp Per (Inactive)  ? ? ? ?Brief Narrative:  ?88 y.o. WM PMHx allergy rhinitis, osteoarthritis, kyphosis, asthmatic bronchitis, CAD, type II DM, stage III CKD, history of DVT, hyperlipidemia, GERD, impaired hearing, motor lithiasis, skin cancer, hypertension, hypothyroidism, macular degeneration, paroxysmal atrial fibrillation, Parkinson's disease, history of unspecified stroke, prostate cancer, PAF, urgency frequency syndrome, urticaria who was just discharged yesterday from the hospital due to viral pneumonia and is being sent from his facility due to dyspnea and decreased oral intake.  EMS stated he was 74% on room air.  The patient is unable to elaborate further, but answer simple questions.  He denied headache, back, chest or abdominal pain. ? ?ED course: Initial vital signs were temperature 97.5 ?F, pulse 76, respirations 23, BP 163/88 mmHg O2 sat 93% on nasal cannula oxygen.  The patient received ceftriaxone and azithromycin IVPB. ?  ?Lab work: CBC showed a white count of 23.4 with 92% neutrophils, hemoglobin 12.0 g/dL platelets 337.  Venous blood gas was normal except for a decreased PO2 less than 31 mmHg.  Troponin was 22 ng/L, lactic acid was normal.  Coronavirus and influenza PCR negative.  CMP with normal electrolytes once calcium corrected to albumin.  Glucose 209, BUN 61 and creatinine 2.24 mg/dL.  Albumin was 3.4 g/dL, all other hepatic functions were normal. ?  ?Imaging: One-view portable chest radiograph show bibasilar airspace disease concerning for pneumonia. ? ? ?Subjective: ?3/16 afebrile overnight.  A/O x3 (patient does not know why), follows most commands.  RN Diamone very concerned that patient is aspirating, chokes every time he attempts to eat his meals. ? ? ?Assessment & Plan: ?Covid vaccination; ?  ?Principal Problem: ?  Acute respiratory failure with hypoxia ([place]) ?Active Problems: ?   Pneumonia ?  Type II diabetes mellitus with renal manifestations ([place]) ?  Benign essential HTN ?  Paroxysmal atrial fibrillation (HCC) ?  Parkinson's disease ([place]) ?  Coronary artery disease involving native coronary artery of native heart without angina pectoris ?  Hypothyroidism ?  Anemia of chronic disease ?  GERD (gastroesophageal reflux disease) ?  Hyperlipidemia LDL goal <70 ?  Physical deconditioning ?  Protein calorie malnutrition ([place]) ? ? ? Acute respiratory failure with hypoxia ([place]) ?-In the setting of viral versus bacterial Pneumonia ?-Continuous pulse ox monitoring ?- Titrate O2 to maintain SPO2> 92% ?-DuoNeb TID ?-Complete 5-day course antibiotics ?-Check sputum Gram stain. ?-Check strep pneumoniae urinary antigen. ? ?Aspiration? ?- 3/16 Speech consult has been observed to aspirate during feeding.  Evaluate for aspiration ?- "pt requires the [facility] to be elevated at least 30 degrees most of the time. and frequent/immediate body changes ? ?DM type II with renal manifestations ([place]) ?-3/15 hemoglobin A1c pending ?- 3/15 lipid panel pending ?-.  3/15 increase moderate SSI ? ?  ?Hyperlipidemia LDL goal <70 ?-Not on medical therapy at this time. ?-See DM type II ?  ?Benign Essential HTN ?-Currently hold all BP medication  ?  ?Paroxysmal atrial fibrillation (HCC) ?-3/15 currently NSR . ?-3/15 continue apixaban 2.5 mg p.o. twice daily. ? ?CAD involving native coronary artery of native heart without angina pectoris ?-See A-fib  ? ?Parkinson's disease ([place]) ?-Continue Sinemet p.o. 3 times daily. ? ?Dementia ?- Patient appears to have some underlying dementia. ?-3/15 Haldol PRN ?- 3/15 Seroquel 25 mg QHS ? ?Hypothyroidism ?-3/15 TSH pending ?-levothyroxine 150 mcg p.o. daily. ?  ?Anemia of chronic disease ?-Monitor hematocrit  hemoglobin. ?  ?GERD (gastroesophageal reflux disease) ?pantoprazole 40 mg p.o. daily. ? ?CKD Stage 3B (baseline Cr 2.04) ?Lab Results  ?Component Value Date  ? CREATININE 2.20 (H) 03/27/2021   ? CREATININE 2.33 (H) 03/26/2021  ? CREATININE 2.24 (H) 03/25/2021  ? CREATININE 2.09 (H) 03/24/2021  ? CREATININE 2.10 (H) 03/23/2021  ? ?Protein calorie malnutrition ?- 3/16 D5-0.9% saline at 34m/hr ? ?  ?Physical deconditioning ?Given overall state of health:Consider palliative care evaluation if no improvement. ? ?Work-up goals of care ?- 3/16 PT/OT consult:Acute respiratory failure with hypoxia, dementia, deconditioning evaluate for SNF. ?  ? ? ?DVT prophylaxis: Apixaban ?Code Status: DNR ?Family Communication:  ?Status is: Inpatient ? ? ? ?Dispo: The patient is from: SNF ?             Anticipated d/c is to: SNF ?             Anticipated d/c date is: 3 days ?             Patient currently is not medically stable to d/c. ? ? ? ? ? ?Consultants:  ? ? ?Procedures/Significant Events:  ? ? ?I have personally reviewed and interpreted all radiology studies and my findings are as above. ? ?VENTILATOR SETTINGS: ?Nasal cannula 3/15 ?Flow 3 L/min ?SPO2 93% ? ? ?Cultures ?3/14 blood pending ?3/14 influenza A/B negative ?3/14 SARS coronavirus negative ?3/15 respiratory virus panel pending ? ? ?Antimicrobials: ?Anti-infectives (From admission, onward)  ? ? Start     Dose/Rate Route Frequency Ordered Stop  ? 03/26/21 1100  cefTRIAXone (ROCEPHIN) 2 g in sodium chloride 0.9 % 100 mL IVPB       ? 2 g ?200 mL/hr over 30 Minutes Intravenous Every 24 hours 03/25/21 1306 03/31/21 1059  ? 03/26/21 1100  azithromycin (ZITHROMAX) 500 mg in sodium chloride 0.9 % 250 mL IVPB       ? 500 mg ?250 mL/hr over 60 Minutes Intravenous Every 24 hours 03/25/21 1306 03/31/21 1059  ? 03/25/21 1600  Uro-MP 118 MG CAPS 1 capsule  Status:  Discontinued       ? 1 capsule Oral 3 times daily 03/25/21 1305 03/25/21 1354  ? 03/25/21 1600  Urelle (URELLE/URISED) 81 MG tablet 81 mg       ? 1 tablet Oral 3 times daily 03/25/21 1354    ? 03/25/21 1000  cefTRIAXone (ROCEPHIN) 2 g in sodium chloride 0.9 % 100 mL IVPB       ? 2 g ?200 mL/hr over 30 Minutes  Intravenous  Once 03/25/21 0947 03/25/21 1134  ? 03/25/21 1000  azithromycin (ZITHROMAX) 500 mg in sodium chloride 0.9 % 250 mL IVPB       ? 500 mg ?250 mL/hr over 60 Minutes Intravenous  Once 03/25/21 0947 03/25/21 1208  ? ?  ?  ? ? ?Devices ?  ? ?LINES / TUBES:  ? ? ? ? ?Continuous Infusions: ? azithromycin 500 mg (03/27/21 1153)  ? cefTRIAXone (ROCEPHIN)  IV 2 g (03/27/21 1040)  ? ? ? ?Objective: ?Vitals:  ? 03/26/21 1207 03/27/21 0017 03/27/21 0549 03/27/21 0927  ?BP: (!) 141/76 132/73 (!) 147/84   ?Pulse: 72 71 72   ?Resp: '20 18 18   '$ ?Temp: 98.1 ?F (36.7 ?C) 97.6 ?F (36.4 ?C) 97.7 ?F (36.5 ?C)   ?TempSrc: Oral Axillary Axillary   ?SpO2: 96% 98% 98% 96%  ?Weight:      ?Height:      ? ? ?Intake/Output Summary (Last 24 hours)  at 03/27/2021 1410 ?Last data filed at 03/27/2021 0715 ?Gross per 24 hour  ?Intake 470 ml  ?Output 350 ml  ?Net 120 ml  ? ?Filed Weights  ? 03/25/21 0951  ?Weight: 68.9 kg  ? ? ?Examination: ? ?General:A/O x3 (patient does not know why), follows most commands. No acute respiratory distress ?Eyes: negative scleral hemorrhage, negative anisocoria, negative icterus ?ENT: Negative Runny nose, negative gingival bleeding, ?Neck:  Negative scars, masses, torticollis, lymphadenopathy, JVD ?Lungs: Clear to auscultation bilaterally without wheezes or crackles ?Cardiovascular: Regular rate and rhythm without murmur gallop or rub normal S1 and S2 ?Abdomen: negative abdominal pain, nondistended, positive soft, bowel sounds, no rebound, no ascites, no appreciable mass ?Extremities: No significant cyanosis, clubbing, or edema bilateral lower extremities ?Skin: Negative rashes, lesions, ulcers ?Psychiatric: Unable to evaluate secondary to patient's somnolence ?Central nervous system: Withdraws to painful stimuli, Unable to evaluate secondary to patient's somnolence ? ?.  ? ? ? ?Data Reviewed: Care during the described time interval was provided by me .  I have reviewed this patient's available data, including  medical history, events of note, physical examination, and all test results as part of my evaluation. ? ?CBC: ?Recent Labs  ?Lab 03/20/21 ?1716 03/21/21 ?0501 03/25/21 ?1046 03/26/21 ?2233 03/27/21 ?6122

## 2021-03-27 NOTE — TOC Progression Note (Addendum)
Transition of Care (TOC) - Progression Note  ? ? ?Patient Details  ?Name: [name] ?MRN: [id number] ?Date of Birth: 08/24/1931 ? ?Transition of Care (TOC) CM/SW Contact  ?Tawanna Cooler, RN ?Phone Number: ?03/27/2021, 2:00 PM ? ?Clinical Narrative:    ? ?Called [facility] and spoke with Gina DON.  Agrees that patient can return to ALF with [facility].  They cannot accept patient back over the weekend, must be discharged during the week, so probably will be Monday.   ?Bradd Canary at [facility] aware of the plan and possible d/c Monday.   ? ?Called patient's daughter, Lucita Ferrara, to let her know the current d/c plan.  She is agreeable for patient to return to ALF with [facility] once he's medically cleared.  Amy was asking about a hospital bed for patient.  CM explained that the physician can order it, but it won't be delivered before patient goes back to ALF.  Amy verbalized understanding, states she has no preference for a DME provider.   ? ?Addendum: Order was placed for a hospital bed.  Sent order to Bonita at [facility].  ? ? ?Expected Discharge Plan: Assisted Living ?Barriers to Discharge: Continued Medical Work up ? ?Expected Discharge Plan and Services ?Expected Discharge Plan: Assisted Living ?  ?  ? ?Readmission Risk Interventions ?Readmission Risk Prevention Plan 03/26/2021  ?Transportation Screening Complete  ?PCP or Specialist Appt within 3-5 Days Complete  ?[place] or Home Care Consult Complete  ?Social Work Consult for [place] Planning/Counseling Complete  ?Palliative Care Screening Complete  ?Medication Review Press photographer) Complete  ?Some recent data might be hidden  ? ? ?

## 2021-03-28 DIAGNOSIS — D638 Anemia in other chronic diseases classified elsewhere: Secondary | ICD-10-CM | POA: Diagnosis not present

## 2021-03-28 DIAGNOSIS — R4701 Aphasia: Secondary | ICD-10-CM

## 2021-03-28 DIAGNOSIS — I2510 Atherosclerotic heart disease of native coronary artery without angina pectoris: Secondary | ICD-10-CM | POA: Diagnosis not present

## 2021-03-28 DIAGNOSIS — J9601 Acute respiratory failure with hypoxia: Secondary | ICD-10-CM | POA: Diagnosis not present

## 2021-03-28 DIAGNOSIS — I10 Essential (primary) hypertension: Secondary | ICD-10-CM | POA: Diagnosis not present

## 2021-03-28 DIAGNOSIS — T17800A Unspecified foreign body in other parts of respiratory tract causing asphyxiation, initial encounter: Secondary | ICD-10-CM

## 2021-03-28 LAB — CBC WITH DIFFERENTIAL/PLATELET
Abs Immature Granulocytes: 0.22 10*3/uL — ABNORMAL HIGH (ref 0.00–0.07)
Basophils Absolute: 0 10*3/uL (ref 0.0–0.1)
Basophils Relative: 0 %
Eosinophils Absolute: 0 10*3/uL (ref 0.0–0.5)
Eosinophils Relative: 0 %
HCT: 26.7 % — ABNORMAL LOW (ref 39.0–52.0)
Hemoglobin: 8.4 g/dL — ABNORMAL LOW (ref 13.0–17.0)
Immature Granulocytes: 2 %
Lymphocytes Relative: 7 %
Lymphs Abs: 1 10*3/uL (ref 0.7–4.0)
MCH: 31.6 pg (ref 26.0–34.0)
MCHC: 31.5 g/dL (ref 30.0–36.0)
MCV: 100.4 fL — ABNORMAL HIGH (ref 80.0–100.0)
Monocytes Absolute: 0.8 10*3/uL (ref 0.1–1.0)
Monocytes Relative: 5 %
Neutro Abs: 13.1 10*3/uL — ABNORMAL HIGH (ref 1.7–7.7)
Neutrophils Relative %: 86 %
Platelets: 282 10*3/uL (ref 150–400)
RBC: 2.66 MIL/uL — ABNORMAL LOW (ref 4.22–5.81)
RDW: 12.6 % (ref 11.5–15.5)
WBC: 15.1 10*3/uL — ABNORMAL HIGH (ref 4.0–10.5)
nRBC: 0 % (ref 0.0–0.2)

## 2021-03-28 LAB — GLUCOSE, CAPILLARY
Glucose-Capillary: 123 mg/dL — ABNORMAL HIGH (ref 70–99)
Glucose-Capillary: 159 mg/dL — ABNORMAL HIGH (ref 70–99)
Glucose-Capillary: 170 mg/dL — ABNORMAL HIGH (ref 70–99)
Glucose-Capillary: 176 mg/dL — ABNORMAL HIGH (ref 70–99)
Glucose-Capillary: 197 mg/dL — ABNORMAL HIGH (ref 70–99)
Glucose-Capillary: 198 mg/dL — ABNORMAL HIGH (ref 70–99)
Glucose-Capillary: 200 mg/dL — ABNORMAL HIGH (ref 70–99)

## 2021-03-28 LAB — HEMOGLOBIN A1C
Hgb A1c MFr Bld: 6.9 % — ABNORMAL HIGH (ref 4.8–5.6)
Hgb A1c MFr Bld: 6.4 % — ABNORMAL HIGH (ref 4.8–5.6)
Mean Plasma Glucose: 151 mg/dL
Mean Plasma Glucose: 137 mg/dL

## 2021-03-28 LAB — COMPREHENSIVE METABOLIC PANEL
ALT: 7 U/L (ref 0–44)
AST: 16 U/L (ref 15–41)
Albumin: 2.6 g/dL — ABNORMAL LOW (ref 3.5–5.0)
Alkaline Phosphatase: 69 U/L (ref 38–126)
Anion gap: 9 (ref 5–15)
BUN: 63 mg/dL — ABNORMAL HIGH (ref 8–23)
CO2: 24 mmol/L (ref 22–32)
Calcium: 8.4 mg/dL — ABNORMAL LOW (ref 8.9–10.3)
Chloride: 110 mmol/L (ref 98–111)
Creatinine, Ser: 1.98 mg/dL — ABNORMAL HIGH (ref 0.61–1.24)
GFR, Estimated: 32 mL/min — ABNORMAL LOW (ref >60)
Glucose, Bld: 200 mg/dL — ABNORMAL HIGH (ref 70–99)
Potassium: 3.7 mmol/L (ref 3.5–5.1)
Sodium: 143 mmol/L (ref 135–145)
Total Bilirubin: 0.3 mg/dL (ref 0.3–1.2)
Total Protein: 5.5 g/dL — ABNORMAL LOW (ref 6.5–8.1)

## 2021-03-28 LAB — MAGNESIUM: Magnesium: 2.4 mg/dL (ref 1.7–2.4)

## 2021-03-28 LAB — PHOSPHORUS: Phosphorus: 3.2 mg/dL (ref 2.5–4.6)

## 2021-03-28 MED ORDER — ADULT MULTIVITAMIN LIQUID CH
15.0000 mL | Freq: Every day | ORAL | Status: DC
  Administered 2021-03-30 – 2021-04-02 (×4): 15 mL via ORAL
  Filled 2021-03-28 (×6): qty 15

## 2021-03-28 MED ORDER — ADULT MULTIVITAMIN W/MINERALS CH: 1.0000 | ORAL_TABLET | Freq: Every day | ORAL | Status: DC

## 2021-03-28 NOTE — Progress Notes (Signed)
Chaplain provided social support to [name].  He was happy that his sister had called earlier and is grateful that they help take care of him.  He shared about his children and about his wife who died 3 years ago.  He was in good spirits and very appreciative of the visit. ? ?[employer], Bcc ?Pager, [phone] ? ?

## 2021-03-28 NOTE — Progress Notes (Incomplete)
?PROGRESS NOTE ? ? ? ?[name]  ZOX:[id number] DOB: [birth year] DOA: 03/25/2021 ?PCP: Patient, No Pcp Per (Inactive)  ? ? ? ?Brief Narrative:  ?88 y.o. WM PMHx allergy rhinitis, osteoarthritis, kyphosis, asthmatic bronchitis, CAD, type II DM, stage III CKD, history of DVT, hyperlipidemia, GERD, impaired hearing, motor lithiasis, skin cancer, hypertension, hypothyroidism, macular degeneration, paroxysmal atrial fibrillation, Parkinson's disease, history of unspecified stroke, prostate cancer, PAF, urgency frequency syndrome, urticaria who was just discharged yesterday from the hospital due to viral pneumonia and is being sent from his facility due to dyspnea and decreased oral intake.  EMS stated he was 74% on room air.  The patient is unable to elaborate further, but answer simple questions.  He denied headache, back, chest or abdominal pain. ? ?ED course: Initial vital signs were temperature 97.5 ?F, pulse 76, respirations 23, BP 163/88 mmHg O2 sat 93% on nasal cannula oxygen.  The patient received ceftriaxone and azithromycin IVPB. ?  ?Lab work: CBC showed a white count of 23.4 with 92% neutrophils, hemoglobin 12.0 g/dL platelets 337.  Venous blood gas was normal except for a decreased PO2 less than 31 mmHg.  Troponin was 22 ng/L, lactic acid was normal.  Coronavirus and influenza PCR negative.  CMP with normal electrolytes once calcium corrected to albumin.  Glucose 209, BUN 61 and creatinine 2.24 mg/dL.  Albumin was 3.4 g/dL, all other hepatic functions were normal. ?  ?Imaging: One-view portable chest radiograph show bibasilar airspace disease concerning for pneumonia. ? ? ?Subjective: ?3/17 ? ? ?3/16 afebrile overnight.  A/O x3 (patient does not know why), follows most commands.  RN Diamone very concerned that patient is aspirating, chokes every time he attempts to eat his meals. ? ? ?Assessment & Plan: ?Covid vaccination; ?  ?Principal Problem: ?  Acute respiratory failure with hypoxia ([place]) ?Active  Problems: ?  Pneumonia ?  Type II diabetes mellitus with renal manifestations ([place]) ?  Benign essential HTN ?  Paroxysmal atrial fibrillation (HCC) ?  Parkinson's disease ([place]) ?  Coronary artery disease involving native coronary artery of native heart without angina pectoris ?  Hypothyroidism ?  Anemia of chronic disease ?  GERD (gastroesophageal reflux disease) ?  Hyperlipidemia LDL goal <70 ?  Physical deconditioning ?  Protein calorie malnutrition ([place]) ? ? ? Acute respiratory failure with hypoxia ([place]) ?-In the setting of viral versus bacterial Pneumonia ?-Continuous pulse ox monitoring ?- Titrate O2 to maintain SPO2> 92% ?-DuoNeb TID ?-Complete 5-day course antibiotics ?-Check sputum Gram stain. ?-Check strep pneumoniae urinary antigen. ? ?Aspiration? ?- 3/16 Speech consult has been observed to aspirate during feeding.  Evaluate for aspiration ?- "pt requires the [facility] to be elevated at least 30 degrees most of the time. and frequent/immediate body changes ? ?DM type II with renal manifestations ([place]) ?-3/15 hemoglobin A1c pending ?- 3/15 lipid panel pending ?-.  3/15 increase moderate SSI ? ?  ?Hyperlipidemia LDL goal <70 ?-Not on medical therapy at this time. ?-See DM type II ?  ?Benign Essential HTN ?-Currently hold all BP medication  ?  ?Paroxysmal atrial fibrillation (HCC) ?-3/15 currently NSR . ?-3/15 continue apixaban 2.5 mg p.o. twice daily. ? ?CAD involving native coronary artery of native heart without angina pectoris ?-See A-fib  ? ?Parkinson's disease ([place]) ?-Continue Sinemet p.o. 3 times daily. ? ?Dementia ?- Patient appears to have some underlying dementia. ?-3/15 Haldol PRN ?- 3/15 Seroquel 25 mg QHS ? ?Hypothyroidism ?-3/15 TSH pending ?-levothyroxine 150 mcg p.o. daily. ?  ?Anemia of chronic  disease ?-Monitor hematocrit hemoglobin. ?  ?GERD (gastroesophageal reflux disease) ?pantoprazole 40 mg p.o. daily. ?Is in my note is in my note ? ?Protein calorie malnutrition ?- 3/16 D5-0.9% saline at  26m/hr ? ?  ?Physical deconditioning ?Given overall state of health:Consider palliative care evaluation if no improvement. ? ?Work-up goals of care ?- 3/16 PT/OT consult:Acute respiratory failure with hypoxia, dementia, deconditioning evaluate for SNF. ?  ? ? ?DVT prophylaxis: Apixaban ?Code Status: DNR ?Family Communication:  ?Status is: Inpatient ? ? ? ?Dispo: The patient is from: SNF ?             Anticipated d/c is to: SNF ?             Anticipated d/c date is: 3 days ?             Patient currently is not medically stable to d/c. ? ? ? ? ? ?Consultants:  ? ? ?Procedures/Significant Events:  ? ? ?I have personally reviewed and interpreted all radiology studies and my findings are as above. ? ?VENTILATOR SETTINGS: ?Nasal cannula 3/15 ?Flow 3 L/min ?SPO2 93% ? ? ?Cultures ?3/14 blood pending ?3/14 influenza A/B negative ?3/14 SARS coronavirus negative ?3/15 respiratory virus panel pending ? ? ?Antimicrobials: ?Anti-infectives (From admission, onward)  ? ? Start     Dose/Rate Route Frequency Ordered Stop  ? 03/26/21 1100  cefTRIAXone (ROCEPHIN) 2 g in sodium chloride 0.9 % 100 mL IVPB       ? 2 g ?200 mL/hr over 30 Minutes Intravenous Every 24 hours 03/25/21 1306 03/31/21 1059  ? 03/26/21 1100  azithromycin (ZITHROMAX) 500 mg in sodium chloride 0.9 % 250 mL IVPB       ? 500 mg ?250 mL/hr over 60 Minutes Intravenous Every 24 hours 03/25/21 1306 03/31/21 1059  ? 03/25/21 1600  Uro-MP 118 MG CAPS 1 capsule  Status:  Discontinued       ? 1 capsule Oral 3 times daily 03/25/21 1305 03/25/21 1354  ? 03/25/21 1600  Urelle (URELLE/URISED) 81 MG tablet 81 mg       ? 1 tablet Oral 3 times daily 03/25/21 1354    ? 03/25/21 1000  cefTRIAXone (ROCEPHIN) 2 g in sodium chloride 0.9 % 100 mL IVPB       ? 2 g ?200 mL/hr over 30 Minutes Intravenous  Once 03/25/21 0947 03/25/21 1134  ? 03/25/21 1000  azithromycin (ZITHROMAX) 500 mg in sodium chloride 0.9 % 250 mL IVPB       ? 500 mg ?250 mL/hr over 60 Minutes Intravenous  Once  03/25/21 0947 03/25/21 1208  ? ?  ?  ? ? ?Devices ?  ? ?LINES / TUBES:  ? ? ? ? ?Continuous Infusions: ? azithromycin 500 mg (03/27/21 1153)  ? cefTRIAXone (ROCEPHIN)  IV 2 g (03/27/21 1040)  ? dextrose 5 % and 0.9% NaCl 75 mL/hr at 03/28/21 0157  ? ? ? ?Objective: ?Vitals:  ? 03/27/21 2112 03/27/21 2151 03/28/21 0703503/17/23 0934  ?BP:  125/70 (!) 155/86   ?Pulse:  78 81   ?Resp:  19 20   ?Temp:  98 ?F (36.7 ?C) 98.7 ?F (37.1 ?C)   ?TempSrc:  Oral Oral   ?SpO2: 98% 95% 98% 97%  ?Weight:      ?Height:      ? ? ?Intake/Output Summary (Last 24 hours) at 03/28/2021 1100 ?Last data filed at 03/28/2021 0517 ?Gross per 24 hour  ?Intake 2234.28 ml  ?Output 900 ml  ?Net 1334.28  ml  ? ? ?Filed Weights  ? 03/25/21 0951  ?Weight: 68.9 kg  ? ? ?Examination: ? ?General:A/O x3 (patient does not know why), follows most commands. No acute respiratory distress ?Eyes: negative scleral hemorrhage, negative anisocoria, negative icterus ?ENT: Negative Runny nose, negative gingival bleeding, ?Neck:  Negative scars, masses, torticollis, lymphadenopathy, JVD ?Lungs: Clear to auscultation bilaterally without wheezes or crackles ?Cardiovascular: Regular rate and rhythm without murmur gallop or rub normal S1 and S2 ?Abdomen: negative abdominal pain, nondistended, positive soft, bowel sounds, no rebound, no ascites, no appreciable mass ?Extremities: No significant cyanosis, clubbing, or edema bilateral lower extremities ?Skin: Negative rashes, lesions, ulcers ?Psychiatric: Unable to evaluate secondary to patient's somnolence ?Central nervous system: Withdraws to painful stimuli, Unable to evaluate secondary to patient's somnolence ? ?.  ? ? ? ?Data Reviewed: Care during the described time interval was provided by me .  I have reviewed this patient's available data, including medical history, events of note, physical examination, and all test results as part of my evaluation. ? ?CBC: ?Recent Labs  ?Lab 03/25/21 ?1046 03/26/21 ?1601  03/27/21 ?0932 03/28/21 ?3557  ?WBC 23.4* 20.9* 20.3* 15.1*  ?NEUTROABS 21.6*  --  17.9* 13.1*  ?HGB 12.0* 9.3* 10.0* 8.4*  ?HCT 36.8* 28.6* 31.0* 26.7*  ?MCV 97.9 99.0 99.4 100.4*  ?PLT 337 245 290 282  ? ? ?Basic Metabolic Pan

## 2021-03-28 NOTE — Care Management Important Message (Signed)
Important Message ? ?Patient Details IM Letter given to the Patient. ?Name: [name] ?MRN: [id number] ?Date of Birth: [birth year] ? ? ?Medicare Important Message Given:  Yes ? ? ? ? ?Kerin Salen ?03/28/2021, 12:52 PM ?

## 2021-03-28 NOTE — Progress Notes (Signed)
?PROGRESS NOTE ? ? ? ?[name]  ZCH:[id number] DOB: 04-25-1931 DOA: 03/25/2021 ?PCP: Patient, No Pcp Per (Inactive)  ? ? ? ?Brief Narrative:  ?88 y.o. WM PMHx allergy rhinitis, osteoarthritis, kyphosis, asthmatic bronchitis, CAD, type II DM, stage III CKD, history of DVT, hyperlipidemia, GERD, impaired hearing, motor lithiasis, skin cancer, hypertension, hypothyroidism, macular degeneration, paroxysmal atrial fibrillation, Parkinson's disease, history of unspecified stroke, prostate cancer, PAF, urgency frequency syndrome, urticaria who was just discharged yesterday from the hospital due to viral pneumonia and is being sent from his facility due to dyspnea and decreased oral intake.  EMS stated he was 74% on room air.  The patient is unable to elaborate further, but answer simple questions.  He denied headache, back, chest or abdominal pain. ? ?ED course: Initial vital signs were temperature 97.5 ?F, pulse 76, respirations 23, BP 163/88 mmHg O2 sat 93% on nasal cannula oxygen.  The patient received ceftriaxone and azithromycin IVPB. ?  ?Lab work: CBC showed a white count of 23.4 with 92% neutrophils, hemoglobin 12.0 g/dL platelets 337.  Venous blood gas was normal except for a decreased PO2 less than 31 mmHg.  Troponin was 22 ng/L, lactic acid was normal.  Coronavirus and influenza PCR negative.  CMP with normal electrolytes once calcium corrected to albumin.  Glucose 209, BUN 61 and creatinine 2.24 mg/dL.  Albumin was 3.4 g/dL, all other hepatic functions were normal. ?  ?Imaging: One-view portable chest radiograph show bibasilar airspace disease concerning for pneumonia. ? ? ?Subjective: ?3/17 A/O x3 (does not know where) but does know that he is in [place] and in [place].  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  ? ? ?Assessment & Plan: ?Covid vaccination; ?  ?Principal Problem: ?  Acute respiratory failure with hypoxia  ([place]) ?Active Problems: ?  Pneumonia ?  Type II diabetes mellitus with renal manifestations ([place]) ?  Benign essential HTN ?  Paroxysmal atrial fibrillation (HCC) ?  Parkinson's disease ([place]) ?  Coronary artery disease involving native coronary artery of native heart without angina pectoris ?  Hypothyroidism ?  Anemia of chronic disease ?  GERD (gastroesophageal reflux disease) ?  Hyperlipidemia LDL goal <70 ?  Physical deconditioning ?  Protein calorie malnutrition ([place]) ? ? ? Acute respiratory failure with hypoxia ([place]) ?-In the setting of viral versus bacterial Pneumonia ?-Continuous pulse ox monitoring ?- Titrate O2 to maintain SPO2> 92% ?-DuoNeb TID ?-Complete 5-day course antibiotics ?-Check sputum Gram stain. ?-Check strep pneumoniae urinary antigen. ? ?Aspiration into lower respiratory tract  ?- "pt requires the [facility] to be elevated at least 30 degrees most of the time. and frequent/immediate body changes ?-3/17 patient failed swallow evaluation: Placed on Dysphagia 2 nectar thick ? ? ?DM type II with renal manifestations ([place]) ?-3/16 hemoglobin A1c = 6.4  ?- 3/15 lipid panel pending ?-.  3/15 increase moderate SSI ? ?  ?Hyperlipidemia LDL goal <70 ?-3/16 LDL= 140 . ?- Patient allergic to statins, Ezetimibe therefore will not start medication ?  ?Benign Essential HTN ?-Currently hold all BP medication  ?  ?Paroxysmal atrial fibrillation (HCC) ?-3/15 currently NSR . ?-3/15 continue apixaban 2.5 mg p.o. twice daily. ? ?CAD involving native coronary artery of native heart without angina pectoris ?-See A-fib  ? ?Parkinson's disease ([place]) ?-Continue Sinemet p.o. 3 times daily. ? ?Dementia ?- Patient appears to have some underlying dementia. ?-3/15 Haldol PRN ?- 3/15 Seroquel 25 mg QHS ? ?  Hypothyroidism ?-3/16 TSH =3.66 ?-levothyroxine 150 mcg p.o. daily. ?  ?Anemia of chronic disease ?-Monitor hematocrit hemoglobin. ?Lab Results  ?Component Value Date  ? HGB 8.4 (L) 03/28/2021  ? HGB 10.0 (L) 03/27/2021  ? HGB 9.3  (L) 03/26/2021  ? HGB 12.0 (L) 03/25/2021  ? HGB 10.7 (L) 03/21/2021  ? ?  ?GERD (gastroesophageal reflux disease) ?pantoprazole 40 mg p.o. daily. ? ?CKD Stage 3B (baseline Cr 2.04) ?Lab Results  ?Component Value Date  ? CREATININE 1.98 (H) 03/28/2021  ? CREATININE 2.20 (H) 03/27/2021  ? CREATININE 2.33 (H) 03/26/2021  ? CREATININE 2.24 (H) 03/25/2021  ? CREATININE 2.09 (H) 03/24/2021  ?-3/17 baseline ? ?Protein calorie malnutrition severe ?- 3/16 D5-0.9% saline at 85m/hr ?-3/17 Placed on Dysphagia 2 nectar thick ? ?  ?Physical deconditioning ?Given overall state of health:Consider palliative care evaluation if no improvement. ? ? ? ?Work-up goals of care ?- 3/16 PT/OT consult:Acute respiratory failure with hypoxia, dementia, deconditioning evaluate for SNF. ?-3/17 Palliative Care Consult: Multisystem organ failure with frequent hospital visits.  [facility] ?  ? ? ?DVT prophylaxis: Apixaban ?Code Status: DNR ?Family Communication:  ?Status is: Inpatient ? ? ? ?Dispo: The patient is from: SNF ?             Anticipated d/c is to: SNF ?             Anticipated d/c date is: 3 days ?             Patient currently is not medically stable to d/c. ? ? ? ? ? ?Consultants:  ? ? ?Procedures/Significant Events:  ? ? ?I have personally reviewed and interpreted all radiology studies and my findings are as above. ? ?VENTILATOR SETTINGS: ?Nasal cannula 3/17 ?Flow 3 L/min ?SPO2 97% ? ? ?Cultures ?3/14 blood pending ?3/14 influenza A/B negative ?3/14 SARS coronavirus negative ?3/15 respiratory virus panel pending ? ? ?Antimicrobials: ?Anti-infectives (From admission, onward)  ? ? Start     Ordered Stop  ? 03/26/21 1100  cefTRIAXone (ROCEPHIN) 2 g in sodium chloride 0.9 % 100 mL IVPB       ? 03/25/21 1306 03/31/21 1059  ? 03/26/21 1100  azithromycin (ZITHROMAX) 500 mg in sodium chloride 0.9 % 250 mL IVPB       ? 03/25/21 1306 03/31/21 1059  ? 03/25/21 1600   Uro-MP 118 MG CAPS 1 capsule  Status:  Discontinued       ? 03/25/21 1305 03/25/21 1354  ? 03/25/21 1600  Urelle (URELLE/URISED) 81 MG tablet 81 mg       ? 03/25/21 1354    ? 03/25/21 1000  cefTRIAXone (ROCEPHIN) 2 g in sodium chloride 0.9 % 100 mL IVPB       ? 03/25/21 0947 03/25/21 1134  ? 03/25/21 1000  azithromycin (ZITHROMAX) 500 mg in sodium chloride 0.9 % 250 mL IVPB       ? 03/25/21 0947 03/25/21 1208  ? ?  ?  ? ? ?Devices ?  ? ?LINES / TUBES:  ? ? ? ? ?Continuous Infusions: ? azithromycin 500 mg (03/28/21 1211)  ? cefTRIAXone (ROCEPHIN)  IV 2 g (03/28/21 1115)  ? dextrose 5 % and 0.9% NaCl 75 mL/hr at 03/28/21 0157  ? ? ? ?Objective: ?Vitals:  ? 03/27/21 2112 03/27/21 2151 03/28/21 0099803/17/23 0934  ?BP:  125/70 (!) 155/86   ?Pulse:  78 81   ?Resp:  19 20   ?  Temp:  98 ?F (36.7 ?C) 98.7 ?F (37.1 ?C)   ?TempSrc:  Oral Oral   ?SpO2: 98% 95% 98% 97%  ?Weight:      ?Height:      ? ? ?Intake/Output Summary (Last 24 hours) at 03/28/2021 1331 ?Last data filed at 03/28/2021 0517 ?Gross per 24 hour  ?Intake 1994.28 ml  ?Output 900 ml  ?Net 1094.28 ml  ? ? ?Filed Weights  ? 03/25/21 0951  ?Weight: 68.9 kg  ? ? ?Examination: ? ?General:A/O x3 (does not know where), follows commands. No acute respiratory distress ?Eyes: negative scleral hemorrhage, negative anisocoria, negative icterus ?ENT: Negative Runny nose, negative gingival bleeding, ?Neck:  Negative scars, masses, torticollis, lymphadenopathy, JVD ?Lungs: Clear to auscultation bilaterally without wheezes or crackles ?Cardiovascular: Regular rate and rhythm without murmur gallop or rub normal S1 and S2 ?Abdomen: negative abdominal pain, nondistended, positive soft, bowel sounds, no rebound, no ascites, no appreciable mass ?Extremities: No significant cyanosis, clubbing, or edema bilateral lower extremities ?Skin: Negative rashes, lesions, ulcers ?Negative depression, negative anxiety, negative fatigue, negative mania  ?Central nervous system:  Cranial nerves II  through XII intact, tongue/uvula midline, all extremities muscle strength 5/5, sensation intact throughout, positive dysarthria, negative expressive aphasia, negative receptive aphasia. ? ?.  ? ? ? ?Data Revie

## 2021-03-28 NOTE — Evaluation (Signed)
Clinical/Bedside Swallow Evaluation Patient Details  Name: [name] MRN: [id number] Date of Birth: 04/20/1931  Today's Date: 03/28/2021 Time: SLP Start Time (ACUTE ONLY): 1449 SLP Stop Time (ACUTE ONLY): 1517 SLP Time Calculation (min) (ACUTE ONLY): 28 min  Past Medical History:  Past Medical History:  Diagnosis Date   Allergic rhinitis    Arthritis    arthritis-kyphosis   Asthmatic bronchitis    CAD in native artery    a. STEMI 07/2017 s/p DES to LCx (25% mRCA and 70% oOM1 residual).   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   Statin intolerance    Stroke (HCC)    Urgency-frequency syndrome    Urticaria    Past Surgical History:  Past Surgical History:  Procedure Laterality Date   BACK SURGERY     lumbar fracture   cataract surgery Bilateral    COLONOSCOPY W/ POLYPECTOMY     x2    CORONARY STENT INTERVENTION N/A 08/06/2017   Procedure: CORONARY STENT INTERVENTION;  Surgeon: William Crafts, MD;  Location: [facility];  Service: Cardiovascular;  Laterality: N/A;   CYSTOSCOPY W/ URETERAL STENT PLACEMENT Right 08/13/2017   Procedure: CYSTOSCOPY WITH RIGHT  RETROGRADE PYELOGRAM/URETERAL STENT PLACEMENT;  Surgeon: William Purpura, MD;  Location: [facility];  Service: Urology;  Laterality: Right;   CYSTOSCOPY WITH INJECTION N/A 09/11/2013   Procedure: CYSTOSCOPY WITH BOTOX INJECTION  INTO THE BLADDER;  Surgeon: William Ludwig, MD;  Location: [facility];  Service: Urology;  Laterality: N/A;   CYSTOSCOPY WITH RETROGRADE PYELOGRAM, URETEROSCOPY AND STENT PLACEMENT     CYSTOSCOPY WITH RETROGRADE PYELOGRAM, URETEROSCOPY AND STENT PLACEMENT Right 12/22/2018   Procedure: CYSTOSCOPY WITH RETROGRADE PYELOGRAM, URETEROSCOPY AND STENT PLACEMENT;  Surgeon: William Gauze, MD;  Location: [facility];  Service: Urology;  Laterality: Right;  90 MINS   CYSTOSCOPY WITH STENT PLACEMENT Right 01/26/2016   Procedure: CYSTOSCOPY, RIGHT RETROGRADE WITH RIGHT URETERAL STENT PLACEMENT;  Surgeon: William Gauze, MD;  Location: [facility];  Service: Urology;  Laterality: Right;   CYSTOSCOPY/RETROGRADE/URETEROSCOPY Right 04/22/2016   Procedure: CYSTOSCOPY/RETROGRADE/ REMOVAL JJ STENT right insertion stent right insertion of foley;  Surgeon: William Matar, MD;  Location: [facility];  Service: Urology;  Laterality: Right;   EXTRACORPOREAL SHOCK WAVE LITHOTRIPSY Right 02/27/2016   Procedure: RIGHT EXTRACORPOREAL SHOCK WAVE LITHOTRIPSY (ESWL) GAITED;  Surgeon: William Gauze, MD;  Location: [facility];  Service: Urology;  Laterality: Right;   EXTRACORPOREAL SHOCK WAVE LITHOTRIPSY Right 02/10/2016   Procedure: EXTRACORPOREAL SHOCK WAVE LITHOTRIPSY (ESWL);  Surgeon: William Gully, MD;  Location: [facility];  Service: Urology;  Laterality: Right;  [facility] NFAO-[phone] [name] BCBS- [id number]    EYE SURGERY     macular degeneration   GALLBLADDER SURGERY  2006   no cholecystectomy   HERNIA REPAIR  HOLMIUM LASER APPLICATION Right 12/22/2018   Procedure: HOLMIUM LASER APPLICATION;  Surgeon: William Gauze, MD;  Location: [facility];  Service: Urology;  Laterality: Right;   LEFT HEART CATH AND CORONARY ANGIOGRAPHY N/A 08/06/2017   Procedure: LEFT HEART CATH AND CORONARY ANGIOGRAPHY;  Surgeon: William Crafts, MD;  Location[facility] IN[facility] CV LAB;  Service: Cardiovascular;  Laterality:  N/A;   LUNG REMOVAL, PARTIAL  age 39   for bronchiectasis   PARTIAL THYMECTOMY  1985   for nodules   POPLITEAL SYNOVIAL CYST EXCISION  2005   TOTAL KNEE ARTHROPLASTY  2008   right   TOTAL KNEE ARTHROPLASTY  2010   left   HPI:  Pt is an 88yo male returning to hospital 3/14 for hypoxia and difficulty breathing after recent admission 3/9-3/13 for pneumonia.  CXR 3/14: Bibasilar airspace disease concerning for pneumonia. Pt is resident of [facility], with hx of CVA, prosCa s/p radX, Parkinsons disease, CAD s/p STEMI 2019 Jul, hypothyroidism, HTN, DM, and Afib, GERD. Hx of dysphagia with two prior MBS studies, 07/20/16 and 04/25/19. The latter revealed "sensorimotor deficits marked by bolus incohesion, spillage of thin and nectar liquids to the pyriforms before the pharyngeal swallow is triggered, and aspiration of thin liquids during and after the swallow.  Barium fills the pyrforms and then tends to spill into the larynx.  Laryngeal vestibule closure is inconsistent.  Thin liquid aspiration elicited an intermittent cough response, but often aspiration was silent.  A chin tuck did not help to protect the airway.  Nectar liquids were not aspirated during this exam. Primary deficits appeared to be decreased pharyngeal squeeze and impaired timing of laryngeal vestibule closure."    Assessment / Plan / Recommendation  Clinical Impression  Pt presents with concerns for aspiration during PO intake with consistent coughing observed after consumption of thin liquids.  Limiting bolus size did not minimize coughing.  [name] consumed trials of nectar liquids with few episodes of coughing; when cued to take small/individual sips, coughing was eliminated.  Regular solids posed greater difficulty with mastication. Purees were tolerated without incident.    Given recurring admissions for pna and hx of dysphagia and aspiration, recommend changing diet to dysphagia 2, nectar thick liquids for now to minimize  potential aspiration (swallow precautions posted at [facility]).  Pt/family may benefit from palliative care consult to address dysphagia and GOC in light of progressive dysphagia (compared to results of MBS APril 2021). D/W RN. SLP will follow while pt is admitted.  SLP Visit Diagnosis: Dysphagia, oropharyngeal phase (R13.12)    Aspiration Risk    moderate   Diet Recommendation   Dysphagia 2, nectar thick liquids  Medication Administration: Crushed with puree    Other  Recommendations Oral Care Recommendations: Oral care BID Other Recommendations: Order thickener from pharmacy    Recommendations for follow up therapy are one component of a multi-disciplinary discharge planning process, led by the attending physician.  Recommendations may be updated based on patient status, additional functional criteria and insurance authorization.  Follow up Recommendations Other (comment) (tba)      Assistance Recommended at Discharge Frequent or constant Supervision/Assistance  Functional Status Assessment    Frequency and Duration min 2x/week  1 week       Prognosis Prognosis for Safe Diet Advancement: Fair      Swallow Study   General HPI: Pt is an 88yo male returning to hospital 3/14 for hypoxia and difficulty breathing after recent admission 3/9-3/13 for pneumonia.  CXR  3/14: Bibasilar airspace disease concerning for pneumonia. Pt is resident of [facility], with hx of CVA, prosCa s/p radX, Parkinsons disease, CAD s/p STEMI 2019 Jul, hypothyroidism, HTN, DM, and Afib, GERD. Hx of dysphagia with two prior MBS studies, 07/20/16 and 04/25/19. The latter revealed "sensorimotor deficits marked by bolus incohesion, spillage of thin and nectar liquids to the pyriforms before the pharyngeal swallow is triggered, and aspiration of thin liquids during and after the swallow.  Barium fills the pyrforms and then tends to spill into the larynx.  Laryngeal vestibule closure is inconsistent.  Thin liquid  aspiration elicited an intermittent cough response, but often aspiration was silent.  A chin tuck did not help to protect the airway.  Nectar liquids were not aspirated during this exam. Primary deficits appeared to be decreased pharyngeal squeeze and impaired timing of laryngeal vestibule closure." Type of Study: Bedside Swallow Evaluation Previous Swallow Assessment: see HOPI Diet Prior to this Study: Regular;Thin liquids Temperature Spikes Noted: No Respiratory Status: Nasal cannula History of Recent Intubation: No Behavior/Cognition: Alert;Cooperative;Confused Oral Cavity Assessment: Within Functional Limits;Dried secretions Oral Care Completed by SLP: Recent completion by staff Oral Cavity - Dentition: Adequate natural dentition Vision: Functional for self-feeding Self-Feeding Abilities: Able to feed self;Needs set up Patient Positioning: Upright in bed Baseline Vocal Quality: Normal Volitional Cough: Strong Volitional Swallow: Able to elicit    Oral/Motor/Sensory Function Overall Oral Motor/Sensory Function: Within functional limits   Ice Chips Ice chips: Not tested   Thin Liquid Thin Liquid: Impaired Presentation: Cup;Straw Pharyngeal  Phase Impairments: Cough - Immediate    Nectar Thick Nectar Thick Liquid: Impaired Presentation: Cup;Straw Pharyngeal Phase Impairments: Cough - Delayed (if consuming large, consecutive sips)   Honey Thick Honey Thick Liquid: Not tested   Puree Puree: Within functional limits   Solid     Solid: Impaired Oral Phase Functional Implications: Prolonged oral transit      Blenda Mounts Laurice 03/28/2021,3:18 PM Marchelle Folks L. Samson Frederic, MA CCC/SLP Acute Rehabilitation Services Office number [phone] Pager [phone]

## 2021-03-28 NOTE — Progress Notes (Signed)
Physical Therapy Evaluation ?Patient Details ?Name: [name] ?MRN: [id number] ?DOB: Sep 17, 1931 ?Today's Date: 03/28/2021 ? ?History of Present Illness ? Pt is a 88yo male returning to hospital 3/14 for hypoxia and difficulty breathing after recent admission 3/9-3/13 for pneumonia.  PMH: Dementia, Parkinson's Disease, DM, HTN, PAF, GERD, hyperlipidemia, CVA, CKD 3, hypertension.  ?Clinical Impression ?  ?Pt presents secondary to the problems above with the functional impairments listed below. Pt seated in recliner at entry and agreeable to therapy; pt was able to state his name and that he was in the hospital. Pt required mod-max assist +2 for transfers and max assist for LE advancement into bed at end of session. Pt was able to perform some EOB functional activities such as forward/backward stepping and sidestepping, as well as sit to stands with mod assist +2 and multimodal cuing for sequencing. Recommending SNF-level therapies upon discharge to address pt's functional limitations. We will continue to follow the pt acutely to promote independence with functional mobility.   ?   ? ?Recommendations for follow up therapy are one component of a multi-disciplinary discharge planning process, led by the attending physician.  Recommendations may be updated based on patient status, additional functional criteria and insurance authorization. ? ?Follow Up Recommendations Skilled nursing-short term rehab (<3 hours/day) ? ?  ?Assistance Recommended at Discharge Frequent or constant Supervision/Assistance  ?Patient can return home with the following ? A lot of help with bathing/dressing/bathroom;Assistance with cooking/housework;Direct supervision/assist for financial management;Direct supervision/assist for medications management;Help with stairs or ramp for entrance;Assist for transportation;Assistance with feeding;A lot of help with walking and/or transfers ? ?  ?Equipment Recommendations None recommended by PT   ?Recommendations for Other Services ?    ?  ?Functional Status Assessment Patient has had a recent decline in their functional status and demonstrates the ability to make significant improvements in function in a reasonable and predictable amount of time.  ? ?  ?Precautions / Restrictions Precautions ?Precautions: Fall ?Precaution Comments: HOB elevated >30deg ?Restrictions ?Weight Bearing Restrictions: No  ? ?  ? ?Mobility ? Bed Mobility ?Overal bed mobility: Needs Assistance ?  ?  ?  ?  ?Sit to supine: Max assist, Mod assist ?  ?General bed mobility comments: Pt able to initiate trunk lowering onto bed but required max assist bring LEs into the bed. ?  ? ?Transfers ?Overall transfer level: Needs assistance ?Equipment used: 2 person hand held assist ?Transfers: Sit to/from Stand ?Sit to Stand: Max assist, Mod assist, +2 physical assistance ?  ?Step pivot transfers: +2 physical assistance, Mod assist ?  ?  ?  ?General transfer comment: Pt completed STS transfer from recliner with mod-max assist +2 for lift assist, cuing for initiating forward lean and powering up through legs. Upon standing, pt demonstrated posterior bias of hips that he was not able to self-correct. During step pivot transfer, pt required mod assist +2 for steadying and maintaining balance, pt able to take very short steps with PTs blocking knees. During stand sit transfer pt able to initiate sitting but required assist to lower down to bed. ?  ? ?Ambulation/Gait ?  ?  ?  ?  ?  ?  ?  ?  ? ?Stairs ?  ?  ?  ?  ?  ? ?Wheelchair Mobility ?  ? ?Modified Rankin (Stroke Patients Only) ?  ? ?  ? ?Balance Overall balance assessment: Needs assistance ?Sitting-balance support: Feet supported, Bilateral upper extremity supported ?Sitting balance-Leahy Scale: Poor ?Sitting balance - Comments: Pt presented  with posterior slump and with cuing could bring hips forward in the recliner, but required feet on the floor and BUE support to maintain balance. ?Postural  control: Posterior lean ?Standing balance support: Bilateral upper extremity supported, During functional activity ?Standing balance-Leahy Scale: Poor ?Standing balance comment: Pt reliant on external support to maintain static stance as well as during functional activities ?  ?  ?  ?  ?  ?  ?  ?  ?  ?  ?  ?   ? ? ? ?Pertinent Vitals/Pain Pain Assessment ?Pain Assessment: Faces ?Faces Pain Scale: Hurts a little bit ?Pain Location: Left hip ?Pain Descriptors / Indicators: Discomfort, Sore ?Pain Intervention(s): Repositioned, Limited activity within patient's tolerance  ? ? ?Home Living Family/patient expects to be discharged to:: Assisted living ?  ?  ?  ?  ?  ?  ?  ?  ?Home Equipment: Conservation officer, nature (2 wheels) ?Additional Comments: ALF @  [facility]  ?  ?Prior Function Prior Level of Function : Patient poor historian/Family not available ?  ?  ?  ?  ?  ?  ?Mobility Comments: Difficult to determine secondary to cognitive status. ?  ?  ? ? ?Hand Dominance  ?   ? ?  ?Extremity/Trunk Assessment  ? Upper Extremity Assessment ?Upper Extremity Assessment: Defer to OT evaluation ?  ? ?Lower Extremity Assessment ?Lower Extremity Assessment: Generalized weakness ?  ? ?Cervical / Trunk Assessment ?Cervical / Trunk Assessment: Kyphotic  ?Communication  ? Communication: HOH  ?Cognition Arousal/Alertness: Awake/alert ?Behavior During Therapy: [facility] for tasks assessed/performed ?Overall Cognitive Status: No family/caregiver present to determine baseline cognitive functioning ?  ?  ?  ?  ?  ?  ?  ?  ?  ?  ?  ?  ?  ?  ?  ?  ?General Comments: Pt has history of dementia. Responsive to simple questions, able to follow one-step commands with multimodal cuing. ?  ?  ? ?  ?General Comments General comments (skin integrity, edema, etc.): Pt on 3LO2 via [place], monitored throughout, VSS. ? ?  ?Exercises Other Exercises ?Other Exercises: Sit to stand x4: pt required VCs and mod-max assist +2 for lift assist and blocking feet to prevent  sliding ?Other Exercises: Steps EOB: forward & backward, sidesteps. x2. Pt required multimodal cuing for directional assist but was able to complete steps.  ? ?Assessment/Plan  ?  ?PT Assessment Patient needs continued PT services  ?PT Problem List Decreased strength;Decreased activity tolerance;Decreased mobility;Decreased range of motion;Decreased balance;Decreased coordination;Decreased cognition;Decreased safety awareness ? ?   ?  ?PT Treatment Interventions DME instruction;Therapeutic exercise;Gait training;Balance training;Functional mobility training;Therapeutic activities;Patient/family education;Neuromuscular re-education;Cognitive remediation   ? ?PT Goals (Current goals can be found in the Care Plan section)  ?Acute Rehab PT Goals ?PT Goal Formulation: Patient unable to participate in goal setting ?Time For Goal Achievement: 04/11/21 ?Potential to Achieve Goals: Fair ? ?  ?Frequency Min 2X/week ?  ? ? ?Co-evaluation   ?  ?  ?  ?  ? ? ?  ?AM-PAC PT "6 Clicks" Mobility  ?Outcome Measure Help needed turning from your back to your side while in a flat bed without using bedrails?: A Lot ?Help needed moving from lying on your back to sitting on the side of a flat bed without using bedrails?: A Lot ?Help needed moving to and from a bed to a chair (including a wheelchair)?: A Lot ?Help needed standing up from a chair using your arms (e.g., wheelchair or bedside chair)?:  A Lot ?Help needed to walk in hospital room?: Total ?Help needed climbing 3-5 steps with a railing? : Total ?6 Click Score: 10 ? ?  ?End of Session Equipment Utilized During Treatment: Gait belt ?Activity Tolerance: Patient tolerated treatment well;Patient limited by fatigue ?Patient left: in bed;with bed alarm set;with call bell/phone within reach ?Nurse Communication: Mobility status ?PT Visit Diagnosis: Other abnormalities of gait and mobility (R26.89);Muscle weakness (generalized) (M62.81);Other symptoms and signs involving the nervous system  (R29.898) ?  ? ?Time: 1024-1050 ?PT Time Calculation (min) (ACUTE ONLY): 26 min ? ? ?Charges:   PT Evaluation ?$PT Eval Low Complexity: 1 Low ?PT Treatments ?$Therapeutic Activity: 8-22 mins ?  ?   ? ? ?Seabron Spates

## 2021-03-28 NOTE — Evaluation (Signed)
Occupational Therapy Evaluation ?Patient Details ?Name: [name] ?MRN: [id number] ?DOB: 14-Apr-1931 ?Today's Date: 03/28/2021 ? ? ?History of Present Illness Pt is a 88yo male returning to hospital 3/14 for hypoxia and difficulty breathing after recent admission 3/9-3/13 for pneumonia.  PMH: Dementia, Parkinson's Disease, DM, HTN, PAF, GERD, hyperlipidemia, CVA, CKD 3, hypertension.  ? ?Clinical Impression ?  ?Patient with hx of dementia therefore unsure of PLOF info. Per patient he was ambulatory with a walker, used a scooter for community distance mobility. Was able to perform basic self care tasks but has assist from staff for bathing. Reports going to dining room for meals. Currently patient needing max A for bed mobility and stand pivot transfer to recliner with rolling walker. Posterior bias noted in sitting and standing with poor eccentric control into chair. Recommend continued acute OT services to maximize patient safety and independence with self care in order to facilitate D/C to venue listed below.  ?   ? ?Recommendations for follow up therapy are one component of a multi-disciplinary discharge planning process, led by the attending physician.  Recommendations may be updated based on patient status, additional functional criteria and insurance authorization.  ? ?Follow Up Recommendations ? Skilled nursing-short term rehab (<3 hours/day)  ?  ?Assistance Recommended at Discharge Frequent or constant Supervision/Assistance  ?Patient can return home with the following A lot of help with walking and/or transfers;A lot of help with bathing/dressing/bathroom;Assistance with cooking/housework;Direct supervision/assist for medications management;Direct supervision/assist for financial management;Assist for transportation;Help with stairs or ramp for entrance ? ?  ?Functional Status Assessment ? Patient has had a recent decline in their functional status and demonstrates the ability to make significant  improvements in function in a reasonable and predictable amount of time.  ?Equipment Recommendations ? None recommended by OT  ?  ?   ?Precautions / Restrictions Precautions ?Precautions: Fall ?Precaution Comments: HOB elevated >30deg ?Restrictions ?Weight Bearing Restrictions: No  ? ?  ? ?Mobility Bed Mobility ?Overal bed mobility: Needs Assistance ?Bed Mobility: Supine to Sit ?  ?  ?Supine to sit: Max assist ?  ?  ?General bed mobility comments: Max A for trunk support to sit upright ?  ? ? ? ?  ?Balance Overall balance assessment: Needs assistance ?Sitting-balance support: Feet supported ?Sitting balance-Leahy Scale: Poor ?  ?Postural control: Posterior lean ?Standing balance support: Reliant on assistive device for balance, During functional activity ?Standing balance-Leahy Scale: Zero ?Standing balance comment: max A and support of walker needed ?  ?  ?  ?  ?  ?  ?  ?  ?  ?  ?  ?   ? ?ADL either performed or assessed with clinical judgement  ? ?ADL Overall ADL's : Needs assistance/impaired ?Eating/Feeding: Set up;Sitting ?Eating/Feeding Details (indicate cue type and reason): Patient able to hold onto cup to drink water with straw ?Grooming: Minimal assistance;Wash/dry face;Bed level ?Grooming Details (indicate cue type and reason): Patient needing cues and min A to thoroughly wash his face ?Upper Body Bathing: Moderate assistance;Sitting ?  ?Lower Body Bathing: Total assistance;Sitting/lateral leans ?  ?Upper Body Dressing : Moderate assistance;Sitting ?  ?Lower Body Dressing: Total assistance;Sit to/from stand;Sitting/lateral leans ?  ?Toilet Transfer: Maximal assistance;Stand-pivot;Cueing for safety;Cueing for sequencing;Rolling walker (2 wheels) ?Toilet Transfer Details (indicate cue type and reason): Patient needing max A to power up to standing from elevated bed height and take small shuffled steps to recliner. Poor eccentric control with sitting ?Toileting- Clothing Manipulation and Hygiene: Total  assistance;Sitting/lateral lean;Sit to/from stand ?  ?  ?  ?  Functional mobility during ADLs: Maximal assistance;Rolling walker (2 wheels);Cueing for sequencing;Cueing for safety ?   ? ? ? ? ?Pertinent Vitals/Pain Pain Assessment ?Pain Assessment: Faces ?Faces Pain Scale: Hurts little more ?Pain Location: Hips ?Pain Descriptors / Indicators: Discomfort, Sore ?Pain Intervention(s): Patient requesting pain meds-RN notified  ? ? ? ?Hand Dominance Right ?  ?Extremity/Trunk Assessment Upper Extremity Assessment ?Upper Extremity Assessment: RUE deficits/detail;LUE deficits/detail ?RUE Deficits / Details: Limited shoulder flexion ~80 degrees, global weakness ?LUE Deficits / Details: AROM grossly intact, global weakness ?  ?Lower Extremity Assessment ?Lower Extremity Assessment: Defer to PT evaluation ?  ?Cervical / Trunk Assessment ?Cervical / Trunk Assessment: Kyphotic ?  ?Communication Communication ?Communication: HOH ?  ?Cognition Arousal/Alertness: Awake/alert ?Behavior During Therapy: [facility] for tasks assessed/performed ?Overall Cognitive Status: No family/caregiver present to determine baseline cognitive functioning ?  ?  ?  ?  ?  ?  ?  ?  ?  ?  ?  ?  ?  ?  ?  ?  ?General Comments: Patient with history of dementia. Following 1 step directions and inconsistent with answering orientation questions ?  ?  ?General Comments  VSS on 3L ? ?  ?   ?   ? ? ?Home Living Family/patient expects to be discharged to:: Assisted living ?  ?  ?  ?  ?  ?  ?  ?  ?  ?  ?  ?  ?  ?  ?Home Equipment: Conservation officer, nature (2 wheels) ?  ?Additional Comments: ALF @  [facility] ?  ? ?  ?Prior Functioning/Environment Prior Level of Function : Patient poor historian/Family not available ?  ?  ?  ?  ?  ?  ?Mobility Comments: Difficult to determine secondary to cognitive status. ?ADLs Comments: Per patient needing assist with bathing, would need to confirm with ALF staff ?  ? ?  ?  ?OT Problem List: Decreased strength;Decreased activity tolerance;Impaired  balance (sitting and/or standing);Decreased safety awareness;Pain ?  ?   ?OT Treatment/Interventions: Self-care/ADL training;Patient/family education;Balance training;Therapeutic activities;DME and/or AE instruction  ?  ?OT Goals(Current goals can be found in the care plan section) Acute Rehab OT Goals ?Patient Stated Goal: "I'm thirsty" ?OT Goal Formulation: With patient ?Time For Goal Achievement: 04/11/21 ?Potential to Achieve Goals: Good  ?OT Frequency: Min 2X/week ?  ? ?   ?AM-PAC OT "6 Clicks" Daily Activity     ?Outcome Measure Help from another person eating meals?: A Little ?Help from another person taking care of personal grooming?: A Little ?Help from another person toileting, which includes using toliet, bedpan, or urinal?: A Lot ?Help from another person bathing (including washing, rinsing, drying)?: A Lot ?Help from another person to put on and taking off regular upper body clothing?: A Lot ?Help from another person to put on and taking off regular lower body clothing?: Total ?6 Click Score: 13 ?  ?End of Session Equipment Utilized During Treatment: Oxygen;Rolling walker (2 wheels);Gait belt ?Nurse Communication: Mobility status;Patient requests pain meds ? ?Activity Tolerance: Patient tolerated treatment well ?Patient left: in chair;with call bell/phone within reach;with chair alarm set (mits applied) ? ?OT Visit Diagnosis: Unsteadiness on feet (R26.81);Other abnormalities of gait and mobility (R26.89);Muscle weakness (generalized) (M62.81)  ?              ?Time: 9767-3419 ?OT Time Calculation (min): 26 min ?Charges:  OT General Charges ?$OT Visit: 1 Visit ?OT Evaluation ?$OT Eval Low Complexity: 1 Low ?OT Treatments ?$Self Care/Home Management : 8-22 mins ? ?  Delbert Phenix OT ?OT pager: [phone] ? ?Rosemary Holms ?03/28/2021, 2:01 PM ?

## 2021-03-28 NOTE — Progress Notes (Signed)
Initial Nutrition Assessment ? ?DOCUMENTATION CODES:  ? ?Severe malnutrition in context of chronic illness ? ?INTERVENTION:  ?- will order Hormel Shakes TID with meals, each supplement provides 500 kcal and 22 grams protein. ?- will order Magic Cup BID with meals, each supplement provides 290 kcal and 9 grams of protein. ?- will order 1 tablet multivitamin with minerals/day.  ? ? ?NUTRITION DIAGNOSIS:  ? ?Severe Malnutrition related to chronic illness as evidenced by severe fat depletion, severe muscle depletion. ? ?GOAL:  ? ?Patient will meet greater than or equal to 90% of their needs ? ?MONITOR:  ? ?PO intake, Supplement acceptance, Labs, Weight trends ? ?REASON FOR ASSESSMENT:  ? ?Malnutrition Screening Tool ? ?ASSESSMENT:  ? ?88 y.o. male with medical history of allergy rhinitis, osteoarthritis, kyphosis, asthmatic bronchitis, CAD, type 2 DM, stage 3 CKD, DVT, HLD, GERD, impaired hearing, motor lithiasis, skin cancer, HTN, hypothyroidism, macular degeneration, afib, Parkinson's disease, history of unspecified stroke, prostate cancer, PAF, urgency frequency syndrome, and urticaria. He was discharged the day prior from the hospital; that admission was for viral PNA. He returned to the ED from facility due to dyspnea (74% on room air) and decreased oral intake. He was admitted with acute respiratory failure with hypoxia. ? ?Patient laying in bed with no visitors present at the time of RD visit. Patient with bilateral mitten restraints in place. Lunch tray at bedside. Patient indicates that he would like to eat. He is noted to be a/o to self only and unable to provide any additional information. ? ?Able to talk with RN who reported plan for swallow evaluation. SLP has now completed swallow eval and patient's diet was changed to Dysphagia 2, nectar-thick liquids. ? ?Weight on 3/24 was 152 lb and weight on 09/12/20 was 158 lb. This indicates 6 lb weight loss (3.8% body weight); not significant for time frame.   ? ? ?Labs reviewed; CBGs: 200, 198, 159, 123 mg/dl, BUN: 63 mg/dl, creatinine: 1.98 mg/dl, Ca: 8.4 mg/dl, GFR: 32 ml/min. ? ?Medications reviewed; 325 mg ferrous sulfate BID, sliding scale novolog, 150 mcg oral synthroid/day, 3 mg melatonin/night, 40 mg oral protonix BID, 40 mg deltasone/day (3/14-3/17). ? ?IVF; D5-NS @ 75 ml/hr (306 kcal/24 hrs).  ?  ? ?NUTRITION - FOCUSED PHYSICAL EXAM: ? ?Flowsheet Row Most Recent Value  ?Orbital Region Moderate depletion  ?Upper Arm Region Severe depletion  ?Thoracic and Lumbar Region Severe depletion  ?Buccal Region Moderate depletion  ?Temple Region Moderate depletion  ?Clavicle Bone Region Severe depletion  ?Clavicle and Acromion Bone Region Severe depletion  ?Scapular Bone Region Unable to assess  ?Dorsal Hand Unable to assess  [mittens]  ?Patellar Region Severe depletion  ?Anterior Thigh Region Severe depletion  ?Posterior Calf Region Severe depletion  ?Edema (RD Assessment) Mild  [BLE]  ?Hair Reviewed  ?Eyes Reviewed  ?Mouth Reviewed  ?Skin Reviewed  ?Nails Unable to assess  [mittens]  ? ?  ? ? ?Diet Order:   ?Diet Order   ? ?       ?  DIET DYS 2 Room service appropriate? Yes with Assist; Fluid consistency: Nectar Thick  Diet effective now       ?  ? ?  ?  ? ?  ? ? ?EDUCATION NEEDS:  ? ?Not appropriate for education at this time ? ?Skin:  Skin Assessment: Reviewed RN Assessment ? ?Last BM:  PTA/unknown ? ?Height:  ? ?Ht Readings from Last 1 Encounters:  ?03/25/21 '5\' 11"'$  (1.803 m)  ? ? ?Weight:  ? ?Wt  Readings from Last 1 Encounters:  ?03/25/21 68.9 kg  ? ? ? ?BMI:  Body mass index is 21.2 kg/m?. ? ?Estimated Nutritional Needs:  ?Kcal:  1800-2000 kcal ?Protein:  90-100 grams ?Fluid:  >/= 2 L/day ? ? ? ? ?Jarome Matin, MS, RD, LDN ?Registered Dietitian II ?Inpatient Clinical Nutrition ?RD pager # and on-call/weekend pager # available in [place]  ? ?

## 2021-03-29 DIAGNOSIS — I10 Essential (primary) hypertension: Secondary | ICD-10-CM | POA: Diagnosis not present

## 2021-03-29 DIAGNOSIS — I2510 Atherosclerotic heart disease of native coronary artery without angina pectoris: Secondary | ICD-10-CM | POA: Diagnosis not present

## 2021-03-29 DIAGNOSIS — J9601 Acute respiratory failure with hypoxia: Secondary | ICD-10-CM | POA: Diagnosis not present

## 2021-03-29 DIAGNOSIS — D638 Anemia in other chronic diseases classified elsewhere: Secondary | ICD-10-CM | POA: Diagnosis not present

## 2021-03-29 LAB — COMPREHENSIVE METABOLIC PANEL
ALT: 8 U/L (ref 0–44)
AST: 21 U/L (ref 15–41)
Albumin: 2.5 g/dL — ABNORMAL LOW (ref 3.5–5.0)
Alkaline Phosphatase: 73 U/L (ref 38–126)
Anion gap: 13 (ref 5–15)
BUN: 49 mg/dL — ABNORMAL HIGH (ref 8–23)
CO2: 19 mmol/L — ABNORMAL LOW (ref 22–32)
Calcium: 8.2 mg/dL — ABNORMAL LOW (ref 8.9–10.3)
Chloride: 108 mmol/L (ref 98–111)
Creatinine, Ser: 1.78 mg/dL — ABNORMAL HIGH (ref 0.61–1.24)
GFR, Estimated: 36 mL/min — ABNORMAL LOW (ref >60)
Glucose, Bld: 144 mg/dL — ABNORMAL HIGH (ref 70–99)
Potassium: 4.2 mmol/L (ref 3.5–5.1)
Sodium: 140 mmol/L (ref 135–145)
Total Bilirubin: 0.6 mg/dL (ref 0.3–1.2)
Total Protein: 5.1 g/dL — ABNORMAL LOW (ref 6.5–8.1)

## 2021-03-29 LAB — PHOSPHORUS: Phosphorus: 2.5 mg/dL (ref 2.5–4.6)

## 2021-03-29 LAB — GLUCOSE, CAPILLARY
Glucose-Capillary: 154 mg/dL — ABNORMAL HIGH (ref 70–99)
Glucose-Capillary: 105 mg/dL — ABNORMAL HIGH (ref 70–99)
Glucose-Capillary: 155 mg/dL — ABNORMAL HIGH (ref 70–99)
Glucose-Capillary: 256 mg/dL — ABNORMAL HIGH (ref 70–99)
Glucose-Capillary: 263 mg/dL — ABNORMAL HIGH (ref 70–99)
Glucose-Capillary: 108 mg/dL — ABNORMAL HIGH (ref 70–99)

## 2021-03-29 LAB — CBC WITH DIFFERENTIAL/PLATELET
Abs Immature Granulocytes: 0.22 10*3/uL — ABNORMAL HIGH (ref 0.00–0.07)
Basophils Absolute: 0 10*3/uL (ref 0.0–0.1)
Basophils Relative: 0 %
Eosinophils Absolute: 0 10*3/uL (ref 0.0–0.5)
Eosinophils Relative: 0 %
HCT: 27.8 % — ABNORMAL LOW (ref 39.0–52.0)
Hemoglobin: 8.3 g/dL — ABNORMAL LOW (ref 13.0–17.0)
Immature Granulocytes: 2 %
Lymphocytes Relative: 7 %
Lymphs Abs: 1 10*3/uL (ref 0.7–4.0)
MCH: 31.7 pg (ref 26.0–34.0)
MCHC: 29.9 g/dL — ABNORMAL LOW (ref 30.0–36.0)
MCV: 106.1 fL — ABNORMAL HIGH (ref 80.0–100.0)
Monocytes Absolute: 0.8 10*3/uL (ref 0.1–1.0)
Monocytes Relative: 6 %
Neutro Abs: 11.5 10*3/uL — ABNORMAL HIGH (ref 1.7–7.7)
Neutrophils Relative %: 85 %
Platelets: 252 10*3/uL (ref 150–400)
RBC: 2.62 MIL/uL — ABNORMAL LOW (ref 4.22–5.81)
RDW: 12.5 % (ref 11.5–15.5)
WBC: 13.6 10*3/uL — ABNORMAL HIGH (ref 4.0–10.5)
nRBC: 0 % (ref 0.0–0.2)

## 2021-03-29 LAB — MAGNESIUM: Magnesium: 2.4 mg/dL (ref 1.7–2.4)

## 2021-03-29 MED ORDER — AZITHROMYCIN 250 MG PO TABS: 500.0000 mg | ORAL_TABLET | Freq: Every day | ORAL | Status: DC

## 2021-03-29 MED ORDER — ALBUMIN HUMAN 25 % IV SOLN
50.0000 g | Freq: Once | INTRAVENOUS | Status: AC
  Administered 2021-03-29: 50 g via INTRAVENOUS
  Filled 2021-03-29: qty 200

## 2021-03-29 MED ORDER — SODIUM CHLORIDE 0.9 % IV SOLN
500.0000 mg | INTRAVENOUS | Status: AC
  Administered 2021-03-29 – 2021-03-30 (×2): 500 mg via INTRAVENOUS
  Filled 2021-03-29 (×2): qty 5

## 2021-03-29 NOTE — Progress Notes (Signed)
?PROGRESS NOTE ? ? ? ?[name]  VVO:[id number] DOB: 1931-03-11 DOA: 03/25/2021 ?PCP: Patient, No Pcp Per (Inactive)  ? ? ? ?Brief Narrative:  ?88 y.o. WM PMHx allergy rhinitis, osteoarthritis, kyphosis, asthmatic bronchitis, CAD, type II DM, stage III CKD, history of DVT, hyperlipidemia, GERD, impaired hearing, motor lithiasis, skin cancer, hypertension, hypothyroidism, macular degeneration, paroxysmal atrial fibrillation, Parkinson's disease, history of unspecified stroke, prostate cancer, PAF, urgency frequency syndrome, urticaria who was just discharged yesterday from the hospital due to viral pneumonia and is being sent from his facility due to dyspnea and decreased oral intake.  EMS stated he was 74% on room air.  The patient is unable to elaborate further, but answer simple questions.  He denied headache, back, chest or abdominal pain. ? ?ED course: Initial vital signs were temperature 97.5 ?F, pulse 76, respirations 23, BP 163/88 mmHg O2 sat 93% on nasal cannula oxygen.  The patient received ceftriaxone and azithromycin IVPB. ?  ?Lab work: CBC showed a white count of 23.4 with 92% neutrophils, hemoglobin 12.0 g/dL platelets 337.  Venous blood gas was normal except for a decreased PO2 less than 31 mmHg.  Troponin was 22 ng/L, lactic acid was normal.  Coronavirus and influenza PCR negative.  CMP with normal electrolytes once calcium corrected to albumin.  Glucose 209, BUN 61 and creatinine 2.24 mg/dL.  Albumin was 3.4 g/dL, all other hepatic functions were normal. ?  ?Imaging: One-view portable chest radiograph show bibasilar airspace disease concerning for pneumonia. ? ? ?Subjective: ?3/18 A/O x3 (does not know where).  Talking to staff about being at [facility]. ? ? ?Assessment & Plan: ?Covid vaccination; ?  ?Principal Problem: ?  Acute respiratory failure with hypoxia ([place]) ?Active Problems: ?  Pneumonia ?  Type II diabetes mellitus with renal manifestations ([place]) ?  Benign essential HTN ?   Paroxysmal atrial fibrillation (HCC) ?  Parkinson's disease ([place]) ?  Coronary artery disease involving native coronary artery of native heart without angina pectoris ?  Hypothyroidism ?  Anemia of chronic disease ?  GERD (gastroesophageal reflux disease) ?  Hyperlipidemia LDL goal <70 ?  Physical deconditioning ?  Protein calorie malnutrition ([place]) ?  Aspiration into lower respiratory tract ?  Aphasia due to disease ? ? ? Acute respiratory failure with hypoxia ([place]) ?-In the setting of viral versus bacterial Pneumonia ?-Continuous pulse ox monitoring ?- Titrate O2 to maintain SPO2> 92% ?-DuoNeb TID ?-Complete 5-day course antibiotics ?-Check sputum Gram stain. ?-Check strep pneumoniae urinary antigen. ? ?Aspiration into lower respiratory tract  ?- "pt requires the [facility] to be elevated at least 30 degrees most of the time. and frequent/immediate body changes ?-3/17 patient failed swallow evaluation: Placed on Dysphagia 2 nectar thick ? ? ?DM type II with renal manifestations ([place]) ?-3/16 hemoglobin A1c = 6.4  ?- 3/15 lipid panel pending ?-.3/15 increase moderate SSI ? ?  ?Hyperlipidemia LDL goal <70 ?-3/16 LDL= 140 . ?- Patient allergic to statins, Ezetimibe therefore will not start medication ?  ?Benign Essential HTN/Hypotension ?-Currently hold all BP medication  ?-3/18 Albumin 50 g x 1 ?  ?Paroxysmal atrial fibrillation (HCC) ?-3/15 currently NSR . ?-3/15 continue apixaban 2.5 mg p.o. twice daily. ? ?CAD involving native coronary artery of native heart without angina pectoris ?-See A-fib  ? ?Parkinson's disease ([place]) ?-Continue Sinemet p.o. 3 times daily. ? ?Dementia ?- Patient appears to have some underlying dementia. ?-3/15 Haldol PRN ?- 3/15 Seroquel 25 mg QHS ? ?Hypothyroidism ?-3/16 TSH =3.66 ?-levothyroxine 150 mcg  p.o. daily. ?  ?Anemia of chronic disease ?-Monitor hematocrit hemoglobin. ?Lab Results  ?Component Value Date  ? HGB 8.3 (L) 03/29/2021  ? HGB 8.4 (L) 03/28/2021  ? HGB 10.0 (L) 03/27/2021  ? HGB  9.3 (L) 03/26/2021  ? HGB 12.0 (L) 03/25/2021  ? ?  ?GERD (gastroesophageal reflux disease) ?pantoprazole 40 mg p.o. daily. ? ?CKD Stage 3B (baseline Cr 2.04) ?Lab Results  ?Component Value Date  ? CREATININE 1.78 (H) 03/29/2021  ? CREATININE 1.98 (H) 03/28/2021  ? CREATININE 2.20 (H) 03/27/2021  ? CREATININE 2.33 (H) 03/26/2021  ? CREATININE 2.24 (H) 03/25/2021  ?-3/18 baseline ? ?Protein calorie malnutrition severe ?-3/16 D5-0.9% saline at 67m/hr ?-3/17 Placed on Dysphagia 2 nectar thick ? ?  ?Physical deconditioning ?Given overall state of health:Consider palliative care evaluation if no improvement. ? ? ? ?Work-up goals of care ?- 3/16 PT/OT consult:Acute respiratory failure with hypoxia, dementia, deconditioning evaluate for SNF. ?-3/17 Palliative Care Consult: Multisystem organ failure with frequent hospital visits.  [facility] ?  ? ? ?DVT prophylaxis: Apixaban ?Code Status: DNR ?Family Communication:  ?Status is: Inpatient ? ? ? ?Dispo: The patient is from: SNF ?             Anticipated d/c is to: SNF ?             Anticipated d/c date is: 3 days ?             Patient currently stable for discharge ? ? ? ? ? ?Consultants:  ? ? ?Procedures/Significant Events:  ? ? ?I have personally reviewed and interpreted all radiology studies and my findings are as above. ? ?VENTILATOR SETTINGS: ?Nasal cannula 3/18 ?Flow 2 L/min ?SPO2 99% ? ? ?Cultures ?3/14 blood pending ?3/14 influenza A/B negative ?3/14 SARS coronavirus negative ?3/15 respiratory virus panel negative ? ? ?Antimicrobials: ?Anti-infectives (From admission, onward)  ? ? Start     Ordered Stop  ? 03/26/21 1100  cefTRIAXone (ROCEPHIN) 2 g in sodium chloride 0.9 % 100 mL IVPB       ? 03/25/21 1306 03/31/21 1059  ? 03/26/21 1100  azithromycin (ZITHROMAX) 500 mg in sodium chloride 0.9 % 250 mL IVPB       ? 03/25/21 1306 03/31/21 1059  ? 03/25/21 1600  Uro-MP 118 MG CAPS 1 capsule  Status:   Discontinued       ? 03/25/21 1305 03/25/21 1354  ? 03/25/21 1600  Urelle (URELLE/URISED) 81 MG tablet 81 mg       ? 03/25/21 1354    ? 03/25/21 1000  cefTRIAXone (ROCEPHIN) 2 g in sodium chloride 0.9 % 100 mL IVPB       ? 03/25/21 0947 03/25/21 1134  ? 03/25/21 1000  azithromycin (ZITHROMAX) 500 mg in sodium chloride 0.9 % 250 mL IVPB       ? 03/25/21 0947 03/25/21 1208  ? ?  ?  ? ? ?Devices ?  ? ?LINES / TUBES:  ? ? ? ? ?Continuous Infusions: ? azithromycin 500 mg (03/28/21 1211)  ? cefTRIAXone (ROCEPHIN)  IV 2 g (03/28/21 1115)  ? dextrose 5 % and 0.9% NaCl 75 mL/hr at 03/29/21 0656  ? ? ? ?Objective: ?Vitals:  ? 03/29/21 0537 03/29/21 0817 03/29/21 1120 03/29/21 1122  ?BP: 124/74  (!) 98/48 (!) 96/46  ?Pulse: 78  88   ?Resp: 18     ?Temp: 98.7 ?F (37.1 ?C)     ?TempSrc:  Axillary     ?SpO2: 95% 93% 95%   ?Weight:      ?Height:      ? ? ?Intake/Output Summary (Last 24 hours) at 03/29/2021 1135 ?Last data filed at 03/29/2021 0600 ?Gross per 24 hour  ?Intake 2323.48 ml  ?Output 1000 ml  ?Net 1323.48 ml  ? ? ?Filed Weights  ? 03/25/21 0951  ?Weight: 68.9 kg  ? ? ?Examination: ? ?General:A/O x3 (does not know where), follows commands. No acute respiratory distress ?Eyes: negative scleral hemorrhage, negative anisocoria, negative icterus ?ENT: Negative Runny nose, negative gingival bleeding, ?Neck:  Negative scars, masses, torticollis, lymphadenopathy, JVD ?Lungs: Clear to auscultation bilaterally without wheezes or crackles ?Cardiovascular: Regular rate and rhythm without murmur gallop or rub normal S1 and S2 ?Abdomen: negative abdominal pain, nondistended, positive soft, bowel sounds, no rebound, no ascites, no appreciable mass ?Extremities: No significant cyanosis, clubbing, or edema bilateral lower extremities ?Skin: Negative rashes, lesions, ulcers ?Negative depression, negative anxiety, negative fatigue, negative mania  ?Central nervous system:  Cranial nerves II through XII intact, tongue/uvula midline, all  extremities muscle strength 5/5, sensation intact throughout, positive dysarthria, negative expressive aphasia, negative receptive aphasia. ? ?.  ? ? ? ?Data Reviewed: Care during the described time interva

## 2021-03-30 DIAGNOSIS — D638 Anemia in other chronic diseases classified elsewhere: Secondary | ICD-10-CM | POA: Diagnosis not present

## 2021-03-30 DIAGNOSIS — Z7189 Other specified counseling: Secondary | ICD-10-CM

## 2021-03-30 DIAGNOSIS — R5381 Other malaise: Secondary | ICD-10-CM | POA: Diagnosis not present

## 2021-03-30 DIAGNOSIS — I10 Essential (primary) hypertension: Secondary | ICD-10-CM | POA: Diagnosis not present

## 2021-03-30 DIAGNOSIS — Z515 Encounter for palliative care: Secondary | ICD-10-CM | POA: Diagnosis not present

## 2021-03-30 DIAGNOSIS — I2510 Atherosclerotic heart disease of native coronary artery without angina pectoris: Secondary | ICD-10-CM | POA: Diagnosis not present

## 2021-03-30 DIAGNOSIS — J9601 Acute respiratory failure with hypoxia: Secondary | ICD-10-CM | POA: Diagnosis not present

## 2021-03-30 LAB — CBC WITH DIFFERENTIAL/PLATELET
Abs Immature Granulocytes: 0.15 10*3/uL — ABNORMAL HIGH (ref 0.00–0.07)
Basophils Absolute: 0 10*3/uL (ref 0.0–0.1)
Basophils Relative: 0 %
Eosinophils Absolute: 0.3 10*3/uL (ref 0.0–0.5)
Eosinophils Relative: 3 %
HCT: 21.6 % — ABNORMAL LOW (ref 39.0–52.0)
Hemoglobin: 6.7 g/dL — CL (ref 13.0–17.0)
Immature Granulocytes: 2 %
Lymphocytes Relative: 11 %
Lymphs Abs: 1.1 10*3/uL (ref 0.7–4.0)
MCH: 31.8 pg (ref 26.0–34.0)
MCHC: 31 g/dL (ref 30.0–36.0)
MCV: 102.4 fL — ABNORMAL HIGH (ref 80.0–100.0)
Monocytes Absolute: 0.8 10*3/uL (ref 0.1–1.0)
Monocytes Relative: 7 %
Neutro Abs: 7.9 10*3/uL — ABNORMAL HIGH (ref 1.7–7.7)
Neutrophils Relative %: 77 %
Platelets: 245 10*3/uL (ref 150–400)
RBC: 2.11 MIL/uL — ABNORMAL LOW (ref 4.22–5.81)
RDW: 12.6 % (ref 11.5–15.5)
WBC: 10.2 10*3/uL (ref 4.0–10.5)
nRBC: 0 % (ref 0.0–0.2)

## 2021-03-30 LAB — CULTURE, BLOOD (ROUTINE X 2)
Culture: NO GROWTH
Culture: NO GROWTH

## 2021-03-30 LAB — PHOSPHORUS: Phosphorus: 2.6 mg/dL (ref 2.5–4.6)

## 2021-03-30 LAB — COMPREHENSIVE METABOLIC PANEL
ALT: 6 U/L (ref 0–44)
AST: 16 U/L (ref 15–41)
Albumin: 2.9 g/dL — ABNORMAL LOW (ref 3.5–5.0)
Alkaline Phosphatase: 61 U/L (ref 38–126)
Anion gap: 8 (ref 5–15)
BUN: 41 mg/dL — ABNORMAL HIGH (ref 8–23)
CO2: 23 mmol/L (ref 22–32)
Calcium: 7.9 mg/dL — ABNORMAL LOW (ref 8.9–10.3)
Chloride: 112 mmol/L — ABNORMAL HIGH (ref 98–111)
Creatinine, Ser: 1.69 mg/dL — ABNORMAL HIGH (ref 0.61–1.24)
GFR, Estimated: 39 mL/min — ABNORMAL LOW (ref >60)
Glucose, Bld: 180 mg/dL — ABNORMAL HIGH (ref 70–99)
Potassium: 3.6 mmol/L (ref 3.5–5.1)
Sodium: 143 mmol/L (ref 135–145)
Total Bilirubin: 0.4 mg/dL (ref 0.3–1.2)
Total Protein: 5 g/dL — ABNORMAL LOW (ref 6.5–8.1)

## 2021-03-30 LAB — HEMOGLOBIN AND HEMATOCRIT, BLOOD
HCT: 23.3 % — ABNORMAL LOW (ref 39.0–52.0)
Hemoglobin: 7.2 g/dL — ABNORMAL LOW (ref 13.0–17.0)

## 2021-03-30 LAB — PREPARE RBC (CROSSMATCH)

## 2021-03-30 LAB — GLUCOSE, CAPILLARY
Glucose-Capillary: 140 mg/dL — ABNORMAL HIGH (ref 70–99)
Glucose-Capillary: 144 mg/dL — ABNORMAL HIGH (ref 70–99)
Glucose-Capillary: 189 mg/dL — ABNORMAL HIGH (ref 70–99)
Glucose-Capillary: 203 mg/dL — ABNORMAL HIGH (ref 70–99)
Glucose-Capillary: 228 mg/dL — ABNORMAL HIGH (ref 70–99)
Glucose-Capillary: 97 mg/dL (ref 70–99)

## 2021-03-30 LAB — MAGNESIUM: Magnesium: 2.1 mg/dL (ref 1.7–2.4)

## 2021-03-30 MED ORDER — SODIUM CHLORIDE 0.9% IV SOLUTION: Freq: Once | INTRAVENOUS | Status: AC

## 2021-03-30 NOTE — Consult Note (Signed)
? ?                                                                                ?Consultation Note ?Date: 03/30/2021  ? ?Patient Name: [name]  ?DOB: [birth year]  MRN: [id number]  Age / Sex: 88 y.o., male  ?PCP: Patient, No Pcp Per (Inactive) ?Referring Physician: Allie Bossier, MD ? ?Reason for Consultation: Establishing goals of care ? ?HPI/Patient Profile: 88 y.o. male admitted on 03/25/2021   ? ?Clinical Assessment and Goals of Care: ?88 year old gentleman with a past medical history significant for coronary artery disease, diabetes, stage III chronic kidney disease, DVT, GERD, impaired hearing, macular degeneration Parkinson's disease unspecified stroke paroxysmal atrial fibrillation admitted to hospital medicine service with acute hypoxic respiratory failure pneumonia, concern for aspiration. ?Patient has been given antibiotics, has been seen and evaluated by SLP with recommendations for dysphagia 2 nectar thick liquids.  Palliative medicine team consulted for ongoing goals of care discussions. ?Patient is resting in bed.  Appears frail and weak.  Does not appear in acute distress.  Awakens and responds to some.  Denies pain.  Chart reviewed, advance care planning documents in the [facility] namely DNR form and previously prepared healthcare power of attorney agent paperwork. ?Chart reviewed, patient has palliative services through [facility] at his facility. ?See below. ? ?HCPOA ? Daughter.  ? ?SUMMARY OF RECOMMENDATIONS   ?DNR DNI ?Continue current mode of care. ?Call placed and discussed with daughter healthcare power of attorney agent.  She request for hospital bed at the patient's assisted living facility.  We discussed about current hospital course and for continuation of palliative services which the patient already has through [facility] at his facility. ?No further palliative medicine team specific recommendations ?Thank you for the consult. ? ?Code Status/Advance Care  Planning: ?DNR ? ? ?Symptom Management:  ?  ? ?Palliative Prophylaxis:  ?Delirium Protocol ?  ?Psycho-social/Spiritual:  ?Desire for further Chaplaincy support:no ?Additional Recommendations: Education on Hospice ? ?Prognosis:  ?< 12 months ? ?Discharge Planning:  Recommend continuation of palliative services at his facility [facility].    ? ?  ? ?Primary Diagnoses: ?Present on Admission: ? Acute respiratory failure with hypoxia ([place]) ? Type II diabetes mellitus with renal manifestations ([place]) ? Benign essential HTN ? Paroxysmal atrial fibrillation (HCC) ? Parkinson's disease ([place]) ? Coronary artery disease involving native coronary artery of native heart without angina pectoris ? Hypothyroidism ? GERD (gastroesophageal reflux disease) ? Hyperlipidemia LDL goal <70 ? Anemia of chronic disease ? Physical deconditioning ? Pneumonia ? Protein calorie malnutrition ([place]) ? Aspiration into lower respiratory tract ? ? ?I have reviewed the medical record, interviewed the patient and family, and examined the patient. The following aspects are pertinent. ? ?Past Medical History:  ?Diagnosis Date  ? Allergic rhinitis   ? Arthritis   ? arthritis-kyphosis  ? Asthmatic bronchitis   ? CAD in native artery   ? a. STEMI 07/2017 s/p DES to LCx (25% mRCA and 70% oOM1 residual).  ? CKD (chronic kidney disease) stage 3, GFR 30-59 ml/min (HCC)   ? DM type 2 (diabetes mellitus, type 2) ([place])   ? DVT, lower extremity, proximal, acute (  [place])   ? Dyslipidemia   ? GERD (gastroesophageal reflux disease)   ? Hearing impaired   ? bilateral hearing aids  ? History of kidney stones   ? History of skin cancer   ? Hypertension   ? Hypothyroidism   ? Macular degeneration   ? injections done bilaterally recent - 08-22-13  ? Normocytic anemia   ? PAF (paroxysmal atrial fibrillation) ([place])   ? Parkinson's disease ([place])   ? Prostate cancer ([place])   ? Prostate cancer-radiation only,skin cancer-basal cell and last squamous cell right eye  ? Statin  intolerance   ? Stroke [facility])   ? Urgency-frequency syndrome   ? Urticaria   ? ?Social History  ? ?Socioeconomic History  ? Marital status: Married  ?  Spouse name: Not on file  ? Number of children: 2  ? Years of education: Not on file  ? Highest education level: Not on file  ?Occupational History  ? Occupation: retired  ?Tobacco Use  ? Smoking status: Never  ? Smokeless tobacco: Never  ?Substance and Sexual Activity  ? Alcohol use: No  ? Drug use: No  ? Sexual activity: Not Currently  ?Other Topics Concern  ? Not on file  ?Social History Narrative  ? ** Merged History Encounter **  ?    ? MUST HAVE MEDS LISTED DO NOT SUBSTITUTE GENERICS PATIENT IS ALLERGIC TO DYES  ? ?Social Determinants of Health  ? ?Financial Resource Strain: Not on file  ?Food Insecurity: Not on file  ?Transportation Needs: Not on file  ?Physical Activity: Not on file  ?Stress: Not on file  ?Social Connections: Not on file  ? ?Family History  ?Problem Relation Age of Onset  ? Other Sister   ?     ophth disease  ? Heart attack Father   ?     x7  ? Heart disease Father   ?     MI x7  ? Hypertension Mother   ? Stroke Mother 94  ? Amblyopia Neg Hx   ? Blindness Neg Hx   ? Glaucoma Neg Hx   ? Macular degeneration Neg Hx   ? Retinal detachment Neg Hx   ? Strabismus Neg Hx   ? Retinitis pigmentosa Neg Hx   ? Cataracts Neg Hx   ? ?Scheduled Meds: ? albuterol  2.5 mg Nebulization BID  ? alfuzosin  10 mg Oral QPM  ? apixaban  2.5 mg Oral BID  ? brimonidine  1 drop Both Eyes BID  ? And  ? timolol  1 drop Both Eyes BID  ? brinzolamide  1 drop Right Eye TID  ? carbidopa-levodopa  1 tablet Oral TID  ? docusate sodium  100 mg Oral BID  ? ferrous sulfate  325 mg Oral BID WC  ? insulin aspart  0-15 Units Subcutaneous Q4H  ? latanoprost  1 drop Right Eye QHS  ? levothyroxine  150 mcg Oral Daily  ? loratadine  10 mg Oral QHS  ? mouth rinse  15 mL Mouth Rinse BID  ? melatonin  3 mg Oral QHS  ? multivitamin  15 mL Oral Daily  ? pantoprazole  40 mg Oral BID  ?  polyvinyl alcohol  1 drop Both Eyes QID  ? QUEtiapine  25 mg Oral QHS  ? Urelle  1 tablet Oral TID  ? ?Continuous Infusions: ? azithromycin 250 mL/hr at 03/29/21 1746  ? dextrose 5 % and 0.9% NaCl 75 mL/hr at 03/29/21 2245  ? ?PRN Meds:.acetaminophen **  OR** acetaminophen, haloperidol lactate, ondansetron **OR** ondansetron (ZOFRAN) IV ?Medications Prior to Admission:  ?Prior to Admission medications   ?Medication Sig Start Date End Date Taking? Authorizing Provider  ?alfuzosin (UROXATRAL) 10 MG 24 hr tablet Take 1 tablet (10 mg total) by mouth at bedtime. ?Patient taking differently: Take 10 mg by mouth every evening. 02/10/16  Yes Kathie Rhodes, MD  ?amiodarone (PACERONE) 200 MG tablet Take 1 tablet (200 mg total) by mouth daily. 08/17/19  Yes Dwyane Dee, MD  ?apixaban (ELIQUIS) 2.5 MG TABS tablet Take 2.5 mg by mouth 2 (two) times daily.   Yes [provider]  ?brimonidine-timolol (COMBIGAN) 0.2-0.5 % ophthalmic solution Place 1 drop into both eyes 2 (two) times daily.   Yes [provider]  ?brinzolamide (AZOPT) 1 % ophthalmic suspension Place 1 drop into the right eye 3 (three) times daily.   Yes [provider]  ?carbidopa-levodopa (SINEMET IR) 25-100 MG tablet Take 1 tablet by mouth 3 (three) times daily. 0800, 1400, and 2000   Yes [provider]  ?Carboxymethylcellulose Sodium (ARTIFICIAL TEARS OP) Place 1 drop into both eyes 4 (four) times daily.   Yes [provider]  ?Cholecalciferol (VITAMIN D-3) 5000 units TABS Take 5,000 Units by mouth daily.   Yes [provider]  ?clopidogrel (PLAVIX) 75 MG tablet Take 75 mg by mouth daily.    Yes [provider]  ?Dextrose, Diabetic Use, (INSTA-GLUCOSE PO) Take 1 Tube by mouth as needed (hypoglycemia or BS <60).   Yes [provider]  ?diltiazem (CARDIZEM CD) 120 MG 24 hr capsule Take 1 capsule (120 mg total) by mouth daily for 30 days. 03/12/18  Yes Elodia Florence., MD  ?docusate sodium  (COLACE) 100 MG capsule Take 100 mg by mouth 2 (two) times daily.    Yes [provider]  ?ferrous sulfate 325 (65 FE) MG EC tablet Take 1 tablet (325 mg total) by mouth 2 (two) times daily with a meal. 11/14/18 05/17/21 Yes [place],

## 2021-03-30 NOTE — Progress Notes (Signed)
?PROGRESS NOTE ? ? ? ?[name]  TGG:[id number] DOB: [birth year] DOA: 03/25/2021 ?PCP: Patient, No Pcp Per (Inactive)  ? ? ? ?Brief Narrative:  ?88 y.o. WM PMHx allergy rhinitis, osteoarthritis, kyphosis, asthmatic bronchitis, CAD, type II DM, stage III CKD, history of DVT, hyperlipidemia, GERD, impaired hearing, motor lithiasis, skin cancer, hypertension, hypothyroidism, macular degeneration, paroxysmal atrial fibrillation, Parkinson's disease, history of unspecified stroke, prostate cancer, PAF, urgency frequency syndrome, urticaria who was just discharged yesterday from the hospital due to viral pneumonia and is being sent from his facility due to dyspnea and decreased oral intake.  EMS stated he was 74% on room air.  The patient is unable to elaborate further, but answer simple questions.  He denied headache, back, chest or abdominal pain. ? ?ED course: Initial vital signs were temperature 97.5 ?F, pulse 76, respirations 23, BP 163/88 mmHg O2 sat 93% on nasal cannula oxygen.  The patient received ceftriaxone and azithromycin IVPB. ?  ?Lab work: CBC showed a white count of 23.4 with 92% neutrophils, hemoglobin 12.0 g/dL platelets 337.  Venous blood gas was normal except for a decreased PO2 less than 31 mmHg.  Troponin was 22 ng/L, lactic acid was normal.  Coronavirus and influenza PCR negative.  CMP with normal electrolytes once calcium corrected to albumin.  Glucose 209, BUN 61 and creatinine 2.24 mg/dL.  Albumin was 3.4 g/dL, all other hepatic functions were normal. ?  ?Imaging: One-view portable chest radiograph show bibasilar airspace disease concerning for pneumonia. ? ? ?Subjective: ?3/19 A/O x3 (does not know where).  Greeted me upon entering the room shook my hand started joking. ? ? ? ?Assessment & Plan: ?Covid vaccination; ?  ?Principal Problem: ?  Acute respiratory failure with hypoxia ([place]) ?Active Problems: ?  Pneumonia ?  Type II diabetes mellitus with renal manifestations ([place]) ?  Benign  essential HTN ?  Paroxysmal atrial fibrillation (HCC) ?  Parkinson's disease ([place]) ?  Coronary artery disease involving native coronary artery of native heart without angina pectoris ?  Hypothyroidism ?  Anemia of chronic disease ?  GERD (gastroesophageal reflux disease) ?  Hyperlipidemia LDL goal <70 ?  Physical deconditioning ?  Protein calorie malnutrition ([place]) ?  Aspiration into lower respiratory tract ?  Aphasia due to disease ? ? ? Acute respiratory failure with hypoxia ([place]) ?-In the setting of viral versus bacterial Pneumonia ?-Continuous pulse ox monitoring ?- Titrate O2 to maintain SPO2> 92% ?-DuoNeb TID ?-Complete 5-day course antibiotics ?-Check sputum Gram stain. ?-Check strep pneumoniae urinary antigen. ? ?Aspiration into lower respiratory tract  ?- "pt requires the [facility] to be elevated at least 30 degrees most of the time. and frequent/immediate body changes ?-3/17 patient failed swallow evaluation: Placed on Dysphagia 2 nectar thick ? ? ?DM type II with renal manifestations ([place]) ?-3/16 hemoglobin A1c = 6.4  ?- 3/15 lipid panel pending ?-.3/15 increase moderate SSI ? ?  ?Hyperlipidemia LDL goal <70 ?-3/16 LDL= 140 . ?- Patient allergic to statins, Ezetimibe therefore will not start medication ?  ?Benign Essential HTN/Hypotension ?-Currently hold all BP medication  ?-3/18 Albumin 50 g x 1 ?  ?Paroxysmal atrial fibrillation (HCC) ?-3/15 currently NSR . ?-3/15 continue apixaban 2.5 mg p.o. twice daily. ? ?CAD involving native coronary artery of native heart without angina pectoris ?-See A-fib  ? ?Parkinson's disease ([place]) ?-Continue Sinemet p.o. 3 times daily. ? ?Dementia ?- Patient appears to have some underlying dementia. ?-3/15 Haldol PRN ?- 3/15 Seroquel 25 mg QHS ? ?Hypothyroidism ?-3/16 TSH =3.66 ?-  levothyroxine 150 mcg p.o. daily. ?  ?Anemia of chronic disease ?-Monitor hematocrit hemoglobin. ?Lab Results  ?Component Value Date  ? HGB 7.2 (L) 03/30/2021  ? HGB 6.7 (LL) 03/30/2021  ? HGB 8.3 (L)  03/29/2021  ? HGB 8.4 (L) 03/28/2021  ? HGB 10.0 (L) 03/27/2021  ?-3/19 transfuse 1 unit PRBC ?  ?GERD (gastroesophageal reflux disease) ?pantoprazole 40 mg p.o. daily. ? ?CKD Stage 3B (baseline Cr 2.04) ?Lab Results  ?Component Value Date  ? CREATININE 1.69 (H) 03/30/2021  ? CREATININE 1.78 (H) 03/29/2021  ? CREATININE 1.98 (H) 03/28/2021  ? CREATININE 2.20 (H) 03/27/2021  ? CREATININE 2.33 (H) 03/26/2021  ?-3/18 baseline ? ?Protein calorie malnutrition severe ?-3/16 D5-0.9% saline at 81m/hr ?-3/17 Placed on Dysphagia 2 nectar thick ? ?  ?Physical deconditioning ?Given overall state of health:Consider palliative care evaluation if no improvement. ? ? ? ?Work-up goals of care ?- 3/16 PT/OT consult:Acute respiratory failure with hypoxia, dementia, deconditioning evaluate for SNF. ?-3/17 Palliative Care Consult: Multisystem organ failure with frequent hospital visits.  [facility] ?  ? ? ?DVT prophylaxis: Apixaban ?Code Status: DNR ?Family Communication:  ?Status is: Inpatient ? ? ? ?Dispo: The patient is from: SNF ?             Anticipated d/c is to: SNF ?             Anticipated d/c date is: 3/20 ?             Patient currently stable for discharge ? ? ? ? ? ?Consultants:  ? ? ?Procedures/Significant Events:  ? ? ?I have personally reviewed and interpreted all radiology studies and my findings are as above. ? ?VENTILATOR SETTINGS: ?Nasal cannula 3/19 ?Flow 2 L/min ?SPO2 99% ? ? ?Cultures ?3/14 blood pending ?3/14 influenza A/B negative ?3/14 SARS coronavirus negative ?3/15 respiratory virus panel negative ? ? ?Antimicrobials: ?Anti-infectives (From admission, onward)  ? ? Start     Ordered Stop  ? 03/26/21 1100  cefTRIAXone (ROCEPHIN) 2 g in sodium chloride 0.9 % 100 mL IVPB       ? 03/25/21 1306 03/31/21 1059  ? 03/26/21 1100  azithromycin (ZITHROMAX) 500 mg in sodium chloride 0.9 % 250 mL IVPB       ? 03/25/21 1306 03/31/21 1059  ? 03/25/21  1600  Uro-MP 118 MG CAPS 1 capsule  Status:  Discontinued       ? 03/25/21 1305 03/25/21 1354  ? 03/25/21 1600  Urelle (URELLE/URISED) 81 MG tablet 81 mg       ? 03/25/21 1354    ? 03/25/21 1000  cefTRIAXone (ROCEPHIN) 2 g in sodium chloride 0.9 % 100 mL IVPB       ? 03/25/21 0947 03/25/21 1134  ? 03/25/21 1000  azithromycin (ZITHROMAX) 500 mg in sodium chloride 0.9 % 250 mL IVPB       ? 03/25/21 0947 03/25/21 1208  ? ?  ?  ? ? ?Devices ?  ? ?LINES / TUBES:  ? ? ? ? ?Continuous Infusions: ? dextrose 5 % and 0.9% NaCl 75 mL/hr at 03/30/21 1626  ? ? ? ?Objective: ?Vitals:  ? 03/30/21 0926 03/30/21 0950 03/30/21 1210 03/30/21 1407  ?BP: 103/60 (!) 97/50 117/69 (!) 178/100  ?Pulse: 77 78 69 76  ?Resp: (!) 24 20 (!) 21 20  ?Temp: 98.3 ?F (36.8 ?C) 98.1 ?F (36.7 ?C) 97.7 ?F (36.5 ?C) (!) 97.5 ?F (36.4 ?C)  ?  TempSrc: Axillary Axillary Axillary Oral  ?SpO2: 100% 100% 99% 100%  ?Weight:      ?Height:      ? ? ?Intake/Output Summary (Last 24 hours) at 03/30/2021 1944 ?Last data filed at 03/30/2021 1900 ?Gross per 24 hour  ?Intake 2669.86 ml  ?Output 950 ml  ?Net 1719.86 ml  ? ? ?Filed Weights  ? 03/25/21 0951  ?Weight: 68.9 kg  ? ? ?Examination: ? ?General:A/O x3 (does not know where), follows commands. No acute respiratory distress ?Eyes: negative scleral hemorrhage, negative anisocoria, negative icterus ?ENT: Negative Runny nose, negative gingival bleeding, ?Neck:  Negative scars, masses, torticollis, lymphadenopathy, JVD ?Lungs: Clear to auscultation bilaterally without wheezes or crackles ?Cardiovascular: Regular rate and rhythm without murmur gallop or rub normal S1 and S2 ?Abdomen: negative abdominal pain, nondistended, positive soft, bowel sounds, no rebound, no ascites, no appreciable mass ?Extremities: No significant cyanosis, clubbing, or edema bilateral lower extremities ?Skin: Negative rashes, lesions, ulcers ?Negative depression, negative anxiety, negative fatigue, negative mania  ?Central nervous system:  Cranial  nerves II through XII intact, tongue/uvula midline, all extremities muscle strength 5/5, sensation intact throughout, positive dysarthria, negative expressive aphasia, negative receptive aphasia. ? ?.  ? ? ? ?Da

## 2021-03-31 DIAGNOSIS — D638 Anemia in other chronic diseases classified elsewhere: Secondary | ICD-10-CM | POA: Diagnosis not present

## 2021-03-31 DIAGNOSIS — I2510 Atherosclerotic heart disease of native coronary artery without angina pectoris: Secondary | ICD-10-CM | POA: Diagnosis not present

## 2021-03-31 DIAGNOSIS — I10 Essential (primary) hypertension: Secondary | ICD-10-CM | POA: Diagnosis not present

## 2021-03-31 DIAGNOSIS — J9601 Acute respiratory failure with hypoxia: Secondary | ICD-10-CM | POA: Diagnosis not present

## 2021-03-31 LAB — RESP PANEL BY RT-PCR (FLU A&B, COVID) ARPGX2
Influenza A by PCR: NEGATIVE
Influenza B by PCR: NEGATIVE
SARS Coronavirus 2 by RT PCR: NEGATIVE

## 2021-03-31 LAB — CBC WITH DIFFERENTIAL/PLATELET
Abs Immature Granulocytes: 0.17 10*3/uL — ABNORMAL HIGH (ref 0.00–0.07)
Basophils Absolute: 0 10*3/uL (ref 0.0–0.1)
Basophils Relative: 0 %
Eosinophils Absolute: 0.3 10*3/uL (ref 0.0–0.5)
Eosinophils Relative: 3 %
HCT: 24.4 % — ABNORMAL LOW (ref 39.0–52.0)
Hemoglobin: 7.6 g/dL — ABNORMAL LOW (ref 13.0–17.0)
Immature Granulocytes: 2 %
Lymphocytes Relative: 8 %
Lymphs Abs: 0.9 10*3/uL (ref 0.7–4.0)
MCH: 31.3 pg (ref 26.0–34.0)
MCHC: 31.1 g/dL (ref 30.0–36.0)
MCV: 100.4 fL — ABNORMAL HIGH (ref 80.0–100.0)
Monocytes Absolute: 0.8 10*3/uL (ref 0.1–1.0)
Monocytes Relative: 8 %
Neutro Abs: 8.6 10*3/uL — ABNORMAL HIGH (ref 1.7–7.7)
Neutrophils Relative %: 79 %
Platelets: 224 10*3/uL (ref 150–400)
RBC: 2.43 MIL/uL — ABNORMAL LOW (ref 4.22–5.81)
RDW: 14.6 % (ref 11.5–15.5)
WBC: 10.8 10*3/uL — ABNORMAL HIGH (ref 4.0–10.5)
nRBC: 0 % (ref 0.0–0.2)

## 2021-03-31 LAB — GLUCOSE, CAPILLARY
Glucose-Capillary: 210 mg/dL — ABNORMAL HIGH (ref 70–99)
Glucose-Capillary: 174 mg/dL — ABNORMAL HIGH (ref 70–99)
Glucose-Capillary: 103 mg/dL — ABNORMAL HIGH (ref 70–99)
Glucose-Capillary: 129 mg/dL — ABNORMAL HIGH (ref 70–99)
Glucose-Capillary: 149 mg/dL — ABNORMAL HIGH (ref 70–99)
Glucose-Capillary: 158 mg/dL — ABNORMAL HIGH (ref 70–99)
Glucose-Capillary: 142 mg/dL — ABNORMAL HIGH (ref 70–99)

## 2021-03-31 LAB — COMPREHENSIVE METABOLIC PANEL
ALT: <5 U/L (ref 0–44)
AST: 16 U/L (ref 15–41)
Albumin: 2.6 g/dL — ABNORMAL LOW (ref 3.5–5.0)
Alkaline Phosphatase: 61 U/L (ref 38–126)
Anion gap: 3 — ABNORMAL LOW (ref 5–15)
BUN: 35 mg/dL — ABNORMAL HIGH (ref 8–23)
CO2: 25 mmol/L (ref 22–32)
Calcium: 7.7 mg/dL — ABNORMAL LOW (ref 8.9–10.3)
Chloride: 112 mmol/L — ABNORMAL HIGH (ref 98–111)
Creatinine, Ser: 1.62 mg/dL — ABNORMAL HIGH (ref 0.61–1.24)
GFR, Estimated: 41 mL/min — ABNORMAL LOW (ref >60)
Glucose, Bld: 104 mg/dL — ABNORMAL HIGH (ref 70–99)
Potassium: 3.2 mmol/L — ABNORMAL LOW (ref 3.5–5.1)
Sodium: 140 mmol/L (ref 135–145)
Total Bilirubin: 0.4 mg/dL (ref 0.3–1.2)
Total Protein: 4.8 g/dL — ABNORMAL LOW (ref 6.5–8.1)

## 2021-03-31 LAB — MAGNESIUM: Magnesium: 2 mg/dL (ref 1.7–2.4)

## 2021-03-31 LAB — PHOSPHORUS: Phosphorus: 2.2 mg/dL — ABNORMAL LOW (ref 2.5–4.6)

## 2021-03-31 NOTE — Progress Notes (Signed)
Patient unable to fully complete ambulating O2 Sat screen- pt only to stand and pivot to chair. But after transfer from bed to chair, pt's O2 Sats remained 96% RA. ?

## 2021-03-31 NOTE — Care Management Important Message (Signed)
Important Message ? ?Patient Details IM Letter placed in Patients room. ?Name: [name] ?MRN: [id number] ?Date of Birth: June 25, 1931 ? ? ?Medicare Important Message Given:  Yes ? ? ? ? ?Kerin Salen ?03/31/2021, 12:55 PM ?

## 2021-03-31 NOTE — Progress Notes (Signed)
Speech Language Pathology Treatment: Dysphagia  ?Patient Details ?Name: [name] ?MRN: [id number] ?DOB: [birth year] ?Today's Date: 03/31/2021 ?Time: 4163-8453 ?SLP Time Calculation (min) (ACUTE ONLY): 25 min ? ?Assessment / Plan / Recommendation ?Clinical Impression ? Patient seen by SLP for skilled treatment focused on dysphagia goals. Patient was sleeping when SLP arrived but he awakened easily and maintained adequate alertness throughout session. SLP performed oral care but although his lips, palate, teeth and tongue all stained blue (from urology medication), no secretions or PO residuals observed. After taking sip of nectar thick cranberry juice, patient requesting "a regular drink" and clarified "something like 7-Up". SLP thickened a lemon-lime soda to nectar thick consistency. Patient was total assist for feeding and SLP gave him small controlled cup sips of nectar thick soda. No overt s/s aspiration or penetration observed but suspect swallow initiaton delay. Per RN, patient has not been eating much at all but has been taking medications well. Patient may be discharging back to his ALF today with palliative care. SLP will continue to f/u with patient while admitted in hospital and recommend [facility] SLP at ALF when discharged for continued dysphagia management. ? ?  ?HPI HPI: Pt is an 88yo male returning to hospital 3/14 for hypoxia and difficulty breathing after recent admission 3/9-3/13 for pneumonia.  CXR 3/14: Bibasilar airspace disease concerning for pneumonia. Pt is resident of [facility], with hx of CVA, prosCa s/p radX, Parkinsons disease, CAD s/p STEMI 2019 Jul, hypothyroidism, HTN, DM, and Afib, GERD. Hx of dysphagia with two prior MBS studies, 07/20/16 and 04/25/19. The latter revealed "sensorimotor deficits marked by bolus incohesion, spillage of thin and nectar liquids to the pyriforms before the pharyngeal swallow is triggered, and aspiration of thin liquids during and after the swallow.   Barium fills the pyrforms and then tends to spill into the larynx.  Laryngeal vestibule closure is inconsistent.  Thin liquid aspiration elicited an intermittent cough response, but often aspiration was silent.  A chin tuck did not help to protect the airway.  Nectar liquids were not aspirated during this exam. Primary deficits appeared to be decreased pharyngeal squeeze and impaired timing of laryngeal vestibule closure." Bedside/clinical swallow evaluation completed on 3/17 recommended Dys 2 solids, nectar thick liquids. ?  ?   ?SLP Plan ? Continue with current plan of care ? ?  ?  ?Recommendations for follow up therapy are one component of a multi-disciplinary discharge planning process, led by the attending physician.  Recommendations may be updated based on patient status, additional functional criteria and insurance authorization. ?  ? ?Recommendations  ?Diet recommendations: Dysphagia 2 (fine chop);Thin liquid ?Liquids provided via: Cup;No straw ?Medication Administration: Crushed with puree ?Supervision: Full supervision/cueing for compensatory strategies;Staff to assist with self feeding ?Compensations: Slow rate;Small sips/bites ?Postural Changes and/or Swallow Maneuvers: Seated upright 90 degrees  ?   ?    ?   ? ? ? ? Oral Care Recommendations: Oral care BID;Staff/trained caregiver to provide oral care ?Follow Up Recommendations: Home health SLP ?Assistance recommended at discharge: Frequent or constant Supervision/Assistance ?SLP Visit Diagnosis: Dysphagia, oropharyngeal phase (R13.12) ?Plan: Continue with current plan of care ? ? ? ? ?  ?  ? ?Sonia Baller, MA, CCC-SLP ?Speech Therapy ? ?

## 2021-03-31 NOTE — TOC Progression Note (Addendum)
Transition of Care (TOC) - Progression Note  ? ? ?Patient Details  ?Name: [name] ?MRN: [id number] ?Date of Birth: Jun 04, 1931 ? ?Transition of Care (TOC) CM/SW Contact  ?[place], LCSW ?Phone Number: ?03/31/2021, 2:25 PM ? ?Clinical Narrative:    ? ?CSW called and confirmed with pt daughter that preference is for pt to return to [facility] and continue [facility] with [employer]. CSW confirmed this with [facility]; they request DC summary be faxed to [facility] and DC summary to be faxed to [phone]. Daughter is requesting update on hospital bed that was ordered. CSW contacted [facility]; confirmed bed was ordered but that they cannot deliver bed until pt's current bed is removed. Pt's daughter is out of state and currently working on arranging someone to remove current bed. TOC will continue to follow for DC. Will need PTAR at DC.  ? ?CSW faxed progress notes to [facility].  ? ?Expected Discharge Plan: Assisted Living ?Barriers to Discharge: Continued Medical Work up ? ?Expected Discharge Plan and Services ?Expected Discharge Plan: Assisted Living ?  ?  ?  ?  ?                ?  ?  ?  ?  ?  ?  ?  ?  ?  ?  ? ? ?Social Determinants of Health (SDOH) Interventions ?  ? ?Readmission Risk Interventions ?Readmission Risk Prevention Plan 03/26/2021  ?Transportation Screening Complete  ?PCP or Specialist Appt within 3-5 Days Complete  ?[place] or Home Care Consult Complete  ?Social Work Consult for [place] Planning/Counseling Complete  ?Palliative Care Screening Complete  ?Medication Review Press photographer) Complete  ?Some recent data might be hidden  ? ? ?

## 2021-03-31 NOTE — Progress Notes (Signed)
Occupational Therapy Treatment ?Patient Details ?Name: [name] ?MRN: [id number] ?DOB: 1931-07-16 ?Today's Date: 03/31/2021 ? ? ?History of present illness Pt is a 88yo male returning to hospital 3/14 for hypoxia and difficulty breathing after recent admission 3/9-3/13 for pneumonia.  PMH: Dementia, Parkinson's Disease, DM, HTN, PAF, GERD, hyperlipidemia, CVA, CKD 3, hypertension. ?  ?OT comments ? Patient was max A x2 for bed mobility and transfer to chair with patient reporting increased pain in L hip/back with sitting in recliner. Patient's nurse was made aware of pain in L side with nurse reporting she would look into pain medications. Patient would continue to benefit from skilled OT services at this time while admitted and after d/c to address noted deficits in order to improve overall safety and independence in ADLs.  ?  ? ?Recommendations for follow up therapy are one component of a multi-disciplinary discharge planning process, led by the attending physician.  Recommendations may be updated based on patient status, additional functional criteria and insurance authorization. ?   ?Follow Up Recommendations ? Skilled nursing-short term rehab (<3 hours/day)  ?  ?Assistance Recommended at Discharge Frequent or constant Supervision/Assistance  ?Patient can return home with the following ? A lot of help with walking and/or transfers;A lot of help with bathing/dressing/bathroom;Assistance with cooking/housework;Direct supervision/assist for medications management;Direct supervision/assist for financial management;Assist for transportation;Help with stairs or ramp for entrance ?  ?Equipment Recommendations ? None recommended by OT  ?  ?Recommendations for Other Services   ? ?  ?Precautions / Restrictions Precautions ?Precautions: Fall ?Precaution Comments: HOB elevated >30deg ?Restrictions ?Weight Bearing Restrictions: No  ? ? ?  ? ?Mobility Bed Mobility ?Overal bed mobility: Needs Assistance ?Bed Mobility:  Supine to Sit ?  ?  ?Supine to sit: +2 for physical assistance, Max assist ?  ?  ?General bed mobility comments: Max A for trunk support to sit upright with strong posterior leaning during inital sitting ?  ? ?Transfers ?  ?  ?  ?  ?  ?  ?  ?  ?  ?  ?  ?  ?Balance Overall balance assessment: Needs assistance ?Sitting-balance support: Feet supported ?Sitting balance-Leahy Scale: Fair ?Sitting balance - Comments: initially posterior lean, then fair ?Postural control: Posterior lean ?Standing balance support: Reliant on assistive device for balance, During functional activity ?Standing balance-Leahy Scale: Poor ?Standing balance comment: relies on BUE support ?  ?  ?  ?  ?  ?  ?  ?  ?  ?  ?  ?   ? ?ADL either performed or assessed with clinical judgement  ? ?ADL Overall ADL's : Needs assistance/impaired ?  ?Eating/Feeding Details (indicate cue type and reason): patient requested drink but did not hold cup or spoon to take small sips. no coughing noted. ?  ?  ?  ?  ?  ?  ?  ?  ?  ?  ?  ?  ?  ?  ?  ?  ?  ?  ?  ? ?Extremity/Trunk Assessment   ?  ?  ?  ?  ?  ? ?Vision   ?  ?  ?Perception   ?  ?Praxis   ?  ? ?Cognition Arousal/Alertness: Awake/alert ?Behavior During Therapy: [facility] for tasks assessed/performed ?Overall Cognitive Status: No family/caregiver present to determine baseline cognitive functioning ?  ?  ?  ?  ?  ?  ?  ?  ?  ?  ?  ?  ?  ?  ?  ?  ?  General Comments: Patient with history of dementia. Following 1 step directions and inconsistent with answering orientation questions ?  ?  ?   ?Exercises   ? ?  ?Shoulder Instructions   ? ? ?  ?General Comments    ? ? ?Pertinent Vitals/ Pain       Pain Assessment ?Pain Assessment: Faces ?Faces Pain Scale: Hurts even more ?Pain Location: L hip ?Pain Descriptors / Indicators: Grimacing, Guarding, Moaning ?Pain Intervention(s): Limited activity within patient's tolerance, Monitored during session, Patient requesting pain meds-RN notified, Repositioned ? ?Home Living   ?  ?  ?   ?  ?  ?  ?  ?  ?  ?  ?  ?  ?  ?  ?  ?  ?  ?  ? ?  ?Prior Functioning/Environment    ?  ?  ?  ?   ? ?Frequency ? Min 2X/week  ? ? ? ? ?  ?Progress Toward Goals ? ?OT Goals(current goals can now be found in the care plan section) ? Progress towards OT goals: Progressing toward goals ? ?   ?Plan Discharge plan remains appropriate   ? ?Co-evaluation ? ? ? PT/OT/SLP Co-Evaluation/Treatment: Yes ?Reason for Co-Treatment: For patient/therapist safety;To address functional/ADL transfers ?PT goals addressed during session: Mobility/safety with mobility ?OT goals addressed during session: ADL's and self-care ?  ? ?  ?AM-PAC OT "6 Clicks" Daily Activity     ?Outcome Measure ? ? Help from another person eating meals?: A Little ?Help from another person taking care of personal grooming?: A Little ?Help from another person toileting, which includes using toliet, bedpan, or urinal?: A Lot ?Help from another person bathing (including washing, rinsing, drying)?: A Lot ?Help from another person to put on and taking off regular upper body clothing?: A Lot ?Help from another person to put on and taking off regular lower body clothing?: Total ?6 Click Score: 13 ? ?  ?End of Session Equipment Utilized During Treatment: Oxygen;Rolling walker (2 wheels);Gait belt ? ?OT Visit Diagnosis: Unsteadiness on feet (R26.81);Other abnormalities of gait and mobility (R26.89);Muscle weakness (generalized) (M62.81) ?  ?Activity Tolerance Patient tolerated treatment well ?  ?Patient Left in chair;with call bell/phone within reach;with chair alarm set;with nursing/sitter in room ?  ?Nurse Communication Mobility status;Patient requests pain meds ?  ? ?   ? ?Time: 3888-2800 ?OT Time Calculation (min): 23 min ? ?Charges: OT General Charges ?$OT Visit: 1 Visit ?OT Treatments ?$Therapeutic Activity: 8-22 mins ? ?Zola Runion OTR/L, MS ?Acute Rehabilitation Department ?Office# [phone] ?Pager# [phone] ? ? ?[name] ?03/31/2021, 4:01 PM ?

## 2021-03-31 NOTE — Progress Notes (Signed)
?PROGRESS NOTE ? ? ? ?[name]  BWG:[id number] DOB: April 24, 1931 DOA: 03/25/2021 ?PCP: [name], No Pcp Per (Inactive)  ? ? ? ?Brief Narrative:  ?88 y.o. WM PMHx allergy rhinitis, osteoarthritis, kyphosis, asthmatic bronchitis, CAD, type II DM, stage III CKD, history of DVT, hyperlipidemia, GERD, impaired hearing, motor lithiasis, skin cancer, hypertension, hypothyroidism, macular degeneration, paroxysmal atrial fibrillation, Parkinson's disease, history of unspecified stroke, prostate cancer, PAF, urgency frequency syndrome, urticaria who was just discharged yesterday from the hospital due to viral pneumonia and is being sent from his facility due to dyspnea and decreased oral intake.  EMS stated he was 74% on room air.  The [name] is unable to elaborate further, but answer simple questions.  He denied headache, back, chest or abdominal pain. ? ?ED course: Initial vital signs were temperature 97.5 ?F, pulse 76, respirations 23, BP 163/88 mmHg O2 sat 93% on nasal cannula oxygen.  The [name] received ceftriaxone and azithromycin IVPB. ?  ?Lab work: CBC showed a white count of 23.4 with 92% neutrophils, hemoglobin 12.0 g/dL platelets 337.  Venous blood gas was normal except for a decreased PO2 less than 31 mmHg.  Troponin was 22 ng/L, lactic acid was normal.  Coronavirus and influenza PCR negative.  CMP with normal electrolytes once calcium corrected to albumin.  Glucose 209, BUN 61 and creatinine 2.24 mg/dL.  Albumin was 3.4 g/dL, all other hepatic functions were normal. ?  ?Imaging: One-view portable chest radiograph show bibasilar airspace disease concerning for pneumonia. ? ? ?Subjective: ?3/20 A/O x3 (does not know where).  Requests a hamburger, or grilled cheese sandwich.  Does not like current diet ? ? ?Assessment & Plan: ?Covid vaccination; ?  ?Principal Problem: ?  Acute respiratory failure with hypoxia ([place]) ?Active Problems: ?  Pneumonia ?  Type II diabetes mellitus with renal manifestations ([name]) ?   Benign essential HTN ?  Paroxysmal atrial fibrillation (HCC) ?  Parkinson's disease ([place]) ?  Coronary artery disease involving native coronary artery of native heart without angina pectoris ?  Hypothyroidism ?  Anemia of chronic disease ?  GERD (gastroesophageal reflux disease) ?  Hyperlipidemia LDL goal <70 ?  Physical deconditioning ?  Protein calorie malnutrition ([name]) ?  Aspiration into lower respiratory tract ?  Aphasia due to disease ? ? ? Acute respiratory failure with hypoxia ([place]) ?-In the setting of viral versus bacterial Pneumonia ?-Continuous pulse ox monitoring ?- Titrate O2 to maintain SPO2> 92% ?-DuoNeb TID ?-Complete 5-day course antibiotics ?-Check sputum Gram stain. ?-Check strep pneumoniae urinary antigen. ? ?Aspiration into lower respiratory tract  ?- "pt requires the [facility] to be elevated at least 30 degrees most of the time. and frequent/immediate body changes ?-3/17 [name] failed swallow evaluation: Placed on Dysphagia 2 nectar thick ?-3/20 had long conversation with [name] regarding liberalizing his diet.  [name] states he understands that he will aspirate, at some point developed pneumonia which will most likely cause his death.  However states at almost 89 this does not matter.  Request that I speak with his daughter and get her blessing ? ?DM type II with renal manifestations ([name]) ?-3/16 hemoglobin A1c = 6.4  ?- 3/15 lipid panel pending ?-.3/15 increase moderate SSI ? ?  ?Hyperlipidemia LDL goal <70 ?-3/16 LDL= 140 . ?- [name] allergic to statins, Ezetimibe therefore will not start medication ?  ?Benign Essential HTN/Hypotension ?-Currently hold all BP medication  ?-3/18 Albumin 50 g x 1 ?  ?Paroxysmal atrial fibrillation (HCC) ?-3/15 currently NSR . ?-3/15 continue apixaban 2.5  mg p.o. twice daily. ? ?CAD involving native coronary artery of native heart without angina pectoris ?-See A-fib  ? ?Parkinson's disease ([name]) ?-Continue Sinemet p.o. 3 times daily. ? ?Dementia ?- [name] appears  to have some underlying dementia. ?-3/15 Haldol PRN ?- 3/15 Seroquel 25 mg QHS ? ?Hypothyroidism ?-3/16 TSH =3.66 ?-levothyroxine 150 mcg p.o. daily. ?  ?Anemia of chronic disease ?-Monitor hematocrit hemoglobin. ?Lab Results  ?Component Value Date  ? HGB 7.6 (L) 03/31/2021  ? HGB 7.2 (L) 03/30/2021  ? HGB 6.7 (LL) 03/30/2021  ? HGB 8.3 (L) 03/29/2021  ? HGB 8.4 (L) 03/28/2021  ?-3/19 transfuse 1 unit PRBC ?  ?GERD (gastroesophageal reflux disease) ?pantoprazole 40 mg p.o. daily. ? ?CKD Stage 3B (baseline Cr 2.04) ?Lab Results  ?Component Value Date  ? CREATININE 1.62 (H) 03/31/2021  ? CREATININE 1.69 (H) 03/30/2021  ? CREATININE 1.78 (H) 03/29/2021  ? CREATININE 1.98 (H) 03/28/2021  ? CREATININE 2.20 (H) 03/27/2021  ?-3/18 baseline ? ?Protein calorie malnutrition severe ?-3/16 D5-0.9% saline at 18m/hr ?-3/17 Placed on Dysphagia 2 nectar thick ? ?  ?Physical deconditioning ?Given overall state of health:Consider palliative care evaluation if no improvement. ? ? ? ?Work-up goals of care ?- 3/16 PT/OT consult:Acute respiratory failure with hypoxia, dementia, deconditioning evaluate for SNF. ?-3/17 Palliative Care Consult: Multisystem organ failure with frequent hospital visits.  Evaluate for discharge with palliative care to [name] ?  ? ? ?DVT prophylaxis: Apixaban ?Code Status: DNR ?Family Communication:  ?Status is: Inpatient ? ? ? ?Dispo: The [name] is from: SNF ?             Anticipated d/c is to: SNF ?             Anticipated d/c date is: 3/20 ?             [name] currently stable for discharge ? ? ? ? ? ?Consultants:  ? ? ?Procedures/Significant Events:  ? ? ?I have personally reviewed and interpreted all radiology studies and my findings are as above. ? ?VENTILATOR SETTINGS: ?Room Air 3/20 ?SPO2 96% ? ? ?Cultures ?3/14 blood pending ?3/14 influenza A/B negative ?3/14 SARS coronavirus negative ?3/15 respiratory virus panel negative ? ? ?Antimicrobials: ?Anti-infectives (From  admission, onward)  ? ? Start     Ordered Stop  ? 03/26/21 1100  cefTRIAXone (ROCEPHIN) 2 g in sodium chloride 0.9 % 100 mL IVPB       ? 03/25/21 1306 03/31/21 1059  ? 03/26/21 1100  azithromycin (ZITHROMAX) 500 mg in sodium chloride 0.9 % 250 mL IVPB       ? 03/25/21 1306 03/31/21 1059  ? 03/25/21 1600  Uro-MP 118 MG CAPS 1 capsule  Status:  Discontinued       ? 03/25/21 1305 03/25/21 1354  ? 03/25/21 1600  Urelle (URELLE/URISED) 81 MG tablet 81 mg       ? 03/25/21 1354    ? 03/25/21 1000  cefTRIAXone (ROCEPHIN) 2 g in sodium chloride 0.9 % 100 mL IVPB       ? 03/25/21 0947 03/25/21 1134  ? 03/25/21 1000  azithromycin (ZITHROMAX) 500 mg in sodium chloride 0.9 % 250 mL IVPB       ? 03/25/21 0947 03/25/21 1208  ? ?  ?  ? ? ?Devices ?  ? ?LINES / TUBES:  ? ? ? ? ?Continuous Infusions: ? dextrose 5 % and 0.9% NaCl 75 mL/hr at 03/31/21 1732  ? ? ? ?Objective: ?Vitals:  ?  03/31/21 1358 03/31/21 1407 03/31/21 1733 03/31/21 1946  ?BP: 116/63     ?Pulse: 78     ?Resp: 20     ?Temp: 97.7 ?F (36.5 ?C)     ?TempSrc: Oral     ?SpO2: 100% 95% 96% 92%  ?Weight:      ?Height:      ? ? ?Intake/Output Summary (Last 24 hours) at 03/31/2021 2117 ?Last data filed at 03/31/2021 1755 ?Gross per 24 hour  ?Intake 360 ml  ?Output 1100 ml  ?Net -740 ml  ? ? ?Filed Weights  ? 03/25/21 0951  ?Weight: 68.9 kg  ? ? ?Examination: ? ?General:A/O x3 (does not know where), follows commands. No acute respiratory distress ?Eyes: negative scleral hemorrhage, negative anisocoria, negative icterus ?ENT: Negative Runny nose, negative gingival bleeding, ?Neck:  Negative scars, masses, torticollis, lymphadenopathy, JVD ?Lungs: Clear to auscultation bilaterally without wheezes or crackles ?Cardiovascular: Regular rate and rhythm without murmur gallop or rub normal S1 and S2 ?Abdomen: negative abdominal pain, nondistended, positive soft, bowel sounds, no rebound, no ascites, no appreciable mass ?Extremities: No significant cyanosis, clubbing, or edema bilateral  lower extremities ?Skin: Negative rashes, lesions, ulcers ?Negative depression, negative anxiety, negative fatigue, negative mania  ?Central nervous system:  Cranial nerves II through XII intact, tongue/uv

## 2021-03-31 NOTE — Progress Notes (Signed)
Physical Therapy Treatment ?Patient Details ?Name: [name] ?MRN: [id number] ?DOB: 1931-12-04 ?Today's Date: 03/31/2021 ? ? ?History of Present Illness Pt is a 88yo male returning to hospital 3/14 for hypoxia and difficulty breathing after recent admission 3/9-3/13 for pneumonia.  PMH: Dementia, Parkinson's Disease, DM, HTN, PAF, GERD, hyperlipidemia, CVA, CKD 3, hypertension. ? ?  ?PT Comments  ? ? Max assist of 2 for supine to sit. Pt able to take several steps with RW to the recliner. Pt c/o L hip pain, RN notified.    ?Recommendations for follow up therapy are one component of a multi-disciplinary discharge planning process, led by the attending physician.  Recommendations may be updated based on patient status, additional functional criteria and insurance authorization. ? ?Follow Up Recommendations ? Skilled nursing-short term rehab (<3 hours/day) ?  ?  ?Assistance Recommended at Discharge Frequent or constant Supervision/Assistance  ?Patient can return home with the following A lot of help with bathing/dressing/bathroom;Assistance with cooking/housework;Direct supervision/assist for financial management;Direct supervision/assist for medications management;Help with stairs or ramp for entrance;Assist for transportation;Assistance with feeding;A lot of help with walking and/or transfers ?  ?Equipment Recommendations ? None recommended by PT  ?  ?Recommendations for Other Services   ? ? ?  ?Precautions / Restrictions Precautions ?Precautions: Fall ?Precaution Comments: HOB elevated >30deg ?Restrictions ?Weight Bearing Restrictions: No  ?  ? ?Mobility ? Bed Mobility ?Overal bed mobility: Needs Assistance ?Bed Mobility: Supine to Sit ?  ?  ?Supine to sit: +2 for physical assistance, Max assist ?  ?  ?General bed mobility comments: Max A for trunk support to sit upright ?  ? ?Transfers ?Overall transfer level: Needs assistance ?Equipment used: Rolling walker (2 wheels) ?Transfers: Sit to/from Stand ?Sit to  Stand: Mod assist, +2 physical assistance ?  ?  ?  ?  ?  ?General transfer comment: assist to power up from elevated bed ?  ? ?Ambulation/Gait ?Ambulation/Gait assistance: Min assist, +2 physical assistance ?Gait Distance (Feet): 4 Feet ?Assistive device: Rolling walker (2 wheels) ?Gait Pattern/deviations: Step-to pattern, Decreased stride length ?  ?  ?  ?General Gait Details: pt took several steps to recliner, distance limited by fatigue ? ? ?Stairs ?  ?  ?  ?  ?  ? ? ?Wheelchair Mobility ?  ? ?Modified Rankin (Stroke Patients Only) ?  ? ? ?  ?Balance Overall balance assessment: Needs assistance ?Sitting-balance support: Feet supported ?Sitting balance-Leahy Scale: Fair ?Sitting balance - Comments: initially posterior lean, then fair ?Postural control: Posterior lean ?Standing balance support: Reliant on assistive device for balance, During functional activity ?Standing balance-Leahy Scale: Poor ?Standing balance comment: relies on BUE support ?  ?  ?  ?  ?  ?  ?  ?  ?  ?  ?  ?  ? ?  ?Cognition Arousal/Alertness: Awake/alert ?Behavior During Therapy: [facility] for tasks assessed/performed ?Overall Cognitive Status: No family/caregiver present to determine baseline cognitive functioning ?  ?  ?  ?  ?  ?  ?  ?  ?  ?  ?  ?  ?  ?  ?  ?  ?General Comments: Patient with history of dementia. Following 1 step directions and inconsistent with answering orientation questions ?  ?  ? ?  ?Exercises   ? ?  ?General Comments   ?  ?  ? ?Pertinent Vitals/Pain Pain Assessment ?Faces Pain Scale: Hurts even more ?Pain Location: L hip ?Pain Descriptors / Indicators: Grimacing, Guarding, Moaning ?Pain Intervention(s): Limited activity within patient's tolerance,  Monitored during session, Patient requesting pain meds-RN notified  ? ? ?Home Living   ?  ?  ?  ?  ?  ?  ?  ?  ?  ?   ?  ?Prior Function    ?  ?  ?   ? ?PT Goals (current goals can now be found in the care plan section) Acute Rehab PT Goals ?PT Goal Formulation: Patient unable to  participate in goal setting ?Time For Goal Achievement: 04/04/21 ?Potential to Achieve Goals: Fair ?Progress towards PT goals: Progressing toward goals ? ?  ?Frequency ? ? ? Min 2X/week ? ? ? ?  ?PT Plan Current plan remains appropriate  ? ? ?Co-evaluation PT/OT/SLP Co-Evaluation/Treatment: Yes ?  ?  ?  ?  ? ?  ?AM-PAC PT "6 Clicks" Mobility   ?Outcome Measure ? Help needed turning from your back to your side while in a flat bed without using bedrails?: A Lot ?Help needed moving from lying on your back to sitting on the side of a flat bed without using bedrails?: Total ?Help needed moving to and from a bed to a chair (including a wheelchair)?: A Lot ?Help needed standing up from a chair using your arms (e.g., wheelchair or bedside chair)?: A Lot ?Help needed to walk in hospital room?: A Lot ?Help needed climbing 3-5 steps with a railing? : Total ?6 Click Score: 10 ? ?  ?End of Session Equipment Utilized During Treatment: Gait belt ?Activity Tolerance: Patient limited by fatigue;Patient limited by pain ?Patient left: in chair;with call bell/phone within reach;with chair alarm set ?Nurse Communication: Mobility status ?PT Visit Diagnosis: Other abnormalities of gait and mobility (R26.89);Muscle weakness (generalized) (M62.81);Other symptoms and signs involving the nervous system (R29.898) ?  ? ? ?Time: 4332-9518 ?PT Time Calculation (min) (ACUTE ONLY): 27 min ? ?Charges:  $Therapeutic Activity: 8-22 mins          ?          ? ?Philomena Doheny PT 03/31/2021  ?Acute Rehabilitation Services ?Pager [phone] ?Office [phone] ? ? ?

## 2021-03-31 NOTE — Progress Notes (Signed)
?  ?  Durable Medical Equipment  ?(From admission, onward)  ?  ? ? ?  ? ?  Start     Ordered  ? 03/27/21 1414  For home use only DME Hospital bed  Once       ?Question Answer Comment  ?Length of Need Lifetime   ?The above medical condition requires: Patient requires the ability to reposition frequently   ?Head must be elevated greater than: 30 degrees   ?Bed type Semi-electric   ?Support Surface: Gel Overlay   ?  ? 03/27/21 1414  ? ?  ?  ? ?  ?  ?

## 2021-04-01 ENCOUNTER — Inpatient Hospital Stay (HOSPITAL_COMMUNITY): Payer: Medicare Other

## 2021-04-01 DIAGNOSIS — I2510 Atherosclerotic heart disease of native coronary artery without angina pectoris: Secondary | ICD-10-CM | POA: Diagnosis not present

## 2021-04-01 DIAGNOSIS — I10 Essential (primary) hypertension: Secondary | ICD-10-CM | POA: Diagnosis not present

## 2021-04-01 DIAGNOSIS — D638 Anemia in other chronic diseases classified elsewhere: Secondary | ICD-10-CM | POA: Diagnosis not present

## 2021-04-01 DIAGNOSIS — J9601 Acute respiratory failure with hypoxia: Secondary | ICD-10-CM | POA: Diagnosis not present

## 2021-04-01 LAB — COMPREHENSIVE METABOLIC PANEL
ALT: 6 U/L (ref 0–44)
AST: 16 U/L (ref 15–41)
Albumin: 2.4 g/dL — ABNORMAL LOW (ref 3.5–5.0)
Alkaline Phosphatase: 67 U/L (ref 38–126)
Anion gap: 4 — ABNORMAL LOW (ref 5–15)
BUN: 32 mg/dL — ABNORMAL HIGH (ref 8–23)
CO2: 24 mmol/L (ref 22–32)
Calcium: 7.4 mg/dL — ABNORMAL LOW (ref 8.9–10.3)
Chloride: 112 mmol/L — ABNORMAL HIGH (ref 98–111)
Creatinine, Ser: 1.54 mg/dL — ABNORMAL HIGH (ref 0.61–1.24)
GFR, Estimated: 43 mL/min — ABNORMAL LOW (ref >60)
Glucose, Bld: 140 mg/dL — ABNORMAL HIGH (ref 70–99)
Potassium: 3.4 mmol/L — ABNORMAL LOW (ref 3.5–5.1)
Sodium: 140 mmol/L (ref 135–145)
Total Bilirubin: 0.5 mg/dL (ref 0.3–1.2)
Total Protein: 4.6 g/dL — ABNORMAL LOW (ref 6.5–8.1)

## 2021-04-01 LAB — CBC
HCT: 25.4 % — ABNORMAL LOW (ref 39.0–52.0)
Hemoglobin: 7.6 g/dL — ABNORMAL LOW (ref 13.0–17.0)
MCH: 31 pg (ref 26.0–34.0)
MCHC: 29.9 g/dL — ABNORMAL LOW (ref 30.0–36.0)
MCV: 103.7 fL — ABNORMAL HIGH (ref 80.0–100.0)
Platelets: 246 10*3/uL (ref 150–400)
RBC: 2.45 MIL/uL — ABNORMAL LOW (ref 4.22–5.81)
RDW: 14.4 % (ref 11.5–15.5)
WBC: 12.1 10*3/uL — ABNORMAL HIGH (ref 4.0–10.5)
nRBC: 0 % (ref 0.0–0.2)

## 2021-04-01 LAB — GLUCOSE, CAPILLARY
Glucose-Capillary: 110 mg/dL — ABNORMAL HIGH (ref 70–99)
Glucose-Capillary: 117 mg/dL — ABNORMAL HIGH (ref 70–99)
Glucose-Capillary: 134 mg/dL — ABNORMAL HIGH (ref 70–99)
Glucose-Capillary: 140 mg/dL — ABNORMAL HIGH (ref 70–99)
Glucose-Capillary: 182 mg/dL — ABNORMAL HIGH (ref 70–99)
Glucose-Capillary: 183 mg/dL — ABNORMAL HIGH (ref 70–99)

## 2021-04-01 LAB — PHOSPHORUS: Phosphorus: 2.2 mg/dL — ABNORMAL LOW (ref 2.5–4.6)

## 2021-04-01 LAB — MAGNESIUM: Magnesium: 2 mg/dL (ref 1.7–2.4)

## 2021-04-01 MED ORDER — LIDOCAINE 5 % EX PTCH
2.0000 | MEDICATED_PATCH | CUTANEOUS | Status: AC
  Administered 2021-04-01: 2 via TRANSDERMAL
  Filled 2021-04-01: qty 2

## 2021-04-01 MED ORDER — POTASSIUM PHOSPHATES 15 MMOLE/5ML IV SOLN
30.0000 mmol | Freq: Once | INTRAVENOUS | Status: AC
  Administered 2021-04-01: 30 mmol via INTRAVENOUS
  Filled 2021-04-01: qty 10

## 2021-04-01 NOTE — Progress Notes (Signed)
Pt very confused and agitated. Pulled off safety mitts and primofit, attempting to climb OOB.  Haldol given IV '2mg'$ . Report given to night shift RN. Attempted to reorient patient, unsuccessful. Emotional support given as much as possible. Night shift will monitor.  Coolidge Breeze, RN 04/01/2021 ? ?

## 2021-04-01 NOTE — Progress Notes (Signed)
Attempted to administer medications at this time. Pt became very agitated and refused adamantly to take PO meds or eyedrops. Will attempt again later if possible. Coolidge Breeze, RN 04/01/2021 ? ?

## 2021-04-01 NOTE — Progress Notes (Signed)
?PROGRESS NOTE ? ? ? ?[name]  OIZ:[id number] DOB: April 19, 1931 DOA: 03/25/2021 ?PCP: Patient, No Pcp Per (Inactive)  ? ? ? ?Brief Narrative:  ?88 y.o. WM PMHx allergy rhinitis, osteoarthritis, kyphosis, asthmatic bronchitis, CAD, type II DM, stage III CKD, history of DVT, hyperlipidemia, GERD, impaired hearing, motor lithiasis, skin cancer, hypertension, hypothyroidism, macular degeneration, paroxysmal atrial fibrillation, Parkinson's disease, history of unspecified stroke, prostate cancer, PAF, urgency frequency syndrome, urticaria who was just discharged yesterday from the hospital due to viral pneumonia and is being sent from his facility due to dyspnea and decreased oral intake.  EMS stated he was 74% on room air.  The patient is unable to elaborate further, but answer simple questions.  He denied headache, back, chest or abdominal pain. ? ?ED course: Initial vital signs were temperature 97.5 ?F, pulse 76, respirations 23, BP 163/88 mmHg O2 sat 93% on nasal cannula oxygen.  The patient received ceftriaxone and azithromycin IVPB. ?  ?Lab work: CBC showed a white count of 23.4 with 92% neutrophils, hemoglobin 12.0 g/dL platelets 337.  Venous blood gas was normal except for a decreased PO2 less than 31 mmHg.  Troponin was 22 ng/L, lactic acid was normal.  Coronavirus and influenza PCR negative.  CMP with normal electrolytes once calcium corrected to albumin.  Glucose 209, BUN 61 and creatinine 2.24 mg/dL.  Albumin was 3.4 g/dL, all other hepatic functions were normal. ?  ?Imaging: One-view portable chest radiograph show bibasilar airspace disease concerning for pneumonia. ? ? ?Subjective: ?3/21 A/O (does not know where), complains of bilateral left thigh pain.Requests a hamburger, or grilled cheese sandwich.  Does not like current diet ? ? ?Assessment & Plan: ?Covid vaccination; ?  ?Principal Problem: ?  Acute respiratory failure with hypoxia ([name]) ?Active Problems: ?  Pneumonia ?  Type II diabetes mellitus  with renal manifestations ([name]) ?  Benign essential HTN ?  Paroxysmal atrial fibrillation (HCC) ?  Parkinson's disease ([name]) ?  Coronary artery disease involving native coronary artery of native heart without angina pectoris ?  Hypothyroidism ?  Anemia of chronic disease ?  GERD (gastroesophageal reflux disease) ?  Hyperlipidemia LDL goal <70 ?  Physical deconditioning ?  Protein calorie malnutrition ([name]) ?  Aspiration into lower respiratory tract ?  Aphasia due to disease ? ? ? Acute respiratory failure with hypoxia ([name]) ?-In the setting of viral versus bacterial Pneumonia ?-Continuous pulse ox monitoring ?- Titrate O2 to maintain SPO2> 92% ?-DuoNeb TID ?-Complete 5-day course antibiotics ?-Check sputum Gram stain. ?-Check strep pneumoniae urinary antigen. ? ?Aspiration into lower respiratory tract  ?- "pt requires the [facility] to be elevated at least 30 degrees most of the time. and frequent/immediate body changes ?-3/17 patient failed swallow evaluation: Placed on Dysphagia 2 nectar thick ?-3/20 had long conversation with patient regarding liberalizing his diet.  Patient states he understands that he will aspirate, at some point developed pneumonia which will most likely cause his death.  However states at almost 89 this does not matter.  Request that I speak with his daughter and get her blessing ? ?DM type II with renal manifestations ([name]) ?-3/16 hemoglobin A1c = 6.4  ?- 3/15 lipid panel pending ?-.3/15 increase moderate SSI ?  ?Hyperlipidemia LDL goal <70 ?-3/16 LDL= 140 . ?- Patient allergic to statins, Ezetimibe therefore will not start medication ?  ?Benign Essential HTN/Hypotension ?-Currently hold all BP medication  ?-3/18 Albumin 50 g x 1 ?  ?Paroxysmal atrial fibrillation (HCC) ?-3/15 currently NSR . ?-3/15 continue  apixaban 2.5 mg p.o. twice daily. ? ?CAD involving native coronary artery of native heart without angina pectoris ?-See A-fib  ? ?Parkinson's disease ([name]) ?-Continue Sinemet p.o. 3 times  daily. ? ?Dementia ?- Patient appears to have some underlying dementia. ?-3/15 Haldol PRN ?- 3/15 Seroquel 25 mg QHS ? ?Hypothyroidism ?-3/16 TSH =3.66 ?-levothyroxine 150 mcg p.o. daily. ?  ?Anemia of chronic disease ?-Monitor hematocrit hemoglobin. ?Lab Results  ?Component Value Date  ? HGB 7.6 (L) 03/31/2021  ? HGB 7.2 (L) 03/30/2021  ? HGB 6.7 (LL) 03/30/2021  ? HGB 8.3 (L) 03/29/2021  ? HGB 8.4 (L) 03/28/2021  ?-3/19 transfuse 1 unit PRBC ?  ?GERD (gastroesophageal reflux disease) ?pantoprazole 40 mg p.o. daily. ? ?CKD Stage 3B (baseline Cr 2.04) ?Lab Results  ?Component Value Date  ? CREATININE 1.62 (H) 03/31/2021  ? CREATININE 1.69 (H) 03/30/2021  ? CREATININE 1.78 (H) 03/29/2021  ? CREATININE 1.98 (H) 03/28/2021  ? CREATININE 2.20 (H) 03/27/2021  ?-3/18 baseline ? ?Protein calorie malnutrition severe ?-3/16 D5-0.9% saline at 29m/hr ?-3/17 Placed on Dysphagia 2 nectar thick ? ?Thigh pain bilateral ?-3/21 prior to admission patient had a fall will obtain bilateral hip and thigh x-ray ? ?Hypokalemia ?-Potassium goal> 4 ?- K-Phos IV 30 mmol ? ?Hypophosphatemia ?- Phosphorus goal> 2.5 ?- See hypokalemia ? ? ?  ?Physical deconditioning ?Given overall state of health:Consider palliative care evaluation if no improvement. ? ? ? ?Work-up goals of care ?- 3/16 PT/OT consult:Acute respiratory failure with hypoxia, dementia, deconditioning evaluate for SNF. ?-3/17 Palliative Care Consult: Multisystem organ failure with frequent hospital visits.  [facility] ?  ? ? ?DVT prophylaxis: Apixaban ?Code Status: DNR ?Family Communication:  ?Status is: Inpatient ? ? ? ?Dispo: The patient is from: SNF ?             Anticipated d/c is to: SNF ?             Anticipated d/c date is: 3/20 ?             Patient currently stable for discharge ? ? ? ? ? ?Consultants:  ? ? ?Procedures/Significant Events:  ? ? ?I have personally reviewed and interpreted all radiology  studies and my findings are as above. ? ?VENTILATOR SETTINGS: ?Room Air 3/21 ?SPO2 96% ? ? ?Cultures ?3/14 blood LEFT wrist negative ?3/14 blood RIGHT wrist negative ?3/14 influenza A/B negative ?3/14 SARS coronavirus negative ?3/15 respiratory virus panel negative ? ? ?Antimicrobials: ?Anti-infectives (From admission, onward)  ? ? Start     Ordered Stop  ? 03/26/21 1100  cefTRIAXone (ROCEPHIN) 2 g in sodium chloride 0.9 % 100 mL IVPB       ? 03/25/21 1306 03/31/21 1059  ? 03/26/21 1100  azithromycin (ZITHROMAX) 500 mg in sodium chloride 0.9 % 250 mL IVPB       ? 03/25/21 1306 03/31/21 1059  ? 03/25/21 1600  Uro-MP 118 MG CAPS 1 capsule  Status:  Discontinued       ? 03/25/21 1305 03/25/21 1354  ? 03/25/21 1600  Urelle (URELLE/URISED) 81 MG tablet 81 mg       ? 03/25/21 1354    ? 03/25/21 1000  cefTRIAXone (ROCEPHIN) 2 g in sodium chloride 0.9 % 100 mL IVPB       ? 03/25/21 0947 03/25/21 1134  ? 03/25/21 1000  azithromycin (ZITHROMAX) 500 mg in sodium chloride 0.9 % 250 mL IVPB       ?  03/25/21 0947 03/25/21 1208  ? ?  ?  ? ? ?Devices ?  ? ?LINES / TUBES:  ? ? ? ? ?Continuous Infusions: ? dextrose 5 % and 0.9% NaCl 75 mL/hr at 04/01/21 5465  ? ? ? ?Objective: ?Vitals:  ? 03/31/21 1946 03/31/21 2348 04/01/21 0501 04/01/21 0354  ?BP:  121/72 (!) 176/89   ?Pulse:  77 79   ?Resp:  20 17   ?Temp:  98.3 ?F (36.8 ?C) 98.3 ?F (36.8 ?C)   ?TempSrc:  Oral Oral   ?SpO2: 92% 96% 100% 93%  ?Weight:      ?Height:      ? ? ?Intake/Output Summary (Last 24 hours) at 04/01/2021 1129 ?Last data filed at 04/01/2021 0900 ?Gross per 24 hour  ?Intake 60 ml  ?Output 1000 ml  ?Net -940 ml  ? ? ?Filed Weights  ? 03/25/21 0951  ?Weight: 68.9 kg  ? ? ?Examination: ? ?General:A/O x3 (does not know where), follows commands. No acute respiratory distress ?Eyes: negative scleral hemorrhage, negative anisocoria, negative icterus ?ENT: Negative Runny nose, negative gingival bleeding, ?Neck:  Negative scars, masses, torticollis, lymphadenopathy,  JVD ?Lungs: Clear to auscultation bilaterally without wheezes or crackles ?Cardiovascular: Regular rate and rhythm without murmur gallop or rub normal S1 and S2 ?Abdomen: negative abdominal pain, nondistended, po

## 2021-04-02 ENCOUNTER — Inpatient Hospital Stay (HOSPITAL_COMMUNITY): Payer: Medicare Other

## 2021-04-02 DIAGNOSIS — E43 Unspecified severe protein-calorie malnutrition: Secondary | ICD-10-CM | POA: Insufficient documentation

## 2021-04-02 DIAGNOSIS — J9601 Acute respiratory failure with hypoxia: Secondary | ICD-10-CM | POA: Diagnosis not present

## 2021-04-02 LAB — CBC
HCT: 26.1 % — ABNORMAL LOW (ref 39.0–52.0)
Hemoglobin: 8.1 g/dL — ABNORMAL LOW (ref 13.0–17.0)
MCH: 30.7 pg (ref 26.0–34.0)
MCHC: 31 g/dL (ref 30.0–36.0)
MCV: 98.9 fL (ref 80.0–100.0)
Platelets: 260 10*3/uL (ref 150–400)
RBC: 2.64 MIL/uL — ABNORMAL LOW (ref 4.22–5.81)
RDW: 14.1 % (ref 11.5–15.5)
WBC: 11.4 10*3/uL — ABNORMAL HIGH (ref 4.0–10.5)
nRBC: 0 % (ref 0.0–0.2)

## 2021-04-02 LAB — PHOSPHORUS: Phosphorus: 3.6 mg/dL (ref 2.5–4.6)

## 2021-04-02 LAB — GLUCOSE, CAPILLARY
Glucose-Capillary: 173 mg/dL — ABNORMAL HIGH (ref 70–99)
Glucose-Capillary: 168 mg/dL — ABNORMAL HIGH (ref 70–99)
Glucose-Capillary: 154 mg/dL — ABNORMAL HIGH (ref 70–99)
Glucose-Capillary: 122 mg/dL — ABNORMAL HIGH (ref 70–99)
Glucose-Capillary: 75 mg/dL (ref 70–99)

## 2021-04-02 LAB — COMPREHENSIVE METABOLIC PANEL
ALT: 8 U/L (ref 0–44)
AST: 13 U/L — ABNORMAL LOW (ref 15–41)
Albumin: 2.7 g/dL — ABNORMAL LOW (ref 3.5–5.0)
Alkaline Phosphatase: 76 U/L (ref 38–126)
Anion gap: 8 (ref 5–15)
BUN: 25 mg/dL — ABNORMAL HIGH (ref 8–23)
CO2: 21 mmol/L — ABNORMAL LOW (ref 22–32)
Calcium: 7.5 mg/dL — ABNORMAL LOW (ref 8.9–10.3)
Chloride: 108 mmol/L (ref 98–111)
Creatinine, Ser: 1.44 mg/dL — ABNORMAL HIGH (ref 0.61–1.24)
GFR, Estimated: 47 mL/min — ABNORMAL LOW (ref >60)
Glucose, Bld: 181 mg/dL — ABNORMAL HIGH (ref 70–99)
Potassium: 4 mmol/L (ref 3.5–5.1)
Sodium: 137 mmol/L (ref 135–145)
Total Bilirubin: 0.6 mg/dL (ref 0.3–1.2)
Total Protein: 5 g/dL — ABNORMAL LOW (ref 6.5–8.1)

## 2021-04-02 LAB — MAGNESIUM: Magnesium: 1.9 mg/dL (ref 1.7–2.4)

## 2021-04-02 MED ORDER — MORPHINE SULFATE (PF) 2 MG/ML IV SOLN
1.0000 mg | Freq: Once | INTRAVENOUS | Status: AC
  Administered 2021-04-02: 1 mg via INTRAVENOUS
  Filled 2021-04-02: qty 1

## 2021-04-02 MED ORDER — GLYCERIN (LAXATIVE) 2 G RE SUPP
1.0000 | RECTAL | Status: DC | PRN
  Administered 2021-04-02 – 2021-04-03 (×2): 1 via RECTAL
  Filled 2021-04-02 (×3): qty 1

## 2021-04-02 MED ORDER — POLYETHYLENE GLYCOL 3350 17 G PO PACK
17.0000 g | PACK | Freq: Every day | ORAL | Status: DC
  Administered 2021-04-02 – 2021-04-04 (×3): 17 g via ORAL
  Filled 2021-04-02 (×3): qty 1

## 2021-04-02 MED ORDER — ACETAMINOPHEN 10 MG/ML IV SOLN: 1000.0000 mg | Freq: Once | INTRAVENOUS | Status: DC

## 2021-04-02 MED ORDER — LACTULOSE 10 GM/15ML PO SOLN
10.0000 g | Freq: Two times a day (BID) | ORAL | Status: DC | PRN
  Administered 2021-04-03: 10 g via ORAL
  Filled 2021-04-02: qty 30

## 2021-04-02 MED ORDER — ALBUTEROL SULFATE (2.5 MG/3ML) 0.083% IN NEBU: 2.5000 mg | INHALATION_SOLUTION | Freq: Four times a day (QID) | RESPIRATORY_TRACT | Status: DC | PRN

## 2021-04-02 MED ORDER — TRAMADOL HCL 50 MG PO TABS
50.0000 mg | ORAL_TABLET | Freq: Two times a day (BID) | ORAL | Status: DC | PRN
  Administered 2021-04-02 – 2021-04-03 (×3): 50 mg via ORAL
  Filled 2021-04-02 (×4): qty 1

## 2021-04-02 MED ORDER — INSULIN ASPART 100 UNIT/ML IJ SOLN
0.0000 [IU] | Freq: Three times a day (TID) | INTRAMUSCULAR | Status: DC
  Administered 2021-04-02 – 2021-04-04 (×4): 2 [IU] via SUBCUTANEOUS

## 2021-04-02 MED ORDER — FENTANYL CITRATE PF 50 MCG/ML IJ SOSY
25.0000 ug | PREFILLED_SYRINGE | Freq: Once | INTRAMUSCULAR | Status: AC
  Administered 2021-04-02: 25 ug via INTRAVENOUS
  Filled 2021-04-02: qty 1

## 2021-04-02 NOTE — Progress Notes (Signed)
Tap water enema administered per MD order.  Small amount of soft stool moved immediately following. ? ?Angie Fava, RN  ?

## 2021-04-02 NOTE — Progress Notes (Signed)
Speech Language Pathology Treatment: Dysphagia  ?Patient Details ?Name: [name] ?MRN: [id number] ?DOB: 09-07-1931 ?Today's Date: 04/02/2021 ?Time: 1030-1043 ?SLP Time Calculation (min) (ACUTE ONLY): 13 min ? ?Assessment / Plan / Recommendation ?Clinical Impression ? Pt easily awoken and agreeable to nectar thick Sprite which he consumed without s/sx aspiration and controlled cup sips. He reported not liking his food and would prefer if drinks not thick but did state he liked the nectar thickened lemon-lime soda. Trial of upgraded Dys 3 texture with effective mastication and independently checking and removing oral residue. Recommend upgrade to Dys 3 and continue small cup sips nectar thick soda. Palliative care at discharge may be beneficial given progressive dysphagia and recurrent pna. Will follow.  ?  ?HPI HPI: Pt is an 88yo male returning to hospital 3/14 for hypoxia and difficulty breathing after recent admission 3/9-3/13 for pneumonia.  CXR 3/14: Bibasilar airspace disease concerning for pneumonia. Pt is resident of [facility], with hx of CVA, prosCa s/p radX, Parkinsons disease, CAD s/p STEMI 2019 Jul, hypothyroidism, HTN, DM, and Afib, GERD. Hx of dysphagia with two prior MBS studies, 07/20/16 and 04/25/19. The latter revealed "sensorimotor deficits marked by bolus incohesion, spillage of thin and nectar liquids to the pyriforms before the pharyngeal swallow is triggered, and aspiration of thin liquids during and after the swallow.  Barium fills the pyrforms and then tends to spill into the larynx.  Laryngeal vestibule closure is inconsistent.  Thin liquid aspiration elicited an intermittent cough response, but often aspiration was silent.  A chin tuck did not help to protect the airway.  Nectar liquids were not aspirated during this exam. Primary deficits appeared to be decreased pharyngeal squeeze and impaired timing of laryngeal vestibule closure." Bedside/clinical swallow evaluation completed on  3/17 recommended Dys 2 solids, nectar thick liquids. ?  ?   ?SLP Plan ? Continue with current plan of care ? ?  ?  ?Recommendations for follow up therapy are one component of a multi-disciplinary discharge planning process, led by the attending physician.  Recommendations may be updated based on patient status, additional functional criteria and insurance authorization. ?  ? ?Recommendations  ?Diet recommendations: Dysphagia 3 (mechanical soft);Nectar-thick liquid ?Liquids provided via: Cup;No straw ?Medication Administration: Crushed with puree ?Supervision: Full supervision/cueing for compensatory strategies;Staff to assist with self feeding ?Compensations: Slow rate;Small sips/bites ?Postural Changes and/or Swallow Maneuvers: Seated upright 90 degrees  ?   ?    ?   ? ? ? ? Oral Care Recommendations: Oral care BID ?Assistance recommended at discharge: Frequent or constant Supervision/Assistance ?SLP Visit Diagnosis: Dysphagia, oropharyngeal phase (R13.12) ?Plan: Continue with current plan of care ? ? ? ? ?  ?  ? ? ?[name] ? ?04/02/2021, 10:50 AM ?

## 2021-04-02 NOTE — Progress Notes (Signed)
Patient AAO to self and place today.  Fed himself 25% of lunch with set up assist.  Able to drink full dose Miralax, so BM yet today.  Last charted was 3/12.  C/o of intermittent pain "at the top of [his] legs."  MD notified, Tramadol ordered and administered. ? ?Will continue to monitor. ? ?Angie Fava, RN  ?

## 2021-04-02 NOTE — Progress Notes (Signed)
Occupational Therapy Treatment ?Patient Details ?Name: [name] ?MRN: [id number] ?DOB: 09-Mar-1931 ?Today's Date: 04/02/2021 ? ? ?History of present illness Pt is a 88yo male returning to hospital 3/14 for hypoxia and difficulty breathing after recent admission 3/9-3/13 for pneumonia.  PMH: Dementia, Parkinson's Disease, DM, HTN, PAF, GERD, hyperlipidemia, CVA, CKD 3, hypertension. ?  ?OT comments ? Patient was mod A +2 for sit to stand and once in standing min A +2 to transfer with RW to [facility] with increased time. Patient was motivated to attempt to use bathroom on this date. Patient was noted to be in pleasant mood during session Patient would continue to benefit from skilled OT services at this time while admitted and after d/c to address noted deficits in order to improve overall safety and independence in ADLs.  ?  ? ?Recommendations for follow up therapy are one component of a multi-disciplinary discharge planning process, led by the attending physician.  Recommendations may be updated based on patient status, additional functional criteria and insurance authorization. ?   ?Follow Up Recommendations ? Skilled nursing-short term rehab (<3 hours/day)  ?  ?Assistance Recommended at Discharge Frequent or constant Supervision/Assistance  ?Patient can return home with the following ? A lot of help with walking and/or transfers;A lot of help with bathing/dressing/bathroom;Assistance with cooking/housework;Direct supervision/assist for medications management;Direct supervision/assist for financial management;Assist for transportation;Help with stairs or ramp for entrance ?  ?Equipment Recommendations ?    ?  ?Recommendations for Other Services   ? ?  ?Precautions / Restrictions Precautions ?Precautions: Fall ?Precaution Comments: HOB elevated >30deg ?Restrictions ?Weight Bearing Restrictions: No  ? ? ?  ? ?Mobility Bed Mobility ?Overal bed mobility: Needs Assistance ?Bed Mobility: Supine to Sit ?  ?  ?Supine to sit:  +2 for physical assistance, Max assist ?  ?  ?General bed mobility comments: Max A for trunk support to sit upright with strong posterior leaning during inital sitting ?  ? ?Transfers ?  ?  ?  ?  ?  ?  ?  ?  ?  ?  ?  ?  ?Balance Overall balance assessment: Needs assistance ?Sitting-balance support: Feet supported ?Sitting balance-Leahy Scale: Fair ?Sitting balance - Comments: initially posterior lean, then fair ?Postural control: Posterior lean ?Standing balance support: Reliant on assistive device for balance, During functional activity ?Standing balance-Leahy Scale: Poor ?Standing balance comment: relies on BUE support ?  ?  ?  ?  ?  ?  ?  ?  ?  ?  ?  ?   ? ?ADL either performed or assessed with clinical judgement  ? ?ADL Overall ADL's : Needs assistance/impaired ?  ?  ?  ?  ?  ?  ?  ?  ?  ?  ?  ?  ?Toilet Transfer: +2 for physical assistance;+2 for safety/equipment;Moderate assistance ?Toilet Transfer Details (indicate cue type and reason): patient was mod A +2 for sit to stand and then min A +2 for transfer with RW. ?Toileting- Water quality scientist and Hygiene: Sit to/from stand;Total assistance ?Toileting - Clothing Manipulation Details (indicate cue type and reason): patient is max A for hygiene tasks with patient reliant on RW for balance unable to asssit ?  ?  ?Functional mobility during ADLs: +2 for physical assistance;+2 for safety/equipment;Minimal assistance ?  ?  ? ?Extremity/Trunk Assessment   ?  ?  ?  ?  ?  ? ?Vision   ?  ?  ?Perception   ?  ?Praxis   ?  ? ?Cognition  Arousal/Alertness: Awake/alert ?Behavior During Therapy: [facility] for tasks assessed/performed ?Overall Cognitive Status: No family/caregiver present to determine baseline cognitive functioning ?  ?  ?  ?  ?  ?  ?  ?  ?  ?  ?  ?  ?  ?  ?  ?  ?General Comments: patient was noted to have smiles and eager to get on commode today ?  ?  ?   ?Exercises   ? ?  ?Shoulder Instructions   ? ? ?  ?General Comments    ? ? ?Pertinent Vitals/ Pain        Pain Assessment ?Pain Assessment: Faces ?Faces Pain Scale: Hurts a little bit ?Pain Location: L hip ?Pain Descriptors / Indicators: Grimacing, Guarding ?Pain Intervention(s): Limited activity within patient's tolerance, Monitored during session ? ?Home Living   ?  ?  ?  ?  ?  ?  ?  ?  ?  ?  ?  ?  ?  ?  ?  ?  ?  ?  ? ?  ?Prior Functioning/Environment    ?  ?  ?  ?   ? ?Frequency ? Min 2X/week  ? ? ? ? ?  ?Progress Toward Goals ? ?OT Goals(current goals can now be found in the care plan section) ? Progress towards OT goals: Progressing toward goals ? ?   ?Plan Discharge plan remains appropriate   ? ?Co-evaluation ? ? ?   ?  ?  ?  ?  ? ?  ?AM-PAC OT "6 Clicks" Daily Activity     ?Outcome Measure ? ? Help from another person eating meals?: A Little ?Help from another person taking care of personal grooming?: A Little ?Help from another person toileting, which includes using toliet, bedpan, or urinal?: A Lot ?Help from another person bathing (including washing, rinsing, drying)?: A Lot ?Help from another person to put on and taking off regular upper body clothing?: A Lot ?Help from another person to put on and taking off regular lower body clothing?: Total ?6 Click Score: 13 ? ?  ?End of Session Equipment Utilized During Treatment: Rolling walker (2 wheels);Gait belt ? ?OT Visit Diagnosis: Unsteadiness on feet (R26.81);Other abnormalities of gait and mobility (R26.89);Muscle weakness (generalized) (M62.81) ?  ?Activity Tolerance Patient tolerated treatment well ?  ?Patient Left in chair;with call bell/phone within reach;with chair alarm set ?  ?Nurse Communication Other (comment) (ok to see patient. sent message after session) ?  ? ?   ? ?Time: 3016-0109 ?OT Time Calculation (min): 25 min ? ?Charges: OT General Charges ?$OT Visit: 1 Visit ?OT Treatments ?$Self Care/Home Management : 23-37 mins ? ?[name], MS ?Acute Rehabilitation Department ?Office# [phone] ?Pager# [phone] ? ? ?[name] ?04/02/2021,  2:11 PM ?

## 2021-04-02 NOTE — Progress Notes (Signed)
?PROGRESS NOTE ? ? ? ?[name]  XBD:[id number] DOB: [birth year] DOA: 03/25/2021 ?PCP: Patient, No Pcp Per (Inactive)  ? ?  ?Brief Narrative:  ?[name] is an 88 y.o. male with PMHx allergy rhinitis, osteoarthritis, kyphosis, asthmatic bronchitis, CAD, type II DM, stage III CKD, history of DVT, hyperlipidemia, GERD, impaired hearing, motor lithiasis, skin cancer, hypertension, hypothyroidism, macular degeneration, paroxysmal atrial fibrillation, Parkinson's disease, history of unspecified stroke, prostate cancer, PAF, urgency frequency syndrome, urticaria who was just discharged 03/24/21 from the hospital due to viral pneumonia. He now returns from his ALF due to dyspnea and decreased oral intake.  EMS stated he was 74% on room air.  He was started on azithromycin and Rocephin and admitted to the hospital. ? ?New events last 24 hours / Subjective: ?Poor historian overall, no new complaints. ? ?Assessment & Plan: ?  ?Principal Problem: ?  Acute respiratory failure with hypoxia ([place]) ?Active Problems: ?  Pneumonia ?  Type II diabetes mellitus with renal manifestations ([place]) ?  Benign essential HTN ?  Paroxysmal atrial fibrillation (H[facility]) ?  Parkinson's disease ([place]) ?  Coronary artery disease involving native coronary artery of native heart without angina pectoris ?  Hypothyroidism ?  Anemia of chronic disease ?  GERD (gastroesophageal reflux disease) ?  Hyperlipidemia LDL goal <70 ?  Physical deconditioning ?  Protein calorie malnutrition ([place]) ?  Aspiration into lower respiratory tract ?  Aphasia due to disease ?  Protein-calorie malnutrition, severe ? ? ?Acute hypoxemic respiratory failure secondary to pneumonia, HCAP, aspiration ?-Respiratory viral panel negative, COVID and influenza negative ?-Completed 5-day course of antibiotics ?-Remains on dysphagia diet.  Patient understood risk of liberalizing his diet ?-On room air this morning ? ?Constipation ?-Per report has not had a bowel movement in 10 days.   Added MiraLAX and suppository.  Can try enema if no results ? ?AKI on CKD stage IIIa ?-Baseline creatinine 1.7-2 ?-Resolved ? ?Diabetes mellitus, well controlled ?-A1c 6.4 ?-Sliding scale insulin ? ?Paroxysmal atrial fibrillation ?-Eliquis ? ?Hyperlipidemia ?-Intolerant to statins ? ?Hypertension ?-Antihypertensives on hold due to hypotension ? ?Parkinson's disease ?-Sinemet ? ?Dementia ?-Seroquel nightly ? ?Hypothyroidism ?-Synthroid ? ?GERD ?-PPI ? ?DVT prophylaxis:  ?apixaban (ELIQUIS) tablet 2.5 mg Start: 03/25/21 1315 ?apixaban (ELIQUIS) tablet 2.5 mg  ?Code Status: DNR ?Family Communication: No family at bedside ?Disposition Plan:  ?Status is: Inpatient ?Remains inpatient appropriate because: Needs to have a bowel movement today.  Return back to ALF tomorrow ? ? ?Antimicrobials:  ?Anti-infectives (From admission, onward)  ? ? Start     Dose/Rate Route Frequency Ordered Stop  ? 03/29/21 1245  azithromycin (ZITHROMAX) 500 mg in sodium chloride 0.9 % 250 mL IVPB       ? 500 mg ?250 mL/hr over 60 Minutes Intravenous Every 24 hours 03/29/21 1153 03/30/21 1436  ? 03/29/21 1230  azithromycin (ZITHROMAX) tablet 500 mg  Status:  Discontinued       ? 500 mg Oral Daily 03/29/21 1138 03/29/21 1153  ? 03/26/21 1100  cefTRIAXone (ROCEPHIN) 2 g in sodium chloride 0.9 % 100 mL IVPB       ? 2 g ?200 mL/hr over 30 Minutes Intravenous Every 24 hours 03/25/21 1306 03/30/21 1244  ? 03/26/21 1100  azithromycin (ZITHROMAX) 500 mg in sodium chloride 0.9 % 250 mL IVPB  Status:  Discontinued       ? 500 mg ?250 mL/hr over 60 Minutes Intravenous Every 24 hours 03/25/21 1306 03/29/21 1138  ? 03/25/21 1600  Uro-MP 118  MG CAPS 1 capsule  Status:  Discontinued       ? 1 capsule Oral 3 times daily 03/25/21 1305 03/25/21 1354  ? 03/25/21 1600  Urelle (URELLE/URISED) 81 MG tablet 81 mg       ? 1 tablet Oral 3 times daily 03/25/21 1354    ? 03/25/21 1000  cefTRIAXone (ROCEPHIN) 2 g in sodium chloride 0.9 % 100 mL IVPB       ? 2 g ?200 mL/hr  over 30 Minutes Intravenous  Once 03/25/21 0947 03/25/21 1134  ? 03/25/21 1000  azithromycin (ZITHROMAX) 500 mg in sodium chloride 0.9 % 250 mL IVPB       ? 500 mg ?250 mL/hr over 60 Minutes Intravenous  Once 03/25/21 0947 03/25/21 1208  ? ?  ? ? ? ?Objective: ?Vitals:  ? 04/01/21 2218 04/02/21 3235 04/02/21 0838 04/02/21 1230  ?BP: 135/81 (!) 160/97  (!) 146/74  ?Pulse: 89 81  75  ?Resp: (!) 22 20 (!) 32 20  ?Temp: 98.9 ?F (37.2 ?C) 99.4 ?F (37.4 ?C)  98.1 ?F (36.7 ?C)  ?TempSrc: Oral Oral  Oral  ?SpO2: 96% 96% 96% 97%  ?Weight:      ?Height:      ? ? ?Intake/Output Summary (Last 24 hours) at 04/02/2021 1404 ?Last data filed at 04/02/2021 1320 ?Gross per 24 hour  ?Intake 975 ml  ?Output 1425 ml  ?Net -450 ml  ? ?Filed Weights  ? 03/25/21 0951  ?Weight: 68.9 kg  ? ? ?Examination:  ?General exam: Appears calm and comfortable  ?Respiratory system: Clear to auscultation. Respiratory effort normal. No respiratory distress.  On room air ?Cardiovascular system: S1 & S2 heard, RRR. No murmurs. No pedal edema. ?Gastrointestinal system: Abdomen is nondistended, soft and nontender. Normal bowel sounds heard. ?Central nervous system: Alert  ?Skin: No rashes, lesions or ulcers on exposed skin  ? ?Data Reviewed: I have personally reviewed following labs and imaging studies ? ?CBC: ?Recent Labs  ?Lab 03/27/21 ?0611 03/28/21 ?5732 03/29/21 ?2025 03/30/21 ?0352 03/30/21 ?1448 03/31/21 ?4270 04/01/21 ?6237 04/02/21 ?6283  ?WBC 20.3* 15.1* 13.6* 10.2  --  10.8* 12.1* 11.4*  ?NEUTROABS 17.9* 13.1* 11.5* 7.9*  --  8.6*  --   --   ?HGB 10.0* 8.4* 8.3* 6.7* 7.2* 7.6* 7.6* 8.1*  ?HCT 31.0* 26.7* 27.8* 21.6* 23.3* 24.4* 25.4* 26.1*  ?MCV 99.4 100.4* 106.1* 102.4*  --  100.4* 103.7* 98.9  ?PLT 290 282 252 245  --  224 246 260  ? ?Basic Metabolic Panel: ?Recent Labs  ?Lab 03/29/21 ?0333 03/30/21 ?0352 03/31/21 ?1517 04/01/21 ?6160 04/02/21 ?7371  ?NA 140 143 140 140 137  ?K 4.2 3.6 3.2* 3.4* 4.0  ?CL 108 112* 112* 112* 108  ?CO2 19* '23 25 24  '$ 21*  ?GLUCOSE 144* 180* 104* 140* 181*  ?BUN 49* 41* 35* 32* 25*  ?CREATININE 1.78* 1.69* 1.62* 1.54* 1.44*  ?CALCIUM 8.2* 7.9* 7.7* 7.4* 7.5*  ?MG 2.4 2.1 2.0 2.0 1.9  ?PHOS 2.5 2.6 2.2* 2.2* 3.6  ? ?GFR: ?Estimated Creatinine Clearance: 35.2 mL/min (A) (by C-G formula based on SCr of 1.44 mg/dL (H)). ?Liver Function Tests: ?Recent Labs  ?Lab 03/29/21 ?0333 03/30/21 ?0352 03/31/21 ?0626 04/01/21 ?9485 04/02/21 ?4627  ?AST '21 16 16 16 '$ 13*  ?ALT 8 6 '5 6 8  '$ ?ALKPHOS 73 61 61 67 76  ?BILITOT 0.6 0.4 0.4 0.5 0.6  ?PROT 5.1* 5.0* 4.8* 4.6* 5.0*  ?ALBUMIN 2.5* 2.9* 2.6* 2.4* 2.7*  ? ?No results  for input(s): LIPASE, AMYLASE in the last 168 hours. ?No results for input(s): AMMONIA in the last 168 hours. ?Coagulation Profile: ?No results for input(s): INR, PROTIME in the last 168 hours. ?Cardiac Enzymes: ?No results for input(s): CKTOTAL, CKMB, CKMBINDEX, TROPONINI in the last 168 hours. ?BNP (last 3 results) ?No results for input(s): PROBNP in the last 8760 hours. ?HbA1C: ?No results for input(s): HGBA1C in the last 72 hours. ?CBG: ?Recent Labs  ?Lab 04/01/21 ?1528 04/01/21 ?2143 04/02/21 ?0615 04/02/21 ?0729 04/02/21 ?1226  ?GLUCAP 110* 140* 173* 168* 154*  ? ?Lipid Profile: ?No results for input(s): CHOL, HDL, LDLCALC, TRIG, CHOLHDL, LDLDIRECT in the last 72 hours. ?Thyroid Function Tests: ?No results for input(s): TSH, T4TOTAL, FREET4, T3FREE, THYROIDAB in the last 72 hours. ?Anemia Panel: ?No results for input(s): VITAMINB12, FOLATE, FERRITIN, TIBC, IRON, RETICCTPCT in the last 72 hours. ?Sepsis Labs: ?No results for input(s): PROCALCITON, LATICACIDVEN in the last 168 hours. ? ?Recent Results (from the past 240 hour(s))  ?Resp Panel by RT-PCR (Flu A&B, Covid)     Status: None  ? Collection Time: 03/25/21 10:46 AM  ? Specimen: Nasopharyngeal(NP) swabs in vial transport medium  ?Result Value Ref Range Status  ? SARS Coronavirus 2 by RT PCR NEGATIVE NEGATIVE Final  ?  Comment: (NOTE) ?SARS-CoV-2 target nucleic acids are  NOT DETECTED. ? ?The SARS-CoV-2 RNA is generally detectable in upper respiratory ?specimens during the acute phase of infection. The lowest ?concentration of SARS-CoV-2 viral copies this assay can detect i

## 2021-04-03 ENCOUNTER — Inpatient Hospital Stay (HOSPITAL_COMMUNITY): Payer: Medicare Other

## 2021-04-03 DIAGNOSIS — J9601 Acute respiratory failure with hypoxia: Secondary | ICD-10-CM | POA: Diagnosis not present

## 2021-04-03 DIAGNOSIS — M7989 Other specified soft tissue disorders: Secondary | ICD-10-CM

## 2021-04-03 LAB — TYPE AND SCREEN
ABO/RH(D): O POS
Antibody Screen: NEGATIVE
Unit division: 0
Unit division: 0

## 2021-04-03 LAB — COMPREHENSIVE METABOLIC PANEL
ALT: 6 U/L (ref 0–44)
AST: 12 U/L — ABNORMAL LOW (ref 15–41)
Albumin: 2.4 g/dL — ABNORMAL LOW (ref 3.5–5.0)
Alkaline Phosphatase: 71 U/L (ref 38–126)
Anion gap: 5 (ref 5–15)
BUN: 25 mg/dL — ABNORMAL HIGH (ref 8–23)
CO2: 22 mmol/L (ref 22–32)
Calcium: 7.5 mg/dL — ABNORMAL LOW (ref 8.9–10.3)
Chloride: 110 mmol/L (ref 98–111)
Creatinine, Ser: 1.46 mg/dL — ABNORMAL HIGH (ref 0.61–1.24)
GFR, Estimated: 46 mL/min — ABNORMAL LOW (ref >60)
Glucose, Bld: 119 mg/dL — ABNORMAL HIGH (ref 70–99)
Potassium: 3.6 mmol/L (ref 3.5–5.1)
Sodium: 137 mmol/L (ref 135–145)
Total Bilirubin: 0.7 mg/dL (ref 0.3–1.2)
Total Protein: 4.6 g/dL — ABNORMAL LOW (ref 6.5–8.1)

## 2021-04-03 LAB — BPAM RBC
Blood Product Expiration Date: 202304092359
Blood Product Expiration Date: 202304092359
ISSUE DATE / TIME: 202303190932
Unit Type and Rh: 5100
Unit Type and Rh: 5100

## 2021-04-03 LAB — GLUCOSE, CAPILLARY
Glucose-Capillary: 106 mg/dL — ABNORMAL HIGH (ref 70–99)
Glucose-Capillary: 150 mg/dL — ABNORMAL HIGH (ref 70–99)
Glucose-Capillary: 115 mg/dL — ABNORMAL HIGH (ref 70–99)
Glucose-Capillary: 140 mg/dL — ABNORMAL HIGH (ref 70–99)

## 2021-04-03 LAB — CBC
HCT: 24.8 % — ABNORMAL LOW (ref 39.0–52.0)
Hemoglobin: 7.8 g/dL — ABNORMAL LOW (ref 13.0–17.0)
MCH: 31.2 pg (ref 26.0–34.0)
MCHC: 31.5 g/dL (ref 30.0–36.0)
MCV: 99.2 fL (ref 80.0–100.0)
Platelets: 226 10*3/uL (ref 150–400)
RBC: 2.5 MIL/uL — ABNORMAL LOW (ref 4.22–5.81)
RDW: 14.1 % (ref 11.5–15.5)
WBC: 7.8 10*3/uL (ref 4.0–10.5)
nRBC: 0 % (ref 0.0–0.2)

## 2021-04-03 LAB — MAGNESIUM: Magnesium: 1.8 mg/dL (ref 1.7–2.4)

## 2021-04-03 LAB — PHOSPHORUS: Phosphorus: 3.1 mg/dL (ref 2.5–4.6)

## 2021-04-03 MED ORDER — METHOCARBAMOL 500 MG PO TABS: 500.0000 mg | ORAL_TABLET | Freq: Three times a day (TID) | ORAL | Status: DC

## 2021-04-03 MED ORDER — FENTANYL CITRATE PF 50 MCG/ML IJ SOSY
25.0000 ug | PREFILLED_SYRINGE | Freq: Once | INTRAMUSCULAR | Status: AC
  Administered 2021-04-03: 25 ug via INTRAVENOUS
  Filled 2021-04-03: qty 1

## 2021-04-03 MED ORDER — LACTULOSE 10 GM/15ML PO SOLN
20.0000 g | Freq: Two times a day (BID) | ORAL | Status: DC | PRN
  Administered 2021-04-03: 20 g via ORAL
  Filled 2021-04-03: qty 30

## 2021-04-03 MED ORDER — METOPROLOL SUCCINATE ER 25 MG PO TB24
25.0000 mg | ORAL_TABLET | Freq: Every day | ORAL | Status: DC
  Administered 2021-04-03: 25 mg via ORAL
  Filled 2021-04-03 (×2): qty 1

## 2021-04-03 MED ORDER — METHOCARBAMOL 500 MG PO TABS
500.0000 mg | ORAL_TABLET | Freq: Three times a day (TID) | ORAL | Status: DC
  Administered 2021-04-03 – 2021-04-04 (×3): 500 mg via ORAL
  Filled 2021-04-03 (×3): qty 1

## 2021-04-03 MED ORDER — TRAMADOL HCL 50 MG PO TABS
50.0000 mg | ORAL_TABLET | Freq: Four times a day (QID) | ORAL | Status: DC | PRN
  Administered 2021-04-03: 50 mg via ORAL
  Filled 2021-04-03: qty 1

## 2021-04-03 MED ORDER — FENTANYL CITRATE PF 50 MCG/ML IJ SOSY
12.5000 ug | PREFILLED_SYRINGE | INTRAMUSCULAR | Status: DC | PRN
  Administered 2021-04-04 (×3): 12.5 ug via INTRAVENOUS
  Filled 2021-04-03 (×3): qty 1

## 2021-04-03 MED ORDER — MORPHINE SULFATE (PF) 2 MG/ML IV SOLN
1.0000 mg | Freq: Once | INTRAVENOUS | Status: AC
Start: 2021-04-03 — End: 2021-04-03
  Administered 2021-04-03: 1 mg via INTRAVENOUS
  Filled 2021-04-03: qty 1

## 2021-04-03 MED ORDER — SERTRALINE HCL 25 MG PO TABS
25.0000 mg | ORAL_TABLET | Freq: Every day | ORAL | Status: DC
  Administered 2021-04-03 – 2021-04-04 (×2): 25 mg via ORAL
  Filled 2021-04-03 (×2): qty 1

## 2021-04-03 MED ORDER — TRAMADOL HCL 50 MG PO TABS: 50.0000 mg | ORAL_TABLET | Freq: Four times a day (QID) | ORAL | Status: DC | PRN

## 2021-04-03 MED ORDER — LACTULOSE 10 GM/15ML PO SOLN: 20.0000 g | Freq: Two times a day (BID) | ORAL | Status: DC | PRN

## 2021-04-03 MED ORDER — ADULT MULTIVITAMIN W/MINERALS CH
1.0000 | ORAL_TABLET | Freq: Every day | ORAL | Status: DC
  Administered 2021-04-03 – 2021-04-04 (×2): 1 via ORAL
  Filled 2021-04-03 (×2): qty 1

## 2021-04-03 MED ORDER — CLOPIDOGREL BISULFATE 75 MG PO TABS
75.0000 mg | ORAL_TABLET | Freq: Every day | ORAL | Status: DC
  Administered 2021-04-03 – 2021-04-04 (×2): 75 mg via ORAL
  Filled 2021-04-03 (×2): qty 1

## 2021-04-03 NOTE — Progress Notes (Addendum)
Patient had 2 medium-sized, snake-like BM on the bedside commode between 7a and 7p. ? ?Angie Fava, RN  ?

## 2021-04-03 NOTE — Progress Notes (Signed)
Pt cancel ?PT Cancellation Note ? ?Patient Details ?Name: [name] ?MRN: [id number] ?DOB: 05/22/1931 ? ? ?Cancelled Treatment:     Pt OOB in recliner.  Also on/off BSC several times today to attempt BM.  Will attempt to see tomorrow.  Pt has been evaluated with rec for SNF however per chart review pt prefers home ([facility]) ? ? ?[name] ?04/03/2021, 4:14 PM ? ? ?

## 2021-04-03 NOTE — Progress Notes (Signed)
?PROGRESS NOTE ? ? ? ?[name]  JQZ:[id number] DOB: [birth year] DOA: 03/25/2021 ?PCP: Patient, No Pcp Per (Inactive)  ? ?  ?Brief Narrative:  ?[name] is an 88 y.o. male with PMHx allergy rhinitis, osteoarthritis, kyphosis, asthmatic bronchitis, CAD, type II DM, stage III CKD, history of DVT, hyperlipidemia, GERD, impaired hearing, motor lithiasis, skin cancer, hypertension, hypothyroidism, macular degeneration, paroxysmal atrial fibrillation, Parkinson's disease, history of unspecified stroke, prostate cancer, PAF, urgency frequency syndrome, urticaria who was just discharged 03/24/21 from the hospital due to viral pneumonia. He now returns from his ALF due to dyspnea and decreased oral intake.  EMS stated he was 74% on room air.  He was started on azithromycin and Rocephin and admitted to the hospital. ? ?New events last 24 hours / Subjective: ?Sleeping comfortably when I entered the room.  Remains a very poor historian.  Per nursing report, patient has had intermittent complaints of pain.  Also with right upper extremity swelling and erythema. ? ?Assessment & Plan: ?  ?Principal Problem: ?  Acute respiratory failure with hypoxia ([place]) ?Active Problems: ?  Pneumonia ?  Type II diabetes mellitus with renal manifestations ([place]) ?  Benign essential HTN ?  Paroxysmal atrial fibrillation (HCC) ?  Parkinson's disease ([place]) ?  Coronary artery disease involving native coronary artery of native heart without angina pectoris ?  Hypothyroidism ?  Anemia of chronic disease ?  GERD (gastroesophageal reflux disease) ?  Hyperlipidemia LDL goal <70 ?  Physical deconditioning ?  Protein calorie malnutrition ([place]) ?  Aspiration into lower respiratory tract ?  Aphasia due to disease ?  Protein-calorie malnutrition, severe ? ? ?Acute hypoxemic respiratory failure secondary to pneumonia, HCAP, aspiration ?-Respiratory viral panel negative, COVID and influenza negative ?-Completed 5-day course of antibiotics ?-Remains on  dysphagia diet.  Patient understood risk of liberalizing his diet ?-On room air this morning ? ?Constipation ?-Per report has not had a bowel movement in 10 days.  No result with suppository or enema yesterday.  KUB revealed moderate to large stool burden in ascending and transverse colon without sign of fecal impaction. ?-Continue bowel regimen with Colace, MiraLAX and lactulose ? ?Right upper extremity swelling ?-DVT ultrasound negative ? ?AKI on CKD stage IIIa ?-Baseline creatinine 1.7-2 ?-Resolved ? ?Diabetes mellitus, well controlled ?-A1c 6.4 ?-Sliding scale insulin ? ?Paroxysmal atrial fibrillation ?-Eliquis, metoprolol ? ?Hyperlipidemia ?-Intolerant to statins ? ?Hypertension ?-Resume metoprolol  ? ?History of CAD, CVA ?-Plavix ? ?Parkinson's disease ?-Sinemet ? ?Dementia ?-Seroquel nightly ? ?Hypothyroidism ?-Synthroid ? ?GERD ?-PPI ? ?DVT prophylaxis:  ?apixaban (ELIQUIS) tablet 2.5 mg Start: 03/25/21 1315 ?apixaban (ELIQUIS) tablet 2.5 mg  ?Code Status: DNR ?Family Communication: No family at bedside, updated daughter over the phone  ?Disposition Plan:  ?Status is: Inpatient ?Remains inpatient appropriate because: Needs to have a bowel movement today.  Return back to ALF tomorrow ? ? ?Antimicrobials:  ?Anti-infectives (From admission, onward)  ? ? Start     Dose/Rate Route Frequency Ordered Stop  ? 03/29/21 1245  azithromycin (ZITHROMAX) 500 mg in sodium chloride 0.9 % 250 mL IVPB       ? 500 mg ?250 mL/hr over 60 Minutes Intravenous Every 24 hours 03/29/21 1153 03/30/21 1436  ? 03/29/21 1230  azithromycin (ZITHROMAX) tablet 500 mg  Status:  Discontinued       ? 500 mg Oral Daily 03/29/21 1138 03/29/21 1153  ? 03/26/21 1100  cefTRIAXone (ROCEPHIN) 2 g in sodium chloride 0.9 % 100 mL IVPB       ?  2 g ?200 mL/hr over 30 Minutes Intravenous Every 24 hours 03/25/21 1306 03/30/21 1244  ? 03/26/21 1100  azithromycin (ZITHROMAX) 500 mg in sodium chloride 0.9 % 250 mL IVPB  Status:  Discontinued       ? 500  mg ?250 mL/hr over 60 Minutes Intravenous Every 24 hours 03/25/21 1306 03/29/21 1138  ? 03/25/21 1600  Uro-MP 118 MG CAPS 1 capsule  Status:  Discontinued       ? 1 capsule Oral 3 times daily 03/25/21 1305 03/25/21 1354  ? 03/25/21 1600  Urelle (URELLE/URISED) 81 MG tablet 81 mg       ? 1 tablet Oral 3 times daily 03/25/21 1354    ? 03/25/21 1000  cefTRIAXone (ROCEPHIN) 2 g in sodium chloride 0.9 % 100 mL IVPB       ? 2 g ?200 mL/hr over 30 Minutes Intravenous  Once 03/25/21 0947 03/25/21 1134  ? 03/25/21 1000  azithromycin (ZITHROMAX) 500 mg in sodium chloride 0.9 % 250 mL IVPB       ? 500 mg ?250 mL/hr over 60 Minutes Intravenous  Once 03/25/21 0947 03/25/21 1208  ? ?  ? ? ? ?Objective: ?Vitals:  ? 04/02/21 0838 04/02/21 1230 04/02/21 2009 04/03/21 0700  ?BP:  (!) 146/74  (!) 174/85  ?Pulse:  75  68  ?Resp: (!) 32 20  18  ?Temp:  98.1 ?F (36.7 ?C)  (!) 97.3 ?F (36.3 ?C)  ?TempSrc:  Oral  Oral  ?SpO2: 96% 97% 93% 98%  ?Weight:      ?Height:      ? ? ?Intake/Output Summary (Last 24 hours) at 04/03/2021 1045 ?Last data filed at 04/03/2021 1038 ?Gross per 24 hour  ?Intake 1000 ml  ?Output 1025 ml  ?Net -25 ml  ? ? ?Filed Weights  ? 03/25/21 0951  ?Weight: 68.9 kg  ? ? ?Examination:  ?General exam: Appears calm and comfortable  ?Respiratory system: Clear to auscultation. Respiratory effort normal. No respiratory distress.  On room air ?Cardiovascular system: S1 & S2 heard, RRR. No murmurs. No pedal edema. ?Gastrointestinal system: Abdomen is nondistended, soft and nontender. Normal bowel sounds heard. ?Central nervous system: Alert  ?Skin: +RUE with some erythema and swelling  ? ?Data Reviewed: I have personally reviewed following labs and imaging studies ? ?CBC: ?Recent Labs  ?Lab 03/28/21 ?1751 03/29/21 ?0258 03/30/21 ?0352 03/30/21 ?1448 03/31/21 ?5277 04/01/21 ?8242 04/02/21 ?3536 04/03/21 ?0341  ?WBC 15.1* 13.6* 10.2  --  10.8* 12.1* 11.4* 7.8  ?NEUTROABS 13.1* 11.5* 7.9*  --  8.6*  --   --   --   ?HGB 8.4* 8.3*  6.7* 7.2* 7.6* 7.6* 8.1* 7.8*  ?HCT 26.7* 27.8* 21.6* 23.3* 24.4* 25.4* 26.1* 24.8*  ?MCV 100.4* 106.1* 102.4*  --  100.4* 103.7* 98.9 99.2  ?PLT 282 252 245  --  224 246 260 226  ? ? ?Basic Metabolic Panel: ?Recent Labs  ?Lab 03/30/21 ?0352 03/31/21 ?1443 04/01/21 ?1540 04/02/21 ?0867 04/03/21 ?0341  ?NA 143 140 140 137 137  ?K 3.6 3.2* 3.4* 4.0 3.6  ?CL 112* 112* 112* 108 110  ?CO2 '23 25 24 '$ 21* 22  ?GLUCOSE 180* 104* 140* 181* 119*  ?BUN 41* 35* 32* 25* 25*  ?CREATININE 1.69* 1.62* 1.54* 1.44* 1.46*  ?CALCIUM 7.9* 7.7* 7.4* 7.5* 7.5*  ?MG 2.1 2.0 2.0 1.9 1.8  ?PHOS 2.6 2.2* 2.2* 3.6 3.1  ? ? ?GFR: ?Estimated Creatinine Clearance: 34.7 mL/min (A) (by C-G formula based on SCr of 1.46  mg/dL (H)). ?Liver Function Tests: ?Recent Labs  ?Lab 03/30/21 ?0352 03/31/21 ?9741 04/01/21 ?6384 04/02/21 ?5364 04/03/21 ?0341  ?AST '16 16 16 '$ 13* 12*  ?ALT 6 '5 6 8 6  '$ ?ALKPHOS 61 61 67 76 71  ?BILITOT 0.4 0.4 0.5 0.6 0.7  ?PROT 5.0* 4.8* 4.6* 5.0* 4.6*  ?ALBUMIN 2.9* 2.6* 2.4* 2.7* 2.4*  ? ? ?No results for input(s): LIPASE, AMYLASE in the last 168 hours. ?No results for input(s): AMMONIA in the last 168 hours. ?Coagulation Profile: ?No results for input(s): INR, PROTIME in the last 168 hours. ?Cardiac Enzymes: ?No results for input(s): CKTOTAL, CKMB, CKMBINDEX, TROPONINI in the last 168 hours. ?BNP (last 3 results) ?No results for input(s): PROBNP in the last 8760 hours. ?HbA1C: ?No results for input(s): HGBA1C in the last 72 hours. ?CBG: ?Recent Labs  ?Lab 04/02/21 ?0729 04/02/21 ?1226 04/02/21 ?1633 04/02/21 ?2209 04/03/21 ?6803  ?GLUCAP 168* 154* 122* 75 106*  ? ? ?Lipid Profile: ?No results for input(s): CHOL, HDL, LDLCALC, TRIG, CHOLHDL, LDLDIRECT in the last 72 hours. ?Thyroid Function Tests: ?No results for input(s): TSH, T4TOTAL, FREET4, T3FREE, THYROIDAB in the last 72 hours. ?Anemia Panel: ?No results for input(s): VITAMINB12, FOLATE, FERRITIN, TIBC, IRON, RETICCTPCT in the last 72 hours. ?Sepsis Labs: ?No results for  input(s): PROCALCITON, LATICACIDVEN in the last 168 hours. ? ?Recent Results (from the past 240 hour(s))  ?Resp Panel by RT-PCR (Flu A&B, Covid)     Status: None  ? Collection Time: 03/25/21 10:46 AM  ? Specimen: Nas

## 2021-04-03 NOTE — Progress Notes (Signed)
Patient takes PO fluids only when offered, and only 60-120 at a time.  Patient ate <25% of breakfast and about 10% of lunch. ? ?Given scheduled Miralax, colace, and PRN lactulose.  Patient is passing gas, but no BM even with multiple attempts on bedside commode.   ? ?Will continue to monitor and encourage fluids. ? ?Angie Fava, RN  ?

## 2021-04-03 NOTE — Progress Notes (Signed)
Right upper extremity venous duplex has been completed. ?Preliminary results can be found in CV Proc through chart review.  ? ?04/03/21 10:00 AM ?Carlos Levering RVT   ?

## 2021-04-03 NOTE — Care Management Important Message (Signed)
Important Message ? ?Patient Details IM Letter placed in Patients room. ?Name: [name] ?MRN: [id number] ?Date of Birth: 01/16/1931 ? ? ?Medicare Important Message Given:  Yes ? ? ? ? ?Kerin Salen ?04/03/2021, 1:02 PM ?

## 2021-04-04 DIAGNOSIS — J9601 Acute respiratory failure with hypoxia: Secondary | ICD-10-CM | POA: Diagnosis not present

## 2021-04-04 LAB — GLUCOSE, CAPILLARY
Glucose-Capillary: 126 mg/dL — ABNORMAL HIGH (ref 70–99)
Glucose-Capillary: 126 mg/dL — ABNORMAL HIGH (ref 70–99)

## 2021-04-04 LAB — BASIC METABOLIC PANEL
Anion gap: 5 (ref 5–15)
BUN: 28 mg/dL — ABNORMAL HIGH (ref 8–23)
CO2: 23 mmol/L (ref 22–32)
Calcium: 7.7 mg/dL — ABNORMAL LOW (ref 8.9–10.3)
Chloride: 105 mmol/L (ref 98–111)
Creatinine, Ser: 1.59 mg/dL — ABNORMAL HIGH (ref 0.61–1.24)
GFR, Estimated: 42 mL/min — ABNORMAL LOW (ref >60)
Glucose, Bld: 195 mg/dL — ABNORMAL HIGH (ref 70–99)
Potassium: 4.3 mmol/L (ref 3.5–5.1)
Sodium: 133 mmol/L — ABNORMAL LOW (ref 135–145)

## 2021-04-04 MED ORDER — DILTIAZEM HCL 30 MG PO TABS
30.0000 mg | ORAL_TABLET | Freq: Four times a day (QID) | ORAL | Status: DC
  Administered 2021-04-04: 30 mg via ORAL
  Filled 2021-04-04: qty 1

## 2021-04-04 MED ORDER — QUETIAPINE FUMARATE 25 MG PO TABS: 25.0000 mg | ORAL_TABLET | Freq: Every day | ORAL | 0 refills | Status: AC

## 2021-04-04 MED ORDER — METOPROLOL TARTRATE 25 MG PO TABS
12.5000 mg | ORAL_TABLET | Freq: Two times a day (BID) | ORAL | Status: DC
  Administered 2021-04-04: 12.5 mg via ORAL
  Filled 2021-04-04: qty 1

## 2021-04-04 MED ORDER — TRAMADOL HCL 50 MG PO TABS
50.0000 mg | ORAL_TABLET | Freq: Four times a day (QID) | ORAL | 0 refills | Status: AC | PRN
Start: 2021-04-04 — End: ?

## 2021-04-04 MED ORDER — DILTIAZEM HCL ER COATED BEADS 120 MG PO CP24
120.0000 mg | ORAL_CAPSULE | Freq: Every day | ORAL | Status: DC
Start: 2021-04-04 — End: 2021-04-04
  Filled 2021-04-04: qty 1

## 2021-04-04 NOTE — Plan of Care (Signed)
?  Problem: Clinical Measurements: ?Goal: Ability to maintain a body temperature in the normal range will improve ?Outcome: Progressing ?  ?Problem: Respiratory: ?Goal: Ability to maintain adequate ventilation will improve ?Outcome: Progressing ?Goal: Ability to maintain a clear airway will improve ?Outcome: Progressing ?  ?Problem: Safety: ?Goal: Ability to remain free from injury will improve ?Outcome: Progressing ?  ?Problem: Clinical Measurements: ?Goal: Diagnostic test results will improve ?Outcome: Adequate for Discharge ?  ?

## 2021-04-04 NOTE — Plan of Care (Signed)
?  Problem: Activity: ?Goal: Ability to tolerate increased activity will improve ?Outcome: Adequate for Discharge ?  ?Problem: Clinical Measurements: ?Goal: Ability to maintain a body temperature in the normal range will improve ?04/04/2021 1242 by Lennie Hummer, RN ?Outcome: Adequate for Discharge ?04/04/2021 1213 by Lennie Hummer, RN ?Outcome: Progressing ?  ?Problem: Respiratory: ?Goal: Ability to maintain adequate ventilation will improve ?04/04/2021 1242 by Lennie Hummer, RN ?Outcome: Adequate for Discharge ?04/04/2021 1213 by Lennie Hummer, RN ?Outcome: Progressing ?Goal: Ability to maintain a clear airway will improve ?04/04/2021 1242 by Lennie Hummer, RN ?Outcome: Adequate for Discharge ?04/04/2021 1213 by Lennie Hummer, RN ?Outcome: Progressing ?  ?Problem: Education: ?Goal: Knowledge of General Education information will improve ?Description: Including pain rating scale, medication(s)/side effects and non-pharmacologic comfort measures ?Outcome: Adequate for Discharge ?  ?Problem: Clinical Measurements: ?Goal: Ability to maintain clinical measurements within normal limits will improve ?Outcome: Adequate for Discharge ?Goal: Will remain free from infection ?Outcome: Adequate for Discharge ?Goal: Diagnostic test results will improve ?04/04/2021 1242 by Lennie Hummer, RN ?Outcome: Adequate for Discharge ?04/04/2021 1213 by Lennie Hummer, RN ?Outcome: Adequate for Discharge ?Goal: Respiratory complications will improve ?Outcome: Adequate for Discharge ?Goal: Cardiovascular complication will be avoided ?Outcome: Adequate for Discharge ?  ?Problem: Activity: ?Goal: Risk for activity intolerance will decrease ?Outcome: Adequate for Discharge ?  ?Problem: Nutrition: ?Goal: Adequate nutrition will be maintained ?Outcome: Adequate for Discharge ?  ?Problem: Coping: ?Goal: Level of anxiety will decrease ?Outcome: Adequate for Discharge ?  ?Problem: Elimination: ?Goal: Will not experience complications related  to bowel motility ?Outcome: Adequate for Discharge ?Goal: Will not experience complications related to urinary retention ?Outcome: Adequate for Discharge ?  ?Problem: Pain Managment: ?Goal: General experience of comfort will improve ?Outcome: Adequate for Discharge ?  ?Problem: Safety: ?Goal: Ability to remain free from injury will improve ?04/04/2021 1242 by Lennie Hummer, RN ?Outcome: Adequate for Discharge ?04/04/2021 1213 by Lennie Hummer, RN ?Outcome: Progressing ?  ?Problem: Skin Integrity: ?Goal: Risk for impaired skin integrity will decrease ?Outcome: Adequate for Discharge ?  ?Problem: Acute Rehab PT Goals(only PT should resolve) ?Goal: Pt Will Go Supine/Side To Sit ?Outcome: Adequate for Discharge ?Goal: Pt Will Go Sit To Supine/Side ?Outcome: Adequate for Discharge ?Goal: Pt Will Transfer Bed To Chair/Chair To Bed ?Outcome: Adequate for Discharge ?Goal: Pt Will Perform Standing Balance Or Pre-Gait ?Outcome: Adequate for Discharge ?  ?Problem: Acute Rehab OT Goals (only OT should resolve) ?Goal: Pt. Will Perform Upper Body Bathing ?Outcome: Adequate for Discharge ?Goal: Pt. Will Transfer To Toilet ?Outcome: Adequate for Discharge ?Goal: Pt. Will Perform Toileting-Clothing Manipulation ?Outcome: Adequate for Discharge ?Goal: OT Additional ADL Goal #1 ?Outcome: Adequate for Discharge ?  ?Problem: SLP Dysphagia Goals ?Goal: Patient will utilize recommended strategies ?Description: Patient will utilize recommended strategies during swallow to increase swallowing safety with ?Outcome: Adequate for Discharge ?  ?Problem: Malnutrition  (NI-5.2) ?Goal: Food and/or nutrient delivery ?Description: Individualized approach for food/nutrient provision. ?Outcome: Adequate for Discharge ?  ?

## 2021-04-04 NOTE — TOC Transition Note (Addendum)
Transition of Care (TOC) - CM/SW Discharge Note ? ? ?Patient Details  ?Name: [name] ?MRN: [id number] ?Date of Birth: 03-20-1931 ? ?Transition of Care (TOC) CM/SW Contact:  ?[place], LCSW ?Phone Number: ?04/04/2021, 11:24 AM ? ? ?Clinical Narrative:    ? ?Patient will DC to: [facility] ?Anticipated DC date: 04/04/21 ?Family notified: Daughter Amy ?Transport by: Corey Harold ? ?CSW notified [facility] who follows pt for [employer] PT/OT ? ?Per MD patient ready for DC to [facility]. RN, patient, patient's family, and facility notified of DC. Discharge Summary and FL2 sent to facility. RN to call report prior to discharge [phone] and ask for Vicente Males). DC packet on chart. Ambulance transport requested for patient.  ? ?CSW will sign off for now as social work intervention is no longer needed. Please consult [place] again if new needs arise.  ? ?  ?Barriers to Discharge: No Barriers Identified ? ? ?Patient Goals and CMS Choice ?Patient states their goals for this hospitalization and ongoing recovery are:: [facility] ?  ?  ? ?Discharge Placement ?  ?           ?Patient chooses bed at: [facility] [place] ?Patient to be transferred to facility by: [facility] ?Name of family member notified: Daughter Amy ?Patient and family notified of of transfer: 04/04/21 ? ?Discharge Plan and Services ?  ?  ?           ?  ?  ?  ?  ?  ?  ?  ?  ?  ?  ? ?Social Determinants of Health (SDOH) Interventions ?  ? ? ?Readmission Risk Interventions ? ?  03/26/2021  ? 10:18 AM  ?Readmission Risk Prevention Plan  ?Transportation Screening Complete  ?PCP or Specialist Appt within 3-5 Days Complete  ?[place] or Home Care Consult Complete  ?Social Work Consult for [place] Planning/Counseling Complete  ?Palliative Care Screening Complete  ?Medication Review Press photographer) Complete  ? ? ? ? ? ?

## 2021-04-04 NOTE — Progress Notes (Addendum)
Called [facility] [phone] to give report to receiving Nurse Vicente Males, was told by the secretary that Vicente Males went to lunch. I communicated to the secretary the the pt has been discharge and is on en route via [facility]. I left my contact information for the nurse to me call for report.  ?

## 2021-04-04 NOTE — Discharge Summary (Signed)
Physician Discharge Summary  ?[name] TML:[id number] DOB: Sep 27, 1931 DOA: 03/25/2021 ? ?PCP: Patient, No Pcp Per (Inactive) ? ?Admit date: 03/25/2021 ?Discharge date: 04/04/2021 ? ?Admitted From: ALF ?Disposition:  ALF with home health  ? ?Recommendations for Outpatient Follow-up:  ?Follow up with PCP in 1 week ?Recommend outpatient palliative care to discuss goals of care  ? ?Discharge Condition: Stable ?CODE STATUS: DNR  ?Diet recommendation:  ?Diet Orders (From admission, onward)  ? ?  Start     Ordered  ? 04/02/21 1045  DIET DYS 3 Room service appropriate? Yes; Fluid consistency: Nectar Thick  Diet effective now       ?Question Answer Comment  ?Room service appropriate? Yes   ?Fluid consistency: Nectar Thick   ?  ? 04/02/21 1045  ? ?  ?  ? ?  ? ?Brief/Interim Summary: ?[name] is an 88 y.o. male with PMHx allergy rhinitis, osteoarthritis, kyphosis, asthmatic bronchitis, CAD, type II DM, stage III CKD, history of DVT, hyperlipidemia, GERD, impaired hearing, motor lithiasis, skin cancer, hypertension, hypothyroidism, macular degeneration, paroxysmal atrial fibrillation, Parkinson's disease, history of unspecified stroke, prostate cancer, PAF, urgency frequency syndrome, urticaria who was just discharged 03/24/21 from the hospital due to viral pneumonia. He now returns from his ALF due to dyspnea and decreased oral intake.  EMS stated he was 74% on room air.  He was started on azithromycin and Rocephin and admitted to the hospital. Patient's respiratory failure and pneumonia were treated and he remained stable on room air. He was recommended for dysphagia diet due to aspiration.  ? ?Discharge Diagnoses:  ?Principal Problem: ?  Acute respiratory failure with hypoxia ([place]) ?Active Problems: ?  Pneumonia ?  Type II diabetes mellitus with renal manifestations ([place]) ?  Benign essential HTN ?  Paroxysmal atrial fibrillation (HCC) ?  Parkinson's disease ([place]) ?  Coronary artery disease involving native coronary  artery of native heart without angina pectoris ?  Hypothyroidism ?  Anemia of chronic disease ?  GERD (gastroesophageal reflux disease) ?  Hyperlipidemia LDL goal <70 ?  Physical deconditioning ?  Protein calorie malnutrition ([place]) ?  Aspiration into lower respiratory tract ?  Aphasia due to disease ?  Protein-calorie malnutrition, severe ? ?Acute hypoxemic respiratory failure secondary to pneumonia, HCAP, aspiration ?-Respiratory viral panel negative, COVID and influenza negative ?-Completed 5-day course of antibiotics ?-Remains on dysphagia diet.  Patient understood risk of liberalizing his diet ?-On room air this morning ? ?Constipation ?-Per report has not had a bowel movement in 10 days.  No result with suppository or enema yesterday.  KUB revealed moderate to large stool burden in ascending and transverse colon without sign of fecal impaction. ?-Continue bowel regimen with Colace, MiraLAX and lactulose prn ?-Had 2 bowel movements yesterday  ?  ?Right upper extremity swelling ?-DVT ultrasound negative ?  ?AKI on CKD stage IIIa ?-Baseline creatinine 1.7-2 ?-Resolved ?  ?Diabetes mellitus, well controlled ?-A1c 6.4 ? ?Paroxysmal atrial fibrillation ?-Eliquis, metoprolol, cardizem, amiodarone  ? ?Hyperlipidemia ?-Intolerant to statins ? ?Hypertension ?-Resume metoprolol, cardizem  ?  ?History of CAD, CVA ?-Plavix ?  ?Parkinson's disease ?-Sinemet ? ?Dementia ?-Seroquel nightly ? ?Hypothyroidism ?-Synthroid ?  ?GERD ?-PPI ?  ? ?Discharge Instructions ? ?Discharge Instructions   ? ? Increase activity slowly   Complete by: As directed ?  ? ?  ? ?Allergies as of 04/04/2021   ? ?   Reactions  ? Colchicine Anaphylaxis  ? Hibiclens [chlorhexidine] Hives  ? Atorvastatin Other (See Comments)  ?  Joint pain   ? Celebrex [celecoxib] Swelling  ? Codeine Hives  ? Erythromycin Diarrhea  ? Ezetimibe Other (See Comments)  ? Joint pain   ? Iodinated Contrast Media Nausea And Vomiting, Rash  ? Oxycodone-acetaminophen Hives  ?  Simvastatin Other (See Comments)  ? Joint pain   ? Crestor [rosuvastatin] Other (See Comments)  ? Joint pain  ? Erythromycin Other (See Comments)  ? Listed on MAR  ? Hydroxychloroquine Other (See Comments)  ? Listed on MAR  ? Nitroglycerin Other (See Comments)  ? Listed on MAR  ? Protonix [pantoprazole] Other (See Comments)  ? unknown  ? Tetanus Toxoids Other (See Comments)  ? Listed on MAR  ? Cefpodoxime Rash  ? Cefuroxime Axetil Diarrhea, Rash  ? Nitroglycerin Other (See Comments)  ? Lowers patient's B/P  ? Vantin Other (See Comments)  ? Unknown  ? ?  ? ?  ?Medication List  ?  ? ?STOP taking these medications   ? ?Dextromethorphan-guaiFENesin 10-100 MG/5ML liquid ?  ?furosemide 20 MG tablet ?Commonly known as: LASIX ?  ?Potassium Chloride ER 20 MEQ Tbcr ?  ?predniSONE 20 MG tablet ?Commonly known as: DELTASONE ?  ? ?  ? ?TAKE these medications   ? ?alfuzosin 10 MG 24 hr tablet ?Commonly known as: UROXATRAL ?Take 1 tablet (10 mg total) by mouth at bedtime. ?What changed: when to take this ?  ?amiodarone 200 MG tablet ?Commonly known as: Pacerone ?Take 1 tablet (200 mg total) by mouth daily. ?  ?apixaban 2.5 MG Tabs tablet ?Commonly known as: ELIQUIS ?Take 2.5 mg by mouth 2 (two) times daily. ?  ?ARTIFICIAL TEARS OP ?Place 1 drop into both eyes 4 (four) times daily. ?  ?brimonidine-timolol 0.2-0.5 % ophthalmic solution ?Commonly known as: COMBIGAN ?Place 1 drop into both eyes 2 (two) times daily. ?  ?brinzolamide 1 % ophthalmic suspension ?Commonly known as: AZOPT ?Place 1 drop into the right eye 3 (three) times daily. ?  ?carbidopa-levodopa 25-100 MG tablet ?Commonly known as: SINEMET IR ?Take 1 tablet by mouth 3 (three) times daily. 0800, 1400, and 2000 ?  ?clopidogrel 75 MG tablet ?Commonly known as: PLAVIX ?Take 75 mg by mouth daily. ?  ?diltiazem 120 MG 24 hr capsule ?Commonly known as: CARDIZEM CD ?Take 1 capsule (120 mg total) by mouth daily for 30 days. ?  ?docusate sodium 100 MG capsule ?Commonly known as:  COLACE ?Take 100 mg by mouth 2 (two) times daily. ?  ?ferrous sulfate 325 (65 FE) MG EC tablet ?Take 1 tablet (325 mg total) by mouth 2 (two) times daily with a meal. ?  ?fluticasone 50 MCG/ACT nasal spray ?Commonly known as: FLONASE ?Place 2 sprays into both nostrils daily. ?  ?INSTA-GLUCOSE PO ?Take 1 Tube by mouth as needed (hypoglycemia or BS <60). ?  ?latanoprost 0.005 % ophthalmic solution ?Commonly known as: XALATAN ?Place 1 drop into the right eye at bedtime. ?  ?levothyroxine 150 MCG tablet ?Commonly known as: SYNTHROID ?Take 150 mcg by mouth daily. ?  ?loratadine 10 MG tablet ?Commonly known as: CLARITIN ?Take 10 mg by mouth at bedtime. ?  ?Melatonin 3 MG Subl ?Place 3 mg under the tongue at bedtime. ?  ?metoprolol succinate 25 MG 24 hr tablet ?Commonly known as: TOPROL-XL ?Take 1 tablet (25 mg total) by mouth daily. ?  ?mirabegron ER 50 MG Tb24 tablet ?Commonly known as: MYRBETRIQ ?Take 1 tablet (50 mg total) by mouth daily. ?  ?pantoprazole 40 MG tablet ?Commonly known as: PROTONIX ?Take  40 mg by mouth 2 (two) times daily. ?  ?polyethylene glycol 17 g packet ?Commonly known as: MIRALAX / GLYCOLAX ?Take 17 g by mouth daily. ?  ?PreserVision/Lutein Caps ?Take 1 capsule by mouth 2 (two) times daily. ?  ?QUEtiapine 25 MG tablet ?Commonly known as: SEROQUEL ?Take 1 tablet (25 mg total) by mouth at bedtime. ?  ?sertraline 25 MG tablet ?Commonly known as: ZOLOFT ?Take 25 mg by mouth daily. ?  ?traMADol 50 MG tablet ?Commonly known as: ULTRAM ?Take 1 tablet (50 mg total) by mouth every 6 (six) hours as needed for moderate pain. ?  ?Uro-MP 118 MG Caps ?Take 1 capsule by mouth 3 (three) times daily. ?  ?Vitamin D-3 125 MCG (5000 UT) Tabs ?Take 5,000 Units by mouth daily. ?  ? ?  ? ?  ?  ? ? ?  ?Durable Medical Equipment  ?(From admission, onward)  ?  ? ? ?  ? ?  Start     Ordered  ? 03/27/21 1414  For home use only DME Hospital bed  Once       ?Question Answer Comment  ?Length of Need Lifetime   ?The above medical  condition requires: Patient requires the ability to reposition frequently   ?Head must be elevated greater than: 30 degrees   ?Bed type Semi-electric   ?Support Surface: Gel Overlay   ?  ? 03/27/21 1414  ? ?

## 2021-04-04 NOTE — [place] FL2 (Signed)
?[place] MEDICAID FL2 LEVEL OF CARE SCREENING TOOL  ?  ? ?IDENTIFICATION  ?Patient Name: ?[name] Birthdate: 31-Dec-1931 Sex: male Admission Date (Current Location): ?03/25/2021  ?[place] and [place] Number: ? [place] ?  Facility and Address:  ?[facility],  [place], [place] ?     Provider Number: ?[id number]  ?Attending Physician Name and Address:  ?Dessa Phi, DO ? Relative Name and Phone Number:  ?Lucita Ferrara [phone] ?   ?Current Level of Care: ?Hospital Recommended Level of Care: ?Assisted Living Facility Prior Approval Number: ?  ? ?Date Approved/Denied: ?  PASRR Number: ?  ? ?Discharge Plan: ?Other (Comment) (ALF) ?  ? ?Current Diagnoses: ?Patient Active Problem List  ? Diagnosis Date Noted  ? Protein-calorie malnutrition, severe 04/02/2021  ? Aspiration into lower respiratory tract 03/28/2021  ? Aphasia due to disease 03/28/2021  ? Protein calorie malnutrition ([place]) 03/27/2021  ? Acute respiratory failure with hypoxia ([place]) 03/25/2021  ? Physical deconditioning 03/25/2021  ? Delirium 03/23/2021  ? Dementia ([place]) 03/23/2021  ? Hypothyroidism 03/21/2021  ? Pneumonia 03/20/2021  ? Secondary hypercoagulable state ([place]) 08/10/2019  ? Anemia of chronic disease 11/13/2018  ? Parkinson's disease ([place]) 11/13/2018  ? Paroxysmal atrial fibrillation (HCC)   ? Coronary artery disease involving native coronary artery of native heart without angina pectoris   ? Hyperlipidemia LDL goal <70   ? Urinary tract infection with hematuria   ? Non-ST elevation (NSTEMI) myocardial infarction [facility])   ? GERD (gastroesophageal reflux disease) 08/04/2017  ? Elevated troponin 08/03/2017  ? Chest pain 08/03/2017  ? CKD (chronic kidney disease), stage III ([place]) 08/03/2017  ? Type II diabetes mellitus with renal manifestations ([place]) 08/03/2017  ? Pseudophakia of both eyes 05/31/2017  ? Retained ureteral stent 04/22/2016  ? Ureteral calculus 01/25/2016  ? Benign essential HTN 10/24/2015  ?  History of CVA (cerebrovascular accident) 10/14/2014  ? TIA (transient ischemic attack)   ? CME (cystoid macular edema), left 08/10/2013  ? Squamous cell carcinoma of eyelid 05/15/2013  ? Blepharitis 03/16/2013  ? Posterior vitreous detachment of right eye 10/23/2011  ? Exudative age-related macular degeneration ([place]) 07/28/2011  ? Nonproliferative diabetic retinopathy ([place]) 04/07/2011  ? Insomnia, unspecified 05/19/2010  ? ATAXIA 03/17/2010  ? DIZZINESS 03/06/2010  ? Chronic constipation 02/21/2008  ? OSTEOARTHRITIS, KNEE, SEVERE 01/12/2008  ? URTICARIA 04/22/2006  ? POPLITEAL CYST 04/22/2006  ? THYROIDECTOMY, SUBTOTAL, HX OF 04/22/2006  ? TOTAL KNEE REPLACEMENT, HX OF 04/22/2006  ? ? ?Orientation RESPIRATION BLADDER Height & Weight   ?  ?Self ? Normal Incontinent Weight: 152 lb (68.9 kg) ?Height:  '5\' 11"'$  (180.3 cm)  ?BEHAVIORAL SYMPTOMS/MOOD NEUROLOGICAL BOWEL NUTRITION STATUS  ?Other (Comment) (has dementia, sometimes confused)   Continent Diet (nectar thick (chopped meat))  ?AMBULATORY STATUS COMMUNICATION OF NEEDS Skin   ?Extensive Assist Verbally Normal ?  ?  ?  ?    ?     ?     ? ? ?Personal Care Assistance Level of Assistance  ?Bathing, Feeding, Dressing Bathing Assistance: Maximum assistance ?Feeding assistance: Limited assistance ?Dressing Assistance: Maximum assistance ?   ? ?Functional Limitations Info  ?Sight, Hearing, Speech Sight Info: Impaired ?Hearing Info: Impaired ?Speech Info: Adequate  ? ? ?SPECIAL CARE FACTORS FREQUENCY  ?PT (By licensed PT), OT (By licensed OT)   ?  ?PT Frequency: 2-3x/week ?OT Frequency: 2-3x/week ?  ?  ?  ?   ? ? ?Contractures Contractures Info: Not present  ? ? ?Additional Factors Info  ?  Code Status, Allergies Code Status Info: DNR ?Allergies Info: Colchicine, Hibiclens (Chlorhexidine), Atorvastatin, Celebrex (Celecoxib), Codeine, Erythromycin, Ezetimibe, Iodinated Contrast Media, Oxycodone-acetaminophen, Simvastatin, Crestor (Rosuvastatin), Erythromycin,  Hydroxychloroquine, Nitroglycerin, Protonix (Pantoprazole), Tetanus Toxoids, Cefpodoxime, Cefuroxime Axetil, Nitroglycerin, Vantin ?  ?  ?  ?   ? ? ? ?Discharge Medications: ? ?STOP taking these medications   ?  ?Dextromethorphan-guaiFENesin 10-100 MG/5ML liquid ?   ?furosemide 20 MG tablet ?Commonly known as: LASIX ?   ?Potassium Chloride ER 20 MEQ Tbcr ?   ?predniSONE 20 MG tablet ?Commonly known as: DELTASONE ?   ?  ?   ?  ?TAKE these medications   ?  ?alfuzosin 10 MG 24 hr tablet ?Commonly known as: UROXATRAL ?Take 1 tablet (10 mg total) by mouth at bedtime. ?What changed: when to take this ?   ?amiodarone 200 MG tablet ?Commonly known as: Pacerone ?Take 1 tablet (200 mg total) by mouth daily. ?   ?apixaban 2.5 MG Tabs tablet ?Commonly known as: ELIQUIS ?Take 2.5 mg by mouth 2 (two) times daily. ?   ?ARTIFICIAL TEARS OP ?Place 1 drop into both eyes 4 (four) times daily. ?   ?brimonidine-timolol 0.2-0.5 % ophthalmic solution ?Commonly known as: COMBIGAN ?Place 1 drop into both eyes 2 (two) times daily. ?   ?brinzolamide 1 % ophthalmic suspension ?Commonly known as: AZOPT ?Place 1 drop into the right eye 3 (three) times daily. ?   ?carbidopa-levodopa 25-100 MG tablet ?Commonly known as: SINEMET IR ?Take 1 tablet by mouth 3 (three) times daily. 0800, 1400, and 2000 ?   ?clopidogrel 75 MG tablet ?Commonly known as: PLAVIX ?Take 75 mg by mouth daily. ?   ?diltiazem 120 MG 24 hr capsule ?Commonly known as: CARDIZEM CD ?Take 1 capsule (120 mg total) by mouth daily for 30 days. ?   ?docusate sodium 100 MG capsule ?Commonly known as: COLACE ?Take 100 mg by mouth 2 (two) times daily. ?   ?ferrous sulfate 325 (65 FE) MG EC tablet ?Take 1 tablet (325 mg total) by mouth 2 (two) times daily with a meal. ?   ?fluticasone 50 MCG/ACT nasal spray ?Commonly known as: FLONASE ?Place 2 sprays into both nostrils daily. ?   ?INSTA-GLUCOSE PO ?Take 1 Tube by mouth as needed (hypoglycemia or BS <60). ?   ?latanoprost 0.005 % ophthalmic  solution ?Commonly known as: XALATAN ?Place 1 drop into the right eye at bedtime. ?   ?levothyroxine 150 MCG tablet ?Commonly known as: SYNTHROID ?Take 150 mcg by mouth daily. ?   ?loratadine 10 MG tablet ?Commonly known as: CLARITIN ?Take 10 mg by mouth at bedtime. ?   ?Melatonin 3 MG Subl ?Place 3 mg under the tongue at bedtime. ?   ?metoprolol succinate 25 MG 24 hr tablet ?Commonly known as: TOPROL-XL ?Take 1 tablet (25 mg total) by mouth daily. ?   ?mirabegron ER 50 MG Tb24 tablet ?Commonly known as: MYRBETRIQ ?Take 1 tablet (50 mg total) by mouth daily. ?   ?pantoprazole 40 MG tablet ?Commonly known as: PROTONIX ?Take 40 mg by mouth 2 (two) times daily. ?   ?polyethylene glycol 17 g packet ?Commonly known as: MIRALAX / GLYCOLAX ?Take 17 g by mouth daily. ?   ?PreserVision/Lutein Caps ?Take 1 capsule by mouth 2 (two) times daily. ?   ?QUEtiapine 25 MG tablet ?Commonly known as: SEROQUEL ?Take 1 tablet (25 mg total) by mouth at bedtime. ?   ?sertraline 25 MG tablet ?Commonly known as: ZOLOFT ?Take 25 mg by mouth daily. ?   ?  traMADol 50 MG tablet ?Commonly known as: ULTRAM ?Take 1 tablet (50 mg total) by mouth every 6 (six) hours as needed for moderate pain. ?   ?Uro-MP 118 MG Caps ?Take 1 capsule by mouth 3 (three) times daily. ?   ?Vitamin D-3 125 MCG (5000 UT) Tabs ?Take 5,000 Units by mouth daily. ?   ?  ? ?Relevant Imaging Results: ? ?Relevant Lab Results: ? ? ?Additional Information ?Covid-19 vaccine - Moderna x3; [id number] ? ?Emmons, LCSW ? ? ? ? ?

## 2021-04-05 ENCOUNTER — Emergency Department (HOSPITAL_COMMUNITY): Payer: Medicare Other

## 2021-04-05 ENCOUNTER — Inpatient Hospital Stay (HOSPITAL_COMMUNITY)
Admission: EM | Admit: 2021-04-05 | Discharge: 2021-04-12 | DRG: 951 | Disposition: E | Payer: Medicare Other | Source: Skilled Nursing Facility | Attending: Family Medicine | Admitting: Family Medicine

## 2021-04-05 ENCOUNTER — Other Ambulatory Visit: Payer: Self-pay

## 2021-04-05 ENCOUNTER — Encounter (HOSPITAL_COMMUNITY): Payer: Self-pay | Admitting: Emergency Medicine

## 2021-04-05 DIAGNOSIS — Z823 Family history of stroke: Secondary | ICD-10-CM

## 2021-04-05 DIAGNOSIS — E785 Hyperlipidemia, unspecified: Secondary | ICD-10-CM | POA: Diagnosis present

## 2021-04-05 DIAGNOSIS — R29715 NIHSS score 15: Secondary | ICD-10-CM | POA: Diagnosis present

## 2021-04-05 DIAGNOSIS — I6389 Other cerebral infarction: Secondary | ICD-10-CM | POA: Diagnosis present

## 2021-04-05 DIAGNOSIS — Z515 Encounter for palliative care: Principal | ICD-10-CM

## 2021-04-05 DIAGNOSIS — I480 Paroxysmal atrial fibrillation: Secondary | ICD-10-CM | POA: Diagnosis present

## 2021-04-05 DIAGNOSIS — G936 Cerebral edema: Secondary | ICD-10-CM | POA: Diagnosis present

## 2021-04-05 DIAGNOSIS — I252 Old myocardial infarction: Secondary | ICD-10-CM

## 2021-04-05 DIAGNOSIS — I2510 Atherosclerotic heart disease of native coronary artery without angina pectoris: Secondary | ICD-10-CM | POA: Diagnosis present

## 2021-04-05 DIAGNOSIS — E039 Hypothyroidism, unspecified: Secondary | ICD-10-CM | POA: Diagnosis present

## 2021-04-05 DIAGNOSIS — F0280 Dementia in other diseases classified elsewhere without behavioral disturbance: Secondary | ICD-10-CM | POA: Diagnosis present

## 2021-04-05 DIAGNOSIS — I639 Cerebral infarction, unspecified: Secondary | ICD-10-CM | POA: Diagnosis present

## 2021-04-05 DIAGNOSIS — Y92122 Bedroom in nursing home as the place of occurrence of the external cause: Secondary | ICD-10-CM

## 2021-04-05 DIAGNOSIS — E1122 Type 2 diabetes mellitus with diabetic chronic kidney disease: Secondary | ICD-10-CM | POA: Diagnosis present

## 2021-04-05 DIAGNOSIS — K219 Gastro-esophageal reflux disease without esophagitis: Secondary | ICD-10-CM | POA: Diagnosis present

## 2021-04-05 DIAGNOSIS — W19XXXA Unspecified fall, initial encounter: Secondary | ICD-10-CM | POA: Diagnosis present

## 2021-04-05 DIAGNOSIS — Z79899 Other long term (current) drug therapy: Secondary | ICD-10-CM

## 2021-04-05 DIAGNOSIS — R29810 Facial weakness: Secondary | ICD-10-CM | POA: Diagnosis present

## 2021-04-05 DIAGNOSIS — Z86718 Personal history of other venous thrombosis and embolism: Secondary | ICD-10-CM

## 2021-04-05 DIAGNOSIS — N4889 Other specified disorders of penis: Secondary | ICD-10-CM | POA: Diagnosis present

## 2021-04-05 DIAGNOSIS — Z7901 Long term (current) use of anticoagulants: Secondary | ICD-10-CM

## 2021-04-05 DIAGNOSIS — R410 Disorientation, unspecified: Secondary | ICD-10-CM | POA: Diagnosis present

## 2021-04-05 DIAGNOSIS — S301XXA Contusion of abdominal wall, initial encounter: Secondary | ICD-10-CM | POA: Diagnosis present

## 2021-04-05 DIAGNOSIS — Z79890 Hormone replacement therapy (postmenopausal): Secondary | ICD-10-CM

## 2021-04-05 DIAGNOSIS — Z7902 Long term (current) use of antithrombotics/antiplatelets: Secondary | ICD-10-CM

## 2021-04-05 DIAGNOSIS — M7981 Nontraumatic hematoma of soft tissue: Secondary | ICD-10-CM | POA: Diagnosis present

## 2021-04-05 DIAGNOSIS — Z955 Presence of coronary angioplasty implant and graft: Secondary | ICD-10-CM

## 2021-04-05 DIAGNOSIS — C61 Malignant neoplasm of prostate: Secondary | ICD-10-CM | POA: Diagnosis present

## 2021-04-05 DIAGNOSIS — R310 Gross hematuria: Secondary | ICD-10-CM | POA: Diagnosis present

## 2021-04-05 DIAGNOSIS — Z66 Do not resuscitate: Secondary | ICD-10-CM | POA: Diagnosis present

## 2021-04-05 DIAGNOSIS — Z85828 Personal history of other malignant neoplasm of skin: Secondary | ICD-10-CM

## 2021-04-05 DIAGNOSIS — J45909 Unspecified asthma, uncomplicated: Secondary | ICD-10-CM | POA: Diagnosis present

## 2021-04-05 DIAGNOSIS — Z8249 Family history of ischemic heart disease and other diseases of the circulatory system: Secondary | ICD-10-CM

## 2021-04-05 DIAGNOSIS — R296 Repeated falls: Secondary | ICD-10-CM | POA: Diagnosis present

## 2021-04-05 DIAGNOSIS — W1830XA Fall on same level, unspecified, initial encounter: Secondary | ICD-10-CM | POA: Diagnosis present

## 2021-04-05 DIAGNOSIS — N183 Chronic kidney disease, stage 3 unspecified: Secondary | ICD-10-CM | POA: Diagnosis present

## 2021-04-05 DIAGNOSIS — G20 Parkinson's disease: Secondary | ICD-10-CM | POA: Diagnosis present

## 2021-04-05 DIAGNOSIS — G9341 Metabolic encephalopathy: Secondary | ICD-10-CM | POA: Diagnosis present

## 2021-04-05 DIAGNOSIS — T45515A Adverse effect of anticoagulants, initial encounter: Secondary | ICD-10-CM | POA: Diagnosis present

## 2021-04-05 DIAGNOSIS — Z20822 Contact with and (suspected) exposure to covid-19: Secondary | ICD-10-CM | POA: Diagnosis present

## 2021-04-05 DIAGNOSIS — I6340 Cerebral infarction due to embolism of unspecified cerebral artery: Principal | ICD-10-CM

## 2021-04-05 DIAGNOSIS — I129 Hypertensive chronic kidney disease with stage 1 through stage 4 chronic kidney disease, or unspecified chronic kidney disease: Secondary | ICD-10-CM | POA: Diagnosis present

## 2021-04-05 MED ORDER — LACTATED RINGERS IV BOLUS
1000.0000 mL | Freq: Once | INTRAVENOUS | Status: AC
  Administered 2021-04-06: 1000 mL via INTRAVENOUS

## 2021-04-05 NOTE — ED Provider Notes (Signed)
?[facility] ?Provider Note ? ? ?CSN: [id number] ?Arrival date & time: 03/26/2021  2259 ? ?  ? ?History ? ?Chief Complaint  ?Patient presents with  ? Fall  ?  Eliquis/Plavix  ? ? ?[name] is a 88 y.o. male. ? ?88 year old male that presents to the ER today after reported fall.  Patient apparently was recently in the hospital couple times for what started as a viral pneumonia and thought to be bacterial pneumonia and was just discharged yesterday.  Apparently patient was on the floor and yelled for help.  No one saw him fall nones clear on how he ended up on the floor.  Patient has dementia.  Very inconsistent with his history. ? ?Discussed with his daughter who is the healthcare power of attorney she states that patient was supposed to be checked on every 30 minutes and she wonders why this did not happen.  She has not seen the patient in many months or years and states that the sister saw him earlier today ? ?I discussed with the sister she said that earlier today he did not have any obvious neurologic deficits.  She states he did not have any facial asymmetry that she noticed.  She states that his mental status was such that he remembered her and to carry on a conversation.  Does have memory issues but does not psych actually has severe dementia at this time ? ? ?Fall ? ? ?  ? ?Home Medications ?Prior to Admission medications   ?Medication Sig Start Date End Date Taking? Authorizing Provider  ?alfuzosin (UROXATRAL) 10 MG 24 hr tablet Take 1 tablet (10 mg total) by mouth at bedtime. ?Patient taking differently: Take 10 mg by mouth every evening. 02/10/16  Yes Kathie Rhodes, MD  ?amiodarone (PACERONE) 200 MG tablet Take 1 tablet (200 mg total) by mouth daily. 08/17/19  Yes Dwyane Dee, MD  ?apixaban (ELIQUIS) 2.5 MG TABS tablet Take 2.5 mg by mouth 2 (two) times daily.   Yes [provider]  ?brimonidine-timolol (COMBIGAN) 0.2-0.5 % ophthalmic solution Place 1 drop  into both eyes 2 (two) times daily.   Yes [provider]  ?brinzolamide (AZOPT) 1 % ophthalmic suspension Place 1 drop into the right eye 3 (three) times daily.   Yes [provider]  ?carbidopa-levodopa (SINEMET IR) 25-100 MG tablet Take 1 tablet by mouth 3 (three) times daily. 0800, 1400, and 2000   Yes [provider]  ?Carboxymethylcellulose Sodium (ARTIFICIAL TEARS OP) Place 1 drop into both eyes 4 (four) times daily.   Yes [provider]  ?clopidogrel (PLAVIX) 75 MG tablet Take 75 mg by mouth daily.    Yes [provider]  ?docusate sodium (COLACE) 100 MG capsule Take 100 mg by mouth 2 (two) times daily.    Yes [provider]  ?ferrous sulfate 325 (65 FE) MG EC tablet Take 1 tablet (325 mg total) by mouth 2 (two) times daily with a meal. 11/14/18 05/17/21 Yes Danford, Suann Larry, MD  ?fluticasone (FLONASE) 50 MCG/ACT nasal spray Place 2 sprays into both nostrils daily.   Yes [provider]  ?furosemide (LASIX) 20 MG tablet Take 20 mg by mouth in the morning.   Yes [provider]  ?latanoprost (XALATAN) 0.005 % ophthalmic solution Place 1 drop into the right eye at bedtime.  07/28/16  Yes [provider]  ?levothyroxine (SYNTHROID) 150 MCG tablet Take 150 mcg by mouth daily.    Yes [provider]  ?  loratadine (CLARITIN) 10 MG tablet Take 10 mg by mouth at bedtime.   Yes [provider]  ?Melatonin 3 MG SUBL Place 3 mg under the tongue at bedtime. 11/11/18  Yes [provider]  ?Meth-Hyo-M Bl-Na Phos-Ph Sal (URO-MP) 118 MG CAPS Take 1 capsule by mouth 3 (three) times daily.   Yes [provider]  ?metoprolol succinate (TOPROL-XL) 25 MG 24 hr tablet Take 1 tablet (25 mg total) by mouth daily. 08/06/19  Yes Dwyane Dee, MD  ?mirabegron ER (MYRBETRIQ) 50 MG TB24 tablet Take 1 tablet (50 mg total) by mouth daily. 01/28/16  Yes McKenzie, Candee Furbish, MD  ?Multiple Vitamins-Minerals  (PRESERVISION/LUTEIN) CAPS Take 1 capsule by mouth 2 (two) times daily.   Yes [provider]  ?pantoprazole (PROTONIX) 40 MG tablet Take 40 mg by mouth 2 (two) times daily. 06/29/19  Yes [provider]  ?polyethylene glycol (MIRALAX / GLYCOLAX) packet Take 17 g by mouth daily.   Yes [provider]  ?potassium chloride SA (KLOR-CON M) 20 MEQ tablet Take 20 mEq by mouth daily. 03/28/21  Yes [provider]  ?QUEtiapine (SEROQUEL) 25 MG tablet Take 1 tablet (25 mg total) by mouth at bedtime. 04/04/21  Yes Dessa Phi, DO  ?sertraline (ZOLOFT) 25 MG tablet Take 25 mg by mouth daily.   Yes [provider]  ?Cholecalciferol (VITAMIN D-3) 5000 units TABS Take 5,000 Units by mouth daily.    [provider]  ?Dextrose, Diabetic Use, (INSTA-GLUCOSE PO) Take 1 Tube by mouth as needed (hypoglycemia or BS <60). ?Patient not taking: Reported on 04/06/2021    [provider]  ?diltiazem (CARDIZEM CD) 120 MG 24 hr capsule Take 1 capsule (120 mg total) by mouth daily for 30 days. ?Patient not taking: Reported on 04/06/2021 03/12/18   Elodia Florence., MD  ?traMADol (ULTRAM) 50 MG tablet Take 1 tablet (50 mg total) by mouth every 6 (six) hours as needed for moderate pain. ?Patient not taking: Reported on 04/06/2021 04/04/21   Dessa Phi, DO  ?   ? ?Allergies    ?Colchicine, Hibiclens [chlorhexidine], Atorvastatin, Celebrex [celecoxib], Codeine, Erythromycin, Ezetimibe, Iodinated contrast media, Oxycodone-acetaminophen, Simvastatin, Crestor [rosuvastatin], Erythromycin, Hydroxychloroquine, Nitroglycerin, Protonix [pantoprazole], Tetanus toxoids, Cefpodoxime, Cefuroxime axetil, Nitroglycerin, and Vantin   ? ?Review of Systems   ?Review of Systems ? ?Physical Exam ?Updated Vital Signs ?BP (!) 163/80   Pulse 71   Temp 98.8 ?F (37.1 ?C) (Oral)   Resp 15   SpO2 98%  ?Physical Exam ?Vitals and nursing note reviewed.  ?Constitutional:   ?   Appearance: He is  well-developed.  ?HENT:  ?   Head: Normocephalic and atraumatic.  ?   Mouth/Throat:  ?   Mouth: Mucous membranes are moist.  ?   Pharynx: Oropharynx is clear.  ?Eyes:  ?   Pupils: Pupils are equal, round, and reactive to light.  ?Cardiovascular:  ?   Rate and Rhythm: Normal rate.  ?Pulmonary:  ?   Effort: Pulmonary effort is normal. No respiratory distress.  ?Abdominal:  ?   General: Abdomen is flat. There is no distension.  ?Musculoskeletal:     ?   General: Normal range of motion.  ?   Cervical back: Normal range of motion.  ?Neurological:  ?   Mental Status: He is alert.  ?   Cranial Nerves: Cranial nerve deficit (Mild left facial droop as evidenced by his mouth sagging.  Tongue protrudes at midline.  Able to raise eyebrows symmetrically.  Mouth  is also dry.) present.  ?   Sensory: No sensory deficit.  ?   Motor: No weakness.  ?   Coordination: Coordination normal.  ?   Comments: Sounds like his memory is at or near baseline.  ? ? ?ED Results / Procedures / Treatments   ?Labs ?(all labs ordered are listed, but only abnormal results are displayed) ?Labs Reviewed  ?CBC WITH DIFFERENTIAL/PLATELET - Abnormal; Notable for the following components:  ?    Result Value  ? RBC 2.77 (*)   ? Hemoglobin 8.8 (*)   ? HCT 27.1 (*)   ? Neutro Abs 8.6 (*)   ? Lymphs Abs 0.6 (*)   ? Abs Immature Granulocytes 0.08 (*)   ? All other components within normal limits  ?COMPREHENSIVE METABOLIC PANEL - Abnormal; Notable for the following components:  ? Sodium 132 (*)   ? Glucose, Bld 202 (*)   ? BUN 26 (*)   ? Creatinine, Ser 1.69 (*)   ? Calcium 7.9 (*)   ? Total Protein 5.2 (*)   ? Albumin 2.6 (*)   ? GFR, Estimated 39 (*)   ? All other components within normal limits  ?BRAIN NATRIURETIC PEPTIDE - Abnormal; Notable for the following components:  ? B Natriuretic Peptide 391.3 (*)   ? All other components within normal limits  ?URINALYSIS, ROUTINE W REFLEX MICROSCOPIC  ? ? ?EKG ?None ? ?Radiology ?DG Thoracic Spine 2 View ? ?Result Date:  04/06/2021 ?CLINICAL DATA:  Status post fall. EXAM: THORACIC SPINE 2 VIEWS COMPARISON:  None. FINDINGS: An ill-defined fracture deformity is suspected along the superior endplate of the O03 vertebral body. A chronic fracture of t

## 2021-04-05 NOTE — ED Notes (Signed)
..Trauma Response Nurse Documentation ? ? ?[name] is a 88 y.o. male arriving to [facility] ED via [facility] ? ?On clopidogrel 75 mg daily and Eliquis (apixaban) daily. Trauma was activated as a Level 2 by charge nurse based on the following trauma criteria Elderly patients > 65 with head trauma on anti-coagulation (excluding ASA). Trauma team at the bedside on patient arrival. Patient cleared for CT by Dr. Dolly Rias.  ? ?History  ? Past Medical History:  ?Diagnosis Date  ? Allergic rhinitis   ? Arthritis   ? arthritis-kyphosis  ? Asthmatic bronchitis   ? CAD in native artery   ? a. STEMI 07/2017 s/p DES to LCx (25% mRCA and 70% oOM1 residual).  ? CKD (chronic kidney disease) stage 3, GFR 30-59 ml/min (HCC)   ? DM type 2 (diabetes mellitus, type 2) ([place])   ? DVT, lower extremity, proximal, acute ([place])   ? Dyslipidemia   ? GERD (gastroesophageal reflux disease)   ? Hearing impaired   ? bilateral hearing aids  ? History of kidney stones   ? History of skin cancer   ? Hypertension   ? Hypothyroidism   ? Macular degeneration   ? injections done bilaterally recent - 08-22-13  ? Normocytic anemia   ? PAF (paroxysmal atrial fibrillation) ([place])   ? Parkinson's disease ([place])   ? Prostate cancer ([place])   ? Prostate cancer-radiation only,skin cancer-basal cell and last squamous cell right eye  ? Statin intolerance   ? Stroke [facility])   ? Urgency-frequency syndrome   ? Urticaria   ?  ? Past Surgical History:  ?Procedure Laterality Date  ? BACK SURGERY    ? lumbar fracture  ? cataract surgery Bilateral   ? COLONOSCOPY W/ POLYPECTOMY    ? x2   ? CORONARY STENT INTERVENTION N/A 08/06/2017  ? Procedure: CORONARY STENT INTERVENTION;  Surgeon: Jettie Booze, MD;  Location: [facility];  Service: Cardiovascular;  Laterality: N/A;  ? CYSTOSCOPY W/ URETERAL STENT PLACEMENT Right 08/13/2017  ? Procedure: CYSTOSCOPY WITH RIGHT  RETROGRADE PYELOGRAM/URETERAL STENT PLACEMENT;  Surgeon: Raynelle Bring, MD;  Location: [facility];  Service:  Urology;  Laterality: Right;  ? CYSTOSCOPY WITH INJECTION N/A 09/11/2013  ? Procedure: CYSTOSCOPY WITH BOTOX INJECTION INTO THE BLADDER;  Surgeon: Ailene Rud, MD;  Location: [facility];  Service: Urology;  Laterality: N/A;  ? CYSTOSCOPY WITH RETROGRADE PYELOGRAM, URETEROSCOPY AND STENT PLACEMENT    ? CYSTOSCOPY WITH RETROGRADE PYELOGRAM, URETEROSCOPY AND STENT PLACEMENT Right 12/22/2018  ? Procedure: CYSTOSCOPY WITH RETROGRADE PYELOGRAM, URETEROSCOPY AND STENT PLACEMENT;  Surgeon: Cleon Gustin, MD;  Location: [facility];  Service: Urology;  Laterality: Right;  90 MINS  ? CYSTOSCOPY WITH STENT PLACEMENT Right 01/26/2016  ? Procedure: CYSTOSCOPY, RIGHT RETROGRADE WITH RIGHT URETERAL STENT PLACEMENT;  Surgeon: Cleon Gustin, MD;  Location: [facility];  Service: Urology;  Laterality: Right;  ? CYSTOSCOPY/RETROGRADE/URETEROSCOPY Right 04/22/2016  ? Procedure: CYSTOSCOPY/RETROGRADE/ REMOVAL JJ STENT right insertion stent right insertion of foley;  Surgeon: Franchot Gallo, MD;  Location: [facility];  Service: Urology;  Laterality: Right;  ? EXTRACORPOREAL SHOCK WAVE LITHOTRIPSY Right 02/27/2016  ? Procedure: RIGHT EXTRACORPOREAL SHOCK WAVE LITHOTRIPSY (ESWL) GAITED;  Surgeon: Cleon Gustin, MD;  Location: [facility];  Service: Urology;  Laterality: Right;  ? EXTRACORPOREAL SHOCK WAVE LITHOTRIPSY Right 02/10/2016  ? Procedure: EXTRACORPOREAL SHOCK WAVE LITHOTRIPSY (ESWL);  Surgeon: Kathie Rhodes, MD;  Location: [facility];  Service: Urology;  Laterality: Right;  [name] [phone] ?[id number] ?  BCBS- [id number] ?  ? EYE SURGERY    ? macular degeneration  ? GALLBLADDER SURGERY  2006  ? no cholecystectomy  ? HERNIA REPAIR    ? HOLMIUM LASER APPLICATION Right 59/97/7414  ? Procedure: HOLMIUM LASER APPLICATION;  Surgeon: Cleon Gustin, MD;  [facility];  Service: Urology;  Laterality: Right;  ? LEFT HEART CATH AND CORONARY ANGIOGRAPHY N/A 08/06/2017  ? Procedure: LEFT HEART CATH AND CORONARY  ANGIOGRAPHY;  Surgeon: Jettie Booze, MD;  Location: [facility];  Service: Cardiovascular;  Laterality: N/A;  ? LUNG REMOVAL, PARTIAL  age 25  ? for bronchiectasis  ? PARTIAL THYMECTOMY  1985  ? for nodules  ? POPLITEAL SYNOVIAL CYST EXCISION  2005  ? TOTAL KNEE ARTHROPLASTY  2008  ? right  ? TOTAL KNEE ARTHROPLASTY  2010  ? left  ?  ? ? ? ?Initial Focused Assessment (If applicable, or please see trauma documentation): ?No head trauma noted, no visible trauma ? ?CT's Completed:   ? ? ?Interventions:  ?Initial trauma assessment.  ? ?Plan for disposition:  ?Medical workup pending ? ? ? ?Event Summary: ?Pt transported from SNF with reports of multiple falls recently, no obvious signs of trauma.   ?2310  Downgrade to non trauma per [place] ? ? ? ?Bedside handoff with ED RN Kandee Keen, RN.   ? ?Andilyn Bettcher Dee  ?Trauma Response RN ? ?Please call TRN at [phone] for further assistance. ?  ?

## 2021-04-05 NOTE — Progress Notes (Signed)
Orthopedic Tech Progress Note ?Patient Details:  ?[name] ?Nov 11, 1931 ?[id number] ? ?Patient ID: [name], [name]   DOB: 12-26-1931, 88 y.o.   MRN: [id number] ?I attended trauma page. ?Karolee Stamps ?04/04/2021, 11:21 PM ? ?

## 2021-04-05 NOTE — ED Triage Notes (Signed)
Patient arrived via [facility] to be evaluated for fall, on Eliquis and Plavix. Per EMS report that patient fell and call for help and he found on the floor. Unknown if patient hit head. No obvious head trauma noted.  ?

## 2021-04-06 ENCOUNTER — Emergency Department (HOSPITAL_COMMUNITY): Payer: Medicare Other

## 2021-04-06 ENCOUNTER — Inpatient Hospital Stay (HOSPITAL_COMMUNITY): Payer: Medicare Other

## 2021-04-06 DIAGNOSIS — R310 Gross hematuria: Secondary | ICD-10-CM

## 2021-04-06 DIAGNOSIS — G309 Alzheimer's disease, unspecified: Secondary | ICD-10-CM

## 2021-04-06 DIAGNOSIS — I639 Cerebral infarction, unspecified: Secondary | ICD-10-CM | POA: Diagnosis present

## 2021-04-06 DIAGNOSIS — Z515 Encounter for palliative care: Secondary | ICD-10-CM | POA: Diagnosis present

## 2021-04-06 DIAGNOSIS — Y92122 Bedroom in nursing home as the place of occurrence of the external cause: Secondary | ICD-10-CM | POA: Diagnosis not present

## 2021-04-06 DIAGNOSIS — J45909 Unspecified asthma, uncomplicated: Secondary | ICD-10-CM | POA: Diagnosis present

## 2021-04-06 DIAGNOSIS — Z823 Family history of stroke: Secondary | ICD-10-CM | POA: Diagnosis not present

## 2021-04-06 DIAGNOSIS — I2510 Atherosclerotic heart disease of native coronary artery without angina pectoris: Secondary | ICD-10-CM | POA: Diagnosis present

## 2021-04-06 DIAGNOSIS — R29715 NIHSS score 15: Secondary | ICD-10-CM | POA: Diagnosis present

## 2021-04-06 DIAGNOSIS — R0989 Other specified symptoms and signs involving the circulatory and respiratory systems: Secondary | ICD-10-CM

## 2021-04-06 DIAGNOSIS — I129 Hypertensive chronic kidney disease with stage 1 through stage 4 chronic kidney disease, or unspecified chronic kidney disease: Secondary | ICD-10-CM | POA: Diagnosis present

## 2021-04-06 DIAGNOSIS — S301XXA Contusion of abdominal wall, initial encounter: Secondary | ICD-10-CM | POA: Diagnosis present

## 2021-04-06 DIAGNOSIS — Z86718 Personal history of other venous thrombosis and embolism: Secondary | ICD-10-CM | POA: Diagnosis not present

## 2021-04-06 DIAGNOSIS — N4889 Other specified disorders of penis: Secondary | ICD-10-CM

## 2021-04-06 DIAGNOSIS — W1830XA Fall on same level, unspecified, initial encounter: Secondary | ICD-10-CM | POA: Diagnosis present

## 2021-04-06 DIAGNOSIS — Z66 Do not resuscitate: Secondary | ICD-10-CM | POA: Diagnosis present

## 2021-04-06 DIAGNOSIS — Z85828 Personal history of other malignant neoplasm of skin: Secondary | ICD-10-CM | POA: Diagnosis not present

## 2021-04-06 DIAGNOSIS — W19XXXA Unspecified fall, initial encounter: Secondary | ICD-10-CM | POA: Diagnosis not present

## 2021-04-06 DIAGNOSIS — I252 Old myocardial infarction: Secondary | ICD-10-CM | POA: Diagnosis not present

## 2021-04-06 DIAGNOSIS — Z8249 Family history of ischemic heart disease and other diseases of the circulatory system: Secondary | ICD-10-CM | POA: Diagnosis not present

## 2021-04-06 DIAGNOSIS — F0280 Dementia in other diseases classified elsewhere without behavioral disturbance: Secondary | ICD-10-CM | POA: Diagnosis present

## 2021-04-06 DIAGNOSIS — C61 Malignant neoplasm of prostate: Secondary | ICD-10-CM | POA: Diagnosis present

## 2021-04-06 DIAGNOSIS — Z20822 Contact with and (suspected) exposure to covid-19: Secondary | ICD-10-CM | POA: Diagnosis present

## 2021-04-06 DIAGNOSIS — G20 Parkinson's disease: Secondary | ICD-10-CM

## 2021-04-06 DIAGNOSIS — R410 Disorientation, unspecified: Secondary | ICD-10-CM

## 2021-04-06 DIAGNOSIS — E039 Hypothyroidism, unspecified: Secondary | ICD-10-CM | POA: Diagnosis present

## 2021-04-06 DIAGNOSIS — I6389 Other cerebral infarction: Secondary | ICD-10-CM | POA: Diagnosis present

## 2021-04-06 DIAGNOSIS — R29810 Facial weakness: Secondary | ICD-10-CM | POA: Diagnosis present

## 2021-04-06 DIAGNOSIS — N183 Chronic kidney disease, stage 3 unspecified: Secondary | ICD-10-CM | POA: Diagnosis present

## 2021-04-06 DIAGNOSIS — Y92129 Unspecified place in nursing home as the place of occurrence of the external cause: Secondary | ICD-10-CM

## 2021-04-06 DIAGNOSIS — E1122 Type 2 diabetes mellitus with diabetic chronic kidney disease: Secondary | ICD-10-CM | POA: Diagnosis present

## 2021-04-06 DIAGNOSIS — Z789 Other specified health status: Secondary | ICD-10-CM

## 2021-04-06 DIAGNOSIS — G9341 Metabolic encephalopathy: Secondary | ICD-10-CM | POA: Diagnosis present

## 2021-04-06 DIAGNOSIS — Z955 Presence of coronary angioplasty implant and graft: Secondary | ICD-10-CM | POA: Diagnosis not present

## 2021-04-06 DIAGNOSIS — G936 Cerebral edema: Secondary | ICD-10-CM | POA: Diagnosis present

## 2021-04-06 DIAGNOSIS — R451 Restlessness and agitation: Secondary | ICD-10-CM

## 2021-04-06 LAB — COMPREHENSIVE METABOLIC PANEL
ALT: 7 U/L (ref 0–44)
AST: 18 U/L (ref 15–41)
Albumin: 2.6 g/dL — ABNORMAL LOW (ref 3.5–5.0)
Alkaline Phosphatase: 101 U/L (ref 38–126)
Anion gap: 5 (ref 5–15)
BUN: 26 mg/dL — ABNORMAL HIGH (ref 8–23)
CO2: 25 mmol/L (ref 22–32)
Calcium: 7.9 mg/dL — ABNORMAL LOW (ref 8.9–10.3)
Chloride: 102 mmol/L (ref 98–111)
Creatinine, Ser: 1.69 mg/dL — ABNORMAL HIGH (ref 0.61–1.24)
GFR, Estimated: 39 mL/min — ABNORMAL LOW (ref >60)
Glucose, Bld: 202 mg/dL — ABNORMAL HIGH (ref 70–99)
Potassium: 4.7 mmol/L (ref 3.5–5.1)
Sodium: 132 mmol/L — ABNORMAL LOW (ref 135–145)
Total Bilirubin: 1 mg/dL (ref 0.3–1.2)
Total Protein: 5.2 g/dL — ABNORMAL LOW (ref 6.5–8.1)

## 2021-04-06 LAB — CBC
HCT: 26.2 % — ABNORMAL LOW (ref 39.0–52.0)
Hemoglobin: 8.3 g/dL — ABNORMAL LOW (ref 13.0–17.0)
MCH: 31.3 pg (ref 26.0–34.0)
MCHC: 31.7 g/dL (ref 30.0–36.0)
MCV: 98.9 fL (ref 80.0–100.0)
Platelets: 282 10*3/uL (ref 150–400)
RBC: 2.65 MIL/uL — ABNORMAL LOW (ref 4.22–5.81)
RDW: 13.8 % (ref 11.5–15.5)
WBC: 8.4 10*3/uL (ref 4.0–10.5)
nRBC: 0 % (ref 0.0–0.2)

## 2021-04-06 LAB — CBC WITH DIFFERENTIAL/PLATELET
Abs Immature Granulocytes: 0.08 10*3/uL — ABNORMAL HIGH (ref 0.00–0.07)
Basophils Absolute: 0 10*3/uL (ref 0.0–0.1)
Basophils Relative: 0 %
Eosinophils Absolute: 0 10*3/uL (ref 0.0–0.5)
Eosinophils Relative: 0 %
HCT: 27.1 % — ABNORMAL LOW (ref 39.0–52.0)
Hemoglobin: 8.8 g/dL — ABNORMAL LOW (ref 13.0–17.0)
Immature Granulocytes: 1 %
Lymphocytes Relative: 6 %
Lymphs Abs: 0.6 10*3/uL — ABNORMAL LOW (ref 0.7–4.0)
MCH: 31.8 pg (ref 26.0–34.0)
MCHC: 32.5 g/dL (ref 30.0–36.0)
MCV: 97.8 fL (ref 80.0–100.0)
Monocytes Absolute: 0.5 10*3/uL (ref 0.1–1.0)
Monocytes Relative: 5 %
Neutro Abs: 8.6 10*3/uL — ABNORMAL HIGH (ref 1.7–7.7)
Neutrophils Relative %: 88 %
Platelets: 287 10*3/uL (ref 150–400)
RBC: 2.77 MIL/uL — ABNORMAL LOW (ref 4.22–5.81)
RDW: 13.7 % (ref 11.5–15.5)
WBC: 9.7 10*3/uL (ref 4.0–10.5)
nRBC: 0 % (ref 0.0–0.2)

## 2021-04-06 LAB — BRAIN NATRIURETIC PEPTIDE: B Natriuretic Peptide: 391.3 pg/mL — ABNORMAL HIGH (ref 0.0–100.0)

## 2021-04-06 MED ORDER — ACETAMINOPHEN 500 MG PO TABS
ORAL_TABLET | ORAL | Status: AC
  Filled 2021-04-06: qty 1

## 2021-04-06 MED ORDER — LACTATED RINGERS IV BOLUS
1000.0000 mL | Freq: Once | INTRAVENOUS | Status: AC
  Administered 2021-04-06: 1000 mL via INTRAVENOUS

## 2021-04-06 MED ORDER — LORAZEPAM 1 MG PO TABS
1.0000 mg | ORAL_TABLET | Freq: Once | ORAL | Status: AC
  Administered 2021-04-06: 1 mg via ORAL
  Filled 2021-04-06: qty 1

## 2021-04-06 MED ORDER — LORAZEPAM 2 MG/ML IJ SOLN
1.0000 mg | INTRAMUSCULAR | Status: DC | PRN
  Administered 2021-04-06: 1 mg via INTRAVENOUS
  Filled 2021-04-06: qty 1

## 2021-04-06 MED ORDER — CARBIDOPA-LEVODOPA 25-100 MG PO TABS: 1.0000 | ORAL_TABLET | Freq: Three times a day (TID) | ORAL | Status: DC

## 2021-04-06 MED ORDER — ACETAMINOPHEN 650 MG RE SUPP: 650.0000 mg | Freq: Four times a day (QID) | RECTAL | Status: DC | PRN

## 2021-04-06 MED ORDER — HALOPERIDOL LACTATE 5 MG/ML IJ SOLN: 2.0000 mg | Freq: Four times a day (QID) | INTRAMUSCULAR | Status: DC | PRN

## 2021-04-06 MED ORDER — LORAZEPAM 1 MG PO TABS: 1.0000 mg | ORAL_TABLET | ORAL | Status: DC | PRN

## 2021-04-06 MED ORDER — BIOTENE DRY MOUTH MT LIQD
15.0000 mL | Freq: Two times a day (BID) | OROMUCOSAL | Status: DC
  Administered 2021-04-06: 15 mL via TOPICAL

## 2021-04-06 MED ORDER — HALOPERIDOL LACTATE 2 MG/ML PO CONC
0.5000 mg | ORAL | Status: DC | PRN
  Filled 2021-04-06: qty 0.3

## 2021-04-06 MED ORDER — HALOPERIDOL 0.5 MG PO TABS
0.5000 mg | ORAL_TABLET | ORAL | Status: DC | PRN
  Filled 2021-04-06: qty 1

## 2021-04-06 MED ORDER — GLYCOPYRROLATE 0.2 MG/ML IJ SOLN
0.2000 mg | INTRAMUSCULAR | Status: DC | PRN
Start: 2021-04-06 — End: 2021-04-07

## 2021-04-06 MED ORDER — GLYCOPYRROLATE 0.2 MG/ML IJ SOLN: 0.2000 mg | INTRAMUSCULAR | Status: DC | PRN

## 2021-04-06 MED ORDER — DIPHENHYDRAMINE HCL 50 MG/ML IJ SOLN: 12.5000 mg | INTRAMUSCULAR | Status: DC | PRN

## 2021-04-06 MED ORDER — MORPHINE 100MG IN NS 100ML (1MG/ML) PREMIX INFUSION
5.0000 mg/h | INTRAVENOUS | Status: DC
  Administered 2021-04-06: 5 mg/h via INTRAVENOUS
  Filled 2021-04-06: qty 100

## 2021-04-06 MED ORDER — ONDANSETRON HCL 4 MG/2ML IJ SOLN: 4.0000 mg | Freq: Four times a day (QID) | INTRAMUSCULAR | Status: DC | PRN

## 2021-04-06 MED ORDER — CARBIDOPA-LEVODOPA 25-100 MG PO TABS
1.0000 | ORAL_TABLET | Freq: Three times a day (TID) | ORAL | Status: DC
  Administered 2021-04-06: 1 via ORAL
  Filled 2021-04-06: qty 1

## 2021-04-06 MED ORDER — GLYCOPYRROLATE 1 MG PO TABS
1.0000 mg | ORAL_TABLET | ORAL | Status: DC | PRN
  Filled 2021-04-06: qty 1

## 2021-04-06 MED ORDER — GLYCOPYRROLATE 0.2 MG/ML IJ SOLN
0.4000 mg | Freq: Once | INTRAMUSCULAR | Status: AC
  Administered 2021-04-06: 0.4 mg via INTRAVENOUS
  Filled 2021-04-06: qty 2

## 2021-04-06 MED ORDER — ONDANSETRON 4 MG PO TBDP: 4.0000 mg | ORAL_TABLET | Freq: Four times a day (QID) | ORAL | Status: DC | PRN

## 2021-04-06 MED ORDER — HALOPERIDOL LACTATE 5 MG/ML IJ SOLN: 0.5000 mg | INTRAMUSCULAR | Status: DC | PRN

## 2021-04-06 MED ORDER — QUETIAPINE FUMARATE 25 MG PO TABS
25.0000 mg | ORAL_TABLET | Freq: Every day | ORAL | Status: DC
  Filled 2021-04-06: qty 1

## 2021-04-06 MED ORDER — LORAZEPAM 2 MG/ML PO CONC: 1.0000 mg | ORAL | Status: DC | PRN

## 2021-04-06 MED ORDER — POLYVINYL ALCOHOL 1.4 % OP SOLN
1.0000 [drp] | Freq: Four times a day (QID) | OPHTHALMIC | Status: DC | PRN
  Filled 2021-04-06: qty 15

## 2021-04-06 MED ORDER — HALOPERIDOL LACTATE 2 MG/ML PO CONC
2.0000 mg | Freq: Four times a day (QID) | ORAL | Status: DC | PRN
Start: 2021-04-06 — End: 2021-04-07

## 2021-04-06 MED ORDER — MORPHINE BOLUS VIA INFUSION
2.0000 mg | INTRAVENOUS | Status: DC | PRN
  Filled 2021-04-06: qty 2

## 2021-04-06 MED ORDER — HALOPERIDOL 1 MG PO TABS: 2.0000 mg | ORAL_TABLET | Freq: Four times a day (QID) | ORAL | Status: DC | PRN

## 2021-04-06 MED ORDER — GLYCOPYRROLATE 0.2 MG/ML IJ SOLN
0.2000 mg | Freq: Four times a day (QID) | INTRAMUSCULAR | Status: DC
  Administered 2021-04-07: 0.2 mg via INTRAVENOUS
  Filled 2021-04-06: qty 1

## 2021-04-06 MED ORDER — ACETAMINOPHEN 325 MG PO TABS: 650.0000 mg | ORAL_TABLET | Freq: Four times a day (QID) | ORAL | Status: DC | PRN

## 2021-04-06 MED ORDER — LORAZEPAM 2 MG/ML IJ SOLN
0.5000 mg | Freq: Once | INTRAMUSCULAR | Status: AC
  Administered 2021-04-06: 0.5 mg via INTRAVENOUS
  Filled 2021-04-06: qty 1

## 2021-04-06 MED ORDER — BIOTENE DRY MOUTH MT LIQD: 15.0000 mL | OROMUCOSAL | Status: DC | PRN

## 2021-04-06 MED ORDER — ACETAMINOPHEN 500 MG PO TABS
1000.0000 mg | ORAL_TABLET | Freq: Once | ORAL | Status: AC
  Administered 2021-04-06: 1000 mg via ORAL
  Filled 2021-04-06: qty 2

## 2021-04-06 MED ORDER — LORAZEPAM 2 MG/ML IJ SOLN: 1.0000 mg | INTRAMUSCULAR | Status: DC | PRN

## 2021-04-06 NOTE — ED Notes (Signed)
RN called 6E, IP RN unable to take report at this time. Will call back  ?

## 2021-04-06 NOTE — Plan of Care (Signed)
?  Problem: Respiratory: ?Goal: Verbalizations of increased ease of respirations will increase ?Outcome: Progressing ?  ?Problem: Coping: ?Goal: Ability to identify and develop effective coping behavior will improve ?Outcome: Progressing ?  ?Problem: Pain Management: ?Goal: Satisfaction with pain management regimen will improve ?Outcome: Progressing ?  ?

## 2021-04-06 NOTE — ED Provider Notes (Addendum)
I assumed care of the patient at 7:30 AM from Dr. Dayna Barker.  Patient with altered mental status and new facial droop that started sometime after yesterday afternoon as his sister was with him at that time and reported that his speech and face were normal.  Patient was found on the floor this morning at his facility.  He was just discharged yesterday after having viral pneumonia that turned into bacterial pneumonia.  Upon arrival here patient still had some mild facial droop and was mildly altered but was able to carry on a conversation without history of memory issues.  Patient had labs that were at baseline he is on Eliquis and had a head CT that showed no acute findings.  He is currently waiting on MRIs to ensure no acute fracture in the thoracic spine as well as no evidence of acute stroke.  MRIs have returned and patient has 2 subacute versus chronic lacunar infarcts on his MRI and thoracic spine is negative.  Will discuss with neurology for further recommendations.  On reexam of the patient he is able to answer questions.  Slight facial droop noted on the left but he is still able to raise his eyebrows and he can still answer questions.  Patient is having frank hematuria which is unclear how long that has been going on but his hemoglobin is stable at this time. ? ?12:20 PM ?Dr. Theda Sers evaluated the patient and the strokes to seem to be 66 to 34 days old.  Patient's A1c is at baseline he will need to have some lipid lowering but he is already on Plavix and Eliquis.  He does need an echo however an unknown clear we would be able to get that within a few days.  Will admit for observation, completion of his stroke work-up before patient is discharged home.  He does continue to have hematuria however that will just need to be monitored and hemoglobin currently is stable but will most likely need to have a repeat hemoglobin to ensure no change. ? ?1:04 PM ?Patient continues to have hematuria and has a large hematoma  to the left side/flank.  We will do a CT to ensure no renal hematoma.  He does not have prior history of hematuria. ?  Blanchie Dessert, MD ?04/06/21 1221 ? ?  ?Blanchie Dessert, MD ?04/06/21 1305 ? ?

## 2021-04-06 NOTE — ED Notes (Signed)
MRI called, report they have been trying to get in touch with pts daughter.  ?

## 2021-04-06 NOTE — ED Notes (Addendum)
Pt restless and doesn't follow commands. Pt will answer his name but that is all. Pt swinging at staff with arm stating "get away from me, get off of me". Complete linen change performed, pamper placed. Pt has blood coming from meatus, admitt ?

## 2021-04-06 NOTE — ED Notes (Signed)
Neurology at bedside.

## 2021-04-06 NOTE — Assessment & Plan Note (Signed)
-  Patient with multiple recent hospitalizations and falls, dwindling ?-He was discharged on 3/24 and returned overnight with another fall ?-Further investigation led to the diagnosis of acute CVA ?-He is suffering with delirium, agitation ?-With his CVA and h/o afib, he would need ongoing AC; however, he also has a large psoas hematoma and frank penile bleeding ?-Initial plan was for foley catheter insertion and cystogram to r/o trauma to the bladder; radiation cystitis is also a consideration ?-However, after further discussion with family, the patient would prefer a transition to comfort care ?-He will be admitted to [facility] for comfort care and palliative care consult ?-Patient may be a candidate for [facility] or other residential hospice, as he does not appear to be actively dying at this time ?-However, his reserve is likely quite low given his age and overall frailty ?-Comfort care order set utilized ?-Pain control with morphine drip ?

## 2021-04-06 NOTE — ED Notes (Signed)
Pt tx to MRI

## 2021-04-06 NOTE — ED Notes (Signed)
Pt returned from MRI °

## 2021-04-06 NOTE — ED Notes (Signed)
Palliative care at bedside.

## 2021-04-06 NOTE — Consult Note (Addendum)
Neurology Consult H&P ? ?Gaynelle Arabian Kreitzer ?MR# [id number] ?04/06/2021 ? ? ?CC: stroke ? ?History is obtained from: ED staff, patient family and chart. ? ?HPI: [name] is a 88 y.o. male PMHx as reviewed below recently discharged after treated for viral pneumonia found on the floor this morning and yelled for help.  ? ? ?LKW: unclear ?tNK given: No OSW ?IR Thrombectomy No, not indicated ?Modified Rankin Scale: 1-No significant post stroke disability and can perform usual duties with stroke symptoms ?NIHSS: 15 ? ?LOC Responsiveness 0 ?LOC Questions 2 ?LOC Commands 2 ?Horizontal eye movement 2 ?Visual field 0 ?Facial palsy 2 ?Motor arm - Right arm 2 ?Motor arm - Left arm 2 ?Motor leg - Right leg 1 ?Motor leg - Left leg 1 ?Limb ataxia 0 ?Sensory test 0 ?Language 1 ?Speech 0 ?Extinction and inattention 0 ? ? ?ROS: Unable to assess due to encephalopathy. ? ?Past Medical History:  ?Diagnosis Date  ? Allergic rhinitis   ? Arthritis   ? arthritis-kyphosis  ? Asthmatic bronchitis   ? CAD in native artery   ? a. STEMI 07/2017 s/p DES to LCx (25% mRCA and 70% oOM1 residual).  ? CKD (chronic kidney disease) stage 3, GFR 30-59 ml/min (HCC)   ? DM type 2 (diabetes mellitus, type 2) ([place])   ? DVT, lower extremity, proximal, acute ([place])   ? Dyslipidemia   ? GERD (gastroesophageal reflux disease)   ? Hearing impaired   ? bilateral hearing aids  ? History of kidney stones   ? History of skin cancer   ? Hypertension   ? Hypothyroidism   ? Macular degeneration   ? injections done bilaterally recent - 08-22-13  ? Normocytic anemia   ? PAF (paroxysmal atrial fibrillation) ([place])   ? Parkinson's disease ([place])   ? Prostate cancer ([place])   ? Prostate cancer-radiation only,skin cancer-basal cell and last squamous cell right eye  ? Statin intolerance   ? Stroke [facility])   ? Urgency-frequency syndrome   ? Urticaria   ? ?Family History  ?Problem Relation Age of Onset  ? Other Sister   ?     ophth disease  ? Heart attack Father   ?     x7  ? Heart  disease Father   ?     MI x7  ? Hypertension Mother   ? Stroke Mother 31  ? Amblyopia Neg Hx   ? Blindness Neg Hx   ? Glaucoma Neg Hx   ? Macular degeneration Neg Hx   ? Retinal detachment Neg Hx   ? Strabismus Neg Hx   ? Retinitis pigmentosa Neg Hx   ? Cataracts Neg Hx   ? ? ?Social History:  reports that he has never smoked. He has never used smokeless tobacco. He reports that he does not drink alcohol and does not use drugs. ? ? ?Prior to Admission medications   ?Medication Sig Start Date End Date Taking? Authorizing Provider  ?alfuzosin (UROXATRAL) 10 MG 24 hr tablet Take 1 tablet (10 mg total) by mouth at bedtime. ?Patient taking differently: Take 10 mg by mouth every evening. 02/10/16  Yes Kathie Rhodes, MD  ?amiodarone (PACERONE) 200 MG tablet Take 1 tablet (200 mg total) by mouth daily. 08/17/19  Yes Dwyane Dee, MD  ?apixaban (ELIQUIS) 2.5 MG TABS tablet Take 2.5 mg by mouth 2 (two) times daily.   Yes [provider]  ?brimonidine-timolol (COMBIGAN) 0.2-0.5 % ophthalmic solution Place 1 drop into both eyes 2 (two) times  daily.   Yes [provider]  ?brinzolamide (AZOPT) 1 % ophthalmic suspension Place 1 drop into the right eye 3 (three) times daily.   Yes [provider]  ?carbidopa-levodopa (SINEMET IR) 25-100 MG tablet Take 1 tablet by mouth 3 (three) times daily. 0800, 1400, and 2000   Yes [provider]  ?Carboxymethylcellulose Sodium (ARTIFICIAL TEARS OP) Place 1 drop into both eyes 4 (four) times daily.   Yes [provider]  ?clopidogrel (PLAVIX) 75 MG tablet Take 75 mg by mouth daily.    Yes [provider]  ?docusate sodium (COLACE) 100 MG capsule Take 100 mg by mouth 2 (two) times daily.    Yes [provider]  ?ferrous sulfate 325 (65 FE) MG EC tablet Take 1 tablet (325 mg total) by mouth 2 (two) times daily with a meal. 11/14/18 05/17/21 Yes Danford, Suann Larry, MD  ?fluticasone (FLONASE) 50 MCG/ACT nasal spray Place 2 sprays into  both nostrils daily.   Yes [provider]  ?furosemide (LASIX) 20 MG tablet Take 20 mg by mouth in the morning.   Yes [provider]  ?latanoprost (XALATAN) 0.005 % ophthalmic solution Place 1 drop into the right eye at bedtime.  07/28/16  Yes [provider]  ?levothyroxine (SYNTHROID) 150 MCG tablet Take 150 mcg by mouth daily.    Yes [provider]  ?loratadine (CLARITIN) 10 MG tablet Take 10 mg by mouth at bedtime.   Yes [provider]  ?Melatonin 3 MG SUBL Place 3 mg under the tongue at bedtime. 11/11/18  Yes [provider]  ?Meth-Hyo-M Bl-Na Phos-Ph Sal (URO-MP) 118 MG CAPS Take 1 capsule by mouth 3 (three) times daily.   Yes [provider]  ?metoprolol succinate (TOPROL-XL) 25 MG 24 hr tablet Take 1 tablet (25 mg total) by mouth daily. 08/06/19  Yes Dwyane Dee, MD  ?mirabegron ER (MYRBETRIQ) 50 MG TB24 tablet Take 1 tablet (50 mg total) by mouth daily. 01/28/16  Yes McKenzie, Candee Furbish, MD  ?Multiple Vitamins-Minerals (PRESERVISION/LUTEIN) CAPS Take 1 capsule by mouth 2 (two) times daily.   Yes [provider]  ?pantoprazole (PROTONIX) 40 MG tablet Take 40 mg by mouth 2 (two) times daily. 06/29/19  Yes [provider]  ?polyethylene glycol (MIRALAX / GLYCOLAX) packet Take 17 g by mouth daily.   Yes [provider]  ?potassium chloride SA (KLOR-CON M) 20 MEQ tablet Take 20 mEq by mouth daily. 03/28/21  Yes [provider]  ?QUEtiapine (SEROQUEL) 25 MG tablet Take 1 tablet (25 mg total) by mouth at bedtime. 04/04/21  Yes Dessa Phi, DO  ?sertraline (ZOLOFT) 25 MG tablet Take 25 mg by mouth daily.   Yes [provider]  ?Cholecalciferol (VITAMIN D-3) 5000 units TABS Take 5,000 Units by mouth daily.    [provider]  ?Dextrose, Diabetic Use, (INSTA-GLUCOSE PO) Take 1 Tube by mouth as needed (hypoglycemia or BS <60). ?Patient not taking: Reported on 04/06/2021    [provider]   ?diltiazem (CARDIZEM CD) 120 MG 24 hr capsule Take 1 capsule (120 mg total) by mouth daily for 30 days. ?Patient not taking: Reported on 04/06/2021 03/12/18   Elodia Florence., MD  ?traMADol (ULTRAM) 50 MG tablet Take 1 tablet (50 mg total) by mouth every 6 (six) hours as needed for moderate pain. ?Patient not taking: Reported on 04/06/2021 04/04/21   Dessa Phi, DO  ? ? ?Exam: ?Current vital signs: ?BP 135/84   Pulse (!) 58  Temp 98.8 ?F (37.1 ?C) (Oral)   Resp 16   SpO2 100%  ? ?Physical Exam  ?Constitutional: Appears well-developed and well-nourished.  ?Psych: Affect appropriate to situation ?Eyes: No scleral injection ?HENT: No OP obstruction. ?Head: Normocephalic.  ?Cardiovascular: Normal rate and regular rhythm.  ?Respiratory: Effort normal, symmetric excursions bilaterally, no audible wheezing. ?GI: Soft.  No distension. There is no tenderness.  ?Skin: WDI ? ?Neuro: ?Mental Status: ?Patient is awake and encephalopathic/not oriented. ?Not following commands. ?Patient is able to give a clear and coherent history. ?Speech intermittently fluent, impaired comprehension and impaired repetition. ?No signs of neglect. ?Visual Fields are full. Pupils are equal, round, and reactive to light. ?EOMI mild ptosis on right. No diplopia.  ?Facial sensation is symmetric to temperature ?Facial left lower  weakness.  ?Hearing is intact to voice. ?Uvula midline and palate elevates symmetrically. ?Shoulder shrug is symmetric. ?Tongue is midline without atrophy or fasciculations.  ?Tone is normal. Bulk is normal. Moving all extremities with good strength. ?Sensation is symmetric to noxious stimulus. ?Deep Tendon Reflexes: 2+ and symmetric in the biceps and patellae. ?Toes are downgoing bilaterally. ?FNF and HKS not able to follow commands ?Gait - Deferred ? ?I have reviewed labs in epic and the pertinent results are: ?A1c 6.4 ?LDL 140 ? ?I have reviewed the images obtained: ?MRI brain showed 2 faint areas of diffusion  hyperintensity in the left corona radiata/frontal white matter, not well assessed on ADC map due to motion. Advanced generalized brain atrophy. Mild chronic small vessel ischemic gliosis in the white matter. S

## 2021-04-06 NOTE — H&P (Signed)
?History and Physical  ? ? ?Patient: [name] DOB: Jun 16, 1931 ?DOA: 03/20/2021 ?DOS: the patient was seen and examined on 04/06/2021 ?PCP: Patient, No Pcp Per (Inactive)  ?Patient coming from: ALF/ILF - [facility]; NOK: Daughter, Lucita Ferrara, [phone] ? ? ?Chief Complaint: Fall ? ?HPI: [name] is a 88 y.o. male with medical history significant of CAD; DM; stage 3 CKD; HTN; HLD; hypothyroidism; PAF; Parkinson's; and prostate CA.  He was admitted from 3/9-13 from ALF with multifocal PNA, respiratory panel + for rhinovirus; due to low suspicion for bacterial source he was discharged with just prednisone.  He was readmitted from 3/14-24 with HCAP and treated with 5 days of IV antibiotics.  He was recommended to have outpatient palliative care evaluation and discharged back to ALF. He presented today with a fall.  He is unable to provide history. ? ?I spoke with his daughter, who lives in [place].  He has an elderly sister who lives nearby.  After his last hospitalization, his sister was asked to get a new bed because "that was causing the falls."  Friday he was supposedly doing well.  No one called her until he got to the ER and no one called.  She just found out he is being admitted.  His last visit was because of a fall too.  [facility] said it is too much, combative and he is not appropriate for their facility anymore.  They had not noticed issues with his mouth.  He has been going through a downhill spiral, 4 falls in less than 2 weeks.  He does not want to be kept on any life support, feeding, artificially sustained in any way.  She thinks he would rather be comfortable at this time.   ? ? ? ?ER Course:  CVA, needs echo to finish stroke work-up.  He will need lipid lowering, already on Plavix/Eliquis.  He has mild L facial droop, no other obvious deficits.  Discharged Friday, fell last night.  Gross hematuria. ? ? ? ? ?Review of Systems: unable to review all systems due to the  inability of the patient to answer questions. ?Past Medical History:  ?Diagnosis Date  ? Allergic rhinitis   ? Arthritis   ? arthritis-kyphosis  ? Asthmatic bronchitis   ? CAD in native artery   ? a. STEMI 07/2017 s/p DES to LCx (25% mRCA and 70% oOM1 residual).  ? CKD (chronic kidney disease) stage 3, GFR 30-59 ml/min (HCC)   ? DM type 2 (diabetes mellitus, type 2) ([place])   ? DVT, lower extremity, proximal, acute ([place])   ? Dyslipidemia   ? GERD (gastroesophageal reflux disease)   ? Hearing impaired   ? bilateral hearing aids  ? History of kidney stones   ? History of skin cancer   ? Hypertension   ? Hypothyroidism   ? Macular degeneration   ? injections done bilaterally recent - 08-22-13  ? Normocytic anemia   ? PAF (paroxysmal atrial fibrillation) ([place])   ? Parkinson's disease ([place])   ? Prostate cancer ([place])   ? Prostate cancer-radiation only,skin cancer-basal cell and last squamous cell right eye  ? Statin intolerance   ? Stroke [facility])   ? Urgency-frequency syndrome   ? Urticaria   ? ?Past Surgical History:  ?Procedure Laterality Date  ? BACK SURGERY    ? lumbar fracture  ? cataract surgery Bilateral   ? COLONOSCOPY W/ POLYPECTOMY    ? x2   ? CORONARY STENT INTERVENTION  N/A 08/06/2017  ? Procedure: CORONARY STENT INTERVENTION;  Surgeon: Jettie Booze, MD;  Location: [facility];  Service: Cardiovascular;  Laterality: N/A;  ? CYSTOSCOPY W/ URETERAL STENT PLACEMENT Right 08/13/2017  ? Procedure: CYSTOSCOPY WITH RIGHT  RETROGRADE PYELOGRAM/URETERAL STENT PLACEMENT;  Surgeon: Raynelle Bring, MD;  Location: [facility];  Service: Urology;  Laterality: Right;  ? CYSTOSCOPY WITH INJECTION N/A 09/11/2013  ? Procedure: CYSTOSCOPY WITH BOTOX INJECTION INTO THE BLADDER;  Surgeon: Ailene Rud, MD;  Location: [facility];  Service: Urology;  Laterality: N/A;  ? CYSTOSCOPY WITH RETROGRADE PYELOGRAM, URETEROSCOPY AND STENT PLACEMENT    ? CYSTOSCOPY WITH RETROGRADE PYELOGRAM, URETEROSCOPY AND STENT PLACEMENT Right  12/22/2018  ? Procedure: CYSTOSCOPY WITH RETROGRADE PYELOGRAM, URETEROSCOPY AND STENT PLACEMENT;  Surgeon: Cleon Gustin, MD;  Location: [facility];  Service: Urology;  Laterality: Right;  90 MINS  ? CYSTOSCOPY WITH STENT PLACEMENT Right 01/26/2016  ? Procedure: CYSTOSCOPY, RIGHT RETROGRADE WITH RIGHT URETERAL STENT PLACEMENT;  Surgeon: Cleon Gustin, MD;  Location: [facility];  Service: Urology;  Laterality: Right;  ? CYSTOSCOPY/RETROGRADE/URETEROSCOPY Right 04/22/2016  ? Procedure: CYSTOSCOPY/RETROGRADE/ REMOVAL JJ STENT right insertion stent right insertion of foley;  Surgeon: Franchot Gallo, MD;  Location: [facility];  Service: Urology;  Laterality: Right;  ? EXTRACORPOREAL SHOCK WAVE LITHOTRIPSY Right 02/27/2016  ? Procedure: RIGHT EXTRACORPOREAL SHOCK WAVE LITHOTRIPSY (ESWL) GAITED;  Surgeon: Cleon Gustin, MD;  Location: [facility];  Service: Urology;  Laterality: Right;  ? EXTRACORPOREAL SHOCK WAVE LITHOTRIPSY Right 02/10/2016  ? Procedure: EXTRACORPOREAL SHOCK WAVE LITHOTRIPSY (ESWL);  Surgeon: Kathie Rhodes, MD;  Location: [facility];  Service: Urology;  Laterality: Right;  [place] [phone] ?[id number] ?[employer]- [id number] ?  ? EYE SURGERY    ? macular degeneration  ? GALLBLADDER SURGERY  2006  ? no cholecystectomy  ? HERNIA REPAIR    ? HOLMIUM LASER APPLICATION Right 56/86/1683  ? Procedure: HOLMIUM LASER APPLICATION;  Surgeon: Cleon Gustin, MD;  Location: [facility];  Service: Urology;  Laterality: Right;  ? LEFT HEART CATH AND CORONARY ANGIOGRAPHY N/A 08/06/2017  ? Procedure: LEFT HEART CATH AND CORONARY ANGIOGRAPHY;  Surgeon: Jettie Booze, MD;  Location: [facility];  Service: Cardiovascular;  Laterality: N/A;  ? LUNG REMOVAL, PARTIAL  age 39  ? for bronchiectasis  ? PARTIAL THYMECTOMY  1985  ? for nodules  ? POPLITEAL SYNOVIAL CYST EXCISION  2005  ? TOTAL KNEE ARTHROPLASTY  2008  ? right  ? TOTAL KNEE ARTHROPLASTY  2010  ? left  ? ?Social History:  reports that he  has never smoked. He has never used smokeless tobacco. He reports that he does not drink alcohol and does not use drugs. ? ?Allergies  ?Allergen Reactions  ? Colchicine Anaphylaxis  ? Hibiclens [Chlorhexidine] Hives  ? Atorvastatin Other (See Comments)  ?  Joint pain   ? Celebrex [Celecoxib] Swelling  ? Codeine Hives  ? Erythromycin Diarrhea  ? Ezetimibe Other (See Comments)  ?  Joint pain   ? Iodinated Contrast Media Nausea And Vomiting and Rash  ? Oxycodone-Acetaminophen Hives  ? Simvastatin Other (See Comments)  ?  Joint pain   ? Crestor [Rosuvastatin] Other (See Comments)  ?  Joint pain  ? Erythromycin Other (See Comments)  ?  Listed on MAR  ? Hydroxychloroquine Other (See Comments)  ?  Listed on MAR  ? Nitroglycerin Other (See Comments)  ?  Listed on MAR  ? Protonix [Pantoprazole] Other (See Comments)  ?  unknown  ?  Tetanus Toxoids Other (See Comments)  ?  Listed on MAR  ? Cefpodoxime Rash  ? Cefuroxime Axetil Diarrhea and Rash  ? Nitroglycerin Other (See Comments)  ?  Lowers patient's B/P  ? Vantin Other (See Comments)  ?  Unknown  ? ? ?Family History  ?Problem Relation Age of Onset  ? Other Sister   ?     ophth disease  ? Heart attack Father   ?     x7  ? Heart disease Father   ?     MI x7  ? Hypertension Mother   ? Stroke Mother 71  ? Amblyopia Neg Hx   ? Blindness Neg Hx   ? Glaucoma Neg Hx   ? Macular degeneration Neg Hx   ? Retinal detachment Neg Hx   ? Strabismus Neg Hx   ? Retinitis pigmentosa Neg Hx   ? Cataracts Neg Hx   ? ? ?Prior to Admission medications   ?Medication Sig Start Date End Date Taking? Authorizing Provider  ?alfuzosin (UROXATRAL) 10 MG 24 hr tablet Take 1 tablet (10 mg total) by mouth at bedtime. ?Patient taking differently: Take 10 mg by mouth every evening. 02/10/16  Yes Kathie Rhodes, MD  ?amiodarone (PACERONE) 200 MG tablet Take 1 tablet (200 mg total) by mouth daily. 08/17/19  Yes Dwyane Dee, MD  ?apixaban (ELIQUIS) 2.5 MG TABS tablet Take 2.5 mg by mouth 2 (two) times daily.    Yes [provider]  ?brimonidine-timolol (COMBIGAN) 0.2-0.5 % ophthalmic solution Place 1 drop into both eyes 2 (two) times daily.   Yes [provider]  ?brinzolamide (AZOPT) 1 % ophthalmic suspen

## 2021-04-06 NOTE — Consult Note (Signed)
?Consultation Note ?Date: 04/06/2021  ? ?Patient Name: [name]  ?DOB: January 27, 1931  MRN: [id number]  Age / Sex: 88 y.o., male  ?PCP: Patient, No Pcp Per (Inactive) ?Referring Physician: Karmen Bongo, MD ? ?Reason for Consultation: Terminal Care ? ?HPI/Patient Profile: 88 y.o. male  with past medical history of CAD, DM, stage 3 CKD, HTN, HLD, hypothyroidism, PAF, Parkinson's disease, and prostate cancer presented to ED on 04/06/2021 from SNF status post unwitnessed fall.  After patient was found on the floor it was noted he had mild facial droop and was mildly altered. Patient has had multiple falls in the last several days and was found to have CVA during ED work-up as well as hematuria.  Of note, patient has had 2 inpatient admissions in the last 2 weeks: 3/9-13 for multifocal pneumonia and 3/14-24 for HCAP.  After admitting provider spoke with patient's daughter/HCPOA it was decided to focus on the patient's comfort for end-of-life care.  Admitted on 03/31/2021 for end-of-life care.  ? ?ER Course:  CVA, needs echo to finish stroke work-up.  He will need lipid lowering, already on Plavix/Eliquis.  He has mild L facial droop, no other obvious deficits.  Discharged Friday, fell last night.  Gross hematuria. ? ?Clinical Assessment and Goals of Care: ?I have reviewed medical records including EPIC notes, labs, and imaging. Received report from primary RN -acute concern of patient's restlessness/agitation.  RN reports morphine drip has just been started and Ativan has been given. ? ?Went to visit patient at bedside -no family/visitors present. Patient was lying in bed -he does not respond to voice/gentle touch, his eyes remain closed, and he is intermittently restless and reaching for the air. No signs or non-verbal gestures of pain or discomfort noted. No respiratory distress or increased work of breathing; secretions noted.  ? ?Noted  patient has received several fluid boluses since arriving to ED likely contributing to respiratory secretions - will schedule Robinul. ? ?Discussed symptom management plan with RN. ? ?Primary Decision Maker: ?NEXT OF KIN/HCPOA -daughter ?  ? ?SUMMARY OF RECOMMENDATIONS   ?Continue full comfort measures ?Patient is not stable for transfer to residential hospice at this time; however, once symptoms become better managed if he remains stable can consider - may become hospital death ?Continue morphine drip ?Robinul 0.4 mg IV once now, scheduled 0.2 mg IV every 6 hours ?Anticipate once morphine drip is fully in effect scheduled Ativan will likely not be needed, but pending symptoms can also consider starting - will keep PRN for now but adjusted from every 4 hours to every 1 hour ?Adjusted Haldol to 2 mg every 6 hours as needed for agitation/delirium ?Discontinued oral medications as patient is no longer able to tolerate ?Remove mitts per care order instruction ?PMT will continue to follow and support holistically ? ? ?Code Status/Advance Care Planning: ?DNR ? ?Palliative Prophylaxis:  ?Aspiration, Bowel Regimen, Delirium Protocol, Frequent Pain Assessment, Oral Care, and Turn Reposition ? ?Additional Recommendations (Limitations, Scope, Preferences): ?Full Comfort Care ? ?Psycho-social/Spiritual:  ?Desire for further Chaplaincy support:no ?Created space and opportunity for patient to express thoughts and feelings regarding patient's current medical situation.  ?Emotional support and therapeutic listening provided. ? ?Prognosis:  ?Hours - Days ? ?Discharge Planning: Hospital death versus residential hospice ? ?  ? ?Primary Diagnoses: ?Present on Admission: ? Acute CVA (cerebrovascular accident) ([place]) ? Fall at nursing home ? Psoas hematoma, left, secondary to anticoagulant therapy ? Penile bleeding ? Delirium ? ? ?I have reviewed the medical record, interviewed  the patient and family, and examined the patient. The  following aspects are pertinent. ? ?Past Medical History:  ?Diagnosis Date  ? Allergic rhinitis   ? Arthritis   ? arthritis-kyphosis  ? Asthmatic bronchitis   ? CAD in native artery   ? a. STEMI 07/2017 s/p DES to LCx (25% mRCA and 70% oOM1 residual).  ? CKD (chronic kidney disease) stage 3, GFR 30-59 ml/min (HCC)   ? DM type 2 (diabetes mellitus, type 2) ([place])   ? DVT, lower extremity, proximal, acute ([place])   ? Dyslipidemia   ? GERD (gastroesophageal reflux disease)   ? Hearing impaired   ? bilateral hearing aids  ? History of kidney stones   ? History of skin cancer   ? Hypertension   ? Hypothyroidism   ? Macular degeneration   ? injections done bilaterally recent - 08-22-13  ? Normocytic anemia   ? PAF (paroxysmal atrial fibrillation) ([place])   ? Parkinson's disease ([place])   ? Prostate cancer ([place])   ? Prostate cancer-radiation only,skin cancer-basal cell and last squamous cell right eye  ? Statin intolerance   ? Stroke [facility])   ? Urgency-frequency syndrome   ? Urticaria   ? ?Social History  ? ?Socioeconomic History  ? Marital status: Married  ?  Spouse name: Not on file  ? Number of children: 2  ? Years of education: Not on file  ? Highest education level: Not on file  ?Occupational History  ? Occupation: retired  ?Tobacco Use  ? Smoking status: Never  ? Smokeless tobacco: Never  ?Substance and Sexual Activity  ? Alcohol use: No  ? Drug use: No  ? Sexual activity: Not Currently  ?Other Topics Concern  ? Not on file  ?Social History Narrative  ? ** Merged History Encounter **  ?    ? MUST HAVE MEDS LISTED DO NOT SUBSTITUTE GENERICS PATIENT IS ALLERGIC TO DYES  ? ?Social Determinants of Health  ? ?Financial Resource Strain: Not on file  ?Food Insecurity: Not on file  ?Transportation Needs: Not on file  ?Physical Activity: Not on file  ?Stress: Not on file  ?Social Connections: Not on file  ? ?Family History  ?Problem Relation Age of Onset  ? Other Sister   ?     ophth disease  ? Heart attack Father   ?     x7  ? Heart  disease Father   ?     MI x7  ? Hypertension Mother   ? Stroke Mother 65  ? Amblyopia Neg Hx   ? Blindness Neg Hx   ? Glaucoma Neg Hx   ? Macular degeneration Neg Hx   ? Retinal detachment Neg Hx   ? Strabismus Neg Hx   ? Retinitis pigmentosa Neg Hx   ? Cataracts Neg Hx   ? ?Scheduled Meds: ? antiseptic oral rinse  15 mL Topical BID  ? glycopyrrolate  0.2 mg Intravenous Q6H  ? ?Continuous Infusions: ? morphine 5 mg/hr (04/06/21 1800)  ? ?PRN Meds:.acetaminophen **OR** acetaminophen, diphenhydrAMINE, glycopyrrolate **OR** glycopyrrolate **OR** glycopyrrolate, haloperidol **OR** haloperidol **OR** haloperidol lactate, LORazepam **OR** [DISCONTINUED] LORazepam **OR** LORazepam, morphine, ondansetron **OR** ondansetron (ZOFRAN) IV, polyvinyl alcohol ?Medications Prior to Admission:  ?Prior to Admission medications   ?Medication Sig Start Date End Date Taking? Authorizing Provider  ?alfuzosin (UROXATRAL) 10 MG 24 hr tablet Take 1 tablet (10 mg total) by mouth at bedtime. ?Patient taking differently: Take 10 mg by mouth every evening. 02/10/16  Yes Kathie Rhodes,  MD  ?amiodarone (PACERONE) 200 MG tablet Take 1 tablet (200 mg total) by mouth daily. 08/17/19  Yes Dwyane Dee, MD  ?apixaban (ELIQUIS) 2.5 MG TABS tablet Take 2.5 mg by mouth 2 (two) times daily.   Yes [provider]  ?brimonidine-timolol (COMBIGAN) 0.2-0.5 % ophthalmic solution Place 1 drop into both eyes 2 (two) times daily.   Yes [provider]  ?brinzolamide (AZOPT) 1 % ophthalmic suspension Place 1 drop into the right eye 3 (three) times daily.   Yes [provider]  ?carbidopa-levodopa (SINEMET IR) 25-100 MG tablet Take 1 tablet by mouth 3 (three) times daily. 0800, 1400, and 2000   Yes [provider]  ?Carboxymethylcellulose Sodium (ARTIFICIAL TEARS OP) Place 1 drop into both eyes 4 (four) times daily.   Yes [provider]  ?clopidogrel (PLAVIX) 75 MG tablet Take 75 mg by mouth daily.    Yes [provider]  ?docusate sodium (COLACE) 100 MG capsule Take 100 mg by mouth 2 (two) times daily.    Yes [provider]  ?ferrous sulfate 325 (65 FE) MG EC tablet Take 1 tablet (325 mg total) by mouth 2

## 2021-04-06 NOTE — Progress Notes (Addendum)
Clarification of documentation time; 1645 Documentation done in error.  This rn received pt from ED at [facility]. Morphine drip at 5 mg/hr at 5 ml/hr. Pt slightly agitated with hygiene/nursing care. Staff able to console pt. ?

## 2021-04-12 NOTE — Progress Notes (Signed)
Attempted to call daughter on cell no answer. Patient is rapidly declining and wanted her to be aware of his condition.  ?

## 2021-04-12 NOTE — Progress Notes (Signed)
Spoke to [employer] at [place] at [phone] patient is not a candidate to donate.  ?

## 2021-04-12 NOTE — Progress Notes (Signed)
Spoke with daughter, Amy. Obtained funeral home info. Will transfer pt to morgue. Pt has 2 gold rings placed inside biohazard bag and inside body bag. ?

## 2021-04-12 NOTE — Progress Notes (Signed)
Daughter, Amy, returned call. She will call around 0945 with info on funeral home. ?

## 2021-04-12 NOTE — Progress Notes (Signed)
Attempted again left voicemail.  ?

## 2021-04-12 NOTE — Progress Notes (Signed)
Called Daughter Amy Rayetta Pigg, no answer voicemail left.  ?

## 2021-04-12 NOTE — Death Summary Note (Signed)
? ?DEATH SUMMARY  ? ?Patient Details  ?Name: [name] ?MRN: [id number] ?DOB: [birth year] ?HQI:ONGEXBM, No Pcp Per (Inactive) ?Admission/Discharge Information  ? ?Admit Date:  04/08/2021  ?Date of Death: Date of Death: Apr 09, 2021  ?Time of Death: Time of Death: 0610  ?Length of Stay: 1  ? ?Principle Cause of death: Stroke ? ?Hospital Diagnoses: ?Principal Problem: ?  Admission for end of life care ?Active Problems: ?  Acute CVA (cerebrovascular accident) ([place]) ?  Delirium ?  Fall at nursing home ?  Psoas hematoma, left, secondary to anticoagulant therapy ?  Penile bleeding ?  Acute metabolic encephalopathy ?  Coronary artery disease ?  Diabetes ?  Hypothyroidism ?  Parkinson's diseae ?  Prostate Cancer ?  Asthma ?  Paroxysmal atrial fibrillation ? ?Hospital Course: ?Patient sent from skilled nursing facility with new facial droop and change in mental status.   ? ?Found here to have new stroke.  This was complicated by acute metabolic encephalopathy and delirium due to cerebral edema in the setting of dementia.  His other underlying conditions of atrial fibrillation, psoas hematoma, asthma, coronary artery disease, diabetes, Parkinson's disease, hypothyroidism, hypertension, and prostate cancer made the likelihood of recovery of a satisfactory quality of life low and family believed he would have wished to be treated with dignity, respect and comfort and so he was admitted for end of life care and expired overnight. ? ? ? ?  ? ? ? ?  ? ? ?Procedures: None ? ?Consultations: Palliative Care ? ?The results of significant diagnostics from this hospitalization (including imaging, microbiology, ancillary and laboratory) are listed below for reference.  ? ?Significant Diagnostic Studies: ?CT ABDOMEN PELVIS WO CONTRAST ? ?Result Date: 04/06/2021 ?CLINICAL DATA:  Abdominal trauma, gross hematuria, flank hematoma EXAM: CT ABDOMEN AND PELVIS WITHOUT CONTRAST TECHNIQUE: Multidetector CT imaging of the abdomen and pelvis was  performed following the standard protocol without IV contrast. RADIATION DOSE REDUCTION: This exam was performed according to the departmental dose-optimization program which includes automated exposure control, adjustment of the mA and/or kV according to patient size and/or use of iterative reconstruction technique. COMPARISON:  03/20/2021 FINDINGS: Lower chest: Cardiomegaly and coronary artery calcifications. Moderate bilateral pleural effusions and associated atelectasis or consolidation. Large hiatal hernia with intrathoracic position of the gastric body and fundus. Hepatobiliary: No solid liver abnormality is seen. Faintly calcified gallstones in the gallbladder. Gallbladder wall thickening, or biliary dilatation. Pancreas: Numerous cystic lesions throughout the pancreas. No pancreatic ductal dilatation or surrounding inflammatory changes. Spleen: Normal in size without significant abnormality. Adrenals/Urinary Tract: Adrenal glands are unremarkable. Multiple bilateral exophytic renal cysts. Small nonobstructive bilateral renal calculi. Bladder is unremarkable. Stomach/Bowel: Stomach is within normal limits. Appendix is not clearly visualized and may be surgically absent. No evidence of bowel wall thickening, distention, or inflammatory changes. Large burden of stool throughout the colon. Vascular/Lymphatic: Aortic atherosclerosis. No enlarged abdominal or pelvic lymph nodes. Reproductive: No mass or other significant abnormality. Other: Anasarca.  Small volume fluid in the low pelvis.  No ascites. Musculoskeletal: There is a new, heterogeneous hematoma of the left psoas measuring approximately 8.0 x 5.1 cm (series 3, image 48). No acute or significant osseous findings. Nonacute wedge deformity of the L1 vertebral body. IMPRESSION: 1. New, heterogeneous intramuscular hematoma of the left psoas measuring approximately 8.0 x 5.1 cm. 2. Moderate bilateral pleural effusions and associated atelectasis or  consolidation. 3. Anasarca and small volume ascites. 4. Cholelithiasis without evidence of acute cholecystitis. 5. Small nonobstructive bilateral renal calculi. 6. Numerous  cystic lesions throughout the pancreas, incompletely characterized although consistent with IPMNs or pseudocysts. Although almost certainly benign, these could be further characterized by MRI on a nonemergent, outpatient basis if clinically appropriate. 7. Coronary artery disease. Aortic Atherosclerosis (ICD10-I70.0). Electronically Signed   By: Delanna Ahmadi M.D.   On: 04/06/2021 15:30  ? ?DG Chest 1 View ? ?Result Date: 03/20/2021 ?CLINICAL DATA:  CT of flank and back pain. EXAM: CHEST  1 VIEW COMPARISON:  07/05/2020 FINDINGS: Upper normal heart size. Retrocardiac hiatal hernia. Airspace opacity at the right lung base. There also patchy opacities at the left lung base this is superimposed on chronic interstitial changes in the periphery of the left lung base. Limited assessment for pleural effusion. No pneumothorax. The bones are diffusely under mineralized. The left seventh posterior rib is poorly visualized which is nonspecific. IMPRESSION: 1. Bibasilar airspace opacities, right greater than left, suspicious for pneumonia, including aspiration. 2. Background left lung base scarring. 3. Retrocardiac hiatal hernia. 4. The left posterior seventh rib is poorly visualized, nonspecific. This is likely due to overlapping structures, although the possibility of rib destruction is difficult to exclude in the setting of left flank pain. Electronically Signed   By: Keith Rake M.D.   On: 03/20/2021 18:11  ? ?DG Thoracic Spine 2 View ? ?Result Date: 04/06/2021 ?CLINICAL DATA:  Status post fall. EXAM: THORACIC SPINE 2 VIEWS COMPARISON:  None. FINDINGS: An ill-defined fracture deformity is suspected along the superior endplate of the K81 vertebral body. A chronic fracture of the superior endplate of the L1 vertebral body is noted. Alignment is normal.  Moderate to marked severity multilevel endplate sclerosis and anterior osteophyte formation is seen with mild multilevel intervertebral disc space narrowing. IMPRESSION: 1. Suspect acute fracture deformity along the superior endplate of the E75 vertebral body. MRI correlation is recommended. 2. Chronic fracture of the superior endplate of the L1 vertebral body. 3. Moderate to marked severity multilevel degenerative changes. Electronically Signed   By: Virgina Norfolk M.D.   On: 04/06/2021 00:12  ? ?DG Lumbar Spine 2-3 Views ? ?Result Date: 04/06/2021 ?CLINICAL DATA:  Status post fall. EXAM: LUMBAR SPINE - 2-3 VIEW COMPARISON:  May 13, 2015 FINDINGS: There is no evidence of an acute lumbar spine fracture. A chronic fracture deformity is seen involving the superior endplate of the L1 vertebral body. Approximately 2 mm retrolisthesis of the L2 on L3 and L3 on L4. Marked severity endplate sclerosis is seen at the levels of L2-L3, L3-L4 and L4-L5. Marked severity intervertebral disc space narrowing is also seen at these levels. IMPRESSION: 1. No evidence of acute lumbar spine fracture. 2. Chronic fracture deformity of the superior endplate of the L1 vertebral body. Electronically Signed   By: Virgina Norfolk M.D.   On: 04/06/2021 00:09  ? ?DG Knee 2 Views Left ? ?Result Date: 03/20/2021 ?CLINICAL DATA:  Fall injury. EXAM: LEFT KNEE - 1-2 VIEW COMPARISON:  Study of 09/14/2019. FINDINGS: Osteopenia. No joint effusion is seen. There are heavy vascular calcifications. There is a total joint arthroplasty without appreciable loosening. Evidence of fractures is not seen. Enthesopathic spurring of the anterior superior patella is again shown. IMPRESSION: 1. Total knee replacement hardware in place without visible loosening. 2. Osteopenia without evidence of fractures. 3. Heavy vascular calcifications. Electronically Signed   By: Telford Nab M.D.   On: 03/18/2021 23:45  ? ?DG Knee 2 Views Right ? ?Result Date:  04/03/2021 ?CLINICAL DATA:  Recent fall with knee pain, initial encounter EXAM: RIGHT KNEE -  2 VIEW COMPARISON:  None. FINDINGS: Right knee prosthesis is noted. No acute fracture or dislocation is noted. Vascular calcifications are s

## 2021-04-12 NOTE — Progress Notes (Signed)
MD notified that patient expired at 85. [place],RN second nurse to pronounce patients death.  Patients rings are in small clear bag at bedside along with clothes. ?

## 2021-04-12 NOTE — TOC CAGE-AID Note (Signed)
Transition of Care (TOC) - CAGE-AID Screening ? ? ?Patient Details  ?Name: [name] ?MRN: [id number] ?Date of Birth: 05-May-1931 ? ?Transition of Care (TOC) CM/SW Contact:    ?Betina Puckett C Tarpley-Carter, LCSWA ?Phone Number: ?2021/05/06, 12:59 PM ? ? ?Clinical Narrative: ?Pt is unable to participate in Cage Aid. ?Pt is critically ill.  CSW will assess at a better time. ? ?Passenger transport manager, MSW, LCSW-A ?Pronouns:  She/Her/Hers ?[facility] HealthTransitions of Care ?Clinical Social Worker ?Direct Number:  [phone] ?[email or web address] ? ? ? ?CAGE-AID Screening: ?Substance Abuse Screening unable to be completed due to: : Patient unable to participate ? ?  ?  ?  ?  ?  ? ?Substance Abuse Education Offered: No ? ?  ? ? ? ? ? ? ?

## 2021-04-12 DEATH — deceased

## 2021-09-11 ENCOUNTER — Ambulatory Visit (HOSPITAL_COMMUNITY): Payer: Medicare Other | Admitting: Physician Assistant
